# Patient Record
Sex: Female | Born: 1945 | Race: White | Hispanic: No | Marital: Married | Smoking: Never smoker
Health system: Southern US, Community
[De-identification: ages and names within clinical notes are randomized; demographics above are authoritative.]

## PROBLEM LIST (undated history)

## (undated) DIAGNOSIS — Z8719 Personal history of other diseases of the digestive system: Secondary | ICD-10-CM

## (undated) DIAGNOSIS — N183 Chronic kidney disease, stage 3 unspecified: Secondary | ICD-10-CM

## (undated) DIAGNOSIS — I4510 Unspecified right bundle-branch block: Secondary | ICD-10-CM

## (undated) DIAGNOSIS — K5900 Constipation, unspecified: Secondary | ICD-10-CM

## (undated) DIAGNOSIS — S72009A Fracture of unspecified part of neck of unspecified femur, initial encounter for closed fracture: Secondary | ICD-10-CM

## (undated) DIAGNOSIS — Z8639 Personal history of other endocrine, nutritional and metabolic disease: Secondary | ICD-10-CM

## (undated) DIAGNOSIS — F411 Generalized anxiety disorder: Secondary | ICD-10-CM

## (undated) DIAGNOSIS — I10 Essential (primary) hypertension: Secondary | ICD-10-CM

## (undated) DIAGNOSIS — I444 Left anterior fascicular block: Secondary | ICD-10-CM

## (undated) DIAGNOSIS — M1990 Unspecified osteoarthritis, unspecified site: Secondary | ICD-10-CM

## (undated) DIAGNOSIS — H269 Unspecified cataract: Secondary | ICD-10-CM

## (undated) DIAGNOSIS — Z860100 Personal history of colon polyps, unspecified: Secondary | ICD-10-CM

## (undated) DIAGNOSIS — T8859XA Other complications of anesthesia, initial encounter: Secondary | ICD-10-CM

## (undated) DIAGNOSIS — E11319 Type 2 diabetes mellitus with unspecified diabetic retinopathy without macular edema: Secondary | ICD-10-CM

## (undated) DIAGNOSIS — E1143 Type 2 diabetes mellitus with diabetic autonomic (poly)neuropathy: Secondary | ICD-10-CM

## (undated) DIAGNOSIS — I2510 Atherosclerotic heart disease of native coronary artery without angina pectoris: Secondary | ICD-10-CM

## (undated) DIAGNOSIS — E785 Hyperlipidemia, unspecified: Secondary | ICD-10-CM

## (undated) DIAGNOSIS — D631 Anemia in chronic kidney disease: Secondary | ICD-10-CM

## (undated) DIAGNOSIS — N189 Chronic kidney disease, unspecified: Secondary | ICD-10-CM

## (undated) DIAGNOSIS — E039 Hypothyroidism, unspecified: Secondary | ICD-10-CM

## (undated) DIAGNOSIS — K3184 Gastroparesis: Secondary | ICD-10-CM

## (undated) DIAGNOSIS — L89159 Pressure ulcer of sacral region, unspecified stage: Secondary | ICD-10-CM

## (undated) DIAGNOSIS — K603 Anal fistula, unspecified: Secondary | ICD-10-CM

## (undated) DIAGNOSIS — T4145XA Adverse effect of unspecified anesthetic, initial encounter: Secondary | ICD-10-CM

## (undated) DIAGNOSIS — E119 Type 2 diabetes mellitus without complications: Secondary | ICD-10-CM

## (undated) DIAGNOSIS — M1A3791 Chronic gout due to renal impairment, unspecified ankle and foot, with tophus (tophi): Secondary | ICD-10-CM

## (undated) DIAGNOSIS — K5730 Diverticulosis of large intestine without perforation or abscess without bleeding: Secondary | ICD-10-CM

## (undated) DIAGNOSIS — F419 Anxiety disorder, unspecified: Secondary | ICD-10-CM

## (undated) DIAGNOSIS — K219 Gastro-esophageal reflux disease without esophagitis: Secondary | ICD-10-CM

## (undated) HISTORY — DX: Hyperlipidemia, unspecified: E78.5

## (undated) HISTORY — DX: Chronic kidney disease, stage 3 unspecified: N18.30

## (undated) HISTORY — PX: RETINOPATHY SURGERY: SHX765

## (undated) HISTORY — PX: COLONOSCOPY W/ POLYPECTOMY: SHX1380

## (undated) HISTORY — PX: ESOPHAGOGASTRODUODENOSCOPY: SHX1529

## (undated) HISTORY — DX: Atherosclerotic heart disease of native coronary artery without angina pectoris: I25.10

## (undated) HISTORY — DX: Gastro-esophageal reflux disease without esophagitis: K21.9

## (undated) HISTORY — DX: Unspecified cataract: H26.9

## (undated) HISTORY — DX: Essential (primary) hypertension: I10

## (undated) HISTORY — DX: Chronic kidney disease, stage 3 (moderate): N18.3

## (undated) HISTORY — DX: Generalized anxiety disorder: F41.1

## (undated) HISTORY — DX: Fracture of unspecified part of neck of unspecified femur, initial encounter for closed fracture: S72.009A

---

## 1993-05-24 HISTORY — PX: CATARACT EXTRACTION W/ INTRAOCULAR LENS  IMPLANT, BILATERAL: SHX1307

## 2001-05-18 ENCOUNTER — Encounter: Payer: Self-pay | Admitting: Family Medicine

## 2001-05-18 ENCOUNTER — Inpatient Hospital Stay (HOSPITAL_COMMUNITY): Admission: EM | Admit: 2001-05-18 | Discharge: 2001-05-20 | Payer: Self-pay | Admitting: Emergency Medicine

## 2001-05-29 ENCOUNTER — Encounter: Admission: RE | Admit: 2001-05-29 | Discharge: 2001-05-29 | Payer: Self-pay | Admitting: Sports Medicine

## 2003-07-17 ENCOUNTER — Emergency Department (HOSPITAL_COMMUNITY): Admission: EM | Admit: 2003-07-17 | Discharge: 2003-07-17 | Payer: Self-pay | Admitting: Family Medicine

## 2004-12-27 ENCOUNTER — Emergency Department (HOSPITAL_COMMUNITY): Admission: EM | Admit: 2004-12-27 | Discharge: 2004-12-27 | Payer: Self-pay | Admitting: Dietician

## 2004-12-29 ENCOUNTER — Inpatient Hospital Stay (HOSPITAL_COMMUNITY): Admission: EM | Admit: 2004-12-29 | Discharge: 2005-01-04 | Payer: Self-pay | Admitting: Emergency Medicine

## 2004-12-30 ENCOUNTER — Ambulatory Visit: Payer: Self-pay | Admitting: Internal Medicine

## 2004-12-30 HISTORY — PX: CARDIOVASCULAR STRESS TEST: SHX262

## 2005-02-02 ENCOUNTER — Ambulatory Visit: Payer: Self-pay | Admitting: Internal Medicine

## 2005-03-16 ENCOUNTER — Other Ambulatory Visit: Admission: RE | Admit: 2005-03-16 | Discharge: 2005-03-16 | Payer: Self-pay | Admitting: Family Medicine

## 2005-04-07 ENCOUNTER — Ambulatory Visit: Payer: Self-pay | Admitting: Cardiology

## 2005-07-09 ENCOUNTER — Encounter: Admission: RE | Admit: 2005-07-09 | Discharge: 2005-07-09 | Payer: Self-pay | Admitting: Nephrology

## 2005-07-19 ENCOUNTER — Ambulatory Visit: Payer: Self-pay | Admitting: Internal Medicine

## 2005-07-19 ENCOUNTER — Inpatient Hospital Stay (HOSPITAL_COMMUNITY): Admission: EM | Admit: 2005-07-19 | Discharge: 2005-07-21 | Payer: Self-pay | Admitting: Emergency Medicine

## 2005-07-19 ENCOUNTER — Encounter: Payer: Self-pay | Admitting: Internal Medicine

## 2005-07-28 ENCOUNTER — Emergency Department (HOSPITAL_COMMUNITY): Admission: EM | Admit: 2005-07-28 | Discharge: 2005-07-28 | Payer: Self-pay | Admitting: Emergency Medicine

## 2006-05-05 ENCOUNTER — Encounter (HOSPITAL_COMMUNITY): Admission: RE | Admit: 2006-05-05 | Discharge: 2006-08-03 | Payer: Self-pay | Admitting: *Deleted

## 2006-05-24 HISTORY — PX: COLONOSCOPY: SHX174

## 2006-08-23 ENCOUNTER — Encounter (HOSPITAL_COMMUNITY): Admission: RE | Admit: 2006-08-23 | Discharge: 2006-11-21 | Payer: Self-pay | Admitting: Nephrology

## 2006-09-22 ENCOUNTER — Ambulatory Visit: Payer: Self-pay | Admitting: Internal Medicine

## 2006-10-19 ENCOUNTER — Ambulatory Visit: Payer: Self-pay | Admitting: Internal Medicine

## 2006-11-17 ENCOUNTER — Ambulatory Visit: Payer: Self-pay | Admitting: Internal Medicine

## 2006-11-17 DIAGNOSIS — D126 Benign neoplasm of colon, unspecified: Secondary | ICD-10-CM

## 2006-11-17 DIAGNOSIS — K5730 Diverticulosis of large intestine without perforation or abscess without bleeding: Secondary | ICD-10-CM | POA: Insufficient documentation

## 2006-11-18 ENCOUNTER — Encounter: Payer: Self-pay | Admitting: Internal Medicine

## 2006-12-09 ENCOUNTER — Encounter (HOSPITAL_COMMUNITY): Admission: RE | Admit: 2006-12-09 | Discharge: 2007-03-09 | Payer: Self-pay | Admitting: Nephrology

## 2007-03-24 ENCOUNTER — Encounter (HOSPITAL_COMMUNITY): Admission: RE | Admit: 2007-03-24 | Discharge: 2007-06-22 | Payer: Self-pay | Admitting: Nephrology

## 2007-07-11 ENCOUNTER — Encounter (HOSPITAL_COMMUNITY): Admission: RE | Admit: 2007-07-11 | Discharge: 2007-10-09 | Payer: Self-pay | Admitting: Nephrology

## 2007-10-24 ENCOUNTER — Encounter (HOSPITAL_COMMUNITY): Admission: RE | Admit: 2007-10-24 | Discharge: 2008-01-22 | Payer: Self-pay | Admitting: Nephrology

## 2007-10-28 DIAGNOSIS — I152 Hypertension secondary to endocrine disorders: Secondary | ICD-10-CM | POA: Insufficient documentation

## 2007-10-28 DIAGNOSIS — E119 Type 2 diabetes mellitus without complications: Secondary | ICD-10-CM

## 2007-10-28 DIAGNOSIS — F411 Generalized anxiety disorder: Secondary | ICD-10-CM | POA: Insufficient documentation

## 2007-10-28 DIAGNOSIS — E785 Hyperlipidemia, unspecified: Secondary | ICD-10-CM

## 2007-10-28 DIAGNOSIS — I10 Essential (primary) hypertension: Secondary | ICD-10-CM | POA: Insufficient documentation

## 2007-10-28 DIAGNOSIS — E11319 Type 2 diabetes mellitus with unspecified diabetic retinopathy without macular edema: Secondary | ICD-10-CM

## 2007-10-28 DIAGNOSIS — K922 Gastrointestinal hemorrhage, unspecified: Secondary | ICD-10-CM | POA: Insufficient documentation

## 2007-10-28 DIAGNOSIS — F329 Major depressive disorder, single episode, unspecified: Secondary | ICD-10-CM

## 2007-10-28 DIAGNOSIS — E1159 Type 2 diabetes mellitus with other circulatory complications: Secondary | ICD-10-CM

## 2007-10-28 DIAGNOSIS — Z8719 Personal history of other diseases of the digestive system: Secondary | ICD-10-CM

## 2007-10-28 DIAGNOSIS — E1169 Type 2 diabetes mellitus with other specified complication: Secondary | ICD-10-CM

## 2007-10-28 DIAGNOSIS — I2510 Atherosclerotic heart disease of native coronary artery without angina pectoris: Secondary | ICD-10-CM | POA: Insufficient documentation

## 2007-10-28 DIAGNOSIS — N259 Disorder resulting from impaired renal tubular function, unspecified: Secondary | ICD-10-CM | POA: Insufficient documentation

## 2007-12-28 ENCOUNTER — Inpatient Hospital Stay (HOSPITAL_COMMUNITY): Admission: EM | Admit: 2007-12-28 | Discharge: 2007-12-30 | Payer: Self-pay | Admitting: Emergency Medicine

## 2007-12-28 ENCOUNTER — Ambulatory Visit: Payer: Self-pay | Admitting: Family Medicine

## 2008-01-21 ENCOUNTER — Emergency Department (HOSPITAL_COMMUNITY): Admission: EM | Admit: 2008-01-21 | Discharge: 2008-01-21 | Payer: Self-pay | Admitting: Emergency Medicine

## 2008-01-22 ENCOUNTER — Telehealth: Payer: Self-pay | Admitting: Internal Medicine

## 2008-01-23 ENCOUNTER — Encounter: Payer: Self-pay | Admitting: Gastroenterology

## 2008-01-23 ENCOUNTER — Inpatient Hospital Stay (HOSPITAL_COMMUNITY): Admission: EM | Admit: 2008-01-23 | Discharge: 2008-01-26 | Payer: Self-pay | Admitting: Emergency Medicine

## 2008-01-23 ENCOUNTER — Ambulatory Visit: Payer: Self-pay | Admitting: Gastroenterology

## 2008-02-12 ENCOUNTER — Encounter (HOSPITAL_COMMUNITY): Admission: RE | Admit: 2008-02-12 | Discharge: 2008-05-12 | Payer: Self-pay | Admitting: Nephrology

## 2008-02-14 ENCOUNTER — Ambulatory Visit (HOSPITAL_COMMUNITY): Admission: RE | Admit: 2008-02-14 | Discharge: 2008-02-14 | Payer: Self-pay | Admitting: Family Medicine

## 2008-03-13 ENCOUNTER — Encounter (HOSPITAL_COMMUNITY): Admission: RE | Admit: 2008-03-13 | Discharge: 2008-05-23 | Payer: Self-pay | Admitting: Family Medicine

## 2008-04-08 ENCOUNTER — Ambulatory Visit: Payer: Self-pay

## 2008-05-21 ENCOUNTER — Encounter (HOSPITAL_COMMUNITY): Admission: RE | Admit: 2008-05-21 | Discharge: 2008-05-23 | Payer: Self-pay | Admitting: Nephrology

## 2008-05-24 ENCOUNTER — Encounter (HOSPITAL_COMMUNITY): Admission: RE | Admit: 2008-05-24 | Discharge: 2008-08-22 | Payer: Self-pay | Admitting: Nephrology

## 2008-07-29 ENCOUNTER — Ambulatory Visit (HOSPITAL_COMMUNITY): Admission: RE | Admit: 2008-07-29 | Discharge: 2008-07-29 | Payer: Self-pay | Admitting: Family Medicine

## 2008-09-06 LAB — HM DEXA SCAN

## 2008-09-17 ENCOUNTER — Encounter (HOSPITAL_COMMUNITY): Admission: RE | Admit: 2008-09-17 | Discharge: 2008-12-16 | Payer: Self-pay | Admitting: Nephrology

## 2008-10-08 ENCOUNTER — Emergency Department (HOSPITAL_COMMUNITY): Admission: EM | Admit: 2008-10-08 | Discharge: 2008-10-08 | Payer: Self-pay | Admitting: Emergency Medicine

## 2009-01-07 ENCOUNTER — Encounter (HOSPITAL_COMMUNITY): Admission: RE | Admit: 2009-01-07 | Discharge: 2009-04-07 | Payer: Self-pay | Admitting: Nephrology

## 2009-04-08 ENCOUNTER — Encounter (HOSPITAL_COMMUNITY): Admission: RE | Admit: 2009-04-08 | Discharge: 2009-07-07 | Payer: Self-pay | Admitting: Nephrology

## 2009-07-08 ENCOUNTER — Encounter (HOSPITAL_COMMUNITY): Admission: RE | Admit: 2009-07-08 | Discharge: 2009-10-06 | Payer: Self-pay | Admitting: Nephrology

## 2009-10-14 ENCOUNTER — Encounter (HOSPITAL_COMMUNITY): Admission: RE | Admit: 2009-10-14 | Discharge: 2010-01-12 | Payer: Self-pay | Admitting: Nephrology

## 2010-01-27 ENCOUNTER — Encounter (HOSPITAL_COMMUNITY): Admission: RE | Admit: 2010-01-27 | Discharge: 2010-04-27 | Payer: Self-pay | Admitting: Nephrology

## 2010-05-12 ENCOUNTER — Encounter (HOSPITAL_COMMUNITY)
Admission: RE | Admit: 2010-05-12 | Discharge: 2010-06-23 | Payer: Self-pay | Source: Home / Self Care | Attending: Nephrology | Admitting: Nephrology

## 2010-06-08 LAB — POCT HEMOGLOBIN-HEMACUE: Hemoglobin: 11.1 g/dL — ABNORMAL LOW (ref 12.0–15.0)

## 2010-06-08 LAB — IRON AND TIBC
Iron: 53 ug/dL (ref 42–135)
Saturation Ratios: 20 % (ref 20–55)
TIBC: 269 ug/dL (ref 250–470)
UIBC: 216 ug/dL

## 2010-06-08 LAB — FERRITIN: Ferritin: 222 ng/mL (ref 10–291)

## 2010-07-14 ENCOUNTER — Encounter (HOSPITAL_COMMUNITY)
Admission: RE | Admit: 2010-07-14 | Discharge: 2010-07-14 | Disposition: A | Discharge status: Home / Self Care | Payer: Medicare Other | Source: Ambulatory Visit | Attending: Nephrology | Admitting: Nephrology

## 2010-07-14 ENCOUNTER — Other Ambulatory Visit: Payer: Self-pay

## 2010-07-14 DIAGNOSIS — D638 Anemia in other chronic diseases classified elsewhere: Secondary | ICD-10-CM | POA: Insufficient documentation

## 2010-07-14 DIAGNOSIS — N183 Chronic kidney disease, stage 3 unspecified: Secondary | ICD-10-CM | POA: Insufficient documentation

## 2010-08-03 LAB — POCT HEMOGLOBIN-HEMACUE: Hemoglobin: 11.4 g/dL — ABNORMAL LOW (ref 12.0–15.0)

## 2010-08-04 ENCOUNTER — Other Ambulatory Visit: Payer: Self-pay | Admitting: Nephrology

## 2010-08-04 ENCOUNTER — Other Ambulatory Visit: Payer: Self-pay

## 2010-08-04 ENCOUNTER — Encounter (HOSPITAL_COMMUNITY): Payer: Medicare Other | Attending: Nephrology

## 2010-08-04 DIAGNOSIS — N183 Chronic kidney disease, stage 3 unspecified: Secondary | ICD-10-CM | POA: Insufficient documentation

## 2010-08-04 DIAGNOSIS — D638 Anemia in other chronic diseases classified elsewhere: Secondary | ICD-10-CM | POA: Insufficient documentation

## 2010-08-04 LAB — IRON AND TIBC
Iron: 69 ug/dL (ref 42–135)
Saturation Ratios: 22 % (ref 20–55)
Saturation Ratios: 20 % (ref 20–55)
UIBC: 243 ug/dL
UIBC: 243 ug/dL

## 2010-08-05 LAB — POCT HEMOGLOBIN-HEMACUE: Hemoglobin: 10.2 g/dL — ABNORMAL LOW (ref 12.0–15.0)

## 2010-08-06 LAB — POCT HEMOGLOBIN-HEMACUE: Hemoglobin: 10.4 g/dL — ABNORMAL LOW (ref 12.0–15.0)

## 2010-08-06 LAB — IRON AND TIBC: UIBC: 206 ug/dL

## 2010-08-08 LAB — POCT HEMOGLOBIN-HEMACUE: Hemoglobin: 10.1 g/dL — ABNORMAL LOW (ref 12.0–15.0)

## 2010-08-09 LAB — POCT HEMOGLOBIN-HEMACUE: Hemoglobin: 11.8 g/dL — ABNORMAL LOW (ref 12.0–15.0)

## 2010-08-09 LAB — IRON AND TIBC
Iron: 79 ug/dL (ref 42–135)
Saturation Ratios: 25 % (ref 20–55)
TIBC: 306 ug/dL (ref 250–470)
UIBC: 231 ug/dL

## 2010-08-09 LAB — FERRITIN: Ferritin: 64 ng/mL (ref 10–291)

## 2010-08-10 LAB — IRON AND TIBC: Iron: 76 ug/dL (ref 42–135)

## 2010-08-10 LAB — FERRITIN: Ferritin: 267 ng/mL (ref 10–291)

## 2010-08-11 LAB — POCT HEMOGLOBIN-HEMACUE: Hemoglobin: 10.3 g/dL — ABNORMAL LOW (ref 12.0–15.0)

## 2010-08-12 LAB — POCT HEMOGLOBIN-HEMACUE
Hemoglobin: 11.4 g/dL — ABNORMAL LOW (ref 12.0–15.0)
Hemoglobin: 12.3 g/dL (ref 12.0–15.0)

## 2010-08-16 LAB — IRON AND TIBC
Iron: 78 ug/dL (ref 42–135)
UIBC: 228 ug/dL

## 2010-08-16 LAB — FERRITIN: Ferritin: 185 ng/mL (ref 10–291)

## 2010-08-16 LAB — POCT HEMOGLOBIN-HEMACUE: Hemoglobin: 11.7 g/dL — ABNORMAL LOW (ref 12.0–15.0)

## 2010-08-24 LAB — POCT HEMOGLOBIN-HEMACUE: Hemoglobin: 11.1 g/dL — ABNORMAL LOW (ref 12.0–15.0)

## 2010-08-25 ENCOUNTER — Other Ambulatory Visit: Payer: Self-pay | Admitting: Nephrology

## 2010-08-25 ENCOUNTER — Encounter (HOSPITAL_COMMUNITY): Payer: Medicare Other | Attending: Nephrology

## 2010-08-25 DIAGNOSIS — D638 Anemia in other chronic diseases classified elsewhere: Secondary | ICD-10-CM | POA: Insufficient documentation

## 2010-08-25 DIAGNOSIS — N183 Chronic kidney disease, stage 3 unspecified: Secondary | ICD-10-CM | POA: Insufficient documentation

## 2010-08-25 LAB — POCT HEMOGLOBIN-HEMACUE: Hemoglobin: 10.7 g/dL — ABNORMAL LOW (ref 12.0–15.0)

## 2010-08-27 LAB — IRON AND TIBC: TIBC: 327 ug/dL (ref 250–470)

## 2010-08-27 LAB — POCT HEMOGLOBIN-HEMACUE
Hemoglobin: 10.9 g/dL — ABNORMAL LOW (ref 12.0–15.0)
Hemoglobin: 10.7 g/dL — ABNORMAL LOW (ref 12.0–15.0)

## 2010-08-27 LAB — FERRITIN: Ferritin: 111 ng/mL (ref 10–291)

## 2010-08-29 LAB — IRON AND TIBC
Iron: 73 ug/dL (ref 42–135)
TIBC: 321 ug/dL (ref 250–470)

## 2010-08-29 LAB — POCT HEMOGLOBIN-HEMACUE: Hemoglobin: 10.6 g/dL — ABNORMAL LOW (ref 12.0–15.0)

## 2010-08-30 LAB — IRON AND TIBC
Iron: 70 ug/dL (ref 42–135)
Saturation Ratios: 22 % (ref 20–55)
UIBC: 251 ug/dL

## 2010-08-30 LAB — FERRITIN: Ferritin: 108 ng/mL (ref 10–291)

## 2010-08-31 LAB — POCT HEMOGLOBIN-HEMACUE: Hemoglobin: 10.9 g/dL — ABNORMAL LOW (ref 12.0–15.0)

## 2010-09-01 LAB — URINALYSIS, ROUTINE W REFLEX MICROSCOPIC
Glucose, UA: NEGATIVE mg/dL
Nitrite: NEGATIVE
Specific Gravity, Urine: 1.014 (ref 1.005–1.030)
pH: 6.5 (ref 5.0–8.0)

## 2010-09-01 LAB — URINE CULTURE: Colony Count: >=100000

## 2010-09-01 LAB — BASIC METABOLIC PANEL
CO2: 27 mEq/L (ref 19–32)
Calcium: 9.7 mg/dL (ref 8.4–10.5)
Chloride: 107 mEq/L (ref 96–112)
Creatinine, Ser: 1.84 mg/dL — ABNORMAL HIGH (ref 0.4–1.2)
Glucose, Bld: 70 mg/dL (ref 70–99)

## 2010-09-01 LAB — GLUCOSE, CAPILLARY

## 2010-09-01 LAB — URINE MICROSCOPIC-ADD ON

## 2010-09-02 LAB — IRON AND TIBC
Saturation Ratios: 21 % (ref 20–55)
TIBC: 378 ug/dL (ref 250–470)
UIBC: 298 ug/dL

## 2010-09-03 LAB — POCT HEMOGLOBIN-HEMACUE: Hemoglobin: 11.7 g/dL — ABNORMAL LOW (ref 12.0–15.0)

## 2010-09-07 LAB — IRON AND TIBC: Iron: 81 ug/dL (ref 42–135)

## 2010-09-07 LAB — POCT HEMOGLOBIN-HEMACUE: Hemoglobin: 12.5 g/dL (ref 12.0–15.0)

## 2010-09-08 LAB — POCT HEMOGLOBIN-HEMACUE: Hemoglobin: 11.8 g/dL — ABNORMAL LOW (ref 12.0–15.0)

## 2010-09-14 ENCOUNTER — Encounter (HOSPITAL_COMMUNITY): Payer: Medicare Other

## 2010-09-14 ENCOUNTER — Other Ambulatory Visit: Payer: Self-pay | Admitting: Nephrology

## 2010-09-15 ENCOUNTER — Encounter (HOSPITAL_COMMUNITY): Payer: Medicare Other

## 2010-09-15 LAB — POCT HEMOGLOBIN-HEMACUE: Hemoglobin: 11.8 g/dL — ABNORMAL LOW (ref 12.0–15.0)

## 2010-10-05 ENCOUNTER — Other Ambulatory Visit: Payer: Self-pay | Admitting: Nephrology

## 2010-10-05 ENCOUNTER — Encounter (HOSPITAL_COMMUNITY): Payer: Medicare Other | Attending: Nephrology

## 2010-10-05 DIAGNOSIS — N183 Chronic kidney disease, stage 3 unspecified: Secondary | ICD-10-CM | POA: Insufficient documentation

## 2010-10-05 DIAGNOSIS — D638 Anemia in other chronic diseases classified elsewhere: Secondary | ICD-10-CM | POA: Insufficient documentation

## 2010-10-05 LAB — IRON AND TIBC
Iron: 54 ug/dL (ref 42–135)
Saturation Ratios: 18 % — ABNORMAL LOW (ref 20–55)
UIBC: 238 ug/dL

## 2010-10-06 NOTE — Discharge Summary (Signed)
[name], [name] NO.:  [id number]   MEDICAL RECORD NO.:  [id number]          PATIENT TYPE:  INP   LOCATION:  [id number]                         FACILITY:  [facility]   PHYSICIAN:  [name], MDDATE OF BIRTH:  [birth year]   DATE OF ADMISSION:  12/28/2007  DATE OF DISCHARGE:  12/31/2007                               DISCHARGE SUMMARY   PRIMARY CARE PHYSICIAN:  Dr. Elenore Rota [name]/[name], Nurse  Practitioner.   DISCHARGE DISPOSITION:  Home.   FINAL DISCHARGE DIAGNOSES:  1. Upper gastrointestinal bleed.  2. Nausea and vomiting.  3. History of diabetes.  4. Hypertension, poorly controlled.  5. Chronic kidney disease, stage III.  6. History of esophagitis.  7. 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  8. History of diverticulosis.  9. History of anxiety.  8  Urinary tract infection.   DISCHARGE MEDICATIONS:  1. Glimepiride 2 mg p.o.  2. Lorazepam  0.5 mg p.o. q. 8 hours p.r.n.  3. Actos 30 mg p.o. daily.  4. Metoprolol 50 mg p.o. daily.  5. Calcitriol 0.25 mg p.o. daily.  6. Lasix 40 mg p.o. daily.  7. Vytorin 10/40 mg p.o. daily.  8. Clonidine 0.2 mg p.o. b.i.d.  9. Ciprofloxacin 500 mg p.o. b.i.d. x3.   CONSULTANTS:  [facility].   PROCEDURES:  None.   DIAGNOSTIC STUDIES:  Abdominal x-ray done on admission which shows no  acute cardiopulmonary processes.  Fecal distention of the colon.  Mild  gaseous distention of the distal transverse colon.  No pneumoperitoneum  or opaque calculus seen.   CODE STATUS:  Full code.   ALLERGIES:  1. ASPIRIN  2. VIOXX.  3. CODEINE  4. TYLENOL.   CHIEF COMPLAINT:  Nausea and vomiting.   HISTORY OF PRESENT ILLNESS:  Please see the H&P dictated by Dr.  Netty [name] for details of the HPI.  However, in short, the patient  started having emesis after she took her Micardis.  She knows that she  was having coffee-grounds, but no bright red blood per rectum.  The  patient had 20 or  more episodes of emesis and was brought to the  emergency room by her family.   HOSPITAL COURSE:  1. GI bleed:  The patient was given bowel rest.  The patient had no      bright red blood in her emesis, only coffee-ground emesis.  The      patient was given bowel rest and started on Protonix and given      supportive IV fluids.  Gastroenterology saw the patient and      assessed that the patient did not need to have any further      intervention.  The patient had her diet advanced from a clear      liquid to a carb modified diet, which she tolerated without any      difficulty.  2. Hypertension:  The patient had some difficulty with her blood      pressure while her blood pressure medications were initially held      in the hospital.  However, after  initial bowel rest, her blood      pressure medications were resumed and the patient's blood pressure      came under control.  3. Diabetes type 2:  The patient was continued on her usual      medications after she was tolerating a diet and her blood sugars      were under good control.  Otherwise , the patient has remained stable today.   PHYSICAL EXAMINATION:  VITAL SIGNS:  Stable:  Blood pressure is 150/86,  heart rate 60, temperature 98.1, respiratory rate 16, O2 sats are 99% on  room air, blood sugars are ranging from 102 to 117.  HEENT EXAMINATION:  Atraumatic.  Pupils equally round and reactive to  light and accommodation.  Oropharynx is moist.  RESPIRATORY EXAMINATION:  She is clear to auscultation.  ABDOMINAL EXAMINATION:  She is obese, soft, nontender, nondistended.  No  masses or hepatosplenomegaly noted.   DIETARY RESTRICTIONS:  The patient should be on a carb modified 2 gram  sodium diet, low cholesterol diet.   PHYSICAL RESTRICTIONS:  None.   FOLLOW UP:  The patient is to follow up with her primary care physician  in 1 week.  The patient is also to follow up with her nephrologist for  an appointment already  scheduled.      [name] Gamer, MD  Electronically Signed    MAM/MEDQ  D:  12/30/2007  T:  12/30/2007  Job:  [id number]   cc:   [name], M.D.  [name] Mayer, MD,FACG  [name], M.D.

## 2010-10-06 NOTE — Assessment & Plan Note (Signed)
[facility] OFFICE NOTE   NAME:[name], [name]                      MRN:          [id number]  DATE:10/19/2006                            DOB:          May 29, 1945    [name] is a 65 year old white female with a history of rectal  abscess, positive family history of colon cancer in her father, who we  saw two years ago at Dr. Tawanna Sat recommendation for colonoscopy.  At  that time, she had a healing perirectal abscess, and we decided to wait  until the abscess healed.  She has since then had an episode of  intractable nausea and vomiting, attributed most likely to diabetes and  gastroparesis and was hospitalized and had an upper endoscopy on an  emergency basis by Dr. Collene Mares.  She has done quite well now, the rectal  abscess has healed up, and she is interested in having a colonoscopy.   MEDICATIONS:  1. Vytorin 10/40 1 p.o. daily.  2. Clonidine 0.2 mg p.o. b.i.d.  3. Metoprolol 50 mg p.o. daily.  4. Actos 30 mg p.o. daily.  5. Glimepiride 2 mg 1/2 tablet daily.  6. Equate stool softener.  7. MiraLax p.r.n.   PHYSICAL EXAMINATION:  VITAL SIGNS:  Blood pressure 154/90, pulse 68.  Weight 193 pounds.  GENERAL:  Patient was alert and oriented in no distress.  She was  accompanied by her sister.  LUNGS:  Clear to auscultation.  COR:  Normal S1, normal S2.  ABDOMEN:  Protuberant, obese, nontender.  RECTAL:  A completely healed perirectal area with normal rectal tone.   IMPRESSION:  A 65 year old white female with a positive family history  of colon cancer in a direct relative and a personal history of rectal  abscess.  She also has a history of anemia requiring iron infusions by  Dr. Mercy Moore, a history of renal insufficiency, and diabetes.   PLAN:  Colonoscopy is scheduled for the end of June.  Her sister will  bring her.  We discussed prep.  Since she cannot drink a large volume of  laxative, she opted  for OsmoPrep with the water in between.  We have  explained it to her and gave her Phenergan 25 mg to use p.r.n. nausea,  which she could use during the prep.  Patient will discontinue her  diabetic medications on the day of the procedure.    Lowella Bandy. Olevia Perches, MD  Electronically Signed   DMB/MedQ  DD: 10/19/2006  DT: 10/20/2006  Job #: [id number]   cc:   Chipper Herb, M.D.

## 2010-10-06 NOTE — H&P (Signed)
NAMEPERIS, SHAFIQ NO.:  [id number]   MEDICAL RECORD NO.:  [id number]          PATIENT TYPE:  INP   LOCATION:  [id number]                         FACILITY:  [facility]   PHYSICIAN:  Dalbert Mayotte, MD        DATE OF BIRTH:  [birth year]   DATE OF ADMISSION:  12/28/2007  DATE OF DISCHARGE:                              HISTORY & PHYSICAL   PRIMARY CARE Denee Boeder:  Chevis Pretty, [facility].   CHIEF COMPLAINT:  Nausea and vomiting.   HISTORY OF PRESENT ILLNESS:  Ms. Viswanathan is a 65 year old white female  with past medical history significant for Mallory-Weiss tear,  esophagitis, and colon polyps followed by colonoscopy in 2008, who  presents with hematemesis.  She saw her nephrologist Dr. Mercy Moore who  put her on Micardis on Tuesday.  She became nauseated on Wednesday and  discontinued her medication after 2 doses.  She began vomiting on  Thursday at 6 a.m.  She vomited more than 20 times; it was nonbilious,  but she describes the vomitus as coffee brown.  She describes having  similar experience several years ago with enalapril.  She has not had  any medication today and has not taken anything to relieve the nausea  and vomiting.  Of note, she has not had her bowel movements since  Tuesday.  This is typical for her.  She does not describe any fevers,  chills, sweats, or weight change.  She has not had any diarrhea, bright  red blood per rectum, or abdominal pain.  Her ED course; she received  Protonix 40 mg, Zofran 8 mg IV, Reglan 10 mg x1, and Reglan 10 mg IM.   PAST MEDICAL HISTORY:  1. Coronary artery disease.  2. Diabetes mellitus with associated retinopathy.  3. Blood glucose is noted to be usually 93 to 97.  4. GERD.  5. Hypertension, controlled usually in 120s/80s.  6. Colonoscopy, July 2008 showed polyp, 2-mm diminutive sessile      polyps.  7. GI bleed.  8. Esophagitis.  9. Mallory-Weiss tear 5-6 years ago.  10.Diverticulosis.  11.Anxiety.  12.Renal insufficiency.   PAST SURGICAL HISTORY:  Retina surgery numerous times and surgery of  rectal abscess.   SOCIAL HISTORY:  She lives with husband in [place].  She does not  describe any history or use of tobacco, alcohol, or illicit drugs.   FAMILY HISTORY:  Her mother had diabetes mellitus and myocardial  infarction.  Father had colon cancer at age 46 and diabetes mellitus and  myocardial infarction.   MEDICATIONS:  She was on,  1. Glimepiride 2 mg daily.  2. Calcitriol 0.25 mcg daily.  3. Lorazepam 0.5 mg 1 or 2 tabs p.r.n. anxiety.  4. Lasix 40 mg daily.  5. Micardis 20 mg daily.  Micardis started on December 26, 2007.  6. Vytorin 10/40 daily.  7. Toprol ER 50 mg daily.  8. Actos 30 mg daily.   ALLERGIES:  VIOXX.   PHYSICAL EXAMINATION:  VITAL SIGNS:  Temperature max 99.8, pulse 134,  respirations 19, blood pressure 200/96, and pulse ox 99%.  GENERAL:  Alert and oriented in mild distress and vomiting brown mucoid  substance.  HEENT:  Pupils equal, round, and reactive to light.  CARDIOVASCULAR:  Regular rate and rhythm.  No murmurs, rubs, or gallops.  S1 and S2.  LUNGS:  Clear to auscultation bilaterally.  ABDOMEN:  Nontender, nondistended, absent bowel sounds.  BACK:  Without CVA tenderness.  EXTREMITIES:  Without cyanosis, clubbing, or edema.  A 2+ posterior  tibialis pulses bilaterally.   LABS AND STUDIES:  Fecal occult blood test positive.  Point-of-care  cardiac enzymes within normal limits x1.  Lipase 18, BUN 18, creatinine  1.4, AST 29, ALT 15, alk phos 75, white blood cell count 7.7, hemoglobin  13.6, and hematocrit 40.9.  Abdominal series showed fecal distention of  colon and gaseous distention of transverse colon.   ASSESSMENT:  Ms. Stadtmiller is a 65 year old white female with hematemesis.   PROBLEM LIST:  1. Hematemesis could be related to an ulcer past Mallory-Weiss tear      with extensive upper gastrointestinal disease.  Started Protonix      IV,  started Zofran IV.  We will evaluate with CT when fluid status      was improved and improved kidney function.  CBC in the morning, but      she is not acutely anemic; n.p.o. for possible scope and irritable      stomach.  Gastrointestinal consult in the morning.  2. Hypertension; was unable to take antihypertensives today and      arrived with hypertension.  She was given labetalol 40 mg IV x1.      Metoprolol IV start 5 mg IV q.6 h.  We will hold      hydrochlorothiazide, Micardis, and Lasix.  3. Renal insufficiency.  The patient is in acute renal failure      secondary to hypervolemia with no known baseline creatinine.  Start      normal saline 200 mL per hour x2 hours, then 125 mL per hour      maintenance.  BMET      warranting.  4. Diabetes mellitus.  Held oral antihyperglycemics on SSI moderate      scale.  Check A1c in the morning.  5. Disposition.  Transferring to the [facility] Service Team D in the      morning.      Dion Body, MD  Electronically Signed      Dalbert Mayotte, MD  Electronically Signed    KL/MEDQ  D:  12/28/2007  T:  12/29/2007  Job:  [id number]

## 2010-10-06 NOTE — H&P (Signed)
[name], [name] NO.:  [id number]   MEDICAL RECORD NO.:  [id number]          PATIENT TYPE:  INP   LOCATION:  [id number]                         FACILITY:  [facility]   PHYSICIAN:  Karlyn Agee, M.D. DATE OF BIRTH:  12-18-1945   DATE OF ADMISSION:  01/23/2008  DATE OF DISCHARGE:                              HISTORY & PHYSICAL   PRIMARY CARE PHYSICIAN:  Chipper Herb, M.D. at [facility].   CHIEF COMPLAINT:  Persistent vomiting.   HISTORY OF PRESENT ILLNESS:  This is a 65 year old-Caucasian lady with a  history of diabetes, chronic renal insufficiency, coronary artery  disease and hypertension with multiple admission for intractable  vomiting who was discharged from this facility on December 30, 2007 for  treatment for upper GI bleed, nausea and vomiting, which was felt to be  related to Micardis.  The patient has been fairly well until yesterday  when vomiting started again for reasons unknown.  She is unaware of any  precipitating factors.  She has been using no nonsteroidal anti-  inflammatory drugs and no alcohol.  She came to the emergency room  yesterday and received Zofran, went home, but her vomiting restarted.  The patient is now having streaks of blood in the vomitus.  The patient  was admitted a few months ago and has had upper endoscopy for a Mallory-  Weiss tear because of this recurrent vomiting.   PAST MEDICAL HISTORY:  1. Hypertension.  2. Diabetes.  3. Chronic kidney disease stage III.  4. Esophagitis.  5. History of upper GI bleed.  6. History of diverticulosis.  7. Anxiety.   MEDICATIONS:  1. Glimepiride 2 mg daily.  2. Lorazepam 0.5 mg every 8 hours p.r.n.  3. Actos 30 mg daily.  4. Metoprolol XL 50 mg daily.  5. Calcitriol 0.25 mg daily.  6. Lasix 40 mg daily.  7. Vytorin 10/40 daily.  8. Clonidine 0.2 mg twice daily.  9. Prilosec p.r.n.   ALLERGIES:  VIOXX.   SOCIAL HISTORY:  Lives with her husband.   Denies tobacco, alcohol or  illicit drug use.   FAMILY HISTORY:  Significant for diabetes, coronary artery disease,  colon cancer.   REVIEW OF SYSTEMS:  Other than noted above, significant only for  constipation.   PHYSICAL EXAMINATION:  GENERAL:  Obese, middle-aged Caucasian lady lying  on a stretcher in obvious distress.  VITAL SIGNS:  Temperature is 97.6, pulse is 114, blood pressures range  between 215/111 and 183/89 while in the emergency room.  She denies any  pain.  She is saturating at 95% on room air.  HEENT:  Pupils are round and equal.  Mucous membranes are pink and  anicteric.  She is somewhat dehydrated.  No cervical lymphadenopathy.  NECK:  No thyromegaly.  CHEST:  Clear to auscultation bilaterally.  CARDIOVASCULAR:  Tachycardic.  ABDOMEN:  Obese and soft.  There is no tenderness.  EXTREMITIES:  Without edema.  CENTRAL NERVOUS SYSTEM:  Cranial nerves II-XII grossly intact.  She has  no focal neurologic deficits.   LABORATORY DATA:  Her CBC is unremarkable.  She is not anemic.  She does  not have leukocytosis.  There is no acetone in her blood.  Her cardiac  enzymes are completely normal.  Serum chemistry is significant for a  sodium of 138, potassium 4.8, chloride 101, CO2 of 22, glucose 280, BUN  20, creatinine 2.12.  Her liver function tests are normal.  Her lipase  is normal.  Urinalysis revealed specific gravity of 1.016.  She has 40  ketones, 100 protein.  Nitrate and leukocyte esterase are negative.  Glucose 250.  Urinalysis is unremarkable.  CT scan of the abdomen and  pelvis revealed a 4 cm low density lesion associated with the left  adrenal gland.  No fluid in the pelvis.  A chronic compression fracture  of L3.  Rectum and sigmoid appear normal.   ASSESSMENT:  1. Intractable vomiting of unclear etiology.  There is a possibility      that this lady has some sort of adrenal tumor associated with this      vomiting; however, she is not a candidate for a  contrasted MRI or a      contrast CT at this time because of her renal function.  2. Hypertension, uncontrolled, probably because she is unable to keep      down her blood pressure medications.  3. Chronic renal failure with slight worsening of her renal function,      probably due to dehydration.  4. Anxiety.  5. Diabetes type 2 uncontrolled.   PLAN:  Will admit this lady for IV fluids and antiemetics.  Will seek to  get better control of her blood sugar and use IV antihypertensive for  better control of her blood pressure.  Although she is tachycardic, will  go ahead and get a CT scan of her head since no obvious cause has been  found for this persistent vomiting.  Other plans as per orders.      Karlyn Agee, M.D.  Electronically Signed     LC/MEDQ  D:  01/23/2008  T:  01/23/2008  Job:  [id number]   cc:   Dr. Hassell Done, nephrologist  Chipper Herb, M.D.  [facility]

## 2010-10-06 NOTE — Discharge Summary (Signed)
[name], [name]               ACCOUNT NO.:  [id number]   MEDICAL RECORD NO.:  [id number]          PATIENT TYPE:  INP   LOCATION:  [id number]                         FACILITY:  [facility]   PHYSICIAN:  Jacquelynn Cree, M.D.   DATE OF BIRTH:  02/02/1946   DATE OF ADMISSION:  01/23/2008  DATE OF DISCHARGE:  01/26/2008                               DISCHARGE SUMMARY   PRIMARY CARE PHYSICIAN:  Chipper Herb, MD   DISCHARGE DIAGNOSES:  1. Intractable nausea and vomiting secondary to gastroparesis.  2. Hypertension.  3. Stage III chronic kidney disease.  4. Diabetes.  5. Anxiety disorder.  6. Dyslipidemia.  7. Hypomagnesemia.  8. Hyponatremia.  9. Hypokalemia.  10.Hematemesis secondary to presumed Mallory-Weiss tear.  11.Diverticulosis.  12.A 4-cm left adrenal lesion, outpatient MRI recommended.  13.Chronic compression fracture, L3.   DISCHARGE MEDICATIONS:  1. Lasix 40 mg daily.  2. Actos 30 mg daily.  3. Vytorin 10/40 daily.  4. Stool softener daily p.r.n.  5. Promethazine 25 mg q.6 h. p.r.n.  6. Metoprolol 50 mg daily.  7. Lorazepam 0.5 mg b.i.d. p.r.n.  8. Protonix 40 mg daily.  9. Calcitriol 0.25 mcg daily.  10.Clonidine 0.2 mg b.i.d.  11.Amaryl 2 mg daily.  12.Reglan 5 mg 30 minutes q. a.c. and h.s.  13.MiraLax 2 tablespoons as needed for constipation.   CONSULTATIONS:  Lowella Bandy. Olevia Perches, MD of Gastroenterology.   PROCEDURES AND DIAGNOSTIC STUDIES:  1. CT scan of the abdomen and pelvis on January 23, 2008, showed no      acute abdominal process.  There was a 4 cm low-density lesion      associated with a left adrenal gland, which could not be      characterized as a benign adenoma on this study.  Outpatient MRI      evaluation of this lesion, which is likely a benign adenoma, is      advised due to its large size.  Perinephric stranding likely      related to chronic renal disease.  There were no acute pelvic      processes and chronic compression fracture at L3.  2. CT  scan of the head on January 23, 2008, showed no acute      intracranial abnormality.  3. Upper endoscopy on January 23, 2008, showed normal proximal      esophagus to the cardia.  There was a polyp noted in the body with      a maximum size of 2 mm.  This area was biopsied.  There was      multiple diminutive nonbleeding polyps in the cardia and fundus.      Normal from the body of the stomach to the second portion of      duodenum.  Pathology on these polyps showed benign fundic gland      polyps.  4. Gastric emptying study on January 24, 2008, showed marked gastric      retention with 82% activity remaining in the stomach at 120 minutes      (normal is less than 30% at 2 hours).   DISCHARGE  LABORATORY VALUES:  Sodium was 137, potassium 3.7, chloride  106, bicarb 22, BUN 16, creatinine 1.31, glucose 94.  White blood cell  count was 9.3, hemoglobin 13.1, hematocrit 38.9, and platelets 221.   HOSPITAL COURSE BY PROBLEM:  1. Intractable nausea and vomiting secondary to gastroparesis.  The      patient was admitted and diagnostic workup was undertaken.      Diagnostic testing confirmed that the patient's symptoms were      likely due to significant gastroparesis.  She was commenced on      therapy with Reglan and given extensive teaching regarding eating      small more frequent meals and avoiding large amounts of uncooked      vegetables and high fiber.  2. Hypertension:  The patient's blood pressure showed some elevation      of her systolic pressures that was probably related to holding her      Lasix in house while she was being worked up.  She can resume her      Lasix at discharge and follow up with her primary care physician to      ensure that her blood pressure is well controlled.  3. Stage III chronic kidney disease:  The patient's creatinine was      stable throughout her hospital stay with a slight bump to 1.3 at      discharge.  If she has not referred to a nephrologist,  we do      recommend outpatient nephrology referral for her advanced kidney      disease.  4. Diabetes:  The patient's blood sugars were well controlled with      combination of Lantus and sliding scale insulin.  She will be      resumed on her usual oral hypoglycemics at discharge.  Her      hemoglobin A1c was 7.8% indicating that she might benefit from      adjustment of her outpatient therapies for better glycemic control.  5. Hypomagnesemia, hyponatremia, hypokalemia:  The patient was      appropriately supplemented and her electrolytes were normal at      discharge.  6. Hematemesis secondary to presumed Mallory-Weiss tear:  The      patient's blood counts remained stable with serial checks.  She has      a known history of Mallory-Weiss tear confirmed by endoscopy in the      past.  Although no active bleeding was found on her upper      endoscopy, it was presumed that her hematemesis was due to a      recurrent Mallory-Weiss tear.   DISPOSITION:  The patient is medically stable and will be discharged  home.  She is instructed to follow up with her primary care physician,  Dr. Laurance Flatten in 2-3 weeks or sooner if she develops any new issues.   TIME FOR DISCHARGE:  35 minutes.      Jacquelynn Cree, M.D.  Electronically Signed     CR/MEDQ  D:  01/26/2008  T:  01/27/2008  Job:  [id number]   cc:   Chipper Herb, M.D.

## 2010-10-09 NOTE — H&P (Signed)
NAME:  [name], [name]               ACCOUNT NO.:  [id number]   MEDICAL RECORD NO.:  [id number]          PATIENT TYPE:  INP   LOCATION:  5511                         FACILITY:  [facility]   PHYSICIAN:  Sheila Oats, M.D.DATE OF BIRTH:  03-25-1946   DATE OF ADMISSION:  12/28/2004  DATE OF DISCHARGE:                                HISTORY & PHYSICAL   PRIMARY CARE PHYSICIAN:  Geologist, engineering [employer]).   CHIEF COMPLAINT:  Persistent nausea and vomiting.   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  The patient states that she was seen in the ER on the day prior to  admission for an abscess in her buttock area and was given a pain tablet --  and since taking the pain medication, she has been vomiting.  She states  that she has not been able to keep anything down and was unable to count the  number of times she has vomited.  She also states that since coming to the  ER, she has had some hematemesis/coffee-grounds emesis.  The patient denies  chest pain, abdominal pain, fevers, cough, dysuria, diarrhea, melena, and no  hematochezia.  The patient also states that she has been having pain in her  anal area for about 2 weeks and yesterday she came to the ER because of the  increased pain and she was diagnosed with an abscess.  She was then  discharged home on oral antibiotics and pain medication.  She states that  she only took 1 dose of the antibiotic and was unable to keep it down.  She  denies fevers, and states that she has not noted any drainage from the area  of the abscess.   The patient was seen in the ER and her white cell count was elevated, also  her blood pressure was uncontrolled and she is admitted to  the [facility] for further evaluation and management.   PAST MEDICAL HISTORY:  1. As stated above.  2. History of diabetic retinopathy.  3. History of anemia.   MEDICATIONS:  1. Clonidine 0.2 mg p.o. b.i.d.  2. Toprol-XL 50 mg p.o. daily.  3. Actos 30 mg p.o. daily.  4. Enalapril.  5. Zocor 20 mg daily.  6. Keflex -- prescribed on day prior to admission.   ALLERGIES:  VIOXX.   SOCIAL HISTORY:  She denies tobacco; she also denies alcohol.   FAMILY HISTORY:  Her mother and dad have diabetes and also myocardial  infarctions, her dad had colon cancer.   REVIEW OF SYSTEMS:  As per HPI, other review of systems negative.   PHYSICAL EXAMINATION:  GENERAL:  The patient is an obese older white female,  alert and oriented x3, in no acute distress.  VITAL SIGNS:  Her temperature is 98.4, blood pressure is 174/75, was  initially 214/84, her pulse 101, respiratory rate is  20, O2 SAT is 98%.  HEENT:  PERRL, EOMI, sclerae anicteric; dry mucous membranes, no oral  exudates.  NECK:  Supple, no adenopathy, no thyromegaly, no carotid bruits appreciated.  LUNGS:  Clear to auscultation bilaterally.  No crackles rule out wheezes.  CARDIOVASCULAR:  Regular rate and rhythm, normal S1 and S2.  ABDOMEN:  Soft, bowel sounds present, nontender, non-distended, obese, no  organomegaly.  RECTAL/ANAL AREA:  She has a large area of induration along her left gluteal  fold and superiorly in her anal area, there is marked edema with some  pustules, tenderness.  The surrounding area is also erythematous.  EXTREMITIES:  No cyanosis or edema.  NEUROLOGIC:  Alert and oriented x3.  Cranial nerves II-XII grossly intact.  Nonfocal exam.   LABORATORY DATA:  Her white blood cell count is 17.3 with a hemoglobin of  11.4, hematocrit 33.8, the platelet count is 216,000, neutrophil count is  93% and her sodium is 134, potassium of 4.1, chloride is 102,  BUN is 42,  glucose is 389, her pH 7.37, PCO2 33.4,  bicarb 19.5 and her creatinine is  2.4.   ASSESSMENT AND PLAN:  1. Persistent nausea and vomiting/? hematemesis -- per patient's report,      this was precipitated by Vicodin given on the day prior to admission.      I will obtain a urinalysis, serum amylase and lipase as well as cardiac      enzymes, cannot rule out gastroparesis.  I will start Reglan and as-      needed antiemetics.  I will recheck a hemoglobin and hematocrit in the      morning, also Hemoccult emesis, Gastroenterology consult in the morning      as appropriate, noted that the patient has a prior history of Mallory-      Weiss tear.  We will keep the patient nothing-by-mouth for now, hydrate      with intravenous fluids.  2. Perianal abscess -- check blood cultures, empiric antibiotics, surgery      consult in the morning for possible incision and drainage.  3. Uncontrolled hypertension -- clonidine patch while the patient is      nothing-by-mouth, also p.r.n. labetalol, will hold ACE inhibitor for      now as creatinine elevated.  4. Diabetes mellitus, uncontrolled -- Accu-Cheks with sliding-scale      coverage while the patient is nothing-by-mouth.  We will hold Actos      while she is nothing-by-mouth.  5. 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  Her baseline      creatinine is unknown, I will hydrate, recheck and consider renal      ultrasound/further evaluation as appropriate.       ACV/MEDQ  D:  12/29/2004  T:  12/29/2004  Job:  [id number]   cc:   [facility] Dr. Lambert Mody

## 2010-10-09 NOTE — Consult Note (Signed)
[name], [name]               ACCOUNT NO.:  [id number]   MEDICAL RECORD NO.:  [id number]          PATIENT TYPE:  INP   LOCATION:  [id number]                         FACILITY:  [facility]   PHYSICIAN:  Glori Bickers, M.D. LHCDATE OF BIRTH:  1946-01-16   DATE OF CONSULTATION:  07/20/2005  DATE OF DISCHARGE:                                   CONSULTATION   PRIMARY CARE PHYSICIAN:  [facility].   PRIMARY CARDIOLOGIST:  Minus Breeding, M.D.   CHIEF COMPLAINT:  Elevated cardiac enzymes.   HISTORY OF PRESENT ILLNESS:  [name] is a 65 year old white female with  no confirmed history of coronary artery disease, although she has multiple  cardiac risk factors. She recently had an upper respiratory infection with a  sore throat and cough. Yesterday, she began vomiting blood. She went to see  her primary M.D. and was transferred from there to the emergency room. Since  admission, she has not required a transfusion but she has had an EGD, which  showed no gastric or duodenal ulcers and it was felt that the bleeding was  esophageal in origin. She had cardiac enzymes checked and they were  elevated, so cardiology was asked to evaluate her.   [name] has never had chest pain. Except for when the weather is bad,  she exercises regularly by walking outside. She has had no symptoms with  this. She has had no change recently in her exercise tolerance. She has some  dyspnea on exertion when she walks up hills or up steps, but this has not  changed recently either. Since in the hospital, she has not had any  orthopnea, paroxysmal nocturnal dyspnea or chest pain. She is symptom free  at this time.   PAST MEDICAL HISTORY:  1.  Status post Adenosine Myoview in August 2006 (premature).  2.  History of diabetic retinopathy and neuropathy, as well as nephropathy.  3.  Chronic renal insufficiency.  4.  Gastroesophageal reflux disease.  5.  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  6.  History of perirectal abscess.  7.  History of anemia.   SURGICAL HISTORY:  She has had 23 eye surgeries and debridement.   ALLERGIES:  She is intolerant to ASPIRIN and VIOXX, as well as CODEINE and  ALL PAIN MEDS, secondary to GI upset/nausea and vomiting.   MEDICATIONS:  1.  Reglan 5 mg IV q.6h.  2.  Insulin sliding scale.  3.  Zofran 4 mg p.r.n. and Phenergan 25 mg p.r.n.  4.  Toprol XL 50 mg a day.  5.  Enalapril 20 mg b.i.d.   Her husband is disabled. She has never used or abused alcohol, tobacco or  drugs.   FAMILY HISTORY:  Her mother died at age 42 of a heart attack and also had a  history of DVT. Her father died at age __________.   She has had no fevers or chills or sweats. She has had an upper respiratory  infection with a sore throat and some nasal discharge, as well as a cough.  She has had no edema,  palpitations, presyncope or wheezing. She denies  depression or anxiety. She has occasional arthralgias. The nausea and  vomiting are as described above, but she denies diarrhea. Review of systems  is otherwise negative.   PHYSICAL EXAMINATION:  VITAL SIGNS: Her blood pressure is 192/64, with a  heart rate of 104. Respiratory rate 22. Temperature is 99, O2 saturation 96%  on room air.  HEART: S1, S2. Soft systolic murmur is noted at the left upper sternal  border. Distal pulses are 2+ in all 4 extremities and no femoral bruits are  appreciated.  LUNGS: Clear to auscultation bilaterally.  SKIN: No rashes or lesions are noted.  ABDOMEN: Soft and slightly diffuse tenderness with active bowel sounds and  no hepatosplenomegaly is noted.  EXTREMITIES: There is no clubbing, cyanosis, or edema or edema noted.  MUSCULOSKELETAL: There is no joint deformity or effusions and no spine or  CVA tenderness.  NEURO: She is alert and oriented, grossly intact.   Chest x-ray - no acute disease.   EKG - sinus rhythm, rate 93, with no ischemic changes.  Hematocrit 35.6. WBC  9.9, platelets 169. Sodium 135, potassium 3.9, chloride 105, CO2 18, BUN 22,  creatinine 1.3, glucose 196. Total cholesterol 191, triglycerides 121, HDL  40, LDL 127.   IMPRESSION:  Ms. [name] has been seen and examined. She is known to Dr.  Haroldine Laws from a previous consult. She has multiple cardiac risk factors and  was admitted with nausea, vomiting and hematemesis. EGD showed distal  esophagitis. She has had no chest pain or shortness of breath. Her troponin  is mildly elevated, but CK-MBs have been normal. Her EKG has no acute  changes. Of note, she had similar findings in August 2006 and a negative  perfusion study at that time. She has no chest pain or shortness of breath  at baseline. She likely does have underlying coronary artery disease, but  this is not acute coronary syndrome. I suspect she may have had mild chronic  elevated troponin's which are nonspecific. The risks and benefits of a  cardiac catheterization were discussed and she is reluctant to proceed. She  prefers medical management at this time, which is certainly reasonable.  Continue beta blocker. Would change Zocor to Lipitor 40 for better lipid  control, as her goal LDL is less than 70. She is to follow up with Dr.  Percival Spanish as an outpatient.  As an aside, she also likely has obstructive sleep apnea and needs an  outpatient sleep study.   We will sign off at this time, but please call with questions.   This is [name] P.A.C. dictating for Dr. Glori Bickers, who saw  the patient and determined the plan of care.      [name], P.A. [facility]      Glori Bickers, M.D. [facility]  Electronically Signed    RB/MEDQ  D:  07/20/2005  T:  07/21/2005  Job:  [id number]

## 2010-10-09 NOTE — Discharge Summary (Signed)
[place] [employer]  Patient:    DELANNA, JOBST Visit Number: [id number] MRN: [id number]          Service Type: MED Location: [phone] Attending Physician:  Fayrene Fearing Dictated by:   Mamie Laurel, M.D. Admit Date:  05/18/2001 Discharge Date: 05/20/2001   CC:         [facility]   Discharge Summary  DATE OF BIRTH:  Sep 04, 1945  DISCHARGE MEDICATIONS: 1. Protonix 40 mg p.o. q.d. 2. Phenergan 1/2 to 1 pill q.6h. p.r.n. nausea. 3. Altace 5 mg p.o. q.d. 4. Clonidine 0.1 mg two tabs in the morning and one tab at night. 5. Amaryl 2 mg q.d.  DISCHARGE INSTRUCTIONS:  The patient is not to take her aspirin.  She is to avoid acidic and spicy foods for one to two weeks.  She is not to take any aspirin, ibuprofen, or NSAID products.  She is to call [facility] on Monday, May 22, 2001 for an appointment in one to two weeks and to call Dr. Dallas Schimke office in GI if needed.  DISCHARGE DIAGNOSES: 1. Gastrointestinal bleed secondary to Mallory-Weiss tear. 2. Viral gastroenteritis. 3. Anemia. 4. Hypertension. 5. Diabetes.  BRIEF HOSPITAL COURSE:  #1 - GASTROINTESTINAL BLEED:  This is a 65 year old Caucasian female with hypertension and diabetes who presented to the [facility] on May 18, 2001 secondary to 1-1/2 days of nausea, vomiting and inability to take p.o.  The patient was noted at the office to have coffee ground emesis, so was admitted to [facility] with GI bleed.  Hemoglobin initially was 10.7.  Her blood pressure in the office was noted to be 204/102 with CBGs in the 400s.  The patient was admitted with GI bleed likely secondary to persistent nausea and vomiting. The patient had an NG tube placed. Her aspirin was stopped.  She was admitted to Telemetry and started on Protonix twice a day for the bleed and Phenergan for nausea.  She was  hydrated and kept n.p.o. until the nausea and vomiting subsided.  GI service, Dr. Sammuel Cooper was consulted and the patient underwent an EGD where she was found to have Mallory-Weiss tear in the cardia of the stomach that was not bleeding as well as some esophageal inflammation as the result of reflux of moderate severity with some erosions present.  After the EGD, the NG tube was removed. The patient was started on liquid Carafate four times a day and diet was advanced.  The patients hemoglobin was moderate and remained stable.  On May 19, 2001 her hemoglobin was 10.9.  She was discharged home on q.d. Protonix and to avoid acidic foods.  She is to follow up with GI on an as needed basis.  #2 - HYPERTENSION:  This is hypertensive urgency likely secondary to a combination of stress response and rebound from not able to take medicine secondary to nausea and vomiting.  The patient was given IV Labetalol initially until her blood pressure was controlled.  Her ACE inhibitor was held in the setting of acute dehydration.  She was started on a clonidine patch given her nausea and vomiting.  However, once her nausea and vomiting subsided her p.o. medicines for blood pressure were restarted and her blood pressure stabilized.  #3 - DIABETES:  The patient was covered with sliding scale insulin until she was taking p.o.  Once her p.o. intake was increased, she was returned to her  home medicines and her sugars normalized.  The patient was released to the care of [facility]. Dictated by:   Mamie Laurel, M.D. Attending Physician:  Fayrene Fearing DD:  05/20/01 TD:  05/21/01 Job: [id number]

## 2010-10-09 NOTE — Consult Note (Signed)
[name], [name]               ACCOUNT NO.:  [id number]   MEDICAL RECORD NO.:  [id number]          PATIENT TYPE:  INP   LOCATION:  1826                         FACILITY:  [facility]   PHYSICIAN:  Nelwyn Salisbury, M.D.  DATE OF BIRTH:  1946/04/28   DATE OF CONSULTATION:  07/19/2005  DATE OF DISCHARGE:                                   CONSULTATION   REASON FOR CONSULTATION:  1.  Coffee-ground emesis, rule out Mallory-Weiss tear, distal esophagitis,      ulcer disease.  2.  History of reflux on Protonix in the past, presently on Prilosec.  3.  History of chronic constipation on MiraLax in the past.  4.  Diabetes mellitus with a history of diabetic neuropathy.  5.  Renal insufficiency.  6.  History of a perirectal abscess drained by Dr. Zella Richer in the past.  7.  Hypertension.  8.  Hyperlipidemia.   RECOMMENDATIONS:  1.  EGD with possible control of bleeding today.  2.  Continue serial CBCs.  3.  Monitor CBGs and blood pressure closely.  4.  Nothing by mouth for now.   DISCUSSION:  [name]. [name] is a 65 year old white female with the  above-mentioned medical problems who according to her husband had a head  cold about four days ago with some nausea and started vomiting coffee-  ground material this morning after several bouts of nausea and vomiting.  She came to the emergency room and was found to have severe hypertension,  with a hemoglobin of 13.2 and an MCV of 87.3.  A GI consultation was  procured.  The patient has a history of constipation but denies any  abdominal pain, fever, chills, or rigors.  She had similar symptoms last  year when she was thought to have a viral gastroenteritis.  Her appetite has  been fairly good prior to today.  Her weight has been stable.  There is no  history of ulcers, jaundice, or colitis.  Her father has had colon cancer at  the age of 65.   PAST MEDICAL HISTORY:  See list above.   ALLERGIES:  ASPIRIN, CODEINE, AND VIOXX.   MEDICATIONS:  1.  Clonidine.  2.  Hydrochloride.  3.  Toprol-XL.  4.  Actos.  5.  Enalapril.  6.  Zocor.  7.  Nitroglycerin.  8.  Metformin.  9.  Prilosec.  10. Question of Flagyl.   FAMILY HISTORY:  Father had colon cancer.  There is no known family history  of breast, uterine, cervical, or endometrial cancer.   SOCIAL HISTORY:  She lives with her husband in [place], [place].  She denies use of alcohol, tobacco, or drugs.   PHYSICAL EXAMINATION:  GENERAL:  Exam reveals a very pleasant, cooperative  older white female in acute distress secondary to nausea and vomiting.  VITAL SIGNS:  Blood pressure 178/86, pulse 110 per minute, respiratory rate  20.  HEENT:  ATNC .  The patient has edema of her upper lip.  NECK:  Supple.  CHEST:  Clear to auscultation, S1 and S2 regular.  ABDOMEN:  Soft, obese, nontender,  with normal bowel sounds.  No  hepatosplenomegaly.  CNS:  Nonfocal.  RECTAL:  Examination deferred.   LABORATORY DATA:  On admission, white count 7.8, hemoglobin 13.2, hematocrit  39.2, MCV 87.3, platelet count 173.  PT, PTT, and INR were normal.  Lipase  was 18.  Sodium 139, potassium 5, chloride 104, CO2 19, glucose 332, BUN 24,  creatinine 1.5, calcium 9.5.  Total protein 8, albumin 4.1, AST 27, ALT 15.   PLAN:  As above.  Further recommendations to follow.      Nelwyn Salisbury, M.D.  Electronically Signed     JNM/MEDQ  D:  07/19/2005  T:  07/20/2005  Job:  [id number]

## 2010-10-09 NOTE — Consult Note (Signed)
NAMETSERING, LAMPKIN NO.:  [id number]   MEDICAL RECORD NO.:  [id number]          PATIENT TYPE:  INP   LOCATION:  5511                         FACILITY:  [facility]   PHYSICIAN:  Glori Bickers, M.D. LHCDATE OF BIRTH:  1946/01/20   DATE OF CONSULTATION:  12/29/2004  DATE OF DISCHARGE:                                   CONSULTATION   PHYSICIANS:  Consulting physician: Glori Bickers, M.D.  Primary care: [facility].  Requesting physician:  Joesphine Bare, P.A., CCS   REASON FOR CONSULTATION:  EKG changes with elevated troponin.   [name] is a 65 year old Caucasian female, no known history of coronary  artery disease but with multiple risk factors including diabetes,  hypertension, hypercholesterolemia, and family history of CAD, who was  admitted for rectal discomfort and diagnosed with perirectal abscess and is  in need of surgical intervention. We were asked to consult for preop  clearance.   [name] is a patient with chronic renal insufficiency and diabetes x 12  years with complicating retinopathy and nephropathy.  She also has a past  medical history of hypertension, anemia, GI bleed secondary to a Mallory-  Weiss tear in 2003. She has had a total of 23 eye surgeries.   ALLERGIES:  Include VIOXX.   MEDICATIONS HERE:  Include Zofran, Reglan, Phenergan, clonidine patch,  labetalol 10 mg IV p.r.n. Unasyn 3 g IV q.8h, Protonix, and Dilaudid.   [name] has never had any cardiac testing or stress test done.   SOCIAL HISTORY:  She lives in [place] with her husband.  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  She has no children.  She denies any  tobacco use.  She exercises; she states she walks 1 to 2 miles every day.  Denies any alcohol, drug, or herbal medication use.  She tries to follow a  diabetic diet.   FAMILY HISTORY:  Mother deceased at age 80 from a DVT but had known coronary  artery disease and diabetes.  Father  deceased at age 59 from colon cancer,  had known coronary artery disease. She has seven sisters with diabetes and a  sister who had an MI in her 54s.   REVIEW OF SYSTEMS:  Positive for fever, chills, headaches, edema the patient  states secondary to blood pressure medicines.  Palpitations with stress.  MUSCULOSKELETAL:  Positive for joint swelling in her fingers on her right  hand.  GI: Positive for nausea and vomiting, hematemesis and cold  intolerance.   PHYSICAL EXAMINATION:  VITAL SIGNS:  Temperature 98, pulse 109, respirations  20, blood pressure 179/80. She is saturating 97% on room air.  GENERAL:  She is in no acute distress.  She is currently spitting and  gagging with clear sputum into an emesis pan.  HEENT:  Pupils equal, round, and reactive to light.  Sclerae clear.  NECK:  Supple without lymphadenopathy.  Negative bruit or JVD.  CARDIOVASCULAR:  S1, S2, tachycardic rate.  No murmurs, rubs, or gallops.  LUNGS: Clear to auscultation.  ABDOMEN: Soft, nontender.  EXTREMITIES:  No clubbing, cyanosis,  or edema.  DPs are positive  bilaterally.  NEUROLOGIC:  Alert and oriented x 3.  Cranial nerves II-XII grossly intact.   EKG at rate of 103 to 112 sinus tachycardia.  She has ST-T wave depression  in anterolateral leads.   LABORATORY DATA:  White blood cell count 13.7, hemoglobin 12, hematocrit 35,  platelets 212,000.  Sodium 142, potassium 3.4, BUN 39, creatinine 1.9,  glucose 326.  AST 18, ALT 10, amylase 34, lipase 17.  She has a positive  gastric occult.  Albumin 2.9.  Enzymes: Troponin 0.07, CK total 101, CK-MB  3.3, with index of 3.3.   Dr. Pierre Bali in to examine and assess the patient.   A 65 year old obese female with long history of diabetes, hypertension,  hyperlipidemia, but no known coronary artery disease.  However, no history  of stress or catheterization.  Walks 2 miles a day without complaints of  chest pain or shortness of breath.  Admitted several days  ago with  perirectal abscess, nausea and vomiting.  Consult called for perioperative  risk stratification.  The patient denies current chest pain or shortness of  breath.  EKG showing ST depression in V3 through V6 which is new, with mild  elevated troponin.  Although surgery likely to be a low risk at baseline,  given evidence of current ischemia, would postpone surgery if possible and  rule out for myocardial infarction.   PLAN:  1.  Rule out with serial enzymes.  If positive, plan on cardiac      catheterization.  If negative, proceed with adenosine Cardiolite.  2.  Aggressive hydration.  3.  Enteric-coated aspirin 81 mg daily.  4.  Low-dose beta blocker and nitroglycerin patch.  5.  If surgery is imminent, can take to OR with understanding that she is at      increased risk for perioperative myocardial infarction.  6.  Improve blood pressure control.      Rosanne Sack, NP      Glori Bickers, M.D. [facility]  Electronically Signed    MB/MEDQ  D:  12/29/2004  T:  12/29/2004  Job:  [id number]   cc:   [place], [place].A.

## 2010-10-09 NOTE — Op Note (Signed)
NAMESHAKERIA, OPIE               ACCOUNT NO.:  [id number]   MEDICAL RECORD NO.:  [id number]          PATIENT TYPE:  INP   LOCATION:  5511                         FACILITY:  [facility]   PHYSICIAN:  Odis Hollingshead, M.D.DATE OF BIRTH:  [birth year]   DATE OF PROCEDURE:  12/29/2004  DATE OF DISCHARGE:                                 OPERATIVE REPORT   PREOPERATIVE DIAGNOSIS:  Perirectal abscess.   POSTOPERATIVE DIAGNOSIS:  Perirectal abscess.   PROCEDURE:  Simple incision and drainage at bedside.   SURGEON:  Odis Hollingshead, M.D.   INDICATIONS:  [age]-year-old female was admitted with a perirectal  abscess.  However, she also has chemical and EKG changes consistent with an  evolving myocardial infarction.  It is felt that she has a significant  increased risk of perioperative cardiac complications if she is taken to the  operating room for formal debridement.  Because of that, it is done at the  bedside.   TECHNIQUE:  She was placed in the right lateral position.  The abscess in  the left buttock was exposed.  Necrotic tissue was debrided and the abscess  opened, and purulent material drained.  The cavity was debrided gently with  blunt dissection.  Subsequently, a moist gauze was placed.  She tolerated  the procedure fairly well.  This is something we may need to do daily until  she hopefully has been cleared by Cardiology to go to the operating room and  she understands that.       TJR/MEDQ  D:  12/29/2004  T:  12/30/2004  Job:  [id number]

## 2010-10-09 NOTE — Discharge Summary (Signed)
NAME:  [name], [name]               ACCOUNT NO.:  [id number]   MEDICAL RECORD NO.:  [id number]          PATIENT TYPE:  INP   LOCATION:  4704                         FACILITY:  [facility]   PHYSICIAN:  C. Milta Deiters, M.D.DATE OF BIRTH:  November 28, 1945   DATE OF ADMISSION:  07/19/2005  DATE OF DISCHARGE:  07/21/2005                                 DISCHARGE SUMMARY   DISCHARGE DIAGNOSIS:  1.  Esophagitis with coffee ground emesis with no evidence for Mallory-Weiss      tear.  2.  History of Mallory Weiss tear in 2003.  3.  Previous admission for coffee ground emesis in August 2006.  4.  Hypertension.  5.  Diabetes mellitus type 2 with a hemoglobin [id number] of 6.3 complicated by      retinopathy and nephropathy (discharge creatinine equals 1.5).  6.  History of elevated troponin I currently on medical management.  7.  Hyperlipidemia.  8.  History of rectal abscess.  9.  Chronic constipation.  10. History of iron deficiency anemia on iron supplementation.   DISCHARGE MEDICATIONS:  1.  Lipitor 40 mg p.o. daily.  2.  Metformin 500 mg p.o. daily.  3.  Omepramide 1 mg p.o. daily.  4.  Enalapril 20 mg p.o. b.i.d.  5.  Toprol XL 50 mg p.o. daily.  6.  Lorazepam 0.5 mg p.o. b.i.d. p.r.n.  7.  Prilosec OTC 20 mg p.o. b.i.d.  8.  Multivitamin.  9.  Iron supplements.  10. The patient was told to discontinue her Zocor.  11. The patient was told to hold her clonidine until she was seen by her      primary care physician.   CONDITION AT DISCHARGE:  Stable and improved.   The patient was admitted for coffee ground emesis and was found to have  esophagitis but no Mallory-Weiss tear by EGD.  It was noted that she had  elevated cardiac enzymes which she opted to just treat with medical  management.  Her Zocor has been changed to Lipitor in order to get her LDL  less than 70 as she is a diabetic.  It was suggested by the cardiologist,  Dr. Haroldine Laws, who saw her as an inpatient, that she get a sleep  study.  The  patient was instructed to follow up with her nurse practitioner, Chevis Pretty, at [facility] as previously scheduled  later that week of discharge.  The patient was also instructed to follow up  with Dr. Percival Spanish at her already scheduled April appointment.   PROCEDURES:  Chest X-Ray on July 19, 2005, showed no acute  cardiopulmonary disease.   CONSULTANTS:  1.  Nelwyn Salisbury, M.D., GI  2.  Glori Bickers, M.D. [facility], cardiology   HISTORY:  Please see the chart for full details of [name]'s history and  physical exam. In summary, [name] is a 65 year old Caucasian female  with a history of Mallory-Weiss tear diagnosed in 2003 with another bout of  hematemesis in August 2006 who did not seek any GI doctor in follow-up who  presents with a complaint of  coffee ground emesis.  The patient reports  awakening with emesis at about 3 o'clock in the morning on the day of  admission which became coffee colored around 5 o'clock in the morning on the  day of admission.  The patient was in her normal state of health prior to  going to bed.  The patient states she has not been scoped since her  inpatient evaluation at [facility] while an inpatient in 2003.  The  patient states that she had been complaining of upper respiratory infection  symptoms with nausea times 3-4 days prior to admission, but states that she  did not have any coffee ground emesis until the day of admission.   PHYSICAL EXAM:  Vital Signs: Temperature 98.6, blood pressure 218 to 222  over 70 to 111, pulse 113, respiration rate 20, oxygen saturation 98% on  room air.  Pertinent physical exam findings include the patient was actively  vomiting and was in mild distress.  She had pale conjunctivae.  Lungs were  clear.  Heart was tachycardiac but regular. Abdomen was soft, bowel sounds  were present and was nontender.  Skin had a sallow appearance and she had a  flat  affect.   LABS ON ADMISSION:  WBC count was 7.8, hemoglobin 13.2, hematocrit 39.2, MCV  87.3, platelets 173. PT 13.6, INR 1, PTT 28. Sodium 139, potassium 5,  chloride 104, bicarb 19, glucose 332, BUN 24, creatinine 1.5, calcium 9.5,  total protein 8, albumin 4.1. AST 27, ALT 15, alk phos 92, total bili 0.8,  lipase 18.  Hemoglobin A1C 6.3. CK was 33, CK-MB 1.2, troponin-I 0.03. TSH  was 1.321, free T4 1.30. Urine creatinine 39.4.  Urine sodium 131.  UA was  positive for greater than 1000 glucose, moderate hemoglobin, 15 ketones, 100  protein, small leukocyte esterase, 0-2 WBCs, 0-2 RBCs, and rare bacteria.  She had blood cultures drawn which were negative after five days. Urine  culture which showed insignificant growth of 45,000 colonies of Proteus  mirabilis.   HOSPITAL COURSE:  Problem 1:  Esophagitis with coffee-ground emesis.  The patient was admitted  for her complaint of coffee ground emesis in light of the fact that she has  a history of a Mallory-Weiss tear in 2003.  The patient has had one other  admission for coffee ground emesis in August 2006 which was negative for  Mallory-Weiss tear, as well.  This admission, she was seen by the  gastroenterologist, Dr. Collene Mares, who took her to the endoscopy suite and  performed an upper endoscopy within hours of the patient's arrival to the  emergency department.  It was discovered on upper endoscopy that the patient  had signs and symptoms consistent with esophagitis and there was some coffee-  ground emesis noted in her stomach, but there was no signs of a Mallory-  Weiss tear noted on the EGD.  The patient initially was admitted to the step-  down unit with a stable hemoglobin, but was transferred out of the step-down  unit the next morning as her hemoglobin remained stable.  The patient did  not require any transfusions during this hospitalization.  The patient was treated with b.i.d. proton pump inhibitors and was made n.p.o. until  her  nausea and vomiting subsided.  The day after admission, the nausea and  vomiting had subsided, we advanced the patient's diet, and she was able to  eat prior to discharge without vomiting.  On the day of discharge, the  patient's hemoglobin had dropped slightly, likely secondary to dilution from  IV fluids, and we felt that it was stable for her to be discharged as her  nausea and vomiting had completely subsided and the patient was stable and  felt better.  The patient's hemoglobin on discharge was 10.9.  There are no  signs of active bleeding.   Problem 2:  History of elevated troponin I.  The patient is a known diabetic  so she was ruled out for acute coronary syndrome in light of the fact that  she had some nausea and vomiting and she has a history of elevated troponin.  Enzymes were cycled which initially were negative.  However, as they were  cycled, her troponins went from 0.03 to 0.17 at the highest, and then  trended back down to a level of 0.10 on the day of discharge.  Her CK and CK-  MB remained within normal limits.  Dr. Haroldine Laws was consulted who saw the  patient and noted that she had had similar symptoms during her August 2006  hospitalization and had a stress test at that time which was negative.  Regarding this, he spoke with the patient about a possible catheterization  which the patient declined at the present time and consented to medical  management only.  At that time, her Zocor was changed to Lipitor and we  continued her beta blocker, as well.   Problem 3:  Hypertension.  The patient's blood pressure was extremely  elevated on the day of admission and we treated this aggressively with IV  beta blockers initially.  We were able to transfer her medications to the  p.o. form and her blood pressure slowly came back under control.  On the day  of discharge, her blood pressure was [id number], which certainly leaves room for  improvement which can be done as an  outpatient.   Problem 4:  Diabetes mellitus type 2.  The patient's blood sugars were  fairly well controlled during this hospitalization.  Her hemoglobin A1C is  6.3 which is down significantly from the last check in August 2006 where her  hemoglobin A1C was over 10.  It appears that the regimen the patient is on  as an outpatient is working quite well for her, no changes were made to her  medical management of her diabetes.  It is noted that she has a history of  diabetic nephropathy and her discharge creatinine is 1.5 from this  hospitalization.   Problem 5:  Hyperlipidemia. A fasting lipid panel was obtained during this  hospitalization in order to further risk stratify this patient for an acute  coronary syndrome which incidentally was ruled out.  The results of her  fasting lipid panel are a total cholesterol 191, triglycerides 121, HDL 40,  LDL 127.  For this reason, the patient's hyperlipidemic agent was changed from Zocor to Lipitor in order to further lower her LDL cholesterol to less  than 70 as per the recommendations in a diabetic patient.   Problem 5:  The rest of the patient's of chronic medical issues were stable  during this hospitalization and therefore will not be discussed in this  discharge dictation.   DISCHARGE LABS AND VITAL SIGNS:  On the day of discharge, her temperature  was 98.5, pulse 96, respiration rate 18, blood pressure 147/89 and she was  satting at 97% on room air.  Capillary blood glucose was 170.  WBC 9.4,  hemoglobin 10.9, hematocrit 31.6, MCV 87.5, platelets  167.  Sodium 136,  potassium 3.8, chloride 105, bicarb 24, glucose 107, BUN 21, creatinine 1.5,  calcium 8.2. Total CK 102, CK-MB 2.3, relative index 2.3, troponin-I 0.10.      Rico Sheehan, D.O.    ______________________________  C. Milta Deiters, M.D.    EA/MEDQ  D:  09/24/2005  T:  09/24/2005  Job:  [id number]

## 2010-10-09 NOTE — Consult Note (Signed)
NAMEEMILYROSE, [name]               ACCOUNT NO.:  [id number]   MEDICAL RECORD NO.:  [id number]          PATIENT TYPE:  INP   LOCATION:  5511                         FACILITY:  [facility]   PHYSICIAN:  Odis Hollingshead, M.D.DATE OF BIRTH:  1945/07/15   DATE OF CONSULTATION:  12/29/2004  DATE OF DISCHARGE:                                   CONSULTATION   REFERRING PHYSICIAN:  Dr. Lenna Sciara L. Lovena Le.   REASON FOR CONSULTATION:  Perirectal abscess.   HISTORY OF PRESENT ILLNESS:  [name] is a 65 year old female with a 2-  week history of rectal pain after she states that she sat on a stool that  was too small for her.  She continued to have pain and then proceeded to  the emergency room 1-2 days prior to her admission.  She was given  antibiotics as well as pain pills for this abscess.  The pain worsened over  the past 24 hours and then she re-presented to the emergency department,  where she was admitted for IV antibiotic therapy and for surgical  consultation.  Since admission, she has not eaten.  This has been associated  with nausea and vomiting, especially with oral pain pills that she was given  after her emergency room visit.  She states that she has had some coffee-  grounds emesis secondary to this and I have discussed this problem with Dr.  Billey Chang, who states this is secondary to her Mallory-Weiss tear.  Her  hemoglobin is stable.   Lab studies during her hospitalization reveal a BUN of 28, creatinine 1.9,  glucose 328; CMET is otherwise normal.  Her white count is elevated at  13,700.   Her EKG shows anterolateral T wave inversion in 2 EKGs that we have  performed.   A chest x-ray is pending.   SOCIAL HISTORY:  No tobacco, alcohol or illicit drug use.  She is married.  She lives in [place] and has no children.   ALLERGIES:  Allergies per old records indicate VIOXX.   MEDICATIONS:  Currently IV Unasyn, Catapres, insulin, Reglan and Protonix.   FAMILY  HISTORY:  Both parents had coronary artery disease.   PAST MEDICAL HISTORY:  1.  Diabetes mellitus.  2.  Retinopathy.  3.  Neuropathy.  4.  Anemia.  5.  Mallory-Weiss tear.  6.  Hypertension.  7.  Hyperlipidemia.   PHYSICAL EXAMINATION:  VITALS:  Temperature 98, pulse 107, respirations 20,  blood pressure 179/89, O2 saturations 97% on room air.  HEENT:  Grossly normal.  Throat clear.  Conjunctivae normal.  Nares without  discharge.  CHEST:  Clear to auscultation bilaterally, no wheezing or rhonchi.  HEART:  Regular rate and rhythm with a soft 1/6 systolic murmur, no  diastolic component.  ABDOMEN:  Soft, nontender and non-distended.  No masses.  Good bowel sounds.  EXTREMITIES:  Lower extremities:  No peripheral edema.  NEUROLOGIC:  Cranial nerves II-XII grossly intact.  She is alert and  oriented x3.  SKIN:  Warm and dry.  RECTAL:  Exam reveals a perirectal abscess at 5 o'clock in the lithotomy  position with necrotic tissue and this area tracks externally approximately  3 inches.   ASSESSMENT AND PLAN:  1.  Perirectal abscess, currently on intravenous Unasyn.  She needs to have      incision and drainage of the abscess.  2.  Abnormal EKG, troponin minimally elevated at 0.07; [facility]      has been consulted for preoperative evaluation.  3.  Diabetes mellitus with retinopathy and neuropathy.  4.  Hypertension.  5.  Hyperlipidemia.  6.  History of Mallory-Weiss tear.  7.  Anemia.   In the meantime, I will go ahead and check a PT, INR and chest x-ray, and  have her sign a permit for surgery.  In addition, I have discussed our plan  with Dr. Billey Chang and we have consulted [facility] for  evaluation of this patient prior to surgery, which is planned for this  evening.       LB/MEDQ  D:  12/29/2004  T:  12/30/2004  Job:  [id number]

## 2010-10-09 NOTE — Discharge Summary (Signed)
NAME:  [name], [name]               ACCOUNT NO.:  [id number]   MEDICAL RECORD NO.:  [id number]          PATIENT TYPE:  INP   LOCATION:  6732                         FACILITY:  [facility]   PHYSICIAN:  Melissa L. Lovena Le, MD  DATE OF BIRTH:  Oct 02, 1945   DATE OF ADMISSION:  12/28/2004  DATE OF DISCHARGE:  01/04/2005                                 DISCHARGE SUMMARY   DISCHARGING DIAGNOSES:  1.  Perirectal/gluteal abscess.  The patient was admitted with fever, nausea      and vomiting.  Found to have a large gluteal perirectal infection.  A      surgical consult was obtained and the area was debrided.  Because of the      patient's underlying cardiac condition which will be described further,      the patient did not go to the OR for debridement, but was debrided at      bedside.  She tolerated the procedure well and had aggressive local      wound care including wound packing and hydrotherapy with physical      therapy.  At the time of discharge the wound remains open, but clean and      amenable to outpatient therapy with home health wound care as well as      hydrotherapy.  2.  Nausea and vomiting with hematemesis.  The patient developed nausea and      vomiting likely secondary to #1.  She has a history of a Mallory-Weiss      tear.  The hematemesis was self-limited and I suspect that she also had      a repeat Mallory-Weiss tear secondary to the retching.  I will recommend      that when the patient is stabilized and returns home to her primary care      physician that her primary care physician recommend and arrange a GI      consult for possible EGD to evaluate her esophagus.  Chest x-ray showed      no obvious free air in the chest.  Therefore, I believe this is a self-      limiting process.  3.  Cardiovascular.  On admission the patient was found to have slight      depressed T-waves on her EKG with positive cardiac enzymes.  A      cardiology consult was obtained from [facility].  It was      determined that she should be treated aggressively with aspirin, a beta      blocker, and blood pressure medications and that she should undergo a      stress test prior to going to the OR.  Within 24 hours the patient was      taken to the nuclear laboratory.  A stress test was performed which      showed no obvious ischemia.  The patient will remain at home on      transdermal clonidine, transdermal Nitro-Dur, and aspirin, Toprol XL,      and her ACE inhibitor.  I have spoken with the patient's primary  care      physician who will help to arrange follow-up with [facility] to      determine when and if some of these medications may be pared back.  At      the time of discharge I felt they needed to be continued as her blood      pressure still remains to be moderately uncontrolled.  Please note the      patient was also on Zocor at home.  I have resumed that medication now      that she is taking oral medications well.  4.  Infectious disease.  Regarding the abscess named in #1, the patient has      received ceftriaxone intravenously to treat Escherichia coli and Strep      viridans.  Escherichia coli was found her in urine and is sensitive to      ceftriaxone as well as quinolone and the wound itself grew out Strep      viridans sensitive to ceftriaxone and quinolones.  She will therefore to      be discharged to home on Avelox 400 mg daily and Flagyl 500 mg every six      hours for the next 14 days.  5.  Gastrointestinal.  The patient will remain on gastroesophageal reflux      disease therapy with Protonix 40 mg daily and has been requested to keep      her stools soft in order not to impede healing of the perirectal and      gluteal abscess.  I have therefore given her prescriptions for MiraLax      as well as Colace.  6.  Diabetes.  She presented with uncontrolled blood sugars and have      remained moderately uncontrolled because of her infection.  I  initiated      her Actos as soon as she was able to eat after using sliding scale      insulin and I started her on 5 mg of Lantus just prior to her discharge.      The patient, however, is concerned about taking Lantus and refused to      allow me to continue this at home.  I have conveyed to her primary care      physician the patient's concerns and her desire to speak with her      primary care physician before starting Lantus.  The patient relates that      she has a meter and I asked her to please call Dr. Starr Sinclair 's office if      her blood glucoses remain greater than 250.  Her hemoglobin A1C has      returned 10.6 which shows obvious chronic noncompliance or uncontrolled      blood glucoses.  7.  Wound care.  In the hospital the patient has undergone hydrotherapy      daily with physical therapy and wet-to-dry dressings t.i.d.  She will      have home health helping her with her dressing changes and the      recommendations should be obtained and referred to Dr. Jackolyn Confer      at [facility] [phone]).  8.  Pain management.  In the hospital the patient has been using Percocet      one tablet to two tablets every four to six hours p.r.n. as needed.  She      really has not required that much pain medication  and therefore I have      not sent home a prescription with her.  9.  Urinary tract infection.  As stated, the patient's urine grew out      Escherichia coli which was sensitive to Ceftriaxone as well as      quinolones.  10. Nutrition.  Patient has been counseled on using protein powder in order      to aid healing and a low carbohydrate diet in order to maintain her      blood glucoses within normal range.   DISCHARGE MEDICATIONS:  1.  Zocor 20 mg daily.  2.  Avelox 400 mg once daily.  3.  Flagyl 500 mg every six hours.  4.  MiraLax 17 g in 8 ounces of fluid daily.  5.  Clonidine 0.2 mg q.24h. transdermally to be changed weekly. 6.  Aspirin 81 mg  daily.  7.  Nitro-Dur patch 0.4 mg per hour removed at midnight and replaced at 6      a.m.  8.  Multivitamin one tablet daily.  9.  Toprol XL 50 mg daily.  10. Protonix 40 mg once daily.  11. Colace 100 mg once daily.  12. Vasotec 5 mg once daily.  13. Actos 30 mg once daily.   As stated, the patient was using the equivalent of Percocet one to two  tablets every four to six hours in the hospital, but needed them  infrequently and therefore a prescription has not been provided for  narcotics.   The patient is to follow up with Dr. Jackolyn Confer in two weeks.  She was  given the phone number [phone].  I have requested that she follow up with  Dr. Starr Sinclair by the end of the week at [phone].  I have also given her a  prescription reminding her to follow up with the [facility], Dr.  Haroldine Laws with the physician attending to her in the hospital.  They can  review her antihypertensive medications at that time and decide whether she  can discontinue any of those medications.   On the day of discharge the patient's vital signs have remained stable, Tmax  98, blood pressure 150/78, pulse 93, respirations 16, saturations 97%.  Her  CBG this morning was 162.  That was after Lantus 5 the night before and her  Actos would have been started again this morning but the range of her  glucoses yesterday was 235, 184, and 299.   PHYSICAL EXAMINATION:  GENERAL:  Moderately obese white female in no acute  distress.  HEENT:  Pupils are equal, round, and reactive to light.  Extraocular  movements are intact.  Mucous membranes are moist.  NECK:  Supple.  There is no JVD, no lymph nodes, and no carotid bruits.  CHEST:  Clear to auscultation with no rhonchi, rales, or wheezes.  CARDIOVASCULAR:  Regular rate and rhythm.  Positive S1, S2.  No S3, S4.  No  murmurs, rubs, or gallops.  ABDOMEN:  Soft, obese, nontender, nondistended with positive bowel sounds.  EXTREMITIES:  No clubbing, cyanosis,  edema.  As per the surgical note the  wound appeared clean with no unusual drainage.  The depth of the area is not  documented at this time.   PERTINENT LABORATORY VALUES:  Her hemoglobin A1C, as stated, was 10.6.  Her  sodium was 138, potassium 4.3, chloride 105, CO2 25, BUN 14, creatinine 1.2.  Blood cultures have remained negative for five days.  As stated, the routine  culture of the wound grew a few gram-positive cocci, gram-negative rods and  namely E. coli and the gram-positive cocci were viridans Strep.  Her T4 was  166, within normal limits.  Her T3 was 2.6, within normal limits and her TSH  was 0.69.  Occult blood was positive and, as stated, we will request a  follow-up with GI for potential EGD/colonoscopy.  At a minimum, EGD would be  appropriate.  Patient had an amylase and lipase done during the hospital  stay that were within normal limits.  Actually, the amylase was on the low  side.  At this time the patient was deemed stable for discharge to home with  aggressive home health therapy as well as a close follow-up with her primary  care physician whom I have shared the details of this hospital stay.      Melissa L. Lovena Le, MD  Electronically Signed     MLT/MEDQ  D:  01/04/2005  T:  01/04/2005  Job:  [id number]   cc:   Odis Hollingshead, M.D.  [id number], [address]  [place]   [id number]   Lehman Prom, M.D.  [facility]   Glori Bickers, M.D. [place]  [place] [address]

## 2010-10-09 NOTE — H&P (Signed)
[facility]  Patient:    ATALEE, CALIP Visit Number: [id number] MRN: [id number]          Service Type: MED Location: [phone] Attending Physician:  Fayrene Fearing Dictated by:   Verdell Carmine, M.D. Admit Date:  05/18/2001                           History and Physical  DATE OF BIRTH:  1945-10-27  HISTORY OF PRESENT ILLNESS:  The patient is a 65 year old female who presented to her primary MDs office with a 1-1/2 day history of nausea, vomiting, and the inability to take p.o.  This morning, she was noted to have coffee-ground emesis after awakening.  Her father has had a recent bout of viral gastroenteritis, but she has had no other sick contacts, no questionable foods.  Prior to her illness, she was in her routine state of health.  Denied any fevers, chills, abdominal pain, melena, bright red blood per rectum, diarrhea, or constipation.  At her primary MDs office, her blood pressure was noted to be 204/102, and her CBG was 400.  The patient states that she had not been able to take her medications for two days now secondary to her intractable nausea.  PAST MEDICAL HISTORY:  Significant for hypertension, diabetes mellitus type 2, non-insulin requiring x 7 years.  She does have complications of diabetic retinopathy and nephropathy.  MEDICATIONS:  Altace 5 mg p.o. q.d., clonidine 0.2 mg p.o. q.a.m. and 0.1 mg p.o. q.p.m., Amaryl 2 mg p.o. q.d., enteric-coated aspirin 81 mg p.o. q.d.  ALLERGIES:  VIOXX.  PAST SURGICAL HISTORY:  None.  SOCIAL HISTORY:  She lives with her husband in [place].  Have no children.  She denies any tobacco or alcohol use.  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  FAMILY HISTORY:  She had seven sisters, all of who have diabetes mellitus.  A single sister has coronary artery disease and suffered her first MI in her 63s.  Her father has coronary disease  as well as her mother who is decreased secondary to her coronary disease.  REVIEW OF SYSTEMS:  Her weight is stable.  She denies any chest pain, shortness of breath, orthopnea, or dyspnea on exertion.  She denies any urinary frequency, urgency, polyuria, or polydipsia.  States that prior to her illness, her CBGs were in the 80s to 90s fasting.  The remainder of her review of systems was unremarkable.  She denied previous history of gastroparesis or GERD.  PHYSICAL EXAMINATION:  VITAL SIGNS:  Temperature 98.2, blood pressure 230/80, pulse 102, respirations were 20, and she sat 98% on room air.  GENERAL:  She is alert and oriented in mild distress, but diaphoretic.  HEENT:  Retinal hemorrhages are noted on funduscopic exam.  The discs are sharp.  Mucous membranes are dry with a bile-stained tongue.  NECK:  Supple without JVD or lymphadenopathy.  CARDIOVASCULAR:  She has a normal S1 and S2 with regular rate and rhythm.  A grade 1/6 systolic murmur.  LUNGS:  Clear to auscultation bilaterally.  ABDOMEN:  Soft with _________ of gastric tenderness.  No rebound or guarding. Positive bowel sounds.  No hepatosplenomegaly or masses.  The emesis is gastric occult positive.  RECTAL:  Heme negative with soft brown stool.  EXTREMITIES:  She has no cyanosis, clubbing, or edema.  NEUROLOGICAL:  She is grossly intact  with intact sensation of the lower extremities.  SKIN:  Without rash.  LABORATORY DATA:  Currently pending as well as her acute abdominal series and EKG.  ASSESSMENT AND PLAN:  This is a 66 year old female with a history of diabetes and hypertension who presents with a day-and-a-half of severe nausea and vomiting, now with upper gastrointestinal bleed.  1. Gastrointestinal.  Nausea and vomiting is likely secondary to a viral    gastroenteritis.  However, we need to rule out other causes.  Therefore, we    will check an acute abdominal series, LFTs, amylase, and lipase.   Her GI    bleed is likely secondary to a Mallory-Weiss tear.  However, the patient is    at risk for peptic ulcer disease due to her aspirin use.  Therefore, we    will need to get GI involved with her care.  We will start her on IV    Protonix b.i.d. secondary to her bleed.  Will give her Phenergan for    nausea.  If she continues to vomit, we will place an NG tube.  We will keep    her n.p.o. until the nausea and vomiting subside.  Will hydrate her with IV    fluids and monitor serial hemoglobin until her hemoglobin is stable and the    bleeding has resolved. 2. Hypertension.  Her hypertensive urgency is likely secondary to both a    stress reaction from her GI process as well as a rebound hypertension    secondary to the abrupt discontinuation of the clonidine.  We will give her    IV labetalol until her blood pressures are controlled.  We will hold her    ACE inhibitor in the setting of dehydration until her renal function is    normal and her condition has stabilized. 3. Diabetes.  Will cover her with sliding scale insulin until taking p.o.    well, and will follow her CBGs closely. Dictated by:   Verdell Carmine, M.D. Attending Physician:  Fayrene Fearing DD:  05/18/01 TD:  05/18/01 Job: [id number]

## 2010-10-09 NOTE — Op Note (Signed)
[name], [name]               ACCOUNT NO.:  [id number]   MEDICAL RECORD NO.:  [id number]          PATIENT TYPE:  INP   LOCATION:  [id number]                         FACILITY:  [facility]   PHYSICIAN:  Nelwyn Salisbury, M.D.  DATE OF BIRTH:  02-19-1946   DATE OF PROCEDURE:  07/19/2005  DATE OF DISCHARGE:  07/21/2005                                 OPERATIVE REPORT   PROCEDURE PERFORMED:  Emergency diagnostic esophagogastroduodenoscopy.   ENDOSCOPIST:  Nelwyn Salisbury, M.D.   INSTRUMENT USED:  Olympus video panendoscope.   INDICATION FOR PROCEDURE:  41 white female with a history of  coffee-grounds emesis, undergoing EGD, emergent, need to rule out Mallory-  Weiss tear, peptic ulcer disease, esophagitis, etc.   PREPROCEDURE PREPARATION:  Informed consent was procured from the patient.  The patient was fasted for 4 hours prior to the procedure.  She was given  Zofran, Phenergan and IV Protonix prior to the procedure.  Serial CBCs were  checked.   PREPROCEDURE PHYSICAL:  VITAL SIGNS:  The patient had stable vital signs.  NECK:  Supple.  CHEST:  Clear to auscultation.  ABDOMEN:  Soft with normal bowel sounds.   DESCRIPTION OF THE PROCEDURE:  The patient was placed in the left lateral  decubitus position and sedated with 50 mcg of fentanyl and 7.5 mg of Versed  in slow incremental doses.  Once the patient was adequately sedated and  maintained on low-flow oxygen and continuous cardiac monitoring, the Olympus  video panendoscope was advanced through the mouthpiece, over the tongue and  into the esophagus under direct vision.  There was distal esophagitis noted  with some erythema around the GE junction, but no definite Mallory-Weiss  tear was appreciated.  The proximal esophagus appeared normal.  The rest of  the gastric mucosa was normal.  There was some coffee-grounds debris in the  stomach.  The proximal small bowel appeared normal.  There were no ulcers,  erosions, masses,  or polyps seen.  The patient tolerated the procedure well  without complications.  Retroflexion in the high cardia revealed no  abnormalities.   IMPRESSION:  1.  Some coffee grounds seen in the stomach.  2.  Distal esophagitis without clear evidence of Mallory-Weiss noted.  3.  Normal proximal small bowel.   RECOMMENDATIONS:  1.  Continue serial CBCs.  2.  IV Protonix to be continued.  3.  Follow up with Dr. Delfin Edis for a colonoscopy that has been planned      in the future, as the patient did not have a complete exam according to      her clinical records in the past.      Nelwyn Salisbury, M.D.  Electronically Signed     JNM/MEDQ  D:  07/22/2005  T:  07/23/2005  Job:  [id number]   cc:   Delfin Edis, M.D. [facility]  [address] [place]  [place] [address]

## 2010-10-27 ENCOUNTER — Encounter (HOSPITAL_COMMUNITY): Payer: Medicare Other

## 2010-10-27 ENCOUNTER — Encounter (HOSPITAL_COMMUNITY): Payer: Medicare Other | Attending: Nephrology

## 2010-10-27 DIAGNOSIS — N183 Chronic kidney disease, stage 3 unspecified: Secondary | ICD-10-CM | POA: Insufficient documentation

## 2010-10-27 DIAGNOSIS — D638 Anemia in other chronic diseases classified elsewhere: Secondary | ICD-10-CM | POA: Insufficient documentation

## 2010-11-17 ENCOUNTER — Other Ambulatory Visit: Payer: Self-pay | Admitting: Nephrology

## 2010-11-17 ENCOUNTER — Encounter (HOSPITAL_COMMUNITY): Payer: Medicare Other

## 2010-11-18 LAB — POCT HEMOGLOBIN-HEMACUE: Hemoglobin: 11.1 g/dL — ABNORMAL LOW (ref 12.0–15.0)

## 2010-12-08 ENCOUNTER — Other Ambulatory Visit: Payer: Self-pay | Admitting: Nephrology

## 2010-12-08 ENCOUNTER — Encounter (HOSPITAL_COMMUNITY): Payer: Medicare Other | Attending: Nephrology

## 2010-12-08 DIAGNOSIS — N183 Chronic kidney disease, stage 3 unspecified: Secondary | ICD-10-CM | POA: Insufficient documentation

## 2010-12-08 DIAGNOSIS — D638 Anemia in other chronic diseases classified elsewhere: Secondary | ICD-10-CM | POA: Insufficient documentation

## 2010-12-08 LAB — FERRITIN: Ferritin: 758 ng/mL — ABNORMAL HIGH (ref 10–291)

## 2010-12-08 LAB — IRON AND TIBC
Saturation Ratios: 49 % (ref 20–55)
TIBC: 234 ug/dL — ABNORMAL LOW (ref 250–470)
UIBC: 119 ug/dL

## 2010-12-22 ENCOUNTER — Encounter (HOSPITAL_COMMUNITY): Payer: Medicare Other

## 2010-12-22 ENCOUNTER — Other Ambulatory Visit: Payer: Self-pay | Admitting: Nephrology

## 2011-01-05 ENCOUNTER — Encounter (HOSPITAL_COMMUNITY): Payer: Medicare Other | Attending: Nephrology

## 2011-01-05 ENCOUNTER — Other Ambulatory Visit: Payer: Self-pay | Admitting: Nephrology

## 2011-01-05 DIAGNOSIS — N183 Chronic kidney disease, stage 3 unspecified: Secondary | ICD-10-CM | POA: Insufficient documentation

## 2011-01-05 DIAGNOSIS — D638 Anemia in other chronic diseases classified elsewhere: Secondary | ICD-10-CM | POA: Insufficient documentation

## 2011-01-05 LAB — POCT HEMOGLOBIN-HEMACUE: Hemoglobin: 10.9 g/dL — ABNORMAL LOW (ref 12.0–15.0)

## 2011-01-08 ENCOUNTER — Emergency Department (HOSPITAL_COMMUNITY): Payer: Medicare Other

## 2011-01-08 ENCOUNTER — Inpatient Hospital Stay (HOSPITAL_COMMUNITY)
Admission: EM | Admit: 2011-01-08 | Discharge: 2011-01-11 | DRG: 379 | Disposition: A | Discharge status: Home / Self Care | Payer: Medicare Other | Attending: Family Medicine | Admitting: Family Medicine

## 2011-01-08 DIAGNOSIS — K3184 Gastroparesis: Secondary | ICD-10-CM | POA: Diagnosis present

## 2011-01-08 DIAGNOSIS — Z860100 Personal history of colon polyps, unspecified: Secondary | ICD-10-CM

## 2011-01-08 DIAGNOSIS — E785 Hyperlipidemia, unspecified: Secondary | ICD-10-CM | POA: Diagnosis present

## 2011-01-08 DIAGNOSIS — E1169 Type 2 diabetes mellitus with other specified complication: Secondary | ICD-10-CM | POA: Diagnosis present

## 2011-01-08 DIAGNOSIS — K209 Esophagitis, unspecified without bleeding: Secondary | ICD-10-CM | POA: Diagnosis present

## 2011-01-08 DIAGNOSIS — G4730 Sleep apnea, unspecified: Secondary | ICD-10-CM | POA: Diagnosis present

## 2011-01-08 DIAGNOSIS — E039 Hypothyroidism, unspecified: Secondary | ICD-10-CM | POA: Diagnosis present

## 2011-01-08 DIAGNOSIS — N183 Chronic kidney disease, stage 3 unspecified: Secondary | ICD-10-CM | POA: Diagnosis present

## 2011-01-08 DIAGNOSIS — I129 Hypertensive chronic kidney disease with stage 1 through stage 4 chronic kidney disease, or unspecified chronic kidney disease: Secondary | ICD-10-CM | POA: Diagnosis present

## 2011-01-08 DIAGNOSIS — I959 Hypotension, unspecified: Secondary | ICD-10-CM | POA: Diagnosis present

## 2011-01-08 DIAGNOSIS — K920 Hematemesis: Principal | ICD-10-CM | POA: Diagnosis present

## 2011-01-08 DIAGNOSIS — E669 Obesity, unspecified: Secondary | ICD-10-CM | POA: Diagnosis present

## 2011-01-08 DIAGNOSIS — I2510 Atherosclerotic heart disease of native coronary artery without angina pectoris: Secondary | ICD-10-CM | POA: Diagnosis present

## 2011-01-08 DIAGNOSIS — R319 Hematuria, unspecified: Secondary | ICD-10-CM | POA: Diagnosis present

## 2011-01-08 DIAGNOSIS — F341 Dysthymic disorder: Secondary | ICD-10-CM | POA: Diagnosis present

## 2011-01-08 DIAGNOSIS — E1149 Type 2 diabetes mellitus with other diabetic neurological complication: Secondary | ICD-10-CM | POA: Diagnosis present

## 2011-01-08 DIAGNOSIS — Z79899 Other long term (current) drug therapy: Secondary | ICD-10-CM

## 2011-01-08 LAB — DIFFERENTIAL
Eosinophils Absolute: 0 10*3/uL (ref 0.0–0.7)
Eosinophils Relative: 0 % (ref 0–5)
Lymphocytes Relative: 6 % — ABNORMAL LOW (ref 12–46)
Lymphs Abs: 1.1 10*3/uL (ref 0.7–4.0)
Monocytes Absolute: 0.8 10*3/uL (ref 0.1–1.0)
Monocytes Relative: 4 % (ref 3–12)

## 2011-01-08 LAB — CBC
HCT: 37 % (ref 36.0–46.0)
MCH: 29.6 pg (ref 26.0–34.0)
MCHC: 33.5 g/dL (ref 30.0–36.0)
MCV: 88.3 fL (ref 78.0–100.0)
Platelets: 212 10*3/uL (ref 150–400)
RDW: 15.7 % — ABNORMAL HIGH (ref 11.5–15.5)
WBC: 18.2 10*3/uL — ABNORMAL HIGH (ref 4.0–10.5)

## 2011-01-08 LAB — BASIC METABOLIC PANEL
BUN: 31 mg/dL — ABNORMAL HIGH (ref 6–23)
Calcium: 9.5 mg/dL (ref 8.4–10.5)
Chloride: 102 mEq/L (ref 96–112)
Creatinine, Ser: 1.7 mg/dL — ABNORMAL HIGH (ref 0.50–1.10)
GFR calc Af Amer: 37 mL/min — ABNORMAL LOW (ref >60)
GFR calc non Af Amer: 30 mL/min — ABNORMAL LOW (ref >60)

## 2011-01-08 LAB — COMPREHENSIVE METABOLIC PANEL
BUN: 31 mg/dL — ABNORMAL HIGH (ref 6–23)
Calcium: 9.4 mg/dL (ref 8.4–10.5)
GFR calc Af Amer: 37 mL/min — ABNORMAL LOW (ref >60)
Glucose, Bld: 428 mg/dL — ABNORMAL HIGH (ref 70–99)
Total Protein: 7.3 g/dL (ref 6.0–8.3)

## 2011-01-08 LAB — GLUCOSE, CAPILLARY: Glucose-Capillary: 365 mg/dL — ABNORMAL HIGH (ref 70–99)

## 2011-01-08 LAB — LIPASE, BLOOD: Lipase: 18 U/L (ref 11–59)

## 2011-01-08 LAB — PROTIME-INR: Prothrombin Time: 13.7 seconds (ref 11.6–15.2)

## 2011-01-09 DIAGNOSIS — K270 Acute peptic ulcer, site unspecified, with hemorrhage: Secondary | ICD-10-CM

## 2011-01-09 DIAGNOSIS — R112 Nausea with vomiting, unspecified: Secondary | ICD-10-CM

## 2011-01-09 LAB — BASIC METABOLIC PANEL
Calcium: 9.1 mg/dL (ref 8.4–10.5)
Chloride: 103 mEq/L (ref 96–112)
Creatinine, Ser: 1.42 mg/dL — ABNORMAL HIGH (ref 0.50–1.10)
GFR calc Af Amer: 45 mL/min — ABNORMAL LOW (ref >60)
GFR calc non Af Amer: 37 mL/min — ABNORMAL LOW (ref >60)

## 2011-01-09 LAB — GLUCOSE, CAPILLARY
Glucose-Capillary: 469 mg/dL — ABNORMAL HIGH (ref 70–99)
Glucose-Capillary: 416 mg/dL — ABNORMAL HIGH (ref 70–99)
Glucose-Capillary: 402 mg/dL — ABNORMAL HIGH (ref 70–99)
Glucose-Capillary: 205 mg/dL — ABNORMAL HIGH (ref 70–99)

## 2011-01-09 LAB — CBC
MCV: 87.3 fL (ref 78.0–100.0)
Platelets: 183 10*3/uL (ref 150–400)
RDW: 15.7 % — ABNORMAL HIGH (ref 11.5–15.5)
WBC: 14.4 10*3/uL — ABNORMAL HIGH (ref 4.0–10.5)

## 2011-01-09 LAB — URINALYSIS, ROUTINE W REFLEX MICROSCOPIC
Protein, ur: NEGATIVE mg/dL
Urobilinogen, UA: 0.2 mg/dL (ref 0.0–1.0)

## 2011-01-09 LAB — HEMOGLOBIN AND HEMATOCRIT, BLOOD
HCT: 36.2 % (ref 36.0–46.0)
Hemoglobin: 12.6 g/dL (ref 12.0–15.0)
Hemoglobin: 12.5 g/dL (ref 12.0–15.0)

## 2011-01-09 LAB — MRSA PCR SCREENING: MRSA by PCR: NEGATIVE

## 2011-01-09 LAB — URINE MICROSCOPIC-ADD ON

## 2011-01-10 LAB — CBC
HCT: 35.8 % — ABNORMAL LOW (ref 36.0–46.0)
Hemoglobin: 12 g/dL (ref 12.0–15.0)
MCHC: 33.5 g/dL (ref 30.0–36.0)
MCV: 87.1 fL (ref 78.0–100.0)
RDW: 15.7 % — ABNORMAL HIGH (ref 11.5–15.5)

## 2011-01-10 LAB — GLUCOSE, CAPILLARY
Glucose-Capillary: 158 mg/dL — ABNORMAL HIGH (ref 70–99)
Glucose-Capillary: 167 mg/dL — ABNORMAL HIGH (ref 70–99)
Glucose-Capillary: 186 mg/dL — ABNORMAL HIGH (ref 70–99)
Glucose-Capillary: 88 mg/dL (ref 70–99)

## 2011-01-11 LAB — BASIC METABOLIC PANEL
CO2: 24 mEq/L (ref 19–32)
Calcium: 7.6 mg/dL — ABNORMAL LOW (ref 8.4–10.5)
Creatinine, Ser: 1.62 mg/dL — ABNORMAL HIGH (ref 0.50–1.10)
GFR calc Af Amer: 39 mL/min — ABNORMAL LOW (ref >60)
GFR calc non Af Amer: 32 mL/min — ABNORMAL LOW (ref >60)
Sodium: 137 mEq/L (ref 135–145)

## 2011-01-11 LAB — URINALYSIS, ROUTINE W REFLEX MICROSCOPIC
Bilirubin Urine: NEGATIVE
Glucose, UA: NEGATIVE mg/dL
Ketones, ur: NEGATIVE mg/dL
Protein, ur: NEGATIVE mg/dL
Urobilinogen, UA: 0.2 mg/dL (ref 0.0–1.0)

## 2011-01-11 LAB — GLUCOSE, CAPILLARY
Glucose-Capillary: 56 mg/dL — ABNORMAL LOW (ref 70–99)
Glucose-Capillary: 105 mg/dL — ABNORMAL HIGH (ref 70–99)
Glucose-Capillary: 56 mg/dL — ABNORMAL LOW (ref 70–99)
Glucose-Capillary: 100 mg/dL — ABNORMAL HIGH (ref 70–99)

## 2011-01-11 LAB — URINE MICROSCOPIC-ADD ON

## 2011-01-11 LAB — CBC
MCH: 29.4 pg (ref 26.0–34.0)
MCHC: 33 g/dL (ref 30.0–36.0)
MCV: 89.1 fL (ref 78.0–100.0)
Platelets: 199 10*3/uL (ref 150–400)
RBC: 3.77 MIL/uL — ABNORMAL LOW (ref 3.87–5.11)
RDW: 16.1 % — ABNORMAL HIGH (ref 11.5–15.5)

## 2011-01-11 NOTE — H&P (Signed)
NAMEREYNE, SUMPTION NO.:  [id number]  MEDICAL RECORD NO.:  [id number]  LOCATION:  [id number]                         FACILITY:  [facility]  PHYSICIAN:  Dickie La, MD        DATE OF BIRTH:  [birth year]  DATE OF ADMISSION:  01/08/2011 DATE OF DISCHARGE:                             HISTORY & PHYSICAL   PRIMARY CARE PROVIDER:  Chipper Herb, MD, at [facility].  CHIEF COMPLAINT:  Vomiting blood.  HISTORY OF PRESENT ILLNESS:  Ms. Wohlwend is a 65 year old female presenting with coffee ground emesis at 6 p.m. today associated with nausea.  She vomited twice before heading to the ED and continued having vomiting in the emergency department.  She reports 2 episodes of soft stools today as well.  No bright red blood per rectum.  Denies any abdominal pain.  Denies any use of NSAIDs, steroids, or alcohol.  Has had no change in medicine.  Has taken medicines as scheduled.  She denies any fevers, any chills, any sick contacts or any change in diet. She denies any dizziness or lightheadedness.  She denies dysuria or polyuria.  She has had a similar episode of this vomiting and upper GI bleed in 2009, was seen by Dr. Delfin Edis at [facility].  She had an emptying study that showed marked gastroparesis.  She has not had a similar problem since, has not recently seen GI.  In the ER, she was given Protonix drip, Zofran, and put on a glucose stabilizer.  PAST MEDICAL HISTORY:   type 2 diabetes  hyperlipidemia, hypertension chronic kidney disease stage III.Her nephrologist is Dr. Mercy Moore.   Colonic benign polyp shown on 2008 colonoscopy,  diabetic gastroparesis diagnosed by gastric emptying in 2009 esophagitis diverticulosis anxiety depression diabetic retinopathy CAD   history of upper GI bleed.  PAST SURGICAL HISTORY:  Includes a colonoscopy in 2008, an EGD in 2009 that showed benign polyps.  SOCIAL HISTORY:  She denies tobacco, alcohol use, or  any illicit drug use.  She lives at home with her husband.  FAMILY HISTORY:  Significant for CAD, diabetes, and colon cancer in her father.  ALLERGIES:  ASPIRIN, CODEINE, VIOXX.  CURRENT MEDICATIONS AT HOME: 1. Calcitriol 0.15mcg po qd. 2. Clonidine 0.2mg  po qd 3. Glimeperide 4mg  po qd 4. Lasix 40mg  po qd 5. Levothyroxine 77mcg po qd 6. Lorazepam 0.5mg  po qd 7. Metoclopramide 5mg  po qd 8. Metoprolol XL 50mg  po qd 9. Vytorin 10/40mg  po qd 10. Losartan 100mg  po qd  REVIEW OF SYSTEMS:  Negative except per HPI.  PHYSICAL EXAMINATION:  VITAL SIGNS:  Heart rate 98, blood pressure 183/94, respiratory rate 20, oxygen 98% on room air, temperature 98.1. GENERAL:  She is alert, cooperative and having episodes of spitting coffee-colored sputum while talking with her. HEENT:  Pupils equal, round, reactive to light and accommodation. Extraocular movement intact. HEART:  S1, S2 normal.  No murmurs, rubs or gallops.  Regular rhythm and slightly tachycardic. LUNGS:  Clear to auscultation.  No wheezes or rales.  Unlabored breathing. ABDOMEN:  Soft with no significant tenderness, masses, organomegaly or guarding. EXTREMITIES:  Normal, atraumatic.  No cyanosis, no edema. SKIN:  No rashes, dry and intact. NEUROLOGY:  Normal without focal findings.  Mental status:  Speech normal, alert and oriented x3.  LABS AND IMAGING:  Sodium 140, potassium 4.2, chloride 102, bicarb 20, BUN 31, creatinine 1.7, glucose 428.  WBC 18.2, hemoglobin 12.4, hematocrit 37.0, MCV 88.3, platelets 212 with 89% neutrophils.  ASSESSMENT/PLAN:  Goldy Skibinski is a 65 year old female presenting with upper GI bleed. 1. Upper GI bleed similar to prior episode in 2009.  We     will continue overnight Protonix at 8 mg per hour IV.  Her     hemoglobin was within normal limits and she did not have any     symptoms of anemia.  We will check hemoglobin every 4 hours.  We     will consult GI in the morning.  The patient is  n.p.o. for possible     EGD. 2. Nausea and vomiting.  Continue Zofran 8 mg every 6 hours. 3. Increased white blood cell count.  Unclear right now of the     etiology, no active source of infection could be identified for     now.  The patient is afebrile.  We will check urinalysis and urine     culture. Will repeat CBC in AM.  4. Diabetes.  The patient was started on insulin sliding scale. Recheck BMP in morning for anion gap. Currently patient is not acidotic.  5. High blood pressure.  The patient started on Lopressor IV at 5 mg     every 2 hours p.r.n. if BP greater than 180.  6. Chronic kidney disease stage III, creatinine is currently stable.     Creatinine in 2010 baseline was 1.84. 7. FEN/GI.  Currently n.p.o. Started on D5 1/2 NS at 175cc/hr for first 4hours, followed by 100cc/hr 8. Prophylaxis.  SCDs. 9. Disposition.  Admit to Step-Down Unit.  Restart home meds in the     morning.    ______________________________ Liam Graham, MD   ______________________________ Dickie La, MD    SL/MEDQ  D:  01/09/2011  T:  01/09/2011  Job:  [id number]  Electronically Signed by Liam Graham MD on 01/09/2011 04:43:28 AM Electronically Signed by Dorcas Mcmurray MD on 01/11/2011 10:38:16 AM

## 2011-01-19 NOTE — Discharge Summary (Signed)
NAMELEONTINE, GLADEN NO.:  [id number]  MEDICAL RECORD NO.:  [id number]  LOCATION:  [id number]                         FACILITY:  [facility]  PHYSICIAN:  Dickie La, MD        DATE OF BIRTH:  [birth year]  DATE OF ADMISSION:  01/08/2011 DATE OF DISCHARGE:  01/11/2011                              DISCHARGE SUMMARY   PRIMARY CARE PROVIDER:  Chipper Herb, MD at [facility].  DISCHARGE DIAGNOSES: 1. Upper gastrointestinal bleed. 2. Type 2 diabetes. 3. Hypertension. 4. Chronic kidney disease stage III. 5. Diabetic gastroparesis. 6. Esophagitis. 7. Coronary artery disease. 8. Hyperlipidemia. 9. Hypothyroidism. 10.Depression, anxiety.  Discharge medications: Protonix 40mg  po bid calcitriol 0.59mcg po qd furosemide 40mg  po daily glimepiride 4mg  po qd levothyroxine 12mcg po daily lorazepam 0.5mg  po 1-2 tabs as needed for anxiety losartan 100mg  po daily metoclopramide 5mg  1 tab po daily metoprolol XL 50mg  1 tab po daily vytorin 10/40 1 tab po daily   CONSULTS:  [name] Plenty, MD with Gastroenterology.  PROCEDURES:  EGD performed on January 10, 2011.  Result from EGD showed distal esophagitis with focal esophageal erosion, unclear whether likely to have caused significant bleeding.  PERTINENT LABS AND LABS AT DISCHARGE:  CBC on admission hemoglobin 12.4, hematocrit 37.0, white blood cell count 18.2 with 89% neutrophils, platelets: 212. Glucose on admit 428.  Complete metabolic panel on admission sodium 140, potassium 4.2, chloride 102, bicarb 20, BUN 31, creatinine 1.7, glucose 428.  Lipase was normal at 18, AST 16, ALT 9, alk phos 115, T bili 0.5, HbA1c 7.7.  Urinalysis on admission showed specific gravity of 1.014, greater than 1000 glucose, 15 ketones, large blood, negative protein, moderate leukocytes, and negative nitrites.  Incidental pro-BNP was 34,325.  DISCHARGE LABORATORY DATA:  Sodium 137, potassium 3.2, chloride 103, bicarb  24, BUN 15, creatinine 1.62, glucose 89.  CBC on discharge, hemoglobin 11.1, hematocrit 33.6, white blood cell count 12.3, platelets 199.  Urinalysis on discharge showed negative glucose, negative ketones, negative proteins, negative nitrites, negative leukocytes, and moderate blood with a specific gravity of 1.012.  BRIEF HOSPITAL COURSE:  A 65 year old female with history of diabetes, hypertension, chronic kidney stage III, diabetic gastroparesis, hyperlipidemia, esophagitis who presented with an upper GI bleed and coffee-ground emesis. 1. Upper GI bleed.  The patient presented to the ER with a 1-day     history of vomiting of coffee-ground emesis. Coffee ground emesis was witnessed in ED as well.  The patient had a history of similar episode in 2009 and had been worked up for     gastroparesis at that time.  In the ED, the patient was put on IV     Protonix drip and Zofran.  Her H and H remained stable     throughout the hospitalization.  The patient was made n.p.o. for GI     to consult.  GI decided to perform an EGD, which showed distal     esophagitis with focal esophageal erosion, which was unclear     whether or likely to have caused significant bleeding.  Per     Gastroenterology recommendation, the patient was started on  Protonix 40 mg twice a day with metoclopramide for 2 months after     admission and then probably stay on a daily PPI thereafter. 2. Hypertension.  Following her EGD, the patient was put back on her     home meds for hypertension, which included Lasix 40, losartan 100     mg, and metoprolol 50.  The patient had episode of hypotension     following the EGD going down to 74 systolic over 42 diastolic.  The     patient was given 1 L normal saline bolus, was put in Trendelenburg     position and recovered shortly thereafter with improvement of blood     pressures.  The patient had associated altered mental status during     this episode, and it was thought that  she had a reaction to the     anesthesia, fentanyl, and Versed given for the EGD.  Her home     blood pressure were held overnight and her morning blood pressure     was 134/66.  The patient's mental status also improved back to     baseline. 3. Diabetes type 2.  The patient had elevated glucose on admit with     glucosuria, but no acidosis.  Her home glimepiride was held, and     the patient was started on sliding scale during the     hospitalization.  Her discharge glucose was 89, and UA on discharge     was negative for glucose and ketones.  The patient was discharged     on her home dose of glimepiride. 4. Chronic kidney disease stage III.  The patient's creatinine on     admission was 1.7, which was similar to a creatinine in 2010 at     1.84 and therefore thought to be her baseline.  The patient was     discharged on her home calcitriol. 5. Hematuria moderate, was found on UA at both admission and     discharge. Patient to be worked up for this as outpatient. 6. Coronary artery disease.  The patient did not report any history of     heart disease despite records showing diagnosis of coronary artery     disease. pro-Btype natriuretic peptide was incidentally found to be elevated.  The patient was     notified and advised to schedule an appointment with cardiology to get outpatient cardiac echo.  The     patient was discharged on her home dose of furosemide 40 mg,     losartan 100 mg, and metoprolol 5 mg. 7. Sleep apnea.  The patient had episodes of O2 desaturation overnight     down to 70% with quick immediate recovery back to the high 90s.     Given the patient's habitus, the patient was advised to get a sleep     study as an outpatient. 8. Gastroparesis.  The patient was discharged on Reglan. 9. Depression, anxiety.  Continue lorazepam. 10.Hypothyroid.  Continue levothyroxine. 11.Hyperlipidemia.  Continue Vytorin.  DIET ON DISCHARGE:  Diabetic diet.  Return to hospital if any  evidence of gastrointestinal bleeding, any shortness of breath, dizziness, lightheadedness.  FOLLOWUP APPOINTMENTS:  The patient is to follow up with Dr. Laurance [name], her primary care doctor.  DISCHARGE CONDITION:  The patient was discharged home in stable medical condition.    ______________________________ Liam Graham, MD   ______________________________ Dickie La, MD    SL/MEDQ  D:  01/12/2011  T:  01/13/2011  Job:  [id number]  cc:   Dr. Laurance [name]  Electronically Signed by Liam Graham MD on 01/13/2011 04:35:00 PM Electronically Signed by Dorcas Mcmurray MD on 01/19/2011 11:24:58 AM

## 2011-01-26 ENCOUNTER — Other Ambulatory Visit: Payer: Self-pay | Admitting: Nephrology

## 2011-01-26 ENCOUNTER — Encounter (HOSPITAL_COMMUNITY)
Admission: RE | Admit: 2011-01-26 | Discharge: 2011-01-26 | Disposition: A | Discharge status: Home / Self Care | Payer: Medicare Other | Source: Ambulatory Visit | Attending: Nephrology | Admitting: Nephrology

## 2011-01-26 DIAGNOSIS — D638 Anemia in other chronic diseases classified elsewhere: Secondary | ICD-10-CM | POA: Insufficient documentation

## 2011-01-26 DIAGNOSIS — N183 Chronic kidney disease, stage 3 unspecified: Secondary | ICD-10-CM | POA: Insufficient documentation

## 2011-02-10 ENCOUNTER — Encounter: Payer: Self-pay | Admitting: Cardiology

## 2011-02-10 ENCOUNTER — Ambulatory Visit (INDEPENDENT_AMBULATORY_CARE_PROVIDER_SITE_OTHER): Payer: Medicare Other | Admitting: Cardiology

## 2011-02-10 DIAGNOSIS — E782 Mixed hyperlipidemia: Secondary | ICD-10-CM | POA: Insufficient documentation

## 2011-02-10 DIAGNOSIS — E785 Hyperlipidemia, unspecified: Secondary | ICD-10-CM

## 2011-02-10 DIAGNOSIS — R6889 Other general symptoms and signs: Secondary | ICD-10-CM

## 2011-02-10 DIAGNOSIS — E669 Obesity, unspecified: Secondary | ICD-10-CM

## 2011-02-10 DIAGNOSIS — I10 Essential (primary) hypertension: Secondary | ICD-10-CM | POA: Insufficient documentation

## 2011-02-10 DIAGNOSIS — R899 Unspecified abnormal finding in specimens from other organs, systems and tissues: Secondary | ICD-10-CM | POA: Insufficient documentation

## 2011-02-10 LAB — IRON AND TIBC
Iron: 79
TIBC: 330

## 2011-02-10 LAB — CBC
MCHC: 33.9
MCV: 84.7
RBC: 4
RDW: 15.3

## 2011-02-10 LAB — FERRITIN: Ferritin: 109 (ref 10–291)

## 2011-02-10 NOTE — Assessment & Plan Note (Signed)
The patient's abnormal laboratory test is not explainable I do not believe by her renal insufficiency. I will repeat a BNP (this will not be a pro BNP). I will check a TSH. I will order an echocardiogram. I would not be surprised to find some left ventricular dysfunction.

## 2011-02-10 NOTE — Patient Instructions (Addendum)
Please have lab work today at [facility]  Please continue all medications as listed.  Your physician has requested that you have an echocardiogram. Echocardiography is a painless test that uses sound waves to create images of your heart. It provides your doctor with information about the size and shape of your heart and how well your heart's chambers and valves are working. This procedure takes approximately one hour. There are no restrictions for this procedure.

## 2011-02-10 NOTE — Assessment & Plan Note (Signed)
The blood pressure is at target. No change in medications is indicated. We will continue with therapeutic lifestyle changes (TLC).  

## 2011-02-10 NOTE — Progress Notes (Signed)
HPI The patient is referred for evaluation of an abnormal  Pro BNP.  Her level was 34,325.  She was in the hospital because of GI bleeding. She does not have any cardiac complaints. She has been having some lower extremity edema. However, I reviewed all of her hospital records and her chest x-ray was unremarkable. The patient denies any new symptoms such as chest discomfort, neck or arm discomfort. There has been no new shortness of breath, PND or orthopnea. There have been no reported palpitations, presyncope or syncope.  She is not very active.  Her husband reports that her "body heat" has increased recently.    Of note, the patient has had a negative stress test in the past but no history of CAD or CHF.  Allergies  Allergen Reactions  . Aspirin   . Ciprofloxacin   . Codeine   . Micardis (Telmisartan)   . Rofecoxib     Current Outpatient Prescriptions  Medication Sig Dispense Refill  . calcitRIOL (ROCALTROL) 0.25 MCG capsule Take 0.25 mcg by mouth daily.        . cloNIDine (CATAPRES) 0.2 MG tablet Take 0.2 mg by mouth daily.        . furosemide (LASIX) 40 MG tablet Take 40 mg by mouth daily.        Marland Kitchen glimepiride (AMARYL) 4 MG tablet Take 4 mg by mouth. 1/2 qAM AND 1 QHS       . levothyroxine (SYNTHROID, LEVOTHROID) 25 MCG tablet Take 25 mcg by mouth daily.        Marland Kitchen losartan (COZAAR) 100 MG tablet Take 100 mg by mouth daily.        . metoCLOPramide (REGLAN) 5 MG tablet Take 5 mg by mouth 4 (four) times daily.        . metoprolol (TOPROL-XL) 50 MG 24 hr tablet Take 50 mg by mouth daily.        . pantoprazole (PROTONIX) 40 MG tablet Take 40 mg by mouth daily.          Past Medical History  Diagnosis Date  . Hypertension   . Hyperlipidemia   . Thyroid disease     hypothryoidism  . GERD (gastroesophageal reflux disease)   . GAD (generalized anxiety disorder)   . GIB (gastrointestinal bleeding)   . Diabetes mellitus   . CKD (chronic kidney disease), stage III     No past surgical  history on file.  No family history on file.  History   Social History  . Marital Status: Married    Spouse Name: N/A    Number of Children: N/A  . Years of Education: N/A   Occupational History  . Not on file.   Social History Main Topics  . Smoking status: Never Smoker   . Smokeless tobacco: Not on file  . Alcohol Use: Not on file  . Drug Use: Not on file  . Sexually Active: Not on file   Other Topics Concern  . Not on file   Social History Narrative  . No narrative on file    ROS:  Joint pain. Otherwise as stated in the HPI and negative for all other systems.  GENERAL:  Well appearing 152/92    91 HEENT:  Pupils equal round and reactive, fundi not visualized, oral mucosa unremarkable NECK: Jugular venous distention 8 cm at 45 degrees, waveform within normal limits, carotid upstroke brisk and symmetric, no bruits, no thyromegaly LYMPHATICS:  No cervical, inguinal adenopathy LUNGS:  Clear to auscultation  bilaterally BACK:  No CVA tenderness CHEST:  Unremarkable HEART:  PMI not displaced or sustained,S1 and S2 within normal limits, no S3, no S4, no clicks, no rubs, soft apical systolic murmur ABD:  Flat, positive bowel sounds normal in frequency in pitch, no bruits, no rebound, no guarding, no midline pulsatile mass, no hepatomegaly, no splenomegaly, obese EXT:  2 plus pulses throughout, moderate lower extremity edema, no cyanosis no clubbing SKIN:  No rashes no nodules NEURO:  Cranial nerves II through XII grossly intact, motor grossly intact throughout PSYCH:  Cognitively intact, oriented to person place and time  EKG:  Sinus rhythm, right bundle branch block, left axis deviation, left anterior vesicular block, possible old anteroseptal MI.  ASSESSMENT AND PLAN

## 2011-02-10 NOTE — Assessment & Plan Note (Signed)
The patient understands the need to lose weight with diet and exercise. We have discussed specific strategies for this.  

## 2011-02-11 LAB — CBC
HCT: 34.2 — ABNORMAL LOW
Hemoglobin: 11.4 — ABNORMAL LOW
MCV: 86.3
Platelets: 200
WBC: 6

## 2011-02-12 LAB — CBC
Platelets: 185
RDW: 15.5
WBC: 8.6

## 2011-02-14 NOTE — Op Note (Signed)
  NAMEMERCEDIES, MATTOON               ACCOUNT NO.:  [id number]  MEDICAL RECORD NO.:  [id number]  LOCATION:  [id number]                         FACILITY:  [facility]  PHYSICIAN:  Tacuma Graffam C. Amedeo Plenty, M.D.    DATE OF BIRTH:  16-Dec-1945  DATE OF PROCEDURE:  01/09/2011 DATE OF DISCHARGE:                              OPERATIVE REPORT   REASON FOR CONSULTATION:  Reported coffee-ground emesis in a patient with diabetic gastroparesis.  PROCEDURE:  The patient was placed in the left lateral decubitus position and placed on the pulse monitor with continuous low-flow oxygen delivered by nasal cannula.  She was sedated with 50 mcg IV fentanyl and 5 mg IV Versed.  Olympus video endoscope was advanced under direct vision into the oropharynx and esophagus.  The esophagus was straight and of normal caliber with squamocolumnar line at 38 cm.  There was an erosion with exudate with slight rim of erythema at the GE junction with one or two other smaller erosions.  No definite ulcer and no visible vessel.  No active bleeding.  Otherwise, the esophagus was normal.  The stomach was entered and a small amount of liquid secretions were suctioned from the fundus.  There was no retained food.  Retroflexed view of cardia was unremarkable.  The fundus, body, antrum, and pylorus all appeared normal.  The duodenum was entered and both bulb and second portion were inspected and appeared to be within normal limits.  The scope was then withdrawn and the patient returned to the recovery room in stable condition.  She tolerated the procedure well, and there were no immediate complications.  IMPRESSION:  Distal esophagitis with focal esophageal erosion, unclear whether likely to have caused significant bleeding.  PLAN:  Treat with double dose proton pump inhibitor in addition to her baseline metoclopramide for 2 months and then probably stay on a daily PPI thereafter.  We will advance her diet as tolerated.     ______________________________ Elyse Jarvis. Amedeo Plenty, M.D.     JCH/MEDQ  D:  01/10/2011  T:  01/10/2011  Job:  [id number]  Electronically Signed by Teena Irani M.D. on 02/14/2011 11:39:44 AM

## 2011-02-14 NOTE — Consult Note (Signed)
  NAMEDORTHY, GRIGSON               ACCOUNT NO.:  [id number]  MEDICAL RECORD NO.:  [id number]  LOCATION:  [id number]                         FACILITY:  [facility]  PHYSICIAN:  Tatayana Beshears C. Amedeo Plenty, M.D.    DATE OF BIRTH:  December 11, 1945  DATE OF CONSULTATION:  01/09/2011 DATE OF DISCHARGE:                                CONSULTATION   INDICATIONS FOR PROCEDURE:  Coffee-ground emesis.  HISTORY OF PRESENT ILLNESS:  The patient is a 65 year old white female who had fairly abrupt onset of coffee-ground appearing emesis on 2 occasions.  I cannot tell whether it was documented to be heme-positive or not.  On presentation, her hemoglobin was 12.4 with WBC of 18,000. Today, her hemoglobin was 12.5 with WBC of 14,400.  She has not had any dark stools.  She states she was chronically constipated.  She had an EGD done for similar presentation in 2007 which showed distal esophagitis and no definite Mallory-Weiss tear or ulcer.  She does not use nonsteroidal anti-inflammatory drugs.  She is not on any acid suppression.  PAST MEDICAL HISTORY: 1. Type 2 diabetes. 2. Hyperlipidemia. 3. Renal insufficiency. 4. History of colon polyps in 2008. 5. Anxiety. 6. Depression. 7. Diabetic retinopathy. 8. Coronary artery disease.  SURGERIES:  None listed.  SOCIAL HISTORY:  The patient denies alcohol or tobacco use.  ALLERGIES:  ASPIRIN, CODEINE AND VIOXX.  MEDICATIONS:  Calcitriol, clonidine, glimepiride, Lasix, Synthroid, lorazepam, metoclopramide, metoprolol, Vytorin.  PHYSICAL EXAMINATION:  GENERAL:  Moderately obese white female in no acute distress. HEART:  Regular rate and rhythm without murmurs. LUNGS:  Clear. ABDOMEN:  Soft, nondistended with normoactive bowel sounds.  No hepatosplenomegaly, mass or guarding.  IMPRESSION:  Vomiting with suggestion of low grade upper gastrointestinal bleeding.  PLAN:  We will go ahead and repeat endoscopy while she is in the hospital, and monitor stools and hemoglobin  or any further emesis. Treat with IV Protonix.          ______________________________ Elyse Jarvis Amedeo Plenty, M.D.     JCH/MEDQ  D:  01/09/2011  T:  01/09/2011  Job:  [id number]  Electronically Signed by Teena Irani M.D. on 02/14/2011 11:39:41 AM

## 2011-02-15 LAB — CBC
HCT: 37.8
Hemoglobin: 12.6
RBC: 4.37
RDW: 15.6 — ABNORMAL HIGH
WBC: 6.9

## 2011-02-16 ENCOUNTER — Other Ambulatory Visit: Payer: Self-pay | Admitting: Nephrology

## 2011-02-16 ENCOUNTER — Encounter (HOSPITAL_COMMUNITY): Payer: Medicare Other

## 2011-02-16 LAB — IRON AND TIBC
Iron: 74 ug/dL (ref 42–135)
Saturation Ratios: 35 % (ref 20–55)
TIBC: 334
UIBC: 138 ug/dL (ref 125–400)

## 2011-02-16 LAB — CBC
HCT: 35.5 — ABNORMAL LOW
Hemoglobin: 12.1
MCHC: 33.9
RBC: 4.08
RDW: 15.7 — ABNORMAL HIGH

## 2011-02-16 LAB — POCT HEMOGLOBIN-HEMACUE: Hemoglobin: 10.7 g/dL — ABNORMAL LOW (ref 12.0–15.0)

## 2011-02-16 LAB — FERRITIN: Ferritin: 107 (ref 10–291)

## 2011-02-17 LAB — CBC
Hemoglobin: 11.6 — ABNORMAL LOW
MCHC: 33.3
MCV: 87.7
RBC: 3.97
RDW: 15.9 — ABNORMAL HIGH

## 2011-02-18 ENCOUNTER — Other Ambulatory Visit (INDEPENDENT_AMBULATORY_CARE_PROVIDER_SITE_OTHER): Payer: Medicare Other | Admitting: *Deleted

## 2011-02-18 DIAGNOSIS — R011 Cardiac murmur, unspecified: Secondary | ICD-10-CM

## 2011-02-18 DIAGNOSIS — I4892 Unspecified atrial flutter: Secondary | ICD-10-CM

## 2011-02-18 HISTORY — PX: TRANSTHORACIC ECHOCARDIOGRAM: SHX275

## 2011-02-18 LAB — CBC
MCHC: 34.8
MCV: 88.5
Platelets: 160
Platelets: 168
RBC: 3.79 — ABNORMAL LOW
WBC: 6.8

## 2011-02-19 LAB — CBC
HCT: 33.5 — ABNORMAL LOW
Hemoglobin: 11.4 — ABNORMAL LOW
Hemoglobin: 11.2 — ABNORMAL LOW
Hemoglobin: 13.6
MCHC: 33
MCHC: 33.3
MCHC: 33.5
MCV: 88.3
MCV: 91.1
Platelets: 176
RBC: 3.79 — ABNORMAL LOW
RBC: 3.67 — ABNORMAL LOW
RBC: 4.25
RBC: 4.51
RDW: 15.2
RDW: 15.4
WBC: 5.4
WBC: 12.9 — ABNORMAL HIGH
WBC: 7.7

## 2011-02-19 LAB — IRON AND TIBC
Saturation Ratios: 23
UIBC: 243

## 2011-02-19 LAB — DIFFERENTIAL
Basophils Relative: 0
Eosinophils Absolute: 0
Eosinophils Relative: 1
Lymphs Abs: 1

## 2011-02-19 LAB — URINALYSIS, ROUTINE W REFLEX MICROSCOPIC
Leukocytes, UA: NEGATIVE
Nitrite: NEGATIVE
Specific Gravity, Urine: 1.018
pH: 5.5

## 2011-02-19 LAB — URINE MICROSCOPIC-ADD ON

## 2011-02-19 LAB — BASIC METABOLIC PANEL
Calcium: 8.8
Creatinine, Ser: 1.18
GFR calc Af Amer: 56 — ABNORMAL LOW

## 2011-02-19 LAB — COMPREHENSIVE METABOLIC PANEL
ALT: 15
AST: 29
Alkaline Phosphatase: 75
CO2: 22
Calcium: 9.2
Chloride: 105
GFR calc Af Amer: 46 — ABNORMAL LOW
GFR calc non Af Amer: 38 — ABNORMAL LOW
Potassium: 4
Sodium: 140

## 2011-02-19 LAB — FERRITIN: Ferritin: 117 (ref 10–291)

## 2011-02-19 LAB — PROTIME-INR
INR: 1.1
Prothrombin Time: 14.5

## 2011-02-19 LAB — LIPASE, BLOOD: Lipase: 18

## 2011-02-19 LAB — OCCULT BLOOD X 1 CARD TO LAB, STOOL: Fecal Occult Bld: POSITIVE

## 2011-02-22 ENCOUNTER — Encounter: Payer: Self-pay | Admitting: Cardiology

## 2011-02-23 LAB — IRON AND TIBC
Saturation Ratios: 23 % (ref 20–55)
UIBC: 249 ug/dL

## 2011-02-23 LAB — POCT HEMOGLOBIN-HEMACUE: Hemoglobin: 11.5 g/dL — ABNORMAL LOW (ref 12.0–15.0)

## 2011-02-23 LAB — HEMOGLOBIN AND HEMATOCRIT, BLOOD
HCT: 34.2 % — ABNORMAL LOW (ref 36.0–46.0)
Hemoglobin: 11.6 g/dL — ABNORMAL LOW (ref 12.0–15.0)

## 2011-02-24 LAB — BASIC METABOLIC PANEL
BUN: 14
BUN: 15
BUN: 16
CO2: 22
Chloride: 106
Chloride: 96
Chloride: 98
Creatinine, Ser: 1.18
GFR calc non Af Amer: 41 — ABNORMAL LOW
Glucose, Bld: 122 — ABNORMAL HIGH
Glucose, Bld: 131 — ABNORMAL HIGH
Glucose, Bld: 94
Potassium: 3 — ABNORMAL LOW
Potassium: 3.7
Potassium: 3.7
Sodium: 132 — ABNORMAL LOW
Sodium: 137

## 2011-02-24 LAB — URINALYSIS, ROUTINE W REFLEX MICROSCOPIC
Glucose, UA: 250 — AB
Leukocytes, UA: NEGATIVE
Protein, ur: 100 — AB
Urobilinogen, UA: 0.2

## 2011-02-24 LAB — CBC
HCT: 38.9
HCT: 39.1
HCT: 41.4
HCT: 41.7
HCT: 41.8
Hemoglobin: 13
Hemoglobin: 13.8
Hemoglobin: 13.8
Hemoglobin: 14
MCHC: 33
MCHC: 33.2
MCHC: 33.7
MCV: 88.7
MCV: 89.7
Platelets: 216
Platelets: 221
Platelets: 228
RBC: 4.65
RBC: 4.69
RDW: 14.1
RDW: 14.1
RDW: 14.6
WBC: 10.6 — ABNORMAL HIGH
WBC: 10.9 — ABNORMAL HIGH
WBC: 12.4 — ABNORMAL HIGH
WBC: 9.3

## 2011-02-24 LAB — COMPREHENSIVE METABOLIC PANEL
ALT: 13
AST: 21
Albumin: 4
Calcium: 9.4
GFR calc Af Amer: 29 — ABNORMAL LOW
Glucose, Bld: 280 — ABNORMAL HIGH
Sodium: 138
Total Protein: 7.5

## 2011-02-24 LAB — DIFFERENTIAL
Eosinophils Absolute: 0
Lymphs Abs: 1.1
Monocytes Relative: 4
Neutrophils Relative %: 83 — ABNORMAL HIGH

## 2011-02-24 LAB — GLUCOSE, CAPILLARY
Glucose-Capillary: 148 — ABNORMAL HIGH
Glucose-Capillary: 154 — ABNORMAL HIGH
Glucose-Capillary: 182 — ABNORMAL HIGH
Glucose-Capillary: 195 — ABNORMAL HIGH

## 2011-02-24 LAB — URINE MICROSCOPIC-ADD ON

## 2011-02-24 LAB — PHOSPHORUS: Phosphorus: 2.7

## 2011-02-24 LAB — MAGNESIUM: Magnesium: 2.1

## 2011-02-24 LAB — POCT CARDIAC MARKERS: Myoglobin, poc: 87.5

## 2011-02-25 LAB — POCT HEMOGLOBIN-HEMACUE
Hemoglobin: 11.8 g/dL — ABNORMAL LOW (ref 12.0–15.0)
Hemoglobin: 11.9 g/dL — ABNORMAL LOW (ref 12.0–15.0)

## 2011-02-26 LAB — CBC
HCT: 34.1 — ABNORMAL LOW
Hemoglobin: 11.5 — ABNORMAL LOW
RBC: 4.01
RDW: 15.4
WBC: 5.9

## 2011-02-26 LAB — IRON AND TIBC
Saturation Ratios: 25
TIBC: 302
UIBC: 225

## 2011-03-02 LAB — CBC
MCV: 84.6
Platelets: 193
WBC: 6.8

## 2011-03-03 ENCOUNTER — Encounter: Payer: Self-pay | Admitting: Cardiology

## 2011-03-03 ENCOUNTER — Ambulatory Visit (INDEPENDENT_AMBULATORY_CARE_PROVIDER_SITE_OTHER): Payer: Medicare Other | Admitting: Cardiology

## 2011-03-03 DIAGNOSIS — N259 Disorder resulting from impaired renal tubular function, unspecified: Secondary | ICD-10-CM

## 2011-03-03 DIAGNOSIS — R6889 Other general symptoms and signs: Secondary | ICD-10-CM

## 2011-03-03 DIAGNOSIS — I10 Essential (primary) hypertension: Secondary | ICD-10-CM

## 2011-03-03 DIAGNOSIS — R899 Unspecified abnormal finding in specimens from other organs, systems and tissues: Secondary | ICD-10-CM

## 2011-03-03 DIAGNOSIS — E785 Hyperlipidemia, unspecified: Secondary | ICD-10-CM

## 2011-03-03 DIAGNOSIS — E669 Obesity, unspecified: Secondary | ICD-10-CM

## 2011-03-03 DIAGNOSIS — R609 Edema, unspecified: Secondary | ICD-10-CM

## 2011-03-03 LAB — CBC
HCT: 36.4
Platelets: 193
RBC: 4.25
WBC: 7.5

## 2011-03-03 MED ORDER — CLONIDINE HCL 0.2 MG PO TABS: 0.2000 mg | ORAL_TABLET | Freq: Every day | ORAL | Status: DC

## 2011-03-03 NOTE — Assessment & Plan Note (Signed)
This will be followed by Chevis Pretty, FNP, FNP

## 2011-03-03 NOTE — Assessment & Plan Note (Signed)
The patient understands the need to lose weight with diet and exercise. We have discussed specific strategies for this.  

## 2011-03-03 NOTE — Progress Notes (Signed)
HPI The patient is referred for evaluation of an abnormal  Pro BNP.  Her level was 34,325. At the last visit I ordered an BNP and an echocardiogram.  Echo demonstrated normal LV systolic function. She has mild aortic valve sclerosis.The BNP was mildly elevated (much different than the pro BNP). This is likely related some diastolic dysfunction plus the renal insufficiency.   She did have a low TSH and her thyroid replacement therapy was reduced.    Since that visit she has been walking.  The patient denies any new symptoms such as chest discomfort, neck or arm discomfort. There has been no new shortness of breath, PND or orthopnea. There have been no reported palpitations, presyncope or syncope.  Her lower extremity edema is about the same as previous.   Allergies  Allergen Reactions  . Aspirin   . Ciprofloxacin   . Codeine   . Micardis (Telmisartan)   . Rofecoxib     Current Outpatient Prescriptions  Medication Sig Dispense Refill  . calcitRIOL (ROCALTROL) 0.25 MCG capsule Take 0.25 mcg by mouth daily.        . cloNIDine (CATAPRES) 0.2 MG tablet Take 0.2 mg by mouth daily.        . furosemide (LASIX) 40 MG tablet Take 40 mg by mouth daily.        Marland Kitchen glimepiride (AMARYL) 4 MG tablet Take 2 mg by mouth. 2 po daily      . levothyroxine (SYNTHROID, LEVOTHROID) 25 MCG tablet Take 25 mcg by mouth daily.        Marland Kitchen losartan (COZAAR) 100 MG tablet Take 100 mg by mouth daily.        . metoCLOPramide (REGLAN) 5 MG tablet Take 5 mg by mouth 4 (four) times daily.        . metoprolol (TOPROL-XL) 50 MG 24 hr tablet Take 50 mg by mouth daily.        . pantoprazole (PROTONIX) 40 MG tablet Take 40 mg by mouth daily.          Past Medical History  Diagnosis Date  . Hypertension   . Hyperlipidemia   . Thyroid disease     hypothryoidism  . GERD (gastroesophageal reflux disease)   . GAD (generalized anxiety disorder)   . GIB (gastrointestinal bleeding)   . Diabetes mellitus   . CKD (chronic kidney  disease), stage III     Past Surgical History  Procedure Date  . None   . Incision and drainage perirectal abscess   . Retinopathy surgery     ROS:  Joint pain. Otherwise as stated in the HPI and negative for all other systems.  GENERAL:  Well appearing VS:  152/92    91 HEENT:  Pupils equal round and reactive, fundi not visualized, oral mucosa unremarkable NECK: No JVD at 45 degrees, waveform within normal limits, carotid upstroke brisk and symmetric, no bruits, no thyromegaly LYMPHATICS:  No cervical, inguinal adenopathy LUNGS:  Clear to auscultation bilaterally BACK:  No CVA tenderness CHEST:  Unremarkable HEART:  PMI not displaced or sustained,S1 and S2 within normal limits, no S3, no S4, no clicks, no rubs, soft apical systolic murmur ABD:  Flat, positive bowel sounds normal in frequency in pitch, no bruits, no rebound, no guarding, no midline pulsatile mass, no hepatomegaly, no splenomegaly, obese EXT:  2 plus pulses throughout, mild left greater than right lower extremity edema, no cyanosis no clubbing SKIN:  No rashes no nodules NEURO:  Cranial nerves II through XII  grossly intact, motor grossly intact throughout PSYCH:  Cognitively intact, oriented to person place and time   ASSESSMENT AND PLAN

## 2011-03-03 NOTE — Assessment & Plan Note (Signed)
The blood pressure is not quite at target.  I will increase the clonidine to bid.  She will keep her blood pressure diary.

## 2011-03-03 NOTE — Assessment & Plan Note (Signed)
The elevated proBNP is probably related to some diastolic dysfunction and CKD.  At this point, salt and fluid restriction and blood pressure control is the optimal therapy. No further testing is indicated.

## 2011-03-03 NOTE — Patient Instructions (Signed)
Please increase your Clonidine to twice a day  Continue all other medications as listed  Follow up in 1 year with Dr Percival Spanish.  You will receive a letter in the mail 2 months before you are due.  Please call us when you receive this letter to schedule your follow up appointment.

## 2011-03-04 LAB — CBC
HCT: 36
HCT: 34.4 — ABNORMAL LOW
Hemoglobin: 12
Hemoglobin: 11.4 — ABNORMAL LOW
MCHC: 33.2
MCV: 85.8
RDW: 15.1 — ABNORMAL HIGH
RDW: 14.7 — ABNORMAL HIGH
WBC: 6.2

## 2011-03-05 LAB — CBC
HCT: 33.6 — ABNORMAL LOW
Hemoglobin: 11.2 — ABNORMAL LOW
MCV: 85.3
RBC: 3.94
WBC: 5.4

## 2011-03-08 LAB — CBC
HCT: 33.8 — ABNORMAL LOW
MCHC: 33.2
MCV: 85.8
Platelets: 182
Platelets: 191
RDW: 16.1 — ABNORMAL HIGH
RDW: 16.1 — ABNORMAL HIGH

## 2011-03-09 ENCOUNTER — Encounter (HOSPITAL_COMMUNITY)
Admission: RE | Admit: 2011-03-09 | Discharge: 2011-03-09 | Disposition: A | Discharge status: Home / Self Care | Payer: Medicare Other | Source: Ambulatory Visit | Attending: Nephrology | Admitting: Nephrology

## 2011-03-09 ENCOUNTER — Other Ambulatory Visit: Payer: Self-pay | Admitting: Nephrology

## 2011-03-09 DIAGNOSIS — N183 Chronic kidney disease, stage 3 unspecified: Secondary | ICD-10-CM | POA: Insufficient documentation

## 2011-03-09 DIAGNOSIS — D638 Anemia in other chronic diseases classified elsewhere: Secondary | ICD-10-CM | POA: Insufficient documentation

## 2011-03-10 LAB — IRON AND TIBC
Iron: 65
TIBC: 280
UIBC: 215

## 2011-03-10 LAB — CBC
MCHC: 33.6
RBC: 4.03
WBC: 6.4

## 2011-03-10 LAB — FERRITIN: Ferritin: 122 (ref 10–291)

## 2011-03-11 LAB — CBC
HCT: 32.4 — ABNORMAL LOW
MCHC: 33.1
Platelets: 186
RDW: 17.1 — ABNORMAL HIGH

## 2011-03-26 ENCOUNTER — Other Ambulatory Visit (HOSPITAL_COMMUNITY): Payer: Self-pay | Admitting: *Deleted

## 2011-03-30 ENCOUNTER — Encounter (HOSPITAL_COMMUNITY): Payer: Medicare Other

## 2011-03-30 DIAGNOSIS — N183 Chronic kidney disease, stage 3 unspecified: Secondary | ICD-10-CM | POA: Insufficient documentation

## 2011-03-30 DIAGNOSIS — D638 Anemia in other chronic diseases classified elsewhere: Secondary | ICD-10-CM | POA: Insufficient documentation

## 2011-03-30 MED ORDER — DARBEPOETIN ALFA-POLYSORBATE 500 MCG/ML IJ SOLN
100.0000 ug | INTRAMUSCULAR | Status: AC
  Administered 2011-03-30: 100 ug via SUBCUTANEOUS
  Filled 2011-03-30: qty 1

## 2011-04-20 ENCOUNTER — Encounter (HOSPITAL_COMMUNITY)
Admission: RE | Admit: 2011-04-20 | Discharge: 2011-04-20 | Disposition: A | Discharge status: Home / Self Care | Payer: Medicare Other | Source: Ambulatory Visit | Attending: Nephrology | Admitting: Nephrology

## 2011-04-20 LAB — POCT HEMOGLOBIN-HEMACUE: Hemoglobin: 11.6 g/dL — ABNORMAL LOW (ref 12.0–15.0)

## 2011-04-20 LAB — IRON AND TIBC: TIBC: 234 ug/dL — ABNORMAL LOW (ref 250–470)

## 2011-04-20 MED ORDER — DARBEPOETIN ALFA-POLYSORBATE 100 MCG/0.5ML IJ SOLN
INTRAMUSCULAR | Status: AC
  Filled 2011-04-20: qty 0.5

## 2011-04-20 MED ORDER — DARBEPOETIN ALFA-POLYSORBATE 500 MCG/ML IJ SOLN
100.0000 ug | INTRAMUSCULAR | Status: DC
  Administered 2011-04-20: 100 ug via SUBCUTANEOUS

## 2011-04-20 MED FILL — Darbepoetin Alfa-Polysorbate 80 Soln Inj 100 MCG/0.5ML: INTRAMUSCULAR | Qty: 0.5 | Status: AC

## 2011-05-10 ENCOUNTER — Other Ambulatory Visit (HOSPITAL_COMMUNITY): Payer: Self-pay | Admitting: *Deleted

## 2011-05-11 ENCOUNTER — Encounter (HOSPITAL_COMMUNITY)
Admission: RE | Admit: 2011-05-11 | Discharge: 2011-05-11 | Disposition: A | Discharge status: Home / Self Care | Payer: Medicare Other | Source: Ambulatory Visit | Attending: Nephrology | Admitting: Nephrology

## 2011-05-11 DIAGNOSIS — N183 Chronic kidney disease, stage 3 unspecified: Secondary | ICD-10-CM | POA: Insufficient documentation

## 2011-05-11 DIAGNOSIS — D638 Anemia in other chronic diseases classified elsewhere: Secondary | ICD-10-CM | POA: Insufficient documentation

## 2011-05-11 MED ORDER — DARBEPOETIN ALFA-POLYSORBATE 500 MCG/ML IJ SOLN: 100.0000 ug | INTRAMUSCULAR | Status: DC

## 2011-05-27 ENCOUNTER — Encounter (HOSPITAL_COMMUNITY)
Admission: RE | Admit: 2011-05-27 | Discharge: 2011-05-27 | Disposition: A | Discharge status: Home / Self Care | Payer: Medicare Other | Source: Ambulatory Visit | Attending: Nephrology | Admitting: Nephrology

## 2011-05-27 DIAGNOSIS — D638 Anemia in other chronic diseases classified elsewhere: Secondary | ICD-10-CM | POA: Insufficient documentation

## 2011-05-27 DIAGNOSIS — N183 Chronic kidney disease, stage 3 unspecified: Secondary | ICD-10-CM | POA: Insufficient documentation

## 2011-05-27 LAB — IRON AND TIBC
Iron: 64 ug/dL (ref 42–135)
Saturation Ratios: 29 % (ref 20–55)
TIBC: 219 ug/dL — ABNORMAL LOW (ref 250–470)

## 2011-05-27 LAB — POCT HEMOGLOBIN-HEMACUE: Hemoglobin: 11.5 g/dL — ABNORMAL LOW (ref 12.0–15.0)

## 2011-05-27 LAB — FERRITIN: Ferritin: 772 ng/mL — ABNORMAL HIGH (ref 10–291)

## 2011-05-27 MED ORDER — DARBEPOETIN ALFA-POLYSORBATE 500 MCG/ML IJ SOLN
100.0000 ug | INTRAMUSCULAR | Status: DC
  Filled 2011-05-27: qty 1

## 2011-05-27 MED ORDER — DARBEPOETIN ALFA-POLYSORBATE 100 MCG/0.5ML IJ SOLN
INTRAMUSCULAR | Status: AC
  Administered 2011-05-27: 100 ug via SUBCUTANEOUS
  Filled 2011-05-27: qty 0.5

## 2011-06-15 ENCOUNTER — Encounter (HOSPITAL_COMMUNITY)
Admission: RE | Admit: 2011-06-15 | Discharge: 2011-06-15 | Disposition: A | Discharge status: Home / Self Care | Payer: Medicare Other | Source: Ambulatory Visit | Attending: Nephrology | Admitting: Nephrology

## 2011-06-15 DIAGNOSIS — D638 Anemia in other chronic diseases classified elsewhere: Secondary | ICD-10-CM | POA: Diagnosis not present

## 2011-06-15 MED ORDER — DARBEPOETIN ALFA-POLYSORBATE 100 MCG/0.5ML IJ SOLN
INTRAMUSCULAR | Status: AC
  Administered 2011-06-15: 100 ug via SUBCUTANEOUS
  Filled 2011-06-15: qty 0.5

## 2011-06-15 MED ORDER — DARBEPOETIN ALFA-POLYSORBATE 500 MCG/ML IJ SOLN: 100.0000 ug | INTRAMUSCULAR | Status: DC

## 2011-07-06 ENCOUNTER — Encounter (HOSPITAL_COMMUNITY)
Admission: RE | Admit: 2011-07-06 | Discharge: 2011-07-06 | Disposition: A | Discharge status: Home / Self Care | Payer: Medicare Other | Source: Ambulatory Visit | Attending: Nephrology | Admitting: Nephrology

## 2011-07-06 DIAGNOSIS — D638 Anemia in other chronic diseases classified elsewhere: Secondary | ICD-10-CM | POA: Diagnosis not present

## 2011-07-06 DIAGNOSIS — N183 Chronic kidney disease, stage 3 unspecified: Secondary | ICD-10-CM | POA: Insufficient documentation

## 2011-07-06 MED ORDER — DARBEPOETIN ALFA-POLYSORBATE 500 MCG/ML IJ SOLN: 100.0000 ug | INTRAMUSCULAR | Status: DC

## 2011-07-07 LAB — POCT HEMOGLOBIN-HEMACUE: Hemoglobin: 12.1 g/dL (ref 12.0–15.0)

## 2011-07-22 ENCOUNTER — Encounter (HOSPITAL_COMMUNITY)
Admission: RE | Admit: 2011-07-22 | Discharge: 2011-07-22 | Disposition: A | Discharge status: Home / Self Care | Payer: Medicare Other | Source: Ambulatory Visit | Attending: Nephrology | Admitting: Nephrology

## 2011-07-22 DIAGNOSIS — D638 Anemia in other chronic diseases classified elsewhere: Secondary | ICD-10-CM | POA: Diagnosis not present

## 2011-07-22 DIAGNOSIS — E11311 Type 2 diabetes mellitus with unspecified diabetic retinopathy with macular edema: Secondary | ICD-10-CM | POA: Diagnosis not present

## 2011-07-22 MED ORDER — CLONIDINE HCL 0.1 MG PO TABS
0.1000 mg | ORAL_TABLET | Freq: Once | ORAL | Status: AC
  Administered 2011-07-22: 0.1 mg via ORAL

## 2011-07-22 MED ORDER — CLONIDINE HCL 0.1 MG PO TABS
ORAL_TABLET | ORAL | Status: AC
  Administered 2011-07-22: 0.1 mg via ORAL
  Filled 2011-07-22: qty 1

## 2011-07-22 MED ORDER — DARBEPOETIN ALFA-POLYSORBATE 100 MCG/0.5ML IJ SOLN
INTRAMUSCULAR | Status: AC
  Administered 2011-07-22: 100 ug via SUBCUTANEOUS
  Filled 2011-07-22: qty 0.5

## 2011-07-22 MED ORDER — CLONIDINE HCL 0.1 MG PO TABS
0.1000 mg | ORAL_TABLET | Freq: Once | ORAL | Status: AC | PRN
  Administered 2011-07-22: 0.1 mg via ORAL

## 2011-07-22 MED ORDER — DARBEPOETIN ALFA-POLYSORBATE 500 MCG/ML IJ SOLN: 100.0000 ug | INTRAMUSCULAR | Status: DC

## 2011-07-27 ENCOUNTER — Encounter (HOSPITAL_COMMUNITY): Payer: Medicare Other

## 2011-07-30 DIAGNOSIS — R5381 Other malaise: Secondary | ICD-10-CM | POA: Diagnosis not present

## 2011-07-30 DIAGNOSIS — E785 Hyperlipidemia, unspecified: Secondary | ICD-10-CM | POA: Diagnosis not present

## 2011-07-30 DIAGNOSIS — D649 Anemia, unspecified: Secondary | ICD-10-CM | POA: Diagnosis not present

## 2011-07-30 DIAGNOSIS — E119 Type 2 diabetes mellitus without complications: Secondary | ICD-10-CM | POA: Diagnosis not present

## 2011-07-30 DIAGNOSIS — I10 Essential (primary) hypertension: Secondary | ICD-10-CM | POA: Diagnosis not present

## 2011-08-09 ENCOUNTER — Other Ambulatory Visit (HOSPITAL_COMMUNITY): Payer: Self-pay | Admitting: *Deleted

## 2011-08-10 ENCOUNTER — Encounter (HOSPITAL_COMMUNITY)
Admission: RE | Admit: 2011-08-10 | Discharge: 2011-08-10 | Disposition: A | Discharge status: Home / Self Care | Payer: Medicare Other | Source: Ambulatory Visit | Attending: Nephrology | Admitting: Nephrology

## 2011-08-10 DIAGNOSIS — D638 Anemia in other chronic diseases classified elsewhere: Secondary | ICD-10-CM | POA: Diagnosis not present

## 2011-08-10 DIAGNOSIS — N183 Chronic kidney disease, stage 3 unspecified: Secondary | ICD-10-CM | POA: Insufficient documentation

## 2011-08-10 LAB — IRON AND TIBC
Iron: 40 ug/dL — ABNORMAL LOW (ref 42–135)
TIBC: 236 ug/dL — ABNORMAL LOW (ref 250–470)

## 2011-08-10 LAB — POCT HEMOGLOBIN-HEMACUE: Hemoglobin: 12.3 g/dL (ref 12.0–15.0)

## 2011-08-10 MED ORDER — DARBEPOETIN ALFA-POLYSORBATE 500 MCG/ML IJ SOLN: 100.0000 ug | INTRAMUSCULAR | Status: DC

## 2011-08-11 DIAGNOSIS — E11311 Type 2 diabetes mellitus with unspecified diabetic retinopathy with macular edema: Secondary | ICD-10-CM | POA: Diagnosis not present

## 2011-08-11 DIAGNOSIS — N2581 Secondary hyperparathyroidism of renal origin: Secondary | ICD-10-CM | POA: Diagnosis not present

## 2011-08-11 DIAGNOSIS — E1139 Type 2 diabetes mellitus with other diabetic ophthalmic complication: Secondary | ICD-10-CM | POA: Diagnosis not present

## 2011-08-11 DIAGNOSIS — E11359 Type 2 diabetes mellitus with proliferative diabetic retinopathy without macular edema: Secondary | ICD-10-CM | POA: Diagnosis not present

## 2011-08-17 ENCOUNTER — Encounter (HOSPITAL_COMMUNITY): Payer: Medicare Other

## 2011-08-24 ENCOUNTER — Encounter (HOSPITAL_COMMUNITY)
Admission: RE | Admit: 2011-08-24 | Discharge: 2011-08-24 | Disposition: A | Discharge status: Home / Self Care | Payer: Medicare Other | Source: Ambulatory Visit | Attending: Nephrology | Admitting: Nephrology

## 2011-08-24 DIAGNOSIS — N183 Chronic kidney disease, stage 3 unspecified: Secondary | ICD-10-CM | POA: Diagnosis not present

## 2011-08-24 DIAGNOSIS — D638 Anemia in other chronic diseases classified elsewhere: Secondary | ICD-10-CM | POA: Diagnosis not present

## 2011-08-24 MED ORDER — DARBEPOETIN ALFA-POLYSORBATE 100 MCG/0.5ML IJ SOLN
INTRAMUSCULAR | Status: AC
  Administered 2011-08-24: 100 ug via SUBCUTANEOUS
  Filled 2011-08-24: qty 0.5

## 2011-08-24 MED ORDER — DARBEPOETIN ALFA-POLYSORBATE 500 MCG/ML IJ SOLN
100.0000 ug | INTRAMUSCULAR | Status: DC
  Filled 2011-08-24: qty 1

## 2011-09-14 ENCOUNTER — Encounter (HOSPITAL_COMMUNITY)
Admission: RE | Admit: 2011-09-14 | Discharge: 2011-09-14 | Disposition: A | Discharge status: Home / Self Care | Payer: Medicare Other | Source: Ambulatory Visit | Attending: Nephrology | Admitting: Nephrology

## 2011-09-14 MED ORDER — DARBEPOETIN ALFA-POLYSORBATE 100 MCG/0.5ML IJ SOLN
INTRAMUSCULAR | Status: AC
  Administered 2011-09-14: 100 ug via SUBCUTANEOUS
  Filled 2011-09-14: qty 0.5

## 2011-09-14 MED ORDER — DARBEPOETIN ALFA-POLYSORBATE 500 MCG/ML IJ SOLN
100.0000 ug | INTRAMUSCULAR | Status: DC
  Filled 2011-09-14: qty 1

## 2011-09-15 LAB — POCT HEMOGLOBIN-HEMACUE: Hemoglobin: 11.7 g/dL — ABNORMAL LOW (ref 12.0–15.0)

## 2011-10-05 ENCOUNTER — Encounter (HOSPITAL_COMMUNITY)
Admission: RE | Admit: 2011-10-05 | Discharge: 2011-10-05 | Disposition: A | Discharge status: Home / Self Care | Payer: Medicare Other | Source: Ambulatory Visit | Attending: Nephrology | Admitting: Nephrology

## 2011-10-05 DIAGNOSIS — D638 Anemia in other chronic diseases classified elsewhere: Secondary | ICD-10-CM | POA: Insufficient documentation

## 2011-10-05 DIAGNOSIS — N183 Chronic kidney disease, stage 3 unspecified: Secondary | ICD-10-CM | POA: Diagnosis not present

## 2011-10-05 LAB — IRON AND TIBC
Saturation Ratios: 17 % — ABNORMAL LOW (ref 20–55)
TIBC: 227 ug/dL — ABNORMAL LOW (ref 250–470)

## 2011-10-05 MED ORDER — DARBEPOETIN ALFA-POLYSORBATE 100 MCG/0.5ML IJ SOLN
INTRAMUSCULAR | Status: AC
  Administered 2011-10-05: 100 ug via SUBCUTANEOUS
  Filled 2011-10-05: qty 0.5

## 2011-10-05 MED ORDER — DARBEPOETIN ALFA-POLYSORBATE 500 MCG/ML IJ SOLN: 100.0000 ug | INTRAMUSCULAR | Status: DC

## 2011-10-26 ENCOUNTER — Encounter (HOSPITAL_COMMUNITY)
Admission: RE | Admit: 2011-10-26 | Discharge: 2011-10-26 | Disposition: A | Discharge status: Home / Self Care | Payer: Medicare Other | Source: Ambulatory Visit | Attending: Nephrology | Admitting: Nephrology

## 2011-10-26 DIAGNOSIS — D638 Anemia in other chronic diseases classified elsewhere: Secondary | ICD-10-CM | POA: Insufficient documentation

## 2011-10-26 DIAGNOSIS — N183 Chronic kidney disease, stage 3 unspecified: Secondary | ICD-10-CM | POA: Insufficient documentation

## 2011-10-26 LAB — POCT HEMOGLOBIN-HEMACUE: Hemoglobin: 12 g/dL (ref 12.0–15.0)

## 2011-10-26 MED ORDER — DARBEPOETIN ALFA-POLYSORBATE 500 MCG/ML IJ SOLN: 100.0000 ug | INTRAMUSCULAR | Status: DC

## 2011-11-09 ENCOUNTER — Encounter (HOSPITAL_COMMUNITY): Payer: Medicare Other

## 2011-11-11 ENCOUNTER — Encounter (HOSPITAL_COMMUNITY)
Admission: RE | Admit: 2011-11-11 | Discharge: 2011-11-11 | Disposition: A | Discharge status: Home / Self Care | Payer: Medicare Other | Source: Ambulatory Visit | Attending: Nephrology | Admitting: Nephrology

## 2011-11-11 DIAGNOSIS — N2581 Secondary hyperparathyroidism of renal origin: Secondary | ICD-10-CM | POA: Diagnosis not present

## 2011-11-11 DIAGNOSIS — D649 Anemia, unspecified: Secondary | ICD-10-CM | POA: Diagnosis not present

## 2011-11-11 DIAGNOSIS — N183 Chronic kidney disease, stage 3 unspecified: Secondary | ICD-10-CM | POA: Diagnosis not present

## 2011-11-11 DIAGNOSIS — D638 Anemia in other chronic diseases classified elsewhere: Secondary | ICD-10-CM | POA: Diagnosis not present

## 2011-11-11 MED ORDER — DARBEPOETIN ALFA-POLYSORBATE 100 MCG/0.5ML IJ SOLN
INTRAMUSCULAR | Status: AC
  Administered 2011-11-11: 100 ug via SUBCUTANEOUS
  Filled 2011-11-11: qty 0.5

## 2011-11-11 MED ORDER — DARBEPOETIN ALFA-POLYSORBATE 500 MCG/ML IJ SOLN
100.0000 ug | INTRAMUSCULAR | Status: DC
  Filled 2011-11-11: qty 1

## 2011-11-30 ENCOUNTER — Encounter (HOSPITAL_COMMUNITY)
Admission: RE | Admit: 2011-11-30 | Discharge: 2011-11-30 | Disposition: A | Discharge status: Home / Self Care | Payer: Medicare Other | Source: Ambulatory Visit | Attending: Nephrology | Admitting: Nephrology

## 2011-11-30 DIAGNOSIS — D638 Anemia in other chronic diseases classified elsewhere: Secondary | ICD-10-CM | POA: Diagnosis not present

## 2011-11-30 DIAGNOSIS — N183 Chronic kidney disease, stage 3 unspecified: Secondary | ICD-10-CM | POA: Insufficient documentation

## 2011-11-30 LAB — IRON AND TIBC
Saturation Ratios: 21 % (ref 20–55)
TIBC: 242 ug/dL — ABNORMAL LOW (ref 250–470)
UIBC: 191 ug/dL (ref 125–400)

## 2011-11-30 LAB — FERRITIN: Ferritin: 430 ng/mL — ABNORMAL HIGH (ref 10–291)

## 2011-11-30 LAB — POCT HEMOGLOBIN-HEMACUE: Hemoglobin: 11.2 g/dL — ABNORMAL LOW (ref 12.0–15.0)

## 2011-11-30 MED ORDER — DARBEPOETIN ALFA-POLYSORBATE 100 MCG/0.5ML IJ SOLN
INTRAMUSCULAR | Status: AC
  Administered 2011-11-30: 100 ug via SUBCUTANEOUS
  Filled 2011-11-30: qty 0.5

## 2011-11-30 MED ORDER — DARBEPOETIN ALFA-POLYSORBATE 500 MCG/ML IJ SOLN
100.0000 ug | INTRAMUSCULAR | Status: DC
  Filled 2011-11-30: qty 1

## 2011-12-02 ENCOUNTER — Encounter (HOSPITAL_COMMUNITY): Payer: Medicare Other

## 2011-12-14 DIAGNOSIS — H353190 Nonexudative age-related macular degeneration, unspecified eye, stage unspecified: Secondary | ICD-10-CM | POA: Diagnosis not present

## 2011-12-14 DIAGNOSIS — E11359 Type 2 diabetes mellitus with proliferative diabetic retinopathy without macular edema: Secondary | ICD-10-CM | POA: Diagnosis not present

## 2011-12-14 DIAGNOSIS — H35359 Cystoid macular degeneration, unspecified eye: Secondary | ICD-10-CM | POA: Diagnosis not present

## 2011-12-14 DIAGNOSIS — E1139 Type 2 diabetes mellitus with other diabetic ophthalmic complication: Secondary | ICD-10-CM | POA: Diagnosis not present

## 2011-12-20 ENCOUNTER — Other Ambulatory Visit (HOSPITAL_COMMUNITY): Payer: Self-pay | Admitting: *Deleted

## 2011-12-20 DIAGNOSIS — E559 Vitamin D deficiency, unspecified: Secondary | ICD-10-CM | POA: Diagnosis not present

## 2011-12-20 DIAGNOSIS — E039 Hypothyroidism, unspecified: Secondary | ICD-10-CM | POA: Diagnosis not present

## 2011-12-20 DIAGNOSIS — I10 Essential (primary) hypertension: Secondary | ICD-10-CM | POA: Diagnosis not present

## 2011-12-20 DIAGNOSIS — E785 Hyperlipidemia, unspecified: Secondary | ICD-10-CM | POA: Diagnosis not present

## 2011-12-20 DIAGNOSIS — D649 Anemia, unspecified: Secondary | ICD-10-CM | POA: Diagnosis not present

## 2011-12-21 ENCOUNTER — Encounter (HOSPITAL_COMMUNITY)
Admission: RE | Admit: 2011-12-21 | Discharge: 2011-12-21 | Disposition: A | Discharge status: Home / Self Care | Payer: Medicare Other | Source: Ambulatory Visit | Attending: Nephrology | Admitting: Nephrology

## 2011-12-21 LAB — POCT HEMOGLOBIN-HEMACUE: Hemoglobin: 11.8 g/dL — ABNORMAL LOW (ref 12.0–15.0)

## 2011-12-21 MED ORDER — DARBEPOETIN ALFA-POLYSORBATE 100 MCG/0.5ML IJ SOLN
INTRAMUSCULAR | Status: AC
  Filled 2011-12-21: qty 0.5

## 2011-12-21 MED ORDER — DARBEPOETIN ALFA-POLYSORBATE 500 MCG/ML IJ SOLN
100.0000 ug | INTRAMUSCULAR | Status: DC
  Administered 2011-12-21: 100 ug via SUBCUTANEOUS
  Filled 2011-12-21: qty 1

## 2011-12-21 MED FILL — Darbepoetin Alfa-Polysorbate 80 Soln Inj 100 MCG/0.5ML: INTRAMUSCULAR | Qty: 0.5 | Status: AC

## 2011-12-24 ENCOUNTER — Other Ambulatory Visit (HOSPITAL_COMMUNITY): Payer: Self-pay | Admitting: *Deleted

## 2011-12-28 ENCOUNTER — Encounter (HOSPITAL_COMMUNITY): Payer: Medicare Other

## 2012-01-06 DIAGNOSIS — E119 Type 2 diabetes mellitus without complications: Secondary | ICD-10-CM | POA: Diagnosis not present

## 2012-01-06 DIAGNOSIS — L609 Nail disorder, unspecified: Secondary | ICD-10-CM | POA: Diagnosis not present

## 2012-01-11 ENCOUNTER — Encounter (HOSPITAL_COMMUNITY)
Admission: RE | Admit: 2012-01-11 | Discharge: 2012-01-11 | Disposition: A | Discharge status: Home / Self Care | Payer: Medicare Other | Source: Ambulatory Visit | Attending: Nephrology | Admitting: Nephrology

## 2012-01-11 DIAGNOSIS — N183 Chronic kidney disease, stage 3 unspecified: Secondary | ICD-10-CM | POA: Insufficient documentation

## 2012-01-11 DIAGNOSIS — D638 Anemia in other chronic diseases classified elsewhere: Secondary | ICD-10-CM | POA: Diagnosis not present

## 2012-01-11 MED ORDER — DARBEPOETIN ALFA-POLYSORBATE 100 MCG/0.5ML IJ SOLN: 100.0000 ug | INTRAMUSCULAR | Status: DC

## 2012-01-25 ENCOUNTER — Encounter (HOSPITAL_COMMUNITY)
Admission: RE | Admit: 2012-01-25 | Discharge: 2012-01-25 | Disposition: A | Discharge status: Home / Self Care | Payer: Medicare Other | Source: Ambulatory Visit | Attending: Nephrology | Admitting: Nephrology

## 2012-01-25 DIAGNOSIS — N183 Chronic kidney disease, stage 3 unspecified: Secondary | ICD-10-CM | POA: Diagnosis not present

## 2012-01-25 DIAGNOSIS — D638 Anemia in other chronic diseases classified elsewhere: Secondary | ICD-10-CM | POA: Diagnosis not present

## 2012-01-25 MED ORDER — DARBEPOETIN ALFA-POLYSORBATE 100 MCG/0.5ML IJ SOLN: 100.0000 ug | INTRAMUSCULAR | Status: DC

## 2012-02-08 ENCOUNTER — Encounter (HOSPITAL_COMMUNITY): Payer: Medicare Other

## 2012-02-14 ENCOUNTER — Other Ambulatory Visit (HOSPITAL_COMMUNITY): Payer: Self-pay | Admitting: *Deleted

## 2012-02-15 ENCOUNTER — Encounter (HOSPITAL_COMMUNITY)
Admission: RE | Admit: 2012-02-15 | Discharge: 2012-02-15 | Disposition: A | Discharge status: Home / Self Care | Payer: Medicare Other | Source: Ambulatory Visit | Attending: Nephrology | Admitting: Nephrology

## 2012-02-15 DIAGNOSIS — N2581 Secondary hyperparathyroidism of renal origin: Secondary | ICD-10-CM | POA: Diagnosis not present

## 2012-02-15 DIAGNOSIS — D649 Anemia, unspecified: Secondary | ICD-10-CM | POA: Diagnosis not present

## 2012-02-15 MED ORDER — DARBEPOETIN ALFA-POLYSORBATE 100 MCG/0.5ML IJ SOLN: 100.0000 ug | INTRAMUSCULAR | Status: DC

## 2012-02-15 MED ORDER — DARBEPOETIN ALFA-POLYSORBATE 100 MCG/0.5ML IJ SOLN: 100.0000 ug | Freq: Once | INTRAMUSCULAR | Status: DC

## 2012-02-15 MED ORDER — DARBEPOETIN ALFA-POLYSORBATE 100 MCG/0.5ML IJ SOLN
INTRAMUSCULAR | Status: AC
  Administered 2012-02-15: 100 ug via SUBCUTANEOUS
  Filled 2012-02-15: qty 0.5

## 2012-02-15 NOTE — Progress Notes (Signed)
hgb results sent from [facility] (11.2). New orders sent by Dr. Mercy Moore with patient. Verified orders with Gustavus Bryant, CMA. To fax orders here as well.

## 2012-03-14 ENCOUNTER — Encounter (HOSPITAL_COMMUNITY)
Admission: RE | Admit: 2012-03-14 | Discharge: 2012-03-14 | Disposition: A | Discharge status: Home / Self Care | Payer: Medicare Other | Source: Ambulatory Visit | Attending: Nephrology | Admitting: Nephrology

## 2012-03-14 DIAGNOSIS — D638 Anemia in other chronic diseases classified elsewhere: Secondary | ICD-10-CM | POA: Diagnosis not present

## 2012-03-14 DIAGNOSIS — N183 Chronic kidney disease, stage 3 unspecified: Secondary | ICD-10-CM | POA: Diagnosis not present

## 2012-03-14 MED ORDER — DARBEPOETIN ALFA-POLYSORBATE 100 MCG/0.5ML IJ SOLN
100.0000 ug | INTRAMUSCULAR | Status: DC
  Administered 2012-03-14: 100 ug via SUBCUTANEOUS

## 2012-03-14 MED ORDER — DARBEPOETIN ALFA-POLYSORBATE 100 MCG/0.5ML IJ SOLN
INTRAMUSCULAR | Status: AC
  Filled 2012-03-14: qty 0.5

## 2012-04-11 ENCOUNTER — Encounter (HOSPITAL_COMMUNITY)
Admission: RE | Admit: 2012-04-11 | Discharge: 2012-04-11 | Disposition: A | Discharge status: Home / Self Care | Payer: Medicare Other | Source: Ambulatory Visit | Attending: Nephrology | Admitting: Nephrology

## 2012-04-11 DIAGNOSIS — D638 Anemia in other chronic diseases classified elsewhere: Secondary | ICD-10-CM | POA: Diagnosis not present

## 2012-04-11 DIAGNOSIS — N183 Chronic kidney disease, stage 3 unspecified: Secondary | ICD-10-CM | POA: Insufficient documentation

## 2012-04-11 MED ORDER — DARBEPOETIN ALFA-POLYSORBATE 100 MCG/0.5ML IJ SOLN: 100.0000 ug | INTRAMUSCULAR | Status: DC

## 2012-04-14 DIAGNOSIS — I2510 Atherosclerotic heart disease of native coronary artery without angina pectoris: Secondary | ICD-10-CM | POA: Diagnosis not present

## 2012-04-14 DIAGNOSIS — Z23 Encounter for immunization: Secondary | ICD-10-CM | POA: Diagnosis not present

## 2012-04-14 DIAGNOSIS — E119 Type 2 diabetes mellitus without complications: Secondary | ICD-10-CM | POA: Diagnosis not present

## 2012-04-14 DIAGNOSIS — E039 Hypothyroidism, unspecified: Secondary | ICD-10-CM | POA: Diagnosis not present

## 2012-04-14 DIAGNOSIS — E785 Hyperlipidemia, unspecified: Secondary | ICD-10-CM | POA: Diagnosis not present

## 2012-04-14 DIAGNOSIS — R5381 Other malaise: Secondary | ICD-10-CM | POA: Diagnosis not present

## 2012-04-14 DIAGNOSIS — R5383 Other fatigue: Secondary | ICD-10-CM | POA: Diagnosis not present

## 2012-04-14 DIAGNOSIS — I10 Essential (primary) hypertension: Secondary | ICD-10-CM | POA: Diagnosis not present

## 2012-04-25 ENCOUNTER — Encounter (HOSPITAL_COMMUNITY)
Admission: RE | Admit: 2012-04-25 | Discharge: 2012-04-25 | Disposition: A | Discharge status: Home / Self Care | Payer: Medicare Other | Source: Ambulatory Visit | Attending: Nephrology | Admitting: Nephrology

## 2012-04-25 DIAGNOSIS — D638 Anemia in other chronic diseases classified elsewhere: Secondary | ICD-10-CM | POA: Diagnosis not present

## 2012-04-25 DIAGNOSIS — N183 Chronic kidney disease, stage 3 unspecified: Secondary | ICD-10-CM | POA: Diagnosis not present

## 2012-04-25 LAB — IRON AND TIBC
Iron: 70 ug/dL (ref 42–135)
TIBC: 244 ug/dL — ABNORMAL LOW (ref 250–470)
UIBC: 174 ug/dL (ref 125–400)

## 2012-04-25 LAB — FERRITIN: Ferritin: 390 ng/mL — ABNORMAL HIGH (ref 10–291)

## 2012-04-25 MED ORDER — DARBEPOETIN ALFA-POLYSORBATE 100 MCG/0.5ML IJ SOLN
INTRAMUSCULAR | Status: AC
  Administered 2012-04-25: 100 ug via SUBCUTANEOUS
  Filled 2012-04-25: qty 0.5

## 2012-04-25 MED ORDER — DARBEPOETIN ALFA-POLYSORBATE 100 MCG/0.5ML IJ SOLN
100.0000 ug | INTRAMUSCULAR | Status: DC
  Administered 2012-04-25: 100 ug via SUBCUTANEOUS

## 2012-05-22 ENCOUNTER — Other Ambulatory Visit (HOSPITAL_COMMUNITY): Payer: Self-pay | Admitting: *Deleted

## 2012-05-23 ENCOUNTER — Encounter (HOSPITAL_COMMUNITY)
Admission: RE | Admit: 2012-05-23 | Discharge: 2012-05-23 | Disposition: A | Discharge status: Home / Self Care | Payer: Medicare Other | Source: Ambulatory Visit | Attending: Nephrology | Admitting: Nephrology

## 2012-05-23 MED ORDER — DARBEPOETIN ALFA-POLYSORBATE 100 MCG/0.5ML IJ SOLN
INTRAMUSCULAR | Status: AC
  Administered 2012-05-23: 100 ug via SUBCUTANEOUS
  Filled 2012-05-23: qty 0.5

## 2012-05-23 MED ORDER — DARBEPOETIN ALFA-POLYSORBATE 100 MCG/0.5ML IJ SOLN
100.0000 ug | INTRAMUSCULAR | Status: DC
  Administered 2012-05-23: 100 ug via SUBCUTANEOUS

## 2012-06-07 DIAGNOSIS — N2581 Secondary hyperparathyroidism of renal origin: Secondary | ICD-10-CM | POA: Diagnosis not present

## 2012-06-08 DIAGNOSIS — R7989 Other specified abnormal findings of blood chemistry: Secondary | ICD-10-CM | POA: Diagnosis not present

## 2012-06-20 ENCOUNTER — Encounter (HOSPITAL_COMMUNITY)
Admission: RE | Admit: 2012-06-20 | Discharge: 2012-06-20 | Disposition: A | Discharge status: Home / Self Care | Payer: Medicare Other | Source: Ambulatory Visit | Attending: Nephrology | Admitting: Nephrology

## 2012-06-20 DIAGNOSIS — N183 Chronic kidney disease, stage 3 unspecified: Secondary | ICD-10-CM | POA: Insufficient documentation

## 2012-06-20 DIAGNOSIS — D638 Anemia in other chronic diseases classified elsewhere: Secondary | ICD-10-CM | POA: Insufficient documentation

## 2012-06-20 LAB — POCT HEMOGLOBIN-HEMACUE: Hemoglobin: 11.5 g/dL — ABNORMAL LOW (ref 12.0–15.0)

## 2012-06-20 MED ORDER — DARBEPOETIN ALFA-POLYSORBATE 100 MCG/0.5ML IJ SOLN
INTRAMUSCULAR | Status: AC
  Administered 2012-06-20: 100 ug via SUBCUTANEOUS
  Filled 2012-06-20: qty 0.5

## 2012-06-20 MED ORDER — DARBEPOETIN ALFA-POLYSORBATE 100 MCG/0.5ML IJ SOLN
100.0000 ug | INTRAMUSCULAR | Status: DC
  Administered 2012-06-20: 100 ug via SUBCUTANEOUS

## 2012-07-18 ENCOUNTER — Encounter (HOSPITAL_COMMUNITY)
Admission: RE | Admit: 2012-07-18 | Discharge: 2012-07-18 | Disposition: A | Discharge status: Home / Self Care | Payer: Medicare Other | Source: Ambulatory Visit | Attending: Nephrology | Admitting: Nephrology

## 2012-07-18 DIAGNOSIS — N183 Chronic kidney disease, stage 3 unspecified: Secondary | ICD-10-CM | POA: Diagnosis not present

## 2012-07-18 DIAGNOSIS — D638 Anemia in other chronic diseases classified elsewhere: Secondary | ICD-10-CM | POA: Diagnosis not present

## 2012-07-18 LAB — POCT HEMOGLOBIN-HEMACUE: Hemoglobin: 11.3 g/dL — ABNORMAL LOW (ref 12.0–15.0)

## 2012-07-18 MED ORDER — DARBEPOETIN ALFA-POLYSORBATE 100 MCG/0.5ML IJ SOLN
INTRAMUSCULAR | Status: AC
  Administered 2012-07-18: 100 ug via SUBCUTANEOUS
  Filled 2012-07-18: qty 0.5

## 2012-07-18 MED ORDER — DARBEPOETIN ALFA-POLYSORBATE 100 MCG/0.5ML IJ SOLN
100.0000 ug | INTRAMUSCULAR | Status: DC
  Administered 2012-07-18: 100 ug via SUBCUTANEOUS

## 2012-08-15 ENCOUNTER — Encounter (HOSPITAL_COMMUNITY)
Admission: RE | Admit: 2012-08-15 | Discharge: 2012-08-15 | Disposition: A | Discharge status: Home / Self Care | Payer: Medicare Other | Source: Ambulatory Visit | Attending: Nephrology | Admitting: Nephrology

## 2012-08-15 DIAGNOSIS — D638 Anemia in other chronic diseases classified elsewhere: Secondary | ICD-10-CM | POA: Insufficient documentation

## 2012-08-15 DIAGNOSIS — N183 Chronic kidney disease, stage 3 unspecified: Secondary | ICD-10-CM | POA: Diagnosis not present

## 2012-08-15 LAB — POCT HEMOGLOBIN-HEMACUE: Hemoglobin: 11.5 g/dL — ABNORMAL LOW (ref 12.0–15.0)

## 2012-08-15 LAB — IRON AND TIBC
Iron: 77 ug/dL (ref 42–135)
Saturation Ratios: 25 % (ref 20–55)
TIBC: 303 ug/dL (ref 250–470)
UIBC: 226 ug/dL (ref 125–400)

## 2012-08-15 MED ORDER — CLONIDINE HCL 0.1 MG PO TABS
ORAL_TABLET | ORAL | Status: AC
  Filled 2012-08-15: qty 1

## 2012-08-15 MED ORDER — CLONIDINE HCL 0.1 MG PO TABS
0.1000 mg | ORAL_TABLET | Freq: Once | ORAL | Status: AC
  Administered 2012-08-15: 0.1 mg via ORAL

## 2012-08-15 MED ORDER — DARBEPOETIN ALFA-POLYSORBATE 100 MCG/0.5ML IJ SOLN
100.0000 ug | INTRAMUSCULAR | Status: DC
Start: 2012-08-15 — End: 2012-08-16
  Administered 2012-08-15: 100 ug via SUBCUTANEOUS

## 2012-08-15 MED ORDER — DARBEPOETIN ALFA-POLYSORBATE 100 MCG/0.5ML IJ SOLN
INTRAMUSCULAR | Status: AC
  Administered 2012-08-15: 100 ug via SUBCUTANEOUS
  Filled 2012-08-15: qty 0.5

## 2012-08-15 MED ORDER — DARBEPOETIN ALFA-POLYSORBATE 100 MCG/0.5ML IJ SOLN
INTRAMUSCULAR | Status: AC
  Filled 2012-08-15: qty 0.5

## 2012-09-11 ENCOUNTER — Ambulatory Visit (INDEPENDENT_AMBULATORY_CARE_PROVIDER_SITE_OTHER): Payer: Medicare Other | Admitting: Nurse Practitioner

## 2012-09-11 ENCOUNTER — Other Ambulatory Visit: Payer: Self-pay | Admitting: *Deleted

## 2012-09-11 ENCOUNTER — Telehealth: Payer: Self-pay | Admitting: Nurse Practitioner

## 2012-09-11 ENCOUNTER — Other Ambulatory Visit (HOSPITAL_COMMUNITY): Payer: Self-pay | Admitting: *Deleted

## 2012-09-11 ENCOUNTER — Encounter: Payer: Self-pay | Admitting: Nurse Practitioner

## 2012-09-11 ENCOUNTER — Encounter: Payer: Self-pay | Admitting: *Deleted

## 2012-09-11 VITALS — BP 221/95 | HR 71 | Temp 97.5°F | Ht 62.0 in | Wt 182.0 lb

## 2012-09-11 DIAGNOSIS — E119 Type 2 diabetes mellitus without complications: Secondary | ICD-10-CM

## 2012-09-11 DIAGNOSIS — I10 Essential (primary) hypertension: Secondary | ICD-10-CM | POA: Diagnosis not present

## 2012-09-11 DIAGNOSIS — Z780 Asymptomatic menopausal state: Secondary | ICD-10-CM

## 2012-09-11 DIAGNOSIS — E039 Hypothyroidism, unspecified: Secondary | ICD-10-CM

## 2012-09-11 DIAGNOSIS — E785 Hyperlipidemia, unspecified: Secondary | ICD-10-CM | POA: Diagnosis not present

## 2012-09-11 LAB — COMPLETE METABOLIC PANEL WITH GFR
ALT: <8 U/L (ref 0–35)
AST: 12 U/L (ref 0–37)
Albumin: 3.8 g/dL (ref 3.5–5.2)
Calcium: 9.3 mg/dL (ref 8.4–10.5)
Creat: 1.7 mg/dL — ABNORMAL HIGH (ref 0.50–1.10)
GFR, Est African American: 35 mL/min — ABNORMAL LOW
GFR, Est Non African American: 31 mL/min — ABNORMAL LOW
Total Bilirubin: 0.6 mg/dL (ref 0.3–1.2)
Total Protein: 6.6 g/dL (ref 6.0–8.3)

## 2012-09-11 LAB — THYROID PANEL WITH TSH
T4, Total: 9.4 ug/dL (ref 5.0–12.5)
TSH: 1.577 u[IU]/mL (ref 0.350–4.500)

## 2012-09-11 NOTE — Telephone Encounter (Signed)
K level today was 5.8 today. Patient has appointment at Dr. Chestine Spore tomorrow and she will have him repeat labs.

## 2012-09-11 NOTE — Patient Instructions (Signed)
Diets for Diabetes, Food Labeling Look at food labels to help you decide how much of a product you can eat. You will want to check the amount of total carbohydrate in a serving to see how the food fits into your meal plan. In the list of ingredients, the ingredient present in the largest amount by weight must be listed first, followed by the other ingredients in descending order. STANDARD OF IDENTITY Most products have a list of ingredients. However, foods that the Food and Drug Administration (FDA) has given a standard of identity do not need a list of ingredients. A standard of identity means that a food must contain certain ingredients if it is called a particular name. Examples are mayonnaise, peanut butter, ketchup, jelly, and cheese. LABELING TERMS There are many terms found on food labels. Some of these terms have specific definitions. Some terms are regulated by the FDA, and the FDA has clearly specified how they can be used. Others are not regulated or well-defined and can be misleading and confusing. SPECIFICALLY DEFINED TERMS Nutritive Sweetener.  A sweetener that contains calories,such as table sugar or honey. Nonnutritive Sweetener.  A sweetener with few or no calories,such as saccharin, aspartame, sucralose, and cyclamate. LABELING TERMS REGULATED BY THE FDA Free.  The product contains only a tiny or small amount of fat, cholesterol, sodium, sugar, or calories. For example, a "fat-free" product will contain less than 0.5 g of fat per serving. Low.  A food described as "low" in fat, saturated fat, cholesterol, sodium, or calories could be eaten fairly often without exceeding dietary guidelines. For example, "low in fat" means no more than 3 g of fat per serving. Lean.  "Lean" and "extra lean" are U.S. Department of Agriculture (USDA) terms for use on meat and poultry products. "Lean" means the product contains less than 10 g of fat, 4 g of saturated fat, and 95 mg of cholesterol  per serving. "Lean" is not as low in fat as a product labeled "low." Extra Lean.  "Extra lean" means the product contains less than 5 g of fat, 2 g of saturated fat, and 95 mg of cholesterol per serving. While "extra lean" has less fat than "lean," it is still higher in fat than a product labeled "low." Reduced, Less, Fewer.  A diet product that contains 25% less of a nutrient or calories than the regular version. For example, hot dogs might be labeled "25% less fat than our regular hot dogs." Light/Lite.  A diet product that contains  fewer calories or  the fat of the original. For example, "light in sodium" means a product with  the usual sodium. More.  One serving contains at least 10% more of the daily value of a vitamin, mineral, or fiber than usual. Good Source Of.  One serving contains 10% to 19% of the daily value for a particular vitamin, mineral, or fiber. Excellent Source Of.  One serving contains 20% or more of the daily value for a particular nutrient. Other terms used might be "high in" or "rich in." Enriched or Fortified.  The product contains added vitamins, minerals, or protein. Nutrition labeling must be used on enriched or fortified foods. Imitation.  The product has been altered so that it is lower in protein, vitamins, or minerals than the usual food,such as imitation peanut butter. Total Fat.  The number listed is the total of all fat found in a serving of the product. Under total fat, food labels must list saturated fat and   trans fat, which are associated with raising bad cholesterol and an increased risk of heart blood vessel disease. Saturated Fat.  Mainly fats from animal-based sources. Some examples are red meat, cheese, cream, whole milk, and coconut oil. Trans Fat.  Found in some fried snack foods, packaged foods, and fried restaurant foods. It is recommended you eat as close to 0 g of trans fat as possible, since it raises bad cholesterol and lowers  good cholesterol. Polyunsaturated and Monounsaturated Fats.  More healthful fats. These fats are from plant sources. Total Carbohydrate.  The number of carbohydrate grams in a serving of the product. Under total carbohydrate are listed the other carbohydrate sources, such as dietary fiber and sugars. Dietary Fiber.  A carbohydrate from plant sources. Sugars.  Sugars listed on the label contain all naturally occurring sugars as well as added sugars. LABELING TERMS NOT REGULATED BY THE FDA Sugarless.  Table sugar (sucrose) has not been added. However, the manufacturer may use another form of sugar in place of sucrose to sweeten the product. For example, sugar alcohols are used to sweeten foods. Sugar alcohols are a form of sugar but are not table sugar. If a product contains sugar alcohols in place of sucrose, it can still be labeled "sugarless." Low Salt, Salt-Free, Unsalted, No Salt, No Salt Added, Without Added Salt.  Food that is usually processed with salt has been made without salt. However, the food may contain sodium-containing additives, such as preservatives, leavening agents, or flavorings. Natural.  This term has no legal meaning. Organic.  Foods that are certified as organic have been inspected and approved by the USDA to ensure they are produced without pesticides, fertilizers containing synthetic ingredients, bioengineering, or ionizing radiation. Document Released: 05/13/2003 Document Revised: 08/02/2011 Document Reviewed: 11/28/2008 [employer] Patient Information 2013 [employer].  

## 2012-09-11 NOTE — Progress Notes (Signed)
Subjective:    Patient ID: [name], female    DOB: [birth year], 67 y.o.   MRN: [id number]  HPI- Patient here today for follow up. She has multiple medical problems and sees multiple providers/specialists. Patient says her BS are running high. Averaging in 90's in mornings before eating and drinking. Not watching her diet and little exercise. Has aPPointment for eye exam May 12,2014. Patient says she is doing quite well. Has no complaints today. Allergies  Allergen Reactions  . Aspirin   . Ciprofloxacin   . Codeine   . Micardis (Telmisartan)   . Rofecoxib     Outpatient Encounter Prescriptions as of 09/11/2012  Medication Sig Dispense Refill  . atorvastatin (LIPITOR) 40 MG tablet       . calcitRIOL (ROCALTROL) 0.25 MCG capsule Take 0.25 mcg by mouth daily.       . cloNIDine (CATAPRES) 0.2 MG tablet Take 0.2 mg by mouth 2 (two) times daily.      . furosemide (LASIX) 40 MG tablet Take 40 mg by mouth daily.        Marland Kitchen glimepiride (AMARYL) 4 MG tablet Take 2 mg by mouth. 2 po daily      . levothyroxine (SYNTHROID, LEVOTHROID) 75 MCG tablet Take 75 mcg by mouth daily before breakfast.      . losartan (COZAAR) 100 MG tablet Take 100 mg by mouth daily.        . metoCLOPramide (REGLAN) 5 MG tablet Take 5 mg by mouth 4 (four) times daily.        . metoprolol (TOPROL-XL) 50 MG 24 hr tablet Take 50 mg by mouth daily.        . pantoprazole (PROTONIX) 40 MG tablet Take 40 mg by mouth 2 (two) times daily.      . pioglitazone (ACTOS) 30 MG tablet       . [DISCONTINUED] cloNIDine (CATAPRES) 0.2 MG tablet Take 1 tablet (0.2 mg total) by mouth daily.  60 tablet  11  . [DISCONTINUED] levothyroxine (SYNTHROID, LEVOTHROID) 25 MCG tablet Take 25 mcg by mouth daily.        . [DISCONTINUED] pantoprazole (PROTONIX) 40 MG tablet Take 40 mg by mouth daily.        . [DISCONTINUED] SPS 15 GM/60ML suspension        No facility-administered encounter medications on file as of 09/11/2012.    Past Medical  History  Diagnosis Date  . Hypertension   . Hyperlipidemia   . Thyroid disease     hypothryoidism  . GERD (gastroesophageal reflux disease)   . GAD (generalized anxiety disorder)   . GIB (gastrointestinal bleeding)   . Diabetes mellitus   . CKD (chronic kidney disease), stage III   . Peripheral edema   . Anemia   . Colon polyps   . Coronary artery disease     Past Surgical History  Procedure Laterality Date  . None    . Incision and drainage perirectal abscess    . Retinopathy surgery      History   Social History  . Marital Status: Married    Spouse Name: N/A    Number of Children: 0  . Years of Education: N/A   Occupational History  .     Social History Main Topics  . Smoking status: Never Smoker   . Smokeless tobacco: Not on file  . Alcohol Use: No  . Drug Use: No  . Sexually Active: Not on file   Other Topics Concern  .  Not on file   Social History Narrative  . No narrative on file         Review of Systems  Constitutional: Negative.   HENT: Negative.   Eyes: Negative.   Respiratory: Negative.   Cardiovascular: Negative.   Gastrointestinal: Negative.   Endocrine: Negative.   Genitourinary: Negative.   Musculoskeletal: Negative.   Neurological: Negative.   Psychiatric/Behavioral: Negative.        Objective:   Physical Exam  Constitutional: She is oriented to person, place, and time. She appears well-developed and well-nourished.  HENT:  Nose: Nose normal.  Mouth/Throat: Oropharynx is clear and moist.  Eyes: EOM are normal.  Neck: Trachea normal, normal range of motion and full passive range of motion without pain. Neck supple. No JVD present. Carotid bruit is not present. No thyromegaly present.  Cardiovascular: Normal rate, regular rhythm, normal heart sounds and intact distal pulses.  Exam reveals no gallop and no friction rub.   No murmur heard. Pulmonary/Chest: Effort normal and breath sounds normal.  Abdominal: Soft. Bowel sounds  are normal. She exhibits no distension and no mass. There is no tenderness.  Musculoskeletal: Normal range of motion.  Lymphadenopathy:    She has no cervical adenopathy.  Neurological: She is alert and oriented to person, place, and time. She has normal reflexes.  (+) 3/4 monofilament bil feet  Skin: Skin is warm and dry.  No callus formation bil feet  Psychiatric: She has a normal mood and affect. Her behavior is normal. Judgment and thought content normal.   BP 221/95  Pulse 71  Temp(Src) 97.5 F (36.4 C) (Oral)  Ht 5\' 2"  (1.575 m)  Wt 182 lb (82.555 kg)  BMI 33.28 kg/m2 Results for orders placed in visit on 09/11/12  POCT GLYCOSYLATED HEMOGLOBIN (HGB A1C)      Result Value Range   Hemoglobin A1C 6.4            Assessment & Plan:  1. DM Low carb diet Keep diary of blood sugars - COMPLETE METABOLIC PANEL WITH GFR - NMR Lipoprofile with Lipids - POCT glycosylated hemoglobin (Hb A1C)  2. HYPERLIPIDEMIA Avoid fatty foods - COMPLETE METABOLIC PANEL WITH GFR - NMR Lipoprofile with Lipids  3. HYPERTENSION Decrease NA+ in diet - COMPLETE METABOLIC PANEL WITH GFR - NMR Lipoprofile with Lipids  4. Unspecified hypothyroidism - Thyroid Panel With TSH Keep F/U appointment with specialists  Mary-Margaret Hassell Done, FNP

## 2012-09-12 ENCOUNTER — Other Ambulatory Visit: Payer: Self-pay | Admitting: Nurse Practitioner

## 2012-09-12 ENCOUNTER — Encounter (HOSPITAL_COMMUNITY)
Admission: RE | Admit: 2012-09-12 | Discharge: 2012-09-12 | Disposition: A | Discharge status: Home / Self Care | Payer: Medicare Other | Source: Ambulatory Visit | Attending: Nephrology | Admitting: Nephrology

## 2012-09-12 DIAGNOSIS — N2581 Secondary hyperparathyroidism of renal origin: Secondary | ICD-10-CM | POA: Diagnosis not present

## 2012-09-12 DIAGNOSIS — D638 Anemia in other chronic diseases classified elsewhere: Secondary | ICD-10-CM | POA: Insufficient documentation

## 2012-09-12 DIAGNOSIS — Z139 Encounter for screening, unspecified: Secondary | ICD-10-CM

## 2012-09-12 DIAGNOSIS — N183 Chronic kidney disease, stage 3 unspecified: Secondary | ICD-10-CM | POA: Diagnosis not present

## 2012-09-12 DIAGNOSIS — E875 Hyperkalemia: Secondary | ICD-10-CM | POA: Diagnosis not present

## 2012-09-12 DIAGNOSIS — M8580 Other specified disorders of bone density and structure, unspecified site: Secondary | ICD-10-CM

## 2012-09-12 LAB — NMR LIPOPROFILE WITH LIPIDS
HDL Size: 9.8 nm (ref >=9.2)
LDL Particle Number: 627 nmol/L (ref <1000)
LDL Size: 20.7 nm (ref >20.5)
Large HDL-P: 7.4 umol/L (ref >=4.8)
Large VLDL-P: 1.9 nmol/L (ref <=2.7)
Small LDL Particle Number: 399 nmol/L (ref <=527)
Triglycerides: 79 mg/dL (ref <150)
VLDL Size: 49.6 nm — ABNORMAL HIGH (ref <=46.6)

## 2012-09-12 LAB — POCT HEMOGLOBIN-HEMACUE: Hemoglobin: 12.6 g/dL (ref 12.0–15.0)

## 2012-09-12 MED ORDER — DARBEPOETIN ALFA-POLYSORBATE 100 MCG/0.5ML IJ SOLN: 100.0000 ug | INTRAMUSCULAR | Status: DC

## 2012-09-13 ENCOUNTER — Telehealth: Payer: Self-pay | Admitting: Family Medicine

## 2012-09-13 NOTE — Telephone Encounter (Signed)
Called patient she had her patassim level checked 09/12/12

## 2012-09-22 ENCOUNTER — Other Ambulatory Visit: Payer: Self-pay

## 2012-09-22 MED ORDER — CLONIDINE HCL 0.2 MG PO TABS: 0.2000 mg | ORAL_TABLET | Freq: Two times a day (BID) | ORAL | Status: DC

## 2012-09-22 MED ORDER — PANTOPRAZOLE SODIUM 40 MG PO TBEC: 40.0000 mg | DELAYED_RELEASE_TABLET | Freq: Two times a day (BID) | ORAL | Status: DC

## 2012-09-22 MED ORDER — LEVOTHYROXINE SODIUM 75 MCG PO TABS: 75.0000 ug | ORAL_TABLET | Freq: Every day | ORAL | Status: DC

## 2012-09-22 MED ORDER — GLIMEPIRIDE 4 MG PO TABS: 2.0000 mg | ORAL_TABLET | Freq: Every day | ORAL | Status: DC

## 2012-09-22 MED ORDER — PIOGLITAZONE HCL 30 MG PO TABS: 30.0000 mg | ORAL_TABLET | Freq: Every day | ORAL | Status: DC

## 2012-09-22 MED ORDER — METOCLOPRAMIDE HCL 5 MG PO TABS: 5.0000 mg | ORAL_TABLET | Freq: Four times a day (QID) | ORAL | Status: DC

## 2012-09-22 MED ORDER — ATORVASTATIN CALCIUM 40 MG PO TABS: 40.0000 mg | ORAL_TABLET | Freq: Every day | ORAL | Status: DC

## 2012-09-22 NOTE — Telephone Encounter (Signed)
rx ready for pickup 

## 2012-09-22 NOTE — Telephone Encounter (Signed)
Last seen 11/222/13   Last glucose 06/08/12  Last lipids 11/13  Last thyroid 04/14/12  Print and have nurse call patient to pick up for  Mail order

## 2012-09-23 NOTE — Telephone Encounter (Signed)
rx up front patient aware

## 2012-09-26 ENCOUNTER — Other Ambulatory Visit (HOSPITAL_COMMUNITY): Payer: Self-pay | Admitting: *Deleted

## 2012-09-26 ENCOUNTER — Encounter (HOSPITAL_COMMUNITY): Payer: Medicare Other

## 2012-09-27 ENCOUNTER — Encounter (HOSPITAL_COMMUNITY)
Admission: RE | Admit: 2012-09-27 | Discharge: 2012-09-27 | Disposition: A | Discharge status: Home / Self Care | Payer: Medicare Other | Source: Ambulatory Visit | Attending: Nephrology | Admitting: Nephrology

## 2012-09-27 DIAGNOSIS — D638 Anemia in other chronic diseases classified elsewhere: Secondary | ICD-10-CM | POA: Insufficient documentation

## 2012-09-27 DIAGNOSIS — N183 Chronic kidney disease, stage 3 unspecified: Secondary | ICD-10-CM | POA: Insufficient documentation

## 2012-09-27 LAB — BASIC METABOLIC PANEL
BUN: 47 mg/dL — ABNORMAL HIGH (ref 6–23)
Chloride: 96 mEq/L (ref 96–112)
GFR calc Af Amer: 15 mL/min — ABNORMAL LOW (ref >90)
GFR calc non Af Amer: 13 mL/min — ABNORMAL LOW (ref >90)
Glucose, Bld: 314 mg/dL — ABNORMAL HIGH (ref 70–99)
Potassium: 4.1 mEq/L (ref 3.5–5.1)
Sodium: 135 mEq/L (ref 135–145)

## 2012-09-27 MED ORDER — DARBEPOETIN ALFA-POLYSORBATE 100 MCG/0.5ML IJ SOLN
INTRAMUSCULAR | Status: AC
  Administered 2012-09-27: 100 ug via SUBCUTANEOUS
  Filled 2012-09-27: qty 0.5

## 2012-09-27 MED ORDER — DARBEPOETIN ALFA-POLYSORBATE 100 MCG/0.5ML IJ SOLN: 100.0000 ug | INTRAMUSCULAR | Status: DC

## 2012-10-02 DIAGNOSIS — E1139 Type 2 diabetes mellitus with other diabetic ophthalmic complication: Secondary | ICD-10-CM | POA: Diagnosis not present

## 2012-10-02 DIAGNOSIS — E11359 Type 2 diabetes mellitus with proliferative diabetic retinopathy without macular edema: Secondary | ICD-10-CM | POA: Diagnosis not present

## 2012-10-02 DIAGNOSIS — H353190 Nonexudative age-related macular degeneration, unspecified eye, stage unspecified: Secondary | ICD-10-CM | POA: Diagnosis not present

## 2012-10-04 ENCOUNTER — Telehealth: Payer: Self-pay | Admitting: Nurse Practitioner

## 2012-10-04 NOTE — Telephone Encounter (Signed)
Please advise 

## 2012-10-06 ENCOUNTER — Emergency Department (HOSPITAL_COMMUNITY): Payer: Medicare Other

## 2012-10-06 ENCOUNTER — Inpatient Hospital Stay (HOSPITAL_COMMUNITY)
Admission: EM | Admit: 2012-10-06 | Discharge: 2012-10-12 | DRG: 481 | Disposition: A | Discharge status: Home / Self Care | Payer: Medicare Other | Attending: Internal Medicine | Admitting: Internal Medicine

## 2012-10-06 ENCOUNTER — Encounter (HOSPITAL_COMMUNITY): Payer: Self-pay | Admitting: Emergency Medicine

## 2012-10-06 DIAGNOSIS — S31809A Unspecified open wound of unspecified buttock, initial encounter: Secondary | ICD-10-CM | POA: Diagnosis not present

## 2012-10-06 DIAGNOSIS — I152 Hypertension secondary to endocrine disorders: Secondary | ICD-10-CM | POA: Diagnosis present

## 2012-10-06 DIAGNOSIS — E876 Hypokalemia: Secondary | ICD-10-CM | POA: Diagnosis not present

## 2012-10-06 DIAGNOSIS — W19XXXA Unspecified fall, initial encounter: Secondary | ICD-10-CM | POA: Diagnosis not present

## 2012-10-06 DIAGNOSIS — IMO0001 Reserved for inherently not codable concepts without codable children: Secondary | ICD-10-CM | POA: Diagnosis not present

## 2012-10-06 DIAGNOSIS — E1169 Type 2 diabetes mellitus with other specified complication: Secondary | ICD-10-CM | POA: Diagnosis present

## 2012-10-06 DIAGNOSIS — Z8719 Personal history of other diseases of the digestive system: Secondary | ICD-10-CM

## 2012-10-06 DIAGNOSIS — R899 Unspecified abnormal finding in specimens from other organs, systems and tissues: Secondary | ICD-10-CM

## 2012-10-06 DIAGNOSIS — S7290XD Unspecified fracture of unspecified femur, subsequent encounter for closed fracture with routine healing: Secondary | ICD-10-CM | POA: Diagnosis not present

## 2012-10-06 DIAGNOSIS — K5730 Diverticulosis of large intestine without perforation or abscess without bleeding: Secondary | ICD-10-CM

## 2012-10-06 DIAGNOSIS — M6281 Muscle weakness (generalized): Secondary | ICD-10-CM | POA: Diagnosis not present

## 2012-10-06 DIAGNOSIS — B961 Klebsiella pneumoniae [K. pneumoniae] as the cause of diseases classified elsewhere: Secondary | ICD-10-CM | POA: Diagnosis present

## 2012-10-06 DIAGNOSIS — F411 Generalized anxiety disorder: Secondary | ICD-10-CM | POA: Diagnosis not present

## 2012-10-06 DIAGNOSIS — S72453A Displaced supracondylar fracture without intracondylar extension of lower end of unspecified femur, initial encounter for closed fracture: Secondary | ICD-10-CM | POA: Diagnosis not present

## 2012-10-06 DIAGNOSIS — K922 Gastrointestinal hemorrhage, unspecified: Secondary | ICD-10-CM

## 2012-10-06 DIAGNOSIS — S72409A Unspecified fracture of lower end of unspecified femur, initial encounter for closed fracture: Secondary | ICD-10-CM | POA: Diagnosis not present

## 2012-10-06 DIAGNOSIS — R293 Abnormal posture: Secondary | ICD-10-CM | POA: Diagnosis not present

## 2012-10-06 DIAGNOSIS — N390 Urinary tract infection, site not specified: Secondary | ICD-10-CM | POA: Diagnosis present

## 2012-10-06 DIAGNOSIS — W010XXA Fall on same level from slipping, tripping and stumbling without subsequent striking against object, initial encounter: Secondary | ICD-10-CM | POA: Diagnosis present

## 2012-10-06 DIAGNOSIS — E1139 Type 2 diabetes mellitus with other diabetic ophthalmic complication: Secondary | ICD-10-CM | POA: Diagnosis not present

## 2012-10-06 DIAGNOSIS — M79609 Pain in unspecified limb: Secondary | ICD-10-CM | POA: Diagnosis not present

## 2012-10-06 DIAGNOSIS — S7290XA Unspecified fracture of unspecified femur, initial encounter for closed fracture: Secondary | ICD-10-CM | POA: Diagnosis not present

## 2012-10-06 DIAGNOSIS — W1789XA Other fall from one level to another, initial encounter: Secondary | ICD-10-CM | POA: Diagnosis present

## 2012-10-06 DIAGNOSIS — IMO0002 Reserved for concepts with insufficient information to code with codable children: Secondary | ICD-10-CM | POA: Diagnosis not present

## 2012-10-06 DIAGNOSIS — I10 Essential (primary) hypertension: Secondary | ICD-10-CM | POA: Diagnosis not present

## 2012-10-06 DIAGNOSIS — R609 Edema, unspecified: Secondary | ICD-10-CM

## 2012-10-06 DIAGNOSIS — D62 Acute posthemorrhagic anemia: Secondary | ICD-10-CM | POA: Diagnosis not present

## 2012-10-06 DIAGNOSIS — E669 Obesity, unspecified: Secondary | ICD-10-CM | POA: Diagnosis present

## 2012-10-06 DIAGNOSIS — S7292XA Unspecified fracture of left femur, initial encounter for closed fracture: Secondary | ICD-10-CM

## 2012-10-06 DIAGNOSIS — R079 Chest pain, unspecified: Secondary | ICD-10-CM | POA: Diagnosis not present

## 2012-10-06 DIAGNOSIS — F329 Major depressive disorder, single episode, unspecified: Secondary | ICD-10-CM

## 2012-10-06 DIAGNOSIS — S72402A Unspecified fracture of lower end of left femur, initial encounter for closed fracture: Secondary | ICD-10-CM

## 2012-10-06 DIAGNOSIS — E785 Hyperlipidemia, unspecified: Secondary | ICD-10-CM | POA: Diagnosis present

## 2012-10-06 DIAGNOSIS — I129 Hypertensive chronic kidney disease with stage 1 through stage 4 chronic kidney disease, or unspecified chronic kidney disease: Secondary | ICD-10-CM | POA: Diagnosis present

## 2012-10-06 DIAGNOSIS — N183 Chronic kidney disease, stage 3 unspecified: Secondary | ICD-10-CM | POA: Diagnosis not present

## 2012-10-06 DIAGNOSIS — I452 Bifascicular block: Secondary | ICD-10-CM | POA: Diagnosis not present

## 2012-10-06 DIAGNOSIS — K219 Gastro-esophageal reflux disease without esophagitis: Secondary | ICD-10-CM | POA: Diagnosis not present

## 2012-10-06 DIAGNOSIS — E119 Type 2 diabetes mellitus without complications: Secondary | ICD-10-CM

## 2012-10-06 DIAGNOSIS — R739 Hyperglycemia, unspecified: Secondary | ICD-10-CM

## 2012-10-06 DIAGNOSIS — R6889 Other general symptoms and signs: Secondary | ICD-10-CM | POA: Diagnosis not present

## 2012-10-06 DIAGNOSIS — Z9181 History of falling: Secondary | ICD-10-CM | POA: Diagnosis not present

## 2012-10-06 DIAGNOSIS — E039 Hypothyroidism, unspecified: Secondary | ICD-10-CM | POA: Diagnosis not present

## 2012-10-06 DIAGNOSIS — Z5189 Encounter for other specified aftercare: Secondary | ICD-10-CM | POA: Diagnosis not present

## 2012-10-06 DIAGNOSIS — W19XXXD Unspecified fall, subsequent encounter: Secondary | ICD-10-CM

## 2012-10-06 DIAGNOSIS — D126 Benign neoplasm of colon, unspecified: Secondary | ICD-10-CM

## 2012-10-06 DIAGNOSIS — Y92009 Unspecified place in unspecified non-institutional (private) residence as the place of occurrence of the external cause: Secondary | ICD-10-CM

## 2012-10-06 DIAGNOSIS — R269 Unspecified abnormalities of gait and mobility: Secondary | ICD-10-CM | POA: Diagnosis not present

## 2012-10-06 DIAGNOSIS — N259 Disorder resulting from impaired renal tubular function, unspecified: Secondary | ICD-10-CM | POA: Diagnosis present

## 2012-10-06 DIAGNOSIS — R7309 Other abnormal glucose: Secondary | ICD-10-CM | POA: Diagnosis not present

## 2012-10-06 DIAGNOSIS — M25569 Pain in unspecified knee: Secondary | ICD-10-CM | POA: Diagnosis not present

## 2012-10-06 DIAGNOSIS — S72499A Other fracture of lower end of unspecified femur, initial encounter for closed fracture: Secondary | ICD-10-CM | POA: Diagnosis not present

## 2012-10-06 DIAGNOSIS — R279 Unspecified lack of coordination: Secondary | ICD-10-CM | POA: Diagnosis not present

## 2012-10-06 DIAGNOSIS — K5900 Constipation, unspecified: Secondary | ICD-10-CM | POA: Diagnosis not present

## 2012-10-06 HISTORY — DX: Anxiety disorder, unspecified: F41.9

## 2012-10-06 HISTORY — DX: Unspecified osteoarthritis, unspecified site: M19.90

## 2012-10-06 HISTORY — DX: Hypothyroidism, unspecified: E03.9

## 2012-10-06 HISTORY — DX: Type 2 diabetes mellitus without complications: E11.9

## 2012-10-06 LAB — CBC
Hemoglobin: 11.5 g/dL — ABNORMAL LOW (ref 12.0–15.0)
MCH: 28.7 pg (ref 26.0–34.0)
Platelets: 248 10*3/uL (ref 150–400)
RBC: 4.01 MIL/uL (ref 3.87–5.11)
WBC: 9.2 10*3/uL (ref 4.0–10.5)

## 2012-10-06 LAB — URINALYSIS, ROUTINE W REFLEX MICROSCOPIC
Bilirubin Urine: NEGATIVE
Hgb urine dipstick: NEGATIVE
Ketones, ur: NEGATIVE mg/dL
Nitrite: NEGATIVE
Protein, ur: NEGATIVE mg/dL
Specific Gravity, Urine: 1.012 (ref 1.005–1.030)
Urobilinogen, UA: 0.2 mg/dL (ref 0.0–1.0)

## 2012-10-06 LAB — URINE MICROSCOPIC-ADD ON

## 2012-10-06 LAB — POCT I-STAT 3, VENOUS BLOOD GAS (G3P V)
Patient temperature: 98
pH, Ven: 7.379 — ABNORMAL HIGH (ref 7.250–7.300)
pO2, Ven: 34 mmHg (ref 30.0–45.0)

## 2012-10-06 LAB — COMPREHENSIVE METABOLIC PANEL
ALT: 7 U/L (ref 0–35)
AST: 21 U/L (ref 0–37)
Alkaline Phosphatase: 178 U/L — ABNORMAL HIGH (ref 39–117)
CO2: 25 mEq/L (ref 19–32)
Chloride: 99 mEq/L (ref 96–112)
GFR calc Af Amer: 28 mL/min — ABNORMAL LOW (ref >90)
GFR calc non Af Amer: 24 mL/min — ABNORMAL LOW (ref >90)
Glucose, Bld: 541 mg/dL — ABNORMAL HIGH (ref 70–99)
Sodium: 139 mEq/L (ref 135–145)
Total Bilirubin: 0.4 mg/dL (ref 0.3–1.2)

## 2012-10-06 LAB — GLUCOSE, CAPILLARY: Glucose-Capillary: 518 mg/dL — ABNORMAL HIGH (ref 70–99)

## 2012-10-06 LAB — PROTIME-INR: INR: 1.1 (ref 0.00–1.49)

## 2012-10-06 MED ORDER — TETANUS-DIPHTH-ACELL PERTUSSIS 5-2.5-18.5 LF-MCG/0.5 IM SUSP
0.5000 mL | Freq: Once | INTRAMUSCULAR | Status: AC
  Administered 2012-10-06: 0.5 mL via INTRAMUSCULAR
  Filled 2012-10-06: qty 0.5

## 2012-10-06 MED ORDER — INSULIN ASPART 100 UNIT/ML ~~LOC~~ SOLN
8.0000 [IU] | Freq: Once | SUBCUTANEOUS | Status: AC
  Administered 2012-10-06: 8 [IU] via SUBCUTANEOUS
  Filled 2012-10-06: qty 1

## 2012-10-06 MED ORDER — ONDANSETRON HCL 4 MG/2ML IJ SOLN
4.0000 mg | Freq: Three times a day (TID) | INTRAMUSCULAR | Status: AC | PRN
  Administered 2012-10-07: 4 mg via INTRAVENOUS
  Filled 2012-10-06: qty 2

## 2012-10-06 MED ORDER — SODIUM CHLORIDE 0.9 % IV SOLN
INTRAVENOUS | Status: AC
  Administered 2012-10-06: via INTRAVENOUS

## 2012-10-06 MED ORDER — SODIUM CHLORIDE 0.9 % IV BOLUS (SEPSIS)
1000.0000 mL | Freq: Once | INTRAVENOUS | Status: AC
  Administered 2012-10-06: 1000 mL via INTRAVENOUS

## 2012-10-06 MED ORDER — HYDROMORPHONE HCL PF 1 MG/ML IJ SOLN: 1.0000 mg | INTRAMUSCULAR | Status: AC | PRN

## 2012-10-06 MED ORDER — HYDRALAZINE HCL 20 MG/ML IJ SOLN
10.0000 mg | Freq: Once | INTRAMUSCULAR | Status: AC
  Administered 2012-10-06: 10 mg via INTRAVENOUS
  Filled 2012-10-06: qty 1

## 2012-10-06 NOTE — Telephone Encounter (Signed)
Called [facility] and clarified. Pt aware

## 2012-10-06 NOTE — ED Notes (Signed)
Positive comminuted distal femur fx noted, acuity changed.

## 2012-10-06 NOTE — ED Provider Notes (Signed)
History     CSN: [id number]  Arrival date & time 10/06/12  [id number]   First MD Initiated Contact with Patient 10/06/12 1958      Chief Complaint  Patient presents with  . Fall  . Knee Pain    (Consider location/radiation/quality/duration/timing/severity/associated sxs/prior treatment) HPI Comments: Patient presents after mechanical fall on the stairs. She tripped on her slipper and landed on her left knee. She denies hitting her head or losing consciousness. She denies any dizziness, lightheadedness, chest pain or shortness of breath. She is unable to bear weight. She was found to be hyperglycemic greater than 400. She states she just ate dinner before this happened. Denies any head, neck, chest, abdominal pain or back pain.  The history is provided by the patient and the EMS personnel.    Past Medical History  Diagnosis Date  . Hypertension   . Hyperlipidemia   . Thyroid disease     hypothryoidism  . GERD (gastroesophageal reflux disease)   . GAD (generalized anxiety disorder)   . GIB (gastrointestinal bleeding)   . Diabetes mellitus   . CKD (chronic kidney disease), stage III   . Peripheral edema   . Anemia   . Colon polyps   . Coronary artery disease     Past Surgical History  Procedure Laterality Date  . None    . Incision and drainage perirectal abscess    . Retinopathy surgery      Family History  Problem Relation Age of Onset  . Cancer Father   . Colon cancer Father   . Prostate cancer Father   . Diabetes Father   . Coronary artery disease Mother 20  . Heart disease Mother   . Diabetes Mother   . Coronary artery disease Sister 69  . Diabetes Sister   . Diabetes Maternal Grandmother   . Diabetes Sister   . Diabetes Sister   . Diabetes Sister   . Diabetes Sister   . Diabetes Sister     History  Substance Use Topics  . Smoking status: Never Smoker   . Smokeless tobacco: Not on file  . Alcohol Use: No    OB History   Grav Para Term Preterm  Abortions TAB SAB Ect Mult Living                  Review of Systems  Constitutional: Negative for activity change and appetite change.  HENT: Negative for congestion and rhinorrhea.   Respiratory: Negative for cough, chest tightness and shortness of breath.   Cardiovascular: Negative for chest pain.  Gastrointestinal: Negative for nausea, vomiting and abdominal pain.  Genitourinary: Negative for dysuria, hematuria, vaginal bleeding and vaginal discharge.  Musculoskeletal: Positive for myalgias, joint swelling and arthralgias.  Skin: Negative for rash.  Neurological: Negative for dizziness, weakness and headaches.  A complete 10 system review of systems was obtained and all systems are negative except as noted in the HPI and PMH.    Allergies  Aspirin; Ciprofloxacin; Codeine; Micardis; and Rofecoxib  Home Medications   Current Outpatient Rx  Name  Route  Sig  Dispense  Refill  . atorvastatin (LIPITOR) 40 MG tablet   Oral   Take 1 tablet (40 mg total) by mouth daily.   90 tablet   1   . calcitRIOL (ROCALTROL) 0.25 MCG capsule   Oral   Take 0.25 mcg by mouth daily.          . cloNIDine (CATAPRES) 0.2 MG tablet   Oral  Take 1 tablet (0.2 mg total) by mouth 2 (two) times daily.   90 tablet   1   . furosemide (LASIX) 40 MG tablet   Oral   Take 40 mg by mouth daily.          Marland Kitchen glimepiride (AMARYL) 2 MG tablet   Oral   Take 2 mg by mouth daily before breakfast.         . levothyroxine (SYNTHROID, LEVOTHROID) 75 MCG tablet   Oral   Take 1 tablet (75 mcg total) by mouth daily before breakfast.   90 tablet   1   . losartan (COZAAR) 100 MG tablet   Oral   Take 100 mg by mouth daily.           . metoCLOPramide (REGLAN) 5 MG tablet   Oral   Take 1 tablet (5 mg total) by mouth 4 (four) times daily.   360 tablet   1   . metoprolol (TOPROL-XL) 50 MG 24 hr tablet   Oral   Take 50 mg by mouth daily.           . pantoprazole (PROTONIX) 40 MG tablet    Oral   Take 1 tablet (40 mg total) by mouth 2 (two) times daily.   180 tablet   1   . pioglitazone (ACTOS) 30 MG tablet   Oral   Take 1 tablet (30 mg total) by mouth daily.   90 tablet   1     BP 183/65  Pulse 72  Temp(Src) 98.2 F (36.8 C) (Oral)  Resp 18  SpO2 100%  Physical Exam  Constitutional: She is oriented to person, place, and time. She appears well-developed and well-nourished. No distress.  HENT:  Head: Normocephalic and atraumatic.  Mouth/Throat: Oropharynx is clear and moist. No oropharyngeal exudate.  Eyes: Conjunctivae and EOM are normal. Pupils are equal, round, and reactive to light.  Neck: Normal range of motion. Neck supple.  Cardiovascular: Normal rate, regular rhythm and normal heart sounds.   No murmur heard. Pulmonary/Chest: Effort normal and breath sounds normal. No respiratory distress.  Abdominal: Soft. There is no tenderness. There is no rebound and no guarding.  Musculoskeletal: Normal range of motion. She exhibits edema and tenderness.  Effusion and abrasion to left patella. Flexion and extension intact. +2 DPPT pulses. Compartments soft. Achilles tendon intact. Crepitance along distal femur.  Neurological: She is alert and oriented to person, place, and time. No cranial nerve deficit. She exhibits normal muscle tone. Coordination normal.  Skin: Skin is warm.    ED Course  Procedures (including critical care time)  Labs Reviewed  CBC - Abnormal; Notable for the following:    Hemoglobin 11.5 (*)    HCT 34.6 (*)    All other components within normal limits  COMPREHENSIVE METABOLIC PANEL - Abnormal; Notable for the following:    Glucose, Bld 541 (*)    BUN 30 (*)    Creatinine, Ser 2.03 (*)    Calcium 8.0 (*)    Albumin 3.4 (*)    Alkaline Phosphatase 178 (*)    GFR calc non Af Amer 24 (*)    GFR calc Af Amer 28 (*)    All other components within normal limits  URINALYSIS, ROUTINE W REFLEX MICROSCOPIC - Abnormal; Notable for the  following:    Glucose, UA >1000 (*)    Leukocytes, UA SMALL (*)    All other components within normal limits  GLUCOSE, CAPILLARY - Abnormal; Notable for  the following:    Glucose-Capillary 495 (*)    All other components within normal limits  URINE MICROSCOPIC-ADD ON - Abnormal; Notable for the following:    Bacteria, UA MANY (*)    All other components within normal limits  GLUCOSE, CAPILLARY - Abnormal; Notable for the following:    Glucose-Capillary 518 (*)    All other components within normal limits  POCT I-STAT 3, BLOOD GAS (G3P V) - Abnormal; Notable for the following:    pH, Ven 7.379 (*)    pCO2, Ven 44.1 (*)    Bicarbonate 26.1 (*)    All other components within normal limits  URINE CULTURE  KETONES, QUALITATIVE  PROTIME-INR   Dg Chest 2 View  10/06/2012   *RADIOLOGY REPORT*  Clinical Data: Status post fall.  Knee pain  CHEST - 2 VIEW  Comparison: 01/08/2011  Findings: Heart size is normal.  There is no pleural effusion or edema.  Asymmetric elevation of the right hemidiaphragm noted.  There is no airspace consolidation identified.  Review of the visualized osseous structures is unremarkable.  IMPRESSION:  1.  No acute cardiopulmonary abnormalities. 2.  Asymmetric elevation of the right hemidiaphragm.   Original Report Authenticated By: Kerby Moors, M.D.   Dg Knee Complete 4 Views Left  10/06/2012   *RADIOLOGY REPORT*  Clinical Data: Fall with pain and swelling.  LEFT KNEE - COMPLETE 4+ VIEW  Comparison: None.  Findings: Vascular calcifications.  Three compartment underlying osteoarthritis.  Comminuted, intra-articular fracture of the distal femur.  Fracture fragment extends out the medial aspect of the supracondylar region and extends distally into the intercondylar region of the femur.  A bone fragment projecting proximal to the patella on the lateral view may have arisen from the distal femur. No definite source within the patella.  Moderate suprapatellar joint effusion.   IMPRESSION: Intra-articular fracture of the distal femur with probable comminution and concurrent suprapatellar joint effusion.  Consider CT.  Underlying osteopenia.  Advanced vascular calcifications.  Likely related to diabetes.   Original Report Authenticated By: Abigail Miyamoto, M.D.     1. Femur fracture, left, closed, initial encounter   2. Hyperglycemia       MDM  Mechanical fall with left knee pain. Did not hit head or neck. Patient with abrasion and swelling to left knee. Crepitance on movement. Neurovascular intact distally. Denies head, neck, back, abdominal pain or chest pain.  X-ray remarkable for distal femur fracture with comminution. Patient hyperglycemic without DKA. She is given IV fluids and insulin.  Orthopedic consult by Dr. Marcelino Scot. Will admit for medical management.      Date: 10/06/2012  Rate: 73  Rhythm: normal sinus rhythm  QRS Axis: left  Intervals: normal  ST/T Wave abnormalities: nonspecific ST/T changes  Conduction Disutrbances:right bundle branch block and left anterior fascicular block  Narrative Interpretation:   Old EKG Reviewed: none available    Ezequiel Essex, MD 10/06/12 2325

## 2012-10-06 NOTE — ED Notes (Signed)
Per EMS; pt from home sts fell up stairs and c/o left knee pain; abrasion noted with some swelling; pt noted to be hyperglycemic; pt given 1000CC in route NS; pt denies LOC

## 2012-10-06 NOTE — Consult Note (Signed)
Orthopaedic Trauma Service consult  Reason for Consult: Left distal femur fracture  Referring Physician: Vevelyn Francois, MD (EDP)    HPI:   Pt is a 67 y/o white female with complicated medical hx that sustained a fall while at home this evening. Patient was outside sweeping her porch when she tripped over her feet going up stairs. She landed directly on her left knee. Patient had immediate onset of pain and inability to bear weight. She was brought to [facility] for evaluation where she was found to have a left distal femur fracture. Orthopedics was consult for definitive management. Patient seen in ED room A1. She has been minimal complaints. States that her left leg pain is tolerable. denies any numbness or tingling in her left lower extremity. denies any injuries elsewhere. Did not strike her head or lose consciousness as a result of fall. Patient reports pre-existing left knee arthritis.  Patient denies any chest pain or shortness of breath. No palpitations. No wheezing noted trouble breathing. No bone pain. No nausea or vomiting. No headaches or lightheadedness.  Pt does not use ambulatory devices at home She lives with her husband. Limited in her daily activities due to her chronic illnesses   Past Medical History  Diagnosis Date  . Hypertension   . Hyperlipidemia   . Thyroid disease     hypothryoidism  . GERD (gastroesophageal reflux disease)   . GAD (generalized anxiety disorder)   . GIB (gastrointestinal bleeding)   . Diabetes mellitus   . CKD (chronic kidney disease), stage III   . Peripheral edema   . Anemia   . Colon polyps   . Coronary artery disease     Past Surgical History  Procedure Laterality Date  . None    . Incision and drainage perirectal abscess    . Retinopathy surgery      Family History  Problem Relation Age of Onset  . Cancer Father   . Colon cancer Father   . Prostate cancer Father   . Diabetes Father   . Coronary artery disease Mother 34  .  Heart disease Mother   . Diabetes Mother   . Coronary artery disease Sister 84  . Diabetes Sister   . Diabetes Maternal Grandmother   . Diabetes Sister   . Diabetes Sister   . Diabetes Sister   . Diabetes Sister   . Diabetes Sister     Social History:  reports that she has never smoked. She does not have any smokeless tobacco history on file. She reports that she does not drink alcohol or use illicit drugs.  Allergies:  Allergies  Allergen Reactions  . Aspirin     vomiting  . Ciprofloxacin     unknown  . Codeine     Makes me sick   . Micardis (Telmisartan)     unknown  . Rofecoxib     unknown   Home Medications      Current Outpatient Rx   Name    Route    Sig    Dispense    Refill   .  atorvastatin (LIPITOR) 40 MG tablet      Oral      Take 1 tablet (40 mg total) by mouth daily.      90 tablet      1     .  calcitRIOL (ROCALTROL) 0.25 MCG capsule      Oral      Take 0.25 mcg by mouth daily.                 Marland Kitchen  cloNIDine (CATAPRES) 0.2 MG tablet      Oral      Take 1 tablet (0.2 mg total) by mouth 2 (two) times daily.      90 tablet      1     .  furosemide (LASIX) 40 MG tablet      Oral      Take 40 mg by mouth daily.                 Marland Kitchen  glimepiride (AMARYL) 2 MG tablet      Oral      Take 2 mg by mouth daily before breakfast.               .  levothyroxine (SYNTHROID, LEVOTHROID) 75 MCG tablet      Oral      Take 1 tablet (75 mcg total) by mouth daily before breakfast.      90 tablet      1     .  losartan (COZAAR) 100 MG tablet      Oral      Take 100 mg by mouth daily.                  .  metoCLOPramide (REGLAN) 5 MG tablet      Oral      Take 1 tablet (5 mg total) by mouth 4 (four) times daily.      360 tablet      1     .  metoprolol (TOPROL-XL) 50 MG 24 hr tablet      Oral      Take 50 mg by mouth daily.                  .  pantoprazole (PROTONIX) 40 MG tablet      Oral      Take 1 tablet (40 mg total) by mouth 2 (two) times  daily.      180 tablet      1     .  pioglitazone (ACTOS) 30 MG tablet      Oral      Take 1 tablet (30 mg total) by mouth daily.      90 tablet          Results for orders placed during the hospital encounter of 10/06/12 (from the past 48 hour(s))  CBC     Status: Abnormal   Collection Time    10/06/12  6:20 PM      Result Value Range   WBC 9.2  4.0 - 10.5 K/uL   RBC 4.01  3.87 - 5.11 MIL/uL   Hemoglobin 11.5 (*) 12.0 - 15.0 g/dL   HCT 34.6 (*) 36.0 - 46.0 %   MCV 86.3  78.0 - 100.0 fL   MCH 28.7  26.0 - 34.0 pg   MCHC 33.2  30.0 - 36.0 g/dL   RDW 14.2  11.5 - 15.5 %   Platelets 248  150 - 400 K/uL  COMPREHENSIVE METABOLIC PANEL     Status: Abnormal   Collection Time    10/06/12  6:20 PM      Result Value Range   Sodium 139  135 - 145 mEq/L   Potassium 3.7  3.5 - 5.1 mEq/L   Chloride 99  96 - 112 mEq/L   CO2 25  19 - 32 mEq/L   Glucose, Bld 541 (*) 70 - 99 mg/dL   BUN 30 (*) 6 - 23  mg/dL   Creatinine, Ser 2.03 (*) 0.50 - 1.10 mg/dL   Calcium 8.0 (*) 8.4 - 10.5 mg/dL   Total Protein 6.9  6.0 - 8.3 g/dL   Albumin 3.4 (*) 3.5 - 5.2 g/dL   AST 21  0 - 37 U/L   ALT 7  0 - 35 U/L   Alkaline Phosphatase 178 (*) 39 - 117 U/L   Total Bilirubin 0.4  0.3 - 1.2 mg/dL   GFR calc non Af Amer 24 (*) >90 mL/min   GFR calc Af Amer 28 (*) >90 mL/min   Comment:            The eGFR has been calculated     using the CKD EPI equation.     This calculation has not been     validated in all clinical     situations.     eGFR's persistently     <90 mL/min signify     possible Chronic Kidney Disease.  GLUCOSE, CAPILLARY     Status: Abnormal   Collection Time    10/06/12  6:22 PM      Result Value Range   Glucose-Capillary 495 (*) 70 - 99 mg/dL   Comment 1 Documented in Chart     Comment 2 Notify RN    URINALYSIS, ROUTINE W REFLEX MICROSCOPIC     Status: Abnormal   Collection Time    10/06/12  6:43 PM      Result Value Range   Color, Urine YELLOW  YELLOW   APPearance CLEAR   CLEAR   Specific Gravity, Urine 1.012  1.005 - 1.030   pH 5.5  5.0 - 8.0   Glucose, UA >1000 (*) NEGATIVE mg/dL   Hgb urine dipstick NEGATIVE  NEGATIVE   Bilirubin Urine NEGATIVE  NEGATIVE   Ketones, ur NEGATIVE  NEGATIVE mg/dL   Protein, ur NEGATIVE  NEGATIVE mg/dL   Urobilinogen, UA 0.2  0.0 - 1.0 mg/dL   Nitrite NEGATIVE  NEGATIVE   Leukocytes, UA SMALL (*) NEGATIVE  URINE MICROSCOPIC-ADD ON     Status: Abnormal   Collection Time    10/06/12  6:43 PM      Result Value Range   Squamous Epithelial / LPF RARE  RARE   WBC, UA 3-6  <3 WBC/hpf   Bacteria, UA MANY (*) RARE  GLUCOSE, CAPILLARY     Status: Abnormal   Collection Time    10/06/12  8:19 PM      Result Value Range   Glucose-Capillary 518 (*) 70 - 99 mg/dL  KETONES, QUALITATIVE     Status: None   Collection Time    10/06/12  8:22 PM      Result Value Range   Acetone, Bld NEGATIVE  NEGATIVE  POCT I-STAT 3, BLOOD GAS (G3P V)     Status: Abnormal   Collection Time    10/06/12  8:49 PM      Result Value Range   pH, Ven 7.379 (*) 7.250 - 7.300   pCO2, Ven 44.1 (*) 45.0 - 50.0 mmHg   pO2, Ven 34.0  30.0 - 45.0 mmHg   Bicarbonate 26.1 (*) 20.0 - 24.0 mEq/L   TCO2 27  0 - 100 mmol/L   O2 Saturation 66.0     Patient temperature 98.0 F     Collection site BRACHIAL ARTERY     Sample type VENOUS     Comment NOTIFIED PHYSICIAN    PROTIME-INR     Status: None  Collection Time    10/06/12  9:00 PM      Result Value Range   Prothrombin Time 14.1  11.6 - 15.2 seconds   INR 1.10  0.00 - 1.49    Dg Chest 2 View  10/06/2012   *RADIOLOGY REPORT*  Clinical Data: Status post fall.  Knee pain  CHEST - 2 VIEW  Comparison: 01/08/2011  Findings: Heart size is normal.  There is no pleural effusion or edema.  Asymmetric elevation of the right hemidiaphragm noted.  There is no airspace consolidation identified.  Review of the visualized osseous structures is unremarkable.  IMPRESSION:  1.  No acute cardiopulmonary abnormalities. 2.   Asymmetric elevation of the right hemidiaphragm.   Original Report Authenticated By: Kerby Moors, M.D.   Ct Knee Left Wo Contrast  10/06/2012   *RADIOLOGY REPORT*  Clinical Data: Femur fracture.  Fall.  CT OF THE LEFT KNEE WITHOUT CONTRAST  Technique:  Multidetector CT imaging was performed according to the standard protocol. Multiplanar CT image reconstructions were also generated.  Comparison: 10/06/2012  Findings: Comminuted distal femoral fracture extends from the distal metadiaphysis to the intercondylar notch and articular surfaces of the lateral femoral condyle.  Several prior fracture planes extend into the lateral femoral trochlear groove and central intercondylar notch  No significantly depressed cortical fragments extend into the comminuted fracture planes.  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  Chronically fragmented osteophytes along the superior patellar margin.  Hemarthrosis noted.  Suspected lipohemarthrosis and in the Baker's cyst.  Amorphous calcification along the expected tract of the popliteus tendon.  Irregular amorphous calcification along the distal iliotibial band.  IMPRESSION:  1.  Prominently comminuted distal femoral fracture extending from the metaphysis to the intercondylar notch and lateral condylar articular surface. No cortical fragments embedded within the fracture planes.  2.  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   Original Report Authenticated By: Van Clines, M.D.   Dg Knee Complete 4 Views Left  10/06/2012   *RADIOLOGY REPORT*  Clinical Data: Fall with pain and swelling.  LEFT KNEE - COMPLETE 4+ VIEW  Comparison: None.  Findings: Vascular calcifications.  Three compartment underlying osteoarthritis.  Comminuted, intra-articular fracture of the distal femur.  Fracture fragment extends out the medial aspect of the supracondylar region and extends distally into the intercondylar region of the femur.  A bone fragment projecting  proximal to the patella on the lateral view may have arisen from the distal femur. No definite source within the patella.  Moderate suprapatellar joint effusion.  IMPRESSION: Intra-articular fracture of the distal femur with probable comminution and concurrent suprapatellar joint effusion.  Consider CT.  Underlying osteopenia.  Advanced vascular calcifications.  Likely related to diabetes.   Original Report Authenticated By: Abigail Miyamoto, M.D.    Review of Systems  Constitutional: Negative for fever and chills.  Eyes: Negative for blurred vision.  Respiratory: Negative for shortness of breath and wheezing.   Cardiovascular: Negative for chest pain and palpitations.  Gastrointestinal: Negative for nausea, vomiting and abdominal pain.  Musculoskeletal:       L knee pain   Neurological: Negative for tingling, sensory change and headaches.   Blood pressure 198/68, pulse 80, temperature 98.6 F (37 C), temperature source Oral, resp. rate 16, SpO2 99.00%. Physical Exam  Constitutional: Vital signs are normal. She is cooperative. No distress.  HENT:  Head: Normocephalic and atraumatic.  Mouth/Throat: Mucous membranes are dry.  Neck: Normal range of motion and full passive range of motion without pain. No spinous process  tenderness and no muscular tenderness present.  Cardiovascular: Normal rate, regular rhythm, S1 normal and S2 normal.   Respiratory:  Clear, unlabored   GI:  Obese, soft, NT, + BS  Musculoskeletal:  Bilateral UEx   No acute findings   Unremarkable   Motor and sensory functions intact   + pulses   Right lower extremity   No acute findings   Motor and sensory functions grossly intact     Left lower extremity   Hip w/o acute findings, no hip pain with log rolling or axial loading   Knee immobilizer in place   Knee immobilizer adjusted, towels placed under knee    + swelling L leg   Compartments soft and NT    No open wounds or lesions   Distal femur TTP    Tibia,  ankle and foot nontender, no instability    DPN, SPN, TN sensation intact   EHL, FHL, AT, PT, peroneals, gastroc-soleus complex motor intact   2+ DP pulse    PT 1+            Neurological: She is alert.  Psychiatric: She has a normal mood and affect. Her speech is normal and behavior is normal.    Assessment/Plan:  67 y/o white female s/p mechanical fall  1. Mechanical fall 2. L distal femur fracture with intra-articular extension and shortening  Pt will need surgical intervention to restore stability and length  She will likely need plate osteosynthesis but ? Distal femoral replacement give h/o arthritic symptoms   Hopeful to proceed with surgery in next 1-2 days  NWB L leg  Knee immobilizer for comfort  ACE to L leg for swelling control  Ice  Elevate  Suspect pt will need placement at d/c despite her desires to go home   3. Medical issues  Medical admission    4 Pain management:  Low dose narcotics  Tylenol  5.DVT/PE prophylaxis:  Foot pumps for now  6. CKD  Renal MD- [place]   7. Metabolic Bone Disease:  Pt takes vitamin d  Clinical pt seems to have osteoporosis given low energy mechanism  Check vitamin D levels  8. Activity:  OOB with assist  NWB Left leg  9. FEN/Foley/Lines:   gentle fluids  CHO modified diet  10. Impediments to fracture healing:  Low bone density  DM  Renal disease  Limited mobility  11. Dispo:  Admit to medicine   OR once risk stratified  Likely to need SNF at d/c   Jari Pigg, PA-C [facility] [phone] (P) 10/06/2012 11:48 PM    Ikey Omary W 10/06/2012, 11:48 PM

## 2012-10-07 DIAGNOSIS — S72499A Other fracture of lower end of unspecified femur, initial encounter for closed fracture: Secondary | ICD-10-CM | POA: Diagnosis not present

## 2012-10-07 DIAGNOSIS — E119 Type 2 diabetes mellitus without complications: Secondary | ICD-10-CM | POA: Diagnosis not present

## 2012-10-07 DIAGNOSIS — S7290XA Unspecified fracture of unspecified femur, initial encounter for closed fracture: Secondary | ICD-10-CM | POA: Diagnosis not present

## 2012-10-07 DIAGNOSIS — R7309 Other abnormal glucose: Secondary | ICD-10-CM | POA: Diagnosis not present

## 2012-10-07 DIAGNOSIS — R739 Hyperglycemia, unspecified: Secondary | ICD-10-CM | POA: Diagnosis present

## 2012-10-07 DIAGNOSIS — I10 Essential (primary) hypertension: Secondary | ICD-10-CM

## 2012-10-07 DIAGNOSIS — E785 Hyperlipidemia, unspecified: Secondary | ICD-10-CM

## 2012-10-07 LAB — BASIC METABOLIC PANEL
BUN: 27 mg/dL — ABNORMAL HIGH (ref 6–23)
Calcium: 7.9 mg/dL — ABNORMAL LOW (ref 8.4–10.5)
Chloride: 101 mEq/L (ref 96–112)
Creatinine, Ser: 1.72 mg/dL — ABNORMAL HIGH (ref 0.50–1.10)
GFR calc Af Amer: 34 mL/min — ABNORMAL LOW (ref >90)

## 2012-10-07 LAB — CBC
Hemoglobin: 9.8 g/dL — ABNORMAL LOW (ref 12.0–15.0)
MCH: 28.1 pg (ref 26.0–34.0)
MCHC: 33.2 g/dL (ref 30.0–36.0)
Platelets: 222 10*3/uL (ref 150–400)
RDW: 14.1 % (ref 11.5–15.5)

## 2012-10-07 LAB — TYPE AND SCREEN: ABO/RH(D): O POS

## 2012-10-07 LAB — SURGICAL PCR SCREEN: Staphylococcus aureus: NEGATIVE

## 2012-10-07 LAB — PROTIME-INR: Prothrombin Time: 15 seconds (ref 11.6–15.2)

## 2012-10-07 MED ORDER — DOCUSATE SODIUM 100 MG PO CAPS
100.0000 mg | ORAL_CAPSULE | Freq: Two times a day (BID) | ORAL | Status: DC
  Administered 2012-10-07 – 2012-10-12 (×10): 100 mg via ORAL
  Filled 2012-10-07 (×14): qty 1

## 2012-10-07 MED ORDER — METHOCARBAMOL 100 MG/ML IJ SOLN: 500.0000 mg | Freq: Four times a day (QID) | INTRAMUSCULAR | Status: DC | PRN

## 2012-10-07 MED ORDER — ACETAMINOPHEN 325 MG PO TABS
325.0000 mg | ORAL_TABLET | Freq: Four times a day (QID) | ORAL | Status: DC | PRN
  Filled 2012-10-07: qty 2

## 2012-10-07 MED ORDER — METHOCARBAMOL 500 MG PO TABS
500.0000 mg | ORAL_TABLET | Freq: Four times a day (QID) | ORAL | Status: DC | PRN
  Administered 2012-10-10: 500 mg via ORAL
  Filled 2012-10-07: qty 1

## 2012-10-07 MED ORDER — ONDANSETRON HCL 4 MG/2ML IJ SOLN
4.0000 mg | Freq: Four times a day (QID) | INTRAMUSCULAR | Status: DC | PRN
  Administered 2012-10-07 – 2012-10-08 (×3): 4 mg via INTRAVENOUS
  Filled 2012-10-07 (×3): qty 2

## 2012-10-07 MED ORDER — ACETAMINOPHEN 325 MG PO TABS: 650.0000 mg | ORAL_TABLET | Freq: Four times a day (QID) | ORAL | Status: DC | PRN

## 2012-10-07 MED ORDER — ACETAMINOPHEN 650 MG RE SUPP: 650.0000 mg | Freq: Four times a day (QID) | RECTAL | Status: DC | PRN

## 2012-10-07 MED ORDER — ENOXAPARIN SODIUM 30 MG/0.3ML ~~LOC~~ SOLN
30.0000 mg | SUBCUTANEOUS | Status: DC
  Administered 2012-10-07: 30 mg via SUBCUTANEOUS
  Filled 2012-10-07 (×2): qty 0.3

## 2012-10-07 MED ORDER — POTASSIUM CHLORIDE IN NACL 20-0.9 MEQ/L-% IV SOLN
INTRAVENOUS | Status: DC
  Administered 2012-10-07 (×3): via INTRAVENOUS
  Administered 2012-10-08: 100 mL/h via INTRAVENOUS
  Administered 2012-10-08: 15:00:00 via INTRAVENOUS
  Administered 2012-10-08: 100 mL/h via INTRAVENOUS
  Administered 2012-10-09 – 2012-10-12 (×3): via INTRAVENOUS
  Filled 2012-10-07 (×13): qty 1000

## 2012-10-07 MED ORDER — PROMETHAZINE HCL 25 MG/ML IJ SOLN
12.5000 mg | Freq: Once | INTRAMUSCULAR | Status: AC
  Administered 2012-10-07: 12.5 mg via INTRAVENOUS
  Filled 2012-10-07: qty 1

## 2012-10-07 MED ORDER — PNEUMOCOCCAL VAC POLYVALENT 25 MCG/0.5ML IJ INJ
0.5000 mL | INJECTION | INTRAMUSCULAR | Status: AC
  Filled 2012-10-07: qty 0.5

## 2012-10-07 MED ORDER — METOCLOPRAMIDE HCL 5 MG/ML IJ SOLN
5.0000 mg | Freq: Four times a day (QID) | INTRAMUSCULAR | Status: DC | PRN
  Administered 2012-10-07 – 2012-10-08 (×3): 5 mg via INTRAVENOUS
  Filled 2012-10-07 (×3): qty 2

## 2012-10-07 MED ORDER — INSULIN ASPART 100 UNIT/ML ~~LOC~~ SOLN
0.0000 [IU] | Freq: Three times a day (TID) | SUBCUTANEOUS | Status: DC
  Administered 2012-10-07: 15 [IU] via SUBCUTANEOUS
  Administered 2012-10-07 – 2012-10-08 (×2): 5 [IU] via SUBCUTANEOUS
  Administered 2012-10-08: 3 [IU] via SUBCUTANEOUS
  Administered 2012-10-08: 8 [IU] via SUBCUTANEOUS
  Administered 2012-10-10 – 2012-10-11 (×2): 3 [IU] via SUBCUTANEOUS
  Administered 2012-10-11: 2 [IU] via SUBCUTANEOUS

## 2012-10-07 MED ORDER — POLYETHYLENE GLYCOL 3350 17 G PO PACK
17.0000 g | PACK | Freq: Every day | ORAL | Status: DC | PRN
  Administered 2012-10-10: 17 g via ORAL
  Filled 2012-10-07 (×2): qty 1

## 2012-10-07 MED ORDER — OXYCODONE HCL 5 MG PO TABS: 5.0000 mg | ORAL_TABLET | ORAL | Status: DC | PRN

## 2012-10-07 MED ORDER — INSULIN ASPART 100 UNIT/ML ~~LOC~~ SOLN: 0.0000 [IU] | Freq: Every day | SUBCUTANEOUS | Status: DC

## 2012-10-07 MED ORDER — SODIUM CHLORIDE 0.9 % IV SOLN: INTRAVENOUS | Status: DC

## 2012-10-07 MED ORDER — ZOLPIDEM TARTRATE 5 MG PO TABS: 5.0000 mg | ORAL_TABLET | Freq: Every evening | ORAL | Status: DC | PRN

## 2012-10-07 MED ORDER — ALUM & MAG HYDROXIDE-SIMETH 200-200-20 MG/5ML PO SUSP
30.0000 mL | Freq: Four times a day (QID) | ORAL | Status: DC | PRN
Start: 2012-10-07 — End: 2012-10-12
  Filled 2012-10-07: qty 30

## 2012-10-07 MED ORDER — MORPHINE SULFATE 2 MG/ML IJ SOLN
0.5000 mg | INTRAMUSCULAR | Status: DC | PRN
  Administered 2012-10-07: 0.5 mg via INTRAVENOUS
  Filled 2012-10-07: qty 1

## 2012-10-07 MED ORDER — HYDROMORPHONE HCL PF 1 MG/ML IJ SOLN: 0.5000 mg | INTRAMUSCULAR | Status: DC | PRN

## 2012-10-07 MED ORDER — PROMETHAZINE HCL 25 MG/ML IJ SOLN
12.5000 mg | Freq: Four times a day (QID) | INTRAMUSCULAR | Status: DC | PRN
  Administered 2012-10-07 – 2012-10-08 (×3): 25 mg via INTRAVENOUS
  Filled 2012-10-07 (×3): qty 1

## 2012-10-07 MED ORDER — HYDROCODONE-ACETAMINOPHEN 5-325 MG PO TABS
1.0000 | ORAL_TABLET | Freq: Four times a day (QID) | ORAL | Status: DC | PRN
  Administered 2012-10-10: 1 via ORAL
  Filled 2012-10-07: qty 1

## 2012-10-07 MED ORDER — ONDANSETRON HCL 4 MG PO TABS: 4.0000 mg | ORAL_TABLET | Freq: Four times a day (QID) | ORAL | Status: DC | PRN

## 2012-10-07 NOTE — Progress Notes (Signed)
Orthopedic Tech Progress Note Patient Details:  [name] 1946/02/24 [id number]  Patient ID: [name], female   DOB: April 23, 1946, 67 y.o.   MRN: [id number]   Irish Elders 10/07/2012, 2:54 PM  Bed cannot accommodate trapeze .

## 2012-10-07 NOTE — ED Notes (Signed)
Admitting MD at bedside.

## 2012-10-07 NOTE — H&P (Signed)
[facility] History and Physical  [name] [id number] DOB: 18-Aug-1945 DOA: 10/06/2012  Referring physician: EDP PCP: Redge Gainer, MD  Specialists:   Chief Complaint: Left Hip and Knee Pain after Falling  HPI: [name] is a 67 y.o. female who was sweeping off the steps at her home and tripped and fell onto her left side.  She had increased pain in her left hip and knee and EMS was called.  She was evaluated in the ED and was found to have a closed fracture of the Distal Left Femur.  She denies having any syncope or dizziness associated with her fall.   She is to be seen by Orthopedics Dr. Marcelino Scot in the AM.      Review of Systems: The patient denies anorexia, fever, weight loss, vision loss, decreased hearing, hoarseness, chest pain, syncope, dyspnea on exertion, peripheral edema, balance deficits, hemoptysis, abdominal pain, melena, hematochezia, severe indigestion/heartburn, hematuria, incontinence, genital sores, muscle weakness, suspicious skin lesions, transient blindness, difficulty walking, depression, unusual weight change, abnormal bleeding, enlarged lymph nodes, angioedema, and breast masses.    Past Medical History  Diagnosis Date  . Hypertension   . Hyperlipidemia   . Thyroid disease     hypothryoidism  . GERD (gastroesophageal reflux disease)   . GAD (generalized anxiety disorder)   . GIB (gastrointestinal bleeding)   . Diabetes mellitus   . CKD (chronic kidney disease), stage III   . Peripheral edema   . Anemia   . Colon polyps   . Coronary artery disease    Past Surgical History  Procedure Laterality Date  . None    . Incision and drainage perirectal abscess    . Retinopathy surgery       Medications:  HOME MEDS: Prior to Admission medications   Medication Sig Start Date End Date Taking? Authorizing Provider  atorvastatin (LIPITOR) 40 MG tablet Take 1 tablet (40 mg total) by mouth daily. 09/22/12  Yes Mary-Margaret Hassell Done, FNP   calcitRIOL (ROCALTROL) 0.25 MCG capsule Take 0.25 mcg by mouth daily.    Yes Historical Provider, MD  cloNIDine (CATAPRES) 0.2 MG tablet Take 1 tablet (0.2 mg total) by mouth 2 (two) times daily. 09/22/12  Yes Mary-Margaret Hassell Done, FNP  furosemide (LASIX) 40 MG tablet Take 40 mg by mouth daily.    Yes Historical Provider, MD  glimepiride (AMARYL) 2 MG tablet Take 2 mg by mouth daily before breakfast.   Yes Historical Provider, MD  levothyroxine (SYNTHROID, LEVOTHROID) 75 MCG tablet Take 1 tablet (75 mcg total) by mouth daily before breakfast. 09/22/12  Yes Mary-Margaret Hassell Done, FNP  losartan (COZAAR) 100 MG tablet Take 100 mg by mouth daily.     Yes Historical Provider, MD  metoCLOPramide (REGLAN) 5 MG tablet Take 1 tablet (5 mg total) by mouth 4 (four) times daily. 09/22/12  Yes Mary-Margaret Hassell Done, FNP  metoprolol (TOPROL-XL) 50 MG 24 hr tablet Take 50 mg by mouth daily.     Yes Historical Provider, MD  pantoprazole (PROTONIX) 40 MG tablet Take 1 tablet (40 mg total) by mouth 2 (two) times daily. 09/22/12  Yes Mary-Margaret Hassell Done, FNP  pioglitazone (ACTOS) 30 MG tablet Take 1 tablet (30 mg total) by mouth daily. 09/22/12  Yes Mary-Margaret Hassell Done, FNP    Allergies:  Allergies  Allergen Reactions  . Aspirin     vomiting  . Ciprofloxacin     unknown  . Codeine     Makes me sick   . Micardis (Telmisartan)  unknown  . Rofecoxib     unknown    Social History:   reports that she has never smoked. She does not have any smokeless tobacco history on file. She does not drink alcohol or use illicit drugs.  Family History: Family History  Problem Relation Age of Onset  . Cancer Father   . Colon cancer Father   . Prostate cancer Father   . Diabetes Father   . Coronary artery disease Mother 23  . Heart disease Mother   . Diabetes Mother   . Coronary artery disease Sister 23  . Diabetes Sister   . Diabetes Maternal Grandmother   . Diabetes Sister   . Diabetes Sister   . Diabetes Sister    . Diabetes Sister   . Diabetes Sister       Physical Exam:  GEN:  Pleasant 67 year old Obese Caucasian Female examined and in discomfort but no acute distress; cooperative with exam Filed Vitals:   10/07/12 0000 10/07/12 0015 10/07/12 0057 10/07/12 0100  BP: 169/54 182/57  190/78  Pulse: 79 87 93   Temp:   98.3 F (36.8 C)   TempSrc:   Oral   Resp: 24 16 16    SpO2: 99% 99%     Blood pressure 190/78, pulse 93, temperature 98.3 F (36.8 C), temperature source Oral, resp. rate 16, SpO2 99.00%. PSYCH: She is alert and oriented x4; does not appear anxious does not appear depressed; affect is normal HEENT: Normocephalic and Atraumatic, Mucous membranes pink; PERRLA; EOM intact; Fundi:  Benign;  No scleral icterus, Nares: Patent, Oropharynx: Clear, Edentulous ;  Neck:  FROM, no cervical lymphadenopathy nor thyromegaly or carotid bruit; no JVD; Breasts:: Not examined CHEST WALL: No tenderness CHEST: Normal respiration, clear to auscultation bilaterally HEART: Regular rate and rhythm; no murmurs rubs or gallops BACK: No kyphosis or scoliosis; no CVA tenderness ABDOMEN: Positive Bowel Sounds, Obese, soft non-tender; no masses, no organomegaly, no pannus; no intertriginous candida. Rectal Exam: Not done EXTREMITIES: No cyanosis, clubbing or edema; no ulcerations. Genitalia: not examined PULSES: 2+ and symmetric SKIN: Normal hydration no rash or ulceration CNS: Cranial nerves 2-12 grossly intact no focal neurologic deficit   Labs & Imaging Results for orders placed during the hospital encounter of 10/06/12 (from the past 48 hour(s))  CBC     Status: Abnormal   Collection Time    10/06/12  6:20 PM      Result Value Range   WBC 9.2  4.0 - 10.5 K/uL   RBC 4.01  3.87 - 5.11 MIL/uL   Hemoglobin 11.5 (*) 12.0 - 15.0 g/dL   HCT 34.6 (*) 36.0 - 46.0 %   MCV 86.3  78.0 - 100.0 fL   MCH 28.7  26.0 - 34.0 pg   MCHC 33.2  30.0 - 36.0 g/dL   RDW 14.2  11.5 - 15.5 %   Platelets 248  150 -  400 K/uL  COMPREHENSIVE METABOLIC PANEL     Status: Abnormal   Collection Time    10/06/12  6:20 PM      Result Value Range   Sodium 139  135 - 145 mEq/L   Potassium 3.7  3.5 - 5.1 mEq/L   Chloride 99  96 - 112 mEq/L   CO2 25  19 - 32 mEq/L   Glucose, Bld 541 (*) 70 - 99 mg/dL   BUN 30 (*) 6 - 23 mg/dL   Creatinine, Ser 2.03 (*) 0.50 - 1.10 mg/dL   Calcium  8.0 (*) 8.4 - 10.5 mg/dL   Total Protein 6.9  6.0 - 8.3 g/dL   Albumin 3.4 (*) 3.5 - 5.2 g/dL   AST 21  0 - 37 U/L   ALT 7  0 - 35 U/L   Alkaline Phosphatase 178 (*) 39 - 117 U/L   Total Bilirubin 0.4  0.3 - 1.2 mg/dL   GFR calc non Af Amer 24 (*) >90 mL/min   GFR calc Af Amer 28 (*) >90 mL/min   Comment:            The eGFR has been calculated     using the CKD EPI equation.     This calculation has not been     validated in all clinical     situations.     eGFR's persistently     <90 mL/min signify     possible Chronic Kidney Disease.  GLUCOSE, CAPILLARY     Status: Abnormal   Collection Time    10/06/12  6:22 PM      Result Value Range   Glucose-Capillary 495 (*) 70 - 99 mg/dL   Comment 1 Documented in Chart     Comment 2 Notify RN    URINALYSIS, ROUTINE W REFLEX MICROSCOPIC     Status: Abnormal   Collection Time    10/06/12  6:43 PM      Result Value Range   Color, Urine YELLOW  YELLOW   APPearance CLEAR  CLEAR   Specific Gravity, Urine 1.012  1.005 - 1.030   pH 5.5  5.0 - 8.0   Glucose, UA >1000 (*) NEGATIVE mg/dL   Hgb urine dipstick NEGATIVE  NEGATIVE   Bilirubin Urine NEGATIVE  NEGATIVE   Ketones, ur NEGATIVE  NEGATIVE mg/dL   Protein, ur NEGATIVE  NEGATIVE mg/dL   Urobilinogen, UA 0.2  0.0 - 1.0 mg/dL   Nitrite NEGATIVE  NEGATIVE   Leukocytes, UA SMALL (*) NEGATIVE  URINE MICROSCOPIC-ADD ON     Status: Abnormal   Collection Time    10/06/12  6:43 PM      Result Value Range   Squamous Epithelial / LPF RARE  RARE   WBC, UA 3-6  <3 WBC/hpf   Bacteria, UA MANY (*) RARE  GLUCOSE, CAPILLARY     Status:  Abnormal   Collection Time    10/06/12  8:19 PM      Result Value Range   Glucose-Capillary 518 (*) 70 - 99 mg/dL  KETONES, QUALITATIVE     Status: None   Collection Time    10/06/12  8:22 PM      Result Value Range   Acetone, Bld NEGATIVE  NEGATIVE  POCT I-STAT 3, BLOOD GAS (G3P V)     Status: Abnormal   Collection Time    10/06/12  8:49 PM      Result Value Range   pH, Ven 7.379 (*) 7.250 - 7.300   pCO2, Ven 44.1 (*) 45.0 - 50.0 mmHg   pO2, Ven 34.0  30.0 - 45.0 mmHg   Bicarbonate 26.1 (*) 20.0 - 24.0 mEq/L   TCO2 27  0 - 100 mmol/L   O2 Saturation 66.0     Patient temperature 98.0 F     Collection site BRACHIAL ARTERY     Sample type VENOUS     Comment NOTIFIED PHYSICIAN    PROTIME-INR     Status: None   Collection Time    10/06/12  9:00 PM  Result Value Range   Prothrombin Time 14.1  11.6 - 15.2 seconds   INR 1.10  0.00 - 1.49     Radiological Exams on Admission: Dg Chest 2 View  10/06/2012   *RADIOLOGY REPORT*  Clinical Data: Status post fall.  Knee pain  CHEST - 2 VIEW  Comparison: 01/08/2011  Findings: Heart size is normal.  There is no pleural effusion or edema.  Asymmetric elevation of the right hemidiaphragm noted.  There is no airspace consolidation identified.  Review of the visualized osseous structures is unremarkable.  IMPRESSION:  1.  No acute cardiopulmonary abnormalities. 2.  Asymmetric elevation of the right hemidiaphragm.   Original Report Authenticated By: Kerby Moors, M.D.   Ct Knee Left Wo Contrast  10/06/2012   *RADIOLOGY REPORT*  Clinical Data: Femur fracture.  Fall.  CT OF THE LEFT KNEE WITHOUT CONTRAST  Technique:  Multidetector CT imaging was performed according to the standard protocol. Multiplanar CT image reconstructions were also generated.  Comparison: 10/06/2012  Findings: Comminuted distal femoral fracture extends from the distal metadiaphysis to the intercondylar notch and articular surfaces of the lateral femoral condyle.  Several prior  fracture planes extend into the lateral femoral trochlear groove and central intercondylar notch  No significantly depressed cortical fragments extend into the comminuted fracture planes.  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  Chronically fragmented osteophytes along the superior patellar margin.  Hemarthrosis noted.  Suspected lipohemarthrosis and in the Baker's cyst.  Amorphous calcification along the expected tract of the popliteus tendon.  Irregular amorphous calcification along the distal iliotibial band.  IMPRESSION:  1.  Prominently comminuted distal femoral fracture extending from the metaphysis to the intercondylar notch and lateral condylar articular surface. No cortical fragments embedded within the fracture planes.  2.  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   Original Report Authenticated By: Van Clines, M.D.   Dg Knee Complete 4 Views Left  10/06/2012   *RADIOLOGY REPORT*  Clinical Data: Fall with pain and swelling.  LEFT KNEE - COMPLETE 4+ VIEW  Comparison: None.  Findings: Vascular calcifications.  Three compartment underlying osteoarthritis.  Comminuted, intra-articular fracture of the distal femur.  Fracture fragment extends out the medial aspect of the supracondylar region and extends distally into the intercondylar region of the femur.  A bone fragment projecting proximal to the patella on the lateral view may have arisen from the distal femur. No definite source within the patella.  Moderate suprapatellar joint effusion.  IMPRESSION: Intra-articular fracture of the distal femur with probable comminution and concurrent suprapatellar joint effusion.  Consider CT.  Underlying osteopenia.  Advanced vascular calcifications.  Likely related to diabetes.   Original Report Authenticated By: Abigail Miyamoto, M.D.    EKG: Independently reviewed.   Assessment/Plan Principal Problem:   Fracture of femur, distal, left, closed Active Problems:   Fall at home    Hyperglycemia   DM   HYPERLIPIDEMIA   HYPERTENSION   RENAL INSUFFICIENCY   1.  Closed Fracture of Left Distal Femur S/P Mechanical Fall-  For Surgical Evaluation and Surgery in AM by Orthopedics:  Dr. Marcelino Scot.   Pain Control with IV Dilaudid PRN.    2.  Hyperglycemia/ DM2-  Monitor and Add SSI coverage PRN.     3.  HTN- Monitor Blood Pressures, adjust BP meds ( Metoprolol and Furosemide) as needed.     4.  CKD/CRI- Monitor BUN/Cr levels.     5.  Hyperlipidemia-  Continue atorvastatin Simvastatin.     6.  DVT Prophylaxis  Code Status:   FULL CODE Family Communication: Daughter at Bedside Disposition Plan:  TBA  Time spent: [facility] Pager [phone]  If 7PM-7AM, please contact night-coverage [email or web address] Password [email or web address] 10/07/2012, 1:24 AM

## 2012-10-07 NOTE — Care Management Note (Signed)
CARE MANAGEMENT NOTE 10/07/2012  Patient:  [name], [name]   Account Number:  [id number]  Date Initiated:  10/07/2012  Documentation initiated by:  Ricki Miller  Subjective/Objective Assessment:   67 yr old female s/p fall     Action/Plan:   OR on 5/19 or 20. CM will follow     Per UR Regulation:    If discussed at Long Length of Stay Meetings, dates discussed:    Comments:

## 2012-10-07 NOTE — Progress Notes (Signed)
PT Cancellation Note  Patient Details Name: [name] MRN: [id number] DOB: 12/26/1945   Cancelled Treatment:    Reason Eval/Treat Not Completed: Medical issues which prohibited therapy.  Re-attempted eval in PM.  Pt still presenting with N&V.  PT to check back tomorrow.   Lorriane Shire 10/07/2012, 3:13 PM  Lorrin Goodell, PT  Office # [phone] Pager [phone]

## 2012-10-07 NOTE — Progress Notes (Signed)
[facility] Progress Note        Subjective (Pt was seen by ortho surgery last night- see consult note)  No complaints of pain C/o nausea Denies any other acute issues  Not particularly hungry   Objective  BP 182/67  Pulse 97  Temp(Src) 99.3 F (37.4 C) (Oral)  Resp 20  SpO2 100%  Patient Vitals for the past 24 hrs:  BP Temp Temp src Pulse Resp SpO2  10/07/12 0800 - - - - 20 100 %  10/07/12 0429 182/67 mmHg 99.3 F (37.4 C) Oral 97 16 100 %  10/07/12 0100 190/78 mmHg - - - - -  10/07/12 0057 - 98.3 F (36.8 C) Oral 93 16 97 %  10/07/12 0015 182/57 mmHg - - 87 16 99 %  10/07/12 0000 169/54 mmHg - - 79 24 99 %  10/06/12 2351 204/75 mmHg - - - - -  10/06/12 2341 198/68 mmHg 98.6 F (37 C) Oral 80 16 99 %  10/06/12 2235 183/65 mmHg - - 72 18 100 %  10/06/12 2105 170/65 mmHg - - 70 21 99 %  10/06/12 1819 171/107 mmHg 98.2 F (36.8 C) Oral 90 20 98 %    Intake/Output     05/16 0701 - 05/17 0700 05/17 0701 - 05/18 0700   Urine 650    Total Output 650     Net -650          Emesis Occurrence 2 x      Labs Results for [name], [name] (MRN [id number]) as of 10/07/2012 12:03  Ref. Range 10/07/2012 05:45  Sodium Latest Range: 135-145 mEq/L 140  Potassium Latest Range: 3.5-5.1 mEq/L 3.1 (L)  Chloride Latest Range: 96-112 mEq/L 101  CO2 Latest Range: 19-32 mEq/L 28  BUN Latest Range: 6-23 mg/dL 27 (H)  Creatinine Latest Range: 0.50-1.10 mg/dL 1.72 (H)  Calcium Latest Range: 8.4-10.5 mg/dL 7.9 (L)  GFR calc non Af Amer Latest Range: >90 mL/min 30 (L)  GFR calc Af Amer Latest Range: >90 mL/min 34 (L)  Glucose Latest Range: 70-99 mg/dL 427 (H)  Albumin Latest Range: 3.5-5.2 g/dL 3.2 (L)  WBC Latest Range: 4.0-10.5 K/uL 11.9 (H)  RBC Latest Range: 3.87-5.11 MIL/uL 3.49 (L)  Hemoglobin Latest Range: 12.0-15.0 g/dL 9.8 (L)  HCT Latest Range: 36.0-46.0 % 29.5 (L)  MCV Latest Range: 78.0-100.0 fL 84.5  MCH Latest Range: 26.0-34.0 pg 28.1  MCHC Latest Range:  30.0-36.0 g/dL 33.2  RDW Latest Range: 11.5-15.5 % 14.1  Platelets Latest Range: 150-400 K/uL 222  Prothrombin Time Latest Range: 11.6-15.2 seconds 15.0  INR Latest Range: 0.00-1.49  1.20    CBG (last 3)   Recent Labs  10/06/12 1822 10/06/12 2019  GLUCAP 495* 518*      Exam  Gen: awake and alert, resting in bed Lungs: clear anteriorly  Cardiac: s1 and s2 reg Abd: + BS, NT  Ext:            Left Lower Extremity  Motor and sensory functions intact  Ext warm  Compartments soft and NT  No acute changes in exam  Skin stable      Assessment and Plan    67 y/o white female s/p mechanical fall  1. fall 2. L distal femur fracture with intra-articular extension and shortening  After review of CT scan and radiographic assessment of bone quality we do think that pt would be best treated with distal femoral replacement/TKR  Pt may require specialized equipment, thus surgery  likely to take place mon or tues                        NWB L leg             Knee immobilizer for comfort             ACE to L leg for swelling control             Ice             Elevate             Suspect pt will need placement at d/c despite her desires to go home   3. Medical issues             Medical admission     DM   SSI    HTN   IM titrating meds              4 Pain management:             Low dose narcotics             Tylenol  5.DVT/PE prophylaxis:             Foot pumps for now  Start lovenox   6. CKD             Renal MD- [place]   7. Metabolic Bone Disease:             Pt takes vitamin d             Clinical pt seems to have osteoporosis given low energy mechanism             vitamin D levels pending   8. Activity:             OOB with assist             NWB Left leg  9. FEN/Foley/Lines:              will increase fluids to 100cc/hr given minimal PO intake and vomiting             CHO modified diet    Nausea/vomiting   Minimize narcs   + phenergan     Hypokalemia    May be due to vomiting, monitor    Foley   Maintain foley for strict I&O's   10. Impediments to fracture healing:             Low bone density             DM             Renal disease             Limited mobility  11. Dispo:          OR Monday or tues  Can work with therapies and be bed to chair, NWB L leg, knee immobilizer on     Jari Pigg, PA-C [facility] [phone] (P) 10/07/2012 12:02 PM

## 2012-10-07 NOTE — Progress Notes (Signed)
PT Cancellation Note  Patient Details Name: [name] MRN: [id number] DOB: 15-Jan-1946   Cancelled Treatment:    Reason Eval/Treat Not Completed: Medical issues which prohibited therapy (vomitting).  Unable to complete eval this AM due to pt sick, vomitting.   Lorriane Shire 10/07/2012, 10:52 AM  Lorrin Goodell, PT  Office # [phone] Pager [phone]

## 2012-10-07 NOTE — Progress Notes (Addendum)
[facility]  PROGRESS NOTE   I have seen and examined pt who is a 67yo with HTN, DM,CKD stage 3, CAD admitted per Dr Arnoldo Morale this am with closed fracture of the distal left femur s/p mechanical fall at home. Ortho consulted and pt seen by Dr Marcelino Scot and plan is for OR on Mon or Tues. She is c/o n/v with pain meds, will continue antiemetics, continue current management plan as per Dr Arnoldo Morale this am, replace k and follow.    presents to [facility] ER with slurred speech, left sided weakness, and left facial droop [facility]  Pager [phone]. If 7PM-7AM, please contact night-coverage at [email or web address], password [email or web address]  10/07/2012, 2:13 PM LOS: 1 day

## 2012-10-08 DIAGNOSIS — N390 Urinary tract infection, site not specified: Secondary | ICD-10-CM | POA: Diagnosis present

## 2012-10-08 DIAGNOSIS — S72499A Other fracture of lower end of unspecified femur, initial encounter for closed fracture: Secondary | ICD-10-CM

## 2012-10-08 DIAGNOSIS — S7290XA Unspecified fracture of unspecified femur, initial encounter for closed fracture: Secondary | ICD-10-CM | POA: Diagnosis not present

## 2012-10-08 DIAGNOSIS — I10 Essential (primary) hypertension: Secondary | ICD-10-CM | POA: Diagnosis not present

## 2012-10-08 DIAGNOSIS — E119 Type 2 diabetes mellitus without complications: Secondary | ICD-10-CM | POA: Diagnosis not present

## 2012-10-08 LAB — BASIC METABOLIC PANEL
BUN: 19 mg/dL (ref 6–23)
CO2: 24 mEq/L (ref 19–32)
Chloride: 105 mEq/L (ref 96–112)
Creatinine, Ser: 1.33 mg/dL — ABNORMAL HIGH (ref 0.50–1.10)

## 2012-10-08 LAB — CBC
HCT: 30.5 % — ABNORMAL LOW (ref 36.0–46.0)
MCV: 85.7 fL (ref 78.0–100.0)
RBC: 3.56 MIL/uL — ABNORMAL LOW (ref 3.87–5.11)
RDW: 14.4 % (ref 11.5–15.5)
WBC: 17.3 10*3/uL — ABNORMAL HIGH (ref 4.0–10.5)

## 2012-10-08 LAB — GLUCOSE, CAPILLARY
Glucose-Capillary: 233 mg/dL — ABNORMAL HIGH (ref 70–99)
Glucose-Capillary: 269 mg/dL — ABNORMAL HIGH (ref 70–99)
Glucose-Capillary: 165 mg/dL — ABNORMAL HIGH (ref 70–99)
Glucose-Capillary: 59 mg/dL — ABNORMAL LOW (ref 70–99)

## 2012-10-08 MED ORDER — DEXTROSE 5 % IV SOLN
1.0000 g | INTRAVENOUS | Status: DC
  Administered 2012-10-08 – 2012-10-09 (×2): 1 g via INTRAVENOUS
  Filled 2012-10-08 (×3): qty 10

## 2012-10-08 MED ORDER — PANTOPRAZOLE SODIUM 40 MG IV SOLR
40.0000 mg | Freq: Two times a day (BID) | INTRAVENOUS | Status: DC
  Administered 2012-10-08 – 2012-10-10 (×3): 40 mg via INTRAVENOUS
  Filled 2012-10-08 (×6): qty 40

## 2012-10-08 MED ORDER — HYDRALAZINE HCL 20 MG/ML IJ SOLN
10.0000 mg | Freq: Four times a day (QID) | INTRAMUSCULAR | Status: DC | PRN
  Administered 2012-10-08: 10 mg via INTRAVENOUS
  Filled 2012-10-08: qty 1

## 2012-10-08 MED ORDER — POTASSIUM CHLORIDE CRYS ER 20 MEQ PO TBCR
20.0000 meq | EXTENDED_RELEASE_TABLET | Freq: Once | ORAL | Status: AC
  Administered 2012-10-09: 20 meq via ORAL

## 2012-10-08 MED ORDER — METOCLOPRAMIDE HCL 5 MG PO TABS
5.0000 mg | ORAL_TABLET | Freq: Four times a day (QID) | ORAL | Status: DC
  Administered 2012-10-08 – 2012-10-12 (×13): 5 mg via ORAL
  Filled 2012-10-08 (×20): qty 1

## 2012-10-08 MED ORDER — LEVOTHYROXINE SODIUM 75 MCG PO TABS
75.0000 ug | ORAL_TABLET | Freq: Every day | ORAL | Status: DC
  Administered 2012-10-08 – 2012-10-12 (×5): 75 ug via ORAL
  Filled 2012-10-08 (×6): qty 1

## 2012-10-08 MED ORDER — GLIMEPIRIDE 2 MG PO TABS
2.0000 mg | ORAL_TABLET | Freq: Every day | ORAL | Status: DC
  Administered 2012-10-10 – 2012-10-12 (×3): 2 mg via ORAL
  Filled 2012-10-08 (×5): qty 1

## 2012-10-08 MED ORDER — METOPROLOL SUCCINATE ER 50 MG PO TB24
50.0000 mg | ORAL_TABLET | Freq: Every day | ORAL | Status: DC
  Administered 2012-10-08 – 2012-10-12 (×5): 50 mg via ORAL
  Filled 2012-10-08 (×5): qty 1

## 2012-10-08 MED ORDER — METOCLOPRAMIDE HCL 5 MG/ML IJ SOLN
5.0000 mg | Freq: Three times a day (TID) | INTRAMUSCULAR | Status: DC
  Administered 2012-10-08: 5 mg via INTRAVENOUS
  Filled 2012-10-08: qty 2

## 2012-10-08 MED ORDER — CLONIDINE HCL 0.2 MG PO TABS
0.2000 mg | ORAL_TABLET | Freq: Two times a day (BID) | ORAL | Status: DC
  Administered 2012-10-08 – 2012-10-12 (×9): 0.2 mg via ORAL
  Filled 2012-10-08 (×12): qty 1

## 2012-10-08 MED ORDER — POTASSIUM CHLORIDE CRYS ER 20 MEQ PO TBCR: 40.0000 meq | EXTENDED_RELEASE_TABLET | Freq: Once | ORAL | Status: DC

## 2012-10-08 MED ORDER — PANTOPRAZOLE SODIUM 40 MG IV SOLR: 40.0000 mg | Freq: Two times a day (BID) | INTRAVENOUS | Status: DC

## 2012-10-08 MED ORDER — CALCITRIOL 0.25 MCG PO CAPS
0.2500 ug | ORAL_CAPSULE | Freq: Every day | ORAL | Status: DC
  Administered 2012-10-08 – 2012-10-12 (×4): 0.25 ug via ORAL
  Filled 2012-10-08 (×7): qty 1

## 2012-10-08 MED ORDER — POTASSIUM CHLORIDE 10 MEQ/100ML IV SOLN
10.0000 meq | INTRAVENOUS | Status: AC
  Administered 2012-10-09 (×4): 10 meq via INTRAVENOUS
  Filled 2012-10-08 (×4): qty 100

## 2012-10-08 NOTE — Clinical Social Work Placement (Addendum)
    [facility] WORK PLACEMENT NOTE 10/08/2012  Patient:  [name], [name]  Account Number:  [id number] Admit date:  10/06/2012  Clinical Social Worker:  Otilio Saber  Date/time:  10/08/2012 11:49 AM  Clinical Social Work is seeking post-discharge placement for this patient at the following level of care:   [facility]   (*CSW will update this form in Epic as items are completed)   10/08/2012  Patient/family provided with [place] Department of Clinical Social Work's list of facilities offering this level of care within the geographic area requested by the patient (or if unable, by the patient's family).  10/08/2012  Patient/family informed of their freedom to choose among providers that offer the needed level of care, that participate in Medicare, Medicaid or managed care program needed by the patient, have an available bed and are willing to accept the patient.  10/08/2012  Patient/family informed of [facility]' ownership interest in [facility], as well as of the fact that they are under no obligation to receive care at this facility.  PASARR submitted to EDS on 10/08/2012 PASARR number received from EDS on 10/08/2012  FL2 transmitted to all facilities in geographic area requested by pt/family on  10/08/2012 FL2 transmitted to all facilities within larger geographic area on   Patient informed that his/her managed care company has contracts with or will negotiate with  certain facilities, including the following:     Patient/family informed of bed offers received:  10/09/12  Patient chooses bed at [facility] Physician recommends and patient chooses bed at    Patient to be transferred to  on  10/12/2012 Patient to be transferred to facility by [facility]  The following physician request were entered in Epic:   Additional Comments:

## 2012-10-08 NOTE — Progress Notes (Signed)
[facility] Progress Note        Subjective   No pain Still c/o nausea Dec PO intake   Denies h/a or CP No SOB   Objective  BP 206/86  Pulse 105  Temp(Src) 98.5 F (36.9 C) (Oral)  Resp 20  SpO2 100%  Patient Vitals for the past 24 hrs:  BP Temp Temp src Pulse Resp SpO2  10/08/12 0958 - - - 105 - -  10/08/12 0815 206/86 mmHg - - - - -  10/08/12 0645 211/82 mmHg - - 107 - -  10/08/12 0555 213/83 mmHg 98.5 F (36.9 C) Oral 107 20 100 %  10/07/12 2234 169/57 mmHg 98.9 F (37.2 C) Oral 90 18 91 %  10/07/12 2000 - - - - 29 91 %  10/07/12 1648 - - - - 20 99 %  10/07/12 1215 - - - - 18 98 %    Intake/Output     05/17 0701 - 05/18 0700 05/18 0701 - 05/19 0700   P.O. 120    I.V. 400    Total Intake 520     Urine 450    Total Output 450     Net +70            Labs Results for [name] (MRN [id number]) as of 10/08/2012 11:38  Ref. Range 10/08/2012 05:36  Sodium Latest Range: 135-145 mEq/L 145  Potassium Latest Range: 3.5-5.1 mEq/L 3.1 (L)  Chloride Latest Range: 96-112 mEq/L 105  CO2 Latest Range: 19-32 mEq/L 24  BUN Latest Range: 6-23 mg/dL 19  Creatinine Latest Range: 0.50-1.10 mg/dL 1.33 (H)  Calcium Latest Range: 8.4-10.5 mg/dL 8.0 (L)  GFR calc non Af Amer Latest Range: >90 mL/min 40 (L)  GFR calc Af Amer Latest Range: >90 mL/min 47 (L)  Glucose Latest Range: 70-99 mg/dL 228 (H)  WBC Latest Range: 4.0-10.5 K/uL 17.3 (H)  RBC Latest Range: 3.87-5.11 MIL/uL 3.56 (L)  Hemoglobin Latest Range: 12.0-15.0 g/dL 10.1 (L)  HCT Latest Range: 36.0-46.0 % 30.5 (L)  MCV Latest Range: 78.0-100.0 fL 85.7  MCH Latest Range: 26.0-34.0 pg 28.4  MCHC Latest Range: 30.0-36.0 g/dL 33.1  RDW Latest Range: 11.5-15.5 % 14.4  Platelets Latest Range: 150-400 K/uL 219   Results for [name] (MRN [id number]) as of 10/08/2012 11:38  Ref. Range 10/07/2012 12:30  Hemoglobin A1C Latest Range: <5.7 % 8.2 (H)     Exam  Gen: awake and alert, resting in  bed, still c/o nausea  Lungs: clear anteriorly   Cardiac: s1 and s2 reg Abd: + BS, NT  Ext:             Left Lower Extremity             Motor and sensory functions intact             Ext warm             Compartments soft and NT             No acute changes in exam             Skin stable     Assessment and Plan    67 y/o white female s/p mechanical fall  1. fall 2. L distal femur fracture with intra-articular extension and shortening             After review of CT scan and radiographic assessment of bone quality we do think that pt would be best  treated with distal femoral replacement/TKR             Pt may require specialized equipment, thus surgery likely to take place mon or tues                        NWB L leg             Knee immobilizer for comfort             ACE to L leg for swelling control             Ice             Elevate             Suspect pt will need placement at d/c despite her desires to go home   3. Medical issues             Medical admission                DM                         SSI               HTN                         IM titrating meds   High systolic pressure this am               4 Pain management:             Low dose narcotics             Tylenol  5.DVT/PE prophylaxis:             Foot pumps for now             Start lovenox   6. CKD             Renal MD- [place]   7. Metabolic Bone Disease:             Pt takes vitamin d             Clinical pt seems to have osteoporosis given low energy mechanism             vitamin D levels pending   8. Activity:             OOB with assist             NWB Left leg  9. FEN/Foley/Lines:              will increase fluids to 100cc/hr given minimal PO intake and vomiting             CHO modified diet                          Nausea/vomiting                         Minimize narcs                         + phenergan               Hypokalemia  May be  due to vomiting, monitor                          Foley                         Maintain foley for strict I&O's   Will make NPO at MN in case we are able to get pt scheduled for tomorrow   10. UTI  Urine cx prelim shows gram neg rods  Started on rocephin  Await final C&S  11. Impediments to fracture healing:             Low bone density             DM             Renal disease             Limited mobility  12. Dispo:          OR Monday or tues             Can work with therapies and be bed to chair, NWB L leg, knee immobilizer on     Jari Pigg, PA-C [facility] [phone] (P) 10/08/2012 11:36 AM

## 2012-10-08 NOTE — Clinical Social Work Psychosocial (Signed)
     Clinical Social Work Department BRIEF PSYCHOSOCIAL ASSESSMENT 10/08/2012  Patient:  [name], [name]     Account Number:  [id number]     Admit date:  10/06/2012  Clinical Social Worker:  Otilio Saber  Date/Time:  10/08/2012 11:35 AM  Referred by:  Physician  Date Referred:  10/07/2012 Referred for  SNF Placement   Other Referral:   Interview type:  Patient Other interview type:    PSYCHOSOCIAL DATA Living Status:  HUSBAND Admitted from facility:   Level of care:   Primary support name:  Synetta Fail Primary support relationship to patient:  SPOUSE Degree of support available:   supportive    CURRENT CONCERNS Current Concerns  Post-Acute Placement   Other Concerns:    SOCIAL WORK ASSESSMENT / PLAN Clinical Social Worker received referral for potential SNF placement at discharge. CSW introduced self and explained reason for visit. CSW provided SNF packet to patient. Patient reported that she would prefer to return home with her husband if able, but is agreeable for CSW to initiate SNF search in [place]. if she needs it.    CSW will complete FL2 for MD's signature and will update patient when bed offers are made.   Assessment/plan status:  Psychosocial Support/Ongoing Assessment of Needs Other assessment/ plan:   Information/referral to community resources:   SNF packet    PATIENTS/FAMILYS RESPONSE TO PLAN OF CARE: Patient reported that she would prefer to return back home with her husband if able. Patient  is agreeable for CSW to initiate a SNF search as a back up.

## 2012-10-08 NOTE — Progress Notes (Signed)
[facility] PROGRESS NOTE  [name] [id number] DOB: 1946-05-10 DOA: 10/06/2012 PCP: Redge Gainer, MD  Assessment/Plan: 1. Closed Fracture of Left Distal Femur S/P Mechanical Fall-  -per ortho Handy, surgery plan in am ort hurday.  -continue Pain Control with IV Dilaudid PRN.  2. Hyperglycemia/ DM2-  -resume amaryl, continue SSI coverage PRN.  3. HTN, uncontrolled -resume clonidine, metoprolol -follow and resume lasix when appropriate -add prn hydralazine 4. CKD/CRI- -improving off lasix, follow.  5. Hyperlipidemia- Continue atorvastatin Simvastatin.  6.GNR UTI - Rocephin, follow culture ID and sens 7. DVT Prophylaxis      Code Status: full Family Communication: directly with with pt at bedside  Disposition Plan: to SNF post op   Consultants:  ortho  Procedures:  none  Antibiotics:  Rocephin- started on 5/18  HPI/Subjective: C/o nausea and spitting up.  Objective: Filed Vitals:   10/08/12 0815 10/08/12 0958 10/08/12 1156 10/08/12 1337  BP: 206/86  186/76 142/58  Pulse:  105  85  Temp:    98.7 F (37.1 C)  TempSrc:    Oral  Resp:    16  SpO2:    97%    Intake/Output Summary (Last 24 hours) at 10/08/12 1508 Last data filed at 10/07/12 2300  Gross per 24 hour  Intake    520 ml  Output    450 ml  Net     70 ml   There were no vitals filed for this visit.  Exam:   General: alert and oriented x3, in NAD  Cardiovascular: RRR  Respiratory: CTAB  Abdomen: soft+BS NT/ND  Extremitiesl: L. In immobilizer, no cyanosis and no edema  Data Reviewed: Basic Metabolic Panel:  Recent Labs Lab 10/06/12 1820 10/07/12 0545 10/08/12 0536  NA 139 140 145  K 3.7 3.1* 3.1*  CL 99 101 105  CO2 25 28 24   GLUCOSE 541* 427* 228*  BUN 30* 27* 19  CREATININE 2.03* 1.72* 1.33*  CALCIUM 8.0* 7.9* 8.0*   Liver Function Tests:  Recent Labs Lab 10/06/12 1820 10/07/12 0545  AST 21  --   ALT 7  --   ALKPHOS 178*  --   BILITOT 0.4  --    PROT 6.9  --   ALBUMIN 3.4* 3.2*   No results found for this basename: LIPASE, AMYLASE,  in the last 168 hours No results found for this basename: AMMONIA,  in the last 168 hours CBC:  Recent Labs Lab 10/06/12 1820 10/07/12 0545 10/08/12 0536  WBC 9.2 11.9* 17.3*  HGB 11.5* 9.8* 10.1*  HCT 34.6* 29.5* 30.5*  MCV 86.3 84.5 85.7  PLT 248 222 219   Cardiac Enzymes: No results found for this basename: CKTOTAL, CKMB, CKMBINDEX, TROPONINI,  in the last 168 hours BNP (last 3 results) No results found for this basename: PROBNP,  in the last 8760 hours CBG:  Recent Labs Lab 10/07/12 1205 10/07/12 1651 10/07/12 2209 10/08/12 0728 10/08/12 1129  GLUCAP 388* 249* 190* 233* 269*    Recent Results (from the past 240 hour(s))  URINE CULTURE     Status: None   Collection Time    10/06/12  6:43 PM      Result Value Range Status   Specimen Description URINE, RANDOM   Final   Special Requests NONE   Final   Culture  Setup Time 10/07/2012 01:32   Final   Colony Count >=100,000 COLONIES/ML   Final   Culture GRAM NEGATIVE RODS   Final   Report Status  PENDING   Incomplete  SURGICAL PCR SCREEN     Status: None   Collection Time    10/07/12  4:28 AM      Result Value Range Status   MRSA, PCR NEGATIVE  NEGATIVE Final   Staphylococcus aureus NEGATIVE  NEGATIVE Final   Comment:            The Xpert SA Assay (FDA     approved for NASAL specimens     in patients over 43 years of age),     is one component of     a comprehensive surveillance     program.  Test performance has     been validated by [employer] for patients greater     than or equal to 39 year old.     It is not intended     to diagnose infection nor to     guide or monitor treatment.     Studies: Dg Chest 2 View  10/06/2012   *RADIOLOGY REPORT*  Clinical Data: Status post fall.  Knee pain  CHEST - 2 VIEW  Comparison: 01/08/2011  Findings: Heart size is normal.  There is no pleural effusion or edema.   Asymmetric elevation of the right hemidiaphragm noted.  There is no airspace consolidation identified.  Review of the visualized osseous structures is unremarkable.  IMPRESSION:  1.  No acute cardiopulmonary abnormalities. 2.  Asymmetric elevation of the right hemidiaphragm.   Original Report Authenticated By: Kerby Moors, M.D.   Ct Knee Left Wo Contrast  10/06/2012   *RADIOLOGY REPORT*  Clinical Data: Femur fracture.  Fall.  CT OF THE LEFT KNEE WITHOUT CONTRAST  Technique:  Multidetector CT imaging was performed according to the standard protocol. Multiplanar CT image reconstructions were also generated.  Comparison: 10/06/2012  Findings: Comminuted distal femoral fracture extends from the distal metadiaphysis to the intercondylar notch and articular surfaces of the lateral femoral condyle.  Several prior fracture planes extend into the lateral femoral trochlear groove and central intercondylar notch  No significantly depressed cortical fragments extend into the comminuted fracture planes.  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  Chronically fragmented osteophytes along the superior patellar margin.  Hemarthrosis noted.  Suspected lipohemarthrosis and in the Baker's cyst.  Amorphous calcification along the expected tract of the popliteus tendon.  Irregular amorphous calcification along the distal iliotibial band.  IMPRESSION:  1.  Prominently comminuted distal femoral fracture extending from the metaphysis to the intercondylar notch and lateral condylar articular surface. No cortical fragments embedded within the fracture planes.  2.  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   Original Report Authenticated By: Van Clines, M.D.   Dg Knee Complete 4 Views Left  10/06/2012   *RADIOLOGY REPORT*  Clinical Data: Fall with pain and swelling.  LEFT KNEE - COMPLETE 4+ VIEW  Comparison: None.  Findings: Vascular calcifications.  Three compartment underlying osteoarthritis.   Comminuted, intra-articular fracture of the distal femur.  Fracture fragment extends out the medial aspect of the supracondylar region and extends distally into the intercondylar region of the femur.  A bone fragment projecting proximal to the patella on the lateral view may have arisen from the distal femur. No definite source within the patella.  Moderate suprapatellar joint effusion.  IMPRESSION: Intra-articular fracture of the distal femur with probable comminution and concurrent suprapatellar joint effusion.  Consider CT.  Underlying osteopenia.  Advanced vascular calcifications.  Likely related to diabetes.   Original Report Authenticated By: Abigail Miyamoto,  M.D.    Scheduled Meds: . calcitRIOL  0.25 mcg Oral Daily  . cefTRIAXone (ROCEPHIN)  IV  1 g Intravenous Q24H  . cloNIDine  0.2 mg Oral BID  . docusate sodium  100 mg Oral BID  . [START ON 10/09/2012] glimepiride  2 mg Oral QAC breakfast  . insulin aspart  0-15 Units Subcutaneous TID WC  . insulin aspart  0-5 Units Subcutaneous QHS  . levothyroxine  75 mcg Oral QAC breakfast  . metoCLOPramide  5 mg Oral QID  . metoprolol succinate  50 mg Oral Daily  . pantoprazole (PROTONIX) IV  40 mg Intravenous BID AC  . pneumococcal 23 valent vaccine  0.5 mL Intramuscular Tomorrow-1000   Continuous Infusions: . 0.9 % NaCl with KCl 20 mEq / L 100 mL/hr at 10/08/12 1453    Principal Problem:   Fracture of femur, distal, left, closed Active Problems:   DM   HYPERLIPIDEMIA   HYPERTENSION   RENAL INSUFFICIENCY   Fall at home   Hyperglycemia   UTI (urinary tract infection)    Time spent: [facility] Pager [phone]. If 7PM-7AM, please contact night-coverage at [email or web address], password [facility] 10/08/2012, 3:08 PM  LOS: 2 days

## 2012-10-08 NOTE — Evaluation (Signed)
Physical Therapy Evaluation Patient Details Name: [name] MRN: [id number] DOB: [birth year] Today's Date: 10/08/2012 Time: [id number] PT Time Calculation (min): 29 min  PT Assessment / Plan / Recommendation Clinical Impression  Pt. was admitted with distal femur fx. L LE resulting from a fall.  Ssurgery is pending.  she presents to PT with a decrease in her usual level of functional mobility and an inability to ambulate.  She needs acute PT to address these and below issues.  depending on type of surgery and resultatnt WB status restrictions, pt. is likely to need ST SNF for rehab post acute.  Will follow for transfers in the interim.    PT Assessment  Patient needs continued PT services    Follow Up Recommendations  SNF;Supervision/Assistance - 24 hour;Supervision for mobility/OOB (depending on type of surgery she has and WB status)    Does the patient have the potential to tolerate intense rehabilitation      Barriers to Discharge Decreased caregiver support      Equipment Recommendations  None recommended by PT (has access to RW and WC, 3n1)    Recommendations for Other Services     Frequency Min 6X/week    Precautions / Restrictions Precautions Precautions: Fall Required Braces or Orthoses: Knee Immobilizer - Left (for comfort) Knee Immobilizer - Left: On at all times Restrictions Weight Bearing Restrictions: Yes LLE Weight Bearing: Non weight bearing   Pertinent Vitals/Pain Pain 2/10 in left knee area.  Pt. Positioned for comfort after mobility.      Mobility  Bed Mobility Bed Mobility: Supine to Sit Supine to Sit: 4: Min assist Details for Bed Mobility Assistance: cues and min assist to manage L LE to edge of bed Transfers Transfers: Sit to Stand;Stand to Sit Sit to Stand: 1: +2 Total assist;From bed;With upper extremity assist Sit to Stand: Patient Percentage: 70% Stand to Sit: 1: +2 Total assist;With upper extremity assist;With armrests;To  chair/3-in-1 Stand to Sit: Patient Percentage: 70% Details for Transfer Assistance: cues for technique and to remain NWB L LE, min assist for safety and stability with second person needed for safety Ambulation/Gait Ambulation/Gait Assistance: Not tested (comment) Assistive device: Rolling walker    Exercises     PT Diagnosis: Difficulty walking;Other (comment);Acute pain (difficulty in transfers)  PT Problem List: Decreased activity tolerance;Decreased mobility;Decreased knowledge of use of DME;Decreased knowledge of precautions;Pain PT Treatment Interventions: DME instruction;Functional mobility training;Therapeutic activities;Patient/family education   PT Goals Acute Rehab PT Goals PT Goal Formulation: With patient Time For Goal Achievement: 10/15/12 Potential to Achieve Goals: Good Pt will go Supine/Side to Sit: with modified independence PT Goal: Supine/Side to Sit - Progress: Goal set today Pt will go Sit to Supine/Side: with modified independence PT Goal: Sit to Supine/Side - Progress: Goal set today Pt will go Sit to Stand: with modified independence PT Goal: Sit to Stand - Progress: Goal set today Pt will go Stand to Sit: with modified independence PT Goal: Stand to Sit - Progress: Goal set today Pt will Transfer Bed to Chair/Chair to Bed: with modified independence PT Transfer Goal: Bed to Chair/Chair to Bed - Progress: Goal set today Additional Goals Additional Goal #1: pt. will adhere to NWB status with 100 % compliance PT Goal: Additional Goal #1 - Progress: Goal set today  Visit Information  Last PT Received On: 10/08/12 Assistance Needed: +2    Subjective Data  Subjective: Wants to get OOB  Patient Stated Goal: return to light housework   Prior [place]  Living Lives With: Spouse Available Help at Discharge: Available 24 hours/day;Family Type of Home: Mobile home Home Access: Stairs to enter Entrance Stairs-Number of Steps: 4 Entrance  Stairs-Rails: Right;Left Home Layout: One level Bathroom Shower/Tub: Tub/shower unit;Door [employer] Toilet: Handicapped height Bathroom Accessibility: Yes How Accessible: Accessible via walker Home Adaptive Equipment: Bedside commode/3-in-1;Walker - rolling;Wheelchair - Education administrator (comment) Prior Function Level of Independence: Independent Able to Take Stairs?: Yes Driving: No Vocation: Retired Corporate investment banker: No difficulties    Solicitor Arousal/Alertness: Awake/alert Behavior During Therapy: WFL for tasks assessed/performed Overall Cognitive Status: Within Functional Limits for tasks assessed    Extremity/Trunk Assessment Right Upper Extremity Assessment RUE ROM/Strength/Tone: WFL for tasks assessed RUE Sensation: WFL - Light Touch RUE Coordination: WFL - gross/fine motor Left Upper Extremity Assessment LUE ROM/Strength/Tone: WFL for tasks assessed LUE Sensation: WFL - Light Touch LUE Coordination: WFL - gross/fine motor Right Lower Extremity Assessment RLE ROM/Strength/Tone: WFL for tasks assessed RLE Sensation: WFL - Light Touch (pt. denies numbness/tingling, + diabetic) Left Lower Extremity Assessment LLE ROM/Strength/Tone: Unable to fully assess;Due to precautions (awaiting OR) LLE Sensation: WFL - Light Touch (denies numbness/tingling, + diabetic) Trunk Assessment Trunk Assessment: Normal   Balance Balance Balance Assessed: Yes Static Sitting Balance Static Sitting - Balance Support: No upper extremity supported;Feet supported Static Sitting - Level of Assistance: 6: Modified independent (Device/Increase time)  End of Session PT - End of Session Equipment Utilized During Treatment: Gait belt;Left knee immobilizer Activity Tolerance: Patient tolerated treatment well Patient left: in chair;with call bell/phone within reach Nurse Communication: Mobility status;Precautions;Weight bearing status  GP     Ladona Ridgel 10/08/2012, 2:54  PM Gerlean Ren PT Acute Rehab Services [phone] Bee[facility] [phone]

## 2012-10-09 ENCOUNTER — Encounter (HOSPITAL_COMMUNITY): Payer: Self-pay | Admitting: Anesthesiology

## 2012-10-09 ENCOUNTER — Encounter (HOSPITAL_COMMUNITY)
Admission: EM | Disposition: A | Discharge status: Home / Self Care | Payer: Self-pay | Source: Home / Self Care | Attending: Internal Medicine

## 2012-10-09 ENCOUNTER — Inpatient Hospital Stay (HOSPITAL_COMMUNITY): Payer: Medicare Other

## 2012-10-09 ENCOUNTER — Inpatient Hospital Stay (HOSPITAL_COMMUNITY): Payer: Medicare Other | Admitting: Anesthesiology

## 2012-10-09 DIAGNOSIS — S72499A Other fracture of lower end of unspecified femur, initial encounter for closed fracture: Secondary | ICD-10-CM | POA: Diagnosis not present

## 2012-10-09 DIAGNOSIS — IMO0002 Reserved for concepts with insufficient information to code with codable children: Secondary | ICD-10-CM | POA: Diagnosis not present

## 2012-10-09 DIAGNOSIS — D62 Acute posthemorrhagic anemia: Secondary | ICD-10-CM | POA: Diagnosis not present

## 2012-10-09 DIAGNOSIS — K219 Gastro-esophageal reflux disease without esophagitis: Secondary | ICD-10-CM | POA: Diagnosis not present

## 2012-10-09 DIAGNOSIS — E119 Type 2 diabetes mellitus without complications: Secondary | ICD-10-CM | POA: Diagnosis not present

## 2012-10-09 DIAGNOSIS — N390 Urinary tract infection, site not specified: Secondary | ICD-10-CM | POA: Diagnosis not present

## 2012-10-09 DIAGNOSIS — I10 Essential (primary) hypertension: Secondary | ICD-10-CM | POA: Diagnosis not present

## 2012-10-09 DIAGNOSIS — R7309 Other abnormal glucose: Secondary | ICD-10-CM

## 2012-10-09 DIAGNOSIS — S72453A Displaced supracondylar fracture without intracondylar extension of lower end of unspecified femur, initial encounter for closed fracture: Secondary | ICD-10-CM | POA: Diagnosis not present

## 2012-10-09 DIAGNOSIS — S72409A Unspecified fracture of lower end of unspecified femur, initial encounter for closed fracture: Secondary | ICD-10-CM

## 2012-10-09 DIAGNOSIS — E785 Hyperlipidemia, unspecified: Secondary | ICD-10-CM | POA: Diagnosis not present

## 2012-10-09 HISTORY — PX: ORIF FEMUR FRACTURE: SHX2119

## 2012-10-09 LAB — URINE CULTURE

## 2012-10-09 LAB — GLUCOSE, CAPILLARY
Glucose-Capillary: 81 mg/dL (ref 70–99)
Glucose-Capillary: 80 mg/dL (ref 70–99)

## 2012-10-09 LAB — BASIC METABOLIC PANEL
CO2: 22 mEq/L (ref 19–32)
Calcium: 7.7 mg/dL — ABNORMAL LOW (ref 8.4–10.5)
Chloride: 106 mEq/L (ref 96–112)
Sodium: 141 mEq/L (ref 135–145)

## 2012-10-09 LAB — VITAMIN D 25 HYDROXY (VIT D DEFICIENCY, FRACTURES): Vit D, 25-Hydroxy: 12 ng/mL — ABNORMAL LOW (ref 30–89)

## 2012-10-09 LAB — CBC
Platelets: 219 10*3/uL (ref 150–400)
RBC: 3.24 MIL/uL — ABNORMAL LOW (ref 3.87–5.11)
WBC: 16.8 10*3/uL — ABNORMAL HIGH (ref 4.0–10.5)

## 2012-10-09 SURGERY — OPEN REDUCTION INTERNAL FIXATION (ORIF) DISTAL FEMUR FRACTURE
Anesthesia: General | Site: Leg Upper | Laterality: Left | Wound class: Clean

## 2012-10-09 MED ORDER — EPHEDRINE SULFATE 50 MG/ML IJ SOLN
INTRAMUSCULAR | Status: DC | PRN
  Administered 2012-10-09: 15 mg via INTRAVENOUS

## 2012-10-09 MED ORDER — HYDROMORPHONE HCL PF 1 MG/ML IJ SOLN
0.2500 mg | INTRAMUSCULAR | Status: DC | PRN
  Administered 2012-10-09 (×2): 0.5 mg via INTRAVENOUS

## 2012-10-09 MED ORDER — SODIUM CHLORIDE 0.9 % IV SOLN
INTRAVENOUS | Status: DC | PRN
  Administered 2012-10-09 (×2): via INTRAVENOUS

## 2012-10-09 MED ORDER — LIDOCAINE HCL (CARDIAC) 20 MG/ML IV SOLN
INTRAVENOUS | Status: DC | PRN
  Administered 2012-10-09: 80 mg via INTRAVENOUS

## 2012-10-09 MED ORDER — ARTIFICIAL TEARS OP OINT
TOPICAL_OINTMENT | OPHTHALMIC | Status: DC | PRN
  Administered 2012-10-09: 1 via OPHTHALMIC

## 2012-10-09 MED ORDER — MIDAZOLAM HCL 5 MG/5ML IJ SOLN
INTRAMUSCULAR | Status: DC | PRN
  Administered 2012-10-09 (×2): 1 mg via INTRAVENOUS

## 2012-10-09 MED ORDER — SODIUM CHLORIDE 0.9 % IV SOLN
INTRAVENOUS | Status: DC
  Administered 2012-10-09: 16:00:00 via INTRAVENOUS

## 2012-10-09 MED ORDER — SUCCINYLCHOLINE CHLORIDE 20 MG/ML IJ SOLN
INTRAMUSCULAR | Status: DC | PRN
  Administered 2012-10-09: 100 mg via INTRAVENOUS

## 2012-10-09 MED ORDER — METHOCARBAMOL 100 MG/ML IJ SOLN
500.0000 mg | Freq: Four times a day (QID) | INTRAVENOUS | Status: DC | PRN
  Filled 2012-10-09: qty 10

## 2012-10-09 MED ORDER — GLUCOSE 40 % PO GEL
ORAL | Status: AC
  Administered 2012-10-09: 1 via ORAL
  Filled 2012-10-09: qty 1

## 2012-10-09 MED ORDER — HYDROMORPHONE HCL PF 1 MG/ML IJ SOLN
INTRAMUSCULAR | Status: DC | PRN
  Administered 2012-10-09 (×4): .25 mg via INTRAVENOUS

## 2012-10-09 MED ORDER — NEOSTIGMINE METHYLSULFATE 1 MG/ML IJ SOLN
INTRAMUSCULAR | Status: DC | PRN
  Administered 2012-10-09: 3 mg via INTRAVENOUS

## 2012-10-09 MED ORDER — PROMETHAZINE HCL 25 MG/ML IJ SOLN: 6.2500 mg | INTRAMUSCULAR | Status: DC | PRN

## 2012-10-09 MED ORDER — FENTANYL CITRATE 0.05 MG/ML IJ SOLN
INTRAMUSCULAR | Status: DC | PRN
  Administered 2012-10-09 (×2): 100 ug via INTRAVENOUS
  Administered 2012-10-09: 50 ug via INTRAVENOUS

## 2012-10-09 MED ORDER — ONDANSETRON HCL 4 MG/2ML IJ SOLN
INTRAMUSCULAR | Status: DC | PRN
  Administered 2012-10-09: 4 mg via INTRAVENOUS

## 2012-10-09 MED ORDER — ROCURONIUM BROMIDE 100 MG/10ML IV SOLN
INTRAVENOUS | Status: DC | PRN
  Administered 2012-10-09: 25 mg via INTRAVENOUS

## 2012-10-09 MED ORDER — ACETAMINOPHEN 10 MG/ML IV SOLN
INTRAVENOUS | Status: DC | PRN
  Administered 2012-10-09: 1000 mg via INTRAVENOUS

## 2012-10-09 MED ORDER — POTASSIUM CHLORIDE CRYS ER 20 MEQ PO TBCR
EXTENDED_RELEASE_TABLET | ORAL | Status: AC
  Filled 2012-10-09: qty 1

## 2012-10-09 MED ORDER — ENOXAPARIN SODIUM 40 MG/0.4ML ~~LOC~~ SOLN
40.0000 mg | SUBCUTANEOUS | Status: DC
  Administered 2012-10-10 – 2012-10-12 (×3): 40 mg via SUBCUTANEOUS
  Filled 2012-10-09 (×4): qty 0.4

## 2012-10-09 MED ORDER — GLYCOPYRROLATE 0.2 MG/ML IJ SOLN
INTRAMUSCULAR | Status: DC | PRN
  Administered 2012-10-09: 0.4 mg via INTRAVENOUS

## 2012-10-09 MED ORDER — PROPOFOL 10 MG/ML IV BOLUS
INTRAVENOUS | Status: DC | PRN
  Administered 2012-10-09: 150 mg via INTRAVENOUS

## 2012-10-09 SURGICAL SUPPLY — 63 items
BANDAGE ELASTIC 4 VELCRO ST LF (GAUZE/BANDAGES/DRESSINGS) ×2 IMPLANT
BANDAGE ELASTIC 6 VELCRO ST LF (GAUZE/BANDAGES/DRESSINGS) ×2 IMPLANT
BANDAGE GAUZE ELAST BULKY 4 IN (GAUZE/BANDAGES/DRESSINGS) ×2 IMPLANT
BIT DRILL GUIDEWIRE 2.5X200 (WIRE) ×12 IMPLANT
BLADE SURG ROTATE 9660 (MISCELLANEOUS) IMPLANT
BRUSH SCRUB DISP (MISCELLANEOUS) ×4 IMPLANT
CLOTH BEACON ORANGE TIMEOUT ST (SAFETY) ×2 IMPLANT
DRAPE C-ARM 42X72 X-RAY (DRAPES) ×2 IMPLANT
DRAPE C-ARMOR (DRAPES) ×2 IMPLANT
DRAPE ORTHO SPLIT 77X108 STRL (DRAPES) ×3
DRAPE SURG ORHT 6 SPLT 77X108 (DRAPES) ×3 IMPLANT
DRAPE U-SHAPE 47X51 STRL (DRAPES) ×4 IMPLANT
DRSG ADAPTIC 3X8 NADH LF (GAUZE/BANDAGES/DRESSINGS) ×2 IMPLANT
DRSG MEPILEX BORDER 4X8 (GAUZE/BANDAGES/DRESSINGS) ×2 IMPLANT
DRSG PAD ABDOMINAL 8X10 ST (GAUZE/BANDAGES/DRESSINGS) ×8 IMPLANT
ELECT REM PT RETURN 9FT ADLT (ELECTROSURGICAL) ×2
ELECTRODE REM PT RTRN 9FT ADLT (ELECTROSURGICAL) ×1 IMPLANT
EVACUATOR 1/8 PVC DRAIN (DRAIN) IMPLANT
EVACUATOR 3/16  PVC DRAIN (DRAIN)
EVACUATOR 3/16 PVC DRAIN (DRAIN) IMPLANT
GLOVE BIO SURGEON STRL SZ7.5 (GLOVE) ×4 IMPLANT
GLOVE BIO SURGEON STRL SZ8 (GLOVE) ×4 IMPLANT
GLOVE BIOGEL PI IND STRL 7.5 (GLOVE) ×2 IMPLANT
GLOVE BIOGEL PI IND STRL 8 (GLOVE) ×2 IMPLANT
GLOVE BIOGEL PI INDICATOR 7.5 (GLOVE) ×2
GLOVE BIOGEL PI INDICATOR 8 (GLOVE) ×2
GOWN PREVENTION PLUS XLARGE (GOWN DISPOSABLE) ×2 IMPLANT
GOWN STRL NON-REIN LRG LVL3 (GOWN DISPOSABLE) ×8 IMPLANT
KIT BASIN OR (CUSTOM PROCEDURE TRAY) ×2 IMPLANT
KIT ROOM TURNOVER OR (KITS) ×2 IMPLANT
MANIFOLD NEPTUNE II (INSTRUMENTS) ×2 IMPLANT
NEEDLE 22X1 1/2 (OR ONLY) (NEEDLE) ×2 IMPLANT
NS IRRIG 1000ML POUR BTL (IV SOLUTION) ×2 IMPLANT
PACK TOTAL JOINT (CUSTOM PROCEDURE TRAY) ×2 IMPLANT
PAD ARMBOARD 7.5X6 YLW CONV (MISCELLANEOUS) ×4 IMPLANT
PAD CAST 4YDX4 CTTN HI CHSV (CAST SUPPLIES) ×1 IMPLANT
PADDING CAST COTTON 4X4 STRL (CAST SUPPLIES) ×1
PADDING CAST COTTON 6X4 STRL (CAST SUPPLIES) ×2 IMPLANT
PLATE CONDYLAR LCP 8H LEFT (Plate) ×2 IMPLANT
SCREW 7.3 CANN CON PT THD 75 (Screw) ×2 IMPLANT
SCREW CANN 7.3 LK 75 (Screw) ×2 IMPLANT
SCREW CANN LOCK 5.0X65 (Screw) ×2 IMPLANT
SCREW CANN LOCK 5.0X70 (Screw) ×6 IMPLANT
SCREW CANN LOCK 5.0X75 (Screw) ×4 IMPLANT
SCREW CORTEX ST 4.5X34 (Screw) ×2 IMPLANT
SCREW CORTEX ST 4.5X36 (Screw) ×2 IMPLANT
SCREW LOCKING 4.0 34MM (Screw) ×4 IMPLANT
SPONGE GAUZE 4X4 12PLY (GAUZE/BANDAGES/DRESSINGS) ×2 IMPLANT
SPONGE LAP 18X18 X RAY DECT (DISPOSABLE) ×2 IMPLANT
STAPLER VISISTAT 35W (STAPLE) ×2 IMPLANT
SUCTION FRAZIER TIP 10 FR DISP (SUCTIONS) ×2 IMPLANT
SUT PROLENE 0 CT 2 (SUTURE) IMPLANT
SUT VIC AB 0 CT1 27 (SUTURE) ×2
SUT VIC AB 0 CT1 27XBRD ANBCTR (SUTURE) ×2 IMPLANT
SUT VIC AB 1 CT1 27 (SUTURE) ×2
SUT VIC AB 1 CT1 27XBRD ANBCTR (SUTURE) ×2 IMPLANT
SUT VIC AB 2-0 CT1 27 (SUTURE) ×2
SUT VIC AB 2-0 CT1 TAPERPNT 27 (SUTURE) ×2 IMPLANT
SYR 20ML ECCENTRIC (SYRINGE) IMPLANT
TOWEL OR 17X24 6PK STRL BLUE (TOWEL DISPOSABLE) ×2 IMPLANT
TOWEL OR 17X26 10 PK STRL BLUE (TOWEL DISPOSABLE) ×4 IMPLANT
TRAY FOLEY CATH 14FR (SET/KITS/TRAYS/PACK) IMPLANT
WATER STERILE IRR 1000ML POUR (IV SOLUTION) ×4 IMPLANT

## 2012-10-09 NOTE — Anesthesia Preprocedure Evaluation (Addendum)
Anesthesia Evaluation  Patient identified by MRN, date of birth, ID band Patient awake    Reviewed: Allergy & Precautions, H&P , NPO status , Patient's Chart, lab work & pertinent test results  History of Anesthesia Complications (+) DIFFICULT IV STICK / SPECIAL LINENegative for: history of anesthetic complications  Airway Mallampati: II TM Distance: >3 FB     Dental  (+) Poor Dentition, Dental Advisory Given and Teeth Intact   Pulmonary neg pulmonary ROS,    Pulmonary exam normal       Cardiovascular Exercise Tolerance: Poor hypertension, Pt. on medications and Pt. on home beta blockers + CAD Rhythm:Regular Rate:Normal  Nl ef echo 01/2011   Neuro/Psych PSYCHIATRIC DISORDERS Anxiety Depression negative neurological ROS     GI/Hepatic Neg liver ROS, GERD-  Medicated and Controlled,  Endo/Other  diabetes, Well Controlled, Type 2, Oral Hypoglycemic Agents  Renal/GU Renal InsufficiencyRenal disease     Musculoskeletal  (+) Arthritis -, Osteoarthritis,    Abdominal   Peds  Hematology negative hematology ROS (+)   Anesthesia Other Findings   Reproductive/Obstetrics                      Anesthesia Physical Anesthesia Plan  ASA: III  Anesthesia Plan: General   Post-op Pain Management:    Induction: Intravenous  Airway Management Planned: Oral ETT  Additional Equipment:   Intra-op Plan:   Post-operative Plan: Extubation in OR  Informed Consent: I have reviewed the patients History and Physical, chart, labs and discussed the procedure including the risks, benefits and alternatives for the proposed anesthesia with the patient or authorized representative who has indicated his/her understanding and acceptance.   Dental advisory given  Plan Discussed with: CRNA, Anesthesiologist and Surgeon  Anesthesia Plan Comments:         Anesthesia Quick Evaluation

## 2012-10-09 NOTE — Progress Notes (Signed)
[id number] cbg 48 glucose gel w sip juice given has been npo past mn for possible surgery,cbg 71 at 0716 report given to [facility], Caren Griffins D

## 2012-10-09 NOTE — Progress Notes (Signed)
Feeling well; denies paresthesias  LLE Sens DPN, SPN, TN intact  Motor EHL, ext, flex, evers 5/5  DP 2+, PT 2+  Hallux valgus, knee immobilizer  A: distal femur fracture, osteopenia, mildly symptomatic arthritis  PLAN: I have discussed this case at length and reviewed all imaging with total joint specialist, Dr. Adriana Mccallum, who requested specifically to weigh pros and cons of distal femoral replacement versus repair and later TKA.  Given her young age, 63, and minimally symptomatic arthritis, despite changes and osteopenia, we agreed that best course of action would be primary repair.  This would facilitate later TKA with ligament retention for a longer prosthetic life and function and maintain bone stock should she require revision later in life.  I have reviewed those findings with [name] who understands and agrees with the plan.   I discussed with the patient the risks and benefits of surgery, including the possibility of infection, nerve injury, vessel injury, wound breakdown, arthritis, symptomatic hardware, DVT/ PE, loss of motion, malunion, nonunion, and need for further surgery among others.  We also specifically discussed the potential for staged total knee arthroplasty.  She understood these risks and wished to proceed.  Altamese Xenia, MD [facility] [phone] (p)

## 2012-10-09 NOTE — Clinical Documentation Improvement (Signed)
Upon review of this pt's 5/17 notes, I do not see the documentation slurred speech, left sided weakness, and left facial droop"   Also in all the days I was attending she had no clinical evidence of CVA findings. No additional documentation Gayanne Prescott ViyuohMD TRH    GENERIC DOCUMENTATION CLARIFICATION QUERY  THIS DOCUMENT IS NOT A PERMANENT PART OF THE MEDICAL RECORD  TO RESPOND TO THE THIS QUERY, FOLLOW THE INSTRUCTIONS BELOW:  1. If needed, update documentation for the patient's encounter via the notes activity.  2. Access this query again and click edit on the In [employer].  3. After updating, or not, click F2 to complete all highlighted (required) fields concerning your review. Select "additional documentation in the medical record" OR "no additional documentation provided".  4. Click Sign note button.  5. The deficiency will fall out of your In Basket *Please let us know if you are not able to complete this workflow by phone or e-mail (listed below).  Please update your documentation within the medical record to reflect your response to this query.                                                                                        10/09/12   Dear Dr. Janice Norrie / Associates,  In a better effort to capture your patient's severity of illness, reflect appropriate length of stay and utilization of resources, a review of the patient medical record has revealed the following indicators.    Based on your clinical judgment, please clarify and document in a progress note and/or discharge summary the clinical condition associated with the following supporting information:  In responding to this query please exercise your independent judgment.  The fact that a query is asked, does not imply that any particular answer is desired or expected.  Per note  On 10/07/2012 documenting  the following symptoms "slurred speech, left sided weakness, and left facial droop"  Please clarify  clinical condition was present on admit  if possible. Thank you     Possible Clinical Conditions?  Acute Stroke  Aneurysm  TIA  Other Condition  Cannot Clinically Determine    You may use possible, probable, or suspect with inpatient documentation. possible, probable, suspected diagnoses MUST be documented at the time of discharge  Reviewed:  no additional documentation provided  Thank Ledell Noss  Clinical Documentation Specialist: Pager [phone]/ off [place]

## 2012-10-09 NOTE — Consult Note (Signed)
I have seen and examined the patient. I agree with the findings above.  67 y/o white female with complicated medical hx that sustained a fall while at home this evening. Pre-existing left knee arthritis, mild. No assistive devices.  No open wounds or lesions Distal femur TTP  Tibia, ankle and foot nontender, no instability  DPN, SPN, TN sensation intact EHL, FHL, AT, PT, peroneals intact 2+ DP pulse  Recommend left distal femoral replacement given pre-existing arthritis, comminuted fracture, and low demand.  This is clearly marginal, however, and I would like to discuss in detail and review images with total joint surgeon before proceeding.  Patient understands need for consultation.  Altamese , MD [facility] [phone] (p)

## 2012-10-09 NOTE — Anesthesia Postprocedure Evaluation (Signed)
  Anesthesia Post-op Note  Patient: [name]  Procedure(s) Performed: Procedure(s): OPEN REDUCTION INTERNAL FIXATION (ORIF) DISTAL FEMUR FRACTURE (Left)  Patient Location: PACU  Anesthesia Type:General  Level of Consciousness: awake, alert , oriented and patient cooperative  Airway and Oxygen Therapy: Patient Spontanous Breathing and Patient connected to nasal cannula oxygen  Post-op Pain: none  Post-op Assessment: Post-op Vital signs reviewed, Patient's Cardiovascular Status Stable, Respiratory Function Stable, Patent Airway, No signs of Nausea or vomiting and Pain level controlled  Post-op Vital Signs: Reviewed and stable  Complications: No apparent anesthesia complications

## 2012-10-09 NOTE — Anesthesia Procedure Notes (Signed)
Procedure Name: Intubation Date/Time: 10/09/2012 3:44 PM Performed by: Manuela Schwartz B Pre-anesthesia Checklist: Patient identified, Timeout performed, Emergency Drugs available, Suction available and Patient being monitored Patient Re-evaluated:Patient Re-evaluated prior to inductionOxygen Delivery Method: Circle system utilized Preoxygenation: Pre-oxygenation with 100% oxygen Intubation Type: IV induction Laryngoscope Size: Mac and 3 Grade View: Grade III Tube size: 7.0 mm Number of attempts: 1 Airway Equipment and Method: Video-laryngoscopy and Stylet Placement Confirmation: ETT inserted through vocal cords under direct vision,  breath sounds checked- equal and bilateral and positive ETCO2 Secured at: 21 cm Tube secured with: Tape Dental Injury: Teeth and Oropharynx as per pre-operative assessment  Difficulty Due To: Difficult Airway- due to reduced neck mobility, Difficulty was anticipated, Difficult Airway- due to anterior larynx and Difficult Airway- due to limited oral opening Future Recommendations: Recommend- induction with short-acting agent, and alternative techniques readily available Comments: Intubated by C. Evalette Montrose CRNA

## 2012-10-09 NOTE — Progress Notes (Signed)
Inpatient Diabetes Program Recommendations  AACE/ADA: New Consensus Statement on Inpatient Glycemic Control (2013)  Target Ranges:  Prepandial:   less than 140 mg/dL      Peak postprandial:   less than 180 mg/dL (1-2 hours)      Critically ill patients:  140 - 180 mg/dL  Results for ALYANNAH, HAUT (MRN [id number]) as of 10/09/2012 10:39  Ref. Range 10/08/2012 23:15 10/09/2012 00:00 10/09/2012 06:55 10/09/2012 07:16  Glucose-Capillary Latest Range: 70-99 mg/dL 59 (L) 113 (H) 45 (L) 71    Inpatient Diabetes Program Recommendations Correction (SSI): decrease to SENSITIVE scale  Oral Agents: discontinue Amaryl- Cr.=1.49 Thank you  Raoul Pitch BSN, RN,CDE Inpatient Diabetes Coordinator [phone] (team pager)

## 2012-10-09 NOTE — Progress Notes (Signed)
Physical Therapy Treatment Patient Details Name: [name] MRN: [id number] DOB: 1945-07-27 Today's Date: 10/09/2012 Time: [id number] PT Time Calculation (min): 16 min  PT Assessment / Plan / Recommendation Comments on Treatment Session  Pt slowly progressing with thearpy. Plans for surgery today. Will benefit from cont thearpy to maximize functional mobility and return to PLOF. Pt hopes to D/C home with husband once medically stable. Will follow progress. At this time recommend SNF.    Follow Up Recommendations  SNF;Supervision/Assistance - 24 hour;Supervision for mobility/OOB (vs. HHPT; pending WB status and rehab progress)     Does the patient have the potential to tolerate intense rehabilitation     Barriers to Discharge        Equipment Recommendations  None recommended by PT    Recommendations for Other Services    Frequency Min 6X/week   Plan Discharge plan remains appropriate;Frequency remains appropriate    Precautions / Restrictions Precautions Precautions: Fall Required Braces or Orthoses: Knee Immobilizer - Left (for comfort) Knee Immobilizer - Left: On at all times Restrictions Weight Bearing Restrictions: Yes LLE Weight Bearing: Non weight bearing   Pertinent Vitals/Pain 6/10; c/o mainly of "soreness" in L LE with activity.  Repositioned with L LE elevated for comfort.   Mobility  Bed Mobility Bed Mobility: Supine to Sit Supine to Sit: 4: Min assist;HOB elevated Details for Bed Mobility Assistance: assistance to advance L LE onto/off EOB Transfers Transfers: Sit to Stand;Stand to Sit;Stand Pivot Transfers Sit to Stand: 1: +2 Total assist;From elevated surface;From bed Sit to Stand: Patient Percentage: 80% Stand to Sit: 1: +2 Total assist;To chair/3-in-1;With armrests Stand to Sit: Patient Percentage: 80% Stand Pivot Transfers: 1: +2 Total assist;From elevated surface;With armrests Stand Pivot Transfers: Patient Percentage: 70% Details for Transfer  Assistance: multimodal cues for hand placement and sequencing; demo good ability to maintain NWB status on L LE. requires 2nd person assistance for safety due to balance deficits and fear of falling  Ambulation/Gait Ambulation/Gait Assistance: Not tested (comment) Stairs: No Wheelchair Mobility Wheelchair Mobility: No    Exercises General Exercises - Lower Extremity Ankle Circles/Pumps: AROM;10 reps;Both;Seated   PT Diagnosis:    PT Problem List:   PT Treatment Interventions:     PT Goals Acute Rehab PT Goals PT Goal Formulation: With patient Time For Goal Achievement: 10/15/12 Potential to Achieve Goals: Good PT Goal: Supine/Side to Sit - Progress: Progressing toward goal PT Goal: Sit to Stand - Progress: Progressing toward goal PT Goal: Stand to Sit - Progress: Progressing toward goal PT Transfer Goal: Bed to Chair/Chair to Bed - Progress: Progressing toward goal Additional Goals PT Goal: Additional Goal #1 - Progress: Progressing toward goal  Visit Information  Last PT Received On: 10/09/12 Assistance Needed: +2    Subjective Data  Subjective: agreeable to transfer to chair; husband present  Patient Stated Goal: "I would like to go home instead of reahb but i have 4 steps"   Cognition  Cognition Arousal/Alertness: Awake/alert Behavior During Therapy: WFL for tasks assessed/performed Overall Cognitive Status: Within Functional Limits for tasks assessed    Balance     End of Session PT - End of Session Equipment Utilized During Treatment: Gait belt;Left knee immobilizer Activity Tolerance: Patient tolerated treatment well Patient left: in chair;with call bell/phone within reach;with family/visitor present Nurse Communication: Mobility status   GP     Gustavus Bryant, [place] [phone] 10/09/2012, 1:54 PM

## 2012-10-09 NOTE — Preoperative (Addendum)
Beta Blockers   Toprol xl 50 mgs taken on 10-09-12 @ 0630

## 2012-10-09 NOTE — Brief Op Note (Signed)
10/06/2012 - 10/09/2012  6:00 PM  PATIENT:  [name]  67 y.o. female  PRE-OPERATIVE DIAGNOSIS:  Left Distal Femur Fx with intercondylar extension  POST-OPERATIVE DIAGNOSIS:  Left Distal Femur Fx  with intercondylar extension  PROCEDURE:  Procedure(s): OPEN REDUCTION INTERNAL FIXATION (ORIF) DISTAL FEMUR FRACTURE (Left) with Synthes distal femoral locking plate  SURGEON:  Surgeon(s) and Role:    * Rozanna Box, MD - Primary  PHYSICIAN ASSISTANT: Ainsley Spinner, [facility]  ANESTHESIA:   general  EBL:  Total I/O In: -  Out: 575 [Urine:550; Blood:25]  BLOOD ADMINISTERED:none  DRAINS: none   LOCAL MEDICATIONS USED:  NONE  SPECIMEN:  No Specimen  DISPOSITION OF SPECIMEN:  N/A  COUNTS:  YES  TOURNIQUET:  * No tourniquets in log *  DICTATION: .Other Dictation: Dictation Number [id number]  PLAN OF CARE: Admit to inpatient   PATIENT DISPOSITION:  PACU - hemodynamically stable.   Delay start of Pharmacological VTE agent (>24hrs) due to surgical blood loss or risk of bleeding: no

## 2012-10-09 NOTE — Progress Notes (Signed)
[facility] PROGRESS NOTE  [name] [id number] DOB: [birth year] DOA: 10/06/2012 PCP: Redge Gainer, MD  Assessment/Plan: 1. Closed Fracture of Left Distal Femur S/P Mechanical Fall-  -per ortho Handy, surgery today  -continue Pain Control with IV Dilaudid PRN.  2. Hyperglycemia/ DM2-  -continue amaryl, continue SSI coverage PRN.  3. HTN, uncontrolled -resume clonidine, metoprolol -follow and resume lasix when appropriate -on prn hydralazine, her pressure control better, follow. 4. CKD/CRI- -improving off lasix, follow.  5. Hyperlipidemia- Continue atorvastatin Simvastatin.  6.GNR UTI - Rocephin, follow culture ID and sens 7. DVT Prophylaxis      Code Status: full Family Communication:  Disposition Plan: to SNF post op   Consultants:  ortho  Procedures:  none  Antibiotics:  Rocephin- started on 5/18  HPI/Subjective:  on floor to see Patient-but gone to OR for surgery this afternoon, checked back and patient still in surgery on per PACU nurse sheduled to be there through about 7:45 pm Objective: Filed Vitals:   10/08/12 1743 10/08/12 2311 10/09/12 0601 10/09/12 1300  BP: 152/58 169/65 161/63 146/63  Pulse:  85 87 85  Temp:  98.4 F (36.9 C) 98.5 F (36.9 C) 99 F (37.2 C)  TempSrc:  Oral Oral   Resp:  16 15 16   SpO2:  99% 98% 98%    Intake/Output Summary (Last 24 hours) at 10/09/12 1652 Last data filed at 10/09/12 1600  Gross per 24 hour  Intake   1360 ml  Output   1150 ml  Net    210 ml   There were no vitals filed for this visit.  Data Reviewed: Basic Metabolic Panel:  Recent Labs Lab 10/06/12 1820 10/07/12 0545 10/08/12 0536 10/09/12 0610  NA 139 140 145 141  K 3.7 3.1* 3.1* 3.9  CL 99 101 105 106  CO2 25 28 24 22   GLUCOSE 541* 427* 228* 51*  BUN 30* 27* 19 22  CREATININE 2.03* 1.72* 1.33* 1.49*  CALCIUM 8.0* 7.9* 8.0* 7.7*   Liver Function Tests:  Recent Labs Lab 10/06/12 1820 10/07/12 0545  AST 21  --   ALT 7   --   ALKPHOS 178*  --   BILITOT 0.4  --   PROT 6.9  --   ALBUMIN 3.4* 3.2*   No results found for this basename: LIPASE, AMYLASE,  in the last 168 hours No results found for this basename: AMMONIA,  in the last 168 hours CBC:  Recent Labs Lab 10/06/12 1820 10/07/12 0545 10/08/12 0536 10/09/12 0610  WBC 9.2 11.9* 17.3* 16.8*  HGB 11.5* 9.8* 10.1* 9.1*  HCT 34.6* 29.5* 30.5* 28.3*  MCV 86.3 84.5 85.7 87.3  PLT 248 222 219 219   Cardiac Enzymes: No results found for this basename: CKTOTAL, CKMB, CKMBINDEX, TROPONINI,  in the last 168 hours BNP (last 3 results) No results found for this basename: PROBNP,  in the last 8760 hours CBG:  Recent Labs Lab 10/09/12 10/09/12 0655 10/09/12 0716 10/09/12 1103 10/09/12 1432  GLUCAP 113* 45* 71 82 81    Recent Results (from the past 240 hour(s))  URINE CULTURE     Status: None   Collection Time    10/06/12  6:43 PM      Result Value Range Status   Specimen Description URINE, RANDOM   Final   Special Requests NONE   Final   Culture  Setup Time 10/07/2012 01:32   Final   Colony Count >=100,000 COLONIES/ML   Final   Culture KLEBSIELLA  PNEUMONIAE   Final   Report Status 10/09/2012 FINAL   Final   Organism ID, Bacteria KLEBSIELLA PNEUMONIAE   Final  SURGICAL PCR SCREEN     Status: None   Collection Time    10/07/12  4:28 AM      Result Value Range Status   MRSA, PCR NEGATIVE  NEGATIVE Final   Staphylococcus aureus NEGATIVE  NEGATIVE Final   Comment:            The Xpert SA Assay (FDA     approved for NASAL specimens     in patients over 43 years of age),     is one component of     a comprehensive surveillance     program.  Test performance has     been validated by [employer] for patients greater     than or equal to 68 year old.     It is not intended     to diagnose infection nor to     guide or monitor treatment.     Studies: No results found.  Scheduled Meds: . calcitRIOL  0.25 mcg Oral Daily  .  cefTRIAXone (ROCEPHIN)  IV  1 g Intravenous Q24H  . cloNIDine  0.2 mg Oral BID  . docusate sodium  100 mg Oral BID  . glimepiride  2 mg Oral QAC breakfast  . insulin aspart  0-15 Units Subcutaneous TID WC  . insulin aspart  0-5 Units Subcutaneous QHS  . levothyroxine  75 mcg Oral QAC breakfast  . metoCLOPramide  5 mg Oral QID  . metoprolol succinate  50 mg Oral Daily  . pantoprazole (PROTONIX) IV  40 mg Intravenous BID AC   Continuous Infusions: . sodium chloride 20 mL/hr at 10/09/12 1530  . 0.9 % NaCl with KCl 20 mEq / L 100 mL/hr at 10/09/12 1304    Principal Problem:   Fracture of femur, distal, left, closed Active Problems:   DM   HYPERLIPIDEMIA   HYPERTENSION   RENAL INSUFFICIENCY   Fall at home   Hyperglycemia   UTI (urinary tract infection)        [facility] Pager [phone]. If 7PM-7AM, please contact night-coverage at [email or web address], password [facility] 10/09/2012, 4:52 PM  LOS: 3 days

## 2012-10-09 NOTE — Transfer of Care (Signed)
Immediate Anesthesia Transfer of Care Note  Patient: [name]  Procedure(s) Performed: Procedure(s): OPEN REDUCTION INTERNAL FIXATION (ORIF) DISTAL FEMUR FRACTURE (Left)  Patient Location: PACU  Anesthesia Type:General  Level of Consciousness: awake, alert , oriented and patient cooperative  Airway & Oxygen Therapy: Patient Spontanous Breathing and Patient connected to nasal cannula oxygen  Post-op Assessment: Report given to PACU RN and Post -op Vital signs reviewed and stable  Post vital signs: Reviewed and stable  Complications: No apparent anesthesia complications

## 2012-10-10 DIAGNOSIS — N390 Urinary tract infection, site not specified: Secondary | ICD-10-CM

## 2012-10-10 DIAGNOSIS — E119 Type 2 diabetes mellitus without complications: Secondary | ICD-10-CM | POA: Diagnosis not present

## 2012-10-10 DIAGNOSIS — E1139 Type 2 diabetes mellitus with other diabetic ophthalmic complication: Secondary | ICD-10-CM

## 2012-10-10 DIAGNOSIS — S7290XA Unspecified fracture of unspecified femur, initial encounter for closed fracture: Secondary | ICD-10-CM | POA: Diagnosis not present

## 2012-10-10 DIAGNOSIS — I10 Essential (primary) hypertension: Secondary | ICD-10-CM | POA: Diagnosis not present

## 2012-10-10 LAB — CBC
HCT: 26.3 % — ABNORMAL LOW (ref 36.0–46.0)
Hemoglobin: 8.3 g/dL — ABNORMAL LOW (ref 12.0–15.0)
MCH: 27.8 pg (ref 26.0–34.0)
MCHC: 31.6 g/dL (ref 30.0–36.0)
MCV: 88 fL (ref 78.0–100.0)
Platelets: 186 10*3/uL (ref 150–400)
RBC: 2.99 MIL/uL — ABNORMAL LOW (ref 3.87–5.11)
RDW: 14.7 % (ref 11.5–15.5)
WBC: 11.6 10*3/uL — ABNORMAL HIGH (ref 4.0–10.5)

## 2012-10-10 LAB — GLUCOSE, CAPILLARY
Glucose-Capillary: 97 mg/dL (ref 70–99)
Glucose-Capillary: 124 mg/dL — ABNORMAL HIGH (ref 70–99)

## 2012-10-10 LAB — BASIC METABOLIC PANEL
BUN: 21 mg/dL (ref 6–23)
CO2: 26 mEq/L (ref 19–32)
Chloride: 108 mEq/L (ref 96–112)
Creatinine, Ser: 1.33 mg/dL — ABNORMAL HIGH (ref 0.50–1.10)
GFR calc Af Amer: 47 mL/min — ABNORMAL LOW (ref >90)
Glucose, Bld: 116 mg/dL — ABNORMAL HIGH (ref 70–99)
Potassium: 4.5 mEq/L (ref 3.5–5.1)

## 2012-10-10 MED ORDER — CEFAZOLIN SODIUM 1-5 GM-% IV SOLN
1.0000 g | Freq: Three times a day (TID) | INTRAVENOUS | Status: AC
  Administered 2012-10-10 – 2012-10-11 (×3): 1 g via INTRAVENOUS
  Filled 2012-10-10 (×3): qty 50

## 2012-10-10 MED ORDER — PANTOPRAZOLE SODIUM 40 MG PO TBEC
40.0000 mg | DELAYED_RELEASE_TABLET | Freq: Two times a day (BID) | ORAL | Status: DC
  Administered 2012-10-10 – 2012-10-12 (×4): 40 mg via ORAL
  Filled 2012-10-10 (×4): qty 1

## 2012-10-10 MED ORDER — PNEUMOCOCCAL VAC POLYVALENT 25 MCG/0.5ML IJ INJ
0.5000 mL | INJECTION | Freq: Once | INTRAMUSCULAR | Status: AC
  Administered 2012-10-10: 0.5 mL via INTRAMUSCULAR
  Filled 2012-10-10: qty 0.5

## 2012-10-10 NOTE — Progress Notes (Addendum)
[facility]  PROGRESS NOTE  RICHARDINE EASTERWOOD [id number] DOB: 16-Dec-1945 DOA: 10/06/2012  PCP: Redge Gainer, MD  Assessment/Plan:  1. Closed Fracture of Left Distal Femur S/P Mechanical Fall-  -per ortho Handy, surgery 5/19- POD 1 -continue Pain Control with IV Dilaudid PRN.  -PT following 2. Hyperglycemia/ DM2-  -hypoglycemia overnight -but pt tolerating po better today -continue amaryl for now, continue SSI coverage PRN. -continue holding actos  3. HTN, uncontrolled  -resume clonidine, metoprolol  - resume lasix beginning in am  -on prn hydralazine, her pressure control better, follow.  4. CKD/CRI-  -improved, follow closely with lasix being resumed 5. Hyperlipidemia- Continue atorvastatin Simvastatin.  6.GNR UTI - contiue Rocephin, leukocytosis decreasing - follow   7. DVT Prophylaxis    Code Status: full  Family Communication:  Disposition Plan: to SNF post op  Consultants:  ortho Procedures:  none Antibiotics:  Rocephin- started on 5/18 HPI/Subjective:  She feels better today, decreased n/v- tolerating po better today Objective:  Filed Vitals:   10/10/12 2139  BP: 171/65  Pulse: 95  Temp: 100.1 F (37.8 C)  Resp: 18      Filed Vitals:    10/08/12 1743  10/08/12 2311  10/09/12 0601  10/09/12 1300   BP:  152/58  169/65  161/63  146/63   Pulse:   85  87  85   Temp:   98.4 F (36.9 C)  98.5 F (36.9 C)  99 F (37.2 C)   TempSrc:   Oral  Oral    Resp:   16  15  16    SpO2:   99%  98%  98%     Intake/Output Summary (Last 24 hours) at 10/09/12 1652 Last data filed at 10/09/12 1600   Gross per 24 hour   Intake  1360 ml   Output  1150 ml   Net  210 ml      Basic Metabolic Panel:    Component Value Date/Time   NA 143 10/10/2012 0500   K 4.5 10/10/2012 0500   CL 108 10/10/2012 0500   CO2 26 10/10/2012 0500   BUN 21 10/10/2012 0500   CREATININE 1.33* 10/10/2012 0500   CREATININE 1.70* 09/11/2012 1057   GLUCOSE 116* 10/10/2012 0500   CALCIUM 7.6*  10/10/2012 0500   CBC:    Component Value Date/Time   WBC 11.6* 10/10/2012 0500   HGB 8.3* 10/10/2012 0500   HCT 26.3* 10/10/2012 0500   PLT 186 10/10/2012 0500   MCV 88.0 10/10/2012 0500   NEUTROABS 16.3* 01/08/2011 2142   LYMPHSABS 1.1 01/08/2011 2142   MONOABS 0.8 01/08/2011 2142   EOSABS 0.0 01/08/2011 2142   BASOSABS 0.0 01/08/2011 2142      Basic Metabolic Panel:   Recent Labs  Lab  10/06/12 1820  10/07/12 0545  10/08/12 0536  10/09/12 0610   NA  139  140  145  141   K  3.7  3.1*  3.1*  3.9   CL  99  101  105  106   CO2  25  28  24  22    GLUCOSE  541*  427*  228*  51*   BUN  30*  27*  19  22   CREATININE  2.03*  1.72*  1.33*  1.49*   CALCIUM  8.0*  7.9*  8.0*  7.7*    Liver Function Tests:   Recent Labs  Lab  10/06/12 1820  10/07/12 0545   AST  21  --  ALT  7  --   ALKPHOS  178*  --   BILITOT  0.4  --   PROT  6.9  --   ALBUMIN  3.4*  3.2*    No results found for this basename: LIPASE, AMYLASE, in the last 168 hours  No results found for this basename: AMMONIA, in the last 168 hours  CBC:   Recent Labs  Lab  10/06/12 1820  10/07/12 0545  10/08/12 0536  10/09/12 0610   WBC  9.2  11.9*  17.3*  16.8*   HGB  11.5*  9.8*  10.1*  9.1*   HCT  34.6*  29.5*  30.5*  28.3*   MCV  86.3  84.5  85.7  87.3   PLT  248  222  219  219    Cardiac Enzymes:  No results found for this basename: CKTOTAL, CKMB, CKMBINDEX, TROPONINI, in the last 168 hours  BNP (last 3 results)  No results found for this basename: PROBNP, in the last 8760 hours  CBG:   Recent Labs  Lab  10/09/12  10/09/12 0655  10/09/12 0716  10/09/12 1103  10/09/12 1432   GLUCAP  113*  45*  71  82  81    Recent Results (from the past 240 hour(s))   URINE CULTURE Status: None    Collection Time    10/06/12 6:43 PM   Result  Value  Range  Status    Specimen Description  URINE, RANDOM   Final    Special Requests  NONE   Final    Culture Setup Time  10/07/2012 01:32   Final    Colony Count   >=100,000 COLONIES/ML   Final    Culture  KLEBSIELLA PNEUMONIAE   Final    Report Status  10/09/2012 FINAL   Final    Organism ID, Bacteria  KLEBSIELLA PNEUMONIAE   Final   SURGICAL PCR SCREEN Status: None    Collection Time    10/07/12 4:28 AM   Result  Value  Range  Status    MRSA, PCR  NEGATIVE  NEGATIVE  Final    Staphylococcus aureus  NEGATIVE  NEGATIVE  Final    Comment:      The Xpert SA Assay (FDA     approved for NASAL specimens     in patients over 78 years of age),     is one component of     a comprehensive surveillance     program. Test performance has     been validated by [employer] for patients greater     than or equal to 5 year old.     It is not intended     to diagnose infection nor to     guide or monitor treatment.    Studies:  No results found.  Scheduled Meds:  .  calcitRIOL  0.25 mcg  Oral  Daily   .  cefTRIAXone (ROCEPHIN) IV  1 g  Intravenous  Q24H   .  cloNIDine  0.2 mg  Oral  BID   .  docusate sodium  100 mg  Oral  BID   .  glimepiride  2 mg  Oral  QAC breakfast   .  insulin aspart  0-15 Units  Subcutaneous  TID WC   .  insulin aspart  0-5 Units  Subcutaneous  QHS   .  levothyroxine  75 mcg  Oral  QAC breakfast   .  metoCLOPramide  5 mg  Oral  QID   .  metoprolol succinate  50 mg  Oral  Daily   .  pantoprazole (PROTONIX) IV  40 mg  Intravenous  BID AC    Continuous Infusions:  .  sodium chloride  20 mL/hr at 10/09/12 1530   .  0.9 % NaCl with KCl 20 mEq / L  100 mL/hr at 10/09/12 1304    Principal Problem:  Fracture of femur, distal, left, closed  Active Problems:  DM  HYPERLIPIDEMIA  HYPERTENSION  RENAL INSUFFICIENCY  Fall at home  Hyperglycemia  UTI (urinary tract infection)   [facility]  Pager [phone]. If 7PM-7AM, please contact night-coverage at [email or web address], password [facility]    10/10/2012, 12:27 PM

## 2012-10-10 NOTE — Progress Notes (Signed)
UR COMPLETED  

## 2012-10-10 NOTE — Progress Notes (Signed)
Physical Therapy Treatment Patient Details Name: [name] MRN: [id number] DOB: 1946-02-27 Today's Date: 10/10/2012 Time: [id number] PT Time Calculation (min): 20 min  PT Assessment / Plan / Recommendation Comments on Treatment Session  Pt cont to present with deficits in mobility, transfers and balance due to NWB status on L LE and increased pain. Recommend SNF for ST rehab at this time due to assistance needed and steps at home.     Follow Up Recommendations  SNF;Supervision/Assistance - 24 hour;Supervision for mobility/OOB     Does the patient have the potential to tolerate intense rehabilitation     Barriers to Discharge        Equipment Recommendations  None recommended by PT    Recommendations for Other Services    Frequency Min 6X/week   Plan Discharge plan remains appropriate;Frequency remains appropriate    Precautions / Restrictions Precautions Precautions: Fall Precaution Comments: per RN; MD would like knee AROM/PROM today Required Braces or Orthoses: Knee Immobilizer - Left (for comfort) Knee Immobilizer - Left: On at all times Restrictions Weight Bearing Restrictions: Yes LLE Weight Bearing: Non weight bearing   Pertinent Vitals/Pain Denies pain at rest; 5/10 with movement; pt repositioned with L LE elevated with pillows.     Mobility  Bed Mobility Bed Mobility: Supine to Sit;Sitting - Scoot to Edge of Bed Supine to Sit: 3: Mod assist;HOB elevated;With rails Sitting - Scoot to Edge of Bed: 4: Min assist Details for Bed Mobility Assistance: assistance to advance L LE and trunk to upright postion on EOB; verbal cues for hand placement Transfers Transfers: Sit to Stand;Stand to Sit;Stand Pivot Transfers Sit to Stand: 1: +2 Total assist;From bed;From elevated surface Sit to Stand: Patient Percentage: 70% Stand to Sit: To chair/3-in-1;With armrests;With upper extremity assist;1: +2 Total assist Stand to Sit: Patient Percentage: 60% Stand Pivot Transfers:  1: +2 Total assist;From elevated surface;With armrests Stand Pivot Transfers: Patient Percentage: 50% Details for Transfer Assistance: pt very anxious with transfers; requires 2+ assistance to maintain balance and NWB status on L LE; required multimodal cues for sequencing and hand placement; pt unable to manuever/advance RW while maintaining balance due to NWB status on  L LE Ambulation/Gait Ambulation/Gait Assistance: Not tested (comment) Stairs: No Wheelchair Mobility Wheelchair Mobility: No    Exercises General Exercises - Lower Extremity Ankle Circles/Pumps: AROM;10 reps;Both;Seated Long Arc Quad: AAROM;Left;10 reps;Seated Heel Slides: AAROM;10 reps;Left;Seated Other Exercises Other Exercises: knee flexion with gravitiy assisted x10   PT Diagnosis:    PT Problem List:   PT Treatment Interventions:     PT Goals Acute Rehab PT Goals PT Goal Formulation: With patient Time For Goal Achievement: 10/15/12 Potential to Achieve Goals: Good PT Goal: Supine/Side to Sit - Progress: Progressing toward goal PT Goal: Sit to Stand - Progress: Progressing toward goal PT Goal: Stand to Sit - Progress: Progressing toward goal PT Transfer Goal: Bed to Chair/Chair to Bed - Progress: Progressing toward goal Additional Goals PT Goal: Additional Goal #1 - Progress: Progressing toward goal  Visit Information  Last PT Received On: 10/10/12 Assistance Needed: +2 (for safety when attempting amb)    Subjective Data  Subjective: agreeable to therapy; lying supine.; states "I wanted to go home but i just dont know" Patient Stated Goal: to move better   Cognition  Cognition Arousal/Alertness: Awake/alert Behavior During Therapy: WFL for tasks assessed/performed Overall Cognitive Status: Within Functional Limits for tasks assessed    Balance  Balance Balance Assessed: Yes Dynamic Standing Balance Dynamic Standing - Balance  Support: Bilateral upper extremity supported;During functional  activity Dynamic Standing - Level of Assistance: 2: Max assist  End of Session PT - End of Session Equipment Utilized During Treatment: Gait belt;Left knee immobilizer Activity Tolerance: Patient tolerated treatment well Patient left: in chair;with call bell/phone within reach Nurse Communication: Mobility status   GP     Gustavus Bryant, [place] 10/10/2012, 11:43 AM

## 2012-10-10 NOTE — Progress Notes (Signed)
Orthopaedic Trauma Service (OTS)  Subjective: 1 Day Post-Op Procedure(s) (LRB): OPEN REDUCTION INTERNAL FIXATION (ORIF) DISTAL FEMUR FRACTURE (Left) Patient reports pain as mild.   Feels well  Objective: Current Vitals Blood pressure 175/89, pulse 85, temperature 98.5 F (36.9 C), temperature source Oral, resp. rate 16, SpO2 100.00%. Vital signs in last 24 hours: Temp:  [97.9 F (36.6 C)-99 F (37.2 C)] 98.5 F (36.9 C) (05/20 0559) Pulse Rate:  [76-91] 85 (05/20 0559) Resp:  [11-21] 16 (05/20 0559) BP: (135-183)/(56-89) 175/89 mmHg (05/20 0559) SpO2:  [98 %-100 %] 100 % (05/20 0559)  Intake/Output from previous day: 05/19 0701 - 05/20 0700 In: 550 [I.V.:550] Out: 1200 [Urine:1150; Blood:50]  LABS  Recent Labs  10/08/12 0536 10/09/12 0610 10/10/12 0500  HGB 10.1* 9.1* 8.3*    Recent Labs  10/09/12 0610 10/10/12 0500  WBC 16.8* 11.6*  RBC 3.24* 2.99*  HCT 28.3* 26.3*  PLT 219 186    Recent Labs  10/09/12 0610 10/10/12 0500  NA 141 143  K 3.9 4.5  CL 106 108  CO2 22 26  BUN 22 21  CREATININE 1.49* 1.33*  GLUCOSE 51* 116*  CALCIUM 7.7* 7.6*   No results found for this basename: LABPT, INR,  in the last 72 hours   Physical Exam  Alert, no distress RRR No audible wheezing LLE Sens DPN, SPN, TN intact  Motor EHL, ext, flex, evers 5/5  DP 2+, PT 2+  drsg completely dry  Mild edema    Imaging Dg Femur Left  10/09/2012   *RADIOLOGY REPORT*  Clinical Data: Left distal femoral fracture  LEFT FEMUR - 2 VIEW  Comparison: Preoperative films of 10/06/2012  Findings: C-arm spot films show plate and screw fixation of the comminuted distal femoral fracture in good position.  IMPRESSION: Plate and screw fixation of comminuted distal femoral fracture.   Original Report Authenticated By: Ivar Drape, M.D.   Dg Knee Left Port  10/09/2012   *RADIOLOGY REPORT*  Clinical Data: Follow-up distal femur fracture.  PORTABLE LEFT KNEE - 1-2 VIEW  Comparison:  10/06/2012  Findings: Lateral fixation plate and screws are now seen transfixing a distal femoral metaphyseal fracture in near anatomic alignment.  Diffuse osteopenia noted.  The knee osteoarthritis also demonstrated.  IMPRESSION: Internal fixation of the distal femoral metaphyseal fracture in near anatomic alignment.   Original Report Authenticated By: Earle Gell, M.D.   Dg C-arm [id number] Min  10/09/2012   *RADIOLOGY REPORT*  Clinical Data: Fractured left femur  DG C-ARM 61-120 MIN  Comparison:  Left knee films of 10/06/2012  Findings: C-arm fluoroscopy was provided during pinning of the comminuted distal left femoral fracture.  IMPRESSION: C-arm fluoroscopy provided.   Original Report Authenticated By: Ivar Drape, M.D.    Assessment/Plan: 1 Day Post-Op Procedure(s) (LRB): OPEN REDUCTION INTERNAL FIXATION (ORIF) DISTAL FEMUR FRACTURE (Left) 1. PT AROM, PROM, NWB x 8 weeks; ice; no bracing to facilitate aggressive motion (cautioined regarding flexion contracture given slight preop contracture). 2. Lovenox  3. D/c planning  [place], MD [facility] [phone] (p)   10/10/2012, 9:00 AM

## 2012-10-10 NOTE — Op Note (Signed)
NAMEBISHOP, HILLIGOSS               ACCOUNT NO.:  [id number]  MEDICAL RECORD NO.:  [id number]  LOCATION:  [id number]                        FACILITY:  [facility]  PHYSICIAN:  Astrid Divine. Marcelino Scot, M.D. DATE OF BIRTH:  [birth year]  DATE OF PROCEDURE:  10/09/2012 DATE OF DISCHARGE:                              OPERATIVE REPORT   PREOPERATIVE DIAGNOSIS:  Left supracondylar distal femur fracture with intercondylar extension including a lateral Hoffa fragment.  POSTOPERATIVE DIAGNOSIS:  Left supracondylar distal femur fracture with intercondylar extension including a lateral Hoffa fragment.  PROCEDURE:  ORIF of left distal femur fracture with intercondylar extension using a [employer]ynthes 8-hole distal femoral locking plate.  SURGEON:  Astrid Divine. Marcelino Scot, M.D.  ASSISTANT:  Jari Pigg, [place]  ANESTHESIA:  General.  COMPLICATIONS:  None.  ESTIMATED BLOOD LOSS:  Less than 100 mL.  TOURNIQUET:  None.  DISPOSITION:  To PACU.  CONDITION:  Stable.  BRIEF SUMMARY OF INDICATION FOR PROCEDURE:  [name] is a very pleasant 67 year old female who sustained a ground level fall resulting in a comminuted intra-articular left distal femur fracture. Preoperatively, we spent several days in a consultation and review with our total joint colleagues regarding the advisability of ORIF versus distal femoral replacement given some mild pre-existing symptoms attributable to arthritis, her osteopenia and low demand.  After this extensive review, we mutually concluded that the best course of treatment would be for repair in hopes to return into her previous level of activity, but with the understanding that total joint replacement using a standard prosthesis would be one option within 6 months or so of repair should symptoms progress.  This would preserve the collaterals resulting in a more functional prosthetic and present more options in the event of need for revision given her young age at 89.  I did discuss this,  a set of options in detail with the patient who voiced understanding and agreement with the plan.  Furthermore, we discussed the risk of the procedure, which included nonunion, malunion, arthritis, and progression of arthritis, DVT, PE, loss of motion, need for further surgery, particularly total knee arthroplasty, nerve injury, vessel injury, infection, anesthetic complications including heart attack, stroke, and many others.  The patient did wish to proceed.  BRIEF DESCRIPTION OF PROCEDURE:  [name] did receive preop antibiotics, taken to the operating room and anesthesia was induced. Her left lower extremity was prepped and draped in the usual fashion. No tourniquet was used during the procedure.  C-arm was brought in after reducing the fracture close with the help of the radiolucent triangle. Appropriate starting position and plate size was then established.  A 4 cm linear incision was made about the distal femoral block laterally and then extended another centimeter distally to facilitate plate placement, arthrotomy was performed.  Hemarthrosis was evacuated.  The joint was palpated and evaluated.  There was some chondrocalcinosis visible at the distal end of the excision involving the meniscus.  There was a coronal Hoffa fragment involving the trochlea and also a displaced intercondylar sagittal split along the medial femoral condyle.  These were lined up and pinned provisionally with a 0.062 K-wires and then the plate affixed to the side and secured with  the centrally threaded pin making sure that our alignment was appropriate.  This was followed by placing the Erma Pinto clamp to assist with the reduction apposition of the bony end.  We placed a series of drill tipped guidewires into the distal femoral block and then one screw proximally through a stab incision.  We sequentially replaced the distal block with a partially threaded conical screw to maximally oppose the fracture  site and the plate to the bone.  This was followed by placement of all the 5-0 distal femoral locking screws and then 2 bicortical screws in the most proximal hole and then the 4th most proximal hole and then 2 locked screws in between those using the 4-0 to minimize the size of the hole in the bone with the expectation that she will ultimately have this removed and not to have a stress riser there or to minimize its occurrence.  Final images showed appropriate reduction and hardware placement, trajectory and length.  Ainsley Spinner, [facility] did assist me throughout the procedure.  He did assist with maintenance of reduction and status __________ and while I placed provisional fixation, also assisted with some placement of provisional and definitive fixation and wound closure, and the case could not have been completed without an Environmental consultant.  The patient was then placed into a sterile gently compressive dressing from foot to thigh after standard layered closure using #1 Vicryl, 0 Vicryl, 2-0 Vicryl, and 3-0 nylon.  PROGNOSIS:  Ms. Elser will be nonweightbearing with unrestricted range of motion of the knee and hip.  I have contacted Dr. Paulo Fruit who will assist with her followup beyond 3 months as appropriate.  She will be on DVT prophylaxis pharmacologically when in the hospital and likely discharge on a 10-day course.  She may require short-term rehab or SNF placement to assist with mobility and return to function as well.     Astrid Divine. Marcelino Scot, M.D.     MHH/MEDQ  D:  10/09/2012  T:  10/10/2012  Job:  [id number]

## 2012-10-11 DIAGNOSIS — F411 Generalized anxiety disorder: Secondary | ICD-10-CM

## 2012-10-11 DIAGNOSIS — N259 Disorder resulting from impaired renal tubular function, unspecified: Secondary | ICD-10-CM

## 2012-10-11 DIAGNOSIS — S72499A Other fracture of lower end of unspecified femur, initial encounter for closed fracture: Secondary | ICD-10-CM | POA: Diagnosis not present

## 2012-10-11 DIAGNOSIS — R7309 Other abnormal glucose: Secondary | ICD-10-CM | POA: Diagnosis not present

## 2012-10-11 DIAGNOSIS — S7290XA Unspecified fracture of unspecified femur, initial encounter for closed fracture: Secondary | ICD-10-CM | POA: Diagnosis not present

## 2012-10-11 LAB — BASIC METABOLIC PANEL
BUN: 17 mg/dL (ref 6–23)
CO2: 23 mEq/L (ref 19–32)
Calcium: 6.9 mg/dL — ABNORMAL LOW (ref 8.4–10.5)
Creatinine, Ser: 1.29 mg/dL — ABNORMAL HIGH (ref 0.50–1.10)

## 2012-10-11 LAB — GLUCOSE, CAPILLARY
Glucose-Capillary: 135 mg/dL — ABNORMAL HIGH (ref 70–99)
Glucose-Capillary: 94 mg/dL (ref 70–99)
Glucose-Capillary: 159 mg/dL — ABNORMAL HIGH (ref 70–99)

## 2012-10-11 LAB — VITAMIN D 1,25 DIHYDROXY
Vitamin D 1, 25 (OH)2 Total: 36 pg/mL (ref 18–72)
Vitamin D2 1, 25 (OH)2: <8 pg/mL
Vitamin D3 1, 25 (OH)2: 36 pg/mL

## 2012-10-11 LAB — CBC
HCT: 22.7 % — ABNORMAL LOW (ref 36.0–46.0)
MCH: 28 pg (ref 26.0–34.0)
MCV: 87 fL (ref 78.0–100.0)
Platelets: 158 10*3/uL (ref 150–400)
RBC: 2.61 MIL/uL — ABNORMAL LOW (ref 3.87–5.11)
RDW: 14.5 % (ref 11.5–15.5)

## 2012-10-11 LAB — PREPARE RBC (CROSSMATCH)

## 2012-10-11 MED ORDER — WARFARIN VIDEO: Freq: Once | Status: DC

## 2012-10-11 MED ORDER — FUROSEMIDE 10 MG/ML IJ SOLN
20.0000 mg | Freq: Once | INTRAMUSCULAR | Status: AC
  Administered 2012-10-11: 20 mg via INTRAVENOUS
  Filled 2012-10-11: qty 2

## 2012-10-11 MED ORDER — PATIENT'S GUIDE TO USING COUMADIN BOOK
Freq: Once | Status: AC
  Administered 2012-10-11: 14:00:00
  Filled 2012-10-11: qty 1

## 2012-10-11 MED ORDER — ACETAMINOPHEN 325 MG PO TABS
650.0000 mg | ORAL_TABLET | Freq: Once | ORAL | Status: AC
  Administered 2012-10-11: 650 mg via ORAL

## 2012-10-11 MED ORDER — POLYETHYLENE GLYCOL 3350 17 G PO PACK
17.0000 g | PACK | Freq: Two times a day (BID) | ORAL | Status: DC
  Administered 2012-10-11 – 2012-10-12 (×3): 17 g via ORAL
  Filled 2012-10-11 (×5): qty 1

## 2012-10-11 MED ORDER — FLEET ENEMA 7-19 GM/118ML RE ENEM: 1.0000 | ENEMA | Freq: Once | RECTAL | Status: DC

## 2012-10-11 MED ORDER — CEFAZOLIN SODIUM 1-5 GM-% IV SOLN
1.0000 g | Freq: Three times a day (TID) | INTRAVENOUS | Status: DC
  Administered 2012-10-11 – 2012-10-12 (×3): 1 g via INTRAVENOUS
  Filled 2012-10-11 (×6): qty 50

## 2012-10-11 MED ORDER — WARFARIN - PHARMACIST DOSING INPATIENT: Freq: Every day | Status: DC

## 2012-10-11 MED ORDER — DIPHENHYDRAMINE HCL 25 MG PO CAPS
25.0000 mg | ORAL_CAPSULE | Freq: Once | ORAL | Status: AC
  Administered 2012-10-11: 25 mg via ORAL
  Filled 2012-10-11: qty 1

## 2012-10-11 MED ORDER — BISACODYL 10 MG RE SUPP
10.0000 mg | Freq: Every day | RECTAL | Status: DC | PRN
  Administered 2012-10-11 – 2012-10-12 (×2): 10 mg via RECTAL
  Filled 2012-10-11 (×2): qty 1

## 2012-10-11 MED ORDER — ADULT MULTIVITAMIN W/MINERALS CH
1.0000 | ORAL_TABLET | Freq: Every day | ORAL | Status: DC
  Administered 2012-10-11 – 2012-10-12 (×2): 1 via ORAL
  Filled 2012-10-11 (×3): qty 1

## 2012-10-11 MED ORDER — WARFARIN SODIUM 5 MG PO TABS
5.0000 mg | ORAL_TABLET | Freq: Once | ORAL | Status: AC
  Administered 2012-10-11: 5 mg via ORAL
  Filled 2012-10-11: qty 1

## 2012-10-11 MED ORDER — FUROSEMIDE 10 MG/ML IJ SOLN: 20.0000 mg | Freq: Once | INTRAMUSCULAR | Status: DC

## 2012-10-11 NOTE — Progress Notes (Signed)
ANTICOAGULATION CONSULT NOTE - Initial Consult  Pharmacy Consult for Coumadin Indication: VTE prophylaxis  Allergies  Allergen Reactions  . Aspirin     vomiting  . Ciprofloxacin     unknown  . Codeine     Makes me sick   . Micardis (Telmisartan)     unknown  . Rofecoxib     unknown    Patient Measurements:  Wt: 82.6 kg as of 09/11/12  Vital Signs: Temp: 99.3 F (37.4 C) (05/21 1240) Temp src: Oral (05/21 1240) BP: 116/53 mmHg (05/21 1240) Pulse Rate: 81 (05/21 1240)  Labs:  Recent Labs  10/09/12 0610 10/10/12 0500 10/11/12 0532  HGB 9.1* 8.3* 7.3*  HCT 28.3* 26.3* 22.7*  PLT 219 186 158  CREATININE 1.49* 1.33* 1.29*    The CrCl is unknown because both a height and weight (above a minimum accepted value) are required for this calculation.   Medical History: Past Medical History  Diagnosis Date  . Hypertension   . Hyperlipidemia   . Thyroid disease     hypothryoidism  . GERD (gastroesophageal reflux disease)   . GAD (generalized anxiety disorder)   . GIB (gastrointestinal bleeding)   . Diabetes mellitus   . CKD (chronic kidney disease), stage III   . Peripheral edema   . Anemia   . Colon polyps   . Coronary artery disease     Medications:  Prescriptions prior to admission  Medication Sig Dispense Refill  . atorvastatin (LIPITOR) 40 MG tablet Take 1 tablet (40 mg total) by mouth daily.  90 tablet  1  . calcitRIOL (ROCALTROL) 0.25 MCG capsule Take 0.25 mcg by mouth daily.       . cloNIDine (CATAPRES) 0.2 MG tablet Take 1 tablet (0.2 mg total) by mouth 2 (two) times daily.  90 tablet  1  . furosemide (LASIX) 40 MG tablet Take 40 mg by mouth daily.       Marland Kitchen glimepiride (AMARYL) 2 MG tablet Take 2 mg by mouth daily before breakfast.      . levothyroxine (SYNTHROID, LEVOTHROID) 75 MCG tablet Take 1 tablet (75 mcg total) by mouth daily before breakfast.  90 tablet  1  . losartan (COZAAR) 100 MG tablet Take 100 mg by mouth daily.        . metoCLOPramide  (REGLAN) 5 MG tablet Take 1 tablet (5 mg total) by mouth 4 (four) times daily.  360 tablet  1  . metoprolol (TOPROL-XL) 50 MG 24 hr tablet Take 50 mg by mouth daily.        . pantoprazole (PROTONIX) 40 MG tablet Take 1 tablet (40 mg total) by mouth 2 (two) times daily.  180 tablet  1  . pioglitazone (ACTOS) 30 MG tablet Take 1 tablet (30 mg total) by mouth daily.  90 tablet  1    Assessment: 67 yo F admitted after falling>>dstal L femur fx, now POD#2 s/p distal L femur fx.  On lovenox 40 mg sq q24h - last dose given 0821 today.  Last INR 1.2 on 5/17.  Goal of Therapy:  INR 2-3 Monitor platelets by anticoagulation protocol: Yes   Plan:  - Coumadin 5 mg po x1 - Daily INR - Initiate Coumadin education with book/video  Bryson Ha L. Amada Jupiter, PharmD, [place] Clinical Pharmacist Pager: [phone] Pharmacy: [phone] 10/11/2012 1:13 PM

## 2012-10-11 NOTE — Progress Notes (Signed)
Patient ID: [name]  female  [id number]    DOB: January 08, 1946    DOA: 10/06/2012  PCP: Redge Gainer, MD  Assessment/Plan: Principal Problem:   Fracture of femur, distal, left, closed due to fall - Postop day #2, status post ORIF - Orthopedics following, pain controlled - Will likely need skilled nursing facility, patient prefers to go to nursing facility for rehabilitation.  Active Problems:   DM - Continue sliding scale insulin  Anemia: Likely secondary to anemia of blood loss, postop. - Transfused today, hemoglobin was 7.3, Will recheck H&H    HYPERLIPIDEMIA: Continue statins    HYPERTENSION - Continue clonidine, metoprolol    RENAL INSUFFICIENCY- improving     Klebsiella pneumonia UTI (urinary tract infection) -Continue Keflex  DVT Prophylaxis: Lovenox   Code Status:  [place], hopefully tomorrow if bed available    Subjective: No specific complaints at this time, pain controlled   Objective: Weight change:   Intake/Output Summary (Last 24 hours) at 10/11/12 1257 Last data filed at 10/11/12 1146  Gross per 24 hour  Intake  262.5 ml  Output    850 ml  Net -587.5 ml   Blood pressure 116/53, pulse 81, temperature 99.3 F (37.4 C), temperature source Oral, resp. rate 15, SpO2 97.00%.  Physical Exam: General: Alert and awake, oriented x3, not in any acute distress. CVS: S1-S2 clear, no murmur rubs or gallops Chest: clear to auscultation bilaterally, no wheezing, rales or rhonchi Abdomen: soft nontender, nondistended, normal bowel sounds  Extremities: no cyanosis, clubbing. Trace edema noted bilaterally   Lab Results: Basic Metabolic Panel:  Recent Labs Lab 10/10/12 0500 10/11/12 0532  NA 143 139  K 4.5 4.2  CL 108 106  CO2 26 23  GLUCOSE 116* 116*  BUN 21 17  CREATININE 1.33* 1.29*  CALCIUM 7.6* 6.9*   Liver Function Tests:  Recent Labs Lab 10/06/12 1820 10/07/12 0545  AST 21  --   ALT 7  --   ALKPHOS  178*  --   BILITOT 0.4  --   PROT 6.9  --   ALBUMIN 3.4* 3.2*   No results found for this basename: LIPASE, AMYLASE,  in the last 168 hours No results found for this basename: AMMONIA,  in the last 168 hours CBC:  Recent Labs Lab 10/10/12 0500 10/11/12 0532  WBC 11.6* 10.7*  HGB 8.3* 7.3*  HCT 26.3* 22.7*  MCV 88.0 87.0  PLT 186 158   Cardiac Enzymes: No results found for this basename: CKTOTAL, CKMB, CKMBINDEX, TROPONINI,  in the last 168 hours BNP: No components found with this basename: POCBNP,  CBG:  Recent Labs Lab 10/10/12 1105 10/10/12 1554 10/10/12 2139 10/11/12 0650 10/11/12 1121  GLUCAP 97 197* 124* 135* 94     Micro Results: Recent Results (from the past 240 hour(s))  URINE CULTURE     Status: None   Collection Time    10/06/12  6:43 PM      Result Value Range Status   Specimen Description URINE, RANDOM   Final   Special Requests NONE   Final   Culture  Setup Time 10/07/2012 01:32   Final   Colony Count >=100,000 COLONIES/ML   Final   Culture KLEBSIELLA PNEUMONIAE   Final   Report Status 10/09/2012 FINAL   Final   Organism ID, Bacteria KLEBSIELLA PNEUMONIAE   Final  SURGICAL PCR SCREEN     Status: None   Collection Time    10/07/12  4:28 AM  Result Value Range Status   MRSA, PCR NEGATIVE  NEGATIVE Final   Staphylococcus aureus NEGATIVE  NEGATIVE Final   Comment:            The Xpert SA Assay (FDA     approved for NASAL specimens     in patients over 30 years of age),     is one component of     a comprehensive surveillance     program.  Test performance has     been validated by [employer] for patients greater     than or equal to 36 year old.     It is not intended     to diagnose infection nor to     guide or monitor treatment.    Studies/Results: Dg Chest 2 View  10/06/2012   *RADIOLOGY REPORT*  Clinical Data: Status post fall.  Knee pain  CHEST - 2 VIEW  Comparison: 01/08/2011  Findings: Heart size is normal.  There is no  pleural effusion or edema.  Asymmetric elevation of the right hemidiaphragm noted.  There is no airspace consolidation identified.  Review of the visualized osseous structures is unremarkable.  IMPRESSION:  1.  No acute cardiopulmonary abnormalities. 2.  Asymmetric elevation of the right hemidiaphragm.   Original Report Authenticated By: Kerby Moors, M.D.   Dg Femur Left  10/09/2012   *RADIOLOGY REPORT*  Clinical Data: Left distal femoral fracture  LEFT FEMUR - 2 VIEW  Comparison: Preoperative films of 10/06/2012  Findings: C-arm spot films show plate and screw fixation of the comminuted distal femoral fracture in good position.  IMPRESSION: Plate and screw fixation of comminuted distal femoral fracture.   Original Report Authenticated By: Ivar Drape, M.D.   Ct Knee Left Wo Contrast  10/06/2012   *RADIOLOGY REPORT*  Clinical Data: Femur fracture.  Fall.  CT OF THE LEFT KNEE WITHOUT CONTRAST  Technique:  Multidetector CT imaging was performed according to the standard protocol. Multiplanar CT image reconstructions were also generated.  Comparison: 10/06/2012  Findings: Comminuted distal femoral fracture extends from the distal metadiaphysis to the intercondylar notch and articular surfaces of the lateral femoral condyle.  Several prior fracture planes extend into the lateral femoral trochlear groove and central intercondylar notch  No significantly depressed cortical fragments extend into the comminuted fracture planes.  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  Chronically fragmented osteophytes along the superior patellar margin.  Hemarthrosis noted.  Suspected lipohemarthrosis and in the Baker's cyst.  Amorphous calcification along the expected tract of the popliteus tendon.  Irregular amorphous calcification along the distal iliotibial band.  IMPRESSION:  1.  Prominently comminuted distal femoral fracture extending from the metaphysis to the intercondylar notch  and lateral condylar articular surface. No cortical fragments embedded within the fracture planes.  2.  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   Original Report Authenticated By: Van Clines, M.D.   Dg Knee Complete 4 Views Left  10/06/2012   *RADIOLOGY REPORT*  Clinical Data: Fall with pain and swelling.  LEFT KNEE - COMPLETE 4+ VIEW  Comparison: None.  Findings: Vascular calcifications.  Three compartment underlying osteoarthritis.  Comminuted, intra-articular fracture of the distal femur.  Fracture fragment extends out the medial aspect of the supracondylar region and extends distally into the intercondylar region of the femur.  A bone fragment projecting proximal to the patella on the lateral view may have arisen from the distal femur. No definite source within the patella.  Moderate suprapatellar joint effusion.  IMPRESSION: Intra-articular fracture of the distal femur with probable comminution and concurrent suprapatellar joint effusion.  Consider CT.  Underlying osteopenia.  Advanced vascular calcifications.  Likely related to diabetes.   Original Report Authenticated By: Abigail Miyamoto, M.D.   Dg Knee Left Port  10/09/2012   *RADIOLOGY REPORT*  Clinical Data: Follow-up distal femur fracture.  PORTABLE LEFT KNEE - 1-2 VIEW  Comparison: 10/06/2012  Findings: Lateral fixation plate and screws are now seen transfixing a distal femoral metaphyseal fracture in near anatomic alignment.  Diffuse osteopenia noted.  The knee osteoarthritis also demonstrated.  IMPRESSION: Internal fixation of the distal femoral metaphyseal fracture in near anatomic alignment.   Original Report Authenticated By: Earle Gell, M.D.   Dg C-arm [phone] Min  10/09/2012   *RADIOLOGY REPORT*  Clinical Data: Fractured left femur  DG C-ARM 61-120 MIN  Comparison:  Left knee films of 10/06/2012  Findings: C-arm fluoroscopy was provided during pinning of the comminuted distal left femoral fracture.  IMPRESSION: C-arm fluoroscopy provided.    Original Report Authenticated By: Ivar Drape, M.D.    Medications: Scheduled Meds: . calcitRIOL  0.25 mcg Oral Daily  .  ceFAZolin (ANCEF) IV  1 g Intravenous Q8H  . cloNIDine  0.2 mg Oral BID  . docusate sodium  100 mg Oral BID  . enoxaparin (LOVENOX) injection  40 mg Subcutaneous Q24H  . furosemide  20 mg Intravenous Once  . glimepiride  2 mg Oral QAC breakfast  . insulin aspart  0-15 Units Subcutaneous TID WC  . insulin aspart  0-5 Units Subcutaneous QHS  . levothyroxine  75 mcg Oral QAC breakfast  . metoCLOPramide  5 mg Oral QID  . metoprolol succinate  50 mg Oral Daily  . multivitamin with minerals  1 tablet Oral Daily  . pantoprazole  40 mg Oral BID AC  . polyethylene glycol  17 g Oral BID  . sodium phosphate  1 enema Rectal Once      LOS: 5 days   RAI,RIPUDEEP M.D. [facility] 10/11/2012, 12:57 PM Pager: [phone]  If 7PM-7AM, please contact night-coverage [email or web address] Password [email or web address]

## 2012-10-11 NOTE — Progress Notes (Signed)
[facility] Progress Note     2 Days Post-Op  Subjective   Doing ok Pain controlled No BM + flatus No reported N/V Better PO intake  Per nursing and tech reports pt is moving around very little  Objective  BP 144/52  Pulse 103  Temp(Src) 98.9 F (37.2 C) (Oral)  Resp 16  SpO2 97%  Patient Vitals for the past 24 hrs:  BP Temp Temp src Pulse Resp SpO2  10/11/12 0519 144/52 mmHg 98.9 F (37.2 C) - 103 16 97 %  10/10/12 2139 171/65 mmHg 100.1 F (37.8 C) Oral 95 18 95 %  10/10/12 1400 143/64 mmHg 100 F (37.8 C) - 109 16 100 %    Intake/Output     05/20 0701 - 05/21 0700 05/21 0701 - 05/22 0700   I.V.     Total Intake       Urine 50    Blood     Total Output 50     Net -50          Urine Occurrence 3 x      Labs Results for [name], [name] (MRN [id number]) as of 10/11/2012 08:50  Ref. Range 10/11/2012 05:32  Sodium Latest Range: 135-145 mEq/L 139  Potassium Latest Range: 3.5-5.1 mEq/L 4.2  Chloride Latest Range: 96-112 mEq/L 106  CO2 Latest Range: 19-32 mEq/L 23  BUN Latest Range: 6-23 mg/dL 17  Creatinine Latest Range: 0.50-1.10 mg/dL 1.29 (H)  Calcium Latest Range: 8.4-10.5 mg/dL 6.9 (L)  GFR calc non Af Amer Latest Range: >90 mL/min 42 (L)  GFR calc Af Amer Latest Range: >90 mL/min 49 (L)  Glucose Latest Range: 70-99 mg/dL 116 (H)  WBC Latest Range: 4.0-10.5 K/uL 10.7 (H)  RBC Latest Range: 3.87-5.11 MIL/uL 2.61 (L)  Hemoglobin Latest Range: 12.0-15.0 g/dL 7.3 (L)  HCT Latest Range: 36.0-46.0 % 22.7 (L)  MCV Latest Range: 78.0-100.0 fL 87.0  MCH Latest Range: 26.0-34.0 pg 28.0  MCHC Latest Range: 30.0-36.0 g/dL 32.2  RDW Latest Range: 11.5-15.5 % 14.5  Platelets Latest Range: 150-400 K/uL 158   CBG (last 3)   Recent Labs  10/10/12 1554 10/10/12 2139 10/11/12 0650  GLUCAP 197* 124* 135*    Vit D, 25-Hydroxy  12 (L)      Exam  Gen: awake and alert, lying in bed, NAD Lungs: clear anterior fields Cardiac: s1 and s2,  reg Abd: + BS, soft, obese, NT Ext:            Left Lower Extremity  Swelling stable  Wounds look fantastic  No signs of infection  Ext warm  + DP pulse  No DCT  Distal motor and sensory functions intact     Assessment and Plan  2 Days Post-Op 67 y/o white female s/p fall  1. fall 2. L distal femur fracture s/p ORIF POD 2               Unrestricted ROM  Will d/c immobilizer, believe this may become more of a hindrance  Total knee precautions                       NWB L leg             TED to L leg             Ice             Elevate  3. Medical issues                           DM                         SSI    Better sugar control               HTN                         improving      CKD   Per Dr. Mercy Moore               4 Pain management:             Low dose narcotics             Tylenol  5.DVT/PE prophylaxis:             Foot pumps              lovenox bridge to coumadin   TEDs  6. Metabolic Bone Disease:             Pt takes vitamin d             Clinical pt seems to have osteoporosis given low energy mechanism             vitamin D levels are low, check PTH  Pt on calcitriol, no sure how effective this med is for this pt. Given CKD, defer medication changes to PCP and/or renal MD's   Pt will likely need some form of pharmacologic tx post hospital   7. Activity:             OOB with assist             NWB Left leg  Unrestricted ROM L knee   8. FEN/Foley/Lines:                           CHO modified diet                          Nausea/vomiting                       improved              Hypokalemia                            improved                           Foley                         Maintain foley for strict I&O's, giving blood today   Bowel regimen- miralax BID   9. UTI             Urine cx- klebsiella, sensitive to cefazolin  Continue cefazolin for another 4 days, this would give 4 days of tx, if goes before  IV completed can convert to PO for the remaining tx period               10. Impediments to fracture healing:  Low bone density             DM             Renal disease             Limited mobility  11. ABL anemia  Will transfuse 2 units of PRBC's today  monitor  12. Dispo:  Continue therapies  Suspect pt will need SNF  Concerned that she will be very slow to mobilize          Jari Pigg, PA-C [facility] [phone] (P) 10/11/2012 8:49 AM

## 2012-10-11 NOTE — Progress Notes (Signed)
Physical Therapy Treatment Patient Details Name: [name] MRN: [id number] DOB: 07/28/1945 Today's Date: 10/11/2012 Time: [id number] PT Time Calculation (min): 24 min  PT Assessment / Plan / Recommendation Comments on Treatment Session  pt progressing, answered multiple questions regarding rehab/SNF therapy expectations; Pt planning for SNF in [place] and will benefit form such;     Follow Up Recommendations  SNF;Supervision/Assistance - 24 hour;Supervision for mobility/OOB     Does the patient have the potential to tolerate intense rehabilitation     Barriers to Discharge        Equipment Recommendations  None recommended by PT    Recommendations for Other Services    Frequency Min 3X/week   Plan Discharge plan remains appropriate;Frequency needs to be updated    Precautions / Restrictions Precautions Precautions: Fall Restrictions LLE Weight Bearing: Non weight bearing   Pertinent Vitals/Pain Denies pain     Mobility  Bed Mobility Bed Mobility: Supine to Sit;Sitting - Scoot to Edge of Bed;Sit to Supine;Scooting to [facility] Supine to Sit: 3: Mod assist;HOB elevated;With rails Sitting - Scoot to Edge of Bed: 4: Min assist Sit to Supine: 3: Mod assist Scooting to HOB: 4: Min assist;With rail Details for Bed Mobility Assistance: assist with LLE on and off bed and multimodal cues to control trunk/self assist; support/stabililzation of right LE to allow pt to scoot to [facility] in supine Transfers Transfers: Not assessed (pt declined; also Hgb 7.3)    Exercises General Exercises - Lower Extremity Ankle Circles/Pumps: AROM;Both;15 reps Quad Sets: AROM;Both;15 reps Short Arc Quad: AROM;AAROM;Left;15 reps Long Arc Quad: AAROM;Seated;Both;15 reps Heel Slides: AROM;AAROM;Both;15 reps Hip ABduction/ADduction: AROM;AAROM;Both;15 reps Other Exercises Other Exercises: left knee flexion with gravitiy assisted x10   PT Diagnosis:    PT Problem List:   PT Treatment Interventions:      PT Goals Acute Rehab PT Goals Time For Goal Achievement: 10/15/12 Potential to Achieve Goals: Good Pt will go Supine/Side to Sit: with modified independence PT Goal: Supine/Side to Sit - Progress: Progressing toward goal Pt will go Sit to Supine/Side: with modified independence PT Goal: Sit to Supine/Side - Progress: Progressing toward goal  Visit Information  Last PT Received On: 10/11/12 Assistance Needed: +2 (if amb/transfer)    Subjective Data  Subjective: am i doing good? Patient Stated Goal: rehab in [place]: Awake/alert Behavior During Therapy: [facility] for tasks assessed/performed Overall Cognitive Status: Within Functional Limits for tasks assessed    Balance  Static Sitting Balance Static Sitting - Balance Support: No upper extremity supported;Feet supported Static Sitting - Level of Assistance: 6: Modified independent (Device/Increase time) Static Sitting - Comment/# of Minutes: 47min  End of Session PT - End of Session Activity Tolerance: Patient tolerated treatment well Patient left: in bed;with call bell/phone within reach;with family/visitor present   GP     [facility] 10/11/2012, 1:19 PM

## 2012-10-12 ENCOUNTER — Encounter (HOSPITAL_COMMUNITY): Payer: Self-pay | Admitting: Orthopedic Surgery

## 2012-10-12 DIAGNOSIS — L8991 Pressure ulcer of unspecified site, stage 1: Secondary | ICD-10-CM | POA: Diagnosis not present

## 2012-10-12 DIAGNOSIS — Z79899 Other long term (current) drug therapy: Secondary | ICD-10-CM | POA: Diagnosis not present

## 2012-10-12 DIAGNOSIS — K5900 Constipation, unspecified: Secondary | ICD-10-CM | POA: Diagnosis not present

## 2012-10-12 DIAGNOSIS — L8990 Pressure ulcer of unspecified site, unspecified stage: Secondary | ICD-10-CM | POA: Diagnosis not present

## 2012-10-12 DIAGNOSIS — S7290XD Unspecified fracture of unspecified femur, subsequent encounter for closed fracture with routine healing: Secondary | ICD-10-CM | POA: Diagnosis not present

## 2012-10-12 DIAGNOSIS — S7290XA Unspecified fracture of unspecified femur, initial encounter for closed fracture: Secondary | ICD-10-CM | POA: Diagnosis not present

## 2012-10-12 DIAGNOSIS — M793 Panniculitis, unspecified: Secondary | ICD-10-CM | POA: Diagnosis not present

## 2012-10-12 DIAGNOSIS — F411 Generalized anxiety disorder: Secondary | ICD-10-CM | POA: Diagnosis not present

## 2012-10-12 DIAGNOSIS — K219 Gastro-esophageal reflux disease without esophagitis: Secondary | ICD-10-CM | POA: Diagnosis not present

## 2012-10-12 DIAGNOSIS — B961 Klebsiella pneumoniae [K. pneumoniae] as the cause of diseases classified elsewhere: Secondary | ICD-10-CM | POA: Diagnosis not present

## 2012-10-12 DIAGNOSIS — K449 Diaphragmatic hernia without obstruction or gangrene: Secondary | ICD-10-CM | POA: Diagnosis not present

## 2012-10-12 DIAGNOSIS — L98499 Non-pressure chronic ulcer of skin of other sites with unspecified severity: Secondary | ICD-10-CM | POA: Diagnosis not present

## 2012-10-12 DIAGNOSIS — D638 Anemia in other chronic diseases classified elsewhere: Secondary | ICD-10-CM | POA: Diagnosis not present

## 2012-10-12 DIAGNOSIS — E119 Type 2 diabetes mellitus without complications: Secondary | ICD-10-CM | POA: Diagnosis not present

## 2012-10-12 DIAGNOSIS — Z9181 History of falling: Secondary | ICD-10-CM | POA: Diagnosis not present

## 2012-10-12 DIAGNOSIS — W19XXXA Unspecified fall, initial encounter: Secondary | ICD-10-CM | POA: Diagnosis not present

## 2012-10-12 DIAGNOSIS — R6889 Other general symptoms and signs: Secondary | ICD-10-CM

## 2012-10-12 DIAGNOSIS — L8993 Pressure ulcer of unspecified site, stage 3: Secondary | ICD-10-CM | POA: Diagnosis not present

## 2012-10-12 DIAGNOSIS — I10 Essential (primary) hypertension: Secondary | ICD-10-CM | POA: Diagnosis not present

## 2012-10-12 DIAGNOSIS — L97809 Non-pressure chronic ulcer of other part of unspecified lower leg with unspecified severity: Secondary | ICD-10-CM | POA: Diagnosis not present

## 2012-10-12 DIAGNOSIS — S72499A Other fracture of lower end of unspecified femur, initial encounter for closed fracture: Secondary | ICD-10-CM | POA: Diagnosis not present

## 2012-10-12 DIAGNOSIS — E785 Hyperlipidemia, unspecified: Secondary | ICD-10-CM | POA: Diagnosis not present

## 2012-10-12 DIAGNOSIS — L89309 Pressure ulcer of unspecified buttock, unspecified stage: Secondary | ICD-10-CM | POA: Diagnosis not present

## 2012-10-12 DIAGNOSIS — N390 Urinary tract infection, site not specified: Secondary | ICD-10-CM | POA: Diagnosis not present

## 2012-10-12 DIAGNOSIS — L8994 Pressure ulcer of unspecified site, stage 4: Secondary | ICD-10-CM | POA: Diagnosis not present

## 2012-10-12 DIAGNOSIS — L89109 Pressure ulcer of unspecified part of back, unspecified stage: Secondary | ICD-10-CM | POA: Diagnosis not present

## 2012-10-12 DIAGNOSIS — Z5189 Encounter for other specified aftercare: Secondary | ICD-10-CM | POA: Diagnosis not present

## 2012-10-12 DIAGNOSIS — S31809A Unspecified open wound of unspecified buttock, initial encounter: Secondary | ICD-10-CM | POA: Diagnosis not present

## 2012-10-12 DIAGNOSIS — I129 Hypertensive chronic kidney disease with stage 1 through stage 4 chronic kidney disease, or unspecified chronic kidney disease: Secondary | ICD-10-CM | POA: Diagnosis not present

## 2012-10-12 DIAGNOSIS — E039 Hypothyroidism, unspecified: Secondary | ICD-10-CM | POA: Diagnosis not present

## 2012-10-12 DIAGNOSIS — R293 Abnormal posture: Secondary | ICD-10-CM | POA: Diagnosis not present

## 2012-10-12 DIAGNOSIS — R279 Unspecified lack of coordination: Secondary | ICD-10-CM | POA: Diagnosis not present

## 2012-10-12 DIAGNOSIS — IMO0002 Reserved for concepts with insufficient information to code with codable children: Secondary | ICD-10-CM | POA: Diagnosis not present

## 2012-10-12 DIAGNOSIS — L89609 Pressure ulcer of unspecified heel, unspecified stage: Secondary | ICD-10-CM | POA: Diagnosis not present

## 2012-10-12 DIAGNOSIS — M79609 Pain in unspecified limb: Secondary | ICD-10-CM | POA: Diagnosis not present

## 2012-10-12 DIAGNOSIS — L8992 Pressure ulcer of unspecified site, stage 2: Secondary | ICD-10-CM | POA: Diagnosis not present

## 2012-10-12 DIAGNOSIS — B372 Candidiasis of skin and nail: Secondary | ICD-10-CM | POA: Diagnosis not present

## 2012-10-12 DIAGNOSIS — M6281 Muscle weakness (generalized): Secondary | ICD-10-CM | POA: Diagnosis not present

## 2012-10-12 DIAGNOSIS — R269 Unspecified abnormalities of gait and mobility: Secondary | ICD-10-CM | POA: Diagnosis not present

## 2012-10-12 DIAGNOSIS — L03317 Cellulitis of buttock: Secondary | ICD-10-CM | POA: Diagnosis not present

## 2012-10-12 LAB — BASIC METABOLIC PANEL
Chloride: 100 mEq/L (ref 96–112)
Creatinine, Ser: 1.44 mg/dL — ABNORMAL HIGH (ref 0.50–1.10)
GFR calc Af Amer: 42 mL/min — ABNORMAL LOW (ref >90)
GFR calc non Af Amer: 37 mL/min — ABNORMAL LOW (ref >90)

## 2012-10-12 LAB — TYPE AND SCREEN
ABO/RH(D): O POS
Antibody Screen: NEGATIVE
Unit division: 0
Unit division: 0

## 2012-10-12 LAB — CBC
MCHC: 33.9 g/dL (ref 30.0–36.0)
MCV: 85.4 fL (ref 78.0–100.0)
Platelets: 157 10*3/uL (ref 150–400)
RDW: 14.6 % (ref 11.5–15.5)
WBC: 10.9 10*3/uL — ABNORMAL HIGH (ref 4.0–10.5)

## 2012-10-12 LAB — PROTIME-INR: Prothrombin Time: 15.2 seconds (ref 11.6–15.2)

## 2012-10-12 LAB — GLUCOSE, CAPILLARY
Glucose-Capillary: 64 mg/dL — ABNORMAL LOW (ref 70–99)
Glucose-Capillary: 186 mg/dL — ABNORMAL HIGH (ref 70–99)

## 2012-10-12 MED ORDER — DSS 100 MG PO CAPS: 100.0000 mg | ORAL_CAPSULE | Freq: Two times a day (BID) | ORAL | Status: DC

## 2012-10-12 MED ORDER — CEPHALEXIN 500 MG PO CAPS
500.0000 mg | ORAL_CAPSULE | Freq: Three times a day (TID) | ORAL | Status: DC
  Administered 2012-10-12: 500 mg via ORAL
  Filled 2012-10-12 (×3): qty 1

## 2012-10-12 MED ORDER — METHOCARBAMOL 500 MG PO TABS: 500.0000 mg | ORAL_TABLET | Freq: Four times a day (QID) | ORAL | Status: DC | PRN

## 2012-10-12 MED ORDER — HYDROCODONE-ACETAMINOPHEN 5-325 MG PO TABS: 1.0000 | ORAL_TABLET | Freq: Four times a day (QID) | ORAL | Status: DC | PRN

## 2012-10-12 MED ORDER — BISACODYL 10 MG RE SUPP: 10.0000 mg | Freq: Every day | RECTAL | Status: DC | PRN

## 2012-10-12 MED ORDER — CEPHALEXIN 500 MG PO CAPS: 500.0000 mg | ORAL_CAPSULE | Freq: Three times a day (TID) | ORAL | Status: DC

## 2012-10-12 MED ORDER — WARFARIN SODIUM 5 MG PO TABS: 5.0000 mg | ORAL_TABLET | Freq: Every day | ORAL | Status: DC

## 2012-10-12 MED ORDER — ADULT MULTIVITAMIN W/MINERALS CH: 1.0000 | ORAL_TABLET | Freq: Every day | ORAL | Status: DC

## 2012-10-12 MED ORDER — POLYETHYLENE GLYCOL 3350 17 G PO PACK: 17.0000 g | PACK | Freq: Two times a day (BID) | ORAL | Status: DC

## 2012-10-12 NOTE — Discharge Summary (Signed)
Physician Discharge Summary  Patient ID: [name] MRN: [id number] DOB/AGE: 08/13/1945 67 y.o.  Admit date: 10/06/2012 Discharge date: 10/12/2012  Primary Care Physician:  Redge Gainer, MD  Discharge Diagnoses:    . closed fracture of left distal femur status post mechanical fall s/p ORIF on 10/10/12 . DM . HYPERLIPIDEMIA . HYPERTENSION . chronic kidney disease stage II  . Klebsiella pneumonia UTI (urinary tract infection) Postop anemia status post 2 units packed RBC transfusion on 10/11/12  Consults: Orthopedics, Dr. Marcelino Scot  Outpatient Follow-up and recommendations:  OOB with assist  NWB Left leg  Unrestricted ROM L knee    Allergies:   Allergies  Allergen Reactions  . Aspirin     vomiting  . Ciprofloxacin     unknown  . Codeine     Makes me sick   . Micardis (Telmisartan)     unknown  . Rofecoxib     unknown     Discharge Medications:   Medication List    STOP taking these medications       furosemide 40 MG tablet  Commonly known as:  LASIX     losartan 100 MG tablet  Commonly known as:  COZAAR      TAKE these medications       atorvastatin 40 MG tablet  Commonly known as:  LIPITOR  Take 1 tablet (40 mg total) by mouth daily.     bisacodyl 10 MG suppository  Commonly known as:  DULCOLAX  Place 1 suppository (10 mg total) rectally daily as needed.     calcitRIOL 0.25 MCG capsule  Commonly known as:  ROCALTROL  Take 0.25 mcg by mouth daily.     cephALEXin 500 MG capsule  Commonly known as:  KEFLEX  Take 1 capsule (500 mg total) by mouth every 8 (eight) hours. X 4 days     cloNIDine 0.2 MG tablet  Commonly known as:  CATAPRES  Take 1 tablet (0.2 mg total) by mouth 2 (two) times daily.     DSS 100 MG Caps  Take 100 mg by mouth 2 (two) times daily.     glimepiride 2 MG tablet  Commonly known as:  AMARYL  Take 2 mg by mouth daily before breakfast.     HYDROcodone-acetaminophen 5-325 MG per tablet  Commonly known as:  NORCO/VICODIN   Take 1-2 tablets by mouth every 6 (six) hours as needed.     levothyroxine 75 MCG tablet  Commonly known as:  SYNTHROID, LEVOTHROID  Take 1 tablet (75 mcg total) by mouth daily before breakfast.     methocarbamol 500 MG tablet  Commonly known as:  ROBAXIN  Take 1 tablet (500 mg total) by mouth every 6 (six) hours as needed.     metoCLOPramide 5 MG tablet  Commonly known as:  REGLAN  Take 1 tablet (5 mg total) by mouth 4 (four) times daily.     metoprolol succinate 50 MG 24 hr tablet  Commonly known as:  TOPROL-XL  Take 50 mg by mouth daily.     multivitamin with minerals Tabs  Take 1 tablet by mouth daily.     pantoprazole 40 MG tablet  Commonly known as:  PROTONIX  Take 1 tablet (40 mg total) by mouth 2 (two) times daily.     pioglitazone 30 MG tablet  Commonly known as:  ACTOS  Take 1 tablet (30 mg total) by mouth daily.     polyethylene glycol packet  Commonly known as:  MIRALAX / GLYCOLAX  Take 17 g by mouth 2 (two) times daily.     warfarin 5 MG tablet  Commonly known as:  COUMADIN  Take 1 tablet (5 mg total) by mouth daily. For 6 weeks for DVT prophylaxis, please adjust dose according to PT/INR         Brief H and P: For complete details please refer to admission H and P, but in brief [name] is a 67 y.o. female who was sweeping off the steps at her home and tripped and fell onto her left side. She had increased pain in her left hip and knee and EMS was called. She was evaluated in the ED and was found to have a closed fracture of the Distal Left Femur. She denied having any syncope or dizziness associated with her fall.    Hospital Course:  Fracture of femur, distal, left, closed due to fall - Postop day #3, status post ORIF  Orthopedics was consulted and patient underwent open reduction and internal fixation of the left distal femur fracture with intercondylar extension. Continue Coumadin for 6 weeks for DVT prophylaxis. Please check PT/INR on Monday ,  5/ 26 and adjust Coumadin dose.  DM - continue outpatient oral hypoglycemics  Anemia: Likely secondary to anemia of blood loss, postop.  - Transfused on 10/11/2012, hemoglobin was 7.3, H&H today is 9.7/28.6   HYPERLIPIDEMIA: Continue statins   HYPERTENSION - Continue clonidine, metoprolol   Chronic kidney disease stage II, follows Dr. Mercy Moore: Outpatient followup   Klebsiella pneumonia UTI (urinary tract infection)  -Continue Keflex for next 4 days for total of seven-day course.   Day of Discharge BP 150/70  Pulse 91  Temp(Src) 98.6 F (37 C) (Oral)  Resp 16  SpO2 98%  Physical Exam: General: Alert and awake oriented x3 not in any acute distress. CVS: S1-S2 clear no murmur rubs or gallops Chest: clear to auscultation bilaterally, no wheezing rales or rhonchi Abdomen: soft nontender, nondistended, normal bowel sounds,  Extremities: no cyanosis, clubbing or edema noted bilaterally Neuro: Cranial nerves II-XII intact, no focal neurological deficits   The results of significant diagnostics from this hospitalization (including imaging, microbiology, ancillary and laboratory) are listed below for reference.    LAB RESULTS: Basic Metabolic Panel:  Recent Labs Lab 10/11/12 0532 10/12/12 0450  NA 139 136  K 4.2 3.9  CL 106 100  CO2 23 23  GLUCOSE 116* 68*  BUN 17 18  CREATININE 1.29* 1.44*  CALCIUM 6.9* 7.6*   Liver Function Tests:  Recent Labs Lab 10/06/12 1820 10/07/12 0545  AST 21  --   ALT 7  --   ALKPHOS 178*  --   BILITOT 0.4  --   PROT 6.9  --   ALBUMIN 3.4* 3.2*   No results found for this basename: LIPASE, AMYLASE,  in the last 168 hours No results found for this basename: AMMONIA,  in the last 168 hours CBC:  Recent Labs Lab 10/11/12 0532 10/12/12 0450  WBC 10.7* 10.9*  HGB 7.3* 9.7*  HCT 22.7* 28.6*  MCV 87.0 85.4  PLT 158 157   Cardiac Enzymes: No results found for this basename: CKTOTAL, CKMB, CKMBINDEX, TROPONINI,  in the last 168  hours BNP: No components found with this basename: POCBNP,  CBG:  Recent Labs Lab 10/12/12 0643 10/12/12 0741  GLUCAP 64* 167*    Significant Diagnostic Studies:  Dg Chest 2 View  10/06/2012   *RADIOLOGY REPORT*  Clinical Data: Status post fall.  Knee pain  CHEST - 2 VIEW  Comparison: 01/08/2011  Findings: Heart size is normal.  There is no pleural effusion or edema.  Asymmetric elevation of the right hemidiaphragm noted.  There is no airspace consolidation identified.  Review of the visualized osseous structures is unremarkable.  IMPRESSION:  1.  No acute cardiopulmonary abnormalities. 2.  Asymmetric elevation of the right hemidiaphragm.   Original Report Authenticated By: Kerby Moors, M.D.   Ct Knee Left Wo Contrast  10/06/2012   *RADIOLOGY REPORT*  Clinical Data: Femur fracture.  Fall.  CT OF THE LEFT KNEE WITHOUT CONTRAST  Technique:  Multidetector CT imaging was performed according to the standard protocol. Multiplanar CT image reconstructions were also generated.  Comparison: 10/06/2012  Findings: Comminuted distal femoral fracture extends from the distal metadiaphysis to the intercondylar notch and articular surfaces of the lateral femoral condyle.  Several prior fracture planes extend into the lateral femoral trochlear groove and central intercondylar notch  No significantly depressed cortical fragments extend into the comminuted fracture planes.  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  Chronically fragmented osteophytes along the superior patellar margin.  Hemarthrosis noted.  Suspected lipohemarthrosis and in the Baker's cyst.  Amorphous calcification along the expected tract of the popliteus tendon.  Irregular amorphous calcification along the distal iliotibial band.  IMPRESSION:  1.  Prominently comminuted distal femoral fracture extending from the metaphysis to the intercondylar notch and lateral condylar articular surface. No cortical  fragments embedded within the fracture planes.  2.  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   Original Report Authenticated By: Van Clines, M.D.   Dg Knee Complete 4 Views Left  10/06/2012   *RADIOLOGY REPORT*  Clinical Data: Fall with pain and swelling.  LEFT KNEE - COMPLETE 4+ VIEW  Comparison: None.  Findings: Vascular calcifications.  Three compartment underlying osteoarthritis.  Comminuted, intra-articular fracture of the distal femur.  Fracture fragment extends out the medial aspect of the supracondylar region and extends distally into the intercondylar region of the femur.  A bone fragment projecting proximal to the patella on the lateral view may have arisen from the distal femur. No definite source within the patella.  Moderate suprapatellar joint effusion.  IMPRESSION: Intra-articular fracture of the distal femur with probable comminution and concurrent suprapatellar joint effusion.  Consider CT.  Underlying osteopenia.  Advanced vascular calcifications.  Likely related to diabetes.   Original Report Authenticated By: Abigail Miyamoto, M.D.    2D ECHO:   Disposition and Follow-up:     Discharge Orders   Future Appointments Provider Department Dept Phone   10/25/2012 8:00 AM [facility] [phone]   11/29/2012 9:30 AM [facility] [phone]   11/29/2012 10:00 AM [facility] Pharmacist [facility] [phone]   Future Orders Complete By Expires     Diet Carb Modified  As directed     Discharge instructions  As directed     Comments:      [facility] Discharge Instructions,   General Discharge Instructions  WEIGHT BEARING STATUS: NONWEIGHTBEARING Left Leg  RANGE OF MOTION/ACTIVITY:Range of motion as tolerated L knee.  PT work on active and passive ROM   Diet: as you were eating previously.  Can use over the counter stool softeners and bowel  preparations, such as Miralax, to help with bowel movements.  Narcotics can be constipating.  Be sure to drink plenty of fluids  STOP SMOKING OR USING NICOTINE PRODUCTS!!!!  As discussed nicotine severely  impairs your body's ability to heal surgical and traumatic wounds but also impairs bone healing.  Wounds and bone heal by forming microscopic blood vessels (angiogenesis) and nicotine is a vasoconstrictor (essentially, shrinks blood vessels).  Therefore, if vasoconstriction occurs to these microscopic blood vessels they essentially disappear and are unable to deliver necessary nutrients to the healing tissue.  This is one modifiable factor that you can do to dramatically increase your chances of healing your injury.    (This means no smoking, no nicotine gum, patches, etc)  DO NOT USE NONSTEROIDAL ANTI-INFLAMMATORY DRUGS (NSAID'S)  Using products such as Advil (ibuprofen), Aleve (naproxen), Motrin (ibuprofen) for additional pain control during fracture healing can delay and/or prevent the healing response.  If you would like to take over the counter (OTC) medication, Tylenol (acetaminophen) is ok.  However, some narcotic medications that are given for pain control contain acetaminophen as well. Therefore, you should not exceed more than 4000 mg of tylenol in a day if you do not have liver disease.  Also note that there are may OTC medicines, such as cold medicines and allergy medicines that my contain tylenol as well.  If you have any questions about medications and/or interactions please ask your doctor/PA or your pharmacist.   PAIN MEDICATION USE AND EXPECTATIONS  You have likely been given narcotic medications to help control your pain.  After a traumatic event that results in an fracture (broken bone) with or without surgery, it is ok to use narcotic pain medications to help control one's pain.  We understand that everyone responds to pain differently and each individual patient will be evaluated on a  regular basis for the continued need for narcotic medications. Ideally, narcotic medication use should last no more than 6-8 weeks (coinciding with fracture healing).   As a patient it is your responsibility as well to monitor narcotic medication use and report the amount and frequency you use these medications when you come to your office visit.   We would also advise that if you are using narcotic medications, you should take a dose prior to therapy to maximize you participation.  IF YOU ARE ON NARCOTIC MEDICATIONS IT IS NOT PERMISSIBLE TO OPERATE A MOTOR VEHICLE (MOTORCYCLE/CAR/TRUCK/MOPED) OR HEAVY MACHINERY DO NOT MIX NARCOTICS WITH OTHER CNS (CENTRAL NERVOUS SYSTEM) DEPRESSANTS SUCH AS ALCOHOL       ICE AND ELEVATE INJURED/OPERATIVE EXTREMITY  Using ice and elevating the injured extremity above your heart can help with swelling and pain control.  Icing in a pulsatile fashion, such as 20 minutes on and 20 minutes off, can be followed.    Do not place ice directly on skin. Make sure there is a barrier between to skin and the ice pack.    Using frozen items such as frozen peas works well as the conform nicely to the are that needs to be iced.  USE AN ACE WRAP OR TED HOSE FOR SWELLING CONTROL  In addition to icing and elevation, Ace wraps or TED hose are used to help limit and resolve swelling.  It is recommended to use Ace wraps or TED hose until you are informed to stop.    When using Ace Wraps start the wrapping distally (farthest away from the body) and wrap proximally (closer to the body)   Example: If you had surgery on your leg or thing and you do not have a splint on, start the ace wrap at the toes and work your way up to the thigh  If you had surgery on your upper extremity and do not have a splint on, start the ace wrap at your fingers and work your way up to the upper arm  IF YOU ARE IN A SPLINT OR CAST DO NOT REMOVE IT FOR ANY REASON   If your splint gets wet for any reason  please contact the office immediately. You may shower in your splint or cast as long as you keep it dry.  This can be done by wrapping in a cast cover or garbage back (or similar)  Do Not stick any thing down your splint or cast such as pencils, money, or hangers to try and scratch yourself with.  If you feel itchy take benadryl as prescribed on the bottle for itching  IF YOU ARE IN A CAM BOOT (BLACK BOOT)  You may remove boot periodically. Perform daily dressing changes as noted below.  Wash the liner of the boot regularly and wear a sock when wearing the boot. It is recommended that you sleep in the boot until told otherwise  CALL THE OFFICE WITH ANY QUESTIONS OR CONCERTS: [phone]     Discharge Pin Site Instructions  Dress pins daily with Kerlix roll starting on POD 2. Wrap the Kerlix so that it tamps the skin down around the pin-skin interface to prevent/limit motion of the skin relative to the pin.  (Pin-skin motion is the primary cause of pain and infection related to external fixator pin sites).  Remove any crust or coagulum that may obstruct drainage with a saline moistened gauze or soap and water.  After POD 3, if there is no discernable drainage on the pin site dressing, the interval for change can by increased to every other day.  You may shower with the fixator, cleaning all pin sites gently with soap and water.  If you have a surgical wound this needs to be completely dry and without drainage before showering.  The extremity can be lifted by the fixator to facilitate wound care and transfers.  Notify the office/Doctor if you experience increasing drainage, redness, or pain from a pin site, or if you notice purulent (thick, snot-like) drainage.  Discharge Wound Care Instructions  Do NOT apply any ointments, solutions or lotions to pin sites or surgical wounds.  These prevent needed drainage and even though solutions like hydrogen peroxide kill bacteria, they also damage  cells lining the pin sites that help fight infection.  Applying lotions or ointments can keep the wounds moist and can cause them to breakdown and open up as well. This can increase the risk for infection. When in doubt call the office.  Surgical incisions should be dressed daily.  If any drainage is noted, use one layer of adaptic, then gauze, Kerlix, and an ace wrap.  Once the incision is completely dry and without drainage, it may be left open to air out.  Showering may begin 36-48 hours later.  Cleaning gently with soap and water.  Traumatic wounds should be dressed daily as well.    One layer of adaptic, gauze, Kerlix, then ace wrap.  The adaptic can be discontinued once the draining has ceased    If you have a wet to dry dressing: wet the gauze with saline the squeeze as much saline out so the gauze is moist (not soaking wet), place moistened gauze over wound, then place a dry gauze over the moist one, followed by Kerlix wrap, then ace wrap.    Discharge instructions  As directed  Comments:      Continue coumadin for 6 weeks for DVT prophylaxis. check PT/INR on Monday, 10/16/12, adjust coumadin dose according to INR results    Increase activity slowly  As directed     Non weight bearing  As directed     Comments:      Left leg    PT range of motion  As directed 10/12/2013    Comments:      Active and passive ROM L knee as tolerated    Total knee precautions  As directed     Scheduling Instructions:      Left        DISPOSITION: skilled facility   DIET: Carb modified diet  ACTIVITY:As tolerated  TESTS THAT NEED FOLLOW-UP  PT/INR on Monday, 10/16/12   DISCHARGE FOLLOW-UP Follow-up Information   Follow up with HANDY,MICHAEL H, MD. Schedule an appointment as soon as possible for a visit in 10 days. (call for appointment)    Contact information:   [address] [place], [place] [address] [phone]       Follow up with Redge Gainer, MD.  Schedule an appointment as soon as possible for a visit in 2 weeks.   Contact information:   [address] [place] [address] [phone]       Time spent on Discharge: 40 mins  Signed:   RAI,RIPUDEEP M.D. [facility] 10/12/2012, 10:43 AM Pager: [phone]

## 2012-10-12 NOTE — Progress Notes (Signed)
Clinical social worker assisted with patient discharge to skilled nursing facility, [facility].  CSW addressed all family questions and concerns. CSW copied chart and added all important documents. CSW also set up patient transportation with [employer] operational officer. Clinical Social Worker will sign off for now as social work intervention is no longer needed.   Rhea Pink, MSW [phone]

## 2012-10-12 NOTE — Progress Notes (Signed)
Orthopaedic Trauma Service Progress Note     3 Days Post-Op  Subjective   Doing much better No new issues  Tolerated transfusion well yesterday  Objective  BP 150/70  Pulse 91  Temp(Src) 98.6 F (37 C) (Oral)  Resp 16  SpO2 98%  Patient Vitals for the past 24 hrs:  BP Temp Temp src Pulse Resp SpO2  10/12/12 0500 150/70 mmHg 98.6 F (37 C) Oral 91 16 98 %  10/11/12 1943 163/61 mmHg 99.6 F (37.6 C) Oral 87 16 97 %  10/11/12 1845 159/64 mmHg 99.2 F (37.3 C) Oral 85 16 -  10/11/12 1745 153/48 mmHg 99.1 F (37.3 C) Oral 94 15 -  10/11/12 1645 151/60 mmHg 99.1 F (37.3 C) Oral 82 16 -  10/11/12 1630 152/72 mmHg 99 F (37.2 C) Oral 102 15 -  10/11/12 1435 139/54 mmHg 99 F (37.2 C) Oral 79 16 -  10/11/12 1340 128/59 mmHg 98.8 F (37.1 C) Oral 82 15 -  10/11/12 1240 116/53 mmHg 99.3 F (37.4 C) Oral 81 15 -  10/11/12 1140 167/68 mmHg 99.2 F (37.3 C) Oral 88 16 -  10/11/12 1115 175/69 mmHg 99.8 F (37.7 C) - 88 15 -    Intake/Output     05/21 0701 - 05/22 0700 05/22 0701 - 05/23 0700   P.O. 370    Blood 25    Total Intake 395     Urine 1600    Total Output 1600     Net -1205          Urine Occurrence 4 x      Labs Results for [name], [name] (MRN [id number]) as of 10/12/2012 10:11  Ref. Range 10/12/2012 04:50  Sodium Latest Range: 135-145 mEq/L 136  Potassium Latest Range: 3.5-5.1 mEq/L 3.9  Chloride Latest Range: 96-112 mEq/L 100  CO2 Latest Range: 19-32 mEq/L 23  BUN Latest Range: 6-23 mg/dL 18  Creatinine Latest Range: 0.50-1.10 mg/dL 1.44 (H)  Calcium Latest Range: 8.4-10.5 mg/dL 7.6 (L)  GFR calc non Af Amer Latest Range: >90 mL/min 37 (L)  GFR calc Af Amer Latest Range: >90 mL/min 42 (L)  Glucose Latest Range: 70-99 mg/dL 68 (L)  WBC Latest Range: 4.0-10.5 K/uL 10.9 (H)  RBC Latest Range: 3.87-5.11 MIL/uL 3.35 (L)  Hemoglobin Latest Range: 12.0-15.0 g/dL 9.7 (L)  HCT Latest Range: 36.0-46.0 % 28.6 (L)  MCV Latest Range: 78.0-100.0 fL 85.4  MCH  Latest Range: 26.0-34.0 pg 29.0  MCHC Latest Range: 30.0-36.0 g/dL 33.9  RDW Latest Range: 11.5-15.5 % 14.6  Platelets Latest Range: 150-400 K/uL 157  Prothrombin Time Latest Range: 11.6-15.2 seconds 15.2  INR Latest Range: 0.00-1.49  1.22   CBG (last 3)   Recent Labs  10/11/12 2045 10/12/12 0643 10/12/12 0741  GLUCAP 164* 64* 167*      Exam  Gen: sitting in chair, NAD Lungs: clear anterior fields Cardiac: s1 and s2 Abd: + BS, NT Ext:            Left Lower Extremity  Distal motor and sensory functions intact  Ext warm  + DP  Dressings c/d/i    Assessment and Plan  3 Days Post-Op  1. fall 2. L distal femur fracture s/p ORIF POD 3                          Unrestricted ROM             Will d/c immobilizer,  believe this may become more of a hindrance             Total knee precautions                       NWB L leg             TED to L leg             Ice             Elevate             3. Medical issues                           DM                         SSI                           Better sugar control               HTN                         improving                            CKD                         Per Dr. Mercy Moore                4 Pain management:             Low dose narcotics             Tylenol  5.DVT/PE prophylaxis:             Foot pumps               lovenox bridge to coumadin, continue coumadin for 6 weeks             TEDs  6. Metabolic Bone Disease:             Pt takes vitamin d             Clinical pt seems to have osteoporosis given low energy mechanism             vitamin D levels are low, check PTH             Pt on calcitriol, no sure how effective this med is for this pt. Given CKD, defer medication changes to PCP and/or renal MD's               Pt will likely need some form of pharmacologic tx post hospital   7. Activity:             OOB with assist             NWB Left leg             Unrestricted ROM L  knee   8. FEN/Foley/Lines:                            CHO modified diet  Nausea/vomiting                       improved              Hypokalemia                            improved                            Foley                         Maintain foley for strict I&O's, giving blood today              Bowel regimen- miralax BID   9. UTI             Urine cx- klebsiella, sensitive to cefazolin             Continue cefazolin for another 3 days, this would give 4 days of tx, if goes before IV completed can convert to PO for the remaining tx period               10. Impediments to fracture healing:             Low bone density             DM             Renal disease             Limited mobility  11. ABL anemia             improved  Feels better  12. Dispo:             Ok for SNF           Jari Pigg, PA-C [facility] [phone] (P) 10/12/2012 10:11 AM

## 2012-10-12 NOTE — Care Management Note (Unsigned)
    Page 1 of 1   10/12/2012     5:05:14 PM   CARE MANAGEMENT NOTE 10/12/2012  Patient:  [name], [name]   Account Number:  [id number]  Date Initiated:  10/07/2012  Documentation initiated by:  Ricki Miller  Subjective/Objective Assessment:   67 yr old female s/p fall     Action/Plan:   OR on 5/19 or 20. CM will follow   Anticipated DC Date:  10/13/2012   Anticipated DC Plan:  SKILLED NURSING FACILITY  In-house referral  Clinical Social Worker      DC Planning Services  CM consult      Choice offered to / List presented to:             Status of service:  In process, will continue to follow Medicare Important Message given?   (If response is "NO", the following Medicare IM given date fields will be blank) Date Medicare IM given:   Date Additional Medicare IM given:    Discharge Disposition:  [facility]  Per UR Regulation:    If discussed at Long Length of Stay Meetings, dates discussed:    Comments:

## 2012-10-12 NOTE — Progress Notes (Signed)
Am cbg was 64; 4oz of orange juice given. Glucose rechecked and was 167.  Day shift nurse made aware.

## 2012-10-12 NOTE — Progress Notes (Signed)
Chaplain responded to spiritual care consult for pt who requested chaplain visit before leaving for [facility] this afternoon. Pt's bag was packed and she was sitting in chair awaiting transport. She is facing 8 week rehab process as she recovers from broken leg. This is new territory for her and she wanted prayer. Chaplain provided emotional and spiritual for pt and prayed with her. Pt expressed great appreciation for the visit.

## 2012-10-20 DIAGNOSIS — S31809A Unspecified open wound of unspecified buttock, initial encounter: Secondary | ICD-10-CM | POA: Diagnosis not present

## 2012-10-25 ENCOUNTER — Encounter (HOSPITAL_COMMUNITY)
Admission: RE | Admit: 2012-10-25 | Discharge: 2012-10-25 | Disposition: A | Discharge status: Home / Self Care | Payer: Medicare Other | Source: Ambulatory Visit | Attending: Nephrology | Admitting: Nephrology

## 2012-10-25 DIAGNOSIS — S72499A Other fracture of lower end of unspecified femur, initial encounter for closed fracture: Secondary | ICD-10-CM | POA: Diagnosis not present

## 2012-10-25 DIAGNOSIS — N183 Chronic kidney disease, stage 3 unspecified: Secondary | ICD-10-CM | POA: Insufficient documentation

## 2012-10-25 DIAGNOSIS — D638 Anemia in other chronic diseases classified elsewhere: Secondary | ICD-10-CM | POA: Insufficient documentation

## 2012-10-25 NOTE — Progress Notes (Signed)
BP not met parameters for Procrit injection; pt scheduled for 0830 appointment with Dr. Marcelino Scot, therefore unable to stay for Clonidine.  Caregiver with pt. States she will call later in day to reschedule later in week

## 2012-10-30 ENCOUNTER — Encounter (HOSPITAL_COMMUNITY)
Admission: RE | Admit: 2012-10-30 | Discharge: 2012-10-30 | Disposition: A | Discharge status: Home / Self Care | Payer: Medicare Other | Source: Ambulatory Visit | Attending: Nephrology | Admitting: Nephrology

## 2012-10-30 LAB — POCT HEMOGLOBIN-HEMACUE: Hemoglobin: 9.9 g/dL — ABNORMAL LOW (ref 12.0–15.0)

## 2012-10-30 MED ORDER — DARBEPOETIN ALFA-POLYSORBATE 100 MCG/0.5ML IJ SOLN
INTRAMUSCULAR | Status: AC
  Filled 2012-10-30: qty 0.5

## 2012-10-30 MED ORDER — CLONIDINE HCL 0.1 MG PO TABS
ORAL_TABLET | ORAL | Status: AC
  Administered 2012-10-30: 14:00:00
  Filled 2012-10-30: qty 1

## 2012-10-30 MED ORDER — DARBEPOETIN ALFA-POLYSORBATE 100 MCG/0.5ML IJ SOLN
100.0000 ug | INTRAMUSCULAR | Status: DC
  Administered 2012-10-30: 100 ug via SUBCUTANEOUS

## 2012-10-31 DIAGNOSIS — L89609 Pressure ulcer of unspecified heel, unspecified stage: Secondary | ICD-10-CM | POA: Diagnosis not present

## 2012-10-31 DIAGNOSIS — L8993 Pressure ulcer of unspecified site, stage 3: Secondary | ICD-10-CM | POA: Diagnosis not present

## 2012-10-31 DIAGNOSIS — L8992 Pressure ulcer of unspecified site, stage 2: Secondary | ICD-10-CM | POA: Diagnosis not present

## 2012-10-31 DIAGNOSIS — L89109 Pressure ulcer of unspecified part of back, unspecified stage: Secondary | ICD-10-CM | POA: Diagnosis not present

## 2012-11-14 DIAGNOSIS — M793 Panniculitis, unspecified: Secondary | ICD-10-CM | POA: Diagnosis not present

## 2012-11-14 DIAGNOSIS — L98499 Non-pressure chronic ulcer of skin of other sites with unspecified severity: Secondary | ICD-10-CM | POA: Diagnosis not present

## 2012-11-14 DIAGNOSIS — L03317 Cellulitis of buttock: Secondary | ICD-10-CM | POA: Diagnosis not present

## 2012-11-14 DIAGNOSIS — L0231 Cutaneous abscess of buttock: Secondary | ICD-10-CM | POA: Diagnosis not present

## 2012-11-15 DIAGNOSIS — I10 Essential (primary) hypertension: Secondary | ICD-10-CM | POA: Diagnosis not present

## 2012-11-15 DIAGNOSIS — E785 Hyperlipidemia, unspecified: Secondary | ICD-10-CM | POA: Diagnosis not present

## 2012-11-21 DIAGNOSIS — L89109 Pressure ulcer of unspecified part of back, unspecified stage: Secondary | ICD-10-CM | POA: Diagnosis not present

## 2012-11-21 DIAGNOSIS — L8994 Pressure ulcer of unspecified site, stage 4: Secondary | ICD-10-CM | POA: Diagnosis not present

## 2012-11-21 DIAGNOSIS — L8991 Pressure ulcer of unspecified site, stage 1: Secondary | ICD-10-CM | POA: Diagnosis not present

## 2012-11-21 DIAGNOSIS — L89609 Pressure ulcer of unspecified heel, unspecified stage: Secondary | ICD-10-CM | POA: Diagnosis not present

## 2012-11-22 DIAGNOSIS — S72499A Other fracture of lower end of unspecified femur, initial encounter for closed fracture: Secondary | ICD-10-CM | POA: Diagnosis not present

## 2012-11-23 ENCOUNTER — Other Ambulatory Visit (HOSPITAL_COMMUNITY): Payer: Self-pay | Admitting: *Deleted

## 2012-11-27 ENCOUNTER — Encounter (HOSPITAL_COMMUNITY): Payer: Medicare Other

## 2012-11-27 ENCOUNTER — Encounter (HOSPITAL_COMMUNITY)
Admission: RE | Admit: 2012-11-27 | Discharge: 2012-11-27 | Disposition: A | Discharge status: Home / Self Care | Payer: Medicare Other | Source: Ambulatory Visit | Attending: Nephrology | Admitting: Nephrology

## 2012-11-27 DIAGNOSIS — L97809 Non-pressure chronic ulcer of other part of unspecified lower leg with unspecified severity: Secondary | ICD-10-CM | POA: Diagnosis not present

## 2012-11-27 DIAGNOSIS — D638 Anemia in other chronic diseases classified elsewhere: Secondary | ICD-10-CM | POA: Insufficient documentation

## 2012-11-27 DIAGNOSIS — N183 Chronic kidney disease, stage 3 unspecified: Secondary | ICD-10-CM | POA: Insufficient documentation

## 2012-11-27 MED ORDER — CLONIDINE HCL 0.1 MG PO TABS: 0.1000 mg | ORAL_TABLET | Freq: Once | ORAL | Status: AC | PRN

## 2012-11-27 MED ORDER — DARBEPOETIN ALFA-POLYSORBATE 100 MCG/0.5ML IJ SOLN
INTRAMUSCULAR | Status: AC
  Filled 2012-11-27: qty 0.5

## 2012-11-27 MED ORDER — DARBEPOETIN ALFA-POLYSORBATE 100 MCG/0.5ML IJ SOLN
100.0000 ug | INTRAMUSCULAR | Status: DC
  Administered 2012-11-27: 100 ug via SUBCUTANEOUS

## 2012-11-27 MED ORDER — CLONIDINE HCL 0.1 MG PO TABS
ORAL_TABLET | ORAL | Status: AC
  Administered 2012-11-27: 0.1 mg via ORAL
  Filled 2012-11-27: qty 1

## 2012-11-27 NOTE — Progress Notes (Signed)
BP 198/90 30 min. After 0.1 mg Clonidine: Dr. Kristen Loader office notified; message left for [facility]

## 2012-11-27 NOTE — Progress Notes (Signed)
BP 214/83; Clonidine 0.1 mg given per protocol

## 2012-11-29 ENCOUNTER — Ambulatory Visit: Payer: Medicare Other

## 2012-11-29 ENCOUNTER — Other Ambulatory Visit: Payer: Medicare Other

## 2012-11-30 ENCOUNTER — Other Ambulatory Visit: Payer: Self-pay | Admitting: Nurse Practitioner

## 2012-12-01 NOTE — Telephone Encounter (Signed)
Sending to u, because i didn't know if u wanted 3 refills?

## 2012-12-05 DIAGNOSIS — L8991 Pressure ulcer of unspecified site, stage 1: Secondary | ICD-10-CM | POA: Diagnosis not present

## 2012-12-05 DIAGNOSIS — L89609 Pressure ulcer of unspecified heel, unspecified stage: Secondary | ICD-10-CM | POA: Diagnosis not present

## 2012-12-05 DIAGNOSIS — L8994 Pressure ulcer of unspecified site, stage 4: Secondary | ICD-10-CM | POA: Diagnosis not present

## 2012-12-05 DIAGNOSIS — L89109 Pressure ulcer of unspecified part of back, unspecified stage: Secondary | ICD-10-CM | POA: Diagnosis not present

## 2012-12-06 DIAGNOSIS — S72499A Other fracture of lower end of unspecified femur, initial encounter for closed fracture: Secondary | ICD-10-CM | POA: Diagnosis not present

## 2012-12-08 DIAGNOSIS — S31809A Unspecified open wound of unspecified buttock, initial encounter: Secondary | ICD-10-CM | POA: Diagnosis not present

## 2012-12-08 DIAGNOSIS — L03317 Cellulitis of buttock: Secondary | ICD-10-CM | POA: Diagnosis not present

## 2012-12-08 DIAGNOSIS — L89609 Pressure ulcer of unspecified heel, unspecified stage: Secondary | ICD-10-CM | POA: Diagnosis not present

## 2012-12-08 DIAGNOSIS — E119 Type 2 diabetes mellitus without complications: Secondary | ICD-10-CM | POA: Diagnosis not present

## 2012-12-08 DIAGNOSIS — L8995 Pressure ulcer of unspecified site, unstageable: Secondary | ICD-10-CM | POA: Diagnosis not present

## 2012-12-08 DIAGNOSIS — R269 Unspecified abnormalities of gait and mobility: Secondary | ICD-10-CM | POA: Diagnosis not present

## 2012-12-08 DIAGNOSIS — L0231 Cutaneous abscess of buttock: Secondary | ICD-10-CM | POA: Diagnosis not present

## 2012-12-11 DIAGNOSIS — L89609 Pressure ulcer of unspecified heel, unspecified stage: Secondary | ICD-10-CM | POA: Diagnosis not present

## 2012-12-11 DIAGNOSIS — E119 Type 2 diabetes mellitus without complications: Secondary | ICD-10-CM | POA: Diagnosis not present

## 2012-12-11 DIAGNOSIS — S31809A Unspecified open wound of unspecified buttock, initial encounter: Secondary | ICD-10-CM | POA: Diagnosis not present

## 2012-12-11 DIAGNOSIS — L03317 Cellulitis of buttock: Secondary | ICD-10-CM | POA: Diagnosis not present

## 2012-12-11 DIAGNOSIS — R269 Unspecified abnormalities of gait and mobility: Secondary | ICD-10-CM | POA: Diagnosis not present

## 2012-12-11 DIAGNOSIS — L8995 Pressure ulcer of unspecified site, unstageable: Secondary | ICD-10-CM | POA: Diagnosis not present

## 2012-12-12 DIAGNOSIS — R269 Unspecified abnormalities of gait and mobility: Secondary | ICD-10-CM | POA: Diagnosis not present

## 2012-12-12 DIAGNOSIS — L03317 Cellulitis of buttock: Secondary | ICD-10-CM | POA: Diagnosis not present

## 2012-12-12 DIAGNOSIS — L8995 Pressure ulcer of unspecified site, unstageable: Secondary | ICD-10-CM | POA: Diagnosis not present

## 2012-12-12 DIAGNOSIS — L89609 Pressure ulcer of unspecified heel, unspecified stage: Secondary | ICD-10-CM | POA: Diagnosis not present

## 2012-12-12 DIAGNOSIS — S31809A Unspecified open wound of unspecified buttock, initial encounter: Secondary | ICD-10-CM | POA: Diagnosis not present

## 2012-12-12 DIAGNOSIS — E119 Type 2 diabetes mellitus without complications: Secondary | ICD-10-CM | POA: Diagnosis not present

## 2012-12-13 DIAGNOSIS — L8995 Pressure ulcer of unspecified site, unstageable: Secondary | ICD-10-CM | POA: Diagnosis not present

## 2012-12-13 DIAGNOSIS — S31809A Unspecified open wound of unspecified buttock, initial encounter: Secondary | ICD-10-CM | POA: Diagnosis not present

## 2012-12-13 DIAGNOSIS — L89609 Pressure ulcer of unspecified heel, unspecified stage: Secondary | ICD-10-CM | POA: Diagnosis not present

## 2012-12-13 DIAGNOSIS — R269 Unspecified abnormalities of gait and mobility: Secondary | ICD-10-CM | POA: Diagnosis not present

## 2012-12-13 DIAGNOSIS — E119 Type 2 diabetes mellitus without complications: Secondary | ICD-10-CM | POA: Diagnosis not present

## 2012-12-13 DIAGNOSIS — L0231 Cutaneous abscess of buttock: Secondary | ICD-10-CM | POA: Diagnosis not present

## 2012-12-15 DIAGNOSIS — L03317 Cellulitis of buttock: Secondary | ICD-10-CM | POA: Diagnosis not present

## 2012-12-15 DIAGNOSIS — S31809A Unspecified open wound of unspecified buttock, initial encounter: Secondary | ICD-10-CM | POA: Diagnosis not present

## 2012-12-15 DIAGNOSIS — R269 Unspecified abnormalities of gait and mobility: Secondary | ICD-10-CM | POA: Diagnosis not present

## 2012-12-15 DIAGNOSIS — E119 Type 2 diabetes mellitus without complications: Secondary | ICD-10-CM | POA: Diagnosis not present

## 2012-12-15 DIAGNOSIS — L89609 Pressure ulcer of unspecified heel, unspecified stage: Secondary | ICD-10-CM | POA: Diagnosis not present

## 2012-12-15 DIAGNOSIS — L8995 Pressure ulcer of unspecified site, unstageable: Secondary | ICD-10-CM | POA: Diagnosis not present

## 2012-12-18 ENCOUNTER — Other Ambulatory Visit (HOSPITAL_COMMUNITY): Payer: Self-pay | Admitting: *Deleted

## 2012-12-18 ENCOUNTER — Ambulatory Visit: Payer: Medicare Other | Admitting: Nurse Practitioner

## 2012-12-18 DIAGNOSIS — L8995 Pressure ulcer of unspecified site, unstageable: Secondary | ICD-10-CM | POA: Diagnosis not present

## 2012-12-18 DIAGNOSIS — R269 Unspecified abnormalities of gait and mobility: Secondary | ICD-10-CM | POA: Diagnosis not present

## 2012-12-18 DIAGNOSIS — E119 Type 2 diabetes mellitus without complications: Secondary | ICD-10-CM | POA: Diagnosis not present

## 2012-12-18 DIAGNOSIS — L03317 Cellulitis of buttock: Secondary | ICD-10-CM | POA: Diagnosis not present

## 2012-12-18 DIAGNOSIS — S31809A Unspecified open wound of unspecified buttock, initial encounter: Secondary | ICD-10-CM | POA: Diagnosis not present

## 2012-12-18 DIAGNOSIS — L89609 Pressure ulcer of unspecified heel, unspecified stage: Secondary | ICD-10-CM | POA: Diagnosis not present

## 2012-12-19 ENCOUNTER — Encounter (HOSPITAL_COMMUNITY)
Admission: RE | Admit: 2012-12-19 | Discharge: 2012-12-19 | Disposition: A | Discharge status: Home / Self Care | Payer: Medicare Other | Source: Ambulatory Visit | Attending: Nephrology | Admitting: Nephrology

## 2012-12-19 DIAGNOSIS — S31809A Unspecified open wound of unspecified buttock, initial encounter: Secondary | ICD-10-CM | POA: Diagnosis not present

## 2012-12-19 DIAGNOSIS — L8995 Pressure ulcer of unspecified site, unstageable: Secondary | ICD-10-CM | POA: Diagnosis not present

## 2012-12-19 DIAGNOSIS — R269 Unspecified abnormalities of gait and mobility: Secondary | ICD-10-CM | POA: Diagnosis not present

## 2012-12-19 DIAGNOSIS — E119 Type 2 diabetes mellitus without complications: Secondary | ICD-10-CM | POA: Diagnosis not present

## 2012-12-19 DIAGNOSIS — L89609 Pressure ulcer of unspecified heel, unspecified stage: Secondary | ICD-10-CM | POA: Diagnosis not present

## 2012-12-19 DIAGNOSIS — L0231 Cutaneous abscess of buttock: Secondary | ICD-10-CM | POA: Diagnosis not present

## 2012-12-19 DIAGNOSIS — D638 Anemia in other chronic diseases classified elsewhere: Secondary | ICD-10-CM | POA: Diagnosis not present

## 2012-12-19 LAB — IRON AND TIBC
Saturation Ratios: 20 % (ref 20–55)
TIBC: 268 ug/dL (ref 250–470)

## 2012-12-19 LAB — POCT HEMOGLOBIN-HEMACUE: Hemoglobin: 12.4 g/dL (ref 12.0–15.0)

## 2012-12-19 MED ORDER — DARBEPOETIN ALFA-POLYSORBATE 100 MCG/0.5ML IJ SOLN
100.0000 ug | INTRAMUSCULAR | Status: DC
  Administered 2012-12-19: 100 ug via SUBCUTANEOUS

## 2012-12-19 MED ORDER — DARBEPOETIN ALFA-POLYSORBATE 100 MCG/0.5ML IJ SOLN
INTRAMUSCULAR | Status: AC
  Filled 2012-12-19: qty 0.5

## 2012-12-20 DIAGNOSIS — E119 Type 2 diabetes mellitus without complications: Secondary | ICD-10-CM | POA: Diagnosis not present

## 2012-12-20 DIAGNOSIS — S31809A Unspecified open wound of unspecified buttock, initial encounter: Secondary | ICD-10-CM | POA: Diagnosis not present

## 2012-12-20 DIAGNOSIS — L03317 Cellulitis of buttock: Secondary | ICD-10-CM | POA: Diagnosis not present

## 2012-12-20 DIAGNOSIS — L89609 Pressure ulcer of unspecified heel, unspecified stage: Secondary | ICD-10-CM | POA: Diagnosis not present

## 2012-12-20 DIAGNOSIS — L8995 Pressure ulcer of unspecified site, unstageable: Secondary | ICD-10-CM | POA: Diagnosis not present

## 2012-12-20 DIAGNOSIS — R269 Unspecified abnormalities of gait and mobility: Secondary | ICD-10-CM | POA: Diagnosis not present

## 2012-12-22 DIAGNOSIS — R269 Unspecified abnormalities of gait and mobility: Secondary | ICD-10-CM | POA: Diagnosis not present

## 2012-12-22 DIAGNOSIS — S31809A Unspecified open wound of unspecified buttock, initial encounter: Secondary | ICD-10-CM | POA: Diagnosis not present

## 2012-12-22 DIAGNOSIS — E119 Type 2 diabetes mellitus without complications: Secondary | ICD-10-CM | POA: Diagnosis not present

## 2012-12-22 DIAGNOSIS — L8995 Pressure ulcer of unspecified site, unstageable: Secondary | ICD-10-CM | POA: Diagnosis not present

## 2012-12-22 DIAGNOSIS — L0231 Cutaneous abscess of buttock: Secondary | ICD-10-CM | POA: Diagnosis not present

## 2012-12-22 DIAGNOSIS — L89609 Pressure ulcer of unspecified heel, unspecified stage: Secondary | ICD-10-CM | POA: Diagnosis not present

## 2012-12-25 ENCOUNTER — Encounter (HOSPITAL_COMMUNITY): Payer: Medicare Other

## 2012-12-25 DIAGNOSIS — L03317 Cellulitis of buttock: Secondary | ICD-10-CM | POA: Diagnosis not present

## 2012-12-25 DIAGNOSIS — L89609 Pressure ulcer of unspecified heel, unspecified stage: Secondary | ICD-10-CM | POA: Diagnosis not present

## 2012-12-25 DIAGNOSIS — E119 Type 2 diabetes mellitus without complications: Secondary | ICD-10-CM | POA: Diagnosis not present

## 2012-12-25 DIAGNOSIS — S31809A Unspecified open wound of unspecified buttock, initial encounter: Secondary | ICD-10-CM | POA: Diagnosis not present

## 2012-12-25 DIAGNOSIS — R269 Unspecified abnormalities of gait and mobility: Secondary | ICD-10-CM | POA: Diagnosis not present

## 2012-12-25 DIAGNOSIS — L8995 Pressure ulcer of unspecified site, unstageable: Secondary | ICD-10-CM | POA: Diagnosis not present

## 2012-12-27 DIAGNOSIS — L8995 Pressure ulcer of unspecified site, unstageable: Secondary | ICD-10-CM | POA: Diagnosis not present

## 2012-12-27 DIAGNOSIS — L89609 Pressure ulcer of unspecified heel, unspecified stage: Secondary | ICD-10-CM | POA: Diagnosis not present

## 2012-12-27 DIAGNOSIS — R269 Unspecified abnormalities of gait and mobility: Secondary | ICD-10-CM | POA: Diagnosis not present

## 2012-12-27 DIAGNOSIS — E119 Type 2 diabetes mellitus without complications: Secondary | ICD-10-CM | POA: Diagnosis not present

## 2012-12-27 DIAGNOSIS — S31809A Unspecified open wound of unspecified buttock, initial encounter: Secondary | ICD-10-CM | POA: Diagnosis not present

## 2012-12-27 DIAGNOSIS — L03317 Cellulitis of buttock: Secondary | ICD-10-CM | POA: Diagnosis not present

## 2012-12-29 DIAGNOSIS — L8995 Pressure ulcer of unspecified site, unstageable: Secondary | ICD-10-CM | POA: Diagnosis not present

## 2012-12-29 DIAGNOSIS — L0231 Cutaneous abscess of buttock: Secondary | ICD-10-CM | POA: Diagnosis not present

## 2012-12-29 DIAGNOSIS — R269 Unspecified abnormalities of gait and mobility: Secondary | ICD-10-CM | POA: Diagnosis not present

## 2012-12-29 DIAGNOSIS — E119 Type 2 diabetes mellitus without complications: Secondary | ICD-10-CM | POA: Diagnosis not present

## 2012-12-29 DIAGNOSIS — S31809A Unspecified open wound of unspecified buttock, initial encounter: Secondary | ICD-10-CM | POA: Diagnosis not present

## 2012-12-29 DIAGNOSIS — L89609 Pressure ulcer of unspecified heel, unspecified stage: Secondary | ICD-10-CM | POA: Diagnosis not present

## 2013-01-01 DIAGNOSIS — L0231 Cutaneous abscess of buttock: Secondary | ICD-10-CM | POA: Diagnosis not present

## 2013-01-01 DIAGNOSIS — R269 Unspecified abnormalities of gait and mobility: Secondary | ICD-10-CM | POA: Diagnosis not present

## 2013-01-01 DIAGNOSIS — S31809A Unspecified open wound of unspecified buttock, initial encounter: Secondary | ICD-10-CM | POA: Diagnosis not present

## 2013-01-01 DIAGNOSIS — L89609 Pressure ulcer of unspecified heel, unspecified stage: Secondary | ICD-10-CM | POA: Diagnosis not present

## 2013-01-01 DIAGNOSIS — L8995 Pressure ulcer of unspecified site, unstageable: Secondary | ICD-10-CM | POA: Diagnosis not present

## 2013-01-01 DIAGNOSIS — E119 Type 2 diabetes mellitus without complications: Secondary | ICD-10-CM | POA: Diagnosis not present

## 2013-01-03 ENCOUNTER — Encounter (HOSPITAL_COMMUNITY): Payer: Medicare Other

## 2013-01-03 ENCOUNTER — Other Ambulatory Visit (HOSPITAL_COMMUNITY): Payer: Self-pay | Admitting: *Deleted

## 2013-01-03 DIAGNOSIS — L8995 Pressure ulcer of unspecified site, unstageable: Secondary | ICD-10-CM | POA: Diagnosis not present

## 2013-01-03 DIAGNOSIS — L89609 Pressure ulcer of unspecified heel, unspecified stage: Secondary | ICD-10-CM | POA: Diagnosis not present

## 2013-01-03 DIAGNOSIS — R269 Unspecified abnormalities of gait and mobility: Secondary | ICD-10-CM | POA: Diagnosis not present

## 2013-01-03 DIAGNOSIS — S31809A Unspecified open wound of unspecified buttock, initial encounter: Secondary | ICD-10-CM | POA: Diagnosis not present

## 2013-01-03 DIAGNOSIS — E119 Type 2 diabetes mellitus without complications: Secondary | ICD-10-CM | POA: Diagnosis not present

## 2013-01-03 DIAGNOSIS — L0231 Cutaneous abscess of buttock: Secondary | ICD-10-CM | POA: Diagnosis not present

## 2013-01-04 ENCOUNTER — Encounter (HOSPITAL_COMMUNITY)
Admission: RE | Admit: 2013-01-04 | Discharge: 2013-01-04 | Disposition: A | Discharge status: Home / Self Care | Payer: Medicare Other | Source: Ambulatory Visit | Attending: Nephrology | Admitting: Nephrology

## 2013-01-04 DIAGNOSIS — N183 Chronic kidney disease, stage 3 unspecified: Secondary | ICD-10-CM | POA: Insufficient documentation

## 2013-01-04 DIAGNOSIS — D638 Anemia in other chronic diseases classified elsewhere: Secondary | ICD-10-CM | POA: Insufficient documentation

## 2013-01-04 MED ORDER — SODIUM CHLORIDE 0.9 % IV SOLN
1020.0000 mg | Freq: Once | INTRAVENOUS | Status: AC
  Administered 2013-01-04: 1020 mg via INTRAVENOUS
  Filled 2013-01-04: qty 34

## 2013-01-04 MED ORDER — DARBEPOETIN ALFA-POLYSORBATE 100 MCG/0.5ML IJ SOLN: 100.0000 ug | INTRAMUSCULAR | Status: DC

## 2013-01-05 DIAGNOSIS — S31809A Unspecified open wound of unspecified buttock, initial encounter: Secondary | ICD-10-CM | POA: Diagnosis not present

## 2013-01-05 DIAGNOSIS — L89609 Pressure ulcer of unspecified heel, unspecified stage: Secondary | ICD-10-CM | POA: Diagnosis not present

## 2013-01-05 DIAGNOSIS — L8995 Pressure ulcer of unspecified site, unstageable: Secondary | ICD-10-CM | POA: Diagnosis not present

## 2013-01-05 DIAGNOSIS — R269 Unspecified abnormalities of gait and mobility: Secondary | ICD-10-CM | POA: Diagnosis not present

## 2013-01-05 DIAGNOSIS — L0231 Cutaneous abscess of buttock: Secondary | ICD-10-CM | POA: Diagnosis not present

## 2013-01-05 DIAGNOSIS — E119 Type 2 diabetes mellitus without complications: Secondary | ICD-10-CM | POA: Diagnosis not present

## 2013-01-08 DIAGNOSIS — N2581 Secondary hyperparathyroidism of renal origin: Secondary | ICD-10-CM | POA: Diagnosis not present

## 2013-01-09 DIAGNOSIS — S31809A Unspecified open wound of unspecified buttock, initial encounter: Secondary | ICD-10-CM | POA: Diagnosis not present

## 2013-01-09 DIAGNOSIS — L0231 Cutaneous abscess of buttock: Secondary | ICD-10-CM | POA: Diagnosis not present

## 2013-01-09 DIAGNOSIS — L89609 Pressure ulcer of unspecified heel, unspecified stage: Secondary | ICD-10-CM | POA: Diagnosis not present

## 2013-01-09 DIAGNOSIS — L8995 Pressure ulcer of unspecified site, unstageable: Secondary | ICD-10-CM | POA: Diagnosis not present

## 2013-01-09 DIAGNOSIS — R269 Unspecified abnormalities of gait and mobility: Secondary | ICD-10-CM | POA: Diagnosis not present

## 2013-01-09 DIAGNOSIS — E119 Type 2 diabetes mellitus without complications: Secondary | ICD-10-CM | POA: Diagnosis not present

## 2013-01-10 DIAGNOSIS — S72499A Other fracture of lower end of unspecified femur, initial encounter for closed fracture: Secondary | ICD-10-CM | POA: Diagnosis not present

## 2013-01-10 DIAGNOSIS — IMO0001 Reserved for inherently not codable concepts without codable children: Secondary | ICD-10-CM | POA: Diagnosis not present

## 2013-01-12 DIAGNOSIS — S31809A Unspecified open wound of unspecified buttock, initial encounter: Secondary | ICD-10-CM | POA: Diagnosis not present

## 2013-01-12 DIAGNOSIS — R269 Unspecified abnormalities of gait and mobility: Secondary | ICD-10-CM | POA: Diagnosis not present

## 2013-01-12 DIAGNOSIS — L0231 Cutaneous abscess of buttock: Secondary | ICD-10-CM | POA: Diagnosis not present

## 2013-01-12 DIAGNOSIS — L8995 Pressure ulcer of unspecified site, unstageable: Secondary | ICD-10-CM | POA: Diagnosis not present

## 2013-01-12 DIAGNOSIS — E119 Type 2 diabetes mellitus without complications: Secondary | ICD-10-CM | POA: Diagnosis not present

## 2013-01-12 DIAGNOSIS — L89609 Pressure ulcer of unspecified heel, unspecified stage: Secondary | ICD-10-CM | POA: Diagnosis not present

## 2013-01-15 DIAGNOSIS — L89609 Pressure ulcer of unspecified heel, unspecified stage: Secondary | ICD-10-CM | POA: Diagnosis not present

## 2013-01-15 DIAGNOSIS — R269 Unspecified abnormalities of gait and mobility: Secondary | ICD-10-CM | POA: Diagnosis not present

## 2013-01-15 DIAGNOSIS — E119 Type 2 diabetes mellitus without complications: Secondary | ICD-10-CM | POA: Diagnosis not present

## 2013-01-15 DIAGNOSIS — L8995 Pressure ulcer of unspecified site, unstageable: Secondary | ICD-10-CM | POA: Diagnosis not present

## 2013-01-15 DIAGNOSIS — S31809A Unspecified open wound of unspecified buttock, initial encounter: Secondary | ICD-10-CM | POA: Diagnosis not present

## 2013-01-15 DIAGNOSIS — L0231 Cutaneous abscess of buttock: Secondary | ICD-10-CM | POA: Diagnosis not present

## 2013-01-16 ENCOUNTER — Encounter (HOSPITAL_COMMUNITY)
Admission: RE | Admit: 2013-01-16 | Discharge: 2013-01-16 | Disposition: A | Discharge status: Home / Self Care | Payer: Medicare Other | Source: Ambulatory Visit | Attending: Nephrology | Admitting: Nephrology

## 2013-01-16 DIAGNOSIS — D638 Anemia in other chronic diseases classified elsewhere: Secondary | ICD-10-CM | POA: Diagnosis not present

## 2013-01-16 LAB — POCT HEMOGLOBIN-HEMACUE: Hemoglobin: 11.7 g/dL — ABNORMAL LOW (ref 12.0–15.0)

## 2013-01-16 MED ORDER — DARBEPOETIN ALFA-POLYSORBATE 100 MCG/0.5ML IJ SOLN: 100.0000 ug | INTRAMUSCULAR | Status: DC

## 2013-01-18 DIAGNOSIS — E119 Type 2 diabetes mellitus without complications: Secondary | ICD-10-CM | POA: Diagnosis not present

## 2013-01-18 DIAGNOSIS — L0231 Cutaneous abscess of buttock: Secondary | ICD-10-CM | POA: Diagnosis not present

## 2013-01-18 DIAGNOSIS — L8995 Pressure ulcer of unspecified site, unstageable: Secondary | ICD-10-CM | POA: Diagnosis not present

## 2013-01-18 DIAGNOSIS — L89609 Pressure ulcer of unspecified heel, unspecified stage: Secondary | ICD-10-CM | POA: Diagnosis not present

## 2013-01-18 DIAGNOSIS — S31809A Unspecified open wound of unspecified buttock, initial encounter: Secondary | ICD-10-CM | POA: Diagnosis not present

## 2013-01-18 DIAGNOSIS — R269 Unspecified abnormalities of gait and mobility: Secondary | ICD-10-CM | POA: Diagnosis not present

## 2013-01-19 ENCOUNTER — Ambulatory Visit (INDEPENDENT_AMBULATORY_CARE_PROVIDER_SITE_OTHER): Payer: Medicare Other | Admitting: General Surgery

## 2013-01-19 ENCOUNTER — Encounter (INDEPENDENT_AMBULATORY_CARE_PROVIDER_SITE_OTHER): Payer: Self-pay | Admitting: General Surgery

## 2013-01-19 VITALS — BP 160/90 | HR 68 | Temp 98.2°F | Resp 14 | Ht 62.0 in | Wt 168.8 lb

## 2013-01-19 DIAGNOSIS — L89153 Pressure ulcer of sacral region, stage 3: Secondary | ICD-10-CM

## 2013-01-19 DIAGNOSIS — L8993 Pressure ulcer of unspecified site, stage 3: Secondary | ICD-10-CM

## 2013-01-19 DIAGNOSIS — L89109 Pressure ulcer of unspecified part of back, unspecified stage: Secondary | ICD-10-CM | POA: Diagnosis not present

## 2013-01-19 NOTE — Patient Instructions (Signed)
Remove the bandage and packing on Sunday morning.  Clean area in the shower and apply a dry dressing daily until nurses start coming and doing wound care.

## 2013-01-19 NOTE — Progress Notes (Signed)
Patient ID: [name], female   DOB: 24-Aug-1945, 67 y.o.   MRN: [id number]  Chief Complaint  Patient presents with  . New Evaluation    eval abscess on buttocks    HPI [name] is a 67 y.o. female.  HPIShe is self-referred. She had a femur fracture and was in the nursing home for a long period of time. She developed a decubitus wound in the sacral area. It is nonhealing. She has been to the wound center but is not satisfied with them. Currently, her placing a dressing on a twice a week. It has some odiferous drainage coming from it. She is a diabetic but states her sugars are under good control.  Past Medical History  Diagnosis Date  . Hypertension   . Hyperlipidemia   . GERD (gastroesophageal reflux disease)   . GAD (generalized anxiety disorder)   . GIB (gastrointestinal bleeding)   . CKD (chronic kidney disease), stage III   . Peripheral edema   . Anemia   . Colon polyps   . Coronary artery disease   . Hypothyroidism   . Type II diabetes mellitus   . History of blood transfusion 10/11/2012    "2 units yesterday; first time ever" (10/12/2012)  . Arthritis     "knees" (10/12/2012)  . Gout   . Anxiety     Past Surgical History  Procedure Laterality Date  . Incision and drainage perirectal abscess  ~ 2007  . Retinopathy surgery Bilateral 1980's?  . Orif femur fracture Left 10/09/2012    Procedure: OPEN REDUCTION INTERNAL FIXATION (ORIF) DISTAL FEMUR FRACTURE;  Surgeon: Rozanna Box, MD;  Location: [facility];  Service: Orthopedics;  Laterality: Left;    Family History  Problem Relation Age of Onset  . Cancer Father   . Colon cancer Father   . Prostate cancer Father   . Diabetes Father   . Coronary artery disease Mother 68  . Heart disease Mother   . Diabetes Mother   . Coronary artery disease Sister 9  . Diabetes Sister   . Diabetes Maternal Grandmother   . Diabetes Sister   . Diabetes Sister   . Diabetes Sister   . Diabetes Sister   . Diabetes Sister      Social History History  Substance Use Topics  . Smoking status: Never Smoker   . Smokeless tobacco: Never Used  . Alcohol Use: No    Allergies  Allergen Reactions  . Aspirin     vomiting  . Ciprofloxacin     unknown  . Codeine     Makes me sick   . Micardis [Telmisartan]     unknown  . Rofecoxib     unknown    Current Outpatient Prescriptions  Medication Sig Dispense Refill  . atorvastatin (LIPITOR) 40 MG tablet Take 1 tablet (40 mg total) by mouth daily.  90 tablet  1  . cloNIDine (CATAPRES) 0.2 MG tablet Take 1 tablet (0.2 mg total) by mouth 2 (two) times daily.  90 tablet  1  . furosemide (LASIX) 40 MG tablet       . glimepiride (AMARYL) 2 MG tablet TAKE 1 TABLET EVERY DAY  90 tablet  3  . levothyroxine (SYNTHROID, LEVOTHROID) 75 MCG tablet Take 1 tablet (75 mcg total) by mouth daily before breakfast.  90 tablet  1  . losartan (COZAAR) 100 MG tablet       . metoCLOPramide (REGLAN) 5 MG tablet Take 1 tablet (5 mg total)  by mouth 4 (four) times daily.  360 tablet  1  . metoprolol (TOPROL-XL) 50 MG 24 hr tablet Take 50 mg by mouth daily.        . pantoprazole (PROTONIX) 40 MG tablet Take 1 tablet (40 mg total) by mouth 2 (two) times daily.  180 tablet  1  . pioglitazone (ACTOS) 30 MG tablet Take 1 tablet (30 mg total) by mouth daily.  90 tablet  1  . SPS 15 GM/60ML suspension        No current facility-administered medications for this visit.    Review of Systems Review of Systems  Cardiovascular: Positive for leg swelling.  Musculoskeletal: Positive for arthralgias.    Blood pressure 160/90, pulse 68, temperature 98.2 F (36.8 C), temperature source Temporal, resp. rate 14, height 5\' 2"  (1.575 m), weight 168 lb 12.8 oz (76.567 kg).  Physical Exam Physical Exam  Constitutional:  Overweight female  Musculoskeletal:  Just superior to the anus in the gluteal cleft area and deviating to the right is an open wound with exposed granulation tissue. No purulent  drainage. Digital rectal exam did not demonstrate any palpable abnormalities. I probed the wound and did not detect any obvious fistula.    Data Reviewed none  Assessment    Nonhealing sacral wound.     Plan    Again no saline damp to dry dressing changes. We'll arrange for home health nursing to come assist with ease. Return visit one month.        Jaydin Jalomo J 01/19/2013, 3:41 PM

## 2013-01-20 ENCOUNTER — Telehealth (INDEPENDENT_AMBULATORY_CARE_PROVIDER_SITE_OTHER): Payer: Self-pay | Admitting: General Surgery

## 2013-01-20 MED ORDER — SALINE SOLN: 1.0000 "application " | Freq: Every day | Status: DC

## 2013-01-20 NOTE — Telephone Encounter (Signed)
Pt called stating pharmacy unable to make saline.  Need Rx for premade bottle.  Rx sent electronically

## 2013-01-21 DIAGNOSIS — L0231 Cutaneous abscess of buttock: Secondary | ICD-10-CM

## 2013-01-21 DIAGNOSIS — S31809A Unspecified open wound of unspecified buttock, initial encounter: Secondary | ICD-10-CM | POA: Diagnosis not present

## 2013-01-21 DIAGNOSIS — L03317 Cellulitis of buttock: Secondary | ICD-10-CM

## 2013-01-23 ENCOUNTER — Other Ambulatory Visit (INDEPENDENT_AMBULATORY_CARE_PROVIDER_SITE_OTHER): Payer: Self-pay | Admitting: General Surgery

## 2013-01-23 DIAGNOSIS — L89609 Pressure ulcer of unspecified heel, unspecified stage: Secondary | ICD-10-CM | POA: Diagnosis not present

## 2013-01-23 DIAGNOSIS — L0231 Cutaneous abscess of buttock: Secondary | ICD-10-CM | POA: Diagnosis not present

## 2013-01-23 DIAGNOSIS — L89153 Pressure ulcer of sacral region, stage 3: Secondary | ICD-10-CM

## 2013-01-23 DIAGNOSIS — E119 Type 2 diabetes mellitus without complications: Secondary | ICD-10-CM | POA: Diagnosis not present

## 2013-01-23 DIAGNOSIS — L8995 Pressure ulcer of unspecified site, unstageable: Secondary | ICD-10-CM | POA: Diagnosis not present

## 2013-01-23 DIAGNOSIS — R269 Unspecified abnormalities of gait and mobility: Secondary | ICD-10-CM | POA: Diagnosis not present

## 2013-01-23 DIAGNOSIS — S31809A Unspecified open wound of unspecified buttock, initial encounter: Secondary | ICD-10-CM | POA: Diagnosis not present

## 2013-01-24 ENCOUNTER — Telehealth (INDEPENDENT_AMBULATORY_CARE_PROVIDER_SITE_OTHER): Payer: Self-pay

## 2013-01-24 DIAGNOSIS — L89609 Pressure ulcer of unspecified heel, unspecified stage: Secondary | ICD-10-CM | POA: Diagnosis not present

## 2013-01-24 DIAGNOSIS — R269 Unspecified abnormalities of gait and mobility: Secondary | ICD-10-CM | POA: Diagnosis not present

## 2013-01-24 DIAGNOSIS — L8995 Pressure ulcer of unspecified site, unstageable: Secondary | ICD-10-CM | POA: Diagnosis not present

## 2013-01-24 DIAGNOSIS — E119 Type 2 diabetes mellitus without complications: Secondary | ICD-10-CM | POA: Diagnosis not present

## 2013-01-24 DIAGNOSIS — S31809A Unspecified open wound of unspecified buttock, initial encounter: Secondary | ICD-10-CM | POA: Diagnosis not present

## 2013-01-24 DIAGNOSIS — L0231 Cutaneous abscess of buttock: Secondary | ICD-10-CM | POA: Diagnosis not present

## 2013-01-24 NOTE — Telephone Encounter (Signed)
Spoke with the patient today.  She says her sister has been changing her wound dressings.  We are still arranging home health care for her.  I told her they would only come out every other day since she is Medicare.  She was fine with that.  Will contact her as soon as HH is established.

## 2013-01-25 ENCOUNTER — Telehealth (INDEPENDENT_AMBULATORY_CARE_PROVIDER_SITE_OTHER): Payer: Self-pay

## 2013-01-25 NOTE — Telephone Encounter (Signed)
Spoke with Malachy Mood, RN with [facility].  She will schedule a nurse visit for this patient for wound care.  Pt to be made aware.

## 2013-01-27 DIAGNOSIS — L89609 Pressure ulcer of unspecified heel, unspecified stage: Secondary | ICD-10-CM | POA: Diagnosis not present

## 2013-01-27 DIAGNOSIS — R269 Unspecified abnormalities of gait and mobility: Secondary | ICD-10-CM | POA: Diagnosis not present

## 2013-01-27 DIAGNOSIS — S31809A Unspecified open wound of unspecified buttock, initial encounter: Secondary | ICD-10-CM | POA: Diagnosis not present

## 2013-01-27 DIAGNOSIS — L8995 Pressure ulcer of unspecified site, unstageable: Secondary | ICD-10-CM | POA: Diagnosis not present

## 2013-01-27 DIAGNOSIS — L0231 Cutaneous abscess of buttock: Secondary | ICD-10-CM | POA: Diagnosis not present

## 2013-01-27 DIAGNOSIS — E119 Type 2 diabetes mellitus without complications: Secondary | ICD-10-CM | POA: Diagnosis not present

## 2013-01-29 DIAGNOSIS — L89609 Pressure ulcer of unspecified heel, unspecified stage: Secondary | ICD-10-CM | POA: Diagnosis not present

## 2013-01-29 DIAGNOSIS — L0231 Cutaneous abscess of buttock: Secondary | ICD-10-CM | POA: Diagnosis not present

## 2013-01-29 DIAGNOSIS — S31809A Unspecified open wound of unspecified buttock, initial encounter: Secondary | ICD-10-CM | POA: Diagnosis not present

## 2013-01-29 DIAGNOSIS — L8995 Pressure ulcer of unspecified site, unstageable: Secondary | ICD-10-CM | POA: Diagnosis not present

## 2013-01-29 DIAGNOSIS — R269 Unspecified abnormalities of gait and mobility: Secondary | ICD-10-CM | POA: Diagnosis not present

## 2013-01-29 DIAGNOSIS — E119 Type 2 diabetes mellitus without complications: Secondary | ICD-10-CM | POA: Diagnosis not present

## 2013-01-30 ENCOUNTER — Encounter (HOSPITAL_COMMUNITY)
Admission: RE | Admit: 2013-01-30 | Discharge: 2013-01-30 | Disposition: A | Discharge status: Home / Self Care | Payer: Medicare Other | Source: Ambulatory Visit | Attending: Nephrology | Admitting: Nephrology

## 2013-01-30 DIAGNOSIS — N183 Chronic kidney disease, stage 3 unspecified: Secondary | ICD-10-CM | POA: Insufficient documentation

## 2013-01-30 DIAGNOSIS — D638 Anemia in other chronic diseases classified elsewhere: Secondary | ICD-10-CM | POA: Insufficient documentation

## 2013-01-30 LAB — POCT HEMOGLOBIN-HEMACUE: Hemoglobin: 10.5 g/dL — ABNORMAL LOW (ref 12.0–15.0)

## 2013-01-30 MED ORDER — DARBEPOETIN ALFA-POLYSORBATE 100 MCG/0.5ML IJ SOLN
100.0000 ug | INTRAMUSCULAR | Status: DC
  Administered 2013-01-30: 100 ug via SUBCUTANEOUS
  Filled 2013-01-30: qty 0.5

## 2013-01-31 DIAGNOSIS — L8995 Pressure ulcer of unspecified site, unstageable: Secondary | ICD-10-CM | POA: Diagnosis not present

## 2013-01-31 DIAGNOSIS — R269 Unspecified abnormalities of gait and mobility: Secondary | ICD-10-CM | POA: Diagnosis not present

## 2013-01-31 DIAGNOSIS — L0231 Cutaneous abscess of buttock: Secondary | ICD-10-CM | POA: Diagnosis not present

## 2013-01-31 DIAGNOSIS — L89609 Pressure ulcer of unspecified heel, unspecified stage: Secondary | ICD-10-CM | POA: Diagnosis not present

## 2013-01-31 DIAGNOSIS — E119 Type 2 diabetes mellitus without complications: Secondary | ICD-10-CM | POA: Diagnosis not present

## 2013-01-31 DIAGNOSIS — S31809A Unspecified open wound of unspecified buttock, initial encounter: Secondary | ICD-10-CM | POA: Diagnosis not present

## 2013-02-02 DIAGNOSIS — L8995 Pressure ulcer of unspecified site, unstageable: Secondary | ICD-10-CM | POA: Diagnosis not present

## 2013-02-02 DIAGNOSIS — L89609 Pressure ulcer of unspecified heel, unspecified stage: Secondary | ICD-10-CM | POA: Diagnosis not present

## 2013-02-02 DIAGNOSIS — S31809A Unspecified open wound of unspecified buttock, initial encounter: Secondary | ICD-10-CM | POA: Diagnosis not present

## 2013-02-02 DIAGNOSIS — R269 Unspecified abnormalities of gait and mobility: Secondary | ICD-10-CM | POA: Diagnosis not present

## 2013-02-02 DIAGNOSIS — L0231 Cutaneous abscess of buttock: Secondary | ICD-10-CM | POA: Diagnosis not present

## 2013-02-02 DIAGNOSIS — E119 Type 2 diabetes mellitus without complications: Secondary | ICD-10-CM | POA: Diagnosis not present

## 2013-02-05 DIAGNOSIS — S31809A Unspecified open wound of unspecified buttock, initial encounter: Secondary | ICD-10-CM | POA: Diagnosis not present

## 2013-02-05 DIAGNOSIS — L8995 Pressure ulcer of unspecified site, unstageable: Secondary | ICD-10-CM | POA: Diagnosis not present

## 2013-02-05 DIAGNOSIS — L89609 Pressure ulcer of unspecified heel, unspecified stage: Secondary | ICD-10-CM | POA: Diagnosis not present

## 2013-02-05 DIAGNOSIS — L0231 Cutaneous abscess of buttock: Secondary | ICD-10-CM | POA: Diagnosis not present

## 2013-02-05 DIAGNOSIS — R269 Unspecified abnormalities of gait and mobility: Secondary | ICD-10-CM | POA: Diagnosis not present

## 2013-02-05 DIAGNOSIS — E119 Type 2 diabetes mellitus without complications: Secondary | ICD-10-CM | POA: Diagnosis not present

## 2013-02-06 DIAGNOSIS — I129 Hypertensive chronic kidney disease with stage 1 through stage 4 chronic kidney disease, or unspecified chronic kidney disease: Secondary | ICD-10-CM | POA: Diagnosis not present

## 2013-02-06 DIAGNOSIS — I509 Heart failure, unspecified: Secondary | ICD-10-CM | POA: Diagnosis not present

## 2013-02-06 DIAGNOSIS — L98499 Non-pressure chronic ulcer of skin of other sites with unspecified severity: Secondary | ICD-10-CM | POA: Diagnosis not present

## 2013-02-06 DIAGNOSIS — I2510 Atherosclerotic heart disease of native coronary artery without angina pectoris: Secondary | ICD-10-CM | POA: Diagnosis not present

## 2013-02-06 DIAGNOSIS — E119 Type 2 diabetes mellitus without complications: Secondary | ICD-10-CM | POA: Diagnosis not present

## 2013-02-07 DIAGNOSIS — L98499 Non-pressure chronic ulcer of skin of other sites with unspecified severity: Secondary | ICD-10-CM | POA: Diagnosis not present

## 2013-02-07 DIAGNOSIS — I509 Heart failure, unspecified: Secondary | ICD-10-CM | POA: Diagnosis not present

## 2013-02-07 DIAGNOSIS — I129 Hypertensive chronic kidney disease with stage 1 through stage 4 chronic kidney disease, or unspecified chronic kidney disease: Secondary | ICD-10-CM | POA: Diagnosis not present

## 2013-02-07 DIAGNOSIS — I2510 Atherosclerotic heart disease of native coronary artery without angina pectoris: Secondary | ICD-10-CM | POA: Diagnosis not present

## 2013-02-07 DIAGNOSIS — E119 Type 2 diabetes mellitus without complications: Secondary | ICD-10-CM | POA: Diagnosis not present

## 2013-02-09 DIAGNOSIS — E119 Type 2 diabetes mellitus without complications: Secondary | ICD-10-CM | POA: Diagnosis not present

## 2013-02-09 DIAGNOSIS — I509 Heart failure, unspecified: Secondary | ICD-10-CM | POA: Diagnosis not present

## 2013-02-09 DIAGNOSIS — L98499 Non-pressure chronic ulcer of skin of other sites with unspecified severity: Secondary | ICD-10-CM | POA: Diagnosis not present

## 2013-02-09 DIAGNOSIS — I129 Hypertensive chronic kidney disease with stage 1 through stage 4 chronic kidney disease, or unspecified chronic kidney disease: Secondary | ICD-10-CM | POA: Diagnosis not present

## 2013-02-09 DIAGNOSIS — I2510 Atherosclerotic heart disease of native coronary artery without angina pectoris: Secondary | ICD-10-CM | POA: Diagnosis not present

## 2013-02-12 ENCOUNTER — Encounter (INDEPENDENT_AMBULATORY_CARE_PROVIDER_SITE_OTHER): Payer: Self-pay | Admitting: General Surgery

## 2013-02-12 ENCOUNTER — Ambulatory Visit (INDEPENDENT_AMBULATORY_CARE_PROVIDER_SITE_OTHER): Payer: Medicare Other | Admitting: General Surgery

## 2013-02-12 ENCOUNTER — Encounter (HOSPITAL_COMMUNITY)
Admission: RE | Admit: 2013-02-12 | Discharge: 2013-02-12 | Disposition: A | Discharge status: Home / Self Care | Payer: Medicare Other | Source: Ambulatory Visit | Attending: Nephrology | Admitting: Nephrology

## 2013-02-12 VITALS — BP 130/74 | HR 68 | Resp 16 | Ht 62.0 in | Wt 169.2 lb

## 2013-02-12 DIAGNOSIS — L89153 Pressure ulcer of sacral region, stage 3: Secondary | ICD-10-CM

## 2013-02-12 DIAGNOSIS — L8993 Pressure ulcer of unspecified site, stage 3: Secondary | ICD-10-CM | POA: Diagnosis not present

## 2013-02-12 DIAGNOSIS — L89109 Pressure ulcer of unspecified part of back, unspecified stage: Secondary | ICD-10-CM

## 2013-02-12 DIAGNOSIS — D638 Anemia in other chronic diseases classified elsewhere: Secondary | ICD-10-CM | POA: Diagnosis not present

## 2013-02-12 MED ORDER — DARBEPOETIN ALFA-POLYSORBATE 100 MCG/0.5ML IJ SOLN: 100.0000 ug | INTRAMUSCULAR | Status: DC

## 2013-02-12 NOTE — Progress Notes (Signed)
She is here for a wound check. She states her blood sugars are in good control. She's having him to dry dressing changes done to the lower sacral wound.  On exam, she is in no acute distress and looks well.  In the lower gluteal cleft area is an open wound is clean. There is packing in the morning to try removed. Good granulation tissue is present. No purulent drainage. No cellulitis.   Assessment: Slowly healing small sacral decubitus ulcer.  Plan: Keep blood sugars less than 200. Continue dressing changes with a light tan dressing in the bed of the wound. Return visit one month. I did explain to them that this could take a very long time to heal or it may not heal I am not sure she is a candidate for flap closure if it does not heal but this would need a referral to a plastic surgeon

## 2013-02-12 NOTE — Patient Instructions (Signed)
Continue current dressing changes. Keep blood sugars less than 200.

## 2013-02-13 ENCOUNTER — Encounter (HOSPITAL_COMMUNITY): Payer: Medicare Other

## 2013-02-16 ENCOUNTER — Ambulatory Visit (INDEPENDENT_AMBULATORY_CARE_PROVIDER_SITE_OTHER): Payer: Medicare Other | Admitting: Nurse Practitioner

## 2013-02-16 ENCOUNTER — Encounter: Payer: Self-pay | Admitting: Nurse Practitioner

## 2013-02-16 VITALS — BP 141/70 | HR 62 | Temp 97.2°F | Ht 62.0 in | Wt 166.0 lb

## 2013-02-16 DIAGNOSIS — E785 Hyperlipidemia, unspecified: Secondary | ICD-10-CM

## 2013-02-16 DIAGNOSIS — Z23 Encounter for immunization: Secondary | ICD-10-CM

## 2013-02-16 DIAGNOSIS — E119 Type 2 diabetes mellitus without complications: Secondary | ICD-10-CM | POA: Diagnosis not present

## 2013-02-16 DIAGNOSIS — I10 Essential (primary) hypertension: Secondary | ICD-10-CM | POA: Diagnosis not present

## 2013-02-16 MED ORDER — FUROSEMIDE 40 MG PO TABS: 40.0000 mg | ORAL_TABLET | Freq: Every day | ORAL | Status: DC

## 2013-02-16 MED ORDER — LOSARTAN POTASSIUM 100 MG PO TABS: 100.0000 mg | ORAL_TABLET | Freq: Every day | ORAL | Status: DC

## 2013-02-16 MED ORDER — METOPROLOL SUCCINATE ER 50 MG PO TB24: 50.0000 mg | ORAL_TABLET | Freq: Every day | ORAL | Status: DC

## 2013-02-16 MED ORDER — CLONIDINE HCL 0.2 MG PO TABS: 0.2000 mg | ORAL_TABLET | Freq: Two times a day (BID) | ORAL | Status: DC

## 2013-02-16 MED ORDER — ATORVASTATIN CALCIUM 40 MG PO TABS: 40.0000 mg | ORAL_TABLET | Freq: Every day | ORAL | Status: DC

## 2013-02-16 MED ORDER — METOCLOPRAMIDE HCL 5 MG PO TABS: 5.0000 mg | ORAL_TABLET | Freq: Four times a day (QID) | ORAL | Status: DC

## 2013-02-16 MED ORDER — GLIMEPIRIDE 2 MG PO TABS: 2.0000 mg | ORAL_TABLET | Freq: Every day | ORAL | Status: DC

## 2013-02-16 MED ORDER — CALCITRIOL 0.25 MCG PO CAPS: 0.2500 ug | ORAL_CAPSULE | Freq: Every day | ORAL | Status: DC

## 2013-02-16 MED ORDER — PIOGLITAZONE HCL 30 MG PO TABS: 30.0000 mg | ORAL_TABLET | Freq: Every day | ORAL | Status: DC

## 2013-02-16 MED ORDER — LEVOTHYROXINE SODIUM 75 MCG PO TABS: 75.0000 ug | ORAL_TABLET | Freq: Every day | ORAL | Status: DC

## 2013-02-16 MED ORDER — SODIUM POLYSTYRENE SULFONATE 15 GM/60ML PO SUSP: 15.0000 g | Freq: Once | ORAL | Status: DC

## 2013-02-16 MED ORDER — PANTOPRAZOLE SODIUM 40 MG PO TBEC: 40.0000 mg | DELAYED_RELEASE_TABLET | Freq: Two times a day (BID) | ORAL | Status: DC

## 2013-02-16 NOTE — Progress Notes (Signed)
Subjective:    Patient ID: [name], female    DOB: [birth year], 67 y.o.   MRN: [id number]  Hyperlipidemia This is a chronic problem. The current episode started more than 1 year ago. The problem is uncontrolled. Recent lipid tests were reviewed and are high. Exacerbating diseases include chronic renal disease, diabetes and hypothyroidism. Factors aggravating her hyperlipidemia include beta blockers. Pertinent negatives include no chest pain, leg pain, myalgias or shortness of breath. Current antihyperlipidemic treatment includes statins. The current treatment provides moderate improvement of lipids. Risk factors for coronary artery disease include diabetes mellitus, dyslipidemia, family history, hypertension, obesity and post-menopausal.  Hypertension This is a chronic problem. The current episode started more than 1 year ago. The problem has been waxing and waning since onset. The problem is uncontrolled. Pertinent negatives include no anxiety, blurred vision, chest pain, headaches, palpitations, peripheral edema or shortness of breath. Risk factors for coronary artery disease include diabetes mellitus, dyslipidemia, family history and post-menopausal state. Past treatments include beta blockers. The current treatment provides mild improvement. Hypertensive end-organ damage includes kidney disease and a thyroid problem. Identifiable causes of hypertension include chronic renal disease.  Diabetes She presents for her follow-up diabetic visit. She has type 2 diabetes mellitus. There are no hypoglycemic associated symptoms. Pertinent negatives for hypoglycemia include no headaches. Associated symptoms include visual change. Pertinent negatives for diabetes include no blurred vision, no chest pain, no fatigue, no foot paresthesias, no foot ulcerations, no polydipsia and no polyphagia. There are no hypoglycemic complications. There are no diabetic complications. Risk factors for coronary artery disease  include diabetes mellitus, dyslipidemia, hypertension and post-menopausal. Current diabetic treatment includes oral agent (monotherapy). She is compliant with treatment all of the time. Her weight is stable. She is following a diabetic diet. She has not had a previous visit with a dietician. She participates in exercise daily. Her breakfast blood glucose is taken between 8-9 am. Her breakfast blood glucose range is generally 90-110 mg/dl. An ACE inhibitor/angiotensin II receptor blocker is not being taken. She does not see a podiatrist.Eye exam is current (March 2014).  Gastrophageal Reflux She reports no chest pain, no coughing, no heartburn, no nausea or no sore throat. This is a chronic problem. The current episode started more than 1 year ago. The problem occurs rarely. The problem has been resolved. The symptoms are aggravated by certain foods. Pertinent negatives include no fatigue or muscle weakness. She has tried a PPI for the symptoms. The treatment provided significant relief.  Thyroid Problem Presents for follow-up visit. Symptoms include dry skin, hair loss and visual change. Patient reports no constipation, depressed mood, diaphoresis, fatigue, leg swelling or palpitations. The symptoms have been stable. Past treatments include levothyroxine. The treatment provided significant relief. Her past medical history is significant for diabetes and hyperlipidemia.   * Patient fell in May and fracutured er left femur- she had rod inserted and had to stay in nursing home for awhile. Did not have good control of blood sugars while she was there so HgbA1c may be elevated  Review of Systems  Constitutional: Negative for diaphoresis and fatigue.  HENT: Negative for sore throat.   Eyes: Negative for blurred vision.  Respiratory: Negative for cough and shortness of breath.   Cardiovascular: Negative for chest pain and palpitations.  Gastrointestinal: Negative for heartburn, nausea and constipation.   Endocrine: Negative for polydipsia and polyphagia.  Musculoskeletal: Negative for myalgias and muscle weakness.  Neurological: Negative for headaches.  All other systems reviewed and are negative.  Objective:   Physical Exam  Vitals reviewed. Constitutional: She is oriented to person, place, and time. She appears well-developed and well-nourished.  HENT:  Head: Normocephalic.  Right Ear: External ear normal.  Mouth/Throat: Oropharynx is clear and moist.  Eyes: Pupils are equal, round, and reactive to light.  Neck: Normal range of motion. Neck supple. No thyromegaly present.  Cardiovascular: Normal rate, regular rhythm, normal heart sounds and intact distal pulses.   Pulmonary/Chest: Effort normal and breath sounds normal.  Abdominal: Soft. Bowel sounds are normal. She exhibits no distension. There is no tenderness.  Musculoskeletal: Normal range of motion. She exhibits edema (trace amt bil ankles).  Pt broke femur May 2014   Neurological: She is alert and oriented to person, place, and time.  Skin: Skin is warm and dry.  Psychiatric: She has a normal mood and affect. Her behavior is normal. Judgment and thought content normal.     BP 141/70  Pulse 62  Temp(Src) 97.2 F (36.2 C) (Oral)  Ht 5\' 2"  (1.575 m)  Wt 166 lb (75.297 kg)  BMI 30.35 kg/m2  Results for orders placed in visit on 02/16/13  POCT GLYCOSYLATED HEMOGLOBIN (HGB A1C)      Result Value Range   Hemoglobin A1C 8.9          Assessment & Plan:   1. DM   2. HTN (hypertension)   3. Other and unspecified hyperlipidemia   4. GERD 5.Kidney disease  6.S/P femur fracture 7.Hypothyroidism  Orders Placed This Encounter  Procedures  . CMP14+EGFR  . NMR, lipoprofile  . POCT glycosylated hemoglobin (Hb A1C)  . POCT UA - Microalbumin   Meds ordered this encounter  Medications  . atorvastatin (LIPITOR) 40 MG tablet    Sig: Take 1 tablet (40 mg total) by mouth daily.    Dispense:  90 tablet     Refill:  1  . calcitRIOL (ROCALTROL) 0.25 MCG capsule    Sig: Take 1 capsule (0.25 mcg total) by mouth daily.    Dispense:  90 capsule    Refill:  1  . cloNIDine (CATAPRES) 0.2 MG tablet    Sig: Take 1 tablet (0.2 mg total) by mouth 2 (two) times daily.    Dispense:  90 tablet    Refill:  1  . furosemide (LASIX) 40 MG tablet    Sig: Take 1 tablet (40 mg total) by mouth daily.    Dispense:  90 tablet    Refill:  1  . glimepiride (AMARYL) 2 MG tablet    Sig: Take 1 tablet (2 mg total) by mouth daily before breakfast.    Dispense:  90 tablet    Refill:  1  . levothyroxine (SYNTHROID, LEVOTHROID) 75 MCG tablet    Sig: Take 1 tablet (75 mcg total) by mouth daily before breakfast.    Dispense:  90 tablet    Refill:  1  . losartan (COZAAR) 100 MG tablet    Sig: Take 1 tablet (100 mg total) by mouth daily.    Dispense:  90 tablet    Refill:  1  . metoCLOPramide (REGLAN) 5 MG tablet    Sig: Take 1 tablet (5 mg total) by mouth 4 (four) times daily.    Dispense:  360 tablet    Refill:  1  . metoprolol succinate (TOPROL-XL) 50 MG 24 hr tablet    Sig: Take 1 tablet (50 mg total) by mouth daily.    Dispense:  90 tablet    Refill:  1  . pantoprazole (PROTONIX) 40 MG tablet    Sig: Take 1 tablet (40 mg total) by mouth 2 (two) times daily.    Dispense:  180 tablet    Refill:  1  . pioglitazone (ACTOS) 30 MG tablet    Sig: Take 1 tablet (30 mg total) by mouth daily.    Dispense:  90 tablet    Refill:  1  . sodium polystyrene (SPS) 15 GM/60ML suspension    Sig: Take 60 mLs (15 g total) by mouth once.    Dispense:  500 mL    Refill:  1    Continue all meds Labs pending Diet and exercise encouraged Health maintenance reviewed Follow up in 3 months Flu shot today Keep diary of blood sugars - if aren't improving let me know  Mary-Margaret Hassell Done, FNP

## 2013-02-16 NOTE — Patient Instructions (Addendum)
Basic Carbohydrate Counting Basic carbohydrate counting is a way to plan meals. It is done by counting the amount of carbohydrate in foods. Foods that have carbohydrates are starches (grains, beans, starchy vegetables) and sweets. Eating carbohydrates increases blood glucose (sugar) levels. People with diabetes use carbohydrate counting to help keep their blood glucose at a normal level.  COUNTING CARBOHYDRATES IN FOODS The first step in counting carbohydrates is to learn how many carbohydrate servings you should have in every meal. A dietitian can plan this for you. After learning the amount of carbohydrates to include in your meal plan, you can start to choose the carbohydrate-containing foods you want to eat.  There are 2 ways to identify the amount of carbohydrates in the foods you eat.  Read the Nutrition Facts panel on food labels. You need 2 pieces of information from the Nutrition Facts panel to count carbohydrates this way:  Serving size.  Total carbohydrate (in grams). Decide how many servings you will be eating. If it is 1 serving, you will be eating the amount of carbohydrate listed on the panel. If you will be eating 2 servings, you will be eating double the amount of carbohydrate listed on the panel.   Learn serving sizes. A serving size of most carbohydrate-containing foods is about 15 grams (g). Listed below are single serving sizes of common carbohydrate-containing foods:  1 slice bread.   cup unsweetened, dry cereal.   cup hot cereal.   cup rice.   cup mashed potatoes.   cup pasta.  1 cup fresh fruit.   cup canned fruit.  1 cup milk (whole, 2%, or skim).   cup starchy vegetables (peas, corn, or potatoes). Counting carbohydrates this way is similar to looking on the Nutrition Facts panel. Decide how many servings you will eat first. Multiply the number of servings you eat by 15 g. For example, if you have 2 cups of strawberries, you had 2 servings. That means  you had 30 g of carbohydrate (2 servings x 15 g = 30 g). CALCULATING CARBOHYDRATES IN A MEAL Sample dinner  3 oz chicken breast.   cup brown rice.   cup corn.  1 cup fat-free milk.  1 cup strawberries with sugar-free whipped topping. Carbohydrate calculation First, identify the foods that contain carbohydrate:  Rice.  Corn.  Milk.  Strawberries. Calculate the number of servings eaten:  2 servings rice.  1 serving corn.  1 serving milk.  1 serving strawberries. Multiply the number of servings by 15 g:  2 servings rice x 15 g = 30 g.  1 serving corn x 15 g = 15 g.  1 serving milk x 15 g = 15 g.  1 serving strawberries x 15 g = 15 g. Add the amounts to find the total carbohydrates eaten: 30 g + 15 g + 15 g + 15 g = 75 g carbohydrate eaten at dinner. Document Released: 05/10/2005 Document Revised: 08/02/2011 Document Reviewed: 03/26/2011 [facility] Patient Information 2014 [place], [place].   Influenza Virus Vaccine injection (Fluarix) What is this medicine? INFLUENZA VIRUS VACCINE (in floo EN zuh VAHY ruhs vak SEEN) helps to reduce the risk of getting influenza also known as the flu. This medicine may be used for other purposes; ask your health care provider or pharmacist if you have questions. What should I tell my health care provider before I take this medicine? They need to know if you have any of these conditions: -bleeding disorder like hemophilia -fever or infection -Guillain-Barre syndrome  or other neurological problems -immune system problems -infection with the human immunodeficiency virus (HIV) or AIDS -low blood platelet counts -multiple sclerosis -an unusual or allergic reaction to influenza virus vaccine, eggs, chicken proteins, latex, gentamicin, other medicines, foods, dyes or preservatives -pregnant or trying to get pregnant -breast-feeding How should I use this medicine? This vaccine is for injection into a muscle. It is given by a health  care professional. A copy of Vaccine Information Statements will be given before each vaccination. Read this sheet carefully each time. The sheet may change frequently. Talk to your pediatrician regarding the use of this medicine in children. Special care may be needed. Overdosage: If you think you have taken too much of this medicine contact a poison control center or emergency room at once. NOTE: This medicine is only for you. Do not share this medicine with others. What if I miss a dose? This does not apply. What may interact with this medicine? -chemotherapy or radiation therapy -medicines that lower your immune system like etanercept, anakinra, infliximab, and adalimumab -medicines that treat or prevent blood clots like warfarin -phenytoin -steroid medicines like prednisone or cortisone -theophylline -vaccines This list may not describe all possible interactions. Give your health care provider a list of all the medicines, herbs, non-prescription drugs, or dietary supplements you use. Also tell them if you smoke, drink alcohol, or use illegal drugs. Some items may interact with your medicine. What should I watch for while using this medicine? Report any side effects that do not go away within 3 days to your doctor or health care professional. Call your health care provider if any unusual symptoms occur within 6 weeks of receiving this vaccine. You may still catch the flu, but the illness is not usually as bad. You cannot get the flu from the vaccine. The vaccine will not protect against colds or other illnesses that may cause fever. The vaccine is needed every year. What side effects may I notice from receiving this medicine? Side effects that you should report to your doctor or health care professional as soon as possible: -allergic reactions like skin rash, itching or hives, swelling of the face, lips, or tongue Side effects that usually do not require medical attention (report to your  doctor or health care professional if they continue or are bothersome): -fever -headache -muscle aches and pains -pain, tenderness, redness, or swelling at site where injected -weak or tired This list may not describe all possible side effects. Call your doctor for medical advice about side effects. You may report side effects to FDA at 1-800-FDA-1088. Where should I keep my medicine? This vaccine is only given in a clinic, pharmacy, doctor's office, or other health care setting and will not be stored at home. NOTE: This sheet is a summary. It may not cover all possible information. If you have questions about this medicine, talk to your doctor, pharmacist, or health care provider.  2013, [employer]. (12/06/2007 9:30:40 AM)

## 2013-02-17 DIAGNOSIS — E119 Type 2 diabetes mellitus without complications: Secondary | ICD-10-CM | POA: Diagnosis not present

## 2013-02-17 LAB — NMR, LIPOPROFILE
Cholesterol: 120 mg/dL (ref <200)
HDL Cholesterol by NMR: 34 mg/dL — ABNORMAL LOW (ref >=40)
LDL Particle Number: 830 nmol/L (ref <1000)
LDLC SERPL CALC-MCNC: 58 mg/dL (ref <100)
LP-IR Score: 48 — ABNORMAL HIGH (ref <=45)
Triglycerides by NMR: 138 mg/dL (ref <150)

## 2013-02-17 LAB — CMP14+EGFR
ALT: 7 IU/L (ref 0–32)
AST: 13 IU/L (ref 0–40)
Albumin: 3.7 g/dL (ref 3.6–4.8)
BUN: 28 mg/dL — ABNORMAL HIGH (ref 8–27)
CO2: 19 mmol/L (ref 18–29)
Calcium: 8.9 mg/dL (ref 8.6–10.2)
Chloride: 103 mmol/L (ref 97–108)
GFR calc Af Amer: 41 mL/min/{1.73_m2} — ABNORMAL LOW (ref >59)
Glucose: 263 mg/dL — ABNORMAL HIGH (ref 65–99)
Sodium: 143 mmol/L (ref 134–144)
Total Bilirubin: 0.2 mg/dL (ref 0.0–1.2)
Total Protein: 6.1 g/dL (ref 6.0–8.5)

## 2013-02-17 LAB — POCT UA - MICROALBUMIN: Microalbumin Ur, POC: 100 mg/L

## 2013-02-17 NOTE — Addendum Note (Signed)
Addended by: Pollyann Kennedy F on: 02/17/2013 12:40 PM   Modules accepted: Orders

## 2013-02-19 ENCOUNTER — Encounter (INDEPENDENT_AMBULATORY_CARE_PROVIDER_SITE_OTHER): Payer: Self-pay

## 2013-02-20 ENCOUNTER — Encounter (INDEPENDENT_AMBULATORY_CARE_PROVIDER_SITE_OTHER): Payer: Self-pay

## 2013-02-27 ENCOUNTER — Encounter (HOSPITAL_COMMUNITY): Payer: Medicare Other

## 2013-03-02 ENCOUNTER — Telehealth: Payer: Self-pay | Admitting: Nurse Practitioner

## 2013-03-02 ENCOUNTER — Other Ambulatory Visit (HOSPITAL_COMMUNITY): Payer: Self-pay | Admitting: *Deleted

## 2013-03-02 NOTE — Telephone Encounter (Signed)
Patient mailed scripts but [facility] hasn't received them,  They want someone to verify over the phone Call pharmacy [phone]

## 2013-03-05 ENCOUNTER — Encounter (HOSPITAL_COMMUNITY)
Admission: RE | Admit: 2013-03-05 | Discharge: 2013-03-05 | Disposition: A | Discharge status: Home / Self Care | Payer: Medicare Other | Source: Ambulatory Visit | Attending: Nephrology | Admitting: Nephrology

## 2013-03-05 DIAGNOSIS — IMO0001 Reserved for inherently not codable concepts without codable children: Secondary | ICD-10-CM | POA: Diagnosis not present

## 2013-03-05 DIAGNOSIS — D638 Anemia in other chronic diseases classified elsewhere: Secondary | ICD-10-CM | POA: Diagnosis not present

## 2013-03-05 DIAGNOSIS — N183 Chronic kidney disease, stage 3 unspecified: Secondary | ICD-10-CM | POA: Insufficient documentation

## 2013-03-05 DIAGNOSIS — S72499A Other fracture of lower end of unspecified femur, initial encounter for closed fracture: Secondary | ICD-10-CM | POA: Diagnosis not present

## 2013-03-05 LAB — IRON AND TIBC
Iron: 121 ug/dL (ref 42–135)
Saturation Ratios: 44 % (ref 20–55)
TIBC: 278 ug/dL (ref 250–470)
UIBC: 157 ug/dL (ref 125–400)

## 2013-03-05 LAB — FERRITIN: Ferritin: 663 ng/mL — ABNORMAL HIGH (ref 10–291)

## 2013-03-05 MED ORDER — DARBEPOETIN ALFA-POLYSORBATE 100 MCG/0.5ML IJ SOLN: 100.0000 ug | INTRAMUSCULAR | Status: DC

## 2013-03-05 NOTE — Telephone Encounter (Signed)
Please advise 

## 2013-03-06 ENCOUNTER — Telehealth: Payer: Self-pay | Admitting: *Deleted

## 2013-03-06 ENCOUNTER — Encounter (HOSPITAL_COMMUNITY): Payer: Medicare Other

## 2013-03-06 ENCOUNTER — Other Ambulatory Visit: Payer: Self-pay | Admitting: Nurse Practitioner

## 2013-03-06 MED ORDER — GLIMEPIRIDE 4 MG PO TABS: 4.0000 mg | ORAL_TABLET | Freq: Every day | ORAL | Status: DC

## 2013-03-09 ENCOUNTER — Telehealth (INDEPENDENT_AMBULATORY_CARE_PROVIDER_SITE_OTHER): Payer: Self-pay | Admitting: *Deleted

## 2013-03-09 NOTE — Telephone Encounter (Signed)
Barbara with home health called to ask if Dr. Zella Richer would be willing to sign an order to increase wound care/dressing changes from twice weekly to three times weekly.  Explained that I will send a message to Dr. Zella Richer to ask then will give her a call back.  She reports that patient does not have the supportive staff with family and friends to be able to maintain dressing changes by the nurse twice weekly.

## 2013-03-09 NOTE — Telephone Encounter (Signed)
Pamala Hurry updated at this time.  She states understanding of this verbal order and agreeable at this time.

## 2013-03-09 NOTE — Telephone Encounter (Signed)
That is fine 

## 2013-03-13 ENCOUNTER — Ambulatory Visit (INDEPENDENT_AMBULATORY_CARE_PROVIDER_SITE_OTHER): Payer: Medicare Other | Admitting: General Surgery

## 2013-03-13 ENCOUNTER — Encounter (INDEPENDENT_AMBULATORY_CARE_PROVIDER_SITE_OTHER): Payer: Self-pay

## 2013-03-13 VITALS — BP 128/76 | HR 78 | Temp 98.0°F | Resp 18 | Ht 62.0 in | Wt 175.0 lb

## 2013-03-13 DIAGNOSIS — L8993 Pressure ulcer of unspecified site, stage 3: Secondary | ICD-10-CM

## 2013-03-13 DIAGNOSIS — L89109 Pressure ulcer of unspecified part of back, unspecified stage: Secondary | ICD-10-CM | POA: Diagnosis not present

## 2013-03-13 DIAGNOSIS — L89153 Pressure ulcer of sacral region, stage 3: Secondary | ICD-10-CM

## 2013-03-13 NOTE — Patient Instructions (Signed)
Continue current wound care. Keep blood sugars less than 200.

## 2013-03-13 NOTE — Telephone Encounter (Signed)
done

## 2013-03-13 NOTE — Progress Notes (Signed)
Subjective:     Patient ID: [name], female   DOB: [birth year], 67 y.o.   MRN: [id number]  HPI  She is here for another wound check a first stage III sacral decubitus ulcer. She reports that the nurses the wound is getting smaller. She has no pain.   Review of Systems As an     Objective:   Physical Exam Generally she looks well and is in no acute distress.  Musculoskeletal-the sacral wound is a little smaller and is clean with no purulent drainage.    Assessment:     Stage III sacral decubitus ulcer that is slowly healing in. Other options are negative pressure wound therapy but I think it is too close to the anus. Final option would be a plastic surgery referral for possible flap closure but I think we can give it a couple more months before we make that referral and I discussed this with her.     Plan:     Continue current wound care. Keep blood sugars less than 200. Return visit one month.

## 2013-03-20 ENCOUNTER — Encounter (HOSPITAL_COMMUNITY)
Admission: RE | Admit: 2013-03-20 | Discharge: 2013-03-20 | Disposition: A | Discharge status: Home / Self Care | Payer: Medicare Other | Source: Ambulatory Visit | Attending: Nephrology | Admitting: Nephrology

## 2013-03-20 MED ORDER — DARBEPOETIN ALFA-POLYSORBATE 100 MCG/0.5ML IJ SOLN
INTRAMUSCULAR | Status: AC
  Filled 2013-03-20: qty 0.5

## 2013-03-20 MED ORDER — DARBEPOETIN ALFA-POLYSORBATE 100 MCG/0.5ML IJ SOLN
100.0000 ug | INTRAMUSCULAR | Status: DC
  Administered 2013-03-20: 09:00:00 100 ug via SUBCUTANEOUS

## 2013-03-23 DIAGNOSIS — I129 Hypertensive chronic kidney disease with stage 1 through stage 4 chronic kidney disease, or unspecified chronic kidney disease: Secondary | ICD-10-CM

## 2013-03-23 DIAGNOSIS — E119 Type 2 diabetes mellitus without complications: Secondary | ICD-10-CM

## 2013-03-23 DIAGNOSIS — L98499 Non-pressure chronic ulcer of skin of other sites with unspecified severity: Secondary | ICD-10-CM

## 2013-03-23 DIAGNOSIS — I509 Heart failure, unspecified: Secondary | ICD-10-CM | POA: Diagnosis not present

## 2013-03-23 DIAGNOSIS — N183 Chronic kidney disease, stage 3 (moderate): Secondary | ICD-10-CM

## 2013-04-02 ENCOUNTER — Other Ambulatory Visit (INDEPENDENT_AMBULATORY_CARE_PROVIDER_SITE_OTHER): Payer: Self-pay | Admitting: General Surgery

## 2013-04-02 ENCOUNTER — Encounter (INDEPENDENT_AMBULATORY_CARE_PROVIDER_SITE_OTHER): Payer: Self-pay | Admitting: General Surgery

## 2013-04-02 ENCOUNTER — Telehealth (INDEPENDENT_AMBULATORY_CARE_PROVIDER_SITE_OTHER): Payer: Self-pay

## 2013-04-02 ENCOUNTER — Ambulatory Visit (INDEPENDENT_AMBULATORY_CARE_PROVIDER_SITE_OTHER): Payer: Medicare Other | Admitting: General Surgery

## 2013-04-02 ENCOUNTER — Encounter (INDEPENDENT_AMBULATORY_CARE_PROVIDER_SITE_OTHER): Payer: Self-pay

## 2013-04-02 VITALS — BP 132/78 | HR 76 | Temp 97.7°F | Resp 14 | Ht 62.0 in | Wt 180.0 lb

## 2013-04-02 DIAGNOSIS — L89109 Pressure ulcer of unspecified part of back, unspecified stage: Secondary | ICD-10-CM | POA: Diagnosis not present

## 2013-04-02 DIAGNOSIS — L89153 Pressure ulcer of sacral region, stage 3: Secondary | ICD-10-CM

## 2013-04-02 DIAGNOSIS — L8993 Pressure ulcer of unspecified site, stage 3: Secondary | ICD-10-CM

## 2013-04-02 NOTE — Progress Notes (Signed)
Subjective:     Patient ID: [name], female   DOB: [birth year], 67 y.o.   MRN: [id number]  HPI  She is here for another wound check.  She has a chronic stage III sacral decubitus ulcer. This is being cared for with wet to dry dressing changes. She has no pain with this. Her blood sugars are less than 200. Review of Systems      Objective:   Physical Exam Generally she looks well and is in no acute distress.  Musculoskeletal-the sacral wound is about the same size. No purulent drainage.   Assessment:     Stage III sacral decubitus ulcer-no significant change in the wound.    Plan:     Continue current wound care. Referral to plastic surgery for second opinion and question of flap closure.

## 2013-04-02 NOTE — Patient Instructions (Signed)
Continue current wound care. Will ask a plastic surgeon to see you.

## 2013-04-02 NOTE — Telephone Encounter (Signed)
LMOV.  Pt has appt with Dr. Crissie Reese on 04/09/13 at 3:30pm.

## 2013-04-03 NOTE — Telephone Encounter (Signed)
Verbally confirmed appt with patient to see Dr. Harlow Mares.  She also asked if Dr. Zella Richer will refill her saline Rx.  I told her to call us after she sees Dr. Harlow Mares.  If he decides to schedule her for surgery she will not need the saline.  Pt understood and agreed.

## 2013-04-07 DIAGNOSIS — I509 Heart failure, unspecified: Secondary | ICD-10-CM | POA: Diagnosis not present

## 2013-04-07 DIAGNOSIS — I129 Hypertensive chronic kidney disease with stage 1 through stage 4 chronic kidney disease, or unspecified chronic kidney disease: Secondary | ICD-10-CM | POA: Diagnosis not present

## 2013-04-07 DIAGNOSIS — E119 Type 2 diabetes mellitus without complications: Secondary | ICD-10-CM | POA: Diagnosis not present

## 2013-04-07 DIAGNOSIS — N185 Chronic kidney disease, stage 5: Secondary | ICD-10-CM | POA: Diagnosis not present

## 2013-04-07 DIAGNOSIS — I2510 Atherosclerotic heart disease of native coronary artery without angina pectoris: Secondary | ICD-10-CM | POA: Diagnosis not present

## 2013-04-07 DIAGNOSIS — L98499 Non-pressure chronic ulcer of skin of other sites with unspecified severity: Secondary | ICD-10-CM | POA: Diagnosis not present

## 2013-04-09 ENCOUNTER — Encounter (HOSPITAL_COMMUNITY)
Admission: RE | Admit: 2013-04-09 | Discharge: 2013-04-09 | Disposition: A | Discharge status: Home / Self Care | Payer: Medicare Other | Source: Ambulatory Visit | Attending: Nephrology | Admitting: Nephrology

## 2013-04-09 DIAGNOSIS — I2510 Atherosclerotic heart disease of native coronary artery without angina pectoris: Secondary | ICD-10-CM | POA: Diagnosis not present

## 2013-04-09 DIAGNOSIS — D638 Anemia in other chronic diseases classified elsewhere: Secondary | ICD-10-CM | POA: Insufficient documentation

## 2013-04-09 DIAGNOSIS — S31809A Unspecified open wound of unspecified buttock, initial encounter: Secondary | ICD-10-CM | POA: Diagnosis not present

## 2013-04-09 DIAGNOSIS — N183 Chronic kidney disease, stage 3 unspecified: Secondary | ICD-10-CM | POA: Diagnosis not present

## 2013-04-09 DIAGNOSIS — I129 Hypertensive chronic kidney disease with stage 1 through stage 4 chronic kidney disease, or unspecified chronic kidney disease: Secondary | ICD-10-CM | POA: Diagnosis not present

## 2013-04-09 DIAGNOSIS — E119 Type 2 diabetes mellitus without complications: Secondary | ICD-10-CM | POA: Diagnosis not present

## 2013-04-09 DIAGNOSIS — R7309 Other abnormal glucose: Secondary | ICD-10-CM | POA: Diagnosis not present

## 2013-04-09 DIAGNOSIS — L98499 Non-pressure chronic ulcer of skin of other sites with unspecified severity: Secondary | ICD-10-CM | POA: Diagnosis not present

## 2013-04-09 DIAGNOSIS — N185 Chronic kidney disease, stage 5: Secondary | ICD-10-CM | POA: Diagnosis not present

## 2013-04-09 DIAGNOSIS — I509 Heart failure, unspecified: Secondary | ICD-10-CM | POA: Diagnosis not present

## 2013-04-09 LAB — POCT HEMOGLOBIN-HEMACUE: Hemoglobin: 12.4 g/dL (ref 12.0–15.0)

## 2013-04-09 MED ORDER — DARBEPOETIN ALFA-POLYSORBATE 100 MCG/0.5ML IJ SOLN: 100.0000 ug | INTRAMUSCULAR | Status: DC

## 2013-04-10 ENCOUNTER — Encounter (HOSPITAL_COMMUNITY): Payer: Medicare Other

## 2013-04-11 ENCOUNTER — Telehealth (INDEPENDENT_AMBULATORY_CARE_PROVIDER_SITE_OTHER): Payer: Self-pay | Admitting: *Deleted

## 2013-04-11 DIAGNOSIS — L98499 Non-pressure chronic ulcer of skin of other sites with unspecified severity: Secondary | ICD-10-CM | POA: Diagnosis not present

## 2013-04-11 DIAGNOSIS — I129 Hypertensive chronic kidney disease with stage 1 through stage 4 chronic kidney disease, or unspecified chronic kidney disease: Secondary | ICD-10-CM | POA: Diagnosis not present

## 2013-04-11 DIAGNOSIS — I509 Heart failure, unspecified: Secondary | ICD-10-CM | POA: Diagnosis not present

## 2013-04-11 DIAGNOSIS — I2510 Atherosclerotic heart disease of native coronary artery without angina pectoris: Secondary | ICD-10-CM | POA: Diagnosis not present

## 2013-04-11 DIAGNOSIS — E119 Type 2 diabetes mellitus without complications: Secondary | ICD-10-CM | POA: Diagnosis not present

## 2013-04-11 DIAGNOSIS — N185 Chronic kidney disease, stage 5: Secondary | ICD-10-CM | POA: Diagnosis not present

## 2013-04-11 NOTE — Telephone Encounter (Signed)
Left message for Pamala Hurry with the below message.  Advised them that because we will not be doing surgery and the PMD has already adjusted Dr. Bertrum Sol order then it would be best for the PMD to take over patient's wound care plan and pain management plan.

## 2013-04-11 NOTE — Telephone Encounter (Signed)
She stated that she had been to a wound care center in the past but her PMD is more than welcome to refer her to another one.  Pain management per PMD.

## 2013-04-11 NOTE — Telephone Encounter (Signed)
[name], home health nurse, called regarding patient.  She states that patient went to see plastic surgeon but was told that due to where it is there is nothing they could do.  Asking about what the appropriate plan is for the pain.  She is also asking for a verbal order to continue dressing changes 3 times per week.  She also asked if there were any changes to the dressing orders.  When I asked her if they were still doing wet to dry which is what Dr. Bertrum Sol last note states she said no that they are doing Aquacell dressings per patients PMD.  She was also asking if it would be appropriate for patient to be referred to wound center.  Explained that I would have to send a message to Dr. Zella Richer for him to review and determine the best plan.  Also wanted to update him that it sounds like PMD is getting involved and changing orders.  Explained to [name] that as soon as we hear back from Dr. Zella Richer then we will give her a call to update her.  She states understanding and agreeable at this time.

## 2013-04-13 ENCOUNTER — Telehealth: Payer: Self-pay | Admitting: Family Medicine

## 2013-04-13 DIAGNOSIS — L98499 Non-pressure chronic ulcer of skin of other sites with unspecified severity: Secondary | ICD-10-CM | POA: Diagnosis not present

## 2013-04-13 DIAGNOSIS — I2510 Atherosclerotic heart disease of native coronary artery without angina pectoris: Secondary | ICD-10-CM | POA: Diagnosis not present

## 2013-04-13 DIAGNOSIS — I129 Hypertensive chronic kidney disease with stage 1 through stage 4 chronic kidney disease, or unspecified chronic kidney disease: Secondary | ICD-10-CM | POA: Diagnosis not present

## 2013-04-13 DIAGNOSIS — N185 Chronic kidney disease, stage 5: Secondary | ICD-10-CM | POA: Diagnosis not present

## 2013-04-13 DIAGNOSIS — E119 Type 2 diabetes mellitus without complications: Secondary | ICD-10-CM | POA: Diagnosis not present

## 2013-04-13 DIAGNOSIS — I509 Heart failure, unspecified: Secondary | ICD-10-CM | POA: Diagnosis not present

## 2013-04-16 DIAGNOSIS — I509 Heart failure, unspecified: Secondary | ICD-10-CM | POA: Diagnosis not present

## 2013-04-16 DIAGNOSIS — I2510 Atherosclerotic heart disease of native coronary artery without angina pectoris: Secondary | ICD-10-CM | POA: Diagnosis not present

## 2013-04-16 DIAGNOSIS — I129 Hypertensive chronic kidney disease with stage 1 through stage 4 chronic kidney disease, or unspecified chronic kidney disease: Secondary | ICD-10-CM | POA: Diagnosis not present

## 2013-04-16 DIAGNOSIS — L98499 Non-pressure chronic ulcer of skin of other sites with unspecified severity: Secondary | ICD-10-CM | POA: Diagnosis not present

## 2013-04-16 DIAGNOSIS — E119 Type 2 diabetes mellitus without complications: Secondary | ICD-10-CM | POA: Diagnosis not present

## 2013-04-16 DIAGNOSIS — N185 Chronic kidney disease, stage 5: Secondary | ICD-10-CM | POA: Diagnosis not present

## 2013-04-17 NOTE — Telephone Encounter (Signed)
Will have to get from surgeon who saw her for decunitus

## 2013-04-18 DIAGNOSIS — N185 Chronic kidney disease, stage 5: Secondary | ICD-10-CM | POA: Diagnosis not present

## 2013-04-18 DIAGNOSIS — I2510 Atherosclerotic heart disease of native coronary artery without angina pectoris: Secondary | ICD-10-CM | POA: Diagnosis not present

## 2013-04-18 DIAGNOSIS — I129 Hypertensive chronic kidney disease with stage 1 through stage 4 chronic kidney disease, or unspecified chronic kidney disease: Secondary | ICD-10-CM | POA: Diagnosis not present

## 2013-04-18 DIAGNOSIS — L98499 Non-pressure chronic ulcer of skin of other sites with unspecified severity: Secondary | ICD-10-CM | POA: Diagnosis not present

## 2013-04-18 DIAGNOSIS — E119 Type 2 diabetes mellitus without complications: Secondary | ICD-10-CM | POA: Diagnosis not present

## 2013-04-18 DIAGNOSIS — I509 Heart failure, unspecified: Secondary | ICD-10-CM | POA: Diagnosis not present

## 2013-04-18 NOTE — Telephone Encounter (Signed)
Home health said that doctor moore does her orders for the dressing changes

## 2013-04-18 NOTE — Telephone Encounter (Signed)
[name] which she'll address this. If I need to address it please talk to me

## 2013-04-18 NOTE — Telephone Encounter (Signed)
Home health aware. Mmm had already addressed

## 2013-04-18 NOTE — Telephone Encounter (Signed)
Call with verbal order for dressing change 3X a week- if wound is healung do not need wound clinic- if not healing will do referral

## 2013-04-20 DIAGNOSIS — I129 Hypertensive chronic kidney disease with stage 1 through stage 4 chronic kidney disease, or unspecified chronic kidney disease: Secondary | ICD-10-CM | POA: Diagnosis not present

## 2013-04-20 DIAGNOSIS — I509 Heart failure, unspecified: Secondary | ICD-10-CM | POA: Diagnosis not present

## 2013-04-20 DIAGNOSIS — I2510 Atherosclerotic heart disease of native coronary artery without angina pectoris: Secondary | ICD-10-CM | POA: Diagnosis not present

## 2013-04-20 DIAGNOSIS — N185 Chronic kidney disease, stage 5: Secondary | ICD-10-CM | POA: Diagnosis not present

## 2013-04-20 DIAGNOSIS — L98499 Non-pressure chronic ulcer of skin of other sites with unspecified severity: Secondary | ICD-10-CM | POA: Diagnosis not present

## 2013-04-20 DIAGNOSIS — E119 Type 2 diabetes mellitus without complications: Secondary | ICD-10-CM | POA: Diagnosis not present

## 2013-04-23 DIAGNOSIS — N185 Chronic kidney disease, stage 5: Secondary | ICD-10-CM | POA: Diagnosis not present

## 2013-04-23 DIAGNOSIS — I509 Heart failure, unspecified: Secondary | ICD-10-CM | POA: Diagnosis not present

## 2013-04-23 DIAGNOSIS — L98499 Non-pressure chronic ulcer of skin of other sites with unspecified severity: Secondary | ICD-10-CM | POA: Diagnosis not present

## 2013-04-23 DIAGNOSIS — I2510 Atherosclerotic heart disease of native coronary artery without angina pectoris: Secondary | ICD-10-CM | POA: Diagnosis not present

## 2013-04-23 DIAGNOSIS — E119 Type 2 diabetes mellitus without complications: Secondary | ICD-10-CM | POA: Diagnosis not present

## 2013-04-23 DIAGNOSIS — I129 Hypertensive chronic kidney disease with stage 1 through stage 4 chronic kidney disease, or unspecified chronic kidney disease: Secondary | ICD-10-CM | POA: Diagnosis not present

## 2013-04-25 DIAGNOSIS — E119 Type 2 diabetes mellitus without complications: Secondary | ICD-10-CM | POA: Diagnosis not present

## 2013-04-25 DIAGNOSIS — I509 Heart failure, unspecified: Secondary | ICD-10-CM | POA: Diagnosis not present

## 2013-04-25 DIAGNOSIS — L98499 Non-pressure chronic ulcer of skin of other sites with unspecified severity: Secondary | ICD-10-CM | POA: Diagnosis not present

## 2013-04-25 DIAGNOSIS — N185 Chronic kidney disease, stage 5: Secondary | ICD-10-CM | POA: Diagnosis not present

## 2013-04-25 DIAGNOSIS — I129 Hypertensive chronic kidney disease with stage 1 through stage 4 chronic kidney disease, or unspecified chronic kidney disease: Secondary | ICD-10-CM | POA: Diagnosis not present

## 2013-04-25 DIAGNOSIS — I2510 Atherosclerotic heart disease of native coronary artery without angina pectoris: Secondary | ICD-10-CM | POA: Diagnosis not present

## 2013-04-26 ENCOUNTER — Ambulatory Visit (INDEPENDENT_AMBULATORY_CARE_PROVIDER_SITE_OTHER): Payer: Medicare Other | Admitting: General Surgery

## 2013-04-26 ENCOUNTER — Encounter (HOSPITAL_COMMUNITY)
Admission: RE | Admit: 2013-04-26 | Discharge: 2013-04-26 | Disposition: A | Discharge status: Home / Self Care | Payer: Medicare Other | Source: Ambulatory Visit | Attending: Nephrology | Admitting: Nephrology

## 2013-04-26 ENCOUNTER — Encounter (INDEPENDENT_AMBULATORY_CARE_PROVIDER_SITE_OTHER): Payer: Self-pay | Admitting: General Surgery

## 2013-04-26 VITALS — BP 156/80 | HR 72 | Temp 98.3°F | Resp 14 | Ht 62.0 in | Wt 182.8 lb

## 2013-04-26 DIAGNOSIS — L8993 Pressure ulcer of unspecified site, stage 3: Secondary | ICD-10-CM | POA: Diagnosis not present

## 2013-04-26 DIAGNOSIS — N183 Chronic kidney disease, stage 3 unspecified: Secondary | ICD-10-CM | POA: Diagnosis not present

## 2013-04-26 DIAGNOSIS — L89109 Pressure ulcer of unspecified part of back, unspecified stage: Secondary | ICD-10-CM | POA: Diagnosis not present

## 2013-04-26 DIAGNOSIS — L89153 Pressure ulcer of sacral region, stage 3: Secondary | ICD-10-CM

## 2013-04-26 DIAGNOSIS — D638 Anemia in other chronic diseases classified elsewhere: Secondary | ICD-10-CM | POA: Insufficient documentation

## 2013-04-26 LAB — POCT HEMOGLOBIN-HEMACUE: Hemoglobin: 11.9 g/dL — ABNORMAL LOW (ref 12.0–15.0)

## 2013-04-26 MED ORDER — DARBEPOETIN ALFA-POLYSORBATE 100 MCG/0.5ML IJ SOLN
100.0000 ug | INTRAMUSCULAR | Status: DC
Start: 2013-04-26 — End: 2013-04-27
  Administered 2013-04-26: 100 ug via SUBCUTANEOUS

## 2013-04-26 MED ORDER — DARBEPOETIN ALFA-POLYSORBATE 100 MCG/0.5ML IJ SOLN
INTRAMUSCULAR | Status: AC
  Administered 2013-04-26: 100 ug via SUBCUTANEOUS
  Filled 2013-04-26: qty 0.5

## 2013-04-26 NOTE — Patient Instructions (Addendum)
Continue current dressing changes. We will make a referral to the wound care center here in [place].  The address is [address] in [place].  The phone number is [phone] is you need to get directions.

## 2013-04-26 NOTE — Progress Notes (Signed)
Subjective:     Patient ID: [name], female   DOB: November 03, 1945, 67 y.o.   MRN: [id number]  HPI  She is here for another wound check.  She has a chronic stage III sacral decubitus ulcer. She is changing the dressing everyday.  She has been working on better blood sugar control.  Plastic surgery did not think she was a good candidate for flap closure.  Review of Systems     Objective:   Physical Exam Generally she looks well and is in no acute distress.  Musculoskeletal-the sacral wound is slightly smaller and clean. No purulent drainage.   Assessment:     Stage III sacral decubitus ulcer-very slow healing.   Plan:     Continue current wound care. Refer to [facility] wound care center.

## 2013-04-27 DIAGNOSIS — I2510 Atherosclerotic heart disease of native coronary artery without angina pectoris: Secondary | ICD-10-CM | POA: Diagnosis not present

## 2013-04-27 DIAGNOSIS — I509 Heart failure, unspecified: Secondary | ICD-10-CM | POA: Diagnosis not present

## 2013-04-27 DIAGNOSIS — E119 Type 2 diabetes mellitus without complications: Secondary | ICD-10-CM | POA: Diagnosis not present

## 2013-04-27 DIAGNOSIS — L98499 Non-pressure chronic ulcer of skin of other sites with unspecified severity: Secondary | ICD-10-CM | POA: Diagnosis not present

## 2013-04-27 DIAGNOSIS — I129 Hypertensive chronic kidney disease with stage 1 through stage 4 chronic kidney disease, or unspecified chronic kidney disease: Secondary | ICD-10-CM | POA: Diagnosis not present

## 2013-04-27 DIAGNOSIS — N185 Chronic kidney disease, stage 5: Secondary | ICD-10-CM | POA: Diagnosis not present

## 2013-04-30 DIAGNOSIS — I129 Hypertensive chronic kidney disease with stage 1 through stage 4 chronic kidney disease, or unspecified chronic kidney disease: Secondary | ICD-10-CM | POA: Diagnosis not present

## 2013-04-30 DIAGNOSIS — I2510 Atherosclerotic heart disease of native coronary artery without angina pectoris: Secondary | ICD-10-CM | POA: Diagnosis not present

## 2013-04-30 DIAGNOSIS — L98499 Non-pressure chronic ulcer of skin of other sites with unspecified severity: Secondary | ICD-10-CM | POA: Diagnosis not present

## 2013-04-30 DIAGNOSIS — I509 Heart failure, unspecified: Secondary | ICD-10-CM | POA: Diagnosis not present

## 2013-04-30 DIAGNOSIS — E119 Type 2 diabetes mellitus without complications: Secondary | ICD-10-CM | POA: Diagnosis not present

## 2013-04-30 DIAGNOSIS — N185 Chronic kidney disease, stage 5: Secondary | ICD-10-CM | POA: Diagnosis not present

## 2013-05-02 DIAGNOSIS — L98499 Non-pressure chronic ulcer of skin of other sites with unspecified severity: Secondary | ICD-10-CM | POA: Diagnosis not present

## 2013-05-02 DIAGNOSIS — E119 Type 2 diabetes mellitus without complications: Secondary | ICD-10-CM | POA: Diagnosis not present

## 2013-05-02 DIAGNOSIS — I2510 Atherosclerotic heart disease of native coronary artery without angina pectoris: Secondary | ICD-10-CM | POA: Diagnosis not present

## 2013-05-02 DIAGNOSIS — I509 Heart failure, unspecified: Secondary | ICD-10-CM | POA: Diagnosis not present

## 2013-05-02 DIAGNOSIS — N185 Chronic kidney disease, stage 5: Secondary | ICD-10-CM | POA: Diagnosis not present

## 2013-05-02 DIAGNOSIS — I129 Hypertensive chronic kidney disease with stage 1 through stage 4 chronic kidney disease, or unspecified chronic kidney disease: Secondary | ICD-10-CM | POA: Diagnosis not present

## 2013-05-04 DIAGNOSIS — I509 Heart failure, unspecified: Secondary | ICD-10-CM | POA: Diagnosis not present

## 2013-05-04 DIAGNOSIS — N185 Chronic kidney disease, stage 5: Secondary | ICD-10-CM | POA: Diagnosis not present

## 2013-05-04 DIAGNOSIS — L98499 Non-pressure chronic ulcer of skin of other sites with unspecified severity: Secondary | ICD-10-CM | POA: Diagnosis not present

## 2013-05-04 DIAGNOSIS — I2510 Atherosclerotic heart disease of native coronary artery without angina pectoris: Secondary | ICD-10-CM | POA: Diagnosis not present

## 2013-05-04 DIAGNOSIS — E119 Type 2 diabetes mellitus without complications: Secondary | ICD-10-CM | POA: Diagnosis not present

## 2013-05-04 DIAGNOSIS — I129 Hypertensive chronic kidney disease with stage 1 through stage 4 chronic kidney disease, or unspecified chronic kidney disease: Secondary | ICD-10-CM | POA: Diagnosis not present

## 2013-05-07 DIAGNOSIS — N185 Chronic kidney disease, stage 5: Secondary | ICD-10-CM | POA: Diagnosis not present

## 2013-05-07 DIAGNOSIS — E119 Type 2 diabetes mellitus without complications: Secondary | ICD-10-CM | POA: Diagnosis not present

## 2013-05-07 DIAGNOSIS — I2510 Atherosclerotic heart disease of native coronary artery without angina pectoris: Secondary | ICD-10-CM | POA: Diagnosis not present

## 2013-05-07 DIAGNOSIS — I509 Heart failure, unspecified: Secondary | ICD-10-CM | POA: Diagnosis not present

## 2013-05-07 DIAGNOSIS — I129 Hypertensive chronic kidney disease with stage 1 through stage 4 chronic kidney disease, or unspecified chronic kidney disease: Secondary | ICD-10-CM | POA: Diagnosis not present

## 2013-05-07 DIAGNOSIS — L98499 Non-pressure chronic ulcer of skin of other sites with unspecified severity: Secondary | ICD-10-CM | POA: Diagnosis not present

## 2013-05-08 ENCOUNTER — Encounter (HOSPITAL_BASED_OUTPATIENT_CLINIC_OR_DEPARTMENT_OTHER): Payer: Medicare Other | Attending: General Surgery

## 2013-05-08 DIAGNOSIS — I10 Essential (primary) hypertension: Secondary | ICD-10-CM | POA: Diagnosis not present

## 2013-05-08 DIAGNOSIS — Z79899 Other long term (current) drug therapy: Secondary | ICD-10-CM | POA: Diagnosis not present

## 2013-05-08 DIAGNOSIS — E119 Type 2 diabetes mellitus without complications: Secondary | ICD-10-CM | POA: Diagnosis not present

## 2013-05-08 DIAGNOSIS — E079 Disorder of thyroid, unspecified: Secondary | ICD-10-CM | POA: Diagnosis not present

## 2013-05-08 DIAGNOSIS — L89109 Pressure ulcer of unspecified part of back, unspecified stage: Secondary | ICD-10-CM | POA: Diagnosis not present

## 2013-05-08 DIAGNOSIS — Y838 Other surgical procedures as the cause of abnormal reaction of the patient, or of later complication, without mention of misadventure at the time of the procedure: Secondary | ICD-10-CM | POA: Insufficient documentation

## 2013-05-08 DIAGNOSIS — T8189XA Other complications of procedures, not elsewhere classified, initial encounter: Secondary | ICD-10-CM | POA: Insufficient documentation

## 2013-05-08 DIAGNOSIS — M109 Gout, unspecified: Secondary | ICD-10-CM | POA: Insufficient documentation

## 2013-05-08 DIAGNOSIS — L8993 Pressure ulcer of unspecified site, stage 3: Secondary | ICD-10-CM | POA: Insufficient documentation

## 2013-05-09 DIAGNOSIS — E119 Type 2 diabetes mellitus without complications: Secondary | ICD-10-CM | POA: Diagnosis not present

## 2013-05-09 DIAGNOSIS — I129 Hypertensive chronic kidney disease with stage 1 through stage 4 chronic kidney disease, or unspecified chronic kidney disease: Secondary | ICD-10-CM | POA: Diagnosis not present

## 2013-05-09 DIAGNOSIS — N185 Chronic kidney disease, stage 5: Secondary | ICD-10-CM | POA: Diagnosis not present

## 2013-05-09 DIAGNOSIS — L98499 Non-pressure chronic ulcer of skin of other sites with unspecified severity: Secondary | ICD-10-CM | POA: Diagnosis not present

## 2013-05-09 DIAGNOSIS — I2510 Atherosclerotic heart disease of native coronary artery without angina pectoris: Secondary | ICD-10-CM | POA: Diagnosis not present

## 2013-05-09 DIAGNOSIS — I509 Heart failure, unspecified: Secondary | ICD-10-CM | POA: Diagnosis not present

## 2013-05-09 NOTE — H&P (Signed)
[name], [name] NO.:  [id number]  MEDICAL RECORD NO.:  [id number]  LOCATION:  FOOT                         FACILITY:  [facility]  PHYSICIAN:  Elesa Hacker, M.D.        DATE OF BIRTH:  1945-12-16  DATE OF ADMISSION:  05/08/2013 DATE OF DISCHARGE:                             HISTORY & PHYSICAL   CHIEF COMPLAINT:  Wound between buttocks.  HISTORY OF PRESENT ILLNESS:  This 67 year old female had an infection between her buttocks which was lanced several years ago.  Approximately 7 months ago, this area which had healed broke down and has been treated with packing for approximately 5 months with a very slow response, although she says it is bit smaller.  PAST MEDICAL HISTORY:  Significant for: 1. Diabetes. 2. Hypertension. 3. Thyroid disease. 4. Gout. 5. Arthritis with rheumatoid factors.  PAST SURGICAL HISTORY:  Laser surgery to the eyes and rods in left femur.  SOCIAL HISTORY:  Cigarettes, none.  Alcohol, none.  MEDICATIONS:  Lipitor, calcitriol, Catapres, Lasix, Amaryl, Synthroid, Cozaar, Reglan, Toprol-XL, Protonix, Actos.  ALLERGIES:  ASPIRIN, CIPRO, CODEINE, MICARDIS, ROFECOXIB.  REVIEW OF SYSTEMS:  As above.  PHYSICAL EXAMINATION:  VITAL SIGNS:  Temperature 98.4, pulse 70, respirations 16, blood pressure 194/98.  Glucose is 84. GENERAL APPEARANCE:  Well developed, slightly obese, in no distress. CHEST:  Clear. HEART:  Regular rhythm. EXTREMITIES:  In the gluteal fold, there is a 1.0 x 1.0 x 0.5 wound with some skin overhang.  This is a result of unhealed surgical wound.  IMPRESSION:  Unhealed surgical wound in gluteal cleft.  PLAN OF TREATMENT:  We will try the [facility], this is hard to seal and cannot be applied properly.  We will have to treat by other means probably by packing her cauterization.     Elesa Hacker, M.D.     RA/MEDQ  D:  05/08/2013  T:  05/09/2013  Job:  [id number]

## 2013-05-11 DIAGNOSIS — E119 Type 2 diabetes mellitus without complications: Secondary | ICD-10-CM | POA: Diagnosis not present

## 2013-05-11 DIAGNOSIS — I2510 Atherosclerotic heart disease of native coronary artery without angina pectoris: Secondary | ICD-10-CM | POA: Diagnosis not present

## 2013-05-11 DIAGNOSIS — L98499 Non-pressure chronic ulcer of skin of other sites with unspecified severity: Secondary | ICD-10-CM | POA: Diagnosis not present

## 2013-05-11 DIAGNOSIS — I129 Hypertensive chronic kidney disease with stage 1 through stage 4 chronic kidney disease, or unspecified chronic kidney disease: Secondary | ICD-10-CM | POA: Diagnosis not present

## 2013-05-11 DIAGNOSIS — N185 Chronic kidney disease, stage 5: Secondary | ICD-10-CM | POA: Diagnosis not present

## 2013-05-11 DIAGNOSIS — I509 Heart failure, unspecified: Secondary | ICD-10-CM | POA: Diagnosis not present

## 2013-05-12 DIAGNOSIS — I129 Hypertensive chronic kidney disease with stage 1 through stage 4 chronic kidney disease, or unspecified chronic kidney disease: Secondary | ICD-10-CM | POA: Diagnosis not present

## 2013-05-12 DIAGNOSIS — I2510 Atherosclerotic heart disease of native coronary artery without angina pectoris: Secondary | ICD-10-CM | POA: Diagnosis not present

## 2013-05-12 DIAGNOSIS — I509 Heart failure, unspecified: Secondary | ICD-10-CM | POA: Diagnosis not present

## 2013-05-12 DIAGNOSIS — E119 Type 2 diabetes mellitus without complications: Secondary | ICD-10-CM | POA: Diagnosis not present

## 2013-05-12 DIAGNOSIS — L98499 Non-pressure chronic ulcer of skin of other sites with unspecified severity: Secondary | ICD-10-CM | POA: Diagnosis not present

## 2013-05-12 DIAGNOSIS — N185 Chronic kidney disease, stage 5: Secondary | ICD-10-CM | POA: Diagnosis not present

## 2013-05-15 ENCOUNTER — Encounter (HOSPITAL_COMMUNITY)
Admission: RE | Admit: 2013-05-15 | Discharge: 2013-05-15 | Disposition: A | Discharge status: Home / Self Care | Payer: Medicare Other | Source: Ambulatory Visit | Attending: Nephrology | Admitting: Nephrology

## 2013-05-15 DIAGNOSIS — E079 Disorder of thyroid, unspecified: Secondary | ICD-10-CM | POA: Diagnosis not present

## 2013-05-15 DIAGNOSIS — E119 Type 2 diabetes mellitus without complications: Secondary | ICD-10-CM | POA: Diagnosis not present

## 2013-05-15 DIAGNOSIS — I10 Essential (primary) hypertension: Secondary | ICD-10-CM | POA: Diagnosis not present

## 2013-05-15 DIAGNOSIS — T8189XA Other complications of procedures, not elsewhere classified, initial encounter: Secondary | ICD-10-CM | POA: Diagnosis not present

## 2013-05-15 LAB — IRON AND TIBC
Saturation Ratios: 41 % (ref 20–55)
TIBC: 269 ug/dL (ref 250–470)
UIBC: 160 ug/dL (ref 125–400)

## 2013-05-15 LAB — FERRITIN: Ferritin: 498 ng/mL — ABNORMAL HIGH (ref 10–291)

## 2013-05-15 MED ORDER — CLONIDINE HCL 0.1 MG PO TABS
ORAL_TABLET | ORAL | Status: AC
  Administered 2013-05-15: 09:00:00 0.1 mg
  Filled 2013-05-15: qty 1

## 2013-05-15 MED ORDER — DARBEPOETIN ALFA-POLYSORBATE 100 MCG/0.5ML IJ SOLN
100.0000 ug | INTRAMUSCULAR | Status: DC
Start: 2013-05-15 — End: 2013-05-16

## 2013-05-15 NOTE — Progress Notes (Signed)
bp [id number]: parameters for Aranesp not met; Clonidine given

## 2013-05-16 DIAGNOSIS — L98499 Non-pressure chronic ulcer of skin of other sites with unspecified severity: Secondary | ICD-10-CM | POA: Diagnosis not present

## 2013-05-16 DIAGNOSIS — I2510 Atherosclerotic heart disease of native coronary artery without angina pectoris: Secondary | ICD-10-CM | POA: Diagnosis not present

## 2013-05-16 DIAGNOSIS — I509 Heart failure, unspecified: Secondary | ICD-10-CM | POA: Diagnosis not present

## 2013-05-16 DIAGNOSIS — I129 Hypertensive chronic kidney disease with stage 1 through stage 4 chronic kidney disease, or unspecified chronic kidney disease: Secondary | ICD-10-CM | POA: Diagnosis not present

## 2013-05-16 DIAGNOSIS — N185 Chronic kidney disease, stage 5: Secondary | ICD-10-CM | POA: Diagnosis not present

## 2013-05-16 DIAGNOSIS — E119 Type 2 diabetes mellitus without complications: Secondary | ICD-10-CM | POA: Diagnosis not present

## 2013-05-18 DIAGNOSIS — E119 Type 2 diabetes mellitus without complications: Secondary | ICD-10-CM | POA: Diagnosis not present

## 2013-05-18 DIAGNOSIS — N185 Chronic kidney disease, stage 5: Secondary | ICD-10-CM | POA: Diagnosis not present

## 2013-05-18 DIAGNOSIS — I509 Heart failure, unspecified: Secondary | ICD-10-CM | POA: Diagnosis not present

## 2013-05-18 DIAGNOSIS — I129 Hypertensive chronic kidney disease with stage 1 through stage 4 chronic kidney disease, or unspecified chronic kidney disease: Secondary | ICD-10-CM | POA: Diagnosis not present

## 2013-05-18 DIAGNOSIS — L98499 Non-pressure chronic ulcer of skin of other sites with unspecified severity: Secondary | ICD-10-CM | POA: Diagnosis not present

## 2013-05-18 DIAGNOSIS — I2510 Atherosclerotic heart disease of native coronary artery without angina pectoris: Secondary | ICD-10-CM | POA: Diagnosis not present

## 2013-05-19 DIAGNOSIS — I129 Hypertensive chronic kidney disease with stage 1 through stage 4 chronic kidney disease, or unspecified chronic kidney disease: Secondary | ICD-10-CM | POA: Diagnosis not present

## 2013-05-19 DIAGNOSIS — L98499 Non-pressure chronic ulcer of skin of other sites with unspecified severity: Secondary | ICD-10-CM | POA: Diagnosis not present

## 2013-05-19 DIAGNOSIS — E119 Type 2 diabetes mellitus without complications: Secondary | ICD-10-CM | POA: Diagnosis not present

## 2013-05-19 DIAGNOSIS — N185 Chronic kidney disease, stage 5: Secondary | ICD-10-CM | POA: Diagnosis not present

## 2013-05-19 DIAGNOSIS — I509 Heart failure, unspecified: Secondary | ICD-10-CM | POA: Diagnosis not present

## 2013-05-19 DIAGNOSIS — I2510 Atherosclerotic heart disease of native coronary artery without angina pectoris: Secondary | ICD-10-CM | POA: Diagnosis not present

## 2013-05-21 DIAGNOSIS — I129 Hypertensive chronic kidney disease with stage 1 through stage 4 chronic kidney disease, or unspecified chronic kidney disease: Secondary | ICD-10-CM | POA: Diagnosis not present

## 2013-05-21 DIAGNOSIS — N185 Chronic kidney disease, stage 5: Secondary | ICD-10-CM | POA: Diagnosis not present

## 2013-05-21 DIAGNOSIS — L98499 Non-pressure chronic ulcer of skin of other sites with unspecified severity: Secondary | ICD-10-CM | POA: Diagnosis not present

## 2013-05-21 DIAGNOSIS — E119 Type 2 diabetes mellitus without complications: Secondary | ICD-10-CM | POA: Diagnosis not present

## 2013-05-21 DIAGNOSIS — I2510 Atherosclerotic heart disease of native coronary artery without angina pectoris: Secondary | ICD-10-CM | POA: Diagnosis not present

## 2013-05-21 DIAGNOSIS — I509 Heart failure, unspecified: Secondary | ICD-10-CM | POA: Diagnosis not present

## 2013-05-22 DIAGNOSIS — I2510 Atherosclerotic heart disease of native coronary artery without angina pectoris: Secondary | ICD-10-CM | POA: Diagnosis not present

## 2013-05-22 DIAGNOSIS — E119 Type 2 diabetes mellitus without complications: Secondary | ICD-10-CM | POA: Diagnosis not present

## 2013-05-22 DIAGNOSIS — L98499 Non-pressure chronic ulcer of skin of other sites with unspecified severity: Secondary | ICD-10-CM | POA: Diagnosis not present

## 2013-05-22 DIAGNOSIS — N185 Chronic kidney disease, stage 5: Secondary | ICD-10-CM | POA: Diagnosis not present

## 2013-05-22 DIAGNOSIS — I129 Hypertensive chronic kidney disease with stage 1 through stage 4 chronic kidney disease, or unspecified chronic kidney disease: Secondary | ICD-10-CM | POA: Diagnosis not present

## 2013-05-22 DIAGNOSIS — I509 Heart failure, unspecified: Secondary | ICD-10-CM | POA: Diagnosis not present

## 2013-05-23 DIAGNOSIS — I10 Essential (primary) hypertension: Secondary | ICD-10-CM | POA: Diagnosis not present

## 2013-05-23 DIAGNOSIS — E119 Type 2 diabetes mellitus without complications: Secondary | ICD-10-CM | POA: Diagnosis not present

## 2013-05-23 DIAGNOSIS — E079 Disorder of thyroid, unspecified: Secondary | ICD-10-CM | POA: Diagnosis not present

## 2013-05-23 DIAGNOSIS — T8189XA Other complications of procedures, not elsewhere classified, initial encounter: Secondary | ICD-10-CM | POA: Diagnosis not present

## 2013-05-25 DIAGNOSIS — I2510 Atherosclerotic heart disease of native coronary artery without angina pectoris: Secondary | ICD-10-CM | POA: Diagnosis not present

## 2013-05-25 DIAGNOSIS — L98499 Non-pressure chronic ulcer of skin of other sites with unspecified severity: Secondary | ICD-10-CM | POA: Diagnosis not present

## 2013-05-25 DIAGNOSIS — E119 Type 2 diabetes mellitus without complications: Secondary | ICD-10-CM | POA: Diagnosis not present

## 2013-05-25 DIAGNOSIS — N185 Chronic kidney disease, stage 5: Secondary | ICD-10-CM | POA: Diagnosis not present

## 2013-05-25 DIAGNOSIS — I509 Heart failure, unspecified: Secondary | ICD-10-CM | POA: Diagnosis not present

## 2013-05-25 DIAGNOSIS — I129 Hypertensive chronic kidney disease with stage 1 through stage 4 chronic kidney disease, or unspecified chronic kidney disease: Secondary | ICD-10-CM | POA: Diagnosis not present

## 2013-05-27 DIAGNOSIS — I2510 Atherosclerotic heart disease of native coronary artery without angina pectoris: Secondary | ICD-10-CM | POA: Diagnosis not present

## 2013-05-27 DIAGNOSIS — E119 Type 2 diabetes mellitus without complications: Secondary | ICD-10-CM | POA: Diagnosis not present

## 2013-05-27 DIAGNOSIS — I509 Heart failure, unspecified: Secondary | ICD-10-CM | POA: Diagnosis not present

## 2013-05-27 DIAGNOSIS — L98499 Non-pressure chronic ulcer of skin of other sites with unspecified severity: Secondary | ICD-10-CM | POA: Diagnosis not present

## 2013-05-27 DIAGNOSIS — N185 Chronic kidney disease, stage 5: Secondary | ICD-10-CM | POA: Diagnosis not present

## 2013-05-27 DIAGNOSIS — I129 Hypertensive chronic kidney disease with stage 1 through stage 4 chronic kidney disease, or unspecified chronic kidney disease: Secondary | ICD-10-CM | POA: Diagnosis not present

## 2013-05-28 ENCOUNTER — Other Ambulatory Visit (HOSPITAL_COMMUNITY): Payer: Self-pay | Admitting: *Deleted

## 2013-05-29 ENCOUNTER — Encounter (HOSPITAL_COMMUNITY)
Admission: RE | Admit: 2013-05-29 | Discharge: 2013-05-29 | Disposition: A | Discharge status: Home / Self Care | Payer: Medicare Other | Source: Ambulatory Visit | Attending: Nephrology | Admitting: Nephrology

## 2013-05-29 ENCOUNTER — Encounter (HOSPITAL_BASED_OUTPATIENT_CLINIC_OR_DEPARTMENT_OTHER): Payer: Medicare Other | Attending: General Surgery

## 2013-05-29 DIAGNOSIS — L8993 Pressure ulcer of unspecified site, stage 3: Secondary | ICD-10-CM | POA: Diagnosis not present

## 2013-05-29 DIAGNOSIS — L89109 Pressure ulcer of unspecified part of back, unspecified stage: Secondary | ICD-10-CM | POA: Insufficient documentation

## 2013-05-29 DIAGNOSIS — D638 Anemia in other chronic diseases classified elsewhere: Secondary | ICD-10-CM | POA: Insufficient documentation

## 2013-05-29 DIAGNOSIS — N183 Chronic kidney disease, stage 3 unspecified: Secondary | ICD-10-CM | POA: Insufficient documentation

## 2013-05-29 LAB — POCT HEMOGLOBIN-HEMACUE: Hemoglobin: 12.9 g/dL (ref 12.0–15.0)

## 2013-05-29 MED ORDER — DARBEPOETIN ALFA-POLYSORBATE 100 MCG/0.5ML IJ SOLN: 100.0000 ug | INTRAMUSCULAR | Status: DC

## 2013-05-30 DIAGNOSIS — I509 Heart failure, unspecified: Secondary | ICD-10-CM | POA: Diagnosis not present

## 2013-05-30 DIAGNOSIS — L98499 Non-pressure chronic ulcer of skin of other sites with unspecified severity: Secondary | ICD-10-CM | POA: Diagnosis not present

## 2013-05-30 DIAGNOSIS — I129 Hypertensive chronic kidney disease with stage 1 through stage 4 chronic kidney disease, or unspecified chronic kidney disease: Secondary | ICD-10-CM | POA: Diagnosis not present

## 2013-05-30 DIAGNOSIS — N185 Chronic kidney disease, stage 5: Secondary | ICD-10-CM | POA: Diagnosis not present

## 2013-05-30 DIAGNOSIS — I2510 Atherosclerotic heart disease of native coronary artery without angina pectoris: Secondary | ICD-10-CM | POA: Diagnosis not present

## 2013-05-30 DIAGNOSIS — E119 Type 2 diabetes mellitus without complications: Secondary | ICD-10-CM | POA: Diagnosis not present

## 2013-06-01 DIAGNOSIS — L98499 Non-pressure chronic ulcer of skin of other sites with unspecified severity: Secondary | ICD-10-CM | POA: Diagnosis not present

## 2013-06-01 DIAGNOSIS — I509 Heart failure, unspecified: Secondary | ICD-10-CM | POA: Diagnosis not present

## 2013-06-01 DIAGNOSIS — N185 Chronic kidney disease, stage 5: Secondary | ICD-10-CM | POA: Diagnosis not present

## 2013-06-01 DIAGNOSIS — I129 Hypertensive chronic kidney disease with stage 1 through stage 4 chronic kidney disease, or unspecified chronic kidney disease: Secondary | ICD-10-CM | POA: Diagnosis not present

## 2013-06-01 DIAGNOSIS — I2510 Atherosclerotic heart disease of native coronary artery without angina pectoris: Secondary | ICD-10-CM | POA: Diagnosis not present

## 2013-06-01 DIAGNOSIS — E119 Type 2 diabetes mellitus without complications: Secondary | ICD-10-CM | POA: Diagnosis not present

## 2013-06-03 DIAGNOSIS — I2510 Atherosclerotic heart disease of native coronary artery without angina pectoris: Secondary | ICD-10-CM | POA: Diagnosis not present

## 2013-06-03 DIAGNOSIS — I129 Hypertensive chronic kidney disease with stage 1 through stage 4 chronic kidney disease, or unspecified chronic kidney disease: Secondary | ICD-10-CM | POA: Diagnosis not present

## 2013-06-03 DIAGNOSIS — N185 Chronic kidney disease, stage 5: Secondary | ICD-10-CM | POA: Diagnosis not present

## 2013-06-03 DIAGNOSIS — L98499 Non-pressure chronic ulcer of skin of other sites with unspecified severity: Secondary | ICD-10-CM | POA: Diagnosis not present

## 2013-06-03 DIAGNOSIS — E119 Type 2 diabetes mellitus without complications: Secondary | ICD-10-CM | POA: Diagnosis not present

## 2013-06-03 DIAGNOSIS — I509 Heart failure, unspecified: Secondary | ICD-10-CM | POA: Diagnosis not present

## 2013-06-04 DIAGNOSIS — R809 Proteinuria, unspecified: Secondary | ICD-10-CM | POA: Diagnosis not present

## 2013-06-04 DIAGNOSIS — N2581 Secondary hyperparathyroidism of renal origin: Secondary | ICD-10-CM | POA: Diagnosis not present

## 2013-06-04 DIAGNOSIS — I129 Hypertensive chronic kidney disease with stage 1 through stage 4 chronic kidney disease, or unspecified chronic kidney disease: Secondary | ICD-10-CM | POA: Diagnosis not present

## 2013-06-04 DIAGNOSIS — N183 Chronic kidney disease, stage 3 unspecified: Secondary | ICD-10-CM | POA: Diagnosis not present

## 2013-06-05 DIAGNOSIS — L89109 Pressure ulcer of unspecified part of back, unspecified stage: Secondary | ICD-10-CM | POA: Diagnosis not present

## 2013-06-05 DIAGNOSIS — E119 Type 2 diabetes mellitus without complications: Secondary | ICD-10-CM | POA: Diagnosis not present

## 2013-06-05 DIAGNOSIS — N185 Chronic kidney disease, stage 5: Secondary | ICD-10-CM | POA: Diagnosis not present

## 2013-06-05 DIAGNOSIS — L98499 Non-pressure chronic ulcer of skin of other sites with unspecified severity: Secondary | ICD-10-CM | POA: Diagnosis not present

## 2013-06-05 DIAGNOSIS — I129 Hypertensive chronic kidney disease with stage 1 through stage 4 chronic kidney disease, or unspecified chronic kidney disease: Secondary | ICD-10-CM | POA: Diagnosis not present

## 2013-06-05 DIAGNOSIS — I509 Heart failure, unspecified: Secondary | ICD-10-CM | POA: Diagnosis not present

## 2013-06-05 DIAGNOSIS — I2510 Atherosclerotic heart disease of native coronary artery without angina pectoris: Secondary | ICD-10-CM | POA: Diagnosis not present

## 2013-06-05 DIAGNOSIS — L8993 Pressure ulcer of unspecified site, stage 3: Secondary | ICD-10-CM | POA: Diagnosis not present

## 2013-06-06 DIAGNOSIS — I509 Heart failure, unspecified: Secondary | ICD-10-CM | POA: Diagnosis not present

## 2013-06-06 DIAGNOSIS — E119 Type 2 diabetes mellitus without complications: Secondary | ICD-10-CM | POA: Diagnosis not present

## 2013-06-06 DIAGNOSIS — N183 Chronic kidney disease, stage 3 unspecified: Secondary | ICD-10-CM | POA: Diagnosis not present

## 2013-06-06 DIAGNOSIS — L8993 Pressure ulcer of unspecified site, stage 3: Secondary | ICD-10-CM | POA: Diagnosis not present

## 2013-06-06 DIAGNOSIS — I129 Hypertensive chronic kidney disease with stage 1 through stage 4 chronic kidney disease, or unspecified chronic kidney disease: Secondary | ICD-10-CM | POA: Diagnosis not present

## 2013-06-06 DIAGNOSIS — L89109 Pressure ulcer of unspecified part of back, unspecified stage: Secondary | ICD-10-CM | POA: Diagnosis not present

## 2013-06-08 DIAGNOSIS — E119 Type 2 diabetes mellitus without complications: Secondary | ICD-10-CM | POA: Diagnosis not present

## 2013-06-08 DIAGNOSIS — I129 Hypertensive chronic kidney disease with stage 1 through stage 4 chronic kidney disease, or unspecified chronic kidney disease: Secondary | ICD-10-CM | POA: Diagnosis not present

## 2013-06-08 DIAGNOSIS — I509 Heart failure, unspecified: Secondary | ICD-10-CM | POA: Diagnosis not present

## 2013-06-08 DIAGNOSIS — N183 Chronic kidney disease, stage 3 unspecified: Secondary | ICD-10-CM | POA: Diagnosis not present

## 2013-06-08 DIAGNOSIS — L89109 Pressure ulcer of unspecified part of back, unspecified stage: Secondary | ICD-10-CM | POA: Diagnosis not present

## 2013-06-08 DIAGNOSIS — L8993 Pressure ulcer of unspecified site, stage 3: Secondary | ICD-10-CM | POA: Diagnosis not present

## 2013-06-11 ENCOUNTER — Other Ambulatory Visit (HOSPITAL_COMMUNITY): Payer: Self-pay | Admitting: *Deleted

## 2013-06-12 ENCOUNTER — Encounter (HOSPITAL_COMMUNITY)
Admission: RE | Admit: 2013-06-12 | Discharge: 2013-06-12 | Disposition: A | Discharge status: Home / Self Care | Payer: Medicare Other | Source: Ambulatory Visit | Attending: Nephrology | Admitting: Nephrology

## 2013-06-12 DIAGNOSIS — L8993 Pressure ulcer of unspecified site, stage 3: Secondary | ICD-10-CM | POA: Diagnosis not present

## 2013-06-12 DIAGNOSIS — L89109 Pressure ulcer of unspecified part of back, unspecified stage: Secondary | ICD-10-CM | POA: Diagnosis not present

## 2013-06-12 LAB — POCT HEMOGLOBIN-HEMACUE: HEMOGLOBIN: 11.4 g/dL — AB (ref 12.0–15.0)

## 2013-06-12 MED ORDER — DARBEPOETIN ALFA-POLYSORBATE 100 MCG/0.5ML IJ SOLN
INTRAMUSCULAR | Status: AC
  Filled 2013-06-12: qty 0.5

## 2013-06-12 MED ORDER — DARBEPOETIN ALFA-POLYSORBATE 100 MCG/0.5ML IJ SOLN
100.0000 ug | INTRAMUSCULAR | Status: DC
  Administered 2013-06-12: 100 ug via SUBCUTANEOUS

## 2013-06-13 DIAGNOSIS — I509 Heart failure, unspecified: Secondary | ICD-10-CM | POA: Diagnosis not present

## 2013-06-13 DIAGNOSIS — I129 Hypertensive chronic kidney disease with stage 1 through stage 4 chronic kidney disease, or unspecified chronic kidney disease: Secondary | ICD-10-CM | POA: Diagnosis not present

## 2013-06-13 DIAGNOSIS — L8993 Pressure ulcer of unspecified site, stage 3: Secondary | ICD-10-CM | POA: Diagnosis not present

## 2013-06-13 DIAGNOSIS — E119 Type 2 diabetes mellitus without complications: Secondary | ICD-10-CM | POA: Diagnosis not present

## 2013-06-13 DIAGNOSIS — L89109 Pressure ulcer of unspecified part of back, unspecified stage: Secondary | ICD-10-CM | POA: Diagnosis not present

## 2013-06-13 DIAGNOSIS — N183 Chronic kidney disease, stage 3 unspecified: Secondary | ICD-10-CM | POA: Diagnosis not present

## 2013-06-15 DIAGNOSIS — E119 Type 2 diabetes mellitus without complications: Secondary | ICD-10-CM | POA: Diagnosis not present

## 2013-06-15 DIAGNOSIS — L8993 Pressure ulcer of unspecified site, stage 3: Secondary | ICD-10-CM | POA: Diagnosis not present

## 2013-06-15 DIAGNOSIS — L89109 Pressure ulcer of unspecified part of back, unspecified stage: Secondary | ICD-10-CM | POA: Diagnosis not present

## 2013-06-15 DIAGNOSIS — I129 Hypertensive chronic kidney disease with stage 1 through stage 4 chronic kidney disease, or unspecified chronic kidney disease: Secondary | ICD-10-CM | POA: Diagnosis not present

## 2013-06-15 DIAGNOSIS — N183 Chronic kidney disease, stage 3 unspecified: Secondary | ICD-10-CM | POA: Diagnosis not present

## 2013-06-15 DIAGNOSIS — I509 Heart failure, unspecified: Secondary | ICD-10-CM | POA: Diagnosis not present

## 2013-06-18 DIAGNOSIS — L89109 Pressure ulcer of unspecified part of back, unspecified stage: Secondary | ICD-10-CM | POA: Diagnosis not present

## 2013-06-18 DIAGNOSIS — E119 Type 2 diabetes mellitus without complications: Secondary | ICD-10-CM | POA: Diagnosis not present

## 2013-06-18 DIAGNOSIS — N183 Chronic kidney disease, stage 3 unspecified: Secondary | ICD-10-CM | POA: Diagnosis not present

## 2013-06-18 DIAGNOSIS — I129 Hypertensive chronic kidney disease with stage 1 through stage 4 chronic kidney disease, or unspecified chronic kidney disease: Secondary | ICD-10-CM | POA: Diagnosis not present

## 2013-06-18 DIAGNOSIS — L8993 Pressure ulcer of unspecified site, stage 3: Secondary | ICD-10-CM | POA: Diagnosis not present

## 2013-06-18 DIAGNOSIS — I509 Heart failure, unspecified: Secondary | ICD-10-CM | POA: Diagnosis not present

## 2013-06-20 DIAGNOSIS — I129 Hypertensive chronic kidney disease with stage 1 through stage 4 chronic kidney disease, or unspecified chronic kidney disease: Secondary | ICD-10-CM | POA: Diagnosis not present

## 2013-06-20 DIAGNOSIS — I509 Heart failure, unspecified: Secondary | ICD-10-CM | POA: Diagnosis not present

## 2013-06-20 DIAGNOSIS — N183 Chronic kidney disease, stage 3 unspecified: Secondary | ICD-10-CM | POA: Diagnosis not present

## 2013-06-20 DIAGNOSIS — E119 Type 2 diabetes mellitus without complications: Secondary | ICD-10-CM | POA: Diagnosis not present

## 2013-06-20 DIAGNOSIS — L8993 Pressure ulcer of unspecified site, stage 3: Secondary | ICD-10-CM | POA: Diagnosis not present

## 2013-06-20 DIAGNOSIS — L89109 Pressure ulcer of unspecified part of back, unspecified stage: Secondary | ICD-10-CM | POA: Diagnosis not present

## 2013-06-22 DIAGNOSIS — L8993 Pressure ulcer of unspecified site, stage 3: Secondary | ICD-10-CM | POA: Diagnosis not present

## 2013-06-22 DIAGNOSIS — E119 Type 2 diabetes mellitus without complications: Secondary | ICD-10-CM | POA: Diagnosis not present

## 2013-06-22 DIAGNOSIS — N183 Chronic kidney disease, stage 3 unspecified: Secondary | ICD-10-CM | POA: Diagnosis not present

## 2013-06-22 DIAGNOSIS — I129 Hypertensive chronic kidney disease with stage 1 through stage 4 chronic kidney disease, or unspecified chronic kidney disease: Secondary | ICD-10-CM | POA: Diagnosis not present

## 2013-06-22 DIAGNOSIS — L89109 Pressure ulcer of unspecified part of back, unspecified stage: Secondary | ICD-10-CM | POA: Diagnosis not present

## 2013-06-22 DIAGNOSIS — I509 Heart failure, unspecified: Secondary | ICD-10-CM | POA: Diagnosis not present

## 2013-06-26 ENCOUNTER — Encounter (HOSPITAL_BASED_OUTPATIENT_CLINIC_OR_DEPARTMENT_OTHER): Payer: Medicare Other | Attending: General Surgery

## 2013-06-26 DIAGNOSIS — T8189XA Other complications of procedures, not elsewhere classified, initial encounter: Secondary | ICD-10-CM | POA: Diagnosis not present

## 2013-06-26 DIAGNOSIS — Y839 Surgical procedure, unspecified as the cause of abnormal reaction of the patient, or of later complication, without mention of misadventure at the time of the procedure: Secondary | ICD-10-CM | POA: Insufficient documentation

## 2013-06-27 DIAGNOSIS — N183 Chronic kidney disease, stage 3 unspecified: Secondary | ICD-10-CM | POA: Diagnosis not present

## 2013-06-27 DIAGNOSIS — L89109 Pressure ulcer of unspecified part of back, unspecified stage: Secondary | ICD-10-CM | POA: Diagnosis not present

## 2013-06-27 DIAGNOSIS — I509 Heart failure, unspecified: Secondary | ICD-10-CM | POA: Diagnosis not present

## 2013-06-27 DIAGNOSIS — E119 Type 2 diabetes mellitus without complications: Secondary | ICD-10-CM | POA: Diagnosis not present

## 2013-06-27 DIAGNOSIS — L8993 Pressure ulcer of unspecified site, stage 3: Secondary | ICD-10-CM | POA: Diagnosis not present

## 2013-06-27 DIAGNOSIS — I129 Hypertensive chronic kidney disease with stage 1 through stage 4 chronic kidney disease, or unspecified chronic kidney disease: Secondary | ICD-10-CM | POA: Diagnosis not present

## 2013-06-28 ENCOUNTER — Other Ambulatory Visit (INDEPENDENT_AMBULATORY_CARE_PROVIDER_SITE_OTHER): Payer: Medicare Other

## 2013-06-28 DIAGNOSIS — R7989 Other specified abnormal findings of blood chemistry: Secondary | ICD-10-CM

## 2013-06-28 NOTE — Progress Notes (Signed)
Patient came in with orders from [place] from [facility]

## 2013-06-29 DIAGNOSIS — I509 Heart failure, unspecified: Secondary | ICD-10-CM | POA: Diagnosis not present

## 2013-06-29 DIAGNOSIS — N183 Chronic kidney disease, stage 3 unspecified: Secondary | ICD-10-CM | POA: Diagnosis not present

## 2013-06-29 DIAGNOSIS — L8993 Pressure ulcer of unspecified site, stage 3: Secondary | ICD-10-CM | POA: Diagnosis not present

## 2013-06-29 DIAGNOSIS — I129 Hypertensive chronic kidney disease with stage 1 through stage 4 chronic kidney disease, or unspecified chronic kidney disease: Secondary | ICD-10-CM | POA: Diagnosis not present

## 2013-06-29 DIAGNOSIS — E119 Type 2 diabetes mellitus without complications: Secondary | ICD-10-CM | POA: Diagnosis not present

## 2013-06-29 DIAGNOSIS — L89109 Pressure ulcer of unspecified part of back, unspecified stage: Secondary | ICD-10-CM | POA: Diagnosis not present

## 2013-06-29 LAB — BMP8+EGFR
BUN/Creatinine Ratio: 15 (ref 11–26)
BUN: 22 mg/dL (ref 8–27)
CALCIUM: 9.4 mg/dL (ref 8.7–10.3)
CO2: 25 mmol/L (ref 18–29)
CREATININE: 1.47 mg/dL — AB (ref 0.57–1.00)
Chloride: 99 mmol/L (ref 97–108)
GFR, EST AFRICAN AMERICAN: 42 mL/min/{1.73_m2} — AB (ref >59)
GFR, EST NON AFRICAN AMERICAN: 36 mL/min/{1.73_m2} — AB (ref >59)
GLUCOSE: 156 mg/dL — AB (ref 65–99)
Potassium: 4.6 mmol/L (ref 3.5–5.2)
Sodium: 142 mmol/L (ref 134–144)

## 2013-07-02 ENCOUNTER — Telehealth: Payer: Self-pay | Admitting: *Deleted

## 2013-07-02 DIAGNOSIS — I129 Hypertensive chronic kidney disease with stage 1 through stage 4 chronic kidney disease, or unspecified chronic kidney disease: Secondary | ICD-10-CM | POA: Diagnosis not present

## 2013-07-02 DIAGNOSIS — E119 Type 2 diabetes mellitus without complications: Secondary | ICD-10-CM | POA: Diagnosis not present

## 2013-07-02 DIAGNOSIS — L8993 Pressure ulcer of unspecified site, stage 3: Secondary | ICD-10-CM | POA: Diagnosis not present

## 2013-07-02 DIAGNOSIS — L89109 Pressure ulcer of unspecified part of back, unspecified stage: Secondary | ICD-10-CM | POA: Diagnosis not present

## 2013-07-02 DIAGNOSIS — N183 Chronic kidney disease, stage 3 unspecified: Secondary | ICD-10-CM | POA: Diagnosis not present

## 2013-07-02 DIAGNOSIS — I509 Heart failure, unspecified: Secondary | ICD-10-CM | POA: Diagnosis not present

## 2013-07-02 NOTE — Telephone Encounter (Signed)
Aware of results. Please send to [facility].

## 2013-07-02 NOTE — Telephone Encounter (Signed)
Message copied by Shelbie Ammons on Mon Jul 02, 2013  3:47 PM ------      Message from: Chevis Pretty      Created: Sat Jun 30, 2013 12:16 PM       Creatine is stable ------

## 2013-07-04 DIAGNOSIS — I509 Heart failure, unspecified: Secondary | ICD-10-CM | POA: Diagnosis not present

## 2013-07-04 DIAGNOSIS — I129 Hypertensive chronic kidney disease with stage 1 through stage 4 chronic kidney disease, or unspecified chronic kidney disease: Secondary | ICD-10-CM | POA: Diagnosis not present

## 2013-07-04 DIAGNOSIS — N183 Chronic kidney disease, stage 3 unspecified: Secondary | ICD-10-CM | POA: Diagnosis not present

## 2013-07-04 DIAGNOSIS — E119 Type 2 diabetes mellitus without complications: Secondary | ICD-10-CM | POA: Diagnosis not present

## 2013-07-04 DIAGNOSIS — L89109 Pressure ulcer of unspecified part of back, unspecified stage: Secondary | ICD-10-CM | POA: Diagnosis not present

## 2013-07-04 DIAGNOSIS — L8993 Pressure ulcer of unspecified site, stage 3: Secondary | ICD-10-CM | POA: Diagnosis not present

## 2013-07-06 DIAGNOSIS — E119 Type 2 diabetes mellitus without complications: Secondary | ICD-10-CM | POA: Diagnosis not present

## 2013-07-06 DIAGNOSIS — I509 Heart failure, unspecified: Secondary | ICD-10-CM | POA: Diagnosis not present

## 2013-07-06 DIAGNOSIS — L89109 Pressure ulcer of unspecified part of back, unspecified stage: Secondary | ICD-10-CM | POA: Diagnosis not present

## 2013-07-06 DIAGNOSIS — L8993 Pressure ulcer of unspecified site, stage 3: Secondary | ICD-10-CM | POA: Diagnosis not present

## 2013-07-06 DIAGNOSIS — I129 Hypertensive chronic kidney disease with stage 1 through stage 4 chronic kidney disease, or unspecified chronic kidney disease: Secondary | ICD-10-CM | POA: Diagnosis not present

## 2013-07-06 DIAGNOSIS — N183 Chronic kidney disease, stage 3 unspecified: Secondary | ICD-10-CM | POA: Diagnosis not present

## 2013-07-09 ENCOUNTER — Encounter (HOSPITAL_COMMUNITY)
Admission: RE | Admit: 2013-07-09 | Discharge: 2013-07-09 | Disposition: A | Discharge status: Home / Self Care | Payer: Medicare Other | Source: Ambulatory Visit | Attending: Nephrology | Admitting: Nephrology

## 2013-07-09 DIAGNOSIS — E1139 Type 2 diabetes mellitus with other diabetic ophthalmic complication: Secondary | ICD-10-CM | POA: Diagnosis not present

## 2013-07-09 DIAGNOSIS — D638 Anemia in other chronic diseases classified elsewhere: Secondary | ICD-10-CM | POA: Insufficient documentation

## 2013-07-09 DIAGNOSIS — N183 Chronic kidney disease, stage 3 unspecified: Secondary | ICD-10-CM | POA: Insufficient documentation

## 2013-07-09 DIAGNOSIS — E11359 Type 2 diabetes mellitus with proliferative diabetic retinopathy without macular edema: Secondary | ICD-10-CM | POA: Diagnosis not present

## 2013-07-09 DIAGNOSIS — L89109 Pressure ulcer of unspecified part of back, unspecified stage: Secondary | ICD-10-CM | POA: Diagnosis not present

## 2013-07-09 DIAGNOSIS — I509 Heart failure, unspecified: Secondary | ICD-10-CM | POA: Diagnosis not present

## 2013-07-09 DIAGNOSIS — H353190 Nonexudative age-related macular degeneration, unspecified eye, stage unspecified: Secondary | ICD-10-CM | POA: Diagnosis not present

## 2013-07-09 DIAGNOSIS — I129 Hypertensive chronic kidney disease with stage 1 through stage 4 chronic kidney disease, or unspecified chronic kidney disease: Secondary | ICD-10-CM | POA: Diagnosis not present

## 2013-07-09 DIAGNOSIS — L8993 Pressure ulcer of unspecified site, stage 3: Secondary | ICD-10-CM | POA: Diagnosis not present

## 2013-07-09 DIAGNOSIS — E119 Type 2 diabetes mellitus without complications: Secondary | ICD-10-CM | POA: Diagnosis not present

## 2013-07-09 LAB — POCT HEMOGLOBIN-HEMACUE: HEMOGLOBIN: 13.4 g/dL (ref 12.0–15.0)

## 2013-07-09 MED ORDER — DARBEPOETIN ALFA-POLYSORBATE 100 MCG/0.5ML IJ SOLN: 100.0000 ug | INTRAMUSCULAR | Status: DC

## 2013-07-10 ENCOUNTER — Encounter (HOSPITAL_COMMUNITY): Payer: Medicare Other

## 2013-07-11 DIAGNOSIS — L8993 Pressure ulcer of unspecified site, stage 3: Secondary | ICD-10-CM | POA: Diagnosis not present

## 2013-07-11 DIAGNOSIS — I509 Heart failure, unspecified: Secondary | ICD-10-CM | POA: Diagnosis not present

## 2013-07-11 DIAGNOSIS — E119 Type 2 diabetes mellitus without complications: Secondary | ICD-10-CM | POA: Diagnosis not present

## 2013-07-11 DIAGNOSIS — N183 Chronic kidney disease, stage 3 unspecified: Secondary | ICD-10-CM | POA: Diagnosis not present

## 2013-07-11 DIAGNOSIS — L89109 Pressure ulcer of unspecified part of back, unspecified stage: Secondary | ICD-10-CM | POA: Diagnosis not present

## 2013-07-11 DIAGNOSIS — I129 Hypertensive chronic kidney disease with stage 1 through stage 4 chronic kidney disease, or unspecified chronic kidney disease: Secondary | ICD-10-CM | POA: Diagnosis not present

## 2013-07-13 DIAGNOSIS — N183 Chronic kidney disease, stage 3 unspecified: Secondary | ICD-10-CM | POA: Diagnosis not present

## 2013-07-13 DIAGNOSIS — I509 Heart failure, unspecified: Secondary | ICD-10-CM | POA: Diagnosis not present

## 2013-07-13 DIAGNOSIS — E119 Type 2 diabetes mellitus without complications: Secondary | ICD-10-CM | POA: Diagnosis not present

## 2013-07-13 DIAGNOSIS — I129 Hypertensive chronic kidney disease with stage 1 through stage 4 chronic kidney disease, or unspecified chronic kidney disease: Secondary | ICD-10-CM | POA: Diagnosis not present

## 2013-07-13 DIAGNOSIS — L8993 Pressure ulcer of unspecified site, stage 3: Secondary | ICD-10-CM | POA: Diagnosis not present

## 2013-07-13 DIAGNOSIS — L89109 Pressure ulcer of unspecified part of back, unspecified stage: Secondary | ICD-10-CM | POA: Diagnosis not present

## 2013-07-16 DIAGNOSIS — I509 Heart failure, unspecified: Secondary | ICD-10-CM | POA: Diagnosis not present

## 2013-07-16 DIAGNOSIS — L8993 Pressure ulcer of unspecified site, stage 3: Secondary | ICD-10-CM | POA: Diagnosis not present

## 2013-07-16 DIAGNOSIS — L89109 Pressure ulcer of unspecified part of back, unspecified stage: Secondary | ICD-10-CM | POA: Diagnosis not present

## 2013-07-16 DIAGNOSIS — N183 Chronic kidney disease, stage 3 unspecified: Secondary | ICD-10-CM | POA: Diagnosis not present

## 2013-07-16 DIAGNOSIS — I129 Hypertensive chronic kidney disease with stage 1 through stage 4 chronic kidney disease, or unspecified chronic kidney disease: Secondary | ICD-10-CM | POA: Diagnosis not present

## 2013-07-16 DIAGNOSIS — E119 Type 2 diabetes mellitus without complications: Secondary | ICD-10-CM | POA: Diagnosis not present

## 2013-07-18 DIAGNOSIS — E119 Type 2 diabetes mellitus without complications: Secondary | ICD-10-CM | POA: Diagnosis not present

## 2013-07-18 DIAGNOSIS — I509 Heart failure, unspecified: Secondary | ICD-10-CM | POA: Diagnosis not present

## 2013-07-18 DIAGNOSIS — L89109 Pressure ulcer of unspecified part of back, unspecified stage: Secondary | ICD-10-CM | POA: Diagnosis not present

## 2013-07-18 DIAGNOSIS — L8993 Pressure ulcer of unspecified site, stage 3: Secondary | ICD-10-CM | POA: Diagnosis not present

## 2013-07-18 DIAGNOSIS — N183 Chronic kidney disease, stage 3 unspecified: Secondary | ICD-10-CM | POA: Diagnosis not present

## 2013-07-18 DIAGNOSIS — I129 Hypertensive chronic kidney disease with stage 1 through stage 4 chronic kidney disease, or unspecified chronic kidney disease: Secondary | ICD-10-CM | POA: Diagnosis not present

## 2013-07-20 ENCOUNTER — Ambulatory Visit (INDEPENDENT_AMBULATORY_CARE_PROVIDER_SITE_OTHER): Payer: Medicare Other | Admitting: Nurse Practitioner

## 2013-07-20 ENCOUNTER — Encounter: Payer: Self-pay | Admitting: Nurse Practitioner

## 2013-07-20 VITALS — BP 212/83 | HR 64 | Temp 97.5°F | Ht 62.0 in | Wt 181.5 lb

## 2013-07-20 DIAGNOSIS — E119 Type 2 diabetes mellitus without complications: Secondary | ICD-10-CM | POA: Diagnosis not present

## 2013-07-20 DIAGNOSIS — L8993 Pressure ulcer of unspecified site, stage 3: Secondary | ICD-10-CM | POA: Diagnosis not present

## 2013-07-20 DIAGNOSIS — IMO0001 Reserved for inherently not codable concepts without codable children: Secondary | ICD-10-CM | POA: Diagnosis not present

## 2013-07-20 DIAGNOSIS — E1165 Type 2 diabetes mellitus with hyperglycemia: Secondary | ICD-10-CM

## 2013-07-20 DIAGNOSIS — F329 Major depressive disorder, single episode, unspecified: Secondary | ICD-10-CM

## 2013-07-20 DIAGNOSIS — N183 Chronic kidney disease, stage 3 unspecified: Secondary | ICD-10-CM | POA: Diagnosis not present

## 2013-07-20 DIAGNOSIS — E1122 Type 2 diabetes mellitus with diabetic chronic kidney disease: Secondary | ICD-10-CM | POA: Insufficient documentation

## 2013-07-20 DIAGNOSIS — L89109 Pressure ulcer of unspecified part of back, unspecified stage: Secondary | ICD-10-CM | POA: Diagnosis not present

## 2013-07-20 DIAGNOSIS — I10 Essential (primary) hypertension: Secondary | ICD-10-CM

## 2013-07-20 DIAGNOSIS — I129 Hypertensive chronic kidney disease with stage 1 through stage 4 chronic kidney disease, or unspecified chronic kidney disease: Secondary | ICD-10-CM | POA: Diagnosis not present

## 2013-07-20 DIAGNOSIS — Z794 Long term (current) use of insulin: Secondary | ICD-10-CM | POA: Insufficient documentation

## 2013-07-20 DIAGNOSIS — E785 Hyperlipidemia, unspecified: Secondary | ICD-10-CM

## 2013-07-20 DIAGNOSIS — F3289 Other specified depressive episodes: Secondary | ICD-10-CM

## 2013-07-20 DIAGNOSIS — F411 Generalized anxiety disorder: Secondary | ICD-10-CM

## 2013-07-20 DIAGNOSIS — I509 Heart failure, unspecified: Secondary | ICD-10-CM | POA: Diagnosis not present

## 2013-07-20 LAB — POCT GLYCOSYLATED HEMOGLOBIN (HGB A1C): HEMOGLOBIN A1C: 8.6

## 2013-07-20 MED ORDER — GLIMEPIRIDE 4 MG PO TABS: 4.0000 mg | ORAL_TABLET | Freq: Two times a day (BID) | ORAL | Status: DC

## 2013-07-20 MED ORDER — ATORVASTATIN CALCIUM 40 MG PO TABS: 40.0000 mg | ORAL_TABLET | Freq: Every day | ORAL | Status: DC

## 2013-07-20 MED ORDER — PIOGLITAZONE HCL 30 MG PO TABS: 30.0000 mg | ORAL_TABLET | Freq: Every day | ORAL | Status: DC

## 2013-07-20 MED ORDER — LEVOTHYROXINE SODIUM 75 MCG PO TABS: 75.0000 ug | ORAL_TABLET | Freq: Every day | ORAL | Status: DC

## 2013-07-20 MED ORDER — FUROSEMIDE 40 MG PO TABS
40.0000 mg | ORAL_TABLET | Freq: Every day | ORAL | Status: DC
Start: 2013-07-20 — End: 2013-10-22

## 2013-07-20 MED ORDER — CLONIDINE HCL 0.2 MG PO TABS: 0.2000 mg | ORAL_TABLET | Freq: Two times a day (BID) | ORAL | Status: DC

## 2013-07-20 MED ORDER — PANTOPRAZOLE SODIUM 40 MG PO TBEC: 40.0000 mg | DELAYED_RELEASE_TABLET | Freq: Two times a day (BID) | ORAL | Status: DC

## 2013-07-20 MED ORDER — LOSARTAN POTASSIUM 100 MG PO TABS: 100.0000 mg | ORAL_TABLET | Freq: Every day | ORAL | Status: DC

## 2013-07-20 MED ORDER — METOPROLOL SUCCINATE ER 50 MG PO TB24
50.0000 mg | ORAL_TABLET | Freq: Every day | ORAL | Status: DC
Start: 2013-07-20 — End: 2013-10-22

## 2013-07-20 NOTE — Progress Notes (Signed)
Subjective:    Patient ID: [name], female    DOB: Oct 28, 1945, 68 y.o.   MRN: [id number]  Patient here today for follow up- she is doing well without complaints today.  Diabetes She presents for her follow-up diabetic visit. She has type 2 diabetes mellitus. There are no hypoglycemic associated symptoms. Pertinent negatives for hypoglycemia include no headaches. Associated symptoms include visual change. Pertinent negatives for diabetes include no blurred vision, no chest pain, no fatigue, no foot paresthesias, no foot ulcerations, no polydipsia and no polyphagia. There are no hypoglycemic complications. There are no diabetic complications. Risk factors for coronary artery disease include diabetes mellitus, dyslipidemia, hypertension and post-menopausal. Current diabetic treatment includes oral agent (monotherapy). She is compliant with treatment all of the time. Her weight is stable. She is following a diabetic diet. She has not had a previous visit with a dietician. She participates in exercise daily. Her breakfast blood glucose is taken between 8-9 am. Her breakfast blood glucose range is generally 90-110 mg/dl. An ACE inhibitor/angiotensin II receptor blocker is not being taken. She does not see a podiatrist.Eye exam is current (March 2014).  Hyperlipidemia This is a chronic problem. The current episode started more than 1 year ago. The problem is uncontrolled. Recent lipid tests were reviewed and are high. Exacerbating diseases include chronic renal disease, diabetes and hypothyroidism. Factors aggravating her hyperlipidemia include beta blockers. Pertinent negatives include no chest pain, leg pain, myalgias or shortness of breath. Current antihyperlipidemic treatment includes statins. The current treatment provides moderate improvement of lipids. Risk factors for coronary artery disease include diabetes mellitus, dyslipidemia, family history, hypertension, obesity and post-menopausal.   Hypertension This is a chronic problem. The current episode started more than 1 year ago. The problem has been waxing and waning since onset. The problem is uncontrolled. Pertinent negatives include no anxiety, blurred vision, chest pain, headaches, palpitations, peripheral edema or shortness of breath. Risk factors for coronary artery disease include diabetes mellitus, dyslipidemia, family history and post-menopausal state. Past treatments include beta blockers. The current treatment provides mild improvement. Hypertensive end-organ damage includes kidney disease and a thyroid problem. Identifiable causes of hypertension include chronic renal disease.  Gastrophageal Reflux She reports no chest pain, no coughing, no heartburn, no nausea or no sore throat. This is a chronic problem. The current episode started more than 1 year ago. The problem occurs rarely. The problem has been resolved. The symptoms are aggravated by certain foods. Pertinent negatives include no fatigue or muscle weakness. She has tried a PPI for the symptoms. The treatment provided significant relief.  Thyroid Problem Presents for follow-up visit. Symptoms include dry skin, hair loss and visual change. Patient reports no constipation, depressed mood, diaphoresis, fatigue, leg swelling or palpitations. The symptoms have been stable. Past treatments include levothyroxine. The treatment provided significant relief. Her past medical history is significant for diabetes and hyperlipidemia.   *Review of Systems  Constitutional: Negative for diaphoresis and fatigue.  HENT: Negative for sore throat.   Eyes: Negative for blurred vision.  Respiratory: Negative for cough and shortness of breath.   Cardiovascular: Negative for chest pain and palpitations.  Gastrointestinal: Negative for heartburn, nausea and constipation.  Endocrine: Negative for polydipsia and polyphagia.  Musculoskeletal: Negative for muscle weakness and myalgias.   Neurological: Negative for headaches.  All other systems reviewed and are negative.       Objective:   Physical Exam  Vitals reviewed. Constitutional: She is oriented to person, place, and time. She appears well-developed and  well-nourished.  HENT:  Head: Normocephalic.  Right Ear: External ear normal.  Mouth/Throat: Oropharynx is clear and moist.  Eyes: Pupils are equal, round, and reactive to light.  Neck: Normal range of motion. Neck supple. No thyromegaly present.  Cardiovascular: Normal rate, regular rhythm, normal heart sounds and intact distal pulses.   Pulmonary/Chest: Effort normal and breath sounds normal.  Abdominal: Soft. Bowel sounds are normal. She exhibits no distension. There is no tenderness.  Musculoskeletal: Normal range of motion. She exhibits edema (trace amt bil ankles).  Pt broke femur May 2014   Neurological: She is alert and oriented to person, place, and time.  Skin: Skin is warm and dry.  Psychiatric: She has a normal mood and affect. Her behavior is normal. Judgment and thought content normal.  diabetic foot exam within normal limits- (+) monofilament bilaterally   BP 212/83  Pulse 64  Temp(Src) 97.5 F (36.4 C) (Oral)  Ht '5\' 2"'  (1.575 m)  Wt 181 lb 8 oz (82.328 kg)  BMI 33.19 kg/m2   Results for orders placed during the hospital encounter of 07/09/13  POCT HEMOGLOBIN-HEMACUE      Result Value Ref Range   Hemoglobin 13.4  12.0 - 15.0 g/dL       Assessment & Plan:    1. DM   2. HYPERLIPIDEMIA   3. HYPERTENSION   4. Type II or unspecified type diabetes mellitus without mention of complication, uncontrolled   5. DEPRESSION   6. ANXIETY    Orders Placed This Encounter  Procedures  . CMP14+EGFR  . NMR, lipoprofile  . POCT glycosylated hemoglobin (Hb A1C)   Meds ordered this encounter  Medications  . glimepiride (AMARYL) 4 MG tablet    Sig: Take 1 tablet (4 mg total) by mouth 2 (two) times daily.    Dispense:  180 tablet     Refill:  1    Order Specific Question:  Supervising Provider    Answer:  Chipper Herb [[id number]]  . atorvastatin (LIPITOR) 40 MG tablet    Sig: Take 1 tablet (40 mg total) by mouth daily.    Dispense:  90 tablet    Refill:  1    Order Specific Question:  Supervising Provider    Answer:  Chipper Herb [[id number]]  . cloNIDine (CATAPRES) 0.2 MG tablet    Sig: Take 1 tablet (0.2 mg total) by mouth 2 (two) times daily.    Dispense:  90 tablet    Refill:  1    Order Specific Question:  Supervising Provider    Answer:  Chipper Herb [[id number]]  . furosemide (LASIX) 40 MG tablet    Sig: Take 1 tablet (40 mg total) by mouth daily.    Dispense:  90 tablet    Refill:  1    Order Specific Question:  Supervising Provider    Answer:  Chipper Herb [[id number]]  . levothyroxine (SYNTHROID, LEVOTHROID) 75 MCG tablet    Sig: Take 1 tablet (75 mcg total) by mouth daily before breakfast.    Dispense:  90 tablet    Refill:  1    Order Specific Question:  Supervising Provider    Answer:  Chipper Herb [[id number]]  . losartan (COZAAR) 100 MG tablet    Sig: Take 1 tablet (100 mg total) by mouth daily.    Dispense:  90 tablet    Refill:  1    Order Specific Question:  Supervising Provider    Answer:  Chipper Herb [  [id number]]  . metoprolol succinate (TOPROL-XL) 50 MG 24 hr tablet    Sig: Take 1 tablet (50 mg total) by mouth daily.    Dispense:  90 tablet    Refill:  1    Order Specific Question:  Supervising Provider    Answer:  Chipper Herb [[id number]]  . pantoprazole (PROTONIX) 40 MG tablet    Sig: Take 1 tablet (40 mg total) by mouth 2 (two) times daily.    Dispense:  180 tablet    Refill:  1    Order Specific Question:  Supervising Provider    Answer:  Chipper Herb [[id number]]  . pioglitazone (ACTOS) 30 MG tablet    Sig: Take 1 tablet (30 mg total) by mouth daily.    Dispense:  90 tablet    Refill:  1    Order Specific Question:  Supervising Provider    Answer:  Joycelyn Man   Appointment with  clinical pharmacist Labs pending Health maintenance reviewed Diet and exercise encouraged- strict low carb diet Continue all meds Follow up  In 3 months   [place], FNP

## 2013-07-20 NOTE — Patient Instructions (Signed)

## 2013-07-22 LAB — CMP14+EGFR
ALBUMIN: 4.3 g/dL (ref 3.6–4.8)
ALK PHOS: 117 IU/L (ref 39–117)
ALT: 6 IU/L (ref 0–32)
AST: 14 IU/L (ref 0–40)
Albumin/Globulin Ratio: 1.7 (ref 1.1–2.5)
BUN/Creatinine Ratio: 21 (ref 11–26)
BUN: 38 mg/dL — AB (ref 8–27)
CALCIUM: 9.6 mg/dL (ref 8.7–10.3)
CHLORIDE: 102 mmol/L (ref 97–108)
CO2: 16 mmol/L — ABNORMAL LOW (ref 18–29)
CREATININE: 1.81 mg/dL — AB (ref 0.57–1.00)
GFR calc Af Amer: 33 mL/min/{1.73_m2} — ABNORMAL LOW (ref >59)
GFR calc non Af Amer: 28 mL/min/{1.73_m2} — ABNORMAL LOW (ref >59)
GLOBULIN, TOTAL: 2.6 g/dL (ref 1.5–4.5)
GLUCOSE: 296 mg/dL — AB (ref 65–99)
Potassium: 5.2 mmol/L (ref 3.5–5.2)
Sodium: 140 mmol/L (ref 134–144)
Total Bilirubin: 0.6 mg/dL (ref 0.0–1.2)
Total Protein: 6.9 g/dL (ref 6.0–8.5)

## 2013-07-22 LAB — NMR, LIPOPROFILE
CHOLESTEROL: 125 mg/dL (ref <200)
HDL Cholesterol by NMR: 38 mg/dL — ABNORMAL LOW (ref >=40)
HDL PARTICLE NUMBER: 28.4 umol/L — AB (ref >=30.5)
LDL PARTICLE NUMBER: 854 nmol/L (ref <1000)
LDL Size: 20.8 nm (ref >20.5)
LDLC SERPL CALC-MCNC: 51 mg/dL (ref <100)
LP-IR SCORE: 42 (ref <=45)
Small LDL Particle Number: 451 nmol/L (ref <=527)
Triglycerides by NMR: 178 mg/dL — ABNORMAL HIGH (ref <150)

## 2013-07-23 ENCOUNTER — Encounter (HOSPITAL_COMMUNITY): Payer: Medicare Other

## 2013-07-23 ENCOUNTER — Telehealth: Payer: Self-pay | Admitting: Nurse Practitioner

## 2013-07-24 ENCOUNTER — Encounter (HOSPITAL_COMMUNITY)
Admission: RE | Admit: 2013-07-24 | Discharge: 2013-07-24 | Disposition: A | Discharge status: Home / Self Care | Payer: Medicare Other | Source: Ambulatory Visit | Attending: Nephrology | Admitting: Nephrology

## 2013-07-24 ENCOUNTER — Encounter (HOSPITAL_BASED_OUTPATIENT_CLINIC_OR_DEPARTMENT_OTHER): Payer: Medicare Other | Attending: General Surgery

## 2013-07-24 DIAGNOSIS — E119 Type 2 diabetes mellitus without complications: Secondary | ICD-10-CM | POA: Insufficient documentation

## 2013-07-24 DIAGNOSIS — Y838 Other surgical procedures as the cause of abnormal reaction of the patient, or of later complication, without mention of misadventure at the time of the procedure: Secondary | ICD-10-CM | POA: Insufficient documentation

## 2013-07-24 DIAGNOSIS — M069 Rheumatoid arthritis, unspecified: Secondary | ICD-10-CM | POA: Diagnosis not present

## 2013-07-24 DIAGNOSIS — D638 Anemia in other chronic diseases classified elsewhere: Secondary | ICD-10-CM | POA: Diagnosis not present

## 2013-07-24 DIAGNOSIS — T8189XA Other complications of procedures, not elsewhere classified, initial encounter: Secondary | ICD-10-CM | POA: Insufficient documentation

## 2013-07-24 DIAGNOSIS — I10 Essential (primary) hypertension: Secondary | ICD-10-CM | POA: Diagnosis not present

## 2013-07-24 DIAGNOSIS — N183 Chronic kidney disease, stage 3 unspecified: Secondary | ICD-10-CM | POA: Diagnosis not present

## 2013-07-24 LAB — POCT HEMOGLOBIN-HEMACUE: Hemoglobin: 11.8 g/dL — ABNORMAL LOW (ref 12.0–15.0)

## 2013-07-24 LAB — IRON AND TIBC
IRON: 116 ug/dL (ref 42–135)
SATURATION RATIOS: 43 % (ref 20–55)
TIBC: 271 ug/dL (ref 250–470)
UIBC: 155 ug/dL (ref 125–400)

## 2013-07-24 LAB — FERRITIN: Ferritin: 586 ng/mL — ABNORMAL HIGH (ref 10–291)

## 2013-07-24 MED ORDER — DARBEPOETIN ALFA-POLYSORBATE 100 MCG/0.5ML IJ SOLN
INTRAMUSCULAR | Status: AC
  Administered 2013-07-24: 100 ug via SUBCUTANEOUS
  Filled 2013-07-24: qty 0.5

## 2013-07-24 MED ORDER — DARBEPOETIN ALFA-POLYSORBATE 100 MCG/0.5ML IJ SOLN
100.0000 ug | INTRAMUSCULAR | Status: DC
  Administered 2013-07-24: 100 ug via SUBCUTANEOUS

## 2013-07-25 DIAGNOSIS — E119 Type 2 diabetes mellitus without complications: Secondary | ICD-10-CM | POA: Diagnosis not present

## 2013-07-25 DIAGNOSIS — L89109 Pressure ulcer of unspecified part of back, unspecified stage: Secondary | ICD-10-CM | POA: Diagnosis not present

## 2013-07-25 DIAGNOSIS — N183 Chronic kidney disease, stage 3 unspecified: Secondary | ICD-10-CM | POA: Diagnosis not present

## 2013-07-25 DIAGNOSIS — I509 Heart failure, unspecified: Secondary | ICD-10-CM | POA: Diagnosis not present

## 2013-07-25 DIAGNOSIS — L8993 Pressure ulcer of unspecified site, stage 3: Secondary | ICD-10-CM | POA: Diagnosis not present

## 2013-07-25 DIAGNOSIS — I129 Hypertensive chronic kidney disease with stage 1 through stage 4 chronic kidney disease, or unspecified chronic kidney disease: Secondary | ICD-10-CM | POA: Diagnosis not present

## 2013-07-27 DIAGNOSIS — I509 Heart failure, unspecified: Secondary | ICD-10-CM | POA: Diagnosis not present

## 2013-07-27 DIAGNOSIS — N183 Chronic kidney disease, stage 3 unspecified: Secondary | ICD-10-CM | POA: Diagnosis not present

## 2013-07-27 DIAGNOSIS — I129 Hypertensive chronic kidney disease with stage 1 through stage 4 chronic kidney disease, or unspecified chronic kidney disease: Secondary | ICD-10-CM | POA: Diagnosis not present

## 2013-07-27 DIAGNOSIS — E119 Type 2 diabetes mellitus without complications: Secondary | ICD-10-CM | POA: Diagnosis not present

## 2013-07-27 DIAGNOSIS — L8993 Pressure ulcer of unspecified site, stage 3: Secondary | ICD-10-CM | POA: Diagnosis not present

## 2013-07-27 DIAGNOSIS — L89109 Pressure ulcer of unspecified part of back, unspecified stage: Secondary | ICD-10-CM | POA: Diagnosis not present

## 2013-07-30 DIAGNOSIS — E119 Type 2 diabetes mellitus without complications: Secondary | ICD-10-CM | POA: Diagnosis not present

## 2013-07-30 DIAGNOSIS — L89109 Pressure ulcer of unspecified part of back, unspecified stage: Secondary | ICD-10-CM | POA: Diagnosis not present

## 2013-07-30 DIAGNOSIS — I509 Heart failure, unspecified: Secondary | ICD-10-CM | POA: Diagnosis not present

## 2013-07-30 DIAGNOSIS — N183 Chronic kidney disease, stage 3 unspecified: Secondary | ICD-10-CM | POA: Diagnosis not present

## 2013-07-30 DIAGNOSIS — I129 Hypertensive chronic kidney disease with stage 1 through stage 4 chronic kidney disease, or unspecified chronic kidney disease: Secondary | ICD-10-CM | POA: Diagnosis not present

## 2013-07-30 DIAGNOSIS — L8993 Pressure ulcer of unspecified site, stage 3: Secondary | ICD-10-CM | POA: Diagnosis not present

## 2013-08-01 DIAGNOSIS — E119 Type 2 diabetes mellitus without complications: Secondary | ICD-10-CM | POA: Diagnosis not present

## 2013-08-01 DIAGNOSIS — I509 Heart failure, unspecified: Secondary | ICD-10-CM | POA: Diagnosis not present

## 2013-08-01 DIAGNOSIS — I129 Hypertensive chronic kidney disease with stage 1 through stage 4 chronic kidney disease, or unspecified chronic kidney disease: Secondary | ICD-10-CM | POA: Diagnosis not present

## 2013-08-01 DIAGNOSIS — L89109 Pressure ulcer of unspecified part of back, unspecified stage: Secondary | ICD-10-CM | POA: Diagnosis not present

## 2013-08-01 DIAGNOSIS — L8993 Pressure ulcer of unspecified site, stage 3: Secondary | ICD-10-CM | POA: Diagnosis not present

## 2013-08-01 DIAGNOSIS — N183 Chronic kidney disease, stage 3 unspecified: Secondary | ICD-10-CM | POA: Diagnosis not present

## 2013-08-03 DIAGNOSIS — E119 Type 2 diabetes mellitus without complications: Secondary | ICD-10-CM | POA: Diagnosis not present

## 2013-08-03 DIAGNOSIS — I509 Heart failure, unspecified: Secondary | ICD-10-CM | POA: Diagnosis not present

## 2013-08-03 DIAGNOSIS — N183 Chronic kidney disease, stage 3 unspecified: Secondary | ICD-10-CM | POA: Diagnosis not present

## 2013-08-03 DIAGNOSIS — I129 Hypertensive chronic kidney disease with stage 1 through stage 4 chronic kidney disease, or unspecified chronic kidney disease: Secondary | ICD-10-CM | POA: Diagnosis not present

## 2013-08-03 DIAGNOSIS — L89109 Pressure ulcer of unspecified part of back, unspecified stage: Secondary | ICD-10-CM | POA: Diagnosis not present

## 2013-08-03 DIAGNOSIS — L8993 Pressure ulcer of unspecified site, stage 3: Secondary | ICD-10-CM | POA: Diagnosis not present

## 2013-08-05 DIAGNOSIS — N183 Chronic kidney disease, stage 3 unspecified: Secondary | ICD-10-CM | POA: Diagnosis not present

## 2013-08-05 DIAGNOSIS — L89109 Pressure ulcer of unspecified part of back, unspecified stage: Secondary | ICD-10-CM | POA: Diagnosis not present

## 2013-08-05 DIAGNOSIS — I509 Heart failure, unspecified: Secondary | ICD-10-CM | POA: Diagnosis not present

## 2013-08-05 DIAGNOSIS — I129 Hypertensive chronic kidney disease with stage 1 through stage 4 chronic kidney disease, or unspecified chronic kidney disease: Secondary | ICD-10-CM | POA: Diagnosis not present

## 2013-08-05 DIAGNOSIS — E119 Type 2 diabetes mellitus without complications: Secondary | ICD-10-CM | POA: Diagnosis not present

## 2013-08-05 DIAGNOSIS — L8993 Pressure ulcer of unspecified site, stage 3: Secondary | ICD-10-CM | POA: Diagnosis not present

## 2013-08-06 DIAGNOSIS — N183 Chronic kidney disease, stage 3 unspecified: Secondary | ICD-10-CM | POA: Diagnosis not present

## 2013-08-06 DIAGNOSIS — I509 Heart failure, unspecified: Secondary | ICD-10-CM | POA: Diagnosis not present

## 2013-08-06 DIAGNOSIS — I129 Hypertensive chronic kidney disease with stage 1 through stage 4 chronic kidney disease, or unspecified chronic kidney disease: Secondary | ICD-10-CM | POA: Diagnosis not present

## 2013-08-06 DIAGNOSIS — L89109 Pressure ulcer of unspecified part of back, unspecified stage: Secondary | ICD-10-CM | POA: Diagnosis not present

## 2013-08-06 DIAGNOSIS — E119 Type 2 diabetes mellitus without complications: Secondary | ICD-10-CM | POA: Diagnosis not present

## 2013-08-06 DIAGNOSIS — L8993 Pressure ulcer of unspecified site, stage 3: Secondary | ICD-10-CM | POA: Diagnosis not present

## 2013-08-08 DIAGNOSIS — E119 Type 2 diabetes mellitus without complications: Secondary | ICD-10-CM | POA: Diagnosis not present

## 2013-08-08 DIAGNOSIS — L89109 Pressure ulcer of unspecified part of back, unspecified stage: Secondary | ICD-10-CM | POA: Diagnosis not present

## 2013-08-08 DIAGNOSIS — N183 Chronic kidney disease, stage 3 unspecified: Secondary | ICD-10-CM | POA: Diagnosis not present

## 2013-08-08 DIAGNOSIS — L8993 Pressure ulcer of unspecified site, stage 3: Secondary | ICD-10-CM | POA: Diagnosis not present

## 2013-08-08 DIAGNOSIS — I129 Hypertensive chronic kidney disease with stage 1 through stage 4 chronic kidney disease, or unspecified chronic kidney disease: Secondary | ICD-10-CM | POA: Diagnosis not present

## 2013-08-08 DIAGNOSIS — I509 Heart failure, unspecified: Secondary | ICD-10-CM | POA: Diagnosis not present

## 2013-08-09 ENCOUNTER — Ambulatory Visit: Payer: Self-pay

## 2013-08-09 ENCOUNTER — Encounter: Payer: Self-pay | Admitting: Pharmacist

## 2013-08-09 ENCOUNTER — Ambulatory Visit (INDEPENDENT_AMBULATORY_CARE_PROVIDER_SITE_OTHER): Payer: Medicare Other | Admitting: Pharmacist

## 2013-08-09 VITALS — BP 148/84 | HR 68 | Ht 62.0 in | Wt 183.0 lb

## 2013-08-09 DIAGNOSIS — E119 Type 2 diabetes mellitus without complications: Secondary | ICD-10-CM | POA: Diagnosis not present

## 2013-08-09 DIAGNOSIS — N259 Disorder resulting from impaired renal tubular function, unspecified: Secondary | ICD-10-CM | POA: Diagnosis not present

## 2013-08-09 DIAGNOSIS — E785 Hyperlipidemia, unspecified: Secondary | ICD-10-CM | POA: Diagnosis not present

## 2013-08-09 DIAGNOSIS — I10 Essential (primary) hypertension: Secondary | ICD-10-CM

## 2013-08-09 MED ORDER — SAXAGLIPTIN HCL 2.5 MG PO TABS: 2.5000 mg | ORAL_TABLET | Freq: Every day | ORAL | Status: DC

## 2013-08-09 MED ORDER — GLUCOSE BLOOD VI DISK: DISK | Status: DC

## 2013-08-09 NOTE — Patient Instructions (Signed)

## 2013-08-09 NOTE — Progress Notes (Signed)
Diabetes Follow-Up Visit Chief Complaint:   Chief Complaint  Patient presents with  . Diabetes     Filed Vitals:   08/09/13 0837  BP: 148/84  Pulse: 68    HPI: patient with type 2 DM, uncontrolled.  Last A1C was 8.6%.    Current Diabetes Medications:  glimepiride (Amaryl) and pioglitazone 30mg    Home BG Monitoring:  Checking 1 times a day. (only checking in the morning) Average:  150  High: 200  Low:  127  Low fat/carbohydrate diet?  No - but is improving Nicotine Abuse?  No Medication Compliance?  Yes Exercise?  No Alcohol Abuse?  No   Exam Edema: negative  Polyuria:  negatvie  Polydipsia:  negative Polyphagia:  negative  BMI:  Body mass index is 33.46 kg/(m^2).   Weight changes:  stable General Appearance:  alert, oriented, no acute distress and obese Mood/Affect:  normal   Lab Results  Component Value Date   HGBA1C 8.6 07/20/2013    No results found for this basenameDerl Barrow    Lab Results  Component Value Date   CHOL 125 07/20/2013   LDLCALC 48 09/11/2012   TRIG 79 09/11/2012      Assessment: 1.  Diabetes.  uncotnrolled 2.  Blood Pressure.  Slightly elevated todya 3.  Lipids.  Tg elevated at last check 4.  Renal insufficiency  Recommendations: 1.  Medication recommendations at this time are as follows:   Add onglyza 2.5mg  1 tablet daily (I would like to try a new combo medication in the future that is not avaialble yet but should be in the next month).  Continue glimepiride 4mg  bid and pioglitazone 30mg  qd 2.  Reviewed HBG goals:  Fasting 80-130 and 1-2 hour post prandial <180.  Patient is instructed to check BG 2 times per day.   New rx for [place] test stipes faxed to [employer] [facility] [employer] [phone]) 3.  BP goal < 140/85. 4.  LDL goal of < 100, HDL > 40 and TG < 150. 5.  Reviewed s/s of hypoglycemia - patient to call for medication adjustment if get BG less than 70 6.  Dietary recommendations:  Discussed limiting high CHO  containing foods - should help with BG and Tg. 7.  Physical Activity recommendations:  Move more - start with as little as 5 minutes daily 8.  Return to clinic in 4-6 wks   Time spent counseling patient:  30 minutes    Cherre Robins, PharmD, CPP

## 2013-08-10 DIAGNOSIS — I509 Heart failure, unspecified: Secondary | ICD-10-CM | POA: Diagnosis not present

## 2013-08-10 DIAGNOSIS — L8993 Pressure ulcer of unspecified site, stage 3: Secondary | ICD-10-CM | POA: Diagnosis not present

## 2013-08-10 DIAGNOSIS — I129 Hypertensive chronic kidney disease with stage 1 through stage 4 chronic kidney disease, or unspecified chronic kidney disease: Secondary | ICD-10-CM | POA: Diagnosis not present

## 2013-08-10 DIAGNOSIS — L89109 Pressure ulcer of unspecified part of back, unspecified stage: Secondary | ICD-10-CM | POA: Diagnosis not present

## 2013-08-10 DIAGNOSIS — E119 Type 2 diabetes mellitus without complications: Secondary | ICD-10-CM | POA: Diagnosis not present

## 2013-08-10 DIAGNOSIS — N183 Chronic kidney disease, stage 3 unspecified: Secondary | ICD-10-CM | POA: Diagnosis not present

## 2013-08-13 DIAGNOSIS — N183 Chronic kidney disease, stage 3 unspecified: Secondary | ICD-10-CM | POA: Diagnosis not present

## 2013-08-13 DIAGNOSIS — L89109 Pressure ulcer of unspecified part of back, unspecified stage: Secondary | ICD-10-CM | POA: Diagnosis not present

## 2013-08-13 DIAGNOSIS — I129 Hypertensive chronic kidney disease with stage 1 through stage 4 chronic kidney disease, or unspecified chronic kidney disease: Secondary | ICD-10-CM | POA: Diagnosis not present

## 2013-08-13 DIAGNOSIS — L8993 Pressure ulcer of unspecified site, stage 3: Secondary | ICD-10-CM | POA: Diagnosis not present

## 2013-08-13 DIAGNOSIS — E119 Type 2 diabetes mellitus without complications: Secondary | ICD-10-CM | POA: Diagnosis not present

## 2013-08-13 DIAGNOSIS — I509 Heart failure, unspecified: Secondary | ICD-10-CM | POA: Diagnosis not present

## 2013-08-15 DIAGNOSIS — N183 Chronic kidney disease, stage 3 unspecified: Secondary | ICD-10-CM | POA: Diagnosis not present

## 2013-08-15 DIAGNOSIS — I129 Hypertensive chronic kidney disease with stage 1 through stage 4 chronic kidney disease, or unspecified chronic kidney disease: Secondary | ICD-10-CM | POA: Diagnosis not present

## 2013-08-15 DIAGNOSIS — L8993 Pressure ulcer of unspecified site, stage 3: Secondary | ICD-10-CM | POA: Diagnosis not present

## 2013-08-15 DIAGNOSIS — I509 Heart failure, unspecified: Secondary | ICD-10-CM | POA: Diagnosis not present

## 2013-08-15 DIAGNOSIS — L89109 Pressure ulcer of unspecified part of back, unspecified stage: Secondary | ICD-10-CM | POA: Diagnosis not present

## 2013-08-15 DIAGNOSIS — E119 Type 2 diabetes mellitus without complications: Secondary | ICD-10-CM | POA: Diagnosis not present

## 2013-08-17 DIAGNOSIS — L89109 Pressure ulcer of unspecified part of back, unspecified stage: Secondary | ICD-10-CM | POA: Diagnosis not present

## 2013-08-17 DIAGNOSIS — L8993 Pressure ulcer of unspecified site, stage 3: Secondary | ICD-10-CM | POA: Diagnosis not present

## 2013-08-17 DIAGNOSIS — N183 Chronic kidney disease, stage 3 unspecified: Secondary | ICD-10-CM | POA: Diagnosis not present

## 2013-08-17 DIAGNOSIS — I129 Hypertensive chronic kidney disease with stage 1 through stage 4 chronic kidney disease, or unspecified chronic kidney disease: Secondary | ICD-10-CM | POA: Diagnosis not present

## 2013-08-17 DIAGNOSIS — I509 Heart failure, unspecified: Secondary | ICD-10-CM | POA: Diagnosis not present

## 2013-08-17 DIAGNOSIS — E119 Type 2 diabetes mellitus without complications: Secondary | ICD-10-CM | POA: Diagnosis not present

## 2013-08-21 ENCOUNTER — Encounter (HOSPITAL_COMMUNITY)
Admission: RE | Admit: 2013-08-21 | Discharge: 2013-08-21 | Disposition: A | Discharge status: Home / Self Care | Payer: Medicare Other | Source: Ambulatory Visit | Attending: Nephrology | Admitting: Nephrology

## 2013-08-21 DIAGNOSIS — N183 Chronic kidney disease, stage 3 unspecified: Secondary | ICD-10-CM | POA: Diagnosis not present

## 2013-08-21 DIAGNOSIS — L89109 Pressure ulcer of unspecified part of back, unspecified stage: Secondary | ICD-10-CM | POA: Diagnosis not present

## 2013-08-21 DIAGNOSIS — L8993 Pressure ulcer of unspecified site, stage 3: Secondary | ICD-10-CM | POA: Diagnosis not present

## 2013-08-21 DIAGNOSIS — T8189XA Other complications of procedures, not elsewhere classified, initial encounter: Secondary | ICD-10-CM | POA: Diagnosis not present

## 2013-08-21 DIAGNOSIS — I129 Hypertensive chronic kidney disease with stage 1 through stage 4 chronic kidney disease, or unspecified chronic kidney disease: Secondary | ICD-10-CM | POA: Diagnosis not present

## 2013-08-21 DIAGNOSIS — M069 Rheumatoid arthritis, unspecified: Secondary | ICD-10-CM | POA: Diagnosis not present

## 2013-08-21 DIAGNOSIS — I10 Essential (primary) hypertension: Secondary | ICD-10-CM | POA: Diagnosis not present

## 2013-08-21 DIAGNOSIS — E119 Type 2 diabetes mellitus without complications: Secondary | ICD-10-CM | POA: Diagnosis not present

## 2013-08-21 DIAGNOSIS — I509 Heart failure, unspecified: Secondary | ICD-10-CM

## 2013-08-21 LAB — POCT HEMOGLOBIN-HEMACUE: Hemoglobin: 13.5 g/dL (ref 12.0–15.0)

## 2013-08-21 MED ORDER — DARBEPOETIN ALFA-POLYSORBATE 100 MCG/0.5ML IJ SOLN: 100.0000 ug | INTRAMUSCULAR | Status: DC

## 2013-08-22 DIAGNOSIS — I129 Hypertensive chronic kidney disease with stage 1 through stage 4 chronic kidney disease, or unspecified chronic kidney disease: Secondary | ICD-10-CM | POA: Diagnosis not present

## 2013-08-22 DIAGNOSIS — I509 Heart failure, unspecified: Secondary | ICD-10-CM | POA: Diagnosis not present

## 2013-08-22 DIAGNOSIS — E119 Type 2 diabetes mellitus without complications: Secondary | ICD-10-CM | POA: Diagnosis not present

## 2013-08-22 DIAGNOSIS — L8993 Pressure ulcer of unspecified site, stage 3: Secondary | ICD-10-CM | POA: Diagnosis not present

## 2013-08-22 DIAGNOSIS — N183 Chronic kidney disease, stage 3 unspecified: Secondary | ICD-10-CM | POA: Diagnosis not present

## 2013-08-22 DIAGNOSIS — L89109 Pressure ulcer of unspecified part of back, unspecified stage: Secondary | ICD-10-CM | POA: Diagnosis not present

## 2013-08-23 DIAGNOSIS — D235 Other benign neoplasm of skin of trunk: Secondary | ICD-10-CM | POA: Diagnosis not present

## 2013-08-23 DIAGNOSIS — D485 Neoplasm of uncertain behavior of skin: Secondary | ICD-10-CM | POA: Diagnosis not present

## 2013-08-23 DIAGNOSIS — L570 Actinic keratosis: Secondary | ICD-10-CM | POA: Diagnosis not present

## 2013-08-25 DIAGNOSIS — L89109 Pressure ulcer of unspecified part of back, unspecified stage: Secondary | ICD-10-CM | POA: Diagnosis not present

## 2013-08-25 DIAGNOSIS — I129 Hypertensive chronic kidney disease with stage 1 through stage 4 chronic kidney disease, or unspecified chronic kidney disease: Secondary | ICD-10-CM | POA: Diagnosis not present

## 2013-08-25 DIAGNOSIS — N183 Chronic kidney disease, stage 3 unspecified: Secondary | ICD-10-CM | POA: Diagnosis not present

## 2013-08-25 DIAGNOSIS — E119 Type 2 diabetes mellitus without complications: Secondary | ICD-10-CM | POA: Diagnosis not present

## 2013-08-25 DIAGNOSIS — I509 Heart failure, unspecified: Secondary | ICD-10-CM | POA: Diagnosis not present

## 2013-08-25 DIAGNOSIS — L8993 Pressure ulcer of unspecified site, stage 3: Secondary | ICD-10-CM | POA: Diagnosis not present

## 2013-08-27 DIAGNOSIS — I129 Hypertensive chronic kidney disease with stage 1 through stage 4 chronic kidney disease, or unspecified chronic kidney disease: Secondary | ICD-10-CM | POA: Diagnosis not present

## 2013-08-27 DIAGNOSIS — L89109 Pressure ulcer of unspecified part of back, unspecified stage: Secondary | ICD-10-CM | POA: Diagnosis not present

## 2013-08-27 DIAGNOSIS — L8993 Pressure ulcer of unspecified site, stage 3: Secondary | ICD-10-CM | POA: Diagnosis not present

## 2013-08-27 DIAGNOSIS — I509 Heart failure, unspecified: Secondary | ICD-10-CM | POA: Diagnosis not present

## 2013-08-27 DIAGNOSIS — N183 Chronic kidney disease, stage 3 unspecified: Secondary | ICD-10-CM | POA: Diagnosis not present

## 2013-08-27 DIAGNOSIS — E119 Type 2 diabetes mellitus without complications: Secondary | ICD-10-CM | POA: Diagnosis not present

## 2013-08-29 DIAGNOSIS — N183 Chronic kidney disease, stage 3 unspecified: Secondary | ICD-10-CM | POA: Diagnosis not present

## 2013-08-29 DIAGNOSIS — L89109 Pressure ulcer of unspecified part of back, unspecified stage: Secondary | ICD-10-CM | POA: Diagnosis not present

## 2013-08-29 DIAGNOSIS — I509 Heart failure, unspecified: Secondary | ICD-10-CM | POA: Diagnosis not present

## 2013-08-29 DIAGNOSIS — L8993 Pressure ulcer of unspecified site, stage 3: Secondary | ICD-10-CM | POA: Diagnosis not present

## 2013-08-29 DIAGNOSIS — I129 Hypertensive chronic kidney disease with stage 1 through stage 4 chronic kidney disease, or unspecified chronic kidney disease: Secondary | ICD-10-CM | POA: Diagnosis not present

## 2013-08-29 DIAGNOSIS — E119 Type 2 diabetes mellitus without complications: Secondary | ICD-10-CM | POA: Diagnosis not present

## 2013-09-01 DIAGNOSIS — I129 Hypertensive chronic kidney disease with stage 1 through stage 4 chronic kidney disease, or unspecified chronic kidney disease: Secondary | ICD-10-CM | POA: Diagnosis not present

## 2013-09-01 DIAGNOSIS — L8993 Pressure ulcer of unspecified site, stage 3: Secondary | ICD-10-CM | POA: Diagnosis not present

## 2013-09-01 DIAGNOSIS — I509 Heart failure, unspecified: Secondary | ICD-10-CM | POA: Diagnosis not present

## 2013-09-01 DIAGNOSIS — L89109 Pressure ulcer of unspecified part of back, unspecified stage: Secondary | ICD-10-CM | POA: Diagnosis not present

## 2013-09-01 DIAGNOSIS — N183 Chronic kidney disease, stage 3 unspecified: Secondary | ICD-10-CM | POA: Diagnosis not present

## 2013-09-01 DIAGNOSIS — E119 Type 2 diabetes mellitus without complications: Secondary | ICD-10-CM | POA: Diagnosis not present

## 2013-09-03 ENCOUNTER — Encounter: Payer: Self-pay | Admitting: *Deleted

## 2013-09-03 DIAGNOSIS — I509 Heart failure, unspecified: Secondary | ICD-10-CM | POA: Diagnosis not present

## 2013-09-03 DIAGNOSIS — L8993 Pressure ulcer of unspecified site, stage 3: Secondary | ICD-10-CM | POA: Diagnosis not present

## 2013-09-03 DIAGNOSIS — I129 Hypertensive chronic kidney disease with stage 1 through stage 4 chronic kidney disease, or unspecified chronic kidney disease: Secondary | ICD-10-CM | POA: Diagnosis not present

## 2013-09-03 DIAGNOSIS — N183 Chronic kidney disease, stage 3 unspecified: Secondary | ICD-10-CM | POA: Diagnosis not present

## 2013-09-03 DIAGNOSIS — L89109 Pressure ulcer of unspecified part of back, unspecified stage: Secondary | ICD-10-CM | POA: Diagnosis not present

## 2013-09-03 DIAGNOSIS — E119 Type 2 diabetes mellitus without complications: Secondary | ICD-10-CM | POA: Diagnosis not present

## 2013-09-04 ENCOUNTER — Encounter (HOSPITAL_COMMUNITY): Payer: Medicare Other

## 2013-09-05 DIAGNOSIS — I129 Hypertensive chronic kidney disease with stage 1 through stage 4 chronic kidney disease, or unspecified chronic kidney disease: Secondary | ICD-10-CM | POA: Diagnosis not present

## 2013-09-05 DIAGNOSIS — L8993 Pressure ulcer of unspecified site, stage 3: Secondary | ICD-10-CM | POA: Diagnosis not present

## 2013-09-05 DIAGNOSIS — L89109 Pressure ulcer of unspecified part of back, unspecified stage: Secondary | ICD-10-CM | POA: Diagnosis not present

## 2013-09-05 DIAGNOSIS — E119 Type 2 diabetes mellitus without complications: Secondary | ICD-10-CM | POA: Diagnosis not present

## 2013-09-05 DIAGNOSIS — I509 Heart failure, unspecified: Secondary | ICD-10-CM | POA: Diagnosis not present

## 2013-09-05 DIAGNOSIS — N183 Chronic kidney disease, stage 3 unspecified: Secondary | ICD-10-CM | POA: Diagnosis not present

## 2013-09-06 ENCOUNTER — Ambulatory Visit (INDEPENDENT_AMBULATORY_CARE_PROVIDER_SITE_OTHER): Payer: Medicare Other | Admitting: Pharmacist

## 2013-09-06 ENCOUNTER — Encounter: Payer: Self-pay | Admitting: Pharmacist

## 2013-09-06 VITALS — BP 130/74 | HR 72 | Ht 62.5 in | Wt 181.5 lb

## 2013-09-06 DIAGNOSIS — Z0000 Encounter for general adult medical examination without abnormal findings: Secondary | ICD-10-CM | POA: Diagnosis not present

## 2013-09-06 DIAGNOSIS — M810 Age-related osteoporosis without current pathological fracture: Secondary | ICD-10-CM

## 2013-09-06 DIAGNOSIS — M8580 Other specified disorders of bone density and structure, unspecified site: Secondary | ICD-10-CM

## 2013-09-06 NOTE — Progress Notes (Signed)
Subjective:    [name] is a 68 y.o. female who presents for Medicare Initial preventive examination and to follow up type 2 diabetes after starting Onglyza about 1 month ago.  Preventive Screening-Counseling & Management  Tobacco History  Smoking status  . Never Smoker   Smokeless tobacco  . Never Used     Current Problems (verified) Patient Active Problem List   Diagnosis Date Noted  . Type II or unspecified type diabetes mellitus without mention of complication, uncontrolled 07/20/2013  . Decubitus ulcer of sacral region, stage 3 01/19/2013  . UTI (urinary tract infection) 10/08/2012  . Fracture of femur, distal, left, closed 10/06/2012  . Fall at home 10/06/2012  . Edema 03/03/2011  . Abnormal labor 03/03/2011  . Abnormal laboratory test 02/10/2011  . Obesity 02/10/2011  . DM 10/28/2007  . DIABETIC  RETINOPATHY 10/28/2007  . HYPERLIPIDEMIA 10/28/2007  . ANXIETY 10/28/2007  . DEPRESSION 10/28/2007  . HYPERTENSION 10/28/2007  . UPPER GASTROINTESTINAL HEMORRHAGE, ACUTE 10/28/2007  . RENAL INSUFFICIENCY 10/28/2007  . ESOPHAGITIS, HX OF 10/28/2007  . COLONIC POLYPS, HYPERPLASTIC 11/17/2006  . DIVERTICULOSIS, COLON 11/17/2006    Medications Prior to Visit Current Outpatient Prescriptions on File Prior to Visit  Medication Sig Dispense Refill  . atorvastatin (LIPITOR) 40 MG tablet Take 1 tablet (40 mg total) by mouth daily.  90 tablet  1  . calcitRIOL (ROCALTROL) 0.25 MCG capsule Take 1 capsule (0.25 mcg total) by mouth daily.  90 capsule  1  . cloNIDine (CATAPRES) 0.2 MG tablet Take 1 tablet (0.2 mg total) by mouth 2 (two) times daily.  90 tablet  1  . furosemide (LASIX) 40 MG tablet Take 1 tablet (40 mg total) by mouth daily.  90 tablet  1  . glimepiride (AMARYL) 4 MG tablet Take 1 tablet (4 mg total) by mouth 2 (two) times daily.  180 tablet  1  . Glucose Blood DISK Use to check BG up to twice a day.  Dx:  250.02  200 each  2  . levothyroxine (SYNTHROID,  LEVOTHROID) 75 MCG tablet Take 1 tablet (75 mcg total) by mouth daily before breakfast.  90 tablet  1  . losartan (COZAAR) 100 MG tablet Take 1 tablet (100 mg total) by mouth daily.  90 tablet  1  . metoCLOPramide (REGLAN) 5 MG tablet Take 1 tablet (5 mg total) by mouth 4 (four) times daily.  360 tablet  1  . metoprolol succinate (TOPROL-XL) 50 MG 24 hr tablet Take 1 tablet (50 mg total) by mouth daily.  90 tablet  1  . pantoprazole (PROTONIX) 40 MG tablet Take 1 tablet (40 mg total) by mouth 2 (two) times daily.  180 tablet  1  . pioglitazone (ACTOS) 30 MG tablet Take 1 tablet (30 mg total) by mouth daily.  90 tablet  1  . saxagliptin HCl (ONGLYZA) 2.5 MG TABS tablet Take 1 tablet (2.5 mg total) by mouth daily.  28 tablet  0  . sodium polystyrene (SPS) 15 GM/60ML suspension Take 60 mLs (15 g total) by mouth once.  500 mL  1  . Soft Lens Products (SALINE) SOLN 1 application by Does not apply route daily.  1 Bottle  0   No current facility-administered medications on file prior to visit.    Current Medications (verified) Current Outpatient Prescriptions  Medication Sig Dispense Refill  . atorvastatin (LIPITOR) 40 MG tablet Take 1 tablet (40 mg total) by mouth daily.  90 tablet  1  . calcitRIOL (  ROCALTROL) 0.25 MCG capsule Take 1 capsule (0.25 mcg total) by mouth daily.  90 capsule  1  . cloNIDine (CATAPRES) 0.2 MG tablet Take 1 tablet (0.2 mg total) by mouth 2 (two) times daily.  90 tablet  1  . darbepoetin (ARANESP) 100 MCG/0.5ML SOLN injection Inject 100 mcg into the skin every 5 (five) weeks.      . furosemide (LASIX) 40 MG tablet Take 1 tablet (40 mg total) by mouth daily.  90 tablet  1  . glimepiride (AMARYL) 4 MG tablet Take 1 tablet (4 mg total) by mouth 2 (two) times daily.  180 tablet  1  . Glucose Blood DISK Use to check BG up to twice a day.  Dx:  250.02  200 each  2  . levothyroxine (SYNTHROID, LEVOTHROID) 75 MCG tablet Take 1 tablet (75 mcg total) by mouth daily before breakfast.   90 tablet  1  . losartan (COZAAR) 100 MG tablet Take 1 tablet (100 mg total) by mouth daily.  90 tablet  1  . metoCLOPramide (REGLAN) 5 MG tablet Take 1 tablet (5 mg total) by mouth 4 (four) times daily.  360 tablet  1  . metoprolol succinate (TOPROL-XL) 50 MG 24 hr tablet Take 1 tablet (50 mg total) by mouth daily.  90 tablet  1  . pantoprazole (PROTONIX) 40 MG tablet Take 1 tablet (40 mg total) by mouth 2 (two) times daily.  180 tablet  1  . pioglitazone (ACTOS) 30 MG tablet Take 1 tablet (30 mg total) by mouth daily.  90 tablet  1  . saxagliptin HCl (ONGLYZA) 2.5 MG TABS tablet Take 1 tablet (2.5 mg total) by mouth daily.  28 tablet  0  . sodium polystyrene (SPS) 15 GM/60ML suspension Take 60 mLs (15 g total) by mouth once.  500 mL  1  . Soft Lens Products (SALINE) SOLN 1 application by Does not apply route daily.  1 Bottle  0   No current facility-administered medications for this visit.     Allergies (verified) Aspirin; Ciprofloxacin; Codeine; Micardis; and Rofecoxib   PAST HISTORY  Family History Family History  Problem Relation Age of Onset  . Colon cancer Father   . Prostate cancer Father   . Diabetes Father   . Coronary artery disease Mother 66  . Heart disease Mother   . Diabetes Mother   . Coronary artery disease Sister 14  . Diabetes Sister   . Heart disease Sister   . Diabetes Maternal Grandmother   . Diabetes Sister   . Diabetes Sister   . Diabetes Sister   . Diabetes Sister   . Diabetes Sister     Social History History  Substance Use Topics  . Smoking status: Never Smoker   . Smokeless tobacco: Never Used  . Alcohol Use: No     Are there smokers in your home (other than you)? Yes  Risk Factors Current exercise habits: The patient does not participate in regular exercise at present.  Dietary issues discussed: ADA diet - low CHO, increase vegetables.   Cardiac risk factors: advanced age (older than 44 for men, 39 for women), diabetes mellitus,  dyslipidemia, family history of premature cardiovascular disease, hypertension, obesity (BMI >= 30 kg/m2) and sedentary lifestyle.  Depression Screen (Note: if answer to either of the following is "Yes", a more complete depression screening is indicated)   Over the past 2 weeks, have you felt down, depressed or hopeless? No  Over the past 2 weeks,  have you felt little interest or pleasure in doing things? No  Have you lost interest or pleasure in daily life? No  Do you often feel hopeless? No  Do you cry easily over simple problems? No  Activities of Daily Living In your present state of health, do you have any difficulty performing the following activities?:  Driving? Yes Managing money?  No Feeding yourself? No Getting from bed to chair? No Climbing a flight of stairs? No Preparing food and eating?: No Bathing or showering? No Getting dressed: No Getting to the toilet? No Using the toilet:No Moving around from place to place: No In the past year have you fallen or had a near fall?:No   Are you sexually active?  No  Do you have more than one partner?  No  Hearing Difficulties: No Do you often ask people to speak up or repeat themselves? No Do you experience ringing or noises in your ears? No Do you have difficulty understanding soft or whispered voices? No   Do you feel that you have a problem with memory? No  Do you often misplace items? No  Do you feel safe at home?  Yes  Cognitive Testing  Alert? Yes  Normal Appearance?Yes  Oriented to person? Yes  Place? Yes   Time? Yes  Recall of three objects?  Yes  Can perform simple calculations? Yes  Displays appropriate judgment?Yes  Can read the correct time from a watch face?Yes   Advanced Directives have been discussed with the patient? Yes  List the Names of Other Physician/Practitioners you currently use: 1.  Deloria Lair - opthalmologist 2.  [place] - Nephrologist 3.  Jon Billings - Cardiologist 4.  Delfin Edis  - Gaastroenterologist  Indicate any recent Medical Services you may have received from other than [facility] providers in the past year (date may be approximate).  Immunization History  Administered Date(s) Administered  . Influenza,inj,Quad PF,36+ Mos 02/16/2013  . Pneumococcal Polysaccharide-23 10/10/2012  . Tdap 10/06/2012    Screening Tests Health Maintenance  Topic Date Due  . Mammogram  10/17/2013 (Originally 06/04/1995)  . Zostavax  01/17/2014 (Originally 06/03/2005)  . Influenza Vaccine  12/22/2013  . Hemoglobin A1c  01/17/2014  . Urine Microalbumin  02/17/2014  . Ophthalmology Exam  07/13/2014  . Foot Exam  07/20/2014  . Colonoscopy  10/23/2016  . Tetanus/tdap  10/07/2022  . Pneumococcal Polysaccharide Vaccine Age 29 And Over  Addressed   Last dilated eye exam was 07/09/2013 by Dr [name] Also performed glaucoma screening (applanation):  OD = 26mmHG;  OS = 85mmHg  Last DEXA was 2010 - past due to be checked.  All answers were reviewed with the patient and necessary referrals were made:  Cherre Robins, [facility]   09/06/2013   History reviewed: allergies, current medications, past family history, past medical history, past social history, past surgical history and problem list Also discussed patient's BG control.  HBG readings have improved.  Average = 130.  Range 105 - 198.Marland Kitchen Patient is tolerating onglyza 2.5mg  daily well.   Objective:  Body mass index is 32.65 kg/(m^2). BP 130/74  Pulse 72  Ht 5' 2.5" (1.588 m)  Wt 181 lb 8 oz (82.328 kg)  BMI 32.65 kg/m2    Assessment:     Annual Medicare Wellness Visit Type 2 DM      Plan:     During the course of the visit the patient was educated and counseled about appropriate screening and preventive services including:    Pneumococcal  vaccine   Influenza vaccine  Hepatitis B vaccine  Td vaccine  Screening electrocardiogram  Screening mammography  Screening Pap smear and pelvic exam   Bone densitometry  screening  Colorectal cancer screening  Glaucoma screening  Nutrition counseling   Advanced directives: has an advanced directive - a copy HAS NOT been provided.  Referral made for DEXA (at patient request will do in 2 months due to multiple appts in May and June) Mammogram suggested - patient to call to make appt Recommended Silver Sneakers program for exercise Checked on cost of Zostavax - was about $115 - patient decided to postpone due to cost Continue Onglyze 2.5mg  1 tablet daily Appt made to follow up and labs with PCP in 1 month.  Patient Instructions (the written plan) was given to the patient.  Medicare Attestation I have personally reviewed: The patient's medical and social history Their use of alcohol, tobacco or illicit drugs Their current medications and supplements The patient's functional ability including ADLs,fall risks, home safety risks, cognitive, and hearing and visual impairment Diet and physical activities Evidence for depression or mood disorders  The patient's weight, height, BMI, and BP/HR have been recorded in the chart.  I have made referrals, counseling, and provided education to the patient based on review of the above and I have provided the patient with a written personalized care plan for preventive services.     Cherre Robins, [facility]   09/06/2013

## 2013-09-06 NOTE — Patient Instructions (Signed)
Health Maintenance Summary    MAMMOGRAM Due  Now      ZOSTAVAX Postponed 01/17/2014 Cost as today 09/06/13 would be about $115    INFLUENZA VACCINE Next Due 12/22/2013  last was 02/16/2013    HEMOGLOBIN A1C Next Due 10/17/2013  Last A1c was 8.6% on 07/20/13    URINE MICROALBUMIN Next Due 10/17/2013 Last was 01/2014 and was elevated    OPHTHALMOLOGY EXAM Next Due 07/13/2014  last 07/09/2013    FOOT EXAM Next Due 07/20/2014     DEXA / bone density Next Due NOW Last was 2010   Pneumonia vaccine completed  Last was 10/06/2012   PAP Smear Every 2 years      COLONOSCOPY Next Due 10/23/2016  last was 10/24/2006    TETANUS/TDAP Next Due 10/07/2022  Last was 10/06/2012        Preventive Care for Adults, Female A healthy lifestyle and preventive care can promote health and wellness. Preventive health guidelines for women include the following key practices.  A routine yearly physical is a good way to check with your health care provider about your health and preventive screening. It is a chance to share any concerns and updates on your health and to receive a thorough exam.  Visit your dentist for a routine exam and preventive care every 6 months. Brush your teeth twice a day and floss once a day. Good oral hygiene prevents tooth decay and gum disease.  The frequency of eye exams is based on your age, health, family medical history, use of contact lenses, and other factors. Follow your health care provider's recommendations for frequency of eye exams.  Eat a healthy diet. Foods like vegetables, fruits, whole grains, low-fat dairy products, and lean protein foods contain the nutrients you need without too many calories. Decrease your intake of foods high in solid fats, added sugars, and salt. Eat the right amount of calories for you.Get information about a proper diet from your health care provider, if necessary.  Regular physical exercise is one of the most important things you can do for your health. Most  adults should get at least 150 minutes of moderate-intensity exercise (any activity that increases your heart rate and causes you to sweat) each week. In addition, most adults need muscle-strengthening exercises on 2 or more days a week.  Maintain a healthy weight. The body mass index (BMI) is a screening tool to identify possible weight problems. It provides an estimate of body fat based on height and weight. Your health care provider can find your BMI, and can help you achieve or maintain a healthy weight.For adults 20 years and older:  A BMI below 18.5 is considered underweight.  A BMI of 18.5 to 24.9 is normal.  A BMI of 25 to 29.9 is considered overweight.  A BMI of 30 and above is considered obese.  Maintain normal blood lipids and cholesterol levels by exercising and minimizing your intake of saturated fat. Eat a balanced diet with plenty of fruit and vegetables. Blood tests for lipids and cholesterol should begin at age 33 and be repeated every 5 years. If your lipid or cholesterol levels are high, you are over 50, or you are at high risk for heart disease, you may need your cholesterol levels checked more frequently.Ongoing high lipid and cholesterol levels should be treated with medicines if diet and exercise are not working.  If you smoke, find out from your health care provider how to quit. If you do not use tobacco,  do not start.  Lung cancer screening is recommended for adults aged 22 80 years who are at high risk for developing lung cancer because of a history of smoking. A yearly low-dose CT scan of the lungs is recommended for people who have at least a 30-pack-year history of smoking and are a current smoker or have quit within the past 15 years. A pack year of smoking is smoking an average of 1 pack of cigarettes a day for 1 year (for example: 1 pack a day for 30 years or 2 packs a day for 15 years). Yearly screening should continue until the smoker has stopped smoking for at  least 15 years. Yearly screening should be stopped for people who develop a health problem that would prevent them from having lung cancer treatment.  If you are pregnant, do not drink alcohol. If you are breastfeeding, be very cautious about drinking alcohol. If you are not pregnant and choose to drink alcohol, do not have more than 1 drink per day. One drink is considered to be 12 ounces (355 mL) of beer, 5 ounces (148 mL) of wine, or 1.5 ounces (44 mL) of liquor.  Avoid use of street drugs. Do not share needles with anyone. Ask for help if you need support or instructions about stopping the use of drugs.  High blood pressure causes heart disease and increases the risk of stroke. Your blood pressure should be checked at least every 1 to 2 years. Ongoing high blood pressure should be treated with medicines if weight loss and exercise do not work.  If you are 6 68 years old, ask your health care provider if you should take aspirin to prevent strokes.  Diabetes screening involves taking a blood sample to check your fasting blood sugar level. This should be done once every 3 years, after age 42, if you are within normal weight and without risk factors for diabetes. Testing should be considered at a younger age or be carried out more frequently if you are overweight and have at least 1 risk factor for diabetes.  Breast cancer screening is essential preventive care for women. You should practice "breast self-awareness." This means understanding the normal appearance and feel of your breasts and may include breast self-examination. Any changes detected, no matter how small, should be reported to a health care provider. Women in their 10s and 30s should have a clinical breast exam (CBE) by a health care provider as part of a regular health exam every 1 to 3 years. After age 49, women should have a CBE every year. Starting at age 29, women should consider having a mammogram (breast X-ray test) every year.  Women who have a family history of breast cancer should talk to their health care provider about genetic screening. Women at a high risk of breast cancer should talk to their health care providers about having an MRI and a mammogram every year.  Breast cancer gene (BRCA)-related cancer risk assessment is recommended for women who have family members with BRCA-related cancers. BRCA-related cancers include breast, ovarian, tubal, and peritoneal cancers. Having family members with these cancers may be associated with an increased risk for harmful changes (mutations) in the breast cancer genes BRCA1 and BRCA2. Results of the assessment will determine the need for genetic counseling and BRCA1 and BRCA2 testing.  The Pap test is a screening test for cervical cancer. A Pap test can show cell changes on the cervix that might become cervical cancer if left untreated. A  Pap test is a procedure in which cells are obtained and examined from the lower end of the uterus (cervix).  Women should have a Pap test starting at age 59.  Between ages 20 and 38, Pap tests should be repeated every 2 years.  Beginning at age 75, you should have a Pap test every 3 years as long as the past 3 Pap tests have been normal.  Some women have medical problems that increase the chance of getting cervical cancer. Talk to your health care provider about these problems. It is especially important to talk to your health care provider if a new problem develops soon after your last Pap test. In these cases, your health care provider may recommend more frequent screening and Pap tests.  The above recommendations are the same for women who have or have not gotten the vaccine for human papillomavirus (HPV).  If you had a hysterectomy for a problem that was not cancer or a condition that could lead to cancer, then you no longer need Pap tests. Even if you no longer need a Pap test, a regular exam is a good idea to make sure no other problems  are starting.  If you are between ages 52 and 21 years, and you have had normal Pap tests going back 10 years, you no longer need Pap tests. Even if you no longer need a Pap test, a regular exam is a good idea to make sure no other problems are starting.  If you have had past treatment for cervical cancer or a condition that could lead to cancer, you need Pap tests and screening for cancer for at least 20 years after your treatment.  If Pap tests have been discontinued, risk factors (such as a new sexual partner) need to be reassessed to determine if screening should be resumed.  The HPV test is an additional test that may be used for cervical cancer screening. The HPV test looks for the virus that can cause the cell changes on the cervix. The cells collected during the Pap test can be tested for HPV. The HPV test could be used to screen women aged 82 years and older, and should be used in women of any age who have unclear Pap test results. After the age of 10, women should have HPV testing at the same frequency as a Pap test.  Colorectal cancer can be detected and often prevented. Most routine colorectal cancer screening begins at the age of 65 years and continues through age 43 years. However, your health care provider may recommend screening at an earlier age if you have risk factors for colon cancer. On a yearly basis, your health care provider may provide home test kits to check for hidden blood in the stool. Use of a small camera at the end of a tube, to directly examine the colon (sigmoidoscopy or colonoscopy), can detect the earliest forms of colorectal cancer. Talk to your health care provider about this at age 2, when routine screening begins. Direct exam of the colon should be repeated every 5 10 years through age 25 years, unless early forms of pre-cancerous polyps or small growths are found.  People who are at an increased risk for hepatitis B should be screened for this virus. You are  considered at high risk for hepatitis B if:  You were born in a country where hepatitis B occurs often. Talk with your health care provider about which countries are considered high risk.  Your parents were  born in a high-risk country and you have not received a shot to protect against hepatitis B (hepatitis B vaccine).  You have HIV or AIDS.  You use needles to inject street drugs.  You live with, or have sex with, someone who has Hepatitis B.  You get hemodialysis treatment.  You take certain medicines for conditions like cancer, organ transplantation, and autoimmune conditions.  Hepatitis C blood testing is recommended for all people born from [birth year] through 1965 and any individual with known risks for hepatitis C.  Practice safe sex. Use condoms and avoid high-risk sexual practices to reduce the spread of sexually transmitted infections (STIs). STIs include gonorrhea, chlamydia, syphilis, trichomonas, herpes, HPV, and human immunodeficiency virus (HIV). Herpes, HIV, and HPV are viral illnesses that have no cure. They can result in disability, cancer, and death. Sexually active women aged 58 years and younger should be checked for chlamydia. Older women with new or multiple partners should also be tested for chlamydia. Testing for other STIs is recommended if you are sexually active and at increased risk.  Osteoporosis is a disease in which the bones lose minerals and strength with aging. This can result in serious bone fractures or breaks. The risk of osteoporosis can be identified using a bone density scan. Women ages 23 years and over and women at risk for fractures or osteoporosis should discuss screening with their health care providers. Ask your health care provider whether you should take a calcium supplement or vitamin D to reduce the rate of osteoporosis.  Menopause can be associated with physical symptoms and risks. Hormone replacement therapy is available to decrease symptoms and  risks. You should talk to your health care provider about whether hormone replacement therapy is right for you.  Use sunscreen. Apply sunscreen liberally and repeatedly throughout the day. You should seek shade when your shadow is shorter than you. Protect yourself by wearing long sleeves, pants, a wide-brimmed hat, and sunglasses year round, whenever you are outdoors.  Once a month, do a whole body skin exam, using a mirror to look at the skin on your back. Tell your health care provider of new moles, moles that have irregular borders, moles that are larger than a pencil eraser, or moles that have changed in shape or color.  Stay current with required vaccines (immunizations).  Influenza vaccine. All adults should be immunized every year.  Tetanus, diphtheria, and acellular pertussis (Td, Tdap) vaccine. Pregnant women should receive 1 dose of Tdap vaccine during each pregnancy. The dose should be obtained regardless of the length of time since the last dose. Immunization is preferred during the 27th 36th week of gestation. An adult who has not previously received Tdap or who does not know her vaccine status should receive 1 dose of Tdap. This initial dose should be followed by tetanus and diphtheria toxoids (Td) booster doses every 10 years. Adults with an unknown or incomplete history of completing a 3-dose immunization series with Td-containing vaccines should begin or complete a primary immunization series including a Tdap dose. Adults should receive a Td booster every 10 years.  Varicella vaccine. An adult without evidence of immunity to varicella should receive 2 doses or a second dose if she has previously received 1 dose. Pregnant females who do not have evidence of immunity should receive the first dose after pregnancy. This first dose should be obtained before leaving the health care facility. The second dose should be obtained 4 8 weeks after the first dose.  Human papillomavirus (  HPV)  vaccine. Females aged 72 26 years who have not received the vaccine previously should obtain the 3-dose series. The vaccine is not recommended for use in pregnant females. However, pregnancy testing is not needed before receiving a dose. If a female is found to be pregnant after receiving a dose, no treatment is needed. In that case, the remaining doses should be delayed until after the pregnancy. Immunization is recommended for any person with an immunocompromised condition through the age of 20 years if she did not get any or all doses earlier. During the 3-dose series, the second dose should be obtained 4 8 weeks after the first dose. The third dose should be obtained 24 weeks after the first dose and 16 weeks after the second dose.  Zoster vaccine. One dose is recommended for adults aged 64 years or older unless certain conditions are present.  Measles, mumps, and rubella (MMR) vaccine. Adults born before [birth year] generally are considered immune to measles and mumps. Adults born in [birth year] or later should have 1 or more doses of MMR vaccine unless there is a contraindication to the vaccine or there is laboratory evidence of immunity to each of the three diseases. A routine second dose of MMR vaccine should be obtained at least 28 days after the first dose for students attending postsecondary schools, health care workers, or international travelers. People who received inactivated measles vaccine or an unknown type of measles vaccine during 1963 1967 should receive 2 doses of MMR vaccine. People who received inactivated mumps vaccine or an unknown type of mumps vaccine before 1979 and are at high risk for mumps infection should consider immunization with 2 doses of MMR vaccine. For females of childbearing age, rubella immunity should be determined. If there is no evidence of immunity, females who are not pregnant should be vaccinated. If there is no evidence of immunity, females who are pregnant should delay  immunization until after pregnancy. Unvaccinated health care workers born before [birth year] who lack laboratory evidence of measles, mumps, or rubella immunity or laboratory confirmation of disease should consider measles and mumps immunization with 2 doses of MMR vaccine or rubella immunization with 1 dose of MMR vaccine.  Pneumococcal 13-valent conjugate (PCV13) vaccine. When indicated, a person who is uncertain of her immunization history and has no record of immunization should receive the PCV13 vaccine. An adult aged 45 years or older who has certain medical conditions and has not been previously immunized should receive 1 dose of PCV13 vaccine. This PCV13 should be followed with a dose of pneumococcal polysaccharide (PPSV23) vaccine. The PPSV23 vaccine dose should be obtained at least 8 weeks after the dose of PCV13 vaccine. An adult aged 43 years or older who has certain medical conditions and previously received 1 or more doses of PPSV23 vaccine should receive 1 dose of PCV13. The PCV13 vaccine dose should be obtained 1 or more years after the last PPSV23 vaccine dose.  Pneumococcal polysaccharide (PPSV23) vaccine. When PCV13 is also indicated, PCV13 should be obtained first. All adults aged 45 years and older should be immunized. An adult younger than age 19 years who has certain medical conditions should be immunized. Any person who resides in a nursing home or long-term care facility should be immunized. An adult smoker should be immunized. People with an immunocompromised condition and certain other conditions should receive both PCV13 and PPSV23 vaccines. People with human immunodeficiency virus (HIV) infection should be immunized as soon as possible after diagnosis. Immunization during chemotherapy or  radiation therapy should be avoided. Routine use of PPSV23 vaccine is not recommended for American Indians, [place]atives, or people younger than 65 years unless there are medical conditions that require  PPSV23 vaccine. When indicated, people who have unknown immunization and have no record of immunization should receive PPSV23 vaccine. One-time revaccination 5 years after the first dose of PPSV23 is recommended for people aged 35 64 years who have chronic kidney failure, nephrotic syndrome, asplenia, or immunocompromised conditions. People who received 1 2 doses of PPSV23 before age 19 years should receive another dose of PPSV23 vaccine at age 58 years or later if at least 5 years have passed since the previous dose. Doses of PPSV23 are not needed for people immunized with PPSV23 at or after age 81 years.  Hepatitis A vaccine. Adults who wish to be protected from this disease, have certain high-risk conditions, work with hepatitis A-infected animals, work in hepatitis A research labs, or travel to or work in countries with a high rate of hepatitis A should be immunized. Adults who were previously unvaccinated and who anticipate close contact with an international adoptee during the first 60 days after arrival in the [place] from a country with a high rate of hepatitis A should be immunized.  Hepatitis B vaccine. Adults who wish to be protected from this disease, have certain high-risk conditions, may be exposed to blood or other infectious body fluids, are household contacts or sex partners of hepatitis B positive people, are clients or workers in certain care facilities, or travel to or work in countries with a high rate of hepatitis B should be immunized.   Ages 14 years and over  Blood pressure check.** / Every 1 to 2 years.  Lipid and cholesterol check.** / Every 5 years beginning at age 21 years.  Lung cancer screening. / Every year if you are aged 11 80 years and have a 30-pack-year history of smoking and currently smoke or have quit within the past 15 years. Yearly screening is stopped once you have quit smoking for at least 15 years or develop a health problem that would prevent you from  having lung cancer treatment.  Clinical breast exam.** / Every year after age 52 years.  BRCA-related cancer risk assessment.** / For women who have family members with a BRCA-related cancer (breast, ovarian, tubal, or peritoneal cancers).  Mammogram.** / Every year beginning at age 82 years and continuing for as long as you are in good health. Consult with your health care provider.  Pap test.** / Every 3 years starting at age 35 years through age 54 or 54 years with 3 consecutive normal Pap tests. Testing can be stopped between 65 and 70 years with 3 consecutive normal Pap tests and no abnormal Pap or HPV tests in the past 10 years.  HPV screening.** / Every 3 years from ages 31 years through ages 49 or 22 years with a history of 3 consecutive normal Pap tests. Testing can be stopped between 65 and 70 years with 3 consecutive normal Pap tests and no abnormal Pap or HPV tests in the past 10 years.  Fecal occult blood test (FOBT) of stool. / Every year beginning at age 29 years and continuing until age 1 years. You may not need to do this test if you get a colonoscopy every 10 years.  Flexible sigmoidoscopy or colonoscopy.** / Every 5 years for a flexible sigmoidoscopy or every 10 years for a colonoscopy beginning at age 48 years and continuing  until age 53 years.  Hepatitis C blood test.** / For all people born from [birth year] through 1965 and any individual with known risks for hepatitis C.  Osteoporosis screening.** / A one-time screening for women ages 16 years and over and women at risk for fractures or osteoporosis.  Skin self-exam. / Monthly.  Influenza vaccine. / Every year.  Tetanus, diphtheria, and acellular pertussis (Tdap/Td) vaccine.** / 1 dose of Td every 10 years.  Varicella vaccine.** / Consult your health care provider.  Zoster vaccine.** / 1 dose for adults aged 48 years or older.  Pneumococcal 13-valent conjugate (PCV13) vaccine.** / Consult your health care  provider.  Pneumococcal polysaccharide (PPSV23) vaccine.** / 1 dose for all adults aged 41 years and older.  Meningococcal vaccine.** / Consult your health care provider.  Hepatitis A vaccine.** / Consult your health care provider.  Hepatitis B vaccine.** / Consult your health care provider.  Haemophilus influenzae type b (Hib) vaccine.** / Consult your health care provider. ** Family history and personal history of risk and conditions may change your health care provider's recommendations. Document Released: 07/06/2001 Document Revised: 02/28/2013 Document Reviewed: 10/05/2010 [facility] Patient Information 2014 [place], [place].

## 2013-09-10 DIAGNOSIS — R809 Proteinuria, unspecified: Secondary | ICD-10-CM | POA: Diagnosis not present

## 2013-09-10 DIAGNOSIS — D631 Anemia in chronic kidney disease: Secondary | ICD-10-CM | POA: Diagnosis not present

## 2013-09-10 DIAGNOSIS — L8993 Pressure ulcer of unspecified site, stage 3: Secondary | ICD-10-CM | POA: Diagnosis not present

## 2013-09-10 DIAGNOSIS — N2581 Secondary hyperparathyroidism of renal origin: Secondary | ICD-10-CM | POA: Diagnosis not present

## 2013-09-10 DIAGNOSIS — I509 Heart failure, unspecified: Secondary | ICD-10-CM | POA: Diagnosis not present

## 2013-09-10 DIAGNOSIS — L89109 Pressure ulcer of unspecified part of back, unspecified stage: Secondary | ICD-10-CM | POA: Diagnosis not present

## 2013-09-10 DIAGNOSIS — I129 Hypertensive chronic kidney disease with stage 1 through stage 4 chronic kidney disease, or unspecified chronic kidney disease: Secondary | ICD-10-CM | POA: Diagnosis not present

## 2013-09-10 DIAGNOSIS — N183 Chronic kidney disease, stage 3 unspecified: Secondary | ICD-10-CM | POA: Diagnosis not present

## 2013-09-10 DIAGNOSIS — E119 Type 2 diabetes mellitus without complications: Secondary | ICD-10-CM | POA: Diagnosis not present

## 2013-09-11 ENCOUNTER — Encounter: Payer: Self-pay | Admitting: Internal Medicine

## 2013-09-12 DIAGNOSIS — N183 Chronic kidney disease, stage 3 unspecified: Secondary | ICD-10-CM | POA: Diagnosis not present

## 2013-09-12 DIAGNOSIS — I129 Hypertensive chronic kidney disease with stage 1 through stage 4 chronic kidney disease, or unspecified chronic kidney disease: Secondary | ICD-10-CM | POA: Diagnosis not present

## 2013-09-12 DIAGNOSIS — I509 Heart failure, unspecified: Secondary | ICD-10-CM | POA: Diagnosis not present

## 2013-09-12 DIAGNOSIS — L89109 Pressure ulcer of unspecified part of back, unspecified stage: Secondary | ICD-10-CM | POA: Diagnosis not present

## 2013-09-12 DIAGNOSIS — E119 Type 2 diabetes mellitus without complications: Secondary | ICD-10-CM | POA: Diagnosis not present

## 2013-09-12 DIAGNOSIS — L8993 Pressure ulcer of unspecified site, stage 3: Secondary | ICD-10-CM | POA: Diagnosis not present

## 2013-09-14 DIAGNOSIS — E119 Type 2 diabetes mellitus without complications: Secondary | ICD-10-CM | POA: Diagnosis not present

## 2013-09-14 DIAGNOSIS — L89109 Pressure ulcer of unspecified part of back, unspecified stage: Secondary | ICD-10-CM | POA: Diagnosis not present

## 2013-09-14 DIAGNOSIS — I509 Heart failure, unspecified: Secondary | ICD-10-CM | POA: Diagnosis not present

## 2013-09-14 DIAGNOSIS — N183 Chronic kidney disease, stage 3 unspecified: Secondary | ICD-10-CM | POA: Diagnosis not present

## 2013-09-14 DIAGNOSIS — I129 Hypertensive chronic kidney disease with stage 1 through stage 4 chronic kidney disease, or unspecified chronic kidney disease: Secondary | ICD-10-CM | POA: Diagnosis not present

## 2013-09-14 DIAGNOSIS — L8993 Pressure ulcer of unspecified site, stage 3: Secondary | ICD-10-CM | POA: Diagnosis not present

## 2013-09-18 ENCOUNTER — Encounter (HOSPITAL_BASED_OUTPATIENT_CLINIC_OR_DEPARTMENT_OTHER): Payer: Medicare Other | Attending: General Surgery

## 2013-09-18 DIAGNOSIS — L98499 Non-pressure chronic ulcer of skin of other sites with unspecified severity: Secondary | ICD-10-CM | POA: Insufficient documentation

## 2013-09-19 DIAGNOSIS — I129 Hypertensive chronic kidney disease with stage 1 through stage 4 chronic kidney disease, or unspecified chronic kidney disease: Secondary | ICD-10-CM | POA: Diagnosis not present

## 2013-09-19 DIAGNOSIS — I509 Heart failure, unspecified: Secondary | ICD-10-CM | POA: Diagnosis not present

## 2013-09-19 DIAGNOSIS — E119 Type 2 diabetes mellitus without complications: Secondary | ICD-10-CM | POA: Diagnosis not present

## 2013-09-19 DIAGNOSIS — L89109 Pressure ulcer of unspecified part of back, unspecified stage: Secondary | ICD-10-CM | POA: Diagnosis not present

## 2013-09-19 DIAGNOSIS — N183 Chronic kidney disease, stage 3 unspecified: Secondary | ICD-10-CM | POA: Diagnosis not present

## 2013-09-19 DIAGNOSIS — L8993 Pressure ulcer of unspecified site, stage 3: Secondary | ICD-10-CM | POA: Diagnosis not present

## 2013-09-21 DIAGNOSIS — I129 Hypertensive chronic kidney disease with stage 1 through stage 4 chronic kidney disease, or unspecified chronic kidney disease: Secondary | ICD-10-CM | POA: Diagnosis not present

## 2013-09-21 DIAGNOSIS — I509 Heart failure, unspecified: Secondary | ICD-10-CM | POA: Diagnosis not present

## 2013-09-21 DIAGNOSIS — E119 Type 2 diabetes mellitus without complications: Secondary | ICD-10-CM | POA: Diagnosis not present

## 2013-09-21 DIAGNOSIS — L89109 Pressure ulcer of unspecified part of back, unspecified stage: Secondary | ICD-10-CM | POA: Diagnosis not present

## 2013-09-21 DIAGNOSIS — N183 Chronic kidney disease, stage 3 unspecified: Secondary | ICD-10-CM | POA: Diagnosis not present

## 2013-09-21 DIAGNOSIS — L8993 Pressure ulcer of unspecified site, stage 3: Secondary | ICD-10-CM | POA: Diagnosis not present

## 2013-09-24 DIAGNOSIS — I129 Hypertensive chronic kidney disease with stage 1 through stage 4 chronic kidney disease, or unspecified chronic kidney disease: Secondary | ICD-10-CM | POA: Diagnosis not present

## 2013-09-24 DIAGNOSIS — L89109 Pressure ulcer of unspecified part of back, unspecified stage: Secondary | ICD-10-CM | POA: Diagnosis not present

## 2013-09-24 DIAGNOSIS — E119 Type 2 diabetes mellitus without complications: Secondary | ICD-10-CM | POA: Diagnosis not present

## 2013-09-24 DIAGNOSIS — L8993 Pressure ulcer of unspecified site, stage 3: Secondary | ICD-10-CM | POA: Diagnosis not present

## 2013-09-24 DIAGNOSIS — I509 Heart failure, unspecified: Secondary | ICD-10-CM | POA: Diagnosis not present

## 2013-09-24 DIAGNOSIS — N183 Chronic kidney disease, stage 3 unspecified: Secondary | ICD-10-CM | POA: Diagnosis not present

## 2013-09-25 ENCOUNTER — Encounter (HOSPITAL_COMMUNITY)
Admission: RE | Admit: 2013-09-25 | Discharge: 2013-09-25 | Disposition: A | Discharge status: Home / Self Care | Payer: Medicare Other | Source: Ambulatory Visit | Attending: Nephrology | Admitting: Nephrology

## 2013-09-25 DIAGNOSIS — D638 Anemia in other chronic diseases classified elsewhere: Secondary | ICD-10-CM | POA: Diagnosis not present

## 2013-09-25 DIAGNOSIS — N183 Chronic kidney disease, stage 3 unspecified: Secondary | ICD-10-CM | POA: Insufficient documentation

## 2013-09-25 LAB — BASIC METABOLIC PANEL
BUN: 31 mg/dL — AB (ref 6–23)
CALCIUM: 8.7 mg/dL (ref 8.4–10.5)
CHLORIDE: 108 meq/L (ref 96–112)
CO2: 23 mEq/L (ref 19–32)
CREATININE: 1.45 mg/dL — AB (ref 0.50–1.10)
GFR, EST AFRICAN AMERICAN: 42 mL/min — AB (ref >90)
GFR, EST NON AFRICAN AMERICAN: 36 mL/min — AB (ref >90)
Glucose, Bld: 164 mg/dL — ABNORMAL HIGH (ref 70–99)
Potassium: 4.7 mEq/L (ref 3.7–5.3)
Sodium: 143 mEq/L (ref 137–147)

## 2013-09-25 LAB — POCT HEMOGLOBIN-HEMACUE: Hemoglobin: 11 g/dL — ABNORMAL LOW (ref 12.0–15.0)

## 2013-09-25 MED ORDER — DARBEPOETIN ALFA-POLYSORBATE 100 MCG/0.5ML IJ SOLN
INTRAMUSCULAR | Status: AC
  Filled 2013-09-25: qty 0.5

## 2013-09-25 MED ORDER — DARBEPOETIN ALFA-POLYSORBATE 100 MCG/0.5ML IJ SOLN
100.0000 ug | INTRAMUSCULAR | Status: DC
  Administered 2013-09-25: 100 ug via SUBCUTANEOUS

## 2013-09-26 DIAGNOSIS — I129 Hypertensive chronic kidney disease with stage 1 through stage 4 chronic kidney disease, or unspecified chronic kidney disease: Secondary | ICD-10-CM | POA: Diagnosis not present

## 2013-09-26 DIAGNOSIS — I509 Heart failure, unspecified: Secondary | ICD-10-CM | POA: Diagnosis not present

## 2013-09-26 DIAGNOSIS — E119 Type 2 diabetes mellitus without complications: Secondary | ICD-10-CM | POA: Diagnosis not present

## 2013-09-26 DIAGNOSIS — L8993 Pressure ulcer of unspecified site, stage 3: Secondary | ICD-10-CM | POA: Diagnosis not present

## 2013-09-26 DIAGNOSIS — N183 Chronic kidney disease, stage 3 unspecified: Secondary | ICD-10-CM | POA: Diagnosis not present

## 2013-09-26 DIAGNOSIS — L89109 Pressure ulcer of unspecified part of back, unspecified stage: Secondary | ICD-10-CM | POA: Diagnosis not present

## 2013-09-28 DIAGNOSIS — I129 Hypertensive chronic kidney disease with stage 1 through stage 4 chronic kidney disease, or unspecified chronic kidney disease: Secondary | ICD-10-CM | POA: Diagnosis not present

## 2013-09-28 DIAGNOSIS — L89109 Pressure ulcer of unspecified part of back, unspecified stage: Secondary | ICD-10-CM | POA: Diagnosis not present

## 2013-09-28 DIAGNOSIS — N183 Chronic kidney disease, stage 3 unspecified: Secondary | ICD-10-CM | POA: Diagnosis not present

## 2013-09-28 DIAGNOSIS — E119 Type 2 diabetes mellitus without complications: Secondary | ICD-10-CM | POA: Diagnosis not present

## 2013-09-28 DIAGNOSIS — I509 Heart failure, unspecified: Secondary | ICD-10-CM | POA: Diagnosis not present

## 2013-09-28 DIAGNOSIS — L8993 Pressure ulcer of unspecified site, stage 3: Secondary | ICD-10-CM | POA: Diagnosis not present

## 2013-10-01 DIAGNOSIS — I509 Heart failure, unspecified: Secondary | ICD-10-CM | POA: Diagnosis not present

## 2013-10-01 DIAGNOSIS — L89109 Pressure ulcer of unspecified part of back, unspecified stage: Secondary | ICD-10-CM | POA: Diagnosis not present

## 2013-10-01 DIAGNOSIS — E119 Type 2 diabetes mellitus without complications: Secondary | ICD-10-CM | POA: Diagnosis not present

## 2013-10-01 DIAGNOSIS — I129 Hypertensive chronic kidney disease with stage 1 through stage 4 chronic kidney disease, or unspecified chronic kidney disease: Secondary | ICD-10-CM | POA: Diagnosis not present

## 2013-10-01 DIAGNOSIS — N183 Chronic kidney disease, stage 3 unspecified: Secondary | ICD-10-CM | POA: Diagnosis not present

## 2013-10-01 DIAGNOSIS — L8993 Pressure ulcer of unspecified site, stage 3: Secondary | ICD-10-CM | POA: Diagnosis not present

## 2013-10-03 DIAGNOSIS — L8993 Pressure ulcer of unspecified site, stage 3: Secondary | ICD-10-CM | POA: Diagnosis not present

## 2013-10-03 DIAGNOSIS — L89109 Pressure ulcer of unspecified part of back, unspecified stage: Secondary | ICD-10-CM | POA: Diagnosis not present

## 2013-10-03 DIAGNOSIS — I509 Heart failure, unspecified: Secondary | ICD-10-CM | POA: Diagnosis not present

## 2013-10-03 DIAGNOSIS — N183 Chronic kidney disease, stage 3 unspecified: Secondary | ICD-10-CM | POA: Diagnosis not present

## 2013-10-03 DIAGNOSIS — E119 Type 2 diabetes mellitus without complications: Secondary | ICD-10-CM | POA: Diagnosis not present

## 2013-10-03 DIAGNOSIS — I129 Hypertensive chronic kidney disease with stage 1 through stage 4 chronic kidney disease, or unspecified chronic kidney disease: Secondary | ICD-10-CM | POA: Diagnosis not present

## 2013-10-04 DIAGNOSIS — L89109 Pressure ulcer of unspecified part of back, unspecified stage: Secondary | ICD-10-CM | POA: Diagnosis not present

## 2013-10-04 DIAGNOSIS — E119 Type 2 diabetes mellitus without complications: Secondary | ICD-10-CM | POA: Diagnosis not present

## 2013-10-04 DIAGNOSIS — I509 Heart failure, unspecified: Secondary | ICD-10-CM | POA: Diagnosis not present

## 2013-10-04 DIAGNOSIS — I129 Hypertensive chronic kidney disease with stage 1 through stage 4 chronic kidney disease, or unspecified chronic kidney disease: Secondary | ICD-10-CM | POA: Diagnosis not present

## 2013-10-04 DIAGNOSIS — N183 Chronic kidney disease, stage 3 unspecified: Secondary | ICD-10-CM | POA: Diagnosis not present

## 2013-10-05 DIAGNOSIS — I129 Hypertensive chronic kidney disease with stage 1 through stage 4 chronic kidney disease, or unspecified chronic kidney disease: Secondary | ICD-10-CM | POA: Diagnosis not present

## 2013-10-05 DIAGNOSIS — E119 Type 2 diabetes mellitus without complications: Secondary | ICD-10-CM | POA: Diagnosis not present

## 2013-10-05 DIAGNOSIS — L89109 Pressure ulcer of unspecified part of back, unspecified stage: Secondary | ICD-10-CM | POA: Diagnosis not present

## 2013-10-05 DIAGNOSIS — N183 Chronic kidney disease, stage 3 unspecified: Secondary | ICD-10-CM | POA: Diagnosis not present

## 2013-10-05 DIAGNOSIS — I509 Heart failure, unspecified: Secondary | ICD-10-CM | POA: Diagnosis not present

## 2013-10-08 DIAGNOSIS — I509 Heart failure, unspecified: Secondary | ICD-10-CM | POA: Diagnosis not present

## 2013-10-08 DIAGNOSIS — I129 Hypertensive chronic kidney disease with stage 1 through stage 4 chronic kidney disease, or unspecified chronic kidney disease: Secondary | ICD-10-CM | POA: Diagnosis not present

## 2013-10-08 DIAGNOSIS — L89109 Pressure ulcer of unspecified part of back, unspecified stage: Secondary | ICD-10-CM | POA: Diagnosis not present

## 2013-10-08 DIAGNOSIS — E119 Type 2 diabetes mellitus without complications: Secondary | ICD-10-CM | POA: Diagnosis not present

## 2013-10-08 DIAGNOSIS — N183 Chronic kidney disease, stage 3 unspecified: Secondary | ICD-10-CM | POA: Diagnosis not present

## 2013-10-09 ENCOUNTER — Encounter (INDEPENDENT_AMBULATORY_CARE_PROVIDER_SITE_OTHER): Payer: Self-pay | Admitting: General Surgery

## 2013-10-09 ENCOUNTER — Ambulatory Visit (INDEPENDENT_AMBULATORY_CARE_PROVIDER_SITE_OTHER): Payer: Medicare Other | Admitting: General Surgery

## 2013-10-09 VITALS — BP 142/70 | HR 73 | Temp 97.2°F | Ht 62.0 in | Wt 185.0 lb

## 2013-10-09 DIAGNOSIS — K603 Anal fistula, unspecified: Secondary | ICD-10-CM

## 2013-10-09 DIAGNOSIS — L89109 Pressure ulcer of unspecified part of back, unspecified stage: Secondary | ICD-10-CM

## 2013-10-09 NOTE — Progress Notes (Signed)
Patient ID: [name], female   DOB: 19-Mar-1946, 68 y.o.   MRN: [id number]  Chief Complaint  Patient presents with  . anal fistula    HPI [name] is a 68 y.o. female.   HPI  She is referred by Dr. Jerline Pain over at the wound center for possible anal fistula. She has had a decubitus ulcer in the sacral area that is slowly healing. By her report, Dr. Jerline Pain was concerned that there was connection between the anus and the wound. There is no feculent drainage from the sacral decubitus wound.  Past Medical History  Diagnosis Date  . Hypertension   . Hyperlipidemia   . GERD (gastroesophageal reflux disease)   . GAD (generalized anxiety disorder)   . GIB (gastrointestinal bleeding)   . CKD (chronic kidney disease), stage III   . Peripheral edema   . Anemia   . Colon polyps   . Coronary artery disease   . Hypothyroidism   . Type II diabetes mellitus   . History of blood transfusion 10/11/2012    "2 units yesterday; first time ever" (10/12/2012)  . Gout   . Anxiety   . Arthritis     knees and hand/fingers    Past Surgical History  Procedure Laterality Date  . Incision and drainage perirectal abscess  ~ 2007  . Retinopathy surgery Bilateral 1980's?  . Orif femur fracture Left 10/09/2012    Procedure: OPEN REDUCTION INTERNAL FIXATION (ORIF) DISTAL FEMUR FRACTURE;  Surgeon: Rozanna Box, MD;  Location: [facility];  Service: Orthopedics;  Laterality: Left;    Family History  Problem Relation Age of Onset  . Colon cancer Father   . Prostate cancer Father   . Diabetes Father   . Coronary artery disease Mother 58  . Heart disease Mother   . Diabetes Mother   . Coronary artery disease Sister 98  . Diabetes Sister   . Heart disease Sister   . Diabetes Maternal Grandmother   . Diabetes Sister   . Diabetes Sister   . Diabetes Sister   . Diabetes Sister   . Diabetes Sister     Social History History  Substance Use Topics  . Smoking status: Never Smoker   . Smokeless  tobacco: Never Used  . Alcohol Use: No    Allergies  Allergen Reactions  . Aspirin     vomiting  . Ciprofloxacin     unknown  . Codeine     Makes me sick   . Micardis [Telmisartan]     unknown  . Rofecoxib     unknown    Current Outpatient Prescriptions  Medication Sig Dispense Refill  . atorvastatin (LIPITOR) 40 MG tablet Take 1 tablet (40 mg total) by mouth daily.  90 tablet  1  . calcitRIOL (ROCALTROL) 0.25 MCG capsule Take 1 capsule (0.25 mcg total) by mouth daily.  90 capsule  1  . cloNIDine (CATAPRES) 0.2 MG tablet Take 1 tablet (0.2 mg total) by mouth 2 (two) times daily.  90 tablet  1  . darbepoetin (ARANESP) 100 MCG/0.5ML SOLN injection Inject 100 mcg into the skin every 5 (five) weeks.      . furosemide (LASIX) 40 MG tablet Take 1 tablet (40 mg total) by mouth daily.  90 tablet  1  . glimepiride (AMARYL) 4 MG tablet Take 1 tablet (4 mg total) by mouth 2 (two) times daily.  180 tablet  1  . Glucose Blood DISK Use to check BG up to  twice a day.  Dx:  250.02  200 each  2  . levothyroxine (SYNTHROID, LEVOTHROID) 75 MCG tablet Take 1 tablet (75 mcg total) by mouth daily before breakfast.  90 tablet  1  . losartan (COZAAR) 100 MG tablet Take 1 tablet (100 mg total) by mouth daily.  90 tablet  1  . metoCLOPramide (REGLAN) 5 MG tablet Take 1 tablet (5 mg total) by mouth 4 (four) times daily.  360 tablet  1  . metoprolol succinate (TOPROL-XL) 50 MG 24 hr tablet Take 1 tablet (50 mg total) by mouth daily.  90 tablet  1  . pantoprazole (PROTONIX) 40 MG tablet Take 1 tablet (40 mg total) by mouth 2 (two) times daily.  180 tablet  1  . pioglitazone (ACTOS) 30 MG tablet Take 1 tablet (30 mg total) by mouth daily.  90 tablet  1  . saxagliptin HCl (ONGLYZA) 2.5 MG TABS tablet Take 1 tablet (2.5 mg total) by mouth daily.  28 tablet  0  . sodium polystyrene (SPS) 15 GM/60ML suspension Take 60 mLs (15 g total) by mouth once.  500 mL  1  . Soft Lens Products (SALINE) SOLN 1 application by  Does not apply route daily.  1 Bottle  0   No current facility-administered medications for this visit.    Review of Systems Review of Systems  Constitutional: Negative for fever.  Gastrointestinal: Negative for blood in stool.    Blood pressure 142/70, pulse 73, temperature 97.2 F (36.2 C), height 5\' 2"  (1.575 m), weight 185 lb (83.915 kg).  Physical Exam Physical Exam  Constitutional: She appears well-developed and well-nourished. No distress.  Genitourinary:  In the posterior inferior gluteal cleft area is a wound that measured approximately 2 cm in length by 1 cm in width by 1 cm in depth. There is a 1 cm tract inferiorly. On performing a digital rectal exam and placing a small probe in the wound there is no fistulous connection demonstrated. Anoscopy was performed. An inflamed internal hemorrhoid was noted in the right posterior position. Peroxide was then injected into the wound and there is no evidence of peroxide coming through a defect in the anus.    Data Reviewed Old progress note.  Assessment    Very slowly healing sacral decubitus wound. The wound is half the size it was 6 months ago. I cannot demonstrate any evidence of a fistula in ano     Plan    Continue current wound care. Followup prn.        Rhunette Croft Vidyuth Belsito 10/09/2013, 2:43 PM

## 2013-10-09 NOTE — Patient Instructions (Signed)
Continue current wound care

## 2013-10-12 DIAGNOSIS — I509 Heart failure, unspecified: Secondary | ICD-10-CM | POA: Diagnosis not present

## 2013-10-12 DIAGNOSIS — N183 Chronic kidney disease, stage 3 unspecified: Secondary | ICD-10-CM | POA: Diagnosis not present

## 2013-10-12 DIAGNOSIS — E119 Type 2 diabetes mellitus without complications: Secondary | ICD-10-CM | POA: Diagnosis not present

## 2013-10-12 DIAGNOSIS — I129 Hypertensive chronic kidney disease with stage 1 through stage 4 chronic kidney disease, or unspecified chronic kidney disease: Secondary | ICD-10-CM | POA: Diagnosis not present

## 2013-10-12 DIAGNOSIS — L89109 Pressure ulcer of unspecified part of back, unspecified stage: Secondary | ICD-10-CM | POA: Diagnosis not present

## 2013-10-15 DIAGNOSIS — I509 Heart failure, unspecified: Secondary | ICD-10-CM | POA: Diagnosis not present

## 2013-10-15 DIAGNOSIS — E119 Type 2 diabetes mellitus without complications: Secondary | ICD-10-CM | POA: Diagnosis not present

## 2013-10-15 DIAGNOSIS — I129 Hypertensive chronic kidney disease with stage 1 through stage 4 chronic kidney disease, or unspecified chronic kidney disease: Secondary | ICD-10-CM | POA: Diagnosis not present

## 2013-10-15 DIAGNOSIS — L89109 Pressure ulcer of unspecified part of back, unspecified stage: Secondary | ICD-10-CM | POA: Diagnosis not present

## 2013-10-15 DIAGNOSIS — N183 Chronic kidney disease, stage 3 unspecified: Secondary | ICD-10-CM | POA: Diagnosis not present

## 2013-10-16 ENCOUNTER — Encounter (HOSPITAL_BASED_OUTPATIENT_CLINIC_OR_DEPARTMENT_OTHER): Payer: Medicare Other | Attending: General Surgery

## 2013-10-17 ENCOUNTER — Ambulatory Visit: Payer: Medicare Other | Admitting: Nurse Practitioner

## 2013-10-18 DIAGNOSIS — I129 Hypertensive chronic kidney disease with stage 1 through stage 4 chronic kidney disease, or unspecified chronic kidney disease: Secondary | ICD-10-CM | POA: Diagnosis not present

## 2013-10-18 DIAGNOSIS — E119 Type 2 diabetes mellitus without complications: Secondary | ICD-10-CM | POA: Diagnosis not present

## 2013-10-18 DIAGNOSIS — I509 Heart failure, unspecified: Secondary | ICD-10-CM | POA: Diagnosis not present

## 2013-10-18 DIAGNOSIS — L89109 Pressure ulcer of unspecified part of back, unspecified stage: Secondary | ICD-10-CM | POA: Diagnosis not present

## 2013-10-18 DIAGNOSIS — N183 Chronic kidney disease, stage 3 unspecified: Secondary | ICD-10-CM | POA: Diagnosis not present

## 2013-10-20 DIAGNOSIS — I509 Heart failure, unspecified: Secondary | ICD-10-CM | POA: Diagnosis not present

## 2013-10-20 DIAGNOSIS — N183 Chronic kidney disease, stage 3 unspecified: Secondary | ICD-10-CM | POA: Diagnosis not present

## 2013-10-20 DIAGNOSIS — E119 Type 2 diabetes mellitus without complications: Secondary | ICD-10-CM | POA: Diagnosis not present

## 2013-10-20 DIAGNOSIS — I129 Hypertensive chronic kidney disease with stage 1 through stage 4 chronic kidney disease, or unspecified chronic kidney disease: Secondary | ICD-10-CM | POA: Diagnosis not present

## 2013-10-20 DIAGNOSIS — L89109 Pressure ulcer of unspecified part of back, unspecified stage: Secondary | ICD-10-CM | POA: Diagnosis not present

## 2013-10-21 DIAGNOSIS — L8993 Pressure ulcer of unspecified site, stage 3: Secondary | ICD-10-CM | POA: Diagnosis not present

## 2013-10-21 DIAGNOSIS — L89109 Pressure ulcer of unspecified part of back, unspecified stage: Secondary | ICD-10-CM

## 2013-10-21 DIAGNOSIS — N183 Chronic kidney disease, stage 3 unspecified: Secondary | ICD-10-CM | POA: Diagnosis not present

## 2013-10-21 DIAGNOSIS — I129 Hypertensive chronic kidney disease with stage 1 through stage 4 chronic kidney disease, or unspecified chronic kidney disease: Secondary | ICD-10-CM | POA: Diagnosis not present

## 2013-10-21 DIAGNOSIS — I509 Heart failure, unspecified: Secondary | ICD-10-CM

## 2013-10-22 ENCOUNTER — Encounter: Payer: Self-pay | Admitting: Nurse Practitioner

## 2013-10-22 ENCOUNTER — Ambulatory Visit (INDEPENDENT_AMBULATORY_CARE_PROVIDER_SITE_OTHER): Payer: Medicare Other | Admitting: Nurse Practitioner

## 2013-10-22 VITALS — BP 219/86 | HR 72 | Temp 98.5°F | Ht 62.0 in | Wt 187.0 lb

## 2013-10-22 DIAGNOSIS — I10 Essential (primary) hypertension: Secondary | ICD-10-CM | POA: Diagnosis not present

## 2013-10-22 DIAGNOSIS — E1165 Type 2 diabetes mellitus with hyperglycemia: Secondary | ICD-10-CM

## 2013-10-22 DIAGNOSIS — IMO0001 Reserved for inherently not codable concepts without codable children: Secondary | ICD-10-CM | POA: Diagnosis not present

## 2013-10-22 DIAGNOSIS — I509 Heart failure, unspecified: Secondary | ICD-10-CM | POA: Diagnosis not present

## 2013-10-22 DIAGNOSIS — N183 Chronic kidney disease, stage 3 unspecified: Secondary | ICD-10-CM | POA: Diagnosis not present

## 2013-10-22 DIAGNOSIS — E119 Type 2 diabetes mellitus without complications: Secondary | ICD-10-CM

## 2013-10-22 DIAGNOSIS — F329 Major depressive disorder, single episode, unspecified: Secondary | ICD-10-CM

## 2013-10-22 DIAGNOSIS — N259 Disorder resulting from impaired renal tubular function, unspecified: Secondary | ICD-10-CM

## 2013-10-22 DIAGNOSIS — E785 Hyperlipidemia, unspecified: Secondary | ICD-10-CM

## 2013-10-22 DIAGNOSIS — K219 Gastro-esophageal reflux disease without esophagitis: Secondary | ICD-10-CM

## 2013-10-22 DIAGNOSIS — E669 Obesity, unspecified: Secondary | ICD-10-CM

## 2013-10-22 DIAGNOSIS — I129 Hypertensive chronic kidney disease with stage 1 through stage 4 chronic kidney disease, or unspecified chronic kidney disease: Secondary | ICD-10-CM | POA: Diagnosis not present

## 2013-10-22 DIAGNOSIS — F3289 Other specified depressive episodes: Secondary | ICD-10-CM

## 2013-10-22 DIAGNOSIS — L89109 Pressure ulcer of unspecified part of back, unspecified stage: Secondary | ICD-10-CM | POA: Diagnosis not present

## 2013-10-22 DIAGNOSIS — F411 Generalized anxiety disorder: Secondary | ICD-10-CM

## 2013-10-22 DIAGNOSIS — E039 Hypothyroidism, unspecified: Secondary | ICD-10-CM

## 2013-10-22 LAB — POCT GLYCOSYLATED HEMOGLOBIN (HGB A1C): Hemoglobin A1C: 7.5

## 2013-10-22 MED ORDER — LOSARTAN POTASSIUM 100 MG PO TABS: 100.0000 mg | ORAL_TABLET | Freq: Every day | ORAL | Status: DC

## 2013-10-22 MED ORDER — FUROSEMIDE 40 MG PO TABS: 40.0000 mg | ORAL_TABLET | Freq: Every day | ORAL | Status: DC

## 2013-10-22 MED ORDER — PIOGLITAZONE HCL 30 MG PO TABS: 30.0000 mg | ORAL_TABLET | Freq: Every day | ORAL | Status: DC

## 2013-10-22 MED ORDER — LEVOTHYROXINE SODIUM 75 MCG PO TABS: 75.0000 ug | ORAL_TABLET | Freq: Every day | ORAL | Status: DC

## 2013-10-22 MED ORDER — CLONIDINE HCL 0.2 MG PO TABS: 0.2000 mg | ORAL_TABLET | Freq: Two times a day (BID) | ORAL | Status: DC

## 2013-10-22 MED ORDER — GLIMEPIRIDE 4 MG PO TABS: 4.0000 mg | ORAL_TABLET | Freq: Two times a day (BID) | ORAL | Status: DC

## 2013-10-22 MED ORDER — PANTOPRAZOLE SODIUM 40 MG PO TBEC: 40.0000 mg | DELAYED_RELEASE_TABLET | Freq: Two times a day (BID) | ORAL | Status: DC

## 2013-10-22 MED ORDER — ATORVASTATIN CALCIUM 40 MG PO TABS: 40.0000 mg | ORAL_TABLET | Freq: Every day | ORAL | Status: DC

## 2013-10-22 MED ORDER — METOPROLOL SUCCINATE ER 50 MG PO TB24: 50.0000 mg | ORAL_TABLET | Freq: Every day | ORAL | Status: DC

## 2013-10-22 NOTE — Patient Instructions (Signed)
Diabetes and Foot Care Diabetes may cause you to have problems because of poor blood supply (circulation) to your feet and legs. This may cause the skin on your feet to become thinner, break easier, and heal more slowly. Your skin may become dry, and the skin may peel and crack. You may also have nerve damage in your legs and feet causing decreased feeling in them. You may not notice minor injuries to your feet that could lead to infections or more serious problems. Taking care of your feet is one of the most important things you can do for yourself.  HOME CARE INSTRUCTIONS  Wear shoes at all times, even in the house. Do not go barefoot. Bare feet are easily injured.  Check your feet daily for blisters, cuts, and redness. If you cannot see the bottom of your feet, use a mirror or ask someone for help.  Wash your feet with warm water (do not use hot water) and mild soap. Then pat your feet and the areas between your toes until they are completely dry. Do not soak your feet as this can dry your skin.  Apply a moisturizing lotion or petroleum jelly (that does not contain alcohol and is unscented) to the skin on your feet and to dry, brittle toenails. Do not apply lotion between your toes.  Trim your toenails straight across. Do not dig under them or around the cuticle. File the edges of your nails with an emery board or nail file.  Do not cut corns or calluses or try to remove them with medicine.  Wear clean socks or stockings every day. Make sure they are not too tight. Do not wear knee-high stockings since they may decrease blood flow to your legs.  Wear shoes that fit properly and have enough cushioning. To break in new shoes, wear them for just a few hours a day. This prevents you from injuring your feet. Always look in your shoes before you put them on to be sure there are no objects inside.  Do not cross your legs. This may decrease the blood flow to your feet.  If you find a minor scrape,  cut, or break in the skin on your feet, keep it and the skin around it clean and dry. These areas may be cleansed with mild soap and water. Do not cleanse the area with peroxide, alcohol, or iodine.  When you remove an adhesive bandage, be sure not to damage the skin around it.  If you have a wound, look at it several times a day to make sure it is healing.  Do not use heating pads or hot water bottles. They may burn your skin. If you have lost feeling in your feet or legs, you may not know it is happening until it is too late.  Make sure your health care provider performs a complete foot exam at least annually or more often if you have foot problems. Report any cuts, sores, or bruises to your health care provider immediately. SEEK MEDICAL CARE IF:   You have an injury that is not healing.  You have cuts or breaks in the skin.  You have an ingrown nail.  You notice redness on your legs or feet.  You feel burning or tingling in your legs or feet.  You have pain or cramps in your legs and feet.  Your legs or feet are numb.  Your feet always feel cold. SEEK IMMEDIATE MEDICAL CARE IF:   There is increasing redness,   swelling, or pain in or around a wound.  There is a red line that goes up your leg.  Pus is coming from a wound.  You develop a fever or as directed by your health care provider.  You notice a bad smell coming from an ulcer or wound. Document Released: 05/07/2000 Document Revised: 01/10/2013 Document Reviewed: 10/17/2012 [employer] Patient Information 2014 [employer].  

## 2013-10-22 NOTE — Progress Notes (Signed)
Subjective:    Patient ID: [name], female    DOB: 08-10-1945, 68 y.o.   MRN: [id number]  Patient here today for follow up- she is doing well without complaints today. She was in the nursing home for rehab from right femur fracture and got a decubitus ulcer on buttocks and is currently going to the wound center for treatment.  Diabetes She presents for her follow-up diabetic visit. She has type 2 diabetes mellitus. There are no hypoglycemic associated symptoms. Pertinent negatives for hypoglycemia include no headaches. Associated symptoms include visual change. Pertinent negatives for diabetes include no blurred vision, no chest pain, no fatigue, no foot paresthesias, no foot ulcerations, no polydipsia and no polyphagia. There are no hypoglycemic complications. There are no diabetic complications. Risk factors for coronary artery disease include diabetes mellitus, dyslipidemia, hypertension and post-menopausal. Current diabetic treatment includes oral agent (monotherapy). She is compliant with treatment all of the time. Her weight is stable. She is following a diabetic diet. She has not had a previous visit with a dietician. She participates in exercise daily. Her breakfast blood glucose is taken between 8-9 am. Her breakfast blood glucose range is generally 90-110 mg/dl. An ACE inhibitor/angiotensin II receptor blocker is not being taken. She does not see a podiatrist.Eye exam is current (March 2014).  Hyperlipidemia This is a chronic problem. The current episode started more than 1 year ago. The problem is uncontrolled. Recent lipid tests were reviewed and are high. Exacerbating diseases include chronic renal disease, diabetes and hypothyroidism. Factors aggravating her hyperlipidemia include beta blockers. Pertinent negatives include no chest pain, leg pain, myalgias or shortness of breath. Current antihyperlipidemic treatment includes statins. The current treatment provides moderate improvement  of lipids. Risk factors for coronary artery disease include diabetes mellitus, dyslipidemia, family history, hypertension, obesity and post-menopausal.  Hypertension This is a chronic problem. The current episode started more than 1 year ago. The problem has been waxing and waning since onset. The problem is uncontrolled. Pertinent negatives include no anxiety, blurred vision, chest pain, headaches, palpitations, peripheral edema or shortness of breath. Risk factors for coronary artery disease include diabetes mellitus, dyslipidemia, family history and post-menopausal state. Past treatments include beta blockers. The current treatment provides mild improvement. Hypertensive end-organ damage includes kidney disease and a thyroid problem. Identifiable causes of hypertension include chronic renal disease.  Gastrophageal Reflux She reports no chest pain, no coughing, no heartburn, no nausea or no sore throat. This is a chronic problem. The current episode started more than 1 year ago. The problem occurs rarely. The problem has been resolved. The symptoms are aggravated by certain foods. Pertinent negatives include no fatigue or muscle weakness. She has tried a PPI for the symptoms. The treatment provided significant relief.  Thyroid Problem Presents for follow-up visit. Symptoms include dry skin, hair loss and visual change. Patient reports no constipation, depressed mood, diaphoresis, fatigue, leg swelling or palpitations. The symptoms have been stable. Past treatments include levothyroxine. The treatment provided significant relief. Her past medical history is significant for diabetes and hyperlipidemia.   *Review of Systems  Constitutional: Negative for diaphoresis and fatigue.  HENT: Negative for sore throat.   Eyes: Negative for blurred vision.  Respiratory: Negative for cough and shortness of breath.   Cardiovascular: Negative for chest pain and palpitations.  Gastrointestinal: Negative for  heartburn, nausea and constipation.  Endocrine: Negative for polydipsia and polyphagia.  Musculoskeletal: Negative for muscle weakness and myalgias.  Neurological: Negative for headaches.  All other systems reviewed and  are negative.      Objective:   Physical Exam  Vitals reviewed. Constitutional: She is oriented to person, place, and time. She appears well-developed and well-nourished.  HENT:  Head: Normocephalic.  Right Ear: External ear normal.  Mouth/Throat: Oropharynx is clear and moist.  Eyes: Pupils are equal, round, and reactive to light.  Neck: Normal range of motion. Neck supple. No thyromegaly present.  Cardiovascular: Normal rate, regular rhythm, normal heart sounds and intact distal pulses.   Pulmonary/Chest: Effort normal and breath sounds normal.  Abdominal: Soft. Bowel sounds are normal. She exhibits no distension. There is no tenderness.  Musculoskeletal: Normal range of motion. She exhibits edema (trace amt bil ankles).  Pt broke femur May 2014   Neurological: She is alert and oriented to person, place, and time.  Skin: Skin is warm and dry.  Psychiatric: She has a normal mood and affect. Her behavior is normal. Judgment and thought content normal.  diabetic foot exam within normal limits- (+) monofilament bilaterally   BP 219/86  Pulse 72  Temp(Src) 98.5 F (36.9 C) (Oral)  Ht _0  (1.575 m)  Wt 187 lb (84.823 kg)  BMI 34.19 kg/m2   Results for orders placed in visit on 10/22/13  POCT GLYCOSYLATED HEMOGLOBIN (HGB A1C)      Result Value Ref Range   Hemoglobin A1C 7.5%         Assessment & Plan:    1. HYPERLIPIDEMIA   2. HYPERTENSION   3. RENAL INSUFFICIENCY   4. Type II or unspecified type diabetes mellitus without mention of complication, uncontrolled   5. Obesity   6. DEPRESSION   7. ANXIETY   8. Hypothyroidism   9. GERD (gastroesophageal reflux disease)    Orders Placed This Encounter  Procedures  . CMP14+EGFR  . NMR, lipoprofile   . POCT glycosylated hemoglobin (Hb A1C)   Meds ordered this encounter  Medications  . atorvastatin (LIPITOR) 40 MG tablet    Sig: Take 1 tablet (40 mg total) by mouth daily.    Dispense:  90 tablet    Refill:  1    Order Specific Question:  Supervising Provider    Answer:  Chipper Herb [[id number]]  . cloNIDine (CATAPRES) 0.2 MG tablet    Sig: Take 1 tablet (0.2 mg total) by mouth 2 (two) times daily.    Dispense:  90 tablet    Refill:  1    Order Specific Question:  Supervising Provider    Answer:  Chipper Herb [[id number]]  . furosemide (LASIX) 40 MG tablet    Sig: Take 1 tablet (40 mg total) by mouth daily.    Dispense:  90 tablet    Refill:  1    Order Specific Question:  Supervising Provider    Answer:  Chipper Herb [[id number]]  . glimepiride (AMARYL) 4 MG tablet    Sig: Take 1 tablet (4 mg total) by mouth 2 (two) times daily.    Dispense:  180 tablet    Refill:  1    Order Specific Question:  Supervising Provider    Answer:  Chipper Herb [[id number]]  . levothyroxine (SYNTHROID, LEVOTHROID) 75 MCG tablet    Sig: Take 1 tablet (75 mcg total) by mouth daily before breakfast.    Dispense:  90 tablet    Refill:  1    Order Specific Question:  Supervising Provider    Answer:  Chipper Herb [[id number]]  . losartan (COZAAR) 100 MG tablet  Sig: Take 1 tablet (100 mg total) by mouth daily.    Dispense:  90 tablet    Refill:  1    Order Specific Question:  Supervising Provider    Answer:  Chipper Herb [[id number]]  . metoprolol succinate (TOPROL-XL) 50 MG 24 hr tablet    Sig: Take 1 tablet (50 mg total) by mouth daily.    Dispense:  90 tablet    Refill:  1    Order Specific Question:  Supervising Provider    Answer:  Chipper Herb [[id number]]  . pantoprazole (PROTONIX) 40 MG tablet    Sig: Take 1 tablet (40 mg total) by mouth 2 (two) times daily.    Dispense:  180 tablet    Refill:  1    Order Specific Question:  Supervising Provider    Answer:  Chipper Herb [[id number]]  . pioglitazone  (ACTOS) 30 MG tablet    Sig: Take 1 tablet (30 mg total) by mouth daily.    Dispense:  90 tablet    Refill:  1    Order Specific Question:  Supervising Provider    Answer:  Chipper Herb [[id number]]    Labs pending Health maintenance reviewed Diet and exercise encouraged Continue all meds Follow up  In 3 months    [place], FNP

## 2013-10-23 LAB — CMP14+EGFR
ALBUMIN: 4 g/dL (ref 3.6–4.8)
ALK PHOS: 111 IU/L (ref 39–117)
ALT: 5 IU/L (ref 0–32)
AST: 10 IU/L (ref 0–40)
Albumin/Globulin Ratio: 1.5 (ref 1.1–2.5)
BUN / CREAT RATIO: 26 (ref 11–26)
BUN: 40 mg/dL — AB (ref 8–27)
CHLORIDE: 104 mmol/L (ref 97–108)
CO2: 23 mmol/L (ref 18–29)
Calcium: 9.3 mg/dL (ref 8.7–10.3)
Creatinine, Ser: 1.54 mg/dL — ABNORMAL HIGH (ref 0.57–1.00)
GFR calc non Af Amer: 34 mL/min/{1.73_m2} — ABNORMAL LOW (ref >59)
GFR, EST AFRICAN AMERICAN: 40 mL/min/{1.73_m2} — AB (ref >59)
Globulin, Total: 2.6 g/dL (ref 1.5–4.5)
Glucose: 226 mg/dL — ABNORMAL HIGH (ref 65–99)
Potassium: 5.1 mmol/L (ref 3.5–5.2)
SODIUM: 142 mmol/L (ref 134–144)
Total Bilirubin: 0.5 mg/dL (ref 0.0–1.2)
Total Protein: 6.6 g/dL (ref 6.0–8.5)

## 2013-10-23 LAB — NMR, LIPOPROFILE
CHOLESTEROL: 117 mg/dL (ref 100–199)
HDL Cholesterol by NMR: 37 mg/dL — ABNORMAL LOW (ref >39)
HDL Particle Number: 23.7 umol/L — ABNORMAL LOW (ref >=30.5)
LDL PARTICLE NUMBER: 761 nmol/L (ref <1000)
LDL SIZE: 20.8 nm (ref >20.5)
LDLC SERPL CALC-MCNC: 60 mg/dL (ref 0–99)
LP-IR Score: 44 (ref <=45)
Small LDL Particle Number: 247 nmol/L (ref <=527)
Triglycerides by NMR: 101 mg/dL (ref 0–149)

## 2013-10-24 DIAGNOSIS — E119 Type 2 diabetes mellitus without complications: Secondary | ICD-10-CM | POA: Diagnosis not present

## 2013-10-24 DIAGNOSIS — N183 Chronic kidney disease, stage 3 unspecified: Secondary | ICD-10-CM | POA: Diagnosis not present

## 2013-10-24 DIAGNOSIS — L89109 Pressure ulcer of unspecified part of back, unspecified stage: Secondary | ICD-10-CM | POA: Diagnosis not present

## 2013-10-24 DIAGNOSIS — I509 Heart failure, unspecified: Secondary | ICD-10-CM | POA: Diagnosis not present

## 2013-10-24 DIAGNOSIS — I129 Hypertensive chronic kidney disease with stage 1 through stage 4 chronic kidney disease, or unspecified chronic kidney disease: Secondary | ICD-10-CM | POA: Diagnosis not present

## 2013-10-26 DIAGNOSIS — E119 Type 2 diabetes mellitus without complications: Secondary | ICD-10-CM | POA: Diagnosis not present

## 2013-10-26 DIAGNOSIS — L89109 Pressure ulcer of unspecified part of back, unspecified stage: Secondary | ICD-10-CM | POA: Diagnosis not present

## 2013-10-26 DIAGNOSIS — I509 Heart failure, unspecified: Secondary | ICD-10-CM | POA: Diagnosis not present

## 2013-10-26 DIAGNOSIS — I129 Hypertensive chronic kidney disease with stage 1 through stage 4 chronic kidney disease, or unspecified chronic kidney disease: Secondary | ICD-10-CM | POA: Diagnosis not present

## 2013-10-26 DIAGNOSIS — N183 Chronic kidney disease, stage 3 unspecified: Secondary | ICD-10-CM | POA: Diagnosis not present

## 2013-10-29 DIAGNOSIS — L89109 Pressure ulcer of unspecified part of back, unspecified stage: Secondary | ICD-10-CM | POA: Diagnosis not present

## 2013-10-29 DIAGNOSIS — N183 Chronic kidney disease, stage 3 unspecified: Secondary | ICD-10-CM | POA: Diagnosis not present

## 2013-10-29 DIAGNOSIS — I509 Heart failure, unspecified: Secondary | ICD-10-CM | POA: Diagnosis not present

## 2013-10-29 DIAGNOSIS — E119 Type 2 diabetes mellitus without complications: Secondary | ICD-10-CM | POA: Diagnosis not present

## 2013-10-29 DIAGNOSIS — I129 Hypertensive chronic kidney disease with stage 1 through stage 4 chronic kidney disease, or unspecified chronic kidney disease: Secondary | ICD-10-CM | POA: Diagnosis not present

## 2013-10-30 ENCOUNTER — Encounter (HOSPITAL_COMMUNITY)
Admission: RE | Admit: 2013-10-30 | Discharge: 2013-10-30 | Disposition: A | Discharge status: Home / Self Care | Payer: Medicare Other | Source: Ambulatory Visit | Attending: Nephrology | Admitting: Nephrology

## 2013-10-30 ENCOUNTER — Encounter (HOSPITAL_BASED_OUTPATIENT_CLINIC_OR_DEPARTMENT_OTHER): Payer: Medicare Other | Attending: General Surgery

## 2013-10-30 DIAGNOSIS — T8189XA Other complications of procedures, not elsewhere classified, initial encounter: Secondary | ICD-10-CM | POA: Diagnosis not present

## 2013-10-30 DIAGNOSIS — N183 Chronic kidney disease, stage 3 unspecified: Secondary | ICD-10-CM | POA: Insufficient documentation

## 2013-10-30 DIAGNOSIS — D638 Anemia in other chronic diseases classified elsewhere: Secondary | ICD-10-CM | POA: Diagnosis not present

## 2013-10-30 DIAGNOSIS — Y839 Surgical procedure, unspecified as the cause of abnormal reaction of the patient, or of later complication, without mention of misadventure at the time of the procedure: Secondary | ICD-10-CM | POA: Insufficient documentation

## 2013-10-30 LAB — IRON AND TIBC
Iron: 91 ug/dL (ref 42–135)
Saturation Ratios: 36 % (ref 20–55)
TIBC: 256 ug/dL (ref 250–470)
UIBC: 165 ug/dL (ref 125–400)

## 2013-10-30 LAB — POCT HEMOGLOBIN-HEMACUE: Hemoglobin: 11.2 g/dL — ABNORMAL LOW (ref 12.0–15.0)

## 2013-10-30 LAB — FERRITIN: Ferritin: 471 ng/mL — ABNORMAL HIGH (ref 10–291)

## 2013-10-30 MED ORDER — DARBEPOETIN ALFA-POLYSORBATE 100 MCG/0.5ML IJ SOLN
100.0000 ug | INTRAMUSCULAR | Status: DC
  Administered 2013-10-30: 100 ug via SUBCUTANEOUS

## 2013-10-30 MED ORDER — DARBEPOETIN ALFA-POLYSORBATE 100 MCG/0.5ML IJ SOLN
INTRAMUSCULAR | Status: AC
Start: 2013-10-30 — End: 2013-10-30
  Filled 2013-10-30: qty 0.5

## 2013-10-31 DIAGNOSIS — I129 Hypertensive chronic kidney disease with stage 1 through stage 4 chronic kidney disease, or unspecified chronic kidney disease: Secondary | ICD-10-CM | POA: Diagnosis not present

## 2013-10-31 DIAGNOSIS — I509 Heart failure, unspecified: Secondary | ICD-10-CM | POA: Diagnosis not present

## 2013-10-31 DIAGNOSIS — E119 Type 2 diabetes mellitus without complications: Secondary | ICD-10-CM | POA: Diagnosis not present

## 2013-10-31 DIAGNOSIS — N183 Chronic kidney disease, stage 3 unspecified: Secondary | ICD-10-CM | POA: Diagnosis not present

## 2013-10-31 DIAGNOSIS — L89109 Pressure ulcer of unspecified part of back, unspecified stage: Secondary | ICD-10-CM | POA: Diagnosis not present

## 2013-11-02 DIAGNOSIS — E119 Type 2 diabetes mellitus without complications: Secondary | ICD-10-CM | POA: Diagnosis not present

## 2013-11-02 DIAGNOSIS — I509 Heart failure, unspecified: Secondary | ICD-10-CM | POA: Diagnosis not present

## 2013-11-02 DIAGNOSIS — L89109 Pressure ulcer of unspecified part of back, unspecified stage: Secondary | ICD-10-CM | POA: Diagnosis not present

## 2013-11-02 DIAGNOSIS — N183 Chronic kidney disease, stage 3 unspecified: Secondary | ICD-10-CM | POA: Diagnosis not present

## 2013-11-02 DIAGNOSIS — I129 Hypertensive chronic kidney disease with stage 1 through stage 4 chronic kidney disease, or unspecified chronic kidney disease: Secondary | ICD-10-CM | POA: Diagnosis not present

## 2013-11-05 DIAGNOSIS — L89109 Pressure ulcer of unspecified part of back, unspecified stage: Secondary | ICD-10-CM | POA: Diagnosis not present

## 2013-11-05 DIAGNOSIS — I129 Hypertensive chronic kidney disease with stage 1 through stage 4 chronic kidney disease, or unspecified chronic kidney disease: Secondary | ICD-10-CM | POA: Diagnosis not present

## 2013-11-05 DIAGNOSIS — N183 Chronic kidney disease, stage 3 unspecified: Secondary | ICD-10-CM | POA: Diagnosis not present

## 2013-11-05 DIAGNOSIS — I509 Heart failure, unspecified: Secondary | ICD-10-CM | POA: Diagnosis not present

## 2013-11-05 DIAGNOSIS — E119 Type 2 diabetes mellitus without complications: Secondary | ICD-10-CM | POA: Diagnosis not present

## 2013-11-07 DIAGNOSIS — E119 Type 2 diabetes mellitus without complications: Secondary | ICD-10-CM | POA: Diagnosis not present

## 2013-11-07 DIAGNOSIS — I129 Hypertensive chronic kidney disease with stage 1 through stage 4 chronic kidney disease, or unspecified chronic kidney disease: Secondary | ICD-10-CM | POA: Diagnosis not present

## 2013-11-07 DIAGNOSIS — I509 Heart failure, unspecified: Secondary | ICD-10-CM | POA: Diagnosis not present

## 2013-11-07 DIAGNOSIS — N183 Chronic kidney disease, stage 3 unspecified: Secondary | ICD-10-CM | POA: Diagnosis not present

## 2013-11-07 DIAGNOSIS — L89109 Pressure ulcer of unspecified part of back, unspecified stage: Secondary | ICD-10-CM | POA: Diagnosis not present

## 2013-11-09 DIAGNOSIS — E119 Type 2 diabetes mellitus without complications: Secondary | ICD-10-CM | POA: Diagnosis not present

## 2013-11-09 DIAGNOSIS — L89109 Pressure ulcer of unspecified part of back, unspecified stage: Secondary | ICD-10-CM | POA: Diagnosis not present

## 2013-11-09 DIAGNOSIS — N183 Chronic kidney disease, stage 3 unspecified: Secondary | ICD-10-CM | POA: Diagnosis not present

## 2013-11-09 DIAGNOSIS — I509 Heart failure, unspecified: Secondary | ICD-10-CM | POA: Diagnosis not present

## 2013-11-09 DIAGNOSIS — I129 Hypertensive chronic kidney disease with stage 1 through stage 4 chronic kidney disease, or unspecified chronic kidney disease: Secondary | ICD-10-CM | POA: Diagnosis not present

## 2013-11-12 DIAGNOSIS — E119 Type 2 diabetes mellitus without complications: Secondary | ICD-10-CM | POA: Diagnosis not present

## 2013-11-12 DIAGNOSIS — I129 Hypertensive chronic kidney disease with stage 1 through stage 4 chronic kidney disease, or unspecified chronic kidney disease: Secondary | ICD-10-CM | POA: Diagnosis not present

## 2013-11-12 DIAGNOSIS — N183 Chronic kidney disease, stage 3 unspecified: Secondary | ICD-10-CM | POA: Diagnosis not present

## 2013-11-12 DIAGNOSIS — L89109 Pressure ulcer of unspecified part of back, unspecified stage: Secondary | ICD-10-CM | POA: Diagnosis not present

## 2013-11-12 DIAGNOSIS — I509 Heart failure, unspecified: Secondary | ICD-10-CM | POA: Diagnosis not present

## 2013-11-14 DIAGNOSIS — L89109 Pressure ulcer of unspecified part of back, unspecified stage: Secondary | ICD-10-CM | POA: Diagnosis not present

## 2013-11-14 DIAGNOSIS — E119 Type 2 diabetes mellitus without complications: Secondary | ICD-10-CM | POA: Diagnosis not present

## 2013-11-14 DIAGNOSIS — N183 Chronic kidney disease, stage 3 unspecified: Secondary | ICD-10-CM | POA: Diagnosis not present

## 2013-11-14 DIAGNOSIS — I129 Hypertensive chronic kidney disease with stage 1 through stage 4 chronic kidney disease, or unspecified chronic kidney disease: Secondary | ICD-10-CM | POA: Diagnosis not present

## 2013-11-14 DIAGNOSIS — I509 Heart failure, unspecified: Secondary | ICD-10-CM | POA: Diagnosis not present

## 2013-11-16 DIAGNOSIS — I129 Hypertensive chronic kidney disease with stage 1 through stage 4 chronic kidney disease, or unspecified chronic kidney disease: Secondary | ICD-10-CM | POA: Diagnosis not present

## 2013-11-16 DIAGNOSIS — L89109 Pressure ulcer of unspecified part of back, unspecified stage: Secondary | ICD-10-CM | POA: Diagnosis not present

## 2013-11-16 DIAGNOSIS — I509 Heart failure, unspecified: Secondary | ICD-10-CM | POA: Diagnosis not present

## 2013-11-16 DIAGNOSIS — E119 Type 2 diabetes mellitus without complications: Secondary | ICD-10-CM | POA: Diagnosis not present

## 2013-11-16 DIAGNOSIS — N183 Chronic kidney disease, stage 3 unspecified: Secondary | ICD-10-CM | POA: Diagnosis not present

## 2013-11-19 DIAGNOSIS — N183 Chronic kidney disease, stage 3 unspecified: Secondary | ICD-10-CM | POA: Diagnosis not present

## 2013-11-19 DIAGNOSIS — I509 Heart failure, unspecified: Secondary | ICD-10-CM | POA: Diagnosis not present

## 2013-11-19 DIAGNOSIS — I129 Hypertensive chronic kidney disease with stage 1 through stage 4 chronic kidney disease, or unspecified chronic kidney disease: Secondary | ICD-10-CM | POA: Diagnosis not present

## 2013-11-19 DIAGNOSIS — E119 Type 2 diabetes mellitus without complications: Secondary | ICD-10-CM | POA: Diagnosis not present

## 2013-11-19 DIAGNOSIS — L89109 Pressure ulcer of unspecified part of back, unspecified stage: Secondary | ICD-10-CM | POA: Diagnosis not present

## 2013-11-21 DIAGNOSIS — L89109 Pressure ulcer of unspecified part of back, unspecified stage: Secondary | ICD-10-CM | POA: Diagnosis not present

## 2013-11-21 DIAGNOSIS — N183 Chronic kidney disease, stage 3 unspecified: Secondary | ICD-10-CM | POA: Diagnosis not present

## 2013-11-21 DIAGNOSIS — E119 Type 2 diabetes mellitus without complications: Secondary | ICD-10-CM | POA: Diagnosis not present

## 2013-11-21 DIAGNOSIS — I509 Heart failure, unspecified: Secondary | ICD-10-CM | POA: Diagnosis not present

## 2013-11-21 DIAGNOSIS — I129 Hypertensive chronic kidney disease with stage 1 through stage 4 chronic kidney disease, or unspecified chronic kidney disease: Secondary | ICD-10-CM | POA: Diagnosis not present

## 2013-11-23 DIAGNOSIS — E119 Type 2 diabetes mellitus without complications: Secondary | ICD-10-CM | POA: Diagnosis not present

## 2013-11-23 DIAGNOSIS — N183 Chronic kidney disease, stage 3 unspecified: Secondary | ICD-10-CM | POA: Diagnosis not present

## 2013-11-23 DIAGNOSIS — L89109 Pressure ulcer of unspecified part of back, unspecified stage: Secondary | ICD-10-CM | POA: Diagnosis not present

## 2013-11-23 DIAGNOSIS — I509 Heart failure, unspecified: Secondary | ICD-10-CM | POA: Diagnosis not present

## 2013-11-23 DIAGNOSIS — I129 Hypertensive chronic kidney disease with stage 1 through stage 4 chronic kidney disease, or unspecified chronic kidney disease: Secondary | ICD-10-CM | POA: Diagnosis not present

## 2013-11-26 DIAGNOSIS — L89109 Pressure ulcer of unspecified part of back, unspecified stage: Secondary | ICD-10-CM | POA: Diagnosis not present

## 2013-11-26 DIAGNOSIS — N183 Chronic kidney disease, stage 3 unspecified: Secondary | ICD-10-CM | POA: Diagnosis not present

## 2013-11-26 DIAGNOSIS — E119 Type 2 diabetes mellitus without complications: Secondary | ICD-10-CM | POA: Diagnosis not present

## 2013-11-26 DIAGNOSIS — I509 Heart failure, unspecified: Secondary | ICD-10-CM | POA: Diagnosis not present

## 2013-11-26 DIAGNOSIS — I129 Hypertensive chronic kidney disease with stage 1 through stage 4 chronic kidney disease, or unspecified chronic kidney disease: Secondary | ICD-10-CM | POA: Diagnosis not present

## 2013-11-28 DIAGNOSIS — L89109 Pressure ulcer of unspecified part of back, unspecified stage: Secondary | ICD-10-CM | POA: Diagnosis not present

## 2013-11-28 DIAGNOSIS — I509 Heart failure, unspecified: Secondary | ICD-10-CM | POA: Diagnosis not present

## 2013-11-28 DIAGNOSIS — E119 Type 2 diabetes mellitus without complications: Secondary | ICD-10-CM | POA: Diagnosis not present

## 2013-11-28 DIAGNOSIS — I129 Hypertensive chronic kidney disease with stage 1 through stage 4 chronic kidney disease, or unspecified chronic kidney disease: Secondary | ICD-10-CM | POA: Diagnosis not present

## 2013-11-28 DIAGNOSIS — N183 Chronic kidney disease, stage 3 unspecified: Secondary | ICD-10-CM | POA: Diagnosis not present

## 2013-11-30 DIAGNOSIS — E119 Type 2 diabetes mellitus without complications: Secondary | ICD-10-CM | POA: Diagnosis not present

## 2013-11-30 DIAGNOSIS — I509 Heart failure, unspecified: Secondary | ICD-10-CM | POA: Diagnosis not present

## 2013-11-30 DIAGNOSIS — N183 Chronic kidney disease, stage 3 unspecified: Secondary | ICD-10-CM | POA: Diagnosis not present

## 2013-11-30 DIAGNOSIS — L89109 Pressure ulcer of unspecified part of back, unspecified stage: Secondary | ICD-10-CM | POA: Diagnosis not present

## 2013-11-30 DIAGNOSIS — I129 Hypertensive chronic kidney disease with stage 1 through stage 4 chronic kidney disease, or unspecified chronic kidney disease: Secondary | ICD-10-CM | POA: Diagnosis not present

## 2013-12-03 DIAGNOSIS — L89109 Pressure ulcer of unspecified part of back, unspecified stage: Secondary | ICD-10-CM | POA: Diagnosis not present

## 2013-12-03 DIAGNOSIS — N183 Chronic kidney disease, stage 3 unspecified: Secondary | ICD-10-CM | POA: Diagnosis not present

## 2013-12-03 DIAGNOSIS — I129 Hypertensive chronic kidney disease with stage 1 through stage 4 chronic kidney disease, or unspecified chronic kidney disease: Secondary | ICD-10-CM | POA: Diagnosis not present

## 2013-12-03 DIAGNOSIS — I509 Heart failure, unspecified: Secondary | ICD-10-CM | POA: Diagnosis not present

## 2013-12-03 DIAGNOSIS — E119 Type 2 diabetes mellitus without complications: Secondary | ICD-10-CM | POA: Diagnosis not present

## 2013-12-04 ENCOUNTER — Telehealth: Payer: Self-pay | Admitting: *Deleted

## 2013-12-04 ENCOUNTER — Encounter (HOSPITAL_BASED_OUTPATIENT_CLINIC_OR_DEPARTMENT_OTHER): Payer: Medicare Other | Attending: General Surgery

## 2013-12-04 ENCOUNTER — Encounter (HOSPITAL_COMMUNITY)
Admission: RE | Admit: 2013-12-04 | Discharge: 2013-12-04 | Disposition: A | Discharge status: Home / Self Care | Payer: Medicare Other | Source: Ambulatory Visit | Attending: Nephrology | Admitting: Nephrology

## 2013-12-04 DIAGNOSIS — E1165 Type 2 diabetes mellitus with hyperglycemia: Principal | ICD-10-CM

## 2013-12-04 DIAGNOSIS — N183 Chronic kidney disease, stage 3 unspecified: Secondary | ICD-10-CM | POA: Insufficient documentation

## 2013-12-04 DIAGNOSIS — K602 Anal fissure, unspecified: Secondary | ICD-10-CM | POA: Diagnosis not present

## 2013-12-04 DIAGNOSIS — D638 Anemia in other chronic diseases classified elsewhere: Secondary | ICD-10-CM | POA: Insufficient documentation

## 2013-12-04 DIAGNOSIS — IMO0001 Reserved for inherently not codable concepts without codable children: Secondary | ICD-10-CM

## 2013-12-04 LAB — POCT HEMOGLOBIN-HEMACUE: Hemoglobin: 12.4 g/dL (ref 12.0–15.0)

## 2013-12-04 MED ORDER — DARBEPOETIN ALFA-POLYSORBATE 100 MCG/0.5ML IJ SOLN: 100.0000 ug | INTRAMUSCULAR | Status: DC

## 2013-12-04 MED ORDER — GLIMEPIRIDE 4 MG PO TABS: 4.0000 mg | ORAL_TABLET | Freq: Every day | ORAL | Status: DC

## 2013-12-04 NOTE — Telephone Encounter (Signed)
Changed to Glimeperide 4mg  #90 with 3 refills. Information provided to pharmacist with [facility].

## 2013-12-04 NOTE — Telephone Encounter (Signed)
Should be glimeperide 4mg  daily

## 2013-12-04 NOTE — Telephone Encounter (Signed)
Received fax from [facility] stating they received refill for Glimepiride 2mg  qd on 12/01/12 and patient has active duplicate therapy Rx for Glimepiride 4mg  Bid on 07/20/13. Please clarify current therapy. [employer] phone number [phone]. Reference [id number].

## 2013-12-10 DIAGNOSIS — N183 Chronic kidney disease, stage 3 unspecified: Secondary | ICD-10-CM | POA: Diagnosis not present

## 2013-12-10 DIAGNOSIS — I129 Hypertensive chronic kidney disease with stage 1 through stage 4 chronic kidney disease, or unspecified chronic kidney disease: Secondary | ICD-10-CM | POA: Diagnosis not present

## 2013-12-10 DIAGNOSIS — R809 Proteinuria, unspecified: Secondary | ICD-10-CM | POA: Diagnosis not present

## 2013-12-10 DIAGNOSIS — D631 Anemia in chronic kidney disease: Secondary | ICD-10-CM | POA: Diagnosis not present

## 2013-12-10 DIAGNOSIS — N2581 Secondary hyperparathyroidism of renal origin: Secondary | ICD-10-CM | POA: Diagnosis not present

## 2013-12-18 ENCOUNTER — Encounter (HOSPITAL_COMMUNITY)
Admission: RE | Admit: 2013-12-18 | Discharge: 2013-12-18 | Disposition: A | Discharge status: Home / Self Care | Payer: Medicare Other | Source: Ambulatory Visit | Attending: Nephrology | Admitting: Nephrology

## 2013-12-18 DIAGNOSIS — D638 Anemia in other chronic diseases classified elsewhere: Secondary | ICD-10-CM | POA: Diagnosis not present

## 2013-12-18 LAB — POCT HEMOGLOBIN-HEMACUE: Hemoglobin: 10.9 g/dL — ABNORMAL LOW (ref 12.0–15.0)

## 2013-12-18 MED ORDER — DARBEPOETIN ALFA-POLYSORBATE 100 MCG/0.5ML IJ SOLN
INTRAMUSCULAR | Status: AC
  Administered 2013-12-18: 100 ug via SUBCUTANEOUS
  Filled 2013-12-18: qty 0.5

## 2013-12-18 MED ORDER — DARBEPOETIN ALFA-POLYSORBATE 100 MCG/0.5ML IJ SOLN
100.0000 ug | INTRAMUSCULAR | Status: DC
  Administered 2013-12-18: 100 ug via SUBCUTANEOUS

## 2013-12-21 DIAGNOSIS — L89109 Pressure ulcer of unspecified part of back, unspecified stage: Secondary | ICD-10-CM | POA: Diagnosis not present

## 2013-12-21 DIAGNOSIS — L8993 Pressure ulcer of unspecified site, stage 3: Secondary | ICD-10-CM

## 2013-12-21 DIAGNOSIS — I509 Heart failure, unspecified: Secondary | ICD-10-CM

## 2013-12-21 DIAGNOSIS — N183 Chronic kidney disease, stage 3 unspecified: Secondary | ICD-10-CM | POA: Diagnosis not present

## 2013-12-21 DIAGNOSIS — I129 Hypertensive chronic kidney disease with stage 1 through stage 4 chronic kidney disease, or unspecified chronic kidney disease: Secondary | ICD-10-CM | POA: Diagnosis not present

## 2014-01-08 ENCOUNTER — Encounter (HOSPITAL_BASED_OUTPATIENT_CLINIC_OR_DEPARTMENT_OTHER): Payer: Medicare Other | Attending: General Surgery

## 2014-01-08 DIAGNOSIS — Y839 Surgical procedure, unspecified as the cause of abnormal reaction of the patient, or of later complication, without mention of misadventure at the time of the procedure: Secondary | ICD-10-CM | POA: Insufficient documentation

## 2014-01-08 DIAGNOSIS — T8189XA Other complications of procedures, not elsewhere classified, initial encounter: Secondary | ICD-10-CM | POA: Diagnosis not present

## 2014-01-22 ENCOUNTER — Encounter (HOSPITAL_COMMUNITY)
Admission: RE | Admit: 2014-01-22 | Discharge: 2014-01-22 | Disposition: A | Discharge status: Home / Self Care | Payer: Medicare Other | Source: Ambulatory Visit | Attending: Nephrology | Admitting: Nephrology

## 2014-01-22 DIAGNOSIS — D638 Anemia in other chronic diseases classified elsewhere: Secondary | ICD-10-CM | POA: Diagnosis not present

## 2014-01-22 DIAGNOSIS — N183 Chronic kidney disease, stage 3 unspecified: Secondary | ICD-10-CM | POA: Diagnosis not present

## 2014-01-22 LAB — IRON AND TIBC
IRON: 88 ug/dL (ref 42–135)
Saturation Ratios: 32 % (ref 20–55)
TIBC: 271 ug/dL (ref 250–470)
UIBC: 183 ug/dL (ref 125–400)

## 2014-01-22 LAB — FERRITIN: FERRITIN: 493 ng/mL — AB (ref 10–291)

## 2014-01-22 LAB — POCT HEMOGLOBIN-HEMACUE: Hemoglobin: 12.3 g/dL (ref 12.0–15.0)

## 2014-01-22 MED ORDER — DARBEPOETIN ALFA-POLYSORBATE 100 MCG/0.5ML IJ SOLN: 100.0000 ug | INTRAMUSCULAR | Status: DC

## 2014-02-01 DIAGNOSIS — I129 Hypertensive chronic kidney disease with stage 1 through stage 4 chronic kidney disease, or unspecified chronic kidney disease: Secondary | ICD-10-CM | POA: Diagnosis not present

## 2014-02-01 DIAGNOSIS — N189 Chronic kidney disease, unspecified: Secondary | ICD-10-CM | POA: Diagnosis not present

## 2014-02-01 DIAGNOSIS — I509 Heart failure, unspecified: Secondary | ICD-10-CM | POA: Diagnosis not present

## 2014-02-01 DIAGNOSIS — E119 Type 2 diabetes mellitus without complications: Secondary | ICD-10-CM | POA: Diagnosis not present

## 2014-02-01 DIAGNOSIS — L89133 Pressure ulcer of right lower back, stage 3: Secondary | ICD-10-CM | POA: Diagnosis not present

## 2014-02-04 ENCOUNTER — Encounter (HOSPITAL_COMMUNITY)
Admission: RE | Admit: 2014-02-04 | Discharge: 2014-02-04 | Disposition: A | Discharge status: Home / Self Care | Payer: Medicare Other | Source: Ambulatory Visit | Attending: Nephrology | Admitting: Nephrology

## 2014-02-04 DIAGNOSIS — I509 Heart failure, unspecified: Secondary | ICD-10-CM | POA: Diagnosis not present

## 2014-02-04 DIAGNOSIS — L89133 Pressure ulcer of right lower back, stage 3: Secondary | ICD-10-CM | POA: Diagnosis not present

## 2014-02-04 DIAGNOSIS — E119 Type 2 diabetes mellitus without complications: Secondary | ICD-10-CM | POA: Diagnosis not present

## 2014-02-04 DIAGNOSIS — N189 Chronic kidney disease, unspecified: Secondary | ICD-10-CM | POA: Diagnosis not present

## 2014-02-04 DIAGNOSIS — D638 Anemia in other chronic diseases classified elsewhere: Secondary | ICD-10-CM | POA: Diagnosis not present

## 2014-02-04 DIAGNOSIS — I129 Hypertensive chronic kidney disease with stage 1 through stage 4 chronic kidney disease, or unspecified chronic kidney disease: Secondary | ICD-10-CM | POA: Diagnosis not present

## 2014-02-04 LAB — POCT HEMOGLOBIN-HEMACUE: Hemoglobin: 11.4 g/dL — ABNORMAL LOW (ref 12.0–15.0)

## 2014-02-04 MED ORDER — DARBEPOETIN ALFA-POLYSORBATE 100 MCG/0.5ML IJ SOLN
INTRAMUSCULAR | Status: AC
  Filled 2014-02-04: qty 0.5

## 2014-02-04 MED ORDER — DARBEPOETIN ALFA-POLYSORBATE 100 MCG/0.5ML IJ SOLN
100.0000 ug | INTRAMUSCULAR | Status: DC
  Administered 2014-02-04: 100 ug via SUBCUTANEOUS

## 2014-02-08 DIAGNOSIS — I129 Hypertensive chronic kidney disease with stage 1 through stage 4 chronic kidney disease, or unspecified chronic kidney disease: Secondary | ICD-10-CM | POA: Diagnosis not present

## 2014-02-08 DIAGNOSIS — E119 Type 2 diabetes mellitus without complications: Secondary | ICD-10-CM | POA: Diagnosis not present

## 2014-02-08 DIAGNOSIS — N189 Chronic kidney disease, unspecified: Secondary | ICD-10-CM | POA: Diagnosis not present

## 2014-02-08 DIAGNOSIS — I509 Heart failure, unspecified: Secondary | ICD-10-CM | POA: Diagnosis not present

## 2014-02-08 DIAGNOSIS — L89133 Pressure ulcer of right lower back, stage 3: Secondary | ICD-10-CM | POA: Diagnosis not present

## 2014-02-11 DIAGNOSIS — E119 Type 2 diabetes mellitus without complications: Secondary | ICD-10-CM | POA: Diagnosis not present

## 2014-02-11 DIAGNOSIS — L89133 Pressure ulcer of right lower back, stage 3: Secondary | ICD-10-CM | POA: Diagnosis not present

## 2014-02-11 DIAGNOSIS — I129 Hypertensive chronic kidney disease with stage 1 through stage 4 chronic kidney disease, or unspecified chronic kidney disease: Secondary | ICD-10-CM | POA: Diagnosis not present

## 2014-02-11 DIAGNOSIS — I509 Heart failure, unspecified: Secondary | ICD-10-CM | POA: Diagnosis not present

## 2014-02-11 DIAGNOSIS — N189 Chronic kidney disease, unspecified: Secondary | ICD-10-CM | POA: Diagnosis not present

## 2014-02-12 ENCOUNTER — Encounter (HOSPITAL_BASED_OUTPATIENT_CLINIC_OR_DEPARTMENT_OTHER): Payer: Medicare Other | Attending: General Surgery

## 2014-02-12 DIAGNOSIS — L98499 Non-pressure chronic ulcer of skin of other sites with unspecified severity: Secondary | ICD-10-CM | POA: Insufficient documentation

## 2014-02-14 ENCOUNTER — Ambulatory Visit (INDEPENDENT_AMBULATORY_CARE_PROVIDER_SITE_OTHER): Payer: Medicare Other | Admitting: Nurse Practitioner

## 2014-02-14 ENCOUNTER — Encounter: Payer: Self-pay | Admitting: Nurse Practitioner

## 2014-02-14 VITALS — BP 201/86 | HR 67 | Temp 97.7°F | Ht 62.0 in | Wt 193.0 lb

## 2014-02-14 DIAGNOSIS — E038 Other specified hypothyroidism: Secondary | ICD-10-CM

## 2014-02-14 DIAGNOSIS — K2100 Gastro-esophageal reflux disease with esophagitis, without bleeding: Secondary | ICD-10-CM

## 2014-02-14 DIAGNOSIS — I10 Essential (primary) hypertension: Secondary | ICD-10-CM

## 2014-02-14 DIAGNOSIS — E034 Atrophy of thyroid (acquired): Secondary | ICD-10-CM

## 2014-02-14 DIAGNOSIS — E0789 Other specified disorders of thyroid: Secondary | ICD-10-CM

## 2014-02-14 DIAGNOSIS — R635 Abnormal weight gain: Secondary | ICD-10-CM | POA: Diagnosis not present

## 2014-02-14 DIAGNOSIS — IMO0001 Reserved for inherently not codable concepts without codable children: Secondary | ICD-10-CM

## 2014-02-14 DIAGNOSIS — F411 Generalized anxiety disorder: Secondary | ICD-10-CM

## 2014-02-14 DIAGNOSIS — E785 Hyperlipidemia, unspecified: Secondary | ICD-10-CM

## 2014-02-14 DIAGNOSIS — N259 Disorder resulting from impaired renal tubular function, unspecified: Secondary | ICD-10-CM

## 2014-02-14 DIAGNOSIS — E1139 Type 2 diabetes mellitus with other diabetic ophthalmic complication: Secondary | ICD-10-CM

## 2014-02-14 DIAGNOSIS — F329 Major depressive disorder, single episode, unspecified: Secondary | ICD-10-CM

## 2014-02-14 DIAGNOSIS — E1165 Type 2 diabetes mellitus with hyperglycemia: Secondary | ICD-10-CM

## 2014-02-14 DIAGNOSIS — R609 Edema, unspecified: Secondary | ICD-10-CM

## 2014-02-14 DIAGNOSIS — F3289 Other specified depressive episodes: Secondary | ICD-10-CM

## 2014-02-14 DIAGNOSIS — E669 Obesity, unspecified: Secondary | ICD-10-CM

## 2014-02-14 LAB — POCT GLYCOSYLATED HEMOGLOBIN (HGB A1C): Hemoglobin A1C: 7.9

## 2014-02-14 MED ORDER — METOPROLOL SUCCINATE ER 50 MG PO TB24: 50.0000 mg | ORAL_TABLET | Freq: Every day | ORAL | Status: DC

## 2014-02-14 MED ORDER — ATORVASTATIN CALCIUM 40 MG PO TABS: 40.0000 mg | ORAL_TABLET | Freq: Every day | ORAL | Status: DC

## 2014-02-14 MED ORDER — LOSARTAN POTASSIUM 100 MG PO TABS: 100.0000 mg | ORAL_TABLET | Freq: Every day | ORAL | Status: DC

## 2014-02-14 MED ORDER — METOCLOPRAMIDE HCL 5 MG PO TABS: 5.0000 mg | ORAL_TABLET | Freq: Four times a day (QID) | ORAL | Status: DC

## 2014-02-14 MED ORDER — GLIMEPIRIDE 4 MG PO TABS: ORAL_TABLET | ORAL | Status: DC

## 2014-02-14 MED ORDER — PIOGLITAZONE HCL 30 MG PO TABS: 30.0000 mg | ORAL_TABLET | Freq: Every day | ORAL | Status: DC

## 2014-02-14 MED ORDER — FUROSEMIDE 40 MG PO TABS: 40.0000 mg | ORAL_TABLET | Freq: Every day | ORAL | Status: DC

## 2014-02-14 MED ORDER — LEVOTHYROXINE SODIUM 75 MCG PO TABS: 75.0000 ug | ORAL_TABLET | Freq: Every day | ORAL | Status: DC

## 2014-02-14 MED ORDER — PANTOPRAZOLE SODIUM 40 MG PO TBEC: 40.0000 mg | DELAYED_RELEASE_TABLET | Freq: Two times a day (BID) | ORAL | Status: DC

## 2014-02-14 MED ORDER — CLONIDINE HCL 0.2 MG PO TABS: 0.2000 mg | ORAL_TABLET | Freq: Two times a day (BID) | ORAL | Status: DC

## 2014-02-14 NOTE — Patient Instructions (Signed)

## 2014-02-14 NOTE — Addendum Note (Signed)
Addended by: Rolena Infante on: 02/14/2014 11:13 AM   Modules accepted: Orders

## 2014-02-14 NOTE — Progress Notes (Signed)
Subjective:    Patient ID: [name], female    DOB: 1945-12-31, 68 y.o.   MRN: [id number]  Patient here today for follow up- she is doing well without complaints today.   Diabetes She presents for her follow-up diabetic visit. She has type 2 diabetes mellitus. There are no hypoglycemic associated symptoms. Pertinent negatives for hypoglycemia include no headaches. Associated symptoms include visual change. Pertinent negatives for diabetes include no blurred vision, no chest pain, no fatigue, no foot paresthesias, no foot ulcerations, no polydipsia and no polyphagia. There are no hypoglycemic complications. There are no diabetic complications. Risk factors for coronary artery disease include diabetes mellitus, dyslipidemia, hypertension and post-menopausal. Current diabetic treatment includes oral agent (monotherapy). She is compliant with treatment all of the time. Her weight is stable. She is following a diabetic diet. She has not had a previous visit with a dietician. She participates in exercise daily. Her breakfast blood glucose is taken between 8-9 am. Her breakfast blood glucose range is generally 90-110 mg/dl. An ACE inhibitor/angiotensin II receptor blocker is not being taken. She does not see a podiatrist.Eye exam is current (March 2014).  Hyperlipidemia This is a chronic problem. The current episode started more than 1 year ago. The problem is uncontrolled. Recent lipid tests were reviewed and are high. Exacerbating diseases include chronic renal disease, diabetes and hypothyroidism. Factors aggravating her hyperlipidemia include beta blockers. Pertinent negatives include no chest pain, leg pain, myalgias or shortness of breath. Current antihyperlipidemic treatment includes statins. The current treatment provides moderate improvement of lipids. Risk factors for coronary artery disease include diabetes mellitus, dyslipidemia, family history, hypertension, obesity and post-menopausal.   Hypertension This is a chronic problem. The current episode started more than 1 year ago. The problem has been waxing and waning since onset. The problem is uncontrolled. Pertinent negatives include no anxiety, blurred vision, chest pain, headaches, palpitations, peripheral edema or shortness of breath. Risk factors for coronary artery disease include diabetes mellitus, dyslipidemia, family history and post-menopausal state. Past treatments include beta blockers. The current treatment provides mild improvement. Hypertensive end-organ damage includes kidney disease and a thyroid problem. Identifiable causes of hypertension include chronic renal disease.  Gastrophageal Reflux She reports no chest pain, no coughing, no heartburn, no nausea or no sore throat. This is a chronic problem. The current episode started more than 1 year ago. The problem occurs rarely. The problem has been resolved. The symptoms are aggravated by certain foods. Pertinent negatives include no fatigue or muscle weakness. She has tried a PPI for the symptoms. The treatment provided significant relief.  Thyroid Problem Presents for follow-up visit. Symptoms include dry skin, hair loss and visual change. Patient reports no constipation, depressed mood, diaphoresis, fatigue, leg swelling or palpitations. The symptoms have been stable. Past treatments include levothyroxine. The treatment provided significant relief. Her past medical history is significant for diabetes and hyperlipidemia.  Anxiety and Depression Patient stays anxious and nervous all the time. Worries constantly- meds do not help this.    *Review of Systems  Constitutional: Negative for diaphoresis and fatigue.  HENT: Negative for sore throat.   Eyes: Negative for blurred vision.  Respiratory: Negative for cough and shortness of breath.   Cardiovascular: Negative for chest pain and palpitations.  Gastrointestinal: Negative for heartburn, nausea and constipation.   Endocrine: Negative for polydipsia and polyphagia.  Musculoskeletal: Negative for muscle weakness and myalgias.  Neurological: Negative for headaches.  All other systems reviewed and are negative.      Objective:  Physical Exam  Vitals reviewed. Constitutional: She is oriented to person, place, and time. She appears well-developed and well-nourished.  HENT:  Head: Normocephalic.  Right Ear: External ear normal.  Mouth/Throat: Oropharynx is clear and moist.  Eyes: Pupils are equal, round, and reactive to light.  Neck: Normal range of motion. Neck supple. No thyromegaly present.  Cardiovascular: Normal rate, regular rhythm, normal heart sounds and intact distal pulses.   Pulmonary/Chest: Effort normal and breath sounds normal.  Abdominal: Soft. Bowel sounds are normal. She exhibits no distension. There is no tenderness.  Musculoskeletal: Normal range of motion. She exhibits edema (trace amt bil ankles).  Pt broke femur May 2014   Neurological: She is alert and oriented to person, place, and time.  Skin: Skin is warm and dry.  Psychiatric: She has a normal mood and affect. Her behavior is normal. Judgment and thought content normal.  diabetic foot exam within normal limits- (+) monofilament bilaterally   BP 201/86  Pulse 67  Temp(Src) 97.7 F (36.5 C) (Oral)  Ht 5' 2" (1.575 m)  Wt 193 lb (87.544 kg)  BMI 35.29 kg/m2  Results for orders placed in visit on 02/14/14  POCT GLYCOSYLATED HEMOGLOBIN (HGB A1C)      Result Value Ref Range   Hemoglobin A1C 7.9%            Assessment & Plan:   1. HYPERTENSION Low Na+ diet - CMP14+EGFR - metoprolol succinate (TOPROL-XL) 50 MG 24 hr tablet; Take 1 tablet (50 mg total) by mouth daily.  Dispense: 90 tablet; Refill: 1 - losartan (COZAAR) 100 MG tablet; Take 1 tablet (100 mg total) by mouth daily.  Dispense: 90 tablet; Refill: 1 - furosemide (LASIX) 40 MG tablet; Take 1 tablet (40 mg total) by mouth daily.  Dispense: 90 tablet;  Refill: 1 - cloNIDine (CATAPRES) 0.2 MG tablet; Take 1 tablet (0.2 mg total) by mouth 2 (two) times daily.  Dispense: 90 tablet; Refill: 1  2. Type II or unspecified type diabetes mellitus without mention of complication, uncontrolled Carb counting encouraged Added glimepiride 2mg to afternoon dose - POCT glycosylated hemoglobin (Hb A1C) - NMR, lipoprofile - Microalbumin, urine - glimepiride (AMARYL) 4 MG tablet; 1 po qam and 1/2 in afternoon  Dispense: 135 tablet; Refill: 1 - pioglitazone (ACTOS) 30 MG tablet; Take 1 tablet (30 mg total) by mouth daily.  Dispense: 90 tablet; Refill: 1  3. RENAL INSUFFICIENCY Keep follow up with Nephrologist  4. Obesity Discussed diet and exercise for person with BMI >25 Will recheck weight in 3-6 months   5. HYPERLIPIDEMIA Low fat - atorvastatin (LIPITOR) 40 MG tablet; Take 1 tablet (40 mg total) by mouth daily.  Dispense: 90 tablet; Refill: 1  6. Edema  7. DIABETIC  RETINOPATHY  8. DEPRESSION  9. ANXIETY Stress management  10. Gastroesophageal reflux disease with esophagitis Avoid spicy and fatty foods Do not eat 3 hours prior to bedtime - pantoprazole (PROTONIX) 40 MG tablet; Take 1 tablet (40 mg total) by mouth 2 (two) times daily.  Dispense: 180 tablet; Refill: 1  11. Hypothyroidism due to acquired atrophy of thyroid - levothyroxine (SYNTHROID, LEVOTHROID) 75 MCG tablet; Take 1 tablet (75 mcg total) by mouth daily before breakfast.  Dispense: 90 tablet; Refill: 1   Labs pending Health maintenance reviewed Diet and exercise encouraged Continue all meds Follow up  In 3 months   Mary-Margaret , FNP      

## 2014-02-15 DIAGNOSIS — I129 Hypertensive chronic kidney disease with stage 1 through stage 4 chronic kidney disease, or unspecified chronic kidney disease: Secondary | ICD-10-CM | POA: Diagnosis not present

## 2014-02-15 DIAGNOSIS — I509 Heart failure, unspecified: Secondary | ICD-10-CM | POA: Diagnosis not present

## 2014-02-15 DIAGNOSIS — N189 Chronic kidney disease, unspecified: Secondary | ICD-10-CM | POA: Diagnosis not present

## 2014-02-15 DIAGNOSIS — E119 Type 2 diabetes mellitus without complications: Secondary | ICD-10-CM | POA: Diagnosis not present

## 2014-02-15 DIAGNOSIS — L89133 Pressure ulcer of right lower back, stage 3: Secondary | ICD-10-CM | POA: Diagnosis not present

## 2014-02-15 LAB — NMR, LIPOPROFILE
CHOLESTEROL: 106 mg/dL (ref 100–199)
HDL CHOLESTEROL BY NMR: 32 mg/dL — AB (ref >39)
HDL PARTICLE NUMBER: 22.4 umol/L — AB (ref >=30.5)
LDL Particle Number: 498 nmol/L (ref <1000)
LDL Size: 20.8 nm (ref >20.5)
LDLC SERPL CALC-MCNC: 50 mg/dL (ref 0–99)
LP-IR Score: 47 — ABNORMAL HIGH (ref <=45)
SMALL LDL PARTICLE NUMBER: 180 nmol/L (ref <=527)
Triglycerides by NMR: 120 mg/dL (ref 0–149)

## 2014-02-15 LAB — CMP14+EGFR
ALT: 7 IU/L (ref 0–32)
AST: 13 IU/L (ref 0–40)
Albumin/Globulin Ratio: 1.5 (ref 1.1–2.5)
Albumin: 3.8 g/dL (ref 3.6–4.8)
Alkaline Phosphatase: 115 IU/L (ref 39–117)
BILIRUBIN TOTAL: 0.5 mg/dL (ref 0.0–1.2)
BUN/Creatinine Ratio: 14 (ref 11–26)
BUN: 21 mg/dL (ref 8–27)
CO2: 23 mmol/L (ref 18–29)
Calcium: 8.6 mg/dL — ABNORMAL LOW (ref 8.7–10.3)
Chloride: 102 mmol/L (ref 97–108)
Creatinine, Ser: 1.48 mg/dL — ABNORMAL HIGH (ref 0.57–1.00)
GFR, EST AFRICAN AMERICAN: 42 mL/min/{1.73_m2} — AB (ref >59)
GFR, EST NON AFRICAN AMERICAN: 36 mL/min/{1.73_m2} — AB (ref >59)
Globulin, Total: 2.5 g/dL (ref 1.5–4.5)
Glucose: 246 mg/dL — ABNORMAL HIGH (ref 65–99)
POTASSIUM: 4.9 mmol/L (ref 3.5–5.2)
Sodium: 140 mmol/L (ref 134–144)
Total Protein: 6.3 g/dL (ref 6.0–8.5)

## 2014-02-15 LAB — MICROALBUMIN, URINE: MICROALBUM., U, RANDOM: 159.3 ug/mL — AB (ref 0.0–17.0)

## 2014-02-18 DIAGNOSIS — I129 Hypertensive chronic kidney disease with stage 1 through stage 4 chronic kidney disease, or unspecified chronic kidney disease: Secondary | ICD-10-CM | POA: Diagnosis not present

## 2014-02-18 DIAGNOSIS — E119 Type 2 diabetes mellitus without complications: Secondary | ICD-10-CM | POA: Diagnosis not present

## 2014-02-18 DIAGNOSIS — L89133 Pressure ulcer of right lower back, stage 3: Secondary | ICD-10-CM | POA: Diagnosis not present

## 2014-02-18 DIAGNOSIS — N189 Chronic kidney disease, unspecified: Secondary | ICD-10-CM | POA: Diagnosis not present

## 2014-02-18 DIAGNOSIS — I509 Heart failure, unspecified: Secondary | ICD-10-CM | POA: Diagnosis not present

## 2014-02-22 DIAGNOSIS — N189 Chronic kidney disease, unspecified: Secondary | ICD-10-CM | POA: Diagnosis not present

## 2014-02-22 DIAGNOSIS — E119 Type 2 diabetes mellitus without complications: Secondary | ICD-10-CM | POA: Diagnosis not present

## 2014-02-22 DIAGNOSIS — I129 Hypertensive chronic kidney disease with stage 1 through stage 4 chronic kidney disease, or unspecified chronic kidney disease: Secondary | ICD-10-CM | POA: Diagnosis not present

## 2014-02-22 DIAGNOSIS — L89133 Pressure ulcer of right lower back, stage 3: Secondary | ICD-10-CM | POA: Diagnosis not present

## 2014-02-22 DIAGNOSIS — I509 Heart failure, unspecified: Secondary | ICD-10-CM | POA: Diagnosis not present

## 2014-02-25 DIAGNOSIS — I129 Hypertensive chronic kidney disease with stage 1 through stage 4 chronic kidney disease, or unspecified chronic kidney disease: Secondary | ICD-10-CM | POA: Diagnosis not present

## 2014-02-25 DIAGNOSIS — L89133 Pressure ulcer of right lower back, stage 3: Secondary | ICD-10-CM | POA: Diagnosis not present

## 2014-02-25 DIAGNOSIS — N189 Chronic kidney disease, unspecified: Secondary | ICD-10-CM | POA: Diagnosis not present

## 2014-02-25 DIAGNOSIS — E119 Type 2 diabetes mellitus without complications: Secondary | ICD-10-CM | POA: Diagnosis not present

## 2014-02-25 DIAGNOSIS — I509 Heart failure, unspecified: Secondary | ICD-10-CM | POA: Diagnosis not present

## 2014-03-01 DIAGNOSIS — I129 Hypertensive chronic kidney disease with stage 1 through stage 4 chronic kidney disease, or unspecified chronic kidney disease: Secondary | ICD-10-CM | POA: Diagnosis not present

## 2014-03-01 DIAGNOSIS — E119 Type 2 diabetes mellitus without complications: Secondary | ICD-10-CM | POA: Diagnosis not present

## 2014-03-01 DIAGNOSIS — I509 Heart failure, unspecified: Secondary | ICD-10-CM | POA: Diagnosis not present

## 2014-03-01 DIAGNOSIS — L89133 Pressure ulcer of right lower back, stage 3: Secondary | ICD-10-CM | POA: Diagnosis not present

## 2014-03-01 DIAGNOSIS — N189 Chronic kidney disease, unspecified: Secondary | ICD-10-CM | POA: Diagnosis not present

## 2014-03-04 DIAGNOSIS — E119 Type 2 diabetes mellitus without complications: Secondary | ICD-10-CM | POA: Diagnosis not present

## 2014-03-04 DIAGNOSIS — I129 Hypertensive chronic kidney disease with stage 1 through stage 4 chronic kidney disease, or unspecified chronic kidney disease: Secondary | ICD-10-CM | POA: Diagnosis not present

## 2014-03-04 DIAGNOSIS — N189 Chronic kidney disease, unspecified: Secondary | ICD-10-CM | POA: Diagnosis not present

## 2014-03-04 DIAGNOSIS — L89133 Pressure ulcer of right lower back, stage 3: Secondary | ICD-10-CM | POA: Diagnosis not present

## 2014-03-04 DIAGNOSIS — I509 Heart failure, unspecified: Secondary | ICD-10-CM | POA: Diagnosis not present

## 2014-03-08 DIAGNOSIS — N189 Chronic kidney disease, unspecified: Secondary | ICD-10-CM | POA: Diagnosis not present

## 2014-03-08 DIAGNOSIS — I129 Hypertensive chronic kidney disease with stage 1 through stage 4 chronic kidney disease, or unspecified chronic kidney disease: Secondary | ICD-10-CM | POA: Diagnosis not present

## 2014-03-08 DIAGNOSIS — E119 Type 2 diabetes mellitus without complications: Secondary | ICD-10-CM | POA: Diagnosis not present

## 2014-03-08 DIAGNOSIS — I509 Heart failure, unspecified: Secondary | ICD-10-CM | POA: Diagnosis not present

## 2014-03-08 DIAGNOSIS — L89133 Pressure ulcer of right lower back, stage 3: Secondary | ICD-10-CM | POA: Diagnosis not present

## 2014-03-11 ENCOUNTER — Encounter (HOSPITAL_COMMUNITY): Payer: Medicare Other

## 2014-03-11 DIAGNOSIS — I129 Hypertensive chronic kidney disease with stage 1 through stage 4 chronic kidney disease, or unspecified chronic kidney disease: Secondary | ICD-10-CM | POA: Diagnosis not present

## 2014-03-11 DIAGNOSIS — N189 Chronic kidney disease, unspecified: Secondary | ICD-10-CM | POA: Diagnosis not present

## 2014-03-11 DIAGNOSIS — I509 Heart failure, unspecified: Secondary | ICD-10-CM | POA: Diagnosis not present

## 2014-03-11 DIAGNOSIS — L89133 Pressure ulcer of right lower back, stage 3: Secondary | ICD-10-CM | POA: Diagnosis not present

## 2014-03-11 DIAGNOSIS — E119 Type 2 diabetes mellitus without complications: Secondary | ICD-10-CM | POA: Diagnosis not present

## 2014-03-13 ENCOUNTER — Encounter (HOSPITAL_COMMUNITY)
Admission: RE | Admit: 2014-03-13 | Discharge: 2014-03-13 | Disposition: A | Discharge status: Home / Self Care | Payer: Medicare Other | Source: Ambulatory Visit | Attending: Nephrology | Admitting: Nephrology

## 2014-03-13 DIAGNOSIS — D631 Anemia in chronic kidney disease: Secondary | ICD-10-CM | POA: Diagnosis not present

## 2014-03-13 DIAGNOSIS — N183 Chronic kidney disease, stage 3 (moderate): Secondary | ICD-10-CM | POA: Insufficient documentation

## 2014-03-13 DIAGNOSIS — N2581 Secondary hyperparathyroidism of renal origin: Secondary | ICD-10-CM | POA: Diagnosis not present

## 2014-03-13 DIAGNOSIS — R809 Proteinuria, unspecified: Secondary | ICD-10-CM | POA: Diagnosis not present

## 2014-03-13 DIAGNOSIS — I129 Hypertensive chronic kidney disease with stage 1 through stage 4 chronic kidney disease, or unspecified chronic kidney disease: Secondary | ICD-10-CM | POA: Diagnosis not present

## 2014-03-13 LAB — POCT HEMOGLOBIN-HEMACUE: Hemoglobin: 10.7 g/dL — ABNORMAL LOW (ref 12.0–15.0)

## 2014-03-13 MED ORDER — DARBEPOETIN ALFA-POLYSORBATE 100 MCG/0.5ML IJ SOLN
INTRAMUSCULAR | Status: AC
  Filled 2014-03-13: qty 0.5

## 2014-03-13 MED ORDER — DARBEPOETIN ALFA-POLYSORBATE 100 MCG/0.5ML IJ SOLN
100.0000 ug | INTRAMUSCULAR | Status: DC
  Administered 2014-03-13: 100 ug via SUBCUTANEOUS

## 2014-03-15 DIAGNOSIS — I129 Hypertensive chronic kidney disease with stage 1 through stage 4 chronic kidney disease, or unspecified chronic kidney disease: Secondary | ICD-10-CM | POA: Diagnosis not present

## 2014-03-15 DIAGNOSIS — I509 Heart failure, unspecified: Secondary | ICD-10-CM | POA: Diagnosis not present

## 2014-03-15 DIAGNOSIS — N189 Chronic kidney disease, unspecified: Secondary | ICD-10-CM | POA: Diagnosis not present

## 2014-03-15 DIAGNOSIS — L89133 Pressure ulcer of right lower back, stage 3: Secondary | ICD-10-CM | POA: Diagnosis not present

## 2014-03-15 DIAGNOSIS — E119 Type 2 diabetes mellitus without complications: Secondary | ICD-10-CM | POA: Diagnosis not present

## 2014-03-18 DIAGNOSIS — L89133 Pressure ulcer of right lower back, stage 3: Secondary | ICD-10-CM | POA: Diagnosis not present

## 2014-03-18 DIAGNOSIS — I129 Hypertensive chronic kidney disease with stage 1 through stage 4 chronic kidney disease, or unspecified chronic kidney disease: Secondary | ICD-10-CM | POA: Diagnosis not present

## 2014-03-18 DIAGNOSIS — I509 Heart failure, unspecified: Secondary | ICD-10-CM | POA: Diagnosis not present

## 2014-03-18 DIAGNOSIS — E119 Type 2 diabetes mellitus without complications: Secondary | ICD-10-CM | POA: Diagnosis not present

## 2014-03-18 DIAGNOSIS — N189 Chronic kidney disease, unspecified: Secondary | ICD-10-CM | POA: Diagnosis not present

## 2014-03-19 ENCOUNTER — Encounter (HOSPITAL_BASED_OUTPATIENT_CLINIC_OR_DEPARTMENT_OTHER): Payer: Medicare Other | Attending: General Surgery

## 2014-03-19 DIAGNOSIS — T8189XD Other complications of procedures, not elsewhere classified, subsequent encounter: Secondary | ICD-10-CM | POA: Diagnosis not present

## 2014-03-22 DIAGNOSIS — E119 Type 2 diabetes mellitus without complications: Secondary | ICD-10-CM | POA: Diagnosis not present

## 2014-03-22 DIAGNOSIS — I129 Hypertensive chronic kidney disease with stage 1 through stage 4 chronic kidney disease, or unspecified chronic kidney disease: Secondary | ICD-10-CM | POA: Diagnosis not present

## 2014-03-22 DIAGNOSIS — N189 Chronic kidney disease, unspecified: Secondary | ICD-10-CM | POA: Diagnosis not present

## 2014-03-22 DIAGNOSIS — L89133 Pressure ulcer of right lower back, stage 3: Secondary | ICD-10-CM | POA: Diagnosis not present

## 2014-03-22 DIAGNOSIS — I509 Heart failure, unspecified: Secondary | ICD-10-CM | POA: Diagnosis not present

## 2014-03-25 DIAGNOSIS — N189 Chronic kidney disease, unspecified: Secondary | ICD-10-CM | POA: Diagnosis not present

## 2014-03-25 DIAGNOSIS — I509 Heart failure, unspecified: Secondary | ICD-10-CM | POA: Diagnosis not present

## 2014-03-25 DIAGNOSIS — L89133 Pressure ulcer of right lower back, stage 3: Secondary | ICD-10-CM | POA: Diagnosis not present

## 2014-03-25 DIAGNOSIS — I129 Hypertensive chronic kidney disease with stage 1 through stage 4 chronic kidney disease, or unspecified chronic kidney disease: Secondary | ICD-10-CM | POA: Diagnosis not present

## 2014-03-25 DIAGNOSIS — E119 Type 2 diabetes mellitus without complications: Secondary | ICD-10-CM | POA: Diagnosis not present

## 2014-03-27 DIAGNOSIS — I509 Heart failure, unspecified: Secondary | ICD-10-CM | POA: Diagnosis not present

## 2014-03-27 DIAGNOSIS — L89133 Pressure ulcer of right lower back, stage 3: Secondary | ICD-10-CM | POA: Diagnosis not present

## 2014-03-27 DIAGNOSIS — N189 Chronic kidney disease, unspecified: Secondary | ICD-10-CM | POA: Diagnosis not present

## 2014-03-27 DIAGNOSIS — I129 Hypertensive chronic kidney disease with stage 1 through stage 4 chronic kidney disease, or unspecified chronic kidney disease: Secondary | ICD-10-CM | POA: Diagnosis not present

## 2014-03-27 DIAGNOSIS — E119 Type 2 diabetes mellitus without complications: Secondary | ICD-10-CM | POA: Diagnosis not present

## 2014-03-29 DIAGNOSIS — N189 Chronic kidney disease, unspecified: Secondary | ICD-10-CM | POA: Diagnosis not present

## 2014-03-29 DIAGNOSIS — I509 Heart failure, unspecified: Secondary | ICD-10-CM | POA: Diagnosis not present

## 2014-03-29 DIAGNOSIS — L89133 Pressure ulcer of right lower back, stage 3: Secondary | ICD-10-CM | POA: Diagnosis not present

## 2014-03-29 DIAGNOSIS — I129 Hypertensive chronic kidney disease with stage 1 through stage 4 chronic kidney disease, or unspecified chronic kidney disease: Secondary | ICD-10-CM | POA: Diagnosis not present

## 2014-03-29 DIAGNOSIS — E119 Type 2 diabetes mellitus without complications: Secondary | ICD-10-CM | POA: Diagnosis not present

## 2014-04-01 DIAGNOSIS — L89133 Pressure ulcer of right lower back, stage 3: Secondary | ICD-10-CM | POA: Diagnosis not present

## 2014-04-01 DIAGNOSIS — I129 Hypertensive chronic kidney disease with stage 1 through stage 4 chronic kidney disease, or unspecified chronic kidney disease: Secondary | ICD-10-CM | POA: Diagnosis not present

## 2014-04-01 DIAGNOSIS — I509 Heart failure, unspecified: Secondary | ICD-10-CM | POA: Diagnosis not present

## 2014-04-01 DIAGNOSIS — E119 Type 2 diabetes mellitus without complications: Secondary | ICD-10-CM | POA: Diagnosis not present

## 2014-04-01 DIAGNOSIS — N189 Chronic kidney disease, unspecified: Secondary | ICD-10-CM | POA: Diagnosis not present

## 2014-04-08 DIAGNOSIS — H4720 Unspecified optic atrophy: Secondary | ICD-10-CM | POA: Diagnosis not present

## 2014-04-08 DIAGNOSIS — E11359 Type 2 diabetes mellitus with proliferative diabetic retinopathy without macular edema: Secondary | ICD-10-CM | POA: Diagnosis not present

## 2014-04-09 ENCOUNTER — Encounter (HOSPITAL_BASED_OUTPATIENT_CLINIC_OR_DEPARTMENT_OTHER): Payer: Medicare Other | Attending: General Surgery

## 2014-04-09 DIAGNOSIS — S31809D Unspecified open wound of unspecified buttock, subsequent encounter: Secondary | ICD-10-CM | POA: Insufficient documentation

## 2014-04-09 DIAGNOSIS — T8189XD Other complications of procedures, not elsewhere classified, subsequent encounter: Secondary | ICD-10-CM | POA: Diagnosis not present

## 2014-04-09 DIAGNOSIS — Y839 Surgical procedure, unspecified as the cause of abnormal reaction of the patient, or of later complication, without mention of misadventure at the time of the procedure: Secondary | ICD-10-CM | POA: Insufficient documentation

## 2014-04-13 DIAGNOSIS — Z9181 History of falling: Secondary | ICD-10-CM | POA: Diagnosis not present

## 2014-04-13 DIAGNOSIS — N183 Chronic kidney disease, stage 3 (moderate): Secondary | ICD-10-CM | POA: Diagnosis not present

## 2014-04-13 DIAGNOSIS — I129 Hypertensive chronic kidney disease with stage 1 through stage 4 chronic kidney disease, or unspecified chronic kidney disease: Secondary | ICD-10-CM | POA: Diagnosis not present

## 2014-04-13 DIAGNOSIS — E119 Type 2 diabetes mellitus without complications: Secondary | ICD-10-CM | POA: Diagnosis not present

## 2014-04-13 DIAGNOSIS — I509 Heart failure, unspecified: Secondary | ICD-10-CM | POA: Diagnosis not present

## 2014-04-13 DIAGNOSIS — T8189XD Other complications of procedures, not elsewhere classified, subsequent encounter: Secondary | ICD-10-CM | POA: Diagnosis not present

## 2014-04-15 DIAGNOSIS — Z9181 History of falling: Secondary | ICD-10-CM | POA: Diagnosis not present

## 2014-04-15 DIAGNOSIS — I509 Heart failure, unspecified: Secondary | ICD-10-CM | POA: Diagnosis not present

## 2014-04-15 DIAGNOSIS — N183 Chronic kidney disease, stage 3 (moderate): Secondary | ICD-10-CM | POA: Diagnosis not present

## 2014-04-15 DIAGNOSIS — I129 Hypertensive chronic kidney disease with stage 1 through stage 4 chronic kidney disease, or unspecified chronic kidney disease: Secondary | ICD-10-CM | POA: Diagnosis not present

## 2014-04-15 DIAGNOSIS — E119 Type 2 diabetes mellitus without complications: Secondary | ICD-10-CM | POA: Diagnosis not present

## 2014-04-15 DIAGNOSIS — T8189XD Other complications of procedures, not elsewhere classified, subsequent encounter: Secondary | ICD-10-CM | POA: Diagnosis not present

## 2014-04-16 ENCOUNTER — Encounter (HOSPITAL_COMMUNITY)
Admission: RE | Admit: 2014-04-16 | Discharge: 2014-04-16 | Disposition: A | Discharge status: Home / Self Care | Payer: Medicare Other | Source: Ambulatory Visit | Attending: Nephrology | Admitting: Nephrology

## 2014-04-16 DIAGNOSIS — D631 Anemia in chronic kidney disease: Secondary | ICD-10-CM | POA: Diagnosis not present

## 2014-04-16 DIAGNOSIS — N183 Chronic kidney disease, stage 3 (moderate): Secondary | ICD-10-CM | POA: Insufficient documentation

## 2014-04-16 LAB — POCT HEMOGLOBIN-HEMACUE: HEMOGLOBIN: 11.6 g/dL — AB (ref 12.0–15.0)

## 2014-04-16 LAB — FERRITIN: FERRITIN: 450 ng/mL — AB (ref 10–291)

## 2014-04-16 LAB — IRON AND TIBC
Iron: 90 ug/dL (ref 42–135)
Saturation Ratios: 38 % (ref 20–55)
TIBC: 240 ug/dL — ABNORMAL LOW (ref 250–470)
UIBC: 150 ug/dL (ref 125–400)

## 2014-04-16 MED ORDER — DARBEPOETIN ALFA 100 MCG/0.5ML IJ SOSY
100.0000 ug | PREFILLED_SYRINGE | INTRAMUSCULAR | Status: DC
  Administered 2014-04-16: 100 ug via SUBCUTANEOUS

## 2014-04-16 MED ORDER — DARBEPOETIN ALFA 100 MCG/0.5ML IJ SOSY
PREFILLED_SYRINGE | INTRAMUSCULAR | Status: AC
  Filled 2014-04-16: qty 0.5

## 2014-04-17 DIAGNOSIS — I509 Heart failure, unspecified: Secondary | ICD-10-CM | POA: Diagnosis not present

## 2014-04-17 DIAGNOSIS — E119 Type 2 diabetes mellitus without complications: Secondary | ICD-10-CM | POA: Diagnosis not present

## 2014-04-17 DIAGNOSIS — T8189XD Other complications of procedures, not elsewhere classified, subsequent encounter: Secondary | ICD-10-CM | POA: Diagnosis not present

## 2014-04-17 DIAGNOSIS — I129 Hypertensive chronic kidney disease with stage 1 through stage 4 chronic kidney disease, or unspecified chronic kidney disease: Secondary | ICD-10-CM | POA: Diagnosis not present

## 2014-04-17 DIAGNOSIS — N183 Chronic kidney disease, stage 3 (moderate): Secondary | ICD-10-CM | POA: Diagnosis not present

## 2014-04-17 DIAGNOSIS — Z9181 History of falling: Secondary | ICD-10-CM | POA: Diagnosis not present

## 2014-04-20 DIAGNOSIS — Z9181 History of falling: Secondary | ICD-10-CM | POA: Diagnosis not present

## 2014-04-20 DIAGNOSIS — I129 Hypertensive chronic kidney disease with stage 1 through stage 4 chronic kidney disease, or unspecified chronic kidney disease: Secondary | ICD-10-CM | POA: Diagnosis not present

## 2014-04-20 DIAGNOSIS — N183 Chronic kidney disease, stage 3 (moderate): Secondary | ICD-10-CM | POA: Diagnosis not present

## 2014-04-20 DIAGNOSIS — T8189XD Other complications of procedures, not elsewhere classified, subsequent encounter: Secondary | ICD-10-CM | POA: Diagnosis not present

## 2014-04-20 DIAGNOSIS — E119 Type 2 diabetes mellitus without complications: Secondary | ICD-10-CM | POA: Diagnosis not present

## 2014-04-20 DIAGNOSIS — I509 Heart failure, unspecified: Secondary | ICD-10-CM | POA: Diagnosis not present

## 2014-04-22 DIAGNOSIS — Z9181 History of falling: Secondary | ICD-10-CM | POA: Diagnosis not present

## 2014-04-22 DIAGNOSIS — I129 Hypertensive chronic kidney disease with stage 1 through stage 4 chronic kidney disease, or unspecified chronic kidney disease: Secondary | ICD-10-CM | POA: Diagnosis not present

## 2014-04-22 DIAGNOSIS — N183 Chronic kidney disease, stage 3 (moderate): Secondary | ICD-10-CM | POA: Diagnosis not present

## 2014-04-22 DIAGNOSIS — I509 Heart failure, unspecified: Secondary | ICD-10-CM | POA: Diagnosis not present

## 2014-04-22 DIAGNOSIS — E119 Type 2 diabetes mellitus without complications: Secondary | ICD-10-CM | POA: Diagnosis not present

## 2014-04-22 DIAGNOSIS — T8189XD Other complications of procedures, not elsewhere classified, subsequent encounter: Secondary | ICD-10-CM | POA: Diagnosis not present

## 2014-04-23 DIAGNOSIS — E119 Type 2 diabetes mellitus without complications: Secondary | ICD-10-CM | POA: Diagnosis not present

## 2014-04-23 DIAGNOSIS — N183 Chronic kidney disease, stage 3 (moderate): Secondary | ICD-10-CM | POA: Diagnosis not present

## 2014-04-23 DIAGNOSIS — I129 Hypertensive chronic kidney disease with stage 1 through stage 4 chronic kidney disease, or unspecified chronic kidney disease: Secondary | ICD-10-CM | POA: Diagnosis not present

## 2014-04-23 DIAGNOSIS — T8189XD Other complications of procedures, not elsewhere classified, subsequent encounter: Secondary | ICD-10-CM | POA: Diagnosis not present

## 2014-04-23 DIAGNOSIS — I509 Heart failure, unspecified: Secondary | ICD-10-CM | POA: Diagnosis not present

## 2014-04-23 DIAGNOSIS — Z9181 History of falling: Secondary | ICD-10-CM | POA: Diagnosis not present

## 2014-04-26 DIAGNOSIS — I509 Heart failure, unspecified: Secondary | ICD-10-CM | POA: Diagnosis not present

## 2014-04-26 DIAGNOSIS — N183 Chronic kidney disease, stage 3 (moderate): Secondary | ICD-10-CM | POA: Diagnosis not present

## 2014-04-26 DIAGNOSIS — Z9181 History of falling: Secondary | ICD-10-CM | POA: Diagnosis not present

## 2014-04-26 DIAGNOSIS — I129 Hypertensive chronic kidney disease with stage 1 through stage 4 chronic kidney disease, or unspecified chronic kidney disease: Secondary | ICD-10-CM | POA: Diagnosis not present

## 2014-04-26 DIAGNOSIS — T8189XD Other complications of procedures, not elsewhere classified, subsequent encounter: Secondary | ICD-10-CM | POA: Diagnosis not present

## 2014-04-26 DIAGNOSIS — E119 Type 2 diabetes mellitus without complications: Secondary | ICD-10-CM | POA: Diagnosis not present

## 2014-04-29 DIAGNOSIS — I509 Heart failure, unspecified: Secondary | ICD-10-CM | POA: Diagnosis not present

## 2014-04-29 DIAGNOSIS — I129 Hypertensive chronic kidney disease with stage 1 through stage 4 chronic kidney disease, or unspecified chronic kidney disease: Secondary | ICD-10-CM | POA: Diagnosis not present

## 2014-04-29 DIAGNOSIS — N183 Chronic kidney disease, stage 3 (moderate): Secondary | ICD-10-CM | POA: Diagnosis not present

## 2014-04-29 DIAGNOSIS — T8189XD Other complications of procedures, not elsewhere classified, subsequent encounter: Secondary | ICD-10-CM | POA: Diagnosis not present

## 2014-04-29 DIAGNOSIS — E119 Type 2 diabetes mellitus without complications: Secondary | ICD-10-CM | POA: Diagnosis not present

## 2014-04-29 DIAGNOSIS — Z9181 History of falling: Secondary | ICD-10-CM | POA: Diagnosis not present

## 2014-04-30 ENCOUNTER — Encounter (HOSPITAL_BASED_OUTPATIENT_CLINIC_OR_DEPARTMENT_OTHER): Payer: Medicare Other | Attending: General Surgery

## 2014-04-30 DIAGNOSIS — K603 Anal fistula: Secondary | ICD-10-CM | POA: Diagnosis not present

## 2014-04-30 DIAGNOSIS — T8189XD Other complications of procedures, not elsewhere classified, subsequent encounter: Secondary | ICD-10-CM | POA: Insufficient documentation

## 2014-04-30 DIAGNOSIS — Y839 Surgical procedure, unspecified as the cause of abnormal reaction of the patient, or of later complication, without mention of misadventure at the time of the procedure: Secondary | ICD-10-CM | POA: Diagnosis not present

## 2014-04-30 DIAGNOSIS — I509 Heart failure, unspecified: Secondary | ICD-10-CM | POA: Diagnosis not present

## 2014-04-30 DIAGNOSIS — N183 Chronic kidney disease, stage 3 (moderate): Secondary | ICD-10-CM | POA: Diagnosis not present

## 2014-04-30 DIAGNOSIS — Z9181 History of falling: Secondary | ICD-10-CM | POA: Diagnosis not present

## 2014-04-30 DIAGNOSIS — I129 Hypertensive chronic kidney disease with stage 1 through stage 4 chronic kidney disease, or unspecified chronic kidney disease: Secondary | ICD-10-CM | POA: Diagnosis not present

## 2014-04-30 DIAGNOSIS — E119 Type 2 diabetes mellitus without complications: Secondary | ICD-10-CM | POA: Diagnosis not present

## 2014-05-02 DIAGNOSIS — Z9181 History of falling: Secondary | ICD-10-CM | POA: Diagnosis not present

## 2014-05-02 DIAGNOSIS — I509 Heart failure, unspecified: Secondary | ICD-10-CM | POA: Diagnosis not present

## 2014-05-02 DIAGNOSIS — I129 Hypertensive chronic kidney disease with stage 1 through stage 4 chronic kidney disease, or unspecified chronic kidney disease: Secondary | ICD-10-CM | POA: Diagnosis not present

## 2014-05-02 DIAGNOSIS — E119 Type 2 diabetes mellitus without complications: Secondary | ICD-10-CM | POA: Diagnosis not present

## 2014-05-02 DIAGNOSIS — T8189XD Other complications of procedures, not elsewhere classified, subsequent encounter: Secondary | ICD-10-CM | POA: Diagnosis not present

## 2014-05-02 DIAGNOSIS — N183 Chronic kidney disease, stage 3 (moderate): Secondary | ICD-10-CM | POA: Diagnosis not present

## 2014-05-03 DIAGNOSIS — T8189XD Other complications of procedures, not elsewhere classified, subsequent encounter: Secondary | ICD-10-CM | POA: Diagnosis not present

## 2014-05-03 DIAGNOSIS — Z9181 History of falling: Secondary | ICD-10-CM | POA: Diagnosis not present

## 2014-05-03 DIAGNOSIS — E119 Type 2 diabetes mellitus without complications: Secondary | ICD-10-CM | POA: Diagnosis not present

## 2014-05-03 DIAGNOSIS — I509 Heart failure, unspecified: Secondary | ICD-10-CM | POA: Diagnosis not present

## 2014-05-03 DIAGNOSIS — N183 Chronic kidney disease, stage 3 (moderate): Secondary | ICD-10-CM | POA: Diagnosis not present

## 2014-05-03 DIAGNOSIS — I129 Hypertensive chronic kidney disease with stage 1 through stage 4 chronic kidney disease, or unspecified chronic kidney disease: Secondary | ICD-10-CM | POA: Diagnosis not present

## 2014-05-06 DIAGNOSIS — N183 Chronic kidney disease, stage 3 (moderate): Secondary | ICD-10-CM | POA: Diagnosis not present

## 2014-05-06 DIAGNOSIS — I129 Hypertensive chronic kidney disease with stage 1 through stage 4 chronic kidney disease, or unspecified chronic kidney disease: Secondary | ICD-10-CM | POA: Diagnosis not present

## 2014-05-06 DIAGNOSIS — I509 Heart failure, unspecified: Secondary | ICD-10-CM | POA: Diagnosis not present

## 2014-05-06 DIAGNOSIS — Z9181 History of falling: Secondary | ICD-10-CM | POA: Diagnosis not present

## 2014-05-06 DIAGNOSIS — E119 Type 2 diabetes mellitus without complications: Secondary | ICD-10-CM | POA: Diagnosis not present

## 2014-05-06 DIAGNOSIS — T8189XD Other complications of procedures, not elsewhere classified, subsequent encounter: Secondary | ICD-10-CM | POA: Diagnosis not present

## 2014-05-07 DIAGNOSIS — N183 Chronic kidney disease, stage 3 (moderate): Secondary | ICD-10-CM | POA: Diagnosis not present

## 2014-05-07 DIAGNOSIS — T8189XD Other complications of procedures, not elsewhere classified, subsequent encounter: Secondary | ICD-10-CM | POA: Diagnosis not present

## 2014-05-07 DIAGNOSIS — I129 Hypertensive chronic kidney disease with stage 1 through stage 4 chronic kidney disease, or unspecified chronic kidney disease: Secondary | ICD-10-CM | POA: Diagnosis not present

## 2014-05-07 DIAGNOSIS — Z9181 History of falling: Secondary | ICD-10-CM | POA: Diagnosis not present

## 2014-05-07 DIAGNOSIS — I509 Heart failure, unspecified: Secondary | ICD-10-CM | POA: Diagnosis not present

## 2014-05-07 DIAGNOSIS — E119 Type 2 diabetes mellitus without complications: Secondary | ICD-10-CM | POA: Diagnosis not present

## 2014-05-08 ENCOUNTER — Other Ambulatory Visit: Payer: Self-pay | Admitting: Nurse Practitioner

## 2014-05-09 DIAGNOSIS — D485 Neoplasm of uncertain behavior of skin: Secondary | ICD-10-CM | POA: Diagnosis not present

## 2014-05-09 DIAGNOSIS — X32XXXD Exposure to sunlight, subsequent encounter: Secondary | ICD-10-CM | POA: Diagnosis not present

## 2014-05-09 DIAGNOSIS — L570 Actinic keratosis: Secondary | ICD-10-CM | POA: Diagnosis not present

## 2014-05-10 DIAGNOSIS — N183 Chronic kidney disease, stage 3 (moderate): Secondary | ICD-10-CM | POA: Diagnosis not present

## 2014-05-10 DIAGNOSIS — I509 Heart failure, unspecified: Secondary | ICD-10-CM | POA: Diagnosis not present

## 2014-05-10 DIAGNOSIS — I129 Hypertensive chronic kidney disease with stage 1 through stage 4 chronic kidney disease, or unspecified chronic kidney disease: Secondary | ICD-10-CM | POA: Diagnosis not present

## 2014-05-10 DIAGNOSIS — T8189XD Other complications of procedures, not elsewhere classified, subsequent encounter: Secondary | ICD-10-CM | POA: Diagnosis not present

## 2014-05-10 DIAGNOSIS — Z9181 History of falling: Secondary | ICD-10-CM | POA: Diagnosis not present

## 2014-05-10 DIAGNOSIS — E119 Type 2 diabetes mellitus without complications: Secondary | ICD-10-CM | POA: Diagnosis not present

## 2014-05-13 DIAGNOSIS — I509 Heart failure, unspecified: Secondary | ICD-10-CM | POA: Diagnosis not present

## 2014-05-13 DIAGNOSIS — Z9181 History of falling: Secondary | ICD-10-CM | POA: Diagnosis not present

## 2014-05-13 DIAGNOSIS — N183 Chronic kidney disease, stage 3 (moderate): Secondary | ICD-10-CM | POA: Diagnosis not present

## 2014-05-13 DIAGNOSIS — T8189XD Other complications of procedures, not elsewhere classified, subsequent encounter: Secondary | ICD-10-CM | POA: Diagnosis not present

## 2014-05-13 DIAGNOSIS — E119 Type 2 diabetes mellitus without complications: Secondary | ICD-10-CM | POA: Diagnosis not present

## 2014-05-13 DIAGNOSIS — I129 Hypertensive chronic kidney disease with stage 1 through stage 4 chronic kidney disease, or unspecified chronic kidney disease: Secondary | ICD-10-CM | POA: Diagnosis not present

## 2014-05-15 DIAGNOSIS — I509 Heart failure, unspecified: Secondary | ICD-10-CM | POA: Diagnosis not present

## 2014-05-15 DIAGNOSIS — Z9181 History of falling: Secondary | ICD-10-CM | POA: Diagnosis not present

## 2014-05-15 DIAGNOSIS — T8189XD Other complications of procedures, not elsewhere classified, subsequent encounter: Secondary | ICD-10-CM | POA: Diagnosis not present

## 2014-05-15 DIAGNOSIS — N183 Chronic kidney disease, stage 3 (moderate): Secondary | ICD-10-CM | POA: Diagnosis not present

## 2014-05-15 DIAGNOSIS — E119 Type 2 diabetes mellitus without complications: Secondary | ICD-10-CM | POA: Diagnosis not present

## 2014-05-15 DIAGNOSIS — I129 Hypertensive chronic kidney disease with stage 1 through stage 4 chronic kidney disease, or unspecified chronic kidney disease: Secondary | ICD-10-CM | POA: Diagnosis not present

## 2014-05-17 ENCOUNTER — Inpatient Hospital Stay (HOSPITAL_COMMUNITY)
Admission: EM | Admit: 2014-05-17 | Discharge: 2014-05-23 | DRG: 480 | Disposition: A | Discharge status: Skilled Nursing Facility | Payer: Medicare Other | Attending: Family Medicine | Admitting: Family Medicine

## 2014-05-17 ENCOUNTER — Emergency Department (HOSPITAL_COMMUNITY): Payer: Medicare Other

## 2014-05-17 ENCOUNTER — Encounter (HOSPITAL_COMMUNITY): Payer: Self-pay | Admitting: Emergency Medicine

## 2014-05-17 DIAGNOSIS — J189 Pneumonia, unspecified organism: Secondary | ICD-10-CM | POA: Diagnosis not present

## 2014-05-17 DIAGNOSIS — S72101D Unspecified trochanteric fracture of right femur, subsequent encounter for closed fracture with routine healing: Secondary | ICD-10-CM | POA: Diagnosis not present

## 2014-05-17 DIAGNOSIS — E1159 Type 2 diabetes mellitus with other circulatory complications: Secondary | ICD-10-CM

## 2014-05-17 DIAGNOSIS — Z794 Long term (current) use of insulin: Secondary | ICD-10-CM

## 2014-05-17 DIAGNOSIS — S299XXA Unspecified injury of thorax, initial encounter: Secondary | ICD-10-CM | POA: Diagnosis not present

## 2014-05-17 DIAGNOSIS — I2510 Atherosclerotic heart disease of native coronary artery without angina pectoris: Secondary | ICD-10-CM | POA: Diagnosis present

## 2014-05-17 DIAGNOSIS — Z8249 Family history of ischemic heart disease and other diseases of the circulatory system: Secondary | ICD-10-CM | POA: Diagnosis not present

## 2014-05-17 DIAGNOSIS — S72001A Fracture of unspecified part of neck of right femur, initial encounter for closed fracture: Secondary | ICD-10-CM | POA: Diagnosis present

## 2014-05-17 DIAGNOSIS — I10 Essential (primary) hypertension: Secondary | ICD-10-CM | POA: Diagnosis present

## 2014-05-17 DIAGNOSIS — Z6835 Body mass index (BMI) 35.0-35.9, adult: Secondary | ICD-10-CM

## 2014-05-17 DIAGNOSIS — S72091A Other fracture of head and neck of right femur, initial encounter for closed fracture: Secondary | ICD-10-CM | POA: Diagnosis not present

## 2014-05-17 DIAGNOSIS — E119 Type 2 diabetes mellitus without complications: Secondary | ICD-10-CM | POA: Diagnosis not present

## 2014-05-17 DIAGNOSIS — K226 Gastro-esophageal laceration-hemorrhage syndrome: Secondary | ICD-10-CM | POA: Diagnosis present

## 2014-05-17 DIAGNOSIS — E1143 Type 2 diabetes mellitus with diabetic autonomic (poly)neuropathy: Secondary | ICD-10-CM | POA: Diagnosis present

## 2014-05-17 DIAGNOSIS — G92 Toxic encephalopathy: Secondary | ICD-10-CM | POA: Diagnosis not present

## 2014-05-17 DIAGNOSIS — E279 Disorder of adrenal gland, unspecified: Secondary | ICD-10-CM | POA: Diagnosis present

## 2014-05-17 DIAGNOSIS — K760 Fatty (change of) liver, not elsewhere classified: Secondary | ICD-10-CM | POA: Diagnosis present

## 2014-05-17 DIAGNOSIS — D62 Acute posthemorrhagic anemia: Secondary | ICD-10-CM | POA: Diagnosis present

## 2014-05-17 DIAGNOSIS — R4182 Altered mental status, unspecified: Secondary | ICD-10-CM | POA: Diagnosis not present

## 2014-05-17 DIAGNOSIS — I2699 Other pulmonary embolism without acute cor pulmonale: Secondary | ICD-10-CM

## 2014-05-17 DIAGNOSIS — F411 Generalized anxiety disorder: Secondary | ICD-10-CM | POA: Diagnosis present

## 2014-05-17 DIAGNOSIS — M1990 Unspecified osteoarthritis, unspecified site: Secondary | ICD-10-CM | POA: Diagnosis present

## 2014-05-17 DIAGNOSIS — E669 Obesity, unspecified: Secondary | ICD-10-CM | POA: Diagnosis present

## 2014-05-17 DIAGNOSIS — R410 Disorientation, unspecified: Secondary | ICD-10-CM | POA: Diagnosis not present

## 2014-05-17 DIAGNOSIS — R443 Hallucinations, unspecified: Secondary | ICD-10-CM | POA: Diagnosis not present

## 2014-05-17 DIAGNOSIS — E11319 Type 2 diabetes mellitus with unspecified diabetic retinopathy without macular edema: Secondary | ICD-10-CM | POA: Diagnosis present

## 2014-05-17 DIAGNOSIS — I129 Hypertensive chronic kidney disease with stage 1 through stage 4 chronic kidney disease, or unspecified chronic kidney disease: Secondary | ICD-10-CM | POA: Diagnosis present

## 2014-05-17 DIAGNOSIS — S72009D Fracture of unspecified part of neck of unspecified femur, subsequent encounter for closed fracture with routine healing: Secondary | ICD-10-CM | POA: Diagnosis not present

## 2014-05-17 DIAGNOSIS — F29 Unspecified psychosis not due to a substance or known physiological condition: Secondary | ICD-10-CM | POA: Diagnosis not present

## 2014-05-17 DIAGNOSIS — N183 Chronic kidney disease, stage 3 (moderate): Secondary | ICD-10-CM | POA: Diagnosis present

## 2014-05-17 DIAGNOSIS — E1122 Type 2 diabetes mellitus with diabetic chronic kidney disease: Secondary | ICD-10-CM

## 2014-05-17 DIAGNOSIS — R0902 Hypoxemia: Secondary | ICD-10-CM | POA: Diagnosis not present

## 2014-05-17 DIAGNOSIS — N1832 Type 2 diabetes mellitus with diabetic chronic kidney disease: Secondary | ICD-10-CM

## 2014-05-17 DIAGNOSIS — K219 Gastro-esophageal reflux disease without esophagitis: Secondary | ICD-10-CM | POA: Diagnosis present

## 2014-05-17 DIAGNOSIS — Z96641 Presence of right artificial hip joint: Secondary | ICD-10-CM | POA: Diagnosis not present

## 2014-05-17 DIAGNOSIS — Z833 Family history of diabetes mellitus: Secondary | ICD-10-CM | POA: Diagnosis not present

## 2014-05-17 DIAGNOSIS — F329 Major depressive disorder, single episode, unspecified: Secondary | ICD-10-CM | POA: Diagnosis present

## 2014-05-17 DIAGNOSIS — Z86010 Personal history of colonic polyps: Secondary | ICD-10-CM

## 2014-05-17 DIAGNOSIS — M25551 Pain in right hip: Secondary | ICD-10-CM | POA: Diagnosis not present

## 2014-05-17 DIAGNOSIS — R278 Other lack of coordination: Secondary | ICD-10-CM | POA: Diagnosis not present

## 2014-05-17 DIAGNOSIS — J690 Pneumonitis due to inhalation of food and vomit: Secondary | ICD-10-CM | POA: Diagnosis not present

## 2014-05-17 DIAGNOSIS — R918 Other nonspecific abnormal finding of lung field: Secondary | ICD-10-CM | POA: Diagnosis not present

## 2014-05-17 DIAGNOSIS — S72101A Unspecified trochanteric fracture of right femur, initial encounter for closed fracture: Secondary | ICD-10-CM | POA: Diagnosis not present

## 2014-05-17 DIAGNOSIS — W19XXXA Unspecified fall, initial encounter: Secondary | ICD-10-CM | POA: Diagnosis not present

## 2014-05-17 DIAGNOSIS — S72141A Displaced intertrochanteric fracture of right femur, initial encounter for closed fracture: Principal | ICD-10-CM | POA: Insufficient documentation

## 2014-05-17 DIAGNOSIS — K3184 Gastroparesis: Secondary | ICD-10-CM | POA: Diagnosis present

## 2014-05-17 DIAGNOSIS — N10 Acute tubulo-interstitial nephritis: Secondary | ICD-10-CM | POA: Diagnosis not present

## 2014-05-17 DIAGNOSIS — S79919A Unspecified injury of unspecified hip, initial encounter: Secondary | ICD-10-CM | POA: Diagnosis not present

## 2014-05-17 DIAGNOSIS — E034 Atrophy of thyroid (acquired): Secondary | ICD-10-CM | POA: Diagnosis present

## 2014-05-17 DIAGNOSIS — R52 Pain, unspecified: Secondary | ICD-10-CM | POA: Diagnosis not present

## 2014-05-17 DIAGNOSIS — D631 Anemia in chronic kidney disease: Secondary | ICD-10-CM | POA: Diagnosis present

## 2014-05-17 DIAGNOSIS — Z4789 Encounter for other orthopedic aftercare: Secondary | ICD-10-CM | POA: Diagnosis not present

## 2014-05-17 DIAGNOSIS — R2689 Other abnormalities of gait and mobility: Secondary | ICD-10-CM | POA: Diagnosis not present

## 2014-05-17 DIAGNOSIS — E039 Hypothyroidism, unspecified: Secondary | ICD-10-CM | POA: Diagnosis not present

## 2014-05-17 DIAGNOSIS — W19XXXD Unspecified fall, subsequent encounter: Secondary | ICD-10-CM | POA: Diagnosis not present

## 2014-05-17 DIAGNOSIS — S72009A Fracture of unspecified part of neck of unspecified femur, initial encounter for closed fracture: Secondary | ICD-10-CM | POA: Diagnosis present

## 2014-05-17 DIAGNOSIS — R293 Abnormal posture: Secondary | ICD-10-CM | POA: Diagnosis not present

## 2014-05-17 DIAGNOSIS — E785 Hyperlipidemia, unspecified: Secondary | ICD-10-CM | POA: Diagnosis present

## 2014-05-17 DIAGNOSIS — I152 Hypertension secondary to endocrine disorders: Secondary | ICD-10-CM | POA: Diagnosis present

## 2014-05-17 DIAGNOSIS — F419 Anxiety disorder, unspecified: Secondary | ICD-10-CM | POA: Diagnosis not present

## 2014-05-17 DIAGNOSIS — M6281 Muscle weakness (generalized): Secondary | ICD-10-CM | POA: Diagnosis not present

## 2014-05-17 LAB — COMPREHENSIVE METABOLIC PANEL
ALBUMIN: 3.7 g/dL (ref 3.5–5.2)
ALK PHOS: 115 U/L (ref 39–117)
ALT: 11 U/L (ref 0–35)
ALT: 11 U/L (ref 0–35)
AST: 20 U/L (ref 0–37)
AST: 19 U/L (ref 0–37)
Albumin: 3.5 g/dL (ref 3.5–5.2)
Alkaline Phosphatase: 101 U/L (ref 39–117)
Anion gap: 8 (ref 5–15)
Anion gap: 7 (ref 5–15)
BUN: 34 mg/dL — AB (ref 6–23)
BUN: 35 mg/dL — ABNORMAL HIGH (ref 6–23)
CALCIUM: 9 mg/dL (ref 8.4–10.5)
CO2: 24 mmol/L (ref 19–32)
CO2: 24 mmol/L (ref 19–32)
Calcium: 9.1 mg/dL (ref 8.4–10.5)
Chloride: 106 mEq/L (ref 96–112)
Chloride: 106 mEq/L (ref 96–112)
Creatinine, Ser: 1.54 mg/dL — ABNORMAL HIGH (ref 0.50–1.10)
Creatinine, Ser: 1.58 mg/dL — ABNORMAL HIGH (ref 0.50–1.10)
GFR calc Af Amer: 39 mL/min — ABNORMAL LOW (ref >90)
GFR calc Af Amer: 38 mL/min — ABNORMAL LOW (ref >90)
GFR calc non Af Amer: 34 mL/min — ABNORMAL LOW (ref >90)
GFR, EST NON AFRICAN AMERICAN: 33 mL/min — AB (ref >90)
Glucose, Bld: 299 mg/dL — ABNORMAL HIGH (ref 70–99)
Glucose, Bld: 302 mg/dL — ABNORMAL HIGH (ref 70–99)
POTASSIUM: 5.1 mmol/L (ref 3.5–5.1)
Potassium: 5.2 mmol/L — ABNORMAL HIGH (ref 3.5–5.1)
SODIUM: 138 mmol/L (ref 135–145)
SODIUM: 137 mmol/L (ref 135–145)
Total Bilirubin: 0.6 mg/dL (ref 0.3–1.2)
Total Bilirubin: 0.6 mg/dL (ref 0.3–1.2)
Total Protein: 7.1 g/dL (ref 6.0–8.3)
Total Protein: 6.8 g/dL (ref 6.0–8.3)

## 2014-05-17 LAB — CBC WITH DIFFERENTIAL/PLATELET
BASOS ABS: 0 10*3/uL (ref 0.0–0.1)
BASOS PCT: 0 % (ref 0–1)
BASOS PCT: 0 % (ref 0–1)
Basophils Absolute: 0 10*3/uL (ref 0.0–0.1)
EOS PCT: 0 % (ref 0–5)
Eosinophils Absolute: 0.1 10*3/uL (ref 0.0–0.7)
Eosinophils Absolute: 0 10*3/uL (ref 0.0–0.7)
Eosinophils Relative: 1 % (ref 0–5)
HCT: 38.1 % (ref 36.0–46.0)
HEMATOCRIT: 35.2 % — AB (ref 36.0–46.0)
Hemoglobin: 12.1 g/dL (ref 12.0–15.0)
Hemoglobin: 11.4 g/dL — ABNORMAL LOW (ref 12.0–15.0)
LYMPHS PCT: 9 % — AB (ref 12–46)
Lymphocytes Relative: 13 % (ref 12–46)
Lymphs Abs: 1.3 10*3/uL (ref 0.7–4.0)
Lymphs Abs: 1 10*3/uL (ref 0.7–4.0)
MCH: 29.7 pg (ref 26.0–34.0)
MCH: 30.1 pg (ref 26.0–34.0)
MCHC: 31.8 g/dL (ref 30.0–36.0)
MCHC: 32.4 g/dL (ref 30.0–36.0)
MCV: 93.6 fL (ref 78.0–100.0)
MCV: 92.9 fL (ref 78.0–100.0)
MONO ABS: 0.5 10*3/uL (ref 0.1–1.0)
Monocytes Absolute: 0.5 10*3/uL (ref 0.1–1.0)
Monocytes Relative: 5 % (ref 3–12)
Monocytes Relative: 5 % (ref 3–12)
NEUTROS ABS: 7.9 10*3/uL — AB (ref 1.7–7.7)
NEUTROS PCT: 81 % — AB (ref 43–77)
Neutro Abs: 9.6 10*3/uL — ABNORMAL HIGH (ref 1.7–7.7)
Neutrophils Relative %: 86 % — ABNORMAL HIGH (ref 43–77)
PLATELETS: 172 10*3/uL (ref 150–400)
Platelets: 163 10*3/uL (ref 150–400)
RBC: 4.07 MIL/uL (ref 3.87–5.11)
RBC: 3.79 MIL/uL — ABNORMAL LOW (ref 3.87–5.11)
RDW: 13.6 % (ref 11.5–15.5)
RDW: 13.6 % (ref 11.5–15.5)
WBC: 9.9 10*3/uL (ref 4.0–10.5)
WBC: 11.1 10*3/uL — AB (ref 4.0–10.5)

## 2014-05-17 LAB — GLUCOSE, CAPILLARY: GLUCOSE-CAPILLARY: 285 mg/dL — AB (ref 70–99)

## 2014-05-17 LAB — PROTIME-INR
INR: 0.99 (ref 0.00–1.49)
Prothrombin Time: 13.2 seconds (ref 11.6–15.2)

## 2014-05-17 LAB — APTT: APTT: 29 s (ref 24–37)

## 2014-05-17 MED ORDER — CHLORHEXIDINE GLUCONATE 4 % EX LIQD
60.0000 mL | Freq: Once | CUTANEOUS | Status: AC
  Administered 2014-05-18: 4 via TOPICAL
  Filled 2014-05-17: qty 15

## 2014-05-17 MED ORDER — INSULIN ASPART 100 UNIT/ML ~~LOC~~ SOLN
0.0000 [IU] | Freq: Three times a day (TID) | SUBCUTANEOUS | Status: DC
  Administered 2014-05-18: 3 [IU] via SUBCUTANEOUS
  Administered 2014-05-18: 5 [IU] via SUBCUTANEOUS
  Administered 2014-05-18: 3 [IU] via SUBCUTANEOUS
  Administered 2014-05-19: 7 [IU] via SUBCUTANEOUS
  Administered 2014-05-19: 5 [IU] via SUBCUTANEOUS
  Administered 2014-05-19: 7 [IU] via SUBCUTANEOUS
  Administered 2014-05-20 (×3): 2 [IU] via SUBCUTANEOUS
  Administered 2014-05-21: 1 [IU] via SUBCUTANEOUS
  Administered 2014-05-21: 2 [IU] via SUBCUTANEOUS
  Administered 2014-05-22: 5 [IU] via SUBCUTANEOUS
  Administered 2014-05-22 – 2014-05-23 (×3): 2 [IU] via SUBCUTANEOUS

## 2014-05-17 MED ORDER — ZOLPIDEM TARTRATE 5 MG PO TABS
5.0000 mg | ORAL_TABLET | Freq: Every evening | ORAL | Status: DC | PRN
  Administered 2014-05-20: 5 mg via ORAL
  Filled 2014-05-17: qty 1

## 2014-05-17 MED ORDER — VANCOMYCIN HCL IN DEXTROSE 1-5 GM/200ML-% IV SOLN
1000.0000 mg | INTRAVENOUS | Status: AC
  Administered 2014-05-18: 1000 mg via INTRAVENOUS
  Filled 2014-05-17: qty 200

## 2014-05-17 MED ORDER — SODIUM POLYSTYRENE SULFONATE 15 GM/60ML PO SUSP: 15.0000 g | Freq: Every day | ORAL | Status: DC

## 2014-05-17 MED ORDER — ONDANSETRON HCL 4 MG/2ML IJ SOLN
4.0000 mg | Freq: Four times a day (QID) | INTRAMUSCULAR | Status: DC | PRN
  Administered 2014-05-18: 4 mg via INTRAVENOUS
  Filled 2014-05-17: qty 2

## 2014-05-17 MED ORDER — PANTOPRAZOLE SODIUM 40 MG PO TBEC
40.0000 mg | DELAYED_RELEASE_TABLET | Freq: Two times a day (BID) | ORAL | Status: DC
  Administered 2014-05-17 – 2014-05-23 (×9): 40 mg via ORAL
  Filled 2014-05-17 (×10): qty 1

## 2014-05-17 MED ORDER — ATORVASTATIN CALCIUM 40 MG PO TABS: 40.0000 mg | ORAL_TABLET | Freq: Every day | ORAL | Status: DC

## 2014-05-17 MED ORDER — GLIMEPIRIDE 2 MG PO TABS: 4.0000 mg | ORAL_TABLET | Freq: Every day | ORAL | Status: DC

## 2014-05-17 MED ORDER — MORPHINE SULFATE 2 MG/ML IJ SOLN
2.0000 mg | INTRAMUSCULAR | Status: DC | PRN
  Administered 2014-05-17 – 2014-05-18 (×3): 2 mg via INTRAVENOUS
  Filled 2014-05-17 (×3): qty 1

## 2014-05-17 MED ORDER — INSULIN ASPART 100 UNIT/ML ~~LOC~~ SOLN
0.0000 [IU] | Freq: Every day | SUBCUTANEOUS | Status: DC
  Administered 2014-05-17 – 2014-05-18 (×2): 3 [IU] via SUBCUTANEOUS
  Administered 2014-05-19 – 2014-05-22 (×3): 2 [IU] via SUBCUTANEOUS

## 2014-05-17 MED ORDER — ACETAMINOPHEN 650 MG RE SUPP: 650.0000 mg | Freq: Four times a day (QID) | RECTAL | Status: DC | PRN

## 2014-05-17 MED ORDER — ONDANSETRON HCL 4 MG PO TABS: 4.0000 mg | ORAL_TABLET | Freq: Four times a day (QID) | ORAL | Status: DC | PRN

## 2014-05-17 MED ORDER — ACETAMINOPHEN 325 MG PO TABS
650.0000 mg | ORAL_TABLET | Freq: Four times a day (QID) | ORAL | Status: DC | PRN
Start: 2014-05-17 — End: 2014-05-19

## 2014-05-17 MED ORDER — CALCITRIOL 0.25 MCG PO CAPS: 0.2500 ug | ORAL_CAPSULE | Freq: Every day | ORAL | Status: DC

## 2014-05-17 MED ORDER — LOSARTAN POTASSIUM 50 MG PO TABS: 100.0000 mg | ORAL_TABLET | Freq: Every day | ORAL | Status: DC

## 2014-05-17 MED ORDER — METOPROLOL SUCCINATE ER 50 MG PO TB24: 50.0000 mg | ORAL_TABLET | Freq: Every day | ORAL | Status: DC

## 2014-05-17 MED ORDER — FUROSEMIDE 40 MG PO TABS: 40.0000 mg | ORAL_TABLET | Freq: Every day | ORAL | Status: DC

## 2014-05-17 MED ORDER — CLONIDINE HCL 0.2 MG PO TABS
0.2000 mg | ORAL_TABLET | Freq: Two times a day (BID) | ORAL | Status: DC
  Administered 2014-05-17 – 2014-05-21 (×4): 0.2 mg via ORAL
  Filled 2014-05-17 (×6): qty 1

## 2014-05-17 MED ORDER — LEVOTHYROXINE SODIUM 75 MCG PO TABS: 75.0000 ug | ORAL_TABLET | Freq: Every day | ORAL | Status: DC

## 2014-05-17 MED ORDER — ALUM & MAG HYDROXIDE-SIMETH 200-200-20 MG/5ML PO SUSP: 30.0000 mL | Freq: Four times a day (QID) | ORAL | Status: DC | PRN

## 2014-05-17 MED ORDER — GLIMEPIRIDE 2 MG PO TABS: 2.0000 mg | ORAL_TABLET | Freq: Every day | ORAL | Status: DC

## 2014-05-17 MED ORDER — DARBEPOETIN ALFA 100 MCG/0.5ML IJ SOSY
100.0000 ug | PREFILLED_SYRINGE | INTRAMUSCULAR | Status: DC
Start: 2014-05-21 — End: 2014-05-20

## 2014-05-17 MED ORDER — DOCUSATE SODIUM 100 MG PO CAPS: 100.0000 mg | ORAL_CAPSULE | Freq: Every day | ORAL | Status: DC | PRN

## 2014-05-17 NOTE — ED Provider Notes (Signed)
CSN: [id number]     Arrival date & time 05/17/14  1543 History   First MD Initiated Contact with Patient 05/17/14 [phone]     Chief Complaint  Patient presents with  . Fall     (Consider location/radiation/quality/duration/timing/severity/associated sxs/prior Treatment) Patient is a 68 y.o. female presenting with fall. The history is provided by the patient (the pt  fell out of a chair and has right hip pain).  Fall This is a new problem. The current episode started 6 to 12 hours ago. The problem occurs constantly. The problem has not changed since onset.Pertinent negatives include no chest pain, no abdominal pain and no headaches. Nothing (movement) aggravates the symptoms. Nothing relieves the symptoms.    Past Medical History  Diagnosis Date  . Hypertension   . Hyperlipidemia   . GERD (gastroesophageal reflux disease)   . GAD (generalized anxiety disorder)   . GIB (gastrointestinal bleeding)   . CKD (chronic kidney disease), stage III   . Peripheral edema   . Anemia   . Colon polyps   . Coronary artery disease   . Hypothyroidism   . Type II diabetes mellitus   . History of blood transfusion 10/11/2012    "2 units yesterday; first time ever" (10/12/2012)  . Gout   . Anxiety   . Arthritis     knees and hand/fingers   Past Surgical History  Procedure Laterality Date  . Incision and drainage perirectal abscess  ~ 2007  . Retinopathy surgery Bilateral 1980's?  . Orif femur fracture Left 10/09/2012    Procedure: OPEN REDUCTION INTERNAL FIXATION (ORIF) DISTAL FEMUR FRACTURE;  Surgeon: Rozanna Box, MD;  Location: [facility];  Service: Orthopedics;  Laterality: Left;  . Fracture surgery     Family History  Problem Relation Age of Onset  . Colon cancer Father   . Prostate cancer Father   . Diabetes Father   . Coronary artery disease Mother 65  . Heart disease Mother   . Diabetes Mother   . Coronary artery disease Sister 52  . Diabetes Sister   . Heart disease Sister   .  Diabetes Maternal Grandmother   . Diabetes Sister   . Diabetes Sister   . Diabetes Sister   . Diabetes Sister   . Diabetes Sister    History  Substance Use Topics  . Smoking status: Never Smoker   . Smokeless tobacco: Never Used  . Alcohol Use: No   OB History    No data available     Review of Systems  Constitutional: Negative for appetite change and fatigue.  HENT: Negative for congestion, ear discharge and sinus pressure.   Eyes: Negative for discharge.  Respiratory: Negative for cough.   Cardiovascular: Negative for chest pain.  Gastrointestinal: Negative for abdominal pain and diarrhea.  Genitourinary: Negative for frequency and hematuria.  Musculoskeletal: Negative for back pain.       Hip pain on right   Skin: Negative for rash.  Neurological: Negative for seizures and headaches.  Psychiatric/Behavioral: Negative for hallucinations.      Allergies  Aspirin; Ciprofloxacin; Codeine; Micardis; and Rofecoxib  Home Medications   Prior to Admission medications   Medication Sig Start Date End Date Taking? Authorizing Provider  atorvastatin (LIPITOR) 40 MG tablet Take 1 tablet (40 mg total) by mouth daily. 02/14/14  Yes Mary-Margaret Hassell Done, FNP  calcitRIOL (ROCALTROL) 0.25 MCG capsule Take 1 capsule (0.25 mcg total) by mouth daily. 02/16/13  Yes Mary-Margaret Hassell Done, FNP  cloNIDine (CATAPRES) 0.2  MG tablet Take 1 tablet (0.2 mg total) by mouth 2 (two) times daily. 02/14/14  Yes Mary-Margaret Hassell Done, FNP  darbepoetin (ARANESP) 100 MCG/0.5ML SOLN injection Inject 100 mcg into the skin every 5 (five) weeks.   Yes Historical Provider, MD  furosemide (LASIX) 40 MG tablet Take 1 tablet (40 mg total) by mouth daily. 02/14/14  Yes Mary-Margaret Hassell Done, FNP  glimepiride (AMARYL) 4 MG tablet 1 po qam and 1/2 in afternoon Patient taking differently: Take 4 mg by mouth daily. 1 po qam and 1/2 in afternoon 02/14/14  Yes Mary-Margaret Hassell Done, FNP  levothyroxine (SYNTHROID, LEVOTHROID)  75 MCG tablet Take 1 tablet (75 mcg total) by mouth daily before breakfast. 02/14/14  Yes Mary-Margaret Hassell Done, FNP  losartan (COZAAR) 100 MG tablet Take 1 tablet (100 mg total) by mouth daily. 02/14/14  Yes Mary-Margaret Hassell Done, FNP  metoprolol succinate (TOPROL-XL) 50 MG 24 hr tablet Take 1 tablet (50 mg total) by mouth daily. 02/14/14  Yes Mary-Margaret Hassell Done, FNP  pantoprazole (PROTONIX) 40 MG tablet Take 1 tablet (40 mg total) by mouth 2 (two) times daily. 02/14/14  Yes Mary-Margaret Hassell Done, FNP  sodium polystyrene (SPS) 15 GM/60ML suspension Take 60 mLs (15 g total) by mouth once. Patient taking differently: Take 15 g by mouth daily.  02/16/13  Yes Mary-Margaret Hassell Done, FNP  Glucose Blood DISK Use to check BG up to twice a day.  Dx:  250.02 08/09/13   Mary-Margaret Hassell Done, FNP  metoCLOPramide (REGLAN) 5 MG tablet Take 1 tablet (5 mg total) by mouth 4 (four) times daily. Patient not taking: Reported on 05/17/2014 02/14/14   Mary-Margaret Hassell Done, FNP  pioglitazone (ACTOS) 30 MG tablet Take 1 tablet (30 mg total) by mouth daily. Patient not taking: Reported on 05/17/2014 02/14/14   Mary-Margaret Hassell Done, FNP  [employer] (SALINE) SOLN 1 application by Does not apply route daily. Patient not taking: Reported on [phone] [phone]   Leighton Ruff, MD   BP 182/76 mmHg  Pulse 77  Temp(Src) 98.5 F (36.9 C) (Oral)  Resp 18  Ht 5\' 2"  (1.575 m)  Wt 165 lb (74.844 kg)  BMI 30.17 kg/m2  SpO2 100% Physical Exam  Constitutional: She is oriented to person, place, and time. She appears well-developed.  HENT:  Head: Normocephalic.  Eyes: Conjunctivae and EOM are normal. No scleral icterus.  Neck: Neck supple. No thyromegaly present.  Cardiovascular: Normal rate and regular rhythm.  Exam reveals no gallop and no friction rub.   No murmur heard. Pulmonary/Chest: No stridor. She has no wheezes. She has no rales. She exhibits no tenderness.  Abdominal: She exhibits no distension. There is no  tenderness. There is no rebound.  Musculoskeletal: She exhibits no edema.  Tender right hip  Lymphadenopathy:    She has no cervical adenopathy.  Neurological: She is oriented to person, place, and time. She exhibits normal muscle tone. Coordination normal.  Skin: No rash noted. No erythema.  Psychiatric: She has a normal mood and affect. Her behavior is normal.    ED Course  Procedures (including critical care time) Labs Review Labs Reviewed  CBC WITH DIFFERENTIAL - Abnormal; Notable for the following:    Neutrophils Relative % 81 (*)    Neutro Abs 7.9 (*)    All other components within normal limits  COMPREHENSIVE METABOLIC PANEL - Abnormal; Notable for the following:    Glucose, Bld 299 (*)    BUN 34 (*)    Creatinine, Ser 1.54 (*)    GFR calc non Af Wyvonnia Lora  34 (*)    GFR calc Af Amer 39 (*)    All other components within normal limits    Imaging Review Dg Chest 1 View  05/17/2014   CLINICAL DATA:  [place] today trying to get out of chair, RIGHT hip pain/fracture  EXAM: CHEST - 1 VIEW  COMPARISON:  10/06/2012  FINDINGS: Normal heart size, mediastinal contours, and pulmonary vascularity.  Mild chronic elevation of RIGHT diaphragm.  Lungs clear.  No pleural effusion or pneumothorax.  Minimal atherosclerotic calcification aortic arch.  Bones demineralized with costovertebral degenerative changes at RIGHT ninth rib.  IMPRESSION: No acute abnormalities.   Electronically Signed   By: Lavonia Dana M.D.   On: 05/17/2014 16:52   Dg Hip Complete Right  05/17/2014   CLINICAL DATA:  Fall, acute right hip pain  EXAM: RIGHT HIP - COMPLETE 2+ VIEW  COMPARISON:  None.  FINDINGS: There is an acute comminuted right hip intertrochanteric fracture. No associated subluxation or dislocation of the hip joint. Bones are osteopenic. Pelvis appears intact. Degenerative changes of both hips. Normal bowel gas pattern.  IMPRESSION: Acute comminuted right hip intertrochanteric fracture   Electronically Signed   By:  Daryll Brod M.D.   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

## 2014-05-17 NOTE — H&P (Signed)
History and Physical  [name] [id number] DOB: [birth year] DOA: 05/17/2014  Referring physician: Dr Roderic Palau, ED physician PCP: Redge Gainer, MD   Chief Complaint: Right hip pain  HPI: [name] is a 68 y.o. female  With a history of hypertension, and type 2 diabetes with diabetic retinopathy and stage III chronic kidney disease, anemia secondary to chronic kidney disease, hyperlipidemia, depression, hypothyroidism.  She presents to the hospital by EMS due to sudden onset of right hip pain following a fall. The patient was sitting down and her leg gave out when she went to stand up. She fell striking her right hip on the ground. She was unable to place any weight on it although she was not in a lot of pain. She was brought to the emergency department and was found to have a right closed, comminuted hip fracture.   she also has a history of a extensive sacral ulcer. She sees wound care for this. The wound is healing and without signs of infection.  Review of Systems:   Pt denies any fevers, chills, nausea, vomiting, chest pain, palpitations, shortness of breath, dyspnea on exertion, diarrhea, constipation.  Review of systems are otherwise negative  Past Medical History  Diagnosis Date  . Hypertension   . Hyperlipidemia   . GERD (gastroesophageal reflux disease)   . GAD (generalized anxiety disorder)   . GIB (gastrointestinal bleeding)   . CKD (chronic kidney disease), stage III   . Peripheral edema   . Anemia   . Colon polyps   . Coronary artery disease   . Hypothyroidism   . Type II diabetes mellitus   . History of blood transfusion 10/11/2012    "2 units yesterday; first time ever" (10/12/2012)  . Gout   . Anxiety   . Arthritis     knees and hand/fingers   Past Surgical History  Procedure Laterality Date  . Incision and drainage perirectal abscess  ~ 2007  . Retinopathy surgery Bilateral 1980's?  . Orif femur fracture Left 10/09/2012    Procedure: OPEN  REDUCTION INTERNAL FIXATION (ORIF) DISTAL FEMUR FRACTURE;  Surgeon: Rozanna Box, MD;  Location: [place];  Service: Orthopedics;  Laterality: Left;  . Fracture surgery     Social History:  reports that she has never smoked. She has never used smokeless tobacco. She reports that she does not drink alcohol or use illicit drugs.  Patient lives at home with her husband able to participate in activities of daily without assistance   Allergies  Allergen Reactions  . Aspirin     vomiting  . Ciprofloxacin     unknown  . Codeine     Makes me sick   . Micardis [Telmisartan]     unknown  . Rofecoxib     unknown    Family History  Problem Relation Age of Onset  . Colon cancer Father   . Prostate cancer Father   . Diabetes Father   . Coronary artery disease Mother 80  . Heart disease Mother   . Diabetes Mother   . Coronary artery disease Sister 32  . Diabetes Sister   . Heart disease Sister   . Diabetes Maternal Grandmother   . Diabetes Sister   . Diabetes Sister   . Diabetes Sister   . Diabetes Sister   . Diabetes Sister       Prior to Admission medications   Medication Sig Start Date End Date Taking? Authorizing Provider  atorvastatin (LIPITOR) 40 MG tablet  Take 1 tablet (40 mg total) by mouth daily. 02/14/14  Yes Mary-Margaret Hassell Done, FNP  calcitRIOL (ROCALTROL) 0.25 MCG capsule Take 1 capsule (0.25 mcg total) by mouth daily. 02/16/13  Yes Mary-Margaret Hassell Done, FNP  cloNIDine (CATAPRES) 0.2 MG tablet Take 1 tablet (0.2 mg total) by mouth 2 (two) times daily. 02/14/14  Yes Mary-Margaret Hassell Done, FNP  darbepoetin (ARANESP) 100 MCG/0.5ML SOLN injection Inject 100 mcg into the skin every 5 (five) weeks.   Yes Historical Provider, MD  furosemide (LASIX) 40 MG tablet Take 1 tablet (40 mg total) by mouth daily. 02/14/14  Yes Mary-Margaret Hassell Done, FNP  glimepiride (AMARYL) 4 MG tablet 1 po qam and 1/2 in afternoon Patient taking differently: Take 4 mg by mouth daily. 1 po qam and 1/2 in  afternoon 02/14/14  Yes Mary-Margaret Hassell Done, FNP  levothyroxine (SYNTHROID, LEVOTHROID) 75 MCG tablet Take 1 tablet (75 mcg total) by mouth daily before breakfast. 02/14/14  Yes Mary-Margaret Hassell Done, FNP  losartan (COZAAR) 100 MG tablet Take 1 tablet (100 mg total) by mouth daily. 02/14/14  Yes Mary-Margaret Hassell Done, FNP  metoprolol succinate (TOPROL-XL) 50 MG 24 hr tablet Take 1 tablet (50 mg total) by mouth daily. 02/14/14  Yes Mary-Margaret Hassell Done, FNP  pantoprazole (PROTONIX) 40 MG tablet Take 1 tablet (40 mg total) by mouth 2 (two) times daily. 02/14/14  Yes Mary-Margaret Hassell Done, FNP  sodium polystyrene (SPS) 15 GM/60ML suspension Take 60 mLs (15 g total) by mouth once. Patient taking differently: Take 15 g by mouth daily.  02/16/13  Yes Mary-Margaret Hassell Done, FNP  Glucose Blood DISK Use to check BG up to twice a day.  Dx:  250.02 08/09/13   Mary-Margaret Hassell Done, FNP  metoCLOPramide (REGLAN) 5 MG tablet Take 1 tablet (5 mg total) by mouth 4 (four) times daily. Patient not taking: Reported on 05/17/2014 02/14/14   Mary-Margaret Hassell Done, FNP  pioglitazone (ACTOS) 30 MG tablet Take 1 tablet (30 mg total) by mouth daily. Patient not taking: Reported on 05/17/2014 02/14/14   Mary-Margaret Hassell Done, FNP  [employer] (SALINE) SOLN 1 application by Does not apply route daily. Patient not taking: Reported on [phone] [phone]   Leighton Ruff, MD    Physical Exam: BP 182/76 mmHg  Pulse 77  Temp(Src) 98.5 F (36.9 C) (Oral)  Resp 18  Ht 5\' 2"  (1.575 m)  Wt 74.844 kg (165 lb)  BMI 30.17 kg/m2  SpO2 100%  General: elderly Caucasian female. Awake and alert and oriented x3. No acute cardiopulmonary distress.  Eyes: Pupils equal, round, reactive to light. Extraocular muscles are intact. Sclerae anicteric and noninjected.  ENT: Moist mucosal membranes. No mucosal lesions.  Neck: Neck supple without lymphadenopathy. No carotid bruits. No masses palpated.  Cardiovascular: Regular rate with normal S1-S2  sounds. No murmurs, rubs, gallops auscultated. No JVD.  Respiratory: Good respiratory effort with no wheezes, rales, rhonchi. Lungs clear to auscultation bilaterally.  Abdomen: Soft, nontender, nondistended. Active bowel sounds. No masses or hepatosplenomegaly  Skin: Dry, warm to touch. 2+ dorsalis pedis and radial pulses. inspection of the posterior inferior gluteal cleft shows a wound that is approximately 1 cm in width by 1 cm depth and 27 m in length. There is good granulation tissue and no evidence of infection  Musculoskeletal: No calf or leg pain. All major joints not erythematous nontender.  Psychiatric: Intact judgment and insight.  Neurologic: No focal neurological deficits. Cranial nerves II through XII are grossly intact.           Labs on Admission:  Basic Metabolic Panel:  Recent Labs Lab 05/17/14 1658  NA 138  K 5.1  CL 106  CO2 24  GLUCOSE 299*  BUN 34*  CREATININE 1.54*  CALCIUM 9.1   Liver Function Tests:  Recent Labs Lab 05/17/14 1658  AST 20  ALT 11  ALKPHOS 115  BILITOT 0.6  PROT 7.1  ALBUMIN 3.7   No results for input(s): LIPASE, AMYLASE in the last 168 hours. No results for input(s): AMMONIA in the last 168 hours. CBC:  Recent Labs Lab 05/17/14 1658  WBC 9.9  NEUTROABS 7.9*  HGB 12.1  HCT 38.1  MCV 93.6  PLT 163   Cardiac Enzymes: No results for input(s): CKTOTAL, CKMB, CKMBINDEX, TROPONINI in the last 168 hours.  BNP (last 3 results) No results for input(s): PROBNP in the last 8760 hours. CBG: No results for input(s): GLUCAP in the last 168 hours.  Radiological Exams on Admission: Dg Chest 1 View  05/17/2014   CLINICAL DATA:  [place] today trying to get out of chair, RIGHT hip pain/fracture  EXAM: CHEST - 1 VIEW  COMPARISON:  10/06/2012  FINDINGS: Normal heart size, mediastinal contours, and pulmonary vascularity.  Mild chronic elevation of RIGHT diaphragm.  Lungs clear.  No pleural effusion or pneumothorax.  Minimal atherosclerotic  calcification aortic arch.  Bones demineralized with costovertebral degenerative changes at RIGHT ninth rib.  IMPRESSION: No acute abnormalities.   Electronically Signed   By: Lavonia Dana M.D.   On: 05/17/2014 16:52   Dg Hip Complete Right  05/17/2014   CLINICAL DATA:  Fall, acute right hip pain  EXAM: RIGHT HIP - COMPLETE 2+ VIEW  COMPARISON:  None.  FINDINGS: There is an acute comminuted right hip intertrochanteric fracture. No associated subluxation or dislocation of the hip joint. Bones are osteopenic. Pelvis appears intact. Degenerative changes of both hips. Normal bowel gas pattern.  IMPRESSION: Acute comminuted right hip intertrochanteric fracture   Electronically Signed   By: Daryll Brod M.D.   On: 05/17/2014 16:51    EKG: Independently reviewed. Sinus rhythm with a heart rate of 76 bpm. There is right bundle-branch block. There are no ST changes.  Assessment/Plan Present on Admission:  . Closed right hip fracture . Essential hypertension  #1 closed right hip fracture Management per orthopedics. We'll admit the patient to medical floor. Morphine for pain control. Will keep patient nothing by mouth overnight as or so plans to take the patient to surgery tomorrow. We'll hold off on pharmacologic anticoagulation and will keep patient on SCDs.  #2 is essential hypertension. Blood pressure is elevated, likely due to the stress of the situation. Will give patient her evening medications, which will likely decrease her blood pressure to the normal range.  #3 diabetes Continue Amaryl with sliding scale insulin to cover for elevated blood sugars.  #4 hypothyroidism Continue home medications  #5 GERD Continue Protonix  DVT prophylaxis:  SCDs  Consultants:Ortho   Code Status:  Full code   Family Communication:Sister in the room    Disposition Plan: will likely need skilled nursing facility for rehabilitation   Time spent: 50 minutes  Loma Boston, DO [facility] Pager [phone]

## 2014-05-17 NOTE — Progress Notes (Signed)
Patient/Family oriented to room. Information packet given to patient/family. Admission inpatient armband information verified with patient/family to include name and date of birth and placed on patient arm. Side rails up x 2, fall assessment and education completed with patient/family. Call light within reach. Patient/family able to voice and demonstrate understanding of unit orientation instructions  Dan Europe, RN

## 2014-05-17 NOTE — ED Notes (Signed)
Pt was in aseated position, attempted to get out of chair. Pt reports her R leg gave and she fell in the floor. Pt c/o R hip pain. CBG per EMS 240. EKG en route, NSR.

## 2014-05-17 NOTE — ED Notes (Signed)
Report given to floor. Pt ready for transport. 

## 2014-05-18 ENCOUNTER — Inpatient Hospital Stay (HOSPITAL_COMMUNITY): Payer: Medicare Other

## 2014-05-18 ENCOUNTER — Inpatient Hospital Stay (HOSPITAL_COMMUNITY): Payer: Medicare Other | Admitting: Anesthesiology

## 2014-05-18 ENCOUNTER — Encounter (HOSPITAL_COMMUNITY): Payer: Self-pay | Admitting: Orthopedic Surgery

## 2014-05-18 ENCOUNTER — Encounter (HOSPITAL_COMMUNITY)
Admission: EM | Disposition: A | Discharge status: Skilled Nursing Facility | Payer: Self-pay | Source: Home / Self Care | Attending: Family Medicine

## 2014-05-18 DIAGNOSIS — S72009A Fracture of unspecified part of neck of unspecified femur, initial encounter for closed fracture: Secondary | ICD-10-CM | POA: Diagnosis present

## 2014-05-18 DIAGNOSIS — S72141A Displaced intertrochanteric fracture of right femur, initial encounter for closed fracture: Secondary | ICD-10-CM | POA: Insufficient documentation

## 2014-05-18 DIAGNOSIS — E119 Type 2 diabetes mellitus without complications: Secondary | ICD-10-CM

## 2014-05-18 DIAGNOSIS — W19XXXA Unspecified fall, initial encounter: Secondary | ICD-10-CM | POA: Insufficient documentation

## 2014-05-18 DIAGNOSIS — E039 Hypothyroidism, unspecified: Secondary | ICD-10-CM

## 2014-05-18 HISTORY — PX: COMPRESSION HIP SCREW: SHX1386

## 2014-05-18 LAB — CBC
HCT: 32 % — ABNORMAL LOW (ref 36.0–46.0)
Hemoglobin: 10.4 g/dL — ABNORMAL LOW (ref 12.0–15.0)
MCH: 30.2 pg (ref 26.0–34.0)
MCHC: 32.5 g/dL (ref 30.0–36.0)
MCV: 93 fL (ref 78.0–100.0)
PLATELETS: 183 10*3/uL (ref 150–400)
RBC: 3.44 MIL/uL — ABNORMAL LOW (ref 3.87–5.11)
RDW: 13.7 % (ref 11.5–15.5)
WBC: 9.2 10*3/uL (ref 4.0–10.5)

## 2014-05-18 LAB — BASIC METABOLIC PANEL
ANION GAP: 6 (ref 5–15)
BUN: 38 mg/dL — ABNORMAL HIGH (ref 6–23)
CHLORIDE: 108 meq/L (ref 96–112)
CO2: 25 mmol/L (ref 19–32)
CREATININE: 1.61 mg/dL — AB (ref 0.50–1.10)
Calcium: 8.7 mg/dL (ref 8.4–10.5)
GFR, EST AFRICAN AMERICAN: 37 mL/min — AB (ref >90)
GFR, EST NON AFRICAN AMERICAN: 32 mL/min — AB (ref >90)
Glucose, Bld: 234 mg/dL — ABNORMAL HIGH (ref 70–99)
POTASSIUM: 4.7 mmol/L (ref 3.5–5.1)
Sodium: 139 mmol/L (ref 135–145)

## 2014-05-18 LAB — GLUCOSE, CAPILLARY
GLUCOSE-CAPILLARY: 202 mg/dL — AB (ref 70–99)
GLUCOSE-CAPILLARY: 215 mg/dL — AB (ref 70–99)
GLUCOSE-CAPILLARY: 258 mg/dL — AB (ref 70–99)
GLUCOSE-CAPILLARY: 296 mg/dL — AB (ref 70–99)
Glucose-Capillary: 203 mg/dL — ABNORMAL HIGH (ref 70–99)

## 2014-05-18 LAB — PREPARE RBC (CROSSMATCH)

## 2014-05-18 LAB — ABO/RH: ABO/RH(D): O POS

## 2014-05-18 SURGERY — COMPRESSION HIP
Anesthesia: General | Site: Hip | Laterality: Right

## 2014-05-18 MED ORDER — EPHEDRINE SULFATE 50 MG/ML IJ SOLN
INTRAMUSCULAR | Status: AC
  Filled 2014-05-18: qty 1

## 2014-05-18 MED ORDER — EPHEDRINE SULFATE 50 MG/ML IJ SOLN
INTRAMUSCULAR | Status: DC | PRN
  Administered 2014-05-18 (×4): 5 mg via INTRAVENOUS

## 2014-05-18 MED ORDER — GLYCOPYRROLATE 0.2 MG/ML IJ SOLN
INTRAMUSCULAR | Status: AC
  Filled 2014-05-18: qty 2

## 2014-05-18 MED ORDER — METOCLOPRAMIDE HCL 10 MG PO TABS: 5.0000 mg | ORAL_TABLET | Freq: Three times a day (TID) | ORAL | Status: DC | PRN

## 2014-05-18 MED ORDER — METOCLOPRAMIDE HCL 5 MG/ML IJ SOLN
5.0000 mg | Freq: Three times a day (TID) | INTRAMUSCULAR | Status: DC | PRN
  Administered 2014-05-19: 5 mg via INTRAVENOUS
  Administered 2014-05-19 – 2014-05-20 (×2): 10 mg via INTRAVENOUS
  Filled 2014-05-18 (×3): qty 2

## 2014-05-18 MED ORDER — DOCUSATE SODIUM 100 MG PO CAPS
100.0000 mg | ORAL_CAPSULE | Freq: Two times a day (BID) | ORAL | Status: DC
  Filled 2014-05-18: qty 1

## 2014-05-18 MED ORDER — PHENOL 1.4 % MT LIQD: 1.0000 | OROMUCOSAL | Status: DC | PRN

## 2014-05-18 MED ORDER — HYDROCODONE-ACETAMINOPHEN 5-325 MG PO TABS: 1.0000 | ORAL_TABLET | Freq: Four times a day (QID) | ORAL | Status: DC | PRN

## 2014-05-18 MED ORDER — FENTANYL CITRATE 0.05 MG/ML IJ SOLN
INTRAMUSCULAR | Status: AC
  Filled 2014-05-18: qty 2

## 2014-05-18 MED ORDER — SODIUM CHLORIDE 0.9 % IJ SOLN
INTRAMUSCULAR | Status: AC
  Filled 2014-05-18: qty 10

## 2014-05-18 MED ORDER — VANCOMYCIN HCL IN DEXTROSE 1-5 GM/200ML-% IV SOLN
1000.0000 mg | Freq: Two times a day (BID) | INTRAVENOUS | Status: AC
  Administered 2014-05-19: 1000 mg via INTRAVENOUS
  Filled 2014-05-18: qty 200

## 2014-05-18 MED ORDER — SENNA 8.6 MG PO TABS
1.0000 | ORAL_TABLET | Freq: Two times a day (BID) | ORAL | Status: DC
  Filled 2014-05-18: qty 1

## 2014-05-18 MED ORDER — ETOMIDATE 2 MG/ML IV SOLN
INTRAVENOUS | Status: DC | PRN
  Administered 2014-05-18: 16 mg via INTRAVENOUS

## 2014-05-18 MED ORDER — GLYCOPYRROLATE 0.2 MG/ML IJ SOLN
INTRAMUSCULAR | Status: DC | PRN
  Administered 2014-05-18: 0.4 mg via INTRAVENOUS

## 2014-05-18 MED ORDER — ONDANSETRON HCL 4 MG/2ML IJ SOLN
INTRAMUSCULAR | Status: DC | PRN
  Administered 2014-05-18 (×2): 4 mg via INTRAVENOUS

## 2014-05-18 MED ORDER — ONDANSETRON HCL 4 MG/2ML IJ SOLN
INTRAMUSCULAR | Status: AC
  Filled 2014-05-18: qty 6

## 2014-05-18 MED ORDER — PROPOFOL 10 MG/ML IV EMUL
INTRAVENOUS | Status: AC
  Filled 2014-05-18: qty 20

## 2014-05-18 MED ORDER — GLYCOPYRROLATE 0.2 MG/ML IJ SOLN
INTRAMUSCULAR | Status: AC
Start: 2014-05-18 — End: 2014-05-18
  Filled 2014-05-18: qty 1

## 2014-05-18 MED ORDER — ALBUTEROL SULFATE (2.5 MG/3ML) 0.083% IN NEBU
2.5000 mg | INHALATION_SOLUTION | Freq: Four times a day (QID) | RESPIRATORY_TRACT | Status: DC
  Administered 2014-05-18: 2.5 mg via RESPIRATORY_TRACT
  Filled 2014-05-18: qty 3

## 2014-05-18 MED ORDER — ONDANSETRON HCL 4 MG/2ML IJ SOLN
INTRAMUSCULAR | Status: AC
  Filled 2014-05-18: qty 2

## 2014-05-18 MED ORDER — VANCOMYCIN HCL IN DEXTROSE 1-5 GM/200ML-% IV SOLN
1000.0000 mg | INTRAVENOUS | Status: DC
  Filled 2014-05-18: qty 200

## 2014-05-18 MED ORDER — SODIUM CHLORIDE 0.9 % IV SOLN
INTRAVENOUS | Status: DC | PRN
  Administered 2014-05-18: 12:00:00 via INTRAVENOUS

## 2014-05-18 MED ORDER — BISACODYL 5 MG PO TBEC: 5.0000 mg | DELAYED_RELEASE_TABLET | Freq: Every day | ORAL | Status: DC | PRN

## 2014-05-18 MED ORDER — SUCCINYLCHOLINE CHLORIDE 20 MG/ML IJ SOLN
INTRAMUSCULAR | Status: AC
  Filled 2014-05-18: qty 1

## 2014-05-18 MED ORDER — MIDAZOLAM HCL 2 MG/2ML IJ SOLN
INTRAMUSCULAR | Status: AC
  Filled 2014-05-18: qty 2

## 2014-05-18 MED ORDER — HYDROMORPHONE HCL 1 MG/ML IJ SOLN
0.5000 mg | INTRAMUSCULAR | Status: DC | PRN
Start: 2014-05-18 — End: 2014-05-19
  Administered 2014-05-18 (×2): 0.5 mg via INTRAVENOUS
  Filled 2014-05-18 (×2): qty 1

## 2014-05-18 MED ORDER — GLYCOPYRROLATE 0.2 MG/ML IJ SOLN
INTRAMUSCULAR | Status: AC
  Filled 2014-05-18: qty 1

## 2014-05-18 MED ORDER — ENOXAPARIN SODIUM 30 MG/0.3ML ~~LOC~~ SOLN
30.0000 mg | SUBCUTANEOUS | Status: DC
  Administered 2014-05-19: 30 mg via SUBCUTANEOUS
  Filled 2014-05-18: qty 0.3

## 2014-05-18 MED ORDER — LIDOCAINE HCL (PF) 1 % IJ SOLN
INTRAMUSCULAR | Status: AC
  Filled 2014-05-18: qty 5

## 2014-05-18 MED ORDER — ROCURONIUM BROMIDE 50 MG/5ML IV SOLN
INTRAVENOUS | Status: AC
  Filled 2014-05-18: qty 1

## 2014-05-18 MED ORDER — DEXAMETHASONE SODIUM PHOSPHATE 4 MG/ML IJ SOLN
INTRAMUSCULAR | Status: AC
  Filled 2014-05-18: qty 3

## 2014-05-18 MED ORDER — SODIUM CHLORIDE 0.9 % IR SOLN
Status: DC | PRN
  Administered 2014-05-18: 1000 mL

## 2014-05-18 MED ORDER — ROCURONIUM BROMIDE 100 MG/10ML IV SOLN
INTRAVENOUS | Status: DC | PRN
  Administered 2014-05-18: 15 mg via INTRAVENOUS
  Administered 2014-05-18: 10 mg via INTRAVENOUS
  Administered 2014-05-18: 5 mg via INTRAVENOUS

## 2014-05-18 MED ORDER — MORPHINE SULFATE 2 MG/ML IJ SOLN: 0.5000 mg | INTRAMUSCULAR | Status: DC | PRN

## 2014-05-18 MED ORDER — SUCCINYLCHOLINE CHLORIDE 20 MG/ML IJ SOLN
INTRAMUSCULAR | Status: AC
  Filled 2014-05-18: qty 2

## 2014-05-18 MED ORDER — BUPIVACAINE-EPINEPHRINE (PF) 0.5% -1:200000 IJ SOLN
INTRAMUSCULAR | Status: AC
Start: 2014-05-18 — End: 2014-05-18
  Filled 2014-05-18: qty 60

## 2014-05-18 MED ORDER — MAGNESIUM CITRATE PO SOLN: 1.0000 | Freq: Once | ORAL | Status: AC | PRN

## 2014-05-18 MED ORDER — METHOCARBAMOL 1000 MG/10ML IJ SOLN
500.0000 mg | Freq: Four times a day (QID) | INTRAVENOUS | Status: DC | PRN
  Filled 2014-05-18: qty 5

## 2014-05-18 MED ORDER — NEOSTIGMINE METHYLSULFATE 10 MG/10ML IV SOLN
INTRAVENOUS | Status: DC | PRN
  Administered 2014-05-18: 2 mg via INTRAVENOUS

## 2014-05-18 MED ORDER — SODIUM CHLORIDE 0.9 % IV SOLN
INTRAVENOUS | Status: DC
  Administered 2014-05-18 – 2014-05-19 (×2): via INTRAVENOUS

## 2014-05-18 MED ORDER — MIDAZOLAM HCL 5 MG/5ML IJ SOLN
INTRAMUSCULAR | Status: DC | PRN
  Administered 2014-05-18 (×4): 0.5 mg via INTRAVENOUS

## 2014-05-18 MED ORDER — MENTHOL 3 MG MT LOZG: 1.0000 | LOZENGE | OROMUCOSAL | Status: DC | PRN

## 2014-05-18 MED ORDER — BUPIVACAINE-EPINEPHRINE (PF) 0.5% -1:200000 IJ SOLN
INTRAMUSCULAR | Status: DC | PRN
  Administered 2014-05-18: 60 mL via PERINEURAL

## 2014-05-18 MED ORDER — NEOSTIGMINE METHYLSULFATE 10 MG/10ML IV SOLN
INTRAVENOUS | Status: AC
  Filled 2014-05-18: qty 1

## 2014-05-18 MED ORDER — FENTANYL CITRATE 0.05 MG/ML IJ SOLN
INTRAMUSCULAR | Status: DC | PRN
  Administered 2014-05-18: 50 ug via INTRAVENOUS
  Administered 2014-05-18 (×6): 25 ug via INTRAVENOUS

## 2014-05-18 MED ORDER — PROMETHAZINE HCL 25 MG/ML IJ SOLN
12.5000 mg | Freq: Four times a day (QID) | INTRAMUSCULAR | Status: DC | PRN
  Administered 2014-05-18 – 2014-05-19 (×2): 12.5 mg via INTRAVENOUS
  Filled 2014-05-18 (×4): qty 1

## 2014-05-18 MED ORDER — METHOCARBAMOL 500 MG PO TABS: 500.0000 mg | ORAL_TABLET | Freq: Four times a day (QID) | ORAL | Status: DC | PRN

## 2014-05-18 MED ORDER — SUCCINYLCHOLINE CHLORIDE 20 MG/ML IJ SOLN
INTRAMUSCULAR | Status: DC | PRN
  Administered 2014-05-18: 120 mg via INTRAVENOUS

## 2014-05-18 MED ORDER — ETOMIDATE 2 MG/ML IV SOLN
INTRAVENOUS | Status: AC
  Filled 2014-05-18: qty 10

## 2014-05-18 MED ORDER — ROCURONIUM BROMIDE 50 MG/5ML IV SOLN
INTRAVENOUS | Status: AC
  Filled 2014-05-18: qty 2

## 2014-05-18 MED ORDER — POLYETHYLENE GLYCOL 3350 17 G PO PACK: 17.0000 g | PACK | Freq: Every day | ORAL | Status: DC

## 2014-05-18 SURGICAL SUPPLY — 52 items
3.2MM X 230MM CALIBRATED GUIDE PIN ×3 IMPLANT
BAG HAMPER (MISCELLANEOUS) ×3 IMPLANT
BIT DRILL TWIST 3.5MM (BIT) ×1 IMPLANT
BLADE SURG SZ10 CARB STEEL (BLADE) ×6 IMPLANT
CLOTH BEACON ORANGE TIMEOUT ST (SAFETY) ×3 IMPLANT
COVER LIGHT HANDLE STERIS (MISCELLANEOUS) ×6 IMPLANT
COVER MAYO STAND XLG (DRAPE) ×3 IMPLANT
DRAPE STERI IOBAN 125X83 (DRAPES) ×3 IMPLANT
DRESSING MEPILEX BORDER 6X8 (GAUZE/BANDAGES/DRESSINGS) ×1 IMPLANT
DRILL TWIST 3.5MM (BIT) ×3
DRSG MEPILEX BORDER 4X12 (GAUZE/BANDAGES/DRESSINGS) ×3 IMPLANT
DRSG MEPILEX BORDER 6X8 (GAUZE/BANDAGES/DRESSINGS) ×3
ELECT REM PT RETURN 9FT ADLT (ELECTROSURGICAL) ×3
ELECTRODE REM PT RTRN 9FT ADLT (ELECTROSURGICAL) ×1 IMPLANT
GLOVE BIOGEL M 7.0 STRL (GLOVE) ×3 IMPLANT
GLOVE BIOGEL PI IND STRL 7.0 (GLOVE) ×2 IMPLANT
GLOVE BIOGEL PI INDICATOR 7.0 (GLOVE) ×4
GLOVE EXAM NITRILE MD LF STRL (GLOVE) ×3 IMPLANT
GLOVE SKINSENSE NS SZ8.0 LF (GLOVE) ×2
GLOVE SKINSENSE STRL SZ8.0 LF (GLOVE) ×1 IMPLANT
GLOVE SS N UNI LF 8.5 STRL (GLOVE) ×3 IMPLANT
GOWN STRL REUS W/TWL LRG LVL3 (GOWN DISPOSABLE) ×6 IMPLANT
GOWN STRL REUS W/TWL XL LVL3 (GOWN DISPOSABLE) ×3 IMPLANT
GUIDE PIN CALIBRATED (PIN) ×6 IMPLANT
INST SET MAJOR BONE (KITS) ×3 IMPLANT
KIT BLADEGUARD II DBL (SET/KITS/TRAYS/PACK) ×3 IMPLANT
KIT ROOM TURNOVER AP CYSTO (KITS) ×3 IMPLANT
MANIFOLD NEPTUNE II (INSTRUMENTS) ×3 IMPLANT
MARKER SKIN DUAL TIP RULER LAB (MISCELLANEOUS) ×3 IMPLANT
NS IRRIG 1000ML POUR BTL (IV SOLUTION) ×3 IMPLANT
PACK BASIC III (CUSTOM PROCEDURE TRAY) ×2
PACK SRG BSC III STRL LF ECLPS (CUSTOM PROCEDURE TRAY) ×1 IMPLANT
PAD ABD 5X9 TENDERSORB (GAUZE/BANDAGES/DRESSINGS) ×6 IMPLANT
PAD ARMBOARD 7.5X6 YLW CONV (MISCELLANEOUS) ×6 IMPLANT
PENCIL HANDSWITCHING (ELECTRODE) ×3 IMPLANT
PLATE 135X4 HOLE (Plate) ×3 IMPLANT
SCREW COMPRESSION 19.0M (Screw) ×3 IMPLANT
SCREW CORTICAL SFTP 4.5X34MM (Screw) ×6 IMPLANT
SCREW CORTICAL SFTP 4.5X36MM (Screw) ×3 IMPLANT
SCREW CORTICAL SFTP 4.5X38MM (Screw) ×3 IMPLANT
SCREW LAG 95MM (Screw) ×2 IMPLANT
SCREW LAGSTD 95X21X12.7X9 (Screw) ×1 IMPLANT
SET BASIN LINEN APH (SET/KITS/TRAYS/PACK) ×3 IMPLANT
SPONGE LAP 18X18 X RAY DECT (DISPOSABLE) ×9 IMPLANT
STAPLER VISISTAT 35W (STAPLE) ×3 IMPLANT
SUT ETHIBOND 5 LR DA (SUTURE) ×3 IMPLANT
SUT MNCRL 0 VIOLET CTX 36 (SUTURE) ×2 IMPLANT
SUT MON AB 2-0 CT1 36 (SUTURE) ×3 IMPLANT
SUT MONOCRYL 0 CTX 36 (SUTURE) ×4
SYR BULB IRRIGATION 50ML (SYRINGE) ×6 IMPLANT
YANKAUER SUCT 12FT TUBE ARGYLE (SUCTIONS) ×3 IMPLANT
YANKAUER SUCT BULB TIP 10FT TU (MISCELLANEOUS) ×3 IMPLANT

## 2014-05-18 NOTE — Progress Notes (Signed)
Awake. Mouth care done.

## 2014-05-18 NOTE — Consult Note (Signed)
Reason for Consult:right hip fracture  Referring Physician: Dr Ronny Bacon is an 68 y.o. female.  HPI: [name] yo femal s/p left femur ORIF stood up from a chair and right leg gave way, fell fractured right hip, could not walk; she c/o severe sharp then deep dull aching non radiating right hip and thigh pain. Brought to [facility] by EMS. Evaluation reveals a comminuted right hip peritrochanteric unstable fracture with greater trochanter and part of proximal lateral femur involved.  Past Medical History  Diagnosis Date  . Hypertension   . Hyperlipidemia   . GERD (gastroesophageal reflux disease)   . GAD (generalized anxiety disorder)   . GIB (gastrointestinal bleeding)   . CKD (chronic kidney disease), stage III   . Peripheral edema   . Anemia   . Colon polyps   . Coronary artery disease   . Hypothyroidism   . Type II diabetes mellitus   . History of blood transfusion 10/11/2012    "2 units yesterday; first time ever" (10/12/2012)  . Gout   . Anxiety   . Arthritis     knees and hand/fingers    Past Surgical History  Procedure Laterality Date  . Incision and drainage perirectal abscess  ~ 2007  . Retinopathy surgery Bilateral 1980's?  . Orif femur fracture Left 10/09/2012    Procedure: OPEN REDUCTION INTERNAL FIXATION (ORIF) DISTAL FEMUR FRACTURE;  Surgeon: Rozanna Box, MD;  Location: [place];  Service: Orthopedics;  Laterality: Left;  . Fracture surgery      Family History  Problem Relation Age of Onset  . Colon cancer Father   . Prostate cancer Father   . Diabetes Father   . Coronary artery disease Mother 40  . Heart disease Mother   . Diabetes Mother   . Coronary artery disease Sister 2  . Diabetes Sister   . Heart disease Sister   . Diabetes Maternal Grandmother   . Diabetes Sister   . Diabetes Sister   . Diabetes Sister   . Diabetes Sister   . Diabetes Sister     Social History:  reports that she has never smoked. She has never used smokeless tobacco. She  reports that she does not drink alcohol or use illicit drugs.  Allergies:  Allergies  Allergen Reactions  . Aspirin     vomiting  . Ciprofloxacin     unknown  . Codeine     Makes me sick   . Micardis [Telmisartan]     unknown  . Rofecoxib     unknown    Medications: I have reviewed the patient's current medications.  Results for orders placed or performed during the hospital encounter of 05/17/14 (from the past 48 hour(s))  CBC with Differential     Status: Abnormal   Collection Time: 05/17/14  4:58 PM  Result Value Ref Range   WBC 9.9 4.0 - 10.5 K/uL   RBC 4.07 3.87 - 5.11 MIL/uL   Hemoglobin 12.1 12.0 - 15.0 g/dL   HCT 38.1 36.0 - 46.0 %   MCV 93.6 78.0 - 100.0 fL   MCH 29.7 26.0 - [name].0 pg   MCHC 31.8 30.0 - 36.0 g/dL   RDW 13.6 11.5 - 15.5 %   Platelets 163 150 - 400 K/uL   Neutrophils Relative % 81 (H) 43 - 77 %   Neutro Abs 7.9 (H) 1.7 - 7.7 K/uL   Lymphocytes Relative 13 12 - 46 %   Lymphs Abs 1.3 0.7 - 4.0  K/uL   Monocytes Relative 5 3 - 12 %   Monocytes Absolute 0.5 0.1 - 1.0 K/uL   Eosinophils Relative 1 0 - 5 %   Eosinophils Absolute 0.1 0.0 - 0.7 K/uL   Basophils Relative 0 0 - 1 %   Basophils Absolute 0.0 0.0 - 0.1 K/uL  Comprehensive metabolic panel     Status: Abnormal   Collection Time: 05/17/14  4:58 PM  Result Value Ref Range   Sodium 138 135 - 145 mmol/L    Comment: Please note change in reference range.   Potassium 5.1 3.5 - 5.1 mmol/L    Comment: Please note change in reference range.   Chloride 106 96 - 112 mEq/L   CO2 24 19 - 32 mmol/L   Glucose, Bld 299 (H) 70 - 99 mg/dL   BUN [name] (H) 6 - 23 mg/dL   Creatinine, Ser 1.54 (H) 0.50 - 1.10 mg/dL   Calcium 9.1 8.4 - 10.5 mg/dL   Total Protein 7.1 6.0 - 8.3 g/dL   Albumin 3.7 3.5 - 5.2 g/dL   AST 20 0 - 37 U/L   ALT 11 0 - 35 U/L   Alkaline Phosphatase 115 39 - 117 U/L   Total Bilirubin 0.6 0.3 - 1.2 mg/dL   GFR calc non Af Amer [name] (L) >90 mL/min   GFR calc Af Amer 39 (L) >90 mL/min     Comment: (NOTE) The eGFR has been calculated using the CKD EPI equation. This calculation has not been validated in all clinical situations. eGFR's persistently <90 mL/min signify possible Chronic Kidney Disease.    Anion gap 8 5 - 15  Glucose, capillary     Status: Abnormal   Collection Time: 05/17/14  9:06 PM  Result Value Ref Range   Glucose-Capillary 285 (H) 70 - 99 mg/dL   Comment 1 Notify RN   CBC WITH DIFFERENTIAL     Status: Abnormal   Collection Time: 05/17/14  9:25 PM  Result Value Ref Range   WBC 11.1 (H) 4.0 - 10.5 K/uL   RBC 3.79 (L) 3.87 - 5.11 MIL/uL   Hemoglobin 11.4 (L) 12.0 - 15.0 g/dL   HCT 35.2 (L) 36.0 - 46.0 %   MCV 92.9 78.0 - 100.0 fL   MCH 30.1 26.0 - [name].0 pg   MCHC 32.4 30.0 - 36.0 g/dL   RDW 13.6 11.5 - 15.5 %   Platelets 172 150 - 400 K/uL   Neutrophils Relative % 86 (H) 43 - 77 %   Neutro Abs 9.6 (H) 1.7 - 7.7 K/uL   Lymphocytes Relative 9 (L) 12 - 46 %   Lymphs Abs 1.0 0.7 - 4.0 K/uL   Monocytes Relative 5 3 - 12 %   Monocytes Absolute 0.5 0.1 - 1.0 K/uL   Eosinophils Relative 0 0 - 5 %   Eosinophils Absolute 0.0 0.0 - 0.7 K/uL   Basophils Relative 0 0 - 1 %   Basophils Absolute 0.0 0.0 - 0.1 K/uL  Comprehensive metabolic panel     Status: Abnormal   Collection Time: 05/17/14  9:25 PM  Result Value Ref Range   Sodium 137 135 - 145 mmol/L    Comment: Please note change in reference range.   Potassium 5.2 (H) 3.5 - 5.1 mmol/L    Comment: Please note change in reference range.   Chloride 106 96 - 112 mEq/L   CO2 24 19 - 32 mmol/L   Glucose, Bld 302 (H) 70 - 99 mg/dL  BUN 35 (H) 6 - 23 mg/dL   Creatinine, Ser 1.58 (H) 0.50 - 1.10 mg/dL   Calcium 9.0 8.4 - 10.5 mg/dL   Total Protein 6.8 6.0 - 8.3 g/dL   Albumin 3.5 3.5 - 5.2 g/dL   AST 19 0 - 37 U/L   ALT 11 0 - 35 U/L   Alkaline Phosphatase 101 39 - 117 U/L   Total Bilirubin 0.6 0.3 - 1.2 mg/dL   GFR calc non Af Amer 33 (L) >90 mL/min   GFR calc Af Amer 38 (L) >90 mL/min    Comment:  (NOTE) The eGFR has been calculated using the CKD EPI equation. This calculation has not been validated in all clinical situations. eGFR's persistently <90 mL/min signify possible Chronic Kidney Disease.    Anion gap 7 5 - 15  Protime-INR     Status: [name]   Collection Time: 05/17/14  9:25 PM  Result Value Ref Range   Prothrombin Time 13.2 11.6 - 15.2 seconds   INR 0.99 0.00 - 1.49  APTT     Status: [name]   Collection Time: 05/17/14  9:25 PM  Result Value Ref Range   aPTT 29 24 - 37 seconds  Prepare RBC (crossmatch)     Status: [name]   Collection Time: 05/17/14  9:25 PM  Result Value Ref Range   Order Confirmation ORDER PROCESSED BY BLOOD BANK   Type and screen     Status: [name] (Preliminary result)   Collection Time: 05/17/14  9:25 PM  Result Value Ref Range   ABO/RH(D) O POS    Antibody Screen NEG    Sample Expiration 05/20/2014    Unit Number [id number]    Blood Component Type RED CELLS,LR    Unit division 00    Status of Unit ALLOCATED    Transfusion Status OK TO TRANSFUSE    Crossmatch Result Compatible    Unit Number [id number]    Blood Component Type RED CELLS,LR    Unit division 00    Status of Unit ALLOCATED    Transfusion Status OK TO TRANSFUSE    Crossmatch Result Compatible    Unit Number [id number]    Blood Component Type RED CELLS,LR    Unit division 00    Status of Unit ALLOCATED    Transfusion Status OK TO TRANSFUSE    Crossmatch Result Compatible   ABO/Rh     Status: [name] (Preliminary result)   Collection Time: 05/17/14  9:25 PM  Result Value Ref Range   ABO/RH(D) O POS   CBC     Status: Abnormal   Collection Time: 05/18/14  5:38 AM  Result Value Ref Range   WBC 9.2 4.0 - 10.5 K/uL   RBC 3.44 (L) 3.87 - 5.11 MIL/uL   Hemoglobin 10.4 (L) 12.0 - 15.0 g/dL   HCT 32.0 (L) 36.0 - 46.0 %   MCV 93.0 78.0 - 100.0 fL   MCH 30.2 26.0 - [name].0 pg   MCHC 32.5 30.0 - 36.0 g/dL   RDW 13.7 11.5 - 15.5 %   Platelets 183 150 - 400 K/uL  Basic metabolic  panel     Status: Abnormal   Collection Time: 05/18/14  5:38 AM  Result Value Ref Range   Sodium 139 135 - 145 mmol/L    Comment: Please note change in reference range.   Potassium 4.7 3.5 - 5.1 mmol/L    Comment: Please note change in reference range.   Chloride 108 96 - 112 mEq/L  CO2 25 19 - 32 mmol/L   Glucose, Bld 234 (H) 70 - 99 mg/dL   BUN 38 (H) 6 - 23 mg/dL   Creatinine, Ser 1.61 (H) 0.50 - 1.10 mg/dL   Calcium 8.7 8.4 - 10.5 mg/dL   GFR calc non Af Amer 32 (L) >90 mL/min   GFR calc Af Amer 37 (L) >90 mL/min    Comment: (NOTE) The eGFR has been calculated using the CKD EPI equation. This calculation has not been validated in all clinical situations. eGFR's persistently <90 mL/min signify possible Chronic Kidney Disease.    Anion gap 6 5 - 15  Glucose, capillary     Status: Abnormal   Collection Time: 05/18/14  7:[name] AM  Result Value Ref Range   Glucose-Capillary 203 (H) 70 - 99 mg/dL    Dg Chest 1 View  05/17/2014   CLINICAL DATA:  [place] today trying to get out of chair, RIGHT hip pain/fracture  EXAM: CHEST - 1 VIEW  COMPARISON:  10/06/2012  FINDINGS: Normal heart size, mediastinal contours, and pulmonary vascularity.  Mild chronic elevation of RIGHT diaphragm.  Lungs clear.  No pleural effusion or pneumothorax.  Minimal atherosclerotic calcification aortic arch.  Bones demineralized with costovertebral degenerative changes at RIGHT ninth rib.  IMPRESSION: No acute abnormalities.   Electronically Signed   By: Lavonia Dana M.D.   On: 05/17/2014 16:52   Dg Hip Complete Right  05/17/2014   CLINICAL DATA:  Fall, acute right hip pain  EXAM: RIGHT HIP - COMPLETE 2+ VIEW  COMPARISON:  [name].  FINDINGS: There is an acute comminuted right hip intertrochanteric fracture. No associated subluxation or dislocation of the hip joint. Bones are osteopenic. Pelvis appears intact. Degenerative changes of both hips. Normal bowel gas pattern.  IMPRESSION: Acute comminuted right hip  intertrochanteric fracture   Electronically Signed   By: Daryll Brod M.D.   On: 05/17/2014 16:51    ROS she has been feeling well lately  ROS normal;  Blood pressure 130/50, pulse 72, temperature 98.3 F (36.8 C), temperature source Oral, resp. rate 18, height 5' 2" (1.575 m), weight 192 lb 3.2 oz (87.181 kg), SpO2 98 %. Physical Exam  PHYSICAL EXAM  VITAL SIGNS: BP 130/50 mmHg  Pulse 72  Temp(Src) 98.3 F (36.8 C) (Oral)  Resp 18  Ht 5' 2" (1.575 m)  Wt 192 lb 3.2 oz (87.181 kg)  BMI 35.14 kg/m2  SpO2 98%   GENERAL moderate obesity  MENTAL STATUS normal   MOOD/AFFECT ARE NORMAL   GAIT cant ambulate   (all 4 extremities) EXAM OF THE upper extremities is normal and left lower extremit reveals incisions healed normally around the femur. Leg appears to lie in external rotation-seems her normal alignment, no other abnormalities  Right leg externally rotated oddly matches the left lower extremity as well rotated; painful range of motion tenderness proximal femur. Neurovascular exam is intact. Muscle tone is normal. Joints look reduced.  VASC normal perfusion color 4  NEURO normal sensation reflexes deferred  LYMPH negative 4  SKIN normal 4  I  Assessment/Plan: Peritrochanteric fracture right hip. The greater trochanter is fractured with extension of the fragment into the lateral proximal femur. This will make the fixation choices somewhat difficult. Nailing probably not work based on the trochanteric fracture however an anatomic hip screw may be difficult as well due to the lateral wall extension of the greater trochanteric piece. However, we will start with the dynamic hip screw and try to bring the greater  trochanteric piece down and fixated with tension band.  Open treatment internal fixation right hip.  Certain risk factors, were discussed with the patient which included but were not limited to bleeding infection blood clots limb shortening.  Arther Abbott 05/18/2014, 9:56 AM

## 2014-05-18 NOTE — Progress Notes (Signed)
Heels elevated off bed on a pillow.

## 2014-05-18 NOTE — Transfer of Care (Signed)
Immediate Anesthesia Transfer of Care Note  Patient: [name]  Procedure(s) Performed: Procedure(s): COMPRESSION HIP (Right)  Patient Location: PACU  Anesthesia Type:General  Level of Consciousness: awake, alert  and patient cooperative  Airway & Oxygen Therapy: Patient Spontanous Breathing and non-rebreather face mask  Post-op Assessment: Report given to PACU RN, Post -op Vital signs reviewed and stable and Patient moving all extremities  Post vital signs: Reviewed and stable  Complications: No apparent anesthesia complications

## 2014-05-18 NOTE — Anesthesia Preprocedure Evaluation (Addendum)
Anesthesia Evaluation  Patient identified by MRN, date of birth, ID band Patient awake    Reviewed: Allergy & Precautions, H&P , NPO status , Patient's Chart, lab work & pertinent test results, reviewed documented beta blocker date and time   History of Anesthesia Complications (+) DIFFICULT IV STICK / SPECIAL LINENegative for: history of anesthetic complications  Airway Mallampati: II  TM Distance: >3 FB     Dental  (+) Poor Dentition, Dental Advisory Given, Teeth Intact   Pulmonary neg pulmonary ROS,    Pulmonary exam normal       Cardiovascular Exercise Tolerance: Poor hypertension, Pt. on medications and Pt. on home beta blockers + CAD Rhythm:Regular Rate:Normal  Nl ef echo 01/2011   Neuro/Psych PSYCHIATRIC DISORDERS Anxiety Depression negative neurological ROS     GI/Hepatic Neg liver ROS, GERD-  Medicated and Controlled,  Endo/Other  diabetes, Well Controlled, Type 2, Oral Hypoglycemic AgentsHypothyroidism   Renal/GU Renal InsufficiencyRenal disease     Musculoskeletal  (+) Arthritis -, Osteoarthritis,    Abdominal   Peds  Hematology negative hematology ROS (+) anemia ,   Anesthesia Other Findings   Reproductive/Obstetrics                            Anesthesia Physical Anesthesia Plan  ASA: III and emergent  Anesthesia Plan: General   Post-op Pain Management:    Induction:   Airway Management Planned: Oral ETT and Video Laryngoscope Planned  Additional Equipment:   Intra-op Plan:   Post-operative Plan: Extubation in OR and Possible Post-op intubation/ventilation  Informed Consent:   Dental advisory given  Plan Discussed with: Anesthesiologist and Surgeon  Anesthesia Plan Comments:        Anesthesia Quick Evaluation

## 2014-05-18 NOTE — Progress Notes (Addendum)
Pt was receiving 2 mg of morphine every 2 hours, but was complaining of it not being strong enough. Dr. Hal Hope notified and  called to give a telephone order for 0.5 mg of Dilaudid every 2 hours as needed. He also wanted to discontinue the morphine. George Hugh D, RN

## 2014-05-18 NOTE — Anesthesia Procedure Notes (Signed)
Procedure Name: Intubation Date/Time: 05/18/2014 12:30 PM Performed by: Vista Deck Pre-anesthesia Checklist: Patient identified, Patient being monitored, Timeout performed, Emergency Drugs available and Suction available Patient Re-evaluated:Patient Re-evaluated prior to inductionOxygen Delivery Method: Circle System Utilized Preoxygenation: Pre-oxygenation with 100% oxygen Intubation Type: IV induction, Rapid sequence and Cricoid Pressure applied Ventilation: Oral airway inserted - appropriate to patient size Grade View: Grade I Tube type: Oral Tube size: 7.0 mm Number of attempts: 1 Airway Equipment and Method: stylet and Video-laryngoscopy Placement Confirmation: ETT inserted through vocal cords under direct vision,  positive ETCO2 and breath sounds checked- equal and bilateral Secured at: 20 cm Tube secured with: Tape Dental Injury: Teeth and Oropharynx as per pre-operative assessment

## 2014-05-18 NOTE — Progress Notes (Signed)
Emesis of 25 ml of clear mucus.

## 2014-05-18 NOTE — Brief Op Note (Signed)
05/17/2014 - 05/18/2014  2:36 PM  PATIENT:  [name]  68 y.o. female  PRE-OPERATIVE DIAGNOSIS:  right hip peritrochanteric fracture  POST-OPERATIVE DIAGNOSIS:  right hip peritrochanteric fracture  Smith/nephew dhs 135 4-hole, 95 mm lag, compression screw, tension band suture fixation greater troch  PROCEDURE:  Procedure(s): COMPRESSION HIP (Right)  SURGEON:  Surgeon(s) and Role:    * Carole Civil, MD - Primary  PHYSICIAN ASSISTANT:   ASSISTANTS: none   ANESTHESIA:   general  EBL:  Total I/O In: 300 [I.V.:300] Out: 525 [Urine:225; Blood:300]  BLOOD ADMINISTERED:none  DRAINS: none   LOCAL MEDICATIONS USED:  MARCAINE     SPECIMEN:  No Specimen  DISPOSITION OF SPECIMEN:  N/A  COUNTS:  YES  TOURNIQUET:  * No tourniquets in log *  DICTATION: .Dragon Dictation  PLAN OF CARE: Admit to inpatient   PATIENT DISPOSITION:  PACU - hemodynamically stable.   Delay start of Pharmacological VTE agent (>24hrs) due to surgical blood loss or risk of bleeding: yes

## 2014-05-18 NOTE — Anesthesia Postprocedure Evaluation (Addendum)
  Anesthesia Post-op Note  Patient: [name]  Procedure(s) Performed: Procedure(s): COMPRESSION HIP (Right)  Patient Location: PACU  Anesthesia Type:General  Level of Consciousness: awake, alert  and patient cooperative  Airway and Oxygen Therapy: Patient Spontanous Breathing and Patient connected to nasal cannula oxygen  Post-op Pain: mild  Post-op Assessment: Post-op Vital signs reviewed, Patient's Cardiovascular Status Stable, Respiratory Function Stable and Patent Airway. Nausea resolving post Zofran administration.  Post-op Vital Signs: Reviewed and stable   Complications: No apparent anesthesia complications

## 2014-05-18 NOTE — Progress Notes (Signed)
[facility] PROGRESS NOTE  WREATHA JONG [id number] DOB: 1945-11-19 DOA: 05/17/2014 PCP: Redge Gainer, MD  Assessment/Plan: 1. Closed right hip fracture. Status post internal fixation by Dr. Aline Brochure. Will need further physical therapy. 2. Hypertension. Continue home regimen. Blood pressures in a reasonable range 3. Diabetes. On Amaryl and slight scale insulin. Continue to monitor. 4. Hypothyroidism. Continue Synthroid. 5. GERD. Continue Protonix  Code Status: full code Family Communication: discussed with patient Disposition Plan: pending physical therapy eval, will likely need SNF   Consultants:  orthopedics  Procedures:  Open treatment, internal fixation of right hip  Antibiotics:    HPI/Subjective: Patient seen in room post operatively. Reports pain is controlled. Has some nausea  Objective: Filed Vitals:   05/18/14 1933  BP:   Pulse:   Temp:   Resp: 16    Intake/Output Summary (Last 24 hours) at 05/18/14 1939 Last data filed at 05/18/14 1900  Gross per 24 hour  Intake   1240 ml  Output    905 ml  Net    335 ml   Filed Weights   05/17/14 1534 05/17/14 2006  Weight: 74.844 kg (165 lb) 87.181 kg (192 lb 3.2 oz)    Exam:   General:  NAD  Cardiovascular: S1, S2 RRR  Respiratory: CTAB  Abdomen: Soft, nt,nd, bs+  Musculoskeletal: no edema b/l   Data Reviewed: Basic Metabolic Panel:  Recent Labs Lab 05/17/14 1658 05/17/14 2125 05/18/14 0538  NA 138 137 139  K 5.1 5.2* 4.7  CL 106 106 108  CO2 24 24 25   GLUCOSE 299* 302* 234*  BUN 34* 35* 38*  CREATININE 1.54* 1.58* 1.61*  CALCIUM 9.1 9.0 8.7   Liver Function Tests:  Recent Labs Lab 05/17/14 1658 05/17/14 2125  AST 20 19  ALT 11 11  ALKPHOS 115 101  BILITOT 0.6 0.6  PROT 7.1 6.8  ALBUMIN 3.7 3.5   No results for input(s): LIPASE, AMYLASE in the last 168 hours. No results for input(s): AMMONIA in the last 168 hours. CBC:  Recent Labs Lab 05/17/14 1658  05/17/14 2125 05/18/14 0538  WBC 9.9 11.1* 9.2  NEUTROABS 7.9* 9.6*  --   HGB 12.1 11.4* 10.4*  HCT 38.1 35.2* 32.0*  MCV 93.6 92.9 93.0  PLT 163 172 183   Cardiac Enzymes: No results for input(s): CKTOTAL, CKMB, CKMBINDEX, TROPONINI in the last 168 hours. BNP (last 3 results) No results for input(s): PROBNP in the last 8760 hours. CBG:  Recent Labs Lab 05/17/14 2106 05/18/14 0734 05/18/14 1131 05/18/14 1445 05/18/14 1643  GLUCAP 285* 203* 202* 215* 258*    No results found for this or any previous visit (from the past 240 hour(s)).   Studies: Dg Chest 1 View  05/17/2014   CLINICAL DATA:  [place] [address] today trying to get out of chair, RIGHT hip pain/fracture  EXAM: CHEST - 1 VIEW  COMPARISON:  10/06/2012  FINDINGS: Normal heart size, mediastinal contours, and pulmonary vascularity.  Mild chronic elevation of RIGHT diaphragm.  Lungs clear.  No pleural effusion or pneumothorax.  Minimal atherosclerotic calcification aortic arch.  Bones demineralized with costovertebral degenerative changes at RIGHT ninth rib.  IMPRESSION: No acute abnormalities.   Electronically Signed   By: Lavonia Dana M.D.   On: 05/17/2014 16:52   Dg Hip Complete Right  05/17/2014   CLINICAL DATA:  Fall, acute right hip pain  EXAM: RIGHT HIP - COMPLETE 2+ VIEW  COMPARISON:  None.  FINDINGS: There is an acute comminuted right hip  intertrochanteric fracture. No associated subluxation or dislocation of the hip joint. Bones are osteopenic. Pelvis appears intact. Degenerative changes of both hips. Normal bowel gas pattern.  IMPRESSION: Acute comminuted right hip intertrochanteric fracture   Electronically Signed   By: Daryll Brod M.D.   On: 05/17/2014 16:51   Dg Hip Operative Right  05/18/2014   CLINICAL DATA:  Operative repair right hip fracture.  EXAM: DG OPERATIVE right HIP 1-2 VIEWS  TECHNIQUE: Fluoroscopic spot image(s) were submitted for interpretation post-operatively.  COMPARISON:  05/17/2014  FINDINGS:  Examination demonstrates internal fixation of patient's right intertrochanteric hip fracture with compression screw bridging the fracture into the femoral head with associated lateral fixation plate and screws over the proximal femoral diaphysis intact and normally located. Mild degenerative change of the right hip. Recommend correlation with findings at the time of the procedure.  IMPRESSION: Internal fixation of intertrochanteric right hip fracture with hardware normally located and intact.   Electronically Signed   By: Marin Olp M.D.   On: 05/18/2014 15:01    Scheduled Meds: . albuterol  2.5 mg Nebulization Q6H  . atorvastatin  40 mg Oral q1800  . calcitRIOL  0.25 mcg Oral Daily  . cloNIDine  0.2 mg Oral BID  . [START ON 05/21/2014] Darbepoetin Alfa  100 mcg Subcutaneous Q5 weeks  . docusate sodium  100 mg Oral BID  . [START ON 05/19/2014] enoxaparin (LOVENOX) injection  30 mg Subcutaneous Q24H  . furosemide  40 mg Oral Daily  . glimepiride  2 mg Oral QAC supper  . glimepiride  4 mg Oral Q breakfast  . insulin aspart  0-5 Units Subcutaneous QHS  . insulin aspart  0-9 Units Subcutaneous TID WC  . levothyroxine  75 mcg Oral QAC breakfast  . losartan  100 mg Oral Daily  . metoprolol succinate  50 mg Oral Daily  . pantoprazole  40 mg Oral BID  . polyethylene glycol  17 g Oral Daily  . senna  1 tablet Oral BID  . sodium polystyrene  15 g Oral Daily  . [START ON 05/19/2014] vancomycin  1,000 mg Intravenous Q12H   Continuous Infusions: . sodium chloride 100 mL/hr at 05/18/14 1536    Principal Problem:   Closed right hip fracture Active Problems:   Essential hypertension   Diabetes   Hip fracture requiring operative repair   Hypothyroidism   Intertrochanteric fracture of right femur    Time spent: 78mins    MEMON,JEHANZEB  [facility] Pager [phone]. If 7PM-7AM, please contact night-coverage at [email or web address], password [facility] 05/18/2014, 7:39 PM  LOS: 1 day

## 2014-05-18 NOTE — Progress Notes (Signed)
Patient is experiencing some nausea and vomiting. States that she doesn't feel like she can put anything on her stomach and refuses to take her PO medication.Patient also states that the Zofran is not working. Md was notified. No orders placed, will continue to monitor

## 2014-05-18 NOTE — Op Note (Signed)
Operative report for surgery on 05/18/2014 time 2:42 PM  History this is a 68 year old female who had a mechanical fall and fractured her right peritrochanteric region three-part fracture with greater trochanteric fracture fragment extending down into the shaft.  She is to the hospital had preop surgical clearance and was scheduled for surgery area  Preop diagnosis peritrochanteric fracture right hip/three-part postop diagnosis same  Procedure open treatment internal fixation right hip Implants [employer] 135 4 hole plate, 100 mm in length. CHS lag screw. 95 mm. Short compression screw. 4 screws. We also used a tension band #5 Tycron suture suture to secure the greater trochanteric fragment  Anesthetic Gen. There were no assistants Estimated blood loss 400 mL  No drains no complications  The patient was taken to the operating room and general anesthesia was placed on the fracture table. Peroneal post was placed. Left leg was placed in extension abducted. Right leg was placed in traction. Closed manipulation under C-arm guidance was performed. AP and lateral and oblique films confirm adequate reduction. Reduction was improved with pressure on the lateral side of the thigh which will be incorporated into the open reduction  ChloraPrep was used for preparation of the skin followed by sterile draping. Timeout was completed.  A lateral incision was made over the greater trochanter extended proximally and distally. Simultaneous tissue was divided. Fascia was split. vastus lateralis was split in line with the skin incision coagulating bleeders as they were encountered. Subperiosteal dissection expose the fracture. The greater trochanter fragment which extended down to the shaft was secured with a bone clamp. Guidepin was placed in the center the femoral head using a 24 mm tip apex distance as the goal and the pin was drilled across the acetabulum to secure it. This was done because of the  extension of the greater trochanteric fragment. Radiographs confirmed excellent position  Prior to extending it across the pelvis it was measured to 95 mm depth.  We then secured a #5 suture across the greater trochanter soft tissues and then in a figure-of-eight fashion held with a clamp.  The lag screw and plate was secured to the bone we used the third screw hole as the tension band anchor site and tied the #5 suture anchoring the greater trochanteric fragment.  The 4 screws were drilled in place using AO technique. Traction was released. Compression screw was placed.  Final films indicated good tip to apex distance reapproximation of the fracture fragments and good plate position.  Wound was irrigated and closed in layered fashion with 0 Monocryl for the vastus lateralis followed by 0 Monocryl for the fascia followed by 2-0 Monocryl for the skin and staples were used to secure the skin. We put 30 mL of Marcaine in the fascial layer and the vastus lateralis layer at the bone.  Sterile dressing was applied  Patient taken to the recovery room in stable condition

## 2014-05-19 LAB — BASIC METABOLIC PANEL
Anion gap: 10 (ref 5–15)
BUN: 35 mg/dL — AB (ref 6–23)
CHLORIDE: 108 meq/L (ref 96–112)
CO2: 20 mmol/L (ref 19–32)
Calcium: 8.4 mg/dL (ref 8.4–10.5)
Creatinine, Ser: 1.47 mg/dL — ABNORMAL HIGH (ref 0.50–1.10)
GFR calc Af Amer: 41 mL/min — ABNORMAL LOW (ref >90)
GFR, EST NON AFRICAN AMERICAN: 35 mL/min — AB (ref >90)
GLUCOSE: 374 mg/dL — AB (ref 70–99)
Potassium: 4.4 mmol/L (ref 3.5–5.1)
Sodium: 138 mmol/L (ref 135–145)

## 2014-05-19 LAB — CBC
HCT: 25.2 % — ABNORMAL LOW (ref 36.0–46.0)
HEMATOCRIT: 26.8 % — AB (ref 36.0–46.0)
HEMOGLOBIN: 8.9 g/dL — AB (ref 12.0–15.0)
HEMOGLOBIN: 8.4 g/dL — AB (ref 12.0–15.0)
MCH: 30.9 pg (ref 26.0–34.0)
MCH: 31 pg (ref 26.0–34.0)
MCHC: 33.2 g/dL (ref 30.0–36.0)
MCHC: 33.3 g/dL (ref 30.0–36.0)
MCV: 93.1 fL (ref 78.0–100.0)
MCV: 93 fL (ref 78.0–100.0)
Platelets: 159 10*3/uL (ref 150–400)
Platelets: 155 10*3/uL (ref 150–400)
RBC: 2.88 MIL/uL — AB (ref 3.87–5.11)
RBC: 2.71 MIL/uL — ABNORMAL LOW (ref 3.87–5.11)
RDW: 13.8 % (ref 11.5–15.5)
RDW: 14 % (ref 11.5–15.5)
WBC: 14.7 10*3/uL — ABNORMAL HIGH (ref 4.0–10.5)
WBC: 14.1 10*3/uL — AB (ref 4.0–10.5)

## 2014-05-19 LAB — GLUCOSE, CAPILLARY
GLUCOSE-CAPILLARY: 254 mg/dL — AB (ref 70–99)
Glucose-Capillary: 344 mg/dL — ABNORMAL HIGH (ref 70–99)
Glucose-Capillary: 320 mg/dL — ABNORMAL HIGH (ref 70–99)
Glucose-Capillary: 209 mg/dL — ABNORMAL HIGH (ref 70–99)

## 2014-05-19 MED ORDER — LORAZEPAM 2 MG/ML IJ SOLN
0.5000 mg | Freq: Once | INTRAMUSCULAR | Status: AC
  Administered 2014-05-20: 0.5 mg via INTRAVENOUS
  Filled 2014-05-19: qty 1

## 2014-05-19 MED ORDER — INSULIN DETEMIR 100 UNIT/ML ~~LOC~~ SOLN
10.0000 [IU] | Freq: Every day | SUBCUTANEOUS | Status: DC
  Administered 2014-05-19 – 2014-05-22 (×4): 10 [IU] via SUBCUTANEOUS
  Filled 2014-05-19 (×8): qty 0.1

## 2014-05-19 MED ORDER — ALBUTEROL SULFATE (2.5 MG/3ML) 0.083% IN NEBU
2.5000 mg | INHALATION_SOLUTION | Freq: Three times a day (TID) | RESPIRATORY_TRACT | Status: DC
  Administered 2014-05-19 – 2014-05-20 (×3): 2.5 mg via RESPIRATORY_TRACT
  Filled 2014-05-19 (×6): qty 3

## 2014-05-19 MED ORDER — HYDRALAZINE HCL 20 MG/ML IJ SOLN
5.0000 mg | Freq: Once | INTRAMUSCULAR | Status: AC
  Administered 2014-05-20: 5 mg via INTRAVENOUS
  Filled 2014-05-19: qty 1

## 2014-05-19 MED ORDER — PANTOPRAZOLE SODIUM 40 MG IV SOLR
40.0000 mg | Freq: Once | INTRAVENOUS | Status: AC
  Administered 2014-05-20: 40 mg via INTRAVENOUS
  Filled 2014-05-19: qty 40

## 2014-05-19 MED ORDER — SODIUM CHLORIDE 0.9 % IV BOLUS (SEPSIS)
500.0000 mL | INTRAVENOUS | Status: AC
  Administered 2014-05-19 (×2): 500 mL via INTRAVENOUS

## 2014-05-19 MED ORDER — ONDANSETRON HCL 4 MG/2ML IJ SOLN
4.0000 mg | Freq: Four times a day (QID) | INTRAMUSCULAR | Status: DC
  Administered 2014-05-19 – 2014-05-20 (×6): 4 mg via INTRAVENOUS
  Filled 2014-05-19 (×6): qty 2

## 2014-05-19 MED ORDER — LEVOTHYROXINE SODIUM 100 MCG IV SOLR
25.0000 ug | Freq: Every day | INTRAVENOUS | Status: DC
  Administered 2014-05-20: 25 ug via INTRAVENOUS
  Filled 2014-05-19 (×5): qty 5

## 2014-05-19 MED ORDER — METOPROLOL TARTRATE 1 MG/ML IV SOLN
5.0000 mg | Freq: Four times a day (QID) | INTRAVENOUS | Status: DC
  Administered 2014-05-19 – 2014-05-20 (×3): 5 mg via INTRAVENOUS
  Filled 2014-05-19 (×4): qty 5

## 2014-05-19 MED ORDER — HYDRALAZINE HCL 20 MG/ML IJ SOLN
5.0000 mg | Freq: Once | INTRAMUSCULAR | Status: AC
  Administered 2014-05-19: 5 mg via INTRAVENOUS
  Filled 2014-05-19: qty 1

## 2014-05-19 MED ORDER — VANCOMYCIN HCL IN DEXTROSE 1-5 GM/200ML-% IV SOLN
INTRAVENOUS | Status: AC
  Filled 2014-05-19: qty 200

## 2014-05-19 NOTE — Progress Notes (Signed)
Pt experiencing some N/V and refuses to take PO medications. Pt has PO catapres and protonix ordered. Pt BP 192/83 with me a pulse of 87.  I have notified the MD and will continue to monitor

## 2014-05-19 NOTE — Progress Notes (Signed)
Two attempts were made this morning to begin PT following pt's OTIF of the right hip.  She has been extremely nauseated and has been medicated numerous times for it, to little avail.  We have decided to hold initial PT visit until tomorrow.  Having talked with the pt, it is clear that she will need SNF at d/c.

## 2014-05-19 NOTE — Progress Notes (Signed)
Patient ID: [name], female   DOB: [birth year], 68 y.o.   MRN: [id number]    POD 1 S/P OTIF RIGHT HIP WITH DHS   BP 153/69 mmHg  Pulse 102  Temp(Src) 98.6 F (37 C) (Oral)  Resp 17  Ht 5\' 2"  (1.575 m)  Wt 192 lb 3.2 oz (87.181 kg)  BMI 35.14 kg/m2  SpO2 98%  Hemoglobin & Hematocrit     Component Value Date/Time   HGB 8.9* 05/19/2014 0623   HCT 26.8* 05/19/2014 0623   DRESSING clean and dry   NEURO-VASCULO-MOTOR  STATUS OF LIMB: normal   A/P  1. Try PT 2. Address nausea with reglan follow creatinine and urine out put  3. Give 1 liter of fluid to address tachycardia if no improvement I will give 1 unit of PRBC's 4. Last BM thurs, if no BM today then enema tomorrow

## 2014-05-19 NOTE — Progress Notes (Signed)
Pt continues to complain of nausea and is vomiting small amounts of clear/brown tinged spit.  PRN medicines have decreased nausea, but not completely relieved.  Pt is willing to try aromatherapy.  Pt smelled drop of peppermint oil.  If effective, will add to nursing orders.  Will continue to monitor.

## 2014-05-19 NOTE — Progress Notes (Signed)
[facility] PROGRESS NOTE  [name] [id number] DOB: 1946/02/09 DOA: 05/17/2014 PCP: Redge Gainer, MD  68 y/o h/o L hip # 10/12/12 s/p repair, DM ty 2, Htn, Hld, CKD II-III, Prior UGIB from [facility] [? 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  She became nauseous day of surgery and after surgery and was not able to keep down anything orally. She was found to have some blood tinged emesis and kept nothing by mouth except for sips with ice chips-Stat hemoglobin pending at time of this note  Assessment/Plan: 1. Nausea/vomiting-stat CBC pending. Nothing by mouth. May have ice chips. Convert all meds oral.  If hemoglobin drops more if she has further tinged vomit, and consult GI. I have held her Lasix and she will continue IV saline 100 cc per hour for now Closed right hip fracture. Status post internal fixation by Dr. Aline Brochure. Will need further physical therapy. 2. Hypertension. Medications changed to IV as per MAR 3. Diabetes. Held Amaryl given vomiting slight scale insulin. Continue to monitor-blood sugars have been 290-300 today so we will start Levemir 10 units 4. Hypothyroidism. Continue Synthroid as IV 25 g for now as nothing by mouth 5. GERD. Continue Protonix has IV twice a day  Code Status: full code Family Communication: discussed with patient Disposition Plan: pending physical therapy eval, will likely need SNF   Consultants:  orthopedics  Procedures:  Open treatment, internal fixation of right hip  Antibiotics:    HPI/Subjective:  Increasing nausea, mild abdominal discomfort no overt pain but appears comfortable but cannot tolerate sips as yet. Has had GI bleeds in the past and takes Reglan on a when necessary basis Overall she feels comfortable  Objective: Filed Vitals:   05/19/14 1405  BP:  166/65  Pulse: 97  Temp: 99.4 F (37.4 C)  Resp: 18    Intake/Output Summary (Last 24 hours) at 05/19/14 1528 Last data filed at 05/19/14 1300  Gross per 24 hour  Intake 2703.33 ml  Output   1200 ml  Net 1503.33 ml   Filed Weights   05/17/14 1534 05/17/14 2006  Weight: 74.844 kg (165 lb) 87.181 kg (192 lb 3.2 oz)    Exam:   General:  NAD  Cardiovascular: S1, S2 RRR  Respiratory: CTAB, clinically clear  Abdomen: Soft, nt,nd, bs+ no abdominal discomfort  Musculoskeletal: no edema b/l   Data Reviewed: Basic Metabolic Panel:  Recent Labs Lab 05/17/14 1658 05/17/14 2125 05/18/14 0538 05/19/14 0623  NA 138 137 139 138  K 5.1 5.2* 4.7 4.4  CL 106 106 108 108  CO2 24 24 25 20   GLUCOSE 299* 302* 234* 374*  BUN 34* 35* 38* 35*  CREATININE 1.54* 1.58* 1.61* 1.47*  CALCIUM 9.1 9.0 8.7 8.4   Liver Function Tests:  Recent Labs Lab 05/17/14 1658 05/17/14 2125  AST 20 19  ALT 11 11  ALKPHOS 115 101  BILITOT 0.6 0.6  PROT 7.1 6.8  ALBUMIN 3.7 3.5   No results for input(s): LIPASE, AMYLASE in the last 168 hours. No results for input(s): AMMONIA in the last 168 hours. CBC:  Recent Labs Lab 05/17/14 1658 05/17/14 2125 05/18/14 0538 05/19/14 0623  WBC 9.9 11.1* 9.2 14.7*  NEUTROABS 7.9* 9.6*  --   --   HGB 12.1 11.4* 10.4* 8.9*  HCT 38.1 35.2* 32.0* 26.8*  MCV 93.6 92.9 93.0  93.1  PLT 163 172 183 159   Cardiac Enzymes: No results for input(s): CKTOTAL, CKMB, CKMBINDEX, TROPONINI in the last 168 hours. BNP (last 3 results) No results for input(s): PROBNP in the last 8760 hours. CBG:  Recent Labs Lab 05/18/14 1445 05/18/14 1643 05/18/14 2049 05/19/14 0740 05/19/14 1151  GLUCAP 215* 258* 296* 344* 320*    No results found for this or any previous visit (from the past 240 hour(s)).   Studies: Dg Chest 1 View  05/17/2014   CLINICAL DATA:  [place] [address] today trying to get out of chair, RIGHT hip pain/fracture  EXAM: CHEST - 1 VIEW  COMPARISON:   10/06/2012  FINDINGS: Normal heart size, mediastinal contours, and pulmonary vascularity.  Mild chronic elevation of RIGHT diaphragm.  Lungs clear.  No pleural effusion or pneumothorax.  Minimal atherosclerotic calcification aortic arch.  Bones demineralized with costovertebral degenerative changes at RIGHT ninth rib.  IMPRESSION: No acute abnormalities.   Electronically Signed   By: Lavonia Dana M.D.   On: 05/17/2014 16:52   Dg Hip Complete Right  05/17/2014   CLINICAL DATA:  Fall, acute right hip pain  EXAM: RIGHT HIP - COMPLETE 2+ VIEW  COMPARISON:  None.  FINDINGS: There is an acute comminuted right hip intertrochanteric fracture. No associated subluxation or dislocation of the hip joint. Bones are osteopenic. Pelvis appears intact. Degenerative changes of both hips. Normal bowel gas pattern.  IMPRESSION: Acute comminuted right hip intertrochanteric fracture   Electronically Signed   By: Daryll Brod M.D.   On: 05/17/2014 16:51   Dg Hip Operative Right  05/18/2014   CLINICAL DATA:  Operative repair right hip fracture.  EXAM: DG OPERATIVE right HIP 1-2 VIEWS  TECHNIQUE: Fluoroscopic spot image(s) were submitted for interpretation post-operatively.  COMPARISON:  05/17/2014  FINDINGS: Examination demonstrates internal fixation of patient's right intertrochanteric hip fracture with compression screw bridging the fracture into the femoral head with associated lateral fixation plate and screws over the proximal femoral diaphysis intact and normally located. Mild degenerative change of the right hip. Recommend correlation with findings at the time of the procedure.  IMPRESSION: Internal fixation of intertrochanteric right hip fracture with hardware normally located and intact.   Electronically Signed   By: Marin Olp M.D.   On: 05/18/2014 15:01    Scheduled Meds: . albuterol  2.5 mg Nebulization TID  . atorvastatin  40 mg Oral q1800  . calcitRIOL  0.25 mcg Oral Daily  . cloNIDine  0.2 mg Oral BID  .  [START ON 05/21/2014] Darbepoetin Alfa  100 mcg Subcutaneous Q5 weeks  . docusate sodium  100 mg Oral BID  . enoxaparin (LOVENOX) injection  30 mg Subcutaneous Q24H  . furosemide  40 mg Oral Daily  . glimepiride  2 mg Oral QAC supper  . glimepiride  4 mg Oral Q breakfast  . insulin aspart  0-5 Units Subcutaneous QHS  . insulin aspart  0-9 Units Subcutaneous TID WC  . levothyroxine  75 mcg Oral QAC breakfast  . losartan  100 mg Oral Daily  . metoprolol succinate  50 mg Oral Daily  . ondansetron (ZOFRAN) IV  4 mg Intravenous 4 times per day  . pantoprazole  40 mg Oral BID  . polyethylene glycol  17 g Oral Daily  . senna  1 tablet Oral BID  . sodium polystyrene  15 g Oral Daily   Continuous Infusions: . sodium chloride 100 mL/hr at 05/19/14 1446    Principal Problem:   Closed right  hip fracture Active Problems:   Essential hypertension   Diabetes   Hip fracture requiring operative repair   Hypothyroidism   Intertrochanteric fracture of right femur   Fall    Time spent: 59mins    Estle Sabella, [place]  [facility] Pager [phone]. If 7PM-7AM, please contact night-coverage at [email or web address], password [facility] 05/19/2014, 3:28 PM  LOS: 2 days

## 2014-05-19 NOTE — Progress Notes (Signed)
Pt BP 199/78. Pt still refusing PO medication. Dr.Newton  notified and a telephone order was given for 5 mg of Hydralazine IV and if ineffective repeat. George Hugh D, RN

## 2014-05-20 DIAGNOSIS — W19XXXD Unspecified fall, subsequent encounter: Secondary | ICD-10-CM

## 2014-05-20 LAB — GLUCOSE, CAPILLARY
GLUCOSE-CAPILLARY: 210 mg/dL — AB (ref 70–99)
Glucose-Capillary: 166 mg/dL — ABNORMAL HIGH (ref 70–99)
Glucose-Capillary: 157 mg/dL — ABNORMAL HIGH (ref 70–99)
Glucose-Capillary: 146 mg/dL — ABNORMAL HIGH (ref 70–99)

## 2014-05-20 LAB — COMPREHENSIVE METABOLIC PANEL
ALBUMIN: 2.9 g/dL — AB (ref 3.5–5.2)
ALK PHOS: 65 U/L (ref 39–117)
ALT: 12 U/L (ref 0–35)
ANION GAP: 7 (ref 5–15)
AST: 36 U/L (ref 0–37)
BILIRUBIN TOTAL: 0.6 mg/dL (ref 0.3–1.2)
BUN: 27 mg/dL — ABNORMAL HIGH (ref 6–23)
CHLORIDE: 112 meq/L (ref 96–112)
CO2: 19 mmol/L (ref 19–32)
Calcium: 8 mg/dL — ABNORMAL LOW (ref 8.4–10.5)
Creatinine, Ser: 1.11 mg/dL — ABNORMAL HIGH (ref 0.50–1.10)
GFR calc Af Amer: 58 mL/min — ABNORMAL LOW (ref >90)
GFR calc non Af Amer: 50 mL/min — ABNORMAL LOW (ref >90)
Glucose, Bld: 167 mg/dL — ABNORMAL HIGH (ref 70–99)
POTASSIUM: 3.8 mmol/L (ref 3.5–5.1)
Sodium: 138 mmol/L (ref 135–145)
Total Protein: 5.7 g/dL — ABNORMAL LOW (ref 6.0–8.3)

## 2014-05-20 LAB — CBC WITH DIFFERENTIAL/PLATELET
BASOS PCT: 0 % (ref 0–1)
Basophils Absolute: 0 10*3/uL (ref 0.0–0.1)
Eosinophils Absolute: 0.1 10*3/uL (ref 0.0–0.7)
Eosinophils Relative: 1 % (ref 0–5)
HCT: 23 % — ABNORMAL LOW (ref 36.0–46.0)
HEMOGLOBIN: 7.6 g/dL — AB (ref 12.0–15.0)
LYMPHS ABS: 2.2 10*3/uL (ref 0.7–4.0)
Lymphocytes Relative: 15 % (ref 12–46)
MCH: 30.3 pg (ref 26.0–34.0)
MCHC: 33 g/dL (ref 30.0–36.0)
MCV: 91.6 fL (ref 78.0–100.0)
Monocytes Absolute: 1.2 10*3/uL — ABNORMAL HIGH (ref 0.1–1.0)
Monocytes Relative: 8 % (ref 3–12)
NEUTROS ABS: 11 10*3/uL — AB (ref 1.7–7.7)
NEUTROS PCT: 76 % (ref 43–77)
Platelets: 154 10*3/uL (ref 150–400)
RBC: 2.51 MIL/uL — AB (ref 3.87–5.11)
RDW: 13.9 % (ref 11.5–15.5)
WBC: 14.4 10*3/uL — ABNORMAL HIGH (ref 4.0–10.5)

## 2014-05-20 LAB — PREPARE RBC (CROSSMATCH)

## 2014-05-20 MED ORDER — DIPHENHYDRAMINE HCL 25 MG PO CAPS
25.0000 mg | ORAL_CAPSULE | Freq: Once | ORAL | Status: AC
  Administered 2014-05-20: 25 mg via ORAL
  Filled 2014-05-20: qty 1

## 2014-05-20 MED ORDER — SODIUM CHLORIDE 0.9 % IV SOLN: Freq: Once | INTRAVENOUS | Status: AC

## 2014-05-20 MED ORDER — DARBEPOETIN ALFA 100 MCG/0.5ML IJ SOSY: 100.0000 ug | PREFILLED_SYRINGE | INTRAMUSCULAR | Status: DC

## 2014-05-20 MED ORDER — DARBEPOETIN ALFA 100 MCG/0.5ML IJ SOSY
100.0000 ug | PREFILLED_SYRINGE | Freq: Once | INTRAMUSCULAR | Status: AC
  Administered 2014-05-20: 100 ug via SUBCUTANEOUS
  Filled 2014-05-20: qty 0.5

## 2014-05-20 MED ORDER — METOPROLOL SUCCINATE ER 25 MG PO TB24
25.0000 mg | ORAL_TABLET | Freq: Every day | ORAL | Status: DC
  Administered 2014-05-20 – 2014-05-21 (×2): 25 mg via ORAL
  Filled 2014-05-20 (×2): qty 1

## 2014-05-20 MED ORDER — FUROSEMIDE 10 MG/ML IJ SOLN
20.0000 mg | Freq: Once | INTRAMUSCULAR | Status: AC
  Administered 2014-05-20: 20 mg via INTRAVENOUS
  Filled 2014-05-20: qty 2

## 2014-05-20 MED ORDER — ALBUTEROL SULFATE (2.5 MG/3ML) 0.083% IN NEBU: 2.5000 mg | INHALATION_SOLUTION | Freq: Four times a day (QID) | RESPIRATORY_TRACT | Status: DC | PRN

## 2014-05-20 MED ORDER — ENOXAPARIN SODIUM 40 MG/0.4ML ~~LOC~~ SOLN
40.0000 mg | SUBCUTANEOUS | Status: DC
  Administered 2014-05-21 – 2014-05-23 (×3): 40 mg via SUBCUTANEOUS
  Filled 2014-05-20 (×3): qty 0.4

## 2014-05-20 MED ORDER — FUROSEMIDE 10 MG/ML IJ SOLN
20.0000 mg | Freq: Two times a day (BID) | INTRAMUSCULAR | Status: AC
  Administered 2014-05-20 (×2): 20 mg via INTRAVENOUS
  Filled 2014-05-20 (×2): qty 2

## 2014-05-20 MED ORDER — POLYETHYLENE GLYCOL 3350 17 G PO PACK
17.0000 g | PACK | Freq: Every day | ORAL | Status: DC
  Administered 2014-05-20 – 2014-05-23 (×4): 17 g via ORAL
  Filled 2014-05-20 (×4): qty 1

## 2014-05-20 MED ORDER — LOSARTAN POTASSIUM 50 MG PO TABS
25.0000 mg | ORAL_TABLET | Freq: Every day | ORAL | Status: DC
  Administered 2014-05-20 – 2014-05-23 (×4): 25 mg via ORAL
  Filled 2014-05-20 (×4): qty 1

## 2014-05-20 MED ORDER — ACETAMINOPHEN 325 MG PO TABS
650.0000 mg | ORAL_TABLET | Freq: Once | ORAL | Status: AC
  Administered 2014-05-21: 650 mg via ORAL
  Filled 2014-05-20: qty 2

## 2014-05-20 NOTE — Clinical Documentation Improvement (Signed)
Supporting Information: Patient with a history of anemia secondary to CKD per 12/25 H&P.  Labs: H/H: 12/25: 11.4/35.2. 12/28:   7.6/23.0.  Treatment: 12/28:  Transfuse 2 units PRBC.   Possible Clinical Condition: --Acute Blood Loss Anemia --Acute on Chronic Blood Loss Anemia --Chronic Blood Loss Anemia --Acute Blood Loss Anemia on Anemia of CKD --Other (please specify) . Link any laboratory findings to a related diagnosis (if appropriate) . Document any associated diagnoses/conditions   Thank Sherian Maroon Documentation Specialist [phone] [email or web address]

## 2014-05-20 NOTE — Addendum Note (Signed)
Addendum  created 05/20/14 1026 by Vista Deck, CRNA   Modules edited: Notes Section   Notes Section:  File: [id number]

## 2014-05-20 NOTE — Evaluation (Signed)
Physical Therapy Evaluation Patient Details Name: [name] MRN: [id number] DOB: 07/26/1945 Today's Date: 05/20/2014   History of Present Illness  With a history of hypertension, and type 2 diabetes with diabetic retinopathy and stage III chronic kidney disease, anemia secondary to chronic kidney disease, hyperlipidemia, depression, hypothyroidism.  She presents to the hospital by EMS due to sudden onset of right hip pain following a fall. The patient was sitting down and her leg gave out when she went to stand up. She fell striking her right hip on the ground. She was unable to place any weight on it although she was not in a lot of pain. She was brought to the emergency department and was found to have a right closed, comminuted hip fracture.  Clinical Impression   Pt is a very pleasant female with OTIF of the RLE done on 05-18-14.  She is no longer nauseated and reports feeling well with no pain this morning.  She has had no pain medicine earlier by her report.  She is normally totally independent at home with no assistive device.  She currently has expected weakness of the RLE due to surgery as well as limited flexion of the right hip and knee.  She has generalized weakness in UEs and LLE probably secondary to multiple medical issues.  She is highly motivated and was able to transfer to EOB with mod assist.  She stood to the walker and was able to pivot to a chair but unable to ambulate due to pain with weight bearing and weakness.  She will need SNF at d/c and she is in agreement with this.  We will see her twice daily while in house.    Follow Up Recommendations SNF    Equipment Recommendations  None recommended by PT    Recommendations for Other Services   OT    Precautions / Restrictions Precautions Precautions: Fall Restrictions RLE Weight Bearing: Weight bearing as tolerated      Mobility  Bed Mobility Overal bed mobility: Needs Assistance Bed Mobility: Supine to Sit      Supine to sit: Mod assist;HOB elevated        Transfers Overall transfer level: Needs assistance Equipment used: Rolling walker (2 wheeled) Transfers: Sit to/from [facility] Sit to Stand: Mod assist Stand pivot transfers: Mod assist       General transfer comment: pt able to use walker to transfer bed to chair with WBAT right...she is able to move RLE actively to step but has difficutly off loading weight from the LLE and scoots that foot on the floor to pivot  Ambulation/Gait Ambulation/Gait assistance:  (unable to ambulate due to weakness in LLE, pain in weight bearing RLE)              Stairs            Wheelchair Mobility    Modified Rankin (Stroke Patients Only)       Balance Overall balance assessment: No apparent balance deficits (not formally assessed)                                           Pertinent Vitals/Pain Pain Assessment: No/denies pain    Home Living Family/patient expects to be discharged to:: Skilled nursing facility  Prior Function Level of Independence: Independent               Hand Dominance   Dominant Hand: Right    Extremity/Trunk Assessment   Upper Extremity Assessment: Generalized weakness           Lower Extremity Assessment: Generalized weakness;RLE deficits/detail RLE Deficits / Details: Pt with OTIF of the right hip with incision into the femur...pt has good ankle strength and 2+/5 quadriceps strength...hip and knee flexion is significantly impaired with knee flexion to 30 degrees, hip to 50 degrees    Cervical / Trunk Assessment: Kyphotic  Communication   Communication: No difficulties  Cognition Arousal/Alertness: Awake/alert Behavior During Therapy: WFL for tasks assessed/performed Overall Cognitive Status: Within Functional Limits for tasks assessed                      General Comments      Exercises General  Exercises - Lower Extremity Ankle Circles/Pumps: AROM;Both;10 reps;Supine Quad Sets: AROM;Both;10 reps;Supine Gluteal Sets: AROM;Both;10 reps;Supine Short Arc Quad: AAROM;Both;10 reps;Supine Heel Slides: AAROM;Both;10 reps;Supine Hip ABduction/ADduction: AAROM;Both;10 reps;Supine      Assessment/Plan    PT Assessment Patient needs continued PT services  PT Diagnosis Difficulty walking;Generalized weakness;Acute pain   PT Problem List Decreased strength;Decreased range of motion;Decreased activity tolerance;Decreased mobility;Pain;Obesity  PT Treatment Interventions DME instruction;Gait training;Functional mobility training;Therapeutic exercise;Patient/family education   PT Goals (Current goals can be found in the Care Plan section) Acute Rehab PT Goals Patient Stated Goal: none stated PT Goal Formulation: With patient Time For Goal Achievement: 06/03/14 Potential to Achieve Goals: Good    Frequency Min 6X/week   Barriers to discharge   none    Co-evaluation               End of Session Equipment Utilized During Treatment: Gait belt Activity Tolerance: Patient tolerated treatment well Patient left: in chair;with call bell/phone within reach (chair alarm not available) Nurse Communication: Mobility status         Time: [id number] PT Time Calculation (min) (ACUTE ONLY): 34 min   Charges:   PT Evaluation $Initial PT Evaluation Tier I: 1 Procedure PT Treatments $Therapeutic Exercise: 8-22 mins   PT G Codes:        Tiarah Shisler L 05/20/2014, 11:00 AM

## 2014-05-20 NOTE — Progress Notes (Signed)
Subjective: Doing well nausea improved  Objective: Vital signs in last 24 hours: Temp:  [98.4 F (36.9 C)-99.4 F (37.4 C)] 98.8 F (37.1 C) (12/28 0642) Pulse Rate:  [87-97] 93 (12/28 0642) Resp:  [16-18] 18 (12/28 0642) BP: (150-192)/(65-83) 150/65 mmHg (12/28 0642) SpO2:  [96 %-99 %] 98 % (12/28 0717)  Intake/Output from previous day: 12/27 0701 - 12/28 0700 In: 2363.3 [I.V.:1471.7; IV Piggyback:891.7] Out: 820 [Urine:820] Intake/Output this shift:     Recent Labs  05/17/14 1658 05/17/14 2125 05/18/14 0538 05/19/14 0623 05/19/14 1641  HGB 12.1 11.4* 10.4* 8.9* 8.4*    Recent Labs  05/19/14 0623 05/19/14 1641  WBC 14.7* 14.1*  RBC 2.88* 2.71*  HCT 26.8* 25.2*  PLT 159 155    Recent Labs  05/18/14 0538 05/19/14 0623  NA 139 138  K 4.7 4.4  CL 108 108  CO2 25 20  BUN 38* 35*  CREATININE 1.61* 1.47*  GLUCOSE 234* 374*  CALCIUM 8.7 8.4    Recent Labs  05/17/14 2125  INR 0.99    Neurologically intact Neurovascular intact Sensation intact distally Intact pulses distally Dorsiflexion/Plantar flexion intact Incision: dressing C/D/I  Assessment/Plan: PT  transfuse   Arther Abbott 05/20/2014, 7:27 AM

## 2014-05-20 NOTE — Clinical Social Work Placement (Signed)
Clinical Social Work Department CLINICAL SOCIAL WORK PLACEMENT NOTE 05/20/2014  Patient:  [name], [name]  Account Number:  [id number] Admit date:  05/17/2014  Clinical Social Worker:  Daiva Huge  Date/time:  05/20/2014 02:06 PM  Clinical Social Work is seeking post-discharge placement for this patient at the following level of care:   SKILLED NURSING   (*CSW will update this form in Epic as items are completed)   05/20/2014  Patient/family provided with [facility] Department of Clinical Social Work's list of facilities offering this level of care within the geographic area requested by the patient (or if unable, by the patient's family).  05/20/2014  Patient/family informed of their freedom to choose among providers that offer the needed level of care, that participate in Medicare, Medicaid or managed care program needed by the patient, have an available bed and are willing to accept the patient.  05/20/2014  Patient/family informed of [facility]' ownership interest in [facility], as well as of the fact that they are under no obligation to receive care at this facility.  PASARR submitted to EDS on 05/20/2014 PASARR number received on 05/20/2014  FL2 transmitted to all facilities in geographic area requested by pt/family on  05/20/2014 FL2 transmitted to all facilities within larger geographic area on   Patient informed that his/her managed care company has contracts with or will negotiate with  certain facilities, including the following:     Patient/family informed of bed offers received:   Patient chooses bed at  Physician recommends and patient chooses bed at    Patient to be transferred to  on   Patient to be transferred to facility by  Patient and family notified of transfer on  Name of family member notified:    The following physician request were entered in Epic:   Additional Comments: Eduard Clos, MSW, [place]

## 2014-05-20 NOTE — Progress Notes (Signed)
Physical Therapy Treatment Patient Details Name: [name] MRN: [id number] DOB: 03-29-1946 Today's Date: 05/20/2014    History of Present Illness With a history of hypertension, and type 2 diabetes with diabetic retinopathy and stage III chronic kidney disease, anemia secondary to chronic kidney disease, hyperlipidemia, depression, hypothyroidism.  She presents to the hospital by EMS due to sudden onset of right hip pain following a fall. The patient was sitting down and her leg gave out when she went to stand up. She fell striking her right hip on the ground. She was unable to place any weight on it although she was not in a lot of pain. She was brought to the emergency department and was found to have a right closed, comminuted hip fracture.    PT Comments    Pt sat in a recliner for about 3 hours, tolerating very well.  She continues to have no pain at rest.  She was instructed in proper hand position for sit to stand transfer as well as in anterior weight shift.  She required mod assist to achieve stance.  She was instructed in pivot transfer using a walker and transfer continues to be quite labored.  She is unable to take any steps as yet.  Ice was applied to her right hip at end of visit.  She was too fatigued to participate in any therapeutic exercise this afternoon.  Follow Up Recommendations  SNF     Equipment Recommendations  None recommended by PT    Recommendations for Other Services  OT     Precautions / Restrictions Precautions Precautions: Fall Restrictions Weight Bearing Restrictions: Yes RLE Weight Bearing: Weight bearing as tolerated    Mobility  Bed Mobility Overal bed mobility: Needs Assistance Bed Mobility: Sit to Supine     Supine to sit: Mod assist;HOB elevated Sit to supine: Max assist   General bed mobility comments: assist needed to support trunk and both LEs into the bed  Transfers Overall transfer level: Needs assistance Equipment used:  Rolling walker (2 wheeled) Transfers: Sit to/from [facility] Sit to Stand: Mod assist Stand pivot transfers: Mod assist       General transfer comment: pt able to use walker to transfer bed to chair with WBAT right...she is able to move RLE actively to step but has difficutly off loading weight from the LLE and scoots that foot on the floor to pivot  Ambulation/Gait Ambulation/Gait assistance:  (unable to ambulate)               Stairs            Wheelchair Mobility    Modified Rankin (Stroke Patients Only)       Balance Overall balance assessment: No apparent balance deficits (not formally assessed)                                  Cognition Arousal/Alertness: Awake/alert Behavior During Therapy: WFL for tasks assessed/performed Overall Cognitive Status: Within Functional Limits for tasks assessed                      Exercises General Exercises - Lower Extremity Ankle Circles/Pumps: AROM;Both;10 reps;Supine Quad Sets: AROM;Both;10 reps;Supine Gluteal Sets: AROM;Both;10 reps;Supine Short Arc Quad: AAROM;Both;10 reps;Supine Heel Slides: AAROM;Both;10 reps;Supine Hip ABduction/ADduction: AAROM;Both;10 reps;Supine    General Comments        Pertinent Vitals/Pain Pain Assessment: No/denies pain  Home Living Family/patient expects to be discharged to:: Skilled nursing facility                    Prior Function Level of Independence: Independent          PT Goals (current goals can now be found in the care plan section) Acute Rehab PT Goals Patient Stated Goal: none stated PT Goal Formulation: With patient Time For Goal Achievement: 06/03/14 Potential to Achieve Goals: Good Progress towards PT goals: Progressing toward goals    Frequency  Min 6X/week    PT Plan Current plan remains appropriate    Co-evaluation             End of Session Equipment Utilized During Treatment: Gait  belt Activity Tolerance: Patient limited by fatigue Patient left: in bed;with call bell/phone within reach;with bed alarm set     Time: 1250-1313 PT Time Calculation (min) (ACUTE ONLY): 23 min  Charges:  $Therapeutic Exercise: 8-22 mins $Therapeutic Activity: 8-22 mins                    G Codes:      Sable Feil 2014-05-30, 1:22 PM

## 2014-05-20 NOTE — Progress Notes (Signed)
[facility] PROGRESS NOTE  [name] [id number] DOB: 05/06/1946 DOA: 05/17/2014 PCP: Redge Gainer, MD  68 y/o h/o L hip # 10/12/12 s/p repair, DM ty 2, Htn, Hld, CKD II-III, Prior UGIB from [facility] [? 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  She became nauseous day of surgery and after surgery and was not able to keep down anything orally. She was found to have some blood tinged emesis and kept nothing by mouth except for sips with ice chips-Stat hemoglobin pending at time of this note  Assessment/Plan: 1. Nausea/vomiting-potential Mallory-Weiss tear-her hematocrit dropped to 7.612/28 she was transfused 2 units packed red blood cells. Await repeat CBC if no further bleeding may be able to be discharged in a.m.. If hemoglobin still in the 7 range in a.m., consult GI for workup 2. Acute blood loss anemia multifactorial-status post hip surgery/status secondary to #1-transfused 2 units. Repeat CBC in a.m. 3. Right Hip fracture status post repair 12/26-will need skilled nursing care as per physical occupational therapy 4. Hypertension-resume by mouth medications at lower than home dosage of 25 mg Toprol-XL, losartan 25 mg 5. Diabetes. Held Amaryl given vomiting, continue sliding scale insulin. Continue to monitor-blood sugars have been 2 10-157 today today so we will start Levemir 10 units 6. Hypothyroidism. Continue Synthroid as IV 25 g for now as nothing by mouth 7. GERD. Continue Protonix has IV twice a day  Code Status: full code Family Communication: discussed with patient Disposition Plan: pending physical therapy eval, will likely need SNF   Consultants:  orthopedics  Procedures:  Open treatment, internal fixation of right hip  Antibiotics:    HPI/Subjective:  Doing fair tolerating sips and clears  thinks that she is better Pain is managed No nausea no vomiting No diarrhea but hasn't had stool and requesting laxative  Objective: Filed Vitals:   05/20/14 1649  BP: 145/49  Pulse: 97  Temp: 98.4 F (36.9 C)  Resp:     Intake/Output Summary (Last 24 hours) at 05/20/14 1751 Last data filed at 05/20/14 1254  Gross per 24 hour  Intake   1850 ml  Output      0 ml  Net   1850 ml   Filed Weights   05/17/14 1534 05/17/14 2006  Weight: 74.844 kg (165 lb) 87.181 kg (192 lb 3.2 oz)    Exam:   General:  NAD  Cardiovascular: S1, S2 RRR  Respiratory: CTAB, clinically clear  Abdomen: Soft, nt,nd, bs+ no abdominal discomfort  Musculoskeletal: no edema b/l   Data Reviewed: Basic Metabolic Panel:  Recent Labs Lab 05/17/14 1658 05/17/14 2125 05/18/14 0538 05/19/14 0623 05/20/14 0750  NA 138 137 139 138 138  K 5.1 5.2* 4.7 4.4 3.8  CL 106 106 108 108 112  CO2 24 24 25 20 19   GLUCOSE 299* 302* 234* 374* 167*  BUN 34* 35* 38* 35* 27*  CREATININE 1.54* 1.58* 1.61* 1.47* 1.11*  CALCIUM 9.1 9.0 8.7 8.4 8.0*   Liver Function Tests:  Recent Labs Lab 05/17/14 1658 05/17/14 2125 05/20/14 0750  AST 20 19 36  ALT 11 11 12   ALKPHOS 115 101 65  BILITOT 0.6 0.6 0.6  PROT 7.1 6.8 5.7*  ALBUMIN 3.7 3.5 2.9*   No results for input(s): LIPASE, AMYLASE in the last 168 hours. No results for input(s): AMMONIA in the last 168 hours. CBC:  Recent Labs Lab 05/17/14 1658 05/17/14 2125 05/18/14 0538 05/19/14 0623 05/19/14 1641 05/20/14 0750  WBC 9.9 11.1* 9.2 14.7* 14.1* 14.4*  NEUTROABS 7.9* 9.6*  --   --   --  11.0*  HGB 12.1 11.4* 10.4* 8.9* 8.4* 7.6*  HCT 38.1 35.2* 32.0* 26.8* 25.2* 23.0*  MCV 93.6 92.9 93.0 93.1 93.0 91.6  PLT 163 172 183 159 155 154   Cardiac Enzymes: No results for input(s): CKTOTAL, CKMB, CKMBINDEX, TROPONINI in the last 168 hours. BNP (last 3 results) No results for input(s): PROBNP in the last 8760 hours. CBG:  Recent Labs Lab  05/19/14 1639 05/19/14 2106 05/20/14 0725 05/20/14 1158 05/20/14 1625  GLUCAP 254* 209* 166* 210* 157*    No results found for this or any previous visit (from the past 240 hour(s)).   Studies: No results found.  Scheduled Meds: . acetaminophen  650 mg Oral Once  . albuterol  2.5 mg Nebulization TID  . cloNIDine  0.2 mg Oral BID  . [START ON 06/03/2014] Darbepoetin Alfa  100 mcg Subcutaneous Q5 weeks  . [START ON 05/21/2014] enoxaparin (LOVENOX) injection  40 mg Subcutaneous Q24H  . furosemide  20 mg Intravenous BID  . insulin aspart  0-5 Units Subcutaneous QHS  . insulin aspart  0-9 Units Subcutaneous TID WC  . insulin detemir  10 Units Subcutaneous QHS  . levothyroxine  25 mcg Intravenous Daily  . metoprolol  5 mg Intravenous 4 times per day  . ondansetron (ZOFRAN) IV  4 mg Intravenous 4 times per day  . pantoprazole  40 mg Oral BID  . polyethylene glycol  17 g Oral Daily   Continuous Infusions: . sodium chloride 100 mL/hr at 05/19/14 1446    Principal Problem:   Closed right hip fracture Active Problems:   Essential hypertension   Diabetes   Hip fracture requiring operative repair   Hypothyroidism   Intertrochanteric fracture of right femur   Fall    Time spent: 67mins    Taiwo Fish, [facility]  [facility] Pager [phone]. If 7PM-7AM, please contact night-coverage at [email or web address], password [facility] 05/20/2014, 5:51 PM  LOS: 3 days

## 2014-05-20 NOTE — Care Management Utilization Note (Signed)
UR complete 

## 2014-05-20 NOTE — Clinical Social Work Psychosocial (Signed)
Clinical Social Work Department BRIEF PSYCHOSOCIAL ASSESSMENT 05/20/2014  Patient:  [name], [name]     Account Number:  [id number]     Admit date:  05/17/2014  Clinical Social Worker:  Daiva Huge  Date/Time:  05/20/2014 01:59 PM  Referred by:  Physician  Date Referred:  05/20/2014 Referred for  SNF Placement   Other Referral:   Interview type:  Other - See comment Other interview type:   Met with patient and husband at bedside    PSYCHOSOCIAL DATA Living Status:  FAMILY Admitted from facility:   Level of care:   Primary support name:  husband Primary support relationship to patient:  FAMILY Degree of support available:   good    CURRENT CONCERNS Current Concerns  Post-Acute Placement   Other Concerns:    SOCIAL WORK ASSESSMENT / PLAN CSW met with patient and her husband at bedside to discuss possible SNF- patient reports falling and breaking her hip- she has been to SNF in the past last year after her other LE was broken-  "If I have to go, I'd like to go back to [place]".  Patient and husband are eager to get the rehab underway in anticipation of getting her home soon- "the doctor said 12 weeks" CSW advised them of Medicare coverage/guidelines and the likelihood she can transition home with [facility] care once moving well enough to be safe at home.   Assessment/plan status:  Other - See comment Other assessment/ plan:   FL2 and PASARR for SNF search   Information/referral to community resources:   SNF list    PATIENT'S/FAMILY'S RESPONSE TO PLAN OF CARE: Both patient and her husband are hopeful for a SNF bed at [facility] where she was last year for rehab- CSW will proceed with SNF search and advise.  Patient and her husband were appreciative of assistance.       Eduard Clos, MSW, [place]

## 2014-05-20 NOTE — Evaluation (Signed)
Occupational Therapy Evaluation Patient Details Name: [name] MRN: [id number] DOB: [birth year] Today's Date: 05/20/2014    History of Present Illness With a history of hypertension, and type 2 diabetes with diabetic retinopathy and stage III chronic kidney disease, anemia secondary to chronic kidney disease, hyperlipidemia, depression, hypothyroidism.  She presents to the hospital by EMS due to sudden onset of right hip pain following a fall. The patient was sitting down and her leg gave out when she went to stand up. She fell striking her right hip on the ground. She was unable to place any weight on it although she was not in a lot of pain. She was brought to the emergency department and was found to have a right closed, comminuted hip fracture.   Clinical Impression   Pt is presenting to acute OT with above situation.  She has generalized weakness in BUE, but is able to complete needed ADL tasks in sitting.  Pt will have increased difficulty with standing and LE tasks at this time due to pain with movement and weightbearing.  Recommend continued OT services at SNF for improvements in ADL and IADL status.  Pt was independent prior to admission.    Follow Up Recommendations  SNF    Equipment Recommendations  Other (comment) (Defer to SNF)    Recommendations for Other Services       Precautions / Restrictions Precautions Precautions: Fall Restrictions Weight Bearing Restrictions: Yes RLE Weight Bearing: Weight bearing as tolerated      Mobility Bed Mobility Overal bed mobility: Needs Assistance Bed Mobility: Sit to Supine       Sit to supine: Max assist   General bed mobility comments: assist needed to support trunk and both LEs into the bed  Transfers Overall transfer level: Needs assistance Equipment used: Rolling walker (2 wheeled) Transfers: Sit to/from [facility] Sit to Stand: Mod assist Stand pivot transfers: Mod assist       General  transfer comment: pt able to use walker to transfer bed to chair with WBAT right...she is able to move RLE actively to step but has difficutly off loading weight from the LLE and scoots that foot on the floor to pivot    Balance Overall balance assessment: No apparent balance deficits (not formally assessed)                                          ADL Overall ADL's : Needs assistance/impaired             Lower Body Bathing: Moderate assistance       Lower Body Dressing: Moderate assistance                 General ADL Comments: Increased assist needed with all LE ADLs and all ADLs in standing     Vision                     Perception     Praxis      Pertinent Vitals/Pain Pain Assessment: No/denies pain     Hand Dominance Right   Extremity/Trunk Assessment Upper Extremity Assessment Upper Extremity Assessment: Generalized weakness;Overall [facility] for tasks assessed   Lower Extremity Assessment Lower Extremity Assessment: Defer to PT evaluation       Communication Communication Communication: No difficulties   Cognition Arousal/Alertness: Awake/alert Behavior During Therapy: WFL for tasks assessed/performed Overall Cognitive  Status: Within Functional Limits for tasks assessed                     General Comments       Exercises       Shoulder Instructions      Home Living Family/patient expects to be discharged to:: Skilled nursing facility                   Bathroom Shower/Tub: Tub/shower unit   Bathroom Toilet: Handicapped height                Prior Functioning/Environment Level of Independence: Independent             OT Diagnosis: Generalized weakness (decreased ADL status)   OT Problem List: Decreased strength;Decreased coordination;Decreased knowledge of use of DME or AE;Pain (decreased ADL status)   OT Treatment/Interventions: Self-care/ADL training;Therapeutic exercise;Balance  training;Energy conservation;DME and/or AE instruction;Therapeutic activities    OT Goals(Current goals can be found in the care plan section) Acute Rehab OT Goals Patient Stated Goal: to get well and come home OT Goal Formulation: With patient Time For Goal Achievement: 06/03/14 Potential to Achieve Goals: Good ADL Goals Pt Will Transfer to Toilet: with min assist Pt/caregiver will Perform Home Exercise Program: Increased strength;Both right and left upper extremity Additional ADL Goal #1: Pt will be educated on LE dressing and bathing techniques   OT Frequency: Min 2X/week   Barriers to D/C:            Co-evaluation              End of Session    Activity Tolerance: Patient tolerated treatment well Patient left: in bed;with bed alarm set   Time: [id number] OT Time Calculation (min): 11 min Charges:  OT General Charges $OT Visit: 1 Procedure OT Evaluation $Initial OT Evaluation Tier I: 1 Procedure G-Codes:     Bea Graff Dereka Lueras, MS, OTR/L [place] [phone] 05/20/2014, 4:35 PM

## 2014-05-20 NOTE — Anesthesia Postprocedure Evaluation (Signed)
  Anesthesia Post-op Note  Patient: [name]  Procedure(s) Performed: Procedure(s): COMPRESSION HIP (Right)  Patient Location: Nursing Unit  Anesthesia Type:General  Level of Consciousness: awake and patient cooperative  Airway and Oxygen Therapy: Patient Spontanous Breathing  Post-op Pain: mild  Post-op Assessment: Post-op Vital signs reviewed, Patient's Cardiovascular Status Stable, Respiratory Function Stable, Patent Airway and No signs of Nausea or vomiting  Post-op Vital Signs: Reviewed and stable    Complications: No apparent anesthesia complications. Nausea yesterday, resolved today. Starting PT now.

## 2014-05-20 NOTE — Care Management Note (Addendum)
    Page 1 of 1   05/23/2014     12:49:11 PM CARE MANAGEMENT NOTE 05/23/2014  Patient:  [name], [name]   Account Number:  [id number]  Date Initiated:  05/20/2014  Documentation initiated by:  Jolene Provost  Subjective/Objective Assessment:   Pt is from home, lives with husband. Pt is has [place] RN for wound care. Pt plans to discharge to SNF per PT's recommendations for rehab. CSW aware of discharge plan and will arrange for placement at SNF. No CM needs at this time.     Action/Plan:   Anticipated DC Date:  05/20/2014   Anticipated DC Plan:  SKILLED NURSING FACILITY  In-house referral  Clinical Social Worker      DC Planning Services  CM consult      Choice offered to / List presented to:             Status of service:  Completed, signed off Medicare Important Message given?  YES (If response is "NO", the following Medicare IM given date fields will be blank) Date Medicare IM given:  05/23/2014 Medicare IM given by:  Jolene Provost Date Additional Medicare IM given:   Additional Medicare IM given by:    Discharge Disposition:  [place]  Per UR Regulation:  Reviewed for med. necessity/level of care/duration of stay  If discussed at [place] of Stay Meetings, dates discussed:    Comments:  05/20/2014 [place], RN, MSN, [facility]

## 2014-05-21 ENCOUNTER — Inpatient Hospital Stay (HOSPITAL_COMMUNITY): Payer: Medicare Other

## 2014-05-21 ENCOUNTER — Encounter (HOSPITAL_COMMUNITY): Payer: Self-pay | Admitting: Orthopedic Surgery

## 2014-05-21 ENCOUNTER — Encounter (HOSPITAL_COMMUNITY): Payer: Medicare Other

## 2014-05-21 DIAGNOSIS — G92 Toxic encephalopathy: Secondary | ICD-10-CM

## 2014-05-21 DIAGNOSIS — S72009D Fracture of unspecified part of neck of unspecified femur, subsequent encounter for closed fracture with routine healing: Secondary | ICD-10-CM

## 2014-05-21 DIAGNOSIS — N10 Acute tubulo-interstitial nephritis: Secondary | ICD-10-CM

## 2014-05-21 LAB — TYPE AND SCREEN
ABO/RH(D): O POS
Antibody Screen: NEGATIVE
UNIT DIVISION: 0
UNIT DIVISION: 0
Unit division: 0
Unit division: 0
Unit division: 0

## 2014-05-21 LAB — CBC WITH DIFFERENTIAL/PLATELET
BASOS ABS: 0 10*3/uL (ref 0.0–0.1)
Basophils Relative: 0 % (ref 0–1)
EOS ABS: 0.1 10*3/uL (ref 0.0–0.7)
Eosinophils Relative: 1 % (ref 0–5)
HEMATOCRIT: 32.2 % — AB (ref 36.0–46.0)
Hemoglobin: 10.7 g/dL — ABNORMAL LOW (ref 12.0–15.0)
LYMPHS PCT: 14 % (ref 12–46)
Lymphs Abs: 1.7 10*3/uL (ref 0.7–4.0)
MCH: 28.5 pg (ref 26.0–34.0)
MCHC: 33.2 g/dL (ref 30.0–36.0)
MCV: 85.9 fL (ref 78.0–100.0)
MONO ABS: 0.9 10*3/uL (ref 0.1–1.0)
Monocytes Relative: 8 % (ref 3–12)
Neutro Abs: 9.3 10*3/uL — ABNORMAL HIGH (ref 1.7–7.7)
Neutrophils Relative %: 77 % (ref 43–77)
Platelets: 156 10*3/uL (ref 150–400)
RBC: 3.75 MIL/uL — ABNORMAL LOW (ref 3.87–5.11)
RDW: 18.5 % — ABNORMAL HIGH (ref 11.5–15.5)
WBC: 12 10*3/uL — AB (ref 4.0–10.5)

## 2014-05-21 LAB — BLOOD GAS, ARTERIAL
Acid-Base Excess: 0.1 mmol/L (ref 0.0–2.0)
Bicarbonate: 23.6 mEq/L (ref 20.0–24.0)
Drawn by: 38235
FIO2: 0.21 %
O2 Saturation: 94.7 %
PATIENT TEMPERATURE: 37
PH ART: 7.458 — AB (ref 7.350–7.450)
TCO2: 21.7 mmol/L (ref 0–100)
pCO2 arterial: 33.8 mmHg — ABNORMAL LOW (ref 35.0–45.0)
pO2, Arterial: 67.8 mmHg — ABNORMAL LOW (ref 80.0–100.0)

## 2014-05-21 LAB — BASIC METABOLIC PANEL
Anion gap: 8 (ref 5–15)
BUN: 29 mg/dL — AB (ref 6–23)
CO2: 26 mmol/L (ref 19–32)
CREATININE: 1.43 mg/dL — AB (ref 0.50–1.10)
Calcium: 8.4 mg/dL (ref 8.4–10.5)
Chloride: 106 mEq/L (ref 96–112)
GFR calc Af Amer: 43 mL/min — ABNORMAL LOW (ref >90)
GFR, EST NON AFRICAN AMERICAN: 37 mL/min — AB (ref >90)
GLUCOSE: 128 mg/dL — AB (ref 70–99)
Potassium: 3.8 mmol/L (ref 3.5–5.1)
SODIUM: 140 mmol/L (ref 135–145)

## 2014-05-21 LAB — URINALYSIS, ROUTINE W REFLEX MICROSCOPIC
Bilirubin Urine: NEGATIVE
Glucose, UA: NEGATIVE mg/dL
Ketones, ur: NEGATIVE mg/dL
Nitrite: NEGATIVE
Protein, ur: NEGATIVE mg/dL
Specific Gravity, Urine: 1.025 (ref 1.005–1.030)
UROBILINOGEN UA: 0.2 mg/dL (ref 0.0–1.0)
pH: 5.5 (ref 5.0–8.0)

## 2014-05-21 LAB — AMMONIA

## 2014-05-21 LAB — HEPATIC FUNCTION PANEL
ALT: 15 U/L (ref 0–35)
AST: 49 U/L — ABNORMAL HIGH (ref 0–37)
Albumin: 2.9 g/dL — ABNORMAL LOW (ref 3.5–5.2)
Alkaline Phosphatase: 65 U/L (ref 39–117)
BILIRUBIN DIRECT: 0.3 mg/dL (ref 0.0–0.3)
BILIRUBIN INDIRECT: 1.2 mg/dL — AB (ref 0.3–0.9)
BILIRUBIN TOTAL: 1.5 mg/dL — AB (ref 0.3–1.2)
Total Protein: 6.2 g/dL (ref 6.0–8.3)

## 2014-05-21 LAB — GLUCOSE, CAPILLARY
Glucose-Capillary: 136 mg/dL — ABNORMAL HIGH (ref 70–99)
Glucose-Capillary: 187 mg/dL — ABNORMAL HIGH (ref 70–99)
Glucose-Capillary: 168 mg/dL — ABNORMAL HIGH (ref 70–99)
Glucose-Capillary: 213 mg/dL — ABNORMAL HIGH (ref 70–99)

## 2014-05-21 LAB — URINE MICROSCOPIC-ADD ON

## 2014-05-21 MED ORDER — LORAZEPAM 2 MG/ML IJ SOLN
1.0000 mg | Freq: Once | INTRAMUSCULAR | Status: AC
  Administered 2014-05-21: 1 mg via INTRAVENOUS
  Filled 2014-05-21: qty 1

## 2014-05-21 MED ORDER — LEVOTHYROXINE SODIUM 75 MCG PO TABS
75.0000 ug | ORAL_TABLET | Freq: Every day | ORAL | Status: DC
  Administered 2014-05-22 – 2014-05-23 (×2): 75 ug via ORAL
  Filled 2014-05-21 (×2): qty 1

## 2014-05-21 NOTE — Progress Notes (Signed)
[facility] PROGRESS NOTE  [name] [id number] DOB: Jun 04, 1945 DOA: 05/17/2014 PCP: Redge Gainer, MD  68 y/o h/o L hip # 10/12/12 s/p repair, DM ty 2, Htn, Hld, CKD II-III, Prior UGIB from [facility] [? 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  She became nauseous day of surgery and after surgery and was not able to keep down anything orally. She was found to have some blood tinged emesis and kept nothing by mouth except for sips with ice chips. SHe was found to have a HB of ~ 7.6 on 12/28 and transfused 2 U PRBC.  Subsequently she then started having hallucinations overnight. Ammonia level was less than 9 and it was noted that she was seeing small children and things that are not there. Note that patient had received overnight with transfusion Benadryl , Phenergan  Ambien as well as IV morphine. Urine analysis was performed, chest x-ray was performed  Assessment/Plan: 1. Toxic metabolic encephalopathy-unclear etiology, potentially infectious?-Potential UTI/pyelonephritis?Marland Kitchen Most likely iatrogenic secondary to multiple sedating agents as well as opiates. Monitor. Hopefully will clear. I've discussed this with her husband 2. Possible sepsis- see above discussion, white count 12. Over the past 2 days has been in the 14 range. Patient has been afebrile otherwise which is somewhat confounding. Obtain chest x-ray, urine analysis and culture 3. Nausea/vomiting-potential Mallory-Weiss tear-her hematocrit dropped to 7.612/28 she was transfused 2 units packed red blood cells. Patient tolerated a full diet 12/29 without event. Clinically quite sent at present time do not need GI input 4. Acute blood loss anemia multifactorial-status post hip surgery/status secondary to #1-transfused 2 units. Repeat CBC in a.m. 5. Right Hip fracture status  post repair 12/26-will need skilled nursing care as per physical occupational therapy 6. Hypertension-resume by mouth medications at lower than home dosage of 25 mg Toprol-XL, losartan 25 mg 7. Diabetes. Held Amaryl given vomiting, continue sliding scale insulin. Continue to monitor-blood sugars have been 187-136 today today so we will continue Levemir 10 units 8. Hypothyroidism. Patient was on Synthroid IV 25 g which is a lower dose than her 75 mg orally. I restarted her oral dosing. I do not think at this stage and TSH would be of any benefit as her delirium is likely secondary to medications. If this persists I will pursue a TSH/T4 9. GERD. Continue Protonix has IV twice a day  Code Status: full code Family Communication: discussed with husband at the bedside Disposition Plan: pending resolution of encephalopathy SNF   Consultants:  orthopedics  Procedures:  Open treatment, internal fixation of right hip  Antibiotics:    HPI/Subjective:  Very confused overnight, note that patient was given Ativan in addition to multiple other medications as above. Much more clearer this morning Oriented can tell me where she has can remember why was and what we discussed yesterday and does not seem to be confused at all. Voices no specific complaint of chest pain and confusion or unilateral week(  Objective: Filed Vitals:   05/21/14 1109  BP:   Pulse:   Temp:   Resp: 20    Intake/Output Summary (Last 24 hours) at 05/21/14 1358 Last data filed at 05/21/14 1233  Gross per 24 hour  Intake 1567.5 ml  Output    300 ml  Net 1267.5 ml   Filed Weights   05/17/14 1534 05/17/14 2006  Weight: 74.844 kg (165  lb) 87.181 kg (192 lb 3.2 oz)    Exam:   General:  NAD  Cardiovascular: S1, S2 RRR  Respiratory: CTAB, clinically clear  Abdomen: Soft, nt,nd, bs+ no abdominal discomfort  Musculoskeletal: no edema b/l   Grossly intact moves all 4 limbs equally reflexes intact smile  symmetric  Data Reviewed: Basic Metabolic Panel:  Recent Labs Lab 05/17/14 2125 05/18/14 0538 05/19/14 0623 05/20/14 0750 05/21/14 0611  NA 137 139 138 138 140  K 5.2* 4.7 4.4 3.8 3.8  CL 106 108 108 112 106  CO2 24 25 20 19 26   GLUCOSE 302* 234* 374* 167* 128*  BUN 35* 38* 35* 27* 29*  CREATININE 1.58* 1.61* 1.47* 1.11* 1.43*  CALCIUM 9.0 8.7 8.4 8.0* 8.4   Liver Function Tests:  Recent Labs Lab 05/17/14 1658 05/17/14 2125 05/20/14 0750  AST 20 19 36  ALT 11 11 12   ALKPHOS 115 101 65  BILITOT 0.6 0.6 0.6  PROT 7.1 6.8 5.7*  ALBUMIN 3.7 3.5 2.9*   No results for input(s): LIPASE, AMYLASE in the last 168 hours.  Recent Labs Lab 05/21/14 1037  AMMONIA <9*   CBC:  Recent Labs Lab 05/17/14 1658 05/17/14 2125 05/18/14 0538 05/19/14 0623 05/19/14 1641 05/20/14 0750 05/21/14 0611  WBC 9.9 11.1* 9.2 14.7* 14.1* 14.4* 12.0*  NEUTROABS 7.9* 9.6*  --   --   --  11.0* 9.3*  HGB 12.1 11.4* 10.4* 8.9* 8.4* 7.6* 10.7*  HCT 38.1 35.2* 32.0* 26.8* 25.2* 23.0* 32.2*  MCV 93.6 92.9 93.0 93.1 93.0 91.6 85.9  PLT 163 172 183 159 155 154 156   Cardiac Enzymes: No results for input(s): CKTOTAL, CKMB, CKMBINDEX, TROPONINI in the last 168 hours. BNP (last 3 results) No results for input(s): PROBNP in the last 8760 hours. CBG:  Recent Labs Lab 05/20/14 1158 05/20/14 1625 05/20/14 2018 05/21/14 0721 05/21/14 1108  GLUCAP 210* 157* 146* 136* 187*    No results found for this or any previous visit (from the past 240 hour(s)).   Studies: No results found.  Scheduled Meds: . cloNIDine  0.2 mg Oral BID  . [START ON 06/03/2014] Darbepoetin Alfa  100 mcg Subcutaneous Q5 weeks  . enoxaparin (LOVENOX) injection  40 mg Subcutaneous Q24H  . insulin aspart  0-5 Units Subcutaneous QHS  . insulin aspart  0-9 Units Subcutaneous TID WC  . insulin detemir  10 Units Subcutaneous QHS  . levothyroxine  25 mcg Intravenous Daily  . losartan  25 mg Oral Daily  . metoprolol  succinate  25 mg Oral Daily  . ondansetron (ZOFRAN) IV  4 mg Intravenous 4 times per day  . pantoprazole  40 mg Oral BID  . polyethylene glycol  17 g Oral Daily   Continuous Infusions: . sodium chloride 100 mL/hr at 05/19/14 1446    Principal Problem:   Closed right hip fracture Active Problems:   Essential hypertension   Diabetes   Hip fracture requiring operative repair   Hypothyroidism   Intertrochanteric fracture of right femur   Fall    Time spent: 34mins    Abem Shaddix, [facility]  [facility] Pager [phone]. If 7PM-7AM, please contact night-coverage at [email or web address], password [facility] 05/21/2014, 1:58 PM  LOS: 4 days

## 2014-05-21 NOTE — BH Assessment (Signed)
Spoke with [place] floor nurse who reports that patient is currently in a procedure getting a CT scan and is not available for Telepsych consult at this time. Informed Bevely Palmer that TP consult is scheduled for 1725.   Shaune Pollack, MS, [place] Assessment Counselor

## 2014-05-21 NOTE — Progress Notes (Signed)
Pt is very anxious at this time and insists she needs to leave. Dr. Ernestina Patches notified. Order for 1 mg ativan IV once given. Will continue to monitor.

## 2014-05-21 NOTE — Progress Notes (Signed)
Pt confused. Attempting to get out of bed. Bed alarm going off. Pt states she needs to leave and needs to go get her clothes. Pt reoriented and repositioned. Bed alarm on. Pt states she promises she will stay in the bed. Will continue to monitor.

## 2014-05-21 NOTE — Progress Notes (Signed)
Tele psych called. No answer. Previous nurse states that she called earlier today with no answer. Pt seems stable at this time with husband at the bedside. Will continue to monitor.

## 2014-05-21 NOTE — Clinical Social Work Note (Signed)
Pt and pt's husband accept bed at [facility]. Facility notified and will contact husband regarding paperwork. Possible d/c later today or tomorrow. CSW will follow.  Benay Pike, [place]

## 2014-05-21 NOTE — Clinical Social Work Placement (Signed)
Clinical Social Work Department CLINICAL SOCIAL WORK PLACEMENT NOTE 05/21/2014  Patient:  [name], [name]  Account Number:  [id number] Admit date:  05/17/2014  Clinical Social Worker:  Daiva Huge  Date/time:  05/20/2014 02:06 PM  Clinical Social Work is seeking post-discharge placement for this patient at the following level of care:   SKILLED NURSING   (*CSW will update this form in Epic as items are completed)   05/20/2014  Patient/family provided with [place] Department of Clinical Social Work's list of facilities offering this level of care within the geographic area requested by the patient (or if unable, by the patient's family).  05/20/2014  Patient/family informed of their freedom to choose among providers that offer the needed level of care, that participate in Medicare, Medicaid or managed care program needed by the patient, have an available bed and are willing to accept the patient.  05/20/2014  Patient/family informed of [facility]' ownership interest in [facility], as well as of the fact that they are under no obligation to receive care at this facility.  PASARR submitted to EDS on 05/20/2014 PASARR number received on 05/20/2014  FL2 transmitted to all facilities in geographic area requested by pt/family on  05/20/2014 FL2 transmitted to all facilities within larger geographic area on   Patient informed that his/her managed care company has contracts with or will negotiate with  certain facilities, including the following:     Patient/family informed of bed offers received:  05/21/2014 Patient chooses bed at [facility] SN[facility] Physician recommends and patient chooses bed at  [facility] SN[facility]  Patient to be transferred to  on   Patient to be transferred to facility by  Patient and family notified of transfer on  Name of family member notified:    The following physician request were entered in Epic:   Additional  Comments:  Benay Pike, [place]

## 2014-05-21 NOTE — Progress Notes (Signed)
Physical Therapy Treatment Patient Details Name: [name] MRN: [id number] DOB: [birth year] Today's Date: 05/21/2014    History of Present Illness With a history of hypertension, and type 2 diabetes with diabetic retinopathy and stage III chronic kidney disease, anemia secondary to chronic kidney disease, hyperlipidemia, depression, hypothyroidism.  She presents to the hospital by EMS due to sudden onset of right hip pain following a fall. The patient was sitting down and her leg gave out when she went to stand up. She fell striking her right hip on the ground. She was unable to place any weight on it although she was not in a lot of pain. She was brought to the emergency department and was found to have a right closed, comminuted hip fracture.    PT Comments     Pt was reported to be quite agitated and confused this morning.  I deferred PT visit until after her lunch.  Sitter states that she had no sleep at all last night.  Currently pt continues to be confused and easily agitated.  She is hallucinating to a small degree and exhibits a bit of paranoia.  She was able to cooperate with PT, however, and able to follow all directions.  She instructed in WBAT right gait with a walker and was able to walk about 4' with min assist.  She was able to participate in exercise per hip protocol.    Follow Up Recommendations  SNF     Equipment Recommendations  None recommended by PT    Recommendations for Other Services  OT     Precautions / Restrictions Precautions Precautions: Fall Restrictions Weight Bearing Restrictions: Yes RLE Weight Bearing: Weight bearing as tolerated    Mobility  Bed Mobility Overal bed mobility: Needs Assistance Bed Mobility: Supine to Sit       Sit to supine: Max assist   General bed mobility comments: assist needed to support trunk and both LEs into the bed  Transfers Overall transfer level: Needs assistance Equipment used: Rolling walker (2  wheeled) Transfers: Sit to/from Stand Sit to Stand: Min assist            Ambulation/Gait Ambulation/Gait assistance: Min assist Ambulation Distance (Feet): 4 Feet Assistive device: Rolling walker (2 wheeled) Gait Pattern/deviations: Step-to pattern;Decreased step length - right;Decreased stance time - right Gait velocity: appropriate for the situation Gait velocity interpretation: Below normal speed for age/gender General Gait Details: pt is now able to take a step with each foot, elevating it off the floor slightly....   Stairs            Wheelchair Mobility    Modified Rankin (Stroke Patients Only)       Balance Overall balance assessment: No apparent balance deficits (not formally assessed)                                  Cognition Arousal/Alertness: Awake/alert Behavior During Therapy: Agitated Overall Cognitive Status: Impaired/Different from baseline Area of Impairment: Memory               General Comments: pt appears to be somewhat confused and is hallucinating to a small degree    Exercises General Exercises - Lower Extremity Ankle Circles/Pumps: AROM;Both;10 reps;Supine Quad Sets: AROM;Both;10 reps;Supine Gluteal Sets: AROM;Both;10 reps;Supine Short Arc Quad: AROM;AAROM;Both;10 reps;Supine Heel Slides: AROM;AAROM;Both;10 reps;Supine Hip ABduction/ADduction: AROM;AAROM;Both;10 reps;Supine    General Comments        Pertinent Vitals/Pain  Pain Assessment: No/denies pain    Home Living Family/patient expects to be discharged to:: Skilled nursing facility Living Arrangements: Spouse/significant other                  Prior Function Level of Independence: Independent          PT Goals (current goals can now be found in the care plan section) Progress towards PT goals: Progressing toward goals    Frequency  Min 6X/week    PT Plan Current plan remains appropriate    Co-evaluation             End of  Session Equipment Utilized During Treatment: Gait belt Activity Tolerance: Patient tolerated treatment well Patient left: in bed;with call bell/phone within reach;with nursing/sitter in room;with bed alarm set     Time: [id number] PT Time Calculation (min) (ACUTE ONLY): 28 min  Charges:  $Therapeutic Exercise: 8-22 mins                    G Codes:      Sable Feil 06/10/2014, 1:02 PM

## 2014-05-22 ENCOUNTER — Inpatient Hospital Stay (HOSPITAL_COMMUNITY): Payer: Medicare Other

## 2014-05-22 DIAGNOSIS — F29 Unspecified psychosis not due to a substance or known physiological condition: Secondary | ICD-10-CM

## 2014-05-22 DIAGNOSIS — R410 Disorientation, unspecified: Secondary | ICD-10-CM

## 2014-05-22 LAB — PROCALCITONIN: Procalcitonin: 0.18 ng/mL

## 2014-05-22 LAB — BASIC METABOLIC PANEL
Anion gap: 8 (ref 5–15)
BUN: 34 mg/dL — AB (ref 6–23)
CHLORIDE: 105 meq/L (ref 96–112)
CO2: 27 mmol/L (ref 19–32)
Calcium: 8.1 mg/dL — ABNORMAL LOW (ref 8.4–10.5)
Creatinine, Ser: 1.45 mg/dL — ABNORMAL HIGH (ref 0.50–1.10)
GFR calc Af Amer: 42 mL/min — ABNORMAL LOW (ref >90)
GFR calc non Af Amer: 36 mL/min — ABNORMAL LOW (ref >90)
Glucose, Bld: 83 mg/dL (ref 70–99)
Potassium: 3.8 mmol/L (ref 3.5–5.1)
Sodium: 140 mmol/L (ref 135–145)

## 2014-05-22 LAB — CBC
HEMATOCRIT: 31.7 % — AB (ref 36.0–46.0)
Hemoglobin: 10.3 g/dL — ABNORMAL LOW (ref 12.0–15.0)
MCH: 28.4 pg (ref 26.0–34.0)
MCHC: 32.5 g/dL (ref 30.0–36.0)
MCV: 87.3 fL (ref 78.0–100.0)
Platelets: 156 10*3/uL (ref 150–400)
RBC: 3.63 MIL/uL — ABNORMAL LOW (ref 3.87–5.11)
RDW: 18.2 % — ABNORMAL HIGH (ref 11.5–15.5)
WBC: 9.4 10*3/uL (ref 4.0–10.5)

## 2014-05-22 LAB — LACTIC ACID, PLASMA: Lactic Acid, Venous: 1 mmol/L (ref 0.5–2.2)

## 2014-05-22 LAB — GLUCOSE, CAPILLARY
Glucose-Capillary: 94 mg/dL (ref 70–99)
Glucose-Capillary: 156 mg/dL — ABNORMAL HIGH (ref 70–99)
Glucose-Capillary: 258 mg/dL — ABNORMAL HIGH (ref 70–99)
Glucose-Capillary: 225 mg/dL — ABNORMAL HIGH (ref 70–99)

## 2014-05-22 MED ORDER — HYDRALAZINE HCL 25 MG PO TABS
50.0000 mg | ORAL_TABLET | Freq: Three times a day (TID) | ORAL | Status: DC
  Administered 2014-05-22 – 2014-05-23 (×3): 50 mg via ORAL
  Filled 2014-05-22 (×3): qty 2

## 2014-05-22 MED ORDER — IOHEXOL 350 MG/ML SOLN
64.0000 mL | Freq: Once | INTRAVENOUS | Status: AC | PRN
  Administered 2014-05-22: 80 mL via INTRAVENOUS

## 2014-05-22 MED ORDER — CEFTRIAXONE SODIUM IN DEXTROSE 20 MG/ML IV SOLN
1.0000 g | INTRAVENOUS | Status: DC
  Administered 2014-05-22: 1 g via INTRAVENOUS
  Filled 2014-05-22 (×6): qty 50

## 2014-05-22 MED ORDER — LEVOFLOXACIN 750 MG PO TABS
750.0000 mg | ORAL_TABLET | ORAL | Status: DC
  Administered 2014-05-22: 750 mg via ORAL
  Filled 2014-05-22: qty 1

## 2014-05-22 NOTE — Progress Notes (Signed)
[facility] for Levaquin Indication: pneumonia HCAP  Allergies  Allergen Reactions  . Aspirin     vomiting  . Ciprofloxacin     unknown  . Codeine     Makes me sick   . Micardis [Telmisartan]     unknown  . Rofecoxib     unknown   Patient Measurements: Height: 5\' 2"  (157.5 cm) Weight: 192 lb 3.2 oz (87.181 kg) IBW/kg (Calculated) : 50.1  Vital Signs: Temp: 97.9 F (36.6 C) (12/30 0558) Temp Source: Oral (12/30 0558) BP: 173/62 mmHg (12/30 0558) Pulse Rate: 82 (12/30 0836) Intake/Output from previous day: 12/29 0701 - 12/30 0700 In: 720 [P.O.:720] Out: 500 [Urine:500] Intake/Output from this shift: Total I/O In: 240 [P.O.:240] Out: 350 [Urine:350]  Labs:  Recent Labs  05/20/14 0750 05/21/14 0611 05/22/14 0620  WBC 14.4* 12.0* 9.4  HGB 7.6* 10.7* 10.3*  PLT 154 156 156  CREATININE 1.11* 1.43* 1.45*   Estimated Creatinine Clearance: 38 mL/min (by C-G formula based on Cr of 1.45). No results for input(s): VANCOTROUGH, VANCOPEAK, VANCORANDOM, GENTTROUGH, GENTPEAK, GENTRANDOM, TOBRATROUGH, TOBRAPEAK, TOBRARND, AMIKACINPEAK, AMIKACINTROU, AMIKACIN in the last 72 hours.   Microbiology: No results found for this or any previous visit (from the past 720 hour(s)).  Anti-infectives    Start     Dose/Rate Route Frequency Ordered Stop   05/22/14 0900  cefTRIAXone (ROCEPHIN) 1 g in dextrose 5 % 50 mL IVPB - Premix  Status:  Discontinued     1 g100 mL/hr over 30 Minutes Intravenous Every 24 hours 05/22/14 0736 05/22/14 1203   05/19/14 0000  vancomycin (VANCOCIN) IVPB 1000 mg/200 mL premix     1,000 mg200 mL/hr over 60 Minutes Intravenous Every 12 hours 05/18/14 1535 05/19/14 0253   05/18/14 1145  vancomycin (VANCOCIN) IVPB 1000 mg/200 mL premix  Status:  Discontinued     1,000 mg200 mL/hr over 60 Minutes Intravenous NOW 05/18/14 1140 05/18/14 1535   05/18/14 0600  vancomycin (VANCOCIN) IVPB 1000 mg/200 mL premix     1,000 mg200 mL/hr over  60 Minutes Intravenous On call to O.R. 05/17/14 2038 05/18/14 1157      Assessment: Okay for Protocol, right upper lobe pneumonia on CT scan-potentially secondary to aspiration and pyelonephritis-await urine culture. Levaquin by mouth for now.  Cipro intolerance (upset stomach) noted.  Goal of Therapy:  Eradicate infection.  Plan:  Levaquin 750mg  PO every 48 hours. Monitor renal labs Follow up culture results  [name] 05/22/2014,12:19 PM

## 2014-05-22 NOTE — Consult Note (Signed)
Telepsych Consultation   Reason for Consult:  Acute psychosis Referring Physician:  Attending physician  [name] is an 68 y.o. female.  Assessment: AXIS I:  R/o delirium  AXIS II:  Deferred AXIS III:   Past Medical History  Diagnosis Date  . Hypertension   . Hyperlipidemia   . GERD (gastroesophageal reflux disease)   . GAD (generalized anxiety disorder)   . GIB (gastrointestinal bleeding)   . CKD (chronic kidney disease), stage III   . Peripheral edema   . Anemia   . Colon polyps   . Coronary artery disease   . Hypothyroidism   . Type II diabetes mellitus   . History of blood transfusion 10/11/2012    "2 units yesterday; first time ever" (10/12/2012)  . Gout   . Anxiety   . Arthritis     knees and hand/fingers   AXIS IV: none AXIS V:  61-70 mild symptoms  Plan:  No evidence of imminent risk to self or others at present.   Patient does not meet criteria for psychiatric inpatient admission.  Subjective:   [name] is a 68 y.o. female patient who is status post right hip fracture/ repair.  According to notes by  Nita Sells, MD  On 12/28 patient  was found to have a HB of ~ 7.6  and transfused 2 U PRBC. Subsequently she then started having hallucinations overnight. Ammonia level was less than 9 and it was noted that she was seeing small children and things that are not there. Note that patient had received overnight with transfusion Benadryl , Phenergan Ambien as well as IV morphine    HPI:  Today patient presents as coherent, logical and goal directed in conversation. She is alert and oriented x 4;  Recent and remote memory appear intact.  Patient states she is feeling better today and is reporting that she is less confused.  Denies any auditory or visual hallucination.  She is not actively attending to internal stimuli and there is no evidence of delusions, paranoia,  Associative looseness, or tangentiality.  Patient is able to complete concentration  tasks including digit span, spelling world backwards and three item recall test.  Patient does not endorse any prior history of psychosis and states "i think my mind got confused with all the anesthesia"  Patient denies suicidal ideation, and does not endorse any s/s of depression.  Affect appears euthymic and is in congruence with stated mood.  Patient thought process are logical and goal directed.  Patient verbalizes active support from family and appears to be stabilizing and improving in terms of her cognitive status.    HPI Elements:   Location:  generalized . Quality:  acute. Severity:  mild. Timing:  clearing delirium . Duration:  status post hip repair/ anesthesia. Context:  most likely due to adminnistration of postoperative analgesia in combination with ambien and phenergan .  Past Psychiatric History: Past Medical History  Diagnosis Date  . Hypertension   . Hyperlipidemia   . GERD (gastroesophageal reflux disease)   . GAD (generalized anxiety disorder)   . GIB (gastrointestinal bleeding)   . CKD (chronic kidney disease), stage III   . Peripheral edema   . Anemia   . Colon polyps   . Coronary artery disease   . Hypothyroidism   . Type II diabetes mellitus   . History of blood transfusion 10/11/2012    "2 units yesterday; first time ever" (10/12/2012)  . Gout   . Anxiety   .  Arthritis     knees and hand/fingers    reports that she has never smoked. She has never used smokeless tobacco. She reports that she does not drink alcohol or use illicit drugs. Family History  Problem Relation Age of Onset  . Colon cancer Father   . Prostate cancer Father   . Diabetes Father   . Coronary artery disease Mother 21  . Heart disease Mother   . Diabetes Mother   . Coronary artery disease Sister 60  . Diabetes Sister   . Heart disease Sister   . Diabetes Maternal Grandmother   . Diabetes Sister   . Diabetes Sister   . Diabetes Sister   . Diabetes Sister   . Diabetes Sister       Living Arrangements: Spouse/significant other   Allergies:   Allergies  Allergen Reactions  . Aspirin     vomiting  . Ciprofloxacin Nausea And Vomiting    Upset Stomach  . Codeine     Makes me sick   . Micardis [Telmisartan]     unknown  . Rofecoxib     unknown    ACT Assessment Complete:  Yes:    Educational Status    Risk to Self: Risk to self with the past 6 months Is patient at risk for suicide?: No  Risk to Others:    Abuse: Abuse/Neglect Assessment (Assessment to be complete while patient is alone) Physical Abuse: Denies Verbal Abuse: Denies Sexual Abuse: Denies Exploitation of patient/patient's resources: Denies Self-Neglect: Denies  Prior Inpatient Therapy:    Prior Outpatient Therapy:    Additional Information:                    Objective: Blood pressure 136/63, pulse 112, temperature 98.3 F (36.8 C), temperature source Oral, resp. rate 18, height '5\' 2"'  (1.575 m), weight 87.181 kg (192 lb 3.2 oz), SpO2 97 %.Body mass index is 35.14 kg/(m^2). Results for orders placed or performed during the hospital encounter of 05/17/14 (from the past 72 hour(s))  Glucose, capillary     Status: Abnormal   Collection Time: 05/19/14  9:06 PM  Result Value Ref Range   Glucose-Capillary 209 (H) 70 - 99 mg/dL   Comment 1 Notify RN   Glucose, capillary     Status: Abnormal   Collection Time: 05/20/14  7:25 AM  Result Value Ref Range   Glucose-Capillary 166 (H) 70 - 99 mg/dL   Comment 1 Notify RN   CBC with Differential     Status: Abnormal   Collection Time: 05/20/14  7:50 AM  Result Value Ref Range   WBC 14.4 (H) 4.0 - 10.5 K/uL   RBC 2.51 (L) 3.87 - 5.11 MIL/uL   Hemoglobin 7.6 (L) 12.0 - 15.0 g/dL   HCT 23.0 (L) 36.0 - 46.0 %   MCV 91.6 78.0 - 100.0 fL   MCH 30.3 26.0 - 34.0 pg   MCHC 33.0 30.0 - 36.0 g/dL   RDW 13.9 11.5 - 15.5 %   Platelets 154 150 - 400 K/uL   Neutrophils Relative % 76 43 - 77 %   Neutro Abs 11.0 (H) 1.7 - 7.7 K/uL   Lymphocytes  Relative 15 12 - 46 %   Lymphs Abs 2.2 0.7 - 4.0 K/uL   Monocytes Relative 8 3 - 12 %   Monocytes Absolute 1.2 (H) 0.1 - 1.0 K/uL   Eosinophils Relative 1 0 - 5 %   Eosinophils Absolute 0.1 0.0 - 0.7 K/uL  Basophils Relative 0 0 - 1 %   Basophils Absolute 0.0 0.0 - 0.1 K/uL  Comprehensive metabolic panel     Status: Abnormal   Collection Time: 05/20/14  7:50 AM  Result Value Ref Range   Sodium 138 135 - 145 mmol/L    Comment: Please note change in reference range.   Potassium 3.8 3.5 - 5.1 mmol/L    Comment: Please note change in reference range.   Chloride 112 96 - 112 mEq/L   CO2 19 19 - 32 mmol/L   Glucose, Bld 167 (H) 70 - 99 mg/dL   BUN 27 (H) 6 - 23 mg/dL   Creatinine, Ser 1.11 (H) 0.50 - 1.10 mg/dL   Calcium 8.0 (L) 8.4 - 10.5 mg/dL   Total Protein 5.7 (L) 6.0 - 8.3 g/dL   Albumin 2.9 (L) 3.5 - 5.2 g/dL   AST 36 0 - 37 U/L   ALT 12 0 - 35 U/L   Alkaline Phosphatase 65 39 - 117 U/L   Total Bilirubin 0.6 0.3 - 1.2 mg/dL   GFR calc non Af Amer 50 (L) >90 mL/min   GFR calc Af Amer 58 (L) >90 mL/min    Comment: (NOTE) The eGFR has been calculated using the CKD EPI equation. This calculation has not been validated in all clinical situations. eGFR's persistently <90 mL/min signify possible Chronic Kidney Disease.    Anion gap 7 5 - 15  Prepare RBC     Status: None   Collection Time: 05/20/14  9:00 AM  Result Value Ref Range   Order Confirmation ORDER PROCESSED BY BLOOD BANK   Glucose, capillary     Status: Abnormal   Collection Time: 05/20/14 11:58 AM  Result Value Ref Range   Glucose-Capillary 210 (H) 70 - 99 mg/dL   Comment 1 Notify RN   Glucose, capillary     Status: Abnormal   Collection Time: 05/20/14  4:25 PM  Result Value Ref Range   Glucose-Capillary 157 (H) 70 - 99 mg/dL   Comment 1 Notify RN   Glucose, capillary     Status: Abnormal   Collection Time: 05/20/14  8:18 PM  Result Value Ref Range   Glucose-Capillary 146 (H) 70 - 99 mg/dL   Comment 1 Notify  RN   Basic metabolic panel     Status: Abnormal   Collection Time: 05/21/14  6:11 AM  Result Value Ref Range   Sodium 140 135 - 145 mmol/L    Comment: Please note change in reference range.   Potassium 3.8 3.5 - 5.1 mmol/L    Comment: Please note change in reference range.   Chloride 106 96 - 112 mEq/L   CO2 26 19 - 32 mmol/L   Glucose, Bld 128 (H) 70 - 99 mg/dL   BUN 29 (H) 6 - 23 mg/dL   Creatinine, Ser 1.43 (H) 0.50 - 1.10 mg/dL   Calcium 8.4 8.4 - 10.5 mg/dL   GFR calc non Af Amer 37 (L) >90 mL/min   GFR calc Af Amer 43 (L) >90 mL/min    Comment: (NOTE) The eGFR has been calculated using the CKD EPI equation. This calculation has not been validated in all clinical situations. eGFR's persistently <90 mL/min signify possible Chronic Kidney Disease.    Anion gap 8 5 - 15  CBC with Differential     Status: Abnormal   Collection Time: 05/21/14  6:11 AM  Result Value Ref Range   WBC 12.0 (H) 4.0 - 10.5 K/uL  RBC 3.75 (L) 3.87 - 5.11 MIL/uL   Hemoglobin 10.7 (L) 12.0 - 15.0 g/dL    Comment: DELTA CHECK NOTED   HCT 32.2 (L) 36.0 - 46.0 %   MCV 85.9 78.0 - 100.0 fL   MCH 28.5 26.0 - 34.0 pg   MCHC 33.2 30.0 - 36.0 g/dL   RDW 18.5 (H) 11.5 - 15.5 %   Platelets 156 150 - 400 K/uL   Neutrophils Relative % 77 43 - 77 %   Neutro Abs 9.3 (H) 1.7 - 7.7 K/uL   Lymphocytes Relative 14 12 - 46 %   Lymphs Abs 1.7 0.7 - 4.0 K/uL   Monocytes Relative 8 3 - 12 %   Monocytes Absolute 0.9 0.1 - 1.0 K/uL   Eosinophils Relative 1 0 - 5 %   Eosinophils Absolute 0.1 0.0 - 0.7 K/uL   Basophils Relative 0 0 - 1 %   Basophils Absolute 0.0 0.0 - 0.1 K/uL  Hepatic function panel     Status: Abnormal   Collection Time: 05/21/14  6:11 AM  Result Value Ref Range   Total Protein 6.2 6.0 - 8.3 g/dL   Albumin 2.9 (L) 3.5 - 5.2 g/dL   AST 49 (H) 0 - 37 U/L   ALT 15 0 - 35 U/L   Alkaline Phosphatase 65 39 - 117 U/L   Total Bilirubin 1.5 (H) 0.3 - 1.2 mg/dL   Bilirubin, Direct 0.3 0.0 - 0.3 mg/dL    Indirect Bilirubin 1.2 (H) 0.3 - 0.9 mg/dL  Glucose, capillary     Status: Abnormal   Collection Time: 05/21/14  7:21 AM  Result Value Ref Range   Glucose-Capillary 136 (H) 70 - 99 mg/dL  Urinalysis, Routine w reflex microscopic     Status: Abnormal   Collection Time: 05/21/14  9:53 AM  Result Value Ref Range   Color, Urine YELLOW YELLOW   APPearance CLEAR CLEAR   Specific Gravity, Urine 1.025 1.005 - 1.030   pH 5.5 5.0 - 8.0   Glucose, UA NEGATIVE NEGATIVE mg/dL   Hgb urine dipstick TRACE (A) NEGATIVE   Bilirubin Urine NEGATIVE NEGATIVE   Ketones, ur NEGATIVE NEGATIVE mg/dL   Protein, ur NEGATIVE NEGATIVE mg/dL   Urobilinogen, UA 0.2 0.0 - 1.0 mg/dL   Nitrite NEGATIVE NEGATIVE   Leukocytes, UA MODERATE (A) NEGATIVE  Urine microscopic-add on     Status: Abnormal   Collection Time: 05/21/14  9:53 AM  Result Value Ref Range   WBC, UA 11-20 <3 WBC/hpf   RBC / HPF 3-6 <3 RBC/hpf   Bacteria, UA MANY (A) RARE  Ammonia     Status: Abnormal   Collection Time: 05/21/14 10:37 AM  Result Value Ref Range   Ammonia <9 (L) 11 - 32 umol/L    Comment: Please note change in reference range.  Glucose, capillary     Status: Abnormal   Collection Time: 05/21/14 11:08 AM  Result Value Ref Range   Glucose-Capillary 187 (H) 70 - 99 mg/dL  Blood gas, arterial     Status: Abnormal   Collection Time: 05/21/14  4:05 PM  Result Value Ref Range   FIO2 0.21 %   pH, Arterial 7.458 (H) 7.350 - 7.450   pCO2 arterial 33.8 (L) 35.0 - 45.0 mmHg   pO2, Arterial 67.8 (L) 80.0 - 100.0 mmHg   Bicarbonate 23.6 20.0 - 24.0 mEq/L   TCO2 21.7 0 - 100 mmol/L   Acid-Base Excess 0.1 0.0 - 2.0 mmol/L   O2  Saturation 94.7 %   Patient temperature 37.0    Collection site RIGHT RADIAL    Drawn by [phone]    Sample type ARTERIAL DRAW    Allens test (pass/fail) PASS PASS  Glucose, capillary     Status: Abnormal   Collection Time: 05/21/14  5:23 PM  Result Value Ref Range   Glucose-Capillary 168 (H) 70 - 99 mg/dL    Comment 1 Notify RN    Comment 2 Documented in Chart   Glucose, capillary     Status: Abnormal   Collection Time: 05/21/14  8:07 PM  Result Value Ref Range   Glucose-Capillary 213 (H) 70 - 99 mg/dL   Comment 1 Notify RN   CBC     Status: Abnormal   Collection Time: 05/22/14  6:20 AM  Result Value Ref Range   WBC 9.4 4.0 - 10.5 K/uL   RBC 3.63 (L) 3.87 - 5.11 MIL/uL   Hemoglobin 10.3 (L) 12.0 - 15.0 g/dL   HCT 31.7 (L) 36.0 - 46.0 %   MCV 87.3 78.0 - 100.0 fL   MCH 28.4 26.0 - 34.0 pg   MCHC 32.5 30.0 - 36.0 g/dL   RDW 18.2 (H) 11.5 - 15.5 %   Platelets 156 150 - 400 K/uL  Basic metabolic panel     Status: Abnormal   Collection Time: 05/22/14  6:20 AM  Result Value Ref Range   Sodium 140 135 - 145 mmol/L    Comment: Please note change in reference range.   Potassium 3.8 3.5 - 5.1 mmol/L    Comment: Please note change in reference range.   Chloride 105 96 - 112 mEq/L   CO2 27 19 - 32 mmol/L   Glucose, Bld 83 70 - 99 mg/dL   BUN 34 (H) 6 - 23 mg/dL   Creatinine, Ser 1.45 (H) 0.50 - 1.10 mg/dL   Calcium 8.1 (L) 8.4 - 10.5 mg/dL   GFR calc non Af Amer 36 (L) >90 mL/min   GFR calc Af Amer 42 (L) >90 mL/min    Comment: (NOTE) The eGFR has been calculated using the CKD EPI equation. This calculation has not been validated in all clinical situations. eGFR's persistently <90 mL/min signify possible Chronic Kidney Disease.    Anion gap 8 5 - 15  Procalcitonin - Baseline     Status: None   Collection Time: 05/22/14  7:36 AM  Result Value Ref Range   Procalcitonin 0.18 ng/mL    Comment:        Interpretation: PCT (Procalcitonin) <= 0.5 ng/mL: Systemic infection (sepsis) is not likely. Local bacterial infection is possible. (NOTE)         ICU PCT Algorithm               Non ICU PCT Algorithm    ----------------------------     ------------------------------         PCT < 0.25 ng/mL                 PCT < 0.1 ng/mL     Stopping of antibiotics            Stopping of antibiotics        strongly encouraged.               strongly encouraged.    ----------------------------     ------------------------------       PCT level decrease by  PCT < 0.25 ng/mL       >= 80% from peak PCT       OR PCT 0.25 - 0.5 ng/mL          Stopping of antibiotics                                             encouraged.     Stopping of antibiotics           encouraged.    ----------------------------     ------------------------------       PCT level decrease by              PCT >= 0.25 ng/mL       < 80% from peak PCT        AND PCT >= 0.5 ng/mL            Continuin g antibiotics                                              encouraged.       Continuing antibiotics            encouraged.    ----------------------------     ------------------------------     PCT level increase compared          PCT > 0.5 ng/mL         with peak PCT AND          PCT >= 0.5 ng/mL             Escalation of antibiotics                                          strongly encouraged.      Escalation of antibiotics        strongly encouraged.   Lactic acid, plasma     Status: None   Collection Time: 05/22/14  7:37 AM  Result Value Ref Range   Lactic Acid, Venous 1.0 0.5 - 2.2 mmol/L  Glucose, capillary     Status: None   Collection Time: 05/22/14  7:37 AM  Result Value Ref Range   Glucose-Capillary 94 70 - 99 mg/dL  Glucose, capillary     Status: Abnormal   Collection Time: 05/22/14 11:14 AM  Result Value Ref Range   Glucose-Capillary 156 (H) 70 - 99 mg/dL   Labs are reviewed and are pertinent for medical issues being treated   Current Facility-Administered Medications  Medication Dose Route Frequency Provider Last Rate Last Dose  . albuterol (PROVENTIL) (2.5 MG/3ML) 0.083% nebulizer solution 2.5 mg  2.5 mg Nebulization Q6H PRN Nita Sells, MD      . alum & mag hydroxide-simeth (MAALOX/MYLANTA) 200-200-20 MG/5ML suspension 30 mL  30 mL Oral Q6H PRN Tanna Savoy Stinson, DO      . [START  ON 06/03/2014] Darbepoetin Alfa (ARANESP) injection 100 mcg  100 mcg Subcutaneous Q5 weeks Nita Sells, MD      . enoxaparin (LOVENOX) injection 40 mg  40 mg Subcutaneous Q24H Nita Sells, MD   40 mg at 05/22/14 0830  . hydrALAZINE (APRESOLINE) tablet 50 mg  50 mg  Oral 3 times per day Nita Sells, MD   50 mg at 05/22/14 1535  . insulin aspart (novoLOG) injection 0-5 Units  0-5 Units Subcutaneous QHS Truett Mainland, DO   2 Units at 05/21/14 2143  . insulin aspart (novoLOG) injection 0-9 Units  0-9 Units Subcutaneous TID WC Truett Mainland, DO   2 Units at 05/22/14 1315  . insulin detemir (LEVEMIR) injection 10 Units  10 Units Subcutaneous QHS Nita Sells, MD   10 Units at 05/21/14 2142  . levofloxacin (LEVAQUIN) tablet 750 mg  750 mg Oral Q48H Nita Sells, MD   750 mg at 05/22/14 1335  . levothyroxine (SYNTHROID, LEVOTHROID) tablet 75 mcg  75 mcg Oral QAC breakfast Nita Sells, MD   75 mcg at 05/22/14 0829  . losartan (COZAAR) tablet 25 mg  25 mg Oral Daily Nita Sells, MD   25 mg at 05/22/14 0829  . menthol-cetylpyridinium (CEPACOL) lozenge 3 mg  1 lozenge Oral PRN Carole Civil, MD       Or  . phenol (CHLORASEPTIC) mouth spray 1 spray  1 spray Mouth/Throat PRN Carole Civil, MD      . pantoprazole (PROTONIX) EC tablet 40 mg  40 mg Oral BID Tanna Savoy Stinson, DO   40 mg at 05/22/14 7672  . polyethylene glycol (MIRALAX / GLYCOLAX) packet 17 g  17 g Oral Daily Nita Sells, MD   17 g at 05/22/14 0830    Psychiatric Specialty Exam:     Blood pressure 136/63, pulse 112, temperature 98.3 F (36.8 C), temperature source Oral, resp. rate 18, height '5\' 2"'  (1.575 m), weight 87.181 kg (192 lb 3.2 oz), SpO2 97 %.Body mass index is 35.14 kg/(m^2).  General Appearance: Casual  Eye Contact::  Good  Speech:  Clear and Coherent and Normal Rate rhythm and prosody  Volume:  Normal  Mood:  Euthymic  Affect:  Congruent  Thought Process:   Coherent, Goal Directed, Linear and Logical  Orientation:  Full (Time, Place, and Person)  Thought Content:  WDL  No evidence of thought distortions  Suicidal Thoughts:  No  Homicidal Thoughts:  No  Memory:  Immediate;   Fair Recent;   Fair Remote;   Good  Judgement:  Good  Insight:  Good  Psychomotor Activity:  Normal  Concentration:  Good  Recall:  Good  Akathisia:  No  Handed:  Right  AIMS (if indicated):     Assets:  Agricultural consultant Housing Social Support  Sleep:      Treatment Plan Summary: patient's alterations in thought appear to be stabilizing  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  At this point patient appears psychiatric stable.  Please feel free to contact should mental status begin to deteriorate. Thank you for the opportunity to consult with this patient.   Disposition:  no follow up needed unless mental status begins to deteriorate.   Kennedy Bucker  PMH-NP  05/22/2014 4:54 PM I have been consulted about this patient and agree with the assessment and plan Geralyn Flash A. [email or web address].

## 2014-05-22 NOTE — Progress Notes (Signed)
Pt to be reassessed by psychiatry.  Charlene Brooke, MSW  Social Worker [phone]

## 2014-05-22 NOTE — Care Management Utilization Note (Signed)
UR complete 

## 2014-05-22 NOTE — Evaluation (Signed)
Clinical/Bedside Swallow Evaluation Patient Details  Name: [name] MRN: [id number] DOB: [birth year]  Today's Date: 05/22/2014 Time: 3:38 PM  - 4:06 PM    Past Medical History:  Past Medical History  Diagnosis Date  . Hypertension   . Hyperlipidemia   . GERD (gastroesophageal reflux disease)   . GAD (generalized anxiety disorder)   . GIB (gastrointestinal bleeding)   . CKD (chronic kidney disease), stage III   . Peripheral edema   . Anemia   . Colon polyps   . Coronary artery disease   . Hypothyroidism   . Type II diabetes mellitus   . History of blood transfusion 10/11/2012    "2 units yesterday; first time ever" (10/12/2012)  . Gout   . Anxiety   . Arthritis     knees and hand/fingers   Past Surgical History:  Past Surgical History  Procedure Laterality Date  . Incision and drainage perirectal abscess  ~ 2007  . Retinopathy surgery Bilateral 1980's?  . Orif femur fracture Left 10/09/2012    Procedure: OPEN REDUCTION INTERNAL FIXATION (ORIF) DISTAL FEMUR FRACTURE;  Surgeon: Rozanna Box, MD;  Location: [facility];  Service: Orthopedics;  Laterality: Left;  . Fracture surgery    . Compression hip screw Right 05/18/2014    Procedure: COMPRESSION HIP;  Surgeon: Carole Civil, MD;  Location: [facility];  Service: Orthopedics;  Laterality: Right;   HPI:  Mrs. [name] is a 89 woman who was admitted after a fall on Christmas Day. She underwent right hip surgery on 05/18/2014. She became nauseous day of surgery and after surgery and was not able to keep down anything orally. She was found to have some blood tinged emesis and kept nothing by mouth except for sips with ice chips. She was found to have a HB of ~ 7.6 on 12/28 and transfused 2 U PRBC. Subsequently she then started having hallucinations overnight. Ammonia level was less than 9 and it was noted that she was seeing small children and things that are not there.  Recent results of CT chest: "Small area of  infiltrate right upper lobe posterior segment. Most of the thoracic esophagus is somewhat dilated and fluid-filled. This finding may be due to severe esophageal dysmotility or possibly severe reflux. This finding may warrant direct visualization with manometry to further assess. Barium swallow potentially could be helpful to assess for underlying motility disorder as well." SLP asked to evaluate swallow at bedside.   Assessment/Recommendations/Treatment Plan    SLP Assessment Clinical Impression Statement: Mrs. [name] was alert and oriented during my evaluation. She denies difficulties swallowing, heartburn, or early satiety. Oral motor examination is unremarkable/WNL and pt shows no over signs or symptoms of aspiration with consistencies presented. SLP educated pt in regards to risks for aspiration following hip fracture and being more bedbound. She was encouraged to raise [name] as high as tolerated for all eating and drinking and remain upright 30 minutes after. Pt extremely pleasant and cooperative. No further SLP services indicated at this time. Risk for aspiration appears low. Risk for Aspiration: Mild Other Related Risk Factors:  (consuming meals in bed)  Swallow Evaluation Recommendations Diet Recommendations: Regular, Thin liquid Liquid Administration via: Cup, Straw Medication Administration: Whole meds with liquid Supervision: Patient able to self feed Postural Changes and/or Swallow Maneuvers: Seated upright 90 degrees, Upright 30-60 min after meal Oral Care Recommendations: Oral care BID Other Recommendations: Clarify dietary restrictions Follow up Recommendations: None  Treatment Plan Treatment Plan Recommendations: No treatment  recommended at this time  Prognosis Prognosis for Safe Diet Advancement: Good  Individuals Consulted Consulted and Agree with Results and Recommendations: Patient, MD  Swallowing Goals   N/A  Swallow Study Prior Functional Status    Independent  General  Date of Onset: 05/17/14 HPI: Mrs. [name] is a 87 woman who was admitted after a fall on Christmas Day. She underwent right hip surgery on 05/18/2014. She became nauseous day of surgery and after surgery and was not able to keep down anything orally. She was found to have some blood tinged emesis and kept nothing by mouth except for sips with ice chips. She was found to have a HB of ~ 7.6 on 12/28 and transfused 2 U PRBC. Subsequently she then started having hallucinations overnight. Ammonia level was less than 9 and it was noted that she was seeing small children and things that are not there.  Recent results of CT chest: "Small area of infiltrate right upper lobe posterior segment. Most of the thoracic esophagus is somewhat dilated and fluid-filled. This finding may be due to severe esophageal dysmotility or possibly severe reflux. This finding may warrant direct visualization with manometry to further assess. Barium swallow potentially could be helpful to assess for underlying motility disorder as well." SLP asked to evaluate swallow at bedside. Type of Study: Bedside swallow evaluation Previous Swallow Assessment: None on record Diet Prior to this Study: Regular, Thin liquids Temperature Spikes Noted: No Respiratory Status: Room air History of Recent Intubation: No Behavior/Cognition: Alert, Cooperative, Pleasant mood Oral Cavity - Dentition: Adequate natural dentition Self-Feeding Abilities: Able to feed self Patient Positioning: Upright in bed Baseline Vocal Quality: Clear Volitional Cough: Strong Volitional Swallow: Able to elicit  Oral Motor/Sensory Function  Overall Oral Motor/Sensory Function: Appears within functional limits for tasks assessed  Consistency Results  Ice Chips Ice chips: Not tested  Thin Liquid Thin Liquid: Within functional limits Presentation: Self Fed;Cup;Straw  Nectar Thick Liquid Nectar Thick Liquid: Not tested  Honey Thick  Liquid Honey Thick Liquid: Not tested  Puree Puree: Within functional limits Presentation: Spoon  Solid Solid: Within functional limits Presentation: Self Fed  Thank you,  [name], [place]  [place] 05/22/2014,4:13 PM

## 2014-05-22 NOTE — Progress Notes (Signed)
[facility] PROGRESS NOTE  [name] [id number] DOB: 21-Jul-1945 DOA: 05/17/2014 PCP: Redge Gainer, MD  68 y/o h/o L hip # 10/12/12 s/p repair, DM ty 2, Htn, Hld, CKD II-III, Prior UGIB from [facility] [? MW tear 2003; 9/20009], Diabetic Gastroparesis, 4 cm Adrenal adenoma, chronic gluteal ulcer from [facility] for peri-rectal abcess 2006, Bipolar, chronic L3 compression # admitted 05/17/14 with R hip #.  She became nauseous day of surgery and after surgery and was not able to keep down anything orally. She was found to have some blood tinged emesis and kept nothing by mouth except for sips with ice chips. SHe was found to have a HB of ~ 7.6 on 12/28 and transfused 2 U PRBC.  Subsequently she then started having hallucinations overnight. Ammonia level was less than 9 and it was noted that she was seeing small children and things that are not there. Note that patient had received overnight with transfusion Benadryl , Phenergan  Ambien as well as IV morphine. Urine analysis was performed, chest x-ray was performed. It was noted SHe had + Bacteriuria, she also was found to have on ABG PaO2 of 67 with hyperventilation pH 7.4 Because of this a lactic acid was done which was 1.0 and Pro calcitonin was within normal limits CT scan chest was performed to rule out PE which showed possible right upper lobe infiltrate is serially, dilated esophagus and incomplete left adrenal mass with benign features-which warrants outpatient classification and characterization    Assessment/Plan: 1. Toxic metabolic secondary to pyelonephritis/aspiration pneumonia?. Compounded by multiple sedating agents as well as opiates. Monitor.  2. Right upper lobe pneumonia on CT scan-potentially secondary to aspiration. Speech therapy eval ordered 12/30. Will need broadening of antibiotics from ceftriaxone to Levaquin which will cover this to some extent. 3. Pyelonephritis-await urine culture. Levaquin by mouth for  now 4. Nausea/vomiting-potential Mallory-Weiss tear-her hematocrit dropped to 7.6  12/28 she was transfused 2 units packed red blood cells. Patient tolerated a full diet 12/29 without event. No further bleeding, hemoglobin trend in the 10 range so patient is stable 5. Acute blood loss anemia multifactorial-status post hip surgery/status secondary to #1-transfused 2 units 6. Right Hip fracture status post repair 12/26-will need skilled nursing care as per physical occupational therapy 7. Hypertension-resume by mouth medications, losartan 25 mg-I have discontinued clonidine 0.2 mg twice a day as well as metoprolol 25 XL as there is some correlation with these medications and delirium. Monitor trends.  Hydralzine PO started 50 mg tid 8. Diabetes. Held Amaryl given vomiting, continue sliding scale insulin. Continue to monitor-blood sugars have been 95-156 today today so we will continue Levemir 10 units 9. Hypothyroidism. Patient was on Synthroid IV 25 g which is a lower dose than her 75 mg orally. I restarted her oral dosing. I do not think at this stage and TSH would be of any benefit as her delirium is likely secondary to medications. If this persists I will pursue a TSH/T4 10. GERD. Continue Protonix has IV twice a day  Code Status: full code Family Communication: discussed with family at the bedside Disposition Plan: pending resolution of encephalopathy SNF potentially one to 2 days    Consultants:  orthopedics  Procedures:  Open treatment, internal fixation of right hip  Antibiotics:  Ceftriaxone 12/30-12/30  Levaquin 12/30  HPI/Subjective:  Less confused seems better right now Still a little tangential and not sure what's going on. Mentation seems to be clearer than prior exams. Denies nausea vomiting tolerating diet  well and wants to go to nursing facility.  Objective: Filed Vitals:   05/22/14 0836  BP:   Pulse: 82  Temp:   Resp:     Intake/Output Summary (Last 24  hours) at 05/22/14 1215 Last data filed at 05/22/14 [id number]  Gross per 24 hour  Intake    720 ml  Output    850 ml  Net   -130 ml   Filed Weights   05/17/14 1534 05/17/14 2006  Weight: 74.844 kg (165 lb) 87.181 kg (192 lb 3.2 oz)    Exam:   General:  NAD  Cardiovascular: S1, S2 RRR  Respiratory: CTAB, clinically clear  Abdomen: Soft, nt,nd, bs+ no abdominal discomfort  Musculoskeletal: no edema b/l   Grossly intact still slightly confused and tangential but not agitated or disoriented. Can answer some questions to place and time still a little bit disoriented immediately to  Data Reviewed: Basic Metabolic Panel:  Recent Labs Lab 05/18/14 0538 05/19/14 0623 05/20/14 0750 05/21/14 0611 05/22/14 0620  NA 139 138 138 140 140  K 4.7 4.4 3.8 3.8 3.8  CL 108 108 112 106 105  CO2 25 20 19 26 27   GLUCOSE 234* 374* 167* 128* 83  BUN 38* 35* 27* 29* 34*  CREATININE 1.61* 1.47* 1.11* 1.43* 1.45*  CALCIUM 8.7 8.4 8.0* 8.4 8.1*   Liver Function Tests:  Recent Labs Lab 05/17/14 1658 05/17/14 2125 05/20/14 0750 05/21/14 0611  AST 20 19 36 49*  ALT 11 11 12 15   ALKPHOS 115 101 65 65  BILITOT 0.6 0.6 0.6 1.5*  PROT 7.1 6.8 5.7* 6.2  ALBUMIN 3.7 3.5 2.9* 2.9*   No results for input(s): LIPASE, AMYLASE in the last 168 hours.  Recent Labs Lab 05/21/14 1037  AMMONIA <9*   CBC:  Recent Labs Lab 05/17/14 1658 05/17/14 2125  05/19/14 [id number] 05/19/14 1641 05/20/14 0750 05/21/14 0611 05/22/14 0620  WBC 9.9 11.1*  < > 14.7* 14.1* 14.4* 12.0* 9.4  NEUTROABS 7.9* 9.6*  --   --   --  11.0* 9.3*  --   HGB 12.1 11.4*  < > 8.9* 8.4* 7.6* 10.7* 10.3*  HCT 38.1 35.2*  < > 26.8* 25.2* 23.0* 32.2* 31.7*  MCV 93.6 92.9  < > 93.1 93.0 91.6 85.9 87.3  PLT 163 172  < > 159 155 154 156 156  < > = values in this interval not displayed. Cardiac Enzymes: No results for input(s): CKTOTAL, CKMB, CKMBINDEX, TROPONINI in the last 168 hours. BNP (last 3 results) No results for  input(s): PROBNP in the last 8760 hours. CBG:  Recent Labs Lab 05/21/14 1108 05/21/14 1723 05/21/14 2007 05/22/14 0737 05/22/14 1114  GLUCAP 187* 168* 213* 94 156*    No results found for this or any previous visit (from the past 240 hour(s)).   Studies: Ct Head Wo Contrast  05/21/2014   CLINICAL DATA:  68 year old female with acute hallucinations and altered mental status.  EXAM: CT HEAD WITHOUT CONTRAST  TECHNIQUE: Contiguous axial images were obtained from the base of the skull through the vertex without intravenous contrast.  COMPARISON:  01/23/2008  FINDINGS: Mild chronic small-vessel white matter ischemic changes are again noted.  No acute intracranial abnormalities are identified, including mass lesion or mass effect, hydrocephalus, extra-axial fluid collection, midline shift, hemorrhage, or acute infarction.  The visualized bony calvarium is unremarkable.  IMPRESSION: No evidence of acute intracranial abnormality.  Mild chronic small-vessel white matter ischemic changes.   Electronically Signed  By: Hassan Rowan M.D.   On: 05/21/2014 16:43   Ct Angio Chest Pe W/cm &/or Wo Cm  05/22/2014   CLINICAL DATA:  Hypoxia.  Recent hip surgery  EXAM: CT ANGIOGRAPHY CHEST WITH CONTRAST  TECHNIQUE: Multidetector CT imaging of the chest was performed using the standard protocol during bolus administration of intravenous contrast. Multiplanar CT image reconstructions and MIPs were obtained to evaluate the vascular anatomy.  CONTRAST:  36mL OMNIPAQUE IOHEXOL 350 MG/ML SOLN  COMPARISON:  Chest radiograph May 21, 2014  FINDINGS: There is no demonstrable pulmonary embolus. There is no thoracic aortic aneurysm or dissection.  There is diffuse fluid throughout the esophagus to the level of the upper thoracic esophagus. Esophagus is dilated from the upper thoracic level to the gastroesophageal junction.  There is a small area of infiltrate in the posterior segment of the right upper lobe. There is rather  minimal bibasilar atelectatic change.  There are multiple foci of coronary artery calcification. There is mild left ventricular hypertrophy. There is no appreciable thoracic adenopathy. The pericardium is not thickened.  In the visualized upper abdomen, there is hepatic steatosis. There is incomplete visualization of a left adrenal mass measuring 4.7 x 4.3 cm. This mass has mixed attenuation and may contain fat.  Visualized thyroid appears normal. There is degenerative change in the thoracic spine with diffuse idiopathic skeletal hyperostosis. There are no blastic or lytic bone lesions.  Review of the MIP images confirms the above findings.  IMPRESSION: No demonstrable pulmonary embolus.  Small area of infiltrate right upper lobe posterior segment.  Most of the thoracic esophagus is somewhat dilated and fluid-filled. This finding may be due to severe esophageal dysmotility or possibly severe reflux. This finding may warrant direct visualization with manometry to further assess. Barium swallow potentially could be helpful to assess for underlying motility disorder as well.  Incomplete visualization of sizable left adrenal mass. The visualized portions of this mass suggest that this lesion has benign features. As is incompletely visualized, it may be prudent to consider adrenal MR pre and post-contrast to further assess. If there is a contraindication to MR, pre and post intravenous contrast CT abdomen to completely evaluate this lesion would be a reasonable alternative examination.  Hepatic steatosis.   Electronically Signed   By: Lowella Grip M.D.   On: 05/22/2014 10:59   Dg Chest Port 1 View  05/21/2014   CLINICAL DATA:  Pneumonia.  EXAM: PORTABLE CHEST - 1 VIEW  COMPARISON:  May 17, 2014.  FINDINGS: The heart size and mediastinal contours are within normal limits. Both lungs are clear. No pneumothorax or pleural effusion is noted. The visualized skeletal structures are unremarkable.  IMPRESSION: No  acute cardiopulmonary abnormality seen.   Electronically Signed   By: Sabino Dick M.D.   On: 05/21/2014 14:34    Scheduled Meds: . [START ON 06/03/2014] Darbepoetin Alfa  100 mcg Subcutaneous Q5 weeks  . enoxaparin (LOVENOX) injection  40 mg Subcutaneous Q24H  . insulin aspart  0-5 Units Subcutaneous QHS  . insulin aspart  0-9 Units Subcutaneous TID WC  . insulin detemir  10 Units Subcutaneous QHS  . levothyroxine  75 mcg Oral QAC breakfast  . losartan  25 mg Oral Daily  . pantoprazole  40 mg Oral BID  . polyethylene glycol  17 g Oral Daily   Continuous Infusions:    Principal Problem:   Closed right hip fracture Active Problems:   Essential hypertension   Diabetes   Hip fracture requiring  operative repair   Hypothyroidism   Intertrochanteric fracture of right femur   Fall    Time spent: 33mins   Verneita Griffes, MD [facility] [phone]

## 2014-05-22 NOTE — Progress Notes (Signed)
Patient ID: [name], female   DOB: Dec 14, 1945, 68 y.o.   MRN: [id number]  POD # 4  VS BP 173/62 mmHg  Pulse 82  Temp(Src) 97.9 F (36.6 C) (Oral)  Resp 98  Ht 5\' 2"  (1.575 m)  Wt 192 lb 3.2 oz (87.181 kg)  BMI 35.14 kg/m2  SpO2 98%   LABS  Hemoglobin & Hematocrit     Component Value Date/Time   HGB 10.3* 05/22/2014 0620   HCT 31.7* 05/22/2014 0620     DRESSING changed incision clean   Neuro-vasculo-motor status of operative limb normal   A/P ok for discharge  Weight bearing as tolerated  Dressing change daily as needed , remove clips pod 14 F/U 2 weeks post op  Hip precautions none  Anticoagulation 30 days post op

## 2014-05-22 NOTE — Progress Notes (Signed)
SLP Cancellation Note  Patient Details Name: [name] MRN: [id number] DOB: 04-30-1946   Cancelled treatment:       Reason Eval/Treat Not Completed: Patient at procedure or test/unavailable (telepsych appointment); SLP will return later this afternoon to complete BSE.   Thank you,  Genene Churn, [place]    [place] 05/22/2014, 2:25 PM

## 2014-05-22 NOTE — Progress Notes (Signed)
Room air saturation 98 percent. No c/o pain or discomfort. Oriented to person,place,and time.Will continue to monitor patient.

## 2014-05-22 NOTE — Progress Notes (Signed)
Five attempts have been made to work with this pt today.  She has been otherwise occupied with other activies and unavailable for PT.  At my last attempt she requested that PT be delayed until tomorrow as she had quite a bit of aching.  I did remind her that it was normal to have some aching after having a hip fracture with repair.  We will try to work with her in the AM.

## 2014-05-23 DIAGNOSIS — W19XXXD Unspecified fall, subsequent encounter: Secondary | ICD-10-CM | POA: Diagnosis not present

## 2014-05-23 DIAGNOSIS — R278 Other lack of coordination: Secondary | ICD-10-CM | POA: Diagnosis not present

## 2014-05-23 DIAGNOSIS — S31829D Unspecified open wound of left buttock, subsequent encounter: Secondary | ICD-10-CM | POA: Diagnosis not present

## 2014-05-23 DIAGNOSIS — T8189XD Other complications of procedures, not elsewhere classified, subsequent encounter: Secondary | ICD-10-CM | POA: Diagnosis not present

## 2014-05-23 DIAGNOSIS — F419 Anxiety disorder, unspecified: Secondary | ICD-10-CM | POA: Diagnosis not present

## 2014-05-23 DIAGNOSIS — Y839 Surgical procedure, unspecified as the cause of abnormal reaction of the patient, or of later complication, without mention of misadventure at the time of the procedure: Secondary | ICD-10-CM | POA: Diagnosis not present

## 2014-05-23 DIAGNOSIS — I129 Hypertensive chronic kidney disease with stage 1 through stage 4 chronic kidney disease, or unspecified chronic kidney disease: Secondary | ICD-10-CM | POA: Diagnosis not present

## 2014-05-23 DIAGNOSIS — N183 Chronic kidney disease, stage 3 (moderate): Secondary | ICD-10-CM | POA: Diagnosis not present

## 2014-05-23 DIAGNOSIS — R2689 Other abnormalities of gait and mobility: Secondary | ICD-10-CM | POA: Diagnosis not present

## 2014-05-23 DIAGNOSIS — Z96641 Presence of right artificial hip joint: Secondary | ICD-10-CM | POA: Diagnosis not present

## 2014-05-23 DIAGNOSIS — E785 Hyperlipidemia, unspecified: Secondary | ICD-10-CM | POA: Diagnosis not present

## 2014-05-23 DIAGNOSIS — R293 Abnormal posture: Secondary | ICD-10-CM | POA: Diagnosis not present

## 2014-05-23 DIAGNOSIS — I10 Essential (primary) hypertension: Secondary | ICD-10-CM | POA: Diagnosis not present

## 2014-05-23 DIAGNOSIS — J811 Chronic pulmonary edema: Secondary | ICD-10-CM | POA: Diagnosis not present

## 2014-05-23 DIAGNOSIS — M6281 Muscle weakness (generalized): Secondary | ICD-10-CM | POA: Diagnosis not present

## 2014-05-23 DIAGNOSIS — Z4789 Encounter for other orthopedic aftercare: Secondary | ICD-10-CM | POA: Diagnosis not present

## 2014-05-23 DIAGNOSIS — S72001A Fracture of unspecified part of neck of right femur, initial encounter for closed fracture: Secondary | ICD-10-CM | POA: Diagnosis not present

## 2014-05-23 DIAGNOSIS — E039 Hypothyroidism, unspecified: Secondary | ICD-10-CM | POA: Diagnosis not present

## 2014-05-23 DIAGNOSIS — M25559 Pain in unspecified hip: Secondary | ICD-10-CM | POA: Diagnosis not present

## 2014-05-23 DIAGNOSIS — E119 Type 2 diabetes mellitus without complications: Secondary | ICD-10-CM | POA: Diagnosis not present

## 2014-05-23 LAB — COMPREHENSIVE METABOLIC PANEL
ALK PHOS: 72 U/L (ref 39–117)
ALT: 25 U/L (ref 0–35)
AST: 41 U/L — ABNORMAL HIGH (ref 0–37)
Albumin: 2.3 g/dL — ABNORMAL LOW (ref 3.5–5.2)
Anion gap: 7 (ref 5–15)
BILIRUBIN TOTAL: 0.6 mg/dL (ref 0.3–1.2)
BUN: 38 mg/dL — AB (ref 6–23)
CALCIUM: 7.4 mg/dL — AB (ref 8.4–10.5)
CHLORIDE: 104 meq/L (ref 96–112)
CO2: 26 mmol/L (ref 19–32)
Creatinine, Ser: 1.71 mg/dL — ABNORMAL HIGH (ref 0.50–1.10)
GFR calc Af Amer: 34 mL/min — ABNORMAL LOW (ref >90)
GFR calc non Af Amer: 30 mL/min — ABNORMAL LOW (ref >90)
Glucose, Bld: 165 mg/dL — ABNORMAL HIGH (ref 70–99)
Potassium: 3.9 mmol/L (ref 3.5–5.1)
Sodium: 137 mmol/L (ref 135–145)
Total Protein: 5.2 g/dL — ABNORMAL LOW (ref 6.0–8.3)

## 2014-05-23 LAB — CBC WITH DIFFERENTIAL/PLATELET
BASOS ABS: 0 10*3/uL (ref 0.0–0.1)
BASOS PCT: 0 % (ref 0–1)
EOS PCT: 3 % (ref 0–5)
Eosinophils Absolute: 0.2 10*3/uL (ref 0.0–0.7)
HCT: 30.4 % — ABNORMAL LOW (ref 36.0–46.0)
HEMOGLOBIN: 9.8 g/dL — AB (ref 12.0–15.0)
Lymphocytes Relative: 20 % (ref 12–46)
Lymphs Abs: 1.6 10*3/uL (ref 0.7–4.0)
MCH: 28.7 pg (ref 26.0–34.0)
MCHC: 32.2 g/dL (ref 30.0–36.0)
MCV: 89.1 fL (ref 78.0–100.0)
MONO ABS: 0.8 10*3/uL (ref 0.1–1.0)
Monocytes Relative: 10 % (ref 3–12)
NEUTROS PCT: 67 % (ref 43–77)
Neutro Abs: 5.5 10*3/uL (ref 1.7–7.7)
Platelets: 148 10*3/uL — ABNORMAL LOW (ref 150–400)
RBC: 3.41 MIL/uL — ABNORMAL LOW (ref 3.87–5.11)
RDW: 17.8 % — AB (ref 11.5–15.5)
WBC: 8.1 10*3/uL (ref 4.0–10.5)

## 2014-05-23 LAB — GLUCOSE, CAPILLARY
GLUCOSE-CAPILLARY: 157 mg/dL — AB (ref 70–99)
Glucose-Capillary: 193 mg/dL — ABNORMAL HIGH (ref 70–99)

## 2014-05-23 LAB — PROCALCITONIN: Procalcitonin: 0.14 ng/mL

## 2014-05-23 MED ORDER — LEVOFLOXACIN 750 MG PO TABS: 750.0000 mg | ORAL_TABLET | ORAL | Status: DC

## 2014-05-23 MED ORDER — HYDRALAZINE HCL 50 MG PO TABS: 50.0000 mg | ORAL_TABLET | Freq: Three times a day (TID) | ORAL | Status: DC

## 2014-05-23 NOTE — Progress Notes (Signed)
Patient discharged to [facility] called,and given to [employer] LPN. No c/o pain or discomfort noted. Accompanied by staff to an awaiting vehicle.

## 2014-05-23 NOTE — Clinical Social Work Placement (Signed)
Clinical Social Work Department CLINICAL SOCIAL WORK PLACEMENT NOTE 05/23/2014  Patient:  [name], [name]  Account Number:  [id number] Admit date:  05/17/2014  Clinical Social Worker:  Daiva Huge  Date/time:  05/20/2014 02:06 PM  Clinical Social Work is seeking post-discharge placement for this patient at the following level of care:   [place]   (*CSW will update this form in Epic as items are completed)   05/20/2014  Patient/family provided with [place] Department of Clinical Social Work's list of facilities offering this level of care within the geographic area requested by the patient (or if unable, by the patient's family).  05/20/2014  Patient/family informed of their freedom to choose among providers that offer the needed level of care, that participate in Medicare, Medicaid or managed care program needed by the patient, have an available bed and are willing to accept the patient.  05/20/2014  Patient/family informed of [facility]' ownership interest in [facility], as well as of the fact that they are under no obligation to receive care at this facility.  PASARR submitted to EDS on 05/20/2014 PASARR number received on 05/20/2014  FL2 transmitted to all facilities in geographic area requested by pt/family on  05/20/2014 FL2 transmitted to all facilities within larger geographic area on   Patient informed that his/her managed care company has contracts with or will negotiate with  certain facilities, including the following:     Patient/family informed of bed offers received:  05/21/2014 Patient chooses bed at [facility] Physician recommends and patient chooses bed at  [facility]  Patient to be transferred to [place] on  05/23/2014 Patient to be transferred to facility by Facility arranging with [facility] Patient and family notified of transfer on 05/23/2014 Name of family member notified:  Ed-  husband  The following physician request were entered in Epic:   Additional Comments:  Benay Pike, [place]

## 2014-05-23 NOTE — Clinical Social Work Note (Signed)
Pt d/c today to [facility]. Pt sleeping at time of visit. Pt's husband, Ed and facility aware and agreeable. Ed does not feel comfortable transporting pt in car. [place] plans to arrange transport via [facility] once pt is ready. He is agreeable. CSW will fax d/c summary upon completion.  Benay Pike, [place]

## 2014-05-23 NOTE — Progress Notes (Signed)
Inpatient Diabetes Program Recommendations  AACE/ADA: New Consensus Statement on Inpatient Glycemic Control (2013)  Target Ranges:  Prepandial:   less than 140 mg/dL      Peak postprandial:   less than 180 mg/dL (1-2 hours)      Critically ill patients:  140 - 180 mg/dL   Results for CHARLA, FURY (MRN [id number]) as of 05/23/2014 10:35  Ref. Range 05/22/2014 07:37 05/22/2014 11:14 05/22/2014 16:55 05/22/2014 21:05 05/23/2014 07:23  Glucose-Capillary Latest Range: 70-99 mg/dL 94 156 (H) 258 (H) 225 (H) 157 (H)    Diabetes history: DM2 Outpatient Diabetes medications: Amaryl 4mg  QAM, Amaryl 2 mg QPM Current orders for Inpatient glycemic control: Levemir 10 units QHS, Novolog 0-9 Ac, Novolog 0-5 units HS  Inpatient Diabetes Program Recommendations Insulin - Meal Coverage: Once diet changed, if post prandial glucose continues to be consistently elevated may want to consider ordering Novolog 3 units TID with meals if patient eats at least 50% of meal. Diet: Ordered Regular diet. Please consider changing diet to Carbohydrate Modified diet.  Thanks, Barnie Alderman, RN, MSN, CCRN, CDE Diabetes Coordinator Inpatient Diabetes Program [phone] (Team Pager) [phone] ([facility] office) [phone] [facility] office)

## 2014-05-23 NOTE — Discharge Summary (Signed)
Physician Discharge Summary  SINTHIA PIOTTER [id number] DOB: 26-May-1945 DOA: 05/17/2014  PCP: Redge Gainer, MD  Admit date: 05/17/2014 Discharge date: 05/23/2014  Time spent: 35 minutes  Recommendations for Outpatient Follow-up:  1. Needs cbc + bmet in 1 week- 2. Transferring [facility] for therapy on R hip # 3. Consider Vit D supplementation  4. Will Need monitoring on her meds as patient did suffer from some delirium during hospital stay 5. Please obtain nonemergent CT scan/MRI abdomen for further characterization of adrenal masses 3-4 weeks 6. Follow up final urine culture performed 12/28-was growing >100,000 gram-negative rods and clinically has responded to Levaquin 7. Complete Levaquin as below 8. She is not a good candidate for DVT prophylaxis given her history of GI bleed and I would suggest frequent ambulation as well as maybe TED hose to prevent secondary DVT  Discharge Diagnoses:  Principal Problem:   Closed right hip fracture Active Problems:   Essential hypertension   Diabetes   Hip fracture requiring operative repair   Hypothyroidism   Intertrochanteric fracture of right femur   Fall   Confusion   Discharge Condition: Stable  Diet recommendation: Diabetic heart healthy  Filed Weights   05/17/14 1534 05/17/14 2006  Weight: 74.844 kg (165 lb) 87.181 kg (192 lb 3.2 oz)    History of present illness:  68 y/o h/o L hip # 10/12/12 s/p repair, DM ty 2, Htn, Hld, CKD II-III, Prior UGIB from [facility] [? MW tear 2003; 9/20009], Diabetic Gastroparesis, 4 cm Adrenal adenoma, chronic gluteal ulcer [facility] for peri-rectal abcess 2006, Bipolar, chronic L3 compression # admitted 05/17/14 with R hip #. She became nauseous day of surgery and after surgery and was not able to keep down anything orally. She was found to have some blood tinged emesis and kept nothing by mouth except for sips with ice chips. SHe was found to have a HB of ~ 7.6 on 12/28 and transfused 2  U PRBC. Subsequently she then started having hallucinations overnight. Ammonia level was less than 9 and it was noted that she was seeing small children and things that are not there. Note that patient had received overnight with transfusion Benadryl , Phenergan Ambien as well as IV morphine. Urine analysis was performed, chest x-ray was performed. It was noted SHe had + Bacteriuria, she also was found to have on ABG PaO2 of 67 with hyperventilation pH 7.4 Because of this a lactic acid was done which was 1.0 and Pro calcitonin was within normal limits CT scan chest was performed to rule out PE which showed possible right upper lobe infiltrate is serially, dilated esophagus and incomplete left adrenal mass with benign features-which warrants outpatient classification and characterization  Hospital Course:   1. Toxic metabolic secondary to pyelonephritis/aspiration pneumonia?. Compounded by multiple sedating agents as well as opiates. Monitor.  2. Right upper lobe pneumonia on CT scan-potentially secondary to aspiration. Speech therapy eval ordered 12/30-they have recommended a regular diet Will need broadening of antibiotics from ceftriaxone to Levaquin which will cover this to some extent-see below for discussion 3.  Acute pyelonephritis Gowing above 100,000 g per negative rods. Levaquin by mouth to continue as below 4. Nausea/vomiting-potential Mallory-Weiss tear-her hematocrit dropped to 7.6 12/28 she was transfused 2 units packed red blood cells. Patient tolerated a full diet 12/29 without event. No further bleeding, hemoglobin trend in the 10 range so patient is stable 5. Acute blood loss anemia multifactorial-status post hip surgery/status secondary to #1-transfused 2 units 6. Right  Hip fracture status post repair 12/26-will need skilled nursing care as per physical occupational therapy 7. Hypertension-resume by mouth medications, losartan 25 mg-I have discontinued clonidine 0.2 mg twice a  day as there is some correlation with these medications and delirium. Monitor trends. Hydralzine PO started 50 mg tid 8. Diabetes. Held Amaryl given vomiting during hospital stay and she was on sliding scale with sugars less than 200. She will continue her oral medications as an outpatientt think at this stage and TSH would be of any benefit as her delirium is likely secondary to medications. If this persists I will pursue a TSH/T4 9. GERD complicated by prior upper GI bleed with Mallory-Weiss tear- placed on Protonix twice a day as she was vomiting earlier in hospital stay, she currently now is able to take by mouth   Consultations:  orthopedics Telemetry psychiatry   Discharge Exam: Filed Vitals:   05/23/14 0800  BP:   Pulse:   Temp:   Resp: 20    alert oriented pleasant    General:  EOMI NCAT Cardiovascular: S1-S2 no murmur rub or gallop Respiratory -stable  Hip wound looks clean  Discharge Instructions   Discharge Instructions    Diet - low sodium heart healthy    Complete by:  As directed      Increase activity slowly    Complete by:  As directed           Current Discharge Medication List    START taking these medications   Details  hydrALAZINE (APRESOLINE) 50 MG tablet Take 1 tablet (50 mg total) by mouth every 8 (eight) hours. Qty: 90 tablet, Refills: 0    levofloxacin (LEVAQUIN) 750 MG tablet Take 1 tablet (750 mg total) by mouth every other day. Qty: 7 tablet, Refills: 0      CONTINUE these medications which have NOT CHANGED   Details  atorvastatin (LIPITOR) 40 MG tablet Take 1 tablet (40 mg total) by mouth daily. Qty: 90 tablet, Refills: 1   Associated Diagnoses: Other and unspecified hyperlipidemia    calcitRIOL (ROCALTROL) 0.25 MCG capsule Take 1 capsule (0.25 mcg total) by mouth daily. Qty: 90 capsule, Refills: 1    darbepoetin (ARANESP) 100 MCG/0.5ML SOLN injection Inject 100 mcg into the skin every 5 (five) weeks.    furosemide (LASIX) 40 MG  tablet Take 1 tablet (40 mg total) by mouth daily. Qty: 90 tablet, Refills: 1   Associated Diagnoses: Unspecified essential hypertension    glimepiride (AMARYL) 4 MG tablet 1 po qam and 1/2 in afternoon Qty: 135 tablet, Refills: 1   Associated Diagnoses: Type II or unspecified type diabetes mellitus without mention of complication, uncontrolled    levothyroxine (SYNTHROID, LEVOTHROID) 75 MCG tablet Take 1 tablet (75 mcg total) by mouth daily before breakfast. Qty: 90 tablet, Refills: 1   Associated Diagnoses: Hypothyroidism due to acquired atrophy of thyroid    losartan (COZAAR) 100 MG tablet Take 1 tablet (100 mg total) by mouth daily. Qty: 90 tablet, Refills: 1   Associated Diagnoses: Unspecified essential hypertension    metoprolol succinate (TOPROL-XL) 50 MG 24 hr tablet Take 1 tablet (50 mg total) by mouth daily. Qty: 90 tablet, Refills: 1   Associated Diagnoses: Unspecified essential hypertension    pantoprazole (PROTONIX) 40 MG tablet Take 1 tablet (40 mg total) by mouth 2 (two) times daily. Qty: 180 tablet, Refills: 1   Associated Diagnoses: Gastroesophageal reflux disease with esophagitis    sodium polystyrene (SPS) 15 GM/60ML suspension Take 60 mLs (  15 g total) by mouth once. Qty: 500 mL, Refills: 1    Glucose Blood DISK Use to check BG up to twice a day.  Dx:  250.02 Qty: 200 each, Refills: 2    metoCLOPramide (REGLAN) 5 MG tablet Take 1 tablet (5 mg total) by mouth 4 (four) times daily. Qty: 360 tablet, Refills: 1    Soft [employer] (SALINE) SOLN 1 application by Does not apply route daily. Qty: 1 Bottle, Refills: 0      STOP taking these medications     cloNIDine (CATAPRES) 0.2 MG tablet      pioglitazone (ACTOS) 30 MG tablet        Allergies  Allergen Reactions  . Aspirin     vomiting  . Ciprofloxacin Nausea And Vomiting    Upset Stomach  . Codeine     Makes me sick   . Micardis [Telmisartan]     unknown  . Rofecoxib     unknown      The  results of significant diagnostics from this hospitalization (including imaging, microbiology, ancillary and laboratory) are listed below for reference.    Significant Diagnostic Studies: Dg Chest 1 View  05/17/2014   CLINICAL DATA:  [place] today trying to get out of chair, RIGHT hip pain/fracture  EXAM: CHEST - 1 VIEW  COMPARISON:  10/06/2012  FINDINGS: Normal heart size, mediastinal contours, and pulmonary vascularity.  Mild chronic elevation of RIGHT diaphragm.  Lungs clear.  No pleural effusion or pneumothorax.  Minimal atherosclerotic calcification aortic arch.  Bones demineralized with costovertebral degenerative changes at RIGHT ninth rib.  IMPRESSION: No acute abnormalities.   Electronically Signed   By: Lavonia Dana M.D.   On: 05/17/2014 16:52   Dg Hip Complete Right  05/17/2014   CLINICAL DATA:  Fall, acute right hip pain  EXAM: RIGHT HIP - COMPLETE 2+ VIEW  COMPARISON:  None.  FINDINGS: There is an acute comminuted right hip intertrochanteric fracture. No associated subluxation or dislocation of the hip joint. Bones are osteopenic. Pelvis appears intact. Degenerative changes of both hips. Normal bowel gas pattern.  IMPRESSION: Acute comminuted right hip intertrochanteric fracture   Electronically Signed   By: Daryll Brod M.D.   On: 05/17/2014 16:51   Dg Hip Operative Right  05/18/2014   CLINICAL DATA:  Operative repair right hip fracture.  EXAM: DG OPERATIVE right HIP 1-2 VIEWS  TECHNIQUE: Fluoroscopic spot image(s) were submitted for interpretation post-operatively.  COMPARISON:  05/17/2014  FINDINGS: Examination demonstrates internal fixation of patient's right intertrochanteric hip fracture with compression screw bridging the fracture into the femoral head with associated lateral fixation plate and screws over the proximal femoral diaphysis intact and normally located. Mild degenerative change of the right hip. Recommend correlation with findings at the time of the procedure.  IMPRESSION:  Internal fixation of intertrochanteric right hip fracture with hardware normally located and intact.   Electronically Signed   By: Marin Olp M.D.   On: 05/18/2014 15:01   Ct Head Wo Contrast  05/21/2014   CLINICAL DATA:  68 year old female with acute hallucinations and altered mental status.  EXAM: CT HEAD WITHOUT CONTRAST  TECHNIQUE: Contiguous axial images were obtained from the base of the skull through the vertex without intravenous contrast.  COMPARISON:  01/23/2008  FINDINGS: Mild chronic small-vessel white matter ischemic changes are again noted.  No acute intracranial abnormalities are identified, including mass lesion or mass effect, hydrocephalus, extra-axial fluid collection, midline shift, hemorrhage, or acute infarction.  The visualized bony  calvarium is unremarkable.  IMPRESSION: No evidence of acute intracranial abnormality.  Mild chronic small-vessel white matter ischemic changes.   Electronically Signed   By: Hassan Rowan M.D.   On: 05/21/2014 16:43   Ct Angio Chest Pe W/cm &/or Wo Cm  05/22/2014   CLINICAL DATA:  Hypoxia.  Recent hip surgery  EXAM: CT ANGIOGRAPHY CHEST WITH CONTRAST  TECHNIQUE: Multidetector CT imaging of the chest was performed using the standard protocol during bolus administration of intravenous contrast. Multiplanar CT image reconstructions and MIPs were obtained to evaluate the vascular anatomy.  CONTRAST:  72mL OMNIPAQUE IOHEXOL 350 MG/ML SOLN  COMPARISON:  Chest radiograph May 21, 2014  FINDINGS: There is no demonstrable pulmonary embolus. There is no thoracic aortic aneurysm or dissection.  There is diffuse fluid throughout the esophagus to the level of the upper thoracic esophagus. Esophagus is dilated from the upper thoracic level to the gastroesophageal junction.  There is a small area of infiltrate in the posterior segment of the right upper lobe. There is rather minimal bibasilar atelectatic change.  There are multiple foci of coronary artery  calcification. There is mild left ventricular hypertrophy. There is no appreciable thoracic adenopathy. The pericardium is not thickened.  In the visualized upper abdomen, there is hepatic steatosis. There is incomplete visualization of a left adrenal mass measuring 4.7 x 4.3 cm. This mass has mixed attenuation and may contain fat.  Visualized thyroid appears normal. There is degenerative change in the thoracic spine with diffuse idiopathic skeletal hyperostosis. There are no blastic or lytic bone lesions.  Review of the MIP images confirms the above findings.  IMPRESSION: No demonstrable pulmonary embolus.  Small area of infiltrate right upper lobe posterior segment.  Most of the thoracic esophagus is somewhat dilated and fluid-filled. This finding may be due to severe esophageal dysmotility or possibly severe reflux. This finding may warrant direct visualization with manometry to further assess. Barium swallow potentially could be helpful to assess for underlying motility disorder as well.  Incomplete visualization of sizable left adrenal mass. The visualized portions of this mass suggest that this lesion has benign features. As is incompletely visualized, it may be prudent to consider adrenal MR pre and post-contrast to further assess. If there is a contraindication to MR, pre and post intravenous contrast CT abdomen to completely evaluate this lesion would be a reasonable alternative examination.  Hepatic steatosis.   Electronically Signed   By: Lowella Grip M.D.   On: 05/22/2014 10:59   Dg Chest Port 1 View  05/21/2014   CLINICAL DATA:  Pneumonia.  EXAM: PORTABLE CHEST - 1 VIEW  COMPARISON:  May 17, 2014.  FINDINGS: The heart size and mediastinal contours are within normal limits. Both lungs are clear. No pneumothorax or pleural effusion is noted. The visualized skeletal structures are unremarkable.  IMPRESSION: No acute cardiopulmonary abnormality seen.   Electronically Signed   By: Sabino Dick  M.D.   On: 05/21/2014 14:34    Microbiology: Recent Results (from the past 240 hour(s))  Culture, Urine     Status: None (Preliminary result)   Collection Time: 05/21/14  9:53 AM  Result Value Ref Range Status   Specimen Description URINE, CLEAN CATCH  Final   Special Requests NONE  Final   Colony Count   Final    >=100,000 COLONIES/ML Performed at [place] Performed at [facility]    Report Status PENDING  Incomplete     Labs: Basic Metabolic Panel:  Recent Labs Lab 05/19/14 0623 05/20/14 0750 05/21/14 0611 05/22/14 0620 05/23/14 0504  NA 138 138 140 140 137  K 4.4 3.8 3.8 3.8 3.9  CL 108 112 106 105 104  CO2 20 19 26 27 26   GLUCOSE 374* 167* 128* 83 165*  BUN 35* 27* 29* 34* 38*  CREATININE 1.47* 1.11* 1.43* 1.45* 1.71*  CALCIUM 8.4 8.0* 8.4 8.1* 7.4*   Liver Function Tests:  Recent Labs Lab 05/17/14 1658 05/17/14 2125 05/20/14 0750 05/21/14 0611 05/23/14 0504  AST 20 19 36 49* 41*  ALT 11 11 12 15 25   ALKPHOS 115 101 65 65 72  BILITOT 0.6 0.6 0.6 1.5* 0.6  PROT 7.1 6.8 5.7* 6.2 5.2*  ALBUMIN 3.7 3.5 2.9* 2.9* 2.3*   No results for input(s): LIPASE, AMYLASE in the last 168 hours.  Recent Labs Lab 05/21/14 1037  AMMONIA <9*   CBC:  Recent Labs Lab 05/17/14 1658 05/17/14 2125  05/19/14 1641 05/20/14 0750 05/21/14 0611 05/22/14 0620 05/23/14 0504  WBC 9.9 11.1*  < > 14.1* 14.4* 12.0* 9.4 8.1  NEUTROABS 7.9* 9.6*  --   --  11.0* 9.3*  --  5.5  HGB 12.1 11.4*  < > 8.4* 7.6* 10.7* 10.3* 9.8*  HCT 38.1 35.2*  < > 25.2* 23.0* 32.2* 31.7* 30.4*  MCV 93.6 92.9  < > 93.0 91.6 85.9 87.3 89.1  PLT 163 172  < > 155 154 156 156 148*  < > = values in this interval not displayed. Cardiac Enzymes: No results for input(s): CKTOTAL, CKMB, CKMBINDEX, TROPONINI in the last 168 hours. BNP: BNP (last 3 results) No results for input(s): PROBNP in the last 8760 hours. CBG:  Recent Labs Lab  05/22/14 1114 05/22/14 1655 05/22/14 2105 05/23/14 0723 05/23/14 1101  GLUCAP 156* 258* 225* 157* 193*       Signed:  Nita Sells  [facility] 05/23/2014, 11:52 AM

## 2014-05-26 LAB — URINE CULTURE: Colony Count: >=100000

## 2014-05-28 ENCOUNTER — Encounter (HOSPITAL_BASED_OUTPATIENT_CLINIC_OR_DEPARTMENT_OTHER): Payer: No Typology Code available for payment source | Attending: General Surgery

## 2014-05-28 DIAGNOSIS — S31829D Unspecified open wound of left buttock, subsequent encounter: Secondary | ICD-10-CM | POA: Insufficient documentation

## 2014-05-28 DIAGNOSIS — Y839 Surgical procedure, unspecified as the cause of abnormal reaction of the patient, or of later complication, without mention of misadventure at the time of the procedure: Secondary | ICD-10-CM | POA: Insufficient documentation

## 2014-05-28 DIAGNOSIS — T8189XD Other complications of procedures, not elsewhere classified, subsequent encounter: Secondary | ICD-10-CM | POA: Insufficient documentation

## 2014-06-04 DIAGNOSIS — T8189XD Other complications of procedures, not elsewhere classified, subsequent encounter: Secondary | ICD-10-CM | POA: Diagnosis not present

## 2014-06-04 DIAGNOSIS — S31829D Unspecified open wound of left buttock, subsequent encounter: Secondary | ICD-10-CM | POA: Diagnosis not present

## 2014-06-06 ENCOUNTER — Encounter: Payer: Self-pay | Admitting: Orthopedic Surgery

## 2014-06-06 ENCOUNTER — Ambulatory Visit (INDEPENDENT_AMBULATORY_CARE_PROVIDER_SITE_OTHER): Payer: Self-pay | Admitting: Orthopedic Surgery

## 2014-06-06 VITALS — Ht 62.0 in | Wt 192.2 lb

## 2014-06-06 DIAGNOSIS — S72141D Displaced intertrochanteric fracture of right femur, subsequent encounter for closed fracture with routine healing: Secondary | ICD-10-CM

## 2014-06-06 NOTE — Progress Notes (Signed)
Patient ID: [name], female   DOB: 1945/08/12, 69 y.o.   MRN: [id number] Chief Complaint  Patient presents with  . Follow-up    post op Rt hip compression, DOS 05/18/14    We did a dynamic hip screw for right peritrochanteric fracture. Her staples were removed her wound looks clean her leg lengths are equal rotational alignment is normal recommend x-ray within the next few days centimeter the report and then a repeat x-ray in 4 weeks at postop week #6  She is weightbearing as tolerated should continue physical therapy at [facility]

## 2014-06-24 DIAGNOSIS — R278 Other lack of coordination: Secondary | ICD-10-CM | POA: Diagnosis not present

## 2014-06-24 DIAGNOSIS — I129 Hypertensive chronic kidney disease with stage 1 through stage 4 chronic kidney disease, or unspecified chronic kidney disease: Secondary | ICD-10-CM | POA: Diagnosis not present

## 2014-06-24 DIAGNOSIS — E039 Hypothyroidism, unspecified: Secondary | ICD-10-CM | POA: Diagnosis not present

## 2014-06-24 DIAGNOSIS — R293 Abnormal posture: Secondary | ICD-10-CM | POA: Diagnosis not present

## 2014-06-24 DIAGNOSIS — E119 Type 2 diabetes mellitus without complications: Secondary | ICD-10-CM | POA: Diagnosis not present

## 2014-06-24 DIAGNOSIS — F419 Anxiety disorder, unspecified: Secondary | ICD-10-CM | POA: Diagnosis not present

## 2014-06-24 DIAGNOSIS — Z4789 Encounter for other orthopedic aftercare: Secondary | ICD-10-CM | POA: Diagnosis not present

## 2014-06-24 DIAGNOSIS — M6281 Muscle weakness (generalized): Secondary | ICD-10-CM | POA: Diagnosis not present

## 2014-06-24 DIAGNOSIS — R2689 Other abnormalities of gait and mobility: Secondary | ICD-10-CM | POA: Diagnosis not present

## 2014-06-24 DIAGNOSIS — N183 Chronic kidney disease, stage 3 (moderate): Secondary | ICD-10-CM | POA: Diagnosis not present

## 2014-06-29 DIAGNOSIS — I129 Hypertensive chronic kidney disease with stage 1 through stage 4 chronic kidney disease, or unspecified chronic kidney disease: Secondary | ICD-10-CM | POA: Diagnosis not present

## 2014-06-29 DIAGNOSIS — E119 Type 2 diabetes mellitus without complications: Secondary | ICD-10-CM | POA: Diagnosis not present

## 2014-06-29 DIAGNOSIS — Z96641 Presence of right artificial hip joint: Secondary | ICD-10-CM | POA: Diagnosis not present

## 2014-06-29 DIAGNOSIS — A1801 Tuberculosis of spine: Secondary | ICD-10-CM | POA: Diagnosis not present

## 2014-06-29 DIAGNOSIS — M6281 Muscle weakness (generalized): Secondary | ICD-10-CM | POA: Diagnosis not present

## 2014-06-29 DIAGNOSIS — Z471 Aftercare following joint replacement surgery: Secondary | ICD-10-CM | POA: Diagnosis not present

## 2014-07-01 DIAGNOSIS — Z471 Aftercare following joint replacement surgery: Secondary | ICD-10-CM | POA: Diagnosis not present

## 2014-07-01 DIAGNOSIS — I129 Hypertensive chronic kidney disease with stage 1 through stage 4 chronic kidney disease, or unspecified chronic kidney disease: Secondary | ICD-10-CM | POA: Diagnosis not present

## 2014-07-01 DIAGNOSIS — M6281 Muscle weakness (generalized): Secondary | ICD-10-CM | POA: Diagnosis not present

## 2014-07-01 DIAGNOSIS — A1801 Tuberculosis of spine: Secondary | ICD-10-CM | POA: Diagnosis not present

## 2014-07-01 DIAGNOSIS — E119 Type 2 diabetes mellitus without complications: Secondary | ICD-10-CM | POA: Diagnosis not present

## 2014-07-01 DIAGNOSIS — Z96641 Presence of right artificial hip joint: Secondary | ICD-10-CM | POA: Diagnosis not present

## 2014-07-02 ENCOUNTER — Encounter (HOSPITAL_BASED_OUTPATIENT_CLINIC_OR_DEPARTMENT_OTHER): Payer: Medicare Other | Attending: General Surgery

## 2014-07-02 DIAGNOSIS — Z96641 Presence of right artificial hip joint: Secondary | ICD-10-CM | POA: Diagnosis not present

## 2014-07-02 DIAGNOSIS — Z471 Aftercare following joint replacement surgery: Secondary | ICD-10-CM | POA: Diagnosis not present

## 2014-07-02 DIAGNOSIS — E119 Type 2 diabetes mellitus without complications: Secondary | ICD-10-CM | POA: Diagnosis not present

## 2014-07-02 DIAGNOSIS — A1801 Tuberculosis of spine: Secondary | ICD-10-CM | POA: Diagnosis not present

## 2014-07-02 DIAGNOSIS — M6281 Muscle weakness (generalized): Secondary | ICD-10-CM | POA: Diagnosis not present

## 2014-07-02 DIAGNOSIS — I129 Hypertensive chronic kidney disease with stage 1 through stage 4 chronic kidney disease, or unspecified chronic kidney disease: Secondary | ICD-10-CM | POA: Diagnosis not present

## 2014-07-03 ENCOUNTER — Ambulatory Visit: Payer: Medicare Other | Admitting: Nurse Practitioner

## 2014-07-04 ENCOUNTER — Ambulatory Visit (INDEPENDENT_AMBULATORY_CARE_PROVIDER_SITE_OTHER): Payer: Self-pay | Admitting: Orthopedic Surgery

## 2014-07-04 ENCOUNTER — Other Ambulatory Visit: Payer: Self-pay | Admitting: Orthopedic Surgery

## 2014-07-04 ENCOUNTER — Ambulatory Visit (HOSPITAL_COMMUNITY)
Admission: RE | Admit: 2014-07-04 | Discharge: 2014-07-04 | Disposition: A | Discharge status: Home / Self Care | Payer: Medicare Other | Source: Ambulatory Visit | Attending: Orthopedic Surgery | Admitting: Orthopedic Surgery

## 2014-07-04 ENCOUNTER — Encounter: Payer: Self-pay | Admitting: Orthopedic Surgery

## 2014-07-04 VITALS — BP 165/72 | Ht 62.0 in | Wt 192.0 lb

## 2014-07-04 DIAGNOSIS — I129 Hypertensive chronic kidney disease with stage 1 through stage 4 chronic kidney disease, or unspecified chronic kidney disease: Secondary | ICD-10-CM | POA: Diagnosis not present

## 2014-07-04 DIAGNOSIS — Z96641 Presence of right artificial hip joint: Secondary | ICD-10-CM | POA: Diagnosis not present

## 2014-07-04 DIAGNOSIS — E119 Type 2 diabetes mellitus without complications: Secondary | ICD-10-CM | POA: Diagnosis not present

## 2014-07-04 DIAGNOSIS — S72141D Displaced intertrochanteric fracture of right femur, subsequent encounter for closed fracture with routine healing: Secondary | ICD-10-CM

## 2014-07-04 DIAGNOSIS — S72144D Nondisplaced intertrochanteric fracture of right femur, subsequent encounter for closed fracture with routine healing: Secondary | ICD-10-CM | POA: Diagnosis not present

## 2014-07-04 DIAGNOSIS — S72001A Fracture of unspecified part of neck of right femur, initial encounter for closed fracture: Secondary | ICD-10-CM

## 2014-07-04 DIAGNOSIS — Z471 Aftercare following joint replacement surgery: Secondary | ICD-10-CM | POA: Diagnosis not present

## 2014-07-04 DIAGNOSIS — X58XXXA Exposure to other specified factors, initial encounter: Secondary | ICD-10-CM | POA: Insufficient documentation

## 2014-07-04 DIAGNOSIS — A1801 Tuberculosis of spine: Secondary | ICD-10-CM | POA: Diagnosis not present

## 2014-07-04 DIAGNOSIS — M6281 Muscle weakness (generalized): Secondary | ICD-10-CM | POA: Diagnosis not present

## 2014-07-04 NOTE — Progress Notes (Signed)
Chief Complaint  Patient presents with  . Follow-up    Recheck right hip, DOS 05-18-14. Xrays at [facility] prior.    Week postop right intertrochanteric fracture treated with dynamic hip screw. X-rays pending but patient is an plating well receiving physical therapy not having any difficulties  Her leg lengths were equal rotatory alignment normal hip flexion normal painless  Recommend x-ray in 6 weeks continue weightbearing as tolerated. I will review the x-rays when they become available.  Addendum fracture is healing expected sliding has occurred in the dynamic hip screw. 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

## 2014-07-05 DIAGNOSIS — Z471 Aftercare following joint replacement surgery: Secondary | ICD-10-CM | POA: Diagnosis not present

## 2014-07-05 DIAGNOSIS — A1801 Tuberculosis of spine: Secondary | ICD-10-CM | POA: Diagnosis not present

## 2014-07-05 DIAGNOSIS — E119 Type 2 diabetes mellitus without complications: Secondary | ICD-10-CM | POA: Diagnosis not present

## 2014-07-05 DIAGNOSIS — I129 Hypertensive chronic kidney disease with stage 1 through stage 4 chronic kidney disease, or unspecified chronic kidney disease: Secondary | ICD-10-CM | POA: Diagnosis not present

## 2014-07-05 DIAGNOSIS — M6281 Muscle weakness (generalized): Secondary | ICD-10-CM | POA: Diagnosis not present

## 2014-07-05 DIAGNOSIS — Z96641 Presence of right artificial hip joint: Secondary | ICD-10-CM | POA: Diagnosis not present

## 2014-07-08 DIAGNOSIS — A1801 Tuberculosis of spine: Secondary | ICD-10-CM | POA: Diagnosis not present

## 2014-07-08 DIAGNOSIS — E119 Type 2 diabetes mellitus without complications: Secondary | ICD-10-CM | POA: Diagnosis not present

## 2014-07-08 DIAGNOSIS — Z96641 Presence of right artificial hip joint: Secondary | ICD-10-CM | POA: Diagnosis not present

## 2014-07-08 DIAGNOSIS — M6281 Muscle weakness (generalized): Secondary | ICD-10-CM | POA: Diagnosis not present

## 2014-07-08 DIAGNOSIS — I129 Hypertensive chronic kidney disease with stage 1 through stage 4 chronic kidney disease, or unspecified chronic kidney disease: Secondary | ICD-10-CM | POA: Diagnosis not present

## 2014-07-08 DIAGNOSIS — Z471 Aftercare following joint replacement surgery: Secondary | ICD-10-CM | POA: Diagnosis not present

## 2014-07-09 DIAGNOSIS — I129 Hypertensive chronic kidney disease with stage 1 through stage 4 chronic kidney disease, or unspecified chronic kidney disease: Secondary | ICD-10-CM | POA: Diagnosis not present

## 2014-07-09 DIAGNOSIS — A1801 Tuberculosis of spine: Secondary | ICD-10-CM | POA: Diagnosis not present

## 2014-07-09 DIAGNOSIS — E119 Type 2 diabetes mellitus without complications: Secondary | ICD-10-CM | POA: Diagnosis not present

## 2014-07-09 DIAGNOSIS — M6281 Muscle weakness (generalized): Secondary | ICD-10-CM | POA: Diagnosis not present

## 2014-07-09 DIAGNOSIS — Z96641 Presence of right artificial hip joint: Secondary | ICD-10-CM | POA: Diagnosis not present

## 2014-07-09 DIAGNOSIS — Z471 Aftercare following joint replacement surgery: Secondary | ICD-10-CM | POA: Diagnosis not present

## 2014-07-11 DIAGNOSIS — E119 Type 2 diabetes mellitus without complications: Secondary | ICD-10-CM | POA: Diagnosis not present

## 2014-07-11 DIAGNOSIS — M6281 Muscle weakness (generalized): Secondary | ICD-10-CM | POA: Diagnosis not present

## 2014-07-11 DIAGNOSIS — Z96641 Presence of right artificial hip joint: Secondary | ICD-10-CM | POA: Diagnosis not present

## 2014-07-11 DIAGNOSIS — Z471 Aftercare following joint replacement surgery: Secondary | ICD-10-CM | POA: Diagnosis not present

## 2014-07-11 DIAGNOSIS — A1801 Tuberculosis of spine: Secondary | ICD-10-CM | POA: Diagnosis not present

## 2014-07-11 DIAGNOSIS — I129 Hypertensive chronic kidney disease with stage 1 through stage 4 chronic kidney disease, or unspecified chronic kidney disease: Secondary | ICD-10-CM | POA: Diagnosis not present

## 2014-07-12 ENCOUNTER — Telehealth: Payer: Self-pay | Admitting: Nurse Practitioner

## 2014-07-12 DIAGNOSIS — I129 Hypertensive chronic kidney disease with stage 1 through stage 4 chronic kidney disease, or unspecified chronic kidney disease: Secondary | ICD-10-CM | POA: Diagnosis not present

## 2014-07-12 DIAGNOSIS — Z471 Aftercare following joint replacement surgery: Secondary | ICD-10-CM | POA: Diagnosis not present

## 2014-07-12 DIAGNOSIS — M6281 Muscle weakness (generalized): Secondary | ICD-10-CM | POA: Diagnosis not present

## 2014-07-12 DIAGNOSIS — Z96641 Presence of right artificial hip joint: Secondary | ICD-10-CM | POA: Diagnosis not present

## 2014-07-12 DIAGNOSIS — E119 Type 2 diabetes mellitus without complications: Secondary | ICD-10-CM | POA: Diagnosis not present

## 2014-07-12 DIAGNOSIS — A1801 Tuberculosis of spine: Secondary | ICD-10-CM | POA: Diagnosis not present

## 2014-07-12 NOTE — Telephone Encounter (Signed)
Gave verbal order per Dr. Laurance Flatten

## 2014-07-15 DIAGNOSIS — I129 Hypertensive chronic kidney disease with stage 1 through stage 4 chronic kidney disease, or unspecified chronic kidney disease: Secondary | ICD-10-CM | POA: Diagnosis not present

## 2014-07-15 DIAGNOSIS — A1801 Tuberculosis of spine: Secondary | ICD-10-CM | POA: Diagnosis not present

## 2014-07-15 DIAGNOSIS — Z471 Aftercare following joint replacement surgery: Secondary | ICD-10-CM | POA: Diagnosis not present

## 2014-07-15 DIAGNOSIS — M6281 Muscle weakness (generalized): Secondary | ICD-10-CM | POA: Diagnosis not present

## 2014-07-15 DIAGNOSIS — Z96641 Presence of right artificial hip joint: Secondary | ICD-10-CM | POA: Diagnosis not present

## 2014-07-15 DIAGNOSIS — E119 Type 2 diabetes mellitus without complications: Secondary | ICD-10-CM | POA: Diagnosis not present

## 2014-07-16 DIAGNOSIS — Z471 Aftercare following joint replacement surgery: Secondary | ICD-10-CM | POA: Diagnosis not present

## 2014-07-16 DIAGNOSIS — A1801 Tuberculosis of spine: Secondary | ICD-10-CM | POA: Diagnosis not present

## 2014-07-16 DIAGNOSIS — E119 Type 2 diabetes mellitus without complications: Secondary | ICD-10-CM | POA: Diagnosis not present

## 2014-07-16 DIAGNOSIS — I129 Hypertensive chronic kidney disease with stage 1 through stage 4 chronic kidney disease, or unspecified chronic kidney disease: Secondary | ICD-10-CM | POA: Diagnosis not present

## 2014-07-16 DIAGNOSIS — M6281 Muscle weakness (generalized): Secondary | ICD-10-CM | POA: Diagnosis not present

## 2014-07-16 DIAGNOSIS — Z96641 Presence of right artificial hip joint: Secondary | ICD-10-CM | POA: Diagnosis not present

## 2014-07-18 DIAGNOSIS — M6281 Muscle weakness (generalized): Secondary | ICD-10-CM | POA: Diagnosis not present

## 2014-07-18 DIAGNOSIS — E119 Type 2 diabetes mellitus without complications: Secondary | ICD-10-CM | POA: Diagnosis not present

## 2014-07-18 DIAGNOSIS — Z96641 Presence of right artificial hip joint: Secondary | ICD-10-CM | POA: Diagnosis not present

## 2014-07-18 DIAGNOSIS — Z471 Aftercare following joint replacement surgery: Secondary | ICD-10-CM | POA: Diagnosis not present

## 2014-07-18 DIAGNOSIS — I129 Hypertensive chronic kidney disease with stage 1 through stage 4 chronic kidney disease, or unspecified chronic kidney disease: Secondary | ICD-10-CM | POA: Diagnosis not present

## 2014-07-18 DIAGNOSIS — A1801 Tuberculosis of spine: Secondary | ICD-10-CM | POA: Diagnosis not present

## 2014-07-19 DIAGNOSIS — I129 Hypertensive chronic kidney disease with stage 1 through stage 4 chronic kidney disease, or unspecified chronic kidney disease: Secondary | ICD-10-CM | POA: Diagnosis not present

## 2014-07-19 DIAGNOSIS — Z96641 Presence of right artificial hip joint: Secondary | ICD-10-CM | POA: Diagnosis not present

## 2014-07-19 DIAGNOSIS — M6281 Muscle weakness (generalized): Secondary | ICD-10-CM | POA: Diagnosis not present

## 2014-07-19 DIAGNOSIS — A1801 Tuberculosis of spine: Secondary | ICD-10-CM | POA: Diagnosis not present

## 2014-07-19 DIAGNOSIS — E119 Type 2 diabetes mellitus without complications: Secondary | ICD-10-CM | POA: Diagnosis not present

## 2014-07-19 DIAGNOSIS — Z471 Aftercare following joint replacement surgery: Secondary | ICD-10-CM | POA: Diagnosis not present

## 2014-07-22 ENCOUNTER — Encounter (HOSPITAL_COMMUNITY)
Admission: RE | Admit: 2014-07-22 | Discharge: 2014-07-22 | Disposition: A | Discharge status: Home / Self Care | Payer: Medicare Other | Source: Ambulatory Visit | Attending: Nephrology | Admitting: Nephrology

## 2014-07-22 ENCOUNTER — Encounter (HOSPITAL_COMMUNITY): Payer: Self-pay

## 2014-07-22 DIAGNOSIS — D631 Anemia in chronic kidney disease: Secondary | ICD-10-CM | POA: Insufficient documentation

## 2014-07-22 DIAGNOSIS — N183 Chronic kidney disease, stage 3 (moderate): Secondary | ICD-10-CM | POA: Insufficient documentation

## 2014-07-22 LAB — HEMOGLOBIN AND HEMATOCRIT, BLOOD
HCT: 34.7 % — ABNORMAL LOW (ref 36.0–46.0)
HEMOGLOBIN: 11 g/dL — AB (ref 12.0–15.0)

## 2014-07-22 MED ORDER — DARBEPOETIN ALFA 100 MCG/0.5ML IJ SOSY
PREFILLED_SYRINGE | INTRAMUSCULAR | Status: AC
Start: 2014-07-22 — End: 2014-07-22
  Filled 2014-07-22: qty 0.5

## 2014-07-22 MED ORDER — DARBEPOETIN ALFA 100 MCG/0.5ML IJ SOSY
100.0000 ug | PREFILLED_SYRINGE | INTRAMUSCULAR | Status: DC
  Administered 2014-07-22: 100 ug via SUBCUTANEOUS

## 2014-07-23 DIAGNOSIS — Z471 Aftercare following joint replacement surgery: Secondary | ICD-10-CM | POA: Diagnosis not present

## 2014-07-23 DIAGNOSIS — E119 Type 2 diabetes mellitus without complications: Secondary | ICD-10-CM | POA: Diagnosis not present

## 2014-07-23 DIAGNOSIS — A1801 Tuberculosis of spine: Secondary | ICD-10-CM | POA: Diagnosis not present

## 2014-07-23 DIAGNOSIS — M6281 Muscle weakness (generalized): Secondary | ICD-10-CM | POA: Diagnosis not present

## 2014-07-23 DIAGNOSIS — I129 Hypertensive chronic kidney disease with stage 1 through stage 4 chronic kidney disease, or unspecified chronic kidney disease: Secondary | ICD-10-CM | POA: Diagnosis not present

## 2014-07-23 DIAGNOSIS — Z96641 Presence of right artificial hip joint: Secondary | ICD-10-CM | POA: Diagnosis not present

## 2014-07-23 LAB — IRON AND TIBC
Iron: 62 ug/dL (ref 42–145)
SATURATION RATIOS: 28 % (ref 20–55)
TIBC: 221 ug/dL — AB (ref 250–470)
UIBC: 159 ug/dL (ref 125–400)

## 2014-07-23 LAB — FERRITIN: FERRITIN: 721 ng/mL — AB (ref 10–291)

## 2014-07-23 NOTE — Progress Notes (Signed)
Results for ELEAN, KARRAS (MRN [id number]) as of 07/23/2014 07:56  Ref. Range 07/22/2014 12:30  Iron Latest Range: 42-145 ug/dL 62  UIBC Latest Range: 125-400 ug/dL 159  TIBC Latest Range: 250-470 ug/dL 221 (L)  Saturation Ratios Latest Range: 20-55 % 28  Ferritin Latest Range: 10-291 ng/mL 721 (H)  Hemoglobin Latest Range: 12.0-15.0 g/dL 11.0 (L)  HCT Latest Range: 36.0-46.0 % 34.7 (L)

## 2014-07-24 DIAGNOSIS — Z96641 Presence of right artificial hip joint: Secondary | ICD-10-CM | POA: Diagnosis not present

## 2014-07-24 DIAGNOSIS — M6281 Muscle weakness (generalized): Secondary | ICD-10-CM | POA: Diagnosis not present

## 2014-07-24 DIAGNOSIS — I129 Hypertensive chronic kidney disease with stage 1 through stage 4 chronic kidney disease, or unspecified chronic kidney disease: Secondary | ICD-10-CM | POA: Diagnosis not present

## 2014-07-24 DIAGNOSIS — A1801 Tuberculosis of spine: Secondary | ICD-10-CM | POA: Diagnosis not present

## 2014-07-24 DIAGNOSIS — E119 Type 2 diabetes mellitus without complications: Secondary | ICD-10-CM | POA: Diagnosis not present

## 2014-07-24 DIAGNOSIS — Z471 Aftercare following joint replacement surgery: Secondary | ICD-10-CM | POA: Diagnosis not present

## 2014-07-25 DIAGNOSIS — A1801 Tuberculosis of spine: Secondary | ICD-10-CM | POA: Diagnosis not present

## 2014-07-25 DIAGNOSIS — M6281 Muscle weakness (generalized): Secondary | ICD-10-CM | POA: Diagnosis not present

## 2014-07-25 DIAGNOSIS — Z471 Aftercare following joint replacement surgery: Secondary | ICD-10-CM | POA: Diagnosis not present

## 2014-07-25 DIAGNOSIS — E119 Type 2 diabetes mellitus without complications: Secondary | ICD-10-CM | POA: Diagnosis not present

## 2014-07-25 DIAGNOSIS — Z96641 Presence of right artificial hip joint: Secondary | ICD-10-CM | POA: Diagnosis not present

## 2014-07-25 DIAGNOSIS — I129 Hypertensive chronic kidney disease with stage 1 through stage 4 chronic kidney disease, or unspecified chronic kidney disease: Secondary | ICD-10-CM | POA: Diagnosis not present

## 2014-07-29 DIAGNOSIS — I129 Hypertensive chronic kidney disease with stage 1 through stage 4 chronic kidney disease, or unspecified chronic kidney disease: Secondary | ICD-10-CM | POA: Diagnosis not present

## 2014-07-29 DIAGNOSIS — Z96641 Presence of right artificial hip joint: Secondary | ICD-10-CM | POA: Diagnosis not present

## 2014-07-29 DIAGNOSIS — M6281 Muscle weakness (generalized): Secondary | ICD-10-CM | POA: Diagnosis not present

## 2014-07-29 DIAGNOSIS — Z471 Aftercare following joint replacement surgery: Secondary | ICD-10-CM | POA: Diagnosis not present

## 2014-07-29 DIAGNOSIS — A1801 Tuberculosis of spine: Secondary | ICD-10-CM | POA: Diagnosis not present

## 2014-07-29 DIAGNOSIS — E119 Type 2 diabetes mellitus without complications: Secondary | ICD-10-CM | POA: Diagnosis not present

## 2014-07-30 ENCOUNTER — Encounter (HOSPITAL_BASED_OUTPATIENT_CLINIC_OR_DEPARTMENT_OTHER): Payer: Medicare Other | Attending: General Surgery

## 2014-07-30 DIAGNOSIS — L03031 Cellulitis of right toe: Secondary | ICD-10-CM | POA: Diagnosis not present

## 2014-07-30 DIAGNOSIS — T814XXA Infection following a procedure, initial encounter: Secondary | ICD-10-CM | POA: Diagnosis not present

## 2014-07-30 DIAGNOSIS — Y838 Other surgical procedures as the cause of abnormal reaction of the patient, or of later complication, without mention of misadventure at the time of the procedure: Secondary | ICD-10-CM | POA: Diagnosis not present

## 2014-07-30 DIAGNOSIS — K611 Rectal abscess: Secondary | ICD-10-CM | POA: Diagnosis not present

## 2014-07-31 DIAGNOSIS — M6281 Muscle weakness (generalized): Secondary | ICD-10-CM | POA: Diagnosis not present

## 2014-07-31 DIAGNOSIS — Z96641 Presence of right artificial hip joint: Secondary | ICD-10-CM | POA: Diagnosis not present

## 2014-07-31 DIAGNOSIS — E119 Type 2 diabetes mellitus without complications: Secondary | ICD-10-CM | POA: Diagnosis not present

## 2014-07-31 DIAGNOSIS — Z471 Aftercare following joint replacement surgery: Secondary | ICD-10-CM | POA: Diagnosis not present

## 2014-07-31 DIAGNOSIS — I129 Hypertensive chronic kidney disease with stage 1 through stage 4 chronic kidney disease, or unspecified chronic kidney disease: Secondary | ICD-10-CM | POA: Diagnosis not present

## 2014-07-31 DIAGNOSIS — A1801 Tuberculosis of spine: Secondary | ICD-10-CM | POA: Diagnosis not present

## 2014-08-01 DIAGNOSIS — A1801 Tuberculosis of spine: Secondary | ICD-10-CM | POA: Diagnosis not present

## 2014-08-01 DIAGNOSIS — M6281 Muscle weakness (generalized): Secondary | ICD-10-CM | POA: Diagnosis not present

## 2014-08-01 DIAGNOSIS — Z96641 Presence of right artificial hip joint: Secondary | ICD-10-CM | POA: Diagnosis not present

## 2014-08-01 DIAGNOSIS — I129 Hypertensive chronic kidney disease with stage 1 through stage 4 chronic kidney disease, or unspecified chronic kidney disease: Secondary | ICD-10-CM | POA: Diagnosis not present

## 2014-08-01 DIAGNOSIS — E119 Type 2 diabetes mellitus without complications: Secondary | ICD-10-CM | POA: Diagnosis not present

## 2014-08-01 DIAGNOSIS — Z471 Aftercare following joint replacement surgery: Secondary | ICD-10-CM | POA: Diagnosis not present

## 2014-08-02 DIAGNOSIS — M6281 Muscle weakness (generalized): Secondary | ICD-10-CM | POA: Diagnosis not present

## 2014-08-02 DIAGNOSIS — I129 Hypertensive chronic kidney disease with stage 1 through stage 4 chronic kidney disease, or unspecified chronic kidney disease: Secondary | ICD-10-CM | POA: Diagnosis not present

## 2014-08-02 DIAGNOSIS — A1801 Tuberculosis of spine: Secondary | ICD-10-CM | POA: Diagnosis not present

## 2014-08-02 DIAGNOSIS — Z96641 Presence of right artificial hip joint: Secondary | ICD-10-CM | POA: Diagnosis not present

## 2014-08-02 DIAGNOSIS — Z471 Aftercare following joint replacement surgery: Secondary | ICD-10-CM | POA: Diagnosis not present

## 2014-08-02 DIAGNOSIS — E119 Type 2 diabetes mellitus without complications: Secondary | ICD-10-CM | POA: Diagnosis not present

## 2014-08-05 DIAGNOSIS — I129 Hypertensive chronic kidney disease with stage 1 through stage 4 chronic kidney disease, or unspecified chronic kidney disease: Secondary | ICD-10-CM | POA: Diagnosis not present

## 2014-08-05 DIAGNOSIS — E119 Type 2 diabetes mellitus without complications: Secondary | ICD-10-CM | POA: Diagnosis not present

## 2014-08-05 DIAGNOSIS — Z471 Aftercare following joint replacement surgery: Secondary | ICD-10-CM | POA: Diagnosis not present

## 2014-08-05 DIAGNOSIS — M6281 Muscle weakness (generalized): Secondary | ICD-10-CM | POA: Diagnosis not present

## 2014-08-05 DIAGNOSIS — A1801 Tuberculosis of spine: Secondary | ICD-10-CM | POA: Diagnosis not present

## 2014-08-05 DIAGNOSIS — Z96641 Presence of right artificial hip joint: Secondary | ICD-10-CM | POA: Diagnosis not present

## 2014-08-06 DIAGNOSIS — A1801 Tuberculosis of spine: Secondary | ICD-10-CM | POA: Diagnosis not present

## 2014-08-06 DIAGNOSIS — I129 Hypertensive chronic kidney disease with stage 1 through stage 4 chronic kidney disease, or unspecified chronic kidney disease: Secondary | ICD-10-CM | POA: Diagnosis not present

## 2014-08-06 DIAGNOSIS — M6281 Muscle weakness (generalized): Secondary | ICD-10-CM | POA: Diagnosis not present

## 2014-08-06 DIAGNOSIS — Z96641 Presence of right artificial hip joint: Secondary | ICD-10-CM | POA: Diagnosis not present

## 2014-08-06 DIAGNOSIS — Z471 Aftercare following joint replacement surgery: Secondary | ICD-10-CM | POA: Diagnosis not present

## 2014-08-06 DIAGNOSIS — E119 Type 2 diabetes mellitus without complications: Secondary | ICD-10-CM | POA: Diagnosis not present

## 2014-08-08 DIAGNOSIS — Z471 Aftercare following joint replacement surgery: Secondary | ICD-10-CM | POA: Diagnosis not present

## 2014-08-08 DIAGNOSIS — A1801 Tuberculosis of spine: Secondary | ICD-10-CM | POA: Diagnosis not present

## 2014-08-08 DIAGNOSIS — M6281 Muscle weakness (generalized): Secondary | ICD-10-CM | POA: Diagnosis not present

## 2014-08-08 DIAGNOSIS — Z96641 Presence of right artificial hip joint: Secondary | ICD-10-CM | POA: Diagnosis not present

## 2014-08-08 DIAGNOSIS — I129 Hypertensive chronic kidney disease with stage 1 through stage 4 chronic kidney disease, or unspecified chronic kidney disease: Secondary | ICD-10-CM | POA: Diagnosis not present

## 2014-08-08 DIAGNOSIS — E119 Type 2 diabetes mellitus without complications: Secondary | ICD-10-CM | POA: Diagnosis not present

## 2014-08-09 DIAGNOSIS — M6281 Muscle weakness (generalized): Secondary | ICD-10-CM | POA: Diagnosis not present

## 2014-08-09 DIAGNOSIS — Z96641 Presence of right artificial hip joint: Secondary | ICD-10-CM | POA: Diagnosis not present

## 2014-08-09 DIAGNOSIS — I129 Hypertensive chronic kidney disease with stage 1 through stage 4 chronic kidney disease, or unspecified chronic kidney disease: Secondary | ICD-10-CM | POA: Diagnosis not present

## 2014-08-09 DIAGNOSIS — E119 Type 2 diabetes mellitus without complications: Secondary | ICD-10-CM | POA: Diagnosis not present

## 2014-08-09 DIAGNOSIS — A1801 Tuberculosis of spine: Secondary | ICD-10-CM | POA: Diagnosis not present

## 2014-08-09 DIAGNOSIS — Z471 Aftercare following joint replacement surgery: Secondary | ICD-10-CM | POA: Diagnosis not present

## 2014-08-12 DIAGNOSIS — Z96641 Presence of right artificial hip joint: Secondary | ICD-10-CM | POA: Diagnosis not present

## 2014-08-12 DIAGNOSIS — A1801 Tuberculosis of spine: Secondary | ICD-10-CM | POA: Diagnosis not present

## 2014-08-12 DIAGNOSIS — I129 Hypertensive chronic kidney disease with stage 1 through stage 4 chronic kidney disease, or unspecified chronic kidney disease: Secondary | ICD-10-CM | POA: Diagnosis not present

## 2014-08-12 DIAGNOSIS — E119 Type 2 diabetes mellitus without complications: Secondary | ICD-10-CM | POA: Diagnosis not present

## 2014-08-12 DIAGNOSIS — Z471 Aftercare following joint replacement surgery: Secondary | ICD-10-CM | POA: Diagnosis not present

## 2014-08-12 DIAGNOSIS — M6281 Muscle weakness (generalized): Secondary | ICD-10-CM | POA: Diagnosis not present

## 2014-08-13 DIAGNOSIS — L03031 Cellulitis of right toe: Secondary | ICD-10-CM | POA: Diagnosis not present

## 2014-08-15 ENCOUNTER — Ambulatory Visit (INDEPENDENT_AMBULATORY_CARE_PROVIDER_SITE_OTHER): Payer: Medicare Other

## 2014-08-15 ENCOUNTER — Ambulatory Visit (INDEPENDENT_AMBULATORY_CARE_PROVIDER_SITE_OTHER): Payer: Self-pay | Admitting: Orthopedic Surgery

## 2014-08-15 VITALS — BP 151/48 | Ht 62.0 in | Wt 192.0 lb

## 2014-08-15 DIAGNOSIS — S72141D Displaced intertrochanteric fracture of right femur, subsequent encounter for closed fracture with routine healing: Secondary | ICD-10-CM

## 2014-08-15 DIAGNOSIS — A1801 Tuberculosis of spine: Secondary | ICD-10-CM | POA: Diagnosis not present

## 2014-08-15 DIAGNOSIS — E119 Type 2 diabetes mellitus without complications: Secondary | ICD-10-CM | POA: Diagnosis not present

## 2014-08-15 DIAGNOSIS — Z471 Aftercare following joint replacement surgery: Secondary | ICD-10-CM | POA: Diagnosis not present

## 2014-08-15 DIAGNOSIS — I129 Hypertensive chronic kidney disease with stage 1 through stage 4 chronic kidney disease, or unspecified chronic kidney disease: Secondary | ICD-10-CM | POA: Diagnosis not present

## 2014-08-15 DIAGNOSIS — Z96641 Presence of right artificial hip joint: Secondary | ICD-10-CM | POA: Diagnosis not present

## 2014-08-15 DIAGNOSIS — M6281 Muscle weakness (generalized): Secondary | ICD-10-CM | POA: Diagnosis not present

## 2014-08-15 NOTE — Progress Notes (Signed)
Subjective:     [name] is a 69 y.o.  Chief Complaint  Patient presents with  . Follow-up    6 week follow up + xray Right hip compression, DOS 05/18/14    The following portions of the patient's history were reviewed and updated as appropriate: allergies, current medications, past family history, past medical history, past social history, past surgical history and problem list.    Objective:    General:   alert and cooperative  Gait:    she is ambulatory now with a walker and sometimes cane receiving therapy at home   Right hip flexion is limited actively with passive flexion is normal she has no pain with range of motion of her hip                          Imaging X-ray right hip shows normal callus formation appropriate sliding of the hip screw with visual Fracture line inferior to the hip screw: Normal callus formation at fracture site with acceptable alignment.    Assessment:   AssessmentResolving fracture without complication.    Plan:  Recommend extending therapy now for more weeks to work on hip flexion in gait.  Follow-up 6 weeks x-ray again.

## 2014-08-16 DIAGNOSIS — Z471 Aftercare following joint replacement surgery: Secondary | ICD-10-CM | POA: Diagnosis not present

## 2014-08-16 DIAGNOSIS — A1801 Tuberculosis of spine: Secondary | ICD-10-CM | POA: Diagnosis not present

## 2014-08-16 DIAGNOSIS — Z96641 Presence of right artificial hip joint: Secondary | ICD-10-CM | POA: Diagnosis not present

## 2014-08-16 DIAGNOSIS — I129 Hypertensive chronic kidney disease with stage 1 through stage 4 chronic kidney disease, or unspecified chronic kidney disease: Secondary | ICD-10-CM | POA: Diagnosis not present

## 2014-08-16 DIAGNOSIS — E119 Type 2 diabetes mellitus without complications: Secondary | ICD-10-CM | POA: Diagnosis not present

## 2014-08-16 DIAGNOSIS — M6281 Muscle weakness (generalized): Secondary | ICD-10-CM | POA: Diagnosis not present

## 2014-08-19 DIAGNOSIS — I129 Hypertensive chronic kidney disease with stage 1 through stage 4 chronic kidney disease, or unspecified chronic kidney disease: Secondary | ICD-10-CM | POA: Diagnosis not present

## 2014-08-19 DIAGNOSIS — A1801 Tuberculosis of spine: Secondary | ICD-10-CM | POA: Diagnosis not present

## 2014-08-19 DIAGNOSIS — Z471 Aftercare following joint replacement surgery: Secondary | ICD-10-CM | POA: Diagnosis not present

## 2014-08-19 DIAGNOSIS — M6281 Muscle weakness (generalized): Secondary | ICD-10-CM | POA: Diagnosis not present

## 2014-08-19 DIAGNOSIS — E119 Type 2 diabetes mellitus without complications: Secondary | ICD-10-CM | POA: Diagnosis not present

## 2014-08-19 DIAGNOSIS — Z96641 Presence of right artificial hip joint: Secondary | ICD-10-CM | POA: Diagnosis not present

## 2014-08-21 DIAGNOSIS — Z471 Aftercare following joint replacement surgery: Secondary | ICD-10-CM | POA: Diagnosis not present

## 2014-08-21 DIAGNOSIS — A1801 Tuberculosis of spine: Secondary | ICD-10-CM | POA: Diagnosis not present

## 2014-08-21 DIAGNOSIS — E119 Type 2 diabetes mellitus without complications: Secondary | ICD-10-CM | POA: Diagnosis not present

## 2014-08-21 DIAGNOSIS — I129 Hypertensive chronic kidney disease with stage 1 through stage 4 chronic kidney disease, or unspecified chronic kidney disease: Secondary | ICD-10-CM | POA: Diagnosis not present

## 2014-08-21 DIAGNOSIS — Z96641 Presence of right artificial hip joint: Secondary | ICD-10-CM | POA: Diagnosis not present

## 2014-08-21 DIAGNOSIS — M6281 Muscle weakness (generalized): Secondary | ICD-10-CM | POA: Diagnosis not present

## 2014-08-22 DIAGNOSIS — A1801 Tuberculosis of spine: Secondary | ICD-10-CM | POA: Diagnosis not present

## 2014-08-22 DIAGNOSIS — I129 Hypertensive chronic kidney disease with stage 1 through stage 4 chronic kidney disease, or unspecified chronic kidney disease: Secondary | ICD-10-CM | POA: Diagnosis not present

## 2014-08-22 DIAGNOSIS — M6281 Muscle weakness (generalized): Secondary | ICD-10-CM | POA: Diagnosis not present

## 2014-08-22 DIAGNOSIS — Z96641 Presence of right artificial hip joint: Secondary | ICD-10-CM | POA: Diagnosis not present

## 2014-08-22 DIAGNOSIS — Z471 Aftercare following joint replacement surgery: Secondary | ICD-10-CM | POA: Diagnosis not present

## 2014-08-22 DIAGNOSIS — E119 Type 2 diabetes mellitus without complications: Secondary | ICD-10-CM | POA: Diagnosis not present

## 2014-08-23 DIAGNOSIS — A1801 Tuberculosis of spine: Secondary | ICD-10-CM | POA: Diagnosis not present

## 2014-08-23 DIAGNOSIS — Z96641 Presence of right artificial hip joint: Secondary | ICD-10-CM | POA: Diagnosis not present

## 2014-08-23 DIAGNOSIS — Z471 Aftercare following joint replacement surgery: Secondary | ICD-10-CM | POA: Diagnosis not present

## 2014-08-23 DIAGNOSIS — I129 Hypertensive chronic kidney disease with stage 1 through stage 4 chronic kidney disease, or unspecified chronic kidney disease: Secondary | ICD-10-CM | POA: Diagnosis not present

## 2014-08-23 DIAGNOSIS — M6281 Muscle weakness (generalized): Secondary | ICD-10-CM | POA: Diagnosis not present

## 2014-08-23 DIAGNOSIS — E119 Type 2 diabetes mellitus without complications: Secondary | ICD-10-CM | POA: Diagnosis not present

## 2014-08-26 ENCOUNTER — Encounter (HOSPITAL_BASED_OUTPATIENT_CLINIC_OR_DEPARTMENT_OTHER): Payer: Medicare Other | Attending: Plastic Surgery

## 2014-08-26 ENCOUNTER — Encounter (HOSPITAL_COMMUNITY)
Admission: RE | Admit: 2014-08-26 | Discharge: 2014-08-26 | Disposition: A | Discharge status: Home / Self Care | Payer: Medicare Other | Source: Ambulatory Visit | Attending: Nephrology | Admitting: Nephrology

## 2014-08-26 ENCOUNTER — Encounter (HOSPITAL_COMMUNITY): Payer: Self-pay

## 2014-08-26 DIAGNOSIS — E119 Type 2 diabetes mellitus without complications: Secondary | ICD-10-CM | POA: Diagnosis not present

## 2014-08-26 DIAGNOSIS — D638 Anemia in other chronic diseases classified elsewhere: Secondary | ICD-10-CM | POA: Insufficient documentation

## 2014-08-26 DIAGNOSIS — T8189XS Other complications of procedures, not elsewhere classified, sequela: Secondary | ICD-10-CM | POA: Diagnosis not present

## 2014-08-26 DIAGNOSIS — N183 Chronic kidney disease, stage 3 (moderate): Secondary | ICD-10-CM | POA: Insufficient documentation

## 2014-08-26 DIAGNOSIS — Y839 Surgical procedure, unspecified as the cause of abnormal reaction of the patient, or of later complication, without mention of misadventure at the time of the procedure: Secondary | ICD-10-CM | POA: Insufficient documentation

## 2014-08-26 DIAGNOSIS — S31809S Unspecified open wound of unspecified buttock, sequela: Secondary | ICD-10-CM | POA: Diagnosis not present

## 2014-08-26 LAB — HEMOGLOBIN AND HEMATOCRIT, BLOOD
HCT: 33.8 % — ABNORMAL LOW (ref 36.0–46.0)
HEMOGLOBIN: 10.7 g/dL — AB (ref 12.0–15.0)

## 2014-08-26 MED ORDER — DARBEPOETIN ALFA 100 MCG/0.5ML IJ SOSY
PREFILLED_SYRINGE | INTRAMUSCULAR | Status: AC
  Filled 2014-08-26: qty 0.5

## 2014-08-26 MED ORDER — CLONIDINE HCL 0.1 MG PO TABS
0.1000 mg | ORAL_TABLET | Freq: Once | ORAL | Status: AC
  Administered 2014-08-26: 0.1 mg via ORAL
  Filled 2014-08-26: qty 1

## 2014-08-26 MED ORDER — DARBEPOETIN ALFA 100 MCG/0.5ML IJ SOSY
100.0000 ug | PREFILLED_SYRINGE | INTRAMUSCULAR | Status: DC
  Administered 2014-08-26: 100 ug via SUBCUTANEOUS

## 2014-08-26 NOTE — Progress Notes (Signed)
Results for [name], [name] (MRN [id number]) as of 08/26/2014 10:21  Dr. Mercy  notified of admission BP of 224/80, clonidine 0.1 mg given with continual elevation of 217/78 after 30 minutes.  Orders received for pt to come back next week for BP check and possibly get her Aranesp. Then prior to arranging pt to come back next week requests BP recheck 179/61. Pt requests for the doctor to be recalled with results,  MD gives orders to go ahead with prescribed dose of Aranesp 100 mcg given as indicated in left arm. Instructed pt to continue to monitor BP daily, notify MD if BP remains over 99991111 systolic.   Ref. Range 07/22/2014 12:30 08/26/2014 10:40  Hemoglobin Latest Range: 12.0-15.0 g/dL 11.0 (L) 10.7 (L)  HCT Latest Range: 36.0-46.0 % 34.7 (L) 33.8 (L)    Pt has also brought in order for a pre-albumin level and HGB A1-C from her MD from the wound center. These labs drawn and will send when available.

## 2014-08-27 ENCOUNTER — Inpatient Hospital Stay (HOSPITAL_COMMUNITY): Admission: RE | Admit: 2014-08-27 | Payer: Self-pay | Source: Ambulatory Visit

## 2014-08-27 ENCOUNTER — Ambulatory Visit (HOSPITAL_COMMUNITY): Payer: Self-pay

## 2014-08-27 DIAGNOSIS — Z471 Aftercare following joint replacement surgery: Secondary | ICD-10-CM | POA: Diagnosis not present

## 2014-08-27 DIAGNOSIS — Z96641 Presence of right artificial hip joint: Secondary | ICD-10-CM | POA: Diagnosis not present

## 2014-08-27 DIAGNOSIS — E119 Type 2 diabetes mellitus without complications: Secondary | ICD-10-CM | POA: Diagnosis not present

## 2014-08-27 DIAGNOSIS — M6281 Muscle weakness (generalized): Secondary | ICD-10-CM | POA: Diagnosis not present

## 2014-08-27 DIAGNOSIS — I129 Hypertensive chronic kidney disease with stage 1 through stage 4 chronic kidney disease, or unspecified chronic kidney disease: Secondary | ICD-10-CM | POA: Diagnosis not present

## 2014-08-27 DIAGNOSIS — A1801 Tuberculosis of spine: Secondary | ICD-10-CM | POA: Diagnosis not present

## 2014-08-27 LAB — HEMOGLOBIN A1C
Hgb A1c MFr Bld: 8.8 % — ABNORMAL HIGH (ref 4.8–5.6)
Mean Plasma Glucose: 206 mg/dL

## 2014-08-28 DIAGNOSIS — M6281 Muscle weakness (generalized): Secondary | ICD-10-CM | POA: Diagnosis not present

## 2014-08-28 DIAGNOSIS — I129 Hypertensive chronic kidney disease with stage 1 through stage 4 chronic kidney disease, or unspecified chronic kidney disease: Secondary | ICD-10-CM | POA: Diagnosis not present

## 2014-08-28 DIAGNOSIS — N183 Chronic kidney disease, stage 3 (moderate): Secondary | ICD-10-CM | POA: Diagnosis not present

## 2014-08-28 DIAGNOSIS — A1801 Tuberculosis of spine: Secondary | ICD-10-CM | POA: Diagnosis not present

## 2014-08-28 DIAGNOSIS — I2510 Atherosclerotic heart disease of native coronary artery without angina pectoris: Secondary | ICD-10-CM | POA: Diagnosis not present

## 2014-08-28 DIAGNOSIS — E119 Type 2 diabetes mellitus without complications: Secondary | ICD-10-CM | POA: Diagnosis not present

## 2014-08-30 DIAGNOSIS — M6281 Muscle weakness (generalized): Secondary | ICD-10-CM | POA: Diagnosis not present

## 2014-08-30 DIAGNOSIS — A1801 Tuberculosis of spine: Secondary | ICD-10-CM | POA: Diagnosis not present

## 2014-08-30 DIAGNOSIS — E119 Type 2 diabetes mellitus without complications: Secondary | ICD-10-CM | POA: Diagnosis not present

## 2014-08-30 DIAGNOSIS — N183 Chronic kidney disease, stage 3 (moderate): Secondary | ICD-10-CM | POA: Diagnosis not present

## 2014-08-30 DIAGNOSIS — I2510 Atherosclerotic heart disease of native coronary artery without angina pectoris: Secondary | ICD-10-CM | POA: Diagnosis not present

## 2014-08-30 DIAGNOSIS — I129 Hypertensive chronic kidney disease with stage 1 through stage 4 chronic kidney disease, or unspecified chronic kidney disease: Secondary | ICD-10-CM | POA: Diagnosis not present

## 2014-09-02 DIAGNOSIS — S31000D Unspecified open wound of lower back and pelvis without penetration into retroperitoneum, subsequent encounter: Secondary | ICD-10-CM | POA: Diagnosis not present

## 2014-09-02 LAB — PREALBUMIN

## 2014-09-05 DIAGNOSIS — A1801 Tuberculosis of spine: Secondary | ICD-10-CM | POA: Diagnosis not present

## 2014-09-05 DIAGNOSIS — M6281 Muscle weakness (generalized): Secondary | ICD-10-CM | POA: Diagnosis not present

## 2014-09-05 DIAGNOSIS — N183 Chronic kidney disease, stage 3 (moderate): Secondary | ICD-10-CM | POA: Diagnosis not present

## 2014-09-05 DIAGNOSIS — I2510 Atherosclerotic heart disease of native coronary artery without angina pectoris: Secondary | ICD-10-CM | POA: Diagnosis not present

## 2014-09-05 DIAGNOSIS — I129 Hypertensive chronic kidney disease with stage 1 through stage 4 chronic kidney disease, or unspecified chronic kidney disease: Secondary | ICD-10-CM | POA: Diagnosis not present

## 2014-09-05 DIAGNOSIS — E119 Type 2 diabetes mellitus without complications: Secondary | ICD-10-CM | POA: Diagnosis not present

## 2014-09-09 DIAGNOSIS — N2581 Secondary hyperparathyroidism of renal origin: Secondary | ICD-10-CM | POA: Diagnosis not present

## 2014-09-09 DIAGNOSIS — D631 Anemia in chronic kidney disease: Secondary | ICD-10-CM | POA: Diagnosis not present

## 2014-09-09 DIAGNOSIS — N183 Chronic kidney disease, stage 3 (moderate): Secondary | ICD-10-CM | POA: Diagnosis not present

## 2014-09-09 DIAGNOSIS — R809 Proteinuria, unspecified: Secondary | ICD-10-CM | POA: Diagnosis not present

## 2014-09-09 DIAGNOSIS — N184 Chronic kidney disease, stage 4 (severe): Secondary | ICD-10-CM | POA: Diagnosis not present

## 2014-09-09 DIAGNOSIS — I129 Hypertensive chronic kidney disease with stage 1 through stage 4 chronic kidney disease, or unspecified chronic kidney disease: Secondary | ICD-10-CM | POA: Diagnosis not present

## 2014-09-10 DIAGNOSIS — M6281 Muscle weakness (generalized): Secondary | ICD-10-CM | POA: Diagnosis not present

## 2014-09-10 DIAGNOSIS — N183 Chronic kidney disease, stage 3 (moderate): Secondary | ICD-10-CM | POA: Diagnosis not present

## 2014-09-10 DIAGNOSIS — I2510 Atherosclerotic heart disease of native coronary artery without angina pectoris: Secondary | ICD-10-CM | POA: Diagnosis not present

## 2014-09-10 DIAGNOSIS — A1801 Tuberculosis of spine: Secondary | ICD-10-CM | POA: Diagnosis not present

## 2014-09-10 DIAGNOSIS — E119 Type 2 diabetes mellitus without complications: Secondary | ICD-10-CM | POA: Diagnosis not present

## 2014-09-10 DIAGNOSIS — I129 Hypertensive chronic kidney disease with stage 1 through stage 4 chronic kidney disease, or unspecified chronic kidney disease: Secondary | ICD-10-CM | POA: Diagnosis not present

## 2014-09-12 DIAGNOSIS — A1801 Tuberculosis of spine: Secondary | ICD-10-CM | POA: Diagnosis not present

## 2014-09-12 DIAGNOSIS — N183 Chronic kidney disease, stage 3 (moderate): Secondary | ICD-10-CM | POA: Diagnosis not present

## 2014-09-12 DIAGNOSIS — M6281 Muscle weakness (generalized): Secondary | ICD-10-CM | POA: Diagnosis not present

## 2014-09-12 DIAGNOSIS — I2510 Atherosclerotic heart disease of native coronary artery without angina pectoris: Secondary | ICD-10-CM | POA: Diagnosis not present

## 2014-09-12 DIAGNOSIS — E119 Type 2 diabetes mellitus without complications: Secondary | ICD-10-CM | POA: Diagnosis not present

## 2014-09-12 DIAGNOSIS — I129 Hypertensive chronic kidney disease with stage 1 through stage 4 chronic kidney disease, or unspecified chronic kidney disease: Secondary | ICD-10-CM | POA: Diagnosis not present

## 2014-09-13 DIAGNOSIS — I2510 Atherosclerotic heart disease of native coronary artery without angina pectoris: Secondary | ICD-10-CM | POA: Diagnosis not present

## 2014-09-13 DIAGNOSIS — M6281 Muscle weakness (generalized): Secondary | ICD-10-CM | POA: Diagnosis not present

## 2014-09-13 DIAGNOSIS — N183 Chronic kidney disease, stage 3 (moderate): Secondary | ICD-10-CM | POA: Diagnosis not present

## 2014-09-13 DIAGNOSIS — I129 Hypertensive chronic kidney disease with stage 1 through stage 4 chronic kidney disease, or unspecified chronic kidney disease: Secondary | ICD-10-CM | POA: Diagnosis not present

## 2014-09-13 DIAGNOSIS — E119 Type 2 diabetes mellitus without complications: Secondary | ICD-10-CM | POA: Diagnosis not present

## 2014-09-13 DIAGNOSIS — A1801 Tuberculosis of spine: Secondary | ICD-10-CM | POA: Diagnosis not present

## 2014-09-16 ENCOUNTER — Ambulatory Visit: Payer: Medicare Other | Admitting: Nurse Practitioner

## 2014-09-16 DIAGNOSIS — T8189XS Other complications of procedures, not elsewhere classified, sequela: Secondary | ICD-10-CM | POA: Diagnosis not present

## 2014-09-16 DIAGNOSIS — S31809S Unspecified open wound of unspecified buttock, sequela: Secondary | ICD-10-CM | POA: Diagnosis not present

## 2014-09-17 DIAGNOSIS — M6281 Muscle weakness (generalized): Secondary | ICD-10-CM | POA: Diagnosis not present

## 2014-09-17 DIAGNOSIS — E119 Type 2 diabetes mellitus without complications: Secondary | ICD-10-CM | POA: Diagnosis not present

## 2014-09-17 DIAGNOSIS — A1801 Tuberculosis of spine: Secondary | ICD-10-CM | POA: Diagnosis not present

## 2014-09-17 DIAGNOSIS — I2510 Atherosclerotic heart disease of native coronary artery without angina pectoris: Secondary | ICD-10-CM | POA: Diagnosis not present

## 2014-09-17 DIAGNOSIS — N183 Chronic kidney disease, stage 3 (moderate): Secondary | ICD-10-CM | POA: Diagnosis not present

## 2014-09-17 DIAGNOSIS — I129 Hypertensive chronic kidney disease with stage 1 through stage 4 chronic kidney disease, or unspecified chronic kidney disease: Secondary | ICD-10-CM | POA: Diagnosis not present

## 2014-09-19 DIAGNOSIS — I129 Hypertensive chronic kidney disease with stage 1 through stage 4 chronic kidney disease, or unspecified chronic kidney disease: Secondary | ICD-10-CM | POA: Diagnosis not present

## 2014-09-19 DIAGNOSIS — I2510 Atherosclerotic heart disease of native coronary artery without angina pectoris: Secondary | ICD-10-CM | POA: Diagnosis not present

## 2014-09-19 DIAGNOSIS — E119 Type 2 diabetes mellitus without complications: Secondary | ICD-10-CM | POA: Diagnosis not present

## 2014-09-19 DIAGNOSIS — N183 Chronic kidney disease, stage 3 (moderate): Secondary | ICD-10-CM | POA: Diagnosis not present

## 2014-09-19 DIAGNOSIS — M6281 Muscle weakness (generalized): Secondary | ICD-10-CM | POA: Diagnosis not present

## 2014-09-19 DIAGNOSIS — A1801 Tuberculosis of spine: Secondary | ICD-10-CM | POA: Diagnosis not present

## 2014-09-21 ENCOUNTER — Encounter (INDEPENDENT_AMBULATORY_CARE_PROVIDER_SITE_OTHER): Payer: Medicare Other | Admitting: Family Medicine

## 2014-09-21 DIAGNOSIS — A1801 Tuberculosis of spine: Secondary | ICD-10-CM | POA: Diagnosis not present

## 2014-09-21 DIAGNOSIS — E119 Type 2 diabetes mellitus without complications: Secondary | ICD-10-CM

## 2014-09-21 DIAGNOSIS — I129 Hypertensive chronic kidney disease with stage 1 through stage 4 chronic kidney disease, or unspecified chronic kidney disease: Secondary | ICD-10-CM

## 2014-09-23 DIAGNOSIS — I2510 Atherosclerotic heart disease of native coronary artery without angina pectoris: Secondary | ICD-10-CM | POA: Diagnosis not present

## 2014-09-23 DIAGNOSIS — I129 Hypertensive chronic kidney disease with stage 1 through stage 4 chronic kidney disease, or unspecified chronic kidney disease: Secondary | ICD-10-CM | POA: Diagnosis not present

## 2014-09-23 DIAGNOSIS — A1801 Tuberculosis of spine: Secondary | ICD-10-CM | POA: Diagnosis not present

## 2014-09-23 DIAGNOSIS — E119 Type 2 diabetes mellitus without complications: Secondary | ICD-10-CM | POA: Diagnosis not present

## 2014-09-23 DIAGNOSIS — M6281 Muscle weakness (generalized): Secondary | ICD-10-CM | POA: Diagnosis not present

## 2014-09-23 DIAGNOSIS — N183 Chronic kidney disease, stage 3 (moderate): Secondary | ICD-10-CM | POA: Diagnosis not present

## 2014-09-24 ENCOUNTER — Encounter (HOSPITAL_BASED_OUTPATIENT_CLINIC_OR_DEPARTMENT_OTHER): Payer: Self-pay | Admitting: *Deleted

## 2014-09-24 NOTE — Progress Notes (Signed)
NPO AFTER MN WITH EXCEPTION CLEAR LIQUIDS UNTIL 0800 (NO CREAM/ MILK PRODUCTS).  ARRIVE AT 1230.  NEEDS ISTAT.  CURRENT EKG IN CHART AND EPIC.  WILL TAKE TOPROL, COZAAR, PROTONIX, AND SYNTHROID AM DOS W/ SIPS OF WATER.

## 2014-09-25 DIAGNOSIS — I129 Hypertensive chronic kidney disease with stage 1 through stage 4 chronic kidney disease, or unspecified chronic kidney disease: Secondary | ICD-10-CM | POA: Diagnosis not present

## 2014-09-25 DIAGNOSIS — A1801 Tuberculosis of spine: Secondary | ICD-10-CM | POA: Diagnosis not present

## 2014-09-25 DIAGNOSIS — E119 Type 2 diabetes mellitus without complications: Secondary | ICD-10-CM | POA: Diagnosis not present

## 2014-09-25 DIAGNOSIS — I2510 Atherosclerotic heart disease of native coronary artery without angina pectoris: Secondary | ICD-10-CM | POA: Diagnosis not present

## 2014-09-25 DIAGNOSIS — M6281 Muscle weakness (generalized): Secondary | ICD-10-CM | POA: Diagnosis not present

## 2014-09-25 DIAGNOSIS — N183 Chronic kidney disease, stage 3 (moderate): Secondary | ICD-10-CM | POA: Diagnosis not present

## 2014-09-26 ENCOUNTER — Ambulatory Visit (INDEPENDENT_AMBULATORY_CARE_PROVIDER_SITE_OTHER): Payer: Medicare Other | Admitting: Orthopedic Surgery

## 2014-09-26 ENCOUNTER — Encounter: Payer: Self-pay | Admitting: Orthopedic Surgery

## 2014-09-26 ENCOUNTER — Ambulatory Visit (INDEPENDENT_AMBULATORY_CARE_PROVIDER_SITE_OTHER): Payer: Medicare Other

## 2014-09-26 VITALS — BP 204/86 | Ht 62.0 in | Wt 170.0 lb

## 2014-09-26 DIAGNOSIS — S72142D Displaced intertrochanteric fracture of left femur, subsequent encounter for closed fracture with routine healing: Secondary | ICD-10-CM

## 2014-09-26 NOTE — Progress Notes (Signed)
Patient ID: [name], female   DOB: [birth year], 69 y.o.   MRN: [id number] Chief Complaint  Patient presents with  . Follow-up    6 week recheck on right hip with xray, DOS 05-18-14.   Encounter Diagnosis  Name Primary?  . Intertrochanteric fracture of left hip, closed, with routine healing, subsequent encounter Yes    Patient comes in for x-ray proximally for half months post dynamic compression hip screw for an atrophic fracture with really no complaints other than she wants to get off of the walker and walk without any devices  She does have some peripheral edema because she didn't take her fluid pill this morning because she was coming to the doctor and in one have to go to the bathroom frequently  She does have a abscess on her sacral region which will be treated in [place] later this month  Hip flexion is good no pain with range of motion. Leg lengths are equal she has good rotational alignment. Her x-ray shows fracture healing with sliding of the compression screw has designed  Follow-up in 6 weeks or so for reevaluation of her ambulatory status status post physical therapy to at least try to get her using a cane

## 2014-09-26 NOTE — Patient Instructions (Signed)
[place] [facility]

## 2014-09-26 NOTE — Addendum Note (Signed)
Addended by: Baldomero Lamy B on: 09/26/2014 05:09 PM   Modules accepted: Orders

## 2014-09-27 ENCOUNTER — Other Ambulatory Visit: Payer: Self-pay | Admitting: Plastic Surgery

## 2014-09-27 DIAGNOSIS — M6281 Muscle weakness (generalized): Secondary | ICD-10-CM | POA: Diagnosis not present

## 2014-09-27 DIAGNOSIS — N183 Chronic kidney disease, stage 3 (moderate): Secondary | ICD-10-CM | POA: Diagnosis not present

## 2014-09-27 DIAGNOSIS — E119 Type 2 diabetes mellitus without complications: Secondary | ICD-10-CM | POA: Diagnosis not present

## 2014-09-27 DIAGNOSIS — L89153 Pressure ulcer of sacral region, stage 3: Secondary | ICD-10-CM

## 2014-09-27 DIAGNOSIS — I2510 Atherosclerotic heart disease of native coronary artery without angina pectoris: Secondary | ICD-10-CM | POA: Diagnosis not present

## 2014-09-27 DIAGNOSIS — I129 Hypertensive chronic kidney disease with stage 1 through stage 4 chronic kidney disease, or unspecified chronic kidney disease: Secondary | ICD-10-CM | POA: Diagnosis not present

## 2014-09-27 DIAGNOSIS — A1801 Tuberculosis of spine: Secondary | ICD-10-CM | POA: Diagnosis not present

## 2014-09-30 ENCOUNTER — Ambulatory Visit (HOSPITAL_BASED_OUTPATIENT_CLINIC_OR_DEPARTMENT_OTHER): Payer: Medicare Other | Admitting: Anesthesiology

## 2014-09-30 ENCOUNTER — Encounter (HOSPITAL_BASED_OUTPATIENT_CLINIC_OR_DEPARTMENT_OTHER): Payer: Self-pay | Admitting: *Deleted

## 2014-09-30 ENCOUNTER — Encounter (HOSPITAL_BASED_OUTPATIENT_CLINIC_OR_DEPARTMENT_OTHER)
Admission: RE | Disposition: A | Discharge status: Home / Self Care | Payer: Self-pay | Source: Ambulatory Visit | Attending: Plastic Surgery

## 2014-09-30 ENCOUNTER — Ambulatory Visit (HOSPITAL_BASED_OUTPATIENT_CLINIC_OR_DEPARTMENT_OTHER)
Admission: RE | Admit: 2014-09-30 | Discharge: 2014-09-30 | Disposition: A | Discharge status: Home / Self Care | Payer: Medicare Other | Source: Ambulatory Visit | Attending: Plastic Surgery | Admitting: Plastic Surgery

## 2014-09-30 ENCOUNTER — Encounter (HOSPITAL_BASED_OUTPATIENT_CLINIC_OR_DEPARTMENT_OTHER): Payer: Medicare Other | Attending: Plastic Surgery

## 2014-09-30 DIAGNOSIS — S31809D Unspecified open wound of unspecified buttock, subsequent encounter: Secondary | ICD-10-CM | POA: Insufficient documentation

## 2014-09-30 DIAGNOSIS — K5730 Diverticulosis of large intestine without perforation or abscess without bleeding: Secondary | ICD-10-CM | POA: Insufficient documentation

## 2014-09-30 DIAGNOSIS — F411 Generalized anxiety disorder: Secondary | ICD-10-CM | POA: Insufficient documentation

## 2014-09-30 DIAGNOSIS — I4510 Unspecified right bundle-branch block: Secondary | ICD-10-CM | POA: Insufficient documentation

## 2014-09-30 DIAGNOSIS — Z86010 Personal history of colonic polyps: Secondary | ICD-10-CM | POA: Diagnosis not present

## 2014-09-30 DIAGNOSIS — I2510 Atherosclerotic heart disease of native coronary artery without angina pectoris: Secondary | ICD-10-CM | POA: Diagnosis not present

## 2014-09-30 DIAGNOSIS — Z881 Allergy status to other antibiotic agents status: Secondary | ICD-10-CM | POA: Insufficient documentation

## 2014-09-30 DIAGNOSIS — K226 Gastro-esophageal laceration-hemorrhage syndrome: Secondary | ICD-10-CM | POA: Insufficient documentation

## 2014-09-30 DIAGNOSIS — D631 Anemia in chronic kidney disease: Secondary | ICD-10-CM | POA: Insufficient documentation

## 2014-09-30 DIAGNOSIS — N183 Chronic kidney disease, stage 3 (moderate): Secondary | ICD-10-CM | POA: Diagnosis not present

## 2014-09-30 DIAGNOSIS — E11319 Type 2 diabetes mellitus with unspecified diabetic retinopathy without macular edema: Secondary | ICD-10-CM | POA: Diagnosis not present

## 2014-09-30 DIAGNOSIS — Y839 Surgical procedure, unspecified as the cause of abnormal reaction of the patient, or of later complication, without mention of misadventure at the time of the procedure: Secondary | ICD-10-CM | POA: Insufficient documentation

## 2014-09-30 DIAGNOSIS — I129 Hypertensive chronic kidney disease with stage 1 through stage 4 chronic kidney disease, or unspecified chronic kidney disease: Secondary | ICD-10-CM | POA: Diagnosis not present

## 2014-09-30 DIAGNOSIS — Z888 Allergy status to other drugs, medicaments and biological substances status: Secondary | ICD-10-CM | POA: Diagnosis not present

## 2014-09-30 DIAGNOSIS — L89159 Pressure ulcer of sacral region, unspecified stage: Secondary | ICD-10-CM | POA: Diagnosis not present

## 2014-09-30 DIAGNOSIS — K626 Ulcer of anus and rectum: Secondary | ICD-10-CM | POA: Diagnosis not present

## 2014-09-30 DIAGNOSIS — M109 Gout, unspecified: Secondary | ICD-10-CM | POA: Diagnosis not present

## 2014-09-30 DIAGNOSIS — K219 Gastro-esophageal reflux disease without esophagitis: Secondary | ICD-10-CM | POA: Diagnosis not present

## 2014-09-30 DIAGNOSIS — Z886 Allergy status to analgesic agent status: Secondary | ICD-10-CM | POA: Diagnosis not present

## 2014-09-30 DIAGNOSIS — E785 Hyperlipidemia, unspecified: Secondary | ICD-10-CM | POA: Insufficient documentation

## 2014-09-30 DIAGNOSIS — L98499 Non-pressure chronic ulcer of skin of other sites with unspecified severity: Secondary | ICD-10-CM | POA: Diagnosis not present

## 2014-09-30 DIAGNOSIS — K3184 Gastroparesis: Secondary | ICD-10-CM | POA: Diagnosis not present

## 2014-09-30 DIAGNOSIS — E1143 Type 2 diabetes mellitus with diabetic autonomic (poly)neuropathy: Secondary | ICD-10-CM | POA: Insufficient documentation

## 2014-09-30 DIAGNOSIS — E039 Hypothyroidism, unspecified: Secondary | ICD-10-CM | POA: Diagnosis not present

## 2014-09-30 DIAGNOSIS — L89153 Pressure ulcer of sacral region, stage 3: Secondary | ICD-10-CM

## 2014-09-30 DIAGNOSIS — T8189XD Other complications of procedures, not elsewhere classified, subsequent encounter: Secondary | ICD-10-CM | POA: Insufficient documentation

## 2014-09-30 HISTORY — DX: Type 2 diabetes mellitus with diabetic autonomic (poly)neuropathy: K31.84

## 2014-09-30 HISTORY — DX: Anemia in chronic kidney disease: N18.9

## 2014-09-30 HISTORY — DX: Type 2 diabetes mellitus with unspecified diabetic retinopathy without macular edema: E11.319

## 2014-09-30 HISTORY — DX: Anemia in chronic kidney disease: D63.1

## 2014-09-30 HISTORY — DX: Other complications of anesthesia, initial encounter: T88.59XA

## 2014-09-30 HISTORY — DX: Adverse effect of unspecified anesthetic, initial encounter: T41.45XA

## 2014-09-30 HISTORY — DX: Personal history of colon polyps, unspecified: Z86.0100

## 2014-09-30 HISTORY — DX: Left anterior fascicular block: I44.4

## 2014-09-30 HISTORY — DX: Diverticulosis of large intestine without perforation or abscess without bleeding: K57.30

## 2014-09-30 HISTORY — DX: Personal history of other diseases of the digestive system: Z87.19

## 2014-09-30 HISTORY — DX: Pressure ulcer of sacral region, unspecified stage: L89.159

## 2014-09-30 HISTORY — PX: INCISION AND DRAINAGE OF WOUND: SHX1803

## 2014-09-30 HISTORY — DX: Type 2 diabetes mellitus with diabetic autonomic (poly)neuropathy: E11.43

## 2014-09-30 HISTORY — DX: Personal history of colonic polyps: Z86.010

## 2014-09-30 HISTORY — DX: Unspecified right bundle-branch block: I45.10

## 2014-09-30 LAB — POCT I-STAT 4, (NA,K, GLUC, HGB,HCT)
GLUCOSE: 292 mg/dL — AB (ref 70–99)
Glucose, Bld: 298 mg/dL — ABNORMAL HIGH (ref 70–99)
HEMATOCRIT: 31 % — AB (ref 36.0–46.0)
HEMATOCRIT: 33 % — AB (ref 36.0–46.0)
Hemoglobin: 10.5 g/dL — ABNORMAL LOW (ref 12.0–15.0)
Hemoglobin: 11.2 g/dL — ABNORMAL LOW (ref 12.0–15.0)
Potassium: 2.6 mmol/L — CL (ref 3.5–5.1)
Potassium: 3 mmol/L — ABNORMAL LOW (ref 3.5–5.1)
SODIUM: 142 mmol/L (ref 135–145)
Sodium: 141 mmol/L (ref 135–145)

## 2014-09-30 LAB — GLUCOSE, CAPILLARY: GLUCOSE-CAPILLARY: 155 mg/dL — AB (ref 70–99)

## 2014-09-30 SURGERY — IRRIGATION AND DEBRIDEMENT WOUND
Anesthesia: General | Site: Coccyx

## 2014-09-30 MED ORDER — LIDOCAINE-EPINEPHRINE 1 %-1:100000 IJ SOLN
INTRAMUSCULAR | Status: DC | PRN
  Administered 2014-09-30: 5 mL

## 2014-09-30 MED ORDER — CEFAZOLIN SODIUM-DEXTROSE 2-3 GM-% IV SOLR
INTRAVENOUS | Status: AC
  Filled 2014-09-30: qty 50

## 2014-09-30 MED ORDER — EPHEDRINE SULFATE 50 MG/ML IJ SOLN
INTRAMUSCULAR | Status: DC | PRN
  Administered 2014-09-30 (×2): 10 mg via INTRAVENOUS

## 2014-09-30 MED ORDER — HYDROMORPHONE HCL 1 MG/ML IJ SOLN
0.2500 mg | INTRAMUSCULAR | Status: DC | PRN
  Filled 2014-09-30: qty 1

## 2014-09-30 MED ORDER — CEFAZOLIN SODIUM-DEXTROSE 2-3 GM-% IV SOLR
2.0000 g | INTRAVENOUS | Status: AC
  Administered 2014-09-30: 2 g via INTRAVENOUS
  Filled 2014-09-30: qty 50

## 2014-09-30 MED ORDER — FENTANYL CITRATE (PF) 100 MCG/2ML IJ SOLN
INTRAMUSCULAR | Status: AC
  Filled 2014-09-30: qty 6

## 2014-09-30 MED ORDER — FENTANYL CITRATE (PF) 100 MCG/2ML IJ SOLN
INTRAMUSCULAR | Status: DC | PRN
  Administered 2014-09-30 (×2): 25 ug via INTRAVENOUS
  Administered 2014-09-30: 50 ug via INTRAVENOUS

## 2014-09-30 MED ORDER — DEXAMETHASONE SODIUM PHOSPHATE 4 MG/ML IJ SOLN
INTRAMUSCULAR | Status: DC | PRN
  Administered 2014-09-30: 8 mg via INTRAVENOUS

## 2014-09-30 MED ORDER — SODIUM CHLORIDE 0.9 % IR SOLN
Status: DC | PRN
  Administered 2014-09-30: 500 mL

## 2014-09-30 MED ORDER — SUCCINYLCHOLINE CHLORIDE 20 MG/ML IJ SOLN
INTRAMUSCULAR | Status: DC | PRN
  Administered 2014-09-30: 100 mg via INTRAVENOUS

## 2014-09-30 MED ORDER — LIDOCAINE HCL (CARDIAC) 20 MG/ML IV SOLN
INTRAVENOUS | Status: DC | PRN
  Administered 2014-09-30: 60 mg via INTRAVENOUS

## 2014-09-30 MED ORDER — PROPOFOL 10 MG/ML IV BOLUS
INTRAVENOUS | Status: DC | PRN
  Administered 2014-09-30: 130 mg via INTRAVENOUS

## 2014-09-30 MED ORDER — PROMETHAZINE HCL 25 MG/ML IJ SOLN
6.2500 mg | INTRAMUSCULAR | Status: DC | PRN
  Filled 2014-09-30: qty 1

## 2014-09-30 MED ORDER — HYDROCODONE-ACETAMINOPHEN 7.5-325 MG PO TABS
1.0000 | ORAL_TABLET | Freq: Once | ORAL | Status: DC | PRN
  Filled 2014-09-30: qty 1

## 2014-09-30 MED ORDER — MIDAZOLAM HCL 2 MG/2ML IJ SOLN
INTRAMUSCULAR | Status: AC
  Filled 2014-09-30: qty 2

## 2014-09-30 MED ORDER — METHYLENE BLUE 1 % INJ SOLN
INTRAMUSCULAR | Status: DC | PRN
  Administered 2014-09-30: 1 mL via SUBMUCOSAL

## 2014-09-30 MED ORDER — SODIUM CHLORIDE 0.9 % IV SOLN
INTRAVENOUS | Status: DC
Start: 2014-09-30 — End: 2014-09-30
  Administered 2014-09-30: 12:00:00 via INTRAVENOUS
  Filled 2014-09-30: qty 1000

## 2014-09-30 MED ORDER — INSULIN ASPART 100 UNIT/ML ~~LOC~~ SOLN
6.0000 [IU] | Freq: Once | SUBCUTANEOUS | Status: AC
  Administered 2014-09-30: 6 [IU] via SUBCUTANEOUS
  Filled 2014-09-30: qty 0.06

## 2014-09-30 SURGICAL SUPPLY — 42 items
BLADE SURG 10 STRL SS (BLADE) ×3 IMPLANT
BLADE SURG 15 STRL LF DISP TIS (BLADE) ×1 IMPLANT
BLADE SURG 15 STRL SS (BLADE) ×2
BNDG GAUZE ELAST 4 BULKY (GAUZE/BANDAGES/DRESSINGS) IMPLANT
CANISTER SUCTION 2500CC (MISCELLANEOUS) ×3 IMPLANT
COVER BACK TABLE 60X90IN (DRAPES) ×3 IMPLANT
COVER MAYO STAND STRL (DRAPES) ×3 IMPLANT
DRAPE INCISE IOBAN 66X45 STRL (DRAPES) ×3 IMPLANT
DRAPE LG THREE QUARTER DISP (DRAPES) ×3 IMPLANT
DRAPE PED LAPAROTOMY (DRAPES) ×3 IMPLANT
DRSG ADAPTIC 3X8 NADH LF (GAUZE/BANDAGES/DRESSINGS) ×3 IMPLANT
DRSG PAD ABDOMINAL 8X10 ST (GAUZE/BANDAGES/DRESSINGS) ×3 IMPLANT
ELECT REM PT RETURN 9FT ADLT (ELECTROSURGICAL) ×3
ELECTRODE REM PT RTRN 9FT ADLT (ELECTROSURGICAL) ×1 IMPLANT
GLOVE BIO SURGEON STRL SZ 6.5 (GLOVE) ×6 IMPLANT
GLOVE BIO SURGEONS STRL SZ 6.5 (GLOVE) ×3
GOWN STRL REUS W/ TWL LRG LVL3 (GOWN DISPOSABLE) ×3 IMPLANT
GOWN STRL REUS W/TWL LRG LVL3 (GOWN DISPOSABLE) ×6
HANDPIECE INTERPULSE COAX TIP (DISPOSABLE) ×2
MATRIX SURGICAL PSM 7X10CM (Tissue) ×3 IMPLANT
MICROMATRIX 500MG (Tissue) ×3 IMPLANT
NEEDLE 27GAX1X1/2 (NEEDLE) ×3 IMPLANT
PACK BASIN DAY SURGERY FS (CUSTOM PROCEDURE TRAY) ×3 IMPLANT
PAD ABD 8X10 STRL (GAUZE/BANDAGES/DRESSINGS) IMPLANT
PENCIL BUTTON HOLSTER BLD 10FT (ELECTRODE) ×3 IMPLANT
SET HNDPC FAN SPRY TIP SCT (DISPOSABLE) ×1 IMPLANT
SLEEVE SCD COMPRESS KNEE MED (MISCELLANEOUS) ×3 IMPLANT
SOLUTION PARTIC MCRMTRX 500MG (Tissue) ×1 IMPLANT
SPONGE GAUZE 4X4 12PLY STER LF (GAUZE/BANDAGES/DRESSINGS) ×3 IMPLANT
SPONGE LAP 4X18 X RAY DECT (DISPOSABLE) ×3 IMPLANT
SURGILUBE 2OZ TUBE FLIPTOP (MISCELLANEOUS) ×3 IMPLANT
SUT ETHILON 3 0 PS 1 (SUTURE) ×3 IMPLANT
SYR BULB IRRIGATION 50ML (SYRINGE) ×3 IMPLANT
SYR CONTROL 10ML LL (SYRINGE) ×3 IMPLANT
TAPE HYPAFIX 6 X30' (GAUZE/BANDAGES/DRESSINGS) ×1
TAPE HYPAFIX 6X30 (GAUZE/BANDAGES/DRESSINGS) ×2 IMPLANT
TOWEL OR 17X24 6PK STRL BLUE (TOWEL DISPOSABLE) ×9 IMPLANT
TRAY DSU PREP LF (CUSTOM PROCEDURE TRAY) ×3 IMPLANT
TUBE CONNECTING 12'X1/4 (SUCTIONS) ×1
TUBE CONNECTING 12X1/4 (SUCTIONS) ×2 IMPLANT
UNDERPAD 30X30 INCONTINENT (UNDERPADS AND DIAPERS) ×3 IMPLANT
YANKAUER SUCT BULB TIP NO VENT (SUCTIONS) ×3 IMPLANT

## 2014-09-30 NOTE — Transfer of Care (Signed)
Immediate Anesthesia Transfer of Care Note  Patient: [name]  Procedure(s) Performed: Procedure(s) (LRB): IRRIGATION AND DEBRIDEMENT SACRAL WOUND, EXCISION OF PERIRECTAL TRACT WITH PLACEMENT OF ACCELL (N/A)  Patient Location: PACU  Anesthesia Type: General  Level of Consciousness: awake, oriented, sedated and patient cooperative  Airway & Oxygen Therapy: Patient Spontanous Breathing and Patient connected to face mask oxygen  Post-op Assessment: Report given to PACU RN and Post -op Vital signs reviewed and stable  Post vital signs: Reviewed and stable  Complications: No apparent anesthesia complications  Last Vitals:  Filed Vitals:   09/30/14 1415  BP: 174/57  Pulse: 73  Temp:   Resp: 18

## 2014-09-30 NOTE — H&P (Signed)
[name] is an 69 y.o. female.   Chief Complaint: sacral ulcer HPI: The patient is a 69 yrs old wf here with family for treatment of a sacral ulcer.  She has been dealing with this for several years.  A flap has been discussed but the patient wants to try conservative treatment if possible.  Past Medical History  Diagnosis Date  . Hypertension   . Hyperlipidemia   . GERD (gastroesophageal reflux disease)   . GAD (generalized anxiety disorder)   . Hypothyroidism   . Type II diabetes mellitus   . Arthritis     knees and hand/fingers  . Right bundle branch block   . LAFB (left anterior fascicular block)   . History of rectal abscess     12-29-2004  bedside I & D  . History of GI bleed     upper 2009  due to esophagitis  &  2002  due to Mallory-Weiss tear  . History of colon polyps     benign  . Diverticulosis of colon   . History of esophagitis   . Diabetic gastroparesis   . History of gout     in issues for several years  . Sacral decubitus ulcer     since 2014  . History of Mallory-Weiss syndrome     12/ 2002--  resolved  . Coronary artery disease   . Complication of anesthesia     post-op confusion   . CKD (chronic kidney disease), stage III     nephrologist--  dr Mercy Moore  . Diabetic retinopathy     bilateral --  monitored by dr [name]  . Anemia in chronic renal disease     Aranesp injection --  when Hg <11    Past Surgical History  Procedure Laterality Date  . Retinopathy surgery Bilateral 1980's?  . Orif femur fracture Left 10/09/2012    Procedure: OPEN REDUCTION INTERNAL FIXATION (ORIF) DISTAL FEMUR FRACTURE;  Surgeon: Rozanna Box, MD;  Location: [facility];  [name]: Orthopedics;  Laterality: Left;  . Compression hip screw Right 05/18/2014    Procedure: COMPRESSION HIP;  Surgeon: Carole Civil, MD;  Location: [facility];  [name]: Orthopedics;  Laterality: Right;  . Esophagogastroduodenoscopy  last one 01-09-2011  . Transthoracic echocardiogram   02-18-2011    mild LVH,  ef 55-60%,  grade I diastolic dysfunction/  mild TR/  RV systolic pressure increased consistant with moderate pulmonary hypertension  . Cardiovascular stress test  12-30-2004    normal perfusion study/  no ischemia or infartion/  normal LV wall function and wall motion , ef 66%  . Colonoscopy w/ polypectomy  last one 2008  . Cataract extraction w/ intraocular lens  implant, bilateral  1995    Family History  Problem Relation Age of Onset  . Colon cancer Father   . Prostate cancer Father   . Diabetes Father   . Coronary artery disease Mother 34  . Heart disease Mother   . Diabetes Mother   . Coronary artery disease Sister 84  . Diabetes Sister   . Heart disease Sister   . Diabetes Maternal Grandmother   . Diabetes Sister   . Diabetes Sister   . Diabetes Sister   . Diabetes Sister   . Diabetes Sister    Social History:  reports that she has never smoked. She has never used smokeless tobacco. She reports that she does not drink alcohol or use illicit drugs.  Allergies:  Allergies  Allergen Reactions  .  Aspirin Nausea And Vomiting  . Ciprofloxacin Other (See Comments)    Upset Stomach  . Codeine Nausea And Vomiting    Makes me sick   . Micardis [Telmisartan] Other (See Comments)    unknown  . Rofecoxib Other (See Comments)    unknown    Medications Prior to Admission  Medication Sig Dispense Refill  . atorvastatin (LIPITOR) 40 MG tablet Take 1 tablet (40 mg total) by mouth daily. (Patient taking differently: Take 40 mg by mouth every evening. ) 90 tablet 1  . calcitRIOL (ROCALTROL) 0.25 MCG capsule Take 1 capsule (0.25 mcg total) by mouth daily. (Patient taking differently: Take 0.25 mcg by mouth every morning. ) 90 capsule 1  . darbepoetin (ARANESP) 100 MCG/0.5ML SOLN injection Inject 100 mcg into the skin every 5 (five) weeks. LAST INJECTION MARCH 2016--  When Hgb is <11    . furosemide (LASIX) 40 MG tablet Take 1 tablet (40 mg total) by mouth  daily. (Patient taking differently: Take 40 mg by mouth every morning. ) 90 tablet 1  . glimepiride (AMARYL) 4 MG tablet 1 po qam and 1/2 in afternoon (Patient taking differently: Take 4 mg by mouth every evening. 1 po qam and 1/2 in afternoon) 135 tablet 1  . levothyroxine (SYNTHROID, LEVOTHROID) 75 MCG tablet Take 1 tablet (75 mcg total) by mouth daily before breakfast. 90 tablet 1  . losartan (COZAAR) 100 MG tablet Take 1 tablet (100 mg total) by mouth daily. (Patient taking differently: Take 100 mg by mouth every morning. ) 90 tablet 1  . metoprolol succinate (TOPROL-XL) 50 MG 24 hr tablet Take 1 tablet (50 mg total) by mouth daily. (Patient taking differently: Take 50 mg by mouth every morning. ) 90 tablet 1  . pantoprazole (PROTONIX) 40 MG tablet Take 1 tablet (40 mg total) by mouth 2 (two) times daily. 180 tablet 1  . hydrALAZINE (APRESOLINE) 50 MG tablet Take 1 tablet (50 mg total) by mouth every 8 (eight) hours. (Patient taking differently: Take 50 mg by mouth 3 (three) times daily. ) 90 tablet 0    Results for orders placed or performed during the hospital encounter of 09/30/14 (from the past 48 hour(s))  I-STAT 4, (NA,K, GLUC, HGB,HCT)     Status: Abnormal   Collection Time: 09/30/14 11:16 AM  Result Value Ref Range   Sodium 142 135 - 145 mmol/L   Potassium 2.6 (LL) 3.5 - 5.1 mmol/L   Glucose, Bld 298 (H) 70 - 99 mg/dL   HCT 31.0 (L) 36.0 - 46.0 %   Hemoglobin 10.5 (L) 12.0 - 15.0 g/dL   Comment MD NOTIFIED, REPEAT TEST   I-STAT 4, (NA,K, GLUC, HGB,HCT)     Status: Abnormal   Collection Time: 09/30/14 12:03 PM  Result Value Ref Range   Sodium 141 135 - 145 mmol/L   Potassium 3.0 (L) 3.5 - 5.1 mmol/L   Glucose, Bld 292 (H) 70 - 99 mg/dL   HCT 33.0 (L) 36.0 - 46.0 %   Hemoglobin 11.2 (L) 12.0 - 15.0 g/dL   No results found.  Review of Systems  Constitutional: Negative.   HENT: Negative.   Eyes: Negative.   Respiratory: Negative.   Cardiovascular: Negative.    Gastrointestinal: Negative.   Genitourinary: Negative.   Musculoskeletal: Negative.   Skin: Negative.   Psychiatric/Behavioral: Negative.     Blood pressure 243/82, pulse 81, temperature 98.6 F (37 C), temperature source Oral, resp. rate 16, height 5\' 2"  (1.575 m), weight 82.101 kg (  181 lb), SpO2 98 %. Physical Exam  Constitutional: She is oriented to person, place, and time. She appears well-developed.  HENT:  Head: Normocephalic and atraumatic.  Eyes: Conjunctivae are normal. Pupils are equal, round, and reactive to light.  Respiratory: Effort normal.  GI: Soft.  Neurological: She is alert and oriented to person, place, and time.  Psychiatric: She has a normal mood and affect. Her behavior is normal. Judgment and thought content normal.     Assessment/Plan Plan for debridement of sacral ulcer with possible acell placement.  SANGER,CLAIRE 09/30/2014, 12:18 PM

## 2014-09-30 NOTE — Discharge Instructions (Addendum)
KY gel to outer area of ulcer daily. Keep dry Post Anesthesia Home Care Instructions  Activity: Get plenty of rest for the remainder of the day. A responsible adult should stay with you for 24 hours following the procedure.  For the next 24 hours, DO NOT: -Drive a car -Paediatric nurse -Drink alcoholic beverages -Take any medication unless instructed by your physician -Make any legal decisions or sign important papers.  Meals: Start with liquid foods such as gelatin or soup. Progress to regular foods as tolerated. Avoid greasy, spicy, heavy foods. If nausea and/or vomiting occur, drink only clear liquids until the nausea and/or vomiting subsides. Call your physician if vomiting continues.  Special Instructions/Symptoms: Your throat may feel dry or sore from the anesthesia or the breathing tube placed in your throat during surgery. If this causes discomfort, gargle with warm salt water. The discomfort should disappear within 24 hours.  If you had a scopolamine patch placed behind your ear for the management of post- operative nausea and/or vomiting:  1. The medication in the patch is effective for 72 hours, after which it should be removed.  Wrap patch in a tissue and discard in the trash. Wash hands thoroughly with soap and water. 2. You may remove the patch earlier than 72 hours if you experience unpleasant side effects which may include dry mouth, dizziness or visual disturbances. 3. Avoid touching the patch. Wash your hands with soap and water after contact with the patch.   Call your surgeon if you experience:   1.  Fever over 101.0. 2.  Inability to urinate. 3.  Nausea and/or vomiting. 4.  Extreme swelling or bruising at the surgical site. 5.  Continued bleeding from the incision. 6.  Increased pain, redness or drainage from the incision. 7.  Problems related to your pain medication. 8. Any problems or concerns.   9. Any problems and/or concerns

## 2014-09-30 NOTE — Progress Notes (Signed)
Dr. Migdalia Dk called to clarify dressing change for husband, per Dr. Migdalia Dk to change dressing daily and prn if it gets wet. Instructed to apply KY gel to gauze and apply to wound area and tape to sacrum.

## 2014-09-30 NOTE — Transfer of Care (Deleted)
Immediate Anesthesia Transfer of Care Note  Patient: [name]  Procedure(s) Performed: Procedure(s) (LRB): IRRIGATION AND DEBRIDEMENT SACRAL WOUND, EXCISION OF PERIRECTAL TRACT WITH PLACEMENT OF ACCELL (N/A)  Patient Location: PACU  Anesthesia Type: General  Level of Consciousness: awake, oriented, sedated and patient cooperative  Airway & Oxygen Therapy: Patient Spontanous Breathing and Patient connected to face mask oxygen  Post-op Assessment: Report given to PACU RN and Post -op Vital signs reviewed and stable  Post vital signs: Reviewed and stable  Complications: No apparent anesthesia complications

## 2014-09-30 NOTE — Brief Op Note (Signed)
09/30/2014  1:38 PM  PATIENT:  [name]  69 y.o. female  PRE-OPERATIVE DIAGNOSIS:  SACRAL ULCER  POST-OPERATIVE DIAGNOSIS:  SACRAL ULCER  PROCEDURE:  Procedure(s): IRRIGATION AND DEBRIDEMENT SACRAL WOUND, EXCISION OF PERIRECTAL TRACT WITH PLACEMENT OF ACCELL (N/A)  SURGEON:  Surgeon(s) and Role:    * Colena Ketterman Sanger, DO - Primary  PHYSICIAN ASSISTANT: Shawn Rayburn, PA  ASSISTANTS: none   ANESTHESIA:   general  EBL:  Total I/O In: 200 [I.V.:200] Out: -   BLOOD ADMINISTERED:none  DRAINS: none   LOCAL MEDICATIONS USED:  LIDOCAINE   SPECIMEN:  Source of Specimen:  perirectal tract  DISPOSITION OF SPECIMEN:  PATHOLOGY  COUNTS:  YES  TOURNIQUET:  * No tourniquets in log *  DICTATION: .Dragon Dictation  PLAN OF CARE: Discharge to home after PACU  PATIENT DISPOSITION:  PACU - hemodynamically stable.   Delay start of Pharmacological VTE agent (>24hrs) due to surgical blood loss or risk of bleeding: no

## 2014-09-30 NOTE — Op Note (Signed)
Operative Note   DATE OF OPERATION: 09/30/2014  LOCATION: [facility]   SURGICAL DIVISION: Plastic Surgery  PREOPERATIVE DIAGNOSES:  Sacral ulcer   POSTOPERATIVE DIAGNOSES:  same  PROCEDURE:  Excision of perirectal tract 2 x 6 cm, preparation of sacral ulcer for placement of Acell (powder 500 mg and sheet 7 x 10 cm)  SURGEON: Theodoro Kos, DO  ASSISTANT: Shawn Rayburn, PA  ANESTHESIA:  General.   COMPLICATIONS: None.   INDICATIONS FOR PROCEDURE:  The patient, [name] is a 69 y.o. female born on [birth year], is here for treatment of a chronic sacral ulcer. MRN: [id number]  CONSENT:  Informed consent was obtained directly from the patient. Risks, benefits and alternatives were fully discussed. Specific risks including but not limited to bleeding, infection, hematoma, seroma, scarring, pain, infection, contracture, asymmetry, wound healing problems, and need for further surgery were all discussed. The patient did have an ample opportunity to have questions answered to satisfaction.   DESCRIPTION OF PROCEDURE:  The patient was taken to the operating room. SCDs were placed and IV antibiotics were given. The patient's operative site was prepped and draped in a sterile fashion. A time out was performed and all information was confirmed to be correct.  General anesthesia was administered.  The methalene blue was placed in the tract to visualize the depth.  The local area was injected with local for intraoperative hemostasis and postoperative pain control.  The #10 blade and bovie were used to dissect around the area with a probe in the tract.  It was 6 cm long and 2 cm wide and was excised.  A finger was placed in the rectum at the time to evaluate any connection to the rectal lumen.  There was none seen or palpated.  The area was irrigated with antibiotic solution.  All of the Acell powder and sheet were placed in the area.  KY gel was applied. The patient tolerated  the procedure well.  There were no complications. The patient was allowed to wake from anesthesia, extubated and taken to the recovery room in satisfactory condition.  The specimen was sent to pathology.

## 2014-09-30 NOTE — Anesthesia Procedure Notes (Signed)
Procedure Name: Intubation Date/Time: 09/30/2014 12:37 PM Performed by: Denna Haggard D Pre-anesthesia Checklist: Patient identified, Emergency Drugs available, Suction available and Patient being monitored Patient Re-evaluated:Patient Re-evaluated prior to inductionOxygen Delivery Method: Circle System Utilized Preoxygenation: Pre-oxygenation with 100% oxygen Intubation Type: IV induction Ventilation: Mask ventilation without difficulty Laryngoscope Size: Mac and 4 Grade View: Grade I Tube type: Oral Number of attempts: 1 Airway Equipment and Method: Stylet and Oral airway Placement Confirmation: ETT inserted through vocal cords under direct vision,  positive ETCO2 and breath sounds checked- equal and bilateral Tube secured with: Tape Dental Injury: Teeth and Oropharynx as per pre-operative assessment

## 2014-09-30 NOTE — Anesthesia Preprocedure Evaluation (Addendum)
Anesthesia Evaluation  Patient identified by MRN, date of birth, ID band Patient awake    Reviewed: Allergy & Precautions, H&P , NPO status , Patient's Chart, lab work & pertinent test results, reviewed documented beta blocker date and time   History of Anesthesia Complications (+) DIFFICULT IV STICK / SPECIAL LINENegative for: history of anesthetic complications  Airway Mallampati: III  TM Distance: >3 FB Neck ROM: Limited    Dental  (+) Poor Dentition, Dental Advisory Given, Teeth Intact   Pulmonary neg pulmonary ROS,  breath sounds clear to auscultation  Pulmonary exam normal       Cardiovascular Exercise Tolerance: Poor hypertension, Pt. on medications and Pt. on home beta blockers + CAD + dysrhythmias Rhythm:Regular Rate:Normal  Nl ef echo 01/2011. Moderate pulm HTN  2006 Myoview: Negative for evidence of scar or ischemia. EF 66%.   Neuro/Psych PSYCHIATRIC DISORDERS Anxiety Depression negative neurological ROS     GI/Hepatic Neg liver ROS, GERD-  Medicated and Controlled,  Endo/Other  diabetes, Well Controlled, Type 2, Oral Hypoglycemic AgentsHypothyroidism Morbid obesity  Renal/GU Renal InsufficiencyRenal disease     Musculoskeletal  (+) Arthritis -, Osteoarthritis,    Abdominal   Peds  Hematology negative hematology ROS (+) anemia ,   Anesthesia Other Findings   Reproductive/Obstetrics                           Anesthesia Physical  Anesthesia Plan  ASA: III  Anesthesia Plan: General   Post-op Pain Management:    Induction:   Airway Management Planned: Oral ETT and Video Laryngoscope Planned  Additional Equipment:   Intra-op Plan:   Post-operative Plan: Extubation in OR  Informed Consent: I have reviewed the patients History and Physical, chart, labs and discussed the procedure including the risks, benefits and alternatives for the proposed anesthesia with the patient or  authorized representative who has indicated his/her understanding and acceptance.   Dental advisory given  Plan Discussed with: CRNA  Anesthesia Plan Comments:       Anesthesia Quick Evaluation

## 2014-10-01 ENCOUNTER — Encounter (HOSPITAL_COMMUNITY)
Admission: RE | Admit: 2014-10-01 | Discharge: 2014-10-01 | Disposition: A | Discharge status: Home / Self Care | Payer: Medicare Other | Source: Ambulatory Visit | Attending: Nephrology | Admitting: Nephrology

## 2014-10-01 ENCOUNTER — Encounter (HOSPITAL_BASED_OUTPATIENT_CLINIC_OR_DEPARTMENT_OTHER): Payer: Self-pay | Admitting: Plastic Surgery

## 2014-10-01 DIAGNOSIS — N183 Chronic kidney disease, stage 3 (moderate): Secondary | ICD-10-CM | POA: Diagnosis not present

## 2014-10-01 DIAGNOSIS — D631 Anemia in chronic kidney disease: Secondary | ICD-10-CM | POA: Diagnosis not present

## 2014-10-01 LAB — RENAL FUNCTION PANEL
ANION GAP: 11 (ref 5–15)
Albumin: 3 g/dL — ABNORMAL LOW (ref 3.5–5.0)
BUN: 16 mg/dL (ref 6–20)
CALCIUM: 7 mg/dL — AB (ref 8.9–10.3)
CO2: 32 mmol/L (ref 22–32)
Chloride: 96 mmol/L — ABNORMAL LOW (ref 101–111)
Creatinine, Ser: 1.2 mg/dL — ABNORMAL HIGH (ref 0.44–1.00)
GFR calc Af Amer: 52 mL/min — ABNORMAL LOW (ref >60)
GFR calc non Af Amer: 45 mL/min — ABNORMAL LOW (ref >60)
GLUCOSE: 258 mg/dL — AB (ref 70–99)
PHOSPHORUS: 3.2 mg/dL (ref 2.5–4.6)
Potassium: 2.7 mmol/L — CL (ref 3.5–5.1)
Sodium: 139 mmol/L (ref 135–145)

## 2014-10-01 LAB — URINE MICROSCOPIC-ADD ON

## 2014-10-01 LAB — IRON AND TIBC
IRON: 49 ug/dL (ref 28–170)
Saturation Ratios: 19 % (ref 10.4–31.8)
TIBC: 256 ug/dL (ref 250–450)
UIBC: 207 ug/dL

## 2014-10-01 LAB — URINALYSIS, ROUTINE W REFLEX MICROSCOPIC
Bilirubin Urine: NEGATIVE
Glucose, UA: NEGATIVE mg/dL
Ketones, ur: NEGATIVE mg/dL
NITRITE: NEGATIVE
PH: 6.5 (ref 5.0–8.0)
Protein, ur: 30 mg/dL — AB
Specific Gravity, Urine: 1.02 (ref 1.005–1.030)
Urobilinogen, UA: 0.2 mg/dL (ref 0.0–1.0)

## 2014-10-01 LAB — FERRITIN: FERRITIN: 471 ng/mL — AB (ref 11–307)

## 2014-10-01 LAB — HEMOGLOBIN AND HEMATOCRIT, BLOOD
HEMATOCRIT: 31.1 % — AB (ref 36.0–46.0)
HEMOGLOBIN: 9.8 g/dL — AB (ref 12.0–15.0)

## 2014-10-01 MED ORDER — DARBEPOETIN ALFA 100 MCG/0.5ML IJ SOSY
PREFILLED_SYRINGE | INTRAMUSCULAR | Status: AC
  Filled 2014-10-01: qty 0.5

## 2014-10-01 MED ORDER — DARBEPOETIN ALFA 100 MCG/0.5ML IJ SOSY
100.0000 ug | PREFILLED_SYRINGE | INTRAMUSCULAR | Status: DC
  Administered 2014-10-01: 100 ug via SUBCUTANEOUS

## 2014-10-01 NOTE — Progress Notes (Signed)
CRITICAL VALUE ALERT  Critical value received:  K- 2.7  Date of notification:  10/01/14  Time of notification:  [id number]  Critical value read back:Yes.    Nurse who received alert:  C. [facility], RN   Called Dr. Etheleen Nicks office to notify MD of pt.s lab.  H&H & renal function panel was faxed to MDs office per request.  Nurse at office to get in touch with pt.

## 2014-10-01 NOTE — Anesthesia Postprocedure Evaluation (Signed)
  Anesthesia Post-op Note  Patient: [name]  Procedure(s) Performed: Procedure(s): IRRIGATION AND DEBRIDEMENT SACRAL WOUND, EXCISION OF PERIRECTAL TRACT WITH PLACEMENT OF ACCELL (N/A)  Patient Location: PACU  Anesthesia Type:General  Level of Consciousness: awake and alert   Airway and Oxygen Therapy: Patient Spontanous Breathing  Post-op Pain: none  Post-op Assessment: Post-op Vital signs reviewed  Post-op Vital Signs: Reviewed  Last Vitals:  Filed Vitals:   09/30/14 1520  BP: 180/65  Pulse: 78  Temp: 36.7 C  Resp: 18    Complications: No apparent anesthesia complications

## 2014-10-02 DIAGNOSIS — I129 Hypertensive chronic kidney disease with stage 1 through stage 4 chronic kidney disease, or unspecified chronic kidney disease: Secondary | ICD-10-CM | POA: Diagnosis not present

## 2014-10-02 DIAGNOSIS — A1801 Tuberculosis of spine: Secondary | ICD-10-CM | POA: Diagnosis not present

## 2014-10-02 DIAGNOSIS — I2510 Atherosclerotic heart disease of native coronary artery without angina pectoris: Secondary | ICD-10-CM | POA: Diagnosis not present

## 2014-10-02 DIAGNOSIS — N183 Chronic kidney disease, stage 3 (moderate): Secondary | ICD-10-CM | POA: Diagnosis not present

## 2014-10-02 DIAGNOSIS — E119 Type 2 diabetes mellitus without complications: Secondary | ICD-10-CM | POA: Diagnosis not present

## 2014-10-02 DIAGNOSIS — M6281 Muscle weakness (generalized): Secondary | ICD-10-CM | POA: Diagnosis not present

## 2014-10-02 LAB — PTH, INTACT AND CALCIUM
Calcium, Total (PTH): 7 mg/dL — ABNORMAL LOW (ref 8.7–10.3)
PTH: 39 pg/mL (ref 15–65)

## 2014-10-02 NOTE — Progress Notes (Signed)
Results for [name], [name] (MRN [id number]) as of 10/02/2014 08:56  Completed labs from 10/01/2014  Ref. Range 10/01/2014 10:00 10/01/2014 10:50 10/01/2014 10:50 10/01/2014 12:43  CO2 Latest Ref Range: 22-32 mmol/L    32  BUN Latest Ref Range: 6-20 mg/dL    16  Creatinine Latest Ref Range: 0.44-1.00 mg/dL    1.20 (H)  Calcium Latest Ref Range: 8.9-10.3 mg/dL    7.0 (L)  EGFR (Non-African Amer.) Latest Ref Range: >60 mL/min    45 (L)  EGFR (African American) Latest Ref Range: >60 mL/min    52 (L)  Glucose Latest Ref Range: 70-99 mg/dL    258 (H)  Anion gap Latest Ref Range: 5-15     11  Calcium, Total (PTH) Latest Ref Range: 8.7-10.3 mg/dL  7.0 (L)    Phosphorus Latest Ref Range: 2.5-4.6 mg/dL    3.2  Albumin Latest Ref Range: 3.5-5.0 g/dL    3.0 (L)  Iron Latest Ref Range: 28-170 ug/dL 49     UIBC Latest Units: ug/dL 207     TIBC Latest Ref Range: 250-450 ug/dL 256     Saturation Ratios Latest Ref Range: 10.4-31.8 % 19     Ferritin Latest Ref Range: 11-307 ng/mL 471 (H)     Hemoglobin Latest Ref Range: 12.0-15.0 g/dL 9.8 (L)     HCT Latest Ref Range: 36.0-46.0 % 31.1 (L)     PTH Unknown  39 Comment   Appearance Latest Ref Range: CLEAR   CLEAR    Bacteria, UA Latest Ref Range: RARE   RARE    Bilirubin Urine Latest Ref Range: NEGATIVE   NEGATIVE    Color, Urine Latest Ref Range: YELLOW   YELLOW    Glucose Latest Ref Range: NEGATIVE mg/dL  NEGATIVE    Hgb urine dipstick Latest Ref Range: NEGATIVE   MODERATE (A)    Ketones, ur Latest Ref Range: NEGATIVE mg/dL  NEGATIVE    Leukocytes, UA Latest Ref Range: NEGATIVE   SMALL (A)    Nitrite Latest Ref Range: NEGATIVE   NEGATIVE    pH Latest Ref Range: 5.0-8.0   6.5    Protein Latest Ref Range: NEGATIVE mg/dL  30 (A)    RBC / HPF Latest Ref Range: <3 RBC/hpf  0-2    Specific Gravity, Urine Latest Ref Range: 1.005-1.030   1.020    Squamous Epithelial / LPF Latest Ref Range: RARE   FEW (A)    Urobilinogen, UA Latest Ref Range: 0.0-1.0 mg/dL  0.2     WBC, UA Latest Ref Range: <3 WBC/hpf  TOO NUMEROUS TO C.Marland KitchenMarland Kitchen

## 2014-10-03 DIAGNOSIS — A1801 Tuberculosis of spine: Secondary | ICD-10-CM | POA: Diagnosis not present

## 2014-10-03 DIAGNOSIS — I2510 Atherosclerotic heart disease of native coronary artery without angina pectoris: Secondary | ICD-10-CM | POA: Diagnosis not present

## 2014-10-03 DIAGNOSIS — N183 Chronic kidney disease, stage 3 (moderate): Secondary | ICD-10-CM | POA: Diagnosis not present

## 2014-10-03 DIAGNOSIS — I129 Hypertensive chronic kidney disease with stage 1 through stage 4 chronic kidney disease, or unspecified chronic kidney disease: Secondary | ICD-10-CM | POA: Diagnosis not present

## 2014-10-03 DIAGNOSIS — E119 Type 2 diabetes mellitus without complications: Secondary | ICD-10-CM | POA: Diagnosis not present

## 2014-10-03 DIAGNOSIS — M6281 Muscle weakness (generalized): Secondary | ICD-10-CM | POA: Diagnosis not present

## 2014-10-05 DIAGNOSIS — I2510 Atherosclerotic heart disease of native coronary artery without angina pectoris: Secondary | ICD-10-CM | POA: Diagnosis not present

## 2014-10-05 DIAGNOSIS — N183 Chronic kidney disease, stage 3 (moderate): Secondary | ICD-10-CM | POA: Diagnosis not present

## 2014-10-05 DIAGNOSIS — I129 Hypertensive chronic kidney disease with stage 1 through stage 4 chronic kidney disease, or unspecified chronic kidney disease: Secondary | ICD-10-CM | POA: Diagnosis not present

## 2014-10-05 DIAGNOSIS — A1801 Tuberculosis of spine: Secondary | ICD-10-CM | POA: Diagnosis not present

## 2014-10-05 DIAGNOSIS — E119 Type 2 diabetes mellitus without complications: Secondary | ICD-10-CM | POA: Diagnosis not present

## 2014-10-05 DIAGNOSIS — M6281 Muscle weakness (generalized): Secondary | ICD-10-CM | POA: Diagnosis not present

## 2014-10-07 DIAGNOSIS — I129 Hypertensive chronic kidney disease with stage 1 through stage 4 chronic kidney disease, or unspecified chronic kidney disease: Secondary | ICD-10-CM | POA: Diagnosis not present

## 2014-10-07 DIAGNOSIS — I2510 Atherosclerotic heart disease of native coronary artery without angina pectoris: Secondary | ICD-10-CM | POA: Diagnosis not present

## 2014-10-07 DIAGNOSIS — E119 Type 2 diabetes mellitus without complications: Secondary | ICD-10-CM | POA: Diagnosis not present

## 2014-10-07 DIAGNOSIS — N183 Chronic kidney disease, stage 3 (moderate): Secondary | ICD-10-CM | POA: Diagnosis not present

## 2014-10-07 DIAGNOSIS — A1801 Tuberculosis of spine: Secondary | ICD-10-CM | POA: Diagnosis not present

## 2014-10-07 DIAGNOSIS — M6281 Muscle weakness (generalized): Secondary | ICD-10-CM | POA: Diagnosis not present

## 2014-10-09 DIAGNOSIS — A1801 Tuberculosis of spine: Secondary | ICD-10-CM | POA: Diagnosis not present

## 2014-10-09 DIAGNOSIS — M6281 Muscle weakness (generalized): Secondary | ICD-10-CM | POA: Diagnosis not present

## 2014-10-09 DIAGNOSIS — I129 Hypertensive chronic kidney disease with stage 1 through stage 4 chronic kidney disease, or unspecified chronic kidney disease: Secondary | ICD-10-CM | POA: Diagnosis not present

## 2014-10-09 DIAGNOSIS — E119 Type 2 diabetes mellitus without complications: Secondary | ICD-10-CM | POA: Diagnosis not present

## 2014-10-09 DIAGNOSIS — I2510 Atherosclerotic heart disease of native coronary artery without angina pectoris: Secondary | ICD-10-CM | POA: Diagnosis not present

## 2014-10-09 DIAGNOSIS — N183 Chronic kidney disease, stage 3 (moderate): Secondary | ICD-10-CM | POA: Diagnosis not present

## 2014-10-11 ENCOUNTER — Ambulatory Visit (INDEPENDENT_AMBULATORY_CARE_PROVIDER_SITE_OTHER): Payer: Medicare Other | Admitting: Nurse Practitioner

## 2014-10-11 ENCOUNTER — Encounter: Payer: Self-pay | Admitting: Nurse Practitioner

## 2014-10-11 VITALS — BP 220/86 | HR 84 | Temp 97.0°F | Ht 62.0 in | Wt 169.0 lb

## 2014-10-11 DIAGNOSIS — K2100 Gastro-esophageal reflux disease with esophagitis, without bleeding: Secondary | ICD-10-CM

## 2014-10-11 DIAGNOSIS — E1122 Type 2 diabetes mellitus with diabetic chronic kidney disease: Secondary | ICD-10-CM

## 2014-10-11 DIAGNOSIS — N183 Chronic kidney disease, stage 3 (moderate): Secondary | ICD-10-CM | POA: Diagnosis not present

## 2014-10-11 DIAGNOSIS — F411 Generalized anxiety disorder: Secondary | ICD-10-CM | POA: Diagnosis not present

## 2014-10-11 DIAGNOSIS — R609 Edema, unspecified: Secondary | ICD-10-CM

## 2014-10-11 DIAGNOSIS — I2510 Atherosclerotic heart disease of native coronary artery without angina pectoris: Secondary | ICD-10-CM | POA: Diagnosis not present

## 2014-10-11 DIAGNOSIS — E119 Type 2 diabetes mellitus without complications: Secondary | ICD-10-CM | POA: Diagnosis not present

## 2014-10-11 DIAGNOSIS — I129 Hypertensive chronic kidney disease with stage 1 through stage 4 chronic kidney disease, or unspecified chronic kidney disease: Secondary | ICD-10-CM | POA: Diagnosis not present

## 2014-10-11 DIAGNOSIS — I10 Essential (primary) hypertension: Secondary | ICD-10-CM | POA: Diagnosis not present

## 2014-10-11 DIAGNOSIS — E034 Atrophy of thyroid (acquired): Secondary | ICD-10-CM | POA: Diagnosis not present

## 2014-10-11 DIAGNOSIS — E039 Hypothyroidism, unspecified: Secondary | ICD-10-CM

## 2014-10-11 DIAGNOSIS — S72009D Fracture of unspecified part of neck of unspecified femur, subsequent encounter for closed fracture with routine healing: Secondary | ICD-10-CM

## 2014-10-11 DIAGNOSIS — K219 Gastro-esophageal reflux disease without esophagitis: Secondary | ICD-10-CM | POA: Diagnosis not present

## 2014-10-11 DIAGNOSIS — E785 Hyperlipidemia, unspecified: Secondary | ICD-10-CM

## 2014-10-11 DIAGNOSIS — E113499 Type 2 diabetes mellitus with severe nonproliferative diabetic retinopathy without macular edema, unspecified eye: Secondary | ICD-10-CM

## 2014-10-11 DIAGNOSIS — F329 Major depressive disorder, single episode, unspecified: Secondary | ICD-10-CM | POA: Diagnosis not present

## 2014-10-11 DIAGNOSIS — E669 Obesity, unspecified: Secondary | ICD-10-CM | POA: Diagnosis not present

## 2014-10-11 DIAGNOSIS — E876 Hypokalemia: Secondary | ICD-10-CM | POA: Insufficient documentation

## 2014-10-11 DIAGNOSIS — E11349 Type 2 diabetes mellitus with severe nonproliferative diabetic retinopathy without macular edema: Secondary | ICD-10-CM | POA: Diagnosis not present

## 2014-10-11 DIAGNOSIS — Z23 Encounter for immunization: Secondary | ICD-10-CM | POA: Diagnosis not present

## 2014-10-11 DIAGNOSIS — F32A Depression, unspecified: Secondary | ICD-10-CM

## 2014-10-11 DIAGNOSIS — M6281 Muscle weakness (generalized): Secondary | ICD-10-CM | POA: Diagnosis not present

## 2014-10-11 DIAGNOSIS — N189 Chronic kidney disease, unspecified: Secondary | ICD-10-CM

## 2014-10-11 DIAGNOSIS — E038 Other specified hypothyroidism: Secondary | ICD-10-CM | POA: Diagnosis not present

## 2014-10-11 DIAGNOSIS — A1801 Tuberculosis of spine: Secondary | ICD-10-CM | POA: Diagnosis not present

## 2014-10-11 DIAGNOSIS — N259 Disorder resulting from impaired renal tubular function, unspecified: Secondary | ICD-10-CM

## 2014-10-11 MED ORDER — LEVOTHYROXINE SODIUM 75 MCG PO TABS: 75.0000 ug | ORAL_TABLET | Freq: Every day | ORAL | Status: DC

## 2014-10-11 MED ORDER — CLONIDINE HCL 0.1 MG PO TABS: 0.1000 mg | ORAL_TABLET | Freq: Two times a day (BID) | ORAL | Status: DC

## 2014-10-11 MED ORDER — ATORVASTATIN CALCIUM 40 MG PO TABS: 40.0000 mg | ORAL_TABLET | Freq: Every evening | ORAL | Status: DC

## 2014-10-11 MED ORDER — POTASSIUM CHLORIDE CRYS ER 20 MEQ PO TBCR: 20.0000 meq | EXTENDED_RELEASE_TABLET | Freq: Two times a day (BID) | ORAL | Status: DC

## 2014-10-11 MED ORDER — METOPROLOL SUCCINATE ER 50 MG PO TB24: 50.0000 mg | ORAL_TABLET | Freq: Every morning | ORAL | Status: DC

## 2014-10-11 MED ORDER — FUROSEMIDE 40 MG PO TABS: 40.0000 mg | ORAL_TABLET | Freq: Every morning | ORAL | Status: DC

## 2014-10-11 MED ORDER — PANTOPRAZOLE SODIUM 40 MG PO TBEC: 40.0000 mg | DELAYED_RELEASE_TABLET | Freq: Two times a day (BID) | ORAL | Status: DC

## 2014-10-11 MED ORDER — LOSARTAN POTASSIUM 100 MG PO TABS: 100.0000 mg | ORAL_TABLET | Freq: Every morning | ORAL | Status: DC

## 2014-10-11 MED ORDER — GLIMEPIRIDE 4 MG PO TABS: 4.0000 mg | ORAL_TABLET | Freq: Every evening | ORAL | Status: DC

## 2014-10-11 NOTE — Progress Notes (Signed)
Subjective:    Patient ID: [name], female    DOB: 1946/04/19, 69 y.o.   MRN: [id number]  Patient here today for follow up- she is doing well without complaints today. Since last visit patient fell and broke her hip and had to have pins inserted and then had to go to rehab and nursing home and was just recently discharged. Still walking with a walker.  Diabetes She presents for her follow-up diabetic visit. She has type 2 diabetes mellitus. Her disease course has been stable. There are no hypoglycemic associated symptoms. Pertinent negatives for hypoglycemia include no headaches. Associated symptoms include visual change. Pertinent negatives for diabetes include no chest pain, no fatigue, no polydipsia and no polyphagia. There are no diabetic complications. Pertinent negatives for diabetic complications include no CVA. Risk factors for coronary artery disease include dyslipidemia, diabetes mellitus, hypertension and post-menopausal. Current diabetic treatment includes oral agent (monotherapy). Her weight is stable. She is following a diabetic diet. When asked about meal planning, she reported none. She has not had a previous visit with a dietitian. She rarely participates in exercise. Her breakfast blood glucose is taken between 8-9 am. Her breakfast blood glucose range is generally 110-130 mg/dl. Her highest blood glucose is 140-180 mg/dl. Her overall blood glucose range is 110-130 mg/dl. She does not see a podiatrist.Eye exam is not current.  Hyperlipidemia This is a chronic problem. The current episode started more than 1 year ago. Recent lipid tests were reviewed and are variable. Pertinent negatives include no chest pain, myalgias or shortness of breath. Current antihyperlipidemic treatment includes statins. The current treatment provides moderate improvement of lipids. Compliance problems include adherence to diet and adherence to exercise.  Risk factors for coronary artery disease include  diabetes mellitus, dyslipidemia, hypertension, obesity and post-menopausal.  Hypertension This is a chronic problem. The current episode started more than 1 year ago. The problem is unchanged. The problem is uncontrolled. Pertinent negatives include no chest pain, headaches, palpitations or shortness of breath. Risk factors for coronary artery disease include diabetes mellitus, dyslipidemia and obesity. Past treatments include beta blockers and diuretics. The current treatment provides moderate improvement. Compliance problems include diet and exercise.  Hypertensive end-organ damage includes a thyroid problem. There is no history of CAD/MI or CVA.  Gastrophageal Reflux She reports no chest pain, no coughing, no nausea or no sore throat. Pertinent negatives include no fatigue.  Thyroid Problem Presents for follow-up (hypothyroidism) visit. Symptoms include visual change. Patient reports no constipation, diaphoresis, fatigue or palpitations. Her past medical history is significant for hyperlipidemia.  Anxiety and Depression Patient stays anxious and nervous all the time. Worries constantly- meds do not help this. Renal  insufficiency  Sees dr. Chestine Spore every 3 months- no change GERD protonix daily- keeps symptoms under control obesity Unable to exercise due to hip problems- does not watch diet hypokalemia Was started on K+ supplement while in nursing center. No c/o lower ext cramps   *Review of Systems  Constitutional: Negative for diaphoresis and fatigue.  HENT: Negative for sore throat.   Respiratory: Negative for cough and shortness of breath.   Cardiovascular: Negative for chest pain and palpitations.  Gastrointestinal: Negative for nausea and constipation.  Endocrine: Negative for polydipsia and polyphagia.  Musculoskeletal: Negative for myalgias.  Neurological: Negative for headaches.  All other systems reviewed and are negative.      Objective:   Physical Exam  Constitutional:  She is oriented to person, place, and time. She appears well-developed and well-nourished.  HENT:  Head: Normocephalic.  Right Ear: External ear normal.  Mouth/Throat: Oropharynx is clear and moist.  Eyes: Pupils are equal, round, and reactive to light.  Neck: Normal range of motion. Neck supple. No thyromegaly present.  Cardiovascular: Normal rate, regular rhythm, normal heart sounds and intact distal pulses.   Pulmonary/Chest: Effort normal and breath sounds normal.  Abdominal: Soft. Bowel sounds are normal. She exhibits no distension. There is no tenderness.  Musculoskeletal: Normal range of motion. She exhibits edema (trace amt bil ankles).  Pt broke left femur May 2014 Fractured right hip in 2015   Neurological: She is alert and oriented to person, place, and time.  Skin: Skin is warm and dry.  Psychiatric: She has a normal mood and affect. Her behavior is normal. Judgment and thought content normal.  Vitals reviewed. diabetic foot exam within normal limits- (+) monofilament bilaterally         Assessment & Plan:   1. Essential hypertensioo noit add slat t o diet - losartan (COZAAR) 100 MG tablet; Take 1 tablet (100 mg total) by mouth every morning.  Dispense: 90 tablet; Refill: 1 - cloNIDine (CATAPRES) 0.1 MG tablet; Take 1 tablet (0.1 mg total) by mouth 2 (two) times daily.  Dispense: 180 tablet; Refill: 1 - metoprolol succinate (TOPROL-XL) 50 MG 24 hr tablet; Take 1 tablet (50 mg total) by mouth every morning.  Dispense: 90 tablet; Refill: 1 - CMP14+EGFR  2. Hypothyroidism, unspecified hypothyroidism type  3. Type 2 diabetes mellitus with diabetic chronic kidney disease Watch carbs - glimepiride (AMARYL) 4 MG tablet; Take 1 tablet (4 mg total) by mouth every evening. 1 po qam and 1/2 in afternoon  Dispense: 135 tablet; Refill: 1  4. 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 Hip fracture requiring operative repair, unspecified laterality, closed, with routine healing, subsequent encounter  6. Disorder resulting from impaired renal function Keep follow up appointment swith dr. Chestine Spore  7. Obesity Discussed diet and exercise for person with BMI >25 Will recheck weight in 3-6 months   8. Hyperlipidemia with target LDL less than 100 Low fat diet - atorvastatin (LIPITOR) 40 MG tablet; Take 1 tablet (40 mg total) by mouth every evening.  Dispense: 90 tablet; Refill: 1 - NMR, lipoprofile  9. Depression Stress management  10. Anxiety state   11. Gastroesophageal reflux disease without esophagitis Avoid spicy foods Do not eat 2 hours prior to bedtime   12. Hypokalemia - potassium chloride SA (K-DUR,KLOR-CON) 20 MEQ tablet; Take 1 tablet (20 mEq total) by mouth 2 (two) times daily.  Dispense: 90 tablet; Refill: 1  13. Peripheral edema elevate legs when sitting - furosemide (LASIX) 40 MG tablet; Take 1 tablet (40 mg total) by mouth every morning.  Dispense: 90 tablet; Refill: 1  14. Gastroesophageal reflux disease with esophagitis Avoid spicy foods Do not eat 2 hours prior to bedtime - pantoprazole (PROTONIX) 40 MG tablet; Take 1 tablet (40 mg total) by mouth 2 (two) times daily.  Dispense: 180 tablet; Refill: 1  15. Hypothyroidism due to acquired atrophy of thyroid - levothyroxine (SYNTHROID, LEVOTHROID) 75 MCG tablet; Take 1 tablet (75 mcg total) by mouth daily before breakfast.  Dispense: 90 tablet; Refill: 1 - Thyroid Panel With TSH   prevnar 13 today Labs pending Health maintenance reviewed Diet and exercise encouraged Continue all meds Follow up  In 3 month   Waller, FNP

## 2014-10-11 NOTE — Addendum Note (Signed)
Addended by: Rolena Infante on: 10/11/2014 05:08 PM   Modules accepted: Orders

## 2014-10-11 NOTE — Patient Instructions (Signed)

## 2014-10-12 LAB — THYROID PANEL WITH TSH
FREE THYROXINE INDEX: 3.9 (ref 1.2–4.9)
T3 Uptake Ratio: 31 % (ref 24–39)
T4, Total: 12.5 ug/dL — ABNORMAL HIGH (ref 4.5–12.0)
TSH: 3.23 u[IU]/mL (ref 0.450–4.500)

## 2014-10-12 LAB — CMP14+EGFR
A/G RATIO: 1.2 (ref 1.1–2.5)
ALT: 5 IU/L (ref 0–32)
AST: 11 IU/L (ref 0–40)
Albumin: 3.8 g/dL (ref 3.6–4.8)
Alkaline Phosphatase: 180 IU/L — ABNORMAL HIGH (ref 39–117)
BUN/Creatinine Ratio: 12 (ref 11–26)
BUN: 22 mg/dL (ref 8–27)
Bilirubin Total: 0.5 mg/dL (ref 0.0–1.2)
CO2: 22 mmol/L (ref 18–29)
Calcium: 9 mg/dL (ref 8.7–10.3)
Chloride: 93 mmol/L — ABNORMAL LOW (ref 97–108)
Creatinine, Ser: 1.8 mg/dL — ABNORMAL HIGH (ref 0.57–1.00)
GFR calc Af Amer: 33 mL/min/{1.73_m2} — ABNORMAL LOW (ref >59)
GFR calc non Af Amer: 28 mL/min/{1.73_m2} — ABNORMAL LOW (ref >59)
GLUCOSE: 322 mg/dL — AB (ref 65–99)
Globulin, Total: 3.2 g/dL (ref 1.5–4.5)
Potassium: 5.3 mmol/L — ABNORMAL HIGH (ref 3.5–5.2)
SODIUM: 136 mmol/L (ref 134–144)
TOTAL PROTEIN: 7 g/dL (ref 6.0–8.5)

## 2014-10-12 LAB — NMR, LIPOPROFILE
Cholesterol: 153 mg/dL (ref 100–199)
HDL Cholesterol by NMR: 53 mg/dL (ref >39)
HDL Particle Number: 30.3 umol/L — ABNORMAL LOW (ref >=30.5)
LDL Particle Number: 912 nmol/L (ref <1000)
LDL Size: 21 nm (ref >20.5)
LDL-C: 66 mg/dL (ref 0–99)
LP-IR Score: 42 (ref <=45)
SMALL LDL PARTICLE NUMBER: 442 nmol/L (ref <=527)
Triglycerides by NMR: 170 mg/dL — ABNORMAL HIGH (ref 0–149)

## 2014-10-14 ENCOUNTER — Telehealth: Payer: Self-pay | Admitting: Nurse Practitioner

## 2014-10-14 DIAGNOSIS — S31809D Unspecified open wound of unspecified buttock, subsequent encounter: Secondary | ICD-10-CM | POA: Diagnosis not present

## 2014-10-14 DIAGNOSIS — T8189XD Other complications of procedures, not elsewhere classified, subsequent encounter: Secondary | ICD-10-CM | POA: Diagnosis not present

## 2014-10-14 DIAGNOSIS — Y839 Surgical procedure, unspecified as the cause of abnormal reaction of the patient, or of later complication, without mention of misadventure at the time of the procedure: Secondary | ICD-10-CM | POA: Diagnosis not present

## 2014-10-14 NOTE — Telephone Encounter (Signed)
Pt notified of results Verbalizes understanding 

## 2014-10-16 ENCOUNTER — Other Ambulatory Visit: Payer: Self-pay | Admitting: Nurse Practitioner

## 2014-10-16 DIAGNOSIS — E1122 Type 2 diabetes mellitus with diabetic chronic kidney disease: Secondary | ICD-10-CM

## 2014-10-16 DIAGNOSIS — N183 Chronic kidney disease, stage 3 (moderate): Secondary | ICD-10-CM | POA: Diagnosis not present

## 2014-10-16 DIAGNOSIS — I2510 Atherosclerotic heart disease of native coronary artery without angina pectoris: Secondary | ICD-10-CM | POA: Diagnosis not present

## 2014-10-16 DIAGNOSIS — I129 Hypertensive chronic kidney disease with stage 1 through stage 4 chronic kidney disease, or unspecified chronic kidney disease: Secondary | ICD-10-CM | POA: Diagnosis not present

## 2014-10-16 DIAGNOSIS — E119 Type 2 diabetes mellitus without complications: Secondary | ICD-10-CM | POA: Diagnosis not present

## 2014-10-16 DIAGNOSIS — M6281 Muscle weakness (generalized): Secondary | ICD-10-CM | POA: Diagnosis not present

## 2014-10-16 DIAGNOSIS — A1801 Tuberculosis of spine: Secondary | ICD-10-CM | POA: Diagnosis not present

## 2014-10-16 MED ORDER — GLIMEPIRIDE 4 MG PO TABS: 4.0000 mg | ORAL_TABLET | Freq: Every evening | ORAL | Status: DC

## 2014-10-17 ENCOUNTER — Emergency Department (HOSPITAL_COMMUNITY)
Admission: EM | Admit: 2014-10-17 | Discharge: 2014-10-17 | Disposition: A | Discharge status: Home / Self Care | Payer: Medicare Other | Attending: Emergency Medicine | Admitting: Emergency Medicine

## 2014-10-17 ENCOUNTER — Emergency Department (HOSPITAL_COMMUNITY): Payer: Medicare Other

## 2014-10-17 ENCOUNTER — Encounter (HOSPITAL_COMMUNITY): Payer: Self-pay

## 2014-10-17 DIAGNOSIS — Z79899 Other long term (current) drug therapy: Secondary | ICD-10-CM | POA: Insufficient documentation

## 2014-10-17 DIAGNOSIS — E1143 Type 2 diabetes mellitus with diabetic autonomic (poly)neuropathy: Secondary | ICD-10-CM | POA: Diagnosis not present

## 2014-10-17 DIAGNOSIS — K219 Gastro-esophageal reflux disease without esophagitis: Secondary | ICD-10-CM | POA: Insufficient documentation

## 2014-10-17 DIAGNOSIS — I2510 Atherosclerotic heart disease of native coronary artery without angina pectoris: Secondary | ICD-10-CM | POA: Diagnosis not present

## 2014-10-17 DIAGNOSIS — K5900 Constipation, unspecified: Secondary | ICD-10-CM

## 2014-10-17 DIAGNOSIS — Z8739 Personal history of other diseases of the musculoskeletal system and connective tissue: Secondary | ICD-10-CM | POA: Insufficient documentation

## 2014-10-17 DIAGNOSIS — E11319 Type 2 diabetes mellitus with unspecified diabetic retinopathy without macular edema: Secondary | ICD-10-CM | POA: Diagnosis not present

## 2014-10-17 DIAGNOSIS — I129 Hypertensive chronic kidney disease with stage 1 through stage 4 chronic kidney disease, or unspecified chronic kidney disease: Secondary | ICD-10-CM | POA: Insufficient documentation

## 2014-10-17 DIAGNOSIS — F411 Generalized anxiety disorder: Secondary | ICD-10-CM | POA: Insufficient documentation

## 2014-10-17 DIAGNOSIS — Z872 Personal history of diseases of the skin and subcutaneous tissue: Secondary | ICD-10-CM | POA: Insufficient documentation

## 2014-10-17 DIAGNOSIS — E039 Hypothyroidism, unspecified: Secondary | ICD-10-CM | POA: Insufficient documentation

## 2014-10-17 DIAGNOSIS — Z86010 Personal history of colonic polyps: Secondary | ICD-10-CM | POA: Diagnosis not present

## 2014-10-17 DIAGNOSIS — E785 Hyperlipidemia, unspecified: Secondary | ICD-10-CM | POA: Insufficient documentation

## 2014-10-17 DIAGNOSIS — N183 Chronic kidney disease, stage 3 (moderate): Secondary | ICD-10-CM | POA: Insufficient documentation

## 2014-10-17 MED ORDER — DOCUSATE SODIUM 100 MG PO CAPS: 100.0000 mg | ORAL_CAPSULE | Freq: Two times a day (BID) | ORAL | Status: DC

## 2014-10-17 MED ORDER — PEG 3350-KCL-NABCB-NACL-NASULF 236 G PO SOLR: ORAL | Status: DC

## 2014-10-17 NOTE — ED Notes (Signed)
Pt given fleet enema

## 2014-10-17 NOTE — ED Provider Notes (Signed)
CSN: [id number]     Arrival date & time 10/17/14  1014 History  This chart was scribed for [name] Ferguson, MD by Girtha Hake, ED Scribe. The patient was seen in [place]. The patient's care was started at 11:40 AM.     Chief Complaint  Patient presents with  . Constipation   Patient is a 69 y.o. female presenting with constipation. The history is provided by the patient. No language interpreter was used.  Constipation Severity:  Moderate Time since last bowel movement:  5 days Timing:  Constant Progression:  Unchanged Chronicity:  Chronic Relieved by:  Nothing Ineffective treatments: prune juice and dulcolax. Associated symptoms: no abdominal pain, no back pain and no diarrhea    HPI Comments: [name] is a 69 y.o. female who presents to the Emergency Department complaining of constipation beginning 5 days ago. She reports that she had a small BM yesterday but otherwise has not had a BM for 5 days. She has taken Dulcolax and prune juice with no relief of her constipation.  Patient also reports an abscess on her buttocks. She had surgery on the abscess on 09/30/14.  PCP is Dr. Hassell Done.  Past Medical History  Diagnosis Date  . Hypertension   . Hyperlipidemia   . GERD (gastroesophageal reflux disease)   . GAD (generalized anxiety disorder)   . Hypothyroidism   . Type II diabetes mellitus   . Arthritis     knees and hand/fingers  . Right bundle branch block   . LAFB (left anterior fascicular block)   . History of rectal abscess     12-29-2004  bedside I & D  . History of GI bleed     upper 2009  due to esophagitis  &  2002  due to Mallory-Weiss tear  . History of colon polyps     benign  . Diverticulosis of colon   . History of esophagitis   . Diabetic gastroparesis   . History of gout     in issues for several years  . Sacral decubitus ulcer     since 2014  . History of Mallory-Weiss syndrome     12/ 2002--  resolved  . Coronary artery disease   .  Complication of anesthesia     post-op confusion   . CKD (chronic kidney disease), stage III     nephrologist--  dr Mercy Moore  . Diabetic retinopathy     bilateral --  monitored by dr [name]  . Anemia in chronic renal disease     Aranesp injection --  when Hg <11   Past Surgical History  Procedure Laterality Date  . Retinopathy surgery Bilateral 1980's?  . Orif femur fracture Left 10/09/2012    Procedure: OPEN REDUCTION INTERNAL FIXATION (ORIF) DISTAL FEMUR FRACTURE;  Surgeon: Rozanna Box, MD;  Location: [facility];  Service: Orthopedics;  Laterality: Left;  . Compression hip screw Right 05/18/2014    Procedure: COMPRESSION HIP;  Surgeon: Carole Civil, MD;  Location: [facility];  Service: Orthopedics;  Laterality: Right;  . Esophagogastroduodenoscopy  last one 01-09-2011  . Transthoracic echocardiogram  02-18-2011    mild LVH,  ef 55-60%,  grade I diastolic dysfunction/  mild TR/  RV systolic pressure increased consistant with moderate pulmonary hypertension  . Cardiovascular stress test  12-30-2004    normal perfusion study/  no ischemia or infartion/  normal LV wall function and wall motion , ef 66%  . Colonoscopy w/ polypectomy  last one 2008  .  Cataract extraction w/ intraocular lens  implant, bilateral  1995  . Incision and drainage of wound N/A 09/30/2014    Procedure: IRRIGATION AND DEBRIDEMENT SACRAL WOUND, EXCISION OF PERIRECTAL TRACT WITH PLACEMENT OF ACCELL;  Surgeon: Theodoro Kos, DO;  Location: [facility];  Service: Plastics;  Laterality: N/A;   Family History  Problem Relation Age of Onset  . Colon cancer Father   . Prostate cancer Father   . Diabetes Father   . Coronary artery disease Mother 43  . Heart disease Mother   . Diabetes Mother   . Coronary artery disease Sister 83  . Diabetes Sister   . Heart disease Sister   . Diabetes Maternal Grandmother   . Diabetes Sister   . Diabetes Sister   . Diabetes Sister   . Diabetes Sister   . Diabetes  Sister    History  Substance Use Topics  . Smoking status: Never Smoker   . Smokeless tobacco: Never Used  . Alcohol Use: No   OB History    No data available     Review of Systems  Constitutional: Negative for appetite change and fatigue.  HENT: Negative for congestion, ear discharge and sinus pressure.   Eyes: Negative for discharge.  Respiratory: Negative for cough.   Cardiovascular: Negative for chest pain.  Gastrointestinal: Positive for constipation. Negative for abdominal pain and diarrhea.  Genitourinary: Negative for frequency and hematuria.  Musculoskeletal: Negative for back pain.  Skin: Positive for wound (abscess). Negative for rash.  Neurological: Negative for seizures and headaches.  Psychiatric/Behavioral: Negative for hallucinations.      Allergies  Aspirin; Ciprofloxacin; Codeine; Micardis; Other; and Rofecoxib  Home Medications   Prior to Admission medications   Medication Sig Start Date End Date Taking? Authorizing Provider  atorvastatin (LIPITOR) 40 MG tablet Take 1 tablet (40 mg total) by mouth every evening. 10/11/14   Mary-Margaret Hassell Done, FNP  calcitRIOL (ROCALTROL) 0.25 MCG capsule Take 1 capsule (0.25 mcg total) by mouth daily. Patient taking differently: Take 0.25 mcg by mouth every morning.  02/16/13   Mary-Margaret Hassell Done, FNP  cloNIDine (CATAPRES) 0.1 MG tablet Take 1 tablet (0.1 mg total) by mouth 2 (two) times daily. 10/11/14   Mary-Margaret Hassell Done, FNP  darbepoetin (ARANESP) 100 MCG/0.5ML SOLN injection Inject 100 mcg into the skin every 5 (five) weeks. LAST INJECTION MARCH 2016--  When Hgb is <11    Historical Provider, MD  furosemide (LASIX) 40 MG tablet Take 1 tablet (40 mg total) by mouth every morning. 10/11/14   Mary-Margaret Hassell Done, FNP  glimepiride (AMARYL) 4 MG tablet Take 1 tablet (4 mg total) by mouth every evening. 1 po qam and 1/2 in afternoon 10/16/14   Mary-Margaret Hassell Done, FNP  hydrALAZINE (APRESOLINE) 50 MG tablet Take 1 tablet  (50 mg total) by mouth every 8 (eight) hours. Patient taking differently: Take 50 mg by mouth 3 (three) times daily.  05/23/14   Nita Sells, MD  levothyroxine (SYNTHROID, LEVOTHROID) 75 MCG tablet Take 1 tablet (75 mcg total) by mouth daily before breakfast. 10/11/14   Mary-Margaret Hassell Done, FNP  losartan (COZAAR) 100 MG tablet Take 1 tablet (100 mg total) by mouth every morning. 10/11/14   Mary-Margaret Hassell Done, FNP  metoprolol succinate (TOPROL-XL) 50 MG 24 hr tablet Take 1 tablet (50 mg total) by mouth every morning. 10/11/14   Mary-Margaret Hassell Done, FNP  pantoprazole (PROTONIX) 40 MG tablet Take 1 tablet (40 mg total) by mouth 2 (two) times daily. 10/11/14   Mary-Margaret Hassell Done, FNP  potassium chloride SA (K-DUR,KLOR-CON) 20 MEQ tablet Take 1 tablet (20 mEq total) by mouth 2 (two) times daily. 10/11/14   Mary-Margaret Hassell Done, FNP   Triage Vitals: BP 171/64 mmHg  Pulse 73  Temp(Src) 99.1 F (37.3 C) (Oral)  Resp 18  Ht 5\' 2"  (1.575 m)  Wt 159 lb (72.122 kg)  BMI 29.07 kg/m2  SpO2 100% Physical Exam  Constitutional: She is oriented to person, place, and time. She appears well-developed.  HENT:  Head: Normocephalic.  Eyes: Conjunctivae and EOM are normal. No scleral icterus.  Neck: Neck supple. No thyromegaly present.  Cardiovascular: Normal rate and regular rhythm.  Exam reveals no gallop and no friction rub.   No murmur heard. Pulmonary/Chest: No stridor. She has no wheezes. She has no rales. She exhibits no tenderness.  Abdominal: She exhibits no distension. There is tenderness. There is no rebound.  Minimal LLQ tenderness.  Musculoskeletal: Normal range of motion. She exhibits no edema.  Lymphadenopathy:    She has no cervical adenopathy.  Neurological: She is oriented to person, place, and time. She exhibits normal muscle tone. Coordination normal.  Skin: No rash noted. No erythema.  Healing pilonidal cyst on right buttocks.  Psychiatric: She has a normal mood and affect.  Her behavior is normal.  Nursing note and vitals reviewed.   ED Course  Procedures (including critical care time) DIAGNOSTIC STUDIES: Oxygen Saturation is 100% on room air, normal by my interpretation.    COORDINATION OF CARE:    Labs Review Labs Reviewed - No data to display  Imaging Review No results found.   EKG Interpretation None      MDM   Final diagnoses:  None   Constipation.   The chart was scribed for me under my direct supervision.  I personally performed the history, physical, and medical decision making and all procedures in the evaluation of this patient.[name] Ferguson, MD 10/17/14 [phone]

## 2014-10-17 NOTE — Discharge Instructions (Signed)
Follow up with your md next week for recheck °

## 2014-10-17 NOTE — ED Notes (Signed)
Pt alert & oriented x4, stable gait. Patient given discharge instructions, paperwork & prescription(s). Patient  instructed to stop at the registration desk to finish any additional paperwork. Patient verbalized understanding. Pt left department w/ no further questions. 

## 2014-10-17 NOTE — ED Notes (Signed)
Pt c/o constipation and gas.  Reports last normal bm was Saturday.  Reports had small bm yesterday.  Has been taking dulcolax, MOM,  and has been drinking prune juice.   Pt reports has an abscess on buttocks and goes to a wound center for treatment.  Reports has surgery on abscess on May 9th.

## 2014-10-18 DIAGNOSIS — I129 Hypertensive chronic kidney disease with stage 1 through stage 4 chronic kidney disease, or unspecified chronic kidney disease: Secondary | ICD-10-CM | POA: Diagnosis not present

## 2014-10-18 DIAGNOSIS — M6281 Muscle weakness (generalized): Secondary | ICD-10-CM | POA: Diagnosis not present

## 2014-10-18 DIAGNOSIS — E119 Type 2 diabetes mellitus without complications: Secondary | ICD-10-CM | POA: Diagnosis not present

## 2014-10-18 DIAGNOSIS — N183 Chronic kidney disease, stage 3 (moderate): Secondary | ICD-10-CM | POA: Diagnosis not present

## 2014-10-18 DIAGNOSIS — A1801 Tuberculosis of spine: Secondary | ICD-10-CM | POA: Diagnosis not present

## 2014-10-18 DIAGNOSIS — I2510 Atherosclerotic heart disease of native coronary artery without angina pectoris: Secondary | ICD-10-CM | POA: Diagnosis not present

## 2014-10-21 DIAGNOSIS — I129 Hypertensive chronic kidney disease with stage 1 through stage 4 chronic kidney disease, or unspecified chronic kidney disease: Secondary | ICD-10-CM | POA: Diagnosis not present

## 2014-10-21 DIAGNOSIS — E119 Type 2 diabetes mellitus without complications: Secondary | ICD-10-CM | POA: Diagnosis not present

## 2014-10-21 DIAGNOSIS — N183 Chronic kidney disease, stage 3 (moderate): Secondary | ICD-10-CM | POA: Diagnosis not present

## 2014-10-21 DIAGNOSIS — M6281 Muscle weakness (generalized): Secondary | ICD-10-CM | POA: Diagnosis not present

## 2014-10-21 DIAGNOSIS — A1801 Tuberculosis of spine: Secondary | ICD-10-CM | POA: Diagnosis not present

## 2014-10-21 DIAGNOSIS — I2510 Atherosclerotic heart disease of native coronary artery without angina pectoris: Secondary | ICD-10-CM | POA: Diagnosis not present

## 2014-10-23 DIAGNOSIS — A1801 Tuberculosis of spine: Secondary | ICD-10-CM | POA: Diagnosis not present

## 2014-10-23 DIAGNOSIS — E119 Type 2 diabetes mellitus without complications: Secondary | ICD-10-CM | POA: Diagnosis not present

## 2014-10-23 DIAGNOSIS — M6281 Muscle weakness (generalized): Secondary | ICD-10-CM | POA: Diagnosis not present

## 2014-10-23 DIAGNOSIS — I129 Hypertensive chronic kidney disease with stage 1 through stage 4 chronic kidney disease, or unspecified chronic kidney disease: Secondary | ICD-10-CM | POA: Diagnosis not present

## 2014-10-23 DIAGNOSIS — I2510 Atherosclerotic heart disease of native coronary artery without angina pectoris: Secondary | ICD-10-CM | POA: Diagnosis not present

## 2014-10-23 DIAGNOSIS — N183 Chronic kidney disease, stage 3 (moderate): Secondary | ICD-10-CM | POA: Diagnosis not present

## 2014-10-25 ENCOUNTER — Telehealth: Payer: Self-pay | Admitting: Nurse Practitioner

## 2014-10-25 DIAGNOSIS — I129 Hypertensive chronic kidney disease with stage 1 through stage 4 chronic kidney disease, or unspecified chronic kidney disease: Secondary | ICD-10-CM | POA: Diagnosis not present

## 2014-10-25 DIAGNOSIS — E1122 Type 2 diabetes mellitus with diabetic chronic kidney disease: Secondary | ICD-10-CM

## 2014-10-25 DIAGNOSIS — E119 Type 2 diabetes mellitus without complications: Secondary | ICD-10-CM | POA: Diagnosis not present

## 2014-10-25 DIAGNOSIS — M6281 Muscle weakness (generalized): Secondary | ICD-10-CM | POA: Diagnosis not present

## 2014-10-25 DIAGNOSIS — A1801 Tuberculosis of spine: Secondary | ICD-10-CM | POA: Diagnosis not present

## 2014-10-25 DIAGNOSIS — I2510 Atherosclerotic heart disease of native coronary artery without angina pectoris: Secondary | ICD-10-CM | POA: Diagnosis not present

## 2014-10-25 DIAGNOSIS — N183 Chronic kidney disease, stage 3 (moderate): Secondary | ICD-10-CM | POA: Diagnosis not present

## 2014-10-27 DIAGNOSIS — I129 Hypertensive chronic kidney disease with stage 1 through stage 4 chronic kidney disease, or unspecified chronic kidney disease: Secondary | ICD-10-CM | POA: Diagnosis not present

## 2014-10-27 DIAGNOSIS — I2510 Atherosclerotic heart disease of native coronary artery without angina pectoris: Secondary | ICD-10-CM | POA: Diagnosis not present

## 2014-10-27 DIAGNOSIS — E119 Type 2 diabetes mellitus without complications: Secondary | ICD-10-CM | POA: Diagnosis not present

## 2014-10-27 DIAGNOSIS — A1801 Tuberculosis of spine: Secondary | ICD-10-CM | POA: Diagnosis not present

## 2014-10-27 DIAGNOSIS — N183 Chronic kidney disease, stage 3 (moderate): Secondary | ICD-10-CM | POA: Diagnosis not present

## 2014-10-28 ENCOUNTER — Telehealth: Payer: Self-pay | Admitting: Family

## 2014-10-28 DIAGNOSIS — I2510 Atherosclerotic heart disease of native coronary artery without angina pectoris: Secondary | ICD-10-CM | POA: Diagnosis not present

## 2014-10-28 DIAGNOSIS — E119 Type 2 diabetes mellitus without complications: Secondary | ICD-10-CM | POA: Diagnosis not present

## 2014-10-28 DIAGNOSIS — N183 Chronic kidney disease, stage 3 (moderate): Secondary | ICD-10-CM | POA: Diagnosis not present

## 2014-10-28 DIAGNOSIS — I129 Hypertensive chronic kidney disease with stage 1 through stage 4 chronic kidney disease, or unspecified chronic kidney disease: Secondary | ICD-10-CM | POA: Diagnosis not present

## 2014-10-28 DIAGNOSIS — A1801 Tuberculosis of spine: Secondary | ICD-10-CM | POA: Diagnosis not present

## 2014-10-28 MED ORDER — GLIMEPIRIDE 4 MG PO TABS: ORAL_TABLET | ORAL | Status: DC

## 2014-10-28 NOTE — Telephone Encounter (Signed)
Patient notified that rxs were received by pharmacy and should be on the way. Offered to call in a couple weeks worth to a local pharmacy and patient states that she has 8 days worth of pills left and she should be fine. Advised patient if she does not receive meds in time to contact us to let us know. Patient verbalized understanding

## 2014-10-28 NOTE — Telephone Encounter (Signed)
This needs to be clarified, 2 sets of directions and # is wrong, pls resend, after clarification

## 2014-10-28 NOTE — Telephone Encounter (Signed)
Prescription sent to pharmacy.

## 2014-10-28 NOTE — Telephone Encounter (Signed)
COMPUTER SAYS THEY GOT HER RX TODAY AT 12:30

## 2014-10-29 ENCOUNTER — Telehealth: Payer: Self-pay | Admitting: Nurse Practitioner

## 2014-10-29 ENCOUNTER — Other Ambulatory Visit: Payer: Self-pay

## 2014-10-29 MED ORDER — CALCITRIOL 0.25 MCG PO CAPS: 0.2500 ug | ORAL_CAPSULE | Freq: Every day | ORAL | Status: DC

## 2014-10-30 DIAGNOSIS — N183 Chronic kidney disease, stage 3 (moderate): Secondary | ICD-10-CM | POA: Diagnosis not present

## 2014-10-30 DIAGNOSIS — A1801 Tuberculosis of spine: Secondary | ICD-10-CM | POA: Diagnosis not present

## 2014-10-30 DIAGNOSIS — I129 Hypertensive chronic kidney disease with stage 1 through stage 4 chronic kidney disease, or unspecified chronic kidney disease: Secondary | ICD-10-CM | POA: Diagnosis not present

## 2014-10-30 DIAGNOSIS — I2510 Atherosclerotic heart disease of native coronary artery without angina pectoris: Secondary | ICD-10-CM | POA: Diagnosis not present

## 2014-10-30 DIAGNOSIS — E119 Type 2 diabetes mellitus without complications: Secondary | ICD-10-CM | POA: Diagnosis not present

## 2014-10-31 ENCOUNTER — Other Ambulatory Visit: Payer: Self-pay | Admitting: General Surgery

## 2014-10-31 DIAGNOSIS — S31000A Unspecified open wound of lower back and pelvis without penetration into retroperitoneum, initial encounter: Secondary | ICD-10-CM

## 2014-10-31 DIAGNOSIS — S31000D Unspecified open wound of lower back and pelvis without penetration into retroperitoneum, subsequent encounter: Secondary | ICD-10-CM | POA: Diagnosis not present

## 2014-11-01 DIAGNOSIS — A1801 Tuberculosis of spine: Secondary | ICD-10-CM | POA: Diagnosis not present

## 2014-11-01 DIAGNOSIS — I2510 Atherosclerotic heart disease of native coronary artery without angina pectoris: Secondary | ICD-10-CM | POA: Diagnosis not present

## 2014-11-01 DIAGNOSIS — E119 Type 2 diabetes mellitus without complications: Secondary | ICD-10-CM | POA: Diagnosis not present

## 2014-11-01 DIAGNOSIS — I129 Hypertensive chronic kidney disease with stage 1 through stage 4 chronic kidney disease, or unspecified chronic kidney disease: Secondary | ICD-10-CM | POA: Diagnosis not present

## 2014-11-01 DIAGNOSIS — N183 Chronic kidney disease, stage 3 (moderate): Secondary | ICD-10-CM | POA: Diagnosis not present

## 2014-11-04 ENCOUNTER — Encounter (HOSPITAL_COMMUNITY)
Admission: RE | Admit: 2014-11-04 | Discharge: 2014-11-04 | Disposition: A | Discharge status: Home / Self Care | Payer: Medicare Other | Source: Ambulatory Visit | Attending: Nephrology | Admitting: Nephrology

## 2014-11-04 ENCOUNTER — Ambulatory Visit (HOSPITAL_COMMUNITY)
Admission: RE | Admit: 2014-11-04 | Discharge: 2014-11-04 | Disposition: A | Discharge status: Home / Self Care | Payer: Medicare Other | Source: Ambulatory Visit | Attending: General Surgery | Admitting: General Surgery

## 2014-11-04 DIAGNOSIS — D631 Anemia in chronic kidney disease: Secondary | ICD-10-CM | POA: Insufficient documentation

## 2014-11-04 DIAGNOSIS — N183 Chronic kidney disease, stage 3 (moderate): Secondary | ICD-10-CM | POA: Diagnosis not present

## 2014-11-04 DIAGNOSIS — K603 Anal fistula: Secondary | ICD-10-CM | POA: Diagnosis not present

## 2014-11-04 DIAGNOSIS — Z5181 Encounter for therapeutic drug level monitoring: Secondary | ICD-10-CM | POA: Insufficient documentation

## 2014-11-04 DIAGNOSIS — Z79899 Other long term (current) drug therapy: Secondary | ICD-10-CM | POA: Insufficient documentation

## 2014-11-04 LAB — HEMOGLOBIN AND HEMATOCRIT, BLOOD
HCT: 34.3 % — ABNORMAL LOW (ref 36.0–46.0)
Hemoglobin: 10.9 g/dL — ABNORMAL LOW (ref 12.0–15.0)

## 2014-11-04 MED ORDER — DARBEPOETIN ALFA 100 MCG/0.5ML IJ SOSY
PREFILLED_SYRINGE | INTRAMUSCULAR | Status: AC
  Filled 2014-11-04: qty 0.5

## 2014-11-04 MED ORDER — DARBEPOETIN ALFA 100 MCG/0.5ML IJ SOSY
100.0000 ug | PREFILLED_SYRINGE | INTRAMUSCULAR | Status: DC
  Administered 2014-11-04: 100 ug via SUBCUTANEOUS

## 2014-11-04 NOTE — Progress Notes (Signed)
hgb 10.9 therefore med given as ordered. Labs faxed to Dr Mercy Moore.

## 2014-11-05 ENCOUNTER — Ambulatory Visit (HOSPITAL_COMMUNITY): Payer: Medicare Other

## 2014-11-05 ENCOUNTER — Other Ambulatory Visit (HOSPITAL_COMMUNITY): Payer: Medicare Other

## 2014-11-05 DIAGNOSIS — I129 Hypertensive chronic kidney disease with stage 1 through stage 4 chronic kidney disease, or unspecified chronic kidney disease: Secondary | ICD-10-CM | POA: Diagnosis not present

## 2014-11-05 DIAGNOSIS — N183 Chronic kidney disease, stage 3 (moderate): Secondary | ICD-10-CM | POA: Diagnosis not present

## 2014-11-05 DIAGNOSIS — E119 Type 2 diabetes mellitus without complications: Secondary | ICD-10-CM | POA: Diagnosis not present

## 2014-11-05 DIAGNOSIS — A1801 Tuberculosis of spine: Secondary | ICD-10-CM | POA: Diagnosis not present

## 2014-11-05 DIAGNOSIS — I2510 Atherosclerotic heart disease of native coronary artery without angina pectoris: Secondary | ICD-10-CM | POA: Diagnosis not present

## 2014-11-08 DIAGNOSIS — E119 Type 2 diabetes mellitus without complications: Secondary | ICD-10-CM | POA: Diagnosis not present

## 2014-11-08 DIAGNOSIS — I2510 Atherosclerotic heart disease of native coronary artery without angina pectoris: Secondary | ICD-10-CM | POA: Diagnosis not present

## 2014-11-08 DIAGNOSIS — N183 Chronic kidney disease, stage 3 (moderate): Secondary | ICD-10-CM | POA: Diagnosis not present

## 2014-11-08 DIAGNOSIS — A1801 Tuberculosis of spine: Secondary | ICD-10-CM | POA: Diagnosis not present

## 2014-11-08 DIAGNOSIS — I129 Hypertensive chronic kidney disease with stage 1 through stage 4 chronic kidney disease, or unspecified chronic kidney disease: Secondary | ICD-10-CM | POA: Diagnosis not present

## 2014-11-11 ENCOUNTER — Encounter (HOSPITAL_BASED_OUTPATIENT_CLINIC_OR_DEPARTMENT_OTHER): Payer: Medicare Other | Attending: Plastic Surgery

## 2014-11-11 DIAGNOSIS — X58XXXD Exposure to other specified factors, subsequent encounter: Secondary | ICD-10-CM | POA: Insufficient documentation

## 2014-11-11 DIAGNOSIS — S31809D Unspecified open wound of unspecified buttock, subsequent encounter: Secondary | ICD-10-CM | POA: Diagnosis not present

## 2014-11-11 DIAGNOSIS — E119 Type 2 diabetes mellitus without complications: Secondary | ICD-10-CM | POA: Diagnosis not present

## 2014-11-13 DIAGNOSIS — I129 Hypertensive chronic kidney disease with stage 1 through stage 4 chronic kidney disease, or unspecified chronic kidney disease: Secondary | ICD-10-CM | POA: Diagnosis not present

## 2014-11-13 DIAGNOSIS — A1801 Tuberculosis of spine: Secondary | ICD-10-CM | POA: Diagnosis not present

## 2014-11-13 DIAGNOSIS — E119 Type 2 diabetes mellitus without complications: Secondary | ICD-10-CM | POA: Diagnosis not present

## 2014-11-13 DIAGNOSIS — N183 Chronic kidney disease, stage 3 (moderate): Secondary | ICD-10-CM | POA: Diagnosis not present

## 2014-11-13 DIAGNOSIS — I2510 Atherosclerotic heart disease of native coronary artery without angina pectoris: Secondary | ICD-10-CM | POA: Diagnosis not present

## 2014-11-15 DIAGNOSIS — I2510 Atherosclerotic heart disease of native coronary artery without angina pectoris: Secondary | ICD-10-CM | POA: Diagnosis not present

## 2014-11-15 DIAGNOSIS — I129 Hypertensive chronic kidney disease with stage 1 through stage 4 chronic kidney disease, or unspecified chronic kidney disease: Secondary | ICD-10-CM | POA: Diagnosis not present

## 2014-11-15 DIAGNOSIS — N183 Chronic kidney disease, stage 3 (moderate): Secondary | ICD-10-CM | POA: Diagnosis not present

## 2014-11-15 DIAGNOSIS — A1801 Tuberculosis of spine: Secondary | ICD-10-CM | POA: Diagnosis not present

## 2014-11-15 DIAGNOSIS — E119 Type 2 diabetes mellitus without complications: Secondary | ICD-10-CM | POA: Diagnosis not present

## 2014-11-18 ENCOUNTER — Ambulatory Visit (INDEPENDENT_AMBULATORY_CARE_PROVIDER_SITE_OTHER): Payer: Medicare Other

## 2014-11-18 ENCOUNTER — Ambulatory Visit (INDEPENDENT_AMBULATORY_CARE_PROVIDER_SITE_OTHER): Payer: Medicare Other | Admitting: Orthopedic Surgery

## 2014-11-18 ENCOUNTER — Encounter: Payer: Self-pay | Admitting: Orthopedic Surgery

## 2014-11-18 VITALS — BP 138/61 | Ht 62.0 in | Wt 160.0 lb

## 2014-11-18 DIAGNOSIS — I2510 Atherosclerotic heart disease of native coronary artery without angina pectoris: Secondary | ICD-10-CM | POA: Diagnosis not present

## 2014-11-18 DIAGNOSIS — M25562 Pain in left knee: Secondary | ICD-10-CM

## 2014-11-18 DIAGNOSIS — A1801 Tuberculosis of spine: Secondary | ICD-10-CM | POA: Diagnosis not present

## 2014-11-18 DIAGNOSIS — I129 Hypertensive chronic kidney disease with stage 1 through stage 4 chronic kidney disease, or unspecified chronic kidney disease: Secondary | ICD-10-CM | POA: Diagnosis not present

## 2014-11-18 DIAGNOSIS — N183 Chronic kidney disease, stage 3 (moderate): Secondary | ICD-10-CM | POA: Diagnosis not present

## 2014-11-18 DIAGNOSIS — E119 Type 2 diabetes mellitus without complications: Secondary | ICD-10-CM | POA: Diagnosis not present

## 2014-11-18 NOTE — Patient Instructions (Signed)
Will order home health pt for left leg weakness and gait training

## 2014-11-18 NOTE — Progress Notes (Signed)
Patient ID: [name], female   DOB: [birth year], 69 y.o.   MRN: [id number] Chief Complaint  Patient presents with  . Follow-up    6 week follow up right hip + gait s/p home therapy, DOS 05/18/14    6 months status post dynamic hip screw for the trochanter fracture of the right hip. The patient and she complains that her left leg gives way. She had open treatment internal fixation left distal femur many years ago. She has some mild to moderate arthritis on x-ray but she's not having any knee pain. She denies back pain. She says when she's walking if she doesn't have her walker her left leg nonoperative leg will give way. She says that she wants her leg x-rayed. I tried to convince her to get a back x-ray she declined and wanted the leg x-rayed  She moves her right hip fine she has good flexion of the hip and knee with good strength  X-rays of the left leg were performed and they were normal. Hardware seen in the distal femur  Recommend x-ray lumbar spine on her next visit  No follow-up needed for the right hip

## 2014-11-20 DIAGNOSIS — N183 Chronic kidney disease, stage 3 (moderate): Secondary | ICD-10-CM | POA: Diagnosis not present

## 2014-11-20 DIAGNOSIS — I129 Hypertensive chronic kidney disease with stage 1 through stage 4 chronic kidney disease, or unspecified chronic kidney disease: Secondary | ICD-10-CM | POA: Diagnosis not present

## 2014-11-20 DIAGNOSIS — A1801 Tuberculosis of spine: Secondary | ICD-10-CM | POA: Diagnosis not present

## 2014-11-20 DIAGNOSIS — E119 Type 2 diabetes mellitus without complications: Secondary | ICD-10-CM | POA: Diagnosis not present

## 2014-11-20 DIAGNOSIS — I2510 Atherosclerotic heart disease of native coronary artery without angina pectoris: Secondary | ICD-10-CM | POA: Diagnosis not present

## 2014-11-21 ENCOUNTER — Ambulatory Visit (INDEPENDENT_AMBULATORY_CARE_PROVIDER_SITE_OTHER): Payer: Medicare Other | Admitting: Family Medicine

## 2014-11-21 DIAGNOSIS — E119 Type 2 diabetes mellitus without complications: Secondary | ICD-10-CM

## 2014-11-21 DIAGNOSIS — A1801 Tuberculosis of spine: Secondary | ICD-10-CM

## 2014-11-21 DIAGNOSIS — I129 Hypertensive chronic kidney disease with stage 1 through stage 4 chronic kidney disease, or unspecified chronic kidney disease: Secondary | ICD-10-CM

## 2014-11-22 DIAGNOSIS — I2510 Atherosclerotic heart disease of native coronary artery without angina pectoris: Secondary | ICD-10-CM | POA: Diagnosis not present

## 2014-11-22 DIAGNOSIS — A1801 Tuberculosis of spine: Secondary | ICD-10-CM | POA: Diagnosis not present

## 2014-11-22 DIAGNOSIS — I129 Hypertensive chronic kidney disease with stage 1 through stage 4 chronic kidney disease, or unspecified chronic kidney disease: Secondary | ICD-10-CM | POA: Diagnosis not present

## 2014-11-22 DIAGNOSIS — N183 Chronic kidney disease, stage 3 (moderate): Secondary | ICD-10-CM | POA: Diagnosis not present

## 2014-11-22 DIAGNOSIS — E119 Type 2 diabetes mellitus without complications: Secondary | ICD-10-CM | POA: Diagnosis not present

## 2014-11-25 DIAGNOSIS — N183 Chronic kidney disease, stage 3 (moderate): Secondary | ICD-10-CM | POA: Diagnosis not present

## 2014-11-25 DIAGNOSIS — I129 Hypertensive chronic kidney disease with stage 1 through stage 4 chronic kidney disease, or unspecified chronic kidney disease: Secondary | ICD-10-CM | POA: Diagnosis not present

## 2014-11-25 DIAGNOSIS — I2510 Atherosclerotic heart disease of native coronary artery without angina pectoris: Secondary | ICD-10-CM | POA: Diagnosis not present

## 2014-11-25 DIAGNOSIS — A1801 Tuberculosis of spine: Secondary | ICD-10-CM | POA: Diagnosis not present

## 2014-11-25 DIAGNOSIS — E119 Type 2 diabetes mellitus without complications: Secondary | ICD-10-CM | POA: Diagnosis not present

## 2014-11-27 DIAGNOSIS — I129 Hypertensive chronic kidney disease with stage 1 through stage 4 chronic kidney disease, or unspecified chronic kidney disease: Secondary | ICD-10-CM | POA: Diagnosis not present

## 2014-11-27 DIAGNOSIS — I2510 Atherosclerotic heart disease of native coronary artery without angina pectoris: Secondary | ICD-10-CM | POA: Diagnosis not present

## 2014-11-27 DIAGNOSIS — E119 Type 2 diabetes mellitus without complications: Secondary | ICD-10-CM | POA: Diagnosis not present

## 2014-11-27 DIAGNOSIS — N183 Chronic kidney disease, stage 3 (moderate): Secondary | ICD-10-CM | POA: Diagnosis not present

## 2014-11-27 DIAGNOSIS — A1801 Tuberculosis of spine: Secondary | ICD-10-CM | POA: Diagnosis not present

## 2014-11-29 DIAGNOSIS — I129 Hypertensive chronic kidney disease with stage 1 through stage 4 chronic kidney disease, or unspecified chronic kidney disease: Secondary | ICD-10-CM | POA: Diagnosis not present

## 2014-11-29 DIAGNOSIS — I2510 Atherosclerotic heart disease of native coronary artery without angina pectoris: Secondary | ICD-10-CM | POA: Diagnosis not present

## 2014-11-29 DIAGNOSIS — A1801 Tuberculosis of spine: Secondary | ICD-10-CM | POA: Diagnosis not present

## 2014-11-29 DIAGNOSIS — E119 Type 2 diabetes mellitus without complications: Secondary | ICD-10-CM | POA: Diagnosis not present

## 2014-11-29 DIAGNOSIS — N183 Chronic kidney disease, stage 3 (moderate): Secondary | ICD-10-CM | POA: Diagnosis not present

## 2014-12-02 ENCOUNTER — Other Ambulatory Visit: Payer: Self-pay | Admitting: General Surgery

## 2014-12-02 DIAGNOSIS — E119 Type 2 diabetes mellitus without complications: Secondary | ICD-10-CM | POA: Diagnosis not present

## 2014-12-02 DIAGNOSIS — N183 Chronic kidney disease, stage 3 (moderate): Secondary | ICD-10-CM | POA: Diagnosis not present

## 2014-12-02 DIAGNOSIS — A1801 Tuberculosis of spine: Secondary | ICD-10-CM | POA: Diagnosis not present

## 2014-12-02 DIAGNOSIS — S31000D Unspecified open wound of lower back and pelvis without penetration into retroperitoneum, subsequent encounter: Secondary | ICD-10-CM | POA: Diagnosis not present

## 2014-12-02 DIAGNOSIS — I2510 Atherosclerotic heart disease of native coronary artery without angina pectoris: Secondary | ICD-10-CM | POA: Diagnosis not present

## 2014-12-02 DIAGNOSIS — I129 Hypertensive chronic kidney disease with stage 1 through stage 4 chronic kidney disease, or unspecified chronic kidney disease: Secondary | ICD-10-CM | POA: Diagnosis not present

## 2014-12-02 DIAGNOSIS — K603 Anal fistula: Secondary | ICD-10-CM | POA: Diagnosis not present

## 2014-12-02 NOTE — Progress Notes (Signed)
[name]. Shankle 12/02/2014 11:09 AM Location: [facility] Patient #: [id number] DOB: [birth year] Married / Language: [name] / Race: White Female History of Present Illness Odis Hollingshead MD; 12/02/2014 11:38 AM) The patient is a 69 year old female    Note:She presents for a preoperative visit. She has a nonhealing posterior sacral wound that is just above the anus. 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 Have not been able to demonstrate this here in the office.  Allergies Elbert Ewings, CMA; 12/02/2014 11:10 AM) Aspirin *ANALGESICS - NonNarcotic* Ciprofloxacin *CHEMICALS* Codeine Phosphate *ANALGESICS - OPIOID* Micardis *ANTIHYPERTENSIVES*  Medication History Elbert Ewings, CMA; 12/02/2014 11:10 AM) Atorvastatin Calcium (40MG  Tablet, Oral) Active. CloNIDine HCl (0.2MG  Tablet, Oral) Active. Furosemide (40MG  Tablet, Oral) Active. Glimepiride (4MG  Tablet, Oral) Active. HydrALAZINE HCl (50MG  Tablet, Oral) Active. Levothyroxine Sodium (75MCG Tablet, Oral) Active. Losartan Potassium (100MG  Tablet, Oral) Active. Metoclopramide HCl (5MG  Tablet, Oral) Active. Metoprolol Succinate ER (50MG  Tablet ER 24HR, Oral) Active. Mupirocin (2% Ointment, External) Active. Pantoprazole Sodium (40MG  Tablet DR, Oral) Active. Pioglitazone HCl (30MG  Tablet, Oral) Active. SPS (15GM/60ML Suspension, Oral) Active. Medications Reconciled    Vitals Elbert Ewings CMA; 12/02/2014 11:10 AM) 12/02/2014 11:10 AM Weight: 169 lb Height: 62in Body Surface Area: 1.83 m Body Mass Index: 30.91 kg/m Temp.: 98.48F(Oral)  Pulse: 56 (Regular)  Resp.: 17 (Unlabored)  BP: 138/78 (Sitting, Left Arm, Standard)     Physical Exam Odis Hollingshead MD; 12/02/2014 11:38 AM)  The physical exam findings are as follows: Note:General-overweight female in no acute distress. Her son is with her.  Cardiovascular-regular rate and  rhythm  Lungs-clear to auscultation  Anorectal-posterior sacral wound just superior to the posterior anus that is clean.    Assessment & Plan Odis Hollingshead MD; 12/02/2014 11:41 AM)  WOUND OF SACRAL REGION, SUBSEQUENT ENCOUNTER (V58.89  S31.000D) Impression: Looks about the same to me today.  Current Plans Schedule for Surgery Pt Education - [facility] Free Text Education/Instructions: discussed with patient and provided information. 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  Plan: Exam under anesthesia with possible fistulotomy versus seton placement. The procedure and risks were discussed in detail with her and her son. The risks include but are not limited to bleeding, infection, failure of wound healing, anesthesia, incontinence, need for further surgery. They seem to understand this and would like to proceed. She will need to be in the prone position.  Jackolyn Confer, MD

## 2014-12-04 DIAGNOSIS — N183 Chronic kidney disease, stage 3 (moderate): Secondary | ICD-10-CM | POA: Diagnosis not present

## 2014-12-04 DIAGNOSIS — E119 Type 2 diabetes mellitus without complications: Secondary | ICD-10-CM | POA: Diagnosis not present

## 2014-12-04 DIAGNOSIS — I2510 Atherosclerotic heart disease of native coronary artery without angina pectoris: Secondary | ICD-10-CM | POA: Diagnosis not present

## 2014-12-04 DIAGNOSIS — A1801 Tuberculosis of spine: Secondary | ICD-10-CM | POA: Diagnosis not present

## 2014-12-04 DIAGNOSIS — I129 Hypertensive chronic kidney disease with stage 1 through stage 4 chronic kidney disease, or unspecified chronic kidney disease: Secondary | ICD-10-CM | POA: Diagnosis not present

## 2014-12-05 ENCOUNTER — Encounter: Payer: Self-pay | Admitting: Orthopedic Surgery

## 2014-12-05 ENCOUNTER — Ambulatory Visit (INDEPENDENT_AMBULATORY_CARE_PROVIDER_SITE_OTHER): Payer: Medicare Other

## 2014-12-05 ENCOUNTER — Ambulatory Visit (INDEPENDENT_AMBULATORY_CARE_PROVIDER_SITE_OTHER): Payer: Medicare Other | Admitting: Orthopedic Surgery

## 2014-12-05 VITALS — BP 149/48 | Ht 62.0 in | Wt 160.0 lb

## 2014-12-05 DIAGNOSIS — R29898 Other symptoms and signs involving the musculoskeletal system: Secondary | ICD-10-CM

## 2014-12-05 DIAGNOSIS — S72142D Displaced intertrochanteric fracture of left femur, subsequent encounter for closed fracture with routine healing: Secondary | ICD-10-CM

## 2014-12-05 DIAGNOSIS — M4716 Other spondylosis with myelopathy, lumbar region: Secondary | ICD-10-CM

## 2014-12-05 NOTE — Patient Instructions (Signed)
We will order MRI and call you with appt and results  We will refer to Dr Carloyn Manner once we get MRI  We will check on home health PT for gait training

## 2014-12-05 NOTE — Addendum Note (Signed)
Addended by: Baldomero Lamy B on: 12/05/2014 11:08 AM   Modules accepted: Orders

## 2014-12-05 NOTE — Progress Notes (Signed)
Patient ID: [name], female   DOB: 1946/05/10, 69 y.o.   MRN: [id number] New problem    Chief Complaint  Patient presents with  . Leg Problem    left leg weakness and giving out x 4 months     [name] is a 69 y.o. female.   HPI 69 years old presented with a new problem that's been bothering her since her surgery left leg giving way with left leg weakness status post open treatment internal fixation distal femur fracture with routine healing many years ago. She was convinced it was her lower leg because she felt pain down there we x-rayed that last visit and that was normal  She has continued back pain and pain in the lower leg with weakness and collapsing of the left lower leg with no apparent ligamentous injury  She still goes to the bathroom normally. But she does have weakness in the left leg and numbness in the left tibial and dorsum of the foot Review of Systems See hpi  Past Medical History  Diagnosis Date  . Hypertension   . Hyperlipidemia   . GERD (gastroesophageal reflux disease)   . GAD (generalized anxiety disorder)   . Hypothyroidism   . Type II diabetes mellitus   . Arthritis     knees and hand/fingers  . Right bundle branch block   . LAFB (left anterior fascicular block)   . History of rectal abscess     12-29-2004  bedside I & D  . History of GI bleed     upper 2009  due to esophagitis  &  2002  due to Mallory-Weiss tear  . History of colon polyps     benign  . Diverticulosis of colon   . History of esophagitis   . Diabetic gastroparesis   . History of gout     in issues for several years  . Sacral decubitus ulcer     since 2014  . History of Mallory-Weiss syndrome     12/ 2002--  resolved  . Coronary artery disease   . Complication of anesthesia     post-op confusion   . CKD (chronic kidney disease), stage III     nephrologist--  dr Mercy Moore  . Diabetic retinopathy     bilateral --  monitored by dr [name]  . Anemia in chronic renal  disease     Aranesp injection --  when Hg <11    Past Surgical History  Procedure Laterality Date  . Retinopathy surgery Bilateral 1980's?  . Orif femur fracture Left 10/09/2012    Procedure: OPEN REDUCTION INTERNAL FIXATION (ORIF) DISTAL FEMUR FRACTURE;  Surgeon: Rozanna Box, MD;  Location: [facility];  Service: Orthopedics;  Laterality: Left;  . Compression hip screw Right 05/18/2014    Procedure: COMPRESSION HIP;  Surgeon: Carole Civil, MD;  Location: [facility];  Service: Orthopedics;  Laterality: Right;  . Esophagogastroduodenoscopy  last one 01-09-2011  . Transthoracic echocardiogram  02-18-2011    mild LVH,  ef 55-60%,  grade I diastolic dysfunction/  mild TR/  RV systolic pressure increased consistant with moderate pulmonary hypertension  . Cardiovascular stress test  12-30-2004    normal perfusion study/  no ischemia or infartion/  normal LV wall function and wall motion , ef 66%  . Colonoscopy w/ polypectomy  last one 2008  . Cataract extraction w/ intraocular lens  implant, bilateral  1995  . Incision and drainage of wound N/A 09/30/2014  Procedure: IRRIGATION AND DEBRIDEMENT SACRAL WOUND, EXCISION OF PERIRECTAL TRACT WITH PLACEMENT OF ACCELL;  Surgeon: Theodoro Kos, DO;  Location: [facility];  Service: Plastics;  Laterality: N/A;    Family History  Problem Relation Age of Onset  . Colon cancer Father   . Prostate cancer Father   . Diabetes Father   . Coronary artery disease Mother 35  . Heart disease Mother   . Diabetes Mother   . Coronary artery disease Sister 53  . Diabetes Sister   . Heart disease Sister   . Diabetes Maternal Grandmother   . Diabetes Sister   . Diabetes Sister   . Diabetes Sister   . Diabetes Sister   . Diabetes Sister     Social History History  Substance Use Topics  . Smoking status: Never Smoker   . Smokeless tobacco: Never Used  . Alcohol Use: No    Allergies  Allergen Reactions  . Aspirin Nausea And Vomiting  .  Ciprofloxacin Other (See Comments)    Upset Stomach  . Codeine Nausea And Vomiting    Makes me sick   . Micardis [Telmisartan] Other (See Comments)    unknown  . Other     No otc pain medications  . Rofecoxib Other (See Comments)    unknown    Current Outpatient Prescriptions  Medication Sig Dispense Refill  . atorvastatin (LIPITOR) 40 MG tablet Take 1 tablet (40 mg total) by mouth every evening. 90 tablet 1  . calcitRIOL (ROCALTROL) 0.25 MCG capsule Take 1 capsule (0.25 mcg total) by mouth daily. 90 capsule 1  . cloNIDine (CATAPRES) 0.1 MG tablet Take 1 tablet (0.1 mg total) by mouth 2 (two) times daily. 180 tablet 1  . docusate sodium (COLACE) 100 MG capsule Take 1 capsule (100 mg total) by mouth every 12 (twelve) hours. 60 capsule 0  . furosemide (LASIX) 40 MG tablet Take 1 tablet (40 mg total) by mouth every morning. 90 tablet 1  . glimepiride (AMARYL) 4 MG tablet 1 po qam and 1/2 in afternoon 225 tablet 1  . hydrALAZINE (APRESOLINE) 50 MG tablet Take 1 tablet (50 mg total) by mouth every 8 (eight) hours. (Patient taking differently: Take 50 mg by mouth 3 (three) times daily. ) 90 tablet 0  . levothyroxine (SYNTHROID, LEVOTHROID) 75 MCG tablet Take 1 tablet (75 mcg total) by mouth daily before breakfast. 90 tablet 1  . losartan (COZAAR) 100 MG tablet Take 1 tablet (100 mg total) by mouth every morning. 90 tablet 1  . metoprolol succinate (TOPROL-XL) 50 MG 24 hr tablet Take 1 tablet (50 mg total) by mouth every morning. 90 tablet 1  . pantoprazole (PROTONIX) 40 MG tablet Take 1 tablet (40 mg total) by mouth 2 (two) times daily. 180 tablet 1  . polyethylene glycol (GOLYTELY) 236 G solution Drink one 8 ounce glass every hour until you have a bowel movement 4000 mL 0  . potassium chloride SA (K-DUR,KLOR-CON) 20 MEQ tablet Take 1 tablet (20 mEq total) by mouth 2 (two) times daily. 90 tablet 1   No current facility-administered medications for this visit.       Physical Exam Blood  pressure 149/48, height 5\' 2"  (1.575 m), weight 160 lb (72.576 kg). Physical Exam The patient is well developed well nourished and well groomed. Orientation to person place and time is normal  Mood is pleasant. Ambulatory status she walks with a walker it is a labored gait She has painful range of motion of  her left hip and is noted to have arthritis but no instability and no instability in the knee mild joint line tenderness both medial and lateral without knee instability or perfusion scans intact she has decreased sensation dorsum of the foot mild peripheral edema  Data Reviewed The x-rays we took today show lumbar spondylosis old compression fractures at L5 4 and L3 and lumbar spondylosis and the facet joints at L5-S1  Assessment Encounter Diagnoses  Name Primary?  . Left leg weakness Yes  . Lumbar spondylosis with myelopathy   . Intertrochanteric fracture of left hip, closed, with routine healing, subsequent encounter     Plan She will need to see a spine specialist and we will order the MRI and make a referral per her request

## 2014-12-06 DIAGNOSIS — A1801 Tuberculosis of spine: Secondary | ICD-10-CM | POA: Diagnosis not present

## 2014-12-06 DIAGNOSIS — I129 Hypertensive chronic kidney disease with stage 1 through stage 4 chronic kidney disease, or unspecified chronic kidney disease: Secondary | ICD-10-CM | POA: Diagnosis not present

## 2014-12-06 DIAGNOSIS — E119 Type 2 diabetes mellitus without complications: Secondary | ICD-10-CM | POA: Diagnosis not present

## 2014-12-06 DIAGNOSIS — N183 Chronic kidney disease, stage 3 (moderate): Secondary | ICD-10-CM | POA: Diagnosis not present

## 2014-12-06 DIAGNOSIS — I2510 Atherosclerotic heart disease of native coronary artery without angina pectoris: Secondary | ICD-10-CM | POA: Diagnosis not present

## 2014-12-09 DIAGNOSIS — I129 Hypertensive chronic kidney disease with stage 1 through stage 4 chronic kidney disease, or unspecified chronic kidney disease: Secondary | ICD-10-CM | POA: Diagnosis not present

## 2014-12-09 DIAGNOSIS — A1801 Tuberculosis of spine: Secondary | ICD-10-CM | POA: Diagnosis not present

## 2014-12-09 DIAGNOSIS — I2510 Atherosclerotic heart disease of native coronary artery without angina pectoris: Secondary | ICD-10-CM | POA: Diagnosis not present

## 2014-12-09 DIAGNOSIS — N183 Chronic kidney disease, stage 3 (moderate): Secondary | ICD-10-CM | POA: Diagnosis not present

## 2014-12-09 DIAGNOSIS — E119 Type 2 diabetes mellitus without complications: Secondary | ICD-10-CM | POA: Diagnosis not present

## 2014-12-10 ENCOUNTER — Encounter (HOSPITAL_COMMUNITY)
Admission: RE | Admit: 2014-12-10 | Discharge: 2014-12-10 | Disposition: A | Discharge status: Home / Self Care | Payer: Medicare Other | Source: Ambulatory Visit | Attending: Nephrology | Admitting: Nephrology

## 2014-12-10 ENCOUNTER — Encounter (HOSPITAL_COMMUNITY): Payer: Self-pay

## 2014-12-10 DIAGNOSIS — N183 Chronic kidney disease, stage 3 (moderate): Secondary | ICD-10-CM | POA: Insufficient documentation

## 2014-12-10 DIAGNOSIS — Z5181 Encounter for therapeutic drug level monitoring: Secondary | ICD-10-CM | POA: Insufficient documentation

## 2014-12-10 DIAGNOSIS — Z79899 Other long term (current) drug therapy: Secondary | ICD-10-CM | POA: Diagnosis not present

## 2014-12-10 DIAGNOSIS — D631 Anemia in chronic kidney disease: Secondary | ICD-10-CM | POA: Insufficient documentation

## 2014-12-10 LAB — HEMOGLOBIN AND HEMATOCRIT, BLOOD
HEMATOCRIT: 38.8 % (ref 36.0–46.0)
Hemoglobin: 12.7 g/dL (ref 12.0–15.0)

## 2014-12-10 LAB — IRON AND TIBC
Iron: 104 ug/dL (ref 28–170)
SATURATION RATIOS: 36 % — AB (ref 10.4–31.8)
TIBC: 288 ug/dL (ref 250–450)
UIBC: 184 ug/dL

## 2014-12-10 LAB — FERRITIN: FERRITIN: 545 ng/mL — AB (ref 11–307)

## 2014-12-10 MED ORDER — DARBEPOETIN ALFA 100 MCG/0.5ML IJ SOSY: 100.0000 ug | PREFILLED_SYRINGE | INTRAMUSCULAR | Status: DC

## 2014-12-10 MED ORDER — CLONIDINE HCL 0.1 MG PO TABS
0.1000 mg | ORAL_TABLET | Freq: Once | ORAL | Status: AC
  Administered 2014-12-10: 0.1 mg via ORAL
  Filled 2014-12-10: qty 1

## 2014-12-11 DIAGNOSIS — E119 Type 2 diabetes mellitus without complications: Secondary | ICD-10-CM | POA: Diagnosis not present

## 2014-12-11 DIAGNOSIS — I129 Hypertensive chronic kidney disease with stage 1 through stage 4 chronic kidney disease, or unspecified chronic kidney disease: Secondary | ICD-10-CM | POA: Diagnosis not present

## 2014-12-11 DIAGNOSIS — I2510 Atherosclerotic heart disease of native coronary artery without angina pectoris: Secondary | ICD-10-CM | POA: Diagnosis not present

## 2014-12-11 DIAGNOSIS — N183 Chronic kidney disease, stage 3 (moderate): Secondary | ICD-10-CM | POA: Diagnosis not present

## 2014-12-11 DIAGNOSIS — A1801 Tuberculosis of spine: Secondary | ICD-10-CM | POA: Diagnosis not present

## 2014-12-11 NOTE — Progress Notes (Signed)
Results for [name], [name] (MRN [id number]) as of 12/11/2014 13:13  Ref. Range 12/10/2014 09:51  Iron Latest Ref Range: 28-170 ug/dL 104  UIBC Latest Units: ug/dL 184  TIBC Latest Ref Range: 250-450 ug/dL 288  Saturation Ratios Latest Ref Range: 10.4-31.8 % 36 (H)  Ferritin Latest Ref Range: 11-307 ng/mL 545 (H)  Hemoglobin Latest Ref Range: 12.0-15.0 g/dL 12.7  HCT Latest Ref Range: 36.0-46.0 % 38.8

## 2014-12-12 DIAGNOSIS — I2510 Atherosclerotic heart disease of native coronary artery without angina pectoris: Secondary | ICD-10-CM | POA: Diagnosis not present

## 2014-12-12 DIAGNOSIS — I129 Hypertensive chronic kidney disease with stage 1 through stage 4 chronic kidney disease, or unspecified chronic kidney disease: Secondary | ICD-10-CM | POA: Diagnosis not present

## 2014-12-12 DIAGNOSIS — A1801 Tuberculosis of spine: Secondary | ICD-10-CM | POA: Diagnosis not present

## 2014-12-12 DIAGNOSIS — N183 Chronic kidney disease, stage 3 (moderate): Secondary | ICD-10-CM | POA: Diagnosis not present

## 2014-12-12 DIAGNOSIS — E119 Type 2 diabetes mellitus without complications: Secondary | ICD-10-CM | POA: Diagnosis not present

## 2014-12-13 DIAGNOSIS — A1801 Tuberculosis of spine: Secondary | ICD-10-CM | POA: Diagnosis not present

## 2014-12-13 DIAGNOSIS — I129 Hypertensive chronic kidney disease with stage 1 through stage 4 chronic kidney disease, or unspecified chronic kidney disease: Secondary | ICD-10-CM | POA: Diagnosis not present

## 2014-12-13 DIAGNOSIS — E119 Type 2 diabetes mellitus without complications: Secondary | ICD-10-CM | POA: Diagnosis not present

## 2014-12-13 DIAGNOSIS — I2510 Atherosclerotic heart disease of native coronary artery without angina pectoris: Secondary | ICD-10-CM | POA: Diagnosis not present

## 2014-12-13 DIAGNOSIS — N183 Chronic kidney disease, stage 3 (moderate): Secondary | ICD-10-CM | POA: Diagnosis not present

## 2014-12-16 DIAGNOSIS — E119 Type 2 diabetes mellitus without complications: Secondary | ICD-10-CM | POA: Diagnosis not present

## 2014-12-16 DIAGNOSIS — A1801 Tuberculosis of spine: Secondary | ICD-10-CM | POA: Diagnosis not present

## 2014-12-16 DIAGNOSIS — I2510 Atherosclerotic heart disease of native coronary artery without angina pectoris: Secondary | ICD-10-CM | POA: Diagnosis not present

## 2014-12-16 DIAGNOSIS — N183 Chronic kidney disease, stage 3 (moderate): Secondary | ICD-10-CM | POA: Diagnosis not present

## 2014-12-16 DIAGNOSIS — I129 Hypertensive chronic kidney disease with stage 1 through stage 4 chronic kidney disease, or unspecified chronic kidney disease: Secondary | ICD-10-CM | POA: Diagnosis not present

## 2014-12-17 DIAGNOSIS — A1801 Tuberculosis of spine: Secondary | ICD-10-CM | POA: Diagnosis not present

## 2014-12-17 DIAGNOSIS — I2510 Atherosclerotic heart disease of native coronary artery without angina pectoris: Secondary | ICD-10-CM | POA: Diagnosis not present

## 2014-12-17 DIAGNOSIS — E119 Type 2 diabetes mellitus without complications: Secondary | ICD-10-CM | POA: Diagnosis not present

## 2014-12-17 DIAGNOSIS — I129 Hypertensive chronic kidney disease with stage 1 through stage 4 chronic kidney disease, or unspecified chronic kidney disease: Secondary | ICD-10-CM | POA: Diagnosis not present

## 2014-12-17 DIAGNOSIS — N183 Chronic kidney disease, stage 3 (moderate): Secondary | ICD-10-CM | POA: Diagnosis not present

## 2014-12-18 ENCOUNTER — Encounter: Payer: Self-pay | Admitting: Internal Medicine

## 2014-12-18 DIAGNOSIS — A1801 Tuberculosis of spine: Secondary | ICD-10-CM | POA: Diagnosis not present

## 2014-12-18 DIAGNOSIS — E119 Type 2 diabetes mellitus without complications: Secondary | ICD-10-CM | POA: Diagnosis not present

## 2014-12-18 DIAGNOSIS — I129 Hypertensive chronic kidney disease with stage 1 through stage 4 chronic kidney disease, or unspecified chronic kidney disease: Secondary | ICD-10-CM | POA: Diagnosis not present

## 2014-12-18 DIAGNOSIS — I2510 Atherosclerotic heart disease of native coronary artery without angina pectoris: Secondary | ICD-10-CM | POA: Diagnosis not present

## 2014-12-18 DIAGNOSIS — N183 Chronic kidney disease, stage 3 (moderate): Secondary | ICD-10-CM | POA: Diagnosis not present

## 2014-12-20 DIAGNOSIS — N183 Chronic kidney disease, stage 3 (moderate): Secondary | ICD-10-CM | POA: Diagnosis not present

## 2014-12-20 DIAGNOSIS — A1801 Tuberculosis of spine: Secondary | ICD-10-CM | POA: Diagnosis not present

## 2014-12-20 DIAGNOSIS — I129 Hypertensive chronic kidney disease with stage 1 through stage 4 chronic kidney disease, or unspecified chronic kidney disease: Secondary | ICD-10-CM | POA: Diagnosis not present

## 2014-12-20 DIAGNOSIS — I2510 Atherosclerotic heart disease of native coronary artery without angina pectoris: Secondary | ICD-10-CM | POA: Diagnosis not present

## 2014-12-20 DIAGNOSIS — E119 Type 2 diabetes mellitus without complications: Secondary | ICD-10-CM | POA: Diagnosis not present

## 2014-12-23 ENCOUNTER — Encounter (HOSPITAL_BASED_OUTPATIENT_CLINIC_OR_DEPARTMENT_OTHER): Payer: Medicare Other | Attending: Plastic Surgery

## 2014-12-23 DIAGNOSIS — E119 Type 2 diabetes mellitus without complications: Secondary | ICD-10-CM | POA: Insufficient documentation

## 2014-12-23 DIAGNOSIS — K604 Rectal fistula: Secondary | ICD-10-CM | POA: Diagnosis not present

## 2014-12-23 DIAGNOSIS — X58XXXD Exposure to other specified factors, subsequent encounter: Secondary | ICD-10-CM | POA: Insufficient documentation

## 2014-12-23 DIAGNOSIS — S31809S Unspecified open wound of unspecified buttock, sequela: Secondary | ICD-10-CM | POA: Diagnosis not present

## 2014-12-24 ENCOUNTER — Encounter (HOSPITAL_COMMUNITY)
Admission: RE | Admit: 2014-12-24 | Discharge: 2014-12-24 | Disposition: A | Discharge status: Home / Self Care | Payer: Medicare Other | Source: Ambulatory Visit | Attending: Nephrology | Admitting: Nephrology

## 2014-12-24 DIAGNOSIS — N183 Chronic kidney disease, stage 3 (moderate): Secondary | ICD-10-CM | POA: Diagnosis not present

## 2014-12-24 DIAGNOSIS — D638 Anemia in other chronic diseases classified elsewhere: Secondary | ICD-10-CM | POA: Insufficient documentation

## 2014-12-24 LAB — HEMOGLOBIN AND HEMATOCRIT, BLOOD
HCT: 37 % (ref 36.0–46.0)
HEMOGLOBIN: 11.7 g/dL — AB (ref 12.0–15.0)

## 2014-12-24 MED ORDER — DARBEPOETIN ALFA 100 MCG/0.5ML IJ SOSY
100.0000 ug | PREFILLED_SYRINGE | INTRAMUSCULAR | Status: DC
  Administered 2014-12-24: 100 ug via SUBCUTANEOUS
  Filled 2014-12-24: qty 0.5

## 2014-12-25 DIAGNOSIS — E119 Type 2 diabetes mellitus without complications: Secondary | ICD-10-CM | POA: Diagnosis not present

## 2014-12-25 DIAGNOSIS — N183 Chronic kidney disease, stage 3 (moderate): Secondary | ICD-10-CM | POA: Diagnosis not present

## 2014-12-25 DIAGNOSIS — A1801 Tuberculosis of spine: Secondary | ICD-10-CM | POA: Diagnosis not present

## 2014-12-25 DIAGNOSIS — I2510 Atherosclerotic heart disease of native coronary artery without angina pectoris: Secondary | ICD-10-CM | POA: Diagnosis not present

## 2014-12-25 DIAGNOSIS — I129 Hypertensive chronic kidney disease with stage 1 through stage 4 chronic kidney disease, or unspecified chronic kidney disease: Secondary | ICD-10-CM | POA: Diagnosis not present

## 2014-12-26 ENCOUNTER — Ambulatory Visit (HOSPITAL_COMMUNITY)
Admission: RE | Admit: 2014-12-26 | Discharge: 2014-12-26 | Disposition: A | Discharge status: Home / Self Care | Payer: Medicare Other | Source: Ambulatory Visit | Attending: Orthopedic Surgery | Admitting: Orthopedic Surgery

## 2014-12-26 DIAGNOSIS — I129 Hypertensive chronic kidney disease with stage 1 through stage 4 chronic kidney disease, or unspecified chronic kidney disease: Secondary | ICD-10-CM | POA: Diagnosis not present

## 2014-12-26 DIAGNOSIS — R29898 Other symptoms and signs involving the musculoskeletal system: Secondary | ICD-10-CM

## 2014-12-26 DIAGNOSIS — X58XXXD Exposure to other specified factors, subsequent encounter: Secondary | ICD-10-CM | POA: Insufficient documentation

## 2014-12-26 DIAGNOSIS — S32048G Other fracture of fourth lumbar vertebra, subsequent encounter for fracture with delayed healing: Secondary | ICD-10-CM | POA: Insufficient documentation

## 2014-12-26 DIAGNOSIS — M4806 Spinal stenosis, lumbar region: Secondary | ICD-10-CM | POA: Diagnosis not present

## 2014-12-26 DIAGNOSIS — M4856XA Collapsed vertebra, not elsewhere classified, lumbar region, initial encounter for fracture: Secondary | ICD-10-CM | POA: Insufficient documentation

## 2014-12-26 DIAGNOSIS — R531 Weakness: Secondary | ICD-10-CM | POA: Insufficient documentation

## 2014-12-26 DIAGNOSIS — N183 Chronic kidney disease, stage 3 (moderate): Secondary | ICD-10-CM | POA: Diagnosis not present

## 2014-12-26 DIAGNOSIS — I2510 Atherosclerotic heart disease of native coronary artery without angina pectoris: Secondary | ICD-10-CM | POA: Diagnosis not present

## 2014-12-26 DIAGNOSIS — M4716 Other spondylosis with myelopathy, lumbar region: Secondary | ICD-10-CM | POA: Insufficient documentation

## 2014-12-26 DIAGNOSIS — E1122 Type 2 diabetes mellitus with diabetic chronic kidney disease: Secondary | ICD-10-CM | POA: Diagnosis not present

## 2014-12-26 DIAGNOSIS — A1801 Tuberculosis of spine: Secondary | ICD-10-CM | POA: Diagnosis not present

## 2014-12-27 DIAGNOSIS — I2510 Atherosclerotic heart disease of native coronary artery without angina pectoris: Secondary | ICD-10-CM | POA: Diagnosis not present

## 2014-12-27 DIAGNOSIS — E1122 Type 2 diabetes mellitus with diabetic chronic kidney disease: Secondary | ICD-10-CM | POA: Diagnosis not present

## 2014-12-27 DIAGNOSIS — N183 Chronic kidney disease, stage 3 (moderate): Secondary | ICD-10-CM | POA: Diagnosis not present

## 2014-12-27 DIAGNOSIS — I129 Hypertensive chronic kidney disease with stage 1 through stage 4 chronic kidney disease, or unspecified chronic kidney disease: Secondary | ICD-10-CM | POA: Diagnosis not present

## 2014-12-27 DIAGNOSIS — A1801 Tuberculosis of spine: Secondary | ICD-10-CM | POA: Diagnosis not present

## 2014-12-30 DIAGNOSIS — I129 Hypertensive chronic kidney disease with stage 1 through stage 4 chronic kidney disease, or unspecified chronic kidney disease: Secondary | ICD-10-CM | POA: Diagnosis not present

## 2014-12-30 DIAGNOSIS — N183 Chronic kidney disease, stage 3 (moderate): Secondary | ICD-10-CM | POA: Diagnosis not present

## 2014-12-30 DIAGNOSIS — E1122 Type 2 diabetes mellitus with diabetic chronic kidney disease: Secondary | ICD-10-CM | POA: Diagnosis not present

## 2014-12-30 DIAGNOSIS — I2510 Atherosclerotic heart disease of native coronary artery without angina pectoris: Secondary | ICD-10-CM | POA: Diagnosis not present

## 2014-12-30 DIAGNOSIS — A1801 Tuberculosis of spine: Secondary | ICD-10-CM | POA: Diagnosis not present

## 2015-01-01 DIAGNOSIS — A1801 Tuberculosis of spine: Secondary | ICD-10-CM | POA: Diagnosis not present

## 2015-01-01 DIAGNOSIS — E1122 Type 2 diabetes mellitus with diabetic chronic kidney disease: Secondary | ICD-10-CM | POA: Diagnosis not present

## 2015-01-01 DIAGNOSIS — I2510 Atherosclerotic heart disease of native coronary artery without angina pectoris: Secondary | ICD-10-CM | POA: Diagnosis not present

## 2015-01-01 DIAGNOSIS — I129 Hypertensive chronic kidney disease with stage 1 through stage 4 chronic kidney disease, or unspecified chronic kidney disease: Secondary | ICD-10-CM | POA: Diagnosis not present

## 2015-01-01 DIAGNOSIS — N183 Chronic kidney disease, stage 3 (moderate): Secondary | ICD-10-CM | POA: Diagnosis not present

## 2015-01-01 NOTE — Patient Instructions (Addendum)
[place]  01/01/2015   Your procedure is scheduled on:   -01-06-2015 Monday  Enter through [facility]  Entrance and follow signs to [facility]. Arrive at      1000  AM.  (Limit 1 person with you).  Call this number if you have problems the morning of surgery: [phone]  Or Presurgical Testing [phone].   For Living Will and/or Health Care Power Attorney Forms: please provide copy for your medical record,may bring AM of surgery(Forms should be already notarized -we do not provide this service).(01-02-15 No information preferred today).  Remember: Follow any bowel prep instructions per MD office.    Do not eat food/ or drink: After Midnight.      Take these medicines the morning of surgery with A SIP OF WATER: Clonidine. Levothyroxine. Metoprolol. Pantoprazole.   Do not wear jewelry, make-up or nail polish.  Do not wear deodorant, lotions, powders, or perfumes.   Do not shave legs and under arms- 48 hours(2 days) prior to first CHG shower.(Shaving face and neck okay.)  Do not bring valuables to the hospital.(Hospital is not responsible for lost valuables).  Contacts, dentures or removable bridgework, body piercing, hair pins may not be worn into surgery.  Leave suitcase in the car. After surgery it may be brought to your room.  For patients admitted to the hospital, checkout time is 11:00 AM the day of discharge.(Restricted visitors-Any Persons displaying flu-like symptoms or illness).    Patients discharged the day of surgery will not be allowed to drive home. Must have responsible person with you x 24 hours once discharged.  Name and phone number of your driver: Corretta Sala, spouse [phone] cell     Please read over the following fact sheets that you were given:  CHG(Chlorhexidine Gluconate 4% Surgical Soap) use.           [facility] - Preparing for Surgery Before surgery, you can play an important role.  Because skin is not sterile, your skin  needs to be as free of germs as possible.  You can reduce the number of germs on your skin by washing with CHG (chlorahexidine gluconate) soap before surgery.  CHG is an antiseptic cleaner which kills germs and bonds with the skin to continue killing germs even after washing. Please DO NOT use if you have an allergy to CHG or antibacterial soaps.  If your skin becomes reddened/irritated stop using the CHG and inform your nurse when you arrive at Short Stay. Do not shave (including legs and underarms) for at least 48 hours prior to the first CHG shower.  You may shave your face/neck. Please follow these instructions carefully:  1.  Shower with CHG Soap the night before surgery and the  morning of Surgery.  2.  If you choose to wash your hair, wash your hair first as usual with your  normal  shampoo.  3.  After you shampoo, rinse your hair and body thoroughly to remove the  shampoo.                           4.  Use CHG as you would any other liquid soap.  You can apply chg directly  to the skin and wash                       Gently with a scrungie or clean washcloth.  5.  Apply the CHG  Soap to your body ONLY FROM THE NECK DOWN.   Do not use on face/ open                           Wound or open sores. Avoid contact with eyes, ears mouth and genitals (private parts).                       Wash face,  Genitals (private parts) with your normal soap.             6.  Wash thoroughly, paying special attention to the area where your surgery  will be performed.  7.  Thoroughly rinse your body with warm water from the neck down.  8.  DO NOT shower/wash with your normal soap after using and rinsing off  the CHG Soap.                9.  Pat yourself dry with a clean towel.            10.  Wear clean pajamas.            11.  Place clean sheets on your bed the night of your first shower and do not  sleep with pets. Day of Surgery : Do not apply any lotions/deodorants the morning of surgery.  Please wear clean  clothes to the hospital/surgery center.  FAILURE TO FOLLOW THESE INSTRUCTIONS MAY RESULT IN THE CANCELLATION OF YOUR SURGERY PATIENT SIGNATURE_________________________________  NURSE SIGNATURE__________________________________  ________________________________________________________________________

## 2015-01-02 ENCOUNTER — Encounter (HOSPITAL_COMMUNITY): Payer: Self-pay

## 2015-01-02 ENCOUNTER — Encounter (HOSPITAL_COMMUNITY)
Admission: RE | Admit: 2015-01-02 | Discharge: 2015-01-02 | Disposition: A | Discharge status: Home / Self Care | Payer: Medicare Other | Source: Ambulatory Visit | Attending: General Surgery | Admitting: General Surgery

## 2015-01-02 DIAGNOSIS — Z01818 Encounter for other preprocedural examination: Secondary | ICD-10-CM | POA: Insufficient documentation

## 2015-01-02 DIAGNOSIS — K603 Anal fistula: Secondary | ICD-10-CM | POA: Diagnosis not present

## 2015-01-02 LAB — CBC WITH DIFFERENTIAL/PLATELET
Basophils Absolute: 0 10*3/uL (ref 0.0–0.1)
Basophils Relative: 0 % (ref 0–1)
Eosinophils Absolute: 0.2 10*3/uL (ref 0.0–0.7)
Eosinophils Relative: 2 % (ref 0–5)
HCT: 37 % (ref 36.0–46.0)
Hemoglobin: 11.6 g/dL — ABNORMAL LOW (ref 12.0–15.0)
LYMPHS PCT: 26 % (ref 12–46)
Lymphs Abs: 2 10*3/uL (ref 0.7–4.0)
MCH: 30.4 pg (ref 26.0–34.0)
MCHC: 31.4 g/dL (ref 30.0–36.0)
MCV: 97.1 fL (ref 78.0–100.0)
Monocytes Absolute: 0.6 10*3/uL (ref 0.1–1.0)
Monocytes Relative: 8 % (ref 3–12)
Neutro Abs: 4.9 10*3/uL (ref 1.7–7.7)
Neutrophils Relative %: 64 % (ref 43–77)
Platelets: 180 10*3/uL (ref 150–400)
RBC: 3.81 MIL/uL — AB (ref 3.87–5.11)
RDW: 15.8 % — ABNORMAL HIGH (ref 11.5–15.5)
WBC: 7.6 10*3/uL (ref 4.0–10.5)

## 2015-01-02 LAB — COMPREHENSIVE METABOLIC PANEL
ALK PHOS: 104 U/L (ref 38–126)
ALT: 10 U/L — ABNORMAL LOW (ref 14–54)
AST: 21 U/L (ref 15–41)
Albumin: 3.7 g/dL (ref 3.5–5.0)
Anion gap: 9 (ref 5–15)
BUN: 32 mg/dL — ABNORMAL HIGH (ref 6–20)
CALCIUM: 9.4 mg/dL (ref 8.9–10.3)
CO2: 22 mmol/L (ref 22–32)
Chloride: 108 mmol/L (ref 101–111)
Creatinine, Ser: 1.59 mg/dL — ABNORMAL HIGH (ref 0.44–1.00)
GFR calc non Af Amer: 32 mL/min — ABNORMAL LOW (ref >60)
GFR, EST AFRICAN AMERICAN: 37 mL/min — AB (ref >60)
GLUCOSE: 184 mg/dL — AB (ref 65–99)
POTASSIUM: 4.9 mmol/L (ref 3.5–5.1)
SODIUM: 139 mmol/L (ref 135–145)
TOTAL PROTEIN: 7.1 g/dL (ref 6.5–8.1)
Total Bilirubin: 0.3 mg/dL (ref 0.3–1.2)

## 2015-01-02 LAB — PROTIME-INR
INR: 1.05 (ref 0.00–1.49)
Prothrombin Time: 13.9 seconds (ref 11.6–15.2)

## 2015-01-02 NOTE — Progress Notes (Signed)
01-02-15 1550 labs viewable in Epic- please note CMP results.

## 2015-01-02 NOTE — Pre-Procedure Instructions (Addendum)
01-02-15 1550 Labs noted and note sent per Epic fax  to Dr. Zella Richer office.

## 2015-01-03 DIAGNOSIS — I2510 Atherosclerotic heart disease of native coronary artery without angina pectoris: Secondary | ICD-10-CM | POA: Diagnosis not present

## 2015-01-03 DIAGNOSIS — N183 Chronic kidney disease, stage 3 (moderate): Secondary | ICD-10-CM | POA: Diagnosis not present

## 2015-01-03 DIAGNOSIS — E1122 Type 2 diabetes mellitus with diabetic chronic kidney disease: Secondary | ICD-10-CM | POA: Diagnosis not present

## 2015-01-03 DIAGNOSIS — I129 Hypertensive chronic kidney disease with stage 1 through stage 4 chronic kidney disease, or unspecified chronic kidney disease: Secondary | ICD-10-CM | POA: Diagnosis not present

## 2015-01-03 DIAGNOSIS — A1801 Tuberculosis of spine: Secondary | ICD-10-CM | POA: Diagnosis not present

## 2015-01-06 ENCOUNTER — Ambulatory Visit (HOSPITAL_COMMUNITY): Payer: Medicare Other | Admitting: Anesthesiology

## 2015-01-06 ENCOUNTER — Encounter (HOSPITAL_COMMUNITY): Payer: Self-pay | Admitting: *Deleted

## 2015-01-06 ENCOUNTER — Telehealth: Payer: Self-pay | Admitting: *Deleted

## 2015-01-06 ENCOUNTER — Encounter (HOSPITAL_COMMUNITY)
Admission: RE | Disposition: A | Discharge status: Home / Self Care | Payer: Self-pay | Source: Ambulatory Visit | Attending: General Surgery

## 2015-01-06 ENCOUNTER — Other Ambulatory Visit: Payer: Self-pay | Admitting: *Deleted

## 2015-01-06 ENCOUNTER — Ambulatory Visit (HOSPITAL_COMMUNITY)
Admission: RE | Admit: 2015-01-06 | Discharge: 2015-01-06 | Disposition: A | Discharge status: Home / Self Care | Payer: Medicare Other | Source: Ambulatory Visit | Attending: General Surgery | Admitting: General Surgery

## 2015-01-06 DIAGNOSIS — S31000A Unspecified open wound of lower back and pelvis without penetration into retroperitoneum, initial encounter: Secondary | ICD-10-CM | POA: Insufficient documentation

## 2015-01-06 DIAGNOSIS — E039 Hypothyroidism, unspecified: Secondary | ICD-10-CM | POA: Insufficient documentation

## 2015-01-06 DIAGNOSIS — K602 Anal fissure, unspecified: Secondary | ICD-10-CM | POA: Insufficient documentation

## 2015-01-06 DIAGNOSIS — K603 Anal fistula: Secondary | ICD-10-CM | POA: Insufficient documentation

## 2015-01-06 DIAGNOSIS — I10 Essential (primary) hypertension: Secondary | ICD-10-CM | POA: Insufficient documentation

## 2015-01-06 DIAGNOSIS — M1990 Unspecified osteoarthritis, unspecified site: Secondary | ICD-10-CM | POA: Insufficient documentation

## 2015-01-06 DIAGNOSIS — X58XXXA Exposure to other specified factors, initial encounter: Secondary | ICD-10-CM | POA: Insufficient documentation

## 2015-01-06 DIAGNOSIS — Z6830 Body mass index (BMI) 30.0-30.9, adult: Secondary | ICD-10-CM | POA: Diagnosis not present

## 2015-01-06 DIAGNOSIS — I2510 Atherosclerotic heart disease of native coronary artery without angina pectoris: Secondary | ICD-10-CM | POA: Diagnosis not present

## 2015-01-06 DIAGNOSIS — Z79899 Other long term (current) drug therapy: Secondary | ICD-10-CM | POA: Insufficient documentation

## 2015-01-06 DIAGNOSIS — M48061 Spinal stenosis, lumbar region without neurogenic claudication: Secondary | ICD-10-CM

## 2015-01-06 DIAGNOSIS — K219 Gastro-esophageal reflux disease without esophagitis: Secondary | ICD-10-CM | POA: Diagnosis not present

## 2015-01-06 HISTORY — PX: EVALUATION UNDER ANESTHESIA WITH FISTULECTOMY: SHX5623

## 2015-01-06 LAB — GLUCOSE, CAPILLARY
GLUCOSE-CAPILLARY: 170 mg/dL — AB (ref 65–99)
Glucose-Capillary: 189 mg/dL — ABNORMAL HIGH (ref 65–99)

## 2015-01-06 SURGERY — EXAM UNDER ANESTHESIA WITH FISTULECTOMY
Anesthesia: General

## 2015-01-06 MED ORDER — HYDROGEN PEROXIDE 3 % EX SOLN
CUTANEOUS | Status: DC | PRN
  Administered 2015-01-06: 1

## 2015-01-06 MED ORDER — FENTANYL CITRATE (PF) 100 MCG/2ML IJ SOLN
INTRAMUSCULAR | Status: AC
  Filled 2015-01-06: qty 4

## 2015-01-06 MED ORDER — PROPOFOL 10 MG/ML IV BOLUS
INTRAVENOUS | Status: AC
  Filled 2015-01-06: qty 20

## 2015-01-06 MED ORDER — BUPIVACAINE-EPINEPHRINE (PF) 0.5% -1:200000 IJ SOLN
INTRAMUSCULAR | Status: DC | PRN
  Administered 2015-01-06: 38 mL

## 2015-01-06 MED ORDER — DEXTROSE 5 % IV SOLN
2.0000 g | INTRAVENOUS | Status: AC
  Administered 2015-01-06: 2 g via INTRAVENOUS

## 2015-01-06 MED ORDER — FENTANYL CITRATE (PF) 100 MCG/2ML IJ SOLN: 25.0000 ug | INTRAMUSCULAR | Status: DC | PRN

## 2015-01-06 MED ORDER — LIDOCAINE HCL (CARDIAC) 20 MG/ML IV SOLN
INTRAVENOUS | Status: AC
  Filled 2015-01-06: qty 5

## 2015-01-06 MED ORDER — LIDOCAINE HCL (CARDIAC) 20 MG/ML IV SOLN
INTRAVENOUS | Status: DC | PRN
  Administered 2015-01-06: 50 mg via INTRAVENOUS

## 2015-01-06 MED ORDER — OXYCODONE HCL 5 MG PO TABS: 5.0000 mg | ORAL_TABLET | ORAL | Status: DC | PRN

## 2015-01-06 MED ORDER — MIDAZOLAM HCL 2 MG/2ML IJ SOLN
INTRAMUSCULAR | Status: AC
  Filled 2015-01-06: qty 4

## 2015-01-06 MED ORDER — ONDANSETRON HCL 4 MG PO TABS: 4.0000 mg | ORAL_TABLET | Freq: Three times a day (TID) | ORAL | Status: DC | PRN

## 2015-01-06 MED ORDER — 0.9 % SODIUM CHLORIDE (POUR BTL) OPTIME
TOPICAL | Status: DC | PRN
  Administered 2015-01-06: 1000 mL

## 2015-01-06 MED ORDER — LACTATED RINGERS IV SOLN
INTRAVENOUS | Status: DC
  Administered 2015-01-06: 1000 mL via INTRAVENOUS

## 2015-01-06 MED ORDER — FENTANYL CITRATE (PF) 100 MCG/2ML IJ SOLN
INTRAMUSCULAR | Status: DC | PRN
  Administered 2015-01-06 (×2): 25 ug via INTRAVENOUS
  Administered 2015-01-06: 50 ug via INTRAVENOUS

## 2015-01-06 MED ORDER — PROMETHAZINE HCL 25 MG/ML IJ SOLN: 6.2500 mg | INTRAMUSCULAR | Status: DC | PRN

## 2015-01-06 MED ORDER — PROPOFOL 500 MG/50ML IV EMUL
INTRAVENOUS | Status: DC | PRN
  Administered 2015-01-06: 25 ug/kg/min via INTRAVENOUS

## 2015-01-06 MED ORDER — MEPERIDINE HCL 50 MG/ML IJ SOLN: 6.2500 mg | INTRAMUSCULAR | Status: DC | PRN

## 2015-01-06 MED ORDER — BUPIVACAINE-EPINEPHRINE 0.5% -1:200000 IJ SOLN
INTRAMUSCULAR | Status: AC
  Filled 2015-01-06: qty 1

## 2015-01-06 MED ORDER — HYDROCODONE-ACETAMINOPHEN 5-325 MG PO TABS: 1.0000 | ORAL_TABLET | ORAL | Status: DC | PRN

## 2015-01-06 MED ORDER — HYDROGEN PEROXIDE 3 % EX SOLN
CUTANEOUS | Status: AC
Start: 2015-01-06 — End: 2015-01-06
  Filled 2015-01-06: qty 473

## 2015-01-06 MED ORDER — BUPIVACAINE HCL (PF) 0.25 % IJ SOLN
INTRAMUSCULAR | Status: AC
  Filled 2015-01-06: qty 30

## 2015-01-06 MED ORDER — MIDAZOLAM HCL 5 MG/5ML IJ SOLN
INTRAMUSCULAR | Status: DC | PRN
  Administered 2015-01-06: 2 mg via INTRAVENOUS

## 2015-01-06 MED ORDER — ACETAMINOPHEN 650 MG RE SUPP
650.0000 mg | RECTAL | Status: DC | PRN
Start: 2015-01-06 — End: 2015-01-06
  Filled 2015-01-06: qty 1

## 2015-01-06 MED ORDER — ACETAMINOPHEN 325 MG PO TABS: 650.0000 mg | ORAL_TABLET | ORAL | Status: DC | PRN

## 2015-01-06 MED ORDER — DEXTROSE 5 % IV SOLN
INTRAVENOUS | Status: AC
  Filled 2015-01-06: qty 2

## 2015-01-06 MED ORDER — BUPIVACAINE LIPOSOME 1.3 % IJ SUSP
20.0000 mL | Freq: Once | INTRAMUSCULAR | Status: AC
  Administered 2015-01-06: 20 mL
  Filled 2015-01-06: qty 20

## 2015-01-06 MED ORDER — SODIUM CHLORIDE 0.9 % IJ SOLN: 3.0000 mL | INTRAMUSCULAR | Status: DC | PRN

## 2015-01-06 SURGICAL SUPPLY — 38 items
BLADE HEX COATED 2.75 (ELECTRODE) ×2 IMPLANT
BLADE SURG 15 STRL LF DISP TIS (BLADE) ×1 IMPLANT
BLADE SURG 15 STRL SS (BLADE) ×1
BRIEF STRETCH FOR OB PAD LRG (UNDERPADS AND DIAPERS) ×2 IMPLANT
COVER SURGICAL LIGHT HANDLE (MISCELLANEOUS) IMPLANT
DECANTER SPIKE VIAL GLASS SM (MISCELLANEOUS) ×2 IMPLANT
DRAPE LAPAROTOMY T 102X78X121 (DRAPES) ×2 IMPLANT
DRSG PAD ABDOMINAL 8X10 ST (GAUZE/BANDAGES/DRESSINGS) IMPLANT
ELECT PENCIL ROCKER SW 15FT (MISCELLANEOUS) ×2 IMPLANT
ELECT REM PT RETURN 9FT ADLT (ELECTROSURGICAL) ×2
ELECTRODE REM PT RTRN 9FT ADLT (ELECTROSURGICAL) ×1 IMPLANT
GAUZE SPONGE 4X4 12PLY STRL (GAUZE/BANDAGES/DRESSINGS) IMPLANT
GAUZE SPONGE 4X4 16PLY XRAY LF (GAUZE/BANDAGES/DRESSINGS) ×2 IMPLANT
GLOVE BIOGEL PI IND STRL 7.0 (GLOVE) ×2 IMPLANT
GLOVE BIOGEL PI INDICATOR 7.0 (GLOVE) ×2
GOWN STRL REUS W/TWL LRG LVL3 (GOWN DISPOSABLE) ×4 IMPLANT
GOWN STRL REUS W/TWL XL LVL3 (GOWN DISPOSABLE) ×4 IMPLANT
KIT BASIN OR (CUSTOM PROCEDURE TRAY) ×2 IMPLANT
LOOP VESSEL MAXI BLUE (MISCELLANEOUS) ×2 IMPLANT
LUBRICANT JELLY K Y 4OZ (MISCELLANEOUS) ×2 IMPLANT
NDL SAFETY ECLIPSE 18X1.5 (NEEDLE) IMPLANT
NEEDLE HYPO 18GX1.5 SHARP (NEEDLE)
NEEDLE HYPO 25X1 1.5 SAFETY (NEEDLE) ×2 IMPLANT
NS IRRIG 1000ML POUR BTL (IV SOLUTION) ×2 IMPLANT
PACK BASIC (CUSTOM PROCEDURE TRAY) ×2 IMPLANT
PACK LITHOTOMY IV (CUSTOM PROCEDURE TRAY) IMPLANT
SPONGE LAP 18X18 X RAY DECT (DISPOSABLE) ×2 IMPLANT
SPONGE SURGIFOAM ABS GEL 100 (HEMOSTASIS) IMPLANT
SPONGE SURGIFOAM ABS GEL 12-7 (HEMOSTASIS) IMPLANT
SUT CHROMIC 2 0 SH (SUTURE) IMPLANT
SUT CHROMIC 3 0 SH 27 (SUTURE) IMPLANT
SUT ETHIBOND CT1 BRD #0 30IN (SUTURE) ×2 IMPLANT
SYR 30ML LL (SYRINGE) ×2 IMPLANT
SYR CONTROL 10ML LL (SYRINGE) ×2 IMPLANT
SYRINGE 20CC LL (MISCELLANEOUS) ×2 IMPLANT
TOWEL OR 17X26 10 PK STRL BLUE (TOWEL DISPOSABLE) ×2 IMPLANT
UNDERPAD 30X30 INCONTINENT (UNDERPADS AND DIAPERS) IMPLANT
YANKAUER SUCT BULB TIP 10FT TU (MISCELLANEOUS) ×2 IMPLANT

## 2015-01-06 NOTE — Transfer of Care (Signed)
Immediate Anesthesia Transfer of Care Note  Patient: [name]  Procedure(s) Performed: Procedure(s): EXAM UNDER ANESTHESIA , placement of seton (N/A)  Patient Location: PACU  Anesthesia Type:MAC  Level of Consciousness: awake, alert  and oriented  Airway & Oxygen Therapy: Patient Spontanous Breathing and Patient connected to nasal cannula oxygen  Post-op Assessment: Report given to RN  Post vital signs: Reviewed and stable  Last Vitals:  Filed Vitals:   01/06/15 0921  BP: 176/57  Pulse: 63  Temp: 36.6 C  Resp: 18    Complications: No apparent anesthesia complications

## 2015-01-06 NOTE — Telephone Encounter (Signed)
REFERRAL FAXED TO DR O'TOOLE FOR [facility]

## 2015-01-06 NOTE — Op Note (Signed)
Operative Note  [name] female 69 y.o. 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         Surgeon: Odis Hollingshead   Assistants: Leighton Ruff M.D.  Anesthesia: Monitored Local Anesthesia with Sedation  Indications:   This is a 69 year old female with a long-standing nonhealing posterior sacral wound that she reports came from a pressure sore. MRI demonstrates an anal fistula at the 7:00 position. She now presents for exam under anesthesia.    Procedure Detail:  She was brought to the operating room and placed prone on the operating table and given intravenous sedation. The buttocks were taped and retracted laterally. She was placed in the jackknife position. The perianal area and posterior wound were sterilely prepped and draped.  A perianal block was performed using half percent Marcaine with epinephrine.  Digital rectal exam demonstrated a slight defect at the 7:00 position in the anoderm. An anterior anal fissure was also noted. Anoscopy was performed and no masses were noted. Using an S shaped probe, the probe was placed into the open wound but it was difficult to find the tract. Hydrogen peroxide was then injected into the posterior wound and none definitely could be seen internally. The probe was then introduced slightly into the defect in the anus at the 7:00 position. The probe was then placed back into the wound and the fistula track was found with a thin layer of overlying mucosa. The probe was popped through the thin layer of overlying mucosa. Ethibond was tied around the probe and brought up through the posterior wound. A vessel loop was brought into the field. The Ethibond was tied to this and it was pulled back to the anus followed by a vessel loop. The vessel  loop was then tied together creating a noncutting seton.   Another anal block was performed with Exparel.  The open wound was then packed with saline moistened gauze. A bulky dressing was applied.  She tolerated the procedure well without any apparent complications and was taken to the recovery room in satisfactory condition.    Findings:  A small diameter fistula track was found at the 7:00 position. An anterior fissure was also found.  Estimated Blood Loss:  less than 100 mL         Drains: noncutting seton         Specimens: none        Complications:  * No complications entered in OR log *         Disposition: PACU - hemodynamically stable.         Condition: stable

## 2015-01-06 NOTE — Anesthesia Postprocedure Evaluation (Signed)
  Anesthesia Post-op Note  Patient: [name]  Procedure(s) Performed: Procedure(s) (LRB): EXAM UNDER ANESTHESIA , placement of seton (N/A)  Patient Location: PACU  Anesthesia Type: MAC  Level of Consciousness: awake and alert   Airway and Oxygen Therapy: Patient Spontanous Breathing  Post-op Pain: mild  Post-op Assessment: Post-op Vital signs reviewed, Patient's Cardiovascular Status Stable, Respiratory Function Stable, Patent Airway and No signs of Nausea or vomiting  Last Vitals:  Filed Vitals:   01/06/15 1400  BP: 170/68  Pulse: 64  Temp: 36.7 C  Resp: 16    Post-op Vital Signs: stable   Complications: No apparent anesthesia complications

## 2015-01-06 NOTE — Anesthesia Procedure Notes (Signed)
Performed by: Bethena Roys T Oxygen Delivery Method: Nasal cannula Placement Confirmation: positive ETCO2 and CO2 detector

## 2015-01-06 NOTE — Progress Notes (Signed)
Cardizem ointment to be compounded at [facility]. Prescription will not be ready until 01/07/15. Phone number and address of pharmacy given to [employer]- husband.

## 2015-01-06 NOTE — Interval H&P Note (Signed)
History and Physical Interval Note:  01/06/2015 12:03 PM  [name]  has presented today for surgery, with the diagnosis of Anal Fistula  The various methods of treatment have been discussed with the patient and family. After consideration of risks, benefits and other options for treatment, the patient has consented to  Procedure(s): EXAM UNDER ANESTHESIA AND REPAIR OF FISTULA (N/A) as a surgical intervention .  The patient's history has been reviewed, patient examined, no change in status, stable for surgery.  I have reviewed the patient's chart and labs.  Questions were answered to the patient's satisfaction.     Jansel Vonstein Lenna Sciara

## 2015-01-06 NOTE — H&P (Signed)
[name]. [name] DOB: [birth year] Married / Language: English / Race: White Female  History of Present Illness  The patient is a 69 year old female   Note: She has a nonhealing posterior sacral wound that is just above the anus. 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 Have not been able to demonstrate this in the office.   Prior to Admission medications   Medication Sig Start Date End Date Taking? Authorizing Provider  atorvastatin (LIPITOR) 40 MG tablet Take 1 tablet (40 mg total) by mouth every evening. 10/11/14  Yes Mary-Margaret Hassell Done, FNP  calcitRIOL (ROCALTROL) 0.25 MCG capsule Take 1 capsule (0.25 mcg total) by mouth daily. 10/29/14  Yes Mary-Margaret Hassell Done, FNP  cloNIDine (CATAPRES) 0.1 MG tablet Take 1 tablet (0.1 mg total) by mouth 2 (two) times daily. 10/11/14  Yes Mary-Margaret Hassell Done, FNP  furosemide (LASIX) 40 MG tablet Take 1 tablet (40 mg total) by mouth every morning. 10/11/14  Yes Mary-Margaret Hassell Done, FNP  glimepiride (AMARYL) 4 MG tablet 1 po qam and 1/2 in afternoon 10/28/14  Yes Sharion Balloon, FNP  hydrALAZINE (APRESOLINE) 50 MG tablet Take 1 tablet (50 mg total) by mouth every 8 (eight) hours. Patient taking differently: Take 50 mg by mouth 3 (three) times daily.  05/23/14  Yes Nita Sells, MD  levothyroxine (SYNTHROID, LEVOTHROID) 75 MCG tablet Take 1 tablet (75 mcg total) by mouth daily before breakfast. 10/11/14  Yes Mary-Margaret Hassell Done, FNP  losartan (COZAAR) 100 MG tablet Take 1 tablet (100 mg total) by mouth every morning. 10/11/14  Yes Mary-Margaret Hassell Done, FNP  metoprolol succinate (TOPROL-XL) 50 MG 24 hr tablet Take 1 tablet (50 mg total) by mouth every morning. 10/11/14  Yes Mary-Margaret Hassell Done, FNP  pantoprazole (PROTONIX) 40 MG tablet Take 1 tablet (40 mg total) by mouth 2 (two) times daily. 10/11/14  Yes Mary-Margaret Hassell Done, FNP  potassium chloride SA (K-DUR,KLOR-CON) 20 MEQ tablet Take 1 tablet (20 mEq total) by  mouth 2 (two) times daily. 10/11/14  Yes Mary-Margaret Hassell Done, FNP  docusate sodium (COLACE) 100 MG capsule Take 1 capsule (100 mg total) by mouth every 12 (twelve) hours. Patient not taking: Reported on 12/26/2014 10/17/14   Milton Ferguson, MD  polyethylene glycol (GOLYTELY) 236 G solution Drink one 8 ounce glass every hour until you have a bowel movement Patient not taking: Reported on 12/26/2014 10/17/14   Milton Ferguson, MD     Physical Exam  The physical exam findings are as follows: Note:General-overweight female in no acute distress. Her son is with her.  Cardiovascular-regular rate and rhythm  Lungs-clear to auscultation  Anorectal-posterior sacral wound just superior to the posterior anus that is clean.    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  Plan: Exam under anesthesia with possible fistulotomy versus seton placement. The procedure and risks were discussed in detail with her and her son. The risks include but are not limited to bleeding, infection, failure of wound healing, anesthesia, incontinence, need for further surgery. They seem to understand this and would like to proceed. She will need to be in the prone position.    Signed by Odis Hollingshead, MD

## 2015-01-06 NOTE — Discharge Instructions (Addendum)
[facility] _______[facility]  RECTAL SURGERY POST OP INSTRUCTIONS: POST OP INSTRUCTIONS  Always review your discharge instruction sheet given to you by the facility where your surgery was performed. IF YOU HAVE DISABILITY OR FAMILY LEAVE FORMS, YOU MUST BRING THEM TO THE OFFICE FOR PROCESSING.   DO NOT GIVE THEM TO YOUR DOCTOR.  1. A  prescription for pain medication may be given to you upon discharge.  Take your pain medication as prescribed, if needed.  If narcotic pain medicine is not needed, then you may take acetaminophen (Tylenol) or ibuprofen (Advil) as needed. 2. Take your usually prescribed medications unless otherwise directed. 3. If you need a refill on your pain medication, please contact your pharmacy.  They will contact our office to request authorization. Prescriptions will not be filled after 5 pm or on week-ends. 4. You should follow a light diet the first 48 hours after arrival home, such as soup and crackers, etc.  Be sure to include lots of fluids daily.  Resume your normal diet 2-3 days after surgery.. 5. Most patients will experience some swelling and discomfort in the rectal area. Ice packs, reclining and warm tub soaks will help.  Swelling and discomfort can take several days to resolve.  6. It is common to experience some constipation if taking pain medication after surgery.  Increasing fluid intake and taking a stool softener (such as Colace) will usually help or prevent this problem from occurring.  A mild laxative (Milk of Magnesia or Miralax) should be taken according to package directions if there are no bowel movements after 48 hours. 7. Unless discharge instructions indicate otherwise, leave your bandage dry and in place for 24 hours, or remove the bandage if you have a bowel movement. You may notice a small amount of bleeding with bowel movements for the first few days. You may have some packing in the rectum which will come out over the first day or two. You  will need to wear an absorbent pad or soft cotton gauze in your underwear until the drainage stops.it. 8. ACTIVITIES:  You may resume regular (light) daily activities beginning the next day--such as daily self-care, walking, climbing stairs--gradually increasing activities as tolerated.  You may have sexual intercourse when it is comfortable.  Refrain from any heavy lifting or straining until approved by your doctor. a. You may drive when you are no longer taking prescription pain medication, you can comfortably wear a seatbelt, and you can safely maneuver your car and apply brakes. b. RETURN TO WORK: : ____________________ c.  9. You should see Dr. Zella Richer in the office for a follow-up appointment approximately 2-3 weeks after your surgery.  Make sure that you call for this appointment within a day or two after you arrive home to insure a convenient appointment time.  Also, please call and arrange for an appointment to see Dr. Migdalia Dk, the plastic surgeon. 10. OTHER INSTRUCTIONS:  ____Remove bandage tomorrow. Clean wound in the shower twice a day. Apply a dry pad in your underwear twice a day._Pick up the Cardizem cream for your anal fissure at [place] on [address].__________________________________________________________________________________________________________________________________________________________________________________  [place]: 1. Fever over 101.0 2. Inability to urinate 3. Nausea and/or vomiting 4. Extreme swelling or bruising 5. Continued bleeding from rectum. 6. Increased pain, redness, or drainage from the incision 7. Constipation  The clinic staff is available to answer your questions during regular business hours.  Please dont hesitate to call and ask  to speak to one of the nurses for clinical concerns.  If you have a medical emergency, go to the nearest emergency room or call 911.  A surgeon from [facility] is  always on call at the hospital   [address], [place], [place], [place]  [address] ?  [address] [place], [place], [place]   [address] [phone] ? [phone] ? FAX [phone] Web site: [email or web address]     General Anesthesia, Care After Refer to this sheet in the next few weeks. These instructions provide you with information on caring for yourself after your procedure. Your health care provider may also give you more specific instructions. Your treatment has been planned according to current medical practices, but problems sometimes occur. Call your health care provider if you have any problems or questions after your procedure. WHAT TO EXPECT AFTER THE PROCEDURE After the procedure, it is typical to experience: Sleepiness. Nausea and vomiting. HOME CARE INSTRUCTIONS For the first 24 hours after general anesthesia: Have a responsible person with you. Do not drive a car. If you are alone, do not take public transportation. Do not drink alcohol. Do not take medicine that has not been prescribed by your health care provider. Do not sign important papers or make important decisions. You may resume a normal diet and activities as directed by your health care provider. Change bandages (dressings) as directed. If you have questions or problems that seem related to general anesthesia, call the hospital and ask for the anesthetist or anesthesiologist on call. SEEK MEDICAL CARE IF: You have nausea and vomiting that continue the day after anesthesia. You develop a rash. SEEK IMMEDIATE MEDICAL CARE IF:  You have difficulty breathing. You have chest pain. You have any allergic problems. Document Released: 08/16/2000 Document Revised: 05/15/2013 Document Reviewed: 11/23/2012 [facility] Patient Information 2015 [place], [place]. This information is not intended to replace advice given to you by your health care provider. Make sure you discuss any questions you have with  your health care provider.

## 2015-01-06 NOTE — Anesthesia Preprocedure Evaluation (Addendum)
Anesthesia Evaluation  Patient identified by MRN, date of birth, ID band Patient awake    Reviewed: Allergy & Precautions, H&P , NPO status , Patient's Chart, lab work & pertinent test results, reviewed documented beta blocker date and time   History of Anesthesia Complications (+) DIFFICULT IV STICK / SPECIAL LINENegative for: history of anesthetic complications  Airway Mallampati: III  TM Distance: >3 FB Neck ROM: Limited    Dental  (+) Poor Dentition, Dental Advisory Given, Teeth Intact   Pulmonary neg pulmonary ROS,  breath sounds clear to auscultation  Pulmonary exam normal       Cardiovascular Exercise Tolerance: Poor hypertension, Pt. on medications and Pt. on home beta blockers + CAD + dysrhythmias Rhythm:Regular Rate:Normal  Nl ef echo 01/2011. Moderate pulm HTN  2006 Myoview: Negative for evidence of scar or ischemia. EF 66%.   Neuro/Psych PSYCHIATRIC DISORDERS Anxiety Depression negative neurological ROS     GI/Hepatic Neg liver ROS, GERD-  Medicated and Controlled,  Endo/Other  diabetes, Well Controlled, Type 2, Oral Hypoglycemic AgentsHypothyroidism Morbid obesity  Renal/GU Renal InsufficiencyRenal disease     Musculoskeletal  (+) Arthritis -, Osteoarthritis,    Abdominal   Peds  Hematology negative hematology ROS (+) anemia ,   Anesthesia Other Findings   Reproductive/Obstetrics                            Anesthesia Physical  Anesthesia Plan  ASA: III  Anesthesia Plan: General   Post-op Pain Management:    Induction:   Airway Management Planned: Oral ETT  Additional Equipment:   Intra-op Plan:   Post-operative Plan: Extubation in OR  Informed Consent: I have reviewed the patients History and Physical, chart, labs and discussed the procedure including the risks, benefits and alternatives for the proposed anesthesia with the patient or authorized representative who  has indicated his/her understanding and acceptance.   Dental advisory given  Plan Discussed with: CRNA  Anesthesia Plan Comments:        Anesthesia Quick Evaluation

## 2015-01-07 ENCOUNTER — Encounter (HOSPITAL_COMMUNITY): Payer: Self-pay | Admitting: General Surgery

## 2015-01-10 ENCOUNTER — Encounter: Payer: Self-pay | Admitting: Internal Medicine

## 2015-01-11 DIAGNOSIS — E119 Type 2 diabetes mellitus without complications: Secondary | ICD-10-CM | POA: Diagnosis not present

## 2015-01-11 DIAGNOSIS — M5136 Other intervertebral disc degeneration, lumbar region: Secondary | ICD-10-CM | POA: Diagnosis not present

## 2015-01-11 DIAGNOSIS — M1990 Unspecified osteoarthritis, unspecified site: Secondary | ICD-10-CM | POA: Diagnosis not present

## 2015-01-11 DIAGNOSIS — I10 Essential (primary) hypertension: Secondary | ICD-10-CM | POA: Diagnosis not present

## 2015-01-11 DIAGNOSIS — R32 Unspecified urinary incontinence: Secondary | ICD-10-CM | POA: Diagnosis not present

## 2015-01-11 DIAGNOSIS — K603 Anal fistula: Secondary | ICD-10-CM | POA: Diagnosis not present

## 2015-01-13 DIAGNOSIS — K603 Anal fistula: Secondary | ICD-10-CM | POA: Diagnosis not present

## 2015-01-13 DIAGNOSIS — M1990 Unspecified osteoarthritis, unspecified site: Secondary | ICD-10-CM | POA: Diagnosis not present

## 2015-01-13 DIAGNOSIS — R32 Unspecified urinary incontinence: Secondary | ICD-10-CM | POA: Diagnosis not present

## 2015-01-13 DIAGNOSIS — E119 Type 2 diabetes mellitus without complications: Secondary | ICD-10-CM | POA: Diagnosis not present

## 2015-01-13 DIAGNOSIS — M5136 Other intervertebral disc degeneration, lumbar region: Secondary | ICD-10-CM | POA: Diagnosis not present

## 2015-01-13 DIAGNOSIS — I10 Essential (primary) hypertension: Secondary | ICD-10-CM | POA: Diagnosis not present

## 2015-01-13 NOTE — Telephone Encounter (Signed)
[name] is calling stating that she hasnt heard from Dr. Ellouise Newer office, please advise?

## 2015-01-13 NOTE — Telephone Encounter (Signed)
This is still in review and they will call patient with appt

## 2015-01-13 NOTE — Telephone Encounter (Signed)
Patient aware.

## 2015-01-15 DIAGNOSIS — K603 Anal fistula: Secondary | ICD-10-CM | POA: Diagnosis not present

## 2015-01-15 DIAGNOSIS — M1990 Unspecified osteoarthritis, unspecified site: Secondary | ICD-10-CM | POA: Diagnosis not present

## 2015-01-15 DIAGNOSIS — M5136 Other intervertebral disc degeneration, lumbar region: Secondary | ICD-10-CM | POA: Diagnosis not present

## 2015-01-15 DIAGNOSIS — E119 Type 2 diabetes mellitus without complications: Secondary | ICD-10-CM | POA: Diagnosis not present

## 2015-01-15 DIAGNOSIS — R32 Unspecified urinary incontinence: Secondary | ICD-10-CM | POA: Diagnosis not present

## 2015-01-15 DIAGNOSIS — I10 Essential (primary) hypertension: Secondary | ICD-10-CM | POA: Diagnosis not present

## 2015-01-15 NOTE — Telephone Encounter (Signed)
appt 01/24/15 2:30p

## 2015-01-17 DIAGNOSIS — I10 Essential (primary) hypertension: Secondary | ICD-10-CM | POA: Diagnosis not present

## 2015-01-17 DIAGNOSIS — E119 Type 2 diabetes mellitus without complications: Secondary | ICD-10-CM | POA: Diagnosis not present

## 2015-01-17 DIAGNOSIS — M5136 Other intervertebral disc degeneration, lumbar region: Secondary | ICD-10-CM | POA: Diagnosis not present

## 2015-01-17 DIAGNOSIS — R32 Unspecified urinary incontinence: Secondary | ICD-10-CM | POA: Diagnosis not present

## 2015-01-17 DIAGNOSIS — M1990 Unspecified osteoarthritis, unspecified site: Secondary | ICD-10-CM | POA: Diagnosis not present

## 2015-01-17 DIAGNOSIS — K603 Anal fistula: Secondary | ICD-10-CM | POA: Diagnosis not present

## 2015-01-20 DIAGNOSIS — M1990 Unspecified osteoarthritis, unspecified site: Secondary | ICD-10-CM | POA: Diagnosis not present

## 2015-01-20 DIAGNOSIS — K603 Anal fistula: Secondary | ICD-10-CM | POA: Diagnosis not present

## 2015-01-20 DIAGNOSIS — E11359 Type 2 diabetes mellitus with proliferative diabetic retinopathy without macular edema: Secondary | ICD-10-CM | POA: Diagnosis not present

## 2015-01-20 DIAGNOSIS — M5136 Other intervertebral disc degeneration, lumbar region: Secondary | ICD-10-CM | POA: Diagnosis not present

## 2015-01-20 DIAGNOSIS — E119 Type 2 diabetes mellitus without complications: Secondary | ICD-10-CM | POA: Diagnosis not present

## 2015-01-20 DIAGNOSIS — E11311 Type 2 diabetes mellitus with unspecified diabetic retinopathy with macular edema: Secondary | ICD-10-CM | POA: Diagnosis not present

## 2015-01-20 DIAGNOSIS — R32 Unspecified urinary incontinence: Secondary | ICD-10-CM | POA: Diagnosis not present

## 2015-01-20 DIAGNOSIS — I10 Essential (primary) hypertension: Secondary | ICD-10-CM | POA: Diagnosis not present

## 2015-01-20 LAB — HM DIABETES EYE EXAM

## 2015-01-22 DIAGNOSIS — R32 Unspecified urinary incontinence: Secondary | ICD-10-CM | POA: Diagnosis not present

## 2015-01-22 DIAGNOSIS — M5136 Other intervertebral disc degeneration, lumbar region: Secondary | ICD-10-CM | POA: Diagnosis not present

## 2015-01-22 DIAGNOSIS — E119 Type 2 diabetes mellitus without complications: Secondary | ICD-10-CM | POA: Diagnosis not present

## 2015-01-22 DIAGNOSIS — K603 Anal fistula: Secondary | ICD-10-CM | POA: Diagnosis not present

## 2015-01-22 DIAGNOSIS — I10 Essential (primary) hypertension: Secondary | ICD-10-CM | POA: Diagnosis not present

## 2015-01-22 DIAGNOSIS — M1990 Unspecified osteoarthritis, unspecified site: Secondary | ICD-10-CM | POA: Diagnosis not present

## 2015-01-23 ENCOUNTER — Encounter: Payer: Self-pay | Admitting: *Deleted

## 2015-01-23 ENCOUNTER — Inpatient Hospital Stay (HOSPITAL_COMMUNITY)
Admission: EM | Admit: 2015-01-23 | Discharge: 2015-01-24 | DRG: 637 | Disposition: A | Discharge status: Home Health | Payer: Medicare Other | Attending: Internal Medicine | Admitting: Internal Medicine

## 2015-01-23 ENCOUNTER — Encounter (HOSPITAL_COMMUNITY): Payer: Self-pay | Admitting: Emergency Medicine

## 2015-01-23 DIAGNOSIS — E876 Hypokalemia: Secondary | ICD-10-CM

## 2015-01-23 DIAGNOSIS — R609 Edema, unspecified: Secondary | ICD-10-CM

## 2015-01-23 DIAGNOSIS — Z8249 Family history of ischemic heart disease and other diseases of the circulatory system: Secondary | ICD-10-CM

## 2015-01-23 DIAGNOSIS — E1159 Type 2 diabetes mellitus with other circulatory complications: Secondary | ICD-10-CM

## 2015-01-23 DIAGNOSIS — N259 Disorder resulting from impaired renal tubular function, unspecified: Secondary | ICD-10-CM | POA: Diagnosis present

## 2015-01-23 DIAGNOSIS — G9340 Encephalopathy, unspecified: Secondary | ICD-10-CM | POA: Diagnosis present

## 2015-01-23 DIAGNOSIS — K219 Gastro-esophageal reflux disease without esophagitis: Secondary | ICD-10-CM | POA: Diagnosis present

## 2015-01-23 DIAGNOSIS — E669 Obesity, unspecified: Secondary | ICD-10-CM | POA: Diagnosis present

## 2015-01-23 DIAGNOSIS — I129 Hypertensive chronic kidney disease with stage 1 through stage 4 chronic kidney disease, or unspecified chronic kidney disease: Secondary | ICD-10-CM | POA: Diagnosis present

## 2015-01-23 DIAGNOSIS — Z800 Family history of malignant neoplasm of digestive organs: Secondary | ICD-10-CM

## 2015-01-23 DIAGNOSIS — E11649 Type 2 diabetes mellitus with hypoglycemia without coma: Principal | ICD-10-CM | POA: Diagnosis present

## 2015-01-23 DIAGNOSIS — F411 Generalized anxiety disorder: Secondary | ICD-10-CM | POA: Diagnosis present

## 2015-01-23 DIAGNOSIS — E785 Hyperlipidemia, unspecified: Secondary | ICD-10-CM

## 2015-01-23 DIAGNOSIS — D631 Anemia in chronic kidney disease: Secondary | ICD-10-CM | POA: Diagnosis present

## 2015-01-23 DIAGNOSIS — E039 Hypothyroidism, unspecified: Secondary | ICD-10-CM | POA: Diagnosis present

## 2015-01-23 DIAGNOSIS — N183 Chronic kidney disease, stage 3 unspecified: Secondary | ICD-10-CM | POA: Diagnosis present

## 2015-01-23 DIAGNOSIS — K3184 Gastroparesis: Secondary | ICD-10-CM | POA: Diagnosis present

## 2015-01-23 DIAGNOSIS — I2510 Atherosclerotic heart disease of native coronary artery without angina pectoris: Secondary | ICD-10-CM | POA: Diagnosis present

## 2015-01-23 DIAGNOSIS — Z6830 Body mass index (BMI) 30.0-30.9, adult: Secondary | ICD-10-CM

## 2015-01-23 DIAGNOSIS — E034 Atrophy of thyroid (acquired): Secondary | ICD-10-CM

## 2015-01-23 DIAGNOSIS — E1122 Type 2 diabetes mellitus with diabetic chronic kidney disease: Secondary | ICD-10-CM | POA: Diagnosis present

## 2015-01-23 DIAGNOSIS — E162 Hypoglycemia, unspecified: Secondary | ICD-10-CM | POA: Diagnosis not present

## 2015-01-23 DIAGNOSIS — I10 Essential (primary) hypertension: Secondary | ICD-10-CM | POA: Diagnosis present

## 2015-01-23 DIAGNOSIS — T383X5A Adverse effect of insulin and oral hypoglycemic [antidiabetic] drugs, initial encounter: Secondary | ICD-10-CM | POA: Diagnosis present

## 2015-01-23 DIAGNOSIS — Z794 Long term (current) use of insulin: Secondary | ICD-10-CM

## 2015-01-23 DIAGNOSIS — R112 Nausea with vomiting, unspecified: Secondary | ICD-10-CM | POA: Diagnosis present

## 2015-01-23 DIAGNOSIS — E1143 Type 2 diabetes mellitus with diabetic autonomic (poly)neuropathy: Secondary | ICD-10-CM | POA: Diagnosis present

## 2015-01-23 DIAGNOSIS — R40241 Glasgow coma scale score 13-15: Secondary | ICD-10-CM | POA: Diagnosis not present

## 2015-01-23 DIAGNOSIS — E11319 Type 2 diabetes mellitus with unspecified diabetic retinopathy without macular edema: Secondary | ICD-10-CM | POA: Diagnosis present

## 2015-01-23 DIAGNOSIS — K2100 Gastro-esophageal reflux disease with esophagitis, without bleeding: Secondary | ICD-10-CM

## 2015-01-23 DIAGNOSIS — Z833 Family history of diabetes mellitus: Secondary | ICD-10-CM | POA: Diagnosis not present

## 2015-01-23 DIAGNOSIS — N189 Chronic kidney disease, unspecified: Secondary | ICD-10-CM

## 2015-01-23 DIAGNOSIS — I152 Hypertension secondary to endocrine disorders: Secondary | ICD-10-CM | POA: Diagnosis present

## 2015-01-23 LAB — GLUCOSE, CAPILLARY
GLUCOSE-CAPILLARY: 136 mg/dL — AB (ref 65–99)
GLUCOSE-CAPILLARY: 169 mg/dL — AB (ref 65–99)
GLUCOSE-CAPILLARY: 77 mg/dL (ref 65–99)

## 2015-01-23 LAB — URINALYSIS, ROUTINE W REFLEX MICROSCOPIC
BILIRUBIN URINE: NEGATIVE
Glucose, UA: NEGATIVE mg/dL
KETONES UR: NEGATIVE mg/dL
NITRITE: NEGATIVE
PROTEIN: NEGATIVE mg/dL
Specific Gravity, Urine: 1.025 (ref 1.005–1.030)
UROBILINOGEN UA: 0.2 mg/dL (ref 0.0–1.0)
pH: 5 (ref 5.0–8.0)

## 2015-01-23 LAB — COMPREHENSIVE METABOLIC PANEL
ALK PHOS: 86 U/L (ref 38–126)
ALT: 7 U/L — ABNORMAL LOW (ref 14–54)
ANION GAP: 10 (ref 5–15)
AST: 17 U/L (ref 15–41)
Albumin: 3.4 g/dL — ABNORMAL LOW (ref 3.5–5.0)
BILIRUBIN TOTAL: 0.4 mg/dL (ref 0.3–1.2)
BUN: 27 mg/dL — AB (ref 6–20)
CALCIUM: 8.9 mg/dL (ref 8.9–10.3)
CO2: 21 mmol/L — ABNORMAL LOW (ref 22–32)
Chloride: 112 mmol/L — ABNORMAL HIGH (ref 101–111)
Creatinine, Ser: 1.48 mg/dL — ABNORMAL HIGH (ref 0.44–1.00)
GFR calc Af Amer: 41 mL/min — ABNORMAL LOW (ref >60)
GFR, EST NON AFRICAN AMERICAN: 35 mL/min — AB (ref >60)
Glucose, Bld: 156 mg/dL — ABNORMAL HIGH (ref 65–99)
POTASSIUM: 5 mmol/L (ref 3.5–5.1)
Sodium: 143 mmol/L (ref 135–145)
TOTAL PROTEIN: 6.6 g/dL (ref 6.5–8.1)

## 2015-01-23 LAB — CBC WITH DIFFERENTIAL/PLATELET
BASOS ABS: 0 10*3/uL (ref 0.0–0.1)
BASOS PCT: 0 % (ref 0–1)
EOS ABS: 0.1 10*3/uL (ref 0.0–0.7)
EOS PCT: 1 % (ref 0–5)
HCT: 35 % — ABNORMAL LOW (ref 36.0–46.0)
Hemoglobin: 11.2 g/dL — ABNORMAL LOW (ref 12.0–15.0)
LYMPHS PCT: 16 % (ref 12–46)
Lymphs Abs: 1.5 10*3/uL (ref 0.7–4.0)
MCH: 30.5 pg (ref 26.0–34.0)
MCHC: 32 g/dL (ref 30.0–36.0)
MCV: 95.4 fL (ref 78.0–100.0)
MONO ABS: 0.6 10*3/uL (ref 0.1–1.0)
Monocytes Relative: 7 % (ref 3–12)
Neutro Abs: 7 10*3/uL (ref 1.7–7.7)
Neutrophils Relative %: 76 % (ref 43–77)
PLATELETS: 201 10*3/uL (ref 150–400)
RBC: 3.67 MIL/uL — AB (ref 3.87–5.11)
RDW: 14.7 % (ref 11.5–15.5)
WBC: 9.3 10*3/uL (ref 4.0–10.5)

## 2015-01-23 LAB — CBG MONITORING, ED
GLUCOSE-CAPILLARY: 39 mg/dL — AB (ref 65–99)
GLUCOSE-CAPILLARY: 125 mg/dL — AB (ref 65–99)
GLUCOSE-CAPILLARY: 111 mg/dL — AB (ref 65–99)
GLUCOSE-CAPILLARY: 85 mg/dL (ref 65–99)
GLUCOSE-CAPILLARY: 87 mg/dL (ref 65–99)
Glucose-Capillary: 153 mg/dL — ABNORMAL HIGH (ref 65–99)

## 2015-01-23 LAB — URINE MICROSCOPIC-ADD ON

## 2015-01-23 LAB — TSH: TSH: 0.343 u[IU]/mL — AB (ref 0.350–4.500)

## 2015-01-23 MED ORDER — GLUCOSE 40 % PO GEL
ORAL | Status: AC
  Administered 2015-01-23: 37.5 g
  Filled 2015-01-23: qty 1

## 2015-01-23 MED ORDER — DEXTROSE 10 % IV SOLN
INTRAVENOUS | Status: AC
  Administered 2015-01-23: 09:00:00 via INTRAVENOUS
  Filled 2015-01-23: qty 1000

## 2015-01-23 MED ORDER — CLONIDINE HCL 0.1 MG PO TABS
0.1000 mg | ORAL_TABLET | Freq: Two times a day (BID) | ORAL | Status: DC
  Administered 2015-01-23 – 2015-01-24 (×3): 0.1 mg via ORAL
  Filled 2015-01-23 (×3): qty 1

## 2015-01-23 MED ORDER — ONDANSETRON HCL 4 MG/2ML IJ SOLN: 4.0000 mg | Freq: Three times a day (TID) | INTRAMUSCULAR | Status: DC | PRN

## 2015-01-23 MED ORDER — DEXTROSE 50 % IV SOLN
1.0000 | Freq: Once | INTRAVENOUS | Status: AC
  Administered 2015-01-23: 50 mL via INTRAVENOUS

## 2015-01-23 MED ORDER — ACETAMINOPHEN 650 MG RE SUPP: 650.0000 mg | Freq: Four times a day (QID) | RECTAL | Status: DC | PRN

## 2015-01-23 MED ORDER — DEXTROSE 50 % IV SOLN
INTRAVENOUS | Status: AC
  Administered 2015-01-23: 50 mL via INTRAVENOUS
  Filled 2015-01-23: qty 50

## 2015-01-23 MED ORDER — ONDANSETRON HCL 4 MG/2ML IJ SOLN
INTRAMUSCULAR | Status: AC
  Filled 2015-01-23: qty 2

## 2015-01-23 MED ORDER — LEVOTHYROXINE SODIUM 75 MCG PO TABS
75.0000 ug | ORAL_TABLET | Freq: Every day | ORAL | Status: DC
Start: 2015-01-23 — End: 2015-01-24
  Administered 2015-01-23 – 2015-01-24 (×2): 75 ug via ORAL
  Filled 2015-01-23 (×2): qty 1

## 2015-01-23 MED ORDER — ONDANSETRON HCL 4 MG/2ML IJ SOLN
4.0000 mg | Freq: Once | INTRAMUSCULAR | Status: AC
  Administered 2015-01-23: 4 mg via INTRAVENOUS
  Filled 2015-01-23: qty 2

## 2015-01-23 MED ORDER — DOCUSATE SODIUM 100 MG PO CAPS
100.0000 mg | ORAL_CAPSULE | Freq: Two times a day (BID) | ORAL | Status: DC
  Administered 2015-01-23 – 2015-01-24 (×3): 100 mg via ORAL
  Filled 2015-01-23 (×3): qty 1

## 2015-01-23 MED ORDER — ENOXAPARIN SODIUM 40 MG/0.4ML ~~LOC~~ SOLN
40.0000 mg | SUBCUTANEOUS | Status: DC
  Administered 2015-01-23: 40 mg via SUBCUTANEOUS
  Filled 2015-01-23: qty 0.4

## 2015-01-23 MED ORDER — DEXTROSE-NACL 5-0.45 % IV SOLN
INTRAVENOUS | Status: DC
  Administered 2015-01-23: 17:00:00 via INTRAVENOUS

## 2015-01-23 MED ORDER — ACETAMINOPHEN 325 MG PO TABS: 650.0000 mg | ORAL_TABLET | Freq: Four times a day (QID) | ORAL | Status: DC | PRN

## 2015-01-23 MED ORDER — DEXTROSE 10 % IV SOLN: INTRAVENOUS | Status: DC

## 2015-01-23 MED ORDER — MAGNESIUM CITRATE PO SOLN: 1.0000 | Freq: Once | ORAL | Status: DC | PRN

## 2015-01-23 MED ORDER — ONDANSETRON HCL 4 MG/2ML IJ SOLN
4.0000 mg | Freq: Once | INTRAMUSCULAR | Status: AC
  Administered 2015-01-23: 4 mg via INTRAVENOUS

## 2015-01-23 MED ORDER — FUROSEMIDE 40 MG PO TABS
40.0000 mg | ORAL_TABLET | Freq: Every morning | ORAL | Status: DC
  Administered 2015-01-23 – 2015-01-24 (×2): 40 mg via ORAL
  Filled 2015-01-23 (×2): qty 1

## 2015-01-23 MED ORDER — PANTOPRAZOLE SODIUM 40 MG PO TBEC
40.0000 mg | DELAYED_RELEASE_TABLET | Freq: Two times a day (BID) | ORAL | Status: DC
  Administered 2015-01-23 – 2015-01-24 (×3): 40 mg via ORAL
  Filled 2015-01-23 (×3): qty 1

## 2015-01-23 MED ORDER — METOPROLOL SUCCINATE ER 50 MG PO TB24
50.0000 mg | ORAL_TABLET | Freq: Every morning | ORAL | Status: DC
  Administered 2015-01-23 – 2015-01-24 (×2): 50 mg via ORAL
  Filled 2015-01-23 (×2): qty 1

## 2015-01-23 MED ORDER — CALCITRIOL 0.25 MCG PO CAPS
0.2500 ug | ORAL_CAPSULE | Freq: Every day | ORAL | Status: DC
Start: 2015-01-23 — End: 2015-01-24
  Administered 2015-01-23 – 2015-01-24 (×2): 0.25 ug via ORAL
  Filled 2015-01-23 (×2): qty 1

## 2015-01-23 MED ORDER — TRAZODONE HCL 50 MG PO TABS
25.0000 mg | ORAL_TABLET | Freq: Every evening | ORAL | Status: DC | PRN
  Filled 2015-01-23: qty 1

## 2015-01-23 MED ORDER — HYDRALAZINE HCL 25 MG PO TABS
50.0000 mg | ORAL_TABLET | Freq: Three times a day (TID) | ORAL | Status: DC
  Administered 2015-01-23 – 2015-01-24 (×4): 50 mg via ORAL
  Filled 2015-01-23 (×4): qty 2

## 2015-01-23 MED ORDER — ATORVASTATIN CALCIUM 40 MG PO TABS
40.0000 mg | ORAL_TABLET | Freq: Every evening | ORAL | Status: DC
  Administered 2015-01-23: 40 mg via ORAL
  Filled 2015-01-23: qty 1

## 2015-01-23 MED ORDER — HYDROCODONE-ACETAMINOPHEN 5-325 MG PO TABS: 1.0000 | ORAL_TABLET | ORAL | Status: DC | PRN

## 2015-01-23 MED ORDER — DEXTROSE 10 % IV SOLN
INTRAVENOUS | Status: DC
  Administered 2015-01-23: 07:00:00 via INTRAVENOUS

## 2015-01-23 MED ORDER — DEXTROSE-NACL 5-0.45 % IV SOLN
INTRAVENOUS | Status: DC
  Administered 2015-01-23: 03:00:00 via INTRAVENOUS

## 2015-01-23 NOTE — ED Notes (Signed)
cbg of 39.

## 2015-01-23 NOTE — ED Provider Notes (Signed)
CSN: [id number]     Arrival date & time 01/23/15  0235 History   First MD Initiated Contact with Patient 01/23/15 0250    Chief Complaint  Patient presents with  . Hypoglycemia     (Consider location/radiation/quality/duration/timing/severity/associated sxs/prior Treatment) HPI  Patient states she felt fine all day. Her husband reports in the middle the night she was sweating and talking out of her head. He tried to get her to eat however she vomited. He called EMS at that point. EMS reports her blood sugar was 38. Patient states she's not nauseated now however she was nauseated later in the ED. She denies diarrhea, coughing. She states she ate as usual today. She states her medications have not been changed. She states she had surgery 2 weeks ago for a perirectal fissure. She is supposed to have the tubes removed in 5 days. She states there's been minimal drainage. She states her home health nurse change the dressing earlier today. Her surgeon was Dr. Zella Richer.  PCP [facility] in [place] Surgeon  Dr Zella Richer  Past Medical History  Diagnosis Date  . Hypertension   . Hyperlipidemia   . GERD (gastroesophageal reflux disease)   . GAD (generalized anxiety disorder)   . Hypothyroidism   . Type II diabetes mellitus   . Right bundle branch block   . LAFB (left anterior fascicular block)   . History of rectal abscess     12-29-2004  bedside I & D  . History of GI bleed     upper 2009  due to esophagitis  &  2002  due to Mallory-Weiss tear  . History of colon polyps     benign  . Diverticulosis of colon   . History of esophagitis   . Diabetic gastroparesis   . History of gout     in issues for several years  . Sacral decubitus ulcer     since 2014- 01-02-15 remains with wound" gauze dressing changes daily.  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 Coronary artery disease   . Complication of anesthesia     post-op confusion   . CKD (chronic kidney  disease), stage III     nephrologist--  dr Mercy Moore  . Diabetic retinopathy     bilateral --  monitored by dr [name]  . Arthritis     knees and hand/fingers. "broke back"-being evaluated for this"weakness left leg"  . Anemia in chronic renal disease     Aranesp injection --  when Hg <11, last injection 12-24-14.  Marland Kitchen Anxiety    Past Surgical History  Procedure Laterality Date  . Retinopathy surgery Bilateral 1980's?  . Orif femur fracture Left 10/09/2012    Procedure: OPEN REDUCTION INTERNAL FIXATION (ORIF) DISTAL FEMUR FRACTURE;  Surgeon: Rozanna Box, MD;  Location: [place];  Service: Orthopedics;  Laterality: Left;  . Compression hip screw Right 05/18/2014    Procedure: COMPRESSION HIP;  Surgeon: Carole Civil, MD;  Location: [facility];  Service: Orthopedics;  Laterality: Right;  . Esophagogastroduodenoscopy  last one 01-09-2011  . Transthoracic echocardiogram  02-18-2011    mild LVH,  ef 55-60%,  grade I diastolic dysfunction/  mild TR/  RV systolic pressure increased consistant with moderate pulmonary hypertension  . Cardiovascular stress test  12-30-2004    normal perfusion study/  no ischemia or infartion/  normal LV wall function and wall motion , ef 66%  . Colonoscopy w/ polypectomy  last one  2008  . Cataract extraction w/ intraocular lens  implant, bilateral  1995  . Incision and drainage of wound N/A 09/30/2014    Procedure: IRRIGATION AND DEBRIDEMENT SACRAL WOUND, EXCISION OF PERIRECTAL TRACT WITH PLACEMENT OF ACCELL;  Surgeon: Theodoro Kos, DO;  Location: [place];  Service: Plastics;  Laterality: N/A;  . Evaluation under anesthesia with fistulectomy N/A 01/06/2015    Procedure: EXAM UNDER ANESTHESIA , placement of seton;  Surgeon: Jackolyn Confer, MD;  Location: [facility];  Service: General;  Laterality: N/A;   Family History  Problem Relation Age of Onset  . Colon cancer Father   . Prostate cancer Father   . Diabetes Father   . Coronary artery disease  Mother 34  . Heart disease Mother   . Diabetes Mother   . Coronary artery disease Sister 19  . Diabetes Sister   . Heart disease Sister   . Diabetes Maternal Grandmother   . Diabetes Sister   . Diabetes Sister   . Diabetes Sister   . Diabetes Sister   . Diabetes Sister    Social History  Substance Use Topics  . Smoking status: Never Smoker   . Smokeless tobacco: Never Used  . Alcohol Use: No   Lives at home Lives with spouse  OB History    No data available     Review of Systems  All other systems reviewed and are negative.     Allergies  Aspirin; Ciprofloxacin; Codeine; Micardis; Other; and Rofecoxib  Home Medications   Prior to Admission medications   Medication Sig Start Date End Date Taking? Authorizing Provider  atorvastatin (LIPITOR) 40 MG tablet Take 1 tablet (40 mg total) by mouth every evening. 10/11/14   Mary-Margaret Hassell Done, FNP  calcitRIOL (ROCALTROL) 0.25 MCG capsule Take 1 capsule (0.25 mcg total) by mouth daily. 10/29/14   Mary-Margaret Hassell Done, FNP  cloNIDine (CATAPRES) 0.1 MG tablet Take 1 tablet (0.1 mg total) by mouth 2 (two) times daily. 10/11/14   Mary-Margaret Hassell Done, FNP  docusate sodium (COLACE) 100 MG capsule Take 1 capsule (100 mg total) by mouth every 12 (twelve) hours. Patient not taking: Reported on 12/26/2014 10/17/14   Milton Ferguson, MD  furosemide (LASIX) 40 MG tablet Take 1 tablet (40 mg total) by mouth every morning. 10/11/14   Mary-Margaret Hassell Done, FNP  glimepiride (AMARYL) 4 MG tablet 1 po qam and 1/2 in afternoon 10/28/14   Sharion Balloon, FNP  hydrALAZINE (APRESOLINE) 50 MG tablet Take 1 tablet (50 mg total) by mouth every 8 (eight) hours. Patient taking differently: Take 50 mg by mouth 3 (three) times daily.  05/23/14   Nita Sells, MD  HYDROcodone-acetaminophen (NORCO/VICODIN) 5-325 MG per tablet Take 1-2 tablets by mouth every 4 (four) hours as needed for moderate pain or severe pain. 01/06/15   Jackolyn Confer, MD  levothyroxine  (SYNTHROID, LEVOTHROID) 75 MCG tablet Take 1 tablet (75 mcg total) by mouth daily before breakfast. 10/11/14   Mary-Margaret Hassell Done, FNP  losartan (COZAAR) 100 MG tablet Take 1 tablet (100 mg total) by mouth every morning. 10/11/14   Mary-Margaret Hassell Done, FNP  metoprolol succinate (TOPROL-XL) 50 MG 24 hr tablet Take 1 tablet (50 mg total) by mouth every morning. 10/11/14   Mary-Margaret Hassell Done, FNP  ondansetron (ZOFRAN) 4 MG tablet Take 1 tablet (4 mg total) by mouth every 8 (eight) hours as needed for nausea or vomiting. 01/06/15   Jackolyn Confer, MD  pantoprazole (PROTONIX) 40 MG tablet Take 1 tablet (40 mg total) by mouth  2 (two) times daily. 10/11/14   Mary-Margaret Hassell Done, FNP  polyethylene glycol (GOLYTELY) 236 G solution Drink one 8 ounce glass every hour until you have a bowel movement Patient not taking: Reported on 12/26/2014 10/17/14   Milton Ferguson, MD  potassium chloride SA (K-DUR,KLOR-CON) 20 MEQ tablet Take 1 tablet (20 mEq total) by mouth 2 (two) times daily. 10/11/14   Mary-Margaret Hassell Done, FNP   BP 145/62 mmHg  Pulse 85  Temp(Src) 98.3 F (36.8 C) (Oral)  Resp 20  Ht 5\' 2"  (1.575 m)  Wt 167 lb (75.751 kg)  BMI 30.54 kg/m2  SpO2 98%  Vital signs normal   Physical Exam  Constitutional: She is oriented to person, place, and time. She appears well-developed and well-nourished.  Non-toxic appearance. She does not appear ill. No distress.  HENT:  Head: Normocephalic and atraumatic.  Right Ear: External ear normal.  Left Ear: External ear normal.  Nose: Nose normal. No mucosal edema or rhinorrhea.  Mouth/Throat: Oropharynx is clear and moist and mucous membranes are normal. No dental abscesses or uvula swelling.  Eyes: Conjunctivae and EOM are normal. Pupils are equal, round, and reactive to light.  Neck: Normal range of motion and full passive range of motion without pain. Neck supple.  Cardiovascular: Normal rate, regular rhythm and normal heart sounds.  Exam reveals no gallop and  no friction rub.   No murmur heard. Pulmonary/Chest: Effort normal and breath sounds normal. No respiratory distress. She has no wheezes. She has no rhonchi. She has no rales. She exhibits no tenderness and no crepitus.  Abdominal: Soft. Normal appearance and bowel sounds are normal. She exhibits no distension. There is no tenderness. There is no rebound and no guarding.  Musculoskeletal: Normal range of motion. She exhibits no edema or tenderness.  Moves all extremities well.   Neurological: She is alert and oriented to person, place, and time. She has normal strength. No cranial nerve deficit.  Patient was alert and talking with her CBG of 39.  Skin: Skin is warm, dry and intact. No rash noted. No erythema. No pallor.  Psychiatric: She has a normal mood and affect. Her speech is normal and behavior is normal. Her mood appears not anxious.  Nursing note and vitals reviewed.   ED Course  Procedures (including critical care time)  Medications  dextrose 5 %-0.45 % sodium chloride infusion ( Intravenous Stopped 01/23/15 0648)  dextrose 10 % infusion ( Intravenous New Bag/Given 01/23/15 0648)  dextrose (GLUTOSE) 40 % oral gel (37.5 g  Given 01/23/15 0301)  ondansetron (ZOFRAN) injection 4 mg (4 mg Intravenous Given 01/23/15 0312)  dextrose 50 % solution 50 mL (50 mLs Intravenous Given 01/23/15 0304)  ondansetron (ZOFRAN) injection 4 mg (4 mg Intravenous Given 01/23/15 0603)    Due to patient's vomiting she was started on D5 half-normal saline IV drip at 125 mL per hour. Her CBG improved. Patient continued to have nausea. She was given more nausea medication. At 6:30 AM her CBG dropped to 85. At this point I felt patient should be admitted for observation for her hypoglycemia.  06:28 Dr Gasper Lloyd, admit to Med-surg, obs, change IV to D10 at 100 cc/hr  Patient's IV was changed to D 10 at 100 mL/h. When I went to talk to patient about her admission she had just been able to drink some fluids without  vomiting.   Labs Review Results for orders placed or performed during the hospital encounter of 01/23/15  Comprehensive metabolic panel  Result  Value Ref Range   Sodium 143 135 - 145 mmol/L   Potassium 5.0 3.5 - 5.1 mmol/L   Chloride 112 (H) 101 - 111 mmol/L   CO2 21 (L) 22 - 32 mmol/L   Glucose, Bld 156 (H) 65 - 99 mg/dL   BUN 27 (H) 6 - 20 mg/dL   Creatinine, Ser 1.48 (H) 0.44 - 1.00 mg/dL   Calcium 8.9 8.9 - 10.3 mg/dL   Total Protein 6.6 6.5 - 8.1 g/dL   Albumin 3.4 (L) 3.5 - 5.0 g/dL   AST 17 15 - 41 U/L   ALT 7 (L) 14 - 54 U/L   Alkaline Phosphatase 86 38 - 126 U/L   Total Bilirubin 0.4 0.3 - 1.2 mg/dL   GFR calc non Af Amer 35 (L) >60 mL/min   GFR calc Af Amer 41 (L) >60 mL/min   Anion gap 10 5 - 15  CBC with Differential  Result Value Ref Range   WBC 9.3 4.0 - 10.5 K/uL   RBC 3.67 (L) 3.87 - 5.11 MIL/uL   Hemoglobin 11.2 (L) 12.0 - 15.0 g/dL   HCT 35.0 (L) 36.0 - 46.0 %   MCV 95.4 78.0 - 100.0 fL   MCH 30.5 26.0 - 34.0 pg   MCHC 32.0 30.0 - 36.0 g/dL   RDW 14.7 11.5 - 15.5 %   Platelets 201 150 - 400 K/uL   Neutrophils Relative % 76 43 - 77 %   Neutro Abs 7.0 1.7 - 7.7 K/uL   Lymphocytes Relative 16 12 - 46 %   Lymphs Abs 1.5 0.7 - 4.0 K/uL   Monocytes Relative 7 3 - 12 %   Monocytes Absolute 0.6 0.1 - 1.0 K/uL   Eosinophils Relative 1 0 - 5 %   Eosinophils Absolute 0.1 0.0 - 0.7 K/uL   Basophils Relative 0 0 - 1 %   Basophils Absolute 0.0 0.0 - 0.1 K/uL  Urinalysis, Routine w reflex microscopic (not at [facility])  Result Value Ref Range   Color, Urine YELLOW YELLOW   APPearance CLEAR CLEAR   Specific Gravity, Urine 1.025 1.005 - 1.030   pH 5.0 5.0 - 8.0   Glucose, UA NEGATIVE NEGATIVE mg/dL   Hgb urine dipstick TRACE (A) NEGATIVE   Bilirubin Urine NEGATIVE NEGATIVE   Ketones, ur NEGATIVE NEGATIVE mg/dL   Protein, ur NEGATIVE NEGATIVE mg/dL   Urobilinogen, UA 0.2 0.0 - 1.0 mg/dL   Nitrite NEGATIVE NEGATIVE   Leukocytes, UA SMALL (A) NEGATIVE  Urine  microscopic-add on  Result Value Ref Range   Squamous Epithelial / LPF MANY (A) RARE   WBC, UA 3-6 <3 WBC/hpf   RBC / HPF 3-6 <3 RBC/hpf   Bacteria, UA MANY (A) RARE  CBG monitoring, ED  Result Value Ref Range   Glucose-Capillary 39 (LL) 65 - 99 mg/dL   Comment 1 Notify RN   CBG monitoring, ED  Result Value Ref Range   Glucose-Capillary 153 (H) 65 - 99 mg/dL  CBG monitoring, ED  Result Value Ref Range   Glucose-Capillary 125 (H) 65 - 99 mg/dL  POC CBG, ED  Result Value Ref Range   Glucose-Capillary 111 (H) 65 - 99 mg/dL  CBG monitoring, ED  Result Value Ref Range   Glucose-Capillary 85 65 - 99 mg/dL   Laboratory interpretation all normal except for initial hypoglycemia, and after a couple hours her CBG started to drop again to 85, mild anemia, renal insufficiency     Imaging Review No results  found. I have personally reviewed and evaluated these images and lab results as part of my medical decision-making.   EKG Interpretation None      MDM   Final diagnoses:  Hypoglycemia  Nausea and vomiting, vomiting of unspecified type    Plan admission  Rolland Porter, MD, Barbette Or, MD 01/23/15 [phone]

## 2015-01-23 NOTE — ED Notes (Signed)
Pt blood sugar was 38 on ems arrival and lastest was 58. Pt's husband gave her mountain dew and peanut butter sandwich but she vomited.

## 2015-01-23 NOTE — Evaluation (Signed)
Physical Therapy Evaluation Patient Details Name: [name] MRN: [id number] DOB: 27-Jul-1945 Today's Date: 01/23/2015   History of Present Illness  SANDER SPECKMAN is al 69 y.o. female past medical history that includes diabetes, CAD, chronic kidney disease, recent repair of anal fissure presents to the emergency department with chief complaint of altered mental status. EMS reports blood sugar 38 at home. In the emergency department patient had 2 episodes of nausea with vomiting in spite of IV of D5 half-normal saline. Her IV was changed to D10 only were asked to admit.  Clinical Impression  Pt states she is very weak but is able to come from supine to sit to stand I.  Ambulates with mod I with a rolling walker.  She will benefit from home health PT but is not in need of acute PT at this time.     Follow Up Recommendations Home health PT    Equipment Recommendations    none   Recommendations for Other Services   none    Precautions / Restrictions Precautions Precautions: None Restrictions Weight Bearing Restrictions: No      Mobility  Bed Mobility Overal bed mobility: Modified Independent                Transfers Overall transfer level: Modified independent Equipment used: Rolling walker (2 wheeled)                Ambulation/Gait Ambulation/Gait assistance: Modified independent (Device/Increase time) Ambulation Distance (Feet): 85 Feet Assistive device: Rolling walker (2 wheeled) Gait Pattern/deviations: Decreased step length - right;Decreased step length - left   Gait velocity interpretation: at or above normal speed for age/gender              Pertinent Vitals/Pain Pain Assessment: No/denies pain    Home Living Family/patient expects to be discharged to:: Private residence Living Arrangements: Spouse/significant other Available Help at Discharge: Available 24 hours/day;Family Type of Home: Mobile home Home Access: Stairs to enter Entrance  Stairs-Rails: Right Entrance Stairs-Number of Steps: 4 Home Layout: One level Home Equipment: Bedside commode;Wheelchair - [employer] - 2 wheels      Prior Function Level of Independence: Independent with assistive device(s)                  Extremity/Trunk Assessment               Lower Extremity Assessment: Overall WFL for tasks assessed         Communication   Communication: No difficulties  Cognition Arousal/Alertness: Awake/alert Behavior During Therapy: WFL for tasks assessed/performed Overall Cognitive Status: Within Functional Limits for tasks assessed                               Assessment/Plan    PT Assessment All further PT needs can be met in the next venue of care  PT Diagnosis Generalized weakness   PT Problem List Decreased activity tolerance;Decreased balance;Decreased strength  PT Treatment Interventions     PT Goals (Current goals can be found in the Care Plan section)      Frequency     Barriers to discharge           End of Session Equipment Utilized During Treatment: Gait belt Activity Tolerance: Patient tolerated treatment well Patient left: with call bell/phone within reach;with chair alarm set;in chair      Functional Assessment Tool Used: ambulation Functional Limitation: Mobility: Walking and moving around Mobility:  Walking and Moving Around Current Status [phone]): At least 20 percent but less than 40 percent impaired, limited or restricted Mobility: Walking and Moving Around Goal Status [phone]): At least 20 percent but less than 40 percent impaired, limited or restricted Mobility: Walking and Moving Around Discharge Status [phone]): At least 20 percent but less than 40 percent impaired, limited or restricted    Time: 1173-5670 PT Time Calculation (min) (ACUTE ONLY): 24 min   Charges:   PT Evaluation $Initial PT Evaluation Tier I: 1 Procedure     PT G Codes:   PT G-Codes **NOT FOR INPATIENT  CLASS** Functional Assessment Tool Used: ambulation Functional Limitation: Mobility: Walking and moving around Mobility: Walking and Moving Around Current Status (L4103): At least 20 percent but less than 40 percent impaired, limited or restricted Mobility: Walking and Moving Around Goal Status [phone]): At least 20 percent but less than 40 percent impaired, limited or restricted Mobility: Walking and Moving Around Discharge Status [phone]): At least 20 percent but less than 40 percent impaired, limited or restricted  Rayetta Humphrey, PT CLT [phone] 01/23/2015, 4:26 PM

## 2015-01-23 NOTE — H&P (Signed)
[facility] History and Physical  [name] [id number] DOB: [birth year] DOA: 01/23/2015  Referring physician: knapp PCP: Chevis Pretty, FNP   Chief Complaint: encephalopathy  HPI: [name] is a delightful 69 y.o. female past medical history that includes diabetes, CAD, chronic kidney disease, recent repair of anal fissure presents to the emergency department with chief complaint of altered mental status. EMS reports blood sugar 38 at home. In the emergency department patient had 2 episodes of nausea with vomiting in spite of IV of D5 half-normal saline. Her IV was changed to D10 only were asked to admit.  Patient reports being in her usual state of health. She did undergo surgical repair of an anal fissure 2 weeks ago with tubes. She denies any recent fever chills nausea vomiting constipation diarrhea. She denies any dysuria hematuria frequency or urgency. She denies any shortness of breath cough headache dizziness. She denies any recent change in her medications and has not required pain medication from her recent surgery.  In the emergency department CBC unremarkable basic metabolic panel with a creatinine of 1.48 glucose of 156 BUN 27 CO2 21. She is afebrile and hemodynamically stable she is not hypoxic. At the time of my exam CBG is 87.   Review of Systems:  10 point review of systems complete and all systems are negative except as indicated in the history of present illness  Past Medical History  Diagnosis Date  . Hypertension   . Hyperlipidemia   . GERD (gastroesophageal reflux disease)   . GAD (generalized anxiety disorder)   . Hypothyroidism   . Type II diabetes mellitus   . Right bundle branch block   . LAFB (left anterior fascicular block)   . History of rectal abscess     12-29-2004  bedside I & D  . History of GI bleed     upper 2009  due to esophagitis  &  2002  due to Mallory-Weiss tear  . History of colon polyps     benign  .  Diverticulosis of colon   . History of esophagitis   . Diabetic gastroparesis   . History of gout     in issues for several years  . Sacral decubitus ulcer     since 2014- 01-02-15 remains with wound" gauze dressing changes daily.  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 Coronary artery disease   . Complication of anesthesia     post-op confusion   . CKD (chronic kidney disease), stage III     nephrologist--  dr Mercy Moore  . Diabetic retinopathy     bilateral --  monitored by dr [name]  . Arthritis     knees and hand/fingers. "broke back"-being evaluated for this"weakness left leg"  . Anemia in chronic renal disease     Aranesp injection --  when Hg <11, last injection 12-24-14.  Marland Kitchen Anxiety    Past Surgical History  Procedure Laterality Date  . Retinopathy surgery Bilateral 1980's?  . Orif femur fracture Left 10/09/2012    Procedure: OPEN REDUCTION INTERNAL FIXATION (ORIF) DISTAL FEMUR FRACTURE;  Surgeon: Rozanna Box, MD;  Location: [place];  Service: Orthopedics;  Laterality: Left;  . Compression hip screw Right 05/18/2014    Procedure: COMPRESSION HIP;  Surgeon: Carole Civil, MD;  Location: [facility];  Service: Orthopedics;  Laterality: Right;  . Esophagogastroduodenoscopy  last one 01-09-2011  . Transthoracic echocardiogram  02-18-2011    mild LVH,  ef 55-60%,  grade I diastolic dysfunction/  mild TR/  RV systolic pressure increased consistant with moderate pulmonary hypertension  . Cardiovascular stress test  12-30-2004    normal perfusion study/  no ischemia or infartion/  normal LV wall function and wall motion , ef 66%  . Colonoscopy w/ polypectomy  last one 2008  . Cataract extraction w/ intraocular lens  implant, bilateral  1995  . Incision and drainage of wound N/A 09/30/2014    Procedure: IRRIGATION AND DEBRIDEMENT SACRAL WOUND, EXCISION OF PERIRECTAL TRACT WITH PLACEMENT OF ACCELL;  Surgeon: Theodoro Kos, DO;  Location: [place];  Service: Plastics;  Laterality: N/A;  . Evaluation under anesthesia with fistulectomy N/A 01/06/2015    Procedure: EXAM UNDER ANESTHESIA , placement of seton;  Surgeon: Jackolyn Confer, MD;  Location: [facility];  Service: General;  Laterality: N/A;   Social History:  reports that she has never smoked. She has never used smokeless tobacco. She reports that she does not drink alcohol or use illicit drugs. Patient is married she lives at home with her husband she ambulates with a walker she's fairly independent with ADLs Allergies  Allergen Reactions  . Aspirin Nausea And Vomiting  . Ciprofloxacin Other (See Comments)    Upset Stomach  . Codeine Nausea And Vomiting    Makes me sick   . Micardis [Telmisartan] Other (See Comments)    unknown  . Other     No otc pain medications  . Rofecoxib Other (See Comments)    unknown    Family History  Problem Relation Age of Onset  . Colon cancer Father   . Prostate cancer Father   . Diabetes Father   . Coronary artery disease Mother 43  . Heart disease Mother   . Diabetes Mother   . Coronary artery disease Sister 37  . Diabetes Sister   . Heart disease Sister   . Diabetes Maternal Grandmother   . Diabetes Sister   . Diabetes Sister   . Diabetes Sister   . Diabetes Sister   . Diabetes Sister      Prior to Admission medications   Medication Sig Start Date End Date Taking? Authorizing Provider  atorvastatin (LIPITOR) 40 MG tablet Take 1 tablet (40 mg total) by mouth every evening. 10/11/14   Mary-Margaret Hassell Done, FNP  calcitRIOL (ROCALTROL) 0.25 MCG capsule Take 1 capsule (0.25 mcg total) by mouth daily. 10/29/14   Mary-Margaret Hassell Done, FNP  cloNIDine (CATAPRES) 0.1 MG tablet Take 1 tablet (0.1 mg total) by mouth 2 (two) times daily. 10/11/14   Mary-Margaret Hassell Done, FNP  docusate sodium (COLACE) 100 MG capsule Take 1 capsule (100 mg total) by mouth every 12 (twelve) hours. Patient not taking: Reported on 12/26/2014 10/17/14   Milton Ferguson,  MD  furosemide (LASIX) 40 MG tablet Take 1 tablet (40 mg total) by mouth every morning. 10/11/14   Mary-Margaret Hassell Done, FNP  hydrALAZINE (APRESOLINE) 50 MG tablet Take 1 tablet (50 mg total) by mouth every 8 (eight) hours. Patient taking differently: Take 50 mg by mouth 3 (three) times daily.  05/23/14   Nita Sells, MD  HYDROcodone-acetaminophen (NORCO/VICODIN) 5-325 MG per tablet Take 1-2 tablets by mouth every 4 (four) hours as needed for moderate pain or severe pain. 01/06/15   Jackolyn Confer, MD  levothyroxine (SYNTHROID, LEVOTHROID) 75 MCG tablet Take 1 tablet (75 mcg total) by mouth daily before breakfast. 10/11/14   Mary-Margaret Hassell Done, FNP  losartan (COZAAR) 100 MG tablet Take 1 tablet (100  mg total) by mouth every morning. 10/11/14   Mary-Margaret Hassell Done, FNP  metoprolol succinate (TOPROL-XL) 50 MG 24 hr tablet Take 1 tablet (50 mg total) by mouth every morning. 10/11/14   Mary-Margaret Hassell Done, FNP  ondansetron (ZOFRAN) 4 MG tablet Take 1 tablet (4 mg total) by mouth every 8 (eight) hours as needed for nausea or vomiting. 01/06/15   Jackolyn Confer, MD  pantoprazole (PROTONIX) 40 MG tablet Take 1 tablet (40 mg total) by mouth 2 (two) times daily. 10/11/14   Mary-Margaret Hassell Done, FNP  potassium chloride SA (K-DUR,KLOR-CON) 20 MEQ tablet Take 1 tablet (20 mEq total) by mouth 2 (two) times daily. 10/11/14   Mary-Margaret Hassell Done, FNP   Physical Exam: Filed Vitals:   01/23/15 0700 01/23/15 0730 01/23/15 0805 01/23/15 0833  BP: 148/54 154/61 156/80 166/63  Pulse:   84 86  Temp:   98.4 F (36.9 C) 98.7 F (37.1 C)  TempSrc:   Oral Oral  Resp:   16 20  Height:      Weight:      SpO2:   100% 100%    Wt Readings from Last 3 Encounters:  01/23/15 75.751 kg (167 lb)  01/06/15 76.204 kg (168 lb)  01/02/15 76.204 kg (168 lb)    General:  Appears calm and comfortable, obese Eyes: PERRL,  Lids slightly puffy, irises & conjunctiva ENT: grossly normal hearing, his membranes of her mouth  are pink slightly dry Neck: no LAD, masses or thyromegaly Cardiovascular: RRR, no m/r/g. No LE edema. Pedal pulses present and palpable Respiratory: CTA bilaterally, no w/r/r. Normal respiratory effort. Abdomen: soft, ntnd obese positive bowel sounds Skin: no rash or induration seen on limited exam, dressing anal/rectal area small amount purulent drainage no odor Musculoskeletal: grossly normal tone BUE/BLE moves all extremities Psychiatric: grossly normal mood and affect, speech fluent and appropriate Neurologic: grossly non-focal. Alert and oriented 3 follows commands speech clear           Labs on Admission:  Basic Metabolic Panel:  Recent Labs Lab 01/23/15 0321  NA 143  K 5.0  CL 112*  CO2 21*  GLUCOSE 156*  BUN 27*  CREATININE 1.48*  CALCIUM 8.9   Liver Function Tests:  Recent Labs Lab 01/23/15 0321  AST 17  ALT 7*  ALKPHOS 86  BILITOT 0.4  PROT 6.6  ALBUMIN 3.4*   No results for input(s): LIPASE, AMYLASE in the last 168 hours. No results for input(s): AMMONIA in the last 168 hours. CBC:  Recent Labs Lab 01/23/15 0321  WBC 9.3  NEUTROABS 7.0  HGB 11.2*  HCT 35.0*  MCV 95.4  PLT 201   Cardiac Enzymes: No results for input(s): CKTOTAL, CKMB, CKMBINDEX, TROPONINI in the last 168 hours.  BNP (last 3 results) No results for input(s): BNP in the last 8760 hours.  ProBNP (last 3 results) No results for input(s): PROBNP in the last 8760 hours.  CBG:  Recent Labs Lab 01/23/15 0258 01/23/15 0333 01/23/15 0502 01/23/15 0621 01/23/15 0738  GLUCAP 153* 125* 111* 85 87    Radiological Exams on Admission: No results found.  EKG:   Assessment/Plan Principal Problem:   Hypoglycemia: Likely related to Amaryl in the setting of chronic kidney disease stage III. We'll discontinue Amaryl. Continue D 10 for 10 more hours then change to D5 at lower rate if indicated. CBG every 6 hours. Will need different home regimen. Will obtain A1c as well.  Active  Problems:   Nausea and vomiting: Resolved at the time  of my exam. She tolerated modified practice without issue.  CKD (chronic kidney disease), stage III: Chart review indicates current creatinine a little above baseline. IV fluids as above. I will hold her ARB. We'll continue her home Lasix however and monitor closely.    Anemia in chronic renal disease: Hemoglobin currently at baseline. No signs symptoms of bleeding. Monitor   Essential hypertension: Fair control at admission. Home medications include Catapres, Lasix, Apresoline, losartan, metoprolol. Will continue Lasix, clonidine, Apresoline and metoprolol with parameters. Holding ARB.    Anxiety state: Appears stable at baseline.      Obesity: BMI 30.6. Nutritional consult    Diabetes: See #1. Home medications include Amaryl this will need to be discontinued. No regimen developed at discharge    Hypothyroidism: We'll obtain TSH. Continue home medications    Gastroesophageal reflux disease without esophagitis: Stable at baseline. Continue her home medications  Acute encephalopathy. Related to #1. Resolved at the time of my exam.     Code Status: full DVT Prophylaxis: Family Communication: none present Disposition Plan: home when ready  Time spent: 15 minutes  [facility] Pager [phone]

## 2015-01-23 NOTE — Care Management Note (Signed)
Case Management Note  Patient Details  Name: BHOOMI QUI MRN: [id number] Date of Birth: 12-25-1945  Expected Discharge Date:                  Expected Discharge Plan:  [place]  In-House Referral:  NA  Discharge planning Services  CM Consult  Post Acute Care Choice:  Resumption of Svcs/PTA Provider Choice offered to:  NA  DME Arranged:    DME Agency:     HH Arranged:  RN [place] Agency:     Status of Service:  In process, will continue to follow  Medicare Important Message Given:    Date Medicare IM Given:    Medicare IM give by:    Date Additional Medicare IM Given:    Additional Medicare Important Message give by:     If discussed at [place] of Stay Meetings, dates discussed:    Additional Comments: Pt is from home, lives with her husband who works during the day. Pt is active with [facility] for nursing services. Octavia Bruckner of [facility], made aware of admission and will obtain pt info from chart. Pt will need order to resume [facility] services at DC. Pt uses a walker for ambulation and has no other DME. Pt's husband assists with meal prep and pt is requesting info on meal services for when her husband is at work. Pt given contact info for meals on wheels. Pt anticipates to DC home with resumptions of services, no new DME anticipated. Will cont to follow for DC planning.  Sherald Barge, RN 01/23/2015, 11:29 AM

## 2015-01-23 NOTE — ED Notes (Signed)
cbg of 85.

## 2015-01-24 LAB — BASIC METABOLIC PANEL
Anion gap: 4 — ABNORMAL LOW (ref 5–15)
BUN: 27 mg/dL — AB (ref 6–20)
CALCIUM: 8.2 mg/dL — AB (ref 8.9–10.3)
CO2: 23 mmol/L (ref 22–32)
CREATININE: 1.42 mg/dL — AB (ref 0.44–1.00)
Chloride: 108 mmol/L (ref 101–111)
GFR calc Af Amer: 43 mL/min — ABNORMAL LOW (ref >60)
GFR, EST NON AFRICAN AMERICAN: 37 mL/min — AB (ref >60)
Glucose, Bld: 115 mg/dL — ABNORMAL HIGH (ref 65–99)
Potassium: 5.1 mmol/L (ref 3.5–5.1)
SODIUM: 135 mmol/L (ref 135–145)

## 2015-01-24 LAB — GLUCOSE, CAPILLARY
GLUCOSE-CAPILLARY: 122 mg/dL — AB (ref 65–99)
Glucose-Capillary: 120 mg/dL — ABNORMAL HIGH (ref 65–99)

## 2015-01-24 MED ORDER — HYDROCODONE-ACETAMINOPHEN 5-325 MG PO TABS: 1.0000 | ORAL_TABLET | ORAL | Status: DC | PRN

## 2015-01-24 MED ORDER — LEVOTHYROXINE SODIUM 75 MCG PO TABS: 75.0000 ug | ORAL_TABLET | Freq: Every day | ORAL | Status: DC

## 2015-01-24 MED ORDER — CALCITRIOL 0.25 MCG PO CAPS
0.2500 ug | ORAL_CAPSULE | Freq: Every day | ORAL | Status: DC
Start: 2015-01-24 — End: 2015-03-07

## 2015-01-24 MED ORDER — CLONIDINE HCL 0.1 MG PO TABS: 0.1000 mg | ORAL_TABLET | Freq: Two times a day (BID) | ORAL | Status: DC

## 2015-01-24 MED ORDER — HYDRALAZINE HCL 50 MG PO TABS: 50.0000 mg | ORAL_TABLET | Freq: Three times a day (TID) | ORAL | Status: DC

## 2015-01-24 MED ORDER — POTASSIUM CHLORIDE CRYS ER 20 MEQ PO TBCR: 20.0000 meq | EXTENDED_RELEASE_TABLET | Freq: Two times a day (BID) | ORAL | Status: DC

## 2015-01-24 MED ORDER — LOSARTAN POTASSIUM 100 MG PO TABS: 100.0000 mg | ORAL_TABLET | Freq: Every morning | ORAL | Status: DC

## 2015-01-24 MED ORDER — FUROSEMIDE 40 MG PO TABS: 40.0000 mg | ORAL_TABLET | Freq: Every morning | ORAL | Status: DC

## 2015-01-24 MED ORDER — ATORVASTATIN CALCIUM 40 MG PO TABS: 40.0000 mg | ORAL_TABLET | Freq: Every evening | ORAL | Status: DC

## 2015-01-24 MED ORDER — METOPROLOL SUCCINATE ER 50 MG PO TB24: 50.0000 mg | ORAL_TABLET | Freq: Every morning | ORAL | Status: DC

## 2015-01-24 MED ORDER — PANTOPRAZOLE SODIUM 40 MG PO TBEC: 40.0000 mg | DELAYED_RELEASE_TABLET | Freq: Two times a day (BID) | ORAL | Status: DC

## 2015-01-24 MED ORDER — ONDANSETRON HCL 4 MG PO TABS: 4.0000 mg | ORAL_TABLET | Freq: Three times a day (TID) | ORAL | Status: DC | PRN

## 2015-01-24 NOTE — Care Management Important Message (Signed)
Important Message  Patient Details  Name: RADIANCE DEVOTO MRN: [id number] Date of Birth: [birth year]   Medicare Important Message Given:  N/A - LOS <3 / Initial given by admissions    Sherald Barge, RN 01/24/2015, 12:13 PM

## 2015-01-24 NOTE — Care Management Note (Signed)
Case Management Note  Patient Details  Name: [name] MRN: [id number] Date of Birth: Feb 04, 1946   Expected Discharge Date:                  Expected Discharge Plan:  [place]  In-House Referral:  NA  Discharge planning Services  CM Consult  Post Acute Care Choice:  Resumption of Svcs/PTA Provider Choice offered to:  NA  DME Arranged:    DME Agency:     HH Arranged:  RN, PT HH Agency:  [facility]  Status of Service:  Completed, signed off  Medicare Important Message Given:  N/A - LOS <3 / Initial given by admissions Date Medicare IM Given:    Medicare IM give by:    Date Additional Medicare IM Given:    Additional Medicare Important Message give by:     If discussed at [place] of Stay Meetings, dates discussed:    Additional Comments: Pt discharging home today with resumption of HH services through [facility]. Tim Justis, of [facility], made aware of DC and will obtain pt info from chart. Pt aware [facility] has 48 hours to resume services. No further CM needs noted.  Sherald Barge, RN 01/24/2015, 12:15 PM

## 2015-01-24 NOTE — Progress Notes (Signed)
D/c instructions reviewed with patient.  Patient  Verbalized understanding. dc'd to home with husband.

## 2015-01-24 NOTE — Discharge Summary (Signed)
Physician Discharge Summary  [name] [id number] DOB: 10-11-1945 DOA: 01/23/2015  PCP: Chevis Pretty, FNP  Admit date: 01/23/2015 Discharge date: 01/24/2015  Time spent: 40 minutes  Recommendations for Outpatient Follow-up:  1. Follow up with PCP 1-2 weeks for evaluation of DM control and renal function. Follow A1c 2. HH PT and RN for dressing changes  Discharge Diagnoses:  Principal Problem:   Hypoglycemia Active Problems:   Anxiety state   Essential hypertension   Disorder resulting from impaired renal function   Obesity   Diabetes   Hypothyroidism   Gastroesophageal reflux disease without esophagitis   Nausea and vomiting   CKD (chronic kidney disease), stage III   Anemia in chronic renal disease   Acute encephalopathy   Discharge Condition: stable  Diet recommendation: carb modified heart healthy  Filed Weights   01/23/15 0239 01/24/15 0631  Weight: 75.751 kg (167 lb) 79.3 kg (174 lb 13.2 oz)    History of present illness:  [name] is a delightful 69 y.o. female past medical history that includes diabetes, CAD, chronic kidney disease, recent repair of anal fissure presented to the emergency department on 01/23/15 with chief complaint of altered mental status. EMS reported blood sugar 38 at home. In the emergency department patient had 2 episodes of nausea with vomiting in spite of IV of D5 half-normal saline. Her IV was changed to D10.  Patient reported being in her usual state of health. She did undergo surgical repair of an anal fissure 2 weeks prior with tubes. She denied any recent fever chills nausea vomiting constipation diarrhea. She denied any dysuria hematuria frequency or urgency. She denied any shortness of breath cough headache dizziness. She denied any recent change in her medications and had not required pain medication from her recent surgery.  In the emergency department CBC unremarkable basic metabolic panel with a creatinine of 1.48  glucose of 156 BUN 27 CO2 21. She was afebrile and hemodynamically stable she was not hypoxic. At the time of admission CBG is 87.  Hospital Course:  Hypoglycemia: Likely related to Amaryl in the setting of chronic kidney disease stage III.  Amaryl discontinued. Provided with  D10 IV solution for 10 hours then change to D5.  CBG range 77-136. Will discharge with no diabetes agents. Recommend close OP follow up for monitoring of blood sugar for optimal control.   A1c pending at discharge to be followed by PCP.  Active Problems:  Nausea and vomiting: Resolved quickly.  CKD (chronic kidney disease), stage III: Chart review indicates a little above baseline. Recommend close OP follow up for    Anemia in chronic renal disease: Hemoglobin currently at baseline. No signs symptoms of bleeding. Monitor  Essential hypertension: Fair control.   Anxiety state: remained stable at baseline.    Obesity: BMI 30.6. Nutritional consult   Diabetes: See #1. Home medications included Amaryl and  this was discontinued. Close OP follow up recomnmended   Hypothyroidism:  TSH 0.343. Continue home medications   Gastroesophageal reflux disease without esophagitis: Stable at baseline.   Acute encephalopathy. Related to #1. Resolved.     Procedures:  none  Consultations:  none  Discharge Exam: Filed Vitals:   01/24/15 0631  BP: 132/77  Pulse: 84  Temp: 98.2 F (36.8 C)  Resp: 18    General: obese appears comfortable Cardiovascular: rrr no MGR no LE edema Respiratory: normal effort BS clear bilaterally no wheeze  Discharge Instructions   Discharge Instructions    Diet -  low sodium heart healthy    Complete by:  As directed      Discharge instructions    Complete by:  As directed   Take medications as prescribed Monitor blood sugar at least daily and keep journal Take blood sugar readings to PCP appointment for evaluation Follow up with PCP as scheduled     Increase  activity slowly    Complete by:  As directed      Increase activity slowly    Complete by:  As directed           Current Discharge Medication List    CONTINUE these medications which have CHANGED   Details  atorvastatin (LIPITOR) 40 MG tablet Take 1 tablet (40 mg total) by mouth every evening.   Associated Diagnoses: Hyperlipidemia with target LDL less than 100    calcitRIOL (ROCALTROL) 0.25 MCG capsule Take 1 capsule (0.25 mcg total) by mouth daily.    cloNIDine (CATAPRES) 0.1 MG tablet Take 1 tablet (0.1 mg total) by mouth 2 (two) times daily.   Associated Diagnoses: Essential hypertension    furosemide (LASIX) 40 MG tablet Take 1 tablet (40 mg total) by mouth every morning.   Associated Diagnoses: Peripheral edema    hydrALAZINE (APRESOLINE) 50 MG tablet Take 1 tablet (50 mg total) by mouth every 8 (eight) hours.    HYDROcodone-acetaminophen (NORCO/VICODIN) 5-325 MG per tablet Take 1-2 tablets by mouth every 4 (four) hours as needed for moderate pain or severe pain.    levothyroxine (SYNTHROID, LEVOTHROID) 75 MCG tablet Take 1 tablet (75 mcg total) by mouth daily before breakfast.   Associated Diagnoses: Hypothyroidism due to acquired atrophy of thyroid    losartan (COZAAR) 100 MG tablet Take 1 tablet (100 mg total) by mouth every morning.   Associated Diagnoses: Essential hypertension    metoprolol succinate (TOPROL-XL) 50 MG 24 hr tablet Take 1 tablet (50 mg total) by mouth every morning.   Associated Diagnoses: Essential hypertension    ondansetron (ZOFRAN) 4 MG tablet Take 1 tablet (4 mg total) by mouth every 8 (eight) hours as needed for nausea or vomiting.    pantoprazole (PROTONIX) 40 MG tablet Take 1 tablet (40 mg total) by mouth 2 (two) times daily.   Associated Diagnoses: Gastroesophageal reflux disease with esophagitis    potassium chloride SA (K-DUR,KLOR-CON) 20 MEQ tablet Take 1 tablet (20 mEq total) by mouth 2 (two) times daily.   Associated Diagnoses:  Hypokalemia      STOP taking these medications     glimepiride (AMARYL) 4 MG tablet      docusate sodium (COLACE) 100 MG capsule        Allergies  Allergen Reactions  . Aspirin Nausea And Vomiting  . Ciprofloxacin Other (See Comments)    Upset Stomach  . Codeine Nausea And Vomiting    Makes me sick   . Micardis [Telmisartan] Other (See Comments)    unknown  . Other     No otc pain medications  . Rofecoxib Other (See Comments)    unknown   Follow-up Information    Follow up with Chevis Pretty, FNP On 02/03/2015.   Specialty:  Nurse Practitioner   Why:  1 week for evaluation of blood sugar control  at 3:00 pm   Contact information:   [place] [id number] [phone]        The results of significant diagnostics from this hospitalization (including imaging, microbiology, ancillary and laboratory) are listed below for reference.  Significant Diagnostic Studies: Mr Lumbar Spine Wo Contrast  12/26/2014   CLINICAL DATA:  Left leg weakness. Lumbar spondylosis with myelopathy.  EXAM: MRI LUMBAR SPINE WITHOUT CONTRAST  TECHNIQUE: Multiplanar, multisequence MR imaging of the lumbar spine was performed. No intravenous contrast was administered.  COMPARISON:  Pelvic MRI 11/04/2014  FINDINGS: Moderately severe chronic compression fracture of L3 with mild retropulsion of bone into the canal.  Mild to moderate fracture of L4. There is edema in the superior endplate suggesting possible acute on top of chronic fracture. This edema was present on the prior MRI as well. No other fracture or mass. Conus medullaris normal and terminates L1-2.  L1-2:  Negative  L2-3: Disc degeneration. Mild spinal stenosis due to retropulsion of L3 into the canal from chronic fracture. Early facet degeneration  L3-4: 3 mm anterior slip. Bilateral facet hypertrophy and mild narrowing of the spinal canal.  L4-5: Diffuse disc bulging and facet degeneration. Mild narrowing of the canal.  Neural foramina patent  L5-S1:  Negative  IMPRESSION: Moderately severe chronic compression fracture L3 with mild retropulsion of bone into the canal causing mild spinal stenosis  Mild to moderate fracture of L4. There appears to be a chronic as well as a subacute component. No significant spinal stenosis.  Grade 1 slip L3-4. Mild narrowing of the canal L3-4 and L4-5 without significant stenosis.   Electronically Signed   By: Franchot Gallo M.D.   On: 12/26/2014 14:53    Microbiology: No results found for this or any previous visit (from the past 240 hour(s)).   Labs: Basic Metabolic Panel:  Recent Labs Lab 01/23/15 0321 01/24/15 0619  NA 143 135  K 5.0 5.1  CL 112* 108  CO2 21* 23  GLUCOSE 156* 115*  BUN 27* 27*  CREATININE 1.48* 1.42*  CALCIUM 8.9 8.2*   Liver Function Tests:  Recent Labs Lab 01/23/15 0321  AST 17  ALT 7*  ALKPHOS 86  BILITOT 0.4  PROT 6.6  ALBUMIN 3.4*   No results for input(s): LIPASE, AMYLASE in the last 168 hours. No results for input(s): AMMONIA in the last 168 hours. CBC:  Recent Labs Lab 01/23/15 0321  WBC 9.3  NEUTROABS 7.0  HGB 11.2*  HCT 35.0*  MCV 95.4  PLT 201   Cardiac Enzymes: No results for input(s): CKTOTAL, CKMB, CKMBINDEX, TROPONINI in the last 168 hours. BNP: BNP (last 3 results) No results for input(s): BNP in the last 8760 hours.  ProBNP (last 3 results) No results for input(s): PROBNP in the last 8760 hours.  CBG:  Recent Labs Lab 01/23/15 0738 01/23/15 1112 01/23/15 1746 01/23/15 2342 01/24/15 0656  GLUCAP 87 77 169* 136* 122*       Signed:  Beecher Furio M  [facility] 01/24/2015, 11:12 AM

## 2015-01-25 LAB — HEMOGLOBIN A1C
Hgb A1c MFr Bld: 6.9 % — ABNORMAL HIGH (ref 4.8–5.6)
MEAN PLASMA GLUCOSE: 151 mg/dL

## 2015-01-27 DIAGNOSIS — I10 Essential (primary) hypertension: Secondary | ICD-10-CM | POA: Diagnosis not present

## 2015-01-27 DIAGNOSIS — M5136 Other intervertebral disc degeneration, lumbar region: Secondary | ICD-10-CM | POA: Diagnosis not present

## 2015-01-27 DIAGNOSIS — R32 Unspecified urinary incontinence: Secondary | ICD-10-CM | POA: Diagnosis not present

## 2015-01-27 DIAGNOSIS — E119 Type 2 diabetes mellitus without complications: Secondary | ICD-10-CM | POA: Diagnosis not present

## 2015-01-27 DIAGNOSIS — M1990 Unspecified osteoarthritis, unspecified site: Secondary | ICD-10-CM | POA: Diagnosis not present

## 2015-01-27 DIAGNOSIS — K603 Anal fistula: Secondary | ICD-10-CM | POA: Diagnosis not present

## 2015-01-28 ENCOUNTER — Encounter (HOSPITAL_COMMUNITY): Admission: RE | Admit: 2015-01-28 | Payer: Medicare Other | Source: Ambulatory Visit

## 2015-01-28 ENCOUNTER — Encounter (HOSPITAL_COMMUNITY): Payer: Medicare Other

## 2015-01-29 DIAGNOSIS — K603 Anal fistula: Secondary | ICD-10-CM | POA: Diagnosis not present

## 2015-01-29 DIAGNOSIS — E119 Type 2 diabetes mellitus without complications: Secondary | ICD-10-CM | POA: Diagnosis not present

## 2015-01-29 DIAGNOSIS — R32 Unspecified urinary incontinence: Secondary | ICD-10-CM | POA: Diagnosis not present

## 2015-01-29 DIAGNOSIS — I10 Essential (primary) hypertension: Secondary | ICD-10-CM | POA: Diagnosis not present

## 2015-01-29 DIAGNOSIS — M5136 Other intervertebral disc degeneration, lumbar region: Secondary | ICD-10-CM | POA: Diagnosis not present

## 2015-01-29 DIAGNOSIS — M1990 Unspecified osteoarthritis, unspecified site: Secondary | ICD-10-CM | POA: Diagnosis not present

## 2015-01-31 ENCOUNTER — Encounter (HOSPITAL_COMMUNITY)
Admission: RE | Admit: 2015-01-31 | Discharge: 2015-01-31 | Disposition: A | Discharge status: Home / Self Care | Payer: Medicare Other | Source: Ambulatory Visit | Attending: Nephrology | Admitting: Nephrology

## 2015-01-31 DIAGNOSIS — N183 Chronic kidney disease, stage 3 (moderate): Secondary | ICD-10-CM | POA: Insufficient documentation

## 2015-01-31 DIAGNOSIS — M5136 Other intervertebral disc degeneration, lumbar region: Secondary | ICD-10-CM | POA: Diagnosis not present

## 2015-01-31 DIAGNOSIS — E119 Type 2 diabetes mellitus without complications: Secondary | ICD-10-CM | POA: Diagnosis not present

## 2015-01-31 DIAGNOSIS — R32 Unspecified urinary incontinence: Secondary | ICD-10-CM | POA: Diagnosis not present

## 2015-01-31 DIAGNOSIS — D638 Anemia in other chronic diseases classified elsewhere: Secondary | ICD-10-CM | POA: Insufficient documentation

## 2015-01-31 DIAGNOSIS — K603 Anal fistula: Secondary | ICD-10-CM | POA: Diagnosis not present

## 2015-01-31 DIAGNOSIS — M1990 Unspecified osteoarthritis, unspecified site: Secondary | ICD-10-CM | POA: Diagnosis not present

## 2015-01-31 DIAGNOSIS — I10 Essential (primary) hypertension: Secondary | ICD-10-CM | POA: Diagnosis not present

## 2015-01-31 LAB — HEMOGLOBIN AND HEMATOCRIT, BLOOD
HEMATOCRIT: 35.8 % — AB (ref 36.0–46.0)
HEMOGLOBIN: 11.6 g/dL — AB (ref 12.0–15.0)

## 2015-01-31 MED ORDER — DARBEPOETIN ALFA 100 MCG/0.5ML IJ SOSY: 100.0000 ug | PREFILLED_SYRINGE | INTRAMUSCULAR | Status: DC

## 2015-01-31 MED ORDER — CLONIDINE HCL 0.1 MG PO TABS
0.1000 mg | ORAL_TABLET | Freq: Once | ORAL | Status: AC
  Administered 2015-01-31: 0.1 mg via ORAL
  Filled 2015-01-31: qty 1

## 2015-01-31 NOTE — Progress Notes (Signed)
Patient came in with an initial BP of 213/91. Rechecked several times. Patient received 0.1 mg of clonidine and BP was rechecked 30 minutes after administering pill. BP 211/69. Dr. Mercy Moore notified and was told to hold aranesp and reschedule patient for a recheck in one week.

## 2015-01-31 NOTE — Progress Notes (Signed)
Results for [name], [name] (MRN [id number]) as of 01/31/2015 08:43  Ref. Range 01/31/2015 08:00  Hemoglobin Latest Ref Range: 12.0-15.0 g/dL 11.6 (L)  HCT Latest Ref Range: 36.0-46.0 % 35.8 (L)

## 2015-02-03 ENCOUNTER — Ambulatory Visit (INDEPENDENT_AMBULATORY_CARE_PROVIDER_SITE_OTHER): Payer: Medicare Other | Admitting: Nurse Practitioner

## 2015-02-03 ENCOUNTER — Encounter: Payer: Self-pay | Admitting: Nurse Practitioner

## 2015-02-03 VITALS — BP 222/89 | HR 83 | Temp 97.7°F | Ht 62.0 in | Wt 171.0 lb

## 2015-02-03 DIAGNOSIS — E119 Type 2 diabetes mellitus without complications: Secondary | ICD-10-CM | POA: Diagnosis not present

## 2015-02-03 DIAGNOSIS — E162 Hypoglycemia, unspecified: Secondary | ICD-10-CM

## 2015-02-03 DIAGNOSIS — K2100 Gastro-esophageal reflux disease with esophagitis, without bleeding: Secondary | ICD-10-CM

## 2015-02-03 DIAGNOSIS — Z09 Encounter for follow-up examination after completed treatment for conditions other than malignant neoplasm: Secondary | ICD-10-CM

## 2015-02-03 DIAGNOSIS — E876 Hypokalemia: Secondary | ICD-10-CM | POA: Diagnosis not present

## 2015-02-03 DIAGNOSIS — I10 Essential (primary) hypertension: Secondary | ICD-10-CM | POA: Diagnosis not present

## 2015-02-03 DIAGNOSIS — E038 Other specified hypothyroidism: Secondary | ICD-10-CM

## 2015-02-03 DIAGNOSIS — L89612 Pressure ulcer of right heel, stage 2: Secondary | ICD-10-CM | POA: Diagnosis not present

## 2015-02-03 DIAGNOSIS — K603 Anal fistula: Secondary | ICD-10-CM | POA: Diagnosis not present

## 2015-02-03 DIAGNOSIS — E785 Hyperlipidemia, unspecified: Secondary | ICD-10-CM

## 2015-02-03 DIAGNOSIS — E034 Atrophy of thyroid (acquired): Secondary | ICD-10-CM | POA: Diagnosis not present

## 2015-02-03 DIAGNOSIS — R609 Edema, unspecified: Secondary | ICD-10-CM | POA: Diagnosis not present

## 2015-02-03 DIAGNOSIS — R32 Unspecified urinary incontinence: Secondary | ICD-10-CM | POA: Diagnosis not present

## 2015-02-03 DIAGNOSIS — M5136 Other intervertebral disc degeneration, lumbar region: Secondary | ICD-10-CM | POA: Diagnosis not present

## 2015-02-03 DIAGNOSIS — M1990 Unspecified osteoarthritis, unspecified site: Secondary | ICD-10-CM | POA: Diagnosis not present

## 2015-02-03 MED ORDER — FUROSEMIDE 40 MG PO TABS: 40.0000 mg | ORAL_TABLET | Freq: Every morning | ORAL | Status: DC

## 2015-02-03 MED ORDER — ATORVASTATIN CALCIUM 40 MG PO TABS: 40.0000 mg | ORAL_TABLET | Freq: Every evening | ORAL | Status: DC

## 2015-02-03 MED ORDER — PANTOPRAZOLE SODIUM 40 MG PO TBEC: 40.0000 mg | DELAYED_RELEASE_TABLET | Freq: Two times a day (BID) | ORAL | Status: DC

## 2015-02-03 MED ORDER — ONDANSETRON HCL 4 MG PO TABS: 4.0000 mg | ORAL_TABLET | Freq: Three times a day (TID) | ORAL | Status: DC | PRN

## 2015-02-03 MED ORDER — CLONIDINE HCL 0.1 MG PO TABS: 0.1000 mg | ORAL_TABLET | Freq: Two times a day (BID) | ORAL | Status: DC

## 2015-02-03 MED ORDER — LEVOTHYROXINE SODIUM 75 MCG PO TABS: 75.0000 ug | ORAL_TABLET | Freq: Every day | ORAL | Status: DC

## 2015-02-03 MED ORDER — POTASSIUM CHLORIDE CRYS ER 20 MEQ PO TBCR: 20.0000 meq | EXTENDED_RELEASE_TABLET | Freq: Two times a day (BID) | ORAL | Status: DC

## 2015-02-03 MED ORDER — METOPROLOL SUCCINATE ER 50 MG PO TB24: 50.0000 mg | ORAL_TABLET | Freq: Every morning | ORAL | Status: DC

## 2015-02-03 MED ORDER — LOSARTAN POTASSIUM 100 MG PO TABS: 100.0000 mg | ORAL_TABLET | Freq: Every morning | ORAL | Status: DC

## 2015-02-03 NOTE — Progress Notes (Signed)
   Subjective:    Patient ID: [name], female    DOB: 1945/06/30, 69 y.o.   MRN: [id number]  HPI: Pt here for follow up after hospital admission at [facility] last Thursday. States she was at home and her blood sugar dropped to 23. Pt was admitted and then discharged on Friday. Reports they stopped her Pt was told to stop taking the Glimepiride. Pt reports she has been hospitalized in the past for hypoglycemia.  *Also reports she as an area on her right foot where she pulled some dry skin off. Denies any pain.     Review of Systems  Respiratory: Negative.   Cardiovascular: Negative.   Gastrointestinal: Negative.   Genitourinary: Negative.   Musculoskeletal: Negative.        Objective:   Physical Exam  Constitutional: She appears well-developed and well-nourished.  HENT:  Nose: Nose normal.  Mouth/Throat: Oropharynx is clear and moist.  Eyes: EOM are normal. Pupils are equal, round, and reactive to light.  Neck: Normal range of motion. Neck supple.  Cardiovascular: Normal rate and regular rhythm.   Pulmonary/Chest: Effort normal and breath sounds normal.  Abdominal: Soft. Bowel sounds are normal.  Skin:  Broke skin to the medial surface of the right heel. Slight redness but no drainage. No pain with palpation.   Psychiatric: She has a normal mood and affect. Her behavior is normal. Judgment and thought content normal.      BP 222/89 mmHg  Pulse 83  Temp(Src) 97.7 F (36.5 C) (Oral)  Ht 5\' 2"  (1.575 m)  Wt 171 lb (77.565 kg)  BMI 31.27 kg/m2     Assessment & Plan:   1. Hospital discharge follow-up   2. Hypoglycemia   3. Decubitus ulcer of heel, right, stage II   4. Hypokalemia   5. Gastroesophageal reflux disease with esophagitis   6. Essential hypertension   7. Hypothyroidism due to acquired atrophy of thyroid   8. Peripheral edema   9. Hyperlipidemia with target LDL less than 100    Orders Placed This Encounter  Procedures  . AMB referral to  wound care center    Referral Priority:  Urgent    Referral Type:  Consultation    Number of Visits Requested:  1   Do not put any pressure on heel Keep area clean and dry All meds were filled Hospital records reviewed RTO in 3 months follow up  [place], FNP

## 2015-02-05 DIAGNOSIS — E119 Type 2 diabetes mellitus without complications: Secondary | ICD-10-CM | POA: Diagnosis not present

## 2015-02-05 DIAGNOSIS — I10 Essential (primary) hypertension: Secondary | ICD-10-CM | POA: Diagnosis not present

## 2015-02-05 DIAGNOSIS — M5136 Other intervertebral disc degeneration, lumbar region: Secondary | ICD-10-CM | POA: Diagnosis not present

## 2015-02-05 DIAGNOSIS — K603 Anal fistula: Secondary | ICD-10-CM | POA: Diagnosis not present

## 2015-02-05 DIAGNOSIS — M1990 Unspecified osteoarthritis, unspecified site: Secondary | ICD-10-CM | POA: Diagnosis not present

## 2015-02-05 DIAGNOSIS — R32 Unspecified urinary incontinence: Secondary | ICD-10-CM | POA: Diagnosis not present

## 2015-02-07 DIAGNOSIS — M1990 Unspecified osteoarthritis, unspecified site: Secondary | ICD-10-CM | POA: Diagnosis not present

## 2015-02-07 DIAGNOSIS — I10 Essential (primary) hypertension: Secondary | ICD-10-CM | POA: Diagnosis not present

## 2015-02-07 DIAGNOSIS — E119 Type 2 diabetes mellitus without complications: Secondary | ICD-10-CM | POA: Diagnosis not present

## 2015-02-07 DIAGNOSIS — K603 Anal fistula: Secondary | ICD-10-CM | POA: Diagnosis not present

## 2015-02-07 DIAGNOSIS — R32 Unspecified urinary incontinence: Secondary | ICD-10-CM | POA: Diagnosis not present

## 2015-02-07 DIAGNOSIS — M5136 Other intervertebral disc degeneration, lumbar region: Secondary | ICD-10-CM | POA: Diagnosis not present

## 2015-02-07 DIAGNOSIS — N183 Chronic kidney disease, stage 3 (moderate): Secondary | ICD-10-CM | POA: Diagnosis not present

## 2015-02-07 DIAGNOSIS — D631 Anemia in chronic kidney disease: Secondary | ICD-10-CM | POA: Diagnosis not present

## 2015-02-07 DIAGNOSIS — N184 Chronic kidney disease, stage 4 (severe): Secondary | ICD-10-CM | POA: Diagnosis not present

## 2015-02-07 DIAGNOSIS — R809 Proteinuria, unspecified: Secondary | ICD-10-CM | POA: Diagnosis not present

## 2015-02-07 DIAGNOSIS — I129 Hypertensive chronic kidney disease with stage 1 through stage 4 chronic kidney disease, or unspecified chronic kidney disease: Secondary | ICD-10-CM | POA: Diagnosis not present

## 2015-02-07 DIAGNOSIS — N2581 Secondary hyperparathyroidism of renal origin: Secondary | ICD-10-CM | POA: Diagnosis not present

## 2015-02-10 ENCOUNTER — Encounter (HOSPITAL_COMMUNITY)
Admission: RE | Admit: 2015-02-10 | Discharge: 2015-02-10 | Disposition: A | Discharge status: Home / Self Care | Payer: Medicare Other | Source: Ambulatory Visit | Attending: Nephrology | Admitting: Nephrology

## 2015-02-10 ENCOUNTER — Encounter (HOSPITAL_COMMUNITY): Payer: Self-pay

## 2015-02-10 DIAGNOSIS — N183 Chronic kidney disease, stage 3 (moderate): Secondary | ICD-10-CM | POA: Diagnosis not present

## 2015-02-10 DIAGNOSIS — D638 Anemia in other chronic diseases classified elsewhere: Secondary | ICD-10-CM | POA: Diagnosis not present

## 2015-02-10 LAB — HEMOGLOBIN AND HEMATOCRIT, BLOOD
HCT: 36.6 % (ref 36.0–46.0)
HEMOGLOBIN: 12 g/dL (ref 12.0–15.0)

## 2015-02-10 MED ORDER — DARBEPOETIN ALFA 100 MCG/0.5ML IJ SOSY: 100.0000 ug | PREFILLED_SYRINGE | INTRAMUSCULAR | Status: DC

## 2015-02-10 NOTE — Progress Notes (Signed)
Results for CHELCE, NOTCH (MRN [id number]) as of 02/10/2015 14:18  Ref. Range 02/10/2015 10:15  Hemoglobin Latest Ref Range: 12.0-15.0 g/dL 12.0  HCT Latest Ref Range: 36.0-46.0 % 36.6

## 2015-02-11 DIAGNOSIS — E119 Type 2 diabetes mellitus without complications: Secondary | ICD-10-CM | POA: Diagnosis not present

## 2015-02-11 DIAGNOSIS — R32 Unspecified urinary incontinence: Secondary | ICD-10-CM | POA: Diagnosis not present

## 2015-02-11 DIAGNOSIS — K603 Anal fistula: Secondary | ICD-10-CM | POA: Diagnosis not present

## 2015-02-11 DIAGNOSIS — M5136 Other intervertebral disc degeneration, lumbar region: Secondary | ICD-10-CM | POA: Diagnosis not present

## 2015-02-11 DIAGNOSIS — M1990 Unspecified osteoarthritis, unspecified site: Secondary | ICD-10-CM | POA: Diagnosis not present

## 2015-02-11 DIAGNOSIS — I10 Essential (primary) hypertension: Secondary | ICD-10-CM | POA: Diagnosis not present

## 2015-02-13 DIAGNOSIS — M5136 Other intervertebral disc degeneration, lumbar region: Secondary | ICD-10-CM | POA: Diagnosis not present

## 2015-02-13 DIAGNOSIS — M545 Low back pain: Secondary | ICD-10-CM | POA: Diagnosis not present

## 2015-02-13 DIAGNOSIS — M1990 Unspecified osteoarthritis, unspecified site: Secondary | ICD-10-CM | POA: Diagnosis not present

## 2015-02-13 DIAGNOSIS — M47896 Other spondylosis, lumbar region: Secondary | ICD-10-CM | POA: Diagnosis not present

## 2015-02-13 DIAGNOSIS — R32 Unspecified urinary incontinence: Secondary | ICD-10-CM | POA: Diagnosis not present

## 2015-02-13 DIAGNOSIS — E119 Type 2 diabetes mellitus without complications: Secondary | ICD-10-CM | POA: Diagnosis not present

## 2015-02-13 DIAGNOSIS — M4316 Spondylolisthesis, lumbar region: Secondary | ICD-10-CM | POA: Diagnosis not present

## 2015-02-13 DIAGNOSIS — K603 Anal fistula: Secondary | ICD-10-CM | POA: Diagnosis not present

## 2015-02-13 DIAGNOSIS — I10 Essential (primary) hypertension: Secondary | ICD-10-CM | POA: Diagnosis not present

## 2015-02-14 DIAGNOSIS — M1990 Unspecified osteoarthritis, unspecified site: Secondary | ICD-10-CM | POA: Diagnosis not present

## 2015-02-14 DIAGNOSIS — E119 Type 2 diabetes mellitus without complications: Secondary | ICD-10-CM | POA: Diagnosis not present

## 2015-02-14 DIAGNOSIS — M5136 Other intervertebral disc degeneration, lumbar region: Secondary | ICD-10-CM | POA: Diagnosis not present

## 2015-02-14 DIAGNOSIS — I10 Essential (primary) hypertension: Secondary | ICD-10-CM | POA: Diagnosis not present

## 2015-02-14 DIAGNOSIS — K603 Anal fistula: Secondary | ICD-10-CM | POA: Diagnosis not present

## 2015-02-14 DIAGNOSIS — R32 Unspecified urinary incontinence: Secondary | ICD-10-CM | POA: Diagnosis not present

## 2015-02-17 ENCOUNTER — Encounter (HOSPITAL_BASED_OUTPATIENT_CLINIC_OR_DEPARTMENT_OTHER): Payer: Medicare Other | Attending: Plastic Surgery

## 2015-02-17 DIAGNOSIS — X58XXXS Exposure to other specified factors, sequela: Secondary | ICD-10-CM | POA: Diagnosis not present

## 2015-02-17 DIAGNOSIS — S31809S Unspecified open wound of unspecified buttock, sequela: Secondary | ICD-10-CM | POA: Diagnosis not present

## 2015-02-17 DIAGNOSIS — L89622 Pressure ulcer of left heel, stage 2: Secondary | ICD-10-CM | POA: Insufficient documentation

## 2015-02-17 DIAGNOSIS — K604 Rectal fistula: Secondary | ICD-10-CM | POA: Insufficient documentation

## 2015-02-17 DIAGNOSIS — E1321 Other specified diabetes mellitus with diabetic nephropathy: Secondary | ICD-10-CM | POA: Insufficient documentation

## 2015-02-17 DIAGNOSIS — M1990 Unspecified osteoarthritis, unspecified site: Secondary | ICD-10-CM | POA: Diagnosis not present

## 2015-02-17 DIAGNOSIS — E119 Type 2 diabetes mellitus without complications: Secondary | ICD-10-CM | POA: Diagnosis not present

## 2015-02-17 DIAGNOSIS — I10 Essential (primary) hypertension: Secondary | ICD-10-CM | POA: Diagnosis not present

## 2015-02-17 DIAGNOSIS — R32 Unspecified urinary incontinence: Secondary | ICD-10-CM | POA: Diagnosis not present

## 2015-02-17 DIAGNOSIS — M5136 Other intervertebral disc degeneration, lumbar region: Secondary | ICD-10-CM | POA: Diagnosis not present

## 2015-02-17 DIAGNOSIS — K603 Anal fistula: Secondary | ICD-10-CM | POA: Diagnosis not present

## 2015-02-18 DIAGNOSIS — I10 Essential (primary) hypertension: Secondary | ICD-10-CM | POA: Diagnosis not present

## 2015-02-18 DIAGNOSIS — M1990 Unspecified osteoarthritis, unspecified site: Secondary | ICD-10-CM | POA: Diagnosis not present

## 2015-02-18 DIAGNOSIS — K603 Anal fistula: Secondary | ICD-10-CM | POA: Diagnosis not present

## 2015-02-18 DIAGNOSIS — E119 Type 2 diabetes mellitus without complications: Secondary | ICD-10-CM | POA: Diagnosis not present

## 2015-02-18 DIAGNOSIS — M5136 Other intervertebral disc degeneration, lumbar region: Secondary | ICD-10-CM | POA: Diagnosis not present

## 2015-02-18 DIAGNOSIS — R32 Unspecified urinary incontinence: Secondary | ICD-10-CM | POA: Diagnosis not present

## 2015-02-19 DIAGNOSIS — M5136 Other intervertebral disc degeneration, lumbar region: Secondary | ICD-10-CM | POA: Diagnosis not present

## 2015-02-19 DIAGNOSIS — M1990 Unspecified osteoarthritis, unspecified site: Secondary | ICD-10-CM | POA: Diagnosis not present

## 2015-02-19 DIAGNOSIS — R32 Unspecified urinary incontinence: Secondary | ICD-10-CM | POA: Diagnosis not present

## 2015-02-19 DIAGNOSIS — K603 Anal fistula: Secondary | ICD-10-CM | POA: Diagnosis not present

## 2015-02-19 DIAGNOSIS — I10 Essential (primary) hypertension: Secondary | ICD-10-CM | POA: Diagnosis not present

## 2015-02-19 DIAGNOSIS — E119 Type 2 diabetes mellitus without complications: Secondary | ICD-10-CM | POA: Diagnosis not present

## 2015-02-21 DIAGNOSIS — R32 Unspecified urinary incontinence: Secondary | ICD-10-CM | POA: Diagnosis not present

## 2015-02-21 DIAGNOSIS — M5136 Other intervertebral disc degeneration, lumbar region: Secondary | ICD-10-CM | POA: Diagnosis not present

## 2015-02-21 DIAGNOSIS — E119 Type 2 diabetes mellitus without complications: Secondary | ICD-10-CM | POA: Diagnosis not present

## 2015-02-21 DIAGNOSIS — K603 Anal fistula: Secondary | ICD-10-CM | POA: Diagnosis not present

## 2015-02-21 DIAGNOSIS — I10 Essential (primary) hypertension: Secondary | ICD-10-CM | POA: Diagnosis not present

## 2015-02-21 DIAGNOSIS — M1990 Unspecified osteoarthritis, unspecified site: Secondary | ICD-10-CM | POA: Diagnosis not present

## 2015-02-24 ENCOUNTER — Telehealth: Payer: Self-pay | Admitting: Nurse Practitioner

## 2015-02-24 ENCOUNTER — Encounter (HOSPITAL_COMMUNITY)
Admission: RE | Admit: 2015-02-24 | Discharge: 2015-02-24 | Disposition: A | Discharge status: Home / Self Care | Payer: Medicare Other | Source: Ambulatory Visit | Attending: Nephrology | Admitting: Nephrology

## 2015-02-24 ENCOUNTER — Encounter (INDEPENDENT_AMBULATORY_CARE_PROVIDER_SITE_OTHER): Payer: Medicare Other | Admitting: Nurse Practitioner

## 2015-02-24 DIAGNOSIS — M5136 Other intervertebral disc degeneration, lumbar region: Secondary | ICD-10-CM | POA: Diagnosis not present

## 2015-02-24 DIAGNOSIS — K603 Anal fistula: Secondary | ICD-10-CM | POA: Diagnosis not present

## 2015-02-24 DIAGNOSIS — D638 Anemia in other chronic diseases classified elsewhere: Secondary | ICD-10-CM | POA: Diagnosis not present

## 2015-02-24 DIAGNOSIS — N183 Chronic kidney disease, stage 3 (moderate): Secondary | ICD-10-CM | POA: Insufficient documentation

## 2015-02-24 DIAGNOSIS — E162 Hypoglycemia, unspecified: Secondary | ICD-10-CM | POA: Diagnosis not present

## 2015-02-24 DIAGNOSIS — E119 Type 2 diabetes mellitus without complications: Secondary | ICD-10-CM | POA: Diagnosis not present

## 2015-02-24 DIAGNOSIS — I10 Essential (primary) hypertension: Secondary | ICD-10-CM | POA: Diagnosis not present

## 2015-02-24 DIAGNOSIS — M1990 Unspecified osteoarthritis, unspecified site: Secondary | ICD-10-CM | POA: Diagnosis not present

## 2015-02-24 DIAGNOSIS — R32 Unspecified urinary incontinence: Secondary | ICD-10-CM | POA: Diagnosis not present

## 2015-02-24 LAB — IRON AND TIBC
IRON: 52 ug/dL (ref 28–170)
SATURATION RATIOS: 23 % (ref 10.4–31.8)
TIBC: 231 ug/dL — AB (ref 250–450)
UIBC: 179 ug/dL

## 2015-02-24 LAB — BASIC METABOLIC PANEL
ANION GAP: 9 (ref 5–15)
BUN: 26 mg/dL — ABNORMAL HIGH (ref 6–20)
CALCIUM: 8.4 mg/dL — AB (ref 8.9–10.3)
CO2: 22 mmol/L (ref 22–32)
Chloride: 99 mmol/L — ABNORMAL LOW (ref 101–111)
Creatinine, Ser: 1.71 mg/dL — ABNORMAL HIGH (ref 0.44–1.00)
GFR calc Af Amer: 34 mL/min — ABNORMAL LOW (ref >60)
GFR calc non Af Amer: 29 mL/min — ABNORMAL LOW (ref >60)
GLUCOSE: 605 mg/dL — AB (ref 65–99)
Potassium: 5.3 mmol/L — ABNORMAL HIGH (ref 3.5–5.1)
Sodium: 130 mmol/L — ABNORMAL LOW (ref 135–145)

## 2015-02-24 LAB — HEMOGLOBIN AND HEMATOCRIT, BLOOD
HCT: 35.7 % — ABNORMAL LOW (ref 36.0–46.0)
Hemoglobin: 12 g/dL (ref 12.0–15.0)

## 2015-02-24 LAB — FERRITIN: FERRITIN: 548 ng/mL — AB (ref 11–307)

## 2015-02-24 LAB — GLUCOSE, POCT (MANUAL RESULT ENTRY): POC Glucose: 441 mg/dl — AB (ref 70–99)

## 2015-02-24 MED ORDER — CLONIDINE HCL 0.1 MG PO TABS
0.1000 mg | ORAL_TABLET | Freq: Once | ORAL | Status: DC
  Filled 2015-02-24: qty 1

## 2015-02-24 NOTE — Telephone Encounter (Signed)
Patient was seen this evening in PM clinic and will return in AM

## 2015-02-24 NOTE — Telephone Encounter (Signed)
Patient went to ER for labs this morning and they called her and told her her blood sugar was over 500. Home health nurse is coming out to check it because patient says she is to nervous to check it herself. They will call me back- patient has recently stopped her amaryl because of low blood sugars.

## 2015-02-24 NOTE — Progress Notes (Signed)
Spoke with Dr. Etheleen Nicks nurse regarding glucose 605.  Nurse was going to let MD know of glucose.  Labs faxed to office.

## 2015-02-24 NOTE — Progress Notes (Signed)
Results for [name], [name] (MRN [id number]) as of 02/24/2015 10:37  Ref. Range 02/24/2015 10:00  Sodium Latest Ref Range: 135-145 mmol/L 130 (L)  Potassium Latest Ref Range: 3.5-5.1 mmol/L 5.3 (H)  Chloride Latest Ref Range: 101-111 mmol/L 99 (L)  CO2 Latest Ref Range: 22-32 mmol/L 22  BUN Latest Ref Range: 6-20 mg/dL 26 (H)  Creatinine Latest Ref Range: 0.44-1.00 mg/dL 1.71 (H)  Calcium Latest Ref Range: 8.9-10.3 mg/dL 8.4 (L)  EGFR (Non-African Amer.) Latest Ref Range: >60 mL/min 29 (L)  EGFR (African American) Latest Ref Range: >60 mL/min 34 (L)  Glucose Latest Ref Range: 65-99 mg/dL 605 (HH)  Anion gap Latest Ref Range: 5-15  9  Hemoglobin Latest Ref Range: 12.0-15.0 g/dL 12.0  HCT Latest Ref Range: 36.0-46.0 % 35.7 (L)

## 2015-02-24 NOTE — Progress Notes (Signed)
Patient ID: [name], female   DOB: Jan 05, 1946, 69 y.o.   MRN: [id number] Erroneous encounter patient just wanted blood sugar checked and was 440 but is coming down on meds- will come by to recheck in AM

## 2015-02-25 ENCOUNTER — Other Ambulatory Visit: Payer: Medicare Other

## 2015-02-25 NOTE — Progress Notes (Signed)
Lab only 

## 2015-02-25 NOTE — Progress Notes (Signed)
Results for MERRITT, KIBBY (MRN [id number]) as of 02/25/2015 08:32  Ref. Range 02/24/2015 10:00 02/24/2015 18:02  Sodium Latest Ref Range: 135-145 mmol/L 130 (L)   Potassium Latest Ref Range: 3.5-5.1 mmol/L 5.3 (H)   Chloride Latest Ref Range: 101-111 mmol/L 99 (L)   CO2 Latest Ref Range: 22-32 mmol/L 22   BUN Latest Ref Range: 6-20 mg/dL 26 (H)   Creatinine Latest Ref Range: 0.44-1.00 mg/dL 1.71 (H)   Calcium Latest Ref Range: 8.9-10.3 mg/dL 8.4 (L)   EGFR (Non-African Amer.) Latest Ref Range: >60 mL/min 29 (L)   EGFR (African American) Latest Ref Range: >60 mL/min 34 (L)   Glucose Latest Ref Range: 65-99 mg/dL 605 (HH)   Anion gap Latest Ref Range: 5-15  9   Iron Latest Ref Range: 28-170 ug/dL 52   UIBC Latest Units: ug/dL 179   TIBC Latest Ref Range: 250-450 ug/dL 231 (L)   Saturation Ratios Latest Ref Range: 10.4-31.8 % 23   Ferritin Latest Ref Range: 11-307 ng/mL 548 (H)   Hemoglobin Latest Ref Range: 12.0-15.0 g/dL 12.0   HCT Latest Ref Range: 36.0-46.0 % 35.7 (L)   POC Glucose Latest Ref Range: 70-99 mg/dl  441 (A)

## 2015-02-25 NOTE — Progress Notes (Signed)
Results for [name], [name] (MRN [id number]) as of 02/25/2015 08:32  Ref. Range 02/24/2015 10:00 02/24/2015 18:02  Sodium Latest Ref Range: 135-145 mmol/L 130 (L)   Potassium Latest Ref Range: 3.5-5.1 mmol/L 5.3 (H)   Chloride Latest Ref Range: 101-111 mmol/L 99 (L)   CO2 Latest Ref Range: 22-32 mmol/L 22   BUN Latest Ref Range: 6-20 mg/dL 26 (H)   Creatinine Latest Ref Range: 0.44-1.00 mg/dL 1.71 (H)   Calcium Latest Ref Range: 8.9-10.3 mg/dL 8.4 (L)   EGFR (Non-African Amer.) Latest Ref Range: >60 mL/min 29 (L)   EGFR (African American) Latest Ref Range: >60 mL/min 34 (L)   Glucose Latest Ref Range: 65-99 mg/dL 605 (HH)   Anion gap Latest Ref Range: 5-15  9   Iron Latest Ref Range: 28-170 ug/dL 52   UIBC Latest Units: ug/dL 179   TIBC Latest Ref Range: 250-450 ug/dL 231 (L)   Saturation Ratios Latest Ref Range: 10.4-31.8 % 23   Ferritin Latest Ref Range: 11-307 ng/mL 548 (H)   Hemoglobin Latest Ref Range: 12.0-15.0 g/dL 12.0   HCT Latest Ref Range: 36.0-46.0 % 35.7 (L)   POC Glucose Latest Ref Range: 70-99 mg/dl  441 (A)

## 2015-02-26 ENCOUNTER — Encounter (HOSPITAL_COMMUNITY): Payer: Self-pay | Admitting: Emergency Medicine

## 2015-02-26 ENCOUNTER — Other Ambulatory Visit: Payer: Self-pay

## 2015-02-26 ENCOUNTER — Telehealth: Payer: Self-pay | Admitting: *Deleted

## 2015-02-26 ENCOUNTER — Emergency Department (HOSPITAL_COMMUNITY)
Admission: EM | Admit: 2015-02-26 | Discharge: 2015-02-26 | Disposition: A | Discharge status: Home / Self Care | Payer: Medicare Other | Attending: Emergency Medicine | Admitting: Emergency Medicine

## 2015-02-26 DIAGNOSIS — I10 Essential (primary) hypertension: Secondary | ICD-10-CM | POA: Diagnosis not present

## 2015-02-26 DIAGNOSIS — I2510 Atherosclerotic heart disease of native coronary artery without angina pectoris: Secondary | ICD-10-CM | POA: Insufficient documentation

## 2015-02-26 DIAGNOSIS — F411 Generalized anxiety disorder: Secondary | ICD-10-CM | POA: Insufficient documentation

## 2015-02-26 DIAGNOSIS — E039 Hypothyroidism, unspecified: Secondary | ICD-10-CM | POA: Diagnosis not present

## 2015-02-26 DIAGNOSIS — M5136 Other intervertebral disc degeneration, lumbar region: Secondary | ICD-10-CM | POA: Diagnosis not present

## 2015-02-26 DIAGNOSIS — K603 Anal fistula: Secondary | ICD-10-CM | POA: Diagnosis not present

## 2015-02-26 DIAGNOSIS — E11319 Type 2 diabetes mellitus with unspecified diabetic retinopathy without macular edema: Secondary | ICD-10-CM | POA: Diagnosis not present

## 2015-02-26 DIAGNOSIS — E1310 Other specified diabetes mellitus with ketoacidosis without coma: Secondary | ICD-10-CM

## 2015-02-26 DIAGNOSIS — E119 Type 2 diabetes mellitus without complications: Secondary | ICD-10-CM | POA: Diagnosis not present

## 2015-02-26 DIAGNOSIS — Z86010 Personal history of colonic polyps: Secondary | ICD-10-CM | POA: Diagnosis not present

## 2015-02-26 DIAGNOSIS — M1990 Unspecified osteoarthritis, unspecified site: Secondary | ICD-10-CM | POA: Diagnosis not present

## 2015-02-26 DIAGNOSIS — Z872 Personal history of diseases of the skin and subcutaneous tissue: Secondary | ICD-10-CM | POA: Insufficient documentation

## 2015-02-26 DIAGNOSIS — Z79899 Other long term (current) drug therapy: Secondary | ICD-10-CM | POA: Insufficient documentation

## 2015-02-26 DIAGNOSIS — E1165 Type 2 diabetes mellitus with hyperglycemia: Secondary | ICD-10-CM | POA: Diagnosis not present

## 2015-02-26 DIAGNOSIS — E785 Hyperlipidemia, unspecified: Secondary | ICD-10-CM | POA: Insufficient documentation

## 2015-02-26 DIAGNOSIS — R32 Unspecified urinary incontinence: Secondary | ICD-10-CM | POA: Diagnosis not present

## 2015-02-26 DIAGNOSIS — E1143 Type 2 diabetes mellitus with diabetic autonomic (poly)neuropathy: Secondary | ICD-10-CM | POA: Insufficient documentation

## 2015-02-26 DIAGNOSIS — R739 Hyperglycemia, unspecified: Secondary | ICD-10-CM

## 2015-02-26 DIAGNOSIS — N183 Chronic kidney disease, stage 3 (moderate): Secondary | ICD-10-CM | POA: Diagnosis not present

## 2015-02-26 DIAGNOSIS — K219 Gastro-esophageal reflux disease without esophagitis: Secondary | ICD-10-CM | POA: Diagnosis not present

## 2015-02-26 DIAGNOSIS — I129 Hypertensive chronic kidney disease with stage 1 through stage 4 chronic kidney disease, or unspecified chronic kidney disease: Secondary | ICD-10-CM | POA: Insufficient documentation

## 2015-02-26 LAB — URINALYSIS, ROUTINE W REFLEX MICROSCOPIC
BILIRUBIN URINE: NEGATIVE
Glucose, UA: 250 mg/dL — AB
KETONES UR: NEGATIVE mg/dL
NITRITE: NEGATIVE
PH: 5.5 (ref 5.0–8.0)
Protein, ur: NEGATIVE mg/dL
SPECIFIC GRAVITY, URINE: 1.02 (ref 1.005–1.030)
UROBILINOGEN UA: 0.2 mg/dL (ref 0.0–1.0)

## 2015-02-26 LAB — BASIC METABOLIC PANEL
ANION GAP: 8 (ref 5–15)
BUN: 29 mg/dL — ABNORMAL HIGH (ref 6–20)
CO2: 22 mmol/L (ref 22–32)
Calcium: 8.1 mg/dL — ABNORMAL LOW (ref 8.9–10.3)
Chloride: 102 mmol/L (ref 101–111)
Creatinine, Ser: 1.59 mg/dL — ABNORMAL HIGH (ref 0.44–1.00)
GFR, EST AFRICAN AMERICAN: 37 mL/min — AB (ref >60)
GFR, EST NON AFRICAN AMERICAN: 32 mL/min — AB (ref >60)
Glucose, Bld: 473 mg/dL — ABNORMAL HIGH (ref 65–99)
POTASSIUM: 5 mmol/L (ref 3.5–5.1)
SODIUM: 132 mmol/L — AB (ref 135–145)

## 2015-02-26 LAB — CBC
HEMATOCRIT: 34.8 % — AB (ref 36.0–46.0)
HEMOGLOBIN: 11.9 g/dL — AB (ref 12.0–15.0)
MCH: 30.7 pg (ref 26.0–34.0)
MCHC: 34.2 g/dL (ref 30.0–36.0)
MCV: 89.7 fL (ref 78.0–100.0)
Platelets: 194 10*3/uL (ref 150–400)
RBC: 3.88 MIL/uL (ref 3.87–5.11)
RDW: 13 % (ref 11.5–15.5)
WBC: 9.8 10*3/uL (ref 4.0–10.5)

## 2015-02-26 LAB — CBG MONITORING, ED
GLUCOSE-CAPILLARY: 482 mg/dL — AB (ref 65–99)
GLUCOSE-CAPILLARY: 323 mg/dL — AB (ref 65–99)
Glucose-Capillary: 409 mg/dL — ABNORMAL HIGH (ref 65–99)

## 2015-02-26 LAB — URINE MICROSCOPIC-ADD ON

## 2015-02-26 MED ORDER — INSULIN ASPART 100 UNIT/ML ~~LOC~~ SOLN
4.0000 [IU] | Freq: Once | SUBCUTANEOUS | Status: AC
  Administered 2015-02-26: 4 [IU] via SUBCUTANEOUS
  Filled 2015-02-26: qty 1

## 2015-02-26 NOTE — Telephone Encounter (Signed)
FYI:   Physical therapist with [facility] went to patients house this AM to do therapy. When she arrived patient told her that she ate breakfast at 9am. She has biscuits and gravy. She took her diabetic pill right after eating. She checked her BS at 10 and it ready 446. She took an additional pill at 10:30 because it was so high and she thought it would help. She checked it again at 11:45 and it was 556. I had her check it while she was on the phone with me at 11:55 and it had came down to 540. Advised patient that since she had been off meds for so long and unable to regulate to go to ER for evaluation. Son agreed with this option and agreed to take her. Advised to call office back if any further assistance needed.

## 2015-02-26 NOTE — Telephone Encounter (Signed)
Call from the ED, pt is there with elevated blood sugars, going to bring it down. She is to continue her amaryl 4mg  BID and we will set her up to see our pharmacist here for DM2 med adjustment this week. Her CKD is likely contributing to intermittent lows on the glimeperide.

## 2015-02-26 NOTE — Telephone Encounter (Signed)
Agree with going to hospital

## 2015-02-26 NOTE — ED Provider Notes (Signed)
CSN: [id number]     Arrival date & time 02/26/15  1242 History   First MD Initiated Contact with Patient 02/26/15 1523     Chief Complaint  Patient presents with  . Hyperglycemia     (Consider location/radiation/quality/duration/timing/severity/associated sxs/prior Treatment) Patient is a 69 y.o. female presenting with hyperglycemia. The history is provided by the patient.  Hyperglycemia Associated symptoms: no abdominal pain, no chest pain, no shortness of breath and no weakness    patient has elevated blood sugars at home. Up to 4 and 500s. Around a month ago she had hypoglycemia and he stopped her Amaryl. It is since been restarted. No dysuria. No frequency. No lightheadedness or dizziness. She has a history of diabetes. States she cannot take most medicines because she has bad kidneys.  Past Medical History  Diagnosis Date  . Hypertension   . Hyperlipidemia   . GERD (gastroesophageal reflux disease)   . GAD (generalized anxiety disorder)   . Hypothyroidism   . Type II diabetes mellitus ([place])   . Right bundle branch block   . LAFB (left anterior fascicular block)   . History of rectal abscess     12-29-2004  bedside I & D  . History of GI bleed     upper 2009  due to esophagitis  &  2002  due to Mallory-Weiss tear  . History of colon polyps     benign  . Diverticulosis of colon   . History of esophagitis   . Diabetic gastroparesis ([place])   . History of gout     in issues for several years  . Sacral decubitus ulcer     since 2014- 01-02-15 remains with wound" gauze dressing changes daily.  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 Coronary artery disease   . Complication of anesthesia     post-op confusion   . CKD (chronic kidney disease), stage III     nephrologist--  dr Mercy Moore  . Diabetic retinopathy ([place])     bilateral --  monitored by dr [name]  . Arthritis     knees and hand/fingers. "broke back"-being evaluated for this"weakness left leg"   . Anemia in chronic renal disease     Aranesp injection --  when Hg <11, last injection 12-24-14.  Marland Kitchen Anxiety    Past Surgical History  Procedure Laterality Date  . Retinopathy surgery Bilateral 1980's?  . Orif femur fracture Left 10/09/2012    Procedure: OPEN REDUCTION INTERNAL FIXATION (ORIF) DISTAL FEMUR FRACTURE;  Surgeon: Rozanna Box, MD;  Location: [facility];  Service: Orthopedics;  Laterality: Left;  . Compression hip screw Right 05/18/2014    Procedure: COMPRESSION HIP;  Surgeon: Carole Civil, MD;  Location: [facility];  Service: Orthopedics;  Laterality: Right;  . Esophagogastroduodenoscopy  last one 01-09-2011  . Transthoracic echocardiogram  02-18-2011    mild LVH,  ef 55-60%,  grade I diastolic dysfunction/  mild TR/  RV systolic pressure increased consistant with moderate pulmonary hypertension  . Cardiovascular stress test  12-30-2004    normal perfusion study/  no ischemia or infartion/  normal LV wall function and wall motion , ef 66%  . Colonoscopy w/ polypectomy  last one 2008  . Cataract extraction w/ intraocular lens  implant, bilateral  1995  . Incision and drainage of wound N/A 09/30/2014    Procedure: IRRIGATION AND DEBRIDEMENT SACRAL WOUND, EXCISION OF PERIRECTAL TRACT WITH PLACEMENT OF ACCELL;  Surgeon: Theodoro Kos, DO;  Location: [facility];  Service: Plastics;  Laterality: N/A;  . Evaluation under anesthesia with fistulectomy N/A 01/06/2015    Procedure: EXAM UNDER ANESTHESIA , placement of seton;  Surgeon: Jackolyn Confer, MD;  Location: [facility];  Service: General;  Laterality: N/A;   Family History  Problem Relation Age of Onset  . Colon cancer Father   . Prostate cancer Father   . Diabetes Father   . Coronary artery disease Mother 65  . Heart disease Mother   . Diabetes Mother   . Coronary artery disease Sister 17  . Diabetes Sister   . Heart disease Sister   . Diabetes Maternal Grandmother   . Diabetes Sister   . Diabetes Sister   .  Diabetes Sister   . Diabetes Sister   . Diabetes Sister    Social History  Substance Use Topics  . Smoking status: Never Smoker   . Smokeless tobacco: Never Used  . Alcohol Use: No   OB History    No data available     Review of Systems  Constitutional: Negative for activity change and appetite change.  Eyes: Negative for pain.  Respiratory: Negative for chest tightness and shortness of breath.   Cardiovascular: Negative for chest pain.  Gastrointestinal: Negative for abdominal pain and diarrhea.  Genitourinary: Negative for flank pain.  Musculoskeletal: Negative for back pain and neck stiffness.  Skin: Positive for wound. Negative for rash.  Neurological: Negative for weakness, numbness and headaches.  Psychiatric/Behavioral: Negative for behavioral problems.      Allergies  Aspirin; Ciprofloxacin; Codeine; Micardis; Other; and Rofecoxib  Home Medications   Prior to Admission medications   Medication Sig Start Date End Date Taking? Authorizing Provider  atorvastatin (LIPITOR) 40 MG tablet Take 1 tablet (40 mg total) by mouth every evening. 02/03/15  Yes Mary-Margaret Hassell Done, FNP  calcitRIOL (ROCALTROL) 0.25 MCG capsule Take 1 capsule (0.25 mcg total) by mouth daily. 01/24/15  Yes Lezlie Octave Black, NP  cloNIDine (CATAPRES) 0.1 MG tablet Take 1 tablet (0.1 mg total) by mouth 2 (two) times daily. 02/03/15  Yes Mary-Margaret Hassell Done, FNP  furosemide (LASIX) 40 MG tablet Take 1 tablet (40 mg total) by mouth every morning. 02/03/15  Yes Mary-Margaret Hassell Done, FNP  glimepiride (AMARYL) 4 MG tablet Take 4 mg by mouth 2 (two) times daily. 01/07/15  Yes Historical Provider, MD  hydrALAZINE (APRESOLINE) 50 MG tablet Take 1 tablet (50 mg total) by mouth every 8 (eight) hours. 01/24/15  Yes Radene Gunning, NP  HYDROcodone-acetaminophen (NORCO/VICODIN) 5-325 MG per tablet Take 1-2 tablets by mouth every 4 (four) hours as needed for moderate pain or severe pain. 01/24/15  Yes Radene Gunning, NP   levothyroxine (SYNTHROID, LEVOTHROID) 75 MCG tablet Take 1 tablet (75 mcg total) by mouth daily before breakfast. 02/03/15  Yes Mary-Margaret Hassell Done, FNP  losartan (COZAAR) 100 MG tablet Take 1 tablet (100 mg total) by mouth every morning. 02/03/15  Yes Mary-Margaret Hassell Done, FNP  metoprolol succinate (TOPROL-XL) 50 MG 24 hr tablet Take 1 tablet (50 mg total) by mouth every morning. 02/03/15  Yes Mary-Margaret Hassell Done, FNP  pantoprazole (PROTONIX) 40 MG tablet Take 1 tablet (40 mg total) by mouth 2 (two) times daily. 02/03/15  Yes Mary-Margaret Hassell Done, FNP  potassium chloride SA (K-DUR,KLOR-CON) 20 MEQ tablet Take 1 tablet (20 mEq total) by mouth 2 (two) times daily. 02/03/15  Yes Mary-Margaret Hassell Done, FNP  ondansetron (ZOFRAN) 4 MG tablet Take 1 tablet (4 mg total) by mouth every 8 (eight)  hours as needed for nausea or vomiting. 02/03/15   Mary-Margaret Hassell Done, FNP   BP 202/85 mmHg  Pulse 64  Temp(Src) 98.3 F (36.8 C) (Oral)  Resp 18  Ht 5\' 2"  (1.575 m)  Wt 153 lb (69.4 kg)  BMI 27.98 kg/m2  SpO2 100% Physical Exam  Constitutional: She appears well-developed.  HENT:  Head: Atraumatic.  Neck: Neck supple.  Cardiovascular: Normal rate and regular rhythm.   Pulmonary/Chest: Effort normal.  Abdominal: Soft.  Musculoskeletal: Normal range of motion.  Neurological: She is alert.  Skin: Skin is warm.    ED Course  Procedures (including critical care time) Labs Review Labs Reviewed  BASIC METABOLIC PANEL - Abnormal; Notable for the following:    Sodium 132 (*)    Glucose, Bld 473 (*)    BUN 29 (*)    Creatinine, Ser 1.59 (*)    Calcium 8.1 (*)    GFR calc non Af Amer 32 (*)    GFR calc Af Amer 37 (*)    All other components within normal limits  CBC - Abnormal; Notable for the following:    Hemoglobin 11.9 (*)    HCT 34.8 (*)    All other components within normal limits  URINALYSIS, ROUTINE W REFLEX MICROSCOPIC (NOT AT [facility]) - Abnormal; Notable for the following:    APPearance HAZY  (*)    Glucose, UA 250 (*)    Hgb urine dipstick TRACE (*)    Leukocytes, UA TRACE (*)    All other components within normal limits  URINE MICROSCOPIC-ADD ON - Abnormal; Notable for the following:    Squamous Epithelial / LPF MANY (*)    Bacteria, UA MANY (*)    All other components within normal limits  CBG MONITORING, ED - Abnormal; Notable for the following:    Glucose-Capillary 482 (*)    All other components within normal limits  CBG MONITORING, ED - Abnormal; Notable for the following:    Glucose-Capillary 409 (*)    All other components within normal limits  CBG MONITORING, ED - Abnormal; Notable for the following:    Glucose-Capillary 323 (*)    All other components within normal limits  URINE CULTURE    Imaging Review No results found. I have personally reviewed and evaluated these images and lab results as part of my medical decision-making.   EKG Interpretation None      MDM   Final diagnoses:  Hyperglycemia     patient with hyperglycemia. Urine culture sent. Sugars improved. Discussed with primary care and they will see tomorrow and adjust medications. Not in DKA.    Davonna Belling, MD 02/26/15 [phone]

## 2015-02-26 NOTE — Discharge Instructions (Signed)
Hyperglycemia °Hyperglycemia occurs when the glucose (sugar) in your blood is too high. Hyperglycemia can happen for many reasons, but it most often happens to people who do not know they have diabetes or are not managing their diabetes properly.  °CAUSES  °Whether you have diabetes or not, there are other causes of hyperglycemia. Hyperglycemia can occur when you have diabetes, but it can also occur in other situations that you might not be as aware of, such as: °Diabetes °· If you have diabetes and are having problems controlling your blood glucose, hyperglycemia could occur because of some of the following reasons: °¨ Not following your meal plan. °¨ Not taking your diabetes medications or not taking it properly. °¨ Exercising less or doing less activity than you normally do. °¨ Being sick. °Pre-diabetes °· This cannot be ignored. Before people develop Type 2 diabetes, they almost always have "pre-diabetes." This is when your blood glucose levels are higher than normal, but not yet high enough to be diagnosed as diabetes. Research has shown that some long-term damage to the body, especially the heart and circulatory system, may already be occurring during pre-diabetes. If you take action to manage your blood glucose when you have pre-diabetes, you may delay or prevent Type 2 diabetes from developing. °Stress °· If you have diabetes, you may be "diet" controlled or on oral medications or insulin to control your diabetes. However, you may find that your blood glucose is higher than usual in the hospital whether you have diabetes or not. This is often referred to as "stress hyperglycemia." Stress can elevate your blood glucose. This happens because of hormones put out by the body during times of stress. If stress has been the cause of your high blood glucose, it can be followed regularly by your caregiver. That way he/she can make sure your hyperglycemia does not continue to get worse or progress to  diabetes. °Steroids °· Steroids are medications that act on the infection fighting system (immune system) to block inflammation or infection. One side effect can be a rise in blood glucose. Most people can produce enough extra insulin to allow for this rise, but for those who cannot, steroids make blood glucose levels go even higher. It is not unusual for steroid treatments to "uncover" diabetes that is developing. It is not always possible to determine if the hyperglycemia will go away after the steroids are stopped. A special blood test called an A1c is sometimes done to determine if your blood glucose was elevated before the steroids were started. °SYMPTOMS °· Thirsty. °· Frequent urination. °· Dry mouth. °· Blurred vision. °· Tired or fatigue. °· Weakness. °· Sleepy. °· Tingling in feet or leg. °DIAGNOSIS  °Diagnosis is made by monitoring blood glucose in one or all of the following ways: °· A1c test. This is a chemical found in your blood. °· Fingerstick blood glucose monitoring. °· Laboratory results. °TREATMENT  °First, knowing the cause of the hyperglycemia is important before the hyperglycemia can be treated. Treatment may include, but is not be limited to: °· Education. °· Change or adjustment in medications. °· Change or adjustment in meal plan. °· Treatment for an illness, infection, etc. °· More frequent blood glucose monitoring. °· Change in exercise plan. °· Decreasing or stopping steroids. °· Lifestyle changes. °HOME CARE INSTRUCTIONS  °· Test your blood glucose as directed. °· Exercise regularly. Your caregiver will give you instructions about exercise. Pre-diabetes or diabetes which comes on with stress is helped by exercising. °· Eat wholesome,   balanced meals. Eat often and at regular, fixed times. Your caregiver or nutritionist will give you a meal plan to guide your sugar intake. °· Being at an ideal weight is important. If needed, losing as little as 10 to 15 pounds may help improve blood  glucose levels. °SEEK MEDICAL CARE IF:  °· You have questions about medicine, activity, or diet. °· You continue to have symptoms (problems such as increased thirst, urination, or weight gain). °SEEK IMMEDIATE MEDICAL CARE IF:  °· You are vomiting or have diarrhea. °· Your breath smells fruity. °· You are breathing faster or slower. °· You are very sleepy or incoherent. °· You have numbness, tingling, or pain in your feet or hands. °· You have chest pain. °· Your symptoms get worse even though you have been following your caregiver's orders. °· If you have any other questions or concerns. °  °This information is not intended to replace advice given to you by your health care provider. Make sure you discuss any questions you have with your health care provider. °  °Document Released: 11/03/2000 Document Revised: 08/02/2011 Document Reviewed: 01/14/2015 °Elsevier Interactive Patient Education ©2016 [employer] ° °

## 2015-02-26 NOTE — ED Notes (Signed)
Pt states that she was taken off of her Amaryl due to hypoglycemia.  States now she has been having hyperglycemia.  Denies any associated complaints.

## 2015-02-27 ENCOUNTER — Encounter: Payer: Self-pay | Admitting: Nurse Practitioner

## 2015-02-27 ENCOUNTER — Ambulatory Visit (INDEPENDENT_AMBULATORY_CARE_PROVIDER_SITE_OTHER): Payer: Medicare Other | Admitting: Nurse Practitioner

## 2015-02-27 VITALS — BP 148/82 | HR 67 | Temp 97.8°F

## 2015-02-27 DIAGNOSIS — Z23 Encounter for immunization: Secondary | ICD-10-CM

## 2015-02-27 DIAGNOSIS — R739 Hyperglycemia, unspecified: Secondary | ICD-10-CM | POA: Diagnosis not present

## 2015-02-27 MED ORDER — GLIMEPIRIDE 4 MG PO TABS: 4.0000 mg | ORAL_TABLET | Freq: Two times a day (BID) | ORAL | Status: DC

## 2015-02-27 MED ORDER — LINAGLIPTIN 5 MG PO TABS: 5.0000 mg | ORAL_TABLET | Freq: Every day | ORAL | Status: DC

## 2015-02-27 NOTE — Progress Notes (Signed)
   Subjective:    Patient ID: [name], female    DOB: 28-Feb-1946, 68 y.o.   MRN: [id number]  HPI Patient in today for hospital follow up- Has been having real low blood sugars in the past and Dr. Truman Hayward in ER told her to stop her amaryl. SHe had stopped it the last time she was here for follow up and blood sugar was running good- Over the last few weeks her blood sugar has been going up- has been over 400- we started her back on amaryl but blood sugar yesterday was over 500. Went  To ER and was given insulin which brought it back down to just over 400. this morning her blood sugar was 223.     Review of Systems  Constitutional: Negative.   HENT: Negative.   Respiratory: Negative.   Cardiovascular: Negative.   Gastrointestinal: Negative.   Genitourinary: Negative.   Neurological: Negative.   Hematological: Negative.   All other systems reviewed and are negative.      Objective:   Physical Exam  Constitutional: She is oriented to person, place, and time. She appears well-developed and well-nourished.  Cardiovascular: Normal rate, regular rhythm and normal heart sounds.   Pulmonary/Chest: Effort normal and breath sounds normal.  Musculoskeletal: Normal range of motion.  Neurological: She is alert and oriented to person, place, and time.  Skin: Skin is warm.  Psychiatric: She has a normal mood and affect. Her behavior is normal. Judgment and thought content normal.   BP 148/82 mmHg  Pulse 67  Temp(Src) 97.8 F (36.6 C) (Oral)  Ht   Wt          Assessment & Plan:   1. Encounter for immunization   2. Hyperglycemia    Added tradjenta 5mg  1 po daily Back on amaryl BID as previously rx Check blood sugar every morning RTO in 1 month and prn  Mary-Margaret Hassell Done, FNP

## 2015-02-28 ENCOUNTER — Other Ambulatory Visit: Payer: Self-pay | Admitting: Nurse Practitioner

## 2015-02-28 DIAGNOSIS — I10 Essential (primary) hypertension: Secondary | ICD-10-CM | POA: Diagnosis not present

## 2015-02-28 DIAGNOSIS — R32 Unspecified urinary incontinence: Secondary | ICD-10-CM | POA: Diagnosis not present

## 2015-02-28 DIAGNOSIS — E119 Type 2 diabetes mellitus without complications: Secondary | ICD-10-CM | POA: Diagnosis not present

## 2015-02-28 DIAGNOSIS — M1990 Unspecified osteoarthritis, unspecified site: Secondary | ICD-10-CM | POA: Diagnosis not present

## 2015-02-28 DIAGNOSIS — K603 Anal fistula: Secondary | ICD-10-CM | POA: Diagnosis not present

## 2015-02-28 DIAGNOSIS — M5136 Other intervertebral disc degeneration, lumbar region: Secondary | ICD-10-CM | POA: Diagnosis not present

## 2015-02-28 MED ORDER — GLUCOSE BLOOD VI STRP: ORAL_STRIP | Status: DC

## 2015-02-28 MED ORDER — GLUCOSE BLOOD VI STRP
ORAL_STRIP | Status: DC
Start: 2015-02-28 — End: 2015-05-21

## 2015-02-28 MED ORDER — ONETOUCH DELICA LANCETS 33G MISC
Status: DC
Start: 2015-02-28 — End: 2015-02-28

## 2015-02-28 MED ORDER — ONETOUCH DELICA LANCETS 33G MISC: Status: DC

## 2015-03-01 ENCOUNTER — Telehealth: Payer: Self-pay | Admitting: Family Medicine

## 2015-03-01 LAB — URINE CULTURE

## 2015-03-01 NOTE — Telephone Encounter (Signed)
  Patient and her sister have called back.   CBG still at 350, after dinner. She states she feels fine and does not want to go get seen tonight. She is tolerating fluids and agrees to drinkl lots of water. She also denies any symptoms of illness including dyspnea, dysuria, and malaise.   She will call back or seek medical help tomorrow if CBG greater than 300 still, also if develops nausea/vomiting or CBG >400 she agrees to go tonight.   Laroy Apple, MD [facility] 03/01/2015, 7:21 PM

## 2015-03-01 NOTE — Telephone Encounter (Signed)
Call from her caretaker about hyperglcemia.   She states she started tradjenta this week and is on amaryl 4mg  BID. Her CBG this am was low 200s and now 320.    I recommended taking amaryl now, plenty of water and re-check in 3 hours. If less than 300 hen she is ok to proceed like usual. If she stays elevated or worsens she may need to be seen at [facility].   Recommended follw up early in the week.   Laroy Apple, MD [facility] 03/01/2015, 3:40 PM

## 2015-03-02 ENCOUNTER — Telehealth: Payer: Self-pay | Admitting: *Deleted

## 2015-03-02 ENCOUNTER — Telehealth (HOSPITAL_COMMUNITY): Payer: Self-pay

## 2015-03-02 NOTE — Telephone Encounter (Signed)
Post ED Visit - Positive Culture Follow-up  Culture report reviewed by antimicrobial stewardship pharmacist:  []  Heide Guile, Pharm.D., BCPS []  Alycia Rossetti, Pharm.D., BCPS []  Clements, [place].D., BCPS, AAHIVP []  Legrand Como, Pharm.D., BCPS, AAHIVP []  [name], Pharm.D. [x]  Cassie Nicole Kindred, [place].D.  Positive urine culture Per Tatyanan Kirichenko- Do not treat urine culture.   Ileene Musa 03/02/2015, 9:57 AM

## 2015-03-02 NOTE — Progress Notes (Signed)
ED Antimicrobial Stewardship Positive Culture Follow Up   [name] is an 69 y.o. female who presented to [facility] on 02/26/2015 with a chief complaint of  Chief Complaint  Patient presents with  . Hyperglycemia    Recent Results (from the past 720 hour(s))  Urine culture     Status: None   Collection Time: 02/26/15  1:02 PM  Result Value Ref Range Status   Specimen Description URINE, CLEAN CATCH  Final   Special Requests NONE  Final   Culture   Final    >=100,000 COLONIES/mL ESCHERICHIA COLI Performed at [facility]    Report Status 03/01/2015 FINAL  Final   Organism ID, Bacteria ESCHERICHIA COLI  Final      Susceptibility   Escherichia coli - MIC*    AMPICILLIN <=2 SENSITIVE Sensitive     CEFAZOLIN <=4 SENSITIVE Sensitive     CEFTRIAXONE <=1 SENSITIVE Sensitive     CIPROFLOXACIN >=4 RESISTANT Resistant     GENTAMICIN <=1 SENSITIVE Sensitive     IMIPENEM <=0.25 SENSITIVE Sensitive     NITROFURANTOIN <=16 SENSITIVE Sensitive     TRIMETH/SULFA >=320 RESISTANT Resistant     AMPICILLIN/SULBACTAM <=2 SENSITIVE Sensitive     PIP/TAZO <=4 SENSITIVE Sensitive     * >=100,000 COLONIES/mL ESCHERICHIA COLI   Many squamous cells on UA.  Culture is a contaminant, and patient does not have any urinary symptoms.   ED Provider: Jeannett Senior, PA-C  Cheryl Chay L. Nicole Kindred, PharmD PGY2 Infectious Diseases Pharmacy Resident Pager: [phone] 03/02/2015 8:38 AM

## 2015-03-02 NOTE — ED Notes (Signed)
(+)  urine culture, contaminant, do not treat per T. Kirichenko

## 2015-03-03 DIAGNOSIS — K603 Anal fistula: Secondary | ICD-10-CM | POA: Diagnosis not present

## 2015-03-03 DIAGNOSIS — M5136 Other intervertebral disc degeneration, lumbar region: Secondary | ICD-10-CM | POA: Diagnosis not present

## 2015-03-03 DIAGNOSIS — E119 Type 2 diabetes mellitus without complications: Secondary | ICD-10-CM | POA: Diagnosis not present

## 2015-03-03 DIAGNOSIS — I10 Essential (primary) hypertension: Secondary | ICD-10-CM | POA: Diagnosis not present

## 2015-03-03 DIAGNOSIS — R32 Unspecified urinary incontinence: Secondary | ICD-10-CM | POA: Diagnosis not present

## 2015-03-03 DIAGNOSIS — M1990 Unspecified osteoarthritis, unspecified site: Secondary | ICD-10-CM | POA: Diagnosis not present

## 2015-03-04 ENCOUNTER — Telehealth: Payer: Self-pay | Admitting: Nurse Practitioner

## 2015-03-04 DIAGNOSIS — K603 Anal fistula: Secondary | ICD-10-CM | POA: Diagnosis not present

## 2015-03-04 DIAGNOSIS — I10 Essential (primary) hypertension: Secondary | ICD-10-CM | POA: Diagnosis not present

## 2015-03-04 DIAGNOSIS — M5136 Other intervertebral disc degeneration, lumbar region: Secondary | ICD-10-CM | POA: Diagnosis not present

## 2015-03-04 DIAGNOSIS — M1990 Unspecified osteoarthritis, unspecified site: Secondary | ICD-10-CM | POA: Diagnosis not present

## 2015-03-04 DIAGNOSIS — R32 Unspecified urinary incontinence: Secondary | ICD-10-CM | POA: Diagnosis not present

## 2015-03-04 DIAGNOSIS — E119 Type 2 diabetes mellitus without complications: Secondary | ICD-10-CM | POA: Diagnosis not present

## 2015-03-05 DIAGNOSIS — M1990 Unspecified osteoarthritis, unspecified site: Secondary | ICD-10-CM | POA: Diagnosis not present

## 2015-03-05 DIAGNOSIS — R32 Unspecified urinary incontinence: Secondary | ICD-10-CM | POA: Diagnosis not present

## 2015-03-05 DIAGNOSIS — M5136 Other intervertebral disc degeneration, lumbar region: Secondary | ICD-10-CM | POA: Diagnosis not present

## 2015-03-05 DIAGNOSIS — E119 Type 2 diabetes mellitus without complications: Secondary | ICD-10-CM | POA: Diagnosis not present

## 2015-03-05 DIAGNOSIS — K603 Anal fistula: Secondary | ICD-10-CM | POA: Diagnosis not present

## 2015-03-05 DIAGNOSIS — I10 Essential (primary) hypertension: Secondary | ICD-10-CM | POA: Diagnosis not present

## 2015-03-06 DIAGNOSIS — I10 Essential (primary) hypertension: Secondary | ICD-10-CM | POA: Diagnosis not present

## 2015-03-06 DIAGNOSIS — M5136 Other intervertebral disc degeneration, lumbar region: Secondary | ICD-10-CM | POA: Diagnosis not present

## 2015-03-06 DIAGNOSIS — M1990 Unspecified osteoarthritis, unspecified site: Secondary | ICD-10-CM | POA: Diagnosis not present

## 2015-03-06 DIAGNOSIS — E119 Type 2 diabetes mellitus without complications: Secondary | ICD-10-CM | POA: Diagnosis not present

## 2015-03-06 DIAGNOSIS — K603 Anal fistula: Secondary | ICD-10-CM | POA: Diagnosis not present

## 2015-03-06 DIAGNOSIS — R32 Unspecified urinary incontinence: Secondary | ICD-10-CM | POA: Diagnosis not present

## 2015-03-07 ENCOUNTER — Other Ambulatory Visit (HOSPITAL_COMMUNITY): Payer: Medicare Other

## 2015-03-07 ENCOUNTER — Ambulatory Visit (HOSPITAL_COMMUNITY): Payer: Medicare Other

## 2015-03-07 ENCOUNTER — Other Ambulatory Visit: Payer: Self-pay | Admitting: Nurse Practitioner

## 2015-03-10 DIAGNOSIS — Z79899 Other long term (current) drug therapy: Secondary | ICD-10-CM | POA: Diagnosis not present

## 2015-03-10 DIAGNOSIS — M47896 Other spondylosis, lumbar region: Secondary | ICD-10-CM | POA: Diagnosis not present

## 2015-03-10 DIAGNOSIS — I10 Essential (primary) hypertension: Secondary | ICD-10-CM | POA: Diagnosis not present

## 2015-03-10 DIAGNOSIS — M4316 Spondylolisthesis, lumbar region: Secondary | ICD-10-CM | POA: Diagnosis not present

## 2015-03-10 DIAGNOSIS — Z888 Allergy status to other drugs, medicaments and biological substances status: Secondary | ICD-10-CM | POA: Diagnosis not present

## 2015-03-10 DIAGNOSIS — Z881 Allergy status to other antibiotic agents status: Secondary | ICD-10-CM | POA: Diagnosis not present

## 2015-03-10 DIAGNOSIS — E039 Hypothyroidism, unspecified: Secondary | ICD-10-CM | POA: Diagnosis not present

## 2015-03-10 DIAGNOSIS — Z8781 Personal history of (healed) traumatic fracture: Secondary | ICD-10-CM | POA: Diagnosis not present

## 2015-03-10 DIAGNOSIS — G8929 Other chronic pain: Secondary | ICD-10-CM | POA: Diagnosis not present

## 2015-03-10 DIAGNOSIS — K219 Gastro-esophageal reflux disease without esophagitis: Secondary | ICD-10-CM | POA: Diagnosis not present

## 2015-03-10 DIAGNOSIS — Z885 Allergy status to narcotic agent status: Secondary | ICD-10-CM | POA: Diagnosis not present

## 2015-03-10 DIAGNOSIS — M5136 Other intervertebral disc degeneration, lumbar region: Secondary | ICD-10-CM | POA: Diagnosis not present

## 2015-03-10 DIAGNOSIS — M545 Low back pain: Secondary | ICD-10-CM | POA: Diagnosis not present

## 2015-03-10 DIAGNOSIS — Z886 Allergy status to analgesic agent status: Secondary | ICD-10-CM | POA: Diagnosis not present

## 2015-03-10 DIAGNOSIS — E785 Hyperlipidemia, unspecified: Secondary | ICD-10-CM | POA: Diagnosis not present

## 2015-03-11 ENCOUNTER — Encounter (HOSPITAL_COMMUNITY)
Admission: RE | Admit: 2015-03-11 | Discharge: 2015-03-11 | Disposition: A | Discharge status: Home / Self Care | Payer: Medicare Other | Source: Ambulatory Visit | Attending: Nephrology | Admitting: Nephrology

## 2015-03-11 ENCOUNTER — Emergency Department (HOSPITAL_COMMUNITY)
Admission: EM | Admit: 2015-03-11 | Discharge: 2015-03-11 | Disposition: A | Discharge status: Home / Self Care | Payer: Medicare Other | Attending: Emergency Medicine | Admitting: Emergency Medicine

## 2015-03-11 ENCOUNTER — Other Ambulatory Visit (HOSPITAL_COMMUNITY): Payer: Medicare Other

## 2015-03-11 ENCOUNTER — Encounter (HOSPITAL_COMMUNITY): Payer: Self-pay | Admitting: *Deleted

## 2015-03-11 ENCOUNTER — Encounter (HOSPITAL_COMMUNITY): Payer: Self-pay

## 2015-03-11 DIAGNOSIS — M1990 Unspecified osteoarthritis, unspecified site: Secondary | ICD-10-CM | POA: Diagnosis not present

## 2015-03-11 DIAGNOSIS — K219 Gastro-esophageal reflux disease without esophagitis: Secondary | ICD-10-CM | POA: Diagnosis not present

## 2015-03-11 DIAGNOSIS — K5900 Constipation, unspecified: Secondary | ICD-10-CM | POA: Diagnosis present

## 2015-03-11 DIAGNOSIS — N183 Chronic kidney disease, stage 3 (moderate): Secondary | ICD-10-CM | POA: Diagnosis not present

## 2015-03-11 DIAGNOSIS — K603 Anal fistula: Secondary | ICD-10-CM | POA: Diagnosis not present

## 2015-03-11 DIAGNOSIS — E11319 Type 2 diabetes mellitus with unspecified diabetic retinopathy without macular edema: Secondary | ICD-10-CM | POA: Insufficient documentation

## 2015-03-11 DIAGNOSIS — I129 Hypertensive chronic kidney disease with stage 1 through stage 4 chronic kidney disease, or unspecified chronic kidney disease: Secondary | ICD-10-CM | POA: Insufficient documentation

## 2015-03-11 DIAGNOSIS — Z86018 Personal history of other benign neoplasm: Secondary | ICD-10-CM | POA: Insufficient documentation

## 2015-03-11 DIAGNOSIS — I2510 Atherosclerotic heart disease of native coronary artery without angina pectoris: Secondary | ICD-10-CM | POA: Diagnosis not present

## 2015-03-11 DIAGNOSIS — E785 Hyperlipidemia, unspecified: Secondary | ICD-10-CM | POA: Diagnosis not present

## 2015-03-11 DIAGNOSIS — E039 Hypothyroidism, unspecified: Secondary | ICD-10-CM | POA: Insufficient documentation

## 2015-03-11 DIAGNOSIS — D638 Anemia in other chronic diseases classified elsewhere: Secondary | ICD-10-CM | POA: Diagnosis not present

## 2015-03-11 DIAGNOSIS — R32 Unspecified urinary incontinence: Secondary | ICD-10-CM | POA: Diagnosis not present

## 2015-03-11 DIAGNOSIS — Z872 Personal history of diseases of the skin and subcutaneous tissue: Secondary | ICD-10-CM | POA: Insufficient documentation

## 2015-03-11 DIAGNOSIS — E119 Type 2 diabetes mellitus without complications: Secondary | ICD-10-CM | POA: Diagnosis not present

## 2015-03-11 DIAGNOSIS — M5136 Other intervertebral disc degeneration, lumbar region: Secondary | ICD-10-CM | POA: Diagnosis not present

## 2015-03-11 DIAGNOSIS — Z79899 Other long term (current) drug therapy: Secondary | ICD-10-CM | POA: Diagnosis not present

## 2015-03-11 DIAGNOSIS — I10 Essential (primary) hypertension: Secondary | ICD-10-CM | POA: Diagnosis not present

## 2015-03-11 HISTORY — DX: Constipation, unspecified: K59.00

## 2015-03-11 LAB — HEMOGLOBIN AND HEMATOCRIT, BLOOD
HCT: 35.5 % — ABNORMAL LOW (ref 36.0–46.0)
Hemoglobin: 12 g/dL (ref 12.0–15.0)

## 2015-03-11 MED ORDER — PEG 3350-KCL-NABCB-NACL-NASULF 236 G PO SOLR: 250.0000 mL | ORAL | Status: DC | PRN

## 2015-03-11 MED ORDER — DARBEPOETIN ALFA 100 MCG/0.5ML IJ SOSY: 100.0000 ug | PREFILLED_SYRINGE | INTRAMUSCULAR | Status: DC

## 2015-03-11 NOTE — ED Notes (Signed)
C/o constipation for past 5 days.  LBm 5 days ago per pt. Tried prune juice and dulcolax with no results

## 2015-03-11 NOTE — Progress Notes (Signed)
Results for [name], [name] (MRN [id number]) as of 03/11/2015 13:20  Ref. Range 03/11/2015 11:49  Hemoglobin Latest Ref Range: 12.0-15.0 g/dL 12.0  HCT Latest Ref Range: 36.0-46.0 % 35.5 (L)

## 2015-03-11 NOTE — Discharge Instructions (Signed)

## 2015-03-11 NOTE — ED Provider Notes (Signed)
CSN: [id number]     Arrival date & time 03/11/15  1224 History  By signing my name below, I, [name], attest that this documentation has been prepared under the direction and in the presence of [name] Rank, MD. Electronically Signed: Terressa [name], ED Scribe. 03/11/2015. 12:44 PM.  Chief Complaint  Patient presents with  . Constipation   The history is provided by the patient. No language interpreter was used.   PCP: [name] Pretty, FNP HPI Comments: [name] is a 69 y.o. female, with PMHx noted below, who presents to the Emergency Department complaining of constipation onset 5 days ago. Pt reports her last BM was five days ago. Pt reports using prune juice, dulcolax and miralax without relief. Pt denies fever, abd pain, vomiting.   Past Medical History  Diagnosis Date  . Hypertension   . Hyperlipidemia   . GERD (gastroesophageal reflux disease)   . GAD (generalized anxiety disorder)   . Hypothyroidism   . Type II diabetes mellitus ([place])   . Right bundle branch block   . LAFB (left anterior fascicular block)   . History of rectal abscess     12-29-2004  bedside I & D  . History of GI bleed     upper 2009  due to esophagitis  &  2002  due to Mallory-Weiss tear  . History of colon polyps     benign  . Diverticulosis of colon   . History of esophagitis   . Diabetic gastroparesis ([place])   . History of gout     in issues for several years  . Sacral decubitus ulcer     since 2014- 01-02-15 remains with wound" gauze dressing changes daily.  [name] History of Mallory-Weiss syndrome     12/ 2002--  resolved  . Coronary artery disease   . Complication of anesthesia     post-op confusion   . CKD (chronic kidney disease), stage III     nephrologist--  dr [name]  . Diabetic retinopathy ([place])     bilateral --  monitored by dr [name]  . Arthritis     knees and hand/fingers. "broke back"-being evaluated for this"weakness left leg"  . Anemia in chronic renal disease      Aranesp injection --  when Hg <11, last injection 12-24-14.  [name] Anxiety   . Constipation    Past Surgical History  Procedure Laterality Date  . Retinopathy surgery Bilateral 1980's?  . Orif femur fracture Left 10/09/2012    Procedure: OPEN REDUCTION INTERNAL FIXATION (ORIF) DISTAL FEMUR FRACTURE;  Surgeon: [name] Box, MD;  Location: [facility];  Service: Orthopedics;  Laterality: Left;  . Compression hip screw Right 05/18/2014    Procedure: COMPRESSION HIP;  Surgeon: [name] Civil, MD;  Location: [facility];  Service: Orthopedics;  Laterality: Right;  . Esophagogastroduodenoscopy  last one 01-09-2011  . Transthoracic echocardiogram  02-18-2011    mild LVH,  ef 55-60%,  grade I diastolic dysfunction/  mild TR/  RV systolic pressure increased consistant with moderate pulmonary hypertension  . Cardiovascular stress test  12-30-2004    normal perfusion study/  no ischemia or infartion/  normal LV wall function and wall motion , ef 66%  . Colonoscopy w/ polypectomy  last one 2008  . Cataract extraction w/ intraocular lens  implant, bilateral  1995  . Incision and drainage of wound N/A 09/30/2014    Procedure: IRRIGATION AND DEBRIDEMENT SACRAL WOUND, EXCISION OF PERIRECTAL TRACT WITH PLACEMENT OF ACCELL;  Surgeon: [name] Leo  Sanger, DO;  Location: [facility];  Service: Clinical cytogeneticist;  Laterality: N/A;  . Evaluation under anesthesia with fistulectomy N/A 01/06/2015    Procedure: EXAM UNDER ANESTHESIA , placement of seton;  Surgeon: [name] Confer, MD;  Location: [facility];  Service: General;  Laterality: N/A;   Family History  Problem Relation Age of Onset  . Colon cancer Father   . Prostate cancer Father   . Diabetes Father   . Coronary artery disease Mother 70  . Heart disease Mother   . Diabetes Mother   . Coronary artery disease Sister 79  . Diabetes Sister   . Heart disease Sister   . Diabetes Maternal Grandmother   . Diabetes Sister   . Diabetes Sister   . Diabetes Sister   .  Diabetes Sister   . Diabetes Sister    Social History  Substance Use Topics  . Smoking status: Never Smoker   . Smokeless tobacco: Never Used  . Alcohol Use: No   OB History    No data available     Review of Systems  Constitutional: Negative for fever.  Gastrointestinal: Positive for constipation. Negative for nausea, vomiting and abdominal pain.  All other systems reviewed and are negative.  Allergies  Aspirin; Ciprofloxacin; Codeine; Micardis; Other; and Rofecoxib  Home Medications   Prior to Admission medications   Medication Sig Start Date End Date Taking? Authorizing Provider  atorvastatin (LIPITOR) 40 MG tablet Take 1 tablet (40 mg total) by mouth every evening. 02/03/15   Mary-Margaret Hassell Done, FNP  calcitRIOL (ROCALTROL) 0.25 MCG capsule TAKE 1 CAPSULE ONE TIME DAILY 03/07/15   Mary-Margaret Hassell Done, FNP  cloNIDine (CATAPRES) 0.1 MG tablet Take 1 tablet (0.1 mg total) by mouth 2 (two) times daily. 02/03/15   Mary-Margaret Hassell Done, FNP  furosemide (LASIX) 40 MG tablet Take 1 tablet (40 mg total) by mouth every morning. 02/03/15   Mary-Margaret Hassell Done, FNP  glimepiride (AMARYL) 4 MG tablet Take 1 tablet (4 mg total) by mouth 2 (two) times daily. 02/27/15   Mary-Margaret Hassell Done, FNP  glucose blood (ONETOUCH VERIO) test strip Test 1 time per day and prn  E11.9 02/28/15   Mary-Margaret Hassell Done, FNP  hydrALAZINE (APRESOLINE) 50 MG tablet Take 1 tablet (50 mg total) by mouth every 8 (eight) hours. 01/24/15   Radene Gunning, NP  HYDROcodone-acetaminophen (NORCO/VICODIN) 5-325 MG per tablet Take 1-2 tablets by mouth every 4 (four) hours as needed for moderate pain or severe pain. 01/24/15   Radene Gunning, NP  levothyroxine (SYNTHROID, LEVOTHROID) 75 MCG tablet Take 1 tablet (75 mcg total) by mouth daily before breakfast. 02/03/15   Mary-Margaret Hassell Done, FNP  linagliptin (TRADJENTA) 5 MG TABS tablet Take 1 tablet (5 mg total) by mouth daily. 02/27/15   Mary-Margaret Hassell Done, FNP  losartan (COZAAR)  100 MG tablet Take 1 tablet (100 mg total) by mouth every morning. 02/03/15   Mary-Margaret Hassell Done, FNP  metoprolol succinate (TOPROL-XL) 50 MG 24 hr tablet Take 1 tablet (50 mg total) by mouth every morning. 02/03/15   Mary-Margaret Hassell Done, FNP  ondansetron (ZOFRAN) 4 MG tablet Take 1 tablet (4 mg total) by mouth every 8 (eight) hours as needed for nausea or vomiting. 02/03/15   Mary-Margaret Hassell Done, FNP  [facility] DELICA LANCETS [id number] MISC Test one time per day and prn 02/28/15   Mary-Margaret Hassell Done, FNP  pantoprazole (PROTONIX) 40 MG tablet Take 1 tablet (40 mg total) by mouth 2 (two) times daily. 02/03/15   Mary-Margaret Hassell Done, FNP  polyethylene glycol (  GOLYTELY) 236 G solution Take 250 mLs by mouth every hour as needed (until you have a bowel movement). 03/11/15   [name] Rank, MD  potassium chloride SA (K-DUR,KLOR-CON) 20 MEQ tablet Take 1 tablet (20 mEq total) by mouth 2 (two) times daily. 02/03/15   Mary-Margaret Hassell Done, FNP   Triage Vitals: BP 148/88 mmHg  Pulse 64  Temp(Src) 97.8 F (36.6 C) (Oral)  Resp 16  Ht 5\' 2"  (1.575 m)  Wt 153 lb (69.4 kg)  BMI 27.98 kg/m2  SpO2 100% Physical Exam  Constitutional: She appears well-developed and well-nourished. No distress.  HENT:  Head: Normocephalic and atraumatic.  Right Ear: External ear normal.  Left Ear: External ear normal.  Eyes: Conjunctivae are normal. Right eye exhibits no discharge. Left eye exhibits no discharge. No scleral icterus.  Neck: Neck supple. No tracheal deviation present.  Cardiovascular: Normal rate, regular rhythm and normal heart sounds.   Pulmonary/Chest: Effort normal. No stridor. No respiratory distress. She has wheezes. She has rales.  Abdominal: Soft. Bowel sounds are normal. She exhibits no distension. There is no tenderness. There is no rebound and no guarding.  Genitourinary:  S/p anal fissure repair, no rectal mass appreciated, no fecal impaction, empty rectal vault,  increased anal sphincter tone   Musculoskeletal: She exhibits edema (trace edema bilateral ankles).  Neurological: She is alert. Cranial nerve deficit: no gross deficits.  Skin: Skin is warm and dry. No rash noted.  Psychiatric: She has a normal mood and affect.  Nursing note and vitals reviewed.   ED Course  Procedures (including critical care time) DIAGNOSTIC STUDIES: Oxygen Saturation is 100% on RA, nl by my interpretation.    COORDINATION OF CARE: 12:43 PM: Discussed treatment plan which includes rectal with pt at bedside; patient verbalizes understanding and agrees with treatment plan.  MDM   Final diagnoses:  Constipation, unspecified constipation type    Pt has complaints of constipation.  No abd pain or tenderness.  No sx to suggest obstruction or infection. She came to the ED because she happened to be in the hospital for an infection.  Pt is requesting an enema but she does not have any significant stool in the rectal vault.  Will rx golytely.  She had this rx in the past for a previous visit to the ED for constipation.  I personally performed the services described in this documentation, which was scribed in my presence.  The recorded information has been reviewed and is accurate.     [name] Rank, MD 03/11/15 [phone]

## 2015-03-12 DIAGNOSIS — K602 Anal fissure, unspecified: Secondary | ICD-10-CM | POA: Diagnosis not present

## 2015-03-12 DIAGNOSIS — I5030 Unspecified diastolic (congestive) heart failure: Secondary | ICD-10-CM | POA: Diagnosis not present

## 2015-03-12 DIAGNOSIS — E1122 Type 2 diabetes mellitus with diabetic chronic kidney disease: Secondary | ICD-10-CM | POA: Diagnosis not present

## 2015-03-12 DIAGNOSIS — E11319 Type 2 diabetes mellitus with unspecified diabetic retinopathy without macular edema: Secondary | ICD-10-CM | POA: Diagnosis not present

## 2015-03-12 DIAGNOSIS — N183 Chronic kidney disease, stage 3 (moderate): Secondary | ICD-10-CM | POA: Diagnosis not present

## 2015-03-12 DIAGNOSIS — I130 Hypertensive heart and chronic kidney disease with heart failure and stage 1 through stage 4 chronic kidney disease, or unspecified chronic kidney disease: Secondary | ICD-10-CM | POA: Diagnosis not present

## 2015-03-14 DIAGNOSIS — I130 Hypertensive heart and chronic kidney disease with heart failure and stage 1 through stage 4 chronic kidney disease, or unspecified chronic kidney disease: Secondary | ICD-10-CM | POA: Diagnosis not present

## 2015-03-14 DIAGNOSIS — K602 Anal fissure, unspecified: Secondary | ICD-10-CM | POA: Diagnosis not present

## 2015-03-14 DIAGNOSIS — N183 Chronic kidney disease, stage 3 (moderate): Secondary | ICD-10-CM | POA: Diagnosis not present

## 2015-03-14 DIAGNOSIS — E11319 Type 2 diabetes mellitus with unspecified diabetic retinopathy without macular edema: Secondary | ICD-10-CM | POA: Diagnosis not present

## 2015-03-14 DIAGNOSIS — I5030 Unspecified diastolic (congestive) heart failure: Secondary | ICD-10-CM | POA: Diagnosis not present

## 2015-03-14 DIAGNOSIS — E1122 Type 2 diabetes mellitus with diabetic chronic kidney disease: Secondary | ICD-10-CM | POA: Diagnosis not present

## 2015-03-17 ENCOUNTER — Encounter (HOSPITAL_BASED_OUTPATIENT_CLINIC_OR_DEPARTMENT_OTHER): Payer: Medicare Other | Attending: Plastic Surgery

## 2015-03-17 ENCOUNTER — Telehealth: Payer: Self-pay | Admitting: Nurse Practitioner

## 2015-03-17 DIAGNOSIS — K603 Anal fistula: Secondary | ICD-10-CM | POA: Diagnosis not present

## 2015-03-17 DIAGNOSIS — S31819D Unspecified open wound of right buttock, subsequent encounter: Secondary | ICD-10-CM | POA: Diagnosis not present

## 2015-03-17 DIAGNOSIS — X58XXXD Exposure to other specified factors, subsequent encounter: Secondary | ICD-10-CM | POA: Insufficient documentation

## 2015-03-17 DIAGNOSIS — E119 Type 2 diabetes mellitus without complications: Secondary | ICD-10-CM | POA: Diagnosis not present

## 2015-03-18 DIAGNOSIS — N183 Chronic kidney disease, stage 3 (moderate): Secondary | ICD-10-CM | POA: Diagnosis not present

## 2015-03-18 DIAGNOSIS — E1122 Type 2 diabetes mellitus with diabetic chronic kidney disease: Secondary | ICD-10-CM | POA: Diagnosis not present

## 2015-03-18 DIAGNOSIS — K602 Anal fissure, unspecified: Secondary | ICD-10-CM | POA: Diagnosis not present

## 2015-03-18 DIAGNOSIS — E11319 Type 2 diabetes mellitus with unspecified diabetic retinopathy without macular edema: Secondary | ICD-10-CM | POA: Diagnosis not present

## 2015-03-18 DIAGNOSIS — I5030 Unspecified diastolic (congestive) heart failure: Secondary | ICD-10-CM | POA: Diagnosis not present

## 2015-03-18 DIAGNOSIS — I130 Hypertensive heart and chronic kidney disease with heart failure and stage 1 through stage 4 chronic kidney disease, or unspecified chronic kidney disease: Secondary | ICD-10-CM | POA: Diagnosis not present

## 2015-03-21 DIAGNOSIS — I130 Hypertensive heart and chronic kidney disease with heart failure and stage 1 through stage 4 chronic kidney disease, or unspecified chronic kidney disease: Secondary | ICD-10-CM | POA: Diagnosis not present

## 2015-03-21 DIAGNOSIS — I5030 Unspecified diastolic (congestive) heart failure: Secondary | ICD-10-CM | POA: Diagnosis not present

## 2015-03-21 DIAGNOSIS — K602 Anal fissure, unspecified: Secondary | ICD-10-CM | POA: Diagnosis not present

## 2015-03-21 DIAGNOSIS — E11319 Type 2 diabetes mellitus with unspecified diabetic retinopathy without macular edema: Secondary | ICD-10-CM | POA: Diagnosis not present

## 2015-03-21 DIAGNOSIS — E1122 Type 2 diabetes mellitus with diabetic chronic kidney disease: Secondary | ICD-10-CM | POA: Diagnosis not present

## 2015-03-21 DIAGNOSIS — N183 Chronic kidney disease, stage 3 (moderate): Secondary | ICD-10-CM | POA: Diagnosis not present

## 2015-03-24 ENCOUNTER — Encounter: Payer: Self-pay | Admitting: Nurse Practitioner

## 2015-03-24 ENCOUNTER — Ambulatory Visit (INDEPENDENT_AMBULATORY_CARE_PROVIDER_SITE_OTHER): Payer: Medicare Other | Admitting: Nurse Practitioner

## 2015-03-24 VITALS — BP 157/84 | HR 64 | Temp 97.3°F

## 2015-03-24 DIAGNOSIS — E1122 Type 2 diabetes mellitus with diabetic chronic kidney disease: Secondary | ICD-10-CM

## 2015-03-24 DIAGNOSIS — N181 Chronic kidney disease, stage 1: Secondary | ICD-10-CM

## 2015-03-24 NOTE — Progress Notes (Signed)
   Subjective:    Patient ID: [name], female    DOB: Mar 22, 1946, 69 y.o.   MRN: [id number]  HPI: States her CBGs have been running high at home and she has been keeping a record of them. Denies any associated symptoms with the elevated readings, range from 300s-140s. Recently had a steroid shot in her back for pain on Oct 17th. Reports taking her medication as prescribed at home.   Diabetes She presents for her follow-up diabetic visit. She has type 2 diabetes mellitus. There are no hypoglycemic associated symptoms. Pertinent negatives for diabetes include no blurred vision, no chest pain, no polydipsia, no polyphagia, no weakness and no weight loss. There are no hypoglycemic complications. Symptoms are worsening. Risk factors for coronary artery disease include diabetes mellitus, post-menopausal, sedentary lifestyle and obesity.      Review of Systems  Constitutional: Negative for weight loss.  Eyes: Negative for blurred vision.  Cardiovascular: Negative for chest pain.  Endocrine: Negative for polydipsia and polyphagia.  Neurological: Negative for weakness.       Objective:   Physical Exam  Constitutional: She is oriented to person, place, and time. She appears well-developed and well-nourished.  HENT:  Nose: Nose normal.  Mouth/Throat: Oropharynx is clear and moist.  Eyes: EOM are normal.  Neck: Trachea normal, normal range of motion and full passive range of motion without pain. Neck supple. No JVD present. Carotid bruit is not present. No thyromegaly present.  Cardiovascular: Normal rate, regular rhythm, normal heart sounds and intact distal pulses.  Exam reveals no gallop and no friction rub.   No murmur heard. Pulmonary/Chest: Effort normal and breath sounds normal.  Abdominal: Soft. Bowel sounds are normal. She exhibits no distension and no mass. There is no tenderness.  Musculoskeletal: Normal range of motion.  Lymphadenopathy:    She has no cervical adenopathy.    Neurological: She is alert and oriented to person, place, and time. She has normal reflexes.  Skin: Skin is warm and dry.  Psychiatric: She has a normal mood and affect. Her behavior is normal. Judgment and thought content normal.    BP 157/84 mmHg  Pulse 64  Temp(Src) 97.3 F (36.3 C) (Oral)  Ht   Wt       Assessment & Plan:  Type 2 diabetes mellitus with stage 1 chronic kidney disease, without long-term current use of insulin ([place]) -Will keep medications the same unitl labs at next visit -Continue with current medications and checking CBGs -Will check A1C at next visit -Follow up sooner if needed  [place], FNP

## 2015-03-24 NOTE — Patient Instructions (Signed)

## 2015-03-25 ENCOUNTER — Encounter (HOSPITAL_COMMUNITY)
Admission: RE | Admit: 2015-03-25 | Discharge: 2015-03-25 | Disposition: A | Discharge status: Home / Self Care | Payer: Medicare Other | Source: Ambulatory Visit | Attending: Nephrology | Admitting: Nephrology

## 2015-03-25 DIAGNOSIS — E1122 Type 2 diabetes mellitus with diabetic chronic kidney disease: Secondary | ICD-10-CM | POA: Diagnosis not present

## 2015-03-25 DIAGNOSIS — N183 Chronic kidney disease, stage 3 (moderate): Secondary | ICD-10-CM | POA: Insufficient documentation

## 2015-03-25 DIAGNOSIS — I130 Hypertensive heart and chronic kidney disease with heart failure and stage 1 through stage 4 chronic kidney disease, or unspecified chronic kidney disease: Secondary | ICD-10-CM | POA: Diagnosis not present

## 2015-03-25 DIAGNOSIS — D638 Anemia in other chronic diseases classified elsewhere: Secondary | ICD-10-CM | POA: Diagnosis not present

## 2015-03-25 DIAGNOSIS — E11319 Type 2 diabetes mellitus with unspecified diabetic retinopathy without macular edema: Secondary | ICD-10-CM | POA: Diagnosis not present

## 2015-03-25 DIAGNOSIS — I5030 Unspecified diastolic (congestive) heart failure: Secondary | ICD-10-CM | POA: Diagnosis not present

## 2015-03-25 DIAGNOSIS — K602 Anal fissure, unspecified: Secondary | ICD-10-CM | POA: Diagnosis not present

## 2015-03-25 LAB — HEMOGLOBIN AND HEMATOCRIT, BLOOD
HEMATOCRIT: 35.9 % — AB (ref 36.0–46.0)
HEMOGLOBIN: 11.8 g/dL — AB (ref 12.0–15.0)

## 2015-03-25 MED ORDER — DARBEPOETIN ALFA 100 MCG/0.5ML IJ SOSY
100.0000 ug | PREFILLED_SYRINGE | Freq: Once | INTRAMUSCULAR | Status: AC
  Administered 2015-03-25: 100 ug via SUBCUTANEOUS

## 2015-03-25 MED ORDER — DARBEPOETIN ALFA 100 MCG/0.5ML IJ SOSY
PREFILLED_SYRINGE | INTRAMUSCULAR | Status: AC
  Filled 2015-03-25: qty 0.5

## 2015-03-26 DIAGNOSIS — N183 Chronic kidney disease, stage 3 (moderate): Secondary | ICD-10-CM | POA: Diagnosis not present

## 2015-03-26 DIAGNOSIS — I5030 Unspecified diastolic (congestive) heart failure: Secondary | ICD-10-CM | POA: Diagnosis not present

## 2015-03-26 DIAGNOSIS — E11319 Type 2 diabetes mellitus with unspecified diabetic retinopathy without macular edema: Secondary | ICD-10-CM | POA: Diagnosis not present

## 2015-03-26 DIAGNOSIS — K602 Anal fissure, unspecified: Secondary | ICD-10-CM | POA: Diagnosis not present

## 2015-03-26 DIAGNOSIS — I130 Hypertensive heart and chronic kidney disease with heart failure and stage 1 through stage 4 chronic kidney disease, or unspecified chronic kidney disease: Secondary | ICD-10-CM | POA: Diagnosis not present

## 2015-03-26 DIAGNOSIS — E1122 Type 2 diabetes mellitus with diabetic chronic kidney disease: Secondary | ICD-10-CM | POA: Diagnosis not present

## 2015-03-28 DIAGNOSIS — E11319 Type 2 diabetes mellitus with unspecified diabetic retinopathy without macular edema: Secondary | ICD-10-CM | POA: Diagnosis not present

## 2015-03-28 DIAGNOSIS — I130 Hypertensive heart and chronic kidney disease with heart failure and stage 1 through stage 4 chronic kidney disease, or unspecified chronic kidney disease: Secondary | ICD-10-CM | POA: Diagnosis not present

## 2015-03-28 DIAGNOSIS — N183 Chronic kidney disease, stage 3 (moderate): Secondary | ICD-10-CM | POA: Diagnosis not present

## 2015-03-28 DIAGNOSIS — K602 Anal fissure, unspecified: Secondary | ICD-10-CM | POA: Diagnosis not present

## 2015-03-28 DIAGNOSIS — E1122 Type 2 diabetes mellitus with diabetic chronic kidney disease: Secondary | ICD-10-CM | POA: Diagnosis not present

## 2015-03-28 DIAGNOSIS — I5030 Unspecified diastolic (congestive) heart failure: Secondary | ICD-10-CM | POA: Diagnosis not present

## 2015-03-31 ENCOUNTER — Other Ambulatory Visit: Payer: Self-pay | Admitting: Plastic Surgery

## 2015-03-31 DIAGNOSIS — N183 Chronic kidney disease, stage 3 (moderate): Secondary | ICD-10-CM | POA: Diagnosis not present

## 2015-03-31 DIAGNOSIS — I5030 Unspecified diastolic (congestive) heart failure: Secondary | ICD-10-CM | POA: Diagnosis not present

## 2015-03-31 DIAGNOSIS — E11319 Type 2 diabetes mellitus with unspecified diabetic retinopathy without macular edema: Secondary | ICD-10-CM | POA: Diagnosis not present

## 2015-03-31 DIAGNOSIS — I130 Hypertensive heart and chronic kidney disease with heart failure and stage 1 through stage 4 chronic kidney disease, or unspecified chronic kidney disease: Secondary | ICD-10-CM | POA: Diagnosis not present

## 2015-03-31 DIAGNOSIS — L89300 Pressure ulcer of unspecified buttock, unstageable: Secondary | ICD-10-CM

## 2015-03-31 DIAGNOSIS — E1122 Type 2 diabetes mellitus with diabetic chronic kidney disease: Secondary | ICD-10-CM | POA: Diagnosis not present

## 2015-03-31 DIAGNOSIS — K602 Anal fissure, unspecified: Secondary | ICD-10-CM | POA: Diagnosis not present

## 2015-04-02 ENCOUNTER — Encounter (HOSPITAL_COMMUNITY)
Admission: RE | Admit: 2015-04-02 | Discharge: 2015-04-02 | Disposition: A | Discharge status: Home / Self Care | Payer: Medicare Other | Source: Ambulatory Visit | Attending: Plastic Surgery | Admitting: Plastic Surgery

## 2015-04-02 ENCOUNTER — Encounter (HOSPITAL_COMMUNITY): Payer: Self-pay

## 2015-04-02 VITALS — BP 140/63 | HR 63 | Temp 98.3°F | Resp 18 | Ht 62.0 in | Wt 160.0 lb

## 2015-04-02 DIAGNOSIS — Z01818 Encounter for other preprocedural examination: Secondary | ICD-10-CM | POA: Insufficient documentation

## 2015-04-02 DIAGNOSIS — L89300 Pressure ulcer of unspecified buttock, unstageable: Secondary | ICD-10-CM

## 2015-04-02 DIAGNOSIS — E119 Type 2 diabetes mellitus without complications: Secondary | ICD-10-CM | POA: Diagnosis not present

## 2015-04-02 LAB — CBC
HEMATOCRIT: 37.6 % (ref 36.0–46.0)
HEMOGLOBIN: 12.3 g/dL (ref 12.0–15.0)
MCH: 30.1 pg (ref 26.0–34.0)
MCHC: 32.7 g/dL (ref 30.0–36.0)
MCV: 91.9 fL (ref 78.0–100.0)
Platelets: 192 10*3/uL (ref 150–400)
RBC: 4.09 MIL/uL (ref 3.87–5.11)
RDW: 14.2 % (ref 11.5–15.5)
WBC: 10 10*3/uL (ref 4.0–10.5)

## 2015-04-02 LAB — BASIC METABOLIC PANEL
ANION GAP: 9 (ref 5–15)
BUN: 32 mg/dL — ABNORMAL HIGH (ref 6–20)
CHLORIDE: 105 mmol/L (ref 101–111)
CO2: 21 mmol/L — AB (ref 22–32)
Calcium: 9.2 mg/dL (ref 8.9–10.3)
Creatinine, Ser: 1.85 mg/dL — ABNORMAL HIGH (ref 0.44–1.00)
GFR calc non Af Amer: 27 mL/min — ABNORMAL LOW (ref >60)
GFR, EST AFRICAN AMERICAN: 31 mL/min — AB (ref >60)
Glucose, Bld: 342 mg/dL — ABNORMAL HIGH (ref 65–99)
POTASSIUM: 5.3 mmol/L — AB (ref 3.5–5.1)
Sodium: 135 mmol/L (ref 135–145)

## 2015-04-02 LAB — GLUCOSE, CAPILLARY: Glucose-Capillary: 280 mg/dL — ABNORMAL HIGH (ref 65–99)

## 2015-04-02 NOTE — Pre-Procedure Instructions (Addendum)
[name]  04/02/2015      [facility] - [address], [place] - [address] [place] [address] Phone: [phone] Fax: [phone]  [facility] [place], [address] [place] [place] [address] Phone: [phone] Fax: [phone]  [place], [place] - [address] [place] [address] Phone: [phone] Fax: [phone]    Your procedure is scheduled on Monday, April 07, 2015  Report to [facility] Admitting at 1:30 P.M.  Call this number if you have problems the morning of surgery:  [phone]   Remember:  Do not eat food or drink liquids after midnight Sunday, April 06, 2015  Take these medicines the morning of surgery with A SIP OF WATER : cloNIDine (CATAPRES), hydrALAZINE (APRESOLINE),  levothyroxine (SYNTHROID, LEVOTHROID),  metoprolol succinate (TOPROL-XL),  pantoprazole (PROTONIX),  if needed: HYDROcodone-acetaminophen (NORCO/VICODIN) for pain,  ondansetron (ZOFRAN) for nausea or vomiting.  DO NOT take dinner dose of  glimepiride (AMARYL) the day before surgery  DO NOT take oral diabetes medications ( pills) the morning of surgery such as linagliptin (TRADJENTA) and glimepiride (AMARYL)  Stop taking Aspirin, vitamins, fish oil, and herbal medications. Do not take any NSAIDs ie: Ibuprofen, Advil, Naproxen or any medication containing Aspirin; stop now. How to Manage Your Diabetes Before Surgery Why is it important to control my blood sugar before and after surgery?   Improving blood sugar levels before and after surgery helps healing and can limit problems.  A way of improving blood sugar control is eating a healthy diet by:  - Eating less sugar and carbohydrates  - Increasing activity/exercise  - Talk with your doctor about reaching your blood sugar goals  High blood sugars (greater than 180 mg/dL) can raise your risk of  infections and slow down your recovery so you will need to focus on controlling your diabetes during the weeks before surgery.  Make sure that the doctor who takes care of your diabetes knows about your planned surgery including the date and location.  How do I manage my blood sugars before surgery?   Check your blood sugar at least 4 times a day, 2 days before surgery to make sure that they are not too high or low.   Check your blood sugar the morning of your surgery when you wake up and every 2  hours until you get to the Short-Stay unit.  If your blood sugar is less than 70 mg/dL, you will need to treat for low blood sugar by:  Treat a low blood sugar (less than 70 mg/dL) with 1/2 cup of clear juice (cranberry or apple), 4 glucose tablets, OR glucose gel.  Recheck blood sugar in 15 minutes after treatment (to make sure it is greater than 70 mg/dL).  If blood sugar is not greater than 70 mg/dL on re-check, call [phone] for further instructions.   Report your blood sugar to the Short-Stay nurse when you get to Short-Stay.  References:  [facility], 2007 "How to Manage your Diabetes Before and After Surgery".  Do not wear jewelry, make-up or nail polish.  Do not wear lotions, powders, or perfumes.  You may not wear deodorant.  Do not shave 48 hours prior to surgery.   Do not bring valuables to the hospital.  [facility] is not responsible for any belongings or valuables.  Contacts, dentures or  bridgework may not be worn into surgery.  Leave your suitcase in the car.  After surgery it may be brought to your room.  For patients admitted to the hospital, discharge time will be determined by your treatment team.  Patients discharged the day of surgery will not be allowed to drive home.   Name and phone number of your driver:   Special instructions: Shower the night before surgery and the morning of surgery with CHG.  Please read over the following fact  sheets that you were given. Pain Booklet, Coughing and Deep Breathing and Surgical Site Infection Prevention

## 2015-04-02 NOTE — Progress Notes (Signed)
Pt denies SOB and chest pain but is under the care of Dr. Percival Spanish, cardiology. Pt denies having a cardiac cath. Pt stated that she is scheduled to see an endocrinologist ( forgot MD's name) in the near future ( after her procedure) regarding her unstable blood glucose.Pt chart forwarded to [place], [place], anesthesia, regarding pt cardiac history and abnormal labs ( creatinine and glucose). Pt PAT appointment was at 8:00A.M. and she stated that she had eaten breakfast at around 6:00A.M. ( glucose 342).

## 2015-04-03 ENCOUNTER — Ambulatory Visit: Payer: Medicare Other | Admitting: "Endocrinology

## 2015-04-03 DIAGNOSIS — K602 Anal fissure, unspecified: Secondary | ICD-10-CM | POA: Diagnosis not present

## 2015-04-03 DIAGNOSIS — E1122 Type 2 diabetes mellitus with diabetic chronic kidney disease: Secondary | ICD-10-CM | POA: Diagnosis not present

## 2015-04-03 DIAGNOSIS — N183 Chronic kidney disease, stage 3 (moderate): Secondary | ICD-10-CM | POA: Diagnosis not present

## 2015-04-03 DIAGNOSIS — M5136 Other intervertebral disc degeneration, lumbar region: Secondary | ICD-10-CM | POA: Diagnosis not present

## 2015-04-03 DIAGNOSIS — E11319 Type 2 diabetes mellitus with unspecified diabetic retinopathy without macular edema: Secondary | ICD-10-CM | POA: Diagnosis not present

## 2015-04-03 DIAGNOSIS — M4316 Spondylolisthesis, lumbar region: Secondary | ICD-10-CM | POA: Diagnosis not present

## 2015-04-03 DIAGNOSIS — M545 Low back pain: Secondary | ICD-10-CM | POA: Diagnosis not present

## 2015-04-03 DIAGNOSIS — I130 Hypertensive heart and chronic kidney disease with heart failure and stage 1 through stage 4 chronic kidney disease, or unspecified chronic kidney disease: Secondary | ICD-10-CM | POA: Diagnosis not present

## 2015-04-03 DIAGNOSIS — I5030 Unspecified diastolic (congestive) heart failure: Secondary | ICD-10-CM | POA: Diagnosis not present

## 2015-04-03 DIAGNOSIS — M47896 Other spondylosis, lumbar region: Secondary | ICD-10-CM | POA: Diagnosis not present

## 2015-04-03 LAB — HEMOGLOBIN A1C
Hgb A1c MFr Bld: 10 % — ABNORMAL HIGH (ref 4.8–5.6)
MEAN PLASMA GLUCOSE: 240 mg/dL

## 2015-04-03 NOTE — Progress Notes (Signed)
Anesthesia Chart Review:  Pt is 69 year old female scheduled for irrigation and debridement of ischial ulcer, acell placement on 04/07/2015 with Dr. Marla Roe.   PMH includes:  CAD (cannot find documentation to support this dx), HTN, hyperlipidemia, DM, RBBB, LAFB, hypothyroidism, CKD (stage 3), anemia, GERD. Never smoker. BMI 29. S/p open treatment internal fixation R hip fracture 05/18/14. S/p ORIF L distal femur fracture 10/06/12.   Medications include: lipitor, clonidine, lasix, glimepiride, hydralazine, levothyroxine, linagliptin, losartan, metoprolol, protonix, potassium.   Preoperative labs reviewed.  Glucose 342, hgbA1c 10. Last hgbA1c on 01/24/15 was 6.9. Cr 1.85, BUN 32.  Cr over past year has ranged 1.11 to 1.8 with most results around 1.5.   1 view CXR 05/21/14: No acute cardiopulmonary disease  EKG 05/17/14: Sinus rhythm. RBBB and LAFB. Probable LVH. Baseline wander in lead(s) V6  Echo [id number]:  - Systolic function was normal. EF 55-60%. Wall motion was normal. Grade 1 diastolic dysfunction.  - RV systolic pressure was increased consistent with moderate pulmonary hypertension  Reported poorly controlled DM and elevated renal function to [facility] in Dr. Eusebio Friendly office.   Willeen Cass, FNP-BC [facility]/Anesthesiology Phone: [phone] 04/03/2015 3:40 PM

## 2015-04-07 ENCOUNTER — Ambulatory Visit (HOSPITAL_COMMUNITY)
Admission: RE | Admit: 2015-04-07 | Discharge: 2015-04-07 | Disposition: A | Discharge status: Home / Self Care | Payer: Medicare Other | Source: Ambulatory Visit | Attending: Plastic Surgery | Admitting: Plastic Surgery

## 2015-04-07 ENCOUNTER — Ambulatory Visit (HOSPITAL_COMMUNITY): Payer: Medicare Other | Admitting: Anesthesiology

## 2015-04-07 ENCOUNTER — Ambulatory Visit (HOSPITAL_COMMUNITY): Payer: Medicare Other | Admitting: Emergency Medicine

## 2015-04-07 ENCOUNTER — Encounter (HOSPITAL_COMMUNITY)
Admission: RE | Disposition: A | Discharge status: Home / Self Care | Payer: Self-pay | Source: Ambulatory Visit | Attending: Plastic Surgery

## 2015-04-07 ENCOUNTER — Encounter (HOSPITAL_COMMUNITY): Payer: Self-pay | Admitting: Plastic Surgery

## 2015-04-07 DIAGNOSIS — D638 Anemia in other chronic diseases classified elsewhere: Secondary | ICD-10-CM | POA: Insufficient documentation

## 2015-04-07 DIAGNOSIS — I2510 Atherosclerotic heart disease of native coronary artery without angina pectoris: Secondary | ICD-10-CM | POA: Diagnosis not present

## 2015-04-07 DIAGNOSIS — E1143 Type 2 diabetes mellitus with diabetic autonomic (poly)neuropathy: Secondary | ICD-10-CM | POA: Diagnosis not present

## 2015-04-07 DIAGNOSIS — E039 Hypothyroidism, unspecified: Secondary | ICD-10-CM | POA: Insufficient documentation

## 2015-04-07 DIAGNOSIS — I129 Hypertensive chronic kidney disease with stage 1 through stage 4 chronic kidney disease, or unspecified chronic kidney disease: Secondary | ICD-10-CM | POA: Insufficient documentation

## 2015-04-07 DIAGNOSIS — E11319 Type 2 diabetes mellitus with unspecified diabetic retinopathy without macular edema: Secondary | ICD-10-CM | POA: Diagnosis not present

## 2015-04-07 DIAGNOSIS — Z79899 Other long term (current) drug therapy: Secondary | ICD-10-CM | POA: Insufficient documentation

## 2015-04-07 DIAGNOSIS — K219 Gastro-esophageal reflux disease without esophagitis: Secondary | ICD-10-CM | POA: Diagnosis not present

## 2015-04-07 DIAGNOSIS — L89159 Pressure ulcer of sacral region, unspecified stage: Secondary | ICD-10-CM | POA: Diagnosis not present

## 2015-04-07 DIAGNOSIS — Z79891 Long term (current) use of opiate analgesic: Secondary | ICD-10-CM | POA: Insufficient documentation

## 2015-04-07 DIAGNOSIS — L89152 Pressure ulcer of sacral region, stage 2: Secondary | ICD-10-CM | POA: Diagnosis not present

## 2015-04-07 DIAGNOSIS — E785 Hyperlipidemia, unspecified: Secondary | ICD-10-CM | POA: Diagnosis not present

## 2015-04-07 DIAGNOSIS — F411 Generalized anxiety disorder: Secondary | ICD-10-CM | POA: Insufficient documentation

## 2015-04-07 DIAGNOSIS — E1122 Type 2 diabetes mellitus with diabetic chronic kidney disease: Secondary | ICD-10-CM | POA: Insufficient documentation

## 2015-04-07 DIAGNOSIS — L89300 Pressure ulcer of unspecified buttock, unstageable: Secondary | ICD-10-CM

## 2015-04-07 DIAGNOSIS — K3184 Gastroparesis: Secondary | ICD-10-CM | POA: Diagnosis not present

## 2015-04-07 DIAGNOSIS — Z7984 Long term (current) use of oral hypoglycemic drugs: Secondary | ICD-10-CM | POA: Diagnosis not present

## 2015-04-07 DIAGNOSIS — N183 Chronic kidney disease, stage 3 (moderate): Secondary | ICD-10-CM | POA: Insufficient documentation

## 2015-04-07 HISTORY — PX: APPLICATION OF A-CELL OF EXTREMITY: SHX6303

## 2015-04-07 HISTORY — PX: I&D EXTREMITY: SHX5045

## 2015-04-07 LAB — GLUCOSE, CAPILLARY
GLUCOSE-CAPILLARY: 218 mg/dL — AB (ref 65–99)
Glucose-Capillary: 213 mg/dL — ABNORMAL HIGH (ref 65–99)
Glucose-Capillary: 188 mg/dL — ABNORMAL HIGH (ref 65–99)

## 2015-04-07 SURGERY — IRRIGATION AND DEBRIDEMENT EXTREMITY
Anesthesia: General | Site: Buttocks

## 2015-04-07 MED ORDER — PROPOFOL 10 MG/ML IV BOLUS
INTRAVENOUS | Status: AC
  Filled 2015-04-07: qty 20

## 2015-04-07 MED ORDER — SODIUM CHLORIDE 0.9 % IR SOLN
Status: DC | PRN
  Administered 2015-04-07: 500 mL

## 2015-04-07 MED ORDER — HYDRALAZINE HCL 20 MG/ML IJ SOLN
5.0000 mg | INTRAMUSCULAR | Status: DC | PRN
  Administered 2015-04-07: 5 mg via INTRAVENOUS

## 2015-04-07 MED ORDER — ONDANSETRON HCL 4 MG/2ML IJ SOLN
INTRAMUSCULAR | Status: AC
  Filled 2015-04-07: qty 2

## 2015-04-07 MED ORDER — LIDOCAINE HCL (CARDIAC) 20 MG/ML IV SOLN
INTRAVENOUS | Status: DC | PRN
  Administered 2015-04-07: 60 mg via INTRAVENOUS

## 2015-04-07 MED ORDER — FENTANYL CITRATE (PF) 100 MCG/2ML IJ SOLN
INTRAMUSCULAR | Status: DC | PRN
Start: 2015-04-07 — End: 2015-04-07
  Administered 2015-04-07 (×2): 50 ug via INTRAVENOUS

## 2015-04-07 MED ORDER — SODIUM CHLORIDE 0.9 % IV SOLN
INTRAVENOUS | Status: DC
  Administered 2015-04-07 (×2): via INTRAVENOUS

## 2015-04-07 MED ORDER — FENTANYL CITRATE (PF) 250 MCG/5ML IJ SOLN
INTRAMUSCULAR | Status: AC
  Filled 2015-04-07: qty 5

## 2015-04-07 MED ORDER — 0.9 % SODIUM CHLORIDE (POUR BTL) OPTIME
TOPICAL | Status: DC | PRN
  Administered 2015-04-07: 1000 mL

## 2015-04-07 MED ORDER — CEFAZOLIN SODIUM-DEXTROSE 2-3 GM-% IV SOLR
2.0000 g | INTRAVENOUS | Status: AC
  Administered 2015-04-07: 2 g via INTRAVENOUS
  Filled 2015-04-07: qty 50

## 2015-04-07 MED ORDER — EPHEDRINE SULFATE 50 MG/ML IJ SOLN
INTRAMUSCULAR | Status: DC | PRN
  Administered 2015-04-07: 10 mg via INTRAVENOUS

## 2015-04-07 MED ORDER — MIDAZOLAM HCL 2 MG/2ML IJ SOLN
INTRAMUSCULAR | Status: AC
  Filled 2015-04-07: qty 4

## 2015-04-07 MED ORDER — BUPIVACAINE-EPINEPHRINE (PF) 0.5% -1:200000 IJ SOLN
INTRAMUSCULAR | Status: AC
  Filled 2015-04-07: qty 30

## 2015-04-07 MED ORDER — MIDAZOLAM HCL 5 MG/5ML IJ SOLN
INTRAMUSCULAR | Status: DC | PRN
  Administered 2015-04-07: 1 mg via INTRAVENOUS

## 2015-04-07 MED ORDER — HYDRALAZINE HCL 20 MG/ML IJ SOLN
INTRAMUSCULAR | Status: AC
  Filled 2015-04-07: qty 1

## 2015-04-07 MED ORDER — PROPOFOL 10 MG/ML IV BOLUS
INTRAVENOUS | Status: DC | PRN
  Administered 2015-04-07: 150 mg via INTRAVENOUS

## 2015-04-07 MED ORDER — ONDANSETRON HCL 4 MG/2ML IJ SOLN
INTRAMUSCULAR | Status: DC | PRN
  Administered 2015-04-07: 4 mg via INTRAVENOUS

## 2015-04-07 MED ORDER — HYDROCODONE-ACETAMINOPHEN 5-325 MG PO TABS: 1.0000 | ORAL_TABLET | Freq: Four times a day (QID) | ORAL | Status: DC | PRN

## 2015-04-07 MED ORDER — SUCCINYLCHOLINE CHLORIDE 20 MG/ML IJ SOLN
INTRAMUSCULAR | Status: DC | PRN
  Administered 2015-04-07: 120 mg via INTRAVENOUS

## 2015-04-07 MED ORDER — BUPIVACAINE-EPINEPHRINE 0.5% -1:200000 IJ SOLN
INTRAMUSCULAR | Status: DC | PRN
  Administered 2015-04-07: 30 mL

## 2015-04-07 SURGICAL SUPPLY — 47 items
BANDAGE ELASTIC 4 VELCRO ST LF (GAUZE/BANDAGES/DRESSINGS) IMPLANT
BLADE SURG ROTATE 9660 (MISCELLANEOUS) IMPLANT
BNDG GAUZE ELAST 4 BULKY (GAUZE/BANDAGES/DRESSINGS) IMPLANT
CANISTER SUCTION 2500CC (MISCELLANEOUS) ×3 IMPLANT
CHLORAPREP W/TINT 26ML (MISCELLANEOUS) IMPLANT
COTTONBALL LRG STERILE PKG (GAUZE/BANDAGES/DRESSINGS) ×3 IMPLANT
COVER SURGICAL LIGHT HANDLE (MISCELLANEOUS) ×3 IMPLANT
DRAPE EXTREMITY T 121X128X90 (DRAPE) IMPLANT
DRAPE ORTHO SPLIT 77X108 STRL (DRAPES) ×4
DRAPE SURG ORHT 6 SPLT 77X108 (DRAPES) ×2 IMPLANT
DRSG ADAPTIC 3X8 NADH LF (GAUZE/BANDAGES/DRESSINGS) IMPLANT
DRSG EMULSION OIL 3X3 NADH (GAUZE/BANDAGES/DRESSINGS) ×3 IMPLANT
DRSG PAD ABDOMINAL 8X10 ST (GAUZE/BANDAGES/DRESSINGS) ×3 IMPLANT
DRSG VAC ATS LRG SENSATRAC (GAUZE/BANDAGES/DRESSINGS) IMPLANT
DRSG VAC ATS MED SENSATRAC (GAUZE/BANDAGES/DRESSINGS) IMPLANT
DRSG VAC ATS SM SENSATRAC (GAUZE/BANDAGES/DRESSINGS) IMPLANT
ELECT CAUTERY BLADE 6.4 (BLADE) ×3 IMPLANT
ELECT REM PT RETURN 9FT ADLT (ELECTROSURGICAL) ×3
ELECTRODE REM PT RTRN 9FT ADLT (ELECTROSURGICAL) ×1 IMPLANT
GAUZE SPONGE 4X4 12PLY STRL (GAUZE/BANDAGES/DRESSINGS) IMPLANT
GLOVE BIO SURGEON STRL SZ 6.5 (GLOVE) ×4 IMPLANT
GLOVE BIO SURGEONS STRL SZ 6.5 (GLOVE) ×2
GOWN STRL REUS W/ TWL LRG LVL3 (GOWN DISPOSABLE) ×3 IMPLANT
GOWN STRL REUS W/TWL LRG LVL3 (GOWN DISPOSABLE) ×6
HANDPIECE INTERPULSE COAX TIP (DISPOSABLE)
KIT BASIN OR (CUSTOM PROCEDURE TRAY) ×3 IMPLANT
KIT ROOM TURNOVER OR (KITS) ×3 IMPLANT
MATRIX SURGICAL PSM 5X5CM (Tissue) ×3 IMPLANT
MICROMATRIX 500MG (Tissue) ×3 IMPLANT
NS IRRIG 1000ML POUR BTL (IV SOLUTION) ×3 IMPLANT
PACK GENERAL/GYN (CUSTOM PROCEDURE TRAY) ×3 IMPLANT
PACK ORTHO EXTREMITY (CUSTOM PROCEDURE TRAY) IMPLANT
PAD ABD 8X10 STRL (GAUZE/BANDAGES/DRESSINGS) IMPLANT
PAD ARMBOARD 7.5X6 YLW CONV (MISCELLANEOUS) ×6 IMPLANT
PAD NEG PRESSURE SENSATRAC (MISCELLANEOUS) IMPLANT
SET HNDPC FAN SPRY TIP SCT (DISPOSABLE) IMPLANT
SOLUTION PARTIC MCRMTRX 500MG (Tissue) ×1 IMPLANT
SPONGE GAUZE 4X4 12PLY STER LF (GAUZE/BANDAGES/DRESSINGS) ×3 IMPLANT
STOCKINETTE IMPERVIOUS 9X36 MD (GAUZE/BANDAGES/DRESSINGS) IMPLANT
STOCKINETTE IMPERVIOUS LG (DRAPES) IMPLANT
TOWEL OR 17X24 6PK STRL BLUE (TOWEL DISPOSABLE) ×3 IMPLANT
TOWEL OR 17X26 10 PK STRL BLUE (TOWEL DISPOSABLE) ×3 IMPLANT
TUBE CONNECTING 12'X1/4 (SUCTIONS) ×1
TUBE CONNECTING 12X1/4 (SUCTIONS) ×2 IMPLANT
UNDERPAD 30X30 INCONTINENT (UNDERPADS AND DIAPERS) ×3 IMPLANT
WATER STERILE IRR 1000ML POUR (IV SOLUTION) IMPLANT
YANKAUER SUCT BULB TIP NO VENT (SUCTIONS) ×3 IMPLANT

## 2015-04-07 NOTE — Brief Op Note (Addendum)
04/07/2015  4:27 PM  PATIENT:  [name]  69 y.o. female  PRE-OPERATIVE DIAGNOSIS:  ISCHIAL ULCER / sacral ulcer  POST-OPERATIVE DIAGNOSIS:  same  PROCEDURE:  Procedure(s): IRRIGATION AND DEBRIDEMENT ISCHIAL ULCER (N/A) A CELL PLACMENT (N/A)  SURGEON:  Surgeon(s) and Role:    * Loel Lofty Dillingham, DO - Primary  PHYSICIAN ASSISTANT: Shawn Rayburn, PA  ASSISTANTS: none   ANESTHESIA:   general  EBL:     BLOOD ADMINISTERED:none  DRAINS: none   LOCAL MEDICATIONS USED:  LIDOCAINE   SPECIMEN:  No Specimen  DISPOSITION OF SPECIMEN:  N/A  COUNTS:  YES  TOURNIQUET:  * No tourniquets in log *  DICTATION: .Dragon Dictation  PLAN OF CARE: Discharge to home after PACU  PATIENT DISPOSITION:  PACU - hemodynamically stable.   Delay start of Pharmacological VTE agent (>24hrs) due to surgical blood loss or risk of bleeding: no

## 2015-04-07 NOTE — Transfer of Care (Signed)
Immediate Anesthesia Transfer of Care Note  Patient: [name]  Procedure(s) Performed: Procedure(s): IRRIGATION AND DEBRIDEMENT ISCHIAL ULCER (N/A) A CELL PLACMENT (N/A)  Patient Location: PACU  Anesthesia Type:General  Level of Consciousness: awake, alert  and oriented  Airway & Oxygen Therapy: Patient Spontanous Breathing and Patient connected to nasal cannula oxygen  Post-op Assessment: Report given to RN and Post -op Vital signs reviewed and stable  Post vital signs: Reviewed and stable  Last Vitals:  Filed Vitals:   04/07/15 1259  BP: 181/86  Pulse: 67  Temp: 36.7 C  Resp: 18    Complications: No apparent anesthesia complications

## 2015-04-07 NOTE — Anesthesia Procedure Notes (Signed)
Procedure Name: Intubation Date/Time: 04/07/2015 4:22 PM Performed by: Susa Loffler Pre-anesthesia Checklist: Patient identified, Timeout performed, Emergency Drugs available, Suction available and Patient being monitored Patient Re-evaluated:Patient Re-evaluated prior to inductionOxygen Delivery Method: Circle system utilized Preoxygenation: Pre-oxygenation with 100% oxygen Intubation Type: IV induction Ventilation: Mask ventilation without difficulty Laryngoscope Size: Mac and 3 (glidescope small blade) Grade View: Grade I Tube type: Oral Tube size: 7.0 mm Airway Equipment and Method: Stylet and Video-laryngoscopy Placement Confirmation: ETT inserted through vocal cords under direct vision,  positive ETCO2 and breath sounds checked- equal and bilateral Secured at: 21 cm Tube secured with: Tape Dental Injury: Teeth and Oropharynx as per pre-operative assessment

## 2015-04-07 NOTE — Anesthesia Postprocedure Evaluation (Signed)
  Anesthesia Post-op Note  Patient: [name]  Procedure(s) Performed: Procedure(s): IRRIGATION AND DEBRIDEMENT ISCHIAL ULCER (N/A) A CELL PLACMENT (N/A)  Patient Location: PACU  Anesthesia Type:General  Level of Consciousness: awake, alert  and oriented  Airway and Oxygen Therapy: Patient Spontanous Breathing  Post-op Pain: none  Post-op Assessment: Post-op Vital signs reviewed and Patient's Cardiovascular Status Stable              Post-op Vital Signs: Reviewed  Last Vitals:  Filed Vitals:   04/07/15 1815  BP:   Pulse: 65  Temp:   Resp: 18    Complications: No apparent anesthesia complications

## 2015-04-07 NOTE — Op Note (Signed)
Operative Note   DATE OF OPERATION: 04/07/2015  LOCATION: [facility]  SURGICAL DIVISION: Plastic Surgery  PREOPERATIVE DIAGNOSES:  Sacral / ischial ulcer 3 x 3 x 2 cm with 5 cm of tunneling  POSTOPERATIVE DIAGNOSES:  same  PROCEDURE:  Excisional debridement of sacral ulcer 3 x 3 x 2 dm for placement of Acell (500 mg powder and 5 x 5 cm sheet)  SURGEON: Theodoro Kos, DO  ASSISTANT: Shawn Rayburn, PA  ANESTHESIA:  General.   COMPLICATIONS: None.   INDICATIONS FOR PROCEDURE:  The patient, [name] is a 69 y.o. female born on July 27, 1945, is here for treatment of sacral / ischial ulcer that had a fistula that was addressed by General surgery.   MRN: [id number]  CONSENT:  Informed consent was obtained directly from the patient. Risks, benefits and alternatives were fully discussed. Specific risks including but not limited to bleeding, infection, hematoma, seroma, scarring, pain, infection, contracture, asymmetry, wound healing problems, and need for further surgery were all discussed. The patient did have an ample opportunity to have questions answered to satisfaction.   DESCRIPTION OF PROCEDURE:  The patient was taken to the operating room. SCDs were placed and IV antibiotics were given. The patient's operative site was prepped and draped in a sterile fashion. A time out was performed and all information was confirmed to be correct.  General anesthesia was administered.  The area was irrigated with antibiotic solution.  The currette and #10 blade was used to debride the area of skin and subcutaneous tissue.  The area was bovied for hemostasis.  All of the Acell powder and sheet were utilized and placed into the area and tunnel.  The adaptic was applied and sutured in place with 4-0 Silk.  A cotton ball soaked in surgical gel was placed.  The patient tolerated the procedure well.  There were no complications. The patient was allowed to wake from anesthesia, extubated  and taken to the recovery room in satisfactory condition.

## 2015-04-07 NOTE — Discharge Instructions (Signed)
[place] gel to the wound daily. Keep clean. Do not soak and avoid getting wet.

## 2015-04-07 NOTE — Anesthesia Preprocedure Evaluation (Addendum)
Anesthesia Evaluation  Patient identified by MRN, date of birth, ID band Patient awake    Reviewed: Allergy & Precautions, NPO status , Patient's Chart, lab work & pertinent test results, reviewed documented beta blocker date and time   History of Anesthesia Complications (+) history of anesthetic complications  Airway Mallampati: II  TM Distance: >3 FB Neck ROM: Full    Dental  (+) Teeth Intact   Pulmonary neg pulmonary ROS,    breath sounds clear to auscultation       Cardiovascular hypertension, Pt. on medications and Pt. on home beta blockers  Rhythm:Regular Rate:Normal     Neuro/Psych PSYCHIATRIC DISORDERS Anxiety Depression negative neurological ROS     GI/Hepatic GERD  Medicated,  Endo/Other  diabetesHypothyroidism   Renal/GU   negative genitourinary   Musculoskeletal  (+) Arthritis ,   Abdominal   Peds negative pediatric ROS (+)  Hematology   Anesthesia Other Findings   Reproductive/Obstetrics negative OB ROS                           Lab Results  Component Value Date   WBC 10.0 04/02/2015   HGB 12.3 04/02/2015   HCT 37.6 04/02/2015   MCV 91.9 04/02/2015   PLT 192 04/02/2015   Lab Results  Component Value Date   CREATININE 1.85* 04/02/2015   BUN 32* 04/02/2015   NA 135 04/02/2015   K 5.3* 04/02/2015   CL 105 04/02/2015   CO2 21* 04/02/2015   Lab Results  Component Value Date   INR 1.05 01/02/2015   INR 0.99 05/17/2014   INR 1.22 10/12/2012   EKG: normal sinus rhythm, RBBB.  Echo [id number]:  - Systolic function was normal. EF 55-60%. Wall motion was normal. Grade 1 diastolic dysfunction.  - RV systolic pressure was increased consistent with moderate pulmonary hypertension  Anesthesia Physical Anesthesia Plan  ASA: III  Anesthesia Plan: General   Post-op Pain Management:    Induction: Intravenous  Airway Management Planned: Oral ETT  Additional  Equipment:   Intra-op Plan:   Post-operative Plan: Extubation in OR  Informed Consent: I have reviewed the patients History and Physical, chart, labs and discussed the procedure including the risks, benefits and alternatives for the proposed anesthesia with the patient or authorized representative who has indicated his/her understanding and acceptance.   Dental advisory given  Plan Discussed with: CRNA  Anesthesia Plan Comments:         Anesthesia Quick Evaluation

## 2015-04-07 NOTE — OR Nursing (Signed)
Pt and husband given d/c instructions. Patient will not take narcotic 2nd to extreme stomach distress experienced in the past with taking such medication.  Patient and husband decline prescription and state they do not need another. Prescription placed in shred-it box.

## 2015-04-07 NOTE — H&P (Signed)
[name] is an 69 y.o. female.   Chief Complaint: sacral ulcer HPI: The patient is a 69 yrs old wf here with her husband for treatment of a sacral / ischial ulcer.  She was diagnosed with a fistula.  She underwent a rubber band type procedure. The area is clean but very slow to heal.  The decision was made for debridement and Acell placement to aid in healing.  Past Medical History  Diagnosis Date  . Hypertension   . Hyperlipidemia   . GERD (gastroesophageal reflux disease)   . GAD (generalized anxiety disorder)   . Hypothyroidism   . Type II diabetes mellitus ([place])   . Right bundle branch block   . LAFB (left anterior fascicular block)   . History of rectal abscess     12-29-2004  bedside I & D  . History of GI bleed     upper 2009  due to esophagitis  &  2002  due to Mallory-Weiss tear  . History of colon polyps     benign  . Diverticulosis of colon   . History of esophagitis   . Diabetic gastroparesis ([place])   . History of gout     in issues for several years  . Sacral decubitus ulcer     since 2014- 01-02-15 remains with wound" gauze dressing changes daily.  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 Coronary artery disease   . CKD (chronic kidney disease), stage III     nephrologist--  dr Mercy Moore  . Diabetic retinopathy ([place])     bilateral --  monitored by dr [name]  . Arthritis     knees and hand/fingers. "broke back"-being evaluated for this"weakness left leg"  . Anemia in chronic renal disease     Aranesp injection --  when Hg <11, last injection 12-24-14.  Marland Kitchen Anxiety   . Constipation   . Complication of anesthesia     post-op confusion     Past Surgical History  Procedure Laterality Date  . Retinopathy surgery Bilateral 1980's?  . Orif femur fracture Left 10/09/2012    Procedure: OPEN REDUCTION INTERNAL FIXATION (ORIF) DISTAL FEMUR FRACTURE;  Surgeon: Rozanna Box, MD;  Location: [facility];  Service: Orthopedics;  Laterality: Left;  .  Compression hip screw Right 05/18/2014    Procedure: COMPRESSION HIP;  Surgeon: Carole Civil, MD;  Location: [facility];  Service: Orthopedics;  Laterality: Right;  . Esophagogastroduodenoscopy  last one 01-09-2011  . Transthoracic echocardiogram  02-18-2011    mild LVH,  ef 55-60%,  grade I diastolic dysfunction/  mild TR/  RV systolic pressure increased consistant with moderate pulmonary hypertension  . Cardiovascular stress test  12-30-2004    normal perfusion study/  no ischemia or infartion/  normal LV wall function and wall motion , ef 66%  . Colonoscopy w/ polypectomy  last one 2008  . Cataract extraction w/ intraocular lens  implant, bilateral  1995  . Incision and drainage of wound N/A 09/30/2014    Procedure: IRRIGATION AND DEBRIDEMENT SACRAL WOUND, EXCISION OF PERIRECTAL TRACT WITH PLACEMENT OF ACCELL;  Surgeon: Theodoro Kos, DO;  Location: [facility];  Service: Plastics;  Laterality: N/A;  . Evaluation under anesthesia with fistulectomy N/A 01/06/2015    Procedure: EXAM UNDER ANESTHESIA , placement of seton;  Surgeon: Jackolyn Confer, MD;  Location: [facility];  Service: General;  Laterality: N/A;    Family History  Problem Relation Age of  Onset  . Colon cancer Father   . Prostate cancer Father   . Diabetes Father   . Coronary artery disease Mother 13  . Heart disease Mother   . Diabetes Mother   . Coronary artery disease Sister 25  . Diabetes Sister   . Heart disease Sister   . Diabetes Maternal Grandmother   . Diabetes Sister   . Diabetes Sister   . Diabetes Sister   . Diabetes Sister   . Diabetes Sister    Social History:  reports that she has never smoked. She has never used smokeless tobacco. She reports that she does not drink alcohol or use illicit drugs.  Allergies:  Allergies  Allergen Reactions  . Aspirin Nausea And Vomiting  . Ciprofloxacin Other (See Comments)    Upset Stomach  . Codeine Nausea And Vomiting    Makes me sick   . Micardis  [Telmisartan] Other (See Comments)    unknown  . Other     No otc pain medications  . Rofecoxib Other (See Comments)    unknown    Medications Prior to Admission  Medication Sig Dispense Refill  . atorvastatin (LIPITOR) 40 MG tablet Take 1 tablet (40 mg total) by mouth every evening. 90 tablet 1  . calcitRIOL (ROCALTROL) 0.25 MCG capsule TAKE 1 CAPSULE ONE TIME DAILY 90 capsule 1  . cloNIDine (CATAPRES) 0.1 MG tablet Take 1 tablet (0.1 mg total) by mouth 2 (two) times daily. 180 tablet 1  . furosemide (LASIX) 40 MG tablet Take 1 tablet (40 mg total) by mouth every morning. 90 tablet 1  . glimepiride (AMARYL) 4 MG tablet Take 1 tablet (4 mg total) by mouth 2 (two) times daily. 180 tablet 1  . hydrALAZINE (APRESOLINE) 50 MG tablet Take 1 tablet (50 mg total) by mouth every 8 (eight) hours.    Marland Kitchen levothyroxine (SYNTHROID, LEVOTHROID) 75 MCG tablet Take 1 tablet (75 mcg total) by mouth daily before breakfast. 90 tablet 1  . losartan (COZAAR) 100 MG tablet Take 1 tablet (100 mg total) by mouth every morning. 90 tablet 1  . metoprolol succinate (TOPROL-XL) 50 MG 24 hr tablet Take 1 tablet (50 mg total) by mouth every morning. 90 tablet 1  . ondansetron (ZOFRAN) 4 MG tablet Take 1 tablet (4 mg total) by mouth every 8 (eight) hours as needed for nausea or vomiting. 180 tablet 1  . pantoprazole (PROTONIX) 40 MG tablet Take 1 tablet (40 mg total) by mouth 2 (two) times daily. 90 tablet 1  . potassium chloride SA (K-DUR,KLOR-CON) 20 MEQ tablet Take 1 tablet (20 mEq total) by mouth 2 (two) times daily. 90 tablet 1  . glucose blood (ONETOUCH VERIO) test strip Test 1 time per day and prn  E11.9 100 each 12  . HYDROcodone-acetaminophen (NORCO/VICODIN) 5-325 MG per tablet Take 1-2 tablets by mouth every 4 (four) hours as needed for moderate pain or severe pain.    Marland Kitchen linagliptin (TRADJENTA) 5 MG TABS tablet Take 1 tablet (5 mg total) by mouth daily. 30 tablet 0  . ONETOUCH DELICA LANCETS 99991111 MISC Test one  time per day and prn 100 each 5    Results for orders placed or performed during the hospital encounter of 04/07/15 (from the past 48 hour(s))  Glucose, capillary     Status: Abnormal   Collection Time: 04/07/15  1:04 PM  Result Value Ref Range   Glucose-Capillary 218 (H) 65 - 99 mg/dL  Glucose, capillary     Status:  Abnormal   Collection Time: 04/07/15  3:32 PM  Result Value Ref Range   Glucose-Capillary 213 (H) 65 - 99 mg/dL   No results found.  Review of Systems  Constitutional: Negative.   HENT: Negative.   Eyes: Negative.   Respiratory: Negative.   Cardiovascular: Negative.   Gastrointestinal: Negative.   Genitourinary: Negative.   Musculoskeletal: Negative.   Skin: Negative.   Neurological: Negative.   Psychiatric/Behavioral: Negative.     Blood pressure 181/86, pulse 67, temperature 98 F (36.7 C), temperature source Oral, resp. rate 18, height 5\' 2"  (1.575 m), weight 72.576 kg (160 lb), SpO2 100 %. Physical Exam  Constitutional: She is oriented to person, place, and time. She appears well-developed and well-nourished.  HENT:  Head: Normocephalic and atraumatic.  Eyes: Conjunctivae and EOM are normal. Pupils are equal, round, and reactive to light.  Cardiovascular: Normal rate.   Respiratory: Effort normal.  GI: Soft.  Neurological: She is alert and oriented to person, place, and time.  Skin: Skin is warm.  Psychiatric: She has a normal mood and affect. Her behavior is normal. Judgment and thought content normal.     Assessment/Plan Plan for debridement and placement of Acell to the sacral ulcer.  Wallace Going 04/07/2015, 4:21 PM

## 2015-04-08 ENCOUNTER — Encounter (HOSPITAL_COMMUNITY): Payer: Self-pay | Admitting: Plastic Surgery

## 2015-04-09 DIAGNOSIS — I5030 Unspecified diastolic (congestive) heart failure: Secondary | ICD-10-CM | POA: Diagnosis not present

## 2015-04-09 DIAGNOSIS — E1122 Type 2 diabetes mellitus with diabetic chronic kidney disease: Secondary | ICD-10-CM | POA: Diagnosis not present

## 2015-04-09 DIAGNOSIS — E11319 Type 2 diabetes mellitus with unspecified diabetic retinopathy without macular edema: Secondary | ICD-10-CM | POA: Diagnosis not present

## 2015-04-09 DIAGNOSIS — I130 Hypertensive heart and chronic kidney disease with heart failure and stage 1 through stage 4 chronic kidney disease, or unspecified chronic kidney disease: Secondary | ICD-10-CM | POA: Diagnosis not present

## 2015-04-09 DIAGNOSIS — N183 Chronic kidney disease, stage 3 (moderate): Secondary | ICD-10-CM | POA: Diagnosis not present

## 2015-04-09 DIAGNOSIS — K602 Anal fissure, unspecified: Secondary | ICD-10-CM | POA: Diagnosis not present

## 2015-04-11 DIAGNOSIS — N183 Chronic kidney disease, stage 3 (moderate): Secondary | ICD-10-CM | POA: Diagnosis not present

## 2015-04-11 DIAGNOSIS — I130 Hypertensive heart and chronic kidney disease with heart failure and stage 1 through stage 4 chronic kidney disease, or unspecified chronic kidney disease: Secondary | ICD-10-CM | POA: Diagnosis not present

## 2015-04-11 DIAGNOSIS — K602 Anal fissure, unspecified: Secondary | ICD-10-CM | POA: Diagnosis not present

## 2015-04-11 DIAGNOSIS — E1122 Type 2 diabetes mellitus with diabetic chronic kidney disease: Secondary | ICD-10-CM | POA: Diagnosis not present

## 2015-04-11 DIAGNOSIS — I5030 Unspecified diastolic (congestive) heart failure: Secondary | ICD-10-CM | POA: Diagnosis not present

## 2015-04-11 DIAGNOSIS — E11319 Type 2 diabetes mellitus with unspecified diabetic retinopathy without macular edema: Secondary | ICD-10-CM | POA: Diagnosis not present

## 2015-04-14 ENCOUNTER — Encounter (HOSPITAL_BASED_OUTPATIENT_CLINIC_OR_DEPARTMENT_OTHER): Payer: Medicare Other | Attending: Plastic Surgery

## 2015-04-14 DIAGNOSIS — K604 Rectal fistula: Secondary | ICD-10-CM | POA: Insufficient documentation

## 2015-04-14 DIAGNOSIS — E1121 Type 2 diabetes mellitus with diabetic nephropathy: Secondary | ICD-10-CM | POA: Insufficient documentation

## 2015-04-14 DIAGNOSIS — L89622 Pressure ulcer of left heel, stage 2: Secondary | ICD-10-CM | POA: Insufficient documentation

## 2015-04-14 DIAGNOSIS — X58XXXA Exposure to other specified factors, initial encounter: Secondary | ICD-10-CM | POA: Insufficient documentation

## 2015-04-14 DIAGNOSIS — S31839A Unspecified open wound of anus, initial encounter: Secondary | ICD-10-CM | POA: Diagnosis not present

## 2015-04-15 DIAGNOSIS — N183 Chronic kidney disease, stage 3 (moderate): Secondary | ICD-10-CM | POA: Diagnosis not present

## 2015-04-15 DIAGNOSIS — E11319 Type 2 diabetes mellitus with unspecified diabetic retinopathy without macular edema: Secondary | ICD-10-CM | POA: Diagnosis not present

## 2015-04-15 DIAGNOSIS — I130 Hypertensive heart and chronic kidney disease with heart failure and stage 1 through stage 4 chronic kidney disease, or unspecified chronic kidney disease: Secondary | ICD-10-CM | POA: Diagnosis not present

## 2015-04-15 DIAGNOSIS — E1122 Type 2 diabetes mellitus with diabetic chronic kidney disease: Secondary | ICD-10-CM | POA: Diagnosis not present

## 2015-04-15 DIAGNOSIS — K602 Anal fissure, unspecified: Secondary | ICD-10-CM | POA: Diagnosis not present

## 2015-04-15 DIAGNOSIS — I5030 Unspecified diastolic (congestive) heart failure: Secondary | ICD-10-CM | POA: Diagnosis not present

## 2015-04-18 DIAGNOSIS — E11319 Type 2 diabetes mellitus with unspecified diabetic retinopathy without macular edema: Secondary | ICD-10-CM | POA: Diagnosis not present

## 2015-04-18 DIAGNOSIS — E1122 Type 2 diabetes mellitus with diabetic chronic kidney disease: Secondary | ICD-10-CM | POA: Diagnosis not present

## 2015-04-18 DIAGNOSIS — I130 Hypertensive heart and chronic kidney disease with heart failure and stage 1 through stage 4 chronic kidney disease, or unspecified chronic kidney disease: Secondary | ICD-10-CM | POA: Diagnosis not present

## 2015-04-18 DIAGNOSIS — K602 Anal fissure, unspecified: Secondary | ICD-10-CM | POA: Diagnosis not present

## 2015-04-18 DIAGNOSIS — I5030 Unspecified diastolic (congestive) heart failure: Secondary | ICD-10-CM | POA: Diagnosis not present

## 2015-04-18 DIAGNOSIS — N183 Chronic kidney disease, stage 3 (moderate): Secondary | ICD-10-CM | POA: Diagnosis not present

## 2015-04-19 DIAGNOSIS — E11319 Type 2 diabetes mellitus with unspecified diabetic retinopathy without macular edema: Secondary | ICD-10-CM | POA: Diagnosis not present

## 2015-04-19 DIAGNOSIS — K602 Anal fissure, unspecified: Secondary | ICD-10-CM | POA: Diagnosis not present

## 2015-04-19 DIAGNOSIS — E1122 Type 2 diabetes mellitus with diabetic chronic kidney disease: Secondary | ICD-10-CM | POA: Diagnosis not present

## 2015-04-19 DIAGNOSIS — I5030 Unspecified diastolic (congestive) heart failure: Secondary | ICD-10-CM | POA: Diagnosis not present

## 2015-04-19 DIAGNOSIS — N183 Chronic kidney disease, stage 3 (moderate): Secondary | ICD-10-CM | POA: Diagnosis not present

## 2015-04-19 DIAGNOSIS — I130 Hypertensive heart and chronic kidney disease with heart failure and stage 1 through stage 4 chronic kidney disease, or unspecified chronic kidney disease: Secondary | ICD-10-CM | POA: Diagnosis not present

## 2015-04-21 DIAGNOSIS — K602 Anal fissure, unspecified: Secondary | ICD-10-CM | POA: Diagnosis not present

## 2015-04-21 DIAGNOSIS — I5030 Unspecified diastolic (congestive) heart failure: Secondary | ICD-10-CM | POA: Diagnosis not present

## 2015-04-21 DIAGNOSIS — E11319 Type 2 diabetes mellitus with unspecified diabetic retinopathy without macular edema: Secondary | ICD-10-CM | POA: Diagnosis not present

## 2015-04-21 DIAGNOSIS — I130 Hypertensive heart and chronic kidney disease with heart failure and stage 1 through stage 4 chronic kidney disease, or unspecified chronic kidney disease: Secondary | ICD-10-CM | POA: Diagnosis not present

## 2015-04-21 DIAGNOSIS — E1122 Type 2 diabetes mellitus with diabetic chronic kidney disease: Secondary | ICD-10-CM | POA: Diagnosis not present

## 2015-04-21 DIAGNOSIS — N183 Chronic kidney disease, stage 3 (moderate): Secondary | ICD-10-CM | POA: Diagnosis not present

## 2015-04-23 DIAGNOSIS — E1122 Type 2 diabetes mellitus with diabetic chronic kidney disease: Secondary | ICD-10-CM | POA: Diagnosis not present

## 2015-04-23 DIAGNOSIS — N183 Chronic kidney disease, stage 3 (moderate): Secondary | ICD-10-CM | POA: Diagnosis not present

## 2015-04-23 DIAGNOSIS — E11319 Type 2 diabetes mellitus with unspecified diabetic retinopathy without macular edema: Secondary | ICD-10-CM | POA: Diagnosis not present

## 2015-04-23 DIAGNOSIS — I130 Hypertensive heart and chronic kidney disease with heart failure and stage 1 through stage 4 chronic kidney disease, or unspecified chronic kidney disease: Secondary | ICD-10-CM | POA: Diagnosis not present

## 2015-04-23 DIAGNOSIS — I5030 Unspecified diastolic (congestive) heart failure: Secondary | ICD-10-CM | POA: Diagnosis not present

## 2015-04-23 DIAGNOSIS — K602 Anal fissure, unspecified: Secondary | ICD-10-CM | POA: Diagnosis not present

## 2015-04-25 DIAGNOSIS — I5030 Unspecified diastolic (congestive) heart failure: Secondary | ICD-10-CM | POA: Diagnosis not present

## 2015-04-25 DIAGNOSIS — E1122 Type 2 diabetes mellitus with diabetic chronic kidney disease: Secondary | ICD-10-CM | POA: Diagnosis not present

## 2015-04-25 DIAGNOSIS — N183 Chronic kidney disease, stage 3 (moderate): Secondary | ICD-10-CM | POA: Diagnosis not present

## 2015-04-25 DIAGNOSIS — I130 Hypertensive heart and chronic kidney disease with heart failure and stage 1 through stage 4 chronic kidney disease, or unspecified chronic kidney disease: Secondary | ICD-10-CM | POA: Diagnosis not present

## 2015-04-25 DIAGNOSIS — K602 Anal fissure, unspecified: Secondary | ICD-10-CM | POA: Diagnosis not present

## 2015-04-25 DIAGNOSIS — E11319 Type 2 diabetes mellitus with unspecified diabetic retinopathy without macular edema: Secondary | ICD-10-CM | POA: Diagnosis not present

## 2015-04-28 ENCOUNTER — Encounter (HOSPITAL_BASED_OUTPATIENT_CLINIC_OR_DEPARTMENT_OTHER): Payer: Medicare Other | Attending: Plastic Surgery

## 2015-04-28 DIAGNOSIS — T8189XA Other complications of procedures, not elsewhere classified, initial encounter: Secondary | ICD-10-CM | POA: Insufficient documentation

## 2015-04-28 DIAGNOSIS — Y838 Other surgical procedures as the cause of abnormal reaction of the patient, or of later complication, without mention of misadventure at the time of the procedure: Secondary | ICD-10-CM | POA: Insufficient documentation

## 2015-04-28 DIAGNOSIS — S31819A Unspecified open wound of right buttock, initial encounter: Secondary | ICD-10-CM | POA: Insufficient documentation

## 2015-04-28 DIAGNOSIS — E1121 Type 2 diabetes mellitus with diabetic nephropathy: Secondary | ICD-10-CM | POA: Diagnosis not present

## 2015-04-28 DIAGNOSIS — K604 Rectal fistula: Secondary | ICD-10-CM | POA: Insufficient documentation

## 2015-04-29 ENCOUNTER — Encounter (HOSPITAL_COMMUNITY)
Admission: RE | Admit: 2015-04-29 | Discharge: 2015-04-29 | Disposition: A | Discharge status: Home / Self Care | Payer: Medicare Other | Source: Ambulatory Visit | Attending: Nephrology | Admitting: Nephrology

## 2015-04-29 DIAGNOSIS — N183 Chronic kidney disease, stage 3 (moderate): Secondary | ICD-10-CM | POA: Insufficient documentation

## 2015-04-29 LAB — IRON AND TIBC
IRON: 75 ug/dL (ref 28–170)
Saturation Ratios: 29 % (ref 10.4–31.8)
TIBC: 258 ug/dL (ref 250–450)
UIBC: 183 ug/dL

## 2015-04-29 LAB — HEMOGLOBIN AND HEMATOCRIT, BLOOD
HCT: 36.2 % (ref 36.0–46.0)
Hemoglobin: 12 g/dL (ref 12.0–15.0)

## 2015-04-29 LAB — FERRITIN: Ferritin: 498 ng/mL — ABNORMAL HIGH (ref 11–307)

## 2015-04-29 NOTE — Progress Notes (Signed)
hgb 12 therefore med not given . To return in 2 wks for repeat test.

## 2015-04-30 DIAGNOSIS — K602 Anal fissure, unspecified: Secondary | ICD-10-CM | POA: Diagnosis not present

## 2015-04-30 DIAGNOSIS — I5030 Unspecified diastolic (congestive) heart failure: Secondary | ICD-10-CM | POA: Diagnosis not present

## 2015-04-30 DIAGNOSIS — I130 Hypertensive heart and chronic kidney disease with heart failure and stage 1 through stage 4 chronic kidney disease, or unspecified chronic kidney disease: Secondary | ICD-10-CM | POA: Diagnosis not present

## 2015-04-30 DIAGNOSIS — N183 Chronic kidney disease, stage 3 (moderate): Secondary | ICD-10-CM | POA: Diagnosis not present

## 2015-04-30 DIAGNOSIS — E11319 Type 2 diabetes mellitus with unspecified diabetic retinopathy without macular edema: Secondary | ICD-10-CM | POA: Diagnosis not present

## 2015-04-30 DIAGNOSIS — M5416 Radiculopathy, lumbar region: Secondary | ICD-10-CM | POA: Diagnosis not present

## 2015-04-30 DIAGNOSIS — E1122 Type 2 diabetes mellitus with diabetic chronic kidney disease: Secondary | ICD-10-CM | POA: Diagnosis not present

## 2015-05-01 ENCOUNTER — Ambulatory Visit (INDEPENDENT_AMBULATORY_CARE_PROVIDER_SITE_OTHER): Payer: Medicare Other | Admitting: "Endocrinology

## 2015-05-01 ENCOUNTER — Encounter: Payer: Self-pay | Admitting: "Endocrinology

## 2015-05-01 ENCOUNTER — Telehealth: Payer: Self-pay | Admitting: "Endocrinology

## 2015-05-01 VITALS — BP 139/66 | HR 58 | Ht 62.0 in | Wt 153.4 lb

## 2015-05-01 DIAGNOSIS — I10 Essential (primary) hypertension: Secondary | ICD-10-CM | POA: Diagnosis not present

## 2015-05-01 DIAGNOSIS — E039 Hypothyroidism, unspecified: Secondary | ICD-10-CM | POA: Diagnosis not present

## 2015-05-01 DIAGNOSIS — E1122 Type 2 diabetes mellitus with diabetic chronic kidney disease: Secondary | ICD-10-CM

## 2015-05-01 DIAGNOSIS — N184 Chronic kidney disease, stage 4 (severe): Secondary | ICD-10-CM | POA: Diagnosis not present

## 2015-05-01 DIAGNOSIS — R739 Hyperglycemia, unspecified: Secondary | ICD-10-CM | POA: Diagnosis not present

## 2015-05-01 MED ORDER — LINAGLIPTIN 5 MG PO TABS: 5.0000 mg | ORAL_TABLET | Freq: Every day | ORAL | Status: DC

## 2015-05-01 NOTE — Telephone Encounter (Signed)
[name] said that her prescription hasn't been called into the pharmacy - she didn't let me know what prescription is was though.Marland KitchenMarland Kitchen

## 2015-05-01 NOTE — Patient Instructions (Signed)

## 2015-05-01 NOTE — Progress Notes (Signed)
Subjective:    Patient ID: [name], female    DOB: 1946-03-11. Patient is being seen in consultation for management of diabetes requested by  Chevis Pretty, FNP  Past Medical History  Diagnosis Date  . Hypertension   . Hyperlipidemia   . GERD (gastroesophageal reflux disease)   . GAD (generalized anxiety disorder)   . Hypothyroidism   . Type II diabetes mellitus ([place])   . Right bundle branch block   . LAFB (left anterior fascicular block)   . History of rectal abscess     12-29-2004  bedside I & D  . History of GI bleed     upper 2009  due to esophagitis  &  2002  due to Mallory-Weiss tear  . History of colon polyps     benign  . Diverticulosis of colon   . History of esophagitis   . Diabetic gastroparesis ([place])   . History of gout     in issues for several years  . Sacral decubitus ulcer     since 2014- 01-02-15 remains with wound" gauze dressing changes daily.  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 Coronary artery disease   . CKD (chronic kidney disease), stage III     nephrologist--  dr Mercy Moore  . Diabetic retinopathy ([place])     bilateral --  monitored by dr [name]  . Arthritis     knees and hand/fingers. "broke back"-being evaluated for this"weakness left leg"  . Anemia in chronic renal disease     Aranesp injection --  when Hg <11, last injection 12-24-14.  Marland Kitchen Anxiety   . Constipation   . Complication of anesthesia     post-op confusion    Past Surgical History  Procedure Laterality Date  . Retinopathy surgery Bilateral 1980's?  . Orif femur fracture Left 10/09/2012    Procedure: OPEN REDUCTION INTERNAL FIXATION (ORIF) DISTAL FEMUR FRACTURE;  Surgeon: Rozanna Box, MD;  Location: [facility];  Service: Orthopedics;  Laterality: Left;  . Compression hip screw Right 05/18/2014    Procedure: COMPRESSION HIP;  Surgeon: Carole Civil, MD;  Location: [facility];  Service: Orthopedics;  Laterality: Right;  .  Esophagogastroduodenoscopy  last one 01-09-2011  . Transthoracic echocardiogram  02-18-2011    mild LVH,  ef 55-60%,  grade I diastolic dysfunction/  mild TR/  RV systolic pressure increased consistant with moderate pulmonary hypertension  . Cardiovascular stress test  12-30-2004    normal perfusion study/  no ischemia or infartion/  normal LV wall function and wall motion , ef 66%  . Colonoscopy w/ polypectomy  last one 2008  . Cataract extraction w/ intraocular lens  implant, bilateral  1995  . Incision and drainage of wound N/A 09/30/2014    Procedure: IRRIGATION AND DEBRIDEMENT SACRAL WOUND, EXCISION OF PERIRECTAL TRACT WITH PLACEMENT OF ACCELL;  Surgeon: Theodoro Kos, DO;  Location: [facility];  Service: Plastics;  Laterality: N/A;  . Evaluation under anesthesia with fistulectomy N/A 01/06/2015    Procedure: EXAM UNDER ANESTHESIA , placement of seton;  Surgeon: Jackolyn Confer, MD;  Location: [facility];  Service: General;  Laterality: N/A;  . I&d extremity N/A 04/07/2015    Procedure: IRRIGATION AND DEBRIDEMENT ISCHIAL ULCER;  Surgeon: Loel Lofty Dillingham, DO;  Location: [facility];  Service: Plastics;  Laterality: N/A;  . Application of a-cell of extremity N/A 04/07/2015    Procedure: A CELL PLACMENT;  Surgeon: Loel Lofty  Dillingham, DO;  Location: [facility];  Service: Plastics;  Laterality: N/A;   Social History   Social History  . Marital Status: Married    Spouse Name: N/A  . Number of Children: 0  . Years of Education: N/A   Occupational History  .     Social History Main Topics  . Smoking status: Never Smoker   . Smokeless tobacco: Never Used  . Alcohol Use: No  . Drug Use: No  . Sexual Activity: Not Asked   Other Topics Concern  . None   Social History Narrative   Outpatient Encounter Prescriptions as of 05/01/2015  Medication Sig  . atorvastatin (LIPITOR) 40 MG tablet Take 1 tablet (40 mg total) by mouth every evening.  . calcitRIOL (ROCALTROL) 0.25 MCG capsule  TAKE 1 CAPSULE ONE TIME DAILY  . cloNIDine (CATAPRES) 0.1 MG tablet Take 1 tablet (0.1 mg total) by mouth 2 (two) times daily.  . furosemide (LASIX) 40 MG tablet Take 1 tablet (40 mg total) by mouth every morning.  Marland Kitchen glucose blood (ONETOUCH VERIO) test strip Test 1 time per day and prn  E11.9  . hydrALAZINE (APRESOLINE) 50 MG tablet Take 1 tablet (50 mg total) by mouth every 8 (eight) hours.  Marland Kitchen HYDROcodone-acetaminophen (NORCO) 5-325 MG tablet Take 1 tablet by mouth every 6 (six) hours as needed for moderate pain.  Marland Kitchen levothyroxine (SYNTHROID, LEVOTHROID) 75 MCG tablet Take 1 tablet (75 mcg total) by mouth daily before breakfast.  . linagliptin (TRADJENTA) 5 MG TABS tablet Take 1 tablet (5 mg total) by mouth daily.  Marland Kitchen losartan (COZAAR) 100 MG tablet Take 1 tablet (100 mg total) by mouth every morning.  . metoprolol succinate (TOPROL-XL) 50 MG 24 hr tablet Take 1 tablet (50 mg total) by mouth every morning.  Glory Rosebush DELICA LANCETS [id number] MISC Test one time per day and prn  . pantoprazole (PROTONIX) 40 MG tablet Take 1 tablet (40 mg total) by mouth 2 (two) times daily.  . potassium chloride SA (K-DUR,KLOR-CON) 20 MEQ tablet Take 1 tablet (20 mEq total) by mouth 2 (two) times daily.  . [DISCONTINUED] glimepiride (AMARYL) 4 MG tablet Take 1 tablet (4 mg total) by mouth 2 (two) times daily.  . [DISCONTINUED] linagliptin (TRADJENTA) 5 MG TABS tablet Take 1 tablet (5 mg total) by mouth daily.  . [DISCONTINUED] linagliptin (TRADJENTA) 5 MG TABS tablet Take 1 tablet (5 mg total) by mouth daily.  . ondansetron (ZOFRAN) 4 MG tablet Take 1 tablet (4 mg total) by mouth every 8 (eight) hours as needed for nausea or vomiting.   No facility-administered encounter medications on file as of 05/01/2015.   ALLERGIES: Allergies  Allergen Reactions  . Amoxicillin   . Aspirin Nausea And Vomiting  . Ciprofloxacin Other (See Comments)    Upset Stomach  . Codeine Nausea And Vomiting    Makes me sick   . Micardis  [Telmisartan] Other (See Comments)    unknown  . Nexium [Esomeprazole Magnesium]   . Niaspan [Niacin Er]   . Onglyza [Saxagliptin]   . Other     No otc pain medications  . Rofecoxib Other (See Comments)    unknown  . Simvastatin   . Tequin [Gatifloxacin]   . Welchol [Colesevelam Hcl]    VACCINATION STATUS: Immunization History  Administered Date(s) Administered  . Influenza,inj,Quad PF,36+ Mos 02/16/2013, 02/27/2015  . Pneumococcal Conjugate-13 10/11/2014  . Pneumococcal Polysaccharide-23 10/10/2012  . Tdap 10/06/2012    Diabetes She presents for her initial diabetic  visit. She has type 2 diabetes mellitus. Onset time: She was diagnosed at approximate age of 68 years . Her disease course has been worsening. There are no hypoglycemic associated symptoms. Pertinent negatives for hypoglycemia include no confusion, headaches, pallor or seizures. Associated symptoms include fatigue, polydipsia, polyuria and visual change. Pertinent negatives for diabetes include no chest pain and no polyphagia. There are no hypoglycemic complications. Symptoms are worsening. Diabetic complications include nephropathy, peripheral neuropathy, PVD and retinopathy. Risk factors for coronary artery disease include diabetes mellitus, dyslipidemia, hypertension and sedentary lifestyle. Current diabetic treatment includes oral agent (monotherapy). She is compliant with treatment most of the time. Weight trend: Wheelchair-bound. She is following a generally unhealthy diet. When asked about meal planning, she reported none. She has not had a previous visit with a dietitian. She never participates in exercise. Her home blood glucose trend is fluctuating dramatically. Her overall blood glucose range is >200 mg/dl. An ACE inhibitor/angiotensin II receptor blocker is being taken. Eye exam is current.  Thyroid Problem Presents for initial visit. Onset time: 15 years. Symptoms include fatigue and visual change. Patient reports  no cold intolerance, diarrhea, heat intolerance or palpitations. The symptoms have been stable. Past treatments include levothyroxine. The following procedures have not been performed: thyroidectomy.  Hypertension This is a chronic problem. The current episode started more than 1 year ago. Pertinent negatives include no chest pain, headaches, palpitations or shortness of breath. Past treatments include angiotensin blockers. Hypertensive end-organ damage includes PVD, retinopathy and a thyroid problem.      Review of Systems  Constitutional: Positive for fatigue. Negative for unexpected weight change.  HENT: Negative for trouble swallowing and voice change.   Eyes: Negative for visual disturbance.  Respiratory: Negative for cough, shortness of breath and wheezing.   Cardiovascular: Negative for chest pain, palpitations and leg swelling.  Gastrointestinal: Negative for nausea, vomiting and diarrhea.  Endocrine: Positive for polydipsia and polyuria. Negative for cold intolerance, heat intolerance and polyphagia.  Musculoskeletal: Positive for myalgias, back pain, joint swelling, arthralgias and gait problem.       She is wheelchair-bound due to recent hip fracture and diffuse debilitating arthritis.  Skin: Negative for color change, pallor, rash and wound.  Neurological: Negative for seizures and headaches.  Psychiatric/Behavioral: Negative for suicidal ideas and confusion.    Objective:    BP 139/66 mmHg  Pulse 58  Ht 5\' 2"  (1.575 m)  Wt 153 lb 6.4 oz (69.582 kg)  BMI 28.05 kg/m2  SpO2 100%  Wt Readings from Last 3 Encounters:  05/01/15 153 lb 6.4 oz (69.582 kg)  04/07/15 160 lb (72.576 kg)  04/02/15 160 lb (72.576 kg)    Physical Exam  Constitutional: She is oriented to person, place, and time. She appears well-developed.  HENT:  Head: Normocephalic and atraumatic.  Eyes: EOM are normal.  Neck: Normal range of motion. Neck supple. No tracheal deviation present. No thyromegaly  present.  Cardiovascular: Normal rate and regular rhythm.  Exam reveals decreased pulses.   Pulses:      Dorsalis pedis pulses are 0 on the right side, and 0 on the left side.       Posterior tibial pulses are 0 on the right side, and 0 on the left side.  Pulmonary/Chest: Effort normal and breath sounds normal.  Abdominal: Soft. Bowel sounds are normal. There is no tenderness. There is no guarding.  Musculoskeletal: She exhibits no edema.       Arms: She is wheelchair-bound due to recent hip fracture  and diffuse debilitating arthritis.  Neurological: She is alert and oriented to person, place, and time. She has normal reflexes. A sensory deficit is present. No cranial nerve deficit. Coordination normal.  Skin: Skin is warm and dry. No rash noted. No erythema. No pallor.  Psychiatric: She has a normal mood and affect. Judgment normal.    Results for orders placed or performed during the hospital encounter of 04/29/15  Hemoglobin and hematocrit, blood  Result Value Ref Range   Hemoglobin 12.0 12.0 - 15.0 g/dL   HCT 36.2 36.0 - 46.0 %  Iron and TIBC  Result Value Ref Range   Iron 75 28 - 170 ug/dL   TIBC 258 250 - 450 ug/dL   Saturation Ratios 29 10.4 - 31.8 %   UIBC 183 ug/dL  Ferritin  Result Value Ref Range   Ferritin 498 (H) 11 - 307 ng/mL   Complete Blood Count (Most recent): Lab Results  Component Value Date   WBC 10.0 04/02/2015   HGB 12.0 04/29/2015   HCT 36.2 04/29/2015   MCV 91.9 04/02/2015   PLT 192 04/02/2015   Chemistry (most recent): Lab Results  Component Value Date   NA 135 04/02/2015   K 5.3* 04/02/2015   CL 105 04/02/2015   CO2 21* 04/02/2015   BUN 32* 04/02/2015   CREATININE 1.85* 04/02/2015   Diabetic Labs (most recent): Lab Results  Component Value Date   HGBA1C 10.0* 04/02/2015   HGBA1C 6.9* 01/24/2015   HGBA1C 8.8* 08/26/2014   Lipid profile (most recent): Lab Results  Component Value Date   TRIG 170* 10/11/2014   CHOL 153 10/11/2014          Assessment & Plan:   1. Type 2 diabetes mellitus with stage 4 chronic kidney disease, without long-term current use of insulin ([place])   - Patient has currently uncontrolled symptomatic type 2 DM since  69 years of age,  with most recent A1c of  10 %. Recent labs reviewed.   -her  diabetes is complicated by PAD, CKD, neuropathy and patient remains at a high risk for more acute and chronic complications of diabetes which include CAD, CVA, CKD, retinopathy, and neuropathy. These are all discussed in detail with the patient.  - I have counseled the patient on diet management  by adopting a carbohydrate restricted/protein rich diet.  - Suggestion is made for patient to avoid simple carbohydrates   from their diet including Cakes , Desserts, Ice Cream,  Soda (  diet and regular) , Sweet Tea , Candies,  Chips, Cookies, Artificial Sweeteners,   and "Sugar-free" Products . This will help patient to have stable blood glucose profile and potentially avoid unintended weight gain.  - I encouraged the patient to switch to  unprocessed or minimally processed complex starch and increased protein intake (animal or plant source), fruits, and vegetables.  - Patient is advised to stick to a routine mealtimes to eat 3 meals  a day and avoid unnecessary snacks ( to snack only to correct hypoglycemia).  - The patient will be scheduled with Jearld Fenton, RDN, CDE for individualized DM education.  - I have approached patient with the following individualized plan to manage diabetes and patient agrees:   -Based on her presentation with high a1c, she would need basal/bolus insulin right away, however, she can not reliably use insulin and she has poor social support. -To start , i advised her to start  strict monitoring of glucose  AC and HS and present  in 1 week. She will bring her Husband , who she says will help her do insulin therapy. Her best option is placement in skilled care facility.  -Patient is  encouraged to call clinic for blood glucose levels less than 70 or above 300 mg /dl. - I will continue  Tradjenta 5mg  po qday, therapeutically suitable for patient.. - I will discontinue  Glimepiride, risk outweighs benefit for this patient. -Patient is not a candidate for MTF, Incretin therapy.   - Patient will be considered for incretin therapy as appropriate next visit. - Patient specific target  A1c;  LDL, HDL, Triglycerides, and  Waist Circumference were discussed in detail.  2) BP/HTN: Controlled. Continue current medications including ACEI/ARB. 3) Lipids/HPL:  continue statins. 4)  Weight/Diet: CDE Consult will be initiated, she has limited ability to exercise. 5)  Hypothyroidism, unspecified hypothyroidism type - continue LT$ 75 mcg po qam.  5) Chronic Care/Health Maintenance:  -Patient is  on ACEI/ARB and Statin medications and encouraged to continue to follow up with Ophthalmology, Podiatrist at least yearly or according to recommendations, and advised to   stay away from smoking. I have recommended yearly flu vaccine and pneumonia vaccination at least every 5 years; moderate intensity exercise for up to 150 minutes weekly; and  sleep for at least 7 hours a day.  - 60 minutes of time was spent on the care of this patient , 50% of which was applied for counseling on diabetes complications and their preventions.  - Patient to bring meter and  blood glucose logs during their next visit in 1 week.   - I advised patient to maintain close follow up with Chevis Pretty, FNP for primary care needs.  Follow up plan: - Return in about 1 week (around 05/08/2015) for diabetes, underactive thyroid, high blood pressure, follow up with meter and logs- no labs, high cholesterol.  Glade Lloyd, MD Phone: [phone]  Fax: [phone]   05/01/2015, 10:35 PM

## 2015-05-01 NOTE — Telephone Encounter (Signed)
Routing to Dr Nida 

## 2015-05-02 DIAGNOSIS — I130 Hypertensive heart and chronic kidney disease with heart failure and stage 1 through stage 4 chronic kidney disease, or unspecified chronic kidney disease: Secondary | ICD-10-CM | POA: Diagnosis not present

## 2015-05-02 DIAGNOSIS — E11319 Type 2 diabetes mellitus with unspecified diabetic retinopathy without macular edema: Secondary | ICD-10-CM | POA: Diagnosis not present

## 2015-05-02 DIAGNOSIS — E1122 Type 2 diabetes mellitus with diabetic chronic kidney disease: Secondary | ICD-10-CM | POA: Diagnosis not present

## 2015-05-02 DIAGNOSIS — N183 Chronic kidney disease, stage 3 (moderate): Secondary | ICD-10-CM | POA: Diagnosis not present

## 2015-05-02 DIAGNOSIS — K602 Anal fissure, unspecified: Secondary | ICD-10-CM | POA: Diagnosis not present

## 2015-05-02 DIAGNOSIS — I5030 Unspecified diastolic (congestive) heart failure: Secondary | ICD-10-CM | POA: Diagnosis not present

## 2015-05-05 DIAGNOSIS — I5030 Unspecified diastolic (congestive) heart failure: Secondary | ICD-10-CM | POA: Diagnosis not present

## 2015-05-05 DIAGNOSIS — E1122 Type 2 diabetes mellitus with diabetic chronic kidney disease: Secondary | ICD-10-CM | POA: Diagnosis not present

## 2015-05-05 DIAGNOSIS — E11319 Type 2 diabetes mellitus with unspecified diabetic retinopathy without macular edema: Secondary | ICD-10-CM | POA: Diagnosis not present

## 2015-05-05 DIAGNOSIS — K602 Anal fissure, unspecified: Secondary | ICD-10-CM | POA: Diagnosis not present

## 2015-05-05 DIAGNOSIS — I130 Hypertensive heart and chronic kidney disease with heart failure and stage 1 through stage 4 chronic kidney disease, or unspecified chronic kidney disease: Secondary | ICD-10-CM | POA: Diagnosis not present

## 2015-05-05 DIAGNOSIS — N183 Chronic kidney disease, stage 3 (moderate): Secondary | ICD-10-CM | POA: Diagnosis not present

## 2015-05-07 DIAGNOSIS — E11319 Type 2 diabetes mellitus with unspecified diabetic retinopathy without macular edema: Secondary | ICD-10-CM | POA: Diagnosis not present

## 2015-05-07 DIAGNOSIS — N183 Chronic kidney disease, stage 3 (moderate): Secondary | ICD-10-CM | POA: Diagnosis not present

## 2015-05-07 DIAGNOSIS — E1122 Type 2 diabetes mellitus with diabetic chronic kidney disease: Secondary | ICD-10-CM | POA: Diagnosis not present

## 2015-05-07 DIAGNOSIS — I130 Hypertensive heart and chronic kidney disease with heart failure and stage 1 through stage 4 chronic kidney disease, or unspecified chronic kidney disease: Secondary | ICD-10-CM | POA: Diagnosis not present

## 2015-05-07 DIAGNOSIS — I5030 Unspecified diastolic (congestive) heart failure: Secondary | ICD-10-CM | POA: Diagnosis not present

## 2015-05-07 DIAGNOSIS — K602 Anal fissure, unspecified: Secondary | ICD-10-CM | POA: Diagnosis not present

## 2015-05-09 ENCOUNTER — Other Ambulatory Visit: Payer: Self-pay | Admitting: Nurse Practitioner

## 2015-05-09 DIAGNOSIS — E1122 Type 2 diabetes mellitus with diabetic chronic kidney disease: Secondary | ICD-10-CM | POA: Diagnosis not present

## 2015-05-09 DIAGNOSIS — I5030 Unspecified diastolic (congestive) heart failure: Secondary | ICD-10-CM | POA: Diagnosis not present

## 2015-05-09 DIAGNOSIS — I130 Hypertensive heart and chronic kidney disease with heart failure and stage 1 through stage 4 chronic kidney disease, or unspecified chronic kidney disease: Secondary | ICD-10-CM | POA: Diagnosis not present

## 2015-05-09 DIAGNOSIS — N183 Chronic kidney disease, stage 3 (moderate): Secondary | ICD-10-CM | POA: Diagnosis not present

## 2015-05-09 DIAGNOSIS — E11319 Type 2 diabetes mellitus with unspecified diabetic retinopathy without macular edema: Secondary | ICD-10-CM | POA: Diagnosis not present

## 2015-05-09 DIAGNOSIS — K602 Anal fissure, unspecified: Secondary | ICD-10-CM | POA: Diagnosis not present

## 2015-05-11 DIAGNOSIS — I130 Hypertensive heart and chronic kidney disease with heart failure and stage 1 through stage 4 chronic kidney disease, or unspecified chronic kidney disease: Secondary | ICD-10-CM | POA: Diagnosis not present

## 2015-05-11 DIAGNOSIS — R234 Changes in skin texture: Secondary | ICD-10-CM | POA: Diagnosis not present

## 2015-05-11 DIAGNOSIS — E1122 Type 2 diabetes mellitus with diabetic chronic kidney disease: Secondary | ICD-10-CM | POA: Diagnosis not present

## 2015-05-11 DIAGNOSIS — K604 Rectal fistula: Secondary | ICD-10-CM | POA: Diagnosis not present

## 2015-05-11 DIAGNOSIS — N183 Chronic kidney disease, stage 3 (moderate): Secondary | ICD-10-CM | POA: Diagnosis not present

## 2015-05-11 DIAGNOSIS — I5030 Unspecified diastolic (congestive) heart failure: Secondary | ICD-10-CM | POA: Diagnosis not present

## 2015-05-12 DIAGNOSIS — R234 Changes in skin texture: Secondary | ICD-10-CM | POA: Diagnosis not present

## 2015-05-12 DIAGNOSIS — I5030 Unspecified diastolic (congestive) heart failure: Secondary | ICD-10-CM | POA: Diagnosis not present

## 2015-05-12 DIAGNOSIS — I130 Hypertensive heart and chronic kidney disease with heart failure and stage 1 through stage 4 chronic kidney disease, or unspecified chronic kidney disease: Secondary | ICD-10-CM | POA: Diagnosis not present

## 2015-05-12 DIAGNOSIS — E1122 Type 2 diabetes mellitus with diabetic chronic kidney disease: Secondary | ICD-10-CM | POA: Diagnosis not present

## 2015-05-12 DIAGNOSIS — N183 Chronic kidney disease, stage 3 (moderate): Secondary | ICD-10-CM | POA: Diagnosis not present

## 2015-05-12 DIAGNOSIS — K604 Rectal fistula: Secondary | ICD-10-CM | POA: Diagnosis not present

## 2015-05-13 ENCOUNTER — Other Ambulatory Visit (HOSPITAL_COMMUNITY): Payer: Medicare Other

## 2015-05-13 ENCOUNTER — Telehealth: Payer: Self-pay | Admitting: Nurse Practitioner

## 2015-05-13 DIAGNOSIS — M5416 Radiculopathy, lumbar region: Secondary | ICD-10-CM | POA: Diagnosis not present

## 2015-05-13 NOTE — Telephone Encounter (Signed)
FYI Per Shirlean Mylar at [facility], pt needs to see neurologist for leg weakness and Dr Rolena Infante wants to refer her to neurology They will take care of referral

## 2015-05-13 NOTE — Telephone Encounter (Signed)
Please call and find out what this is about

## 2015-05-14 ENCOUNTER — Encounter: Payer: Self-pay | Admitting: "Endocrinology

## 2015-05-14 ENCOUNTER — Ambulatory Visit (INDEPENDENT_AMBULATORY_CARE_PROVIDER_SITE_OTHER): Payer: Medicare Other | Admitting: "Endocrinology

## 2015-05-14 VITALS — BP 143/84 | HR 71

## 2015-05-14 DIAGNOSIS — N184 Chronic kidney disease, stage 4 (severe): Secondary | ICD-10-CM

## 2015-05-14 DIAGNOSIS — I5030 Unspecified diastolic (congestive) heart failure: Secondary | ICD-10-CM | POA: Diagnosis not present

## 2015-05-14 DIAGNOSIS — K604 Rectal fistula: Secondary | ICD-10-CM | POA: Diagnosis not present

## 2015-05-14 DIAGNOSIS — I10 Essential (primary) hypertension: Secondary | ICD-10-CM | POA: Diagnosis not present

## 2015-05-14 DIAGNOSIS — E039 Hypothyroidism, unspecified: Secondary | ICD-10-CM

## 2015-05-14 DIAGNOSIS — E1122 Type 2 diabetes mellitus with diabetic chronic kidney disease: Secondary | ICD-10-CM

## 2015-05-14 DIAGNOSIS — I130 Hypertensive heart and chronic kidney disease with heart failure and stage 1 through stage 4 chronic kidney disease, or unspecified chronic kidney disease: Secondary | ICD-10-CM | POA: Diagnosis not present

## 2015-05-14 DIAGNOSIS — N183 Chronic kidney disease, stage 3 (moderate): Secondary | ICD-10-CM | POA: Diagnosis not present

## 2015-05-14 DIAGNOSIS — R234 Changes in skin texture: Secondary | ICD-10-CM | POA: Diagnosis not present

## 2015-05-14 MED ORDER — INSULIN PEN NEEDLE 31G X 8 MM MISC: 1.0000 | Status: DC

## 2015-05-14 MED ORDER — INSULIN ASPART PROT & ASPART (70-30 MIX) 100 UNIT/ML PEN: 15.0000 [IU] | PEN_INJECTOR | Freq: Two times a day (BID) | SUBCUTANEOUS | Status: DC

## 2015-05-14 NOTE — Progress Notes (Signed)
Subjective:    Patient ID: [name], female    DOB: [birth year]. Patient is to follow-up for uncontrolled type 2 diabetes with her meter and logs.  Past Medical History  Diagnosis Date  . Hypertension   . Hyperlipidemia   . GERD (gastroesophageal reflux disease)   . GAD (generalized anxiety disorder)   . Hypothyroidism   . Type II diabetes mellitus ([place])   . Right bundle branch block   . LAFB (left anterior fascicular block)   . History of rectal abscess     12-29-2004  bedside I & D  . History of GI bleed     upper 2009  due to esophagitis  &  2002  due to Mallory-Weiss tear  . History of colon polyps     benign  . Diverticulosis of colon   . History of esophagitis   . Diabetic gastroparesis ([place])   . History of gout     in issues for several years  . Sacral decubitus ulcer     since 2014- 01-02-15 remains with wound" gauze dressing changes daily.  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 Coronary artery disease   . CKD (chronic kidney disease), stage III     nephrologist--  dr Mercy Moore  . Diabetic retinopathy ([place])     bilateral --  monitored by dr [name]  . Arthritis     knees and hand/fingers. "broke back"-being evaluated for this"weakness left leg"  . Anemia in chronic renal disease     Aranesp injection --  when Hg <11, last injection 12-24-14.  Marland Kitchen Anxiety   . Constipation   . Complication of anesthesia     post-op confusion    Past Surgical History  Procedure Laterality Date  . Retinopathy surgery Bilateral 1980's?  . Orif femur fracture Left 10/09/2012    Procedure: OPEN REDUCTION INTERNAL FIXATION (ORIF) DISTAL FEMUR FRACTURE;  Surgeon: Rozanna Box, MD;  Location: [facility];  Service: Orthopedics;  Laterality: Left;  . Compression hip screw Right 05/18/2014    Procedure: COMPRESSION HIP;  Surgeon: Carole Civil, MD;  Location: [facility];  Service: Orthopedics;  Laterality: Right;  . Esophagogastroduodenoscopy  last one  01-09-2011  . Transthoracic echocardiogram  02-18-2011    mild LVH,  ef 55-60%,  grade I diastolic dysfunction/  mild TR/  RV systolic pressure increased consistant with moderate pulmonary hypertension  . Cardiovascular stress test  12-30-2004    normal perfusion study/  no ischemia or infartion/  normal LV wall function and wall motion , ef 66%  . Colonoscopy w/ polypectomy  last one 2008  . Cataract extraction w/ intraocular lens  implant, bilateral  1995  . Incision and drainage of wound N/A 09/30/2014    Procedure: IRRIGATION AND DEBRIDEMENT SACRAL WOUND, EXCISION OF PERIRECTAL TRACT WITH PLACEMENT OF ACCELL;  Surgeon: Theodoro Kos, DO;  Location: [facility];  Service: Plastics;  Laterality: N/A;  . Evaluation under anesthesia with fistulectomy N/A 01/06/2015    Procedure: EXAM UNDER ANESTHESIA , placement of seton;  Surgeon: Jackolyn Confer, MD;  Location: [facility];  Service: General;  Laterality: N/A;  . I&d extremity N/A 04/07/2015    Procedure: IRRIGATION AND DEBRIDEMENT ISCHIAL ULCER;  Surgeon: Loel Lofty Dillingham, DO;  Location: [facility];  Service: Plastics;  Laterality: N/A;  . Application of a-cell of extremity N/A 04/07/2015    Procedure: A CELL PLACMENT;  Surgeon: Loel Lofty Dillingham, DO;  Location: [facility];  Service: Plastics;  Laterality: N/A;   Social History   Social History  . Marital Status: Married    Spouse Name: N/A  . Number of Children: 0  . Years of Education: N/A   Occupational History  .     Social History Main Topics  . Smoking status: Never Smoker   . Smokeless tobacco: Never Used  . Alcohol Use: No  . Drug Use: No  . Sexual Activity: Not Asked   Other Topics Concern  . None   Social History Narrative   Outpatient Encounter Prescriptions as of 05/14/2015  Medication Sig  . atorvastatin (LIPITOR) 40 MG tablet Take 1 tablet (40 mg total) by mouth every evening.  . calcitRIOL (ROCALTROL) 0.25 MCG capsule TAKE 1 CAPSULE ONE TIME DAILY  .  cloNIDine (CATAPRES) 0.1 MG tablet Take 1 tablet (0.1 mg total) by mouth 2 (two) times daily.  . furosemide (LASIX) 40 MG tablet Take 1 tablet (40 mg total) by mouth every morning.  Marland Kitchen glucose blood (ONETOUCH VERIO) test strip Test 1 time per day and prn  E11.9  . hydrALAZINE (APRESOLINE) 50 MG tablet Take 1 tablet (50 mg total) by mouth every 8 (eight) hours.  Marland Kitchen HYDROcodone-acetaminophen (NORCO) 5-325 MG tablet Take 1 tablet by mouth every 6 (six) hours as needed for moderate pain.  Marland Kitchen insulin aspart protamine - aspart (NOVOLOG MIX 70/30 FLEXPEN) (70-30) 100 UNIT/ML FlexPen Inject 0.15 mLs (15 Units total) into the skin 2 (two) times daily.  . Insulin Pen Needle (B-D ULTRAFINE III SHORT PEN) 31G X 8 MM MISC 1 each by Does not apply route as directed.  Marland Kitchen levothyroxine (SYNTHROID, LEVOTHROID) 75 MCG tablet Take 1 tablet (75 mcg total) by mouth daily before breakfast.  . lidocaine (XYLOCAINE) 5 % ointment Apply small amount (3 to 5 grams)  to affected area(s) two times a day  . linagliptin (TRADJENTA) 5 MG TABS tablet Take 1 tablet (5 mg total) by mouth daily.  Marland Kitchen losartan (COZAAR) 100 MG tablet Take 1 tablet (100 mg total) by mouth every morning.  . metoprolol succinate (TOPROL-XL) 50 MG 24 hr tablet Take 1 tablet (50 mg total) by mouth every morning.  . ondansetron (ZOFRAN) 4 MG tablet Take 1 tablet (4 mg total) by mouth every 8 (eight) hours as needed for nausea or vomiting.  Glory Rosebush DELICA LANCETS [id number] MISC Test one time per day and prn  . pantoprazole (PROTONIX) 40 MG tablet Take 1 tablet (40 mg total) by mouth 2 (two) times daily.  . potassium chloride SA (K-DUR,KLOR-CON) 20 MEQ tablet Take 1 tablet (20 mEq total) by mouth 2 (two) times daily.  . [DISCONTINUED] insulin aspart protamine - aspart (NOVOLOG MIX 70/30 FLEXPEN) (70-30) 100 UNIT/ML FlexPen Inject 0.15 mLs (15 Units total) into the skin 2 (two) times daily.  . [DISCONTINUED] Insulin Pen Needle (B-D ULTRAFINE III SHORT PEN) 31G X 8 MM  MISC 1 each by Does not apply route as directed.   No facility-administered encounter medications on file as of 05/14/2015.   ALLERGIES: Allergies  Allergen Reactions  . Amoxicillin   . Aspirin Nausea And Vomiting  . Ciprofloxacin Other (See Comments)    Upset Stomach  . Codeine Nausea And Vomiting    Makes me sick   . Micardis [Telmisartan] Other (See Comments)    unknown  . Nexium [Esomeprazole Magnesium]   . Niaspan [Niacin Er]   . Onglyza [Saxagliptin]   . Other     No  otc pain medications  . Rofecoxib Other (See Comments)    unknown  . Simvastatin   . Tequin [Gatifloxacin]   . Welchol [Colesevelam Hcl]    VACCINATION STATUS: Immunization History  Administered Date(s) Administered  . Influenza,inj,Quad PF,36+ Mos 02/16/2013, 02/27/2015  . Pneumococcal Conjugate-13 10/11/2014  . Pneumococcal Polysaccharide-23 10/10/2012  . Tdap 10/06/2012    Diabetes She presents for her follow-up diabetic visit. She has type 2 diabetes mellitus. Onset time: She was diagnosed at approximate age of 40 years . Her disease course has been worsening. There are no hypoglycemic associated symptoms. Pertinent negatives for hypoglycemia include no confusion, headaches, pallor or seizures. Associated symptoms include fatigue, polydipsia, polyuria and visual change. Pertinent negatives for diabetes include no chest pain and no polyphagia. There are no hypoglycemic complications. Symptoms are worsening. Diabetic complications include nephropathy, peripheral neuropathy, PVD and retinopathy. Risk factors for coronary artery disease include diabetes mellitus, dyslipidemia, hypertension and sedentary lifestyle. Current diabetic treatment includes oral agent (monotherapy). She is compliant with treatment most of the time. Weight trend: Wheelchair-bound. She is following a generally unhealthy diet. When asked about meal planning, she reported none. She has not had a previous visit with a dietitian. She never  participates in exercise. Her home blood glucose trend is increasing steadily. Her breakfast blood glucose range is generally >200 mg/dl. Her lunch blood glucose range is generally >200 mg/dl. Her dinner blood glucose range is generally >200 mg/dl. Her overall blood glucose range is >200 mg/dl. An ACE inhibitor/angiotensin II receptor blocker is being taken. Eye exam is current.  Thyroid Problem Presents for initial visit. Onset time: 15 years. Symptoms include fatigue and visual change. Patient reports no cold intolerance, diarrhea, heat intolerance or palpitations. The symptoms have been stable. Past treatments include levothyroxine. The following procedures have not been performed: thyroidectomy.  Hypertension This is a chronic problem. The current episode started more than 1 year ago. Pertinent negatives include no chest pain, headaches, palpitations or shortness of breath. Past treatments include angiotensin blockers. Hypertensive end-organ damage includes PVD, retinopathy and a thyroid problem.      Review of Systems  Constitutional: Positive for fatigue. Negative for unexpected weight change.  HENT: Negative for trouble swallowing and voice change.   Eyes: Negative for visual disturbance.  Respiratory: Negative for cough, shortness of breath and wheezing.   Cardiovascular: Negative for chest pain, palpitations and leg swelling.  Gastrointestinal: Negative for nausea, vomiting and diarrhea.  Endocrine: Positive for polydipsia and polyuria. Negative for cold intolerance, heat intolerance and polyphagia.  Musculoskeletal: Positive for myalgias, back pain, joint swelling, arthralgias and gait problem.       She is wheelchair-bound due to recent hip fracture and diffuse debilitating arthritis.  Skin: Negative for color change, pallor, rash and wound.  Neurological: Negative for seizures and headaches.  Psychiatric/Behavioral: Negative for suicidal ideas and confusion.    Objective:    BP  143/84 mmHg  Pulse 71  SpO2 98%  Wt Readings from Last 3 Encounters:  05/01/15 153 lb 6.4 oz (69.582 kg)  04/07/15 160 lb (72.576 kg)  04/02/15 160 lb (72.576 kg)    Physical Exam  Constitutional: She is oriented to person, place, and time. She appears well-developed.  HENT:  Head: Normocephalic and atraumatic.  Eyes: EOM are normal.  Neck: Normal range of motion. Neck supple. No tracheal deviation present. No thyromegaly present.  Cardiovascular: Normal rate and regular rhythm.  Exam reveals decreased pulses.   Pulses:      Dorsalis pedis  pulses are 0 on the right side, and 0 on the left side.       Posterior tibial pulses are 0 on the right side, and 0 on the left side.  Pulmonary/Chest: Effort normal and breath sounds normal.  Abdominal: Soft. Bowel sounds are normal. There is no tenderness. There is no guarding.  Musculoskeletal: She exhibits no edema.       Arms: She is wheelchair-bound due to recent hip fracture and diffuse debilitating arthritis.  Neurological: She is alert and oriented to person, place, and time. She has normal reflexes. A sensory deficit is present. No cranial nerve deficit. Coordination normal.  Skin: Skin is warm and dry. No rash noted. No erythema. No pallor.  Psychiatric: She has a normal mood and affect. Judgment normal.    Results for orders placed or performed during the hospital encounter of 04/29/15  Hemoglobin and hematocrit, blood  Result Value Ref Range   Hemoglobin 12.0 12.0 - 15.0 g/dL   HCT 36.2 36.0 - 46.0 %  Iron and TIBC  Result Value Ref Range   Iron 75 28 - 170 ug/dL   TIBC 258 250 - 450 ug/dL   Saturation Ratios 29 10.4 - 31.8 %   UIBC 183 ug/dL  Ferritin  Result Value Ref Range   Ferritin 498 (H) 11 - 307 ng/mL   Complete Blood Count (Most recent): Lab Results  Component Value Date   WBC 10.0 04/02/2015   HGB 12.0 04/29/2015   HCT 36.2 04/29/2015   MCV 91.9 04/02/2015   PLT 192 04/02/2015   Chemistry (most  recent): Lab Results  Component Value Date   NA 135 04/02/2015   K 5.3* 04/02/2015   CL 105 04/02/2015   CO2 21* 04/02/2015   BUN 32* 04/02/2015   CREATININE 1.85* 04/02/2015   Diabetic Labs (most recent): Lab Results  Component Value Date   HGBA1C 10.0* 04/02/2015   HGBA1C 6.9* 01/24/2015   HGBA1C 8.8* 08/26/2014   Lipid profile (most recent): Lab Results  Component Value Date   TRIG 170* 10/11/2014   CHOL 153 10/11/2014     Assessment & Plan:   1. Type 2 diabetes mellitus with stage 4 chronic kidney disease, without long-term current use of insulin ([place])   - Patient has currently uncontrolled symptomatic type 2 DM since  69 years of age,  with most recent A1c of  10 %. Recent labs reviewed.   -her  diabetes is complicated by PAD, CKD, neuropathy and patient remains at a high risk for more acute and chronic complications of diabetes which include CAD, CVA, CKD, retinopathy, and neuropathy. These are all discussed in detail with the patient.  - I have counseled the patient on diet management  by adopting a carbohydrate restricted/protein rich diet.  - Suggestion is made for patient to avoid simple carbohydrates   from their diet including Cakes , Desserts, Ice Cream,  Soda (  diet and regular) , Sweet Tea , Candies,  Chips, Cookies, Artificial Sweeteners,   and "Sugar-free" Products . This will help patient to have stable blood glucose profile and potentially avoid unintended weight gain.  - I encouraged the patient to switch to  unprocessed or minimally processed complex starch and increased protein intake (animal or plant source), fruits, and vegetables.  - Patient is advised to stick to a routine mealtimes to eat 3 meals  a day and avoid unnecessary snacks ( to snack only to correct hypoglycemia).  - The patient will be scheduled  with Jearld Fenton, RDN, CDE for individualized DM education.  - I have approached patient with the following individualized plan to manage  diabetes and patient agrees:   -Based on her persistent hyperglycemia and higher A1c of 10% she will need insulin therapy.  - she would need basal/bolus insulin right away, however, she can not reliably use insulin and she has poor social support. -Her husband offers to help. I will initiate NovoLog 70/30 15 units with breakfast and 15 units with supper for pre-meal glucose above 90 mg/dL associated with monitoring of blood glucose at least twice a day before breakfast and before supper. -If she cannot perform this therapy even with the help of her husband, her best option is placement in skilled care facility.  -Patient is encouraged to call clinic for blood glucose levels less than 70 or above 300 mg /dl. - I will continue  Tradjenta 5mg  po qday, therapeutically suitable for patient. -Patient is not a candidate for MTF, Incretin therapy.   - Patient will be considered for incretin therapy as appropriate next visit. - Patient specific target  A1c;  LDL, HDL, Triglycerides, and  Waist Circumference were discussed in detail.  2) BP/HTN: Controlled. Continue current medications including ACEI/ARB. 3) Lipids/HPL:  continue statins. 4)  Weight/Diet: CDE Consult will be initiated, she has limited ability to exercise. 5)  Hypothyroidism: She is clinically euthyroid - continue LT4 75 mcg po qam.  - We discussed about correct intake of levothyroxine, at fasting, with water, separated by at least 30 minutes from breakfast, and separated by more than 4 hours from calcium, iron, multivitamins, acid reflux medications (PPIs). -Patient is made aware of the fact that thyroid hormone replacement is needed for life, dose to be adjusted by periodic monitoring of thyroid function tests.  5) Chronic Care/Health Maintenance:  -Patient is  on ACEI/ARB and Statin medications and encouraged to continue to follow up with Ophthalmology, Podiatrist at least yearly or according to recommendations, and advised to    stay away from smoking. I have recommended yearly flu vaccine and pneumonia vaccination at least every 5 years; moderate intensity exercise for up to 150 minutes weekly; and  sleep for at least 7 hours a day.  - 30 minutes of time was spent on the care of this patient , 50% of which was applied for counseling on diabetes complications and their preventions.  - Patient to bring meter and  blood glucose logs during their next visit in 2 weeks.   - I advised patient to maintain close follow up with Chevis Pretty, FNP for primary care needs.  Follow up plan: - Return in about 2 weeks (around 05/28/2015) for diabetes, high blood pressure, high cholesterol, follow up with meter and logs- no labs.  Glade Lloyd, MD Phone: [phone]  Fax: [phone]   05/14/2015, 2:17 PM

## 2015-05-14 NOTE — Patient Instructions (Signed)

## 2015-05-15 ENCOUNTER — Encounter (HOSPITAL_COMMUNITY)
Admission: RE | Admit: 2015-05-15 | Discharge: 2015-05-15 | Disposition: A | Discharge status: Home / Self Care | Payer: Medicare Other | Source: Ambulatory Visit | Attending: Nephrology | Admitting: Nephrology

## 2015-05-15 ENCOUNTER — Emergency Department (HOSPITAL_COMMUNITY)
Admission: EM | Admit: 2015-05-15 | Discharge: 2015-05-16 | Disposition: A | Discharge status: Home / Self Care | Payer: Medicare Other | Attending: Emergency Medicine | Admitting: Emergency Medicine

## 2015-05-15 ENCOUNTER — Encounter (HOSPITAL_COMMUNITY): Payer: Self-pay

## 2015-05-15 DIAGNOSIS — M1990 Unspecified osteoarthritis, unspecified site: Secondary | ICD-10-CM | POA: Diagnosis not present

## 2015-05-15 DIAGNOSIS — F4489 Other dissociative and conversion disorders: Secondary | ICD-10-CM | POA: Diagnosis not present

## 2015-05-15 DIAGNOSIS — Z79899 Other long term (current) drug therapy: Secondary | ICD-10-CM | POA: Diagnosis not present

## 2015-05-15 DIAGNOSIS — L03114 Cellulitis of left upper limb: Secondary | ICD-10-CM | POA: Diagnosis not present

## 2015-05-15 DIAGNOSIS — E785 Hyperlipidemia, unspecified: Secondary | ICD-10-CM | POA: Insufficient documentation

## 2015-05-15 DIAGNOSIS — Z880 Allergy status to penicillin: Secondary | ICD-10-CM | POA: Diagnosis not present

## 2015-05-15 DIAGNOSIS — K219 Gastro-esophageal reflux disease without esophagitis: Secondary | ICD-10-CM | POA: Diagnosis not present

## 2015-05-15 DIAGNOSIS — F419 Anxiety disorder, unspecified: Secondary | ICD-10-CM | POA: Diagnosis not present

## 2015-05-15 DIAGNOSIS — L089 Local infection of the skin and subcutaneous tissue, unspecified: Secondary | ICD-10-CM | POA: Insufficient documentation

## 2015-05-15 DIAGNOSIS — Z8659 Personal history of other mental and behavioral disorders: Secondary | ICD-10-CM | POA: Diagnosis not present

## 2015-05-15 DIAGNOSIS — L03012 Cellulitis of left finger: Secondary | ICD-10-CM | POA: Insufficient documentation

## 2015-05-15 DIAGNOSIS — E039 Hypothyroidism, unspecified: Secondary | ICD-10-CM | POA: Insufficient documentation

## 2015-05-15 DIAGNOSIS — Z86010 Personal history of colonic polyps: Secondary | ICD-10-CM | POA: Insufficient documentation

## 2015-05-15 DIAGNOSIS — N183 Chronic kidney disease, stage 3 (moderate): Secondary | ICD-10-CM | POA: Diagnosis not present

## 2015-05-15 DIAGNOSIS — I2510 Atherosclerotic heart disease of native coronary artery without angina pectoris: Secondary | ICD-10-CM | POA: Diagnosis not present

## 2015-05-15 DIAGNOSIS — L03019 Cellulitis of unspecified finger: Secondary | ICD-10-CM

## 2015-05-15 DIAGNOSIS — R404 Transient alteration of awareness: Secondary | ICD-10-CM | POA: Diagnosis not present

## 2015-05-15 DIAGNOSIS — L03113 Cellulitis of right upper limb: Secondary | ICD-10-CM | POA: Diagnosis not present

## 2015-05-15 DIAGNOSIS — L03011 Cellulitis of right finger: Secondary | ICD-10-CM | POA: Insufficient documentation

## 2015-05-15 DIAGNOSIS — Z794 Long term (current) use of insulin: Secondary | ICD-10-CM | POA: Diagnosis not present

## 2015-05-15 DIAGNOSIS — R7309 Other abnormal glucose: Secondary | ICD-10-CM | POA: Diagnosis not present

## 2015-05-15 DIAGNOSIS — E11649 Type 2 diabetes mellitus with hypoglycemia without coma: Secondary | ICD-10-CM | POA: Diagnosis not present

## 2015-05-15 DIAGNOSIS — I129 Hypertensive chronic kidney disease with stage 1 through stage 4 chronic kidney disease, or unspecified chronic kidney disease: Secondary | ICD-10-CM | POA: Diagnosis not present

## 2015-05-15 DIAGNOSIS — E162 Hypoglycemia, unspecified: Secondary | ICD-10-CM

## 2015-05-15 DIAGNOSIS — R410 Disorientation, unspecified: Secondary | ICD-10-CM | POA: Insufficient documentation

## 2015-05-15 LAB — BASIC METABOLIC PANEL
Anion gap: 9 (ref 5–15)
BUN: 43 mg/dL — AB (ref 6–20)
CALCIUM: 9.5 mg/dL (ref 8.9–10.3)
CO2: 19 mmol/L — ABNORMAL LOW (ref 22–32)
CREATININE: 1.69 mg/dL — AB (ref 0.44–1.00)
Chloride: 109 mmol/L (ref 101–111)
GFR calc non Af Amer: 30 mL/min — ABNORMAL LOW (ref >60)
GFR, EST AFRICAN AMERICAN: 35 mL/min — AB (ref >60)
Glucose, Bld: 101 mg/dL — ABNORMAL HIGH (ref 65–99)
Potassium: 4.2 mmol/L (ref 3.5–5.1)
SODIUM: 137 mmol/L (ref 135–145)

## 2015-05-15 LAB — CBC WITH DIFFERENTIAL/PLATELET
Basophils Absolute: 0 10*3/uL (ref 0.0–0.1)
Basophils Relative: 0 %
EOS ABS: 0.1 10*3/uL (ref 0.0–0.7)
EOS PCT: 1 %
HEMATOCRIT: 37.3 % (ref 36.0–46.0)
HEMOGLOBIN: 12.3 g/dL (ref 12.0–15.0)
LYMPHS ABS: 1.3 10*3/uL (ref 0.7–4.0)
Lymphocytes Relative: 13 %
MCH: 30 pg (ref 26.0–34.0)
MCHC: 33 g/dL (ref 30.0–36.0)
MCV: 91 fL (ref 78.0–100.0)
MONOS PCT: 6 %
Monocytes Absolute: 0.7 10*3/uL (ref 0.1–1.0)
NEUTROS PCT: 80 %
Neutro Abs: 8.4 10*3/uL — ABNORMAL HIGH (ref 1.7–7.7)
Platelets: 189 10*3/uL (ref 150–400)
RBC: 4.1 MIL/uL (ref 3.87–5.11)
RDW: 13.8 % (ref 11.5–15.5)
WBC: 10.6 10*3/uL — ABNORMAL HIGH (ref 4.0–10.5)

## 2015-05-15 LAB — CBG MONITORING, ED
GLUCOSE-CAPILLARY: 107 mg/dL — AB (ref 65–99)
GLUCOSE-CAPILLARY: 163 mg/dL — AB (ref 65–99)
GLUCOSE-CAPILLARY: 94 mg/dL (ref 65–99)
Glucose-Capillary: 199 mg/dL — ABNORMAL HIGH (ref 65–99)
Glucose-Capillary: 72 mg/dL (ref 65–99)
Glucose-Capillary: 70 mg/dL (ref 65–99)

## 2015-05-15 LAB — URINALYSIS, ROUTINE W REFLEX MICROSCOPIC
BILIRUBIN URINE: NEGATIVE
Glucose, UA: NEGATIVE mg/dL
KETONES UR: NEGATIVE mg/dL
NITRITE: NEGATIVE
PH: 5.5 (ref 5.0–8.0)
Protein, ur: NEGATIVE mg/dL
Specific Gravity, Urine: 1.025 (ref 1.005–1.030)

## 2015-05-15 LAB — HEMOGLOBIN AND HEMATOCRIT, BLOOD
HCT: 37.3 % (ref 36.0–46.0)
Hemoglobin: 12.2 g/dL (ref 12.0–15.0)

## 2015-05-15 LAB — URINE MICROSCOPIC-ADD ON

## 2015-05-15 MED ORDER — DEXTROSE 50 % IV SOLN
25.0000 mL | Freq: Once | INTRAVENOUS | Status: AC
  Administered 2015-05-15: 25 mL via INTRAVENOUS

## 2015-05-15 MED ORDER — DEXTROSE 5 % IV SOLN
1.0000 g | Freq: Once | INTRAVENOUS | Status: AC
  Administered 2015-05-15: 1 g via INTRAVENOUS
  Filled 2015-05-15: qty 10

## 2015-05-15 MED ORDER — CLINDAMYCIN HCL 150 MG PO CAPS: 300.0000 mg | ORAL_CAPSULE | Freq: Three times a day (TID) | ORAL | Status: DC

## 2015-05-15 MED ORDER — DEXTROSE 50 % IV SOLN
INTRAVENOUS | Status: AC
  Administered 2015-05-15: 50 mL via INTRAVENOUS
  Filled 2015-05-15: qty 50

## 2015-05-15 MED ORDER — DEXTROSE 50 % IV SOLN
INTRAVENOUS | Status: AC
  Filled 2015-05-15: qty 50

## 2015-05-15 NOTE — ED Notes (Signed)
Pt's CBG resulted 70. MD Rancour notified.

## 2015-05-15 NOTE — Progress Notes (Signed)
Results for DARRIELL, DEMORY (MRN [id number]) as of 05/15/2015 09:49  Ref. Range 05/15/2015 09:30  Hemoglobin Latest Ref Range: 12.0-15.0 g/dL 12.2  HCT Latest Ref Range: 36.0-46.0 % 37.3

## 2015-05-15 NOTE — ED Notes (Signed)
Altered loc at home pre husband with "low blood sugar"  Given oral glucose with some improvement.  Pt was also given d50 en route by ems with improvement up to 229/

## 2015-05-15 NOTE — ED Notes (Signed)
Patient states that she is not able to void at this time.

## 2015-05-15 NOTE — ED Provider Notes (Signed)
CSN: [id number]     Arrival date & time 05/15/15  1929 History   First MD Initiated Contact with Patient 05/15/15 1957     Chief Complaint  Patient presents with  . Hypoglycemia     (Consider location/radiation/quality/duration/timing/severity/associated sxs/prior Treatment) Patient is a 69 y.o. female presenting with hypoglycemia.  Hypoglycemia  patient presents with hypoglycemia. Recently started insulin has had a second dose. She was given insulin for sugar 146. She also then did not eat much dinner. Her sugar then dropped.she was confused with it. She is not previously been on insulin. She does have infections on both of her thumbs. She has been seeing wound care. No abdominal pain. She has not been eating that well. No fevers. Tomorrow is her 50th anniversary. Reportedly sugar was "low". And given oral glucose and then D50.  Past Medical History  Diagnosis Date  . Hypertension   . Hyperlipidemia   . GERD (gastroesophageal reflux disease)   . GAD (generalized anxiety disorder)   . Hypothyroidism   . Type II diabetes mellitus ([place])   . Right bundle branch block   . LAFB (left anterior fascicular block)   . History of rectal abscess     12-29-2004  bedside I & D  . History of GI bleed     upper 2009  due to esophagitis  &  2002  due to Mallory-Weiss tear  . History of colon polyps     benign  . Diverticulosis of colon   . History of esophagitis   . Diabetic gastroparesis ([place])   . History of gout     in issues for several years  . Sacral decubitus ulcer     since 2014- 01-02-15 remains with wound" gauze dressing changes daily.  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 Coronary artery disease   . CKD (chronic kidney disease), stage III     nephrologist--  dr Mercy Moore  . Diabetic retinopathy ([place])     bilateral --  monitored by dr [name]  . Arthritis     knees and hand/fingers. "broke back"-being evaluated for this"weakness left leg"  . Anemia in  chronic renal disease     Aranesp injection --  when Hg <11, last injection 12-24-14.  Marland Kitchen Anxiety   . Constipation   . Complication of anesthesia     post-op confusion    Past Surgical History  Procedure Laterality Date  . Retinopathy surgery Bilateral 1980's?  . Orif femur fracture Left 10/09/2012    Procedure: OPEN REDUCTION INTERNAL FIXATION (ORIF) DISTAL FEMUR FRACTURE;  Surgeon: Rozanna Box, MD;  Location: [facility];  Service: Orthopedics;  Laterality: Left;  . Compression hip screw Right 05/18/2014    Procedure: COMPRESSION HIP;  Surgeon: Carole Civil, MD;  Location: [facility];  Service: Orthopedics;  Laterality: Right;  . Esophagogastroduodenoscopy  last one 01-09-2011  . Transthoracic echocardiogram  02-18-2011    mild LVH,  ef 55-60%,  grade I diastolic dysfunction/  mild TR/  RV systolic pressure increased consistant with moderate pulmonary hypertension  . Cardiovascular stress test  12-30-2004    normal perfusion study/  no ischemia or infartion/  normal LV wall function and wall motion , ef 66%  . Colonoscopy w/ polypectomy  last one 2008  . Cataract extraction w/ intraocular lens  implant, bilateral  1995  . Incision and drainage of wound N/A 09/30/2014    Procedure: IRRIGATION AND DEBRIDEMENT SACRAL WOUND, EXCISION OF  PERIRECTAL TRACT WITH PLACEMENT OF ACCELL;  Surgeon: Theodoro Kos, DO;  Location: [place];  Service: Plastics;  Laterality: N/A;  . Evaluation under anesthesia with fistulectomy N/A 01/06/2015    Procedure: EXAM UNDER ANESTHESIA , placement of seton;  Surgeon: Jackolyn Confer, MD;  Location: [facility];  Service: General;  Laterality: N/A;  . I&d extremity N/A 04/07/2015    Procedure: IRRIGATION AND DEBRIDEMENT ISCHIAL ULCER;  Surgeon: Loel Lofty Dillingham, DO;  Location: [place] [facility];  Service: Plastics;  Laterality: N/A;  . Application of a-cell of extremity N/A 04/07/2015    Procedure: A CELL PLACMENT;  Surgeon: Loel Lofty Dillingham, DO;  Location: [place];   Service: Plastics;  Laterality: N/A;   Family History  Problem Relation Age of Onset  . Colon cancer Father   . Prostate cancer Father   . Diabetes Father   . Coronary artery disease Mother 77  . Heart disease Mother   . Diabetes Mother   . Coronary artery disease Sister 92  . Diabetes Sister   . Heart disease Sister   . Diabetes Maternal Grandmother   . Diabetes Sister   . Diabetes Sister   . Diabetes Sister   . Diabetes Sister   . Diabetes Sister    Social History  Substance Use Topics  . Smoking status: Never Smoker   . Smokeless tobacco: Never Used  . Alcohol Use: No   OB History    No data available     Review of Systems  Constitutional: Positive for appetite change.  Respiratory: Negative for chest tightness.   Gastrointestinal: Negative for abdominal pain.  Genitourinary: Negative for flank pain.  Skin: Positive for wound.  Psychiatric/Behavioral: Positive for confusion.      Allergies  Aspirin; Ciprofloxacin; Codeine; Micardis; Nexium; Niaspan; Onglyza; Other; Rofecoxib; Simvastatin; Tequin; Welchol; and Amoxicillin  Home Medications   Prior to Admission medications   Medication Sig Start Date End Date Taking? Authorizing Provider  atorvastatin (LIPITOR) 40 MG tablet Take 1 tablet (40 mg total) by mouth every evening. 02/03/15  Yes Mary-Margaret Hassell Done, FNP  calcitRIOL (ROCALTROL) 0.25 MCG capsule TAKE 1 CAPSULE ONE TIME DAILY 03/07/15  Yes Mary-Margaret Hassell Done, FNP  cloNIDine (CATAPRES) 0.1 MG tablet Take 1 tablet (0.1 mg total) by mouth 2 (two) times daily. 02/03/15  Yes Mary-Margaret Hassell Done, FNP  furosemide (LASIX) 40 MG tablet Take 1 tablet (40 mg total) by mouth every morning. 02/03/15  Yes Mary-Margaret Hassell Done, FNP  hydrALAZINE (APRESOLINE) 50 MG tablet Take 1 tablet (50 mg total) by mouth every 8 (eight) hours. 01/24/15  Yes Lezlie Octave Black, NP  insulin aspart protamine - aspart (NOVOLOG MIX 70/30 FLEXPEN) (70-30) 100 UNIT/ML FlexPen Inject 0.15 mLs (15  Units total) into the skin 2 (two) times daily. 05/14/15  Yes Cassandria Anger, MD  levothyroxine (SYNTHROID, LEVOTHROID) 75 MCG tablet Take 1 tablet (75 mcg total) by mouth daily before breakfast. 02/03/15  Yes Mary-Margaret Hassell Done, FNP  lidocaine (XYLOCAINE) 5 % ointment Apply small amount (3 to 5 grams)  to affected area(s) two times a day 05/12/15  Yes Mary-Margaret Hassell Done, FNP  linagliptin (TRADJENTA) 5 MG TABS tablet Take 1 tablet (5 mg total) by mouth daily. 05/01/15  Yes Cassandria Anger, MD  losartan (COZAAR) 100 MG tablet Take 1 tablet (100 mg total) by mouth every morning. 02/03/15  Yes Mary-Margaret Hassell Done, FNP  metoprolol succinate (TOPROL-XL) 50 MG 24 hr tablet Take 1 tablet (50 mg total) by mouth every morning. 02/03/15  Yes Mary-Margaret  Hassell Done, FNP  pantoprazole (PROTONIX) 40 MG tablet Take 1 tablet (40 mg total) by mouth 2 (two) times daily. 02/03/15  Yes Mary-Margaret Hassell Done, FNP  potassium chloride SA (K-DUR,KLOR-CON) 20 MEQ tablet Take 1 tablet (20 mEq total) by mouth 2 (two) times daily. 02/03/15  Yes Mary-Margaret Hassell Done, FNP  glucose blood (ONETOUCH VERIO) test strip Test 1 time per day and prn  E11.9 02/28/15   Mary-Margaret Hassell Done, FNP  HYDROcodone-acetaminophen (NORCO) 5-325 MG tablet Take 1 tablet by mouth every 6 (six) hours as needed for moderate pain. 04/07/15   Shawn M Rayburn, PA-C  Insulin Pen Needle (B-D ULTRAFINE III SHORT PEN) 31G X 8 MM MISC 1 each by Does not apply route as directed. 05/14/15   Cassandria Anger, MD  ondansetron (ZOFRAN) 4 MG tablet Take 1 tablet (4 mg total) by mouth every 8 (eight) hours as needed for nausea or vomiting. 02/03/15   Mary-Margaret Hassell Done, FNP  [facility] DELICA LANCETS [id number] MISC Test one time per day and prn 02/28/15   Mary-Margaret Hassell Done, FNP   BP 151/56 mmHg  Pulse 57  Resp 18  Ht 5\' 1"  (1.549 m)  Wt 140 lb (63.504 kg)  BMI 26.47 kg/m2  SpO2 100% Physical Exam  Constitutional: She appears well-developed.  HENT:  Head:  Atraumatic.  Eyes: EOM are normal.  Cardiovascular: Normal rate.   Pulmonary/Chest: Effort normal.  Abdominal: Soft. There is no tenderness.  Musculoskeletal: Normal range of motion.  Neurological: She is alert.  Skin: Skin is warm.  Infections of the pads of bilateral thumbs. Some purulent drainage. No fluctuance.    ED Course  Procedures (including critical care time) Labs Review Labs Reviewed  BASIC METABOLIC PANEL - Abnormal; Notable for the following:    CO2 19 (*)    Glucose, Bld 101 (*)    BUN 43 (*)    Creatinine, Ser 1.69 (*)    GFR calc non Af Amer 30 (*)    GFR calc Af Amer 35 (*)    All other components within normal limits  CBC WITH DIFFERENTIAL/PLATELET - Abnormal; Notable for the following:    WBC 10.6 (*)    Neutro Abs 8.4 (*)    All other components within normal limits  CBG MONITORING, ED - Abnormal; Notable for the following:    Glucose-Capillary 199 (*)    All other components within normal limits  CBG MONITORING, ED - Abnormal; Notable for the following:    Glucose-Capillary 107 (*)    All other components within normal limits  URINALYSIS, ROUTINE W REFLEX MICROSCOPIC (NOT AT [facility])  CBG MONITORING, ED  CBG MONITORING, ED    Imaging Review No results found. I have personally reviewed and evaluated these images and lab results as part of my medical decision-making.   EKG Interpretation None      MDM   Final diagnoses:  Hypoglycemia  Cellulitis of thumb, unspecified laterality    Patient with hypoglycemia. It was her  second to dose newly started 70/30 insulin.Marland Kitchen Has tolerated orals so far and her sugar has gone back to normal, but is trending down just point. No fever here. Will check more CBG to evaluate for stability. If remains within the normal range possible discharge home with antibiotics for some infections.  Davonna Belling, MD 05/15/15 2154

## 2015-05-15 NOTE — Discharge Instructions (Signed)
Discussed with Dr. Dorris Fetch about adjustment of the insulin.  Cellulitis Cellulitis is an infection of the skin and the tissue beneath it. The infected area is usually red and tender. Cellulitis occurs most often in the arms and lower legs.  CAUSES  Cellulitis is caused by bacteria that enter the skin through cracks or cuts in the skin. The most common types of bacteria that cause cellulitis are staphylococci and streptococci. SIGNS AND SYMPTOMS   Redness and warmth.  Swelling.  Tenderness or pain.  Fever. DIAGNOSIS  Your health care provider can usually determine what is wrong based on a physical exam. Blood tests may also be done. TREATMENT  Treatment usually involves taking an antibiotic medicine. HOME CARE INSTRUCTIONS   Take your antibiotic medicine as directed by your health care provider. Finish the antibiotic even if you start to feel better.  Keep the infected arm or leg elevated to reduce swelling.  Apply a warm cloth to the affected area up to 4 times per day to relieve pain.  Take medicines only as directed by your health care provider.  Keep all follow-up visits as directed by your health care provider. SEEK MEDICAL CARE IF:   You notice red streaks coming from the infected area.  Your red area gets larger or turns dark in color.  Your bone or joint underneath the infected area becomes painful after the skin has healed.  Your infection returns in the same area or another area.  You notice a swollen bump in the infected area.  You develop new symptoms.  You have a fever. SEEK IMMEDIATE MEDICAL CARE IF:   You feel very sleepy.  You develop vomiting or diarrhea.  You have a general ill feeling (malaise) with muscle aches and pains.   This information is not intended to replace advice given to you by your health care provider. Make sure you discuss any questions you have with your health care provider.   Document Released: 02/17/2005 Document Revised:  01/29/2015 Document Reviewed: 07/26/2011 Elsevier Interactive Patient Education 2016 Else[employer]  Hypoglycemia Hypoglycemia occurs when the glucose in your blood is too low. Glucose is a type of sugar that is your body's main energy source. Hormones, such as insulin and glucagon, control the level of glucose in the blood. Insulin lowers blood glucose and glucagon increases blood glucose. Having too much insulin in your blood stream, or not eating enough food containing sugar, can result in hypoglycemia. Hypoglycemia can happen to people with or without diabetes. It can develop quickly and can be a medical emergency.  CAUSES   Missing or delaying meals.  Not eating enough carbohydrates at meals.  Taking too much diabetes medicine.  Not timing your oral diabetes medicine or insulin doses with meals, snacks, and exercise.  Nausea and vomiting.  Certain medicines.  Severe illnesses, such as hepatitis, kidney disorders, and certain eating disorders.  Increased activity or exercise without eating something extra or adjusting medicines.  Drinking too much alcohol.  A nerve disorder that affects body functions like your heart rate, blood pressure, and digestion (autonomic neuropathy).  A condition where the stomach muscles do not function properly (gastroparesis). Therefore, medicines and food may not absorb properly.  Rarely, a tumor of the pancreas can produce too much insulin. SYMPTOMS   Hunger.  Sweating (diaphoresis).  Change in body temperature.  Shakiness.  Headache.  Anxiety.  Lightheadedness.  Irritability.  Difficulty concentrating.  Dry mouth.  Tingling or numbness in the hands or feet.  Restless  sleep or sleep disturbances.  Altered speech and coordination.  Change in mental status.  Seizures or prolonged convulsions.  Combativeness.  Drowsiness (lethargic).  Weakness.  Increased heart rate or palpitations.  Confusion.  Pale, gray skin  color.  Blurred or double vision.  Fainting. DIAGNOSIS  A physical exam and medical history will be performed. Your caregiver may make a diagnosis based on your symptoms. Blood tests and other lab tests may be performed to confirm a diagnosis. Once the diagnosis is made, your caregiver will see if your signs and symptoms go away once your blood glucose is raised.  TREATMENT  Usually, you can easily treat your hypoglycemia when you notice symptoms.  Check your blood glucose. If it is less than 70 mg/dl, take one of the following:   3-4 glucose tablets.    cup juice.    cup regular soda.   1 cup skim milk.   -1 tube of glucose gel.   5-6 hard candies.   Avoid high-fat drinks or food that may delay a rise in blood glucose levels.  Do not take more than the recommended amount of sugary foods, drinks, gel, or tablets. Doing so will cause your blood glucose to go too high.   Wait 10-15 minutes and recheck your blood glucose. If it is still less than 70 mg/dl or below your target range, repeat treatment.   Eat a snack if it is more than 1 hour until your next meal.  There may be a time when your blood glucose may go so low that you are unable to treat yourself at home when you start to notice symptoms. You may need someone to help you. You may even faint or be unable to swallow. If you cannot treat yourself, someone will need to bring you to the hospital.  [place]  If you have diabetes, follow your diabetes management plan by:  Taking your medicines as directed.  Following your exercise plan.  Following your meal plan. Do not skip meals. Eat on time.  Testing your blood glucose regularly. Check your blood glucose before and after exercise. If you exercise longer or different than usual, be sure to check blood glucose more frequently.  Wearing your medical alert jewelry that says you have diabetes.  Identify the cause of your hypoglycemia. Then,  develop ways to prevent the recurrence of hypoglycemia.  Do not take a hot bath or shower right after an insulin shot.  Always carry treatment with you. Glucose tablets are the easiest to carry.  If you are going to drink alcohol, drink it only with meals.  Tell friends or family members ways to keep you safe during a seizure. This may include removing hard or sharp objects from the area or turning you on your side.  Maintain a healthy weight. SEEK MEDICAL CARE IF:   You are having problems keeping your blood glucose in your target range.  You are having frequent episodes of hypoglycemia.  You feel you might be having side effects from your medicines.  You are not sure why your blood glucose is dropping so low.  You notice a change in vision or a new problem with your vision. SEEK IMMEDIATE MEDICAL CARE IF:   Confusion develops.  A change in mental status occurs.  The inability to swallow develops.  Fainting occurs.   This information is not intended to replace advice given to you by your health care provider. Make sure you discuss any questions you have with  your health care provider.   Document Released: 05/10/2005 Document Revised: 05/15/2013 Document Reviewed: 01/14/2015 Elsevier Interactive Patient Education [employer].

## 2015-05-15 NOTE — ED Notes (Signed)
Pt given orange juice and snacks

## 2015-05-15 NOTE — ED Notes (Signed)
cbg 74

## 2015-05-16 ENCOUNTER — Telehealth: Payer: Self-pay | Admitting: "Endocrinology

## 2015-05-16 DIAGNOSIS — R234 Changes in skin texture: Secondary | ICD-10-CM | POA: Diagnosis not present

## 2015-05-16 DIAGNOSIS — E11649 Type 2 diabetes mellitus with hypoglycemia without coma: Secondary | ICD-10-CM | POA: Diagnosis not present

## 2015-05-16 DIAGNOSIS — K604 Rectal fistula: Secondary | ICD-10-CM | POA: Diagnosis not present

## 2015-05-16 DIAGNOSIS — I5030 Unspecified diastolic (congestive) heart failure: Secondary | ICD-10-CM | POA: Diagnosis not present

## 2015-05-16 DIAGNOSIS — I130 Hypertensive heart and chronic kidney disease with heart failure and stage 1 through stage 4 chronic kidney disease, or unspecified chronic kidney disease: Secondary | ICD-10-CM | POA: Diagnosis not present

## 2015-05-16 DIAGNOSIS — N183 Chronic kidney disease, stage 3 (moderate): Secondary | ICD-10-CM | POA: Diagnosis not present

## 2015-05-16 DIAGNOSIS — E1122 Type 2 diabetes mellitus with diabetic chronic kidney disease: Secondary | ICD-10-CM | POA: Diagnosis not present

## 2015-05-16 LAB — CBG MONITORING, ED
Glucose-Capillary: 155 mg/dL — ABNORMAL HIGH (ref 65–99)
Glucose-Capillary: 72 mg/dL (ref 65–99)

## 2015-05-16 NOTE — ED Provider Notes (Signed)
Patient has had 2 blood sugars greater than 100. She is tolerating by mouth. She feels at baseline. She denies any chest pain, SOB.  She appears to be stable for discharge.  Ezequiel Essex, MD 05/16/15 [phone]

## 2015-05-16 NOTE — Telephone Encounter (Signed)
Dr. Dorris Fetch prescribed insulin to [name] and she took it last night - her suger dropped to 69 and she went to the hospital. She just wants to know if he still wants her to take the insulin. Please call back

## 2015-05-16 NOTE — ED Notes (Signed)
Pt tolerating PO fluids without difficulty. 

## 2015-05-18 LAB — URINE CULTURE

## 2015-05-19 DIAGNOSIS — E1122 Type 2 diabetes mellitus with diabetic chronic kidney disease: Secondary | ICD-10-CM | POA: Diagnosis not present

## 2015-05-19 DIAGNOSIS — K604 Rectal fistula: Secondary | ICD-10-CM | POA: Diagnosis not present

## 2015-05-19 DIAGNOSIS — R234 Changes in skin texture: Secondary | ICD-10-CM | POA: Diagnosis not present

## 2015-05-19 DIAGNOSIS — N183 Chronic kidney disease, stage 3 (moderate): Secondary | ICD-10-CM | POA: Diagnosis not present

## 2015-05-19 DIAGNOSIS — I130 Hypertensive heart and chronic kidney disease with heart failure and stage 1 through stage 4 chronic kidney disease, or unspecified chronic kidney disease: Secondary | ICD-10-CM | POA: Diagnosis not present

## 2015-05-19 DIAGNOSIS — I5030 Unspecified diastolic (congestive) heart failure: Secondary | ICD-10-CM | POA: Diagnosis not present

## 2015-05-20 ENCOUNTER — Telehealth (HOSPITAL_COMMUNITY): Payer: Self-pay

## 2015-05-20 NOTE — Telephone Encounter (Addendum)
05/19/2015 Post ED Visit - Positive Culture Follow-up: Successful Patient Follow-Up  Culture assessed and recommendations reviewed by: []  Elenor Quinones, Pharm.D. []  Heide Guile, Pharm.D., BCPS []  Parks Neptune, Pharm.D. []  Alycia Rossetti, Pharm.D., BCPS []  [place], Pharm.D., BCPS, AAHIVP []  Legrand Como, Pharm.D., BCPS, AAHIVP []  Milus Glazier, Pharm.D. []  Stephens November, Pharm.D.  Positive urine culture, >/= 100,000 colonies -> E Coli  []  Patient discharged without antimicrobial prescription and treatment is now indicated [x]  prescribed ED discharge antimicrobial (Clindamycin) []  Patient with positive blood cultures  Changes discussed with ED provider: E. [place] PA  If symptomatic, change to  New antibiotic prescription "Keflex 500 mg tid x 7 days" Called to N/A  Contacted patient, date 05/19/2015, time 13:41 No symptoms present Ronn Melena PP RN)  05/20/2015 Chart appended  Dortha Kern 05/20/2015, 3:45 PM

## 2015-05-21 ENCOUNTER — Other Ambulatory Visit: Payer: Self-pay

## 2015-05-21 DIAGNOSIS — R234 Changes in skin texture: Secondary | ICD-10-CM | POA: Diagnosis not present

## 2015-05-21 DIAGNOSIS — N183 Chronic kidney disease, stage 3 (moderate): Secondary | ICD-10-CM | POA: Diagnosis not present

## 2015-05-21 DIAGNOSIS — E1122 Type 2 diabetes mellitus with diabetic chronic kidney disease: Secondary | ICD-10-CM | POA: Diagnosis not present

## 2015-05-21 DIAGNOSIS — I5030 Unspecified diastolic (congestive) heart failure: Secondary | ICD-10-CM | POA: Diagnosis not present

## 2015-05-21 DIAGNOSIS — K604 Rectal fistula: Secondary | ICD-10-CM | POA: Diagnosis not present

## 2015-05-21 DIAGNOSIS — I130 Hypertensive heart and chronic kidney disease with heart failure and stage 1 through stage 4 chronic kidney disease, or unspecified chronic kidney disease: Secondary | ICD-10-CM | POA: Diagnosis not present

## 2015-05-21 MED ORDER — ONETOUCH VERIO W/DEVICE KIT: 1.0000 | PACK | Freq: Two times a day (BID) | Status: DC

## 2015-05-21 MED ORDER — GLUCOSE BLOOD VI STRP: ORAL_STRIP | Status: DC

## 2015-05-23 DIAGNOSIS — N183 Chronic kidney disease, stage 3 (moderate): Secondary | ICD-10-CM | POA: Diagnosis not present

## 2015-05-23 DIAGNOSIS — K604 Rectal fistula: Secondary | ICD-10-CM | POA: Diagnosis not present

## 2015-05-23 DIAGNOSIS — I5030 Unspecified diastolic (congestive) heart failure: Secondary | ICD-10-CM | POA: Diagnosis not present

## 2015-05-23 DIAGNOSIS — R234 Changes in skin texture: Secondary | ICD-10-CM | POA: Diagnosis not present

## 2015-05-23 DIAGNOSIS — I130 Hypertensive heart and chronic kidney disease with heart failure and stage 1 through stage 4 chronic kidney disease, or unspecified chronic kidney disease: Secondary | ICD-10-CM | POA: Diagnosis not present

## 2015-05-23 DIAGNOSIS — E1122 Type 2 diabetes mellitus with diabetic chronic kidney disease: Secondary | ICD-10-CM | POA: Diagnosis not present

## 2015-05-27 DIAGNOSIS — R234 Changes in skin texture: Secondary | ICD-10-CM | POA: Diagnosis not present

## 2015-05-27 DIAGNOSIS — E1122 Type 2 diabetes mellitus with diabetic chronic kidney disease: Secondary | ICD-10-CM | POA: Diagnosis not present

## 2015-05-27 DIAGNOSIS — N183 Chronic kidney disease, stage 3 (moderate): Secondary | ICD-10-CM | POA: Diagnosis not present

## 2015-05-27 DIAGNOSIS — I5030 Unspecified diastolic (congestive) heart failure: Secondary | ICD-10-CM | POA: Diagnosis not present

## 2015-05-27 DIAGNOSIS — I130 Hypertensive heart and chronic kidney disease with heart failure and stage 1 through stage 4 chronic kidney disease, or unspecified chronic kidney disease: Secondary | ICD-10-CM | POA: Diagnosis not present

## 2015-05-27 DIAGNOSIS — K604 Rectal fistula: Secondary | ICD-10-CM | POA: Diagnosis not present

## 2015-05-29 ENCOUNTER — Encounter (HOSPITAL_COMMUNITY)
Admission: RE | Admit: 2015-05-29 | Discharge: 2015-05-29 | Disposition: A | Discharge status: Home / Self Care | Payer: Medicare Other | Source: Ambulatory Visit | Attending: Nephrology | Admitting: Nephrology

## 2015-05-29 DIAGNOSIS — D638 Anemia in other chronic diseases classified elsewhere: Secondary | ICD-10-CM | POA: Diagnosis not present

## 2015-05-29 DIAGNOSIS — K604 Rectal fistula: Secondary | ICD-10-CM | POA: Diagnosis not present

## 2015-05-29 DIAGNOSIS — I5030 Unspecified diastolic (congestive) heart failure: Secondary | ICD-10-CM | POA: Diagnosis not present

## 2015-05-29 DIAGNOSIS — N184 Chronic kidney disease, stage 4 (severe): Secondary | ICD-10-CM | POA: Diagnosis not present

## 2015-05-29 DIAGNOSIS — I130 Hypertensive heart and chronic kidney disease with heart failure and stage 1 through stage 4 chronic kidney disease, or unspecified chronic kidney disease: Secondary | ICD-10-CM | POA: Diagnosis not present

## 2015-05-29 DIAGNOSIS — N183 Chronic kidney disease, stage 3 (moderate): Secondary | ICD-10-CM | POA: Diagnosis not present

## 2015-05-29 DIAGNOSIS — E1122 Type 2 diabetes mellitus with diabetic chronic kidney disease: Secondary | ICD-10-CM | POA: Diagnosis not present

## 2015-05-29 DIAGNOSIS — R234 Changes in skin texture: Secondary | ICD-10-CM | POA: Diagnosis not present

## 2015-05-29 LAB — HEMOGLOBIN AND HEMATOCRIT, BLOOD
HEMATOCRIT: 36.1 % (ref 36.0–46.0)
Hemoglobin: 11.6 g/dL — ABNORMAL LOW (ref 12.0–15.0)

## 2015-05-29 MED ORDER — DARBEPOETIN ALFA 100 MCG/0.5ML IJ SOSY
PREFILLED_SYRINGE | INTRAMUSCULAR | Status: AC
  Filled 2015-05-29: qty 0.5

## 2015-05-29 MED ORDER — DARBEPOETIN ALFA 100 MCG/0.5ML IJ SOSY
100.0000 ug | PREFILLED_SYRINGE | INTRAMUSCULAR | Status: DC
  Administered 2015-05-29: 100 ug via SUBCUTANEOUS

## 2015-05-30 ENCOUNTER — Other Ambulatory Visit: Payer: Self-pay

## 2015-05-30 ENCOUNTER — Encounter: Payer: Self-pay | Admitting: "Endocrinology

## 2015-05-30 ENCOUNTER — Ambulatory Visit (INDEPENDENT_AMBULATORY_CARE_PROVIDER_SITE_OTHER): Payer: Medicare Other | Admitting: "Endocrinology

## 2015-05-30 VITALS — BP 137/69 | HR 57 | Ht 61.0 in

## 2015-05-30 DIAGNOSIS — N184 Chronic kidney disease, stage 4 (severe): Secondary | ICD-10-CM | POA: Diagnosis not present

## 2015-05-30 DIAGNOSIS — E038 Other specified hypothyroidism: Secondary | ICD-10-CM | POA: Diagnosis not present

## 2015-05-30 DIAGNOSIS — I10 Essential (primary) hypertension: Secondary | ICD-10-CM | POA: Diagnosis not present

## 2015-05-30 DIAGNOSIS — E785 Hyperlipidemia, unspecified: Secondary | ICD-10-CM

## 2015-05-30 DIAGNOSIS — E1122 Type 2 diabetes mellitus with diabetic chronic kidney disease: Secondary | ICD-10-CM

## 2015-05-30 MED ORDER — INSULIN ASPART PROT & ASPART (70-30 MIX) 100 UNIT/ML PEN: 10.0000 [IU] | PEN_INJECTOR | Freq: Two times a day (BID) | SUBCUTANEOUS | Status: DC

## 2015-05-30 NOTE — Progress Notes (Signed)
Subjective:    Patient ID: [name], female    DOB: [birth year]. Patient is to follow-up for uncontrolled type 2 diabetes with her meter and logs.  Past Medical History  Diagnosis Date  . Hypertension   . Hyperlipidemia   . GERD (gastroesophageal reflux disease)   . GAD (generalized anxiety disorder)   . Hypothyroidism   . Type II diabetes mellitus ([place])   . Right bundle branch block   . LAFB (left anterior fascicular block)   . History of rectal abscess     12-29-2004  bedside I & D  . History of GI bleed     upper 2009  due to esophagitis  &  2002  due to Mallory-Weiss tear  . History of colon polyps     benign  . Diverticulosis of colon   . History of esophagitis   . Diabetic gastroparesis ([place])   . History of gout     in issues for several years  . Sacral decubitus ulcer     since 2014- 01-02-15 remains with wound" gauze dressing changes daily.  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 Coronary artery disease   . CKD (chronic kidney disease), stage III     nephrologist--  dr Mercy Moore  . Diabetic retinopathy ([place])     bilateral --  monitored by dr [name]  . Arthritis     knees and hand/fingers. "broke back"-being evaluated for this"weakness left leg"  . Anemia in chronic renal disease     Aranesp injection --  when Hg <11, last injection 12-24-14.  Marland Kitchen Anxiety   . Constipation   . Complication of anesthesia     post-op confusion    Past Surgical History  Procedure Laterality Date  . Retinopathy surgery Bilateral 1980's?  . Orif femur fracture Left 10/09/2012    Procedure: OPEN REDUCTION INTERNAL FIXATION (ORIF) DISTAL FEMUR FRACTURE;  Surgeon: Rozanna Box, MD;  Location: [facility];  Service: Orthopedics;  Laterality: Left;  . Compression hip screw Right 05/18/2014    Procedure: COMPRESSION HIP;  Surgeon: Carole Civil, MD;  Location: [facility];  Service: Orthopedics;  Laterality: Right;  . Esophagogastroduodenoscopy  last one  01-09-2011  . Transthoracic echocardiogram  02-18-2011    mild LVH,  ef 55-60%,  grade I diastolic dysfunction/  mild TR/  RV systolic pressure increased consistant with moderate pulmonary hypertension  . Cardiovascular stress test  12-30-2004    normal perfusion study/  no ischemia or infartion/  normal LV wall function and wall motion , ef 66%  . Colonoscopy w/ polypectomy  last one 2008  . Cataract extraction w/ intraocular lens  implant, bilateral  1995  . Incision and drainage of wound N/A 09/30/2014    Procedure: IRRIGATION AND DEBRIDEMENT SACRAL WOUND, EXCISION OF PERIRECTAL TRACT WITH PLACEMENT OF ACCELL;  Surgeon: Theodoro Kos, DO;  Location: [facility];  Service: Plastics;  Laterality: N/A;  . Evaluation under anesthesia with fistulectomy N/A 01/06/2015    Procedure: EXAM UNDER ANESTHESIA , placement of seton;  Surgeon: Jackolyn Confer, MD;  Location: [facility];  Service: General;  Laterality: N/A;  . I&d extremity N/A 04/07/2015    Procedure: IRRIGATION AND DEBRIDEMENT ISCHIAL ULCER;  Surgeon: Loel Lofty Dillingham, DO;  Location: [facility];  Service: Plastics;  Laterality: N/A;  . Application of a-cell of extremity N/A 04/07/2015    Procedure: A CELL PLACMENT;  Surgeon: Loel Lofty Dillingham, DO;  Location: [facility];  Service: Plastics;  Laterality: N/A;   Social History   Social History  . Marital Status: Married    Spouse Name: N/A  . Number of Children: 0  . Years of Education: N/A   Occupational History  .     Social History Main Topics  . Smoking status: Never Smoker   . Smokeless tobacco: Never Used  . Alcohol Use: No  . Drug Use: No  . Sexual Activity: Not Asked   Other Topics Concern  . None   Social History Narrative   Outpatient Encounter Prescriptions as of 05/30/2015  Medication Sig  . atorvastatin (LIPITOR) 40 MG tablet Take 1 tablet (40 mg total) by mouth every evening.  . Blood Glucose Monitoring Suppl (ONETOUCH VERIO) w/Device KIT 1 each by Does not  apply route 2 (two) times daily. Dx E11.65  . calcitRIOL (ROCALTROL) 0.25 MCG capsule TAKE 1 CAPSULE ONE TIME DAILY  . clindamycin (CLEOCIN) 150 MG capsule Take 2 capsules (300 mg total) by mouth 3 (three) times daily.  . cloNIDine (CATAPRES) 0.1 MG tablet Take 1 tablet (0.1 mg total) by mouth 2 (two) times daily.  . furosemide (LASIX) 40 MG tablet Take 1 tablet (40 mg total) by mouth every morning.  Marland Kitchen glucose blood (ONETOUCH VERIO) test strip Test 2 x daily and prn  E11.9  . hydrALAZINE (APRESOLINE) 50 MG tablet Take 1 tablet (50 mg total) by mouth every 8 (eight) hours.  . insulin aspart protamine - aspart (NOVOLOG MIX 70/30 FLEXPEN) (70-30) 100 UNIT/ML FlexPen Inject 0.1 mLs (10 Units total) into the skin 2 (two) times daily with a meal.  . levothyroxine (SYNTHROID, LEVOTHROID) 75 MCG tablet Take 1 tablet (75 mcg total) by mouth daily before breakfast.  . lidocaine (XYLOCAINE) 5 % ointment Apply small amount (3 to 5 grams)  to affected area(s) two times a day  . losartan (COZAAR) 100 MG tablet Take 1 tablet (100 mg total) by mouth every morning.  . metoprolol succinate (TOPROL-XL) 50 MG 24 hr tablet Take 1 tablet (50 mg total) by mouth every morning.  . pantoprazole (PROTONIX) 40 MG tablet Take 1 tablet (40 mg total) by mouth 2 (two) times daily.  . potassium chloride SA (K-DUR,KLOR-CON) 20 MEQ tablet Take 1 tablet (20 mEq total) by mouth 2 (two) times daily.  . [DISCONTINUED] insulin aspart protamine - aspart (NOVOLOG MIX 70/30 FLEXPEN) (70-30) 100 UNIT/ML FlexPen Inject 0.15 mLs (15 Units total) into the skin 2 (two) times daily.  . [DISCONTINUED] linagliptin (TRADJENTA) 5 MG TABS tablet Take 1 tablet (5 mg total) by mouth daily.  Marland Kitchen HYDROcodone-acetaminophen (NORCO) 5-325 MG tablet Take 1 tablet by mouth every 6 (six) hours as needed for moderate pain.  . Insulin Pen Needle (B-D ULTRAFINE III SHORT PEN) 31G X 8 MM MISC 1 each by Does not apply route as directed.  . ondansetron (ZOFRAN) 4 MG  tablet Take 1 tablet (4 mg total) by mouth every 8 (eight) hours as needed for nausea or vomiting.  Glory Rosebush DELICA LANCETS 04V MISC Test one time per day and prn   No facility-administered encounter medications on file as of 05/30/2015.   ALLERGIES: Allergies  Allergen Reactions  . Aspirin Nausea And Vomiting  . Ciprofloxacin Other (See Comments)    Upset Stomach  . Codeine Nausea And Vomiting    Makes me sick   . Micardis [Telmisartan] Other (See Comments)    unknown  . Nexium [Esomeprazole Magnesium]   . Niaspan [  Niacin Er]   . Onglyza [Saxagliptin]   . Other     No otc pain medications  . Rofecoxib Other (See Comments)    unknown  . Simvastatin   . Tequin [Gatifloxacin]   . Welchol [Colesevelam Hcl]   . Amoxicillin Rash   VACCINATION STATUS: Immunization History  Administered Date(s) Administered  . Influenza,inj,Quad PF,36+ Mos 02/16/2013, 02/27/2015  . Pneumococcal Conjugate-13 10/11/2014  . Pneumococcal Polysaccharide-23 10/10/2012  . Tdap 10/06/2012    Diabetes She presents for her follow-up diabetic visit. She has type 2 diabetes mellitus. Onset time: She was diagnosed at approximate age of 9 years . Her disease course has been improving. There are no hypoglycemic associated symptoms. Pertinent negatives for hypoglycemia include no confusion, headaches, pallor or seizures. Associated symptoms include fatigue, polydipsia, polyuria and visual change. Pertinent negatives for diabetes include no chest pain and no polyphagia. There are no hypoglycemic complications. Symptoms are improving. Diabetic complications include nephropathy, peripheral neuropathy, PVD and retinopathy. Risk factors for coronary artery disease include diabetes mellitus, dyslipidemia, hypertension and sedentary lifestyle. Current diabetic treatment includes oral agent (monotherapy). She is compliant with treatment most of the time. Weight trend: Wheelchair-bound. She is following a generally unhealthy  diet. When asked about meal planning, she reported none. She has not had a previous visit with a dietitian. She never participates in exercise. Her home blood glucose trend is increasing steadily. Her breakfast blood glucose range is generally 140-180 mg/dl. Her lunch blood glucose range is generally 140-180 mg/dl. Her dinner blood glucose range is generally 140-180 mg/dl. Her overall blood glucose range is 140-180 mg/dl. An ACE inhibitor/angiotensin II receptor blocker is being taken. Eye exam is current.  Thyroid Problem Presents for initial visit. Onset time: 15 years. Symptoms include fatigue and visual change. Patient reports no cold intolerance, diarrhea, heat intolerance or palpitations. The symptoms have been stable. Past treatments include levothyroxine. The following procedures have not been performed: thyroidectomy.  Hypertension This is a chronic problem. The current episode started more than 1 year ago. Pertinent negatives include no chest pain, headaches, palpitations or shortness of breath. Past treatments include angiotensin blockers. Hypertensive end-organ damage includes PVD, retinopathy and a thyroid problem.      Review of Systems  Constitutional: Positive for fatigue. Negative for unexpected weight change.  HENT: Negative for trouble swallowing and voice change.   Eyes: Negative for visual disturbance.  Respiratory: Negative for cough, shortness of breath and wheezing.   Cardiovascular: Negative for chest pain, palpitations and leg swelling.  Gastrointestinal: Negative for nausea, vomiting and diarrhea.  Endocrine: Positive for polydipsia and polyuria. Negative for cold intolerance, heat intolerance and polyphagia.  Musculoskeletal: Positive for myalgias, back pain, joint swelling, arthralgias and gait problem.       She is wheelchair-bound due to recent hip fracture and diffuse debilitating arthritis.  Skin: Negative for color change, pallor, rash and wound.  Neurological:  Negative for seizures and headaches.  Psychiatric/Behavioral: Negative for suicidal ideas and confusion.    Objective:    BP 137/69 mmHg  Pulse 57  Ht 5\' 1"  (1.549 m)  SpO2 99%  Wt Readings from Last 3 Encounters:  05/15/15 140 lb (63.504 kg)  05/01/15 153 lb 6.4 oz (69.582 kg)  04/07/15 160 lb (72.576 kg)    Physical Exam  Constitutional: She is oriented to person, place, and time. She appears well-developed.  HENT:  Head: Normocephalic and atraumatic.  Eyes: EOM are normal.  Neck: Normal range of motion. Neck supple. No tracheal deviation  present. No thyromegaly present.  Cardiovascular: Normal rate and regular rhythm.  Exam reveals decreased pulses.   Pulses:      Dorsalis pedis pulses are 0 on the right side, and 0 on the left side.       Posterior tibial pulses are 0 on the right side, and 0 on the left side.  Pulmonary/Chest: Effort normal and breath sounds normal.  Abdominal: Soft. Bowel sounds are normal. There is no tenderness. There is no guarding.  Musculoskeletal: She exhibits no edema.       Arms: She is wheelchair-bound due to recent hip fracture and diffuse debilitating arthritis.  Neurological: She is alert and oriented to person, place, and time. She has normal reflexes. A sensory deficit is present. No cranial nerve deficit. Coordination normal.  Skin: Skin is warm and dry. No rash noted. No erythema. No pallor.  Psychiatric: She has a normal mood and affect. Judgment normal.    Results for orders placed or performed during the hospital encounter of 05/29/15  Hemoglobin and hematocrit, blood  Result Value Ref Range   Hemoglobin 11.6 (L) 12.0 - 15.0 g/dL   HCT 36.1 36.0 - 46.0 %   Complete Blood Count (Most recent): Lab Results  Component Value Date   WBC 10.6* 05/15/2015   HGB 11.6* 05/29/2015   HCT 36.1 05/29/2015   MCV 91.0 05/15/2015   PLT 189 05/15/2015   Chemistry (most recent): Lab Results  Component Value Date   NA 137 05/15/2015   K 4.2  05/15/2015   CL 109 05/15/2015   CO2 19* 05/15/2015   BUN 43* 05/15/2015   CREATININE 1.69* 05/15/2015   Diabetic Labs (most recent): Lab Results  Component Value Date   HGBA1C 10.0* 04/02/2015   HGBA1C 6.9* 01/24/2015   HGBA1C 8.8* 08/26/2014   Lipid profile (most recent): Lab Results  Component Value Date   TRIG 170* 10/11/2014   CHOL 153 10/11/2014     Assessment & Plan:   1. Type 2 diabetes mellitus with stage 4 chronic kidney disease, without long-term current use of insulin ([place])   - Patient has currently uncontrolled symptomatic type 2 DM since  70 years of age,  with most recent A1c of  10 %. Recent labs reviewed.   -her  diabetes is complicated by PAD, CKD, neuropathy and patient remains at a high risk for more acute and chronic complications of diabetes which include CAD, CVA, CKD, retinopathy, and neuropathy. These are all discussed in detail with the patient.  - I have counseled the patient on diet management  by adopting a carbohydrate restricted/protein rich diet.  - Suggestion is made for patient to avoid simple carbohydrates   from their diet including Cakes , Desserts, Ice Cream,  Soda (  diet and regular) , Sweet Tea , Candies,  Chips, Cookies, Artificial Sweeteners,   and "Sugar-free" Products . This will help patient to have stable blood glucose profile and potentially avoid unintended weight gain.  - I encouraged the patient to switch to  unprocessed or minimally processed complex starch and increased protein intake (animal or plant source), fruits, and vegetables.  - Patient is advised to stick to a routine mealtimes to eat 3 meals  a day and avoid unnecessary snacks ( to snack only to correct hypoglycemia).  - The patient will be scheduled with Jearld Fenton, RDN, CDE for individualized DM education.  - I have approached patient with the following individualized plan to manage diabetes and patient agrees:   -  Based on her persistent hyperglycemia and  higher A1c of 10% she will need insulin therapy.  - she would need basal/bolus insulin right away, however, she can not reliably use insulin and she has poor social support. -Her husband offers to help. I will lower NovoLog 70/30  To 10 units with breakfast and 10 units with supper for pre-meal glucose above 90 mg/dL associated with monitoring of blood glucose at least twice a day before breakfast and before supper. -If she cannot perform this therapy even with the help of her husband, her best option is placement in skilled care facility.  -Patient is encouraged to call clinic for blood glucose levels less than 70 or above 300 mg /dl. - I will  discontinue   Tradjenta  for cost reasons.  -Patient is not a candidate for MTF, Incretin therapy.   - Patient will be considered for incretin therapy as appropriate next visit. - Patient specific target  A1c;  LDL, HDL, Triglycerides, and  Waist Circumference were discussed in detail.  2) BP/HTN: Controlled. Continue current medications including ACEI/ARB. 3) Lipids/HPL:  continue statins. 4)  Weight/Diet: CDE Consult will be initiated, she has limited ability to exercise. 5)  Hypothyroidism: She is clinically euthyroid - continue LT4 75 mcg po qam.  - We discussed about correct intake of levothyroxine, at fasting, with water, separated by at least 30 minutes from breakfast, and separated by more than 4 hours from calcium, iron, multivitamins, acid reflux medications (PPIs). -Patient is made aware of the fact that thyroid hormone replacement is needed for life, dose to be adjusted by periodic monitoring of thyroid function tests.  5) Chronic Care/Health Maintenance:  -Patient is  on ACEI/ARB and Statin medications and encouraged to continue to follow up with Ophthalmology, Podiatrist at least yearly or according to recommendations, and advised to   stay away from smoking. I have recommended yearly flu vaccine and pneumonia vaccination at least every 5  years; moderate intensity exercise for up to 150 minutes weekly; and  sleep for at least 7 hours a day.  - 30 minutes of time was spent on the care of this patient , 50% of which was applied for counseling on diabetes complications and their preventions.  - Patient to bring meter and  blood glucose logs during their next visit in 2 weeks.   - I advised patient to maintain close follow up with Chevis Pretty, FNP for primary care needs.  Follow up plan: - Return in about 4 weeks (around 06/27/2015) for diabetes, high blood pressure, high cholesterol, underactive thyroid, follow up with meter and logs- no labs.  Glade Lloyd, MD Phone: [phone]  Fax: [phone]   05/30/2015, 1:43 PM

## 2015-05-30 NOTE — Patient Instructions (Signed)

## 2015-06-02 ENCOUNTER — Encounter (HOSPITAL_BASED_OUTPATIENT_CLINIC_OR_DEPARTMENT_OTHER): Payer: Medicare Other | Attending: Internal Medicine

## 2015-06-02 DIAGNOSIS — K604 Rectal fistula: Secondary | ICD-10-CM | POA: Diagnosis not present

## 2015-06-02 DIAGNOSIS — Y838 Other surgical procedures as the cause of abnormal reaction of the patient, or of later complication, without mention of misadventure at the time of the procedure: Secondary | ICD-10-CM | POA: Insufficient documentation

## 2015-06-02 DIAGNOSIS — T8131XA Disruption of external operation (surgical) wound, not elsewhere classified, initial encounter: Secondary | ICD-10-CM | POA: Diagnosis not present

## 2015-06-02 DIAGNOSIS — L89611 Pressure ulcer of right heel, stage 1: Secondary | ICD-10-CM | POA: Insufficient documentation

## 2015-06-02 DIAGNOSIS — E1121 Type 2 diabetes mellitus with diabetic nephropathy: Secondary | ICD-10-CM | POA: Diagnosis not present

## 2015-06-02 DIAGNOSIS — S31813A Puncture wound without foreign body of right buttock, initial encounter: Secondary | ICD-10-CM | POA: Diagnosis not present

## 2015-06-03 ENCOUNTER — Encounter: Payer: Self-pay | Admitting: Neurology

## 2015-06-03 ENCOUNTER — Ambulatory Visit (INDEPENDENT_AMBULATORY_CARE_PROVIDER_SITE_OTHER): Payer: Self-pay | Admitting: Neurology

## 2015-06-03 ENCOUNTER — Ambulatory Visit (INDEPENDENT_AMBULATORY_CARE_PROVIDER_SITE_OTHER): Payer: Medicare Other | Admitting: Neurology

## 2015-06-03 VITALS — BP 147/65 | HR 64 | Ht 61.0 in | Wt 156.4 lb

## 2015-06-03 DIAGNOSIS — G629 Polyneuropathy, unspecified: Secondary | ICD-10-CM

## 2015-06-03 DIAGNOSIS — R5383 Other fatigue: Secondary | ICD-10-CM

## 2015-06-03 DIAGNOSIS — Z0289 Encounter for other administrative examinations: Secondary | ICD-10-CM

## 2015-06-03 DIAGNOSIS — G63 Polyneuropathy in diseases classified elsewhere: Secondary | ICD-10-CM

## 2015-06-03 DIAGNOSIS — M79602 Pain in left arm: Secondary | ICD-10-CM | POA: Diagnosis not present

## 2015-06-03 DIAGNOSIS — E538 Deficiency of other specified B group vitamins: Secondary | ICD-10-CM

## 2015-06-03 DIAGNOSIS — M79605 Pain in left leg: Secondary | ICD-10-CM

## 2015-06-03 MED ORDER — PREGABALIN 50 MG PO CAPS: 50.0000 mg | ORAL_CAPSULE | Freq: Two times a day (BID) | ORAL | Status: DC

## 2015-06-03 NOTE — Progress Notes (Signed)
[facility]    Provider:  Dr Jaynee Eagles Referring Provider: Chevis Pretty, * Primary Care Physician:  Chevis Pretty, FNP  CC:  Left leg pain  HPI:  [name] is a 70 y.o. female here as a referral from Dr. Hassell Done and Dr. Rolena Infante for leg pain. PMHx of diabetes, HTN, HLD, gout, stage 4 CKD, hip fracture s/p repair, diabetic retinopathy,anxiety, depression, obesity, falls.  She broke her right hip a year ago and she went to nursing home. She broke her left distal femur s/p plate abd screw placement and this is when symptoms started. The pain is in the front of the left leg and is in the left shin area when she walks. The pain comes and goes. It is just painful, denies burning, denies tingling, denies numbness in the legs or feet. Feels weak like her left lower leg is going to give way. Stays very local around the front of the knee and front of the shin. She can walk with a walker.  She has uncontrolled diabets and ulcers. She describes more weakness than pain, feels like her left lower leg it is going to give out. Points to the anterolateral lower left leg. Pain waxes and wanes, it has gotten a little better. She has not had physical therapy in quite some time. Difficult for her to leave the house due to multiple medical complaints. They have evaluated her knees and her tib/fib and not found any reason for her pain. No back pain or radicular symptoms. No pain in the feet.   Reviewed notes, labs and imaging from outside physicians, which showed;  Patient referred by Dr. Melina Schools. She was evaluated by him for back pain. She reports symptoms started 6 months ago with hip fracture. The pain radiates to the left leg, lower leg and feels tight. Described as dull and aching. Exacerbated by standing. MRI of the lumbar spine was completed She was referred to neurology because her imaging and physical exam were not significant for low bacl pathology and not a surgical  candidate. They recommended an emg/ncs of the left lower leg.   XR left femur 09/2012: Findings: C-arm spot films show plate and screw fixation of the comminuted distal femoral fracture in good position. IMPRESSION: Plate and screw fixation of comminuted distal femoral fracture.  XR left tib/fib: personally reviewed images and agree with following AP lateral left hip tip patient complained of left leg pain AP lateral tib-fib shows no fracture dislocation of bony abnormality. Alignment is normal At the distal femur we do see Reavis plate fixation with no complications Impression normal left tib-fib  Review of Systems: Patient complains of symptoms per HPI as well as the following symptoms No CP, no SOB. Pertinent negatives per HPI. All others negative.   Social History   Social History  . Marital Status: Married    Spouse Name: [name]  . Number of Children: 0  . Years of Education: 12   Occupational History  . Retired    Social History Main Topics  . Smoking status: Never Smoker   . Smokeless tobacco: Never Used  . Alcohol Use: No  . Drug Use: No  . Sexual Activity: Not on file   Other Topics Concern  . Not on file   Social History Narrative   Lives with husband.    Caffeine use: none    Family History  Problem Relation Age of Onset  . Colon cancer Father   . Prostate cancer Father   .  Diabetes Father   . Coronary artery disease Mother 35  . Heart disease Mother   . Diabetes Mother   . Coronary artery disease Sister 36  . Diabetes Sister   . Heart disease Sister   . Diabetes Maternal Grandmother   . Diabetes Sister   . Diabetes Sister   . Diabetes Sister   . Diabetes Sister   . Diabetes Sister     Past Medical History  Diagnosis Date  . Hypertension   . Hyperlipidemia   . GERD (gastroesophageal reflux disease)   . GAD (generalized anxiety disorder)   . Hypothyroidism   . Type II diabetes mellitus ([place])   . Right bundle branch block   . LAFB (left  anterior fascicular block)   . History of rectal abscess     12-29-2004  bedside I & D  . History of GI bleed     upper 2009  due to esophagitis  &  2002  due to Mallory-Weiss tear  . History of colon polyps     benign  . Diverticulosis of colon   . History of esophagitis   . Diabetic gastroparesis ([place])   . History of gout     in issues for several years  . Sacral decubitus ulcer     since 2014- 01-02-15 remains with wound" gauze dressing changes daily.  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 Coronary artery disease   . CKD (chronic kidney disease), stage III     nephrologist--  dr Mercy Moore  . Diabetic retinopathy ([place])     bilateral --  monitored by dr [name]  . Arthritis     knees and hand/fingers. "broke back"-being evaluated for this"weakness left leg"  . Anemia in chronic renal disease     Aranesp injection --  when Hg <11, last injection 12-24-14.  Marland Kitchen Anxiety   . Constipation   . Complication of anesthesia     post-op confusion     Past Surgical History  Procedure Laterality Date  . Retinopathy surgery Bilateral 1980's?  . Orif femur fracture Left 10/09/2012    Procedure: OPEN REDUCTION INTERNAL FIXATION (ORIF) DISTAL FEMUR FRACTURE;  Surgeon: Rozanna Box, MD;  Location: [facility];  Service: Orthopedics;  Laterality: Left;  . Compression hip screw Right 05/18/2014    Procedure: COMPRESSION HIP;  Surgeon: Carole Civil, MD;  Location: [facility];  Service: Orthopedics;  Laterality: Right;  . Esophagogastroduodenoscopy  last one 01-09-2011  . Transthoracic echocardiogram  02-18-2011    mild LVH,  ef 55-60%,  grade I diastolic dysfunction/  mild TR/  RV systolic pressure increased consistant with moderate pulmonary hypertension  . Cardiovascular stress test  12-30-2004    normal perfusion study/  no ischemia or infartion/  normal LV wall function and wall motion , ef 66%  . Colonoscopy w/ polypectomy  last one 2008  . Cataract extraction w/  intraocular lens  implant, bilateral  1995  . Incision and drainage of wound N/A 09/30/2014    Procedure: IRRIGATION AND DEBRIDEMENT SACRAL WOUND, EXCISION OF PERIRECTAL TRACT WITH PLACEMENT OF ACCELL;  Surgeon: Theodoro Kos, DO;  Location: [facility];  Service: Plastics;  Laterality: N/A;  . Evaluation under anesthesia with fistulectomy N/A 01/06/2015    Procedure: EXAM UNDER ANESTHESIA , placement of seton;  Surgeon: Jackolyn Confer, MD;  Location: [facility];  Service: General;  Laterality: N/A;  . I&d extremity N/A 04/07/2015    Procedure:  IRRIGATION AND DEBRIDEMENT ISCHIAL ULCER;  Surgeon: Loel Lofty Dillingham, DO;  Location: [facility];  Service: Plastics;  Laterality: N/A;  . Application of a-cell of extremity N/A 04/07/2015    Procedure: A CELL PLACMENT;  Surgeon: Loel Lofty Dillingham, DO;  Location: [facility];  Service: Plastics;  Laterality: N/A;    Current Outpatient Prescriptions  Medication Sig Dispense Refill  . atorvastatin (LIPITOR) 40 MG tablet Take 1 tablet (40 mg total) by mouth every evening. 90 tablet 1  . Blood Glucose Monitoring Suppl (ONETOUCH VERIO) w/Device KIT 1 each by Does not apply route 2 (two) times daily. Dx E11.65 1 kit 0  . calcitRIOL (ROCALTROL) 0.25 MCG capsule TAKE 1 CAPSULE ONE TIME DAILY 90 capsule 1  . clindamycin (CLEOCIN) 150 MG capsule Take 2 capsules (300 mg total) by mouth 3 (three) times daily. 30 capsule 0  . cloNIDine (CATAPRES) 0.1 MG tablet Take 1 tablet (0.1 mg total) by mouth 2 (two) times daily. 180 tablet 1  . furosemide (LASIX) 40 MG tablet Take 1 tablet (40 mg total) by mouth every morning. 90 tablet 1  . glucose blood (ONETOUCH VERIO) test strip Test 2 x daily and prn  E11.9 100 each 5  . hydrALAZINE (APRESOLINE) 50 MG tablet Take 1 tablet (50 mg total) by mouth every 8 (eight) hours.    Marland Kitchen HYDROcodone-acetaminophen (NORCO) 5-325 MG tablet Take 1 tablet by mouth every 6 (six) hours as needed for moderate pain. 30 tablet 0  . insulin aspart  protamine - aspart (NOVOLOG MIX 70/30 FLEXPEN) (70-30) 100 UNIT/ML FlexPen Inject 0.1 mLs (10 Units total) into the skin 2 (two) times daily with a meal. 15 mL 2  . Insulin Pen Needle (B-D ULTRAFINE III SHORT PEN) 31G X 8 MM MISC 1 each by Does not apply route as directed. 100 each 3  . levothyroxine (SYNTHROID, LEVOTHROID) 75 MCG tablet Take 1 tablet (75 mcg total) by mouth daily before breakfast. 90 tablet 1  . lidocaine (XYLOCAINE) 5 % ointment Apply small amount (3 to 5 grams)  to affected area(s) two times a day 248.08 g 0  . losartan (COZAAR) 100 MG tablet Take 1 tablet (100 mg total) by mouth every morning. 90 tablet 1  . metoprolol succinate (TOPROL-XL) 50 MG 24 hr tablet Take 1 tablet (50 mg total) by mouth every morning. 90 tablet 1  . ondansetron (ZOFRAN) 4 MG tablet Take 1 tablet (4 mg total) by mouth every 8 (eight) hours as needed for nausea or vomiting. 180 tablet 1  . ONETOUCH DELICA LANCETS 54T MISC Test one time per day and prn 100 each 5  . pantoprazole (PROTONIX) 40 MG tablet Take 1 tablet (40 mg total) by mouth 2 (two) times daily. 90 tablet 1  . potassium chloride SA (K-DUR,KLOR-CON) 20 MEQ tablet Take 1 tablet (20 mEq total) by mouth 2 (two) times daily. 90 tablet 1  . UNIFINE PENTIPS 32G X 4 MM MISC      No current facility-administered medications for this visit.    Allergies as of 06/03/2015 - Review Complete 06/03/2015  Allergen Reaction Noted  . Aspirin Nausea And Vomiting 01/23/2008  . Ciprofloxacin Other (See Comments) 02/10/2011  . Codeine Nausea And Vomiting 01/23/2008  . Micardis [telmisartan] Other (See Comments) 02/10/2011  . Nexium [esomeprazole magnesium]  05/01/2015  . Niaspan [niacin er]  05/01/2015  . Onglyza [saxagliptin]  05/01/2015  . Other  10/17/2014  . Rofecoxib Other (See Comments) 01/23/2008  . Simvastatin  05/01/2015  .  Tequin [gatifloxacin]  05/01/2015  . Welchol [colesevelam hcl]  05/01/2015  . Amoxicillin Rash 05/01/2015     Vitals: BP 147/65 mmHg  Pulse 64  Ht _0  (1.549 m)  Wt 156 lb 6.4 oz (70.943 kg)  BMI 29.57 kg/m2 Last Weight:  Wt Readings from Last 1 Encounters:  06/03/15 156 lb 6.4 oz (70.943 kg)   Last Height:   Ht Readings from Last 1 Encounters:  06/03/15 _1  (1.549 m)   Physical exam: Exam: Gen: NAD, conversant, well nourised, overwight                    CV: RRR, no MRG. No Carotid Bruits. + peripheral edema, warm, nontender Eyes: Conjunctivae clear without exudates or hemorrhage  Neuro: Detailed Neurologic Exam  Speech:    Speech is normal; fluent and spontaneous with normal comprehension.  Cognition:    The patient is oriented to person, place, and time;     recent and remote memory intact;     language fluent;     normal attention, concentration,     fund of knowledge Cranial Nerves:    The pupils are equal, round, and reactive to light. The fundi are flat. Visual fields are full to finger confrontation. Extraocular movements are intact. Trigeminal sensation is intact and the muscles of mastication are normal. The face is symmetric. The palate elevates in the midline. Hearing intact. Voice is normal. Shoulder shrug is normal. The tongue has normal motion without fasciculations.   Coordination:    No dysmetria   Gait:    Uses a walker, did not bring to the office. Can bear weight and walk, can get out of seat unassisted, antalgic gait with eversion of left leg.   Motor Observation:     no involuntary movements noted. Tone:    Normal muscle tone.    Posture:    Posture is normal. normal erect    Strength:Intact upper strength. 3+/5 bilat hip flexion. Othrewise good strength LE including DF/eversion and inversion     Sensation: hyperesthesias and dysesthesias in the feet. Decr temp to the knees. Absent vibration to the knees.      Reflex Exam DTR's:    :Intact uppers, hypo lowers with absent AJs.    Toes:    The toes are downgoing bilaterally.   Clonus:     Clonus is absent.       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 She is here for pain in the lower left extremity, in the anterolateral shin area. Denies burning, numbness, tingling or any paresthesias. Her exam is significant for decreased sensation in the distal extremities however I do not believe that her left leg pain is due to the neuropathy, appears to be more musculoskeletal in nature. EMG/NCS was performed in the clinic today and showed symmetric distal neuropathy as expected without evidence for radiculopathy. Had a long talk with patient and husband, I don not think neuropathy or radiculopathy is causing her left leg/shin pain as it appears to be more musculoskeletal. She has a distal femur fracture on that leg and now walks with left leg eversion which may be causing mechanical pain in the left lower leg. I recommend physical therapy, will request home PT. Calculated creatinine clearance is 35.1 can try some low dose Lyrica 53m twice daily and any further follow up should be with her primary care. Will order home PT for gait abnormality and falls,  she cannot leave the home without assistance. Will order a serum neuropathy panel to check for other causes/contributors of polyneuropathy.  Sarina Ill, MD  [facility] [address], [place] [address]  Phone [phone] Fax [phone]

## 2015-06-03 NOTE — Patient Instructions (Signed)
Overall you are doing fairly well but I do want to suggest a few things today:   Remember to drink plenty of fluid, eat healthy meals and do not skip any meals. Try to eat protein with a every meal and eat a healthy snack such as fruit or nuts in between meals. Try to keep a regular sleep-wake schedule and try to exercise daily, particularly in the form of walking, 20-30 minutes a day, if you can.   As far as your medications are concerned, I would like to suggest: Lyrica 50mg  twice daily.   As far as diagnostic testing: Labs  I would like to see you back as needed, sooner if we need to. Please call us with any interim questions, concerns, problems, updates or refill requests.   Our phone number is [phone]. We also have an after hours call service for urgent matters and there is a physician on-call for urgent questions. For any emergencies you know to call 911 or go to the nearest emergency room

## 2015-06-04 ENCOUNTER — Encounter: Payer: Medicare Other | Attending: "Endocrinology | Admitting: Nutrition

## 2015-06-04 ENCOUNTER — Encounter: Payer: Self-pay | Admitting: Nutrition

## 2015-06-04 ENCOUNTER — Ambulatory Visit: Payer: Medicare Other | Admitting: Nutrition

## 2015-06-04 VITALS — Ht 61.0 in

## 2015-06-04 DIAGNOSIS — R234 Changes in skin texture: Secondary | ICD-10-CM | POA: Diagnosis not present

## 2015-06-04 DIAGNOSIS — E118 Type 2 diabetes mellitus with unspecified complications: Secondary | ICD-10-CM

## 2015-06-04 DIAGNOSIS — IMO0002 Reserved for concepts with insufficient information to code with codable children: Secondary | ICD-10-CM

## 2015-06-04 DIAGNOSIS — Z794 Long term (current) use of insulin: Secondary | ICD-10-CM

## 2015-06-04 DIAGNOSIS — E669 Obesity, unspecified: Secondary | ICD-10-CM

## 2015-06-04 DIAGNOSIS — I130 Hypertensive heart and chronic kidney disease with heart failure and stage 1 through stage 4 chronic kidney disease, or unspecified chronic kidney disease: Secondary | ICD-10-CM | POA: Diagnosis not present

## 2015-06-04 DIAGNOSIS — E1122 Type 2 diabetes mellitus with diabetic chronic kidney disease: Secondary | ICD-10-CM | POA: Insufficient documentation

## 2015-06-04 DIAGNOSIS — I5030 Unspecified diastolic (congestive) heart failure: Secondary | ICD-10-CM | POA: Diagnosis not present

## 2015-06-04 DIAGNOSIS — K604 Rectal fistula: Secondary | ICD-10-CM | POA: Diagnosis not present

## 2015-06-04 DIAGNOSIS — N183 Chronic kidney disease, stage 3 (moderate): Secondary | ICD-10-CM | POA: Diagnosis not present

## 2015-06-04 DIAGNOSIS — N184 Chronic kidney disease, stage 4 (severe): Secondary | ICD-10-CM | POA: Insufficient documentation

## 2015-06-04 DIAGNOSIS — E1165 Type 2 diabetes mellitus with hyperglycemia: Secondary | ICD-10-CM

## 2015-06-04 NOTE — Patient Instructions (Addendum)
   Goals 1. Follow Plate Method 2. Eat 3 carb choices per meal 3. Chair exercises 5 minutes a day 4. Increase low carb veggies with lunch and dinner meals. 5. Get A1C down to 8%.  6. Roll insulin pen in between hands 5-10 times to mix insulins.  7. Rotate the place where you inject insulin in abdomen.

## 2015-06-04 NOTE — Progress Notes (Signed)
  Medical Nutrition Therapy:  Appt start time: [id number] end time:  1630.  Assessment:  Primary concerns today: Diabetes Type 2.. LIves with her husband who does all cooking and shopping.  Most foods are baked and broiled and not fried. Most recent A1C 10%.  Currently taking 10 units of 70/30 in am and 12 units in pm. BS are ranging from 68 to 338 mg/dl, avg 162 mg/dl.  She nor her husband had been given any diabetes education. Seh just wants to know what to eat to control her DM. Has been testing on tips of her fingers inside of her sides of fingers. Husband gives her the insulin in her abdomen but wasn't rotating the sites. She feel and broke her hip and has been in the nursing home and just got out a few weeks ago. Had a low blood sugar in the 20's that sent her to the hospital but BS are better now. Testing blood sugars twice a day. Meter downloaded. Diet is inadequate to meet her nutritional needs. Insuffient in carbs and low carb vegetables.   Lab Results  Component Value Date   HGBA1C 10.0* 04/02/2015    A1 Preferred Learning Style:   No preference indicated    Learning Readiness: Not ready  Contemplating  Ready  Change in progress   MEDICATIONS: See list   DIETARY INTAKE:  24-hr recall:  B ( AM): eggs, 2 slices with toast Snk ( AM):   L ( PM): chicken salad and crackers 2, water. Snk ( PM):  D ( PM): baked beans, toasted chicken salad sandwich, water Snk ( PM): sf jello Beverages: water  Usual physical activity: Limited in wheelchair. Can do chair exercises.  Estimated energy needs: 1200 calories 135 g carbohydrates 90 g protein 33 g fat  Progress Towards Goal(s):  In progress.   Nutritional Diagnosis:  NB-1.1 Food and nutrition-related knowledge deficit As related to Dm.  As evidenced by A1C 10%.    Intervention:  Nutrition and Diabetes education provided on My Plate, CHO counting, meal planning, portion sizes, timing of meals, avoiding snacks between meals  unless having a low blood sugar, target ranges for A1C and blood sugars, signs/symptoms and treatment of hyper/hypoglycemia, monitoring blood sugars, taking medications as prescribed, benefits of exercising 30 minutes per day and prevention of complications of DM.  Goals 1. Follow Plate Method 2. Eat 3 carb choices per meal 3. Chair exercises 5 minutes a day 4. Increase low carb veggies with lunch and dinner meals. 5. Get A1C down to 8%.   Teaching Method Utilized:  Visual Auditory Hands on  Handouts given during visit include:  The Plate Method   Meal Plan Card  Label Reading  Barriers to learning/adherence to lifestyle change:   Demonstrated degree of understanding via:  Teach Back   Monitoring/Evaluation:  Dietary intake, exercise, meal planning, SBG, and body weight in 1 month(s).

## 2015-06-05 ENCOUNTER — Telehealth: Payer: Self-pay | Admitting: Neurology

## 2015-06-05 DIAGNOSIS — W19XXXD Unspecified fall, subsequent encounter: Secondary | ICD-10-CM

## 2015-06-05 DIAGNOSIS — R269 Unspecified abnormalities of gait and mobility: Secondary | ICD-10-CM

## 2015-06-05 LAB — RPR: RPR Ser Ql: NONREACTIVE

## 2015-06-05 LAB — MULTIPLE MYELOMA PANEL, SERUM
ALBUMIN SERPL ELPH-MCNC: 3.3 g/dL (ref 2.9–4.4)
ALBUMIN/GLOB SERPL: 1.1 (ref 0.7–1.7)
ALPHA 1: 0.3 g/dL (ref 0.0–0.4)
ALPHA2 GLOB SERPL ELPH-MCNC: 0.9 g/dL (ref 0.4–1.0)
B-Globulin SerPl Elph-Mcnc: 0.9 g/dL (ref 0.7–1.3)
GAMMA GLOB SERPL ELPH-MCNC: 1.1 g/dL (ref 0.4–1.8)
GLOBULIN, TOTAL: 3.2 g/dL (ref 2.2–3.9)
IGA/IMMUNOGLOBULIN A, SERUM: 173 mg/dL (ref 87–352)
IGM (IMMUNOGLOBULIN M), SRM: 147 mg/dL (ref 26–217)
IgG (Immunoglobin G), Serum: 930 mg/dL (ref 700–1600)
Total Protein: 6.5 g/dL (ref 6.0–8.5)

## 2015-06-05 LAB — TSH: TSH: 0.795 u[IU]/mL (ref 0.450–4.500)

## 2015-06-05 LAB — HEPATITIS C ANTIBODY: HEP C VIRUS AB: 0.1 {s_co_ratio} (ref 0.0–0.9)

## 2015-06-05 LAB — B12 AND FOLATE PANEL
Folate: 4.9 ng/mL (ref >3.0)
Vitamin B-12: 174 pg/mL — ABNORMAL LOW (ref 211–946)

## 2015-06-05 NOTE — Telephone Encounter (Signed)
[name], can you place referral for [name] for home PT please?   Her labs also show significantly decreased B12. This can cause many serious conditions including peripheral neuropathy and dementia. It is important that she starts taking 1000-2029mcg/day of B12 and she has her B12 checked by primary care in a month. If her B12 has not improved, she may need shots of B12.   Please forward these results to her pcp with notes stating we have started her on oral therapy with instructions to follow up with primary care in one month for re-testing. thanks

## 2015-06-05 NOTE — Telephone Encounter (Signed)
Called and spoke to pt about results per Dr Jaynee Eagles note below. She verbalized understanding. Had pt read back to verify she understood. She will contact PCP tomorrow to let them know and to request results. Cannot send since they did not refer pt to office here. She will have to sign record release form. She understands. Gave B12 level of 174. Also verbalized we are still working on referral to [facility].

## 2015-06-06 DIAGNOSIS — K604 Rectal fistula: Secondary | ICD-10-CM | POA: Diagnosis not present

## 2015-06-06 DIAGNOSIS — E1122 Type 2 diabetes mellitus with diabetic chronic kidney disease: Secondary | ICD-10-CM | POA: Diagnosis not present

## 2015-06-06 DIAGNOSIS — N183 Chronic kidney disease, stage 3 (moderate): Secondary | ICD-10-CM | POA: Diagnosis not present

## 2015-06-06 DIAGNOSIS — R234 Changes in skin texture: Secondary | ICD-10-CM | POA: Diagnosis not present

## 2015-06-06 DIAGNOSIS — I130 Hypertensive heart and chronic kidney disease with heart failure and stage 1 through stage 4 chronic kidney disease, or unspecified chronic kidney disease: Secondary | ICD-10-CM | POA: Diagnosis not present

## 2015-06-06 DIAGNOSIS — I5030 Unspecified diastolic (congestive) heart failure: Secondary | ICD-10-CM | POA: Diagnosis not present

## 2015-06-06 NOTE — Telephone Encounter (Signed)
Placed referral to [facility] and sent message through [facility] to Miguel Dibble at [facility] that referral was placed.

## 2015-06-07 NOTE — Progress Notes (Signed)
  [facility]    Provider:  Dr Jaynee Eagles Referring Provider: Chevis Pretty, * Primary Care Physician:  Chevis Pretty, FNP  History:  [name] is a 70 y.o. female here as a referral from Dr. Hassell Done and Dr. Rolena Infante for for leg pain. PMHx of diabetes, HTN, HLD, gout, stage 4 CKD, hip fracture s/p repair, diabetic  retinopathy,anxiety, depression, obesity, falls. She broke her right hip a year ago and she went to nursing home. She broke her left distal femur s/p plate abd screw placement and this is when symptoms started. The pain is in the front of the left leg and is in the left shin area when she walks. The pain comes and goes. It is just painful, denies burning, denies tingling, denies numbness in the legs or feet. Feels weak like her left lower leg is going to give way. Stays very local around the front of the knee and front of the shin. She can walk with a walker. She has uncontrolled diabets and ulcers. She describes more weakness than pain, feels like her left lower leg it is going to give out. Points to the anterolateral lower left leg. Pain waxes and wanes, it has gotten a little better. She has not had physical therapy in quite some time. Difficult for her to leave the house due to multiple medical complaints. They have evaluated her knees and her tib/fib and not found any reason for her pain. No back pain or radicular symptoms. No pain in the feet.    Summary: Evaluation of the right peroneal motor nerve showed  reduced amplitude (0.20 mV, N>2)  Evaluation of the left peroneal motor nerve showed  reduced amplitude (0.67 mV, N>2)  Evaluation of the right Tibial motor nerve showed  reduced amplitude (0.67 mV, N>3)  Evaluation of the left Tibial motor nerve showed  reduced amplitude (1.6 mV, N>3)   The bilateral superficial peroneal sensory nerves showed no response (Ankle).   The bilateral sural sensory nerves showed no response (Calf).    F Wave studies  indicate that the the right tibial F wave has no response and the right peroneal F wave has no response.  Bilateral H Reflex showed no response.     Needle evaluation of the bilateral Abductor Hallucis muscles showed showed moderately increased spontaneous activity(positive sharp waves). The following muscles were within normal limits bilaterally:  Gluteus Maximus and Medius, Biceps Femoris (long head), Vastus Medialis, Anterior Tibialis, Medial Gastroc, Extensor Hallucis and L5/S1 paraspinals.    Conclusion: There is electrophysiologic evidence of a severe, symmetric, length-dependent, sensorimotor, axonal polyneuropathy.  No suggestion of radiculopathy.   Sarina Ill, MD  [facility] [address] [place], [place] [address]  Phone [phone] Fax [phone]

## 2015-06-07 NOTE — Procedures (Signed)
  [facility]    Provider:  Dr Jaynee Eagles Referring Provider: Chevis Pretty, * Primary Care Physician:  Chevis Pretty, FNP  History:  [place] [name] is a 70 y.o. female here as a referral from Dr. Hassell Done and Dr. Rolena Infante for for leg pain. PMHx of diabetes, HTN, HLD, gout, stage 4 CKD, hip fracture s/p repair, diabetic  retinopathy,anxiety, depression, obesity, falls. She broke her right hip a year ago and she went to nursing home. She broke her left distal femur s/p plate abd screw placement and this is when symptoms started. The pain is in the front of the left leg and is in the left shin area when she walks. The pain comes and goes. It is just painful, denies burning, denies tingling, denies numbness in the legs or feet. Feels weak like her left lower leg is going to give way. Stays very local around the front of the knee and front of the shin. She can walk with a walker. She has uncontrolled diabets and ulcers. She describes more weakness than pain, feels like her left lower leg it is going to give out. Points to the anterolateral lower left leg. Pain waxes and wanes, it has gotten a little better. She has not had physical therapy in quite some time. Difficult for her to leave the house due to multiple medical complaints. They have evaluated her knees and her tib/fib and not found any reason for her pain. No back pain or radicular symptoms. No pain in the feet.    Summary: Evaluation of the right peroneal motor nerve showed  reduced amplitude (0.20 mV, N>2)  Evaluation of the left peroneal motor nerve showed  reduced amplitude (0.67 mV, N>2)  Evaluation of the right Tibial motor nerve showed  reduced amplitude (0.67 mV, N>3)  Evaluation of the left Tibial motor nerve showed  reduced amplitude (1.6 mV, N>3)   The bilateral superficial peroneal sensory nerves showed no response (Ankle).   The bilateral sural sensory nerves showed no response (Calf).    F Wave studies  indicate that the the right tibial F wave has no response and the right peroneal F wave has no response.  Bilateral H Reflex showed no response.     Needle evaluation of the bilateral Abductor Hallucis muscles showed showed moderately increased spontaneous activity(positive sharp waves). The following muscles were within normal limits bilaterally:  Gluteus Maximus and Medius, Biceps Femoris (long head), Vastus Medialis, Anterior Tibialis, Medial Gastroc, Extensor Hallucis and L5/S1 paraspinals.    Conclusion: There is electrophysiologic evidence of a severe, symmetric, length-dependent, sensorimotor, axonal polyneuropathy.  No suggestion of radiculopathy.   Sarina Ill, MD  [facility] [address] [place], [place] [address]  Phone [phone] Fax [phone]

## 2015-06-09 DIAGNOSIS — I130 Hypertensive heart and chronic kidney disease with heart failure and stage 1 through stage 4 chronic kidney disease, or unspecified chronic kidney disease: Secondary | ICD-10-CM | POA: Diagnosis not present

## 2015-06-09 DIAGNOSIS — E1122 Type 2 diabetes mellitus with diabetic chronic kidney disease: Secondary | ICD-10-CM | POA: Diagnosis not present

## 2015-06-09 DIAGNOSIS — R234 Changes in skin texture: Secondary | ICD-10-CM | POA: Diagnosis not present

## 2015-06-09 DIAGNOSIS — N183 Chronic kidney disease, stage 3 (moderate): Secondary | ICD-10-CM | POA: Diagnosis not present

## 2015-06-09 DIAGNOSIS — K604 Rectal fistula: Secondary | ICD-10-CM | POA: Diagnosis not present

## 2015-06-09 DIAGNOSIS — I5030 Unspecified diastolic (congestive) heart failure: Secondary | ICD-10-CM | POA: Diagnosis not present

## 2015-06-11 DIAGNOSIS — E1122 Type 2 diabetes mellitus with diabetic chronic kidney disease: Secondary | ICD-10-CM | POA: Diagnosis not present

## 2015-06-11 DIAGNOSIS — I5030 Unspecified diastolic (congestive) heart failure: Secondary | ICD-10-CM | POA: Diagnosis not present

## 2015-06-11 DIAGNOSIS — R234 Changes in skin texture: Secondary | ICD-10-CM | POA: Diagnosis not present

## 2015-06-11 DIAGNOSIS — I130 Hypertensive heart and chronic kidney disease with heart failure and stage 1 through stage 4 chronic kidney disease, or unspecified chronic kidney disease: Secondary | ICD-10-CM | POA: Diagnosis not present

## 2015-06-11 DIAGNOSIS — K604 Rectal fistula: Secondary | ICD-10-CM | POA: Diagnosis not present

## 2015-06-11 DIAGNOSIS — N183 Chronic kidney disease, stage 3 (moderate): Secondary | ICD-10-CM | POA: Diagnosis not present

## 2015-06-13 DIAGNOSIS — I130 Hypertensive heart and chronic kidney disease with heart failure and stage 1 through stage 4 chronic kidney disease, or unspecified chronic kidney disease: Secondary | ICD-10-CM | POA: Diagnosis not present

## 2015-06-13 DIAGNOSIS — K604 Rectal fistula: Secondary | ICD-10-CM | POA: Diagnosis not present

## 2015-06-13 DIAGNOSIS — E1122 Type 2 diabetes mellitus with diabetic chronic kidney disease: Secondary | ICD-10-CM | POA: Diagnosis not present

## 2015-06-13 DIAGNOSIS — N183 Chronic kidney disease, stage 3 (moderate): Secondary | ICD-10-CM | POA: Diagnosis not present

## 2015-06-13 DIAGNOSIS — R234 Changes in skin texture: Secondary | ICD-10-CM | POA: Diagnosis not present

## 2015-06-13 DIAGNOSIS — I5030 Unspecified diastolic (congestive) heart failure: Secondary | ICD-10-CM | POA: Diagnosis not present

## 2015-06-16 DIAGNOSIS — N183 Chronic kidney disease, stage 3 (moderate): Secondary | ICD-10-CM | POA: Diagnosis not present

## 2015-06-16 DIAGNOSIS — K604 Rectal fistula: Secondary | ICD-10-CM | POA: Diagnosis not present

## 2015-06-16 DIAGNOSIS — E1122 Type 2 diabetes mellitus with diabetic chronic kidney disease: Secondary | ICD-10-CM | POA: Diagnosis not present

## 2015-06-16 DIAGNOSIS — I5030 Unspecified diastolic (congestive) heart failure: Secondary | ICD-10-CM | POA: Diagnosis not present

## 2015-06-16 DIAGNOSIS — I130 Hypertensive heart and chronic kidney disease with heart failure and stage 1 through stage 4 chronic kidney disease, or unspecified chronic kidney disease: Secondary | ICD-10-CM | POA: Diagnosis not present

## 2015-06-16 DIAGNOSIS — R234 Changes in skin texture: Secondary | ICD-10-CM | POA: Diagnosis not present

## 2015-06-18 DIAGNOSIS — E1122 Type 2 diabetes mellitus with diabetic chronic kidney disease: Secondary | ICD-10-CM | POA: Diagnosis not present

## 2015-06-18 DIAGNOSIS — R234 Changes in skin texture: Secondary | ICD-10-CM | POA: Diagnosis not present

## 2015-06-18 DIAGNOSIS — I5030 Unspecified diastolic (congestive) heart failure: Secondary | ICD-10-CM | POA: Diagnosis not present

## 2015-06-18 DIAGNOSIS — N183 Chronic kidney disease, stage 3 (moderate): Secondary | ICD-10-CM | POA: Diagnosis not present

## 2015-06-18 DIAGNOSIS — K604 Rectal fistula: Secondary | ICD-10-CM | POA: Diagnosis not present

## 2015-06-18 DIAGNOSIS — I130 Hypertensive heart and chronic kidney disease with heart failure and stage 1 through stage 4 chronic kidney disease, or unspecified chronic kidney disease: Secondary | ICD-10-CM | POA: Diagnosis not present

## 2015-06-19 ENCOUNTER — Telehealth: Payer: Self-pay | Admitting: *Deleted

## 2015-06-19 DIAGNOSIS — R234 Changes in skin texture: Secondary | ICD-10-CM | POA: Diagnosis not present

## 2015-06-19 DIAGNOSIS — I130 Hypertensive heart and chronic kidney disease with heart failure and stage 1 through stage 4 chronic kidney disease, or unspecified chronic kidney disease: Secondary | ICD-10-CM | POA: Diagnosis not present

## 2015-06-19 DIAGNOSIS — K604 Rectal fistula: Secondary | ICD-10-CM | POA: Diagnosis not present

## 2015-06-19 DIAGNOSIS — E1122 Type 2 diabetes mellitus with diabetic chronic kidney disease: Secondary | ICD-10-CM | POA: Diagnosis not present

## 2015-06-19 DIAGNOSIS — I5030 Unspecified diastolic (congestive) heart failure: Secondary | ICD-10-CM | POA: Diagnosis not present

## 2015-06-19 DIAGNOSIS — N183 Chronic kidney disease, stage 3 (moderate): Secondary | ICD-10-CM | POA: Diagnosis not present

## 2015-06-19 NOTE — Telephone Encounter (Signed)
[name] called and said patients blood pressure is way too high, it was 182/90 first time, then 20 minutes later it was 220/100. It was fine yesterday. Please call pt and nurse

## 2015-06-19 NOTE — Telephone Encounter (Signed)
FYI      Patient calls to report she had a nurse visit and her BP was now up at 222/101.  She had eaten sausage, barbecue and eggs with salt earlier. She was advised to stop salt consumption , wait 15 minutes and re take pressure.  If it stays elevated , call our triage to schedule with provider for evaluation.

## 2015-06-19 NOTE — Telephone Encounter (Signed)
If patient anxious or upset about anything

## 2015-06-20 ENCOUNTER — Emergency Department (HOSPITAL_COMMUNITY)
Admission: EM | Admit: 2015-06-20 | Discharge: 2015-06-20 | Discharge status: Against Medical Advice | Payer: Medicare Other | Attending: Emergency Medicine | Admitting: Emergency Medicine

## 2015-06-20 ENCOUNTER — Encounter (HOSPITAL_COMMUNITY): Payer: Self-pay

## 2015-06-20 DIAGNOSIS — I130 Hypertensive heart and chronic kidney disease with heart failure and stage 1 through stage 4 chronic kidney disease, or unspecified chronic kidney disease: Secondary | ICD-10-CM | POA: Diagnosis not present

## 2015-06-20 DIAGNOSIS — I2510 Atherosclerotic heart disease of native coronary artery without angina pectoris: Secondary | ICD-10-CM | POA: Insufficient documentation

## 2015-06-20 DIAGNOSIS — R234 Changes in skin texture: Secondary | ICD-10-CM | POA: Diagnosis not present

## 2015-06-20 DIAGNOSIS — E11319 Type 2 diabetes mellitus with unspecified diabetic retinopathy without macular edema: Secondary | ICD-10-CM | POA: Diagnosis not present

## 2015-06-20 DIAGNOSIS — N183 Chronic kidney disease, stage 3 (moderate): Secondary | ICD-10-CM | POA: Diagnosis not present

## 2015-06-20 DIAGNOSIS — E1122 Type 2 diabetes mellitus with diabetic chronic kidney disease: Secondary | ICD-10-CM | POA: Diagnosis not present

## 2015-06-20 DIAGNOSIS — I129 Hypertensive chronic kidney disease with stage 1 through stage 4 chronic kidney disease, or unspecified chronic kidney disease: Secondary | ICD-10-CM | POA: Insufficient documentation

## 2015-06-20 DIAGNOSIS — K604 Rectal fistula: Secondary | ICD-10-CM | POA: Diagnosis not present

## 2015-06-20 DIAGNOSIS — I5030 Unspecified diastolic (congestive) heart failure: Secondary | ICD-10-CM | POA: Diagnosis not present

## 2015-06-20 LAB — CBG MONITORING, ED: Glucose-Capillary: 208 mg/dL — ABNORMAL HIGH (ref 65–99)

## 2015-06-20 NOTE — ED Notes (Signed)
Pt reports home health therapist came to her house yesterday and bp was 233/90 and today home health rechecked bp and it was 172/90 and cbg was 147.  Pt denies any symptoms.

## 2015-06-20 NOTE — ED Notes (Signed)
Pt says since her bp is better she does not want to wait to see the doctor.  Informed pt she had the right to a medical screening.  Pt says she wants to leave. Instructed pt and spouse to come back anytime.  Pt and family verbalized understanding.

## 2015-06-21 ENCOUNTER — Emergency Department (HOSPITAL_COMMUNITY)
Admission: EM | Admit: 2015-06-21 | Discharge: 2015-06-21 | Disposition: A | Discharge status: Home / Self Care | Payer: Medicare Other | Attending: Emergency Medicine | Admitting: Emergency Medicine

## 2015-06-21 ENCOUNTER — Encounter (HOSPITAL_COMMUNITY): Payer: Self-pay | Admitting: Emergency Medicine

## 2015-06-21 DIAGNOSIS — I129 Hypertensive chronic kidney disease with stage 1 through stage 4 chronic kidney disease, or unspecified chronic kidney disease: Secondary | ICD-10-CM | POA: Diagnosis not present

## 2015-06-21 DIAGNOSIS — E11319 Type 2 diabetes mellitus with unspecified diabetic retinopathy without macular edema: Secondary | ICD-10-CM | POA: Insufficient documentation

## 2015-06-21 DIAGNOSIS — K219 Gastro-esophageal reflux disease without esophagitis: Secondary | ICD-10-CM | POA: Diagnosis not present

## 2015-06-21 DIAGNOSIS — Z880 Allergy status to penicillin: Secondary | ICD-10-CM | POA: Diagnosis not present

## 2015-06-21 DIAGNOSIS — F419 Anxiety disorder, unspecified: Secondary | ICD-10-CM | POA: Insufficient documentation

## 2015-06-21 DIAGNOSIS — Z86010 Personal history of colonic polyps: Secondary | ICD-10-CM | POA: Insufficient documentation

## 2015-06-21 DIAGNOSIS — Z872 Personal history of diseases of the skin and subcutaneous tissue: Secondary | ICD-10-CM | POA: Diagnosis not present

## 2015-06-21 DIAGNOSIS — Z79899 Other long term (current) drug therapy: Secondary | ICD-10-CM | POA: Diagnosis not present

## 2015-06-21 DIAGNOSIS — Z794 Long term (current) use of insulin: Secondary | ICD-10-CM | POA: Diagnosis not present

## 2015-06-21 DIAGNOSIS — D631 Anemia in chronic kidney disease: Secondary | ICD-10-CM | POA: Insufficient documentation

## 2015-06-21 DIAGNOSIS — N183 Chronic kidney disease, stage 3 (moderate): Secondary | ICD-10-CM | POA: Insufficient documentation

## 2015-06-21 DIAGNOSIS — E039 Hypothyroidism, unspecified: Secondary | ICD-10-CM | POA: Insufficient documentation

## 2015-06-21 DIAGNOSIS — I2510 Atherosclerotic heart disease of native coronary artery without angina pectoris: Secondary | ICD-10-CM | POA: Diagnosis not present

## 2015-06-21 DIAGNOSIS — Z8739 Personal history of other diseases of the musculoskeletal system and connective tissue: Secondary | ICD-10-CM | POA: Diagnosis not present

## 2015-06-21 DIAGNOSIS — I10 Essential (primary) hypertension: Secondary | ICD-10-CM

## 2015-06-21 LAB — CBC WITH DIFFERENTIAL/PLATELET
BASOS ABS: 0 10*3/uL (ref 0.0–0.1)
BASOS PCT: 0 %
EOS PCT: 1 %
Eosinophils Absolute: 0.1 10*3/uL (ref 0.0–0.7)
HCT: 38.1 % (ref 36.0–46.0)
HEMOGLOBIN: 12.1 g/dL (ref 12.0–15.0)
Lymphocytes Relative: 24 %
Lymphs Abs: 2 10*3/uL (ref 0.7–4.0)
MCH: 29 pg (ref 26.0–34.0)
MCHC: 31.8 g/dL (ref 30.0–36.0)
MCV: 91.4 fL (ref 78.0–100.0)
MONO ABS: 0.5 10*3/uL (ref 0.1–1.0)
MONOS PCT: 6 %
NEUTROS ABS: 5.9 10*3/uL (ref 1.7–7.7)
Neutrophils Relative %: 69 %
PLATELETS: 209 10*3/uL (ref 150–400)
RBC: 4.17 MIL/uL (ref 3.87–5.11)
RDW: 13.9 % (ref 11.5–15.5)
WBC: 8.6 10*3/uL (ref 4.0–10.5)

## 2015-06-21 LAB — BASIC METABOLIC PANEL
Anion gap: 9 (ref 5–15)
BUN: 24 mg/dL — AB (ref 6–20)
CALCIUM: 9.2 mg/dL (ref 8.9–10.3)
CHLORIDE: 106 mmol/L (ref 101–111)
CO2: 24 mmol/L (ref 22–32)
CREATININE: 1.34 mg/dL — AB (ref 0.44–1.00)
GFR, EST AFRICAN AMERICAN: 45 mL/min — AB (ref >60)
GFR, EST NON AFRICAN AMERICAN: 39 mL/min — AB (ref >60)
Glucose, Bld: 154 mg/dL — ABNORMAL HIGH (ref 65–99)
Potassium: 4.9 mmol/L (ref 3.5–5.1)
SODIUM: 139 mmol/L (ref 135–145)

## 2015-06-21 LAB — CBG MONITORING, ED: GLUCOSE-CAPILLARY: 142 mg/dL — AB (ref 65–99)

## 2015-06-21 NOTE — Discharge Instructions (Signed)
Follow up with your md next week.  Check your bp only once a day in the morning and write it down.  Give the results to your md this week.

## 2015-06-21 NOTE — ED Notes (Signed)
Patient c/o hypertension. Per husband patient's blood pressure was elevated yesterday and they came but while here blood pressure "was okay, so they left." Per husband blood pressure 204/91 today. Patient denies any symptoms. Blood pressure today in triage 138/57.

## 2015-06-21 NOTE — ED Notes (Signed)
MD at bedside. 

## 2015-06-21 NOTE — ED Provider Notes (Signed)
CSN: [id number]     Arrival date & time 06/21/15  1311 History   First MD Initiated Contact with Patient 06/21/15 1325     Chief Complaint  Patient presents with  . Hypertension     (Consider location/radiation/quality/duration/timing/severity/associated sxs/prior Treatment) Patient is a 70 y.o. female presenting with hypertension. The history is provided by the patient (patient states her blood pressure was elevated today but she has no symptoms from it).  Hypertension This is a chronic problem. The current episode started 6 to 12 hours ago. The problem occurs constantly. The problem has not changed since onset.Pertinent negatives include no chest pain, no abdominal pain and no headaches. Nothing aggravates the symptoms.    Past Medical History  Diagnosis Date  . Hypertension   . Hyperlipidemia   . GERD (gastroesophageal reflux disease)   . GAD (generalized anxiety disorder)   . Hypothyroidism   . Type II diabetes mellitus ([place])   . Right bundle branch block   . LAFB (left anterior fascicular block)   . History of rectal abscess     12-29-2004  bedside I & D  . History of GI bleed     upper 2009  due to esophagitis  &  2002  due to Mallory-Weiss tear  . History of colon polyps     benign  . Diverticulosis of colon   . History of esophagitis   . Diabetic gastroparesis ([place])   . History of gout     in issues for several years  . Sacral decubitus ulcer     since 2014- 01-02-15 remains with wound" gauze dressing changes daily.  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 Coronary artery disease   . CKD (chronic kidney disease), stage III     nephrologist--  dr Mercy Moore  . Diabetic retinopathy ([place])     bilateral --  monitored by dr [name]  . Arthritis     knees and hand/fingers. "broke back"-being evaluated for this"weakness left leg"  . Anemia in chronic renal disease     Aranesp injection --  when Hg <11, last injection 12-24-14.  Marland Kitchen Anxiety   .  Constipation   . Complication of anesthesia     post-op confusion    Past Surgical History  Procedure Laterality Date  . Retinopathy surgery Bilateral 1980's?  . Orif femur fracture Left 10/09/2012    Procedure: OPEN REDUCTION INTERNAL FIXATION (ORIF) DISTAL FEMUR FRACTURE;  Surgeon: Rozanna Box, MD;  Location: [facility];  Service: Orthopedics;  Laterality: Left;  . Compression hip screw Right 05/18/2014    Procedure: COMPRESSION HIP;  Surgeon: Carole Civil, MD;  Location: [facility];  Service: Orthopedics;  Laterality: Right;  . Esophagogastroduodenoscopy  last one 01-09-2011  . Transthoracic echocardiogram  02-18-2011    mild LVH,  ef 55-60%,  grade I diastolic dysfunction/  mild TR/  RV systolic pressure increased consistant with moderate pulmonary hypertension  . Cardiovascular stress test  12-30-2004    normal perfusion study/  no ischemia or infartion/  normal LV wall function and wall motion , ef 66%  . Colonoscopy w/ polypectomy  last one 2008  . Cataract extraction w/ intraocular lens  implant, bilateral  1995  . Incision and drainage of wound N/A 09/30/2014    Procedure: IRRIGATION AND DEBRIDEMENT SACRAL WOUND, EXCISION OF PERIRECTAL TRACT WITH PLACEMENT OF ACCELL;  Surgeon: Theodoro Kos, DO;  Location: [facility];  Service: Plastics;  Laterality:  N/A;  . Evaluation under anesthesia with fistulectomy N/A 01/06/2015    Procedure: EXAM UNDER ANESTHESIA , placement of seton;  Surgeon: Jackolyn Confer, MD;  Location: [facility];  Service: General;  Laterality: N/A;  . I&d extremity N/A 04/07/2015    Procedure: IRRIGATION AND DEBRIDEMENT ISCHIAL ULCER;  Surgeon: Loel Lofty Dillingham, DO;  Location: [facility];  Service: Plastics;  Laterality: N/A;  . Application of a-cell of extremity N/A 04/07/2015    Procedure: A CELL PLACMENT;  Surgeon: Loel Lofty Dillingham, DO;  Location: [facility];  Service: Plastics;  Laterality: N/A;   Family History  Problem Relation Age of Onset  . Colon  cancer Father   . Prostate cancer Father   . Diabetes Father   . Coronary artery disease Mother 52  . Heart disease Mother   . Diabetes Mother   . Coronary artery disease Sister 107  . Diabetes Sister   . Heart disease Sister   . Diabetes Maternal Grandmother   . Diabetes Sister   . Diabetes Sister   . Diabetes Sister   . Diabetes Sister   . Diabetes Sister    Social History  Substance Use Topics  . Smoking status: Never Smoker   . Smokeless tobacco: Never Used  . Alcohol Use: No   OB History    No data available     Review of Systems  Constitutional: Negative for appetite change and fatigue.  HENT: Negative for congestion, ear discharge and sinus pressure.   Eyes: Negative for discharge.  Respiratory: Negative for cough.   Cardiovascular: Negative for chest pain.  Gastrointestinal: Negative for abdominal pain and diarrhea.  Genitourinary: Negative for frequency and hematuria.  Musculoskeletal: Negative for back pain.  Skin: Negative for rash.  Neurological: Negative for seizures and headaches.  Psychiatric/Behavioral: Negative for hallucinations.      Allergies  Aspirin; Ciprofloxacin; Codeine; Micardis; Nexium; Niaspan; Onglyza; Other; Rofecoxib; Simvastatin; Tequin; Welchol; and Amoxicillin  Home Medications   Prior to Admission medications   Medication Sig Start Date End Date Taking? Authorizing Provider  calcitRIOL (ROCALTROL) 0.25 MCG capsule TAKE 1 CAPSULE ONE TIME DAILY 03/07/15  Yes Mary-Margaret Hassell Done, FNP  cloNIDine (CATAPRES) 0.1 MG tablet Take 1 tablet (0.1 mg total) by mouth 2 (two) times daily. 02/03/15  Yes Mary-Margaret Hassell Done, FNP  furosemide (LASIX) 40 MG tablet Take 1 tablet (40 mg total) by mouth every morning. 02/03/15  Yes Mary-Margaret Hassell Done, FNP  hydrALAZINE (APRESOLINE) 50 MG tablet Take 1 tablet (50 mg total) by mouth every 8 (eight) hours. 01/24/15  Yes Lezlie Octave Black, NP  insulin aspart protamine - aspart (NOVOLOG MIX 70/30 FLEXPEN)  (70-30) 100 UNIT/ML FlexPen Inject 0.1 mLs (10 Units total) into the skin 2 (two) times daily with a meal. Patient taking differently: Inject 10-12 Units into the skin 2 (two) times daily with a meal.  05/30/15  Yes Cassandria Anger, MD  levothyroxine (SYNTHROID, LEVOTHROID) 75 MCG tablet Take 1 tablet (75 mcg total) by mouth daily before breakfast. 02/03/15  Yes Mary-Margaret Hassell Done, FNP  lidocaine (XYLOCAINE) 5 % ointment Apply small amount (3 to 5 grams)  to affected area(s) two times a day 05/12/15  Yes Mary-Margaret Hassell Done, FNP  losartan (COZAAR) 100 MG tablet Take 1 tablet (100 mg total) by mouth every morning. 02/03/15  Yes Mary-Margaret Hassell Done, FNP  metoprolol succinate (TOPROL-XL) 50 MG 24 hr tablet Take 1 tablet (50 mg total) by mouth every morning. 02/03/15  Yes Mary-Margaret Hassell Done, FNP  ondansetron (ZOFRAN) 4 MG tablet  Take 1 tablet (4 mg total) by mouth every 8 (eight) hours as needed for nausea or vomiting. 02/03/15  Yes Mary-Margaret Hassell Done, FNP  potassium chloride SA (K-DUR,KLOR-CON) 20 MEQ tablet Take 1 tablet (20 mEq total) by mouth 2 (two) times daily. 02/03/15  Yes Mary-Margaret Hassell Done, FNP  atorvastatin (LIPITOR) 40 MG tablet Take 1 tablet (40 mg total) by mouth every evening. Patient not taking: Reported on 06/21/2015 02/03/15   Mary-Margaret Hassell Done, FNP  Blood Glucose Monitoring Suppl (ONETOUCH VERIO) w/Device KIT 1 each by Does not apply route 2 (two) times daily. Dx E11.65 05/21/15   Cassandria Anger, MD  clindamycin (CLEOCIN) 150 MG capsule Take 2 capsules (300 mg total) by mouth 3 (three) times daily. Patient not taking: Reported on 06/21/2015 05/15/15   Davonna Belling, MD  glucose blood [facility] VERIO) test strip Test 2 x daily and prn  E11.9 05/21/15   Cassandria Anger, MD  HYDROcodone-acetaminophen (NORCO) 5-325 MG tablet Take 1 tablet by mouth every 6 (six) hours as needed for moderate pain. Patient not taking: Reported on 06/21/2015 04/07/15   Shawn M Rayburn, PA-C   Insulin Pen Needle (B-D ULTRAFINE III SHORT PEN) 31G X 8 MM MISC 1 each by Does not apply route as directed. 05/14/15   Cassandria Anger, MD  [facility] DELICA LANCETS 03B MISC Test one time per day and prn 02/28/15   Mary-Margaret Hassell Done, FNP  pantoprazole (PROTONIX) 40 MG tablet Take 1 tablet (40 mg total) by mouth 2 (two) times daily. Patient not taking: Reported on 06/21/2015 02/03/15   Mary-Margaret Hassell Done, FNP  pregabalin (LYRICA) 50 MG capsule Take 1 capsule (50 mg total) by mouth 2 (two) times daily. Patient not taking: Reported on 06/21/2015 06/03/15   Melvenia Beam, MD  UNIFINE PENTIPS 32G X 4 MM MISC  05/14/15   Historical Provider, MD   BP 195/74 mmHg  Pulse 59  Temp(Src) 98.4 F (36.9 C) (Oral)  Ht _0  (1.575 m)  Wt 155 lb (70.308 kg)  BMI 28.34 kg/m2  SpO2 100% Physical Exam  Constitutional: She is oriented to person, place, and time. She appears well-developed.  HENT:  Head: Normocephalic.  Eyes: Conjunctivae and EOM are normal. No scleral icterus.  Neck: Neck supple. No thyromegaly present.  Cardiovascular: Normal rate and regular rhythm.  Exam reveals no gallop and no friction rub.   No murmur heard. Pulmonary/Chest: No stridor. She has no wheezes. She has no rales. She exhibits no tenderness.  Abdominal: She exhibits no distension. There is no tenderness. There is no rebound.  Musculoskeletal: Normal range of motion. She exhibits no edema.  Lymphadenopathy:    She has no cervical adenopathy.  Neurological: She is oriented to person, place, and time. She exhibits normal muscle tone. Coordination normal.  Skin: No rash noted. No erythema.  Psychiatric: She has a normal mood and affect. Her behavior is normal.    ED Course  Procedures (including critical care time) Labs Review Labs Reviewed  BASIC METABOLIC PANEL - Abnormal; Notable for the following:    Glucose, Bld 154 (*)    BUN 24 (*)    Creatinine, Ser 1.34 (*)    GFR calc non Af Amer 39 (*)    GFR  calc Af Amer 45 (*)    All other components within normal limits  CBG MONITORING, ED - Abnormal; Notable for the following:    Glucose-Capillary 142 (*)    All other components within normal limits  CBC WITH DIFFERENTIAL/PLATELET  Imaging Review No results found. I have personally reviewed and evaluated these images and lab results as part of my medical decision-making.   EKG Interpretation None      MDM   Final diagnoses:  HTN (hypertension), benign    Hypertension poorly controlled. Patient is to record her blood pressure daily in the morning. She is to follow-up with her PCP next week to see if any change in blood pressure medicine as necessary    Milton Ferguson, MD 06/21/15 1534

## 2015-06-23 ENCOUNTER — Ambulatory Visit (INDEPENDENT_AMBULATORY_CARE_PROVIDER_SITE_OTHER): Payer: Medicare Other | Admitting: Pediatrics

## 2015-06-23 ENCOUNTER — Encounter: Payer: Self-pay | Admitting: Pediatrics

## 2015-06-23 VITALS — BP 163/72 | HR 64 | Temp 97.4°F | Ht 62.0 in | Wt 155.8 lb

## 2015-06-23 DIAGNOSIS — E876 Hypokalemia: Secondary | ICD-10-CM

## 2015-06-23 DIAGNOSIS — L89611 Pressure ulcer of right heel, stage 1: Secondary | ICD-10-CM | POA: Diagnosis not present

## 2015-06-23 DIAGNOSIS — S31813A Puncture wound without foreign body of right buttock, initial encounter: Secondary | ICD-10-CM | POA: Diagnosis not present

## 2015-06-23 DIAGNOSIS — I10 Essential (primary) hypertension: Secondary | ICD-10-CM

## 2015-06-23 DIAGNOSIS — T8131XA Disruption of external operation (surgical) wound, not elsewhere classified, initial encounter: Secondary | ICD-10-CM | POA: Diagnosis not present

## 2015-06-23 DIAGNOSIS — K604 Rectal fistula: Secondary | ICD-10-CM | POA: Diagnosis not present

## 2015-06-23 DIAGNOSIS — N184 Chronic kidney disease, stage 4 (severe): Secondary | ICD-10-CM

## 2015-06-23 DIAGNOSIS — E1122 Type 2 diabetes mellitus with diabetic chronic kidney disease: Secondary | ICD-10-CM

## 2015-06-23 DIAGNOSIS — E1121 Type 2 diabetes mellitus with diabetic nephropathy: Secondary | ICD-10-CM | POA: Diagnosis not present

## 2015-06-23 MED ORDER — POTASSIUM CHLORIDE CRYS ER 20 MEQ PO TBCR: 20.0000 meq | EXTENDED_RELEASE_TABLET | Freq: Every day | ORAL | Status: DC

## 2015-06-23 MED ORDER — LOSARTAN POTASSIUM 100 MG PO TABS: 100.0000 mg | ORAL_TABLET | Freq: Every morning | ORAL | Status: DC

## 2015-06-23 NOTE — Patient Instructions (Signed)
Stop clonidine Take metoprolol once a day in the morning Start taking losartan once a day in the morning  Check your blood pressure once a day and call me in two weeks with what your blood pressure numbers have been.

## 2015-06-23 NOTE — Telephone Encounter (Signed)
Patient was seen on 1/30

## 2015-06-23 NOTE — Progress Notes (Signed)
Subjective:    Patient ID: [name], female    DOB: [birth year], 70 y.o.   MRN: [id number]  CC: Hypertension   HPI: [name] is a 70 y.o. female presenting for Hypertension  BP last week during [facility] PT was 180s Yesterday was elevated up to 160s At home with machine pt says it fluctuates between 120s-160s-170s. Has occasionally had a dull headache over past couple of weeks. No chest pain, no SOB, no blurry vision.  BGLs to 40s 2 months ago, glimeperide was stopped at that time  Takes clonidine 0.1mg  BID, metoprolol 50mg  at night Was taken off losartan 2-3 yrs ago though is still on med lists and per chart review other providers have thought she is taking it. Is taking K supplementation twice a day  Has a chronic decubitus ulcer/wound followed in wound clinic.     Depression screen [facility] 2/9 06/23/2015 06/04/2015 05/30/2015 05/14/2015 10/11/2014  Decreased Interest 0 0 0 0 0  Down, Depressed, Hopeless 0 0 0 0 0  PHQ - 2 Score 0 0 0 0 0  Altered sleeping - - - - -  Tired, decreased energy - - - - -  Change in appetite - - - - -  Feeling bad or failure about yourself  - - - - -  Trouble concentrating - - - - -  Moving slowly or fidgety/restless - - - - -  Suicidal thoughts - - - - -  PHQ-9 Score - - - - -     Relevant past medical, surgical, family and social history reviewed and updated as indicated. Interim medical history since our last visit reviewed. Allergies and medications reviewed and updated.    ROS: Per HPI unless specifically indicated above  History  Smoking status  . Never Smoker   Smokeless tobacco  . Never Used    Past Medical History Patient Active Problem List   Diagnosis Date Noted  . Other specified hypothyroidism 05/30/2015  . Nausea and vomiting 01/23/2015  . Acute encephalopathy 01/23/2015  . CKD (chronic kidney disease), stage III   . Anemia in chronic renal disease   . Gastroesophageal reflux disease without esophagitis  10/11/2014  . Hypokalemia 10/11/2014  . Hypothyroidism 05/18/2014  . Hip fracture requiring operative repair ([place])   . Fall   . Osteoporosis 09/06/2013  . Type 2 diabetes mellitus with stage 4 chronic kidney disease ([place]) 07/20/2013  . Edema 03/03/2011  . Obesity 02/10/2011  . Diabetic retinopathy ([place]) 10/28/2007  . Hyperlipidemia 10/28/2007  . Anxiety state 10/28/2007  . Depression 10/28/2007  . Essential hypertension 10/28/2007  . Disorder resulting from impaired renal function 10/28/2007  . COLONIC POLYPS, HYPERPLASTIC 11/17/2006  . DIVERTICULOSIS, COLON 11/17/2006        Objective:    BP 163/72 mmHg  Pulse 64  Temp(Src) 97.4 F (36.3 C) (Oral)  Ht 5\' 2"  (1.575 m)  Wt 155 lb 12.8 oz (70.67 kg)  BMI 28.49 kg/m2  Wt Readings from Last 3 Encounters:  06/23/15 155 lb 12.8 oz (70.67 kg)  06/21/15 155 lb (70.308 kg)  06/20/15 155 lb (70.308 kg)    Gen: NAD, alert, cooperative with exam, NCAT EYES: EOMI, no scleral injection or icterus ENT:   OP without erythema LYMPH: no cervical LAD CV: NRRR, normal S1/S2 Resp: CTABL, no wheezes, normal WOB Abd: +BS, soft, NTND.  Ext: No edema, warm Neuro: Alert and oriented     Assessment & Plan:    [name] was seen  today for hypertension and multiple med problem f/u.  Diagnoses and all orders for this visit:  Essential hypertension Pt on clonidine and metoprolol alone for BP management. Per chart review other providers have thought that she is taking losartan though pt says she hasnt had it for over 2 years. She does have CKD, Cr 1.3-1.7 baseline, most recently 1.34 two days ago. Pt also with diabetes. Will restart losartan, pt seeing her nephrologist later this week for labs. If needed can use amlodipine instead or in addition to losartan. WIll stop clonidine. Fluctuating BPs likely related to rebound HTN of clonidine while on both clonidine and metoprolol. Pt to call me in two weeks with home bP readings. Planning to get new  cuff, old cuff is apprx 82mmHg higher than ours on SBP check. -     losartan (COZAAR) 100 MG tablet; Take 1 tablet (100 mg total) by mouth every morning.  Hypokalemia Period of hypokalemia following surgery for wound in 09/2014. Has been on K supplementation since, though has had several K values around or just over 5. Pt is taking lasix daily for CKD and likely diastolic dysfunction though last ECHo was from 2012. Will decrease to once a day K supplementation. May need to discontinue. -     potassium chloride SA (K-DUR,KLOR-CON) 20 MEQ tablet; Take 1 tablet (20 mEq total) by mouth daily.  Type 2 diabetes mellitus with stage 4 chronic kidney disease, without long-term current use of insulin ([place]) Followed by endocrine. Stopped glimeperide recently due to low BGL, likely related to worsening renal function. BGLs stable at home per pt.   Follow up plan: Return in about 8 weeks (around 08/18/2015).  Assunta Found, MD [facility] 06/23/2015, 3:25 PM

## 2015-06-25 ENCOUNTER — Ambulatory Visit: Payer: Medicare Other | Admitting: Pediatrics

## 2015-06-25 ENCOUNTER — Encounter: Payer: Medicare Other | Admitting: Neurology

## 2015-06-25 ENCOUNTER — Telehealth: Payer: Self-pay | Admitting: Pediatrics

## 2015-06-25 DIAGNOSIS — I5030 Unspecified diastolic (congestive) heart failure: Secondary | ICD-10-CM | POA: Diagnosis not present

## 2015-06-25 DIAGNOSIS — I130 Hypertensive heart and chronic kidney disease with heart failure and stage 1 through stage 4 chronic kidney disease, or unspecified chronic kidney disease: Secondary | ICD-10-CM | POA: Diagnosis not present

## 2015-06-25 DIAGNOSIS — K604 Rectal fistula: Secondary | ICD-10-CM | POA: Diagnosis not present

## 2015-06-25 DIAGNOSIS — R234 Changes in skin texture: Secondary | ICD-10-CM | POA: Diagnosis not present

## 2015-06-25 DIAGNOSIS — E1122 Type 2 diabetes mellitus with diabetic chronic kidney disease: Secondary | ICD-10-CM | POA: Diagnosis not present

## 2015-06-25 DIAGNOSIS — E785 Hyperlipidemia, unspecified: Secondary | ICD-10-CM

## 2015-06-25 DIAGNOSIS — E034 Atrophy of thyroid (acquired): Secondary | ICD-10-CM

## 2015-06-25 DIAGNOSIS — I10 Essential (primary) hypertension: Secondary | ICD-10-CM

## 2015-06-25 DIAGNOSIS — N183 Chronic kidney disease, stage 3 (moderate): Secondary | ICD-10-CM | POA: Diagnosis not present

## 2015-06-25 MED ORDER — AMLODIPINE BESYLATE 5 MG PO TABS: 5.0000 mg | ORAL_TABLET | Freq: Every day | ORAL | Status: DC

## 2015-06-25 NOTE — Telephone Encounter (Signed)
I sent in additional BP medicine called amlodipine. She should take 5mg , or one tab, once a day in the morning with her other medications. So for her BP, she shoul dbe taking losartan 100mg  once in morning, Metoprolol succinate 50mg  once in the morning, and also amlodipine 5mg . OK to take all at the same time.

## 2015-06-25 NOTE — Telephone Encounter (Signed)
Called pt with Dr Autumn Patty recommendation Verbalizes understanding

## 2015-06-25 NOTE — Telephone Encounter (Signed)
Patient called stating that BP is still elevated 192/81 yesterday 01/31 and 160/91, 180/91 today 02/01

## 2015-06-26 DIAGNOSIS — N184 Chronic kidney disease, stage 4 (severe): Secondary | ICD-10-CM | POA: Diagnosis not present

## 2015-06-26 DIAGNOSIS — D631 Anemia in chronic kidney disease: Secondary | ICD-10-CM | POA: Diagnosis not present

## 2015-06-26 DIAGNOSIS — I129 Hypertensive chronic kidney disease with stage 1 through stage 4 chronic kidney disease, or unspecified chronic kidney disease: Secondary | ICD-10-CM | POA: Diagnosis not present

## 2015-06-26 DIAGNOSIS — N183 Chronic kidney disease, stage 3 (moderate): Secondary | ICD-10-CM | POA: Diagnosis not present

## 2015-06-26 DIAGNOSIS — N2581 Secondary hyperparathyroidism of renal origin: Secondary | ICD-10-CM | POA: Diagnosis not present

## 2015-06-26 DIAGNOSIS — R809 Proteinuria, unspecified: Secondary | ICD-10-CM | POA: Diagnosis not present

## 2015-06-27 ENCOUNTER — Encounter: Payer: Medicare Other | Attending: Internal Medicine | Admitting: Nutrition

## 2015-06-27 ENCOUNTER — Encounter: Payer: Self-pay | Admitting: "Endocrinology

## 2015-06-27 ENCOUNTER — Ambulatory Visit (INDEPENDENT_AMBULATORY_CARE_PROVIDER_SITE_OTHER): Payer: Medicare Other | Admitting: "Endocrinology

## 2015-06-27 VITALS — BP 133/81 | HR 72 | Ht 62.0 in

## 2015-06-27 VITALS — Ht 62.0 in | Wt 152.3 lb

## 2015-06-27 DIAGNOSIS — N184 Chronic kidney disease, stage 4 (severe): Secondary | ICD-10-CM | POA: Insufficient documentation

## 2015-06-27 DIAGNOSIS — E1165 Type 2 diabetes mellitus with hyperglycemia: Secondary | ICD-10-CM

## 2015-06-27 DIAGNOSIS — I10 Essential (primary) hypertension: Secondary | ICD-10-CM

## 2015-06-27 DIAGNOSIS — E118 Type 2 diabetes mellitus with unspecified complications: Secondary | ICD-10-CM

## 2015-06-27 DIAGNOSIS — E161 Other hypoglycemia: Secondary | ICD-10-CM | POA: Diagnosis not present

## 2015-06-27 DIAGNOSIS — E1122 Type 2 diabetes mellitus with diabetic chronic kidney disease: Secondary | ICD-10-CM | POA: Insufficient documentation

## 2015-06-27 DIAGNOSIS — Z794 Long term (current) use of insulin: Secondary | ICD-10-CM

## 2015-06-27 DIAGNOSIS — IMO0002 Reserved for concepts with insufficient information to code with codable children: Secondary | ICD-10-CM

## 2015-06-27 DIAGNOSIS — E039 Hypothyroidism, unspecified: Secondary | ICD-10-CM | POA: Diagnosis not present

## 2015-06-27 DIAGNOSIS — E785 Hyperlipidemia, unspecified: Secondary | ICD-10-CM

## 2015-06-27 NOTE — Patient Instructions (Signed)

## 2015-06-27 NOTE — Patient Instructions (Signed)
Goals Keep up the good work!!! 1. Follow Plate Method 2. Eat 3 carb choices per meal Eat a snack if needed about 7 pm if eating dinner early to prevent low blood sugars  (yogurt and fruit or PB and crackers or bowl of cereal and milk) 3. Chair exercises 5 minutes a day 4. Increase low carb veggies with lunch and dinner meals. 5. Get A1C down to 8%.

## 2015-06-27 NOTE — Progress Notes (Signed)
  Medical Nutrition Therapy:  Appt start time: 1100  end time:  1130 Assessment:  Primary concerns today: Diabetes Type 2.. Blood sugars are doing much Better. BS are in the 140's in the am. She has been eating better balanced meals, cutting out snacks and eating right portions. Taking 70/30 units of insulin 10 units in and  Evening dose was reduced to 10 units in pm today.Marland Kitchen Has had a occassionally low blood sugar that her husband treats with juice or something sweet. Walks with a walker. Not able to exercise much. Meter downloaded.  Tends to eat dinner early in afternoon and then doesn't eat again before bed causing blood sugars to be a little low in am. BS was 56 this am.   Lab Results  Component Value Date   HGBA1C 10.0* 04/02/2015    A1 Preferred Learning Style:   No preference indicated    Learning Readiness: Not ready  Contemplating  Ready  Change in progress   MEDICATIONS: See list   DIETARY INTAKE:  24-hr recall:  B ( AM): eggs, 2 slices with toast, sausage or bacon,  Or oatmeal, toast and egg, or fruit Snk ( AM):   L ( PM): chicken salad  Sandwich, raisins,, water. Snk ( PM):  D ( PM):Pinto beans  and baked potato and bread and rasins Snk ( PM): sf jello Beverages: water  Usual physical activity: Limited in wheelchair. Can do chair exercises.  Estimated energy needs: 1200 calories 135 g carbohydrates 90 g protein 33 g fat  Progress Towards Goal(s):  In progress.   Nutritional Diagnosis:  NB-1.1 Food and nutrition-related knowledge deficit As related to Dm.  As evidenced by A1C 10%.    Intervention:  Nutrition and Diabetes education provided on My Plate, CHO counting, meal planning, portion sizes, timing of meals, avoiding snacks between meals unless having a low blood sugar, target ranges for A1C and blood sugars, signs/symptoms and treatment of hyper/hypoglycemia, monitoring blood sugars, taking medications as prescribed, benefits of exercising 30 minutes  per day and prevention of complications of DM.  Goals Keep up the good work!!! 1. Follow Plate Method 2. Eat 3 carb choices per meal Eat a snack if needed about 7 pm if eating dinner early to prevent low blood sugars  (yogurt and fruit or PB and crackers or bowl of cereal and milk) 3. Chair exercises 5 minutes a day 4. Increase low carb veggies with lunch and dinner meals. 5. Get A1C down to 8%.   Teaching Method Utilized:  Visual Auditory Hands on  Handouts given during visit include:  The Plate Method   Meal Plan Card  Label Reading  Barriers to learning/adherence to lifestyle change:   Demonstrated degree of understanding via:  Teach Back   Monitoring/Evaluation:  Dietary intake, exercise, meal planning, SBG, and body weight in 3 month(s).

## 2015-06-27 NOTE — Progress Notes (Signed)
Subjective:    Patient ID: [name], female    DOB: [birth year]. Patient is to follow-up for uncontrolled type 2 diabetes with her meter and logs.  Past Medical History  Diagnosis Date  . Hypertension   . Hyperlipidemia   . GERD (gastroesophageal reflux disease)   . GAD (generalized anxiety disorder)   . Hypothyroidism   . Type II diabetes mellitus ([place])   . Right bundle branch block   . LAFB (left anterior fascicular block)   . History of rectal abscess     12-29-2004  bedside I & D  . History of GI bleed     upper 2009  due to esophagitis  &  2002  due to Mallory-Weiss tear  . History of colon polyps     benign  . Diverticulosis of colon   . History of esophagitis   . Diabetic gastroparesis ([place])   . History of gout     in issues for several years  . Sacral decubitus ulcer     since 2014- 01-02-15 remains with wound" gauze dressing changes daily.  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 Coronary artery disease   . CKD (chronic kidney disease), stage III     nephrologist--  dr Mercy Moore  . Diabetic retinopathy ([place])     bilateral --  monitored by dr [name]  . Arthritis     knees and hand/fingers. "broke back"-being evaluated for this"weakness left leg"  . Anemia in chronic renal disease     Aranesp injection --  when Hg <11, last injection 12-24-14.  Marland Kitchen Anxiety   . Constipation   . Complication of anesthesia     post-op confusion    Past Surgical History  Procedure Laterality Date  . Retinopathy surgery Bilateral 1980's?  . Orif femur fracture Left 10/09/2012    Procedure: OPEN REDUCTION INTERNAL FIXATION (ORIF) DISTAL FEMUR FRACTURE;  Surgeon: Rozanna Box, MD;  Location: [facility];  Service: Orthopedics;  Laterality: Left;  . Compression hip screw Right 05/18/2014    Procedure: COMPRESSION HIP;  Surgeon: Carole Civil, MD;  Location: [facility];  Service: Orthopedics;  Laterality: Right;  . Esophagogastroduodenoscopy  last one  01-09-2011  . Transthoracic echocardiogram  02-18-2011    mild LVH,  ef 55-60%,  grade I diastolic dysfunction/  mild TR/  RV systolic pressure increased consistant with moderate pulmonary hypertension  . Cardiovascular stress test  12-30-2004    normal perfusion study/  no ischemia or infartion/  normal LV wall function and wall motion , ef 66%  . Colonoscopy w/ polypectomy  last one 2008  . Cataract extraction w/ intraocular lens  implant, bilateral  1995  . Incision and drainage of wound N/A 09/30/2014    Procedure: IRRIGATION AND DEBRIDEMENT SACRAL WOUND, EXCISION OF PERIRECTAL TRACT WITH PLACEMENT OF ACCELL;  Surgeon: Theodoro Kos, DO;  Location: [facility];  Service: Plastics;  Laterality: N/A;  . Evaluation under anesthesia with fistulectomy N/A 01/06/2015    Procedure: EXAM UNDER ANESTHESIA , placement of seton;  Surgeon: Jackolyn Confer, MD;  Location: [facility];  Service: General;  Laterality: N/A;  . I&d extremity N/A 04/07/2015    Procedure: IRRIGATION AND DEBRIDEMENT ISCHIAL ULCER;  Surgeon: Loel Lofty Dillingham, DO;  Location: [facility];  Service: Plastics;  Laterality: N/A;  . Application of a-cell of extremity N/A 04/07/2015    Procedure: A CELL PLACMENT;  Surgeon: Loel Lofty Dillingham, DO;  Location: [facility];  Service: Plastics;  Laterality: N/A;   Social History   Social History  . Marital Status: Married    Spouse Name: [name]  . Number of Children: 0  . Years of Education: 12   Occupational History  . Retired    Social History Main Topics  . Smoking status: Never Smoker   . Smokeless tobacco: Never Used  . Alcohol Use: No  . Drug Use: No  . Sexual Activity: Not Asked   Other Topics Concern  . None   Social History Narrative   Lives with husband.    Caffeine use: none   Outpatient Encounter Prescriptions as of 06/26/1968  Medication Sig  . amLODipine (NORVASC) 5 MG tablet Take 1 tablet (5 mg total) by mouth daily.  Marland Kitchen atorvastatin (LIPITOR) 40 MG tablet  Take 1 tablet (40 mg total) by mouth every evening.  . Blood Glucose Monitoring Suppl (ONETOUCH VERIO) w/Device KIT 1 each by Does not apply route 2 (two) times daily. Dx E11.65  . calcitRIOL (ROCALTROL) 0.25 MCG capsule TAKE 1 CAPSULE ONE TIME DAILY  . furosemide (LASIX) 40 MG tablet Take 1 tablet (40 mg total) by mouth every morning.  Marland Kitchen glucose blood (ONETOUCH VERIO) test strip Test 2 x daily and prn  E11.9  . insulin aspart protamine - aspart (NOVOLOG MIX 70/30 FLEXPEN) (70-30) 100 UNIT/ML FlexPen Inject 0.1 mLs (10 Units total) into the skin 2 (two) times daily with a meal. (Patient taking differently: Inject 10-12 Units into the skin 2 (two) times daily with a meal. )  . Insulin Pen Needle (B-D ULTRAFINE III SHORT PEN) 31G X 8 MM MISC 1 each by Does not apply route as directed.  Marland Kitchen levothyroxine (SYNTHROID, LEVOTHROID) 75 MCG tablet Take 1 tablet (75 mcg total) by mouth daily before breakfast.  . lidocaine (XYLOCAINE) 5 % ointment Apply small amount (3 to 5 grams)  to affected area(s) two times a day  . losartan (COZAAR) 100 MG tablet Take 1 tablet (100 mg total) by mouth every morning.  . metoprolol succinate (TOPROL-XL) 50 MG 24 hr tablet Take 1 tablet (50 mg total) by mouth every morning.  . ondansetron (ZOFRAN) 4 MG tablet Take 1 tablet (4 mg total) by mouth every 8 (eight) hours as needed for nausea or vomiting.  Glory Rosebush DELICA LANCETS 16X MISC Test one time per day and prn  . pantoprazole (PROTONIX) 40 MG tablet Take 1 tablet (40 mg total) by mouth 2 (two) times daily.  . potassium chloride SA (K-DUR,KLOR-CON) 20 MEQ tablet Take 1 tablet (20 mEq total) by mouth daily.  . pregabalin (LYRICA) 50 MG capsule Take 1 capsule (50 mg total) by mouth 2 (two) times daily.  Marland Kitchen UNIFINE PENTIPS 32G X 4 MM MISC    No facility-administered encounter medications on file as of 06/26/1968.   ALLERGIES: Allergies  Allergen Reactions  . Aspirin Nausea And Vomiting  . Ciprofloxacin Other (See Comments)      Upset Stomach  . Codeine Nausea And Vomiting    Makes me sick   . Micardis [Telmisartan] Other (See Comments)    unknown  . Nexium [Esomeprazole Magnesium]   . Niaspan [Niacin Er]   . Onglyza [Saxagliptin]   . Other     No otc pain medications  . Rofecoxib Other (See Comments)    unknown  . Simvastatin   . Tequin [Gatifloxacin]   . Welchol [Colesevelam Hcl]   . Amoxicillin Rash   VACCINATION STATUS: Immunization History  Administered Date(s) Administered  . Influenza,inj,Quad PF,36+ Mos 02/16/2013, 02/27/2015  . Pneumococcal Conjugate-13 10/11/2014  . Pneumococcal Polysaccharide-23 10/10/2012  . Tdap 10/06/2012    Diabetes She presents for her follow-up diabetic visit. She has type 2 diabetes mellitus. Onset time: She was diagnosed at approximate age of 36 years . Her disease course has been improving. There are no hypoglycemic associated symptoms. Pertinent negatives for hypoglycemia include no confusion, headaches, pallor or seizures. Associated symptoms include fatigue and visual change. Pertinent negatives for diabetes include no chest pain, no polydipsia and no polyphagia. There are no hypoglycemic complications. Symptoms are improving. Diabetic complications include nephropathy, peripheral neuropathy, PVD and retinopathy. Risk factors for coronary artery disease include diabetes mellitus, dyslipidemia, hypertension and sedentary lifestyle. Current diabetic treatment includes oral agent (monotherapy). She is compliant with treatment most of the time. Weight trend: Wheelchair-bound. She is following a generally unhealthy diet. When asked about meal planning, she reported none. She has not had a previous visit with a dietitian. She never participates in exercise. Her home blood glucose trend is increasing steadily. Her breakfast blood glucose range is generally 110-130 mg/dl. Her dinner blood glucose range is generally 130-140 mg/dl. An ACE inhibitor/angiotensin II receptor blocker  is being taken. Eye exam is current.  Thyroid Problem Presents for initial visit. Onset time: 15 years. Symptoms include fatigue and visual change. Patient reports no cold intolerance, diarrhea, heat intolerance or palpitations. The symptoms have been stable. Past treatments include levothyroxine. The following procedures have not been performed: thyroidectomy.  Hypertension This is a chronic problem. The current episode started more than 1 year ago. Pertinent negatives include no chest pain, headaches, palpitations or shortness of breath. Past treatments include angiotensin blockers. Hypertensive end-organ damage includes PVD, retinopathy and a thyroid problem.      Review of Systems  Constitutional: Positive for fatigue. Negative for unexpected weight change.  HENT: Negative for trouble swallowing and voice change.   Eyes: Negative for visual disturbance.  Respiratory: Negative for cough, shortness of breath and wheezing.   Cardiovascular: Negative for chest pain, palpitations and leg swelling.  Gastrointestinal: Negative for nausea, vomiting and diarrhea.  Endocrine: Negative for cold intolerance, heat intolerance, polydipsia and polyphagia.  Musculoskeletal: Positive for myalgias, back pain, joint swelling, arthralgias and gait problem.       She is wheelchair-bound due to recent hip fracture and diffuse debilitating arthritis.  Skin: Negative for color change, pallor, rash and wound.  Neurological: Negative for seizures and headaches.  Psychiatric/Behavioral: Negative for suicidal ideas and confusion.    Objective:    BP 133/81 mmHg  Pulse 72  Ht _0  (1.575 m)  SpO2 100%  Wt Readings from Last 3 Encounters:  06/23/15 155 lb 12.8 oz (70.67 kg)  06/21/15 155 lb (70.308 kg)  06/20/15 155 lb (70.308 kg)    Physical Exam  Constitutional: She is oriented to person, place, and time. She appears well-developed.  HENT:  Head: Normocephalic and atraumatic.  Eyes: EOM are normal.    Neck: Normal range of motion. Neck supple. No tracheal deviation present. No thyromegaly present.  Cardiovascular: Normal rate and regular rhythm.  Exam reveals decreased pulses.   Pulses:      Dorsalis pedis pulses are 0 on the right side, and 0 on the left side.       Posterior tibial pulses are 0 on the right side, and 0 on the left side.  Pulmonary/Chest: Effort normal and breath sounds normal.  Abdominal: Soft. Bowel sounds are normal.  There is no tenderness. There is no guarding.  Musculoskeletal: She exhibits no edema.       Arms: She is wheelchair-bound due to recent hip fracture and diffuse debilitating arthritis.  Neurological: She is alert and oriented to person, place, and time. She has normal reflexes. A sensory deficit is present. No cranial nerve deficit. Coordination normal.  Skin: Skin is warm and dry. No rash noted. No erythema. No pallor.  Psychiatric: She has a normal mood and affect. Judgment normal.    Results for orders placed or performed during the hospital encounter of 06/21/15  CBC with Differential/Platelet  Result Value Ref Range   WBC 8.6 4.0 - 10.5 K/uL   RBC 4.17 3.87 - 5.11 MIL/uL   Hemoglobin 12.1 12.0 - 15.0 g/dL   HCT 38.1 36.0 - 46.0 %   MCV 91.4 78.0 - 100.0 fL   MCH 29.0 26.0 - 34.0 pg   MCHC 31.8 30.0 - 36.0 g/dL   RDW 13.9 11.5 - 15.5 %   Platelets 209 150 - 400 K/uL   Neutrophils Relative % 69 %   Neutro Abs 5.9 1.7 - 7.7 K/uL   Lymphocytes Relative 24 %   Lymphs Abs 2.0 0.7 - 4.0 K/uL   Monocytes Relative 6 %   Monocytes Absolute 0.5 0.1 - 1.0 K/uL   Eosinophils Relative 1 %   Eosinophils Absolute 0.1 0.0 - 0.7 K/uL   Basophils Relative 0 %   Basophils Absolute 0.0 0.0 - 0.1 K/uL  Basic metabolic panel  Result Value Ref Range   Sodium 139 135 - 145 mmol/L   Potassium 4.9 3.5 - 5.1 mmol/L   Chloride 106 101 - 111 mmol/L   CO2 24 22 - 32 mmol/L   Glucose, Bld 154 (H) 65 - 99 mg/dL   BUN 24 (H) 6 - 20 mg/dL   Creatinine, Ser 1.34  (H) 0.44 - 1.00 mg/dL   Calcium 9.2 8.9 - 10.3 mg/dL   GFR calc non Af Amer 39 (L) >60 mL/min   GFR calc Af Amer 45 (L) >60 mL/min   Anion gap 9 5 - 15  CBG monitoring, ED  Result Value Ref Range   Glucose-Capillary 142 (H) 65 - 99 mg/dL   Diabetic Labs (most recent): Lab Results  Component Value Date   HGBA1C 10.0* 04/02/2015   HGBA1C 6.9* 01/24/2015   HGBA1C 8.8* 08/26/2014   Lipid Panel     Component Value Date/Time   CHOL 153 10/11/2014 1159   CHOL 105 09/11/2012 1057   TRIG 170* 10/11/2014 1159   TRIG 79 09/11/2012 1057   HDL 53 10/11/2014 1159   HDL 41 09/11/2012 1057   LDLCALC 50 02/14/2014 1012   LDLCALC 48 09/11/2012 1057     Assessment & Plan:   1. Type 2 diabetes mellitus with stage 4 chronic kidney disease, without long-term current use of insulin ([place])   - Patient has currently uncontrolled symptomatic type 2 DM since  70 years of age,  with most recent A1c of  10 %. Recent labs reviewed.   -her  diabetes is complicated by PAD, CKD, neuropathy and patient remains at a high risk for more acute and chronic complications of diabetes which include CAD, CVA, CKD, retinopathy, and neuropathy. These are all discussed in detail with the patient.  - I have counseled the patient on diet management  by adopting a carbohydrate restricted/protein rich diet.  - Suggestion is made for patient to avoid simple carbohydrates   from  their diet including Cakes , Desserts, Ice Cream,  Soda (  diet and regular) , Sweet Tea , Candies,  Chips, Cookies, Artificial Sweeteners,   and "Sugar-free" Products . This will help patient to have stable blood glucose profile and potentially avoid unintended weight gain.  - I encouraged the patient to switch to  unprocessed or minimally processed complex starch and increased protein intake (animal or plant source), fruits, and vegetables.  - Patient is advised to stick to a routine mealtimes to eat 3 meals  a day and avoid unnecessary snacks (  to snack only to correct hypoglycemia).  - The patient will be scheduled with Jearld Fenton, RDN, CDE for individualized DM education.  - I have approached patient with the following individualized plan to manage diabetes and patient agrees:   - She came with much better and near target glycemic profile. Notethat her last A1c was high at 10% however did not repeat it recently.   -Her husband offers to help. I will lower NovoLog 70/30  to 10 units with breakfast and 10 units with supper for pre-meal glucose above 90 mg/dL associated with monitoring of blood glucose at least twice a day before breakfast and before supper. -If she cannot perform this therapy even with the help of her husband, her best option is placement in skilled care facility.  -Patient is encouraged to call clinic for blood glucose levels less than 70 or above 300 mg /dl. - I will  discontinue   Tradjenta  for cost reasons.  -Patient is not a candidate for MTF, Incretin therapy.   - Patient will be considered for incretin therapy as appropriate next visit. - Patient specific target  A1c;  LDL, HDL, Triglycerides, and  Waist Circumference were discussed in detail.  2) BP/HTN: Controlled. Continue current medications including ACEI/ARB. 3) Lipids/HPL:  continue statins. 4)  Weight/Diet: CDE Consult will be initiated, she has limited ability to exercise. 5)  Hypothyroidism: She is clinically euthyroid - continue LT4 75 mcg po qam.  - We discussed about correct intake of levothyroxine, at fasting, with water, separated by at least 30 minutes from breakfast, and separated by more than 4 hours from calcium, iron, multivitamins, acid reflux medications (PPIs). -Patient is made aware of the fact that thyroid hormone replacement is needed for life, dose to be adjusted by periodic monitoring of thyroid function tests.  5) Chronic Care/Health Maintenance:  -Patient is  on ACEI/ARB and Statin medications and encouraged to continue  to follow up with Ophthalmology, Podiatrist at least yearly or according to recommendations, and advised to   stay away from smoking. I have recommended yearly flu vaccine and pneumonia vaccination at least every 5 years; moderate intensity exercise for up to 150 minutes weekly; and  sleep for at least 7 hours a day.  - 30 minutes of time was spent on the care of this patient , 50% of which was applied for counseling on diabetes complications and their preventions.  - Patient to bring meter and  blood glucose logs during their next visit in 2 weeks.   - I advised patient to maintain close follow up with Eustaquio Maize, MD for primary care needs.  Follow up plan: - Return in about 6 weeks (around 08/08/2015) for diabetes, high blood pressure, high cholesterol, underactive thyroid, follow up with pre-visit labs, meter, and logs.  Glade Lloyd, MD Phone: [phone]  Fax: [phone]   06/27/2015, 10:49 AM

## 2015-06-27 NOTE — Telephone Encounter (Signed)
Dr. Evette Doffing, please help? This patient wants all her meds ordered through mail order. She doesn't know what she takes. Could you possibly list the meds she should be on for me?

## 2015-06-28 ENCOUNTER — Observation Stay (HOSPITAL_COMMUNITY)
Admission: EM | Admit: 2015-06-28 | Discharge: 2015-07-01 | Disposition: A | Discharge status: Home / Self Care | Payer: Medicare Other | Attending: Internal Medicine | Admitting: Internal Medicine

## 2015-06-28 ENCOUNTER — Encounter (HOSPITAL_COMMUNITY): Payer: Self-pay | Admitting: *Deleted

## 2015-06-28 ENCOUNTER — Emergency Department (HOSPITAL_COMMUNITY): Payer: Medicare Other

## 2015-06-28 DIAGNOSIS — I951 Orthostatic hypotension: Secondary | ICD-10-CM | POA: Insufficient documentation

## 2015-06-28 DIAGNOSIS — T50905A Adverse effect of unspecified drugs, medicaments and biological substances, initial encounter: Secondary | ICD-10-CM

## 2015-06-28 DIAGNOSIS — Z872 Personal history of diseases of the skin and subcutaneous tissue: Secondary | ICD-10-CM | POA: Diagnosis not present

## 2015-06-28 DIAGNOSIS — T465X5A Adverse effect of other antihypertensive drugs, initial encounter: Secondary | ICD-10-CM | POA: Diagnosis not present

## 2015-06-28 DIAGNOSIS — I129 Hypertensive chronic kidney disease with stage 1 through stage 4 chronic kidney disease, or unspecified chronic kidney disease: Secondary | ICD-10-CM | POA: Insufficient documentation

## 2015-06-28 DIAGNOSIS — I152 Hypertension secondary to endocrine disorders: Secondary | ICD-10-CM | POA: Diagnosis present

## 2015-06-28 DIAGNOSIS — E11649 Type 2 diabetes mellitus with hypoglycemia without coma: Principal | ICD-10-CM | POA: Insufficient documentation

## 2015-06-28 DIAGNOSIS — Z79899 Other long term (current) drug therapy: Secondary | ICD-10-CM | POA: Diagnosis not present

## 2015-06-28 DIAGNOSIS — N183 Chronic kidney disease, stage 3 unspecified: Secondary | ICD-10-CM | POA: Diagnosis present

## 2015-06-28 DIAGNOSIS — E1159 Type 2 diabetes mellitus with other circulatory complications: Secondary | ICD-10-CM

## 2015-06-28 DIAGNOSIS — E1122 Type 2 diabetes mellitus with diabetic chronic kidney disease: Secondary | ICD-10-CM

## 2015-06-28 DIAGNOSIS — Z794 Long term (current) use of insulin: Secondary | ICD-10-CM | POA: Diagnosis present

## 2015-06-28 DIAGNOSIS — N184 Chronic kidney disease, stage 4 (severe): Secondary | ICD-10-CM

## 2015-06-28 DIAGNOSIS — E039 Hypothyroidism, unspecified: Secondary | ICD-10-CM | POA: Insufficient documentation

## 2015-06-28 DIAGNOSIS — E11319 Type 2 diabetes mellitus with unspecified diabetic retinopathy without macular edema: Secondary | ICD-10-CM | POA: Insufficient documentation

## 2015-06-28 DIAGNOSIS — E10649 Type 1 diabetes mellitus with hypoglycemia without coma: Secondary | ICD-10-CM | POA: Diagnosis not present

## 2015-06-28 DIAGNOSIS — E875 Hyperkalemia: Secondary | ICD-10-CM | POA: Diagnosis present

## 2015-06-28 DIAGNOSIS — I10 Essential (primary) hypertension: Secondary | ICD-10-CM | POA: Diagnosis not present

## 2015-06-28 DIAGNOSIS — K604 Rectal fistula: Secondary | ICD-10-CM | POA: Diagnosis not present

## 2015-06-28 DIAGNOSIS — I5030 Unspecified diastolic (congestive) heart failure: Secondary | ICD-10-CM | POA: Diagnosis not present

## 2015-06-28 DIAGNOSIS — I4510 Unspecified right bundle-branch block: Secondary | ICD-10-CM | POA: Insufficient documentation

## 2015-06-28 DIAGNOSIS — I2510 Atherosclerotic heart disease of native coronary artery without angina pectoris: Secondary | ICD-10-CM | POA: Diagnosis not present

## 2015-06-28 DIAGNOSIS — M1990 Unspecified osteoarthritis, unspecified site: Secondary | ICD-10-CM | POA: Diagnosis not present

## 2015-06-28 DIAGNOSIS — R531 Weakness: Secondary | ICD-10-CM | POA: Diagnosis not present

## 2015-06-28 DIAGNOSIS — K219 Gastro-esophageal reflux disease without esophagitis: Secondary | ICD-10-CM | POA: Diagnosis not present

## 2015-06-28 DIAGNOSIS — Z880 Allergy status to penicillin: Secondary | ICD-10-CM | POA: Insufficient documentation

## 2015-06-28 DIAGNOSIS — E162 Hypoglycemia, unspecified: Secondary | ICD-10-CM | POA: Diagnosis not present

## 2015-06-28 DIAGNOSIS — F411 Generalized anxiety disorder: Secondary | ICD-10-CM | POA: Insufficient documentation

## 2015-06-28 DIAGNOSIS — E1143 Type 2 diabetes mellitus with diabetic autonomic (poly)neuropathy: Secondary | ICD-10-CM | POA: Insufficient documentation

## 2015-06-28 DIAGNOSIS — E161 Other hypoglycemia: Secondary | ICD-10-CM | POA: Diagnosis not present

## 2015-06-28 DIAGNOSIS — E785 Hyperlipidemia, unspecified: Secondary | ICD-10-CM | POA: Insufficient documentation

## 2015-06-28 DIAGNOSIS — I130 Hypertensive heart and chronic kidney disease with heart failure and stage 1 through stage 4 chronic kidney disease, or unspecified chronic kidney disease: Secondary | ICD-10-CM | POA: Diagnosis not present

## 2015-06-28 DIAGNOSIS — K5730 Diverticulosis of large intestine without perforation or abscess without bleeding: Secondary | ICD-10-CM | POA: Diagnosis not present

## 2015-06-28 DIAGNOSIS — R234 Changes in skin texture: Secondary | ICD-10-CM | POA: Diagnosis not present

## 2015-06-28 DIAGNOSIS — T887XXA Unspecified adverse effect of drug or medicament, initial encounter: Secondary | ICD-10-CM | POA: Diagnosis not present

## 2015-06-28 DIAGNOSIS — K5900 Constipation, unspecified: Secondary | ICD-10-CM | POA: Diagnosis not present

## 2015-06-28 DIAGNOSIS — R7309 Other abnormal glucose: Secondary | ICD-10-CM | POA: Diagnosis not present

## 2015-06-28 LAB — CBC WITH DIFFERENTIAL/PLATELET
BASOS ABS: 0 10*3/uL (ref 0.0–0.1)
BASOS PCT: 0 %
EOS ABS: 0.2 10*3/uL (ref 0.0–0.7)
EOS PCT: 2 %
HCT: 37.7 % (ref 36.0–46.0)
Hemoglobin: 12.1 g/dL (ref 12.0–15.0)
Lymphocytes Relative: 25 %
Lymphs Abs: 2.8 10*3/uL (ref 0.7–4.0)
MCH: 29.7 pg (ref 26.0–34.0)
MCHC: 32.1 g/dL (ref 30.0–36.0)
MCV: 92.4 fL (ref 78.0–100.0)
MONO ABS: 0.9 10*3/uL (ref 0.1–1.0)
Monocytes Relative: 8 %
Neutro Abs: 7.2 10*3/uL (ref 1.7–7.7)
Neutrophils Relative %: 65 %
PLATELETS: 207 10*3/uL (ref 150–400)
RBC: 4.08 MIL/uL (ref 3.87–5.11)
RDW: 14 % (ref 11.5–15.5)
WBC: 11.1 10*3/uL — ABNORMAL HIGH (ref 4.0–10.5)

## 2015-06-28 LAB — CBG MONITORING, ED
GLUCOSE-CAPILLARY: 94 mg/dL (ref 65–99)
GLUCOSE-CAPILLARY: 72 mg/dL (ref 65–99)
Glucose-Capillary: 98 mg/dL (ref 65–99)

## 2015-06-28 LAB — GLUCOSE, CAPILLARY: GLUCOSE-CAPILLARY: 92 mg/dL (ref 65–99)

## 2015-06-28 LAB — TROPONIN I: TROPONIN I: 0.03 ng/mL (ref <0.031)

## 2015-06-28 LAB — URINALYSIS, ROUTINE W REFLEX MICROSCOPIC
Bilirubin Urine: NEGATIVE
GLUCOSE, UA: NEGATIVE mg/dL
KETONES UR: NEGATIVE mg/dL
Nitrite: NEGATIVE
PROTEIN: NEGATIVE mg/dL
Specific Gravity, Urine: 1.02 (ref 1.005–1.030)
pH: 5.5 (ref 5.0–8.0)

## 2015-06-28 LAB — BASIC METABOLIC PANEL
Anion gap: 10 (ref 5–15)
BUN: 36 mg/dL — AB (ref 6–20)
CALCIUM: 9.1 mg/dL (ref 8.9–10.3)
CO2: 23 mmol/L (ref 22–32)
Chloride: 108 mmol/L (ref 101–111)
Creatinine, Ser: 1.58 mg/dL — ABNORMAL HIGH (ref 0.44–1.00)
GFR calc Af Amer: 37 mL/min — ABNORMAL LOW (ref >60)
GFR, EST NON AFRICAN AMERICAN: 32 mL/min — AB (ref >60)
Glucose, Bld: 62 mg/dL — ABNORMAL LOW (ref 65–99)
POTASSIUM: 5.4 mmol/L — AB (ref 3.5–5.1)
Sodium: 141 mmol/L (ref 135–145)

## 2015-06-28 LAB — LACTIC ACID, PLASMA
LACTIC ACID, VENOUS: 1.7 mmol/L (ref 0.5–2.0)
LACTIC ACID, VENOUS: 1.4 mmol/L (ref 0.5–2.0)

## 2015-06-28 LAB — URINE MICROSCOPIC-ADD ON: RBC / HPF: NONE SEEN RBC/hpf (ref 0–5)

## 2015-06-28 MED ORDER — ACETAMINOPHEN 650 MG RE SUPP: 650.0000 mg | Freq: Four times a day (QID) | RECTAL | Status: DC | PRN

## 2015-06-28 MED ORDER — ALUM & MAG HYDROXIDE-SIMETH 200-200-20 MG/5ML PO SUSP: 30.0000 mL | Freq: Four times a day (QID) | ORAL | Status: DC | PRN

## 2015-06-28 MED ORDER — LEVOTHYROXINE SODIUM 75 MCG PO TABS
75.0000 ug | ORAL_TABLET | Freq: Every day | ORAL | Status: DC
  Administered 2015-06-29 – 2015-07-01 (×3): 75 ug via ORAL
  Filled 2015-06-28 (×3): qty 1

## 2015-06-28 MED ORDER — METOPROLOL SUCCINATE ER 50 MG PO TB24
50.0000 mg | ORAL_TABLET | Freq: Every morning | ORAL | Status: DC
  Administered 2015-06-29 – 2015-07-01 (×2): 50 mg via ORAL
  Filled 2015-06-28 (×2): qty 1

## 2015-06-28 MED ORDER — POLYETHYLENE GLYCOL 3350 17 G PO PACK
17.0000 g | PACK | Freq: Every day | ORAL | Status: DC | PRN
Start: 2015-06-28 — End: 2015-07-01

## 2015-06-28 MED ORDER — PREGABALIN 50 MG PO CAPS
50.0000 mg | ORAL_CAPSULE | Freq: Two times a day (BID) | ORAL | Status: DC
  Administered 2015-06-28 – 2015-07-01 (×6): 50 mg via ORAL
  Filled 2015-06-28 (×6): qty 1

## 2015-06-28 MED ORDER — ATORVASTATIN CALCIUM 40 MG PO TABS
40.0000 mg | ORAL_TABLET | Freq: Every evening | ORAL | Status: DC
  Administered 2015-06-28 – 2015-06-30 (×3): 40 mg via ORAL
  Filled 2015-06-28 (×4): qty 1

## 2015-06-28 MED ORDER — ONDANSETRON HCL 4 MG PO TABS: 4.0000 mg | ORAL_TABLET | Freq: Four times a day (QID) | ORAL | Status: DC | PRN

## 2015-06-28 MED ORDER — FUROSEMIDE 40 MG PO TABS
40.0000 mg | ORAL_TABLET | Freq: Every morning | ORAL | Status: DC
  Administered 2015-06-29 – 2015-06-30 (×2): 40 mg via ORAL
  Filled 2015-06-28 (×2): qty 1

## 2015-06-28 MED ORDER — LIDOCAINE 5 % EX OINT
TOPICAL_OINTMENT | Freq: Two times a day (BID) | CUTANEOUS | Status: DC | PRN
  Filled 2015-06-28: qty 35.44

## 2015-06-28 MED ORDER — INSULIN ASPART PROT & ASPART (70-30 MIX) 100 UNIT/ML PEN
10.0000 [IU] | PEN_INJECTOR | Freq: Two times a day (BID) | SUBCUTANEOUS | Status: DC
  Filled 2015-06-28: qty 3

## 2015-06-28 MED ORDER — ONDANSETRON HCL 4 MG/2ML IJ SOLN: 4.0000 mg | Freq: Four times a day (QID) | INTRAMUSCULAR | Status: DC | PRN

## 2015-06-28 MED ORDER — SODIUM CHLORIDE 0.9 % IV SOLN
INTRAVENOUS | Status: DC
  Administered 2015-06-28 – 2015-06-30 (×4): via INTRAVENOUS

## 2015-06-28 MED ORDER — INSULIN ASPART 100 UNIT/ML ~~LOC~~ SOLN
0.0000 [IU] | Freq: Three times a day (TID) | SUBCUTANEOUS | Status: DC
  Administered 2015-06-30: 1 [IU] via SUBCUTANEOUS
  Administered 2015-06-30: 2 [IU] via SUBCUTANEOUS
  Administered 2015-07-01: 5 [IU] via SUBCUTANEOUS

## 2015-06-28 MED ORDER — PANTOPRAZOLE SODIUM 40 MG PO TBEC
40.0000 mg | DELAYED_RELEASE_TABLET | Freq: Two times a day (BID) | ORAL | Status: DC
  Administered 2015-06-28 – 2015-07-01 (×6): 40 mg via ORAL
  Filled 2015-06-28 (×6): qty 1

## 2015-06-28 MED ORDER — ACETAMINOPHEN 325 MG PO TABS
650.0000 mg | ORAL_TABLET | Freq: Four times a day (QID) | ORAL | Status: DC | PRN
Start: 2015-06-28 — End: 2015-07-01

## 2015-06-28 MED ORDER — DEXTROSE-NACL 5-0.9 % IV SOLN: INTRAVENOUS | Status: DC

## 2015-06-28 MED ORDER — ENOXAPARIN SODIUM 40 MG/0.4ML ~~LOC~~ SOLN
40.0000 mg | SUBCUTANEOUS | Status: DC
  Administered 2015-06-28: 40 mg via SUBCUTANEOUS
  Filled 2015-06-28: qty 0.4

## 2015-06-28 MED ORDER — AMLODIPINE BESYLATE 5 MG PO TABS
5.0000 mg | ORAL_TABLET | Freq: Every day | ORAL | Status: DC
  Administered 2015-06-29 – 2015-07-01 (×2): 5 mg via ORAL
  Filled 2015-06-28 (×2): qty 1

## 2015-06-28 MED ORDER — SODIUM CHLORIDE 0.9 % IV BOLUS (SEPSIS)
500.0000 mL | Freq: Once | INTRAVENOUS | Status: AC
  Administered 2015-06-28: 500 mL via INTRAVENOUS

## 2015-06-28 NOTE — H&P (Signed)
History and Physical  [name] FTN:[id number] DOB: 11-Jul-1945 DOA: 06/28/2015  Referring physician: Dr Thurnell Garbe, ED physician PCP: Eustaquio Maize, MD   Chief Complaint: low blood sugar  HPI: [name] is a 70 y.o. female  With a history of type 2 diabetes, GERD, hypothyroidism, hypertension, chronic kidney disease stage III, coronary artery disease. Patient was seen by her primary care physician, who noted elevated blood pressure in the 915W systolically. Patient was started on losartan 100 mg daily and amlodipine 5 mg and discontinued her clonidine. Since that time, the patient has had several episodes of low blood sugars to the 40s. Patient had called EMS E couple times yesterday and she was given glucagon with improvement of her blood sugar.  Additionally, during that time, he was noted that she was mildly hypotensive. Today, her blood sugars drop throughout the day and she called EMS again. Patient was brought to the hospital due to her low blood sugar - which was 42 when EMS arrived at her home. When her blood sugars dropped, she did feel lightheaded, confused, tremulous, with spur slurred speech. The symptoms improved with her blood sugar being more elevated.   Review of Systems:   Pt denies any fevers, chills, nausea, vomiting, diarrhea, constipation, abdominal pain, shortness of breath, dyspnea on exertion, orthopnea, cough, wheezing, palpitations, headache, vision changes, lightheadedness, dizziness, diarrhea, constipation, melena, rectal bleeding.  Review of systems are otherwise negative  Past Medical History  Diagnosis Date  . Hypertension   . Hyperlipidemia   . GERD (gastroesophageal reflux disease)   . GAD (generalized anxiety disorder)   . Hypothyroidism   . Type II diabetes mellitus ([place])   . Right bundle branch block   . LAFB (left anterior fascicular block)   . History of rectal abscess     12-29-2004  bedside I & D  . History of GI bleed     upper 2009   due to esophagitis  &  2002  due to Mallory-Weiss tear  . History of colon polyps     benign  . Diverticulosis of colon   . History of esophagitis   . Diabetic gastroparesis ([place])   . History of gout     in issues for several years  . Sacral decubitus ulcer     since 2014- 01-02-15 remains with wound" gauze dressing changes daily.  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 Coronary artery disease   . CKD (chronic kidney disease), stage III     nephrologist--  dr Mercy Moore  . Diabetic retinopathy ([place])     bilateral --  monitored by dr [name]  . Arthritis     knees and hand/fingers. "broke back"-being evaluated for this"weakness left leg"  . Anemia in chronic renal disease     Aranesp injection --  when Hg <11, last injection 12-24-14.  Marland Kitchen Anxiety   . Constipation   . Complication of anesthesia     post-op confusion    Past Surgical History  Procedure Laterality Date  . Retinopathy surgery Bilateral 1980's?  . Orif femur fracture Left 10/09/2012    Procedure: OPEN REDUCTION INTERNAL FIXATION (ORIF) DISTAL FEMUR FRACTURE;  Surgeon: Rozanna Box, MD;  Location: [facility];  Service: Orthopedics;  Laterality: Left;  . Compression hip screw Right 05/18/2014    Procedure: COMPRESSION HIP;  Surgeon: Carole Civil, MD;  Location: [facility];  Service: Orthopedics;  Laterality: Right;  . Esophagogastroduodenoscopy  last one 01-09-2011  . Transthoracic echocardiogram  02-18-2011    mild LVH,  ef 55-60%,  grade I diastolic dysfunction/  mild TR/  RV systolic pressure increased consistant with moderate pulmonary hypertension  . Cardiovascular stress test  12-30-2004    normal perfusion study/  no ischemia or infartion/  normal LV wall function and wall motion , ef 66%  . Colonoscopy w/ polypectomy  last one 2008  . Cataract extraction w/ intraocular lens  implant, bilateral  1995  . Incision and drainage of wound N/A 09/30/2014    Procedure: IRRIGATION AND DEBRIDEMENT  SACRAL WOUND, EXCISION OF PERIRECTAL TRACT WITH PLACEMENT OF ACCELL;  Surgeon: Theodoro Kos, DO;  Location: [facility];  Service: Plastics;  Laterality: N/A;  . Evaluation under anesthesia with fistulectomy N/A 01/06/2015    Procedure: EXAM UNDER ANESTHESIA , placement of seton;  Surgeon: Jackolyn Confer, MD;  Location: [facility];  Service: General;  Laterality: N/A;  . I&d extremity N/A 04/07/2015    Procedure: IRRIGATION AND DEBRIDEMENT ISCHIAL ULCER;  Surgeon: Loel Lofty Dillingham, DO;  Location: [facility];  Service: Plastics;  Laterality: N/A;  . Application of a-cell of extremity N/A 04/07/2015    Procedure: A CELL PLACMENT;  Surgeon: Loel Lofty Dillingham, DO;  Location: [facility];  Service: Plastics;  Laterality: N/A;   Social History:  reports that she has never smoked. She has never used smokeless tobacco. She reports that she does not drink alcohol or use illicit drugs. Patient lives at home & is able to participate in activities of daily living  Allergies  Allergen Reactions  . Aspirin Nausea And Vomiting  . Ciprofloxacin Other (See Comments)    Upset Stomach  . Codeine Nausea And Vomiting    Makes me sick   . Micardis [Telmisartan] Other (See Comments)    unknown  . Nexium [Esomeprazole Magnesium]   . Niaspan [Niacin Er]   . Onglyza [Saxagliptin]   . Other     No otc pain medications  . Rofecoxib Other (See Comments)    unknown  . Simvastatin   . Tequin [Gatifloxacin]   . Welchol [Colesevelam Hcl]   . Amoxicillin Rash    Family History  Problem Relation Age of Onset  . Colon cancer Father   . Prostate cancer Father   . Diabetes Father   . Coronary artery disease Mother 24  . Heart disease Mother   . Diabetes Mother   . Coronary artery disease Sister 35  . Diabetes Sister   . Heart disease Sister   . Diabetes Maternal Grandmother   . Diabetes Sister   . Diabetes Sister   . Diabetes Sister   . Diabetes Sister   . Diabetes Sister      Prior to Admission  medications   Medication Sig Start Date End Date Taking? Authorizing Provider  amLODipine (NORVASC) 5 MG tablet Take 1 tablet (5 mg total) by mouth daily. 06/25/15  Yes Eustaquio Maize, MD  losartan (COZAAR) 100 MG tablet Take 1 tablet (100 mg total) by mouth every morning. 06/23/15  Yes Eustaquio Maize, MD  atorvastatin (LIPITOR) 40 MG tablet Take 1 tablet (40 mg total) by mouth every evening. 02/03/15   Mary-Margaret Hassell Done, FNP  Blood Glucose Monitoring Suppl (ONETOUCH VERIO) w/Device KIT 1 each by Does not apply route 2 (two) times daily. Dx E11.65 05/21/15   Cassandria Anger, MD  calcitRIOL (ROCALTROL) 0.25 MCG capsule TAKE 1 CAPSULE ONE TIME DAILY 03/07/15   Mary-Margaret  Hassell Done, FNP  cloNIDine (CATAPRES) 0.1 MG tablet BID 05/24/15   Historical Provider, MD  furosemide (LASIX) 40 MG tablet Take 1 tablet (40 mg total) by mouth every morning. 02/03/15   Mary-Margaret Hassell Done, FNP  glucose blood (ONETOUCH VERIO) test strip Test 2 x daily and prn  E11.9 05/21/15   Cassandria Anger, MD  insulin aspart protamine - aspart (NOVOLOG MIX 70/30 FLEXPEN) (70-30) 100 UNIT/ML FlexPen Inject 0.1 mLs (10 Units total) into the skin 2 (two) times daily with a meal. Patient taking differently: Inject 10-12 Units into the skin 2 (two) times daily with a meal.  05/30/15   Cassandria Anger, MD  Insulin Pen Needle (B-D ULTRAFINE III SHORT PEN) 31G X 8 MM MISC 1 each by Does not apply route as directed. 05/14/15   Cassandria Anger, MD  levothyroxine (SYNTHROID, LEVOTHROID) 75 MCG tablet Take 1 tablet (75 mcg total) by mouth daily before breakfast. 02/03/15   Mary-Margaret Hassell Done, FNP  lidocaine (XYLOCAINE) 5 % ointment Apply small amount (3 to 5 grams)  to affected area(s) two times a day 05/12/15   Mary-Margaret Hassell Done, FNP  metoprolol succinate (TOPROL-XL) 50 MG 24 hr tablet Take 1 tablet (50 mg total) by mouth every morning. 02/03/15   Mary-Margaret Hassell Done, FNP  ondansetron (ZOFRAN) 4 MG tablet Take 1 tablet  (4 mg total) by mouth every 8 (eight) hours as needed for nausea or vomiting. 02/03/15   Mary-Margaret Hassell Done, FNP  [name] DELICA LANCETS 95M MISC Test one time per day and prn 02/28/15   Mary-Margaret Hassell Done, FNP  pantoprazole (PROTONIX) 40 MG tablet Take 1 tablet (40 mg total) by mouth 2 (two) times daily. 02/03/15   Mary-Margaret Hassell Done, FNP  potassium chloride SA (K-DUR,KLOR-CON) 20 MEQ tablet Take 1 tablet (20 mEq total) by mouth daily. 06/23/15   Eustaquio Maize, MD  pregabalin (LYRICA) 50 MG capsule Take 1 capsule (50 mg total) by mouth 2 (two) times daily. 06/03/15   Melvenia Beam, MD  UNIFINE PENTIPS 32G X 4 MM MISC  05/14/15   Historical Provider, MD    Physical Exam: BP 129/70 mmHg  Pulse 62  Temp(Src) 97.7 F (36.5 C) (Oral)  Resp 18  Ht '5\' 2"'  (1.575 m)  Wt 153 lb (69.4 kg)  BMI 27.98 kg/m2  SpO2 97%  General: Elderly Caucasian female, who is very pleasant. Awake and alert and oriented x3. No acute cardiopulmonary distress.  Eyes: Pupils equal, round, reactive to light. Extraocular muscles are intact. Sclerae anicteric and noninjected.  ENT:  Moist mucosal membranes. No mucosal lesions.   Neck: Neck supple without lymphadenopathy. No carotid bruits. No masses palpated.  Cardiovascular: Regular rate with normal S1-S2 sounds. No murmurs, rubs, gallops auscultated. No JVD.  Respiratory: Good respiratory effort with no wheezes, rales, rhonchi. Lungs clear to auscultation bilaterally.  Abdomen: Obese. Soft, nontender, nondistended. Active bowel sounds. No masses or hepatosplenomegaly  Skin: Dry, warm to touch. 2+ dorsalis pedis and radial pulses. Musculoskeletal: No calf or leg pain. All major joints not erythematous nontender.  Psychiatric: Intact judgment and insight.  Neurologic: No focal neurological deficits. Cranial nerves II through XII are grossly intact.           Labs on Admission:  Basic Metabolic Panel:  Recent Labs Lab 06/28/15 1726  NA 141  K 5.4*  CL 108   CO2 23  GLUCOSE 62*  BUN 36*  CREATININE 1.58*  CALCIUM 9.1   Liver Function Tests: No results for input(s): AST, ALT,  ALKPHOS, BILITOT, PROT, ALBUMIN in the last 168 hours. No results for input(s): LIPASE, AMYLASE in the last 168 hours. No results for input(s): AMMONIA in the last 168 hours. CBC:  Recent Labs Lab 06/28/15 1726  WBC 11.1*  NEUTROABS 7.2  HGB 12.1  HCT 37.7  MCV 92.4  PLT 207   Cardiac Enzymes:  Recent Labs Lab 06/28/15 1726  TROPONINI 0.03    BNP (last 3 results) No results for input(s): BNP in the last 8760 hours.  ProBNP (last 3 results) No results for input(s): PROBNP in the last 8760 hours.  CBG:  Recent Labs Lab 06/28/15 1825 06/28/15 1931  GLUCAP 98 94    Radiological Exams on Admission: Dg Chest 2 View  06/28/2015  CLINICAL DATA:  Weakness.  Hyperglycemia. EXAM: CHEST  2 VIEW COMPARISON:  Chest radiograph 10/17/2014. FINDINGS: Monitoring leads overlie the patient. Stable enlarged cardiac and mediastinal contours. Elevation right hemidiaphragm. No pulmonary consolidation. No pleural effusion or pneumothorax. Lumbar spine wedge compression deformity. IMPRESSION: No acute cardiopulmonary process.  Cardiomegaly. Electronically Signed   By: Lovey Newcomer M.D.   On: 06/28/2015 19:11    EKG: Independently reviewed. Sinus rhythm. Right bundle branch block, left anterior fascicular block. No acute ST elevation or depression.  Assessment/Plan Present on Admission:  . Hypoglycemia . CKD (chronic kidney disease), stage III . Hyperkalemia . Type 2 diabetes mellitus with stage 4 chronic kidney disease ([place]) . Essential hypertension  This patient was discussed with the ED physician, including pertinent vitals, physical exam findings, labs, and imaging.  We also discussed care given by the ED provider.  #1 hypoglycemia  Observation  Uncertain of etiology. Lexi comp information for losartan shows a side effect of hypoglycemia in type II  diabetics with nephropathy in greater than 4% of the population.   Will hold losartan  Check CBGs every 2 hours for 2 times then before meals and daily at bedtime if CBGs are controlled  Hold insulin tonight #2 essential hypertension  Patient little hypotensive while in the emergency department  Hold parameters placed on antihypertensives  We'll hold losartan  Restart blood pressure medications in the morning #3 hyperkalemia  Hold potassium  Recheck potassium in the morning #4 type II diabetes  Restart insulin the morning - hold parameters placed on the insulin to hold if blood sugars are less than 90 #5 chronic kidney disease  We will renally dose medications if needed  DVT prophylaxis: Lovenox  Consultants: None  Code Status: Full code  Family Communication: None   Disposition Plan: Observation on telemetry - anticipate discharge tomorrow evening   Truett Mainland, DO [facility] Pager [phone]

## 2015-06-28 NOTE — ED Notes (Signed)
Pt given frozen meal per request,

## 2015-06-28 NOTE — ED Notes (Signed)
Pt comes in via EMS for hypotension. Pt has had unstable glucose over the last 3-4 days. Pt has been able to get CBG up with food but has had to call EMS several times for hypotension. Pt is alert and oriented upon triage.   CBG 42 upon EMS arrival to her home. Pt was eating then. CBG on the way to hospital 79.

## 2015-06-28 NOTE — ED Provider Notes (Signed)
CSN: [id number]     Arrival date & time 06/28/15  1701 History   First MD Initiated Contact with Patient 06/28/15 1705     Chief Complaint  Patient presents with  . Hypoglycemia      HPI Pt was seen at 1710. Per EMS, pt's family and pt report: c/o gradual onset and worsening of multiple recurrent episodes of "low blood sugar" and "low blood pressure" for the past 3 days. EMS states they have been to pt's house several times over the past 3 days for these complaints. Pt states her symptoms began after her PMD "changed my BP meds" on Monday and Wednesday of this past week (started Norvasc, increased losartan). Pt's only c/o generalized weakness "when my sugar gets low." Denies CP/palpitations, no SOB/cough, no abd pain, no N/V/D, no fevers, no falls, no syncope.   Past Medical History  Diagnosis Date  . Hypertension   . Hyperlipidemia   . GERD (gastroesophageal reflux disease)   . GAD (generalized anxiety disorder)   . Hypothyroidism   . Type II diabetes mellitus ([place])   . Right bundle branch block   . LAFB (left anterior fascicular block)   . History of rectal abscess     12-29-2004  bedside I & D  . History of GI bleed     upper 2009  due to esophagitis  &  2002  due to Mallory-Weiss tear  . History of colon polyps     benign  . Diverticulosis of colon   . History of esophagitis   . Diabetic gastroparesis ([place])   . History of gout     in issues for several years  . Sacral decubitus ulcer     since 2014- 01-02-15 remains with wound" gauze dressing changes daily.  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 Coronary artery disease   . CKD (chronic kidney disease), stage III     nephrologist--  dr Mercy Moore  . Diabetic retinopathy ([place])     bilateral --  monitored by dr [name]  . Arthritis     knees and hand/fingers. "broke back"-being evaluated for this"weakness left leg"  . Anemia in chronic renal disease     Aranesp injection --  when Hg <11, last  injection 12-24-14.  Marland Kitchen Anxiety   . Constipation   . Complication of anesthesia     post-op confusion    Past Surgical History  Procedure Laterality Date  . Retinopathy surgery Bilateral 1980's?  . Orif femur fracture Left 10/09/2012    Procedure: OPEN REDUCTION INTERNAL FIXATION (ORIF) DISTAL FEMUR FRACTURE;  Surgeon: Rozanna Box, MD;  Location: [facility];  Service: Orthopedics;  Laterality: Left;  . Compression hip screw Right 05/18/2014    Procedure: COMPRESSION HIP;  Surgeon: Carole Civil, MD;  Location: [facility];  Service: Orthopedics;  Laterality: Right;  . Esophagogastroduodenoscopy  last one 01-09-2011  . Transthoracic echocardiogram  02-18-2011    mild LVH,  ef 55-60%,  grade I diastolic dysfunction/  mild TR/  RV systolic pressure increased consistant with moderate pulmonary hypertension  . Cardiovascular stress test  12-30-2004    normal perfusion study/  no ischemia or infartion/  normal LV wall function and wall motion , ef 66%  . Colonoscopy w/ polypectomy  last one 2008  . Cataract extraction w/ intraocular lens  implant, bilateral  1995  . Incision and drainage of wound N/A 09/30/2014    Procedure: IRRIGATION AND DEBRIDEMENT SACRAL  WOUND, EXCISION OF PERIRECTAL TRACT WITH PLACEMENT OF ACCELL;  Surgeon: Theodoro Kos, DO;  Location: [facility];  Service: Plastics;  Laterality: N/A;  . Evaluation under anesthesia with fistulectomy N/A 01/06/2015    Procedure: EXAM UNDER ANESTHESIA , placement of seton;  Surgeon: Jackolyn Confer, MD;  Location: [facility];  Service: General;  Laterality: N/A;  . I&d extremity N/A 04/07/2015    Procedure: IRRIGATION AND DEBRIDEMENT ISCHIAL ULCER;  Surgeon: Loel Lofty Dillingham, DO;  Location: [facility];  Service: Plastics;  Laterality: N/A;  . Application of a-cell of extremity N/A 04/07/2015    Procedure: A CELL PLACMENT;  Surgeon: Loel Lofty Dillingham, DO;  Location: [facility];  Service: Plastics;  Laterality: N/A;   Family History  Problem  Relation Age of Onset  . Colon cancer Father   . Prostate cancer Father   . Diabetes Father   . Coronary artery disease Mother 73  . Heart disease Mother   . Diabetes Mother   . Coronary artery disease Sister 71  . Diabetes Sister   . Heart disease Sister   . Diabetes Maternal Grandmother   . Diabetes Sister   . Diabetes Sister   . Diabetes Sister   . Diabetes Sister   . Diabetes Sister    Social History  Substance Use Topics  . Smoking status: Never Smoker   . Smokeless tobacco: Never Used  . Alcohol Use: No    Review of Systems ROS: Statement: All systems negative except as marked or noted in the HPI; Constitutional: Negative for fever and chills. ; ; Eyes: Negative for eye pain, redness and discharge. ; ; ENMT: Negative for ear pain, hoarseness, nasal congestion, sinus pressure and sore throat. ; ; Cardiovascular: Negative for chest pain, palpitations, diaphoresis, dyspnea and peripheral edema. ; ; Respiratory: Negative for cough, wheezing and stridor. ; ; Gastrointestinal: Negative for nausea, vomiting, diarrhea, abdominal pain, blood in stool, hematemesis, jaundice and rectal bleeding. . ; ; Genitourinary: Negative for dysuria, flank pain and hematuria. ; ; Musculoskeletal: Negative for back pain and neck pain. Negative for swelling and trauma.; ; Skin: Negative for pruritus, rash, abrasions, blisters, bruising and skin lesion.; ; Neuro: +lightheadedness, generalized weakness. Negative for headache and neck stiffness. Negative for altered level of consciousness , altered mental status, extremity weakness, paresthesias, involuntary movement, seizure and syncope.      Allergies  Aspirin; Ciprofloxacin; Codeine; Micardis; Nexium; Niaspan; Onglyza; Other; Rofecoxib; Simvastatin; Tequin; Welchol; and Amoxicillin  Home Medications   Prior to Admission medications   Medication Sig Start Date End Date Taking? Authorizing Provider  amLODipine (NORVASC) 5 MG tablet Take 1 tablet (5 mg  total) by mouth daily. 06/25/15  Yes Eustaquio Maize, MD  losartan (COZAAR) 100 MG tablet Take 1 tablet (100 mg total) by mouth every morning. 06/23/15  Yes Eustaquio Maize, MD  atorvastatin (LIPITOR) 40 MG tablet Take 1 tablet (40 mg total) by mouth every evening. 02/03/15   Mary-Margaret Hassell Done, FNP  Blood Glucose Monitoring Suppl (ONETOUCH VERIO) w/Device KIT 1 each by Does not apply route 2 (two) times daily. Dx E11.65 05/21/15   Cassandria Anger, MD  calcitRIOL (ROCALTROL) 0.25 MCG capsule TAKE 1 CAPSULE ONE TIME DAILY 03/07/15   Mary-Margaret Hassell Done, FNP  cloNIDine (CATAPRES) 0.1 MG tablet BID 05/24/15   Historical Provider, MD  furosemide (LASIX) 40 MG tablet Take 1 tablet (40 mg total) by mouth every morning. 02/03/15   Mary-Margaret Hassell Done, FNP  glucose blood (ONETOUCH VERIO) test strip  Test 2 x daily and prn  E11.9 05/21/15   Cassandria Anger, MD  insulin aspart protamine - aspart (NOVOLOG MIX 70/30 FLEXPEN) (70-30) 100 UNIT/ML FlexPen Inject 0.1 mLs (10 Units total) into the skin 2 (two) times daily with a meal. Patient taking differently: Inject 10-12 Units into the skin 2 (two) times daily with a meal.  05/30/15   Cassandria Anger, MD  Insulin Pen Needle (B-D ULTRAFINE III SHORT PEN) 31G X 8 MM MISC 1 each by Does not apply route as directed. 05/14/15   Cassandria Anger, MD  levothyroxine (SYNTHROID, LEVOTHROID) 75 MCG tablet Take 1 tablet (75 mcg total) by mouth daily before breakfast. 02/03/15   Mary-Margaret Hassell Done, FNP  lidocaine (XYLOCAINE) 5 % ointment Apply small amount (3 to 5 grams)  to affected area(s) two times a day 05/12/15   Mary-Margaret Hassell Done, FNP  metoprolol succinate (TOPROL-XL) 50 MG 24 hr tablet Take 1 tablet (50 mg total) by mouth every morning. 02/03/15   Mary-Margaret Hassell Done, FNP  ondansetron (ZOFRAN) 4 MG tablet Take 1 tablet (4 mg total) by mouth every 8 (eight) hours as needed for nausea or vomiting. 02/03/15   Mary-Margaret Hassell Done, FNP  [facility] DELICA  LANCETS 82X MISC Test one time per day and prn 02/28/15   Mary-Margaret Hassell Done, FNP  pantoprazole (PROTONIX) 40 MG tablet Take 1 tablet (40 mg total) by mouth 2 (two) times daily. 02/03/15   Mary-Margaret Hassell Done, FNP  potassium chloride SA (K-DUR,KLOR-CON) 20 MEQ tablet Take 1 tablet (20 mEq total) by mouth daily. 06/23/15   Eustaquio Maize, MD  pregabalin (LYRICA) 50 MG capsule Take 1 capsule (50 mg total) by mouth 2 (two) times daily. 06/03/15   Melvenia Beam, MD  UNIFINE PENTIPS 32G X 4 MM MISC  05/14/15   Historical Provider, MD   BP 111/50 mmHg  Pulse 65  Temp(Src) 98 F (36.7 C) (Oral)  Resp 16  Ht '5\' 2"'  (1.575 m)  Wt 153 lb (69.4 kg)  BMI 27.98 kg/m2 Physical Exam  1715: Physical examination:  Nursing notes reviewed; Vital signs and O2 SAT reviewed;  Constitutional: Well developed, Well nourished, In no acute distress; Head:  Normocephalic, atraumatic; Eyes: EOMI, PERRL, No scleral icterus; ENMT: Mouth and pharynx normal, Mucous membranes dry; Neck: Supple, Full range of motion, No lymphadenopathy; Cardiovascular: Regular rate and rhythm, No gallop; Respiratory: Breath sounds clear & equal bilaterally, No wheezes.  Speaking full sentences with ease, Normal respiratory effort/excursion; Chest: Nontender, Movement normal; Abdomen: Soft, Nontender, Nondistended, Normal bowel sounds; Genitourinary: No CVA tenderness; Extremities: Pulses normal, No tenderness, No edema, No calf edema or asymmetry.; Neuro: AA&Ox3, Major CN grossly intact.  Speech clear. No gross focal motor or sensory deficits in extremities.; Skin: Color normal, Warm, Dry.   ED Course  Procedures (including critical care time) Labs Review  Imaging Review  I have personally reviewed and evaluated these images and lab results as part of my medical decision-making.   EKG Interpretation   Date/Time:  Saturday June 28 2015 18:33:01 EST Ventricular Rate:  65 PR Interval:  150 QRS Duration: 137 QT Interval:  447 QTC  Calculation: 465 R Axis:   -69 Text Interpretation:  Sinus rhythm Left axis deviation RBBB and LAFB  Nonspecific ST and T wave abnormality Artifact When compared with ECG of  05/17/2014 Nonspecific ST and T wave abnormality is now Present Confirmed  by [facility]  MD, Nunzio Cory [phone]) on 06/28/2015 6:52:15 PM      MDM  MDM  Reviewed: previous chart, nursing note and vitals Reviewed previous: labs and ECG Interpretation: labs, ECG and x-ray     Results for orders placed or performed during the hospital encounter of 06/28/15  CBC with Differential  Result Value Ref Range   WBC 11.1 (H) 4.0 - 10.5 K/uL   RBC 4.08 3.87 - 5.11 MIL/uL   Hemoglobin 12.1 12.0 - 15.0 g/dL   HCT 37.7 36.0 - 46.0 %   MCV 92.4 78.0 - 100.0 fL   MCH 29.7 26.0 - 34.0 pg   MCHC 32.1 30.0 - 36.0 g/dL   RDW 14.0 11.5 - 15.5 %   Platelets 207 150 - 400 K/uL   Neutrophils Relative % 65 %   Neutro Abs 7.2 1.7 - 7.7 K/uL   Lymphocytes Relative 25 %   Lymphs Abs 2.8 0.7 - 4.0 K/uL   Monocytes Relative 8 %   Monocytes Absolute 0.9 0.1 - 1.0 K/uL   Eosinophils Relative 2 %   Eosinophils Absolute 0.2 0.0 - 0.7 K/uL   Basophils Relative 0 %   Basophils Absolute 0.0 0.0 - 0.1 K/uL  Lactic acid, plasma  Result Value Ref Range   Lactic Acid, Venous 1.7 0.5 - 2.0 mmol/L  Troponin I  Result Value Ref Range   Troponin I 0.03 <0.031 ng/mL  Basic metabolic panel  Result Value Ref Range   Sodium 141 135 - 145 mmol/L   Potassium 5.4 (H) 3.5 - 5.1 mmol/L   Chloride 108 101 - 111 mmol/L   CO2 23 22 - 32 mmol/L   Glucose, Bld 62 (L) 65 - 99 mg/dL   BUN 36 (H) 6 - 20 mg/dL   Creatinine, Ser 1.58 (H) 0.44 - 1.00 mg/dL   Calcium 9.1 8.9 - 10.3 mg/dL   GFR calc non Af Amer 32 (L) >60 mL/min   GFR calc Af Amer 37 (L) >60 mL/min   Anion gap 10 5 - 15  CBG monitoring, ED  Result Value Ref Range   Glucose-Capillary 98 65 - 99 mg/dL   Dg Chest 2 View 06/28/2015  CLINICAL DATA:  Weakness.  Hyperglycemia. EXAM: CHEST  2 VIEW  COMPARISON:  Chest radiograph 10/17/2014. FINDINGS: Monitoring leads overlie the patient. Stable enlarged cardiac and mediastinal contours. Elevation right hemidiaphragm. No pulmonary consolidation. No pleural effusion or pneumothorax. Lumbar spine wedge compression deformity. IMPRESSION: No acute cardiopulmonary process.  Cardiomegaly. Electronically Signed   By: Lovey Newcomer M.D.   On: 06/28/2015 19:11   Results for JENEVIE, CASSTEVENS (MRN [id number]) as of 06/28/2015 19:32  Ref. Range 05/15/2015 20:57 06/21/2015 13:52 06/28/2015 17:26  BUN Latest Ref Range: 6-20 mg/dL 43 (H) 24 (H) 36 (H)  Creatinine Latest Ref Range: 0.44-1.00 mg/dL 1.69 (H) 1.34 (H) 1.58 (H)    1905:  Pt has tol PO meal well while in the ED. CBG continues labile. Orthostatic on VS; judicious IVF given. Dx and testing d/w pt and family.  Questions answered.  Verb understanding, agreeable to observation admit.  T/C to [facility] Dr. Nehemiah Settle, case discussed, including:  HPI, pertinent PM/SHx, VS/PE, dx testing, ED course and treatment:  Agreeable to admit, requests to write temporary orders, obtain observation tele bed to team [facility].   Francine Graven, DO 06/29/15 2139

## 2015-06-28 NOTE — ED Notes (Signed)
Patient given meal tray.

## 2015-06-29 DIAGNOSIS — I10 Essential (primary) hypertension: Secondary | ICD-10-CM | POA: Diagnosis not present

## 2015-06-29 DIAGNOSIS — E875 Hyperkalemia: Secondary | ICD-10-CM | POA: Diagnosis not present

## 2015-06-29 DIAGNOSIS — N183 Chronic kidney disease, stage 3 (moderate): Secondary | ICD-10-CM | POA: Diagnosis not present

## 2015-06-29 DIAGNOSIS — E11649 Type 2 diabetes mellitus with hypoglycemia without coma: Secondary | ICD-10-CM | POA: Diagnosis not present

## 2015-06-29 LAB — BASIC METABOLIC PANEL
Anion gap: 8 (ref 5–15)
BUN: 35 mg/dL — AB (ref 6–20)
CALCIUM: 8.2 mg/dL — AB (ref 8.9–10.3)
CO2: 22 mmol/L (ref 22–32)
CREATININE: 1.78 mg/dL — AB (ref 0.44–1.00)
Chloride: 109 mmol/L (ref 101–111)
GFR calc Af Amer: 32 mL/min — ABNORMAL LOW (ref >60)
GFR, EST NON AFRICAN AMERICAN: 28 mL/min — AB (ref >60)
GLUCOSE: 96 mg/dL (ref 65–99)
Potassium: 4.9 mmol/L (ref 3.5–5.1)
SODIUM: 139 mmol/L (ref 135–145)

## 2015-06-29 LAB — GLUCOSE, CAPILLARY
GLUCOSE-CAPILLARY: 88 mg/dL (ref 65–99)
GLUCOSE-CAPILLARY: 100 mg/dL — AB (ref 65–99)
GLUCOSE-CAPILLARY: 60 mg/dL — AB (ref 65–99)
GLUCOSE-CAPILLARY: 77 mg/dL (ref 65–99)
GLUCOSE-CAPILLARY: 79 mg/dL (ref 65–99)
GLUCOSE-CAPILLARY: 78 mg/dL (ref 65–99)
Glucose-Capillary: 76 mg/dL (ref 65–99)
Glucose-Capillary: 85 mg/dL (ref 65–99)
Glucose-Capillary: 85 mg/dL (ref 65–99)
Glucose-Capillary: 60 mg/dL — ABNORMAL LOW (ref 65–99)
Glucose-Capillary: 78 mg/dL (ref 65–99)
Glucose-Capillary: 94 mg/dL (ref 65–99)

## 2015-06-29 MED ORDER — INSULIN ASPART PROT & ASPART (70-30 MIX) 100 UNIT/ML ~~LOC~~ SUSP
10.0000 [IU] | Freq: Two times a day (BID) | SUBCUTANEOUS | Status: DC
  Filled 2015-06-29: qty 10

## 2015-06-29 MED ORDER — ENOXAPARIN SODIUM 30 MG/0.3ML ~~LOC~~ SOLN
30.0000 mg | SUBCUTANEOUS | Status: DC
  Administered 2015-06-29 – 2015-06-30 (×2): 30 mg via SUBCUTANEOUS
  Filled 2015-06-29 (×2): qty 0.3

## 2015-06-29 MED ORDER — INSULIN ASPART PROT & ASPART (70-30 MIX) 100 UNIT/ML ~~LOC~~ SUSP: 5.0000 [IU] | Freq: Two times a day (BID) | SUBCUTANEOUS | Status: DC

## 2015-06-29 NOTE — Progress Notes (Signed)
Patient CBG 60, patient ate dinner, recheck CBG 77. Dr. Wyline Copas aware.

## 2015-06-29 NOTE — Progress Notes (Signed)
[facility] PROGRESS NOTE  [name] [id number] DOB: [birth year] DOA: 06/28/2015 PCP: [name] Maize, MD  HPI/Brief narrative Please see admit h and p from 2/4 for details. Briefly, 70 y.o. female with a history of type 2 diabetes, GERD, hypothyroidism, hypertension, chronic kidney disease stage III, coronary artery disease. Patient was seen by her primary care physician, who noted elevated blood pressure in the 123456 systolically. Patient was started on losartan 100 mg daily and amlodipine 5 mg and discontinued her clonidine. Since that time, the patient has had several episodes of low blood sugars to the 40s.. Patient was admitted for further work up.  Assessment/Plan: #1 hypoglycemia  Patient was admitted to the floor  Unclear etiology. Lexi comp information for losartan noted a side effect of hypoglycemia in type II diabetics with nephropathy in greater than 4% of the population.   Losartan on hold  Glucose remains borderline low despite adequate PO intake this AM  Decrease 70/30 dose to 5 units bid, next dose later tonight #2 essential hypertension  Hold parameters placed on antihypertensives  Holding losartan per above  Continue other BP meds #3 hyperkalemia  Hold potassium  Recheck potassium in the morning #4 type II diabetes  Restart insulin the morning - hold parameters placed on the insulin to hold if blood sugars are less than 90  Pt following endocrinology as outpatient #5 chronic kidney disease  We will renally dose medications if needed  Code Status: Full Family Communication: Pt in room Disposition Plan: Possible d/c in 1-2 days   Consultants:    Procedures:    Antibiotics: Anti-infectives    None       HPI/Subjective: Feels better today. Eager to go home  Objective: Filed Vitals:   06/28/15 2119 06/28/15 2238 06/29/15 0516 06/29/15 1224  BP: 113/53 107/58 109/46   Pulse: 64 70 67   Temp: 97.4 F (36.3 C) 97.6 F (36.4  C) 98.6 F (37 C)   TempSrc: Oral Oral Oral   Resp: 18 20 15    Height:      Weight:      SpO2: 99%  98% 98%    Intake/Output Summary (Last 24 hours) at 06/29/15 1336 Last data filed at 06/29/15 0901  Gross per 24 hour  Intake    840 ml  Output      0 ml  Net    840 ml   Filed Weights   06/28/15 1659  Weight: 69.4 kg (153 lb)    Exam:   General:  Awake, in nad  Cardiovascular: regular, s1, s2  Respiratory: normal resp effort, no wheezing  Abdomen: soft,nondistended  Musculoskeletal: perfused, no clubbing   Data Reviewed: Basic Metabolic Panel:  Recent Labs Lab 06/28/15 1726 06/29/15 0620  NA 141 139  [name] 5.4* 4.9  CL 108 109  CO2 23 22  GLUCOSE 62* 96  BUN 36* 35*  CREATININE 1.58* 1.78*  CALCIUM 9.1 8.2*   Liver Function Tests: No results for input(s): AST, ALT, ALKPHOS, BILITOT, PROT, ALBUMIN in the last 168 hours. No results for input(s): LIPASE, AMYLASE in the last 168 hours. No results for input(s): AMMONIA in the last 168 hours. CBC:  Recent Labs Lab 06/28/15 1726  WBC 11.1*  NEUTROABS 7.2  HGB 12.1  HCT 37.7  MCV 92.4  PLT 207   Cardiac Enzymes:  Recent Labs Lab 06/28/15 1726  TROPONINI 0.03   BNP (last 3 results) No results for input(s): BNP in the last 8760 hours.  ProBNP (  last 3 results) No results for input(s): PROBNP in the last 8760 hours.  CBG:  Recent Labs Lab 06/29/15 0513 06/29/15 0707 06/29/15 0911 06/29/15 1125 06/29/15 1332  GLUCAP 88 100* 85 60* 78    No results found for this or any previous visit (from the past 240 hour(s)).   Studies: Dg Chest 2 View  06/28/2015  CLINICAL DATA:  Weakness.  Hyperglycemia. EXAM: CHEST  2 VIEW COMPARISON:  Chest radiograph 10/17/2014. FINDINGS: Monitoring leads overlie the patient. Stable enlarged cardiac and mediastinal contours. Elevation right hemidiaphragm. No pulmonary consolidation. No pleural effusion or pneumothorax. Lumbar spine wedge compression deformity.  IMPRESSION: No acute cardiopulmonary process.  Cardiomegaly. Electronically Signed   By: [name] M.D.   On: 06/28/2015 19:11    Scheduled Meds: . amLODipine  5 mg Oral Daily  . atorvastatin  40 mg Oral QPM  . enoxaparin (LOVENOX) injection  30 mg Subcutaneous Q24H  . furosemide  40 mg Oral q morning - 10a  . insulin aspart  0-9 Units Subcutaneous TID WC  . insulin aspart protamine- aspart  5 Units Subcutaneous BID WC  . levothyroxine  75 mcg Oral QAC breakfast  . metoprolol succinate  50 mg Oral q morning - 10a  . pantoprazole  40 mg Oral BID  . pregabalin  50 mg Oral BID   Continuous Infusions: . sodium chloride 75 mL/hr at 06/28/15 2306    Active Problems:   Essential hypertension   Type 2 diabetes mellitus with stage 4 chronic kidney disease ([place])   CKD (chronic kidney disease), stage III   Hypoglycemia   Hyperkalemia   [name]  [facility] Pager [phone]. If 7PM-7AM, please contact night-coverage at [email or web address], password [name] 06/29/2015, 1:36 PM

## 2015-06-29 NOTE — Progress Notes (Signed)
CBG 60, patient asymptomatic. Snack and lunch given, recheck CBG 78. Dr. Wyline Copas notified.

## 2015-06-30 ENCOUNTER — Telehealth: Payer: Self-pay | Admitting: "Endocrinology

## 2015-06-30 DIAGNOSIS — E875 Hyperkalemia: Secondary | ICD-10-CM | POA: Diagnosis not present

## 2015-06-30 DIAGNOSIS — E162 Hypoglycemia, unspecified: Secondary | ICD-10-CM | POA: Diagnosis not present

## 2015-06-30 DIAGNOSIS — E11649 Type 2 diabetes mellitus with hypoglycemia without coma: Secondary | ICD-10-CM | POA: Diagnosis not present

## 2015-06-30 DIAGNOSIS — N179 Acute kidney failure, unspecified: Secondary | ICD-10-CM | POA: Diagnosis not present

## 2015-06-30 DIAGNOSIS — I10 Essential (primary) hypertension: Secondary | ICD-10-CM | POA: Diagnosis not present

## 2015-06-30 LAB — BASIC METABOLIC PANEL
Anion gap: 5 (ref 5–15)
BUN: 44 mg/dL — AB (ref 6–20)
CHLORIDE: 110 mmol/L (ref 101–111)
CO2: 23 mmol/L (ref 22–32)
CREATININE: 2.24 mg/dL — AB (ref 0.44–1.00)
Calcium: 7.8 mg/dL — ABNORMAL LOW (ref 8.9–10.3)
GFR, EST AFRICAN AMERICAN: 24 mL/min — AB (ref >60)
GFR, EST NON AFRICAN AMERICAN: 21 mL/min — AB (ref >60)
Glucose, Bld: 123 mg/dL — ABNORMAL HIGH (ref 65–99)
POTASSIUM: 5.2 mmol/L — AB (ref 3.5–5.1)
SODIUM: 138 mmol/L (ref 135–145)

## 2015-06-30 LAB — GLUCOSE, CAPILLARY
GLUCOSE-CAPILLARY: 142 mg/dL — AB (ref 65–99)
GLUCOSE-CAPILLARY: 46 mg/dL — AB (ref 65–99)
Glucose-Capillary: 65 mg/dL (ref 65–99)
Glucose-Capillary: 83 mg/dL (ref 65–99)
Glucose-Capillary: 78 mg/dL (ref 65–99)
Glucose-Capillary: 149 mg/dL — ABNORMAL HIGH (ref 65–99)
Glucose-Capillary: 172 mg/dL — ABNORMAL HIGH (ref 65–99)
Glucose-Capillary: 186 mg/dL — ABNORMAL HIGH (ref 65–99)

## 2015-06-30 MED ORDER — NYSTATIN 100000 UNIT/GM EX POWD
Freq: Two times a day (BID) | CUTANEOUS | Status: DC
  Administered 2015-06-30 – 2015-07-01 (×2): via TOPICAL
  Filled 2015-06-30: qty 15

## 2015-06-30 MED ORDER — DEXTROSE 50 % IV SOLN
INTRAVENOUS | Status: AC
  Administered 2015-06-30: 02:00:00
  Filled 2015-06-30: qty 50

## 2015-06-30 MED ORDER — NYSTATIN 100000 UNIT/GM EX POWD
CUTANEOUS | Status: AC
  Filled 2015-06-30: qty 15

## 2015-06-30 NOTE — Telephone Encounter (Signed)
Dr. Hermine Messick wants to talk to you about [name]

## 2015-06-30 NOTE — Progress Notes (Signed)
[facility] PROGRESS NOTE  [name] [id number] DOB: 1945-09-14 DOA: 06/28/2015 PCP: [name] Maize, MD  HPI/Brief narrative Please see admit h and p from 2/4 for details. Briefly, 70 y.o. female with a history of type 2 diabetes, GERD, hypothyroidism, hypertension, chronic kidney disease stage III, coronary artery disease. Patient was seen by her primary care physician, who noted elevated blood pressure in the 123456 systolically. Patient was started on losartan 100 mg daily and amlodipine 5 mg and discontinued her clonidine. Since that time, the patient has had several episodes of low blood sugars to the 40s.. Patient was admitted for further work up.  Assessment/Plan: #1 hypoglycemia  Patient was admitted to the floor  Unclear etiology. Lexi comp information for losartan noted a side effect of hypoglycemia in type II diabetics with nephropathy in greater than 4% of the population.   Losartan remains on hold  Glucose remains borderline low despite adequate PO intake  Patient noted to be hypoglycemic overnight  Discussed case with Dr. Dorris Fetch who recommends stopping scheduled insulin and OK to d/c if glucose remains stable at least over 70's to follow up with Endocrine next week #2 essential hypertension  Hold parameters placed on antihypertensives  Holding losartan per above  Continue other BP meds #3 hyperkalemia  Hold potassium  Recheck potassium in the morning #4 type II diabetes  Restart insulin the morning - hold parameters placed on the insulin to hold if blood sugars are less than 90  Pt following endocrinology as outpatient #5 acute on chronic kidney disease  Cr trending up  D/c lasix  Increase IVF rate  Code Status: Full Family Communication: Pt in room Disposition Plan: Possible d/c in 1-2 days   Consultants:    Procedures:    Antibiotics: Anti-infectives    None      HPI/Subjective: Wants to go home  Objective: Filed  Vitals:   06/29/15 2103 06/30/15 0545 06/30/15 1435 06/30/15 1437  BP: 103/44 106/46 113/46   Pulse: 73 63 77   Temp: 97.7 F (36.5 C) 98.5 F (36.9 C) 98.3 F (36.8 C)   TempSrc: Oral Oral Oral   Resp: 20 16 18    Height:      Weight:      SpO2: 100% 97% 99% 99%    Intake/Output Summary (Last 24 hours) at 06/30/15 1502 Last data filed at 06/30/15 1442  Gross per 24 hour  Intake    840 ml  Output   1450 ml  Net   -610 ml   Filed Weights   06/28/15 1659  Weight: 69.4 kg (153 lb)    Exam:   General:  Awake, in nad, laying in bed  Cardiovascular: regular, s1, s2  Respiratory: normal resp effort, no wheezing  Abdomen: soft,nondistended  Musculoskeletal: perfused, no clubbing, no cyanosis  Data Reviewed: Basic Metabolic Panel:  Recent Labs Lab 06/28/15 1726 06/29/15 0620 06/30/15 0534  NA 141 139 138  K 5.4* 4.9 5.2*  CL 108 109 110  CO2 23 22 23   GLUCOSE 62* 96 123*  BUN 36* 35* 44*  CREATININE 1.58* 1.78* 2.24*  CALCIUM 9.1 8.2* 7.8*   Liver Function Tests: No results for input(s): AST, ALT, ALKPHOS, BILITOT, PROT, ALBUMIN in the last 168 hours. No results for input(s): LIPASE, AMYLASE in the last 168 hours. No results for input(s): AMMONIA in the last 168 hours. CBC:  Recent Labs Lab 06/28/15 1726  WBC 11.1*  NEUTROABS 7.2  HGB 12.1  HCT 37.7  MCV  92.4  PLT 207   Cardiac Enzymes:  Recent Labs Lab 06/28/15 1726  TROPONINI 0.03   BNP (last 3 results) No results for input(s): BNP in the last 8760 hours.  ProBNP (last 3 results) No results for input(s): PROBNP in the last 8760 hours.  CBG:  Recent Labs Lab 06/30/15 0057 06/30/15 0202 06/30/15 0503 06/30/15 0717 06/30/15 1156  GLUCAP 46* 83 142* 78 149*    Recent Results (from the past 240 hour(s))  Urine culture     Status: None (Preliminary result)   Collection Time: 06/28/15  8:06 PM  Result Value Ref Range Status   Specimen Description URINE, RANDOM  Final   Special  Requests NONE  Final   Culture   Final    CULTURE REINCUBATED FOR BETTER GROWTH Performed at [facility]    Report Status PENDING  Incomplete     Studies: Dg Chest 2 View  06/28/2015  CLINICAL DATA:  Weakness.  Hyperglycemia. EXAM: CHEST  2 VIEW COMPARISON:  Chest radiograph 10/17/2014. FINDINGS: Monitoring leads overlie the patient. Stable enlarged cardiac and mediastinal contours. Elevation right hemidiaphragm. No pulmonary consolidation. No pleural effusion or pneumothorax. Lumbar spine wedge compression deformity. IMPRESSION: No acute cardiopulmonary process.  Cardiomegaly. Electronically Signed   By: Lovey Newcomer M.D.   On: 06/28/2015 19:11    Scheduled Meds: . amLODipine  5 mg Oral Daily  . atorvastatin  40 mg Oral QPM  . enoxaparin (LOVENOX) injection  30 mg Subcutaneous Q24H  . insulin aspart  0-9 Units Subcutaneous TID WC  . levothyroxine  75 mcg Oral QAC breakfast  . metoprolol succinate  50 mg Oral q morning - 10a  . pantoprazole  40 mg Oral BID  . pregabalin  50 mg Oral BID   Continuous Infusions: . sodium chloride 75 mL/hr at 06/30/15 1119    Active Problems:   Essential hypertension   Type 2 diabetes mellitus with stage 4 chronic kidney disease ([place])   CKD (chronic kidney disease), stage III   Hypoglycemia   Hyperkalemia   [name] K  [facility] Pager [phone]. If 7PM-7AM, please contact night-coverage at [email or web address], password [facility] 06/30/2015, 3:02 PM

## 2015-06-30 NOTE — Progress Notes (Signed)
Patient had every 2 hours CBGs ordered for 2 occurrences yesterday and has ac/hs ordered.  Dr. Wyline Copas on unit and RN asked about the orders.  Dr. Wyline Copas gave order to start every 4 hours CBGs.  Dr. Wyline Copas notified of patient's blood pressure and heart rate and gave order to hold morning blood pressure medications (Norvasc and Metoprolol).  Dr. Wyline Copas also notified of patient's loss of IV access and working on obtaining access.  RN coming from PACU.

## 2015-06-30 NOTE — Care Management Note (Signed)
Case Management Note  Patient Details  Name: [name] MRN: [id number] Date of Birth: 1946-03-18  Subjective/Objective:           Spoke with patient who answers questions appropriately . Stated that she lives at home with husband who drives. Uses a walker at home. Denies difficulty with medications. Has had [facility] services before and would like to continue if needs.       Action/Plan:   Expected Discharge Date:                  Expected Discharge Plan:  Home/Self Care  In-House Referral:     Discharge planning Services  CM Consult  Post Acute Care Choice:    Choice offered to:     DME Arranged:    DME Agency:     HH Arranged:    [place] Agency:     Status of Service:  Completed, signed off  Medicare Important Message Given:    Date Medicare IM Given:    Medicare IM give by:    Date Additional Medicare IM Given:    Additional Medicare Important Message give by:     If discussed at [place] of Stay Meetings, dates discussed:    Additional Comments:  Alvie Heidelberg, RN 06/30/2015, 1:48 PM

## 2015-06-30 NOTE — Plan of Care (Signed)
Problem: Pain Managment: Goal: General experience of comfort will improve Outcome: Progressing Pt denies pain.  Problem: Physical Regulation: Goal: Ability to maintain clinical measurements within normal limits will improve Outcome: Progressing Blood sugars have continued to drop despite carb snacks. MD aware.

## 2015-06-30 NOTE — Progress Notes (Signed)
Pt given carb snack for blood sugar of 46. MD notified, awaiting response.

## 2015-06-30 NOTE — Progress Notes (Signed)
Standing order: Hypoglycemic protocol initiated.

## 2015-06-30 NOTE — Progress Notes (Signed)
Inpatient Diabetes Program Recommendations  AACE/ADA: New Consensus Statement on Inpatient Glycemic Control (2015)  Target Ranges:  Prepandial:   less than 140 mg/dL      Peak postprandial:   less than 180 mg/dL (1-2 hours)      Critically ill patients:  140 - 180 mg/dL  Results for [name], [name] (MRN [id number]) as of 06/30/2015 07:31  Ref. Range 06/29/2015 07:07 06/29/2015 09:11 06/29/2015 11:25 06/29/2015 13:32 06/29/2015 16:25 06/29/2015 17:09 06/29/2015 18:57 06/29/2015 21:00 06/29/2015 23:13 06/30/2015 00:57 06/30/2015 02:02 06/30/2015 05:03 06/30/2015 07:17  Glucose-Capillary Latest Ref Range: 65-99 mg/dL 100 (H) 85 60 (L) 78 60 (L) 77 94 79 78 46 (L) 83 142 (H) 78   Review of Glycemic Control  Diabetes history: DM2 Outpatient Diabetes medications: 70/30 10 units BID Current orders for Inpatient glycemic control: Novolog 0-9 units TID with meals  Inpatient Diabetes Program Recommendations: Inpatient Referral: Patient continues to have hypoglycemia despite PO intake and no hypoglycemic medications have been given. Patient is followed by Dr. Dorris Fetch (Endocrinologist) and last saw him in the office on 06/27/15. May want to consider consult Dr. Dorris Fetch for assistance with glycemic control.  Thanks, Barnie Alderman, RN, MSN, CDE Diabetes Coordinator Inpatient Diabetes Program [phone] (Team Pager from [place] to [place]) [phone] ([facility] office) [phone] [facility] office) [phone] [facility] office)

## 2015-07-01 ENCOUNTER — Telehealth: Payer: Self-pay | Admitting: Pediatrics

## 2015-07-01 DIAGNOSIS — E875 Hyperkalemia: Secondary | ICD-10-CM | POA: Diagnosis not present

## 2015-07-01 DIAGNOSIS — I10 Essential (primary) hypertension: Secondary | ICD-10-CM | POA: Diagnosis not present

## 2015-07-01 DIAGNOSIS — E162 Hypoglycemia, unspecified: Secondary | ICD-10-CM | POA: Diagnosis not present

## 2015-07-01 DIAGNOSIS — E11649 Type 2 diabetes mellitus with hypoglycemia without coma: Secondary | ICD-10-CM | POA: Diagnosis not present

## 2015-07-01 DIAGNOSIS — N183 Chronic kidney disease, stage 3 (moderate): Secondary | ICD-10-CM | POA: Diagnosis not present

## 2015-07-01 LAB — BASIC METABOLIC PANEL
Anion gap: 6 (ref 5–15)
BUN: 46 mg/dL — AB (ref 6–20)
CO2: 25 mmol/L (ref 22–32)
Calcium: 7.9 mg/dL — ABNORMAL LOW (ref 8.9–10.3)
Chloride: 109 mmol/L (ref 101–111)
Creatinine, Ser: 1.91 mg/dL — ABNORMAL HIGH (ref 0.44–1.00)
GFR calc Af Amer: 30 mL/min — ABNORMAL LOW (ref >60)
GFR, EST NON AFRICAN AMERICAN: 25 mL/min — AB (ref >60)
Glucose, Bld: 121 mg/dL — ABNORMAL HIGH (ref 65–99)
POTASSIUM: 4.7 mmol/L (ref 3.5–5.1)
SODIUM: 140 mmol/L (ref 135–145)

## 2015-07-01 LAB — GLUCOSE, CAPILLARY
GLUCOSE-CAPILLARY: 114 mg/dL — AB (ref 65–99)
GLUCOSE-CAPILLARY: 260 mg/dL — AB (ref 65–99)
GLUCOSE-CAPILLARY: 263 mg/dL — AB (ref 65–99)
Glucose-Capillary: 106 mg/dL — ABNORMAL HIGH (ref 65–99)

## 2015-07-01 NOTE — Progress Notes (Signed)
Discharged PT per MD order and protocol. Reviewed discharge teaching and handouts given. Pt verbalized understanding and left with all belongings. Medication were explained. Medications that were sent to pharmacy were given to patient. Medication sheet signed.  VSS. IV catheter D/C.  Patient wheeled down by staff member.

## 2015-07-01 NOTE — Care Management Note (Signed)
Case Management Note  Patient Details  Name: MIRCLE FEDRICK MRN: [id number] Date of Birth: November 04, 1945  Subjective/Objective:                    Action/Plan:  Home with Home health.  Expected Discharge Date:                  Expected Discharge Plan:  Home/Self Care  In-House Referral:     Discharge planning Services  CM Consult  Post Acute Care Choice:    Choice offered to:  Patient  DME Arranged:   [place]ie Moselle) DME Agency:  [place]:  RN, PT, Social Work [place] Agency:  [place]  Status of Service:  Completed, signed off  Medicare Important Message Given:    Date Medicare IM Given:    Medicare IM give by:    Date Additional Medicare IM Given:    Additional Medicare Important Message give by:     If discussed at [place] of Stay Meetings, dates discussed:    Additional Comments:  Alvie Heidelberg, RN 07/01/2015, 12:22 PM

## 2015-07-01 NOTE — Evaluation (Signed)
Physical Therapy Evaluation Patient Details Name: [name] MRN: [id number] DOB: 1946/02/13 Today's Date: 07/01/2015   History of Present Illness  70yo white female who comes to [facility] after multiple episodes of hypoglycemia in a short amount of time. At baseline, pt is able to tolerate household distance ambulation with RW and was receiving HHPT PTA due to chronic LLE impairment.   Clinical Impression  Pt demonstrating baseline function in all functional mobility with chronic impairment, including limited balance, limited LLE activity tolerance, fatigue, and buckling, limited gait speed, and limited ability to ambulate distances greater that what is appropriate for household navigation. Pt is near baseline at time of evaluation and all skilled PT needs can be met at the next venue of care. Pt will benefit from skilled PT intervention and I am recommending she resume HHPT services after DC to address the above deficits. No additional services needed here; PT signing off.      Follow Up Recommendations Home health PT (Continue [facility] HHPT as PTA; pt reports making good progress. )    Equipment Recommendations  Rolling walker with 5" wheels    Recommendations for Other Services       Precautions / Restrictions Precautions Precautions: Fall Restrictions Weight Bearing Restrictions: No      Mobility  Bed Mobility Overal bed mobility:  (Received up in chair)                Transfers Overall transfer level: Modified independent Equipment used: Rolling walker (2 wheeled)                Ambulation/Gait Ambulation/Gait assistance: Min guard Ambulation Distance (Feet): 100 Feet Assistive device: Rolling walker (2 wheeled)   Gait velocity: 0.36ms   General Gait Details: Mild vaulting on LLE  Stairs            Wheelchair Mobility    Modified Rankin (Stroke Patients Only)       Balance Overall balance assessment: Modified Independent                                            Pertinent Vitals/Pain Pain Assessment: No/denies pain    Home Living Family/patient expects to be discharged to:: Private residence Living Arrangements: Spouse/significant other Available Help at Discharge: Available 24 hours/day;Family Type of Home: Mobile home Home Access: Stairs to enter Entrance Stairs-Rails: Can reach both Entrance Stairs-Number of Steps: 4 Home Layout: One level Home Equipment: Bedside commode;Wheelchair - [employer]- 2 wheels (RW is borrowed. )      Prior Function Level of Independence: Needs assistance   Gait / Transfers Assistance Needed: Modified independent with RW   ADL's / Homemaking Assistance Needed: Assistance needed for higher level IADL         Hand Dominance   Dominant Hand: Right    Extremity/Trunk Assessment   Upper Extremity Assessment: Overall WFL for tasks assessed;Defer to OT evaluation           Lower Extremity Assessment: Overall WFL for tasks assessed;Generalized weakness         Communication   Communication: No difficulties  Cognition Arousal/Alertness: Awake/alert Behavior During Therapy: WFL for tasks assessed/performed Overall Cognitive Status: Within Functional Limits for tasks assessed                      General Comments      Exercises  Assessment/Plan    PT Assessment All further PT needs can be met in the next venue of care  PT Diagnosis Abnormality of gait;Difficulty walking   PT Problem List Decreased strength;Decreased activity tolerance;Decreased balance;Decreased mobility  PT Treatment Interventions     PT Goals (Current goals can be found in the Care Plan section) Acute Rehab PT Goals Patient Stated Goal: continue to HHPT to improve funcitonal mobility.  PT Goal Formulation: With patient Time For Goal Achievement: 07/15/15 Potential to Achieve Goals: Good    Frequency     Barriers to discharge        Co-evaluation                End of Session Equipment Utilized During Treatment: Gait belt Activity Tolerance: Patient tolerated treatment well;Patient limited by fatigue Patient left: in chair;with call bell/phone within reach      Functional Limitation: Mobility: Walking and moving around Mobility: Walking and Moving Around Current Status (B3403): At least 20 percent but less than 40 percent impaired, limited or restricted Mobility: Walking and Moving Around Goal Status [phone]): At least 20 percent but less than 40 percent impaired, limited or restricted Mobility: Walking and Moving Around Discharge Status [phone]): At least 20 percent but less than 40 percent impaired, limited or restricted    Time: 1115-1127 PT Time Calculation (min) (ACUTE ONLY): 12 min   Charges:   PT Evaluation $PT Eval Low Complexity: 1 Procedure PT Treatments $Therapeutic Activity: 8-22 mins   PT G Codes:   PT G-Codes **NOT FOR INPATIENT CLASS** Functional Limitation: Mobility: Walking and moving around Mobility: Walking and Moving Around Current Status (M4037): At least 20 percent but less than 40 percent impaired, limited or restricted Mobility: Walking and Moving Around Goal Status [phone]): At least 20 percent but less than 40 percent impaired, limited or restricted Mobility: Walking and Moving Around Discharge Status [phone]): At least 20 percent but less than 40 percent impaired, limited or restricted    12:49 PM, 07/01/2015 Etta Grandchild, PT, DPT PRN Physical Therapist at [place] License # [id number] [phone] (wireless)  [phone] (mobile)

## 2015-07-01 NOTE — Discharge Summary (Signed)
Physician Discharge Summary  TATEM HOLSONBACK [id number] DOB: Nov 22, 1945 DOA: 06/28/2015  PCP: Eustaquio Maize, MD  Admit date: 06/28/2015 Discharge date: 07/01/2015  Time spent: 20 minutes  Recommendations for Outpatient Follow-up:  1. Follow up with PCP in 2-3 weeks 2. Follow up with Dr. Dorris Fetch in one week  3. Please monitor BP closely. Lasix and ARB were held on discharge secondary to worsened renal function this admission  Discharge Diagnoses:  Active Problems:   Essential hypertension   Type 2 diabetes mellitus with stage 4 chronic kidney disease (HCC)   CKD (chronic kidney disease), stage III   Hypoglycemia   Hyperkalemia   Discharge Condition: Improved  Diet recommendation: Diabetic  Filed Weights   06/28/15 1659  Weight: 69.4 kg (153 lb)    History of present illness:  Please review dictated H and P from 2/4 for details. Briefly, 70 y.o. female with a history of type 2 diabetes, GERD, hypothyroidism, hypertension, chronic kidney disease stage III, coronary artery disease. Patient was seen by her primary care physician, who noted elevated blood pressure in the 035K systolically. Patient was started on losartan 100 mg daily and amlodipine 5 mg and discontinued her clonidine. Since that time, the patient has had several episodes of low blood sugars to the 40s.. Patient was admitted for further work up.  Hospital Course:  #1 hypoglycemia  Patient was admitted to the floor  Unclear etiology. Lexi comp information for losartan noted a side effect of hypoglycemia in type II diabetics with nephropathy in greater than 4% of the population.   Losartan was placed on hold  Glucose remained borderline low despite adequate PO intake  Patient noted to be hypoglycemic persistently and the case later discussed case with Dr. Dorris Fetch who recommended stopping scheduled insulin and OK to d/c if glucose remains stable at least over 70's to follow up with Endocrine next week  Overnight  glucose of 186, 260, 114, 106, 263 at time of discharge #2 essential hypertension  Hold parameters placed on antihypertensives  Holding losartan per above  Also held lasix secondary to rising Cr #3 hyperkalemia  Hold potassium  Recheck potassium in the morning #4 type II diabetes  Insulin stopped per Endocrine  Pt following endocrinology as outpatient, follow up in one week #5 acute on chronic kidney disease  Cr briefly trended up  D/c'd lasix  Improved with IVF  Consultations:  Discussed case with Dr. Dorris Fetch on phone  Discharge Exam: Filed Vitals:   06/30/15 1437 06/30/15 2235 07/01/15 0430 07/01/15 0754  BP:  123/40 133/50   Pulse:  75 80   Temp:  97.5 F (36.4 C) 98.7 F (37.1 C)   TempSrc:  Oral Oral   Resp:  20 20   Height:      Weight:      SpO2: 99% 97% 99% 97%    General: awake, in nad Cardiovascular: regular, s1, s2 Respiratory: normal resp effort, no wheezing  Discharge Instructions     Medication List    STOP taking these medications        furosemide 40 MG tablet  Commonly known as:  LASIX     insulin aspart protamine - aspart (70-30) 100 UNIT/ML FlexPen  Commonly known as:  NOVOLOG MIX 70/30 FLEXPEN     losartan 100 MG tablet  Commonly known as:  COZAAR     potassium chloride SA 20 MEQ tablet  Commonly known as:  K-DUR,KLOR-CON      TAKE these medications  amLODipine 5 MG tablet  Commonly known as:  NORVASC  Take 1 tablet (5 mg total) by mouth daily.     atorvastatin 40 MG tablet  Commonly known as:  LIPITOR  Take 1 tablet (40 mg total) by mouth every evening.     glucose blood test strip  Commonly known as:  ONETOUCH VERIO  Test 2 x daily and prn  E11.9     levothyroxine 75 MCG tablet  Commonly known as:  SYNTHROID, LEVOTHROID  Take 1 tablet (75 mcg total) by mouth daily before breakfast.     lidocaine 5 % ointment  Commonly known as:  XYLOCAINE  Apply small amount (3 to 5 grams)  to affected area(s) two times  a day     metoprolol succinate 50 MG 24 hr tablet  Commonly known as:  TOPROL-XL  Take 1 tablet (50 mg total) by mouth every morning.     ondansetron 4 MG tablet  Commonly known as:  ZOFRAN  Take 1 tablet (4 mg total) by mouth every 8 (eight) hours as needed for nausea or vomiting.     ONETOUCH DELICA LANCETS 25W Misc  Test one time per day and prn     ONETOUCH VERIO w/Device Kit  1 each by Does not apply route 2 (two) times daily. Dx E11.65     pantoprazole 40 MG tablet  Commonly known as:  PROTONIX  Take 40 mg by mouth 2 (two) times daily.     UNIFINE PENTIPS 32G X 4 MM Misc  Generic drug:  Insulin Pen Needle       Allergies  Allergen Reactions  . Aspirin Nausea And Vomiting  . Ciprofloxacin Other (See Comments)    Upset Stomach  . Codeine Nausea And Vomiting    Makes me sick   . Micardis [Telmisartan] Other (See Comments)    unknown  . Nexium [Esomeprazole Magnesium] Other (See Comments)    Causes internal bleeding  . Niaspan [Niacin Er] Other (See Comments)    Reaction is unknown  . Onglyza [Saxagliptin] Other (See Comments)    Reaction is unknown  . Other     No otc pain medications  . Rofecoxib Other (See Comments)    unknown  . Simvastatin   . Tequin [Gatifloxacin] Other (See Comments)    Reaction is unknown  . Welchol [Colesevelam Hcl]   . Amoxicillin Rash   Follow-up Information    Follow up with Eustaquio Maize, MD. Schedule an appointment as soon as possible for a visit in 2 weeks.   Specialty:  Pediatrics   Why:  Hospital follow up   Contact information:   [place] [id number] [phone]       Follow up with Glade Lloyd, MD. Schedule an appointment as soon as possible for a visit in 1 week.   Specialty:  Endocrinology   Why:  Hospital follow up   Contact information:   [place] [address] [phone]        The results of significant diagnostics from this hospitalization (including imaging,  microbiology, ancillary and laboratory) are listed below for reference.    Significant Diagnostic Studies: Dg Chest 2 View  06/28/2015  CLINICAL DATA:  Weakness.  Hyperglycemia. EXAM: CHEST  2 VIEW COMPARISON:  Chest radiograph 10/17/2014. FINDINGS: Monitoring leads overlie the patient. Stable enlarged cardiac and mediastinal contours. Elevation right hemidiaphragm. No pulmonary consolidation. No pleural effusion or pneumothorax. Lumbar spine wedge compression deformity. IMPRESSION: No acute  cardiopulmonary process.  Cardiomegaly. Electronically Signed   By: Lovey Newcomer M.D.   On: 06/28/2015 19:11    Microbiology: Recent Results (from the past 240 hour(s))  Urine culture     Status: None (Preliminary result)   Collection Time: 06/28/15  8:06 PM  Result Value Ref Range Status   Specimen Description URINE, RANDOM  Final   Special Requests NONE  Final   Culture   Final    CULTURE REINCUBATED FOR BETTER GROWTH Performed at [facility]    Report Status PENDING  Incomplete     Labs: Basic Metabolic Panel:  Recent Labs Lab 06/28/15 1726 06/29/15 0620 06/30/15 0534 07/01/15 0611  NA 141 139 138 140  K 5.4* 4.9 5.2* 4.7  CL 108 109 110 109  CO2 _0 GLUCOSE 62* 96 123* 121*  BUN 36* 35* 44* 46*  CREATININE 1.58* 1.78* 2.24* 1.91*  CALCIUM 9.1 8.2* 7.8* 7.9*   Liver Function Tests: No results for input(s): AST, ALT, ALKPHOS, BILITOT, PROT, ALBUMIN in the last 168 hours. No results for input(s): LIPASE, AMYLASE in the last 168 hours. No results for input(s): AMMONIA in the last 168 hours. CBC:  Recent Labs Lab 06/28/15 1726  WBC 11.1*  NEUTROABS 7.2  HGB 12.1  HCT 37.7  MCV 92.4  PLT 207   Cardiac Enzymes:  Recent Labs Lab 06/28/15 1726  TROPONINI 0.03   BNP: BNP (last 3 results) No results for input(s): BNP in the last 8760 hours.  ProBNP (last 3 results) No results for input(s): PROBNP in the last 8760 hours.  CBG:  Recent Labs Lab  06/30/15 1958 07/01/15 0027 07/01/15 0426 07/01/15 0739 07/01/15 1133  GLUCAP 186* 260* 114* 106* 263*     Signed:  Tangy Drozdowski K  [facility] 07/01/2015, 1:03 PM

## 2015-07-01 NOTE — Telephone Encounter (Signed)
denied °

## 2015-07-02 ENCOUNTER — Telehealth: Payer: Self-pay | Admitting: *Deleted

## 2015-07-02 DIAGNOSIS — E1122 Type 2 diabetes mellitus with diabetic chronic kidney disease: Secondary | ICD-10-CM | POA: Diagnosis not present

## 2015-07-02 DIAGNOSIS — N183 Chronic kidney disease, stage 3 (moderate): Secondary | ICD-10-CM | POA: Diagnosis not present

## 2015-07-02 DIAGNOSIS — I130 Hypertensive heart and chronic kidney disease with heart failure and stage 1 through stage 4 chronic kidney disease, or unspecified chronic kidney disease: Secondary | ICD-10-CM | POA: Diagnosis not present

## 2015-07-02 DIAGNOSIS — R234 Changes in skin texture: Secondary | ICD-10-CM | POA: Diagnosis not present

## 2015-07-02 DIAGNOSIS — I5030 Unspecified diastolic (congestive) heart failure: Secondary | ICD-10-CM | POA: Diagnosis not present

## 2015-07-02 DIAGNOSIS — K604 Rectal fistula: Secondary | ICD-10-CM | POA: Diagnosis not present

## 2015-07-02 LAB — URINE CULTURE: Culture: >=100000

## 2015-07-02 NOTE — Telephone Encounter (Signed)
I spoke with him already

## 2015-07-02 NOTE — Telephone Encounter (Signed)
Call Completed and Appointment Scheduled: Yes, Date: 07/09/15  with Dr Emiliano Dyer INFORMATION Date of Discharge:07/01/15  Discharge Facility: [facility]  Principal Discharge Diagnosis: Hypoglycemia  Patient and/or caregiver is knowledgeable of his/her condition(s) and treatment: Yes   MEDICATION RECONCILIATION Medication list reviewed with patient:Yes  Patient is able to obtain needed medications: Yes   ACTIVITIES OF DAILY LIVING  Is the patient able to perform his/her own ADLs: Yes  Patient is receiving home health services: no   PATIENT EDUCATION Questions/Concerns Discussed: No concerns at this time

## 2015-07-03 ENCOUNTER — Encounter (HOSPITAL_COMMUNITY)
Admission: RE | Admit: 2015-07-03 | Discharge: 2015-07-03 | Disposition: A | Discharge status: Home / Self Care | Payer: Medicare Other | Source: Ambulatory Visit | Attending: Nephrology | Admitting: Nephrology

## 2015-07-03 ENCOUNTER — Encounter (HOSPITAL_COMMUNITY): Payer: Self-pay

## 2015-07-03 DIAGNOSIS — D638 Anemia in other chronic diseases classified elsewhere: Secondary | ICD-10-CM | POA: Diagnosis not present

## 2015-07-03 DIAGNOSIS — N183 Chronic kidney disease, stage 3 (moderate): Secondary | ICD-10-CM | POA: Diagnosis not present

## 2015-07-03 LAB — FERRITIN: FERRITIN: 434 ng/mL — AB (ref 11–307)

## 2015-07-03 LAB — IRON AND TIBC
Iron: 39 ug/dL (ref 28–170)
Saturation Ratios: 15 % (ref 10.4–31.8)
TIBC: 263 ug/dL (ref 250–450)
UIBC: 224 ug/dL

## 2015-07-03 LAB — HEMOGLOBIN AND HEMATOCRIT, BLOOD
HEMATOCRIT: 35.7 % — AB (ref 36.0–46.0)
Hemoglobin: 11.1 g/dL — ABNORMAL LOW (ref 12.0–15.0)

## 2015-07-03 MED ORDER — DARBEPOETIN ALFA 100 MCG/0.5ML IJ SOSY
PREFILLED_SYRINGE | INTRAMUSCULAR | Status: AC
  Filled 2015-07-03: qty 0.5

## 2015-07-03 MED ORDER — DARBEPOETIN ALFA 100 MCG/0.5ML IJ SOSY
100.0000 ug | PREFILLED_SYRINGE | INTRAMUSCULAR | Status: DC
  Administered 2015-07-03: 100 ug via SUBCUTANEOUS

## 2015-07-03 NOTE — Progress Notes (Signed)
Results for [name], [name] (MRN [id number]) as of 07/03/2015 15:13  Labs from 07/03/2015. Aranesp 100 mcg given. Next appointment 08/07/2015   Ref. Range 07/01/2015 11:33 07/03/2015 09:25  Iron Latest Ref Range: 28-170 ug/dL  39  UIBC Latest Units: ug/dL  224  TIBC Latest Ref Range: 250-450 ug/dL  263  Saturation Ratios Latest Ref Range: 10.4-31.8 %  15  Ferritin Latest Ref Range: 11-307 ng/mL  434 (H)  Hemoglobin Latest Ref Range: 12.0-15.0 g/dL  11.1 (L)  HCT Latest Ref Range: 36.0-46.0 %  35.7 (L)

## 2015-07-04 ENCOUNTER — Ambulatory Visit (INDEPENDENT_AMBULATORY_CARE_PROVIDER_SITE_OTHER): Payer: Medicare Other | Admitting: "Endocrinology

## 2015-07-04 ENCOUNTER — Encounter: Payer: Self-pay | Admitting: "Endocrinology

## 2015-07-04 VITALS — BP 160/73 | HR 72 | Ht 62.0 in

## 2015-07-04 DIAGNOSIS — E1122 Type 2 diabetes mellitus with diabetic chronic kidney disease: Secondary | ICD-10-CM

## 2015-07-04 DIAGNOSIS — I10 Essential (primary) hypertension: Secondary | ICD-10-CM

## 2015-07-04 DIAGNOSIS — N184 Chronic kidney disease, stage 4 (severe): Secondary | ICD-10-CM

## 2015-07-04 DIAGNOSIS — E039 Hypothyroidism, unspecified: Secondary | ICD-10-CM | POA: Diagnosis not present

## 2015-07-04 NOTE — Progress Notes (Signed)
Subjective:    Patient ID: [name], female    DOB: [birth year]. Patient is to follow-up for uncontrolled type 2 diabetes with her meter and logs.  Past Medical History  Diagnosis Date  . Hypertension   . Hyperlipidemia   . GERD (gastroesophageal reflux disease)   . GAD (generalized anxiety disorder)   . Hypothyroidism   . Type II diabetes mellitus ([place])   . Right bundle branch block   . LAFB (left anterior fascicular block)   . History of rectal abscess     12-29-2004  bedside I & D  . History of GI bleed     upper 2009  due to esophagitis  &  2002  due to Mallory-Weiss tear  . History of colon polyps     benign  . Diverticulosis of colon   . History of esophagitis   . Diabetic gastroparesis ([place])   . History of gout     in issues for several years  . Sacral decubitus ulcer     since 2014- 01-02-15 remains with wound" gauze dressing changes daily.  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 Coronary artery disease   . CKD (chronic kidney disease), stage III     nephrologist--  dr Mercy Moore  . Diabetic retinopathy ([place])     bilateral --  monitored by dr [name]  . Arthritis     knees and hand/fingers. "broke back"-being evaluated for this"weakness left leg"  . Anemia in chronic renal disease     Aranesp injection --  when Hg <11, last injection 12-24-14.  Marland Kitchen Anxiety   . Constipation   . Complication of anesthesia     post-op confusion    Past Surgical History  Procedure Laterality Date  . Retinopathy surgery Bilateral 1980's?  . Orif femur fracture Left 10/09/2012    Procedure: OPEN REDUCTION INTERNAL FIXATION (ORIF) DISTAL FEMUR FRACTURE;  Surgeon: Rozanna Box, MD;  Location: [facility];  Service: Orthopedics;  Laterality: Left;  . Compression hip screw Right 05/18/2014    Procedure: COMPRESSION HIP;  Surgeon: Carole Civil, MD;  Location: [facility];  Service: Orthopedics;  Laterality: Right;  . Esophagogastroduodenoscopy  last one  01-09-2011  . Transthoracic echocardiogram  02-18-2011    mild LVH,  ef 55-60%,  grade I diastolic dysfunction/  mild TR/  RV systolic pressure increased consistant with moderate pulmonary hypertension  . Cardiovascular stress test  12-30-2004    normal perfusion study/  no ischemia or infartion/  normal LV wall function and wall motion , ef 66%  . Colonoscopy w/ polypectomy  last one 2008  . Cataract extraction w/ intraocular lens  implant, bilateral  1995  . Incision and drainage of wound N/A 09/30/2014    Procedure: IRRIGATION AND DEBRIDEMENT SACRAL WOUND, EXCISION OF PERIRECTAL TRACT WITH PLACEMENT OF ACCELL;  Surgeon: Theodoro Kos, DO;  Location: [facility];  Service: Plastics;  Laterality: N/A;  . Evaluation under anesthesia with fistulectomy N/A 01/06/2015    Procedure: EXAM UNDER ANESTHESIA , placement of seton;  Surgeon: Jackolyn Confer, MD;  Location: [facility];  Service: General;  Laterality: N/A;  . I&d extremity N/A 04/07/2015    Procedure: IRRIGATION AND DEBRIDEMENT ISCHIAL ULCER;  Surgeon: Loel Lofty Dillingham, DO;  Location: [facility];  Service: Plastics;  Laterality: N/A;  . Application of a-cell of extremity N/A 04/07/2015    Procedure: A CELL PLACMENT;  Surgeon: Loel Lofty Dillingham, DO;  Location: [facility];  Service: Plastics;  Laterality: N/A;   Social History   Social History  . Marital Status: Married    Spouse Name: [name]  . Number of Children: 0  . Years of Education: 12   Occupational History  . Retired    Social History Main Topics  . Smoking status: Never Smoker   . Smokeless tobacco: Never Used  . Alcohol Use: No  . Drug Use: No  . Sexual Activity: Not on file   Other Topics Concern  . Not on file   Social History Narrative   Lives with husband.    Caffeine use: none   Outpatient Encounter Prescriptions as of 07/04/2015  Medication Sig  . Insulin Aspart Prot & Aspart (NOVOLOG MIX 70/30 FLEXPEN [place]) Inject 5 Units into the skin daily with  breakfast. When blood glucose is above 90.  Marland Kitchen amLODipine (NORVASC) 5 MG tablet Take 1 tablet (5 mg total) by mouth daily.  Marland Kitchen atorvastatin (LIPITOR) 40 MG tablet Take 1 tablet (40 mg total) by mouth every evening.  . Blood Glucose Monitoring Suppl (ONETOUCH VERIO) w/Device KIT 1 each by Does not apply route 2 (two) times daily. Dx E11.65  . glucose blood (ONETOUCH VERIO) test strip Test 2 x daily and prn  E11.9  . levothyroxine (SYNTHROID, LEVOTHROID) 75 MCG tablet Take 1 tablet (75 mcg total) by mouth daily before breakfast.  . lidocaine (XYLOCAINE) 5 % ointment Apply small amount (3 to 5 grams)  to affected area(s) two times a day (Patient taking differently: Apply small amount (3 to 5 grams)  to affected area(s) two times a day for pain)  . metoprolol succinate (TOPROL-XL) 50 MG 24 hr tablet Take 1 tablet (50 mg total) by mouth every morning.  . ondansetron (ZOFRAN) 4 MG tablet Take 1 tablet (4 mg total) by mouth every 8 (eight) hours as needed for nausea or vomiting.  Glory Rosebush DELICA LANCETS 76H MISC Test one time per day and prn  . pantoprazole (PROTONIX) 40 MG tablet Take 40 mg by mouth 2 (two) times daily.  Marland Kitchen UNIFINE PENTIPS 32G X 4 MM MISC    No facility-administered encounter medications on file as of 07/04/2015.   ALLERGIES: Allergies  Allergen Reactions  . Aspirin Nausea And Vomiting  . Ciprofloxacin Other (See Comments)    Upset Stomach  . Codeine Nausea And Vomiting    Makes me sick   . Micardis [Telmisartan] Other (See Comments)    unknown  . Nexium [Esomeprazole Magnesium] Other (See Comments)    Causes internal bleeding  . Niaspan [Niacin Er] Other (See Comments)    Reaction is unknown  . Onglyza [Saxagliptin] Other (See Comments)    Reaction is unknown  . Other     No otc pain medications  . Rofecoxib Other (See Comments)    unknown  . Simvastatin   . Tequin [Gatifloxacin] Other (See Comments)    Reaction is unknown  . Welchol [Colesevelam Hcl]   . Amoxicillin  Rash   VACCINATION STATUS: Immunization History  Administered Date(s) Administered  . Influenza,inj,Quad PF,36+ Mos 02/16/2013, 02/27/2015  . Pneumococcal Conjugate-13 10/11/2014  . Pneumococcal Polysaccharide-23 10/10/2012  . Tdap 10/06/2012    Diabetes She presents for her follow-up diabetic visit. She has type 2 diabetes mellitus. Onset time: She was diagnosed at approximate age of 50 years . Her disease course has been worsening. There are no hypoglycemic associated symptoms. Pertinent negatives for hypoglycemia include no confusion, headaches, pallor or seizures. Associated  symptoms include fatigue and visual change. Pertinent negatives for diabetes include no chest pain, no polydipsia and no polyphagia. There are no hypoglycemic complications. Symptoms are worsening. Diabetic complications include nephropathy, peripheral neuropathy, PVD and retinopathy. Risk factors for coronary artery disease include diabetes mellitus, dyslipidemia, hypertension and sedentary lifestyle. Current diabetic treatment includes oral agent (monotherapy). She is compliant with treatment most of the time. Weight trend: Wheelchair-bound. She is following a generally unhealthy diet. When asked about meal planning, she reported none. She has not had a previous visit with a dietitian. She never participates in exercise. Her home blood glucose trend is increasing steadily (Since her last visit she had an episode of hypoglycemia to 42 went to the emergency room and was hospitalized for observation. She was discharged with no insulin. After discharge however she continued to have postprandial hyperglycemia from 300-400.). Her breakfast blood glucose range is generally 70-90 mg/dl. Her lunch blood glucose range is generally >200 mg/dl. Her dinner blood glucose range is generally >200 mg/dl. Her overall blood glucose range is >200 mg/dl. An ACE inhibitor/angiotensin II receptor blocker is being taken. Eye exam is current.  Thyroid  Problem Presents for initial visit. Onset time: 15 years. Symptoms include fatigue and visual change. Patient reports no cold intolerance, diarrhea, heat intolerance or palpitations. The symptoms have been stable. Past treatments include levothyroxine. The following procedures have not been performed: thyroidectomy.  Hypertension This is a chronic problem. The current episode started more than 1 year ago. Pertinent negatives include no chest pain, headaches, palpitations or shortness of breath. Past treatments include angiotensin blockers. Hypertensive end-organ damage includes PVD, retinopathy and a thyroid problem.      Review of Systems  Constitutional: Positive for fatigue. Negative for unexpected weight change.  HENT: Negative for trouble swallowing and voice change.   Eyes: Negative for visual disturbance.  Respiratory: Negative for cough, shortness of breath and wheezing.   Cardiovascular: Negative for chest pain, palpitations and leg swelling.  Gastrointestinal: Negative for nausea, vomiting and diarrhea.  Endocrine: Negative for cold intolerance, heat intolerance, polydipsia and polyphagia.  Musculoskeletal: Positive for myalgias, back pain, joint swelling, arthralgias and gait problem.       She is wheelchair-bound due to recent hip fracture and diffuse debilitating arthritis.  Skin: Negative for color change, pallor, rash and wound.  Neurological: Negative for seizures and headaches.  Psychiatric/Behavioral: Negative for suicidal ideas and confusion.    Objective:    BP 160/73 mmHg  Pulse 72  Ht 5\' 2"  (1.575 m)  SpO2 95%  Wt Readings from Last 3 Encounters:  07/03/15 153 lb (69.4 kg)  06/28/15 153 lb (69.4 kg)  06/27/15 152 lb 4.8 oz (69.083 kg)    Physical Exam  Constitutional: She is oriented to person, place, and time. She appears well-developed.  HENT:  Head: Normocephalic and atraumatic.  Eyes: EOM are normal.  Neck: Normal range of motion. Neck supple. No  tracheal deviation present. No thyromegaly present.  Cardiovascular: Normal rate and regular rhythm.  Exam reveals decreased pulses.   Pulses:      Dorsalis pedis pulses are 0 on the right side, and 0 on the left side.       Posterior tibial pulses are 0 on the right side, and 0 on the left side.  Pulmonary/Chest: Effort normal and breath sounds normal.  Abdominal: Soft. Bowel sounds are normal. There is no tenderness. There is no guarding.  Musculoskeletal: She exhibits no edema.       Arms: She  is wheelchair-bound due to recent hip fracture and diffuse debilitating arthritis.  Neurological: She is alert and oriented to person, place, and time. She has normal reflexes. A sensory deficit is present. No cranial nerve deficit. Coordination normal.  Skin: Skin is warm and dry. No rash noted. No erythema. No pallor.  Psychiatric: She has a normal mood and affect. Judgment normal.    Results for orders placed or performed during the hospital encounter of 07/03/15  Hemoglobin and hematocrit, blood  Result Value Ref Range   Hemoglobin 11.1 (L) 12.0 - 15.0 g/dL   HCT 13.6 (L) 85.9 - 92.3 %  Ferritin  Result Value Ref Range   Ferritin 434 (H) 11 - 307 ng/mL  Iron and TIBC  Result Value Ref Range   Iron 39 28 - 170 ug/dL   TIBC 414 436 - 016 ug/dL   Saturation Ratios 15 10.4 - 31.8 %   UIBC 224 ug/dL   Diabetic Labs (most recent): Lab Results  Component Value Date   HGBA1C 10.0* 04/02/2015   HGBA1C 6.9* 01/24/2015   HGBA1C 8.8* 08/26/2014   Lipid Panel     Component Value Date/Time   CHOL 153 10/11/2014 1159   CHOL 105 09/11/2012 1057   TRIG 170* 10/11/2014 1159   TRIG 79 09/11/2012 1057   HDL 53 10/11/2014 1159   HDL 41 09/11/2012 1057   LDLCALC 50 02/14/2014 1012   LDLCALC 48 09/11/2012 1057     Assessment & Plan:   1. Type 2 diabetes mellitus with stage 4 chronic kidney disease, without long-term current use of insulin (HCC)   - Patient has currently uncontrolled  symptomatic type 2 DM since  70 years of age,  with most recent A1c of  10 %. Recent labs reviewed.   -her  diabetes is complicated by PAD, CKD, neuropathy and patient remains at a high risk for more acute and chronic complications of diabetes which include CAD, CVA, CKD, retinopathy, and neuropathy. These are all discussed in detail with the patient.  - I have counseled the patient on diet management  by adopting a carbohydrate restricted/protein rich diet.  - Suggestion is made for patient to avoid simple carbohydrates   from their diet including Cakes , Desserts, Ice Cream,  Soda (  diet and regular) , Sweet Tea , Candies,  Chips, Cookies, Artificial Sweeteners,   and "Sugar-free" Products . This will help patient to have stable blood glucose profile and potentially avoid unintended weight gain.  - I encouraged the patient to switch to  unprocessed or minimally processed complex starch and increased protein intake (animal or plant source), fruits, and vegetables.  - Patient is advised to stick to a routine mealtimes to eat 3 meals  a day and avoid unnecessary snacks ( to snack only to correct hypoglycemia).  - The patient will be scheduled with Norm Salt, RDN, CDE for individualized DM education.  - I have approached patient with the following individualized plan to manage diabetes and patient agrees:   - She came with controlled fasting glycemia however persistently elevated postprandial blood glucose from 300-400 mg/dL before lunch, before supper and bedtime.  -She has poor social support to put her on basal bolus insulin.  -Her husband offers to help. I will lower NovoLog 70/30  to 5 units only with breakfast for pre-meal glucose above 90 mg/dL associated with monitoring of blood glucose  before meals and at bedtime and return in 1 week  -If she can't control glycemia with  one shot of insulin a day she will be switched to longer acting insulin.  if she cannot perform this therapy even  with the help of her husband, her best option is placement in skilled care facility.  -Patient is encouraged to call clinic for blood glucose levels less than 70 or above 300 mg /dl. - I will  discontinue  Tradjenta  for cost reasons.  -Patient is not a candidate for MTF, Incretin therapy.   - Patient will be considered for incretin therapy as appropriate next visit. - Patient specific target  A1c;  LDL, HDL, Triglycerides, and  Waist Circumference were discussed in detail.  2) BP/HTN: Controlled. Continue current medications including ACEI/ARB. 3) Lipids/HPL:  continue statins. 4)  Weight/Diet: CDE Consult will be initiated, she has limited ability to exercise. 5)  Hypothyroidism: She is clinically euthyroid - continue LT4 75 mcg po qam.  - We discussed about correct intake of levothyroxine, at fasting, with water, separated by at least 30 minutes from breakfast, and separated by more than 4 hours from calcium, iron, multivitamins, acid reflux medications (PPIs). -Patient is made aware of the fact that thyroid hormone replacement is needed for life, dose to be adjusted by periodic monitoring of thyroid function tests.  5) Chronic Care/Health Maintenance:  -Patient is  on ACEI/ARB and Statin medications and encouraged to continue to follow up with Ophthalmology, Podiatrist at least yearly or according to recommendations, and advised to   stay away from smoking. I have recommended yearly flu vaccine and pneumonia vaccination at least every 5 years; moderate intensity exercise for up to 150 minutes weekly; and  sleep for at least 7 hours a day.  - 20 minutes of time was spent on the care of this patient , 50% of which was applied for counseling on diabetes complications and their preventions.  - Patient to bring meter and  blood glucose logs during their next visit in 2 weeks.   - I advised patient to maintain close follow up with Eustaquio Maize, MD for primary care needs.  Follow up  plan: - Return in about 1 week (around 07/11/2015) for diabetes, high blood pressure, high cholesterol, follow up with meter and logs- no labs.  Glade Lloyd, MD Phone: [phone]  Fax: [phone]   07/04/2015, 10:47 AM

## 2015-07-05 DIAGNOSIS — I130 Hypertensive heart and chronic kidney disease with heart failure and stage 1 through stage 4 chronic kidney disease, or unspecified chronic kidney disease: Secondary | ICD-10-CM | POA: Diagnosis not present

## 2015-07-05 DIAGNOSIS — R234 Changes in skin texture: Secondary | ICD-10-CM | POA: Diagnosis not present

## 2015-07-05 DIAGNOSIS — K604 Rectal fistula: Secondary | ICD-10-CM | POA: Diagnosis not present

## 2015-07-05 DIAGNOSIS — I5030 Unspecified diastolic (congestive) heart failure: Secondary | ICD-10-CM | POA: Diagnosis not present

## 2015-07-05 DIAGNOSIS — E1122 Type 2 diabetes mellitus with diabetic chronic kidney disease: Secondary | ICD-10-CM | POA: Diagnosis not present

## 2015-07-05 DIAGNOSIS — N183 Chronic kidney disease, stage 3 (moderate): Secondary | ICD-10-CM | POA: Diagnosis not present

## 2015-07-07 ENCOUNTER — Other Ambulatory Visit: Payer: Self-pay | Admitting: *Deleted

## 2015-07-07 DIAGNOSIS — I5030 Unspecified diastolic (congestive) heart failure: Secondary | ICD-10-CM | POA: Diagnosis not present

## 2015-07-07 DIAGNOSIS — E1122 Type 2 diabetes mellitus with diabetic chronic kidney disease: Secondary | ICD-10-CM | POA: Diagnosis not present

## 2015-07-07 DIAGNOSIS — E034 Atrophy of thyroid (acquired): Secondary | ICD-10-CM

## 2015-07-07 DIAGNOSIS — I130 Hypertensive heart and chronic kidney disease with heart failure and stage 1 through stage 4 chronic kidney disease, or unspecified chronic kidney disease: Secondary | ICD-10-CM | POA: Diagnosis not present

## 2015-07-07 DIAGNOSIS — R234 Changes in skin texture: Secondary | ICD-10-CM | POA: Diagnosis not present

## 2015-07-07 DIAGNOSIS — I10 Essential (primary) hypertension: Secondary | ICD-10-CM

## 2015-07-07 DIAGNOSIS — N183 Chronic kidney disease, stage 3 (moderate): Secondary | ICD-10-CM | POA: Diagnosis not present

## 2015-07-07 DIAGNOSIS — E785 Hyperlipidemia, unspecified: Secondary | ICD-10-CM

## 2015-07-07 DIAGNOSIS — K604 Rectal fistula: Secondary | ICD-10-CM | POA: Diagnosis not present

## 2015-07-07 MED ORDER — AMLODIPINE BESYLATE 5 MG PO TABS: 5.0000 mg | ORAL_TABLET | Freq: Every day | ORAL | Status: DC

## 2015-07-07 MED ORDER — INSULIN ASPART PROT & ASPART (70-30 MIX) 100 UNIT/ML PEN
5.0000 [IU] | PEN_INJECTOR | Freq: Every day | SUBCUTANEOUS | Status: DC
Start: 2015-07-07 — End: 2015-09-04

## 2015-07-07 MED ORDER — METOPROLOL SUCCINATE ER 50 MG PO TB24: 50.0000 mg | ORAL_TABLET | Freq: Every morning | ORAL | Status: DC

## 2015-07-07 MED ORDER — ATORVASTATIN CALCIUM 40 MG PO TABS: 40.0000 mg | ORAL_TABLET | Freq: Every evening | ORAL | Status: DC

## 2015-07-07 MED ORDER — LEVOTHYROXINE SODIUM 75 MCG PO TABS: 75.0000 ug | ORAL_TABLET | Freq: Every day | ORAL | Status: DC

## 2015-07-07 MED ORDER — PANTOPRAZOLE SODIUM 40 MG PO TBEC: 40.0000 mg | DELAYED_RELEASE_TABLET | Freq: Two times a day (BID) | ORAL | Status: DC

## 2015-07-07 MED ORDER — ONDANSETRON HCL 4 MG PO TABS: 4.0000 mg | ORAL_TABLET | Freq: Three times a day (TID) | ORAL | Status: DC | PRN

## 2015-07-09 ENCOUNTER — Other Ambulatory Visit: Payer: Self-pay | Admitting: Pediatrics

## 2015-07-09 ENCOUNTER — Ambulatory Visit (INDEPENDENT_AMBULATORY_CARE_PROVIDER_SITE_OTHER): Payer: Medicare Other | Admitting: Pediatrics

## 2015-07-09 ENCOUNTER — Encounter: Payer: Self-pay | Admitting: Pediatrics

## 2015-07-09 VITALS — BP 125/53 | HR 58 | Temp 97.3°F | Ht 62.0 in | Wt 156.0 lb

## 2015-07-09 DIAGNOSIS — N189 Chronic kidney disease, unspecified: Secondary | ICD-10-CM | POA: Diagnosis not present

## 2015-07-09 DIAGNOSIS — E038 Other specified hypothyroidism: Secondary | ICD-10-CM

## 2015-07-09 DIAGNOSIS — E1122 Type 2 diabetes mellitus with diabetic chronic kidney disease: Secondary | ICD-10-CM

## 2015-07-09 DIAGNOSIS — E034 Atrophy of thyroid (acquired): Secondary | ICD-10-CM

## 2015-07-09 DIAGNOSIS — I10 Essential (primary) hypertension: Secondary | ICD-10-CM

## 2015-07-09 DIAGNOSIS — R234 Changes in skin texture: Secondary | ICD-10-CM | POA: Diagnosis not present

## 2015-07-09 DIAGNOSIS — I130 Hypertensive heart and chronic kidney disease with heart failure and stage 1 through stage 4 chronic kidney disease, or unspecified chronic kidney disease: Secondary | ICD-10-CM | POA: Diagnosis not present

## 2015-07-09 DIAGNOSIS — N184 Chronic kidney disease, stage 4 (severe): Secondary | ICD-10-CM

## 2015-07-09 DIAGNOSIS — K604 Rectal fistula: Secondary | ICD-10-CM | POA: Diagnosis not present

## 2015-07-09 DIAGNOSIS — N183 Chronic kidney disease, stage 3 (moderate): Secondary | ICD-10-CM | POA: Diagnosis not present

## 2015-07-09 DIAGNOSIS — D631 Anemia in chronic kidney disease: Secondary | ICD-10-CM | POA: Diagnosis not present

## 2015-07-09 DIAGNOSIS — E785 Hyperlipidemia, unspecified: Secondary | ICD-10-CM

## 2015-07-09 DIAGNOSIS — I5030 Unspecified diastolic (congestive) heart failure: Secondary | ICD-10-CM | POA: Diagnosis not present

## 2015-07-09 LAB — POCT GLYCOSYLATED HEMOGLOBIN (HGB A1C): Hemoglobin A1C: 7.6

## 2015-07-09 MED ORDER — LEVOTHYROXINE SODIUM 75 MCG PO TABS: 75.0000 ug | ORAL_TABLET | Freq: Every day | ORAL | Status: DC

## 2015-07-09 MED ORDER — METOPROLOL SUCCINATE ER 50 MG PO TB24: 50.0000 mg | ORAL_TABLET | Freq: Every morning | ORAL | Status: DC

## 2015-07-09 MED ORDER — ATORVASTATIN CALCIUM 40 MG PO TABS
40.0000 mg | ORAL_TABLET | Freq: Every evening | ORAL | Status: DC
Start: 2015-07-09 — End: 2016-03-05

## 2015-07-09 MED ORDER — AMLODIPINE BESYLATE 5 MG PO TABS: 5.0000 mg | ORAL_TABLET | Freq: Every day | ORAL | Status: DC

## 2015-07-09 NOTE — Progress Notes (Signed)
Subjective:    Patient ID: [name], female    DOB: 1945-10-24, 70 y.o.   MRN: [id number]  CC: Hospitalization Follow-up   HPI: [name] is a 70 y.o. female presenting for Hospitalization Follow-up   Recently admitted for hypoglycemia, lower BPs after change of medicines, discharged 07/01/2015. At discharge, potassium, lasix, losartan held. Insulin was stopped then restarted at 5u of 70/30 taken in morning if BGL is over 90.  Since getting home she has been feeling better No low blood sugars. BPs 130s-150s on metoprolol and amlodipine alone.  Depression screen [facility] 2/9 07/09/2015 06/27/2015 06/27/2015 06/23/2015 06/04/2015  Decreased Interest 0 0 0 0 0  Down, Depressed, Hopeless 0 0 0 0 0  PHQ - 2 Score 0 0 0 0 0  Altered sleeping - - - - -  Tired, decreased energy - - - - -  Change in appetite - - - - -  Feeling bad or failure about yourself  - - - - -  Trouble concentrating - - - - -  Moving slowly or fidgety/restless - - - - -  Suicidal thoughts - - - - -  PHQ-9 Score - - - - -     Relevant past medical, surgical, family and social history reviewed and updated as indicated. Interim medical history since our last visit reviewed. Allergies and medications reviewed and updated.    ROS: All systems negative other than what is in HPI  History  Smoking status  . Never Smoker   Smokeless tobacco  . Never Used    Past Medical History Patient Active Problem List   Diagnosis Date Noted  . Hypoglycemia 06/28/2015  . Hyperkalemia 06/28/2015  . Nausea and vomiting 01/23/2015  . Acute encephalopathy 01/23/2015  . CKD (chronic kidney disease), stage III   . Anemia in chronic renal disease   . Gastroesophageal reflux disease without esophagitis 10/11/2014  . Hypokalemia 10/11/2014  . Hypothyroidism 05/18/2014  . Hip fracture requiring operative repair ([place])   . Fall   . Osteoporosis 09/06/2013  . Type 2 diabetes mellitus with stage 4 chronic kidney disease  ([place]) 07/20/2013  . Edema 03/03/2011  . Obesity 02/10/2011  . Diabetic retinopathy ([place]) 10/28/2007  . Hyperlipidemia 10/28/2007  . Anxiety state 10/28/2007  . Depression 10/28/2007  . Essential hypertension 10/28/2007  . Disorder resulting from impaired renal function 10/28/2007  . COLONIC POLYPS, HYPERPLASTIC 11/17/2006  . DIVERTICULOSIS, COLON 11/17/2006    Current Outpatient Prescriptions  Medication Sig Dispense Refill  . amLODipine (NORVASC) 5 MG tablet Take 1 tablet (5 mg total) by mouth daily. 90 tablet 1  . atorvastatin (LIPITOR) 40 MG tablet Take 1 tablet (40 mg total) by mouth every evening. 90 tablet 1  . Blood Glucose Monitoring Suppl (ONETOUCH VERIO) w/Device KIT 1 each by Does not apply route 2 (two) times daily. Dx E11.65 1 kit 0  . glucose blood (ONETOUCH VERIO) test strip Test 2 x daily and prn  E11.9 100 each 5  . insulin aspart protamine - aspart (NOVOLOG MIX 70/30 FLEXPEN) (70-30) 100 UNIT/ML FlexPen Inject 0.05 mLs (5 Units total) into the skin daily with breakfast. When blood glucose is above 90. 15 mL 1  . levothyroxine (SYNTHROID, LEVOTHROID) 75 MCG tablet Take 1 tablet (75 mcg total) by mouth daily before breakfast. 90 tablet 1  . metoprolol succinate (TOPROL-XL) 50 MG 24 hr tablet Take 1 tablet (50 mg total) by mouth every morning. 90 tablet 1  . ONETOUCH  DELICA LANCETS 02I MISC Test one time per day and prn 100 each 5  . UNIFINE PENTIPS 32G X 4 MM MISC      No current facility-administered medications for this visit.       Objective:    BP 125/53 mmHg  Pulse 58  Temp(Src) 97.3 F (36.3 C) (Oral)  Ht '5\' 2"'  (1.575 m)  Wt 156 lb (70.761 kg)  BMI 28.53 kg/m2  Wt Readings from Last 3 Encounters:  07/09/15 156 lb (70.761 kg)  07/03/15 153 lb (69.4 kg)  06/28/15 153 lb (69.4 kg)    Gen: NAD, alert, cooperative with exam, NCAT EYES: EOMI, no scleral injection or icterus ENT:  OP without erythema LYMPH: no cervical LAD CV: NRRR, normal S1/S2, no  murmur, distal pulses 2+ b/l Resp: CTABL, no wheezes, normal WOB Abd: +BS, soft, NTND Ext: No edema, warm Neuro: Alert and oriented MSK: normal muscle bulk     Assessment & Plan:    Jemina was seen today for hospitalization follow-up. Physical therapy has been coming out to her home. Questions re medications answered. Sent in refills for medicines. No changes to medicines today.  Diagnoses and all orders for this visit:  Essential hypertension -     amLODipine (NORVASC) 5 MG tablet; Take 1 tablet (5 mg total) by mouth daily. -     metoprolol succinate (TOPROL-XL) 50 MG 24 hr tablet; Take 1 tablet (50 mg total) by mouth every morning. -     BMP8+EGFR  Hyperlipidemia with target LDL less than 100 -     atorvastatin (LIPITOR) 40 MG tablet; Take 1 tablet (40 mg total) by mouth every evening.  Hypothyroidism due to acquired atrophy of thyroid -     levothyroxine (SYNTHROID, LEVOTHROID) 75 MCG tablet; Take 1 tablet (75 mcg total) by mouth daily before breakfast.  Anemia in chronic renal disease -     CBC  Type 2 diabetes mellitus with stage 4 chronic kidney disease, without long-term current use of insulin (HCC) -     POCT glycosylated hemoglobin (Hb A1C)    Follow up plan: Return in about 3 months (around 10/06/2015) for Med follow up, DM2 follow up.  Assunta Found, MD [facility] 07/09/2015, 10:21 AM

## 2015-07-10 DIAGNOSIS — N184 Chronic kidney disease, stage 4 (severe): Secondary | ICD-10-CM | POA: Diagnosis not present

## 2015-07-10 DIAGNOSIS — E1122 Type 2 diabetes mellitus with diabetic chronic kidney disease: Secondary | ICD-10-CM | POA: Diagnosis not present

## 2015-07-10 DIAGNOSIS — K604 Rectal fistula: Secondary | ICD-10-CM | POA: Diagnosis not present

## 2015-07-10 DIAGNOSIS — I5030 Unspecified diastolic (congestive) heart failure: Secondary | ICD-10-CM | POA: Diagnosis not present

## 2015-07-10 DIAGNOSIS — E11649 Type 2 diabetes mellitus with hypoglycemia without coma: Secondary | ICD-10-CM | POA: Diagnosis not present

## 2015-07-10 DIAGNOSIS — I130 Hypertensive heart and chronic kidney disease with heart failure and stage 1 through stage 4 chronic kidney disease, or unspecified chronic kidney disease: Secondary | ICD-10-CM | POA: Diagnosis not present

## 2015-07-10 LAB — BMP8+EGFR
BUN/Creatinine Ratio: 17 (ref 11–26)
BUN: 24 mg/dL (ref 8–27)
CO2: 19 mmol/L (ref 18–29)
CREATININE: 1.43 mg/dL — AB (ref 0.57–1.00)
Calcium: 9.3 mg/dL (ref 8.7–10.3)
Chloride: 107 mmol/L — ABNORMAL HIGH (ref 96–106)
GFR, EST AFRICAN AMERICAN: 43 mL/min/{1.73_m2} — AB (ref >59)
GFR, EST NON AFRICAN AMERICAN: 37 mL/min/{1.73_m2} — AB (ref >59)
Glucose: 188 mg/dL — ABNORMAL HIGH (ref 65–99)
Potassium: 5.3 mmol/L — ABNORMAL HIGH (ref 3.5–5.2)
SODIUM: 142 mmol/L (ref 134–144)

## 2015-07-10 LAB — CBC
HEMOGLOBIN: 11.4 g/dL (ref 11.1–15.9)
Hematocrit: 35.9 % (ref 34.0–46.6)
MCH: 28.6 pg (ref 26.6–33.0)
MCHC: 31.8 g/dL (ref 31.5–35.7)
MCV: 90 fL (ref 79–97)
PLATELETS: 257 10*3/uL (ref 150–379)
RBC: 3.99 x10E6/uL (ref 3.77–5.28)
RDW: 15 % (ref 12.3–15.4)
WBC: 9.1 10*3/uL (ref 3.4–10.8)

## 2015-07-11 DIAGNOSIS — E1122 Type 2 diabetes mellitus with diabetic chronic kidney disease: Secondary | ICD-10-CM | POA: Diagnosis not present

## 2015-07-11 DIAGNOSIS — I130 Hypertensive heart and chronic kidney disease with heart failure and stage 1 through stage 4 chronic kidney disease, or unspecified chronic kidney disease: Secondary | ICD-10-CM | POA: Diagnosis not present

## 2015-07-11 DIAGNOSIS — N184 Chronic kidney disease, stage 4 (severe): Secondary | ICD-10-CM | POA: Diagnosis not present

## 2015-07-11 DIAGNOSIS — E11649 Type 2 diabetes mellitus with hypoglycemia without coma: Secondary | ICD-10-CM | POA: Diagnosis not present

## 2015-07-11 DIAGNOSIS — K604 Rectal fistula: Secondary | ICD-10-CM | POA: Diagnosis not present

## 2015-07-11 DIAGNOSIS — I5030 Unspecified diastolic (congestive) heart failure: Secondary | ICD-10-CM | POA: Diagnosis not present

## 2015-07-14 ENCOUNTER — Encounter (HOSPITAL_BASED_OUTPATIENT_CLINIC_OR_DEPARTMENT_OTHER): Payer: Medicare Other | Attending: Internal Medicine

## 2015-07-14 DIAGNOSIS — K604 Rectal fistula: Secondary | ICD-10-CM | POA: Insufficient documentation

## 2015-07-14 DIAGNOSIS — S31813A Puncture wound without foreign body of right buttock, initial encounter: Secondary | ICD-10-CM | POA: Diagnosis not present

## 2015-07-14 DIAGNOSIS — T8131XA Disruption of external operation (surgical) wound, not elsewhere classified, initial encounter: Secondary | ICD-10-CM | POA: Diagnosis not present

## 2015-07-14 DIAGNOSIS — E1121 Type 2 diabetes mellitus with diabetic nephropathy: Secondary | ICD-10-CM | POA: Diagnosis not present

## 2015-07-14 DIAGNOSIS — E119 Type 2 diabetes mellitus without complications: Secondary | ICD-10-CM | POA: Insufficient documentation

## 2015-07-14 DIAGNOSIS — Y838 Other surgical procedures as the cause of abnormal reaction of the patient, or of later complication, without mention of misadventure at the time of the procedure: Secondary | ICD-10-CM | POA: Diagnosis not present

## 2015-07-15 ENCOUNTER — Ambulatory Visit (INDEPENDENT_AMBULATORY_CARE_PROVIDER_SITE_OTHER): Payer: Medicare Other | Admitting: "Endocrinology

## 2015-07-15 ENCOUNTER — Encounter: Payer: Self-pay | Admitting: "Endocrinology

## 2015-07-15 VITALS — Ht 62.0 in

## 2015-07-15 DIAGNOSIS — N184 Chronic kidney disease, stage 4 (severe): Secondary | ICD-10-CM

## 2015-07-15 DIAGNOSIS — E785 Hyperlipidemia, unspecified: Secondary | ICD-10-CM

## 2015-07-15 DIAGNOSIS — E039 Hypothyroidism, unspecified: Secondary | ICD-10-CM

## 2015-07-15 DIAGNOSIS — E1122 Type 2 diabetes mellitus with diabetic chronic kidney disease: Secondary | ICD-10-CM | POA: Diagnosis not present

## 2015-07-15 DIAGNOSIS — I10 Essential (primary) hypertension: Secondary | ICD-10-CM

## 2015-07-15 NOTE — Patient Instructions (Signed)
Advice for weight management -For most of us the best way to lose weight is by diet management. Generally speaking, diet management means restricting carbohydrate consumption to minimum possible (and to unprocessed or minimally processed complex starch) and increasing protein intake (animal or plant source), fruits, and vegetables.  -Sticking to a routine mealtime to eat 3 meals a day and avoiding unnecessary snacks is shown to have a big role in weight control.  -It is better to avoid simple carbohydrates including: Cakes, Desserts, Ice Cream, Soda (diet and regular), Sweet Tea, Candies, Chips, Cookies, Artificial Sweeteners, and "Sugar-free" Products.   -Exercise: 30 minutes a day 3-4 days a week, or 150 minutes a week. Combine stretch, strength, and aerobic activities. You may seek evaluation by your heart doctor prior to initiating exercise if you have high risk for heart disease.  -If you are interested, we can schedule a visit with [name], RDN, CDE for individualized nutrition education.  

## 2015-07-15 NOTE — Progress Notes (Signed)
Subjective:    Patient ID: [name], female    DOB: [birth year]. Patient is to follow-up for uncontrolled type 2 diabetes with her meter and logs.  Past Medical History  Diagnosis Date  . Hypertension   . Hyperlipidemia   . GERD (gastroesophageal reflux disease)   . GAD (generalized anxiety disorder)   . Hypothyroidism   . Type II diabetes mellitus ([place])   . Right bundle branch block   . LAFB (left anterior fascicular block)   . History of rectal abscess     12-29-2004  bedside I & D  . History of GI bleed     upper 2009  due to esophagitis  &  2002  due to Mallory-Weiss tear  . History of colon polyps     benign  . Diverticulosis of colon   . History of esophagitis   . Diabetic gastroparesis ([place])   . History of gout     in issues for several years  . Sacral decubitus ulcer     since 2014- 01-02-15 remains with wound" gauze dressing changes daily.  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 Coronary artery disease   . CKD (chronic kidney disease), stage III     nephrologist--  dr Mercy Moore  . Diabetic retinopathy ([place])     bilateral --  monitored by dr [name]  . Arthritis     knees and hand/fingers. "broke back"-being evaluated for this"weakness left leg"  . Anemia in chronic renal disease     Aranesp injection --  when Hg <11, last injection 12-24-14.  Marland Kitchen Anxiety   . Constipation   . Complication of anesthesia     post-op confusion    Past Surgical History  Procedure Laterality Date  . Retinopathy surgery Bilateral 1980's?  . Orif femur fracture Left 10/09/2012    Procedure: OPEN REDUCTION INTERNAL FIXATION (ORIF) DISTAL FEMUR FRACTURE;  Surgeon: Rozanna Box, MD;  Location: [facility];  Service: Orthopedics;  Laterality: Left;  . Compression hip screw Right 05/18/2014    Procedure: COMPRESSION HIP;  Surgeon: Carole Civil, MD;  Location: [facility];  Service: Orthopedics;  Laterality: Right;  . Esophagogastroduodenoscopy  last one  01-09-2011  . Transthoracic echocardiogram  02-18-2011    mild LVH,  ef 55-60%,  grade I diastolic dysfunction/  mild TR/  RV systolic pressure increased consistant with moderate pulmonary hypertension  . Cardiovascular stress test  12-30-2004    normal perfusion study/  no ischemia or infartion/  normal LV wall function and wall motion , ef 66%  . Colonoscopy w/ polypectomy  last one 2008  . Cataract extraction w/ intraocular lens  implant, bilateral  1995  . Incision and drainage of wound N/A 09/30/2014    Procedure: IRRIGATION AND DEBRIDEMENT SACRAL WOUND, EXCISION OF PERIRECTAL TRACT WITH PLACEMENT OF ACCELL;  Surgeon: Theodoro Kos, DO;  Location: [facility];  Service: Plastics;  Laterality: N/A;  . Evaluation under anesthesia with fistulectomy N/A 01/06/2015    Procedure: EXAM UNDER ANESTHESIA , placement of seton;  Surgeon: Jackolyn Confer, MD;  Location: [facility];  Service: General;  Laterality: N/A;  . I&d extremity N/A 04/07/2015    Procedure: IRRIGATION AND DEBRIDEMENT ISCHIAL ULCER;  Surgeon: Loel Lofty Dillingham, DO;  Location: [facility];  Service: Plastics;  Laterality: N/A;  . Application of a-cell of extremity N/A 04/07/2015    Procedure: A CELL PLACMENT;  Surgeon: Loel Lofty Dillingham, DO;  Location: [facility];  Service: Plastics;  Laterality: N/A;   Social History   Social History  . Marital Status: Married    Spouse Name: [name]  . Number of Children: 0  . Years of Education: 12   Occupational History  . Retired    Social History Main Topics  . Smoking status: Never Smoker   . Smokeless tobacco: Never Used  . Alcohol Use: No  . Drug Use: No  . Sexual Activity: Not Asked   Other Topics Concern  . None   Social History Narrative   Lives with husband.    Caffeine use: none   Outpatient Encounter Prescriptions as of 07/15/2015  Medication Sig  . amLODipine (NORVASC) 5 MG tablet Take 1 tablet (5 mg total) by mouth daily.  Marland Kitchen atorvastatin (LIPITOR) 40 MG tablet  Take 1 tablet (40 mg total) by mouth every evening.  . Blood Glucose Monitoring Suppl (ONETOUCH VERIO) w/Device KIT 1 each by Does not apply route 2 (two) times daily. Dx E11.65  . glucose blood (ONETOUCH VERIO) test strip Test 2 x daily and prn  E11.9  . insulin aspart protamine - aspart (NOVOLOG MIX 70/30 FLEXPEN) (70-30) 100 UNIT/ML FlexPen Inject 0.05 mLs (5 Units total) into the skin daily with breakfast. When blood glucose is above 90.  . levothyroxine (SYNTHROID, LEVOTHROID) 75 MCG tablet Take 1 tablet (75 mcg total) by mouth daily before breakfast.  . metoprolol succinate (TOPROL-XL) 50 MG 24 hr tablet Take 1 tablet (50 mg total) by mouth every morning.  Glory Rosebush DELICA LANCETS 70J MISC Test one time per day and prn  . UNIFINE PENTIPS 32G X 4 MM MISC    No facility-administered encounter medications on file as of 07/15/2015.   ALLERGIES: Allergies  Allergen Reactions  . Aspirin Nausea And Vomiting  . Ciprofloxacin Other (See Comments)    Upset Stomach  . Codeine Nausea And Vomiting    Makes me sick   . Micardis [Telmisartan] Other (See Comments)    unknown  . Nexium [Esomeprazole Magnesium] Other (See Comments)    Causes internal bleeding  . Niaspan [Niacin Er] Other (See Comments)    Reaction is unknown  . Onglyza [Saxagliptin] Other (See Comments)    Reaction is unknown  . Other     No otc pain medications  . Rofecoxib Other (See Comments)    unknown  . Simvastatin   . Tequin [Gatifloxacin] Other (See Comments)    Reaction is unknown  . Welchol [Colesevelam Hcl]   . Amoxicillin Rash   VACCINATION STATUS: Immunization History  Administered Date(s) Administered  . Influenza,inj,Quad PF,36+ Mos 02/16/2013, 02/27/2015  . Pneumococcal Conjugate-13 10/11/2014  . Pneumococcal Polysaccharide-23 10/10/2012  . Tdap 10/06/2012    Diabetes She presents for her follow-up diabetic visit. She has type 2 diabetes mellitus. Onset time: She was diagnosed at approximate age of  4 years . Her disease course has been improving. There are no hypoglycemic associated symptoms. Pertinent negatives for hypoglycemia include no confusion, headaches, pallor or seizures. Associated symptoms include fatigue and visual change. Pertinent negatives for diabetes include no chest pain, no polydipsia and no polyphagia. There are no hypoglycemic complications. Symptoms are improving. Diabetic complications include nephropathy, peripheral neuropathy, PVD and retinopathy. Risk factors for coronary artery disease include diabetes mellitus, dyslipidemia, hypertension and sedentary lifestyle. Current diabetic treatment includes oral agent (monotherapy). She is compliant with treatment most of the time. Weight trend: Wheelchair-bound. She is following a generally unhealthy diet. When asked about meal  planning, she reported none. She has not had a previous visit with a dietitian. She never participates in exercise. Her home blood glucose trend is increasing steadily (Her glucose profile today is acceptable between 100- 200 mg per DL average.). Her breakfast blood glucose range is generally 110-130 mg/dl. Her lunch blood glucose range is generally 140-180 mg/dl. Her dinner blood glucose range is generally 140-180 mg/dl. Her overall blood glucose range is 140-180 mg/dl. An ACE inhibitor/angiotensin II receptor blocker is being taken. Eye exam is current.  Thyroid Problem Presents for initial visit. Onset time: 15 years. Symptoms include fatigue and visual change. Patient reports no cold intolerance, diarrhea, heat intolerance or palpitations. The symptoms have been stable. Past treatments include levothyroxine. The following procedures have not been performed: thyroidectomy.  Hypertension This is a chronic problem. The current episode started more than 1 year ago. Pertinent negatives include no chest pain, headaches, palpitations or shortness of breath. Past treatments include angiotensin blockers. Hypertensive  end-organ damage includes PVD, retinopathy and a thyroid problem.      Review of Systems  Constitutional: Positive for fatigue. Negative for unexpected weight change.  HENT: Negative for trouble swallowing and voice change.   Eyes: Negative for visual disturbance.  Respiratory: Negative for cough, shortness of breath and wheezing.   Cardiovascular: Negative for chest pain, palpitations and leg swelling.  Gastrointestinal: Negative for nausea, vomiting and diarrhea.  Endocrine: Negative for cold intolerance, heat intolerance, polydipsia and polyphagia.  Musculoskeletal: Positive for myalgias, back pain, joint swelling, arthralgias and gait problem.       She is wheelchair-bound due to recent hip fracture and diffuse debilitating arthritis.  Skin: Negative for color change, pallor, rash and wound.  Neurological: Negative for seizures and headaches.  Psychiatric/Behavioral: Negative for suicidal ideas and confusion.    Objective:    Ht _0  (1.575 m)  Wt Readings from Last 3 Encounters:  07/09/15 156 lb (70.761 kg)  07/03/15 153 lb (69.4 kg)  06/28/15 153 lb (69.4 kg)    Physical Exam  Constitutional: She is oriented to person, place, and time. She appears well-developed.  HENT:  Head: Normocephalic and atraumatic.  Eyes: EOM are normal.  Neck: Normal range of motion. Neck supple. No tracheal deviation present. No thyromegaly present.  Cardiovascular: Normal rate and regular rhythm.  Exam reveals decreased pulses.   Pulses:      Dorsalis pedis pulses are 0 on the right side, and 0 on the left side.       Posterior tibial pulses are 0 on the right side, and 0 on the left side.  Pulmonary/Chest: Effort normal and breath sounds normal.  Abdominal: Soft. Bowel sounds are normal. There is no tenderness. There is no guarding.  Musculoskeletal: She exhibits no edema.       Arms: She is wheelchair-bound due to recent hip fracture and diffuse debilitating arthritis.  Neurological: She  is alert and oriented to person, place, and time. She has normal reflexes. A sensory deficit is present. No cranial nerve deficit. Coordination normal.  Skin: Skin is warm and dry. No rash noted. No erythema. No pallor.  Psychiatric: She has a normal mood and affect. Judgment normal.    Results for orders placed or performed in visit on 07/09/15  [facility]  Result Value Ref Range   Glucose 188 (H) 65 - 99 mg/dL   BUN 24 8 - 27 mg/dL   Creatinine, Ser 1.43 (H) 0.57 - 1.00 mg/dL   GFR calc non Af Amer 37 (  L) >59 mL/min/1.73   GFR calc Af Amer 43 (L) >59 mL/min/1.73   BUN/Creatinine Ratio 17 11 - 26   Sodium 142 134 - 144 mmol/L   Potassium 5.3 (H) 3.5 - 5.2 mmol/L   Chloride 107 (H) 96 - 106 mmol/L   CO2 19 18 - 29 mmol/L   Calcium 9.3 8.7 - 10.3 mg/dL  CBC  Result Value Ref Range   WBC 9.1 3.4 - 10.8 x10E3/uL   RBC 3.99 3.77 - 5.28 x10E6/uL   Hemoglobin 11.4 11.1 - 15.9 g/dL   Hematocrit 35.9 34.0 - 46.6 %   MCV 90 79 - 97 fL   MCH 28.6 26.6 - 33.0 pg   MCHC 31.8 31.5 - 35.7 g/dL   RDW 15.0 12.3 - 15.4 %   Platelets 257 150 - 379 x10E3/uL  POCT glycosylated hemoglobin (Hb A1C)  Result Value Ref Range   Hemoglobin A1C 7.6    Diabetic Labs (most recent): Lab Results  Component Value Date   HGBA1C 7.6 07/09/2015   HGBA1C 10.0* 04/02/2015   HGBA1C 6.9* 01/24/2015   Lipid Panel     Component Value Date/Time   CHOL 153 10/11/2014 1159   CHOL 105 09/11/2012 1057   TRIG 170* 10/11/2014 1159   TRIG 79 09/11/2012 1057   HDL 53 10/11/2014 1159   HDL 41 09/11/2012 1057   LDLCALC 50 02/14/2014 1012   LDLCALC 48 09/11/2012 1057     Assessment & Plan:   1. Type 2 diabetes mellitus with stage 4 chronic kidney disease, without long-term current use of insulin ([place])   - Patient has currently uncontrolled symptomatic type 2 DM since  70 years of age,  with most recent A1c of  10 %. Recent labs reviewed.   -her  diabetes is complicated by PAD, CKD, neuropathy and patient  remains at a high risk for more acute and chronic complications of diabetes which include CAD, CVA, CKD, retinopathy, and neuropathy. These are all discussed in detail with the patient.  - I have counseled the patient on diet management  by adopting a carbohydrate restricted/protein rich diet.  - Suggestion is made for patient to avoid simple carbohydrates   from their diet including Cakes , Desserts, Ice Cream,  Soda (  diet and regular) , Sweet Tea , Candies,  Chips, Cookies, Artificial Sweeteners,   and "Sugar-free" Products . This will help patient to have stable blood glucose profile and potentially avoid unintended weight gain.  - I encouraged the patient to switch to  unprocessed or minimally processed complex starch and increased protein intake (animal or plant source), fruits, and vegetables.  - Patient is advised to stick to a routine mealtimes to eat 3 meals  a day and avoid unnecessary snacks ( to snack only to correct hypoglycemia).  - The patient will be scheduled with Jearld Fenton, RDN, CDE for individualized DM education.  - I have approached patient with the following individualized plan to manage diabetes and patient agrees:   - She came with better and safer blood glucose profile .  -She has poor social support to put her on basal bolus insulin.  -Her husband offers to help. I will lower NovoLog 70/30  to 5 units only with breakfast for pre-meal glucose above 90 mg/dL associated with monitoring of blood glucose  before meals and at bedtime. - First priority in this patient would be to avoid hypoglycemia.  -If she can't control glycemia with one shot of insulin a day she  will be switched to longer acting insulin.  if she cannot perform this therapy even with the help of her husband, her best option is placement in skilled care facility.  -Patient is encouraged to call clinic for blood glucose levels less than 70 or above 300 mg /dl. - I will  discontinue  Tradjenta  for  cost reasons.  -Patient is not a candidate for MTF, Incretin therapy.   - Patient will be considered for incretin therapy as appropriate next visit. - Patient specific target  A1c;  LDL, HDL, Triglycerides, and  Waist Circumference were discussed in detail.  2) BP/HTN: Controlled. Continue current medications including ACEI/ARB. 3) Lipids/HPL:  continue statins. 4)  Weight/Diet: CDE Consult will be initiated, she has limited ability to exercise. 5)  Hypothyroidism: She is clinically euthyroid - continue LT4 75 mcg po qam.  - We discussed about correct intake of levothyroxine, at fasting, with water, separated by at least 30 minutes from breakfast, and separated by more than 4 hours from calcium, iron, multivitamins, acid reflux medications (PPIs). -Patient is made aware of the fact that thyroid hormone replacement is needed for life, dose to be adjusted by periodic monitoring of thyroid function tests.  5) Chronic Care/Health Maintenance:  -Patient is  on ACEI/ARB and Statin medications and encouraged to continue to follow up with Ophthalmology, Podiatrist at least yearly or according to recommendations, and advised to   stay away from smoking. I have recommended yearly flu vaccine and pneumonia vaccination at least every 5 years; moderate intensity exercise for up to 150 minutes weekly; and  sleep for at least 7 hours a day.  - 20 minutes of time was spent on the care of this patient , 50% of which was applied for counseling on diabetes complications and their preventions.  - Patient to bring meter and  blood glucose logs during their next visit in 2 weeks.   - I advised patient to maintain close follow up with Eustaquio Maize, MD for primary care needs.  Follow up plan: - Return in about 6 weeks (around 08/26/2015) for diabetes, high blood pressure, high cholesterol, underactive thyroid, follow up with meter and logs- no labs.  Glade Lloyd, MD Phone: [phone]  Fax: [phone]    07/15/2015, 12:12 PM

## 2015-07-16 DIAGNOSIS — E11649 Type 2 diabetes mellitus with hypoglycemia without coma: Secondary | ICD-10-CM | POA: Diagnosis not present

## 2015-07-16 DIAGNOSIS — I130 Hypertensive heart and chronic kidney disease with heart failure and stage 1 through stage 4 chronic kidney disease, or unspecified chronic kidney disease: Secondary | ICD-10-CM | POA: Diagnosis not present

## 2015-07-16 DIAGNOSIS — N184 Chronic kidney disease, stage 4 (severe): Secondary | ICD-10-CM | POA: Diagnosis not present

## 2015-07-16 DIAGNOSIS — I5030 Unspecified diastolic (congestive) heart failure: Secondary | ICD-10-CM | POA: Diagnosis not present

## 2015-07-16 DIAGNOSIS — E1122 Type 2 diabetes mellitus with diabetic chronic kidney disease: Secondary | ICD-10-CM | POA: Diagnosis not present

## 2015-07-16 DIAGNOSIS — K604 Rectal fistula: Secondary | ICD-10-CM | POA: Diagnosis not present

## 2015-07-18 ENCOUNTER — Other Ambulatory Visit: Payer: Self-pay

## 2015-07-18 DIAGNOSIS — I5030 Unspecified diastolic (congestive) heart failure: Secondary | ICD-10-CM | POA: Diagnosis not present

## 2015-07-18 DIAGNOSIS — K604 Rectal fistula: Secondary | ICD-10-CM | POA: Diagnosis not present

## 2015-07-18 DIAGNOSIS — E1122 Type 2 diabetes mellitus with diabetic chronic kidney disease: Secondary | ICD-10-CM | POA: Diagnosis not present

## 2015-07-18 DIAGNOSIS — I130 Hypertensive heart and chronic kidney disease with heart failure and stage 1 through stage 4 chronic kidney disease, or unspecified chronic kidney disease: Secondary | ICD-10-CM | POA: Diagnosis not present

## 2015-07-18 DIAGNOSIS — E11649 Type 2 diabetes mellitus with hypoglycemia without coma: Secondary | ICD-10-CM | POA: Diagnosis not present

## 2015-07-18 DIAGNOSIS — N184 Chronic kidney disease, stage 4 (severe): Secondary | ICD-10-CM | POA: Diagnosis not present

## 2015-07-18 MED ORDER — GLUCOSE BLOOD VI STRP: ORAL_STRIP | Status: DC

## 2015-07-21 ENCOUNTER — Other Ambulatory Visit: Payer: Self-pay

## 2015-07-21 DIAGNOSIS — K604 Rectal fistula: Secondary | ICD-10-CM | POA: Diagnosis not present

## 2015-07-21 DIAGNOSIS — E11649 Type 2 diabetes mellitus with hypoglycemia without coma: Secondary | ICD-10-CM | POA: Diagnosis not present

## 2015-07-21 DIAGNOSIS — I130 Hypertensive heart and chronic kidney disease with heart failure and stage 1 through stage 4 chronic kidney disease, or unspecified chronic kidney disease: Secondary | ICD-10-CM | POA: Diagnosis not present

## 2015-07-21 DIAGNOSIS — N184 Chronic kidney disease, stage 4 (severe): Secondary | ICD-10-CM | POA: Diagnosis not present

## 2015-07-21 DIAGNOSIS — I5030 Unspecified diastolic (congestive) heart failure: Secondary | ICD-10-CM | POA: Diagnosis not present

## 2015-07-21 DIAGNOSIS — E1122 Type 2 diabetes mellitus with diabetic chronic kidney disease: Secondary | ICD-10-CM | POA: Diagnosis not present

## 2015-07-21 MED ORDER — ACCU-CHEK AVIVA PLUS W/DEVICE KIT: PACK | Status: DC

## 2015-07-21 MED ORDER — ACCU-CHEK SOFT TOUCH LANCETS MISC: Status: DC

## 2015-07-21 MED ORDER — GLUCOSE BLOOD VI STRP: ORAL_STRIP | Status: DC

## 2015-07-23 DIAGNOSIS — I130 Hypertensive heart and chronic kidney disease with heart failure and stage 1 through stage 4 chronic kidney disease, or unspecified chronic kidney disease: Secondary | ICD-10-CM | POA: Diagnosis not present

## 2015-07-23 DIAGNOSIS — E11649 Type 2 diabetes mellitus with hypoglycemia without coma: Secondary | ICD-10-CM | POA: Diagnosis not present

## 2015-07-23 DIAGNOSIS — E1122 Type 2 diabetes mellitus with diabetic chronic kidney disease: Secondary | ICD-10-CM | POA: Diagnosis not present

## 2015-07-23 DIAGNOSIS — N184 Chronic kidney disease, stage 4 (severe): Secondary | ICD-10-CM | POA: Diagnosis not present

## 2015-07-23 DIAGNOSIS — K604 Rectal fistula: Secondary | ICD-10-CM | POA: Diagnosis not present

## 2015-07-23 DIAGNOSIS — I5030 Unspecified diastolic (congestive) heart failure: Secondary | ICD-10-CM | POA: Diagnosis not present

## 2015-07-25 DIAGNOSIS — I130 Hypertensive heart and chronic kidney disease with heart failure and stage 1 through stage 4 chronic kidney disease, or unspecified chronic kidney disease: Secondary | ICD-10-CM | POA: Diagnosis not present

## 2015-07-25 DIAGNOSIS — N184 Chronic kidney disease, stage 4 (severe): Secondary | ICD-10-CM | POA: Diagnosis not present

## 2015-07-25 DIAGNOSIS — K604 Rectal fistula: Secondary | ICD-10-CM | POA: Diagnosis not present

## 2015-07-25 DIAGNOSIS — E11649 Type 2 diabetes mellitus with hypoglycemia without coma: Secondary | ICD-10-CM | POA: Diagnosis not present

## 2015-07-25 DIAGNOSIS — E1122 Type 2 diabetes mellitus with diabetic chronic kidney disease: Secondary | ICD-10-CM | POA: Diagnosis not present

## 2015-07-25 DIAGNOSIS — I5030 Unspecified diastolic (congestive) heart failure: Secondary | ICD-10-CM | POA: Diagnosis not present

## 2015-07-28 ENCOUNTER — Encounter (HOSPITAL_BASED_OUTPATIENT_CLINIC_OR_DEPARTMENT_OTHER): Payer: Medicare Other | Attending: Internal Medicine

## 2015-07-28 DIAGNOSIS — E119 Type 2 diabetes mellitus without complications: Secondary | ICD-10-CM | POA: Insufficient documentation

## 2015-07-28 DIAGNOSIS — T8131XA Disruption of external operation (surgical) wound, not elsewhere classified, initial encounter: Secondary | ICD-10-CM | POA: Diagnosis not present

## 2015-07-28 DIAGNOSIS — Y838 Other surgical procedures as the cause of abnormal reaction of the patient, or of later complication, without mention of misadventure at the time of the procedure: Secondary | ICD-10-CM | POA: Insufficient documentation

## 2015-07-28 DIAGNOSIS — L98411 Non-pressure chronic ulcer of buttock limited to breakdown of skin: Secondary | ICD-10-CM | POA: Diagnosis not present

## 2015-07-29 ENCOUNTER — Telehealth: Payer: Self-pay | Admitting: "Endocrinology

## 2015-07-29 NOTE — Telephone Encounter (Signed)
pt had high readings - I can't find where I wrote them down, will you please call them back

## 2015-07-29 NOTE — Telephone Encounter (Signed)
Pt states they have had high BG readings.   Date Before breakfast Before lunch Before supper Bedtime  3/4 136  241   3/5 160  269   3/6 131  279   3/7 166  213     Pt taking: Novolog 70/30 5 units qam

## 2015-07-29 NOTE — Telephone Encounter (Signed)
Pt notified and agrees. 

## 2015-07-29 NOTE — Telephone Encounter (Signed)
She can increase Novolog 70/30 to 8 units with breakfast only , start to test at lunch as well and call back on Friday with logs.

## 2015-07-30 DIAGNOSIS — N184 Chronic kidney disease, stage 4 (severe): Secondary | ICD-10-CM | POA: Diagnosis not present

## 2015-07-30 DIAGNOSIS — I130 Hypertensive heart and chronic kidney disease with heart failure and stage 1 through stage 4 chronic kidney disease, or unspecified chronic kidney disease: Secondary | ICD-10-CM | POA: Diagnosis not present

## 2015-07-30 DIAGNOSIS — I5030 Unspecified diastolic (congestive) heart failure: Secondary | ICD-10-CM | POA: Diagnosis not present

## 2015-07-30 DIAGNOSIS — E11649 Type 2 diabetes mellitus with hypoglycemia without coma: Secondary | ICD-10-CM | POA: Diagnosis not present

## 2015-07-30 DIAGNOSIS — K604 Rectal fistula: Secondary | ICD-10-CM | POA: Diagnosis not present

## 2015-07-30 DIAGNOSIS — E1122 Type 2 diabetes mellitus with diabetic chronic kidney disease: Secondary | ICD-10-CM | POA: Diagnosis not present

## 2015-07-31 ENCOUNTER — Other Ambulatory Visit: Payer: Self-pay | Admitting: Nurse Practitioner

## 2015-07-31 DIAGNOSIS — N184 Chronic kidney disease, stage 4 (severe): Secondary | ICD-10-CM | POA: Diagnosis not present

## 2015-07-31 DIAGNOSIS — I130 Hypertensive heart and chronic kidney disease with heart failure and stage 1 through stage 4 chronic kidney disease, or unspecified chronic kidney disease: Secondary | ICD-10-CM | POA: Diagnosis not present

## 2015-07-31 DIAGNOSIS — K604 Rectal fistula: Secondary | ICD-10-CM | POA: Diagnosis not present

## 2015-07-31 DIAGNOSIS — I5030 Unspecified diastolic (congestive) heart failure: Secondary | ICD-10-CM | POA: Diagnosis not present

## 2015-07-31 DIAGNOSIS — E11649 Type 2 diabetes mellitus with hypoglycemia without coma: Secondary | ICD-10-CM | POA: Diagnosis not present

## 2015-07-31 DIAGNOSIS — E1122 Type 2 diabetes mellitus with diabetic chronic kidney disease: Secondary | ICD-10-CM | POA: Diagnosis not present

## 2015-07-31 NOTE — Telephone Encounter (Signed)
Do not see on med list? 

## 2015-08-04 DIAGNOSIS — L98411 Non-pressure chronic ulcer of buttock limited to breakdown of skin: Secondary | ICD-10-CM | POA: Diagnosis not present

## 2015-08-04 DIAGNOSIS — E119 Type 2 diabetes mellitus without complications: Secondary | ICD-10-CM | POA: Diagnosis not present

## 2015-08-04 DIAGNOSIS — T8131XA Disruption of external operation (surgical) wound, not elsewhere classified, initial encounter: Secondary | ICD-10-CM | POA: Diagnosis not present

## 2015-08-05 DIAGNOSIS — E11649 Type 2 diabetes mellitus with hypoglycemia without coma: Secondary | ICD-10-CM | POA: Diagnosis not present

## 2015-08-05 DIAGNOSIS — I130 Hypertensive heart and chronic kidney disease with heart failure and stage 1 through stage 4 chronic kidney disease, or unspecified chronic kidney disease: Secondary | ICD-10-CM | POA: Diagnosis not present

## 2015-08-05 DIAGNOSIS — N184 Chronic kidney disease, stage 4 (severe): Secondary | ICD-10-CM | POA: Diagnosis not present

## 2015-08-05 DIAGNOSIS — K604 Rectal fistula: Secondary | ICD-10-CM | POA: Diagnosis not present

## 2015-08-05 DIAGNOSIS — E1122 Type 2 diabetes mellitus with diabetic chronic kidney disease: Secondary | ICD-10-CM | POA: Diagnosis not present

## 2015-08-05 DIAGNOSIS — I5030 Unspecified diastolic (congestive) heart failure: Secondary | ICD-10-CM | POA: Diagnosis not present

## 2015-08-06 DIAGNOSIS — N184 Chronic kidney disease, stage 4 (severe): Secondary | ICD-10-CM | POA: Diagnosis not present

## 2015-08-06 DIAGNOSIS — E11649 Type 2 diabetes mellitus with hypoglycemia without coma: Secondary | ICD-10-CM | POA: Diagnosis not present

## 2015-08-06 DIAGNOSIS — E1122 Type 2 diabetes mellitus with diabetic chronic kidney disease: Secondary | ICD-10-CM | POA: Diagnosis not present

## 2015-08-06 DIAGNOSIS — I130 Hypertensive heart and chronic kidney disease with heart failure and stage 1 through stage 4 chronic kidney disease, or unspecified chronic kidney disease: Secondary | ICD-10-CM | POA: Diagnosis not present

## 2015-08-06 DIAGNOSIS — I5030 Unspecified diastolic (congestive) heart failure: Secondary | ICD-10-CM | POA: Diagnosis not present

## 2015-08-06 DIAGNOSIS — K604 Rectal fistula: Secondary | ICD-10-CM | POA: Diagnosis not present

## 2015-08-07 ENCOUNTER — Encounter (HOSPITAL_COMMUNITY)
Admission: RE | Admit: 2015-08-07 | Discharge: 2015-08-07 | Disposition: A | Discharge status: Home / Self Care | Payer: Medicare Other | Source: Ambulatory Visit | Attending: Nephrology | Admitting: Nephrology

## 2015-08-07 DIAGNOSIS — N183 Chronic kidney disease, stage 3 (moderate): Secondary | ICD-10-CM | POA: Diagnosis not present

## 2015-08-07 DIAGNOSIS — D638 Anemia in other chronic diseases classified elsewhere: Secondary | ICD-10-CM | POA: Insufficient documentation

## 2015-08-07 LAB — HEMOGLOBIN AND HEMATOCRIT, BLOOD
HEMATOCRIT: 37.4 % (ref 36.0–46.0)
Hemoglobin: 11.9 g/dL — ABNORMAL LOW (ref 12.0–15.0)

## 2015-08-07 MED ORDER — SODIUM CHLORIDE 0.9 % IV SOLN
INTRAVENOUS | Status: DC
  Administered 2015-08-07: 10 mL/h via INTRAVENOUS

## 2015-08-07 MED ORDER — DARBEPOETIN ALFA 100 MCG/0.5ML IJ SOSY
100.0000 ug | PREFILLED_SYRINGE | INTRAMUSCULAR | Status: DC
  Administered 2015-08-07: 100 ug via SUBCUTANEOUS
  Filled 2015-08-07: qty 0.5

## 2015-08-07 MED ORDER — FERUMOXYTOL INJECTION 510 MG/17 ML
510.0000 mg | Freq: Once | INTRAVENOUS | Status: AC
Start: 2015-08-07 — End: 2015-08-07
  Administered 2015-08-07: 510 mg via INTRAVENOUS
  Filled 2015-08-07: qty 17

## 2015-08-07 NOTE — Progress Notes (Signed)
Results for [name], [name] (MRN [id number]) as of 08/07/2015 12:57  Ref. Range 08/07/2015 10:00  Hemoglobin Latest Ref Range: 12.0-15.0 g/dL 11.9 (L)  HCT Latest Ref Range: 36.0-46.0 % 37.4  Aranesp 100 mcg sq administered

## 2015-08-08 ENCOUNTER — Ambulatory Visit: Payer: Medicare Other | Admitting: "Endocrinology

## 2015-08-08 ENCOUNTER — Ambulatory Visit: Payer: Medicare Other | Admitting: Nutrition

## 2015-08-08 DIAGNOSIS — I5030 Unspecified diastolic (congestive) heart failure: Secondary | ICD-10-CM | POA: Diagnosis not present

## 2015-08-08 DIAGNOSIS — E1122 Type 2 diabetes mellitus with diabetic chronic kidney disease: Secondary | ICD-10-CM | POA: Diagnosis not present

## 2015-08-08 DIAGNOSIS — N184 Chronic kidney disease, stage 4 (severe): Secondary | ICD-10-CM | POA: Diagnosis not present

## 2015-08-08 DIAGNOSIS — I130 Hypertensive heart and chronic kidney disease with heart failure and stage 1 through stage 4 chronic kidney disease, or unspecified chronic kidney disease: Secondary | ICD-10-CM | POA: Diagnosis not present

## 2015-08-08 DIAGNOSIS — K604 Rectal fistula: Secondary | ICD-10-CM | POA: Diagnosis not present

## 2015-08-08 DIAGNOSIS — E11649 Type 2 diabetes mellitus with hypoglycemia without coma: Secondary | ICD-10-CM | POA: Diagnosis not present

## 2015-08-11 DIAGNOSIS — E119 Type 2 diabetes mellitus without complications: Secondary | ICD-10-CM | POA: Diagnosis not present

## 2015-08-11 DIAGNOSIS — T8131XA Disruption of external operation (surgical) wound, not elsewhere classified, initial encounter: Secondary | ICD-10-CM | POA: Diagnosis not present

## 2015-08-13 DIAGNOSIS — E11649 Type 2 diabetes mellitus with hypoglycemia without coma: Secondary | ICD-10-CM | POA: Diagnosis not present

## 2015-08-13 DIAGNOSIS — E1122 Type 2 diabetes mellitus with diabetic chronic kidney disease: Secondary | ICD-10-CM | POA: Diagnosis not present

## 2015-08-13 DIAGNOSIS — I5030 Unspecified diastolic (congestive) heart failure: Secondary | ICD-10-CM | POA: Diagnosis not present

## 2015-08-13 DIAGNOSIS — N184 Chronic kidney disease, stage 4 (severe): Secondary | ICD-10-CM | POA: Diagnosis not present

## 2015-08-13 DIAGNOSIS — K604 Rectal fistula: Secondary | ICD-10-CM | POA: Diagnosis not present

## 2015-08-13 DIAGNOSIS — I130 Hypertensive heart and chronic kidney disease with heart failure and stage 1 through stage 4 chronic kidney disease, or unspecified chronic kidney disease: Secondary | ICD-10-CM | POA: Diagnosis not present

## 2015-08-15 DIAGNOSIS — I130 Hypertensive heart and chronic kidney disease with heart failure and stage 1 through stage 4 chronic kidney disease, or unspecified chronic kidney disease: Secondary | ICD-10-CM | POA: Diagnosis not present

## 2015-08-15 DIAGNOSIS — E1122 Type 2 diabetes mellitus with diabetic chronic kidney disease: Secondary | ICD-10-CM | POA: Diagnosis not present

## 2015-08-15 DIAGNOSIS — N184 Chronic kidney disease, stage 4 (severe): Secondary | ICD-10-CM | POA: Diagnosis not present

## 2015-08-15 DIAGNOSIS — E11649 Type 2 diabetes mellitus with hypoglycemia without coma: Secondary | ICD-10-CM | POA: Diagnosis not present

## 2015-08-15 DIAGNOSIS — I5030 Unspecified diastolic (congestive) heart failure: Secondary | ICD-10-CM | POA: Diagnosis not present

## 2015-08-15 DIAGNOSIS — K604 Rectal fistula: Secondary | ICD-10-CM | POA: Diagnosis not present

## 2015-08-18 DIAGNOSIS — T8131XA Disruption of external operation (surgical) wound, not elsewhere classified, initial encounter: Secondary | ICD-10-CM | POA: Diagnosis not present

## 2015-08-18 DIAGNOSIS — E119 Type 2 diabetes mellitus without complications: Secondary | ICD-10-CM | POA: Diagnosis not present

## 2015-08-19 DIAGNOSIS — I5030 Unspecified diastolic (congestive) heart failure: Secondary | ICD-10-CM | POA: Diagnosis not present

## 2015-08-19 DIAGNOSIS — K604 Rectal fistula: Secondary | ICD-10-CM | POA: Diagnosis not present

## 2015-08-19 DIAGNOSIS — N184 Chronic kidney disease, stage 4 (severe): Secondary | ICD-10-CM | POA: Diagnosis not present

## 2015-08-19 DIAGNOSIS — E1122 Type 2 diabetes mellitus with diabetic chronic kidney disease: Secondary | ICD-10-CM | POA: Diagnosis not present

## 2015-08-19 DIAGNOSIS — E11649 Type 2 diabetes mellitus with hypoglycemia without coma: Secondary | ICD-10-CM | POA: Diagnosis not present

## 2015-08-19 DIAGNOSIS — I130 Hypertensive heart and chronic kidney disease with heart failure and stage 1 through stage 4 chronic kidney disease, or unspecified chronic kidney disease: Secondary | ICD-10-CM | POA: Diagnosis not present

## 2015-08-21 DIAGNOSIS — E1122 Type 2 diabetes mellitus with diabetic chronic kidney disease: Secondary | ICD-10-CM | POA: Diagnosis not present

## 2015-08-21 DIAGNOSIS — E11649 Type 2 diabetes mellitus with hypoglycemia without coma: Secondary | ICD-10-CM | POA: Diagnosis not present

## 2015-08-21 DIAGNOSIS — I130 Hypertensive heart and chronic kidney disease with heart failure and stage 1 through stage 4 chronic kidney disease, or unspecified chronic kidney disease: Secondary | ICD-10-CM | POA: Diagnosis not present

## 2015-08-21 DIAGNOSIS — K604 Rectal fistula: Secondary | ICD-10-CM | POA: Diagnosis not present

## 2015-08-21 DIAGNOSIS — N184 Chronic kidney disease, stage 4 (severe): Secondary | ICD-10-CM | POA: Diagnosis not present

## 2015-08-21 DIAGNOSIS — I5030 Unspecified diastolic (congestive) heart failure: Secondary | ICD-10-CM | POA: Diagnosis not present

## 2015-08-26 DIAGNOSIS — E1122 Type 2 diabetes mellitus with diabetic chronic kidney disease: Secondary | ICD-10-CM | POA: Diagnosis not present

## 2015-08-26 DIAGNOSIS — N184 Chronic kidney disease, stage 4 (severe): Secondary | ICD-10-CM | POA: Diagnosis not present

## 2015-08-26 DIAGNOSIS — K604 Rectal fistula: Secondary | ICD-10-CM | POA: Diagnosis not present

## 2015-08-26 DIAGNOSIS — I5030 Unspecified diastolic (congestive) heart failure: Secondary | ICD-10-CM | POA: Diagnosis not present

## 2015-08-26 DIAGNOSIS — I130 Hypertensive heart and chronic kidney disease with heart failure and stage 1 through stage 4 chronic kidney disease, or unspecified chronic kidney disease: Secondary | ICD-10-CM | POA: Diagnosis not present

## 2015-08-26 DIAGNOSIS — E11649 Type 2 diabetes mellitus with hypoglycemia without coma: Secondary | ICD-10-CM | POA: Diagnosis not present

## 2015-08-27 DIAGNOSIS — I5030 Unspecified diastolic (congestive) heart failure: Secondary | ICD-10-CM | POA: Diagnosis not present

## 2015-08-27 DIAGNOSIS — I130 Hypertensive heart and chronic kidney disease with heart failure and stage 1 through stage 4 chronic kidney disease, or unspecified chronic kidney disease: Secondary | ICD-10-CM | POA: Diagnosis not present

## 2015-08-27 DIAGNOSIS — E11649 Type 2 diabetes mellitus with hypoglycemia without coma: Secondary | ICD-10-CM | POA: Diagnosis not present

## 2015-08-27 DIAGNOSIS — N184 Chronic kidney disease, stage 4 (severe): Secondary | ICD-10-CM | POA: Diagnosis not present

## 2015-08-27 DIAGNOSIS — E1122 Type 2 diabetes mellitus with diabetic chronic kidney disease: Secondary | ICD-10-CM | POA: Diagnosis not present

## 2015-08-27 DIAGNOSIS — K604 Rectal fistula: Secondary | ICD-10-CM | POA: Diagnosis not present

## 2015-08-28 DIAGNOSIS — I5030 Unspecified diastolic (congestive) heart failure: Secondary | ICD-10-CM | POA: Diagnosis not present

## 2015-08-28 DIAGNOSIS — N184 Chronic kidney disease, stage 4 (severe): Secondary | ICD-10-CM | POA: Diagnosis not present

## 2015-08-28 DIAGNOSIS — I130 Hypertensive heart and chronic kidney disease with heart failure and stage 1 through stage 4 chronic kidney disease, or unspecified chronic kidney disease: Secondary | ICD-10-CM | POA: Diagnosis not present

## 2015-08-28 DIAGNOSIS — E11649 Type 2 diabetes mellitus with hypoglycemia without coma: Secondary | ICD-10-CM | POA: Diagnosis not present

## 2015-08-28 DIAGNOSIS — K604 Rectal fistula: Secondary | ICD-10-CM | POA: Diagnosis not present

## 2015-08-28 DIAGNOSIS — E1122 Type 2 diabetes mellitus with diabetic chronic kidney disease: Secondary | ICD-10-CM | POA: Diagnosis not present

## 2015-08-29 DIAGNOSIS — K604 Rectal fistula: Secondary | ICD-10-CM | POA: Diagnosis not present

## 2015-08-29 DIAGNOSIS — N184 Chronic kidney disease, stage 4 (severe): Secondary | ICD-10-CM | POA: Diagnosis not present

## 2015-08-29 DIAGNOSIS — E1122 Type 2 diabetes mellitus with diabetic chronic kidney disease: Secondary | ICD-10-CM | POA: Diagnosis not present

## 2015-08-29 DIAGNOSIS — I5030 Unspecified diastolic (congestive) heart failure: Secondary | ICD-10-CM | POA: Diagnosis not present

## 2015-08-29 DIAGNOSIS — E11649 Type 2 diabetes mellitus with hypoglycemia without coma: Secondary | ICD-10-CM | POA: Diagnosis not present

## 2015-08-29 DIAGNOSIS — I130 Hypertensive heart and chronic kidney disease with heart failure and stage 1 through stage 4 chronic kidney disease, or unspecified chronic kidney disease: Secondary | ICD-10-CM | POA: Diagnosis not present

## 2015-08-30 DIAGNOSIS — N184 Chronic kidney disease, stage 4 (severe): Secondary | ICD-10-CM | POA: Diagnosis not present

## 2015-08-30 DIAGNOSIS — I5030 Unspecified diastolic (congestive) heart failure: Secondary | ICD-10-CM | POA: Diagnosis not present

## 2015-08-30 DIAGNOSIS — I130 Hypertensive heart and chronic kidney disease with heart failure and stage 1 through stage 4 chronic kidney disease, or unspecified chronic kidney disease: Secondary | ICD-10-CM | POA: Diagnosis not present

## 2015-08-30 DIAGNOSIS — E11649 Type 2 diabetes mellitus with hypoglycemia without coma: Secondary | ICD-10-CM | POA: Diagnosis not present

## 2015-08-30 DIAGNOSIS — E1122 Type 2 diabetes mellitus with diabetic chronic kidney disease: Secondary | ICD-10-CM | POA: Diagnosis not present

## 2015-08-30 DIAGNOSIS — K604 Rectal fistula: Secondary | ICD-10-CM | POA: Diagnosis not present

## 2015-09-01 ENCOUNTER — Ambulatory Visit: Payer: Medicare Other | Admitting: "Endocrinology

## 2015-09-01 ENCOUNTER — Encounter (HOSPITAL_BASED_OUTPATIENT_CLINIC_OR_DEPARTMENT_OTHER): Payer: Medicare Other | Attending: Internal Medicine

## 2015-09-01 DIAGNOSIS — Y838 Other surgical procedures as the cause of abnormal reaction of the patient, or of later complication, without mention of misadventure at the time of the procedure: Secondary | ICD-10-CM | POA: Insufficient documentation

## 2015-09-01 DIAGNOSIS — T8189XA Other complications of procedures, not elsewhere classified, initial encounter: Secondary | ICD-10-CM | POA: Insufficient documentation

## 2015-09-01 DIAGNOSIS — T8131XA Disruption of external operation (surgical) wound, not elsewhere classified, initial encounter: Secondary | ICD-10-CM | POA: Diagnosis not present

## 2015-09-02 DIAGNOSIS — N184 Chronic kidney disease, stage 4 (severe): Secondary | ICD-10-CM | POA: Diagnosis not present

## 2015-09-02 DIAGNOSIS — E11649 Type 2 diabetes mellitus with hypoglycemia without coma: Secondary | ICD-10-CM | POA: Diagnosis not present

## 2015-09-02 DIAGNOSIS — I5030 Unspecified diastolic (congestive) heart failure: Secondary | ICD-10-CM | POA: Diagnosis not present

## 2015-09-02 DIAGNOSIS — K604 Rectal fistula: Secondary | ICD-10-CM | POA: Diagnosis not present

## 2015-09-02 DIAGNOSIS — E1122 Type 2 diabetes mellitus with diabetic chronic kidney disease: Secondary | ICD-10-CM | POA: Diagnosis not present

## 2015-09-02 DIAGNOSIS — I130 Hypertensive heart and chronic kidney disease with heart failure and stage 1 through stage 4 chronic kidney disease, or unspecified chronic kidney disease: Secondary | ICD-10-CM | POA: Diagnosis not present

## 2015-09-03 DIAGNOSIS — I130 Hypertensive heart and chronic kidney disease with heart failure and stage 1 through stage 4 chronic kidney disease, or unspecified chronic kidney disease: Secondary | ICD-10-CM | POA: Diagnosis not present

## 2015-09-03 DIAGNOSIS — E1122 Type 2 diabetes mellitus with diabetic chronic kidney disease: Secondary | ICD-10-CM | POA: Diagnosis not present

## 2015-09-03 DIAGNOSIS — E11649 Type 2 diabetes mellitus with hypoglycemia without coma: Secondary | ICD-10-CM | POA: Diagnosis not present

## 2015-09-03 DIAGNOSIS — N184 Chronic kidney disease, stage 4 (severe): Secondary | ICD-10-CM | POA: Diagnosis not present

## 2015-09-03 DIAGNOSIS — I5030 Unspecified diastolic (congestive) heart failure: Secondary | ICD-10-CM | POA: Diagnosis not present

## 2015-09-03 DIAGNOSIS — K604 Rectal fistula: Secondary | ICD-10-CM | POA: Diagnosis not present

## 2015-09-04 ENCOUNTER — Ambulatory Visit (INDEPENDENT_AMBULATORY_CARE_PROVIDER_SITE_OTHER): Payer: Medicare Other | Admitting: "Endocrinology

## 2015-09-04 ENCOUNTER — Encounter: Payer: Self-pay | Admitting: "Endocrinology

## 2015-09-04 VITALS — BP 144/76 | HR 64 | Ht 62.0 in

## 2015-09-04 DIAGNOSIS — E039 Hypothyroidism, unspecified: Secondary | ICD-10-CM

## 2015-09-04 DIAGNOSIS — I10 Essential (primary) hypertension: Secondary | ICD-10-CM | POA: Diagnosis not present

## 2015-09-04 DIAGNOSIS — E1122 Type 2 diabetes mellitus with diabetic chronic kidney disease: Secondary | ICD-10-CM | POA: Diagnosis not present

## 2015-09-04 DIAGNOSIS — E785 Hyperlipidemia, unspecified: Secondary | ICD-10-CM

## 2015-09-04 DIAGNOSIS — N184 Chronic kidney disease, stage 4 (severe): Secondary | ICD-10-CM | POA: Diagnosis not present

## 2015-09-04 MED ORDER — INSULIN ASPART PROT & ASPART (70-30 MIX) 100 UNIT/ML PEN: 8.0000 [IU] | PEN_INJECTOR | Freq: Every day | SUBCUTANEOUS | Status: DC

## 2015-09-04 MED ORDER — GLUCOSE BLOOD VI STRP: ORAL_STRIP | Status: DC

## 2015-09-04 NOTE — Progress Notes (Signed)
Subjective:    Patient ID: [name], female    DOB: [birth year]. Patient is to follow-up for uncontrolled type 2 diabetes with her meter and logs.  Past Medical History  Diagnosis Date  . Hypertension   . Hyperlipidemia   . GERD (gastroesophageal reflux disease)   . GAD (generalized anxiety disorder)   . Hypothyroidism   . Type II diabetes mellitus ([place])   . Right bundle branch block   . LAFB (left anterior fascicular block)   . History of rectal abscess     12-29-2004  bedside I & D  . History of GI bleed     upper 2009  due to esophagitis  &  2002  due to Mallory-Weiss tear  . History of colon polyps     benign  . Diverticulosis of colon   . History of esophagitis   . Diabetic gastroparesis ([place])   . History of gout     in issues for several years  . Sacral decubitus ulcer     since 2014- 01-02-15 remains with wound" gauze dressing changes daily.  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 Coronary artery disease   . CKD (chronic kidney disease), stage III     nephrologist--  dr Mercy Moore  . Diabetic retinopathy ([place])     bilateral --  monitored by dr [name]  . Arthritis     knees and hand/fingers. "broke back"-being evaluated for this"weakness left leg"  . Anemia in chronic renal disease     Aranesp injection --  when Hg <11, last injection 12-24-14.  Marland Kitchen Anxiety   . Constipation   . Complication of anesthesia     post-op confusion    Past Surgical History  Procedure Laterality Date  . Retinopathy surgery Bilateral 1980's?  . Orif femur fracture Left 10/09/2012    Procedure: OPEN REDUCTION INTERNAL FIXATION (ORIF) DISTAL FEMUR FRACTURE;  Surgeon: Rozanna Box, MD;  Location: [facility];  Service: Orthopedics;  Laterality: Left;  . Compression hip screw Right 05/18/2014    Procedure: COMPRESSION HIP;  Surgeon: Carole Civil, MD;  Location: [facility];  Service: Orthopedics;  Laterality: Right;  . Esophagogastroduodenoscopy  last one  01-09-2011  . Transthoracic echocardiogram  02-18-2011    mild LVH,  ef 55-60%,  grade I diastolic dysfunction/  mild TR/  RV systolic pressure increased consistant with moderate pulmonary hypertension  . Cardiovascular stress test  12-30-2004    normal perfusion study/  no ischemia or infartion/  normal LV wall function and wall motion , ef 66%  . Colonoscopy w/ polypectomy  last one 2008  . Cataract extraction w/ intraocular lens  implant, bilateral  1995  . Incision and drainage of wound N/A 09/30/2014    Procedure: IRRIGATION AND DEBRIDEMENT SACRAL WOUND, EXCISION OF PERIRECTAL TRACT WITH PLACEMENT OF ACCELL;  Surgeon: Theodoro Kos, DO;  Location: [facility];  Service: Plastics;  Laterality: N/A;  . Evaluation under anesthesia with fistulectomy N/A 01/06/2015    Procedure: EXAM UNDER ANESTHESIA , placement of seton;  Surgeon: Jackolyn Confer, MD;  Location: [facility];  Service: General;  Laterality: N/A;  . I&d extremity N/A 04/07/2015    Procedure: IRRIGATION AND DEBRIDEMENT ISCHIAL ULCER;  Surgeon: Loel Lofty Dillingham, DO;  Location: [facility];  Service: Plastics;  Laterality: N/A;  . Application of a-cell of extremity N/A 04/07/2015    Procedure: A CELL PLACMENT;  Surgeon: Loel Lofty Dillingham, DO;  Location: [facility];  Service: Plastics;  Laterality: N/A;   Social History   Social History  . Marital Status: Married    Spouse Name: [name]  . Number of Children: 0  . Years of Education: 12   Occupational History  . Retired    Social History Main Topics  . Smoking status: Never Smoker   . Smokeless tobacco: Never Used  . Alcohol Use: No  . Drug Use: No  . Sexual Activity: Not Asked   Other Topics Concern  . None   Social History Narrative   Lives with husband.    Caffeine use: none   Outpatient Encounter Prescriptions as of 09/04/2015  Medication Sig  . insulin aspart protamine - aspart (NOVOLOG MIX 70/30 FLEXPEN) (70-30) 100 UNIT/ML FlexPen Inject 0.08 mLs (8 Units  total) into the skin daily with breakfast. Only if blood glucose is above 90  . [DISCONTINUED] Insulin Aspart Prot & Aspart (NOVOLOG MIX 70/30 FLEXPEN [place]) Inject 8 Units into the skin daily with breakfast. Only if blood glucose is above 90  . amLODipine (NORVASC) 5 MG tablet Take 1 tablet (5 mg total) by mouth daily.  Marland Kitchen atorvastatin (LIPITOR) 40 MG tablet Take 1 tablet (40 mg total) by mouth every evening.  . Blood Glucose Monitoring Suppl (ACCU-CHEK AVIVA PLUS) w/Device KIT Use as directed 4 times daily  . glucose blood (ACCU-CHEK AVIVA) test strip Use as instructed 4 x daily. E11.65  . Lancets (ACCU-CHEK SOFT TOUCH) lancets Use as instructed 4 x daily, E11.65  . levothyroxine (SYNTHROID, LEVOTHROID) 75 MCG tablet Take 1 tablet (75 mcg total) by mouth daily before breakfast.  . metoprolol succinate (TOPROL-XL) 50 MG 24 hr tablet Take 1 tablet (50 mg total) by mouth every morning.  Marland Kitchen UNIFINE PENTIPS 32G X 4 MM MISC   . [DISCONTINUED] glucose blood (ACCU-CHEK AVIVA) test strip Use as instructed 4 x daily. E11.65  . [DISCONTINUED] insulin aspart protamine - aspart (NOVOLOG MIX 70/30 FLEXPEN) (70-30) 100 UNIT/ML FlexPen Inject 0.05 mLs (5 Units total) into the skin daily with breakfast. When blood glucose is above 90.   No facility-administered encounter medications on file as of 09/04/2015.   ALLERGIES: Allergies  Allergen Reactions  . Aspirin Nausea And Vomiting  . Ciprofloxacin Other (See Comments)    Upset Stomach  . Codeine Nausea And Vomiting    Makes me sick   . Micardis [Telmisartan] Other (See Comments)    unknown  . Nexium [Esomeprazole Magnesium] Other (See Comments)    Causes internal bleeding  . Niaspan [Niacin Er] Other (See Comments)    Reaction is unknown  . Onglyza [Saxagliptin] Other (See Comments)    Reaction is unknown  . Other     No otc pain medications  . Rofecoxib Other (See Comments)    unknown  . Simvastatin   . Tequin [Gatifloxacin] Other (See Comments)     Reaction is unknown  . Welchol [Colesevelam Hcl]   . Amoxicillin Rash   VACCINATION STATUS: Immunization History  Administered Date(s) Administered  . Influenza,inj,Quad PF,36+ Mos 02/16/2013, 02/27/2015  . Pneumococcal Conjugate-13 10/11/2014  . Pneumococcal Polysaccharide-23 10/10/2012  . Tdap 10/06/2012    Diabetes She presents for her follow-up diabetic visit. She has type 2 diabetes mellitus. Onset time: She was diagnosed at approximate age of 68 years . Her disease course has been improving. There are no hypoglycemic associated symptoms. Pertinent negatives for hypoglycemia include no confusion, headaches, pallor or seizures. Associated symptoms include fatigue and visual change. Pertinent  negatives for diabetes include no chest pain, no polydipsia and no polyphagia. There are no hypoglycemic complications. Symptoms are improving. Diabetic complications include nephropathy, peripheral neuropathy, PVD and retinopathy. Risk factors for coronary artery disease include diabetes mellitus, dyslipidemia, hypertension and sedentary lifestyle. Current diabetic treatment includes oral agent (monotherapy). She is compliant with treatment most of the time. Weight trend: Wheelchair-bound. She is following a generally unhealthy diet. When asked about meal planning, she reported none. She has not had a previous visit with a dietitian. She never participates in exercise. Her home blood glucose trend is increasing steadily (Her glucose profile today is acceptable between 100- 200 mg per DL average.). Her breakfast blood glucose range is generally 110-130 mg/dl. Her lunch blood glucose range is generally 140-180 mg/dl. Her dinner blood glucose range is generally 140-180 mg/dl. Her overall blood glucose range is 140-180 mg/dl. An ACE inhibitor/angiotensin II receptor blocker is being taken. Eye exam is current.  Thyroid Problem Presents for initial visit. Onset time: 15 years. Symptoms include fatigue and visual  change. Patient reports no cold intolerance, diarrhea, heat intolerance or palpitations. The symptoms have been stable. Past treatments include levothyroxine. The following procedures have not been performed: thyroidectomy.  Hypertension This is a chronic problem. The current episode started more than 1 year ago. Pertinent negatives include no chest pain, headaches, palpitations or shortness of breath. Past treatments include angiotensin blockers. Hypertensive end-organ damage includes PVD, retinopathy and a thyroid problem.      Review of Systems  Constitutional: Positive for fatigue. Negative for unexpected weight change.  HENT: Negative for trouble swallowing and voice change.   Eyes: Negative for visual disturbance.  Respiratory: Negative for cough, shortness of breath and wheezing.   Cardiovascular: Negative for chest pain, palpitations and leg swelling.  Gastrointestinal: Negative for nausea, vomiting and diarrhea.  Endocrine: Negative for cold intolerance, heat intolerance, polydipsia and polyphagia.  Musculoskeletal: Positive for myalgias, back pain, joint swelling, arthralgias and gait problem.       She is wheelchair-bound due to recent hip fracture and diffuse debilitating arthritis.  Skin: Negative for color change, pallor, rash and wound.  Neurological: Negative for seizures and headaches.  Psychiatric/Behavioral: Negative for suicidal ideas and confusion.    Objective:    BP 144/76 mmHg  Pulse 64  Ht '5\' 2"'$  (1.575 m)  SpO2 98%  Wt Readings from Last 3 Encounters:  07/09/15 156 lb (70.761 kg)  07/03/15 153 lb (69.4 kg)  06/28/15 153 lb (69.4 kg)    Physical Exam  Constitutional: She is oriented to person, place, and time. She appears well-developed.  HENT:  Head: Normocephalic and atraumatic.  Eyes: EOM are normal.  Neck: Normal range of motion. Neck supple. No tracheal deviation present. No thyromegaly present.  Cardiovascular: Normal rate and regular rhythm.  Exam  reveals decreased pulses.   Pulses:      Dorsalis pedis pulses are 0 on the right side, and 0 on the left side.       Posterior tibial pulses are 0 on the right side, and 0 on the left side.  Pulmonary/Chest: Effort normal and breath sounds normal.  Abdominal: Soft. Bowel sounds are normal. There is no tenderness. There is no guarding.  Musculoskeletal: She exhibits no edema.       Arms: She is wheelchair-bound due to recent hip fracture and diffuse debilitating arthritis.  Neurological: She is alert and oriented to person, place, and time. She has normal reflexes. A sensory deficit is present. No cranial nerve  deficit. Coordination normal.  Skin: Skin is warm and dry. No rash noted. No erythema. No pallor.  Psychiatric: She has a normal mood and affect. Judgment normal.    Results for orders placed or performed during the hospital encounter of 08/07/15  Hemoglobin and hematocrit, blood  Result Value Ref Range   Hemoglobin 11.9 (L) 12.0 - 15.0 g/dL   HCT 37.4 36.0 - 46.0 %   Diabetic Labs (most recent): Lab Results  Component Value Date   HGBA1C 7.6 07/09/2015   HGBA1C 10.0* 04/02/2015   HGBA1C 6.9* 01/24/2015   Lipid Panel     Component Value Date/Time   CHOL 153 10/11/2014 1159   CHOL 105 09/11/2012 1057   TRIG 170* 10/11/2014 1159   TRIG 79 09/11/2012 1057   HDL 53 10/11/2014 1159   HDL 41 09/11/2012 1057   LDLCALC 50 02/14/2014 1012   LDLCALC 48 09/11/2012 1057     Assessment & Plan:   1. Type 2 diabetes mellitus with stage 4 chronic kidney disease, without long-term current use of insulin ([place])   - Patient has currently uncontrolled symptomatic type 2 DM since  70 years of age,  with most recent A1c of 7.6% improving from  10 %. Recent labs reviewed.   -her  diabetes is complicated by PAD, CKD, neuropathy and patient remains at a high risk for more acute and chronic complications of diabetes which include CAD, CVA, CKD, retinopathy, and neuropathy. These are all  discussed in detail with the patient.  - I have counseled the patient on diet management  by adopting a carbohydrate restricted/protein rich diet.  - Suggestion is made for patient to avoid simple carbohydrates   from their diet including Cakes , Desserts, Ice Cream,  Soda (  diet and regular) , Sweet Tea , Candies,  Chips, Cookies, Artificial Sweeteners,   and "Sugar-free" Products . This will help patient to have stable blood glucose profile and potentially avoid unintended weight gain.  - I encouraged the patient to switch to  unprocessed or minimally processed complex starch and increased protein intake (animal or plant source), fruits, and vegetables.  - Patient is advised to stick to a routine mealtimes to eat 3 meals  a day and avoid unnecessary snacks ( to snack only to correct hypoglycemia).  - The patient will be scheduled with Jearld Fenton, RDN, CDE for individualized DM education.  - I have approached patient with the following individualized plan to manage diabetes and patient agrees:   - She came with better and safer blood glucose profile .  -She has poor social support to put her on basal bolus insulin.  -Her husband offers to help. I will increase NovoLog 70/30  to 8 units only with breakfast for pre-meal glucose above 90 mg/dL associated with monitoring of blood glucose  before meals and at bedtime. -Husband is a neck caretaker now, I advised him to avoid injecting insulin at bedtime. - First priority in this patient would be to avoid hypoglycemia.  -If she can't control glycemia with one shot of insulin a day she will be switched to longer acting insulin.  if she cannot perform this therapy even with the help of her husband, her best option is placement in skilled care facility.  -Patient is encouraged to call clinic for blood glucose levels less than 70 or above 300 mg /dl. - I will  discontinue  Tradjenta  for cost reasons.  -Patient is not a candidate for MTF,  Incretin therapy.  -  Patient specific target  A1c;  LDL, HDL, Triglycerides, and  Waist Circumference were discussed in detail.  2) BP/HTN: Controlled. Continue current medications including ACEI/ARB. 3) Lipids/HPL:  continue statins. 4)  Weight/Diet: CDE Consult will be initiated, she has limited ability to exercise. 5)  Hypothyroidism:  - continue LT4 75 mcg po qam.  - We discussed about correct intake of levothyroxine, at fasting, with water, separated by at least 30 minutes from breakfast, and separated by more than 4 hours from calcium, iron, multivitamins, acid reflux medications (PPIs). -Patient is made aware of the fact that thyroid hormone replacement is needed for life, dose to be adjusted by periodic monitoring of thyroid function tests.  5) Chronic Care/Health Maintenance:  -Patient is  on ACEI/ARB and Statin medications and encouraged to continue to follow up with Ophthalmology, Podiatrist at least yearly or according to recommendations, and advised to   stay away from smoking. I have recommended yearly flu vaccine and pneumonia vaccination at least every 5 years; moderate intensity exercise for up to 150 minutes weekly; and  sleep for at least 7 hours a day.  - 20 minutes of time was spent on the care of this patient , 50% of which was applied for counseling on diabetes complications and their preventions.  - Patient to bring meter and  blood glucose logs during their next visit in 2 weeks.   - I advised patient to maintain close follow up with Eustaquio Maize, MD for primary care needs.  Follow up plan: - Return in about 10 weeks (around 11/13/2015) for diabetes, high blood pressure, high cholesterol, underactive thyroid, follow up with pre-visit labs, meter, and logs.  Glade Lloyd, MD Phone: [phone]  Fax: [phone]   09/04/2015, 2:37 PM

## 2015-09-05 DIAGNOSIS — E11649 Type 2 diabetes mellitus with hypoglycemia without coma: Secondary | ICD-10-CM | POA: Diagnosis not present

## 2015-09-05 DIAGNOSIS — K604 Rectal fistula: Secondary | ICD-10-CM | POA: Diagnosis not present

## 2015-09-05 DIAGNOSIS — E1122 Type 2 diabetes mellitus with diabetic chronic kidney disease: Secondary | ICD-10-CM | POA: Diagnosis not present

## 2015-09-05 DIAGNOSIS — I5030 Unspecified diastolic (congestive) heart failure: Secondary | ICD-10-CM | POA: Diagnosis not present

## 2015-09-05 DIAGNOSIS — N184 Chronic kidney disease, stage 4 (severe): Secondary | ICD-10-CM | POA: Diagnosis not present

## 2015-09-05 DIAGNOSIS — I130 Hypertensive heart and chronic kidney disease with heart failure and stage 1 through stage 4 chronic kidney disease, or unspecified chronic kidney disease: Secondary | ICD-10-CM | POA: Diagnosis not present

## 2015-09-08 DIAGNOSIS — I130 Hypertensive heart and chronic kidney disease with heart failure and stage 1 through stage 4 chronic kidney disease, or unspecified chronic kidney disease: Secondary | ICD-10-CM | POA: Diagnosis not present

## 2015-09-08 DIAGNOSIS — I5030 Unspecified diastolic (congestive) heart failure: Secondary | ICD-10-CM | POA: Diagnosis not present

## 2015-09-08 DIAGNOSIS — K604 Rectal fistula: Secondary | ICD-10-CM | POA: Diagnosis not present

## 2015-09-08 DIAGNOSIS — E11649 Type 2 diabetes mellitus with hypoglycemia without coma: Secondary | ICD-10-CM | POA: Diagnosis not present

## 2015-09-08 DIAGNOSIS — R2689 Other abnormalities of gait and mobility: Secondary | ICD-10-CM | POA: Diagnosis not present

## 2015-09-08 DIAGNOSIS — E1122 Type 2 diabetes mellitus with diabetic chronic kidney disease: Secondary | ICD-10-CM | POA: Diagnosis not present

## 2015-09-10 DIAGNOSIS — E1122 Type 2 diabetes mellitus with diabetic chronic kidney disease: Secondary | ICD-10-CM | POA: Diagnosis not present

## 2015-09-10 DIAGNOSIS — R2689 Other abnormalities of gait and mobility: Secondary | ICD-10-CM | POA: Diagnosis not present

## 2015-09-10 DIAGNOSIS — K604 Rectal fistula: Secondary | ICD-10-CM | POA: Diagnosis not present

## 2015-09-10 DIAGNOSIS — I5030 Unspecified diastolic (congestive) heart failure: Secondary | ICD-10-CM | POA: Diagnosis not present

## 2015-09-10 DIAGNOSIS — E11649 Type 2 diabetes mellitus with hypoglycemia without coma: Secondary | ICD-10-CM | POA: Diagnosis not present

## 2015-09-10 DIAGNOSIS — I130 Hypertensive heart and chronic kidney disease with heart failure and stage 1 through stage 4 chronic kidney disease, or unspecified chronic kidney disease: Secondary | ICD-10-CM | POA: Diagnosis not present

## 2015-09-11 ENCOUNTER — Encounter (HOSPITAL_COMMUNITY)
Admission: RE | Admit: 2015-09-11 | Discharge: 2015-09-11 | Disposition: A | Discharge status: Home / Self Care | Payer: Medicare Other | Source: Ambulatory Visit | Attending: Nephrology | Admitting: Nephrology

## 2015-09-11 ENCOUNTER — Other Ambulatory Visit: Payer: Self-pay

## 2015-09-11 DIAGNOSIS — N183 Chronic kidney disease, stage 3 (moderate): Secondary | ICD-10-CM | POA: Insufficient documentation

## 2015-09-11 DIAGNOSIS — D638 Anemia in other chronic diseases classified elsewhere: Secondary | ICD-10-CM | POA: Insufficient documentation

## 2015-09-11 LAB — FERRITIN: Ferritin: 480 ng/mL — ABNORMAL HIGH (ref 11–307)

## 2015-09-11 LAB — IRON AND TIBC
IRON: 60 ug/dL (ref 28–170)
Saturation Ratios: 27 % (ref 10.4–31.8)
TIBC: 225 ug/dL — ABNORMAL LOW (ref 250–450)
UIBC: 165 ug/dL

## 2015-09-11 LAB — HEMOGLOBIN AND HEMATOCRIT, BLOOD
HCT: 38.5 % (ref 36.0–46.0)
Hemoglobin: 12.3 g/dL (ref 12.0–15.0)

## 2015-09-11 MED ORDER — INSULIN ASPART PROT & ASPART (70-30 MIX) 100 UNIT/ML PEN
8.0000 [IU] | PEN_INJECTOR | Freq: Every day | SUBCUTANEOUS | Status: DC
Start: 2015-09-11 — End: 2016-09-20

## 2015-09-11 NOTE — Progress Notes (Signed)
hgb 12.3 therefore med not given per order. Pt to return in 2 weeks for repeat testing.

## 2015-09-12 DIAGNOSIS — R2689 Other abnormalities of gait and mobility: Secondary | ICD-10-CM | POA: Diagnosis not present

## 2015-09-12 DIAGNOSIS — I130 Hypertensive heart and chronic kidney disease with heart failure and stage 1 through stage 4 chronic kidney disease, or unspecified chronic kidney disease: Secondary | ICD-10-CM | POA: Diagnosis not present

## 2015-09-12 DIAGNOSIS — I5030 Unspecified diastolic (congestive) heart failure: Secondary | ICD-10-CM | POA: Diagnosis not present

## 2015-09-12 DIAGNOSIS — E11649 Type 2 diabetes mellitus with hypoglycemia without coma: Secondary | ICD-10-CM | POA: Diagnosis not present

## 2015-09-12 DIAGNOSIS — E1122 Type 2 diabetes mellitus with diabetic chronic kidney disease: Secondary | ICD-10-CM | POA: Diagnosis not present

## 2015-09-12 DIAGNOSIS — K604 Rectal fistula: Secondary | ICD-10-CM | POA: Diagnosis not present

## 2015-09-15 DIAGNOSIS — T8189XA Other complications of procedures, not elsewhere classified, initial encounter: Secondary | ICD-10-CM | POA: Diagnosis not present

## 2015-09-15 DIAGNOSIS — S31809A Unspecified open wound of unspecified buttock, initial encounter: Secondary | ICD-10-CM | POA: Diagnosis not present

## 2015-09-17 DIAGNOSIS — E1122 Type 2 diabetes mellitus with diabetic chronic kidney disease: Secondary | ICD-10-CM | POA: Diagnosis not present

## 2015-09-17 DIAGNOSIS — E11649 Type 2 diabetes mellitus with hypoglycemia without coma: Secondary | ICD-10-CM | POA: Diagnosis not present

## 2015-09-17 DIAGNOSIS — R2689 Other abnormalities of gait and mobility: Secondary | ICD-10-CM | POA: Diagnosis not present

## 2015-09-17 DIAGNOSIS — K604 Rectal fistula: Secondary | ICD-10-CM | POA: Diagnosis not present

## 2015-09-17 DIAGNOSIS — I130 Hypertensive heart and chronic kidney disease with heart failure and stage 1 through stage 4 chronic kidney disease, or unspecified chronic kidney disease: Secondary | ICD-10-CM | POA: Diagnosis not present

## 2015-09-17 DIAGNOSIS — I5030 Unspecified diastolic (congestive) heart failure: Secondary | ICD-10-CM | POA: Diagnosis not present

## 2015-09-17 NOTE — Progress Notes (Signed)
Results for [name], [name] (MRN [id number]) as of 09/17/2015 16:26  Ref. Range 09/11/2015 09:58  Iron Latest Ref Range: 28-170 ug/dL 60  UIBC Latest Units: ug/dL 165  TIBC Latest Ref Range: 250-450 ug/dL 225 (L)  Saturation Ratios Latest Ref Range: 10.4-31.8 % 27  Ferritin Latest Ref Range: 11-307 ng/mL 480 (H)  Hemoglobin Latest Ref Range: 12.0-15.0 g/dL 12.3  HCT Latest Ref Range: 36.0-46.0 % 38.5

## 2015-09-19 DIAGNOSIS — K604 Rectal fistula: Secondary | ICD-10-CM | POA: Diagnosis not present

## 2015-09-19 DIAGNOSIS — I130 Hypertensive heart and chronic kidney disease with heart failure and stage 1 through stage 4 chronic kidney disease, or unspecified chronic kidney disease: Secondary | ICD-10-CM | POA: Diagnosis not present

## 2015-09-19 DIAGNOSIS — E11649 Type 2 diabetes mellitus with hypoglycemia without coma: Secondary | ICD-10-CM | POA: Diagnosis not present

## 2015-09-19 DIAGNOSIS — E1122 Type 2 diabetes mellitus with diabetic chronic kidney disease: Secondary | ICD-10-CM | POA: Diagnosis not present

## 2015-09-19 DIAGNOSIS — I5030 Unspecified diastolic (congestive) heart failure: Secondary | ICD-10-CM | POA: Diagnosis not present

## 2015-09-19 DIAGNOSIS — R2689 Other abnormalities of gait and mobility: Secondary | ICD-10-CM | POA: Diagnosis not present

## 2015-09-22 DIAGNOSIS — I130 Hypertensive heart and chronic kidney disease with heart failure and stage 1 through stage 4 chronic kidney disease, or unspecified chronic kidney disease: Secondary | ICD-10-CM | POA: Diagnosis not present

## 2015-09-22 DIAGNOSIS — E1122 Type 2 diabetes mellitus with diabetic chronic kidney disease: Secondary | ICD-10-CM | POA: Diagnosis not present

## 2015-09-22 DIAGNOSIS — I5030 Unspecified diastolic (congestive) heart failure: Secondary | ICD-10-CM | POA: Diagnosis not present

## 2015-09-22 DIAGNOSIS — R2689 Other abnormalities of gait and mobility: Secondary | ICD-10-CM | POA: Diagnosis not present

## 2015-09-22 DIAGNOSIS — E11649 Type 2 diabetes mellitus with hypoglycemia without coma: Secondary | ICD-10-CM | POA: Diagnosis not present

## 2015-09-22 DIAGNOSIS — K604 Rectal fistula: Secondary | ICD-10-CM | POA: Diagnosis not present

## 2015-09-24 DIAGNOSIS — E11649 Type 2 diabetes mellitus with hypoglycemia without coma: Secondary | ICD-10-CM | POA: Diagnosis not present

## 2015-09-24 DIAGNOSIS — E1122 Type 2 diabetes mellitus with diabetic chronic kidney disease: Secondary | ICD-10-CM | POA: Diagnosis not present

## 2015-09-24 DIAGNOSIS — I130 Hypertensive heart and chronic kidney disease with heart failure and stage 1 through stage 4 chronic kidney disease, or unspecified chronic kidney disease: Secondary | ICD-10-CM | POA: Diagnosis not present

## 2015-09-24 DIAGNOSIS — K604 Rectal fistula: Secondary | ICD-10-CM | POA: Diagnosis not present

## 2015-09-24 DIAGNOSIS — I5030 Unspecified diastolic (congestive) heart failure: Secondary | ICD-10-CM | POA: Diagnosis not present

## 2015-09-24 DIAGNOSIS — R2689 Other abnormalities of gait and mobility: Secondary | ICD-10-CM | POA: Diagnosis not present

## 2015-09-25 ENCOUNTER — Encounter (HOSPITAL_COMMUNITY)
Admission: RE | Admit: 2015-09-25 | Discharge: 2015-09-25 | Disposition: A | Discharge status: Home / Self Care | Payer: Medicare Other | Source: Ambulatory Visit | Attending: Nephrology | Admitting: Nephrology

## 2015-09-25 ENCOUNTER — Encounter (HOSPITAL_COMMUNITY): Payer: Self-pay

## 2015-09-25 DIAGNOSIS — N183 Chronic kidney disease, stage 3 (moderate): Secondary | ICD-10-CM | POA: Insufficient documentation

## 2015-09-25 DIAGNOSIS — D638 Anemia in other chronic diseases classified elsewhere: Secondary | ICD-10-CM | POA: Diagnosis not present

## 2015-09-25 LAB — HEMOGLOBIN AND HEMATOCRIT, BLOOD
HCT: 39 % (ref 36.0–46.0)
HEMOGLOBIN: 12.5 g/dL (ref 12.0–15.0)

## 2015-09-25 NOTE — Progress Notes (Signed)
Results for [name], [name] (MRN [id number]) as of 09/25/2015 10:02  Ref. Range 09/25/2015 09:50  Hemoglobin Latest Ref Range: 12.0-15.0 g/dL 12.5  HCT Latest Ref Range: 36.0-46.0 % 39.0   Aranesp not given per order parameters.

## 2015-09-26 DIAGNOSIS — I130 Hypertensive heart and chronic kidney disease with heart failure and stage 1 through stage 4 chronic kidney disease, or unspecified chronic kidney disease: Secondary | ICD-10-CM | POA: Diagnosis not present

## 2015-09-26 DIAGNOSIS — E1122 Type 2 diabetes mellitus with diabetic chronic kidney disease: Secondary | ICD-10-CM | POA: Diagnosis not present

## 2015-09-26 DIAGNOSIS — I5030 Unspecified diastolic (congestive) heart failure: Secondary | ICD-10-CM | POA: Diagnosis not present

## 2015-09-26 DIAGNOSIS — K604 Rectal fistula: Secondary | ICD-10-CM | POA: Diagnosis not present

## 2015-09-26 DIAGNOSIS — R2689 Other abnormalities of gait and mobility: Secondary | ICD-10-CM | POA: Diagnosis not present

## 2015-09-26 DIAGNOSIS — E11649 Type 2 diabetes mellitus with hypoglycemia without coma: Secondary | ICD-10-CM | POA: Diagnosis not present

## 2015-09-29 DIAGNOSIS — E1122 Type 2 diabetes mellitus with diabetic chronic kidney disease: Secondary | ICD-10-CM | POA: Diagnosis not present

## 2015-09-29 DIAGNOSIS — I5030 Unspecified diastolic (congestive) heart failure: Secondary | ICD-10-CM | POA: Diagnosis not present

## 2015-09-29 DIAGNOSIS — R809 Proteinuria, unspecified: Secondary | ICD-10-CM | POA: Diagnosis not present

## 2015-09-29 DIAGNOSIS — N183 Chronic kidney disease, stage 3 (moderate): Secondary | ICD-10-CM | POA: Diagnosis not present

## 2015-09-29 DIAGNOSIS — R2689 Other abnormalities of gait and mobility: Secondary | ICD-10-CM | POA: Diagnosis not present

## 2015-09-29 DIAGNOSIS — I129 Hypertensive chronic kidney disease with stage 1 through stage 4 chronic kidney disease, or unspecified chronic kidney disease: Secondary | ICD-10-CM | POA: Diagnosis not present

## 2015-09-29 DIAGNOSIS — N2581 Secondary hyperparathyroidism of renal origin: Secondary | ICD-10-CM | POA: Diagnosis not present

## 2015-09-29 DIAGNOSIS — D631 Anemia in chronic kidney disease: Secondary | ICD-10-CM | POA: Diagnosis not present

## 2015-09-29 DIAGNOSIS — E11649 Type 2 diabetes mellitus with hypoglycemia without coma: Secondary | ICD-10-CM | POA: Diagnosis not present

## 2015-09-29 DIAGNOSIS — N184 Chronic kidney disease, stage 4 (severe): Secondary | ICD-10-CM | POA: Diagnosis not present

## 2015-09-29 DIAGNOSIS — K604 Rectal fistula: Secondary | ICD-10-CM | POA: Diagnosis not present

## 2015-09-29 DIAGNOSIS — I130 Hypertensive heart and chronic kidney disease with heart failure and stage 1 through stage 4 chronic kidney disease, or unspecified chronic kidney disease: Secondary | ICD-10-CM | POA: Diagnosis not present

## 2015-10-01 DIAGNOSIS — I5030 Unspecified diastolic (congestive) heart failure: Secondary | ICD-10-CM | POA: Diagnosis not present

## 2015-10-01 DIAGNOSIS — R2689 Other abnormalities of gait and mobility: Secondary | ICD-10-CM | POA: Diagnosis not present

## 2015-10-01 DIAGNOSIS — E1122 Type 2 diabetes mellitus with diabetic chronic kidney disease: Secondary | ICD-10-CM | POA: Diagnosis not present

## 2015-10-01 DIAGNOSIS — I130 Hypertensive heart and chronic kidney disease with heart failure and stage 1 through stage 4 chronic kidney disease, or unspecified chronic kidney disease: Secondary | ICD-10-CM | POA: Diagnosis not present

## 2015-10-01 DIAGNOSIS — K604 Rectal fistula: Secondary | ICD-10-CM | POA: Diagnosis not present

## 2015-10-01 DIAGNOSIS — E11649 Type 2 diabetes mellitus with hypoglycemia without coma: Secondary | ICD-10-CM | POA: Diagnosis not present

## 2015-10-02 ENCOUNTER — Encounter (HOSPITAL_BASED_OUTPATIENT_CLINIC_OR_DEPARTMENT_OTHER): Payer: Medicare Other | Attending: Internal Medicine

## 2015-10-02 DIAGNOSIS — T8131XA Disruption of external operation (surgical) wound, not elsewhere classified, initial encounter: Secondary | ICD-10-CM | POA: Diagnosis not present

## 2015-10-02 DIAGNOSIS — K604 Rectal fistula: Secondary | ICD-10-CM | POA: Diagnosis not present

## 2015-10-02 DIAGNOSIS — S31809S Unspecified open wound of unspecified buttock, sequela: Secondary | ICD-10-CM | POA: Diagnosis not present

## 2015-10-02 DIAGNOSIS — E1121 Type 2 diabetes mellitus with diabetic nephropathy: Secondary | ICD-10-CM | POA: Insufficient documentation

## 2015-10-02 DIAGNOSIS — Y839 Surgical procedure, unspecified as the cause of abnormal reaction of the patient, or of later complication, without mention of misadventure at the time of the procedure: Secondary | ICD-10-CM | POA: Insufficient documentation

## 2015-10-02 DIAGNOSIS — L89622 Pressure ulcer of left heel, stage 2: Secondary | ICD-10-CM | POA: Insufficient documentation

## 2015-10-03 ENCOUNTER — Telehealth: Payer: Self-pay | Admitting: "Endocrinology

## 2015-10-03 NOTE — Telephone Encounter (Signed)
needs strips called into [facility] in [place]

## 2015-10-06 DIAGNOSIS — I5030 Unspecified diastolic (congestive) heart failure: Secondary | ICD-10-CM | POA: Diagnosis not present

## 2015-10-06 DIAGNOSIS — I130 Hypertensive heart and chronic kidney disease with heart failure and stage 1 through stage 4 chronic kidney disease, or unspecified chronic kidney disease: Secondary | ICD-10-CM | POA: Diagnosis not present

## 2015-10-06 DIAGNOSIS — K604 Rectal fistula: Secondary | ICD-10-CM | POA: Diagnosis not present

## 2015-10-06 DIAGNOSIS — R2689 Other abnormalities of gait and mobility: Secondary | ICD-10-CM | POA: Diagnosis not present

## 2015-10-06 DIAGNOSIS — E11649 Type 2 diabetes mellitus with hypoglycemia without coma: Secondary | ICD-10-CM | POA: Diagnosis not present

## 2015-10-06 DIAGNOSIS — E1122 Type 2 diabetes mellitus with diabetic chronic kidney disease: Secondary | ICD-10-CM | POA: Diagnosis not present

## 2015-10-06 MED ORDER — GLUCOSE BLOOD VI STRP: ORAL_STRIP | Status: DC

## 2015-10-07 DIAGNOSIS — K604 Rectal fistula: Secondary | ICD-10-CM | POA: Diagnosis not present

## 2015-10-07 DIAGNOSIS — I130 Hypertensive heart and chronic kidney disease with heart failure and stage 1 through stage 4 chronic kidney disease, or unspecified chronic kidney disease: Secondary | ICD-10-CM | POA: Diagnosis not present

## 2015-10-07 DIAGNOSIS — R2689 Other abnormalities of gait and mobility: Secondary | ICD-10-CM | POA: Diagnosis not present

## 2015-10-07 DIAGNOSIS — E1122 Type 2 diabetes mellitus with diabetic chronic kidney disease: Secondary | ICD-10-CM | POA: Diagnosis not present

## 2015-10-07 DIAGNOSIS — I5030 Unspecified diastolic (congestive) heart failure: Secondary | ICD-10-CM | POA: Diagnosis not present

## 2015-10-07 DIAGNOSIS — E11649 Type 2 diabetes mellitus with hypoglycemia without coma: Secondary | ICD-10-CM | POA: Diagnosis not present

## 2015-10-08 DIAGNOSIS — K604 Rectal fistula: Secondary | ICD-10-CM | POA: Diagnosis not present

## 2015-10-08 DIAGNOSIS — I5030 Unspecified diastolic (congestive) heart failure: Secondary | ICD-10-CM | POA: Diagnosis not present

## 2015-10-08 DIAGNOSIS — E1122 Type 2 diabetes mellitus with diabetic chronic kidney disease: Secondary | ICD-10-CM | POA: Diagnosis not present

## 2015-10-08 DIAGNOSIS — E11649 Type 2 diabetes mellitus with hypoglycemia without coma: Secondary | ICD-10-CM | POA: Diagnosis not present

## 2015-10-08 DIAGNOSIS — R2689 Other abnormalities of gait and mobility: Secondary | ICD-10-CM | POA: Diagnosis not present

## 2015-10-08 DIAGNOSIS — I130 Hypertensive heart and chronic kidney disease with heart failure and stage 1 through stage 4 chronic kidney disease, or unspecified chronic kidney disease: Secondary | ICD-10-CM | POA: Diagnosis not present

## 2015-10-09 ENCOUNTER — Encounter (HOSPITAL_COMMUNITY)
Admission: RE | Admit: 2015-10-09 | Discharge: 2015-10-09 | Disposition: A | Discharge status: Home / Self Care | Payer: Medicare Other | Source: Ambulatory Visit | Attending: Nephrology | Admitting: Nephrology

## 2015-10-09 DIAGNOSIS — I5030 Unspecified diastolic (congestive) heart failure: Secondary | ICD-10-CM | POA: Diagnosis not present

## 2015-10-09 DIAGNOSIS — E11649 Type 2 diabetes mellitus with hypoglycemia without coma: Secondary | ICD-10-CM | POA: Diagnosis not present

## 2015-10-09 DIAGNOSIS — E1122 Type 2 diabetes mellitus with diabetic chronic kidney disease: Secondary | ICD-10-CM | POA: Diagnosis not present

## 2015-10-09 DIAGNOSIS — D638 Anemia in other chronic diseases classified elsewhere: Secondary | ICD-10-CM | POA: Diagnosis not present

## 2015-10-09 DIAGNOSIS — I130 Hypertensive heart and chronic kidney disease with heart failure and stage 1 through stage 4 chronic kidney disease, or unspecified chronic kidney disease: Secondary | ICD-10-CM | POA: Diagnosis not present

## 2015-10-09 DIAGNOSIS — K604 Rectal fistula: Secondary | ICD-10-CM | POA: Diagnosis not present

## 2015-10-09 DIAGNOSIS — N183 Chronic kidney disease, stage 3 (moderate): Secondary | ICD-10-CM | POA: Diagnosis not present

## 2015-10-09 DIAGNOSIS — R2689 Other abnormalities of gait and mobility: Secondary | ICD-10-CM | POA: Diagnosis not present

## 2015-10-09 LAB — HEMOGLOBIN AND HEMATOCRIT, BLOOD
HCT: 38.7 % (ref 36.0–46.0)
HEMOGLOBIN: 12.5 g/dL (ref 12.0–15.0)

## 2015-10-09 NOTE — Progress Notes (Signed)
Results for ERIELLE, SPROWL (MRN [id number]) as of 10/09/2015 11:15  Ref. Range 10/09/2015 09:00  Hemoglobin Latest Ref Range: 12.0-15.0 g/dL 12.5  HCT Latest Ref Range: 36.0-46.0 % 38.7   Aranesp not given per MD parameters.

## 2015-10-10 DIAGNOSIS — E1122 Type 2 diabetes mellitus with diabetic chronic kidney disease: Secondary | ICD-10-CM | POA: Diagnosis not present

## 2015-10-10 DIAGNOSIS — R2689 Other abnormalities of gait and mobility: Secondary | ICD-10-CM | POA: Diagnosis not present

## 2015-10-10 DIAGNOSIS — I5030 Unspecified diastolic (congestive) heart failure: Secondary | ICD-10-CM | POA: Diagnosis not present

## 2015-10-10 DIAGNOSIS — K604 Rectal fistula: Secondary | ICD-10-CM | POA: Diagnosis not present

## 2015-10-10 DIAGNOSIS — E11649 Type 2 diabetes mellitus with hypoglycemia without coma: Secondary | ICD-10-CM | POA: Diagnosis not present

## 2015-10-10 DIAGNOSIS — I130 Hypertensive heart and chronic kidney disease with heart failure and stage 1 through stage 4 chronic kidney disease, or unspecified chronic kidney disease: Secondary | ICD-10-CM | POA: Diagnosis not present

## 2015-10-13 DIAGNOSIS — E113553 Type 2 diabetes mellitus with stable proliferative diabetic retinopathy, bilateral: Secondary | ICD-10-CM | POA: Diagnosis not present

## 2015-10-13 DIAGNOSIS — H3562 Retinal hemorrhage, left eye: Secondary | ICD-10-CM | POA: Diagnosis not present

## 2015-10-13 DIAGNOSIS — H35352 Cystoid macular degeneration, left eye: Secondary | ICD-10-CM | POA: Diagnosis not present

## 2015-10-13 DIAGNOSIS — H353132 Nonexudative age-related macular degeneration, bilateral, intermediate dry stage: Secondary | ICD-10-CM | POA: Diagnosis not present

## 2015-10-13 LAB — HM DIABETES EYE EXAM

## 2015-10-14 DIAGNOSIS — E1122 Type 2 diabetes mellitus with diabetic chronic kidney disease: Secondary | ICD-10-CM | POA: Diagnosis not present

## 2015-10-14 DIAGNOSIS — R2689 Other abnormalities of gait and mobility: Secondary | ICD-10-CM | POA: Diagnosis not present

## 2015-10-14 DIAGNOSIS — K604 Rectal fistula: Secondary | ICD-10-CM | POA: Diagnosis not present

## 2015-10-14 DIAGNOSIS — E11649 Type 2 diabetes mellitus with hypoglycemia without coma: Secondary | ICD-10-CM | POA: Diagnosis not present

## 2015-10-14 DIAGNOSIS — I130 Hypertensive heart and chronic kidney disease with heart failure and stage 1 through stage 4 chronic kidney disease, or unspecified chronic kidney disease: Secondary | ICD-10-CM | POA: Diagnosis not present

## 2015-10-14 DIAGNOSIS — I5030 Unspecified diastolic (congestive) heart failure: Secondary | ICD-10-CM | POA: Diagnosis not present

## 2015-10-16 ENCOUNTER — Other Ambulatory Visit (HOSPITAL_COMMUNITY): Payer: Medicare Other

## 2015-10-16 DIAGNOSIS — K604 Rectal fistula: Secondary | ICD-10-CM | POA: Diagnosis not present

## 2015-10-16 DIAGNOSIS — S31809S Unspecified open wound of unspecified buttock, sequela: Secondary | ICD-10-CM | POA: Diagnosis not present

## 2015-10-16 DIAGNOSIS — L89622 Pressure ulcer of left heel, stage 2: Secondary | ICD-10-CM | POA: Diagnosis not present

## 2015-10-16 DIAGNOSIS — E1121 Type 2 diabetes mellitus with diabetic nephropathy: Secondary | ICD-10-CM | POA: Diagnosis not present

## 2015-10-16 DIAGNOSIS — T8131XA Disruption of external operation (surgical) wound, not elsewhere classified, initial encounter: Secondary | ICD-10-CM | POA: Diagnosis not present

## 2015-10-17 DIAGNOSIS — E11649 Type 2 diabetes mellitus with hypoglycemia without coma: Secondary | ICD-10-CM | POA: Diagnosis not present

## 2015-10-17 DIAGNOSIS — K604 Rectal fistula: Secondary | ICD-10-CM | POA: Diagnosis not present

## 2015-10-17 DIAGNOSIS — E1122 Type 2 diabetes mellitus with diabetic chronic kidney disease: Secondary | ICD-10-CM | POA: Diagnosis not present

## 2015-10-17 DIAGNOSIS — I130 Hypertensive heart and chronic kidney disease with heart failure and stage 1 through stage 4 chronic kidney disease, or unspecified chronic kidney disease: Secondary | ICD-10-CM | POA: Diagnosis not present

## 2015-10-17 DIAGNOSIS — I5030 Unspecified diastolic (congestive) heart failure: Secondary | ICD-10-CM | POA: Diagnosis not present

## 2015-10-17 DIAGNOSIS — R2689 Other abnormalities of gait and mobility: Secondary | ICD-10-CM | POA: Diagnosis not present

## 2015-10-21 DIAGNOSIS — R2689 Other abnormalities of gait and mobility: Secondary | ICD-10-CM | POA: Diagnosis not present

## 2015-10-21 DIAGNOSIS — I130 Hypertensive heart and chronic kidney disease with heart failure and stage 1 through stage 4 chronic kidney disease, or unspecified chronic kidney disease: Secondary | ICD-10-CM | POA: Diagnosis not present

## 2015-10-21 DIAGNOSIS — K604 Rectal fistula: Secondary | ICD-10-CM | POA: Diagnosis not present

## 2015-10-21 DIAGNOSIS — E11649 Type 2 diabetes mellitus with hypoglycemia without coma: Secondary | ICD-10-CM | POA: Diagnosis not present

## 2015-10-21 DIAGNOSIS — E1122 Type 2 diabetes mellitus with diabetic chronic kidney disease: Secondary | ICD-10-CM | POA: Diagnosis not present

## 2015-10-21 DIAGNOSIS — I5030 Unspecified diastolic (congestive) heart failure: Secondary | ICD-10-CM | POA: Diagnosis not present

## 2015-10-22 DIAGNOSIS — I130 Hypertensive heart and chronic kidney disease with heart failure and stage 1 through stage 4 chronic kidney disease, or unspecified chronic kidney disease: Secondary | ICD-10-CM | POA: Diagnosis not present

## 2015-10-22 DIAGNOSIS — R2689 Other abnormalities of gait and mobility: Secondary | ICD-10-CM | POA: Diagnosis not present

## 2015-10-22 DIAGNOSIS — I5030 Unspecified diastolic (congestive) heart failure: Secondary | ICD-10-CM | POA: Diagnosis not present

## 2015-10-22 DIAGNOSIS — E11649 Type 2 diabetes mellitus with hypoglycemia without coma: Secondary | ICD-10-CM | POA: Diagnosis not present

## 2015-10-22 DIAGNOSIS — K604 Rectal fistula: Secondary | ICD-10-CM | POA: Diagnosis not present

## 2015-10-22 DIAGNOSIS — E1122 Type 2 diabetes mellitus with diabetic chronic kidney disease: Secondary | ICD-10-CM | POA: Diagnosis not present

## 2015-10-23 ENCOUNTER — Other Ambulatory Visit (HOSPITAL_COMMUNITY): Payer: Medicare Other

## 2015-10-23 DIAGNOSIS — R2689 Other abnormalities of gait and mobility: Secondary | ICD-10-CM | POA: Diagnosis not present

## 2015-10-23 DIAGNOSIS — E11649 Type 2 diabetes mellitus with hypoglycemia without coma: Secondary | ICD-10-CM | POA: Diagnosis not present

## 2015-10-23 DIAGNOSIS — I5030 Unspecified diastolic (congestive) heart failure: Secondary | ICD-10-CM | POA: Diagnosis not present

## 2015-10-23 DIAGNOSIS — K604 Rectal fistula: Secondary | ICD-10-CM | POA: Diagnosis not present

## 2015-10-23 DIAGNOSIS — I130 Hypertensive heart and chronic kidney disease with heart failure and stage 1 through stage 4 chronic kidney disease, or unspecified chronic kidney disease: Secondary | ICD-10-CM | POA: Diagnosis not present

## 2015-10-23 DIAGNOSIS — E1122 Type 2 diabetes mellitus with diabetic chronic kidney disease: Secondary | ICD-10-CM | POA: Diagnosis not present

## 2015-10-24 DIAGNOSIS — R2689 Other abnormalities of gait and mobility: Secondary | ICD-10-CM | POA: Diagnosis not present

## 2015-10-24 DIAGNOSIS — I130 Hypertensive heart and chronic kidney disease with heart failure and stage 1 through stage 4 chronic kidney disease, or unspecified chronic kidney disease: Secondary | ICD-10-CM | POA: Diagnosis not present

## 2015-10-24 DIAGNOSIS — E1122 Type 2 diabetes mellitus with diabetic chronic kidney disease: Secondary | ICD-10-CM | POA: Diagnosis not present

## 2015-10-24 DIAGNOSIS — I5030 Unspecified diastolic (congestive) heart failure: Secondary | ICD-10-CM | POA: Diagnosis not present

## 2015-10-24 DIAGNOSIS — E11649 Type 2 diabetes mellitus with hypoglycemia without coma: Secondary | ICD-10-CM | POA: Diagnosis not present

## 2015-10-24 DIAGNOSIS — K604 Rectal fistula: Secondary | ICD-10-CM | POA: Diagnosis not present

## 2015-10-27 DIAGNOSIS — E11649 Type 2 diabetes mellitus with hypoglycemia without coma: Secondary | ICD-10-CM | POA: Diagnosis not present

## 2015-10-27 DIAGNOSIS — R2689 Other abnormalities of gait and mobility: Secondary | ICD-10-CM | POA: Diagnosis not present

## 2015-10-27 DIAGNOSIS — K604 Rectal fistula: Secondary | ICD-10-CM | POA: Diagnosis not present

## 2015-10-27 DIAGNOSIS — I130 Hypertensive heart and chronic kidney disease with heart failure and stage 1 through stage 4 chronic kidney disease, or unspecified chronic kidney disease: Secondary | ICD-10-CM | POA: Diagnosis not present

## 2015-10-27 DIAGNOSIS — I5030 Unspecified diastolic (congestive) heart failure: Secondary | ICD-10-CM | POA: Diagnosis not present

## 2015-10-27 DIAGNOSIS — E1122 Type 2 diabetes mellitus with diabetic chronic kidney disease: Secondary | ICD-10-CM | POA: Diagnosis not present

## 2015-10-29 DIAGNOSIS — I5030 Unspecified diastolic (congestive) heart failure: Secondary | ICD-10-CM | POA: Diagnosis not present

## 2015-10-29 DIAGNOSIS — K604 Rectal fistula: Secondary | ICD-10-CM | POA: Diagnosis not present

## 2015-10-29 DIAGNOSIS — E11649 Type 2 diabetes mellitus with hypoglycemia without coma: Secondary | ICD-10-CM | POA: Diagnosis not present

## 2015-10-29 DIAGNOSIS — E1122 Type 2 diabetes mellitus with diabetic chronic kidney disease: Secondary | ICD-10-CM | POA: Diagnosis not present

## 2015-10-29 DIAGNOSIS — I130 Hypertensive heart and chronic kidney disease with heart failure and stage 1 through stage 4 chronic kidney disease, or unspecified chronic kidney disease: Secondary | ICD-10-CM | POA: Diagnosis not present

## 2015-10-29 DIAGNOSIS — R2689 Other abnormalities of gait and mobility: Secondary | ICD-10-CM | POA: Diagnosis not present

## 2015-10-30 ENCOUNTER — Encounter (HOSPITAL_COMMUNITY)
Admission: RE | Admit: 2015-10-30 | Discharge: 2015-10-30 | Disposition: A | Discharge status: Home / Self Care | Payer: Medicare Other | Source: Ambulatory Visit | Attending: Nephrology | Admitting: Nephrology

## 2015-10-30 DIAGNOSIS — N183 Chronic kidney disease, stage 3 (moderate): Secondary | ICD-10-CM | POA: Diagnosis not present

## 2015-10-30 DIAGNOSIS — D638 Anemia in other chronic diseases classified elsewhere: Secondary | ICD-10-CM | POA: Diagnosis not present

## 2015-10-30 LAB — HEMOGLOBIN AND HEMATOCRIT, BLOOD
HCT: 35.1 % — ABNORMAL LOW (ref 36.0–46.0)
HEMOGLOBIN: 11.4 g/dL — AB (ref 12.0–15.0)

## 2015-10-30 MED ORDER — DARBEPOETIN ALFA 100 MCG/0.5ML IJ SOSY
PREFILLED_SYRINGE | INTRAMUSCULAR | Status: AC
  Filled 2015-10-30: qty 0.5

## 2015-10-30 MED ORDER — DARBEPOETIN ALFA 100 MCG/0.5ML IJ SOSY
100.0000 ug | PREFILLED_SYRINGE | INTRAMUSCULAR | Status: DC
  Administered 2015-10-30: 100 ug via SUBCUTANEOUS

## 2015-10-30 NOTE — Progress Notes (Signed)
Results for [name], [name] (MRN [id number]) as of 10/30/2015 10:27  Ref. Range 10/30/2015 09:40  Hemoglobin Latest Ref Range: 12.0-15.0 g/dL 11.4 (L)  HCT Latest Ref Range: 36.0-46.0 % 35.1 (L)   Aranesp 100 mcg administered

## 2015-10-31 ENCOUNTER — Encounter (HOSPITAL_BASED_OUTPATIENT_CLINIC_OR_DEPARTMENT_OTHER): Payer: Medicare Other | Attending: Internal Medicine

## 2015-10-31 DIAGNOSIS — E1121 Type 2 diabetes mellitus with diabetic nephropathy: Secondary | ICD-10-CM | POA: Insufficient documentation

## 2015-10-31 DIAGNOSIS — E11649 Type 2 diabetes mellitus with hypoglycemia without coma: Secondary | ICD-10-CM | POA: Diagnosis not present

## 2015-10-31 DIAGNOSIS — I5030 Unspecified diastolic (congestive) heart failure: Secondary | ICD-10-CM | POA: Diagnosis not present

## 2015-10-31 DIAGNOSIS — R2689 Other abnormalities of gait and mobility: Secondary | ICD-10-CM | POA: Diagnosis not present

## 2015-10-31 DIAGNOSIS — T8131XA Disruption of external operation (surgical) wound, not elsewhere classified, initial encounter: Secondary | ICD-10-CM | POA: Insufficient documentation

## 2015-10-31 DIAGNOSIS — I130 Hypertensive heart and chronic kidney disease with heart failure and stage 1 through stage 4 chronic kidney disease, or unspecified chronic kidney disease: Secondary | ICD-10-CM | POA: Diagnosis not present

## 2015-10-31 DIAGNOSIS — Y838 Other surgical procedures as the cause of abnormal reaction of the patient, or of later complication, without mention of misadventure at the time of the procedure: Secondary | ICD-10-CM | POA: Diagnosis not present

## 2015-10-31 DIAGNOSIS — K604 Rectal fistula: Secondary | ICD-10-CM | POA: Insufficient documentation

## 2015-10-31 DIAGNOSIS — E1122 Type 2 diabetes mellitus with diabetic chronic kidney disease: Secondary | ICD-10-CM | POA: Diagnosis not present

## 2015-11-03 DIAGNOSIS — E11649 Type 2 diabetes mellitus with hypoglycemia without coma: Secondary | ICD-10-CM | POA: Diagnosis not present

## 2015-11-03 DIAGNOSIS — I5030 Unspecified diastolic (congestive) heart failure: Secondary | ICD-10-CM | POA: Diagnosis not present

## 2015-11-03 DIAGNOSIS — I130 Hypertensive heart and chronic kidney disease with heart failure and stage 1 through stage 4 chronic kidney disease, or unspecified chronic kidney disease: Secondary | ICD-10-CM | POA: Diagnosis not present

## 2015-11-03 DIAGNOSIS — K604 Rectal fistula: Secondary | ICD-10-CM | POA: Diagnosis not present

## 2015-11-03 DIAGNOSIS — R2689 Other abnormalities of gait and mobility: Secondary | ICD-10-CM | POA: Diagnosis not present

## 2015-11-03 DIAGNOSIS — E1122 Type 2 diabetes mellitus with diabetic chronic kidney disease: Secondary | ICD-10-CM | POA: Diagnosis not present

## 2015-11-05 DIAGNOSIS — I5030 Unspecified diastolic (congestive) heart failure: Secondary | ICD-10-CM | POA: Diagnosis not present

## 2015-11-05 DIAGNOSIS — E1122 Type 2 diabetes mellitus with diabetic chronic kidney disease: Secondary | ICD-10-CM | POA: Diagnosis not present

## 2015-11-05 DIAGNOSIS — R2689 Other abnormalities of gait and mobility: Secondary | ICD-10-CM | POA: Diagnosis not present

## 2015-11-05 DIAGNOSIS — I130 Hypertensive heart and chronic kidney disease with heart failure and stage 1 through stage 4 chronic kidney disease, or unspecified chronic kidney disease: Secondary | ICD-10-CM | POA: Diagnosis not present

## 2015-11-05 DIAGNOSIS — K604 Rectal fistula: Secondary | ICD-10-CM | POA: Diagnosis not present

## 2015-11-05 DIAGNOSIS — E11649 Type 2 diabetes mellitus with hypoglycemia without coma: Secondary | ICD-10-CM | POA: Diagnosis not present

## 2015-11-07 DIAGNOSIS — N184 Chronic kidney disease, stage 4 (severe): Secondary | ICD-10-CM | POA: Diagnosis not present

## 2015-11-07 DIAGNOSIS — K604 Rectal fistula: Secondary | ICD-10-CM | POA: Diagnosis not present

## 2015-11-07 DIAGNOSIS — I5030 Unspecified diastolic (congestive) heart failure: Secondary | ICD-10-CM | POA: Diagnosis not present

## 2015-11-07 DIAGNOSIS — E11649 Type 2 diabetes mellitus with hypoglycemia without coma: Secondary | ICD-10-CM | POA: Diagnosis not present

## 2015-11-07 DIAGNOSIS — I130 Hypertensive heart and chronic kidney disease with heart failure and stage 1 through stage 4 chronic kidney disease, or unspecified chronic kidney disease: Secondary | ICD-10-CM | POA: Diagnosis not present

## 2015-11-07 DIAGNOSIS — E1122 Type 2 diabetes mellitus with diabetic chronic kidney disease: Secondary | ICD-10-CM | POA: Diagnosis not present

## 2015-11-10 DIAGNOSIS — E1122 Type 2 diabetes mellitus with diabetic chronic kidney disease: Secondary | ICD-10-CM | POA: Diagnosis not present

## 2015-11-10 DIAGNOSIS — K604 Rectal fistula: Secondary | ICD-10-CM | POA: Diagnosis not present

## 2015-11-10 DIAGNOSIS — I130 Hypertensive heart and chronic kidney disease with heart failure and stage 1 through stage 4 chronic kidney disease, or unspecified chronic kidney disease: Secondary | ICD-10-CM | POA: Diagnosis not present

## 2015-11-10 DIAGNOSIS — N184 Chronic kidney disease, stage 4 (severe): Secondary | ICD-10-CM | POA: Diagnosis not present

## 2015-11-10 DIAGNOSIS — I5030 Unspecified diastolic (congestive) heart failure: Secondary | ICD-10-CM | POA: Diagnosis not present

## 2015-11-10 DIAGNOSIS — E11649 Type 2 diabetes mellitus with hypoglycemia without coma: Secondary | ICD-10-CM | POA: Diagnosis not present

## 2015-11-11 ENCOUNTER — Ambulatory Visit: Payer: Medicare Other | Admitting: Orthopedic Surgery

## 2015-11-12 DIAGNOSIS — E1122 Type 2 diabetes mellitus with diabetic chronic kidney disease: Secondary | ICD-10-CM | POA: Diagnosis not present

## 2015-11-12 DIAGNOSIS — N184 Chronic kidney disease, stage 4 (severe): Secondary | ICD-10-CM | POA: Diagnosis not present

## 2015-11-12 DIAGNOSIS — I130 Hypertensive heart and chronic kidney disease with heart failure and stage 1 through stage 4 chronic kidney disease, or unspecified chronic kidney disease: Secondary | ICD-10-CM | POA: Diagnosis not present

## 2015-11-12 DIAGNOSIS — I5030 Unspecified diastolic (congestive) heart failure: Secondary | ICD-10-CM | POA: Diagnosis not present

## 2015-11-12 DIAGNOSIS — E11649 Type 2 diabetes mellitus with hypoglycemia without coma: Secondary | ICD-10-CM | POA: Diagnosis not present

## 2015-11-12 DIAGNOSIS — K604 Rectal fistula: Secondary | ICD-10-CM | POA: Diagnosis not present

## 2015-11-13 DIAGNOSIS — E1121 Type 2 diabetes mellitus with diabetic nephropathy: Secondary | ICD-10-CM | POA: Diagnosis not present

## 2015-11-13 DIAGNOSIS — T8131XA Disruption of external operation (surgical) wound, not elsewhere classified, initial encounter: Secondary | ICD-10-CM | POA: Diagnosis not present

## 2015-11-13 DIAGNOSIS — K604 Rectal fistula: Secondary | ICD-10-CM | POA: Diagnosis not present

## 2015-11-14 DIAGNOSIS — N184 Chronic kidney disease, stage 4 (severe): Secondary | ICD-10-CM | POA: Diagnosis not present

## 2015-11-14 DIAGNOSIS — I5030 Unspecified diastolic (congestive) heart failure: Secondary | ICD-10-CM | POA: Diagnosis not present

## 2015-11-14 DIAGNOSIS — I130 Hypertensive heart and chronic kidney disease with heart failure and stage 1 through stage 4 chronic kidney disease, or unspecified chronic kidney disease: Secondary | ICD-10-CM | POA: Diagnosis not present

## 2015-11-14 DIAGNOSIS — E11649 Type 2 diabetes mellitus with hypoglycemia without coma: Secondary | ICD-10-CM | POA: Diagnosis not present

## 2015-11-14 DIAGNOSIS — K604 Rectal fistula: Secondary | ICD-10-CM | POA: Diagnosis not present

## 2015-11-14 DIAGNOSIS — E1122 Type 2 diabetes mellitus with diabetic chronic kidney disease: Secondary | ICD-10-CM | POA: Diagnosis not present

## 2015-11-17 ENCOUNTER — Ambulatory Visit: Payer: Medicare Other | Admitting: Orthopedic Surgery

## 2015-11-17 DIAGNOSIS — E1122 Type 2 diabetes mellitus with diabetic chronic kidney disease: Secondary | ICD-10-CM | POA: Diagnosis not present

## 2015-11-17 DIAGNOSIS — N184 Chronic kidney disease, stage 4 (severe): Secondary | ICD-10-CM | POA: Diagnosis not present

## 2015-11-17 DIAGNOSIS — E11649 Type 2 diabetes mellitus with hypoglycemia without coma: Secondary | ICD-10-CM | POA: Diagnosis not present

## 2015-11-17 DIAGNOSIS — I130 Hypertensive heart and chronic kidney disease with heart failure and stage 1 through stage 4 chronic kidney disease, or unspecified chronic kidney disease: Secondary | ICD-10-CM | POA: Diagnosis not present

## 2015-11-17 DIAGNOSIS — K604 Rectal fistula: Secondary | ICD-10-CM | POA: Diagnosis not present

## 2015-11-17 DIAGNOSIS — I5030 Unspecified diastolic (congestive) heart failure: Secondary | ICD-10-CM | POA: Diagnosis not present

## 2015-11-19 DIAGNOSIS — E11649 Type 2 diabetes mellitus with hypoglycemia without coma: Secondary | ICD-10-CM | POA: Diagnosis not present

## 2015-11-19 DIAGNOSIS — E1122 Type 2 diabetes mellitus with diabetic chronic kidney disease: Secondary | ICD-10-CM | POA: Diagnosis not present

## 2015-11-19 DIAGNOSIS — I130 Hypertensive heart and chronic kidney disease with heart failure and stage 1 through stage 4 chronic kidney disease, or unspecified chronic kidney disease: Secondary | ICD-10-CM | POA: Diagnosis not present

## 2015-11-19 DIAGNOSIS — I5030 Unspecified diastolic (congestive) heart failure: Secondary | ICD-10-CM | POA: Diagnosis not present

## 2015-11-19 DIAGNOSIS — K604 Rectal fistula: Secondary | ICD-10-CM | POA: Diagnosis not present

## 2015-11-19 DIAGNOSIS — N184 Chronic kidney disease, stage 4 (severe): Secondary | ICD-10-CM | POA: Diagnosis not present

## 2015-11-20 DIAGNOSIS — I130 Hypertensive heart and chronic kidney disease with heart failure and stage 1 through stage 4 chronic kidney disease, or unspecified chronic kidney disease: Secondary | ICD-10-CM | POA: Diagnosis not present

## 2015-11-20 DIAGNOSIS — E11649 Type 2 diabetes mellitus with hypoglycemia without coma: Secondary | ICD-10-CM | POA: Diagnosis not present

## 2015-11-20 DIAGNOSIS — K604 Rectal fistula: Secondary | ICD-10-CM | POA: Diagnosis not present

## 2015-11-20 DIAGNOSIS — I5030 Unspecified diastolic (congestive) heart failure: Secondary | ICD-10-CM | POA: Diagnosis not present

## 2015-11-20 DIAGNOSIS — E1122 Type 2 diabetes mellitus with diabetic chronic kidney disease: Secondary | ICD-10-CM | POA: Diagnosis not present

## 2015-11-20 DIAGNOSIS — N184 Chronic kidney disease, stage 4 (severe): Secondary | ICD-10-CM | POA: Diagnosis not present

## 2015-11-21 ENCOUNTER — Other Ambulatory Visit: Payer: Medicare Other

## 2015-11-21 ENCOUNTER — Ambulatory Visit: Payer: Medicare Other | Admitting: "Endocrinology

## 2015-11-21 ENCOUNTER — Other Ambulatory Visit: Payer: Self-pay | Admitting: "Endocrinology

## 2015-11-21 DIAGNOSIS — E1165 Type 2 diabetes mellitus with hyperglycemia: Secondary | ICD-10-CM

## 2015-11-21 DIAGNOSIS — IMO0002 Reserved for concepts with insufficient information to code with codable children: Secondary | ICD-10-CM

## 2015-11-21 DIAGNOSIS — E039 Hypothyroidism, unspecified: Secondary | ICD-10-CM

## 2015-11-21 DIAGNOSIS — E1169 Type 2 diabetes mellitus with other specified complication: Secondary | ICD-10-CM

## 2015-11-22 LAB — COMPREHENSIVE METABOLIC PANEL
ALBUMIN: 3.9 g/dL (ref 3.5–4.8)
ALT: 4 IU/L (ref 0–32)
AST: 12 IU/L (ref 0–40)
Albumin/Globulin Ratio: 1.3 (ref 1.2–2.2)
Alkaline Phosphatase: 143 IU/L — ABNORMAL HIGH (ref 39–117)
BUN / CREAT RATIO: 20 (ref 12–28)
BUN: 33 mg/dL — ABNORMAL HIGH (ref 8–27)
Bilirubin Total: 0.3 mg/dL (ref 0.0–1.2)
CALCIUM: 9.1 mg/dL (ref 8.7–10.3)
CO2: 21 mmol/L (ref 18–29)
CREATININE: 1.63 mg/dL — AB (ref 0.57–1.00)
Chloride: 102 mmol/L (ref 96–106)
GFR, EST AFRICAN AMERICAN: 37 mL/min/{1.73_m2} — AB (ref >59)
GFR, EST NON AFRICAN AMERICAN: 32 mL/min/{1.73_m2} — AB (ref >59)
GLOBULIN, TOTAL: 2.9 g/dL (ref 1.5–4.5)
Glucose: 167 mg/dL — ABNORMAL HIGH (ref 65–99)
POTASSIUM: 5.6 mmol/L — AB (ref 3.5–5.2)
SODIUM: 141 mmol/L (ref 134–144)
TOTAL PROTEIN: 6.8 g/dL (ref 6.0–8.5)

## 2015-11-22 LAB — HGB A1C W/O EAG: Hgb A1c MFr Bld: 7.3 % — ABNORMAL HIGH (ref 4.8–5.6)

## 2015-11-22 LAB — TSH: TSH: 2.81 u[IU]/mL (ref 0.450–4.500)

## 2015-11-22 LAB — T4, FREE: Free T4: 1.17 ng/dL (ref 0.82–1.77)

## 2015-11-26 DIAGNOSIS — E1122 Type 2 diabetes mellitus with diabetic chronic kidney disease: Secondary | ICD-10-CM | POA: Diagnosis not present

## 2015-11-26 DIAGNOSIS — N184 Chronic kidney disease, stage 4 (severe): Secondary | ICD-10-CM | POA: Diagnosis not present

## 2015-11-26 DIAGNOSIS — I5030 Unspecified diastolic (congestive) heart failure: Secondary | ICD-10-CM | POA: Diagnosis not present

## 2015-11-26 DIAGNOSIS — K604 Rectal fistula: Secondary | ICD-10-CM | POA: Diagnosis not present

## 2015-11-26 DIAGNOSIS — E11649 Type 2 diabetes mellitus with hypoglycemia without coma: Secondary | ICD-10-CM | POA: Diagnosis not present

## 2015-11-26 DIAGNOSIS — I130 Hypertensive heart and chronic kidney disease with heart failure and stage 1 through stage 4 chronic kidney disease, or unspecified chronic kidney disease: Secondary | ICD-10-CM | POA: Diagnosis not present

## 2015-11-27 ENCOUNTER — Telehealth: Payer: Self-pay | Admitting: Pediatrics

## 2015-11-27 DIAGNOSIS — K604 Rectal fistula: Secondary | ICD-10-CM | POA: Diagnosis not present

## 2015-11-27 DIAGNOSIS — I130 Hypertensive heart and chronic kidney disease with heart failure and stage 1 through stage 4 chronic kidney disease, or unspecified chronic kidney disease: Secondary | ICD-10-CM | POA: Diagnosis not present

## 2015-11-27 DIAGNOSIS — N184 Chronic kidney disease, stage 4 (severe): Secondary | ICD-10-CM | POA: Diagnosis not present

## 2015-11-27 DIAGNOSIS — E11649 Type 2 diabetes mellitus with hypoglycemia without coma: Secondary | ICD-10-CM | POA: Diagnosis not present

## 2015-11-27 DIAGNOSIS — E1122 Type 2 diabetes mellitus with diabetic chronic kidney disease: Secondary | ICD-10-CM | POA: Diagnosis not present

## 2015-11-27 DIAGNOSIS — I5030 Unspecified diastolic (congestive) heart failure: Secondary | ICD-10-CM | POA: Diagnosis not present

## 2015-11-27 NOTE — Telephone Encounter (Signed)
Spoke to pt and she already has what she needs.

## 2015-11-28 DIAGNOSIS — K604 Rectal fistula: Secondary | ICD-10-CM | POA: Diagnosis not present

## 2015-11-28 DIAGNOSIS — I130 Hypertensive heart and chronic kidney disease with heart failure and stage 1 through stage 4 chronic kidney disease, or unspecified chronic kidney disease: Secondary | ICD-10-CM | POA: Diagnosis not present

## 2015-11-28 DIAGNOSIS — I5030 Unspecified diastolic (congestive) heart failure: Secondary | ICD-10-CM | POA: Diagnosis not present

## 2015-11-28 DIAGNOSIS — E1122 Type 2 diabetes mellitus with diabetic chronic kidney disease: Secondary | ICD-10-CM | POA: Diagnosis not present

## 2015-11-28 DIAGNOSIS — E11649 Type 2 diabetes mellitus with hypoglycemia without coma: Secondary | ICD-10-CM | POA: Diagnosis not present

## 2015-11-28 DIAGNOSIS — N184 Chronic kidney disease, stage 4 (severe): Secondary | ICD-10-CM | POA: Diagnosis not present

## 2015-12-01 DIAGNOSIS — E11649 Type 2 diabetes mellitus with hypoglycemia without coma: Secondary | ICD-10-CM | POA: Diagnosis not present

## 2015-12-01 DIAGNOSIS — I5030 Unspecified diastolic (congestive) heart failure: Secondary | ICD-10-CM | POA: Diagnosis not present

## 2015-12-01 DIAGNOSIS — N184 Chronic kidney disease, stage 4 (severe): Secondary | ICD-10-CM | POA: Diagnosis not present

## 2015-12-01 DIAGNOSIS — I130 Hypertensive heart and chronic kidney disease with heart failure and stage 1 through stage 4 chronic kidney disease, or unspecified chronic kidney disease: Secondary | ICD-10-CM | POA: Diagnosis not present

## 2015-12-01 DIAGNOSIS — E1122 Type 2 diabetes mellitus with diabetic chronic kidney disease: Secondary | ICD-10-CM | POA: Diagnosis not present

## 2015-12-01 DIAGNOSIS — K604 Rectal fistula: Secondary | ICD-10-CM | POA: Diagnosis not present

## 2015-12-03 DIAGNOSIS — E11649 Type 2 diabetes mellitus with hypoglycemia without coma: Secondary | ICD-10-CM | POA: Diagnosis not present

## 2015-12-03 DIAGNOSIS — I130 Hypertensive heart and chronic kidney disease with heart failure and stage 1 through stage 4 chronic kidney disease, or unspecified chronic kidney disease: Secondary | ICD-10-CM | POA: Diagnosis not present

## 2015-12-03 DIAGNOSIS — I5030 Unspecified diastolic (congestive) heart failure: Secondary | ICD-10-CM | POA: Diagnosis not present

## 2015-12-03 DIAGNOSIS — N184 Chronic kidney disease, stage 4 (severe): Secondary | ICD-10-CM | POA: Diagnosis not present

## 2015-12-03 DIAGNOSIS — K604 Rectal fistula: Secondary | ICD-10-CM | POA: Diagnosis not present

## 2015-12-03 DIAGNOSIS — E1122 Type 2 diabetes mellitus with diabetic chronic kidney disease: Secondary | ICD-10-CM | POA: Diagnosis not present

## 2015-12-04 ENCOUNTER — Inpatient Hospital Stay (HOSPITAL_COMMUNITY): Admission: RE | Admit: 2015-12-04 | Payer: Medicare Other | Source: Ambulatory Visit

## 2015-12-04 ENCOUNTER — Ambulatory Visit (HOSPITAL_COMMUNITY): Payer: Medicare Other

## 2015-12-04 ENCOUNTER — Encounter (HOSPITAL_BASED_OUTPATIENT_CLINIC_OR_DEPARTMENT_OTHER): Payer: Medicare Other | Attending: Internal Medicine

## 2015-12-04 DIAGNOSIS — T8131XA Disruption of external operation (surgical) wound, not elsewhere classified, initial encounter: Secondary | ICD-10-CM | POA: Insufficient documentation

## 2015-12-04 DIAGNOSIS — Y838 Other surgical procedures as the cause of abnormal reaction of the patient, or of later complication, without mention of misadventure at the time of the procedure: Secondary | ICD-10-CM | POA: Diagnosis not present

## 2015-12-05 ENCOUNTER — Ambulatory Visit (HOSPITAL_COMMUNITY)
Admission: RE | Admit: 2015-12-05 | Discharge: 2015-12-05 | Disposition: A | Discharge status: Home / Self Care | Payer: Medicare Other | Source: Ambulatory Visit | Attending: Nephrology | Admitting: Nephrology

## 2015-12-05 ENCOUNTER — Encounter (HOSPITAL_COMMUNITY)
Admission: RE | Admit: 2015-12-05 | Discharge: 2015-12-05 | Disposition: A | Discharge status: Home / Self Care | Payer: Medicare Other | Source: Ambulatory Visit | Attending: Nephrology | Admitting: Nephrology

## 2015-12-05 DIAGNOSIS — N184 Chronic kidney disease, stage 4 (severe): Secondary | ICD-10-CM | POA: Diagnosis not present

## 2015-12-05 DIAGNOSIS — I5030 Unspecified diastolic (congestive) heart failure: Secondary | ICD-10-CM | POA: Diagnosis not present

## 2015-12-05 DIAGNOSIS — N183 Chronic kidney disease, stage 3 (moderate): Secondary | ICD-10-CM | POA: Insufficient documentation

## 2015-12-05 DIAGNOSIS — I130 Hypertensive heart and chronic kidney disease with heart failure and stage 1 through stage 4 chronic kidney disease, or unspecified chronic kidney disease: Secondary | ICD-10-CM | POA: Diagnosis not present

## 2015-12-05 DIAGNOSIS — K604 Rectal fistula: Secondary | ICD-10-CM | POA: Diagnosis not present

## 2015-12-05 DIAGNOSIS — D638 Anemia in other chronic diseases classified elsewhere: Secondary | ICD-10-CM | POA: Insufficient documentation

## 2015-12-05 DIAGNOSIS — E11649 Type 2 diabetes mellitus with hypoglycemia without coma: Secondary | ICD-10-CM | POA: Diagnosis not present

## 2015-12-05 DIAGNOSIS — E1122 Type 2 diabetes mellitus with diabetic chronic kidney disease: Secondary | ICD-10-CM | POA: Diagnosis not present

## 2015-12-05 LAB — IRON AND TIBC
Iron: 65 ug/dL (ref 28–170)
SATURATION RATIOS: 28 % (ref 10.4–31.8)
TIBC: 231 ug/dL — AB (ref 250–450)
UIBC: 166 ug/dL

## 2015-12-05 LAB — HEMOGLOBIN AND HEMATOCRIT, BLOOD
HCT: 36.9 % (ref 36.0–46.0)
Hemoglobin: 12.1 g/dL (ref 12.0–15.0)

## 2015-12-05 LAB — FERRITIN: FERRITIN: 540 ng/mL — AB (ref 11–307)

## 2015-12-08 ENCOUNTER — Encounter: Payer: Self-pay | Admitting: "Endocrinology

## 2015-12-08 ENCOUNTER — Ambulatory Visit (INDEPENDENT_AMBULATORY_CARE_PROVIDER_SITE_OTHER): Payer: Medicare Other | Admitting: "Endocrinology

## 2015-12-08 VITALS — BP 109/69 | HR 73 | Ht 62.0 in | Wt 160.0 lb

## 2015-12-08 DIAGNOSIS — E11649 Type 2 diabetes mellitus with hypoglycemia without coma: Secondary | ICD-10-CM | POA: Diagnosis not present

## 2015-12-08 DIAGNOSIS — E1122 Type 2 diabetes mellitus with diabetic chronic kidney disease: Secondary | ICD-10-CM

## 2015-12-08 DIAGNOSIS — I10 Essential (primary) hypertension: Secondary | ICD-10-CM | POA: Diagnosis not present

## 2015-12-08 DIAGNOSIS — E039 Hypothyroidism, unspecified: Secondary | ICD-10-CM

## 2015-12-08 DIAGNOSIS — K604 Rectal fistula: Secondary | ICD-10-CM | POA: Diagnosis not present

## 2015-12-08 DIAGNOSIS — E785 Hyperlipidemia, unspecified: Secondary | ICD-10-CM | POA: Diagnosis not present

## 2015-12-08 DIAGNOSIS — N184 Chronic kidney disease, stage 4 (severe): Secondary | ICD-10-CM

## 2015-12-08 DIAGNOSIS — I5030 Unspecified diastolic (congestive) heart failure: Secondary | ICD-10-CM | POA: Diagnosis not present

## 2015-12-08 DIAGNOSIS — I130 Hypertensive heart and chronic kidney disease with heart failure and stage 1 through stage 4 chronic kidney disease, or unspecified chronic kidney disease: Secondary | ICD-10-CM | POA: Diagnosis not present

## 2015-12-08 NOTE — Progress Notes (Signed)
Subjective:    Patient ID: [name], female    DOB: [birth year]. Patient is to follow-up for uncontrolled type 2 diabetes with her meter and logs.  Past Medical History  Diagnosis Date  . Hypertension   . Hyperlipidemia   . GERD (gastroesophageal reflux disease)   . GAD (generalized anxiety disorder)   . Hypothyroidism   . Type II diabetes mellitus ([place])   . Right bundle branch block   . LAFB (left anterior fascicular block)   . History of rectal abscess     12-29-2004  bedside I & D  . History of GI bleed     upper 2009  due to esophagitis  &  2002  due to Mallory-Weiss tear  . History of colon polyps     benign  . Diverticulosis of colon   . History of esophagitis   . Diabetic gastroparesis ([place])   . History of gout     in issues for several years  . Sacral decubitus ulcer     since 2014- 01-02-15 remains with wound" gauze dressing changes daily.  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 Coronary artery disease   . CKD (chronic kidney disease), stage III     nephrologist--  dr Mercy Moore  . Diabetic retinopathy ([place])     bilateral --  monitored by dr [name]  . Arthritis     knees and hand/fingers. "broke back"-being evaluated for this"weakness left leg"  . Anemia in chronic renal disease     Aranesp injection --  when Hg <11, last injection 12-24-14.  Marland Kitchen Anxiety   . Constipation   . Complication of anesthesia     post-op confusion    Past Surgical History  Procedure Laterality Date  . Retinopathy surgery Bilateral 1980's?  . Orif femur fracture Left 10/09/2012    Procedure: OPEN REDUCTION INTERNAL FIXATION (ORIF) DISTAL FEMUR FRACTURE;  Surgeon: Rozanna Box, MD;  Location: [facility];  Service: Orthopedics;  Laterality: Left;  . Compression hip screw Right 05/18/2014    Procedure: COMPRESSION HIP;  Surgeon: Carole Civil, MD;  Location: [facility];  Service: Orthopedics;  Laterality: Right;  . Esophagogastroduodenoscopy  last one  01-09-2011  . Transthoracic echocardiogram  02-18-2011    mild LVH,  ef 55-60%,  grade I diastolic dysfunction/  mild TR/  RV systolic pressure increased consistant with moderate pulmonary hypertension  . Cardiovascular stress test  12-30-2004    normal perfusion study/  no ischemia or infartion/  normal LV wall function and wall motion , ef 66%  . Colonoscopy w/ polypectomy  last one 2008  . Cataract extraction w/ intraocular lens  implant, bilateral  1995  . Incision and drainage of wound N/A 09/30/2014    Procedure: IRRIGATION AND DEBRIDEMENT SACRAL WOUND, EXCISION OF PERIRECTAL TRACT WITH PLACEMENT OF ACCELL;  Surgeon: Theodoro Kos, DO;  Location: [facility];  Service: Plastics;  Laterality: N/A;  . Evaluation under anesthesia with fistulectomy N/A 01/06/2015    Procedure: EXAM UNDER ANESTHESIA , placement of seton;  Surgeon: Jackolyn Confer, MD;  Location: [facility];  Service: General;  Laterality: N/A;  . I&d extremity N/A 04/07/2015    Procedure: IRRIGATION AND DEBRIDEMENT ISCHIAL ULCER;  Surgeon: Loel Lofty Dillingham, DO;  Location: [facility];  Service: Plastics;  Laterality: N/A;  . Application of a-cell of extremity N/A 04/07/2015    Procedure: A CELL PLACMENT;  Surgeon: Loel Lofty Dillingham, DO;  Location: [facility];  Service: Plastics;  Laterality: N/A;   Social History   Social History  . Marital Status: Married    Spouse Name: [name]  . Number of Children: 0  . Years of Education: 12   Occupational History  . Retired    Social History Main Topics  . Smoking status: Never Smoker   . Smokeless tobacco: Never Used  . Alcohol Use: No  . Drug Use: No  . Sexual Activity: Not Asked   Other Topics Concern  . None   Social History Narrative   Lives with husband.    Caffeine use: none   Outpatient Encounter Prescriptions as of 12/08/2015  Medication Sig  . amLODipine (NORVASC) 5 MG tablet Take 1 tablet (5 mg total) by mouth daily.  Marland Kitchen atorvastatin (LIPITOR) 40 MG tablet  Take 1 tablet (40 mg total) by mouth every evening.  . Blood Glucose Monitoring Suppl (ACCU-CHEK AVIVA PLUS) w/Device KIT Use as directed 4 times daily  . glucose blood (ACCU-CHEK AVIVA) test strip Use as instructed 4 x daily. E11.65  . insulin aspart protamine - aspart (NOVOLOG MIX 70/30 FLEXPEN) (70-30) 100 UNIT/ML FlexPen Inject 0.08 mLs (8 Units total) into the skin daily with breakfast. Only if blood glucose is above 90  . Lancets (ACCU-CHEK SOFT TOUCH) lancets Use as instructed 4 x daily, E11.65  . levothyroxine (SYNTHROID, LEVOTHROID) 75 MCG tablet Take 1 tablet (75 mcg total) by mouth daily before breakfast.  . metoprolol succinate (TOPROL-XL) 50 MG 24 hr tablet Take 1 tablet (50 mg total) by mouth every morning.  . pantoprazole (PROTONIX) 20 MG tablet Take 20 mg by mouth daily.  Marland Kitchen UNIFINE PENTIPS 32G X 4 MM MISC    No facility-administered encounter medications on file as of 12/08/2015.   ALLERGIES: Allergies  Allergen Reactions  . Aspirin Nausea And Vomiting  . Ciprofloxacin Other (See Comments)    Upset Stomach  . Codeine Nausea And Vomiting    Makes me sick   . Micardis [Telmisartan] Other (See Comments)    unknown  . Nexium [Esomeprazole Magnesium] Other (See Comments)    Causes internal bleeding  . Niaspan [Niacin Er] Other (See Comments)    Reaction is unknown  . Onglyza [Saxagliptin] Other (See Comments)    Reaction is unknown  . Other     No otc pain medications  . Rofecoxib Other (See Comments)    unknown  . Simvastatin   . Tequin [Gatifloxacin] Other (See Comments)    Reaction is unknown  . Welchol [Colesevelam Hcl]   . Amoxicillin Rash   VACCINATION STATUS: Immunization History  Administered Date(s) Administered  . Influenza,inj,Quad PF,36+ Mos 02/16/2013, 02/27/2015  . Pneumococcal Conjugate-13 10/11/2014  . Pneumococcal Polysaccharide-23 10/10/2012  . Tdap 10/06/2012    Diabetes She presents for her follow-up diabetic visit. She has type 2 diabetes  mellitus. Onset time: She was diagnosed at approximate age of 68 years . Her disease course has been improving. There are no hypoglycemic associated symptoms. Pertinent negatives for hypoglycemia include no confusion, headaches, pallor or seizures. Associated symptoms include fatigue and visual change. Pertinent negatives for diabetes include no chest pain, no polydipsia and no polyphagia. There are no hypoglycemic complications. Symptoms are improving. Diabetic complications include nephropathy, peripheral neuropathy, PVD and retinopathy. Risk factors for coronary artery disease include diabetes mellitus, dyslipidemia, hypertension and sedentary lifestyle. Current diabetic treatment includes oral agent (monotherapy). She is compliant with treatment most of the time. Weight trend: Wheelchair-bound. She is following a  generally unhealthy diet. When asked about meal planning, she reported none. She has not had a previous visit with a dietitian. She never participates in exercise. Her home blood glucose trend is increasing steadily (Her glucose profile today is acceptable between 100- 200 mg per DL average.). Her breakfast blood glucose range is generally 110-130 mg/dl. Her dinner blood glucose range is generally 180-200 mg/dl. Her overall blood glucose range is 140-180 mg/dl. An ACE inhibitor/angiotensin II receptor blocker is being taken. Eye exam is current.  Thyroid Problem Presents for initial visit. Onset time: 15 years. Symptoms include fatigue and visual change. Patient reports no cold intolerance, diarrhea, heat intolerance or palpitations. The symptoms have been stable. Past treatments include levothyroxine. The following procedures have not been performed: thyroidectomy.  Hypertension This is a chronic problem. The current episode started more than 1 year ago. Pertinent negatives include no chest pain, headaches, palpitations or shortness of breath. Past treatments include angiotensin blockers.  Hypertensive end-organ damage includes PVD, retinopathy and a thyroid problem.      Review of Systems  Constitutional: Positive for fatigue. Negative for unexpected weight change.  HENT: Negative for trouble swallowing and voice change.   Eyes: Negative for visual disturbance.  Respiratory: Negative for cough, shortness of breath and wheezing.   Cardiovascular: Negative for chest pain, palpitations and leg swelling.  Gastrointestinal: Negative for nausea, vomiting and diarrhea.  Endocrine: Negative for cold intolerance, heat intolerance, polydipsia and polyphagia.  Musculoskeletal: Positive for myalgias, back pain, joint swelling, arthralgias and gait problem.       She is wheelchair-bound due to recent hip fracture and diffuse debilitating arthritis.  Skin: Negative for color change, pallor, rash and wound.  Neurological: Negative for seizures and headaches.  Psychiatric/Behavioral: Negative for suicidal ideas and confusion.    Objective:    BP 109/69 mmHg  Pulse 73  Ht _0  (1.575 m)  Wt 160 lb (72.576 kg)  BMI 29.26 kg/m2  Wt Readings from Last 3 Encounters:  12/08/15 160 lb (72.576 kg)  07/09/15 156 lb (70.761 kg)  07/03/15 153 lb (69.4 kg)    Physical Exam  Constitutional: She is oriented to person, place, and time. She appears well-developed.  HENT:  Head: Normocephalic and atraumatic.  Eyes: EOM are normal.  Neck: Normal range of motion. Neck supple. No tracheal deviation present. No thyromegaly present.  Cardiovascular: Normal rate and regular rhythm.  Exam reveals decreased pulses.   Pulses:      Dorsalis pedis pulses are 0 on the right side, and 0 on the left side.       Posterior tibial pulses are 0 on the right side, and 0 on the left side.  Pulmonary/Chest: Effort normal and breath sounds normal.  Abdominal: Soft. Bowel sounds are normal. There is no tenderness. There is no guarding.  Musculoskeletal: She exhibits no edema.       Arms: She is  wheelchair-bound due to recent hip fracture and diffuse debilitating arthritis.  Neurological: She is alert and oriented to person, place, and time. She has normal reflexes. A sensory deficit is present. No cranial nerve deficit. Coordination normal.  Skin: Skin is warm and dry. No rash noted. No erythema. No pallor.  Psychiatric: She has a normal mood and affect. Judgment normal.    Results for orders placed or performed during the hospital encounter of 12/05/15  Ferritin  Result Value Ref Range   Ferritin 540 (H) 11 - 307 ng/mL  Iron and TIBC  Result Value Ref Range  Iron 65 28 - 170 ug/dL   TIBC 231 (L) 250 - 450 ug/dL   Saturation Ratios 28 10.4 - 31.8 %   UIBC 166 ug/dL  Hemoglobin and hematocrit, blood  Result Value Ref Range   Hemoglobin 12.1 12.0 - 15.0 g/dL   HCT 36.9 36.0 - 46.0 %   Diabetic Labs (most recent): Lab Results  Component Value Date   HGBA1C 7.3* 11/21/2015   HGBA1C 7.6 07/09/2015   HGBA1C 10.0* 04/02/2015   Lipid Panel     Component Value Date/Time   CHOL 153 10/11/2014 1159   CHOL 105 09/11/2012 1057   TRIG 170* 10/11/2014 1159   TRIG 79 09/11/2012 1057   HDL 53 10/11/2014 1159   HDL 41 09/11/2012 1057   LDLCALC 50 02/14/2014 1012   LDLCALC 48 09/11/2012 1057     Assessment & Plan:   1. Type 2 diabetes mellitus with stage 4 chronic kidney disease, without long-term current use of insulin ([place])   - Patient has currently uncontrolled symptomatic type 2 DM since  70 years of age,  with most recent A1c of 7.3% improving from  10 %. Recent labs reviewed.   -her  diabetes is complicated by PAD, CKD, neuropathy and patient remains at a high risk for more acute and chronic complications of diabetes which include CAD, CVA, CKD, retinopathy, and neuropathy. These are all discussed in detail with the patient.  - I have counseled the patient on diet management  by adopting a carbohydrate restricted/protein rich diet.  - Suggestion is made for patient  to avoid simple carbohydrates   from their diet including Cakes , Desserts, Ice Cream,  Soda (  diet and regular) , Sweet Tea , Candies,  Chips, Cookies, Artificial Sweeteners,   and "Sugar-free" Products . This will help patient to have stable blood glucose profile and potentially avoid unintended weight gain.  - I encouraged the patient to switch to  unprocessed or minimally processed complex starch and increased protein intake (animal or plant source), fruits, and vegetables.  - Patient is advised to stick to a routine mealtimes to eat 3 meals  a day and avoid unnecessary snacks ( to snack only to correct hypoglycemia).  - The patient will be scheduled with Jearld Fenton, RDN, CDE for individualized DM education.  - I have approached patient with the following individualized plan to manage diabetes and patient agrees:   - She came with better and safer blood glucose profile .  -Her husband offers to help. I will continue NovoLog 70/30   8 units only with breakfast for pre-meal glucose above 90 mg/dL associated with monitoring of blood glucose  before meals and at bedtime. -Husband is a neck caretaker now, I advised him to avoid injecting insulin at bedtime. - First priority in this patient would be to avoid hypoglycemia.  -If she can't control glycemia with one shot of insulin a day she will be switched to longer acting insulin.  if she cannot perform this therapy even with the help of her husband, her best option is placement in skilled care facility.  -Patient is encouraged to call clinic for blood glucose levels less than 70 or above 300 mg /dl. - I will  discontinue  Tradjenta  for cost reasons.  -Patient is not a candidate for MTF, Incretin therapy.  - Patient specific target  A1c;  LDL, HDL, Triglycerides, and  Waist Circumference were discussed in detail.  2) BP/HTN: Controlled. Continue current medications including ACEI/ARB. 3)  Lipids/HPL:  continue statins. 4)  Weight/Diet: CDE  Consult has been initiated, she has limited ability to exercise.  5)  Hypothyroidism:  - continue LT4 75 mcg po qam.  - We discussed about correct intake of levothyroxine, at fasting, with water, separated by at least 30 minutes from breakfast, and separated by more than 4 hours from calcium, iron, multivitamins, acid reflux medications (PPIs). -Patient is made aware of the fact that thyroid hormone replacement is needed for life, dose to be adjusted by periodic monitoring of thyroid function tests.  5) Chronic Care/Health Maintenance:  -Patient is  on ACEI/ARB and Statin medications and encouraged to continue to follow up with Ophthalmology, Podiatrist at least yearly or according to recommendations, and advised to   stay away from smoking. I have recommended yearly flu vaccine and pneumonia vaccination at least every 5 years; moderate intensity exercise for up to 150 minutes weekly; and  sleep for at least 7 hours a day.  - 20 minutes of time was spent on the care of this patient , 50% of which was applied for counseling on diabetes complications and their preventions.  - Patient to bring meter and  blood glucose logs during their next visit in 2 weeks.   - I advised patient to maintain close follow up with Eustaquio Maize, MD for primary care needs.  Follow up plan: - Return in about 3 months (around 03/09/2016) for follow up with pre-visit labs, meter, and logs.  Glade Lloyd, MD Phone: [phone]  Fax: [phone]   12/08/2015, 2:45 PM

## 2015-12-10 DIAGNOSIS — E1122 Type 2 diabetes mellitus with diabetic chronic kidney disease: Secondary | ICD-10-CM | POA: Diagnosis not present

## 2015-12-10 DIAGNOSIS — I5030 Unspecified diastolic (congestive) heart failure: Secondary | ICD-10-CM | POA: Diagnosis not present

## 2015-12-10 DIAGNOSIS — I130 Hypertensive heart and chronic kidney disease with heart failure and stage 1 through stage 4 chronic kidney disease, or unspecified chronic kidney disease: Secondary | ICD-10-CM | POA: Diagnosis not present

## 2015-12-10 DIAGNOSIS — K604 Rectal fistula: Secondary | ICD-10-CM | POA: Diagnosis not present

## 2015-12-10 DIAGNOSIS — N184 Chronic kidney disease, stage 4 (severe): Secondary | ICD-10-CM | POA: Diagnosis not present

## 2015-12-10 DIAGNOSIS — E11649 Type 2 diabetes mellitus with hypoglycemia without coma: Secondary | ICD-10-CM | POA: Diagnosis not present

## 2015-12-10 NOTE — Progress Notes (Signed)
Results for [name], [name] (MRN [id number]) as of 12/10/2015 08:35  Ref. Range 12/05/2015 09:15  Iron Latest Ref Range: 28-170 ug/dL 65  UIBC Latest Units: ug/dL 166  TIBC Latest Ref Range: 250-450 ug/dL 231 (L)  Saturation Ratios Latest Ref Range: 10.4-31.8 % 28  Ferritin Latest Ref Range: 11-307 ng/mL 540 (H)  Hemoglobin Latest Ref Range: 12.0-15.0 g/dL 12.1  HCT Latest Ref Range: 36.0-46.0 % 36.9

## 2015-12-12 DIAGNOSIS — I5030 Unspecified diastolic (congestive) heart failure: Secondary | ICD-10-CM | POA: Diagnosis not present

## 2015-12-12 DIAGNOSIS — N184 Chronic kidney disease, stage 4 (severe): Secondary | ICD-10-CM | POA: Diagnosis not present

## 2015-12-12 DIAGNOSIS — I130 Hypertensive heart and chronic kidney disease with heart failure and stage 1 through stage 4 chronic kidney disease, or unspecified chronic kidney disease: Secondary | ICD-10-CM | POA: Diagnosis not present

## 2015-12-12 DIAGNOSIS — E11649 Type 2 diabetes mellitus with hypoglycemia without coma: Secondary | ICD-10-CM | POA: Diagnosis not present

## 2015-12-12 DIAGNOSIS — K604 Rectal fistula: Secondary | ICD-10-CM | POA: Diagnosis not present

## 2015-12-12 DIAGNOSIS — E1122 Type 2 diabetes mellitus with diabetic chronic kidney disease: Secondary | ICD-10-CM | POA: Diagnosis not present

## 2015-12-15 DIAGNOSIS — E11649 Type 2 diabetes mellitus with hypoglycemia without coma: Secondary | ICD-10-CM | POA: Diagnosis not present

## 2015-12-15 DIAGNOSIS — E1122 Type 2 diabetes mellitus with diabetic chronic kidney disease: Secondary | ICD-10-CM | POA: Diagnosis not present

## 2015-12-15 DIAGNOSIS — I130 Hypertensive heart and chronic kidney disease with heart failure and stage 1 through stage 4 chronic kidney disease, or unspecified chronic kidney disease: Secondary | ICD-10-CM | POA: Diagnosis not present

## 2015-12-15 DIAGNOSIS — K604 Rectal fistula: Secondary | ICD-10-CM | POA: Diagnosis not present

## 2015-12-15 DIAGNOSIS — I5030 Unspecified diastolic (congestive) heart failure: Secondary | ICD-10-CM | POA: Diagnosis not present

## 2015-12-15 DIAGNOSIS — N184 Chronic kidney disease, stage 4 (severe): Secondary | ICD-10-CM | POA: Diagnosis not present

## 2015-12-17 DIAGNOSIS — N184 Chronic kidney disease, stage 4 (severe): Secondary | ICD-10-CM | POA: Diagnosis not present

## 2015-12-17 DIAGNOSIS — K604 Rectal fistula: Secondary | ICD-10-CM | POA: Diagnosis not present

## 2015-12-17 DIAGNOSIS — I5030 Unspecified diastolic (congestive) heart failure: Secondary | ICD-10-CM | POA: Diagnosis not present

## 2015-12-17 DIAGNOSIS — I130 Hypertensive heart and chronic kidney disease with heart failure and stage 1 through stage 4 chronic kidney disease, or unspecified chronic kidney disease: Secondary | ICD-10-CM | POA: Diagnosis not present

## 2015-12-17 DIAGNOSIS — E1122 Type 2 diabetes mellitus with diabetic chronic kidney disease: Secondary | ICD-10-CM | POA: Diagnosis not present

## 2015-12-17 DIAGNOSIS — E11649 Type 2 diabetes mellitus with hypoglycemia without coma: Secondary | ICD-10-CM | POA: Diagnosis not present

## 2015-12-19 ENCOUNTER — Other Ambulatory Visit: Payer: Self-pay

## 2015-12-19 DIAGNOSIS — E1122 Type 2 diabetes mellitus with diabetic chronic kidney disease: Secondary | ICD-10-CM | POA: Diagnosis not present

## 2015-12-19 DIAGNOSIS — E11649 Type 2 diabetes mellitus with hypoglycemia without coma: Secondary | ICD-10-CM | POA: Diagnosis not present

## 2015-12-19 DIAGNOSIS — I5030 Unspecified diastolic (congestive) heart failure: Secondary | ICD-10-CM | POA: Diagnosis not present

## 2015-12-19 DIAGNOSIS — I130 Hypertensive heart and chronic kidney disease with heart failure and stage 1 through stage 4 chronic kidney disease, or unspecified chronic kidney disease: Secondary | ICD-10-CM | POA: Diagnosis not present

## 2015-12-19 DIAGNOSIS — K604 Rectal fistula: Secondary | ICD-10-CM | POA: Diagnosis not present

## 2015-12-19 DIAGNOSIS — N184 Chronic kidney disease, stage 4 (severe): Secondary | ICD-10-CM | POA: Diagnosis not present

## 2015-12-19 MED ORDER — GLUCOSE BLOOD VI STRP: ORAL_STRIP | 5 refills | Status: DC

## 2015-12-22 DIAGNOSIS — K604 Rectal fistula: Secondary | ICD-10-CM | POA: Diagnosis not present

## 2015-12-22 DIAGNOSIS — E11649 Type 2 diabetes mellitus with hypoglycemia without coma: Secondary | ICD-10-CM | POA: Diagnosis not present

## 2015-12-22 DIAGNOSIS — I130 Hypertensive heart and chronic kidney disease with heart failure and stage 1 through stage 4 chronic kidney disease, or unspecified chronic kidney disease: Secondary | ICD-10-CM | POA: Diagnosis not present

## 2015-12-22 DIAGNOSIS — E1122 Type 2 diabetes mellitus with diabetic chronic kidney disease: Secondary | ICD-10-CM | POA: Diagnosis not present

## 2015-12-22 DIAGNOSIS — I5030 Unspecified diastolic (congestive) heart failure: Secondary | ICD-10-CM | POA: Diagnosis not present

## 2015-12-22 DIAGNOSIS — N184 Chronic kidney disease, stage 4 (severe): Secondary | ICD-10-CM | POA: Diagnosis not present

## 2015-12-23 ENCOUNTER — Ambulatory Visit: Payer: Medicare Other

## 2015-12-24 ENCOUNTER — Ambulatory Visit: Payer: Medicare Other | Admitting: Pediatrics

## 2015-12-24 DIAGNOSIS — E1122 Type 2 diabetes mellitus with diabetic chronic kidney disease: Secondary | ICD-10-CM | POA: Diagnosis not present

## 2015-12-24 DIAGNOSIS — I130 Hypertensive heart and chronic kidney disease with heart failure and stage 1 through stage 4 chronic kidney disease, or unspecified chronic kidney disease: Secondary | ICD-10-CM | POA: Diagnosis not present

## 2015-12-24 DIAGNOSIS — K604 Rectal fistula: Secondary | ICD-10-CM | POA: Diagnosis not present

## 2015-12-24 DIAGNOSIS — E11649 Type 2 diabetes mellitus with hypoglycemia without coma: Secondary | ICD-10-CM | POA: Diagnosis not present

## 2015-12-24 DIAGNOSIS — I5030 Unspecified diastolic (congestive) heart failure: Secondary | ICD-10-CM | POA: Diagnosis not present

## 2015-12-24 DIAGNOSIS — N184 Chronic kidney disease, stage 4 (severe): Secondary | ICD-10-CM | POA: Diagnosis not present

## 2015-12-25 ENCOUNTER — Encounter (HOSPITAL_BASED_OUTPATIENT_CLINIC_OR_DEPARTMENT_OTHER): Payer: Medicare Other | Attending: Internal Medicine

## 2015-12-25 DIAGNOSIS — T8189XA Other complications of procedures, not elsewhere classified, initial encounter: Secondary | ICD-10-CM | POA: Insufficient documentation

## 2015-12-25 DIAGNOSIS — K604 Rectal fistula: Secondary | ICD-10-CM | POA: Diagnosis not present

## 2015-12-25 DIAGNOSIS — E1121 Type 2 diabetes mellitus with diabetic nephropathy: Secondary | ICD-10-CM | POA: Insufficient documentation

## 2015-12-25 DIAGNOSIS — T8131XA Disruption of external operation (surgical) wound, not elsewhere classified, initial encounter: Secondary | ICD-10-CM | POA: Diagnosis not present

## 2015-12-25 DIAGNOSIS — Y838 Other surgical procedures as the cause of abnormal reaction of the patient, or of later complication, without mention of misadventure at the time of the procedure: Secondary | ICD-10-CM | POA: Insufficient documentation

## 2015-12-26 DIAGNOSIS — K604 Rectal fistula: Secondary | ICD-10-CM | POA: Diagnosis not present

## 2015-12-26 DIAGNOSIS — N184 Chronic kidney disease, stage 4 (severe): Secondary | ICD-10-CM | POA: Diagnosis not present

## 2015-12-26 DIAGNOSIS — I130 Hypertensive heart and chronic kidney disease with heart failure and stage 1 through stage 4 chronic kidney disease, or unspecified chronic kidney disease: Secondary | ICD-10-CM | POA: Diagnosis not present

## 2015-12-26 DIAGNOSIS — E11649 Type 2 diabetes mellitus with hypoglycemia without coma: Secondary | ICD-10-CM | POA: Diagnosis not present

## 2015-12-26 DIAGNOSIS — E1122 Type 2 diabetes mellitus with diabetic chronic kidney disease: Secondary | ICD-10-CM | POA: Diagnosis not present

## 2015-12-26 DIAGNOSIS — I5030 Unspecified diastolic (congestive) heart failure: Secondary | ICD-10-CM | POA: Diagnosis not present

## 2015-12-29 ENCOUNTER — Encounter (HOSPITAL_COMMUNITY)
Admission: RE | Admit: 2015-12-29 | Discharge: 2015-12-29 | Disposition: A | Discharge status: Home / Self Care | Payer: Medicare Other | Source: Ambulatory Visit | Attending: Nephrology | Admitting: Nephrology

## 2015-12-29 DIAGNOSIS — N183 Chronic kidney disease, stage 3 (moderate): Secondary | ICD-10-CM | POA: Diagnosis not present

## 2015-12-29 DIAGNOSIS — N184 Chronic kidney disease, stage 4 (severe): Secondary | ICD-10-CM | POA: Diagnosis not present

## 2015-12-29 DIAGNOSIS — E1122 Type 2 diabetes mellitus with diabetic chronic kidney disease: Secondary | ICD-10-CM | POA: Diagnosis not present

## 2015-12-29 DIAGNOSIS — D638 Anemia in other chronic diseases classified elsewhere: Secondary | ICD-10-CM | POA: Diagnosis not present

## 2015-12-29 DIAGNOSIS — I130 Hypertensive heart and chronic kidney disease with heart failure and stage 1 through stage 4 chronic kidney disease, or unspecified chronic kidney disease: Secondary | ICD-10-CM | POA: Diagnosis not present

## 2015-12-29 DIAGNOSIS — E11649 Type 2 diabetes mellitus with hypoglycemia without coma: Secondary | ICD-10-CM | POA: Diagnosis not present

## 2015-12-29 DIAGNOSIS — K604 Rectal fistula: Secondary | ICD-10-CM | POA: Diagnosis not present

## 2015-12-29 DIAGNOSIS — I5030 Unspecified diastolic (congestive) heart failure: Secondary | ICD-10-CM | POA: Diagnosis not present

## 2015-12-29 LAB — HEMOGLOBIN AND HEMATOCRIT, BLOOD
HCT: 38.9 % (ref 36.0–46.0)
HEMOGLOBIN: 12.7 g/dL (ref 12.0–15.0)

## 2015-12-29 NOTE — Progress Notes (Signed)
Results for MERIN, DELREAL (MRN [id number]) as of 12/29/2015 09:51  Ref. Range 12/29/2015 09:30  Hemoglobin Latest Ref Range: 12.0 - 15.0 g/dL 12.7  HCT Latest Ref Range: 36.0 - 46.0 % 38.9

## 2015-12-30 DIAGNOSIS — N183 Chronic kidney disease, stage 3 (moderate): Secondary | ICD-10-CM | POA: Diagnosis not present

## 2015-12-30 DIAGNOSIS — R809 Proteinuria, unspecified: Secondary | ICD-10-CM | POA: Diagnosis not present

## 2015-12-30 DIAGNOSIS — N2581 Secondary hyperparathyroidism of renal origin: Secondary | ICD-10-CM | POA: Diagnosis not present

## 2015-12-30 DIAGNOSIS — I129 Hypertensive chronic kidney disease with stage 1 through stage 4 chronic kidney disease, or unspecified chronic kidney disease: Secondary | ICD-10-CM | POA: Diagnosis not present

## 2015-12-30 DIAGNOSIS — N184 Chronic kidney disease, stage 4 (severe): Secondary | ICD-10-CM | POA: Diagnosis not present

## 2015-12-30 DIAGNOSIS — D631 Anemia in chronic kidney disease: Secondary | ICD-10-CM | POA: Diagnosis not present

## 2015-12-31 DIAGNOSIS — K604 Rectal fistula: Secondary | ICD-10-CM | POA: Diagnosis not present

## 2015-12-31 DIAGNOSIS — E1122 Type 2 diabetes mellitus with diabetic chronic kidney disease: Secondary | ICD-10-CM | POA: Diagnosis not present

## 2015-12-31 DIAGNOSIS — I5030 Unspecified diastolic (congestive) heart failure: Secondary | ICD-10-CM | POA: Diagnosis not present

## 2015-12-31 DIAGNOSIS — N184 Chronic kidney disease, stage 4 (severe): Secondary | ICD-10-CM | POA: Diagnosis not present

## 2015-12-31 DIAGNOSIS — E11649 Type 2 diabetes mellitus with hypoglycemia without coma: Secondary | ICD-10-CM | POA: Diagnosis not present

## 2015-12-31 DIAGNOSIS — I130 Hypertensive heart and chronic kidney disease with heart failure and stage 1 through stage 4 chronic kidney disease, or unspecified chronic kidney disease: Secondary | ICD-10-CM | POA: Diagnosis not present

## 2016-01-02 DIAGNOSIS — I5030 Unspecified diastolic (congestive) heart failure: Secondary | ICD-10-CM | POA: Diagnosis not present

## 2016-01-02 DIAGNOSIS — K604 Rectal fistula: Secondary | ICD-10-CM | POA: Diagnosis not present

## 2016-01-02 DIAGNOSIS — E1122 Type 2 diabetes mellitus with diabetic chronic kidney disease: Secondary | ICD-10-CM | POA: Diagnosis not present

## 2016-01-02 DIAGNOSIS — I130 Hypertensive heart and chronic kidney disease with heart failure and stage 1 through stage 4 chronic kidney disease, or unspecified chronic kidney disease: Secondary | ICD-10-CM | POA: Diagnosis not present

## 2016-01-02 DIAGNOSIS — E11649 Type 2 diabetes mellitus with hypoglycemia without coma: Secondary | ICD-10-CM | POA: Diagnosis not present

## 2016-01-02 DIAGNOSIS — N184 Chronic kidney disease, stage 4 (severe): Secondary | ICD-10-CM | POA: Diagnosis not present

## 2016-01-06 DIAGNOSIS — I130 Hypertensive heart and chronic kidney disease with heart failure and stage 1 through stage 4 chronic kidney disease, or unspecified chronic kidney disease: Secondary | ICD-10-CM | POA: Diagnosis not present

## 2016-01-06 DIAGNOSIS — K604 Rectal fistula: Secondary | ICD-10-CM | POA: Diagnosis not present

## 2016-01-06 DIAGNOSIS — E11649 Type 2 diabetes mellitus with hypoglycemia without coma: Secondary | ICD-10-CM | POA: Diagnosis not present

## 2016-01-06 DIAGNOSIS — N184 Chronic kidney disease, stage 4 (severe): Secondary | ICD-10-CM | POA: Diagnosis not present

## 2016-01-06 DIAGNOSIS — I5030 Unspecified diastolic (congestive) heart failure: Secondary | ICD-10-CM | POA: Diagnosis not present

## 2016-01-06 DIAGNOSIS — E1122 Type 2 diabetes mellitus with diabetic chronic kidney disease: Secondary | ICD-10-CM | POA: Diagnosis not present

## 2016-01-08 DIAGNOSIS — E11649 Type 2 diabetes mellitus with hypoglycemia without coma: Secondary | ICD-10-CM | POA: Diagnosis not present

## 2016-01-08 DIAGNOSIS — I5030 Unspecified diastolic (congestive) heart failure: Secondary | ICD-10-CM | POA: Diagnosis not present

## 2016-01-08 DIAGNOSIS — K604 Rectal fistula: Secondary | ICD-10-CM | POA: Diagnosis not present

## 2016-01-08 DIAGNOSIS — E1122 Type 2 diabetes mellitus with diabetic chronic kidney disease: Secondary | ICD-10-CM | POA: Diagnosis not present

## 2016-01-08 DIAGNOSIS — I130 Hypertensive heart and chronic kidney disease with heart failure and stage 1 through stage 4 chronic kidney disease, or unspecified chronic kidney disease: Secondary | ICD-10-CM | POA: Diagnosis not present

## 2016-01-08 DIAGNOSIS — N184 Chronic kidney disease, stage 4 (severe): Secondary | ICD-10-CM | POA: Diagnosis not present

## 2016-01-09 DIAGNOSIS — E1122 Type 2 diabetes mellitus with diabetic chronic kidney disease: Secondary | ICD-10-CM | POA: Diagnosis not present

## 2016-01-09 DIAGNOSIS — E11649 Type 2 diabetes mellitus with hypoglycemia without coma: Secondary | ICD-10-CM | POA: Diagnosis not present

## 2016-01-09 DIAGNOSIS — K604 Rectal fistula: Secondary | ICD-10-CM | POA: Diagnosis not present

## 2016-01-09 DIAGNOSIS — N184 Chronic kidney disease, stage 4 (severe): Secondary | ICD-10-CM | POA: Diagnosis not present

## 2016-01-09 DIAGNOSIS — I5030 Unspecified diastolic (congestive) heart failure: Secondary | ICD-10-CM | POA: Diagnosis not present

## 2016-01-09 DIAGNOSIS — I130 Hypertensive heart and chronic kidney disease with heart failure and stage 1 through stage 4 chronic kidney disease, or unspecified chronic kidney disease: Secondary | ICD-10-CM | POA: Diagnosis not present

## 2016-01-12 DIAGNOSIS — E11649 Type 2 diabetes mellitus with hypoglycemia without coma: Secondary | ICD-10-CM | POA: Diagnosis not present

## 2016-01-12 DIAGNOSIS — I130 Hypertensive heart and chronic kidney disease with heart failure and stage 1 through stage 4 chronic kidney disease, or unspecified chronic kidney disease: Secondary | ICD-10-CM | POA: Diagnosis not present

## 2016-01-12 DIAGNOSIS — K604 Rectal fistula: Secondary | ICD-10-CM | POA: Diagnosis not present

## 2016-01-12 DIAGNOSIS — E1122 Type 2 diabetes mellitus with diabetic chronic kidney disease: Secondary | ICD-10-CM | POA: Diagnosis not present

## 2016-01-12 DIAGNOSIS — I5030 Unspecified diastolic (congestive) heart failure: Secondary | ICD-10-CM | POA: Diagnosis not present

## 2016-01-12 DIAGNOSIS — N184 Chronic kidney disease, stage 4 (severe): Secondary | ICD-10-CM | POA: Diagnosis not present

## 2016-01-14 DIAGNOSIS — K604 Rectal fistula: Secondary | ICD-10-CM | POA: Diagnosis not present

## 2016-01-14 DIAGNOSIS — I5030 Unspecified diastolic (congestive) heart failure: Secondary | ICD-10-CM | POA: Diagnosis not present

## 2016-01-14 DIAGNOSIS — N184 Chronic kidney disease, stage 4 (severe): Secondary | ICD-10-CM | POA: Diagnosis not present

## 2016-01-14 DIAGNOSIS — I130 Hypertensive heart and chronic kidney disease with heart failure and stage 1 through stage 4 chronic kidney disease, or unspecified chronic kidney disease: Secondary | ICD-10-CM | POA: Diagnosis not present

## 2016-01-14 DIAGNOSIS — E1122 Type 2 diabetes mellitus with diabetic chronic kidney disease: Secondary | ICD-10-CM | POA: Diagnosis not present

## 2016-01-14 DIAGNOSIS — E11649 Type 2 diabetes mellitus with hypoglycemia without coma: Secondary | ICD-10-CM | POA: Diagnosis not present

## 2016-01-15 DIAGNOSIS — T8131XA Disruption of external operation (surgical) wound, not elsewhere classified, initial encounter: Secondary | ICD-10-CM | POA: Diagnosis not present

## 2016-01-15 DIAGNOSIS — E1121 Type 2 diabetes mellitus with diabetic nephropathy: Secondary | ICD-10-CM | POA: Diagnosis not present

## 2016-01-15 DIAGNOSIS — K604 Rectal fistula: Secondary | ICD-10-CM | POA: Diagnosis not present

## 2016-01-15 DIAGNOSIS — T8189XA Other complications of procedures, not elsewhere classified, initial encounter: Secondary | ICD-10-CM | POA: Diagnosis not present

## 2016-01-15 DIAGNOSIS — S31809S Unspecified open wound of unspecified buttock, sequela: Secondary | ICD-10-CM | POA: Diagnosis not present

## 2016-01-16 DIAGNOSIS — N184 Chronic kidney disease, stage 4 (severe): Secondary | ICD-10-CM | POA: Diagnosis not present

## 2016-01-16 DIAGNOSIS — E1122 Type 2 diabetes mellitus with diabetic chronic kidney disease: Secondary | ICD-10-CM | POA: Diagnosis not present

## 2016-01-16 DIAGNOSIS — I5030 Unspecified diastolic (congestive) heart failure: Secondary | ICD-10-CM | POA: Diagnosis not present

## 2016-01-16 DIAGNOSIS — I130 Hypertensive heart and chronic kidney disease with heart failure and stage 1 through stage 4 chronic kidney disease, or unspecified chronic kidney disease: Secondary | ICD-10-CM | POA: Diagnosis not present

## 2016-01-16 DIAGNOSIS — K604 Rectal fistula: Secondary | ICD-10-CM | POA: Diagnosis not present

## 2016-01-16 DIAGNOSIS — E11649 Type 2 diabetes mellitus with hypoglycemia without coma: Secondary | ICD-10-CM | POA: Diagnosis not present

## 2016-01-19 ENCOUNTER — Encounter (HOSPITAL_COMMUNITY)
Admission: RE | Admit: 2016-01-19 | Discharge: 2016-01-19 | Disposition: A | Discharge status: Home / Self Care | Payer: Medicare Other | Source: Ambulatory Visit | Attending: Nephrology | Admitting: Nephrology

## 2016-01-19 DIAGNOSIS — I130 Hypertensive heart and chronic kidney disease with heart failure and stage 1 through stage 4 chronic kidney disease, or unspecified chronic kidney disease: Secondary | ICD-10-CM | POA: Diagnosis not present

## 2016-01-19 DIAGNOSIS — E11649 Type 2 diabetes mellitus with hypoglycemia without coma: Secondary | ICD-10-CM | POA: Diagnosis not present

## 2016-01-19 DIAGNOSIS — I5030 Unspecified diastolic (congestive) heart failure: Secondary | ICD-10-CM | POA: Diagnosis not present

## 2016-01-19 DIAGNOSIS — K604 Rectal fistula: Secondary | ICD-10-CM | POA: Diagnosis not present

## 2016-01-19 DIAGNOSIS — E1122 Type 2 diabetes mellitus with diabetic chronic kidney disease: Secondary | ICD-10-CM | POA: Diagnosis not present

## 2016-01-19 DIAGNOSIS — N184 Chronic kidney disease, stage 4 (severe): Secondary | ICD-10-CM | POA: Diagnosis not present

## 2016-01-20 ENCOUNTER — Encounter (HOSPITAL_COMMUNITY)
Admission: RE | Admit: 2016-01-20 | Discharge: 2016-01-20 | Disposition: A | Discharge status: Home / Self Care | Payer: Medicare Other | Source: Ambulatory Visit | Attending: Nephrology | Admitting: Nephrology

## 2016-01-20 ENCOUNTER — Encounter (HOSPITAL_COMMUNITY): Payer: Self-pay

## 2016-01-20 DIAGNOSIS — S31000D Unspecified open wound of lower back and pelvis without penetration into retroperitoneum, subsequent encounter: Secondary | ICD-10-CM | POA: Diagnosis not present

## 2016-01-20 DIAGNOSIS — N183 Chronic kidney disease, stage 3 (moderate): Secondary | ICD-10-CM | POA: Diagnosis not present

## 2016-01-20 DIAGNOSIS — S300XXA Contusion of lower back and pelvis, initial encounter: Secondary | ICD-10-CM | POA: Diagnosis not present

## 2016-01-20 DIAGNOSIS — D638 Anemia in other chronic diseases classified elsewhere: Secondary | ICD-10-CM | POA: Diagnosis not present

## 2016-01-20 LAB — BASIC METABOLIC PANEL
ANION GAP: 7 (ref 5–15)
BUN: 23 mg/dL — AB (ref 6–20)
CALCIUM: 9.1 mg/dL (ref 8.9–10.3)
CO2: 20 mmol/L — AB (ref 22–32)
CREATININE: 1.38 mg/dL — AB (ref 0.44–1.00)
Chloride: 110 mmol/L (ref 101–111)
GFR calc Af Amer: 44 mL/min — ABNORMAL LOW (ref >60)
GFR calc non Af Amer: 38 mL/min — ABNORMAL LOW (ref >60)
Glucose, Bld: 177 mg/dL — ABNORMAL HIGH (ref 65–99)
POTASSIUM: 4.8 mmol/L (ref 3.5–5.1)
Sodium: 137 mmol/L (ref 135–145)

## 2016-01-20 LAB — HEMOGLOBIN AND HEMATOCRIT, BLOOD
HCT: 39.1 % (ref 36.0–46.0)
Hemoglobin: 13 g/dL (ref 12.0–15.0)

## 2016-01-20 NOTE — Progress Notes (Signed)
Results for [name], [name] (MRN [id number]) as of 01/20/2016 11:49  Ref. Range 01/20/2016 10:30  Hemoglobin Latest Ref Range: 12.0 - 15.0 g/dL 13.0  HCT Latest Ref Range: 36.0 - 46.0 % 39.1  Aranesp 128mcg held today for hgb 13 g/dl.  Patient stated she was instructed she did not need any more appointments after today's visit and would wait for her physician to contact for further instructions.  Patient refused follow up appointment in 3 weeks for recheck of lab work.

## 2016-01-21 DIAGNOSIS — N184 Chronic kidney disease, stage 4 (severe): Secondary | ICD-10-CM | POA: Diagnosis not present

## 2016-01-21 DIAGNOSIS — K604 Rectal fistula: Secondary | ICD-10-CM | POA: Diagnosis not present

## 2016-01-21 DIAGNOSIS — E11649 Type 2 diabetes mellitus with hypoglycemia without coma: Secondary | ICD-10-CM | POA: Diagnosis not present

## 2016-01-21 DIAGNOSIS — I5030 Unspecified diastolic (congestive) heart failure: Secondary | ICD-10-CM | POA: Diagnosis not present

## 2016-01-21 DIAGNOSIS — I130 Hypertensive heart and chronic kidney disease with heart failure and stage 1 through stage 4 chronic kidney disease, or unspecified chronic kidney disease: Secondary | ICD-10-CM | POA: Diagnosis not present

## 2016-01-21 DIAGNOSIS — E1122 Type 2 diabetes mellitus with diabetic chronic kidney disease: Secondary | ICD-10-CM | POA: Diagnosis not present

## 2016-01-23 DIAGNOSIS — I130 Hypertensive heart and chronic kidney disease with heart failure and stage 1 through stage 4 chronic kidney disease, or unspecified chronic kidney disease: Secondary | ICD-10-CM | POA: Diagnosis not present

## 2016-01-23 DIAGNOSIS — E11649 Type 2 diabetes mellitus with hypoglycemia without coma: Secondary | ICD-10-CM | POA: Diagnosis not present

## 2016-01-23 DIAGNOSIS — E1122 Type 2 diabetes mellitus with diabetic chronic kidney disease: Secondary | ICD-10-CM | POA: Diagnosis not present

## 2016-01-23 DIAGNOSIS — I5030 Unspecified diastolic (congestive) heart failure: Secondary | ICD-10-CM | POA: Diagnosis not present

## 2016-01-23 DIAGNOSIS — K604 Rectal fistula: Secondary | ICD-10-CM | POA: Diagnosis not present

## 2016-01-23 DIAGNOSIS — N184 Chronic kidney disease, stage 4 (severe): Secondary | ICD-10-CM | POA: Diagnosis not present

## 2016-01-27 DIAGNOSIS — N184 Chronic kidney disease, stage 4 (severe): Secondary | ICD-10-CM | POA: Diagnosis not present

## 2016-01-27 DIAGNOSIS — I5030 Unspecified diastolic (congestive) heart failure: Secondary | ICD-10-CM | POA: Diagnosis not present

## 2016-01-27 DIAGNOSIS — E11649 Type 2 diabetes mellitus with hypoglycemia without coma: Secondary | ICD-10-CM | POA: Diagnosis not present

## 2016-01-27 DIAGNOSIS — B351 Tinea unguium: Secondary | ICD-10-CM | POA: Diagnosis not present

## 2016-01-27 DIAGNOSIS — I130 Hypertensive heart and chronic kidney disease with heart failure and stage 1 through stage 4 chronic kidney disease, or unspecified chronic kidney disease: Secondary | ICD-10-CM | POA: Diagnosis not present

## 2016-01-27 DIAGNOSIS — L84 Corns and callosities: Secondary | ICD-10-CM | POA: Diagnosis not present

## 2016-01-27 DIAGNOSIS — E1122 Type 2 diabetes mellitus with diabetic chronic kidney disease: Secondary | ICD-10-CM | POA: Diagnosis not present

## 2016-01-27 DIAGNOSIS — E1151 Type 2 diabetes mellitus with diabetic peripheral angiopathy without gangrene: Secondary | ICD-10-CM | POA: Diagnosis not present

## 2016-01-27 DIAGNOSIS — K604 Rectal fistula: Secondary | ICD-10-CM | POA: Diagnosis not present

## 2016-01-28 DIAGNOSIS — I130 Hypertensive heart and chronic kidney disease with heart failure and stage 1 through stage 4 chronic kidney disease, or unspecified chronic kidney disease: Secondary | ICD-10-CM | POA: Diagnosis not present

## 2016-01-28 DIAGNOSIS — E11649 Type 2 diabetes mellitus with hypoglycemia without coma: Secondary | ICD-10-CM | POA: Diagnosis not present

## 2016-01-28 DIAGNOSIS — K604 Rectal fistula: Secondary | ICD-10-CM | POA: Diagnosis not present

## 2016-01-28 DIAGNOSIS — E1122 Type 2 diabetes mellitus with diabetic chronic kidney disease: Secondary | ICD-10-CM | POA: Diagnosis not present

## 2016-01-28 DIAGNOSIS — I5030 Unspecified diastolic (congestive) heart failure: Secondary | ICD-10-CM | POA: Diagnosis not present

## 2016-01-28 DIAGNOSIS — N184 Chronic kidney disease, stage 4 (severe): Secondary | ICD-10-CM | POA: Diagnosis not present

## 2016-01-30 DIAGNOSIS — K604 Rectal fistula: Secondary | ICD-10-CM | POA: Diagnosis not present

## 2016-01-30 DIAGNOSIS — I130 Hypertensive heart and chronic kidney disease with heart failure and stage 1 through stage 4 chronic kidney disease, or unspecified chronic kidney disease: Secondary | ICD-10-CM | POA: Diagnosis not present

## 2016-01-30 DIAGNOSIS — E11649 Type 2 diabetes mellitus with hypoglycemia without coma: Secondary | ICD-10-CM | POA: Diagnosis not present

## 2016-01-30 DIAGNOSIS — N184 Chronic kidney disease, stage 4 (severe): Secondary | ICD-10-CM | POA: Diagnosis not present

## 2016-01-30 DIAGNOSIS — E1122 Type 2 diabetes mellitus with diabetic chronic kidney disease: Secondary | ICD-10-CM | POA: Diagnosis not present

## 2016-01-30 DIAGNOSIS — I5030 Unspecified diastolic (congestive) heart failure: Secondary | ICD-10-CM | POA: Diagnosis not present

## 2016-02-02 DIAGNOSIS — K604 Rectal fistula: Secondary | ICD-10-CM | POA: Diagnosis not present

## 2016-02-02 DIAGNOSIS — E11649 Type 2 diabetes mellitus with hypoglycemia without coma: Secondary | ICD-10-CM | POA: Diagnosis not present

## 2016-02-02 DIAGNOSIS — E1122 Type 2 diabetes mellitus with diabetic chronic kidney disease: Secondary | ICD-10-CM | POA: Diagnosis not present

## 2016-02-02 DIAGNOSIS — I5030 Unspecified diastolic (congestive) heart failure: Secondary | ICD-10-CM | POA: Diagnosis not present

## 2016-02-02 DIAGNOSIS — N184 Chronic kidney disease, stage 4 (severe): Secondary | ICD-10-CM | POA: Diagnosis not present

## 2016-02-02 DIAGNOSIS — I130 Hypertensive heart and chronic kidney disease with heart failure and stage 1 through stage 4 chronic kidney disease, or unspecified chronic kidney disease: Secondary | ICD-10-CM | POA: Diagnosis not present

## 2016-02-04 DIAGNOSIS — E11649 Type 2 diabetes mellitus with hypoglycemia without coma: Secondary | ICD-10-CM | POA: Diagnosis not present

## 2016-02-04 DIAGNOSIS — I130 Hypertensive heart and chronic kidney disease with heart failure and stage 1 through stage 4 chronic kidney disease, or unspecified chronic kidney disease: Secondary | ICD-10-CM | POA: Diagnosis not present

## 2016-02-04 DIAGNOSIS — N184 Chronic kidney disease, stage 4 (severe): Secondary | ICD-10-CM | POA: Diagnosis not present

## 2016-02-04 DIAGNOSIS — E1122 Type 2 diabetes mellitus with diabetic chronic kidney disease: Secondary | ICD-10-CM | POA: Diagnosis not present

## 2016-02-04 DIAGNOSIS — I5030 Unspecified diastolic (congestive) heart failure: Secondary | ICD-10-CM | POA: Diagnosis not present

## 2016-02-04 DIAGNOSIS — K604 Rectal fistula: Secondary | ICD-10-CM | POA: Diagnosis not present

## 2016-02-05 ENCOUNTER — Encounter (HOSPITAL_BASED_OUTPATIENT_CLINIC_OR_DEPARTMENT_OTHER): Payer: Medicare Other | Attending: Internal Medicine

## 2016-02-05 DIAGNOSIS — T8131XA Disruption of external operation (surgical) wound, not elsewhere classified, initial encounter: Secondary | ICD-10-CM | POA: Insufficient documentation

## 2016-02-05 DIAGNOSIS — Y838 Other surgical procedures as the cause of abnormal reaction of the patient, or of later complication, without mention of misadventure at the time of the procedure: Secondary | ICD-10-CM | POA: Diagnosis not present

## 2016-02-05 DIAGNOSIS — L98411 Non-pressure chronic ulcer of buttock limited to breakdown of skin: Secondary | ICD-10-CM | POA: Diagnosis not present

## 2016-02-05 DIAGNOSIS — E1121 Type 2 diabetes mellitus with diabetic nephropathy: Secondary | ICD-10-CM | POA: Diagnosis not present

## 2016-02-05 DIAGNOSIS — S31819A Unspecified open wound of right buttock, initial encounter: Secondary | ICD-10-CM | POA: Diagnosis not present

## 2016-02-05 DIAGNOSIS — S31809S Unspecified open wound of unspecified buttock, sequela: Secondary | ICD-10-CM | POA: Diagnosis not present

## 2016-02-05 DIAGNOSIS — L98421 Non-pressure chronic ulcer of back limited to breakdown of skin: Secondary | ICD-10-CM | POA: Diagnosis not present

## 2016-02-06 DIAGNOSIS — E11649 Type 2 diabetes mellitus with hypoglycemia without coma: Secondary | ICD-10-CM | POA: Diagnosis not present

## 2016-02-06 DIAGNOSIS — K604 Rectal fistula: Secondary | ICD-10-CM | POA: Diagnosis not present

## 2016-02-06 DIAGNOSIS — E1122 Type 2 diabetes mellitus with diabetic chronic kidney disease: Secondary | ICD-10-CM | POA: Diagnosis not present

## 2016-02-06 DIAGNOSIS — I5030 Unspecified diastolic (congestive) heart failure: Secondary | ICD-10-CM | POA: Diagnosis not present

## 2016-02-06 DIAGNOSIS — N184 Chronic kidney disease, stage 4 (severe): Secondary | ICD-10-CM | POA: Diagnosis not present

## 2016-02-06 DIAGNOSIS — I130 Hypertensive heart and chronic kidney disease with heart failure and stage 1 through stage 4 chronic kidney disease, or unspecified chronic kidney disease: Secondary | ICD-10-CM | POA: Diagnosis not present

## 2016-02-09 DIAGNOSIS — E11649 Type 2 diabetes mellitus with hypoglycemia without coma: Secondary | ICD-10-CM | POA: Diagnosis not present

## 2016-02-09 DIAGNOSIS — I130 Hypertensive heart and chronic kidney disease with heart failure and stage 1 through stage 4 chronic kidney disease, or unspecified chronic kidney disease: Secondary | ICD-10-CM | POA: Diagnosis not present

## 2016-02-09 DIAGNOSIS — N184 Chronic kidney disease, stage 4 (severe): Secondary | ICD-10-CM | POA: Diagnosis not present

## 2016-02-09 DIAGNOSIS — E1122 Type 2 diabetes mellitus with diabetic chronic kidney disease: Secondary | ICD-10-CM | POA: Diagnosis not present

## 2016-02-09 DIAGNOSIS — K604 Rectal fistula: Secondary | ICD-10-CM | POA: Diagnosis not present

## 2016-02-09 DIAGNOSIS — I5030 Unspecified diastolic (congestive) heart failure: Secondary | ICD-10-CM | POA: Diagnosis not present

## 2016-02-11 DIAGNOSIS — I5030 Unspecified diastolic (congestive) heart failure: Secondary | ICD-10-CM | POA: Diagnosis not present

## 2016-02-11 DIAGNOSIS — E11649 Type 2 diabetes mellitus with hypoglycemia without coma: Secondary | ICD-10-CM | POA: Diagnosis not present

## 2016-02-11 DIAGNOSIS — E1122 Type 2 diabetes mellitus with diabetic chronic kidney disease: Secondary | ICD-10-CM | POA: Diagnosis not present

## 2016-02-11 DIAGNOSIS — I130 Hypertensive heart and chronic kidney disease with heart failure and stage 1 through stage 4 chronic kidney disease, or unspecified chronic kidney disease: Secondary | ICD-10-CM | POA: Diagnosis not present

## 2016-02-11 DIAGNOSIS — K604 Rectal fistula: Secondary | ICD-10-CM | POA: Diagnosis not present

## 2016-02-11 DIAGNOSIS — N184 Chronic kidney disease, stage 4 (severe): Secondary | ICD-10-CM | POA: Diagnosis not present

## 2016-02-13 DIAGNOSIS — K604 Rectal fistula: Secondary | ICD-10-CM | POA: Diagnosis not present

## 2016-02-13 DIAGNOSIS — N184 Chronic kidney disease, stage 4 (severe): Secondary | ICD-10-CM | POA: Diagnosis not present

## 2016-02-13 DIAGNOSIS — I130 Hypertensive heart and chronic kidney disease with heart failure and stage 1 through stage 4 chronic kidney disease, or unspecified chronic kidney disease: Secondary | ICD-10-CM | POA: Diagnosis not present

## 2016-02-13 DIAGNOSIS — E11649 Type 2 diabetes mellitus with hypoglycemia without coma: Secondary | ICD-10-CM | POA: Diagnosis not present

## 2016-02-13 DIAGNOSIS — E1122 Type 2 diabetes mellitus with diabetic chronic kidney disease: Secondary | ICD-10-CM | POA: Diagnosis not present

## 2016-02-13 DIAGNOSIS — I5030 Unspecified diastolic (congestive) heart failure: Secondary | ICD-10-CM | POA: Diagnosis not present

## 2016-02-16 DIAGNOSIS — I130 Hypertensive heart and chronic kidney disease with heart failure and stage 1 through stage 4 chronic kidney disease, or unspecified chronic kidney disease: Secondary | ICD-10-CM | POA: Diagnosis not present

## 2016-02-16 DIAGNOSIS — N184 Chronic kidney disease, stage 4 (severe): Secondary | ICD-10-CM | POA: Diagnosis not present

## 2016-02-16 DIAGNOSIS — I5030 Unspecified diastolic (congestive) heart failure: Secondary | ICD-10-CM | POA: Diagnosis not present

## 2016-02-16 DIAGNOSIS — E1122 Type 2 diabetes mellitus with diabetic chronic kidney disease: Secondary | ICD-10-CM | POA: Diagnosis not present

## 2016-02-16 DIAGNOSIS — K604 Rectal fistula: Secondary | ICD-10-CM | POA: Diagnosis not present

## 2016-02-16 DIAGNOSIS — E11649 Type 2 diabetes mellitus with hypoglycemia without coma: Secondary | ICD-10-CM | POA: Diagnosis not present

## 2016-02-18 DIAGNOSIS — E1122 Type 2 diabetes mellitus with diabetic chronic kidney disease: Secondary | ICD-10-CM | POA: Diagnosis not present

## 2016-02-18 DIAGNOSIS — I130 Hypertensive heart and chronic kidney disease with heart failure and stage 1 through stage 4 chronic kidney disease, or unspecified chronic kidney disease: Secondary | ICD-10-CM | POA: Diagnosis not present

## 2016-02-18 DIAGNOSIS — I5030 Unspecified diastolic (congestive) heart failure: Secondary | ICD-10-CM | POA: Diagnosis not present

## 2016-02-18 DIAGNOSIS — E11649 Type 2 diabetes mellitus with hypoglycemia without coma: Secondary | ICD-10-CM | POA: Diagnosis not present

## 2016-02-18 DIAGNOSIS — K604 Rectal fistula: Secondary | ICD-10-CM | POA: Diagnosis not present

## 2016-02-18 DIAGNOSIS — N184 Chronic kidney disease, stage 4 (severe): Secondary | ICD-10-CM | POA: Diagnosis not present

## 2016-02-19 DIAGNOSIS — E1121 Type 2 diabetes mellitus with diabetic nephropathy: Secondary | ICD-10-CM | POA: Diagnosis not present

## 2016-02-19 DIAGNOSIS — T8183XA Persistent postprocedural fistula, initial encounter: Secondary | ICD-10-CM | POA: Diagnosis not present

## 2016-02-19 DIAGNOSIS — T8131XA Disruption of external operation (surgical) wound, not elsewhere classified, initial encounter: Secondary | ICD-10-CM | POA: Diagnosis not present

## 2016-02-19 DIAGNOSIS — L03317 Cellulitis of buttock: Secondary | ICD-10-CM | POA: Diagnosis not present

## 2016-02-19 DIAGNOSIS — S31809S Unspecified open wound of unspecified buttock, sequela: Secondary | ICD-10-CM | POA: Diagnosis not present

## 2016-02-19 DIAGNOSIS — E1321 Other specified diabetes mellitus with diabetic nephropathy: Secondary | ICD-10-CM | POA: Diagnosis not present

## 2016-02-20 DIAGNOSIS — N184 Chronic kidney disease, stage 4 (severe): Secondary | ICD-10-CM | POA: Diagnosis not present

## 2016-02-20 DIAGNOSIS — I5030 Unspecified diastolic (congestive) heart failure: Secondary | ICD-10-CM | POA: Diagnosis not present

## 2016-02-20 DIAGNOSIS — E11649 Type 2 diabetes mellitus with hypoglycemia without coma: Secondary | ICD-10-CM | POA: Diagnosis not present

## 2016-02-20 DIAGNOSIS — E1122 Type 2 diabetes mellitus with diabetic chronic kidney disease: Secondary | ICD-10-CM | POA: Diagnosis not present

## 2016-02-20 DIAGNOSIS — K604 Rectal fistula: Secondary | ICD-10-CM | POA: Diagnosis not present

## 2016-02-20 DIAGNOSIS — I130 Hypertensive heart and chronic kidney disease with heart failure and stage 1 through stage 4 chronic kidney disease, or unspecified chronic kidney disease: Secondary | ICD-10-CM | POA: Diagnosis not present

## 2016-02-23 DIAGNOSIS — E1122 Type 2 diabetes mellitus with diabetic chronic kidney disease: Secondary | ICD-10-CM | POA: Diagnosis not present

## 2016-02-23 DIAGNOSIS — I5030 Unspecified diastolic (congestive) heart failure: Secondary | ICD-10-CM | POA: Diagnosis not present

## 2016-02-23 DIAGNOSIS — E11649 Type 2 diabetes mellitus with hypoglycemia without coma: Secondary | ICD-10-CM | POA: Diagnosis not present

## 2016-02-23 DIAGNOSIS — K604 Rectal fistula: Secondary | ICD-10-CM | POA: Diagnosis not present

## 2016-02-23 DIAGNOSIS — N184 Chronic kidney disease, stage 4 (severe): Secondary | ICD-10-CM | POA: Diagnosis not present

## 2016-02-23 DIAGNOSIS — I130 Hypertensive heart and chronic kidney disease with heart failure and stage 1 through stage 4 chronic kidney disease, or unspecified chronic kidney disease: Secondary | ICD-10-CM | POA: Diagnosis not present

## 2016-02-25 DIAGNOSIS — I130 Hypertensive heart and chronic kidney disease with heart failure and stage 1 through stage 4 chronic kidney disease, or unspecified chronic kidney disease: Secondary | ICD-10-CM | POA: Diagnosis not present

## 2016-02-25 DIAGNOSIS — E1122 Type 2 diabetes mellitus with diabetic chronic kidney disease: Secondary | ICD-10-CM | POA: Diagnosis not present

## 2016-02-25 DIAGNOSIS — I5030 Unspecified diastolic (congestive) heart failure: Secondary | ICD-10-CM | POA: Diagnosis not present

## 2016-02-25 DIAGNOSIS — N184 Chronic kidney disease, stage 4 (severe): Secondary | ICD-10-CM | POA: Diagnosis not present

## 2016-02-25 DIAGNOSIS — K604 Rectal fistula: Secondary | ICD-10-CM | POA: Diagnosis not present

## 2016-02-25 DIAGNOSIS — E11649 Type 2 diabetes mellitus with hypoglycemia without coma: Secondary | ICD-10-CM | POA: Diagnosis not present

## 2016-02-26 ENCOUNTER — Other Ambulatory Visit: Payer: Self-pay | Admitting: Nurse Practitioner

## 2016-02-26 ENCOUNTER — Other Ambulatory Visit: Payer: Self-pay | Admitting: Pediatrics

## 2016-02-26 DIAGNOSIS — E034 Atrophy of thyroid (acquired): Secondary | ICD-10-CM

## 2016-02-26 DIAGNOSIS — E785 Hyperlipidemia, unspecified: Secondary | ICD-10-CM

## 2016-02-26 DIAGNOSIS — I10 Essential (primary) hypertension: Secondary | ICD-10-CM

## 2016-02-27 DIAGNOSIS — E11649 Type 2 diabetes mellitus with hypoglycemia without coma: Secondary | ICD-10-CM | POA: Diagnosis not present

## 2016-02-27 DIAGNOSIS — E1122 Type 2 diabetes mellitus with diabetic chronic kidney disease: Secondary | ICD-10-CM | POA: Diagnosis not present

## 2016-02-27 DIAGNOSIS — N184 Chronic kidney disease, stage 4 (severe): Secondary | ICD-10-CM | POA: Diagnosis not present

## 2016-02-27 DIAGNOSIS — I130 Hypertensive heart and chronic kidney disease with heart failure and stage 1 through stage 4 chronic kidney disease, or unspecified chronic kidney disease: Secondary | ICD-10-CM | POA: Diagnosis not present

## 2016-02-27 DIAGNOSIS — I5030 Unspecified diastolic (congestive) heart failure: Secondary | ICD-10-CM | POA: Diagnosis not present

## 2016-02-27 DIAGNOSIS — K604 Rectal fistula: Secondary | ICD-10-CM | POA: Diagnosis not present

## 2016-02-27 NOTE — Telephone Encounter (Signed)
Needs to come for clinic visit, MUST bring all current medication bottles before refills sent. Follows with endocrine and nephrology as well and many of requested refills are medications that they usually prescribe. I dont want her to run out but I also dont want to continue medications that a different doctor discontinued. Needs appt ASAP.

## 2016-02-27 NOTE — Telephone Encounter (Signed)
Pt aware & has appt scheduled for 10/13

## 2016-02-27 NOTE — Telephone Encounter (Signed)
Pt aware & has an appt schedule for 10/13

## 2016-03-01 DIAGNOSIS — E1122 Type 2 diabetes mellitus with diabetic chronic kidney disease: Secondary | ICD-10-CM | POA: Diagnosis not present

## 2016-03-01 DIAGNOSIS — I130 Hypertensive heart and chronic kidney disease with heart failure and stage 1 through stage 4 chronic kidney disease, or unspecified chronic kidney disease: Secondary | ICD-10-CM | POA: Diagnosis not present

## 2016-03-01 DIAGNOSIS — K604 Rectal fistula: Secondary | ICD-10-CM | POA: Diagnosis not present

## 2016-03-01 DIAGNOSIS — E11649 Type 2 diabetes mellitus with hypoglycemia without coma: Secondary | ICD-10-CM | POA: Diagnosis not present

## 2016-03-01 DIAGNOSIS — I5030 Unspecified diastolic (congestive) heart failure: Secondary | ICD-10-CM | POA: Diagnosis not present

## 2016-03-01 DIAGNOSIS — N184 Chronic kidney disease, stage 4 (severe): Secondary | ICD-10-CM | POA: Diagnosis not present

## 2016-03-02 ENCOUNTER — Other Ambulatory Visit: Payer: Self-pay | Admitting: "Endocrinology

## 2016-03-03 DIAGNOSIS — I5030 Unspecified diastolic (congestive) heart failure: Secondary | ICD-10-CM | POA: Diagnosis not present

## 2016-03-03 DIAGNOSIS — N184 Chronic kidney disease, stage 4 (severe): Secondary | ICD-10-CM | POA: Diagnosis not present

## 2016-03-03 DIAGNOSIS — E1122 Type 2 diabetes mellitus with diabetic chronic kidney disease: Secondary | ICD-10-CM | POA: Diagnosis not present

## 2016-03-03 DIAGNOSIS — I130 Hypertensive heart and chronic kidney disease with heart failure and stage 1 through stage 4 chronic kidney disease, or unspecified chronic kidney disease: Secondary | ICD-10-CM | POA: Diagnosis not present

## 2016-03-03 DIAGNOSIS — E11649 Type 2 diabetes mellitus with hypoglycemia without coma: Secondary | ICD-10-CM | POA: Diagnosis not present

## 2016-03-03 DIAGNOSIS — K604 Rectal fistula: Secondary | ICD-10-CM | POA: Diagnosis not present

## 2016-03-04 ENCOUNTER — Encounter (HOSPITAL_BASED_OUTPATIENT_CLINIC_OR_DEPARTMENT_OTHER): Payer: Medicare Other | Attending: Internal Medicine

## 2016-03-04 ENCOUNTER — Ambulatory Visit: Payer: Medicare Other | Admitting: Pediatrics

## 2016-03-04 DIAGNOSIS — S31819A Unspecified open wound of right buttock, initial encounter: Secondary | ICD-10-CM | POA: Insufficient documentation

## 2016-03-04 DIAGNOSIS — T8131XA Disruption of external operation (surgical) wound, not elsewhere classified, initial encounter: Secondary | ICD-10-CM | POA: Diagnosis not present

## 2016-03-04 DIAGNOSIS — Y838 Other surgical procedures as the cause of abnormal reaction of the patient, or of later complication, without mention of misadventure at the time of the procedure: Secondary | ICD-10-CM | POA: Diagnosis not present

## 2016-03-04 DIAGNOSIS — L0231 Cutaneous abscess of buttock: Secondary | ICD-10-CM | POA: Diagnosis not present

## 2016-03-04 DIAGNOSIS — X58XXXA Exposure to other specified factors, initial encounter: Secondary | ICD-10-CM | POA: Insufficient documentation

## 2016-03-04 DIAGNOSIS — T8189XA Other complications of procedures, not elsewhere classified, initial encounter: Secondary | ICD-10-CM | POA: Diagnosis not present

## 2016-03-05 ENCOUNTER — Encounter: Payer: Self-pay | Admitting: Pediatrics

## 2016-03-05 ENCOUNTER — Ambulatory Visit (INDEPENDENT_AMBULATORY_CARE_PROVIDER_SITE_OTHER): Payer: Medicare Other | Admitting: Pediatrics

## 2016-03-05 VITALS — BP 132/69 | HR 66 | Temp 96.9°F | Ht 62.0 in | Wt 162.0 lb

## 2016-03-05 DIAGNOSIS — E1122 Type 2 diabetes mellitus with diabetic chronic kidney disease: Secondary | ICD-10-CM

## 2016-03-05 DIAGNOSIS — I10 Essential (primary) hypertension: Secondary | ICD-10-CM | POA: Diagnosis not present

## 2016-03-05 DIAGNOSIS — Z23 Encounter for immunization: Secondary | ICD-10-CM

## 2016-03-05 DIAGNOSIS — I5030 Unspecified diastolic (congestive) heart failure: Secondary | ICD-10-CM | POA: Diagnosis not present

## 2016-03-05 DIAGNOSIS — E785 Hyperlipidemia, unspecified: Secondary | ICD-10-CM | POA: Diagnosis not present

## 2016-03-05 DIAGNOSIS — Z794 Long term (current) use of insulin: Secondary | ICD-10-CM

## 2016-03-05 DIAGNOSIS — N184 Chronic kidney disease, stage 4 (severe): Secondary | ICD-10-CM | POA: Diagnosis not present

## 2016-03-05 DIAGNOSIS — K219 Gastro-esophageal reflux disease without esophagitis: Secondary | ICD-10-CM | POA: Diagnosis not present

## 2016-03-05 DIAGNOSIS — K604 Rectal fistula: Secondary | ICD-10-CM | POA: Diagnosis not present

## 2016-03-05 DIAGNOSIS — E11649 Type 2 diabetes mellitus with hypoglycemia without coma: Secondary | ICD-10-CM | POA: Diagnosis not present

## 2016-03-05 DIAGNOSIS — E034 Atrophy of thyroid (acquired): Secondary | ICD-10-CM

## 2016-03-05 DIAGNOSIS — E875 Hyperkalemia: Secondary | ICD-10-CM

## 2016-03-05 DIAGNOSIS — I130 Hypertensive heart and chronic kidney disease with heart failure and stage 1 through stage 4 chronic kidney disease, or unspecified chronic kidney disease: Secondary | ICD-10-CM | POA: Diagnosis not present

## 2016-03-05 LAB — BAYER DCA HB A1C WAIVED: HB A1C: 7.3 % — AB (ref <7.0)

## 2016-03-05 MED ORDER — PANTOPRAZOLE SODIUM 20 MG PO TBEC: 20.0000 mg | DELAYED_RELEASE_TABLET | Freq: Every day | ORAL | 1 refills | Status: DC

## 2016-03-05 MED ORDER — METOPROLOL SUCCINATE ER 50 MG PO TB24: 50.0000 mg | ORAL_TABLET | Freq: Every morning | ORAL | 1 refills | Status: DC

## 2016-03-05 MED ORDER — ATORVASTATIN CALCIUM 40 MG PO TABS: 40.0000 mg | ORAL_TABLET | Freq: Every evening | ORAL | 1 refills | Status: DC

## 2016-03-05 MED ORDER — AMLODIPINE BESYLATE 5 MG PO TABS: 5.0000 mg | ORAL_TABLET | Freq: Every day | ORAL | 1 refills | Status: DC

## 2016-03-05 MED ORDER — LOSARTAN POTASSIUM 100 MG PO TABS: 100.0000 mg | ORAL_TABLET | Freq: Every day | ORAL | 1 refills | Status: DC

## 2016-03-05 MED ORDER — LEVOTHYROXINE SODIUM 75 MCG PO TABS: 75.0000 ug | ORAL_TABLET | Freq: Every day | ORAL | 1 refills | Status: DC

## 2016-03-05 NOTE — Progress Notes (Signed)
  Subjective:   Patient ID: [name], female    DOB: 10-17-1945, 70 y.o.   MRN: [id number] CC: Follow-up multiple med problems  HPI: [name] is a 70 y.o. female presenting for Follow-up  CKD: Followed by nephrology Next appt in 1 month  DM2: Takes 8 units of 70/30 once a day No recent lows Has f/u with endo in 1 month  HTN: Home health nurse has been coming to see her three times a week, says her BP at home has been normal Is always elevated when she first comes to doctors office  Hypothyroidism: Takes meds regularly No symptoms  GER: Takes pantoprazole daily Helps with symptoms  Relevant past medical, surgical, family and social history reviewed. Allergies and medications reviewed and updated. History  Smoking Status  . Never Smoker  Smokeless Tobacco  . Never Used   ROS: Per HPI   Objective:    BP 132/69   Pulse 66   Temp (!) 96.9 F (36.1 C) (Oral)   Ht _0  (1.575 m)   Wt 162 lb (73.5 kg)   BMI 29.63 kg/m   Wt Readings from Last 3 Encounters:  03/05/16 162 lb (73.5 kg)  01/20/16 168 lb (76.2 kg)  12/08/15 160 lb (72.6 kg)    Gen: NAD, alert, cooperative with exam, NCAT EYES: EOMI, no conjunctival injection, or no icterus ENT:  OP without erythema LYMPH: no cervical LAD CV: NRRR, normal S1/S2, no murmur, distal pulses 2+ b/l Resp: CTABL, no wheezes, normal WOB Abd: +BS, soft, NTND.  Ext: No edema, warm Neuro: Alert and oriented MSK: normal muscle bulk  Assessment & Plan:  [name] was seen today for follow-up.  Diagnoses and all orders for this visit:  Type 2 diabetes mellitus with stage 4 chronic kidney disease, with long-term current use of insulin ([facility]) Last a1c 7.3 Follows with Dr. Dorris Fetch Wants to have labs drawn prior to upcoming appt Taking insulin regularly, no lows -     Bayer DCA Hb A1c Waived  Essential hypertension BP always elevated initially in office per pt, improved today to fair control with recheck HTN managed  by nephrology -     metoprolol succinate (TOPROL-XL) 50 MG 24 hr tablet; Take 1 tablet (50 mg total) by mouth every morning. -     losartan (COZAAR) 100 MG tablet; Take 1 tablet (100 mg total) by mouth daily. -     amLODipine (NORVASC) 5 MG tablet; Take 1 tablet (5 mg total) by mouth daily. -     BMP8+EGFR  Hypothyroidism due to acquired atrophy of thyroid No symptoms -     levothyroxine (SYNTHROID, LEVOTHROID) 75 MCG tablet; Take 1 tablet (75 mcg total) by mouth daily before breakfast. -     TSH  Hyperlipidemia with target LDL less than 100 -     atorvastatin (LIPITOR) 40 MG tablet; Take 1 tablet (40 mg total) by mouth every evening. -     Lipid panel  Gastroesophageal reflux disease, esophagitis presence not specified No symptoms as long as she continues pantoprazole -     pantoprazole (PROTONIX) 20 MG tablet; Take 1 tablet (20 mg total) by mouth daily.  Encounter for immunization -     Flu vaccine HIGH DOSE PF   Follow up plan: Return in about 3 months (around 06/05/2016). Assunta Found, MD [place]

## 2016-03-06 DIAGNOSIS — E11649 Type 2 diabetes mellitus with hypoglycemia without coma: Secondary | ICD-10-CM | POA: Diagnosis not present

## 2016-03-06 DIAGNOSIS — N184 Chronic kidney disease, stage 4 (severe): Secondary | ICD-10-CM | POA: Diagnosis not present

## 2016-03-06 DIAGNOSIS — I5030 Unspecified diastolic (congestive) heart failure: Secondary | ICD-10-CM | POA: Diagnosis not present

## 2016-03-06 DIAGNOSIS — K604 Rectal fistula: Secondary | ICD-10-CM | POA: Diagnosis not present

## 2016-03-06 DIAGNOSIS — E1122 Type 2 diabetes mellitus with diabetic chronic kidney disease: Secondary | ICD-10-CM | POA: Diagnosis not present

## 2016-03-06 DIAGNOSIS — I130 Hypertensive heart and chronic kidney disease with heart failure and stage 1 through stage 4 chronic kidney disease, or unspecified chronic kidney disease: Secondary | ICD-10-CM | POA: Diagnosis not present

## 2016-03-06 LAB — BMP8+EGFR
BUN / CREAT RATIO: 22 (ref 12–28)
BUN: 32 mg/dL — ABNORMAL HIGH (ref 8–27)
CO2: 16 mmol/L — AB (ref 18–29)
CREATININE: 1.43 mg/dL — AB (ref 0.57–1.00)
Calcium: 9.5 mg/dL (ref 8.7–10.3)
Chloride: 107 mmol/L — ABNORMAL HIGH (ref 96–106)
GFR calc Af Amer: 43 mL/min/{1.73_m2} — ABNORMAL LOW (ref >59)
GFR, EST NON AFRICAN AMERICAN: 37 mL/min/{1.73_m2} — AB (ref >59)
GLUCOSE: 170 mg/dL — AB (ref 65–99)
POTASSIUM: 5.8 mmol/L — AB (ref 3.5–5.2)
SODIUM: 141 mmol/L (ref 134–144)

## 2016-03-06 LAB — LIPID PANEL
CHOL/HDL RATIO: 4.8 ratio — AB (ref 0.0–4.4)
Cholesterol, Total: 159 mg/dL (ref 100–199)
HDL: 33 mg/dL — ABNORMAL LOW (ref >39)
LDL CALC: 95 mg/dL (ref 0–99)
TRIGLYCERIDES: 157 mg/dL — AB (ref 0–149)
VLDL Cholesterol Cal: 31 mg/dL (ref 5–40)

## 2016-03-06 LAB — TSH: TSH: 3.29 u[IU]/mL (ref 0.450–4.500)

## 2016-03-08 ENCOUNTER — Ambulatory Visit: Payer: Medicare Other | Admitting: Pediatrics

## 2016-03-08 DIAGNOSIS — E11649 Type 2 diabetes mellitus with hypoglycemia without coma: Secondary | ICD-10-CM | POA: Diagnosis not present

## 2016-03-08 DIAGNOSIS — I5030 Unspecified diastolic (congestive) heart failure: Secondary | ICD-10-CM | POA: Diagnosis not present

## 2016-03-08 DIAGNOSIS — N184 Chronic kidney disease, stage 4 (severe): Secondary | ICD-10-CM | POA: Diagnosis not present

## 2016-03-08 DIAGNOSIS — K604 Rectal fistula: Secondary | ICD-10-CM | POA: Diagnosis not present

## 2016-03-08 DIAGNOSIS — E1122 Type 2 diabetes mellitus with diabetic chronic kidney disease: Secondary | ICD-10-CM | POA: Diagnosis not present

## 2016-03-08 DIAGNOSIS — I130 Hypertensive heart and chronic kidney disease with heart failure and stage 1 through stage 4 chronic kidney disease, or unspecified chronic kidney disease: Secondary | ICD-10-CM | POA: Diagnosis not present

## 2016-03-09 NOTE — Addendum Note (Signed)
Addended by: Eustaquio Maize on: 03/09/2016 11:24 AM   Modules accepted: Orders

## 2016-03-10 DIAGNOSIS — K604 Rectal fistula: Secondary | ICD-10-CM | POA: Diagnosis not present

## 2016-03-10 DIAGNOSIS — E11649 Type 2 diabetes mellitus with hypoglycemia without coma: Secondary | ICD-10-CM | POA: Diagnosis not present

## 2016-03-10 DIAGNOSIS — E1122 Type 2 diabetes mellitus with diabetic chronic kidney disease: Secondary | ICD-10-CM | POA: Diagnosis not present

## 2016-03-10 DIAGNOSIS — I5030 Unspecified diastolic (congestive) heart failure: Secondary | ICD-10-CM | POA: Diagnosis not present

## 2016-03-10 DIAGNOSIS — I130 Hypertensive heart and chronic kidney disease with heart failure and stage 1 through stage 4 chronic kidney disease, or unspecified chronic kidney disease: Secondary | ICD-10-CM | POA: Diagnosis not present

## 2016-03-10 DIAGNOSIS — N184 Chronic kidney disease, stage 4 (severe): Secondary | ICD-10-CM | POA: Diagnosis not present

## 2016-03-11 ENCOUNTER — Encounter: Payer: Self-pay | Admitting: "Endocrinology

## 2016-03-11 ENCOUNTER — Other Ambulatory Visit (INDEPENDENT_AMBULATORY_CARE_PROVIDER_SITE_OTHER): Payer: Medicare Other

## 2016-03-11 ENCOUNTER — Ambulatory Visit (INDEPENDENT_AMBULATORY_CARE_PROVIDER_SITE_OTHER): Payer: Medicare Other | Admitting: "Endocrinology

## 2016-03-11 VITALS — BP 123/64 | HR 80 | Ht 62.0 in | Wt 161.0 lb

## 2016-03-11 DIAGNOSIS — E875 Hyperkalemia: Secondary | ICD-10-CM

## 2016-03-11 DIAGNOSIS — Z794 Long term (current) use of insulin: Secondary | ICD-10-CM | POA: Diagnosis not present

## 2016-03-11 DIAGNOSIS — I130 Hypertensive heart and chronic kidney disease with heart failure and stage 1 through stage 4 chronic kidney disease, or unspecified chronic kidney disease: Secondary | ICD-10-CM | POA: Diagnosis not present

## 2016-03-11 DIAGNOSIS — E1122 Type 2 diabetes mellitus with diabetic chronic kidney disease: Secondary | ICD-10-CM

## 2016-03-11 DIAGNOSIS — E11649 Type 2 diabetes mellitus with hypoglycemia without coma: Secondary | ICD-10-CM | POA: Diagnosis not present

## 2016-03-11 DIAGNOSIS — K604 Rectal fistula: Secondary | ICD-10-CM | POA: Diagnosis not present

## 2016-03-11 DIAGNOSIS — N184 Chronic kidney disease, stage 4 (severe): Secondary | ICD-10-CM | POA: Diagnosis not present

## 2016-03-11 DIAGNOSIS — I10 Essential (primary) hypertension: Secondary | ICD-10-CM

## 2016-03-11 DIAGNOSIS — E782 Mixed hyperlipidemia: Secondary | ICD-10-CM | POA: Diagnosis not present

## 2016-03-11 DIAGNOSIS — I5030 Unspecified diastolic (congestive) heart failure: Secondary | ICD-10-CM | POA: Diagnosis not present

## 2016-03-11 NOTE — Progress Notes (Signed)
Subjective:    Patient ID: [name], female    DOB: 03-20-1946. Patient is to follow-up for uncontrolled type 2 diabetes with her meter and logs.  Past Medical History:  Diagnosis Date  . Anemia in chronic renal disease    Aranesp injection --  when Hg <11, last injection 12-24-14.  Marland Kitchen Anxiety   . Arthritis    knees and hand/fingers. "broke back"-being evaluated for this"weakness left leg"  . CKD (chronic kidney disease), stage III    nephrologist--  dr Mercy Moore  . Complication of anesthesia    post-op confusion   . Constipation   . Coronary artery disease   . Diabetic gastroparesis ([place])   . Diabetic retinopathy ([place])    bilateral --  monitored by dr [name]  . Diverticulosis of colon   . GAD (generalized anxiety disorder)   . GERD (gastroesophageal reflux disease)   . History of colon polyps    benign  . History of esophagitis   . History of GI bleed    upper 2009  due to esophagitis  &  2002  due to Mallory-Weiss tear  . History of gout    in issues for several years  . 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 History of rectal abscess    12-29-2004  bedside I & D  . Hyperlipidemia   . Hypertension   . Hypothyroidism   . LAFB (left anterior fascicular block)   . Right bundle branch block   . Sacral decubitus ulcer    since 2014- 01-02-15 remains with wound" gauze dressing changes daily.  . Type II diabetes mellitus ([place])    Past Surgical History:  Procedure Laterality Date  . APPLICATION OF A-CELL OF EXTREMITY N/A 04/07/2015   Procedure: A CELL PLACMENT;  Surgeon: Loel Lofty Dillingham, DO;  Location: [facility];  Service: Plastics;  Laterality: N/A;  . CARDIOVASCULAR STRESS TEST  12-30-2004   normal perfusion study/  no ischemia or infartion/  normal LV wall function and wall motion , ef 66%  . CATARACT EXTRACTION W/ INTRAOCULAR LENS  IMPLANT, BILATERAL  1995  . COLONOSCOPY W/ POLYPECTOMY  last one 2008  . COMPRESSION HIP SCREW Right  05/18/2014   Procedure: COMPRESSION HIP;  Surgeon: Carole Civil, MD;  Location: [facility];  Service: Orthopedics;  Laterality: Right;  . ESOPHAGOGASTRODUODENOSCOPY  last one 01-09-2011  . EVALUATION UNDER ANESTHESIA WITH FISTULECTOMY N/A 01/06/2015   Procedure: EXAM UNDER ANESTHESIA , placement of seton;  Surgeon: Jackolyn Confer, MD;  Location: [facility];  Service: General;  Laterality: N/A;  . I&D EXTREMITY N/A 04/07/2015   Procedure: IRRIGATION AND DEBRIDEMENT ISCHIAL ULCER;  Surgeon: Loel Lofty Dillingham, DO;  Location: [facility];  Service: Plastics;  Laterality: N/A;  . INCISION AND DRAINAGE OF WOUND N/A 09/30/2014   Procedure: IRRIGATION AND DEBRIDEMENT SACRAL WOUND, EXCISION OF PERIRECTAL TRACT WITH PLACEMENT OF ACCELL;  Surgeon: Theodoro Kos, DO;  Location: [facility];  Service: Plastics;  Laterality: N/A;  . ORIF FEMUR FRACTURE Left 10/09/2012   Procedure: OPEN REDUCTION INTERNAL FIXATION (ORIF) DISTAL FEMUR FRACTURE;  Surgeon: Rozanna Box, MD;  Location: [facility];  Service: Orthopedics;  Laterality: Left;  . RETINOPATHY SURGERY Bilateral 1980's?  . TRANSTHORACIC ECHOCARDIOGRAM  02-18-2011   mild LVH,  ef 55-60%,  grade I diastolic dysfunction/  mild TR/  RV systolic pressure increased consistant with moderate pulmonary hypertension   Social History   Social History  .  Marital status: Married    Spouse name: [name]  . Number of children: 0  . Years of education: 12   Occupational History  . Retired Retired   Social History Main Topics  . Smoking status: Never Smoker  . Smokeless tobacco: Never Used  . Alcohol use No  . Drug use: No  . Sexual activity: Not Asked   Other Topics Concern  . None   Social History Narrative   Lives with husband.    Caffeine use: none   Outpatient Encounter Prescriptions as of 03/11/2016  Medication Sig  . amLODipine (NORVASC) 5 MG tablet Take 1 tablet (5 mg total) by mouth daily.  Marland Kitchen atorvastatin (LIPITOR) 40 MG tablet Take 1 tablet  (40 mg total) by mouth every evening.  . Blood Glucose Monitoring Suppl (ACCU-CHEK AVIVA PLUS) w/Device KIT Use as directed 4 times daily  . glucose blood (ACCU-CHEK AVIVA) test strip Use as instructed 4 x daily. E11.65  . insulin aspart protamine - aspart (NOVOLOG MIX 70/30 FLEXPEN) (70-30) 100 UNIT/ML FlexPen Inject 0.08 mLs (8 Units total) into the skin daily with breakfast. Only if blood glucose is above 90  . Lancets (ACCU-CHEK SOFT TOUCH) lancets Use as instructed 4 x daily, E11.65  . levothyroxine (SYNTHROID, LEVOTHROID) 75 MCG tablet Take 1 tablet (75 mcg total) by mouth daily before breakfast.  . losartan (COZAAR) 100 MG tablet Take 1 tablet (100 mg total) by mouth daily.  . metoprolol succinate (TOPROL-XL) 50 MG 24 hr tablet Take 1 tablet (50 mg total) by mouth every morning.  Marland Kitchen NOVOLOG MIX 70/30 FLEXPEN (70-30) 100 UNIT/ML FlexPen INJECT 15 UNITS (0.15ML) TWICE DAILY  . pantoprazole (PROTONIX) 20 MG tablet Take 1 tablet (20 mg total) by mouth daily.  Marland Kitchen UNIFINE PENTIPS 32G X 4 MM MISC    No facility-administered encounter medications on file as of 03/11/2016.    ALLERGIES: Allergies  Allergen Reactions  . Aspirin Nausea And Vomiting  . Ciprofloxacin Other (See Comments)    Upset Stomach  . Codeine Nausea And Vomiting    Makes me sick   . Micardis [Telmisartan] Other (See Comments)    unknown  . Nexium [Esomeprazole Magnesium] Other (See Comments)    Causes internal bleeding  . Niaspan [Niacin Er] Other (See Comments)    Reaction is unknown  . Onglyza [Saxagliptin] Other (See Comments)    Reaction is unknown  . Other     No otc pain medications  . Rofecoxib Other (See Comments)    unknown  . Simvastatin   . Tequin [Gatifloxacin] Other (See Comments)    Reaction is unknown  . Welchol [Colesevelam Hcl]   . Amoxicillin Rash   VACCINATION STATUS: Immunization History  Administered Date(s) Administered  . Influenza, High Dose Seasonal PF 03/05/2016  .  Influenza,inj,Quad PF,36+ Mos 02/16/2013, 02/27/2015  . Pneumococcal Conjugate-13 10/11/2014  . Pneumococcal Polysaccharide-23 10/10/2012  . Tdap 10/06/2012    Diabetes  She presents for her follow-up diabetic visit. She has type 2 diabetes mellitus. Onset time: She was diagnosed at approximate age of 89 years . Her disease course has been improving. There are no hypoglycemic associated symptoms. Pertinent negatives for hypoglycemia include no confusion, headaches, pallor or seizures. Associated symptoms include fatigue and visual change. Pertinent negatives for diabetes include no chest pain, no polydipsia and no polyphagia. There are no hypoglycemic complications. Symptoms are improving. Diabetic complications include nephropathy, peripheral neuropathy, PVD and retinopathy. Risk factors for coronary artery disease include diabetes mellitus, dyslipidemia, hypertension and  sedentary lifestyle. Current diabetic treatment includes oral agent (monotherapy). She is compliant with treatment most of the time. Weight trend: Wheelchair-bound. She is following a generally unhealthy diet. When asked about meal planning, she reported none. She has not had a previous visit with a dietitian. She never participates in exercise. Her home blood glucose trend is increasing steadily (Her glucose profile today is acceptable between 100- 200 mg per DL average.). Her breakfast blood glucose range is generally 110-130 mg/dl. Her dinner blood glucose range is generally 180-200 mg/dl. Her overall blood glucose range is 140-180 mg/dl. An ACE inhibitor/angiotensin II receptor blocker is being taken. Eye exam is current.  Thyroid Problem  Presents for initial visit. Onset time: 15 years. Symptoms include fatigue and visual change. Patient reports no cold intolerance, diarrhea, heat intolerance or palpitations. The symptoms have been stable. Past treatments include levothyroxine. The following procedures have not been performed:  thyroidectomy.  Hypertension  This is a chronic problem. The current episode started more than 1 year ago. Pertinent negatives include no chest pain, headaches, palpitations or shortness of breath. Past treatments include angiotensin blockers. Hypertensive end-organ damage includes PVD, retinopathy and a thyroid problem.      Review of Systems  Constitutional: Positive for fatigue. Negative for unexpected weight change.  HENT: Negative for trouble swallowing and voice change.   Eyes: Negative for visual disturbance.  Respiratory: Negative for cough, shortness of breath and wheezing.   Cardiovascular: Negative for chest pain, palpitations and leg swelling.  Gastrointestinal: Negative for diarrhea, nausea and vomiting.  Endocrine: Negative for cold intolerance, heat intolerance, polydipsia and polyphagia.  Musculoskeletal: Positive for arthralgias, back pain, gait problem, joint swelling and myalgias.       She is wheelchair-bound due to recent hip fracture and diffuse debilitating arthritis.  Skin: Negative for color change, pallor, rash and wound.  Neurological: Negative for seizures and headaches.  Psychiatric/Behavioral: Negative for confusion and suicidal ideas.    Objective:    BP 123/64   Pulse 80   Ht '5\' 2"'$  (1.575 m)   Wt 161 lb (73 kg)   BMI 29.45 kg/m   Wt Readings from Last 3 Encounters:  03/11/16 161 lb (73 kg)  03/05/16 162 lb (73.5 kg)  01/20/16 168 lb (76.2 kg)    Physical Exam  Constitutional: She is oriented to person, place, and time. She appears well-developed.  HENT:  Head: Normocephalic and atraumatic.  Eyes: EOM are normal.  Neck: Normal range of motion. Neck supple. No tracheal deviation present. No thyromegaly present.  Cardiovascular: Normal rate and regular rhythm.  Exam reveals decreased pulses.   Pulses:      Dorsalis pedis pulses are 0 on the right side, and 0 on the left side.       Posterior tibial pulses are 0 on the right side, and 0 on the  left side.  Pulmonary/Chest: Effort normal and breath sounds normal.  Abdominal: Soft. Bowel sounds are normal. There is no tenderness. There is no guarding.  Musculoskeletal: She exhibits no edema.       Arms: She is wheelchair-bound due to recent hip fracture and diffuse debilitating arthritis.  Neurological: She is alert and oriented to person, place, and time. She has normal reflexes. A sensory deficit is present. No cranial nerve deficit. Coordination normal.  Skin: Skin is warm and dry. No rash noted. No erythema. No pallor.  Psychiatric: She has a normal mood and affect. Judgment normal.    Results for orders placed or performed  in visit on 03/05/16  Bayer DCA Hb A1c Waived  Result Value Ref Range   Bayer DCA Hb A1c Waived 7.3 (H) <7.0 %  BMP8+EGFR  Result Value Ref Range   Glucose 170 (H) 65 - 99 mg/dL   BUN 32 (H) 8 - 27 mg/dL   Creatinine, Ser 1.43 (H) 0.57 - 1.00 mg/dL   GFR calc non Af Amer 37 (L) >59 mL/min/1.73   GFR calc Af Amer 43 (L) >59 mL/min/1.73   BUN/Creatinine Ratio 22 12 - 28   Sodium 141 134 - 144 mmol/L   Potassium 5.8 (H) 3.5 - 5.2 mmol/L   Chloride 107 (H) 96 - 106 mmol/L   CO2 16 (L) 18 - 29 mmol/L   Calcium 9.5 8.7 - 10.3 mg/dL  Lipid panel  Result Value Ref Range   Cholesterol, Total 159 100 - 199 mg/dL   Triglycerides 157 (H) 0 - 149 mg/dL   HDL 33 (L) >39 mg/dL   VLDL Cholesterol Cal 31 5 - 40 mg/dL   LDL Calculated 95 0 - 99 mg/dL   Chol/HDL Ratio 4.8 (H) 0.0 - 4.4 ratio units  TSH  Result Value Ref Range   TSH 3.290 0.450 - 4.500 uIU/mL   Diabetic Labs (most recent): Lab Results  Component Value Date   HGBA1C 7.3 (H) 11/21/2015   HGBA1C 7.6 07/09/2015   HGBA1C 10.0 (H) 04/02/2015   Lipid Panel     Component Value Date/Time   CHOL 159 03/05/2016 0904   CHOL 105 09/11/2012 1057   TRIG 157 (H) 03/05/2016 0904   TRIG 170 (H) 10/11/2014 1159   TRIG 79 09/11/2012 1057   HDL 33 (L) 03/05/2016 0904   HDL 53 10/11/2014 1159   HDL 41  09/11/2012 1057   CHOLHDL 4.8 (H) 03/05/2016 0904   LDLCALC 95 03/05/2016 0904   LDLCALC 50 02/14/2014 1012   LDLCALC 48 09/11/2012 1057     Assessment & Plan:   1. Type 2 diabetes mellitus with stage 4 chronic kidney disease, without long-term current use of insulin ([place])   - Patient has currently uncontrolled symptomatic type 2 DM since  70 years of age,  with most recent A1c of 7.3% improving from  10 %. Recent labs reviewed.   -her  diabetes is complicated by PAD, CKD, neuropathy and patient remains at a high risk for more acute and chronic complications of diabetes which include CAD, CVA, CKD, retinopathy, and neuropathy. These are all discussed in detail with the patient.  - I have counseled the patient on diet management  by adopting a carbohydrate restricted/protein rich diet.  - Suggestion is made for patient to avoid simple carbohydrates   from their diet including Cakes , Desserts, Ice Cream,  Soda (  diet and regular) , Sweet Tea , Candies,  Chips, Cookies, Artificial Sweeteners,   and "Sugar-free" Products . This will help patient to have stable blood glucose profile and potentially avoid unintended weight gain.  - I encouraged the patient to switch to  unprocessed or minimally processed complex starch and increased protein intake (animal or plant source), fruits, and vegetables.  - Patient is advised to stick to a routine mealtimes to eat 3 meals  a day and avoid unnecessary snacks ( to snack only to correct hypoglycemia).  - The patient will be scheduled with Jearld Fenton, RDN, CDE for individualized DM education.  - I have approached patient with the following individualized plan to manage diabetes and patient agrees:   -  She came with better and safer blood glucose profile .  -Her husband offers to help. I will continue NovoLog 70/30   8 units only with breakfast for pre-meal glucose above 90 mg/dL associated with monitoring of blood glucose  before meals and at  bedtime. - First priority in this patient would be to avoid hypoglycemia.  -If she can't control glycemia with one shot of insulin a day she will be switched to longer acting insulin.  if she cannot perform this therapy even with the help of her husband, her best option is placement in skilled care facility.  -Patient is encouraged to call clinic for blood glucose levels less than 70 or above 300 mg /dl.  -Patient is not a candidate for MTF, Incretin therapy.  - Patient specific target  A1c;  LDL, HDL, Triglycerides, and  Waist Circumference were discussed in detail.  2) BP/HTN: Controlled. Continue current medications including ACEI/ARB. 3) Lipids/HPL:  continue statins. 4)  Weight/Diet: CDE Consult has been initiated, she has limited ability to exercise.  5)  Hypothyroidism:  - continue LT4 75 mcg po qam.  - We discussed about correct intake of levothyroxine, at fasting, with water, separated by at least 30 minutes from breakfast, and separated by more than 4 hours from calcium, iron, multivitamins, acid reflux medications (PPIs). -Patient is made aware of the fact that thyroid hormone replacement is needed for life, dose to be adjusted by periodic monitoring of thyroid function tests.  5) Chronic Care/Health Maintenance:  -Patient is  on ACEI/ARB and Statin medications and encouraged to continue to follow up with Ophthalmology, Podiatrist at least yearly or according to recommendations, and advised to   stay away from smoking. I have recommended yearly flu vaccine and pneumonia vaccination at least every 5 years; moderate intensity exercise for up to 150 minutes weekly; and  sleep for at least 7 hours a day.  - 20 minutes of time was spent on the care of this patient , 50% of which was applied for counseling on diabetes complications and their preventions.  - Patient to bring meter and  blood glucose logs during their next visit in 2 weeks.   - I advised patient to maintain close  follow up with Eustaquio Maize, MD for primary care needs.  Follow up plan: - Return in about 3 months (around 06/11/2016) for follow up with pre-visit labs, meter, and logs.  Glade Lloyd, MD Phone: [phone]  Fax: [phone]   03/11/2016, 4:43 PM

## 2016-03-12 LAB — BMP8+EGFR
BUN/Creatinine Ratio: 21 (ref 12–28)
BUN: 29 mg/dL — ABNORMAL HIGH (ref 8–27)
CO2: 17 mmol/L — AB (ref 18–29)
Calcium: 9.3 mg/dL (ref 8.7–10.3)
Chloride: 103 mmol/L (ref 96–106)
Creatinine, Ser: 1.36 mg/dL — ABNORMAL HIGH (ref 0.57–1.00)
GFR calc Af Amer: 45 mL/min/{1.73_m2} — ABNORMAL LOW (ref >59)
GFR calc non Af Amer: 39 mL/min/{1.73_m2} — ABNORMAL LOW (ref >59)
GLUCOSE: 145 mg/dL — AB (ref 65–99)
POTASSIUM: 5.2 mmol/L (ref 3.5–5.2)
SODIUM: 140 mmol/L (ref 134–144)

## 2016-03-15 ENCOUNTER — Other Ambulatory Visit: Payer: Self-pay | Admitting: Pediatrics

## 2016-03-15 DIAGNOSIS — I5030 Unspecified diastolic (congestive) heart failure: Secondary | ICD-10-CM | POA: Diagnosis not present

## 2016-03-15 DIAGNOSIS — E11649 Type 2 diabetes mellitus with hypoglycemia without coma: Secondary | ICD-10-CM | POA: Diagnosis not present

## 2016-03-15 DIAGNOSIS — E1122 Type 2 diabetes mellitus with diabetic chronic kidney disease: Secondary | ICD-10-CM | POA: Diagnosis not present

## 2016-03-15 DIAGNOSIS — N184 Chronic kidney disease, stage 4 (severe): Secondary | ICD-10-CM | POA: Diagnosis not present

## 2016-03-15 DIAGNOSIS — K604 Rectal fistula: Secondary | ICD-10-CM | POA: Diagnosis not present

## 2016-03-15 DIAGNOSIS — I130 Hypertensive heart and chronic kidney disease with heart failure and stage 1 through stage 4 chronic kidney disease, or unspecified chronic kidney disease: Secondary | ICD-10-CM | POA: Diagnosis not present

## 2016-03-15 NOTE — Telephone Encounter (Signed)
Informed pt that we had sent these electronically on 10/13. She will check with [employer] to see if they have received these, if not she will get back with [place]

## 2016-03-16 DIAGNOSIS — N184 Chronic kidney disease, stage 4 (severe): Secondary | ICD-10-CM | POA: Diagnosis not present

## 2016-03-16 DIAGNOSIS — I5030 Unspecified diastolic (congestive) heart failure: Secondary | ICD-10-CM | POA: Diagnosis not present

## 2016-03-16 DIAGNOSIS — K604 Rectal fistula: Secondary | ICD-10-CM | POA: Diagnosis not present

## 2016-03-16 DIAGNOSIS — E11649 Type 2 diabetes mellitus with hypoglycemia without coma: Secondary | ICD-10-CM | POA: Diagnosis not present

## 2016-03-16 DIAGNOSIS — I130 Hypertensive heart and chronic kidney disease with heart failure and stage 1 through stage 4 chronic kidney disease, or unspecified chronic kidney disease: Secondary | ICD-10-CM | POA: Diagnosis not present

## 2016-03-16 DIAGNOSIS — E1122 Type 2 diabetes mellitus with diabetic chronic kidney disease: Secondary | ICD-10-CM | POA: Diagnosis not present

## 2016-03-18 DIAGNOSIS — E1122 Type 2 diabetes mellitus with diabetic chronic kidney disease: Secondary | ICD-10-CM | POA: Diagnosis not present

## 2016-03-18 DIAGNOSIS — K604 Rectal fistula: Secondary | ICD-10-CM | POA: Diagnosis not present

## 2016-03-18 DIAGNOSIS — I130 Hypertensive heart and chronic kidney disease with heart failure and stage 1 through stage 4 chronic kidney disease, or unspecified chronic kidney disease: Secondary | ICD-10-CM | POA: Diagnosis not present

## 2016-03-18 DIAGNOSIS — S31819A Unspecified open wound of right buttock, initial encounter: Secondary | ICD-10-CM | POA: Diagnosis not present

## 2016-03-18 DIAGNOSIS — T8189XA Other complications of procedures, not elsewhere classified, initial encounter: Secondary | ICD-10-CM | POA: Diagnosis not present

## 2016-03-18 DIAGNOSIS — N184 Chronic kidney disease, stage 4 (severe): Secondary | ICD-10-CM | POA: Diagnosis not present

## 2016-03-18 DIAGNOSIS — I5030 Unspecified diastolic (congestive) heart failure: Secondary | ICD-10-CM | POA: Diagnosis not present

## 2016-03-18 DIAGNOSIS — E11649 Type 2 diabetes mellitus with hypoglycemia without coma: Secondary | ICD-10-CM | POA: Diagnosis not present

## 2016-03-18 DIAGNOSIS — T8131XA Disruption of external operation (surgical) wound, not elsewhere classified, initial encounter: Secondary | ICD-10-CM | POA: Diagnosis not present

## 2016-03-19 DIAGNOSIS — K604 Rectal fistula: Secondary | ICD-10-CM | POA: Diagnosis not present

## 2016-03-19 DIAGNOSIS — I5030 Unspecified diastolic (congestive) heart failure: Secondary | ICD-10-CM | POA: Diagnosis not present

## 2016-03-19 DIAGNOSIS — I130 Hypertensive heart and chronic kidney disease with heart failure and stage 1 through stage 4 chronic kidney disease, or unspecified chronic kidney disease: Secondary | ICD-10-CM | POA: Diagnosis not present

## 2016-03-19 DIAGNOSIS — E11649 Type 2 diabetes mellitus with hypoglycemia without coma: Secondary | ICD-10-CM | POA: Diagnosis not present

## 2016-03-19 DIAGNOSIS — N184 Chronic kidney disease, stage 4 (severe): Secondary | ICD-10-CM | POA: Diagnosis not present

## 2016-03-19 DIAGNOSIS — E1122 Type 2 diabetes mellitus with diabetic chronic kidney disease: Secondary | ICD-10-CM | POA: Diagnosis not present

## 2016-03-22 DIAGNOSIS — E11649 Type 2 diabetes mellitus with hypoglycemia without coma: Secondary | ICD-10-CM | POA: Diagnosis not present

## 2016-03-22 DIAGNOSIS — I5030 Unspecified diastolic (congestive) heart failure: Secondary | ICD-10-CM | POA: Diagnosis not present

## 2016-03-22 DIAGNOSIS — K604 Rectal fistula: Secondary | ICD-10-CM | POA: Diagnosis not present

## 2016-03-22 DIAGNOSIS — I130 Hypertensive heart and chronic kidney disease with heart failure and stage 1 through stage 4 chronic kidney disease, or unspecified chronic kidney disease: Secondary | ICD-10-CM | POA: Diagnosis not present

## 2016-03-22 DIAGNOSIS — E1122 Type 2 diabetes mellitus with diabetic chronic kidney disease: Secondary | ICD-10-CM | POA: Diagnosis not present

## 2016-03-22 DIAGNOSIS — N184 Chronic kidney disease, stage 4 (severe): Secondary | ICD-10-CM | POA: Diagnosis not present

## 2016-03-24 DIAGNOSIS — E1122 Type 2 diabetes mellitus with diabetic chronic kidney disease: Secondary | ICD-10-CM | POA: Diagnosis not present

## 2016-03-24 DIAGNOSIS — I130 Hypertensive heart and chronic kidney disease with heart failure and stage 1 through stage 4 chronic kidney disease, or unspecified chronic kidney disease: Secondary | ICD-10-CM | POA: Diagnosis not present

## 2016-03-24 DIAGNOSIS — K604 Rectal fistula: Secondary | ICD-10-CM | POA: Diagnosis not present

## 2016-03-24 DIAGNOSIS — E11649 Type 2 diabetes mellitus with hypoglycemia without coma: Secondary | ICD-10-CM | POA: Diagnosis not present

## 2016-03-24 DIAGNOSIS — I5030 Unspecified diastolic (congestive) heart failure: Secondary | ICD-10-CM | POA: Diagnosis not present

## 2016-03-24 DIAGNOSIS — N184 Chronic kidney disease, stage 4 (severe): Secondary | ICD-10-CM | POA: Diagnosis not present

## 2016-03-26 DIAGNOSIS — I130 Hypertensive heart and chronic kidney disease with heart failure and stage 1 through stage 4 chronic kidney disease, or unspecified chronic kidney disease: Secondary | ICD-10-CM | POA: Diagnosis not present

## 2016-03-26 DIAGNOSIS — I5030 Unspecified diastolic (congestive) heart failure: Secondary | ICD-10-CM | POA: Diagnosis not present

## 2016-03-26 DIAGNOSIS — N184 Chronic kidney disease, stage 4 (severe): Secondary | ICD-10-CM | POA: Diagnosis not present

## 2016-03-26 DIAGNOSIS — E1122 Type 2 diabetes mellitus with diabetic chronic kidney disease: Secondary | ICD-10-CM | POA: Diagnosis not present

## 2016-03-26 DIAGNOSIS — E11649 Type 2 diabetes mellitus with hypoglycemia without coma: Secondary | ICD-10-CM | POA: Diagnosis not present

## 2016-03-26 DIAGNOSIS — K604 Rectal fistula: Secondary | ICD-10-CM | POA: Diagnosis not present

## 2016-03-29 DIAGNOSIS — I5030 Unspecified diastolic (congestive) heart failure: Secondary | ICD-10-CM | POA: Diagnosis not present

## 2016-03-29 DIAGNOSIS — I130 Hypertensive heart and chronic kidney disease with heart failure and stage 1 through stage 4 chronic kidney disease, or unspecified chronic kidney disease: Secondary | ICD-10-CM | POA: Diagnosis not present

## 2016-03-29 DIAGNOSIS — N184 Chronic kidney disease, stage 4 (severe): Secondary | ICD-10-CM | POA: Diagnosis not present

## 2016-03-29 DIAGNOSIS — E11649 Type 2 diabetes mellitus with hypoglycemia without coma: Secondary | ICD-10-CM | POA: Diagnosis not present

## 2016-03-29 DIAGNOSIS — E1122 Type 2 diabetes mellitus with diabetic chronic kidney disease: Secondary | ICD-10-CM | POA: Diagnosis not present

## 2016-03-29 DIAGNOSIS — K604 Rectal fistula: Secondary | ICD-10-CM | POA: Diagnosis not present

## 2016-03-31 DIAGNOSIS — E11649 Type 2 diabetes mellitus with hypoglycemia without coma: Secondary | ICD-10-CM | POA: Diagnosis not present

## 2016-03-31 DIAGNOSIS — E1122 Type 2 diabetes mellitus with diabetic chronic kidney disease: Secondary | ICD-10-CM | POA: Diagnosis not present

## 2016-03-31 DIAGNOSIS — K603 Anal fistula: Secondary | ICD-10-CM | POA: Diagnosis not present

## 2016-03-31 DIAGNOSIS — N184 Chronic kidney disease, stage 4 (severe): Secondary | ICD-10-CM | POA: Diagnosis not present

## 2016-03-31 DIAGNOSIS — K604 Rectal fistula: Secondary | ICD-10-CM | POA: Diagnosis not present

## 2016-03-31 DIAGNOSIS — I5030 Unspecified diastolic (congestive) heart failure: Secondary | ICD-10-CM | POA: Diagnosis not present

## 2016-03-31 DIAGNOSIS — I130 Hypertensive heart and chronic kidney disease with heart failure and stage 1 through stage 4 chronic kidney disease, or unspecified chronic kidney disease: Secondary | ICD-10-CM | POA: Diagnosis not present

## 2016-03-31 DIAGNOSIS — S31000D Unspecified open wound of lower back and pelvis without penetration into retroperitoneum, subsequent encounter: Secondary | ICD-10-CM | POA: Diagnosis not present

## 2016-03-31 IMAGING — MR MR LUMBAR SPINE W/O CM
4 of 5 series · 15 of 48 positions shown · non-contrast
Comparison: Pelvic MRI 11/04/2014

CLINICAL DATA: Left leg weakness. Lumbar spondylosis with
myelopathy.

EXAM:
MRI LUMBAR SPINE WITHOUT CONTRAST
TECHNIQUE: Multiplanar, multisequence MR imaging of the lumbar spine was
performed. No intravenous contrast was administered.

[Series 3: T2 · sagittal · 4.0mm · 0.70mm/px · 6 of 15 slices shown (1 of 2)]
[im 1/15]
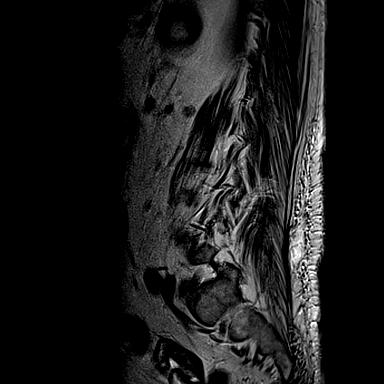
[im 3/15]
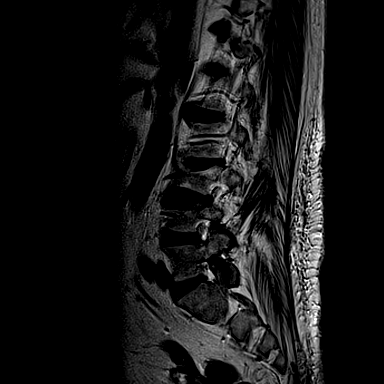
[im 6/15]
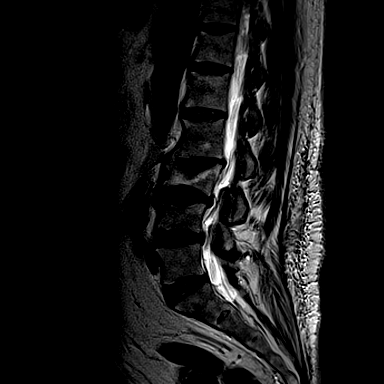
[im 9/15]
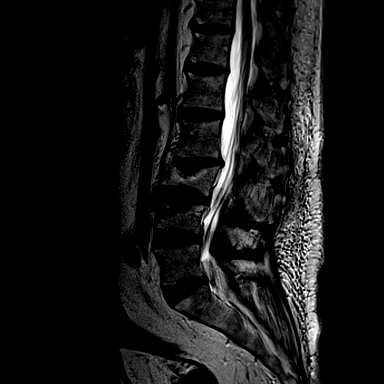
[im 12/15]
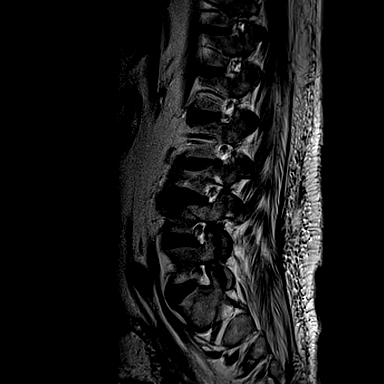
[im 15/15]
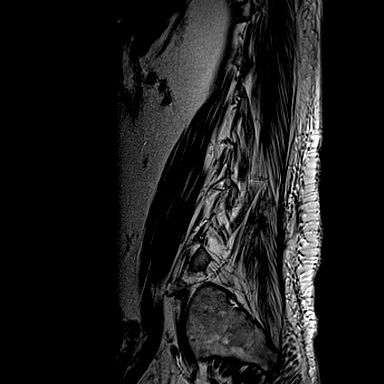

[Series 4: T1 · sagittal · 4.0mm · 0.35mm/px · 3 of 15 slices shown (1 of 2)]
[im 3/15]
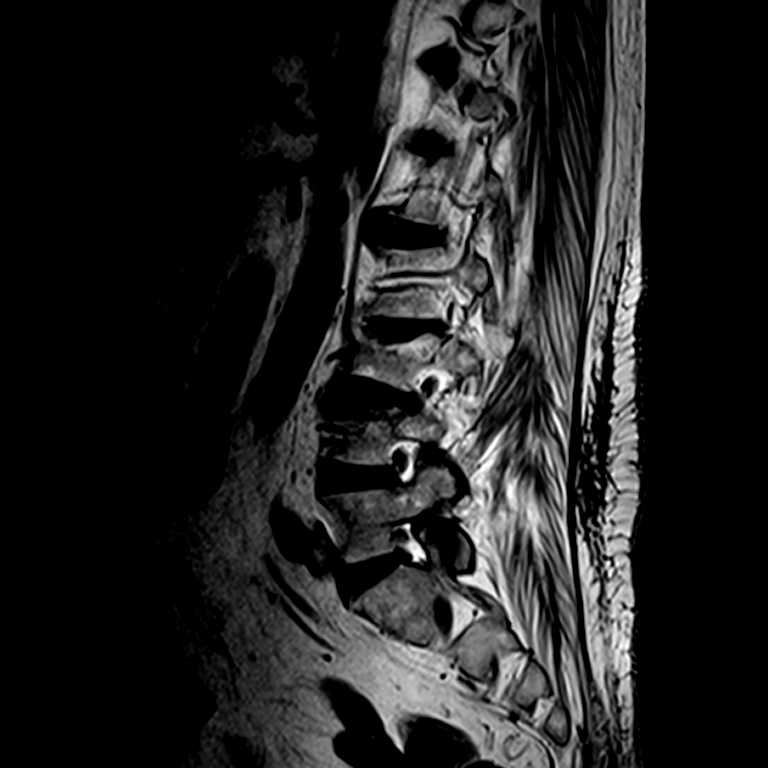
[im 9/15]
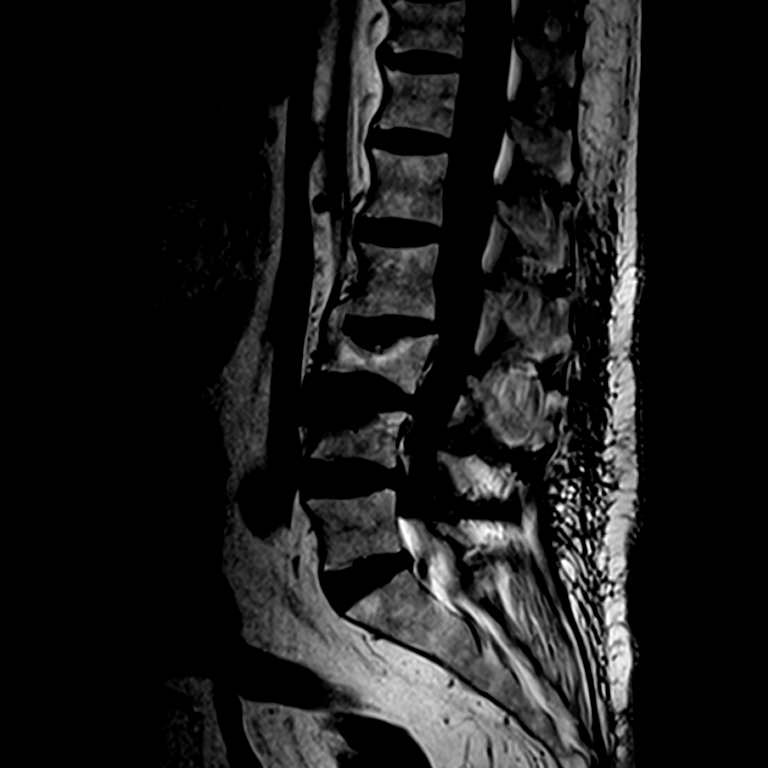
[im 15/15]
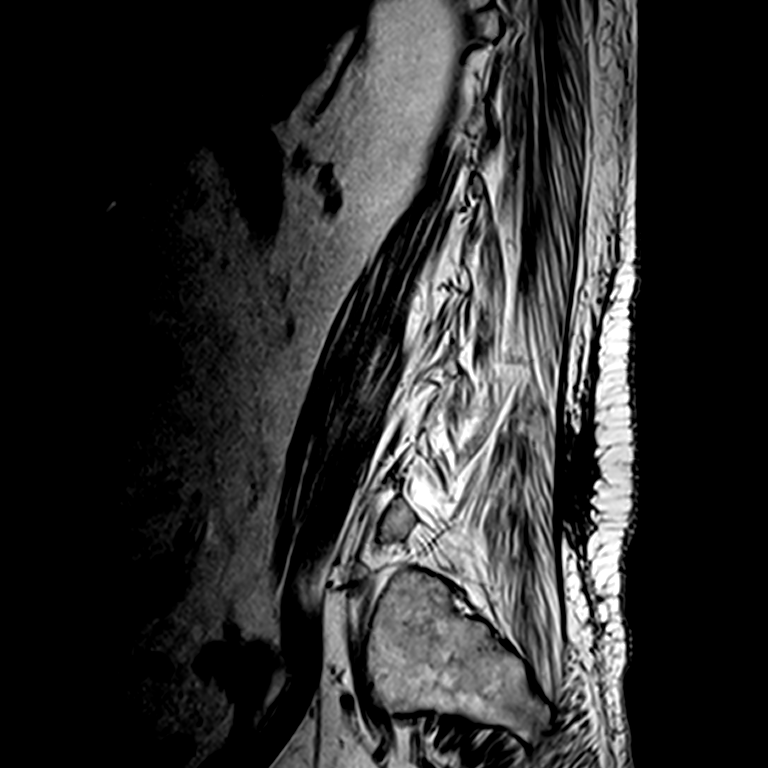

[Series 6: T2 · axial · 4.0mm · 0.19mm/px · z∈[-37,+87]mm · 3 of 39 slices shown (2 of 2)]
[im 6/39]
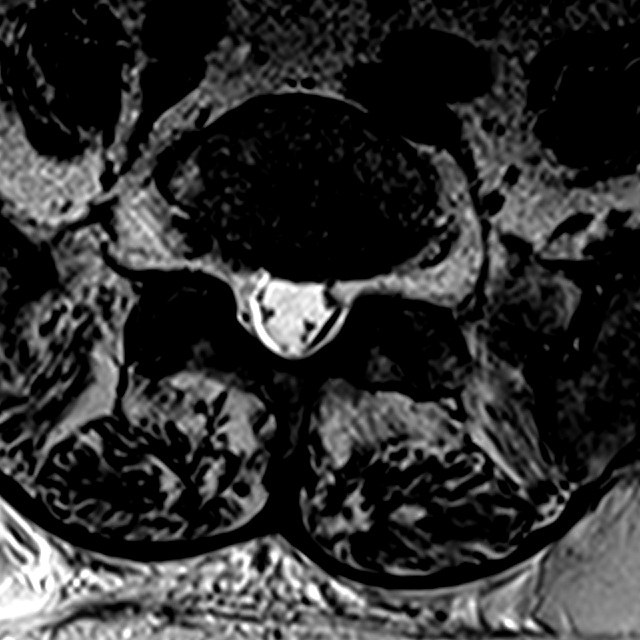
[im 20/39]
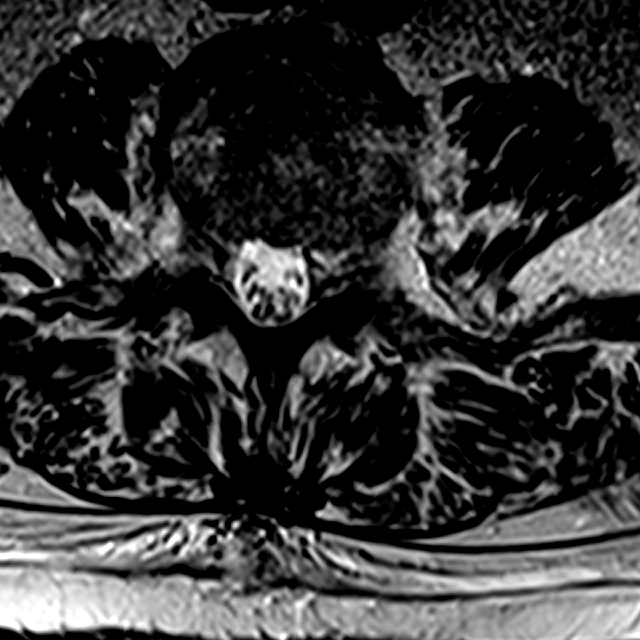
[im 33/39]
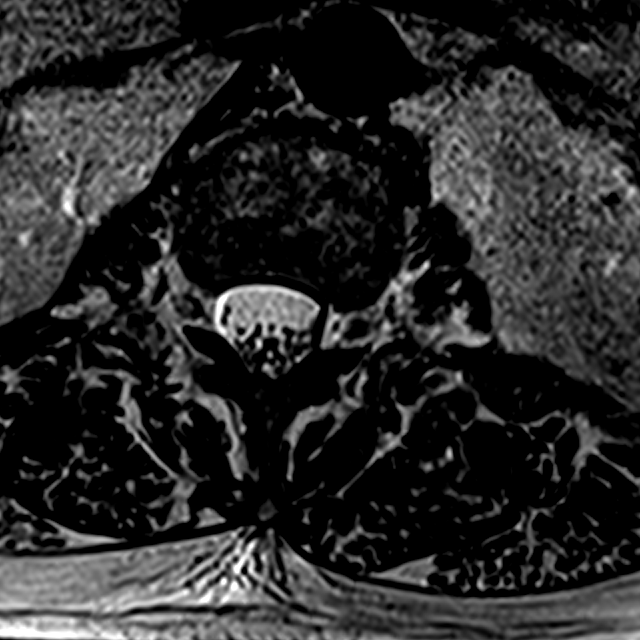

[Series 7: T1 · axial · 4.0mm · 0.19mm/px · z∈[-37,+88]mm · 3 of 39 slices shown (2 of 2)]
[im 6/39]
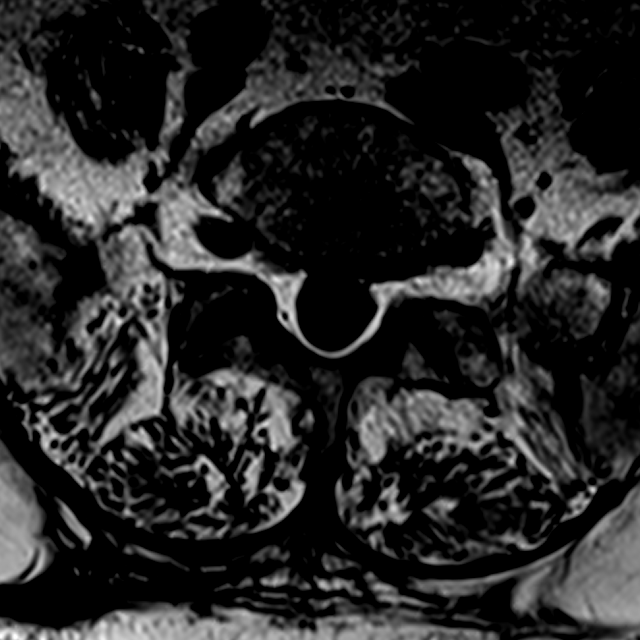
[im 20/39]
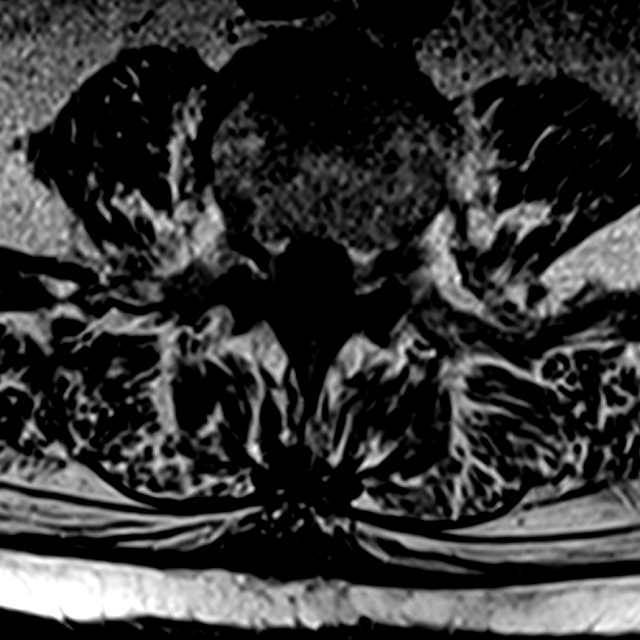
[im 33/39]
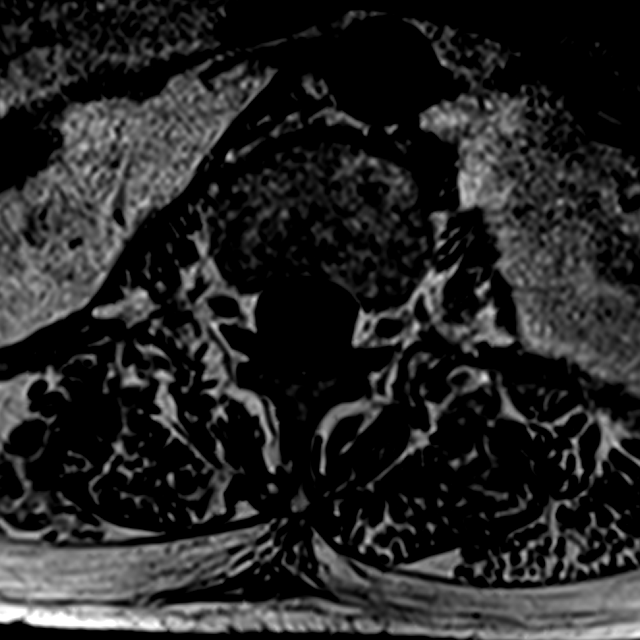

[15 of 48 positions shown; findings below may reference images not displayed]

FINDINGS: Moderately severe chronic compression fracture of L3 with mild
retropulsion of bone into the canal.

Mild to moderate fracture of L4. There is edema in the superior
endplate suggesting possible acute on top of chronic fracture. This
edema was present on the prior MRI as well. No other fracture or
mass. Conus medullaris normal and terminates L1-2.

L1-2:  Negative

L2-3: Disc degeneration. Mild spinal stenosis due to retropulsion of
L3 into the canal from chronic fracture. Early facet degeneration

L3-4: 3 mm anterior slip. Bilateral facet hypertrophy and mild
narrowing of the spinal canal.

L4-5: Diffuse disc bulging and facet degeneration. Mild narrowing of
the canal. Neural foramina patent

L5-S1:  Negative
IMPRESSION: Moderately severe chronic compression fracture L3 with mild
retropulsion of bone into the canal causing mild spinal stenosis

Mild to moderate fracture of L4. There appears to be a chronic as
well as a subacute component. No significant spinal stenosis.

Grade 1 slip L3-4. Mild narrowing of the canal L3-4 and L4-5 without
significant stenosis.

## 2016-04-01 ENCOUNTER — Encounter (HOSPITAL_BASED_OUTPATIENT_CLINIC_OR_DEPARTMENT_OTHER): Payer: Medicare Other | Attending: Internal Medicine

## 2016-04-01 DIAGNOSIS — Y838 Other surgical procedures as the cause of abnormal reaction of the patient, or of later complication, without mention of misadventure at the time of the procedure: Secondary | ICD-10-CM | POA: Diagnosis not present

## 2016-04-01 DIAGNOSIS — S31819A Unspecified open wound of right buttock, initial encounter: Secondary | ICD-10-CM | POA: Diagnosis not present

## 2016-04-01 DIAGNOSIS — T8189XA Other complications of procedures, not elsewhere classified, initial encounter: Secondary | ICD-10-CM | POA: Insufficient documentation

## 2016-04-01 DIAGNOSIS — K604 Rectal fistula: Secondary | ICD-10-CM | POA: Diagnosis not present

## 2016-04-01 DIAGNOSIS — T8131XA Disruption of external operation (surgical) wound, not elsewhere classified, initial encounter: Secondary | ICD-10-CM | POA: Diagnosis not present

## 2016-04-01 DIAGNOSIS — X58XXXA Exposure to other specified factors, initial encounter: Secondary | ICD-10-CM | POA: Diagnosis not present

## 2016-04-02 DIAGNOSIS — K604 Rectal fistula: Secondary | ICD-10-CM | POA: Diagnosis not present

## 2016-04-02 DIAGNOSIS — E11649 Type 2 diabetes mellitus with hypoglycemia without coma: Secondary | ICD-10-CM | POA: Diagnosis not present

## 2016-04-02 DIAGNOSIS — I5030 Unspecified diastolic (congestive) heart failure: Secondary | ICD-10-CM | POA: Diagnosis not present

## 2016-04-02 DIAGNOSIS — E1122 Type 2 diabetes mellitus with diabetic chronic kidney disease: Secondary | ICD-10-CM | POA: Diagnosis not present

## 2016-04-02 DIAGNOSIS — N184 Chronic kidney disease, stage 4 (severe): Secondary | ICD-10-CM | POA: Diagnosis not present

## 2016-04-02 DIAGNOSIS — I130 Hypertensive heart and chronic kidney disease with heart failure and stage 1 through stage 4 chronic kidney disease, or unspecified chronic kidney disease: Secondary | ICD-10-CM | POA: Diagnosis not present

## 2016-04-05 DIAGNOSIS — E1122 Type 2 diabetes mellitus with diabetic chronic kidney disease: Secondary | ICD-10-CM | POA: Diagnosis not present

## 2016-04-05 DIAGNOSIS — N184 Chronic kidney disease, stage 4 (severe): Secondary | ICD-10-CM | POA: Diagnosis not present

## 2016-04-05 DIAGNOSIS — I130 Hypertensive heart and chronic kidney disease with heart failure and stage 1 through stage 4 chronic kidney disease, or unspecified chronic kidney disease: Secondary | ICD-10-CM | POA: Diagnosis not present

## 2016-04-05 DIAGNOSIS — I5030 Unspecified diastolic (congestive) heart failure: Secondary | ICD-10-CM | POA: Diagnosis not present

## 2016-04-05 DIAGNOSIS — K604 Rectal fistula: Secondary | ICD-10-CM | POA: Diagnosis not present

## 2016-04-05 DIAGNOSIS — E11649 Type 2 diabetes mellitus with hypoglycemia without coma: Secondary | ICD-10-CM | POA: Diagnosis not present

## 2016-04-06 DIAGNOSIS — L84 Corns and callosities: Secondary | ICD-10-CM | POA: Diagnosis not present

## 2016-04-06 DIAGNOSIS — N184 Chronic kidney disease, stage 4 (severe): Secondary | ICD-10-CM | POA: Diagnosis not present

## 2016-04-06 DIAGNOSIS — N2581 Secondary hyperparathyroidism of renal origin: Secondary | ICD-10-CM | POA: Diagnosis not present

## 2016-04-06 DIAGNOSIS — D631 Anemia in chronic kidney disease: Secondary | ICD-10-CM | POA: Diagnosis not present

## 2016-04-06 DIAGNOSIS — R809 Proteinuria, unspecified: Secondary | ICD-10-CM | POA: Diagnosis not present

## 2016-04-06 DIAGNOSIS — E1122 Type 2 diabetes mellitus with diabetic chronic kidney disease: Secondary | ICD-10-CM | POA: Diagnosis not present

## 2016-04-06 DIAGNOSIS — I129 Hypertensive chronic kidney disease with stage 1 through stage 4 chronic kidney disease, or unspecified chronic kidney disease: Secondary | ICD-10-CM | POA: Diagnosis not present

## 2016-04-06 DIAGNOSIS — E1151 Type 2 diabetes mellitus with diabetic peripheral angiopathy without gangrene: Secondary | ICD-10-CM | POA: Diagnosis not present

## 2016-04-06 DIAGNOSIS — B351 Tinea unguium: Secondary | ICD-10-CM | POA: Diagnosis not present

## 2016-04-06 DIAGNOSIS — N183 Chronic kidney disease, stage 3 (moderate): Secondary | ICD-10-CM | POA: Diagnosis not present

## 2016-04-07 DIAGNOSIS — I130 Hypertensive heart and chronic kidney disease with heart failure and stage 1 through stage 4 chronic kidney disease, or unspecified chronic kidney disease: Secondary | ICD-10-CM | POA: Diagnosis not present

## 2016-04-07 DIAGNOSIS — E11649 Type 2 diabetes mellitus with hypoglycemia without coma: Secondary | ICD-10-CM | POA: Diagnosis not present

## 2016-04-07 DIAGNOSIS — I5030 Unspecified diastolic (congestive) heart failure: Secondary | ICD-10-CM | POA: Diagnosis not present

## 2016-04-07 DIAGNOSIS — K604 Rectal fistula: Secondary | ICD-10-CM | POA: Diagnosis not present

## 2016-04-07 DIAGNOSIS — N184 Chronic kidney disease, stage 4 (severe): Secondary | ICD-10-CM | POA: Diagnosis not present

## 2016-04-07 DIAGNOSIS — E1122 Type 2 diabetes mellitus with diabetic chronic kidney disease: Secondary | ICD-10-CM | POA: Diagnosis not present

## 2016-04-09 DIAGNOSIS — N184 Chronic kidney disease, stage 4 (severe): Secondary | ICD-10-CM | POA: Diagnosis not present

## 2016-04-09 DIAGNOSIS — I5030 Unspecified diastolic (congestive) heart failure: Secondary | ICD-10-CM | POA: Diagnosis not present

## 2016-04-09 DIAGNOSIS — I130 Hypertensive heart and chronic kidney disease with heart failure and stage 1 through stage 4 chronic kidney disease, or unspecified chronic kidney disease: Secondary | ICD-10-CM | POA: Diagnosis not present

## 2016-04-09 DIAGNOSIS — K604 Rectal fistula: Secondary | ICD-10-CM | POA: Diagnosis not present

## 2016-04-09 DIAGNOSIS — E1122 Type 2 diabetes mellitus with diabetic chronic kidney disease: Secondary | ICD-10-CM | POA: Diagnosis not present

## 2016-04-09 DIAGNOSIS — E11649 Type 2 diabetes mellitus with hypoglycemia without coma: Secondary | ICD-10-CM | POA: Diagnosis not present

## 2016-04-12 DIAGNOSIS — N184 Chronic kidney disease, stage 4 (severe): Secondary | ICD-10-CM | POA: Diagnosis not present

## 2016-04-12 DIAGNOSIS — E11649 Type 2 diabetes mellitus with hypoglycemia without coma: Secondary | ICD-10-CM | POA: Diagnosis not present

## 2016-04-12 DIAGNOSIS — I5030 Unspecified diastolic (congestive) heart failure: Secondary | ICD-10-CM | POA: Diagnosis not present

## 2016-04-12 DIAGNOSIS — E1122 Type 2 diabetes mellitus with diabetic chronic kidney disease: Secondary | ICD-10-CM | POA: Diagnosis not present

## 2016-04-12 DIAGNOSIS — K604 Rectal fistula: Secondary | ICD-10-CM | POA: Diagnosis not present

## 2016-04-12 DIAGNOSIS — I130 Hypertensive heart and chronic kidney disease with heart failure and stage 1 through stage 4 chronic kidney disease, or unspecified chronic kidney disease: Secondary | ICD-10-CM | POA: Diagnosis not present

## 2016-04-13 DIAGNOSIS — K604 Rectal fistula: Secondary | ICD-10-CM | POA: Diagnosis not present

## 2016-04-13 DIAGNOSIS — I5030 Unspecified diastolic (congestive) heart failure: Secondary | ICD-10-CM | POA: Diagnosis not present

## 2016-04-13 DIAGNOSIS — I130 Hypertensive heart and chronic kidney disease with heart failure and stage 1 through stage 4 chronic kidney disease, or unspecified chronic kidney disease: Secondary | ICD-10-CM | POA: Diagnosis not present

## 2016-04-13 DIAGNOSIS — E11649 Type 2 diabetes mellitus with hypoglycemia without coma: Secondary | ICD-10-CM | POA: Diagnosis not present

## 2016-04-13 DIAGNOSIS — E1122 Type 2 diabetes mellitus with diabetic chronic kidney disease: Secondary | ICD-10-CM | POA: Diagnosis not present

## 2016-04-13 DIAGNOSIS — N184 Chronic kidney disease, stage 4 (severe): Secondary | ICD-10-CM | POA: Diagnosis not present

## 2016-04-14 DIAGNOSIS — K604 Rectal fistula: Secondary | ICD-10-CM | POA: Diagnosis not present

## 2016-04-14 DIAGNOSIS — N184 Chronic kidney disease, stage 4 (severe): Secondary | ICD-10-CM | POA: Diagnosis not present

## 2016-04-14 DIAGNOSIS — I5030 Unspecified diastolic (congestive) heart failure: Secondary | ICD-10-CM | POA: Diagnosis not present

## 2016-04-14 DIAGNOSIS — E11649 Type 2 diabetes mellitus with hypoglycemia without coma: Secondary | ICD-10-CM | POA: Diagnosis not present

## 2016-04-14 DIAGNOSIS — I130 Hypertensive heart and chronic kidney disease with heart failure and stage 1 through stage 4 chronic kidney disease, or unspecified chronic kidney disease: Secondary | ICD-10-CM | POA: Diagnosis not present

## 2016-04-14 DIAGNOSIS — E1122 Type 2 diabetes mellitus with diabetic chronic kidney disease: Secondary | ICD-10-CM | POA: Diagnosis not present

## 2016-04-16 ENCOUNTER — Other Ambulatory Visit: Payer: Self-pay | Admitting: "Endocrinology

## 2016-04-19 ENCOUNTER — Telehealth: Payer: Self-pay | Admitting: "Endocrinology

## 2016-04-19 DIAGNOSIS — N184 Chronic kidney disease, stage 4 (severe): Secondary | ICD-10-CM | POA: Diagnosis not present

## 2016-04-19 DIAGNOSIS — I5030 Unspecified diastolic (congestive) heart failure: Secondary | ICD-10-CM | POA: Diagnosis not present

## 2016-04-19 DIAGNOSIS — K604 Rectal fistula: Secondary | ICD-10-CM | POA: Diagnosis not present

## 2016-04-19 DIAGNOSIS — E1122 Type 2 diabetes mellitus with diabetic chronic kidney disease: Secondary | ICD-10-CM | POA: Diagnosis not present

## 2016-04-19 DIAGNOSIS — I130 Hypertensive heart and chronic kidney disease with heart failure and stage 1 through stage 4 chronic kidney disease, or unspecified chronic kidney disease: Secondary | ICD-10-CM | POA: Diagnosis not present

## 2016-04-19 DIAGNOSIS — E11649 Type 2 diabetes mellitus with hypoglycemia without coma: Secondary | ICD-10-CM | POA: Diagnosis not present

## 2016-04-19 MED ORDER — GLUCOSE BLOOD VI STRP: ORAL_STRIP | 5 refills | Status: DC

## 2016-04-19 MED ORDER — ACCU-CHEK SOFT TOUCH LANCETS MISC: 5 refills | Status: DC

## 2016-04-19 NOTE — Telephone Encounter (Signed)
[facility] in [place] needs new RX sent in due to Medicare rules. They have an old RX on file for her Lancets and test strips. Please send in new one with the DX code on it.

## 2016-04-21 DIAGNOSIS — N184 Chronic kidney disease, stage 4 (severe): Secondary | ICD-10-CM | POA: Diagnosis not present

## 2016-04-21 DIAGNOSIS — E1122 Type 2 diabetes mellitus with diabetic chronic kidney disease: Secondary | ICD-10-CM | POA: Diagnosis not present

## 2016-04-21 DIAGNOSIS — E11649 Type 2 diabetes mellitus with hypoglycemia without coma: Secondary | ICD-10-CM | POA: Diagnosis not present

## 2016-04-21 DIAGNOSIS — K604 Rectal fistula: Secondary | ICD-10-CM | POA: Diagnosis not present

## 2016-04-21 DIAGNOSIS — I5030 Unspecified diastolic (congestive) heart failure: Secondary | ICD-10-CM | POA: Diagnosis not present

## 2016-04-21 DIAGNOSIS — I130 Hypertensive heart and chronic kidney disease with heart failure and stage 1 through stage 4 chronic kidney disease, or unspecified chronic kidney disease: Secondary | ICD-10-CM | POA: Diagnosis not present

## 2016-04-22 ENCOUNTER — Telehealth: Payer: Self-pay | Admitting: Pediatrics

## 2016-04-22 DIAGNOSIS — S31819A Unspecified open wound of right buttock, initial encounter: Secondary | ICD-10-CM | POA: Diagnosis not present

## 2016-04-22 DIAGNOSIS — S31809S Unspecified open wound of unspecified buttock, sequela: Secondary | ICD-10-CM | POA: Diagnosis not present

## 2016-04-22 DIAGNOSIS — K604 Rectal fistula: Secondary | ICD-10-CM | POA: Diagnosis not present

## 2016-04-22 DIAGNOSIS — T8189XA Other complications of procedures, not elsewhere classified, initial encounter: Secondary | ICD-10-CM | POA: Diagnosis not present

## 2016-04-22 NOTE — Telephone Encounter (Signed)
noted 

## 2016-04-23 DIAGNOSIS — I5030 Unspecified diastolic (congestive) heart failure: Secondary | ICD-10-CM | POA: Diagnosis not present

## 2016-04-23 DIAGNOSIS — K604 Rectal fistula: Secondary | ICD-10-CM | POA: Diagnosis not present

## 2016-04-23 DIAGNOSIS — N184 Chronic kidney disease, stage 4 (severe): Secondary | ICD-10-CM | POA: Diagnosis not present

## 2016-04-23 DIAGNOSIS — E1122 Type 2 diabetes mellitus with diabetic chronic kidney disease: Secondary | ICD-10-CM | POA: Diagnosis not present

## 2016-04-23 DIAGNOSIS — I130 Hypertensive heart and chronic kidney disease with heart failure and stage 1 through stage 4 chronic kidney disease, or unspecified chronic kidney disease: Secondary | ICD-10-CM | POA: Diagnosis not present

## 2016-04-23 DIAGNOSIS — E11649 Type 2 diabetes mellitus with hypoglycemia without coma: Secondary | ICD-10-CM | POA: Diagnosis not present

## 2016-04-26 DIAGNOSIS — E1122 Type 2 diabetes mellitus with diabetic chronic kidney disease: Secondary | ICD-10-CM | POA: Diagnosis not present

## 2016-04-26 DIAGNOSIS — I5030 Unspecified diastolic (congestive) heart failure: Secondary | ICD-10-CM | POA: Diagnosis not present

## 2016-04-26 DIAGNOSIS — K604 Rectal fistula: Secondary | ICD-10-CM | POA: Diagnosis not present

## 2016-04-26 DIAGNOSIS — I130 Hypertensive heart and chronic kidney disease with heart failure and stage 1 through stage 4 chronic kidney disease, or unspecified chronic kidney disease: Secondary | ICD-10-CM | POA: Diagnosis not present

## 2016-04-26 DIAGNOSIS — E11649 Type 2 diabetes mellitus with hypoglycemia without coma: Secondary | ICD-10-CM | POA: Diagnosis not present

## 2016-04-26 DIAGNOSIS — N184 Chronic kidney disease, stage 4 (severe): Secondary | ICD-10-CM | POA: Diagnosis not present

## 2016-04-26 DIAGNOSIS — K603 Anal fistula: Secondary | ICD-10-CM | POA: Diagnosis not present

## 2016-04-28 DIAGNOSIS — I5030 Unspecified diastolic (congestive) heart failure: Secondary | ICD-10-CM | POA: Diagnosis not present

## 2016-04-28 DIAGNOSIS — E1122 Type 2 diabetes mellitus with diabetic chronic kidney disease: Secondary | ICD-10-CM | POA: Diagnosis not present

## 2016-04-28 DIAGNOSIS — I130 Hypertensive heart and chronic kidney disease with heart failure and stage 1 through stage 4 chronic kidney disease, or unspecified chronic kidney disease: Secondary | ICD-10-CM | POA: Diagnosis not present

## 2016-04-28 DIAGNOSIS — K604 Rectal fistula: Secondary | ICD-10-CM | POA: Diagnosis not present

## 2016-04-28 DIAGNOSIS — E11649 Type 2 diabetes mellitus with hypoglycemia without coma: Secondary | ICD-10-CM | POA: Diagnosis not present

## 2016-04-28 DIAGNOSIS — N184 Chronic kidney disease, stage 4 (severe): Secondary | ICD-10-CM | POA: Diagnosis not present

## 2016-04-30 DIAGNOSIS — I130 Hypertensive heart and chronic kidney disease with heart failure and stage 1 through stage 4 chronic kidney disease, or unspecified chronic kidney disease: Secondary | ICD-10-CM | POA: Diagnosis not present

## 2016-04-30 DIAGNOSIS — N184 Chronic kidney disease, stage 4 (severe): Secondary | ICD-10-CM | POA: Diagnosis not present

## 2016-04-30 DIAGNOSIS — E11649 Type 2 diabetes mellitus with hypoglycemia without coma: Secondary | ICD-10-CM | POA: Diagnosis not present

## 2016-04-30 DIAGNOSIS — K604 Rectal fistula: Secondary | ICD-10-CM | POA: Diagnosis not present

## 2016-04-30 DIAGNOSIS — E1122 Type 2 diabetes mellitus with diabetic chronic kidney disease: Secondary | ICD-10-CM | POA: Diagnosis not present

## 2016-04-30 DIAGNOSIS — I5030 Unspecified diastolic (congestive) heart failure: Secondary | ICD-10-CM | POA: Diagnosis not present

## 2016-05-03 DIAGNOSIS — E1122 Type 2 diabetes mellitus with diabetic chronic kidney disease: Secondary | ICD-10-CM | POA: Diagnosis not present

## 2016-05-03 DIAGNOSIS — I130 Hypertensive heart and chronic kidney disease with heart failure and stage 1 through stage 4 chronic kidney disease, or unspecified chronic kidney disease: Secondary | ICD-10-CM | POA: Diagnosis not present

## 2016-05-03 DIAGNOSIS — N184 Chronic kidney disease, stage 4 (severe): Secondary | ICD-10-CM | POA: Diagnosis not present

## 2016-05-03 DIAGNOSIS — E11649 Type 2 diabetes mellitus with hypoglycemia without coma: Secondary | ICD-10-CM | POA: Diagnosis not present

## 2016-05-03 DIAGNOSIS — K604 Rectal fistula: Secondary | ICD-10-CM | POA: Diagnosis not present

## 2016-05-03 DIAGNOSIS — I5030 Unspecified diastolic (congestive) heart failure: Secondary | ICD-10-CM | POA: Diagnosis not present

## 2016-05-04 ENCOUNTER — Emergency Department (HOSPITAL_COMMUNITY)
Admission: EM | Admit: 2016-05-04 | Discharge: 2016-05-04 | Disposition: A | Discharge status: Home / Self Care | Payer: Medicare Other | Source: Home / Self Care | Attending: Emergency Medicine | Admitting: Emergency Medicine

## 2016-05-04 ENCOUNTER — Encounter (HOSPITAL_COMMUNITY): Payer: Self-pay | Admitting: Emergency Medicine

## 2016-05-04 DIAGNOSIS — Z79899 Other long term (current) drug therapy: Secondary | ICD-10-CM

## 2016-05-04 DIAGNOSIS — Z794 Long term (current) use of insulin: Secondary | ICD-10-CM

## 2016-05-04 DIAGNOSIS — E039 Hypothyroidism, unspecified: Secondary | ICD-10-CM | POA: Insufficient documentation

## 2016-05-04 DIAGNOSIS — L03115 Cellulitis of right lower limb: Secondary | ICD-10-CM

## 2016-05-04 DIAGNOSIS — I2510 Atherosclerotic heart disease of native coronary artery without angina pectoris: Secondary | ICD-10-CM

## 2016-05-04 DIAGNOSIS — M1000 Idiopathic gout, unspecified site: Secondary | ICD-10-CM | POA: Insufficient documentation

## 2016-05-04 DIAGNOSIS — N179 Acute kidney failure, unspecified: Secondary | ICD-10-CM | POA: Diagnosis not present

## 2016-05-04 DIAGNOSIS — I129 Hypertensive chronic kidney disease with stage 1 through stage 4 chronic kidney disease, or unspecified chronic kidney disease: Secondary | ICD-10-CM

## 2016-05-04 DIAGNOSIS — N184 Chronic kidney disease, stage 4 (severe): Secondary | ICD-10-CM

## 2016-05-04 DIAGNOSIS — L03119 Cellulitis of unspecified part of limb: Secondary | ICD-10-CM

## 2016-05-04 DIAGNOSIS — M109 Gout, unspecified: Secondary | ICD-10-CM | POA: Diagnosis not present

## 2016-05-04 DIAGNOSIS — R7881 Bacteremia: Secondary | ICD-10-CM | POA: Diagnosis not present

## 2016-05-04 DIAGNOSIS — M1A9XX1 Chronic gout, unspecified, with tophus (tophi): Secondary | ICD-10-CM

## 2016-05-04 DIAGNOSIS — M7989 Other specified soft tissue disorders: Secondary | ICD-10-CM | POA: Diagnosis not present

## 2016-05-04 DIAGNOSIS — E1122 Type 2 diabetes mellitus with diabetic chronic kidney disease: Secondary | ICD-10-CM | POA: Diagnosis not present

## 2016-05-04 DIAGNOSIS — E11621 Type 2 diabetes mellitus with foot ulcer: Secondary | ICD-10-CM | POA: Diagnosis not present

## 2016-05-04 DIAGNOSIS — R7889 Finding of other specified substances, not normally found in blood: Secondary | ICD-10-CM | POA: Diagnosis not present

## 2016-05-04 DIAGNOSIS — L97519 Non-pressure chronic ulcer of other part of right foot with unspecified severity: Secondary | ICD-10-CM | POA: Diagnosis not present

## 2016-05-04 LAB — CBC WITH DIFFERENTIAL/PLATELET
Basophils Absolute: 0 10*3/uL (ref 0.0–0.1)
Basophils Relative: 0 %
Eosinophils Absolute: 0.1 10*3/uL (ref 0.0–0.7)
Eosinophils Relative: 1 %
HCT: 34.4 % — ABNORMAL LOW (ref 36.0–46.0)
Hemoglobin: 11.3 g/dL — ABNORMAL LOW (ref 12.0–15.0)
Lymphocytes Relative: 15 %
Lymphs Abs: 1.8 10*3/uL (ref 0.7–4.0)
MCH: 29.7 pg (ref 26.0–34.0)
MCHC: 32.8 g/dL (ref 30.0–36.0)
MCV: 90.3 fL (ref 78.0–100.0)
Monocytes Absolute: 1.1 10*3/uL — ABNORMAL HIGH (ref 0.1–1.0)
Monocytes Relative: 9 %
Neutro Abs: 9 10*3/uL — ABNORMAL HIGH (ref 1.7–7.7)
Neutrophils Relative %: 75 %
Platelets: 205 10*3/uL (ref 150–400)
RBC: 3.81 MIL/uL — ABNORMAL LOW (ref 3.87–5.11)
RDW: 12.6 % (ref 11.5–15.5)
WBC: 12 10*3/uL — ABNORMAL HIGH (ref 4.0–10.5)

## 2016-05-04 LAB — LACTIC ACID, PLASMA: Lactic Acid, Venous: 1.1 mmol/L (ref 0.5–1.9)

## 2016-05-04 LAB — CBG MONITORING, ED
GLUCOSE-CAPILLARY: 142 mg/dL — AB (ref 65–99)
GLUCOSE-CAPILLARY: 164 mg/dL — AB (ref 65–99)

## 2016-05-04 LAB — BASIC METABOLIC PANEL
Anion gap: 8 (ref 5–15)
BUN: 36 mg/dL — ABNORMAL HIGH (ref 6–20)
CO2: 21 mmol/L — ABNORMAL LOW (ref 22–32)
Calcium: 9.1 mg/dL (ref 8.9–10.3)
Chloride: 104 mmol/L (ref 101–111)
Creatinine, Ser: 1.72 mg/dL — ABNORMAL HIGH (ref 0.44–1.00)
GFR calc Af Amer: 34 mL/min — ABNORMAL LOW (ref >60)
GFR calc non Af Amer: 29 mL/min — ABNORMAL LOW (ref >60)
Glucose, Bld: 155 mg/dL — ABNORMAL HIGH (ref 65–99)
Potassium: 5.1 mmol/L (ref 3.5–5.1)
Sodium: 133 mmol/L — ABNORMAL LOW (ref 135–145)

## 2016-05-04 MED ORDER — CLINDAMYCIN HCL 300 MG PO CAPS: 300.0000 mg | ORAL_CAPSULE | Freq: Three times a day (TID) | ORAL | 0 refills | Status: DC

## 2016-05-04 MED ORDER — VANCOMYCIN HCL IN DEXTROSE 1-5 GM/200ML-% IV SOLN
1000.0000 mg | Freq: Once | INTRAVENOUS | Status: AC
  Administered 2016-05-04: 1000 mg via INTRAVENOUS
  Filled 2016-05-04: qty 200

## 2016-05-04 MED ORDER — SODIUM CHLORIDE 0.9 % IV BOLUS (SEPSIS)
1000.0000 mL | Freq: Once | INTRAVENOUS | Status: AC
  Administered 2016-05-04: 1000 mL via INTRAVENOUS

## 2016-05-04 NOTE — ED Notes (Signed)
Patient demanding that she needed her Blood sugar checked and something to eat because all she has had to eat was a tangerine this morning. Patient offered Peanut butter nabs states that she is allergic to cheese, patient offered frozen meal. Patient on call light stating that her food is not being brought to her fast enough. Patient eating at this time, able to feed herself.

## 2016-05-04 NOTE — ED Provider Notes (Signed)
[facility] DEPT Provider Note   CSN: [id number] Arrival date & time: 05/04/16  1700 By signing my name below, I, [name], attest that this documentation has been prepared under the direction and in the presence of [name] Manifold, MD. Electronically Signed: Georgette [name], ED Scribe. 05/04/16. 5:59 PM.  History   Chief Complaint Chief Complaint  Patient presents with  . Foot Pain   HPI Comments: [name] is a 70 y.o. female with h/o arthritis, CKD, CAD, gout, HTN, and DM who presents to the Emergency Department complaining of 6/10 right foot pain with redness onset 3 days ago. Pt notes there is also an open wound that is draining by her great toe. Denies any recent injury or trauma. Pt has h/o gout and is concerned that it is what may be causing her symptoms at this time. She has not tried any OTC medications PTA. She denies fever, chills, or any other associated symptoms.   The history is provided by the patient. No language interpreter was used.    Past Medical History:  Diagnosis Date  . Anemia in chronic renal disease    Aranesp injection --  when Hg <11, last injection 12-24-14.  Marland Kitchen Anxiety   . Arthritis    knees and hand/fingers. "broke back"-being evaluated for this"weakness left leg"  . CKD (chronic kidney disease), stage III    nephrologist--  dr [name]  . Complication of anesthesia    post-op confusion   . Constipation   . Coronary artery disease   . Diabetic gastroparesis ([place])   . Diabetic retinopathy ([place])    bilateral --  monitored by dr [name]  . Diverticulosis of colon   . GAD (generalized anxiety disorder)   . GERD (gastroesophageal reflux disease)   . History of colon polyps    benign  . History of esophagitis   . History of GI bleed    upper 2009  due to esophagitis  &  2002  due to Mallory-Weiss tear  . History of gout    in issues for several years  . 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 History of rectal abscess    12-29-2004  bedside I & D  . Hyperlipidemia   . Hypertension   . Hypothyroidism   . LAFB (left anterior fascicular block)   . Right bundle branch block   . Sacral decubitus ulcer    since 2014- 01-02-15 remains with wound" gauze dressing changes daily.  . Type II diabetes mellitus [facility])     Patient Active Problem List   Diagnosis Date Noted  . Hyperkalemia 06/28/2015  . Nausea and vomiting 01/23/2015  . Acute encephalopathy 01/23/2015  . CKD (chronic kidney disease), stage III   . Anemia in chronic renal disease   . Gastroesophageal reflux disease without esophagitis 10/11/2014  . Hypothyroidism 05/18/2014  . Hip fracture requiring operative repair ([place])   . Fall   . Osteoporosis 09/06/2013  . Type 2 diabetes mellitus with stage 4 chronic kidney disease ([place]) 07/20/2013  . Edema 03/03/2011  . Diabetic retinopathy ([place]) 10/28/2007  . Hyperlipidemia 10/28/2007  . Anxiety state 10/28/2007  . Depression 10/28/2007  . Essential hypertension 10/28/2007  . Disorder resulting from impaired renal function 10/28/2007  . COLONIC POLYPS, HYPERPLASTIC 11/17/2006  . DIVERTICULOSIS, COLON 11/17/2006    Past Surgical History:  Procedure Laterality Date  . APPLICATION OF A-CELL OF EXTREMITY N/A 04/07/2015   Procedure: A CELL PLACMENT;  Surgeon: [name] Lofty  Dillingham, DO;  Location: [place];  Service: Plastics;  Laterality: N/A;  . CARDIOVASCULAR STRESS TEST  12-30-2004   normal perfusion study/  no ischemia or infartion/  normal LV wall function and wall motion , ef 66%  . CATARACT EXTRACTION W/ INTRAOCULAR LENS  IMPLANT, BILATERAL  1995  . COLONOSCOPY W/ POLYPECTOMY  last one 2008  . COMPRESSION HIP SCREW Right 05/18/2014   Procedure: COMPRESSION HIP;  Surgeon: Carole Civil, MD;  Location: [facility];  Service: Orthopedics;  Laterality: Right;  . ESOPHAGOGASTRODUODENOSCOPY  last one 01-09-2011  . EVALUATION UNDER ANESTHESIA WITH FISTULECTOMY N/A 01/06/2015   Procedure: EXAM UNDER  ANESTHESIA , placement of seton;  Surgeon: [name] Confer, MD;  Location: [facility];  Service: General;  Laterality: N/A;  . I&D EXTREMITY N/A 04/07/2015   Procedure: IRRIGATION AND DEBRIDEMENT ISCHIAL ULCER;  Surgeon: [name] Lofty Dillingham, DO;  Location: [place];  Service: Plastics;  Laterality: N/A;  . INCISION AND DRAINAGE OF WOUND N/A 09/30/2014   Procedure: IRRIGATION AND DEBRIDEMENT SACRAL WOUND, EXCISION OF PERIRECTAL TRACT WITH PLACEMENT OF ACCELL;  Surgeon: [name] Kos, DO;  Location: [place];  Service: Plastics;  Laterality: N/A;  . ORIF FEMUR FRACTURE Left 10/09/2012   Procedure: OPEN REDUCTION INTERNAL FIXATION (ORIF) DISTAL FEMUR FRACTURE;  Surgeon: [name] Box, MD;  Location: [place];  Service: Orthopedics;  Laterality: Left;  . RETINOPATHY SURGERY Bilateral 1980's?  . TRANSTHORACIC ECHOCARDIOGRAM  02-18-2011   mild LVH,  ef 55-60%,  grade I diastolic dysfunction/  mild TR/  RV systolic pressure increased consistant with moderate pulmonary hypertension    OB History    Gravida Para Term Preterm AB Living             0   SAB TAB Ectopic Multiple Live Births                   Home Medications    Prior to Admission medications   Medication Sig Start Date End Date Taking? Authorizing Provider  amLODipine (NORVASC) 5 MG tablet Take 1 tablet (5 mg total) by mouth daily. 03/05/16   [name] Maize, MD  atorvastatin (LIPITOR) 40 MG tablet Take 1 tablet (40 mg total) by mouth every evening. 03/05/16   [name] Maize, MD  Blood Glucose Monitoring Suppl (ACCU-CHEK AVIVA PLUS) w/Device KIT Use as directed 4 times daily 07/21/15   [name] Anger, MD  glucose blood (ACCU-CHEK AVIVA) test strip Use as instructed 4 x daily. E11.65 04/19/16   [name] Anger, MD  insulin aspart protamine - aspart (NOVOLOG MIX 70/30 FLEXPEN) (70-30) 100 UNIT/ML FlexPen Inject 0.08 mLs (8 Units total) into the skin daily with breakfast. Only if blood glucose is above 90 09/11/15    [name] Anger, MD  Insulin Pen Needle (UNIFINE PENTIPS) 32G X 4 MM MISC Use as directed bid. E11.65 04/19/16   [name] Anger, MD  Lancets (ACCU-CHEK SOFT TOUCH) lancets Use as instructed 4 x daily, E11.65 04/19/16   [name] Anger, MD  levothyroxine (SYNTHROID, LEVOTHROID) 75 MCG tablet Take 1 tablet (75 mcg total) by mouth daily before breakfast. 03/05/16   [name] Maize, MD  losartan (COZAAR) 100 MG tablet Take 1 tablet (100 mg total) by mouth daily. 03/05/16   [name] Maize, MD  metoprolol succinate (TOPROL-XL) 50 MG 24 hr tablet Take 1 tablet (50 mg total) by mouth every morning. 03/05/16   [name] Maize, MD  NOVOLOG MIX 70/30 FLEXPEN (70-30) 100  UNIT/ML FlexPen INJECT 15 UNITS (0.15ML) TWICE DAILY 03/02/16   [name] Anger, MD  pantoprazole (PROTONIX) 20 MG tablet Take 1 tablet (20 mg total) by mouth daily. 03/05/16   [name] Maize, MD  UNIFINE PENTIPS 32G X 4 MM MISC  05/14/15   Historical Provider, MD    Family History Family History  Problem Relation Age of Onset  . Colon cancer Father   . Prostate cancer Father   . Diabetes Father   . Coronary artery disease Mother 63  . Heart disease Mother   . Diabetes Mother   . Coronary artery disease Sister 47  . Diabetes Sister   . Heart disease Sister   . Diabetes Sister   . Diabetes Sister   . Diabetes Sister   . Diabetes Sister   . Diabetes Sister   . Diabetes Maternal Grandmother     Social History Social History  Substance Use Topics  . Smoking status: Never Smoker  . Smokeless tobacco: Never Used  . Alcohol use No     Allergies   Aspirin; Ciprofloxacin; Codeine; Micardis [telmisartan]; Nexium [esomeprazole magnesium]; Niaspan [niacin er]; Onglyza [saxagliptin]; Other; Rofecoxib; Simvastatin; Tequin [gatifloxacin]; Welchol [colesevelam hcl]; and Amoxicillin   Review of Systems Review of Systems  Constitutional: Negative for chills and fever.  Musculoskeletal: Positive for arthralgias  and myalgias.  Skin: Positive for color change and wound.  All other systems reviewed and are negative.    Physical Exam Updated Vital Signs BP (!) 99/48 (BP Location: Left Arm)   Pulse 67   Temp 99 F (37.2 C) (Oral)   Resp 16   Ht '5\' 2"'  (1.575 m)   Wt 159 lb (72.1 kg)   SpO2 100%   BMI 29.08 kg/m   Physical Exam  Constitutional: She is oriented to person, place, and time. She appears well-developed and well-nourished. No distress.  HENT:  Head: Normocephalic and atraumatic.  Nose: Nose normal.  Mouth/Throat: Oropharynx is clear and moist. No oropharyngeal exudate.  Eyes: Conjunctivae and EOM are normal. Pupils are equal, round, and reactive to light. No scleral icterus.  Neck: Normal range of motion. Neck supple. No JVD present. No tracheal deviation present. No thyromegaly present.  Cardiovascular: Normal rate, regular rhythm and normal heart sounds.  Exam reveals no gallop and no friction rub.   No murmur heard. Pulmonary/Chest: Effort normal and breath sounds normal. No respiratory distress. She has no wheezes. She exhibits no tenderness.  Abdominal: Soft. Bowel sounds are normal. She exhibits no distension and no mass. There is no tenderness. There is no rebound and no guarding.  Musculoskeletal: Normal range of motion. She exhibits edema. She exhibits no tenderness.  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 There is a small ulceration with both pus and tophaceous material. Mild redness, swelling, and erythema extending to mid foot.  Lymphadenopathy:    She has no cervical adenopathy.  Neurological: She is alert and oriented to person, place, and time. No cranial nerve deficit. She exhibits normal muscle tone.  Skin: Skin is warm and dry. No rash noted. No erythema. No pallor.  Nursing note and vitals reviewed.    ED Treatments / Results  DIAGNOSTIC STUDIES: Oxygen Saturation is 100% on RA, normal by my interpretation.    COORDINATION OF CARE: 5:59 PM Discussed  treatment plan with pt at bedside which includes antibiotics Rx and pt agreed to plan.  Labs (all labs ordered are listed, but only abnormal results are displayed) Labs Reviewed  CBG MONITORING,  ED - Abnormal; Notable for the following:       Result Value   Glucose-Capillary 142 (*)    All other components within normal limits    EKG  EKG Interpretation None       Radiology No results found.  Procedures Procedures (including critical care time)  Medications Ordered in ED Medications - No data to display   Initial Impression / Assessment and Plan / ED Course  I have reviewed the triage vital signs and the nursing notes.  Pertinent labs & imaging results that were available during my care of the patient were reviewed by me and considered in my medical decision making (see chart for details).  Clinical Course     70 year old female with history of gout. Increasing pain, redness and some drainage here her first MP joint right foot. There is with purulent material and tophaceous material in the wound. There is some mild cellulitis extending up her foot. Overall, she is nontoxic. I feel she is appropriate for outpatient treatment. Continued wound care return precautions were discussed.  Final Clinical Impressions(s) / ED Diagnoses   Final diagnoses:  Cellulitis of foot  Tophaceous gout    New Prescriptions New Prescriptions   No medications on file   I personally preformed the services scribed in my presence. The recorded information has been reviewed is accurate. [name] Manifold, MD.      [name] Manifold, MD 05/11/16 [phone]

## 2016-05-04 NOTE — ED Notes (Signed)
cbg of 142.

## 2016-05-04 NOTE — ED Notes (Signed)
Per pt's husband pt has a wound to her sacrum that has been there for approx 9 years. Husband is cleaning and packing the wound. Pt being seen by the [place].

## 2016-05-04 NOTE — ED Triage Notes (Signed)
Pt reports R foot pain that started approx 3 days ago. Pt states she has a hx of gout and thinks that is what is causing the problem. When bandage removed pt has an open wound to the medial side of the great toe. Foot is reddened.

## 2016-05-05 ENCOUNTER — Inpatient Hospital Stay (HOSPITAL_COMMUNITY)
Admission: EM | Admit: 2016-05-05 | Discharge: 2016-05-08 | DRG: 638 | Disposition: A | Discharge status: Home Health | Payer: Medicare Other | Attending: Internal Medicine | Admitting: Internal Medicine

## 2016-05-05 ENCOUNTER — Encounter (HOSPITAL_COMMUNITY): Payer: Self-pay | Admitting: Emergency Medicine

## 2016-05-05 ENCOUNTER — Emergency Department (HOSPITAL_COMMUNITY): Payer: Medicare Other

## 2016-05-05 DIAGNOSIS — E1169 Type 2 diabetes mellitus with other specified complication: Secondary | ICD-10-CM

## 2016-05-05 DIAGNOSIS — E11319 Type 2 diabetes mellitus with unspecified diabetic retinopathy without macular edema: Secondary | ICD-10-CM | POA: Diagnosis present

## 2016-05-05 DIAGNOSIS — M1A9XX1 Chronic gout, unspecified, with tophus (tophi): Secondary | ICD-10-CM | POA: Diagnosis present

## 2016-05-05 DIAGNOSIS — N184 Chronic kidney disease, stage 4 (severe): Secondary | ICD-10-CM

## 2016-05-05 DIAGNOSIS — Z79899 Other long term (current) drug therapy: Secondary | ICD-10-CM

## 2016-05-05 DIAGNOSIS — E11628 Type 2 diabetes mellitus with other skin complications: Secondary | ICD-10-CM | POA: Diagnosis present

## 2016-05-05 DIAGNOSIS — L97509 Non-pressure chronic ulcer of other part of unspecified foot with unspecified severity: Secondary | ICD-10-CM

## 2016-05-05 DIAGNOSIS — I129 Hypertensive chronic kidney disease with stage 1 through stage 4 chronic kidney disease, or unspecified chronic kidney disease: Secondary | ICD-10-CM | POA: Diagnosis present

## 2016-05-05 DIAGNOSIS — Z794 Long term (current) use of insulin: Secondary | ICD-10-CM

## 2016-05-05 DIAGNOSIS — K604 Rectal fistula: Secondary | ICD-10-CM | POA: Diagnosis not present

## 2016-05-05 DIAGNOSIS — N183 Chronic kidney disease, stage 3 unspecified: Secondary | ICD-10-CM | POA: Diagnosis present

## 2016-05-05 DIAGNOSIS — D631 Anemia in chronic kidney disease: Secondary | ICD-10-CM | POA: Diagnosis present

## 2016-05-05 DIAGNOSIS — L0390 Cellulitis, unspecified: Secondary | ICD-10-CM | POA: Diagnosis present

## 2016-05-05 DIAGNOSIS — M7989 Other specified soft tissue disorders: Secondary | ICD-10-CM | POA: Diagnosis not present

## 2016-05-05 DIAGNOSIS — N179 Acute kidney failure, unspecified: Secondary | ICD-10-CM | POA: Diagnosis present

## 2016-05-05 DIAGNOSIS — E1143 Type 2 diabetes mellitus with diabetic autonomic (poly)neuropathy: Secondary | ICD-10-CM | POA: Diagnosis present

## 2016-05-05 DIAGNOSIS — E039 Hypothyroidism, unspecified: Secondary | ICD-10-CM

## 2016-05-05 DIAGNOSIS — L089 Local infection of the skin and subcutaneous tissue, unspecified: Secondary | ICD-10-CM

## 2016-05-05 DIAGNOSIS — L03031 Cellulitis of right toe: Secondary | ICD-10-CM | POA: Diagnosis not present

## 2016-05-05 DIAGNOSIS — L03115 Cellulitis of right lower limb: Secondary | ICD-10-CM | POA: Diagnosis not present

## 2016-05-05 DIAGNOSIS — Z86010 Personal history of colonic polyps: Secondary | ICD-10-CM

## 2016-05-05 DIAGNOSIS — E785 Hyperlipidemia, unspecified: Secondary | ICD-10-CM | POA: Diagnosis present

## 2016-05-05 DIAGNOSIS — R7881 Bacteremia: Secondary | ICD-10-CM | POA: Diagnosis present

## 2016-05-05 DIAGNOSIS — E11649 Type 2 diabetes mellitus with hypoglycemia without coma: Secondary | ICD-10-CM | POA: Diagnosis not present

## 2016-05-05 DIAGNOSIS — K3184 Gastroparesis: Secondary | ICD-10-CM | POA: Diagnosis present

## 2016-05-05 DIAGNOSIS — Z800 Family history of malignant neoplasm of digestive organs: Secondary | ICD-10-CM

## 2016-05-05 DIAGNOSIS — I2510 Atherosclerotic heart disease of native coronary artery without angina pectoris: Secondary | ICD-10-CM | POA: Diagnosis present

## 2016-05-05 DIAGNOSIS — E11621 Type 2 diabetes mellitus with foot ulcer: Principal | ICD-10-CM | POA: Diagnosis present

## 2016-05-05 DIAGNOSIS — K219 Gastro-esophageal reflux disease without esophagitis: Secondary | ICD-10-CM | POA: Diagnosis present

## 2016-05-05 DIAGNOSIS — R7889 Finding of other specified substances, not normally found in blood: Secondary | ICD-10-CM | POA: Diagnosis not present

## 2016-05-05 DIAGNOSIS — I130 Hypertensive heart and chronic kidney disease with heart failure and stage 1 through stage 4 chronic kidney disease, or unspecified chronic kidney disease: Secondary | ICD-10-CM | POA: Diagnosis not present

## 2016-05-05 DIAGNOSIS — B9561 Methicillin susceptible Staphylococcus aureus infection as the cause of diseases classified elsewhere: Secondary | ICD-10-CM | POA: Diagnosis present

## 2016-05-05 DIAGNOSIS — L97519 Non-pressure chronic ulcer of other part of right foot with unspecified severity: Secondary | ICD-10-CM | POA: Diagnosis present

## 2016-05-05 DIAGNOSIS — R0989 Other specified symptoms and signs involving the circulatory and respiratory systems: Secondary | ICD-10-CM

## 2016-05-05 DIAGNOSIS — E1122 Type 2 diabetes mellitus with diabetic chronic kidney disease: Secondary | ICD-10-CM | POA: Diagnosis not present

## 2016-05-05 DIAGNOSIS — Z8249 Family history of ischemic heart disease and other diseases of the circulatory system: Secondary | ICD-10-CM

## 2016-05-05 DIAGNOSIS — Z833 Family history of diabetes mellitus: Secondary | ICD-10-CM

## 2016-05-05 LAB — CBC
HCT: 34.3 % — ABNORMAL LOW (ref 36.0–46.0)
Hemoglobin: 11.2 g/dL — ABNORMAL LOW (ref 12.0–15.0)
MCH: 29.6 pg (ref 26.0–34.0)
MCHC: 32.7 g/dL (ref 30.0–36.0)
MCV: 90.5 fL (ref 78.0–100.0)
Platelets: 213 10*3/uL (ref 150–400)
RBC: 3.79 MIL/uL — ABNORMAL LOW (ref 3.87–5.11)
RDW: 12.6 % (ref 11.5–15.5)
WBC: 10.7 10*3/uL — ABNORMAL HIGH (ref 4.0–10.5)

## 2016-05-05 LAB — BASIC METABOLIC PANEL WITH GFR
Anion gap: 8 (ref 5–15)
BUN: 34 mg/dL — ABNORMAL HIGH (ref 6–20)
CO2: 20 mmol/L — ABNORMAL LOW (ref 22–32)
Calcium: 8.5 mg/dL — ABNORMAL LOW (ref 8.9–10.3)
Chloride: 104 mmol/L (ref 101–111)
Creatinine, Ser: 1.65 mg/dL — ABNORMAL HIGH (ref 0.44–1.00)
GFR calc Af Amer: 35 mL/min — ABNORMAL LOW
GFR calc non Af Amer: 30 mL/min — ABNORMAL LOW
Glucose, Bld: 330 mg/dL — ABNORMAL HIGH (ref 65–99)
Potassium: 5.1 mmol/L (ref 3.5–5.1)
Sodium: 132 mmol/L — ABNORMAL LOW (ref 135–145)

## 2016-05-05 LAB — CBG MONITORING, ED
Glucose-Capillary: 260 mg/dL — ABNORMAL HIGH (ref 65–99)
Glucose-Capillary: 254 mg/dL — ABNORMAL HIGH (ref 65–99)

## 2016-05-05 MED ORDER — INSULIN ASPART 100 UNIT/ML ~~LOC~~ SOLN
8.0000 [IU] | Freq: Once | SUBCUTANEOUS | Status: AC
  Administered 2016-05-05: 8 [IU] via SUBCUTANEOUS
  Filled 2016-05-05: qty 1

## 2016-05-05 MED ORDER — AMLODIPINE BESYLATE 5 MG PO TABS
5.0000 mg | ORAL_TABLET | Freq: Every day | ORAL | Status: DC
  Administered 2016-05-06 – 2016-05-08 (×3): 5 mg via ORAL
  Filled 2016-05-05 (×3): qty 1

## 2016-05-05 MED ORDER — ACETAMINOPHEN 650 MG RE SUPP: 650.0000 mg | Freq: Four times a day (QID) | RECTAL | Status: DC | PRN

## 2016-05-05 MED ORDER — LEVOTHYROXINE SODIUM 75 MCG PO TABS
75.0000 ug | ORAL_TABLET | Freq: Every day | ORAL | Status: DC
  Administered 2016-05-06 – 2016-05-08 (×3): 75 ug via ORAL
  Filled 2016-05-05 (×3): qty 1

## 2016-05-05 MED ORDER — ONDANSETRON HCL 4 MG PO TABS: 4.0000 mg | ORAL_TABLET | Freq: Four times a day (QID) | ORAL | Status: DC | PRN

## 2016-05-05 MED ORDER — METOPROLOL SUCCINATE ER 50 MG PO TB24
50.0000 mg | ORAL_TABLET | Freq: Every morning | ORAL | Status: DC
  Administered 2016-05-06 – 2016-05-08 (×3): 50 mg via ORAL
  Filled 2016-05-05 (×3): qty 1

## 2016-05-05 MED ORDER — ONDANSETRON HCL 4 MG/2ML IJ SOLN: 4.0000 mg | Freq: Four times a day (QID) | INTRAMUSCULAR | Status: DC | PRN

## 2016-05-05 MED ORDER — ATORVASTATIN CALCIUM 40 MG PO TABS
40.0000 mg | ORAL_TABLET | Freq: Every evening | ORAL | Status: DC
  Administered 2016-05-06 – 2016-05-07 (×2): 40 mg via ORAL
  Filled 2016-05-05 (×2): qty 1

## 2016-05-05 MED ORDER — DEXTROSE 5 % IV SOLN
1.0000 g | Freq: Once | INTRAVENOUS | Status: AC
  Administered 2016-05-05: 1 g via INTRAVENOUS
  Filled 2016-05-05: qty 10

## 2016-05-05 MED ORDER — SODIUM CHLORIDE 0.9 % IV SOLN
INTRAVENOUS | Status: DC
  Administered 2016-05-05 – 2016-05-08 (×3): via INTRAVENOUS

## 2016-05-05 MED ORDER — PANTOPRAZOLE SODIUM 20 MG PO TBEC
20.0000 mg | DELAYED_RELEASE_TABLET | Freq: Every day | ORAL | Status: DC
Start: 2016-05-06 — End: 2016-05-06
  Filled 2016-05-05 (×5): qty 1

## 2016-05-05 MED ORDER — ENOXAPARIN SODIUM 40 MG/0.4ML ~~LOC~~ SOLN
40.0000 mg | SUBCUTANEOUS | Status: DC
  Administered 2016-05-06 – 2016-05-08 (×3): 40 mg via SUBCUTANEOUS
  Filled 2016-05-05 (×4): qty 0.4

## 2016-05-05 MED ORDER — INSULIN ASPART 100 UNIT/ML ~~LOC~~ SOLN
0.0000 [IU] | Freq: Three times a day (TID) | SUBCUTANEOUS | Status: DC
  Administered 2016-05-06: 2 [IU] via SUBCUTANEOUS
  Administered 2016-05-06 – 2016-05-07 (×3): 5 [IU] via SUBCUTANEOUS
  Administered 2016-05-08: 3 [IU] via SUBCUTANEOUS
  Administered 2016-05-08: 1 [IU] via SUBCUTANEOUS

## 2016-05-05 MED ORDER — ACETAMINOPHEN 325 MG PO TABS: 650.0000 mg | ORAL_TABLET | Freq: Four times a day (QID) | ORAL | Status: DC | PRN

## 2016-05-05 NOTE — ED Notes (Signed)
Pt returned from xray

## 2016-05-05 NOTE — H&P (Signed)
TRH H&P    Patient Demographics:    [name], is a 70 y.o. female  MRN: [id number]  DOB - 07-18-1945  Admit Date - 05/05/2016  Referring MD/NP/PA: Dr Tomi Bamberger  Outpatient Primary MD for the patient is Eustaquio Maize, MD  Patient coming from: Home  Chief Complaint  Patient presents with  . Foot Pain      HPI:    [name]  is a 70 y.o. female, With history of gout, diabetes mellitus who came to the hospital with worsening redness and swelling of the MTP joint of right big toe for past few days. Patient was seen in the ED yesterday and diagnosed with cellulitis, along with gouty arthritis. Patient was sent home after drawing blood cultures. Patient was called back to the ED, as blood cultures are growing gram-positive cocci in 2 out of 2 bottles. Patient denies any fever or chills. She denies pain but has noticed increased drainage from the foot wound. She denies chest pain or shortness of breath. No nausea vomiting or diarrhea.  In the ED, patient started on ceftriaxone.  Repeat blood cultures obtained   Review of systems:    In addition to the HPI above,  No Fever-chills, No Headache, No changes with Vision or hearing, No problems swallowing food or Liquids, No Chest pain, Cough or Shortness of Breath, No Abdominal pain, No Nausea or Vomiting, bowel movements are regular, No Blood in stool or Urine, No dysuria, No new skin rashes or bruises, No new joints pains-aches,  No new weakness, tingling, numbness in any extremity, No recent weight gain or loss, No polyuria, polydypsia or polyphagia, No significant Mental Stressors.  A full 10 point Review of Systems was done, except as stated above, all other Review of Systems were negative.   With Past History of the following :    Past Medical History:  Diagnosis Date  . Anemia in chronic renal disease    Aranesp injection --  when Hg  <11, last injection 12-24-14.  Marland Kitchen Anxiety   . Arthritis    knees and hand/fingers. "broke back"-being evaluated for this"weakness left leg"  . CKD (chronic kidney disease), stage III    nephrologist--  dr Mercy Moore  . Complication of anesthesia    post-op confusion   . Constipation   . Coronary artery disease   . Diabetic gastroparesis ([place])   . Diabetic retinopathy ([place])    bilateral --  monitored by dr [name]  . Diverticulosis of colon   . GAD (generalized anxiety disorder)   . GERD (gastroesophageal reflux disease)   . History of colon polyps    benign  . History of esophagitis   . History of GI bleed    upper 2009  due to esophagitis  &  2002  due to Mallory-Weiss tear  . History of gout    in issues for several years  . 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 History of rectal abscess    12-29-2004  bedside I & D  .  Hyperlipidemia   . Hypertension   . Hypothyroidism   . LAFB (left anterior fascicular block)   . Right bundle branch block   . Sacral decubitus ulcer    since 2014- 01-02-15 remains with wound" gauze dressing changes daily.  . Type II diabetes mellitus ([place])       Past Surgical History:  Procedure Laterality Date  . APPLICATION OF A-CELL OF EXTREMITY N/A 04/07/2015   Procedure: A CELL PLACMENT;  Surgeon: Loel Lofty Dillingham, DO;  Location: [facility];  Service: Plastics;  Laterality: N/A;  . CARDIOVASCULAR STRESS TEST  12-30-2004   normal perfusion study/  no ischemia or infartion/  normal LV wall function and wall motion , ef 66%  . CATARACT EXTRACTION W/ INTRAOCULAR LENS  IMPLANT, BILATERAL  1995  . COLONOSCOPY W/ POLYPECTOMY  last one 2008  . COMPRESSION HIP SCREW Right 05/18/2014   Procedure: COMPRESSION HIP;  Surgeon: Carole Civil, MD;  Location: [facility];  Service: Orthopedics;  Laterality: Right;  . ESOPHAGOGASTRODUODENOSCOPY  last one 01-09-2011  . EVALUATION UNDER ANESTHESIA WITH FISTULECTOMY N/A 01/06/2015   Procedure: EXAM  UNDER ANESTHESIA , placement of seton;  Surgeon: Jackolyn Confer, MD;  Location: [facility];  Service: General;  Laterality: N/A;  . I&D EXTREMITY N/A 04/07/2015   Procedure: IRRIGATION AND DEBRIDEMENT ISCHIAL ULCER;  Surgeon: Loel Lofty Dillingham, DO;  Location: [facility];  Service: Plastics;  Laterality: N/A;  . INCISION AND DRAINAGE OF WOUND N/A 09/30/2014   Procedure: IRRIGATION AND DEBRIDEMENT SACRAL WOUND, EXCISION OF PERIRECTAL TRACT WITH PLACEMENT OF ACCELL;  Surgeon: Theodoro Kos, DO;  Location: [facility];  Service: Plastics;  Laterality: N/A;  . ORIF FEMUR FRACTURE Left 10/09/2012   Procedure: OPEN REDUCTION INTERNAL FIXATION (ORIF) DISTAL FEMUR FRACTURE;  Surgeon: Rozanna Box, MD;  Location: [facility];  Service: Orthopedics;  Laterality: Left;  . RETINOPATHY SURGERY Bilateral 1980's?  . TRANSTHORACIC ECHOCARDIOGRAM  02-18-2011   mild LVH,  ef 55-60%,  grade I diastolic dysfunction/  mild TR/  RV systolic pressure increased consistant with moderate pulmonary hypertension      Social History:      Social History  Substance Use Topics  . Smoking status: Never Smoker  . Smokeless tobacco: Never Used  . Alcohol use No       Family History :     Family History  Problem Relation Age of Onset  . Colon cancer Father   . Prostate cancer Father   . Diabetes Father   . Coronary artery disease Mother 39  . Heart disease Mother   . Diabetes Mother   . Coronary artery disease Sister 71  . Diabetes Sister   . Heart disease Sister   . Diabetes Sister   . Diabetes Sister   . Diabetes Sister   . Diabetes Sister   . Diabetes Sister   . Diabetes Maternal Grandmother       Home Medications:   Prior to Admission medications   Medication Sig Start Date End Date Taking? Authorizing Provider  amLODipine (NORVASC) 5 MG tablet Take 1 tablet (5 mg total) by mouth daily. 03/05/16  Yes Eustaquio Maize, MD  amLODipine (NORVASC) 5 MG tablet Take 5 mg by mouth daily.   Yes  Historical Provider, MD  atorvastatin (LIPITOR) 40 MG tablet Take 1 tablet (40 mg total) by mouth every evening. 03/05/16  Yes Eustaquio Maize, MD  clindamycin (CLEOCIN) 300 MG capsule Take 1 capsule (300 mg total) by mouth 3 (three)  times daily. 05/04/16  Yes Virgel Manifold, MD  insulin aspart protamine - aspart (NOVOLOG MIX 70/30 FLEXPEN) (70-30) 100 UNIT/ML FlexPen Inject 0.08 mLs (8 Units total) into the skin daily with breakfast. Only if blood glucose is above 90 09/11/15  Yes Cassandria Anger, MD  levothyroxine (SYNTHROID, LEVOTHROID) 75 MCG tablet Take 1 tablet (75 mcg total) by mouth daily before breakfast. 03/05/16  Yes Eustaquio Maize, MD  metoprolol succinate (TOPROL-XL) 50 MG 24 hr tablet Take 1 tablet (50 mg total) by mouth every morning. 03/05/16  Yes Eustaquio Maize, MD  Blood Glucose Monitoring Suppl (ACCU-CHEK AVIVA PLUS) w/Device KIT Use as directed 4 times daily 07/21/15   Cassandria Anger, MD  glucose blood (ACCU-CHEK AVIVA) test strip Use as instructed 4 x daily. E11.65 04/19/16   Cassandria Anger, MD  Insulin Pen Needle (UNIFINE PENTIPS) 32G X 4 MM MISC Use as directed bid. E11.65 04/19/16   Cassandria Anger, MD  Lancets (ACCU-CHEK SOFT TOUCH) lancets Use as instructed 4 x daily, E11.65 04/19/16   Cassandria Anger, MD  losartan (COZAAR) 100 MG tablet Take 1 tablet (100 mg total) by mouth daily. Patient not taking: Reported on 05/04/2016 03/05/16   Eustaquio Maize, MD  pantoprazole (PROTONIX) 20 MG tablet Take 1 tablet (20 mg total) by mouth daily. Patient not taking: Reported on 05/04/2016 03/05/16   Eustaquio Maize, MD  UNIFINE PENTIPS 32G X 4 MM MISC  05/14/15   Historical Provider, MD     Allergies:     Allergies  Allergen Reactions  . Aspirin Nausea And Vomiting  . Ciprofloxacin Other (See Comments)    Upset Stomach  . Codeine Nausea And Vomiting    Makes me sick   . Micardis [Telmisartan] Other (See Comments)    unknown  . Nexium [Esomeprazole  Magnesium] Other (See Comments)    Causes internal bleeding  . Niaspan [Niacin Er] Other (See Comments)    Reaction is unknown  . Onglyza [Saxagliptin] Other (See Comments)    Reaction is unknown  . Other     No otc pain medications  . Rofecoxib Other (See Comments)    unknown  . Simvastatin   . Tequin [Gatifloxacin] Other (See Comments)    Reaction is unknown  . Welchol [Colesevelam Hcl]   . Amoxicillin Rash     Physical Exam:   Vitals  Blood pressure 115/78, pulse 64, temperature 98.2 F (36.8 C), temperature source Oral, resp. rate 16, height _0  (1.575 m), weight 72.1 kg (159 lb), SpO2 100 %.  1.  General: Appears in no acute distress  2. Psychiatric:  Intact judgement and  insight, awake alert, oriented x 3.  3. Neurologic: No focal neurological deficits, all cranial nerves intact.Strength 5/5 all 4 extremities, sensation intact all 4 extremities, plantars down going.  4. Eyes :  anicteric sclerae, moist conjunctivae with no lid lag. PERRLA.  5. ENMT:  Oropharynx clear with moist mucous membranes and good dentition  6. Neck:  supple, no cervical lymphadenopathy appriciated, No thyromegaly  7. Respiratory : Normal respiratory effort, good air movement bilaterally,clear to  auscultation bilaterally  8. Cardiovascular : RRR, no gallops, rubs or murmurs, no leg edema  9. Gastrointestinal:  Positive bowel sounds, abdomen soft, non-tender to palpation,no hepatosplenomegaly, no rigidity or guarding       10. Skin:  Large ulcer noted at the medial aspect of right  big toe, with surrounding erythema and tenderness to palpation. Mild discharge noted  at the base of the ulcer.  11.Musculoskeletal:  Good muscle tone,  joints appear normal , no effusions,  normal range of motion    Data Review:    CBC  Recent Labs Lab 05/04/16 1854 05/05/16 1934  WBC 12.0* 10.7*  HGB 11.3* 11.2*  HCT 34.4* 34.3*  PLT 205 213  MCV 90.3 90.5  MCH 29.7 29.6  MCHC 32.8  32.7  RDW 12.6 12.6  LYMPHSABS 1.8  --   MONOABS 1.1*  --   EOSABS 0.1  --   BASOSABS 0.0  --    ------------------------------------------------------------------------------------------------------------------  Chemistries   Recent Labs Lab 05/04/16 1854 05/05/16 1934  NA 133* 132*  K 5.1 5.1  CL 104 104  CO2 21* 20*  GLUCOSE 155* 330*  BUN 36* 34*  CREATININE 1.72* 1.65*  CALCIUM 9.1 8.5*   ------------------------------------------------------------------------------------------------------------------  -----------------------------------------------------------  Recent Labs Lab 05/04/16 1720 05/04/16 2005 05/05/16 1758  GLUCAP 142* 164* 260*   Lipid Profile: No results for input(s): CHOL, HDL, LDLCALC, TRIG, CHOLHDL, LDLDIRECT in the last 72 hours. Thyroid Function Tests: No results for input(s): TSH, T4TOTAL, FREET4, T3FREE, THYROIDAB in the last 72 hours. Anemia Panel: No results for input(s): VITAMINB12, FOLATE, FERRITIN, TIBC, IRON, RETICCTPCT in the last 72 hours.  --------------------------------------------------------------------------------------------------------------- Urine analysis:    Component Value Date/Time   COLORURINE YELLOW 06/28/2015 2006   APPEARANCEUR HAZY (A) 06/28/2015 2006   LABSPEC 1.020 06/28/2015 2006   PHURINE 5.5 06/28/2015 2006   GLUCOSEU NEGATIVE 06/28/2015 2006   HGBUR TRACE (A) 06/28/2015 2006   BILIRUBINUR NEGATIVE 06/28/2015 2006   KETONESUR NEGATIVE 06/28/2015 2006   PROTEINUR NEGATIVE 06/28/2015 2006   UROBILINOGEN 0.2 02/26/2015 1302   NITRITE NEGATIVE 06/28/2015 2006   LEUKOCYTESUR TRACE (A) 06/28/2015 2006      Imaging Results:    Dg Foot Complete Right  Addendum Date: 05/05/2016   ADDENDUM REPORT: 05/05/2016 20:01 ADDENDUM: Mentioned in the body, but not mentioned in the impression is severe osteolysis of the third middle and distal phalanx which may be secondary to infection or gout. Electronically  Signed   By: Kathreen Devoid   On: 05/05/2016 20:01   Result Date: 05/05/2016 CLINICAL DATA:  Draining ulcer of the right great toe. EXAM: RIGHT FOOT COMPLETE - 3+ VIEW COMPARISON:  None. FINDINGS: No acute fracture or dislocation. Severe generalized osteopenia. Large periarticular erosion involving the lateral head of the fifth metatarsal as can be seen with crystalline arthropathy such as gout. Severe soft tissue swelling along the medial aspect of the first MTP joint. No definite erosive changes or bone destruction. Moderate osteoarthritis of the first MTP joint. Mild osteoarthritis of the first IP joint. 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 Severe degenerative changes of the tarsometatarsal joints throughout the right foot. Plantar calcaneal spur with calcification of the plantar fascia. Enthesopathic changes of the Achilles tendon insertion. Peripheral vascular atherosclerotic disease. IMPRESSION: 1. Severe soft tissue swelling along the medial aspect of the first MTP joint concerning for severe cellulitis. No definite erosive changes or bone destruction to suggest osteomyelitis. If there is further clinical concern, recommend MRI of the right foot. 2. Large periarticular erosion involving the lateral head of the fifth metatarsal as can be seen with crystalline arthropathy such as gout. Electronically Signed: By: Kathreen Devoid On: 05/05/2016 19:58       Assessment & Plan:    Active Problems:   Type 2 diabetes mellitus with stage 4 chronic kidney disease (HCC)   Hypothyroidism  CKD (chronic kidney disease), stage III   Cellulitis   Diabetic foot infection ([place])   1. Diabetic foot infection-with patient having history of diabetes mellitus, gram-positive cocci obtained from the wound culture as well as blood cultures. Patient was started on ceftriaxone per pharmacy consultation. Will await final culture and sensitivity. Will also obtain MRI of the foot to rule out underlying  osteomyelitis. 2. Hypothyroidism-continue Synthroid 3. Diabetes mellitus-start sliding scale insulin with NovoLog. 4. Acute on chronic kidney disease stage III-patient's baseline creatinine is around 1.4, today her creatinine is 1.65. Start IV normal saline. Will follow BMP in a.m.    DVT Prophylaxis-   Lovenox   AM Labs Ordered, also please review Full Orders  Family Communication: No family at bedside  Code Status:  Full code  Admission status: Observation  Time spent in minutes : 60 minutes   LAMA,GAGAN S M.D on 05/05/2016 at 9:59 PM  Between 7am to 7pm - Pager - [phone]. After 7pm go to [email or web address] - password [facility]  [facility] - Office  [phone]

## 2016-05-05 NOTE — ED Notes (Signed)
Notified pt of blood cultures and she is coming back to er for evaluation this evening.

## 2016-05-05 NOTE — ED Notes (Signed)
CRITICAL VALUE ALERT  Critical value received:  Blood culture positive for Gram positive cocci in anerobic and aerobic bottle  Date of notification:  05/05/2016  Time of notification:  1658  Critical value read back:Yes.    Nurse who received alert:  Lana Fish RN  MD notified (1st page):  Dr. Roderic Palau  Time of first page:  1658  MD notified (2nd page):  Time of second page:  Responding MD:  Dr. Roderic Palau  Time MD responded:  [phone]

## 2016-05-05 NOTE — ED Notes (Signed)
cbg of 260.

## 2016-05-05 NOTE — ED Triage Notes (Signed)
Pt back to day for positive bl cx from yesterdays visit. righ foot pain/gout/diabetic ulcer. A/o. Nad. No dsitress

## 2016-05-05 NOTE — ED Notes (Signed)
ED Provider at bedside. 

## 2016-05-05 NOTE — ED Notes (Signed)
Pt updated on plan of care,  

## 2016-05-05 NOTE — ED Provider Notes (Signed)
[name] Provider Note   CSN: [id number] Arrival date & time: 05/05/16  1735     History   Chief Complaint Chief Complaint  Patient presents with  . Foot Pain    HPI [name] is a 70 y.o. female.  HPI Patient started having redness and swelling around the MTP joint of her right great toe a few days ago. Patient has a history of diabetes, gout as well as other medical problems. She was seen in the emergency room yesterday. She was diagnosed with cellulitis. She also was diagnosed with gouty tophi. During her evaluation she had blood cultures. Patient was called back to the ED today because her blood cultures were positive for gram-positive cocci in both the anaerobic and aerobic bottle. Patient denies any fevers or chills. Her family did notice increasing drainage from the wound today when he removed the bandage. She does have persistent pain and swelling in her right foot focused around the right big toe and up towards the midfoot Past Medical History:  Diagnosis Date  . Anemia in chronic renal disease    Aranesp injection --  when Hg <11, last injection 12-24-14.  Marland Kitchen Anxiety   . Arthritis    knees and hand/fingers. "broke back"-being evaluated for this"weakness left leg"  . CKD (chronic kidney disease), stage III    nephrologist--  dr Mercy Moore  . Complication of anesthesia    post-op confusion   . Constipation   . Coronary artery disease   . Diabetic gastroparesis ([place])   . Diabetic retinopathy ([place])    bilateral --  monitored by dr [name]  . Diverticulosis of colon   . GAD (generalized anxiety disorder)   . GERD (gastroesophageal reflux disease)   . History of colon polyps    benign  . History of esophagitis   . History of GI bleed    upper 2009  due to esophagitis  &  2002  due to Mallory-Weiss tear  . History of gout    in issues for several years  . 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 History of rectal abscess    12-29-2004  bedside I & D  . Hyperlipidemia   . Hypertension   . Hypothyroidism   . LAFB (left anterior fascicular block)   . Right bundle branch block   . Sacral decubitus ulcer    since 2014- 01-02-15 remains with wound" gauze dressing changes daily.  . Type II diabetes mellitus [facility])     Patient Active Problem List   Diagnosis Date Noted  . Hyperkalemia 06/28/2015  . Nausea and vomiting 01/23/2015  . Acute encephalopathy 01/23/2015  . CKD (chronic kidney disease), stage III   . Anemia in chronic renal disease   . Gastroesophageal reflux disease without esophagitis 10/11/2014  . Hypothyroidism 05/18/2014  . Hip fracture requiring operative repair ([place])   . Fall   . Osteoporosis 09/06/2013  . Type 2 diabetes mellitus with stage 4 chronic kidney disease ([place]) 07/20/2013  . Edema 03/03/2011  . Diabetic retinopathy ([place]) 10/28/2007  . Hyperlipidemia 10/28/2007  . Anxiety state 10/28/2007  . Depression 10/28/2007  . Essential hypertension 10/28/2007  . Disorder resulting from impaired renal function 10/28/2007  . COLONIC POLYPS, HYPERPLASTIC 11/17/2006  . DIVERTICULOSIS, COLON 11/17/2006    Past Surgical History:  Procedure Laterality Date  . APPLICATION OF A-CELL OF EXTREMITY N/A 04/07/2015   Procedure: A CELL PLACMENT;  Surgeon: Loel Lofty Dillingham, DO;  Location: [facility]  OR;  Service: Clinical cytogeneticist;  Laterality: N/A;  . CARDIOVASCULAR STRESS TEST  12-30-2004   normal perfusion study/  no ischemia or infartion/  normal LV wall function and wall motion , ef 66%  . CATARACT EXTRACTION W/ INTRAOCULAR LENS  IMPLANT, BILATERAL  1995  . COLONOSCOPY W/ POLYPECTOMY  last one 2008  . COMPRESSION HIP SCREW Right 05/18/2014   Procedure: COMPRESSION HIP;  Surgeon: Carole Civil, MD;  Location: [facility] ORS;  Service: Orthopedics;  Laterality: Right;  . ESOPHAGOGASTRODUODENOSCOPY  last one 01-09-2011  . EVALUATION UNDER ANESTHESIA WITH FISTULECTOMY N/A 01/06/2015   Procedure: EXAM UNDER  ANESTHESIA , placement of seton;  Surgeon: Jackolyn Confer, MD;  Location: [facility] ORS;  Service: General;  Laterality: N/A;  . I&D EXTREMITY N/A 04/07/2015   Procedure: IRRIGATION AND DEBRIDEMENT ISCHIAL ULCER;  Surgeon: Loel Lofty Dillingham, DO;  Location: [place];  Service: Plastics;  Laterality: N/A;  . INCISION AND DRAINAGE OF WOUND N/A 09/30/2014   Procedure: IRRIGATION AND DEBRIDEMENT SACRAL WOUND, EXCISION OF PERIRECTAL TRACT WITH PLACEMENT OF ACCELL;  Surgeon: Theodoro Kos, DO;  Location: [place];  Service: Plastics;  Laterality: N/A;  . ORIF FEMUR FRACTURE Left 10/09/2012   Procedure: OPEN REDUCTION INTERNAL FIXATION (ORIF) DISTAL FEMUR FRACTURE;  Surgeon: Rozanna Box, MD;  Location: [place];  Service: Orthopedics;  Laterality: Left;  . RETINOPATHY SURGERY Bilateral 1980's?  . TRANSTHORACIC ECHOCARDIOGRAM  02-18-2011   mild LVH,  ef 55-60%,  grade I diastolic dysfunction/  mild TR/  RV systolic pressure increased consistant with moderate pulmonary hypertension    OB History    Gravida Para Term Preterm AB Living             0   SAB TAB Ectopic Multiple Live Births                   Home Medications    Prior to Admission medications   Medication Sig Start Date End Date Taking? Authorizing Provider  amLODipine (NORVASC) 5 MG tablet Take 1 tablet (5 mg total) by mouth daily. 03/05/16  Yes Eustaquio Maize, MD  amLODipine (NORVASC) 5 MG tablet Take 5 mg by mouth daily.   Yes Historical Provider, MD  atorvastatin (LIPITOR) 40 MG tablet Take 1 tablet (40 mg total) by mouth every evening. 03/05/16  Yes Eustaquio Maize, MD  clindamycin (CLEOCIN) 300 MG capsule Take 1 capsule (300 mg total) by mouth 3 (three) times daily. 05/04/16  Yes Virgel Manifold, MD  insulin aspart protamine - aspart (NOVOLOG MIX 70/30 FLEXPEN) (70-30) 100 UNIT/ML FlexPen Inject 0.08 mLs (8 Units total) into the skin daily with breakfast. Only if blood glucose is above 90 09/11/15  Yes Cassandria Anger,  MD  levothyroxine (SYNTHROID, LEVOTHROID) 75 MCG tablet Take 1 tablet (75 mcg total) by mouth daily before breakfast. 03/05/16  Yes Eustaquio Maize, MD  metoprolol succinate (TOPROL-XL) 50 MG 24 hr tablet Take 1 tablet (50 mg total) by mouth every morning. 03/05/16  Yes Eustaquio Maize, MD  Blood Glucose Monitoring Suppl (ACCU-CHEK AVIVA PLUS) w/Device KIT Use as directed 4 times daily 07/21/15   Cassandria Anger, MD  glucose blood (ACCU-CHEK AVIVA) test strip Use as instructed 4 x daily. E11.65 04/19/16   Cassandria Anger, MD  Insulin Pen Needle (UNIFINE PENTIPS) 32G X 4 MM MISC Use as directed bid. E11.65 04/19/16   Cassandria Anger, MD  Lancets (ACCU-CHEK SOFT TOUCH) lancets Use as instructed 4 x daily,  E11.65 04/19/16   Cassandria Anger, MD  losartan (COZAAR) 100 MG tablet Take 1 tablet (100 mg total) by mouth daily. Patient not taking: Reported on 05/04/2016 03/05/16   Eustaquio Maize, MD  pantoprazole (PROTONIX) 20 MG tablet Take 1 tablet (20 mg total) by mouth daily. Patient not taking: Reported on 05/04/2016 03/05/16   Eustaquio Maize, MD  UNIFINE PENTIPS 32G X 4 MM MISC  05/14/15   Historical Provider, MD    Family History Family History  Problem Relation Age of Onset  . Colon cancer Father   . Prostate cancer Father   . Diabetes Father   . Coronary artery disease Mother 63  . Heart disease Mother   . Diabetes Mother   . Coronary artery disease Sister 23  . Diabetes Sister   . Heart disease Sister   . Diabetes Sister   . Diabetes Sister   . Diabetes Sister   . Diabetes Sister   . Diabetes Sister   . Diabetes Maternal Grandmother     Social History Social History  Substance Use Topics  . Smoking status: Never Smoker  . Smokeless tobacco: Never Used  . Alcohol use No     Allergies   Aspirin; Ciprofloxacin; Codeine; Micardis [telmisartan]; Nexium [esomeprazole magnesium]; Niaspan [niacin er]; Onglyza [saxagliptin]; Other; Rofecoxib; Simvastatin; Tequin  [gatifloxacin]; Welchol [colesevelam hcl]; and Amoxicillin   Review of Systems Review of Systems  All other systems reviewed and are negative.    Physical Exam Updated Vital Signs BP 115/78 (BP Location: Right Arm)   Pulse 64   Temp 98.2 F (36.8 C) (Oral)   Resp 16   Ht _0  (1.575 m)   Wt 72.1 kg   SpO2 100%   BMI 29.08 kg/m   Physical Exam  Constitutional: She appears well-developed and well-nourished. No distress.  HENT:  Head: Normocephalic and atraumatic.  Right Ear: External ear normal.  Left Ear: External ear normal.  Eyes: Conjunctivae are normal. Right eye exhibits no discharge. Left eye exhibits no discharge. No scleral icterus.  Neck: Neck supple. No tracheal deviation present.  Cardiovascular: Normal rate and regular rhythm.   Pulmonary/Chest: Effort normal and breath sounds normal. No stridor. No respiratory distress.  Abdominal: She exhibits no distension.  Musculoskeletal: She exhibits edema and tenderness.  Ulcerative lesion on the medial aspect of the right MTP joint of the big toe, white purulent type drainage, surrounding erythema extending to the midfoot, tenderness palpation of the right big toe  Neurological: She is alert. Cranial nerve deficit: no gross deficits.  Skin: Skin is warm and dry. No rash noted.  Psychiatric: She has a normal mood and affect.  Nursing note and vitals reviewed.    ED Treatments / Results  Labs (all labs ordered are listed, but only abnormal results are displayed) Labs Reviewed  CBC - Abnormal; Notable for the following:       Result Value   WBC 10.7 (*)    RBC 3.79 (*)    Hemoglobin 11.2 (*)    HCT 34.3 (*)    All other components within normal limits  BASIC METABOLIC PANEL - Abnormal; Notable for the following:    Sodium 132 (*)    CO2 20 (*)    Glucose, Bld 330 (*)    BUN 34 (*)    Creatinine, Ser 1.65 (*)    Calcium 8.5 (*)    GFR calc non Af Amer 30 (*)    GFR calc Af Amer 35 (*)  All other  components within normal limits  CBG MONITORING, ED - Abnormal; Notable for the following:    Glucose-Capillary 260 (*)    All other components within normal limits  CULTURE, BLOOD (ROUTINE X 2)  CULTURE, BLOOD (ROUTINE X 2)  AEROBIC CULTURE (SUPERFICIAL SPECIMEN)   Radiology Dg Foot Complete Right  Addendum Date: 05/05/2016   ADDENDUM REPORT: 05/05/2016 20:01 ADDENDUM: Mentioned in the body, but not mentioned in the impression is severe osteolysis of the third middle and distal phalanx which may be secondary to infection or gout. Electronically Signed   By: Kathreen Devoid   On: 05/05/2016 20:01   Result Date: 05/05/2016 CLINICAL DATA:  Draining ulcer of the right great toe. EXAM: RIGHT FOOT COMPLETE - 3+ VIEW COMPARISON:  None. FINDINGS: No acute fracture or dislocation. Severe generalized osteopenia. Large periarticular erosion involving the lateral head of the fifth metatarsal as can be seen with crystalline arthropathy such as gout. Severe soft tissue swelling along the medial aspect of the first MTP joint. No definite erosive changes or bone destruction. Moderate osteoarthritis of the first MTP joint. Mild osteoarthritis of the first IP joint. 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 Severe degenerative changes of the tarsometatarsal joints throughout the right foot. Plantar calcaneal spur with calcification of the plantar fascia. Enthesopathic changes of the Achilles tendon insertion. Peripheral vascular atherosclerotic disease. IMPRESSION: 1. Severe soft tissue swelling along the medial aspect of the first MTP joint concerning for severe cellulitis. No definite erosive changes or bone destruction to suggest osteomyelitis. If there is further clinical concern, recommend MRI of the right foot. 2. Large periarticular erosion involving the lateral head of the fifth metatarsal as can be seen with crystalline arthropathy such as gout. Electronically Signed: By: Kathreen Devoid On:  05/05/2016 19:58    Procedures Procedures (including critical care time)  Medications Ordered in ED Medications  cefTRIAXone (ROCEPHIN) 1 g in dextrose 5 % 50 mL IVPB (1 g Intravenous New Bag/Given 05/05/16 2104)  insulin aspart (novoLOG) injection 8 Units (8 Units Subcutaneous Given 05/05/16 2103)     Initial Impression / Assessment and Plan / ED Course  I have reviewed the triage vital signs and the nursing notes.  Pertinent labs & imaging results that were available during my care of the patient were reviewed by me and considered in my medical decision making (see chart for details).  Clinical Course     Pt has evidence of gout and likely a component of cellulitis of her big toe and foot.    Pt was seen the other day and was called back in because of positive blood cultures, gram positive cocci.  Afebrile here.  Non toxic.  Doubt sepsis.  Xrays without osteomyelitis.  IV abx started.  Cultures repeated.  Will consult for admission, follow up on cultures.  Final Clinical Impressions(s) / ED Diagnoses   Final diagnoses:  Cellulitis of right lower extremity  Positive blood culture      Dorie Rank, MD 05/05/16 2121

## 2016-05-06 ENCOUNTER — Observation Stay (HOSPITAL_COMMUNITY): Payer: Medicare Other

## 2016-05-06 ENCOUNTER — Observation Stay (HOSPITAL_BASED_OUTPATIENT_CLINIC_OR_DEPARTMENT_OTHER): Payer: Medicare Other

## 2016-05-06 DIAGNOSIS — B9561 Methicillin susceptible Staphylococcus aureus infection as the cause of diseases classified elsewhere: Secondary | ICD-10-CM | POA: Diagnosis present

## 2016-05-06 DIAGNOSIS — L03031 Cellulitis of right toe: Secondary | ICD-10-CM | POA: Diagnosis not present

## 2016-05-06 DIAGNOSIS — Z833 Family history of diabetes mellitus: Secondary | ICD-10-CM | POA: Diagnosis not present

## 2016-05-06 DIAGNOSIS — M10071 Idiopathic gout, right ankle and foot: Secondary | ICD-10-CM | POA: Diagnosis not present

## 2016-05-06 DIAGNOSIS — Z8249 Family history of ischemic heart disease and other diseases of the circulatory system: Secondary | ICD-10-CM | POA: Diagnosis not present

## 2016-05-06 DIAGNOSIS — I82402 Acute embolism and thrombosis of unspecified deep veins of left lower extremity: Secondary | ICD-10-CM | POA: Diagnosis not present

## 2016-05-06 DIAGNOSIS — E11319 Type 2 diabetes mellitus with unspecified diabetic retinopathy without macular edema: Secondary | ICD-10-CM | POA: Diagnosis present

## 2016-05-06 DIAGNOSIS — M79671 Pain in right foot: Secondary | ICD-10-CM | POA: Diagnosis not present

## 2016-05-06 DIAGNOSIS — E1122 Type 2 diabetes mellitus with diabetic chronic kidney disease: Secondary | ICD-10-CM | POA: Diagnosis present

## 2016-05-06 DIAGNOSIS — Z86010 Personal history of colonic polyps: Secondary | ICD-10-CM | POA: Diagnosis not present

## 2016-05-06 DIAGNOSIS — I82401 Acute embolism and thrombosis of unspecified deep veins of right lower extremity: Secondary | ICD-10-CM | POA: Diagnosis not present

## 2016-05-06 DIAGNOSIS — N184 Chronic kidney disease, stage 4 (severe): Secondary | ICD-10-CM | POA: Diagnosis present

## 2016-05-06 DIAGNOSIS — Z794 Long term (current) use of insulin: Secondary | ICD-10-CM | POA: Diagnosis not present

## 2016-05-06 DIAGNOSIS — E11628 Type 2 diabetes mellitus with other skin complications: Secondary | ICD-10-CM | POA: Diagnosis present

## 2016-05-06 DIAGNOSIS — N179 Acute kidney failure, unspecified: Secondary | ICD-10-CM | POA: Diagnosis present

## 2016-05-06 DIAGNOSIS — D631 Anemia in chronic kidney disease: Secondary | ICD-10-CM | POA: Diagnosis present

## 2016-05-06 DIAGNOSIS — E785 Hyperlipidemia, unspecified: Secondary | ICD-10-CM | POA: Diagnosis present

## 2016-05-06 DIAGNOSIS — E1143 Type 2 diabetes mellitus with diabetic autonomic (poly)neuropathy: Secondary | ICD-10-CM | POA: Diagnosis present

## 2016-05-06 DIAGNOSIS — I129 Hypertensive chronic kidney disease with stage 1 through stage 4 chronic kidney disease, or unspecified chronic kidney disease: Secondary | ICD-10-CM | POA: Diagnosis present

## 2016-05-06 DIAGNOSIS — E11621 Type 2 diabetes mellitus with foot ulcer: Secondary | ICD-10-CM | POA: Diagnosis present

## 2016-05-06 DIAGNOSIS — I2510 Atherosclerotic heart disease of native coronary artery without angina pectoris: Secondary | ICD-10-CM | POA: Diagnosis present

## 2016-05-06 DIAGNOSIS — L97519 Non-pressure chronic ulcer of other part of right foot with unspecified severity: Secondary | ICD-10-CM | POA: Diagnosis present

## 2016-05-06 DIAGNOSIS — Z79899 Other long term (current) drug therapy: Secondary | ICD-10-CM | POA: Diagnosis not present

## 2016-05-06 DIAGNOSIS — Z800 Family history of malignant neoplasm of digestive organs: Secondary | ICD-10-CM | POA: Diagnosis not present

## 2016-05-06 DIAGNOSIS — M1A9XX1 Chronic gout, unspecified, with tophus (tophi): Secondary | ICD-10-CM | POA: Diagnosis present

## 2016-05-06 DIAGNOSIS — E039 Hypothyroidism, unspecified: Secondary | ICD-10-CM | POA: Diagnosis present

## 2016-05-06 DIAGNOSIS — R7881 Bacteremia: Secondary | ICD-10-CM | POA: Diagnosis not present

## 2016-05-06 DIAGNOSIS — L03115 Cellulitis of right lower limb: Secondary | ICD-10-CM | POA: Diagnosis present

## 2016-05-06 DIAGNOSIS — K3184 Gastroparesis: Secondary | ICD-10-CM | POA: Diagnosis present

## 2016-05-06 LAB — COMPREHENSIVE METABOLIC PANEL
ALBUMIN: 2.9 g/dL — AB (ref 3.5–5.0)
ALK PHOS: 82 U/L (ref 38–126)
ALT: 7 U/L — ABNORMAL LOW (ref 14–54)
ANION GAP: 6 (ref 5–15)
AST: 12 U/L — ABNORMAL LOW (ref 15–41)
BUN: 31 mg/dL — ABNORMAL HIGH (ref 6–20)
CALCIUM: 8.6 mg/dL — AB (ref 8.9–10.3)
CO2: 22 mmol/L (ref 22–32)
Chloride: 106 mmol/L (ref 101–111)
Creatinine, Ser: 1.48 mg/dL — ABNORMAL HIGH (ref 0.44–1.00)
GFR calc Af Amer: 40 mL/min — ABNORMAL LOW (ref >60)
GFR calc non Af Amer: 35 mL/min — ABNORMAL LOW (ref >60)
GLUCOSE: 157 mg/dL — AB (ref 65–99)
Potassium: 4.8 mmol/L (ref 3.5–5.1)
SODIUM: 134 mmol/L — AB (ref 135–145)
Total Bilirubin: 0.5 mg/dL (ref 0.3–1.2)
Total Protein: 6.5 g/dL (ref 6.5–8.1)

## 2016-05-06 LAB — CBC
HCT: 31.2 % — ABNORMAL LOW (ref 36.0–46.0)
HEMOGLOBIN: 10.4 g/dL — AB (ref 12.0–15.0)
MCH: 30.1 pg (ref 26.0–34.0)
MCHC: 33.3 g/dL (ref 30.0–36.0)
MCV: 90.4 fL (ref 78.0–100.0)
Platelets: 207 10*3/uL (ref 150–400)
RBC: 3.45 MIL/uL — ABNORMAL LOW (ref 3.87–5.11)
RDW: 12.6 % (ref 11.5–15.5)
WBC: 11.3 10*3/uL — AB (ref 4.0–10.5)

## 2016-05-06 LAB — BLOOD CULTURE ID PANEL (REFLEXED)
Acinetobacter baumannii: NOT DETECTED
Candida albicans: NOT DETECTED
Candida glabrata: NOT DETECTED
Candida krusei: NOT DETECTED
Candida parapsilosis: NOT DETECTED
Candida tropicalis: NOT DETECTED
Enterobacter cloacae complex: NOT DETECTED
Enterobacteriaceae species: NOT DETECTED
Enterococcus species: NOT DETECTED
Escherichia coli: NOT DETECTED
Haemophilus influenzae: NOT DETECTED
Klebsiella oxytoca: NOT DETECTED
Klebsiella pneumoniae: NOT DETECTED
Listeria monocytogenes: NOT DETECTED
Methicillin resistance: DETECTED — AB
Neisseria meningitidis: NOT DETECTED
Proteus species: NOT DETECTED
Pseudomonas aeruginosa: NOT DETECTED
Serratia marcescens: NOT DETECTED
Staphylococcus aureus (BCID): NOT DETECTED
Staphylococcus species: DETECTED — AB
Streptococcus agalactiae: NOT DETECTED
Streptococcus pneumoniae: NOT DETECTED
Streptococcus pyogenes: NOT DETECTED
Streptococcus species: DETECTED — AB

## 2016-05-06 LAB — GLUCOSE, CAPILLARY
GLUCOSE-CAPILLARY: 252 mg/dL — AB (ref 65–99)
GLUCOSE-CAPILLARY: 155 mg/dL — AB (ref 65–99)
Glucose-Capillary: 136 mg/dL — ABNORMAL HIGH (ref 65–99)
Glucose-Capillary: 181 mg/dL — ABNORMAL HIGH (ref 65–99)

## 2016-05-06 LAB — ECHOCARDIOGRAM COMPLETE
HEIGHTINCHES: 62 in
Weight: 2539.7 oz

## 2016-05-06 LAB — URIC ACID: Uric Acid, Serum: 8.4 mg/dL — ABNORMAL HIGH (ref 2.3–6.6)

## 2016-05-06 MED ORDER — PANTOPRAZOLE SODIUM 40 MG PO TBEC
40.0000 mg | DELAYED_RELEASE_TABLET | Freq: Every day | ORAL | Status: DC
  Administered 2016-05-06 – 2016-05-08 (×3): 40 mg via ORAL
  Filled 2016-05-06 (×3): qty 1

## 2016-05-06 MED ORDER — VANCOMYCIN HCL IN DEXTROSE 1-5 GM/200ML-% IV SOLN
1000.0000 mg | INTRAVENOUS | Status: DC
  Administered 2016-05-06 – 2016-05-08 (×2): 1000 mg via INTRAVENOUS
  Filled 2016-05-06 (×3): qty 200

## 2016-05-06 MED ORDER — DEXTROSE 5 % IV SOLN
2.0000 g | INTRAVENOUS | Status: DC
  Administered 2016-05-06 – 2016-05-08 (×3): 2 g via INTRAVENOUS
  Filled 2016-05-06 (×6): qty 2

## 2016-05-06 MED ORDER — VANCOMYCIN HCL IN DEXTROSE 1-5 GM/200ML-% IV SOLN
1000.0000 mg | Freq: Once | INTRAVENOUS | Status: AC
  Administered 2016-05-06: 1000 mg via INTRAVENOUS
  Filled 2016-05-06: qty 200

## 2016-05-06 NOTE — Progress Notes (Signed)
PROGRESS NOTE                                                                                                                                                                                                             Patient Demographics:    [name], is a 70 y.o. female, DOB - 1945/09/28, OQH:[id number]  Admit date - 05/05/2016   Admitting Physician Oswald Hillock, MD  Outpatient Primary MD for the patient is [name] Maize, MD  LOS - 0  Chief Complaint  Patient presents with  . Foot Pain       Brief Narrative   [name]  is a 70 y.o. female, With history of gout, diabetes mellitus who came to the hospital with worsening redness and swelling of the MTP joint of right big toe for past few days. Patient was seen in the ED yesterday and diagnosed with cellulitis, along with gouty arthritis. Patient was sent home after drawing blood cultures. Patient was called back to the ED, as blood cultures are growing gram-positive cocci in 2 out of 2 bottles.   Subjective:    [name] today has, No headache, No chest pain, No abdominal pain - No Nausea, No new weakness tingling or numbness, No Cough - SOB.     Assessment  & Plan :      1.Right toe ulcer with surrounding cellulitis. Appears to be growing MRSA 2 out of 2 blood cultures from 05/04/2016, continue vancomycin and Rocephin, surgery consulted along with wound care for right toe ulcer, will also get TEE. We'll get PICC line in a few days followed by surveillance cultures.  2. Dyslipidemia. On statin continue.  3. Hypothyroidism. Continue Synthroid.  4. Hypertension. Continue beta blocker and Norvasc at home dose.  5. DM type II. Currently on sliding scale.  Lab Results  Component Value Date   HGBA1C 7.3 (H) 11/21/2015   CBG (last 3)   Recent Labs  05/05/16 1758 05/05/16 2221 05/06/16 0800  GLUCAP 260* 254* 136*        Family  Communication  :  None  Code Status :  Full  Diet : Diet Carb Modified Fluid consistency: Thin; Room service appropriate? Yes   Disposition Plan  :  Home 2-3 days  Consults  :  Surgey  Procedures  :  MRI of the right foot. Right big toe ulcer with possible surrounding cellulitis  DVT Prophylaxis  :  Lovenox   Lab Results  Component Value Date   PLT 207 05/06/2016    Inpatient Medications  Scheduled Meds: . amLODipine  5 mg Oral Daily  . atorvastatin  40 mg Oral QPM  . cefTRIAXone (ROCEPHIN)  IV  2 g Intravenous Q24H  . enoxaparin (LOVENOX) injection  40 mg Subcutaneous Q24H  . insulin aspart  0-9 Units Subcutaneous TID WC  . levothyroxine  75 mcg Oral QAC breakfast  . metoprolol succinate  50 mg Oral q morning - 10a  . pantoprazole  40 mg Oral Daily  . vancomycin  1,000 mg Intravenous Q24H   Continuous Infusions: . sodium chloride 75 mL/hr at 05/06/16 1026   PRN Meds:.acetaminophen **OR** acetaminophen, ondansetron **OR** ondansetron (ZOFRAN) IV  Antibiotics  :    Anti-infectives    Start     Dose/Rate Route Frequency Ordered Stop   05/06/16 1100  vancomycin (VANCOCIN) IVPB 1000 mg/200 mL premix     1,000 mg 200 mL/hr over 60 Minutes Intravenous Every 24 hours 05/06/16 0744     05/06/16 1000  cefTRIAXone (ROCEPHIN) 2 g in dextrose 5 % 50 mL IVPB     2 g 100 mL/hr over 30 Minutes Intravenous Every 24 hours 05/06/16 0743     05/06/16 0200  vancomycin (VANCOCIN) IVPB 1000 mg/200 mL premix     1,000 mg 200 mL/hr over 60 Minutes Intravenous  Once 05/06/16 0148 05/06/16 0400   05/05/16 2100  cefTRIAXone (ROCEPHIN) 1 g in dextrose 5 % 50 mL IVPB     1 g 100 mL/hr over 30 Minutes Intravenous  Once 05/05/16 2047 05/05/16 2225         Objective:   Vitals:   05/05/16 1954 05/05/16 2214 05/05/16 2244 05/06/16 0608  BP: 115/78 121/76 (!) 135/51 (!) 118/48  Pulse: 64 71 70 65  Resp: 16 18 18 18   Temp:  98.2 F (36.8 C) 98.7 F (37.1 C) 98.6 F (37 C)    TempSrc:  Oral Oral Oral  SpO2: 100% 97% 100% 97%  Weight:   72 kg (158 lb 11.7 oz)   Height:        Wt Readings from Last 3 Encounters:  05/05/16 72 kg (158 lb 11.7 oz)  05/04/16 72.1 kg (159 lb)  03/11/16 73 kg (161 lb)     Intake/Output Summary (Last 24 hours) at 05/06/16 1039 Last data filed at 05/06/16 0700  Gross per 24 hour  Intake           566.25 ml  Output              200 ml  Net           366.25 ml     Physical Exam  Awake Alert, Oriented X 3, No new F.N deficits, Normal affect [place].AT,PERRAL Supple Neck,No JVD, No cervical lymphadenopathy appriciated.  Symmetrical Chest wall movement, Good air movement bilaterally, CTAB RRR,No Gallops,Rubs or new Murmurs, No Parasternal Heave +ve B.Sounds, Abd Soft, No tenderness, No organomegaly appriciated, No rebound - guarding or rigidity. No Cyanosis, Clubbing or edema, No new Rash or bruise  1st R MTP has a small open wound with minimal drainage and surrounding erythema    Data Review:    CBC  Recent Labs Lab 05/04/16 1854 05/05/16 1934 05/06/16 0637  WBC 12.0* 10.7* 11.3*  HGB 11.3* 11.2* 10.4*  HCT 34.4* 34.3*  31.2*  PLT 205 213 207  MCV 90.3 90.5 90.4  MCH 29.7 29.6 30.1  MCHC 32.8 32.7 33.3  RDW 12.6 12.6 12.6  LYMPHSABS 1.8  --   --   MONOABS 1.1*  --   --   EOSABS 0.1  --   --   BASOSABS 0.0  --   --     Chemistries   Recent Labs Lab 05/04/16 1854 05/05/16 1934 05/06/16 0637  NA 133* 132* 134*  K 5.1 5.1 4.8  CL 104 104 106  CO2 21* 20* 22  GLUCOSE 155* 330* 157*  BUN 36* 34* 31*  CREATININE 1.72* 1.65* 1.48*  CALCIUM 9.1 8.5* 8.6*  AST  --   --  12*  ALT  --   --  7*  ALKPHOS  --   --  82  BILITOT  --   --  0.5   ------------------------------------------------------------------------------------------------------------------ No results for input(s): CHOL, HDL, LDLCALC, TRIG, CHOLHDL, LDLDIRECT in the last 72 hours.  Lab Results  Component Value Date   HGBA1C 7.3 (H)  11/21/2015   ------------------------------------------------------------------------------------------------------------------ No results for input(s): TSH, T4TOTAL, T3FREE, THYROIDAB in the last 72 hours.  Invalid input(s): FREET3 ------------------------------------------------------------------------------------------------------------------ No results for input(s): VITAMINB12, FOLATE, FERRITIN, TIBC, IRON, RETICCTPCT in the last 72 hours.  Coagulation profile No results for input(s): INR, PROTIME in the last 168 hours.  No results for input(s): DDIMER in the last 72 hours.  Cardiac Enzymes No results for input(s): CKMB, TROPONINI, MYOGLOBIN in the last 168 hours.  Invalid input(s): CK ------------------------------------------------------------------------------------------------------------------ No results found for: BNP  Micro Results Recent Results (from the past 240 hour(s))  Aerobic Culture (superficial specimen)     Status: None (Preliminary result)   Collection Time: 05/04/16  6:32 PM  Result Value Ref Range Status   Specimen Description FOOT RIGHT  Final   Special Requests NONE  Final   Gram Stain   Final    ABUNDANT WBC PRESENT,BOTH PMN AND MONONUCLEAR FEW GRAM POSITIVE COCCI IN PAIRS    Culture   Final    CULTURE REINCUBATED FOR BETTER GROWTH Performed at [facility]    Report Status PENDING  Incomplete  Blood culture (routine x 2)     Status: None (Preliminary result)   Collection Time: 05/04/16  6:57 PM  Result Value Ref Range Status   Specimen Description BLOOD RIGHT HAND  Final   Special Requests BOTTLES DRAWN AEROBIC AND ANAEROBIC [place]  Final   Culture  Setup Time   Final    GRAM POSITIVE COCCI ANAEROBIC BOTTLE ONLY Performed at [facility] Gram Stain Report Called to,Read Back By and Verified With: CARDWELL,L AT 1600 ON 05/05/2016 BY ISLEY,B Performed at [facility]    Culture NO GROWTH < 24 HOURS  Final   Report  Status PENDING  Incomplete  Blood culture (routine x 2)     Status: None (Preliminary result)   Collection Time: 05/04/16  6:57 PM  Result Value Ref Range Status   Specimen Description RIGHT ANTECUBITAL  Final   Special Requests BOTTLES DRAWN AEROBIC AND ANAEROBIC [place]  Final   Culture  Setup Time   Final    GRAM POSITIVE COCCI IN BOTH AEROBIC AND ANAEROBIC BOTTLES Performed at [facility] Gram Stain Report Called to,Read Back By and Verified With: CARDWELL AT 8756 ON 05/05/2016 BY ISLEY,B Organism ID to follow CRITICAL RESULT CALLED TO, READ BACK BY AND VERIFIED WITHHinton Dyer RN [phone] 05/06/16 A BROWNING  Performed at [facility]    Culture NO GROWTH < 24 HOURS  Final   Report Status PENDING  Incomplete  Blood Culture ID Panel (Reflexed)     Status: Abnormal   Collection Time: 05/04/16  6:57 PM  Result Value Ref Range Status   Enterococcus species NOT DETECTED NOT DETECTED Final   Listeria monocytogenes NOT DETECTED NOT DETECTED Final   Staphylococcus species DETECTED (A) NOT DETECTED Final    Comment: CRITICAL RESULT CALLED TO, READ BACK BY AND VERIFIED WITHHinton Dyer RN [phone] 05/06/16 A BROWNING    Staphylococcus aureus NOT DETECTED NOT DETECTED Final   Methicillin resistance DETECTED (A) NOT DETECTED Final    Comment: CRITICAL RESULT CALLED TO, READ BACK BY AND VERIFIED WITHHinton Dyer RN [phone] 05/06/16 A BROWNING    Streptococcus species DETECTED (A) NOT DETECTED Final    Comment: CRITICAL RESULT CALLED TO, READ BACK BY AND VERIFIED WITHHinton Dyer RN [phone] 05/06/16 A BROWNING    Streptococcus agalactiae NOT DETECTED NOT DETECTED Final   Streptococcus pneumoniae NOT DETECTED NOT DETECTED Final   Streptococcus pyogenes NOT DETECTED NOT DETECTED Final   Acinetobacter baumannii NOT DETECTED NOT DETECTED Final   Enterobacteriaceae species NOT DETECTED NOT DETECTED Final   Enterobacter cloacae complex NOT DETECTED NOT DETECTED Final   Escherichia coli NOT  DETECTED NOT DETECTED Final   Klebsiella oxytoca NOT DETECTED NOT DETECTED Final   Klebsiella pneumoniae NOT DETECTED NOT DETECTED Final   Proteus species NOT DETECTED NOT DETECTED Final   Serratia marcescens NOT DETECTED NOT DETECTED Final   Haemophilus influenzae NOT DETECTED NOT DETECTED Final   Neisseria meningitidis NOT DETECTED NOT DETECTED Final   Pseudomonas aeruginosa NOT DETECTED NOT DETECTED Final   Candida albicans NOT DETECTED NOT DETECTED Final   Candida glabrata NOT DETECTED NOT DETECTED Final   Candida krusei NOT DETECTED NOT DETECTED Final   Candida parapsilosis NOT DETECTED NOT DETECTED Final   Candida tropicalis NOT DETECTED NOT DETECTED Final    Comment: Performed at [facility]  Blood culture (routine x 2)     Status: None (Preliminary result)   Collection Time: 05/05/16  7:34 PM  Result Value Ref Range Status   Specimen Description BLOOD RIGHT HAND  Final   Special Requests BOTTLES DRAWN AEROBIC AND ANAEROBIC [facility] EACH  Final   Culture PENDING  Incomplete   Report Status PENDING  Incomplete  Blood culture (routine x 2)     Status: None (Preliminary result)   Collection Time: 05/05/16  8:08 PM  Result Value Ref Range Status   Specimen Description LEFT ANTECUBITAL  Final   Special Requests BOTTLES DRAWN AEROBIC AND ANAEROBIC [place]  Final   Culture PENDING  Incomplete   Report Status PENDING  Incomplete    Radiology Reports Mr Foot Right Wo Contrast  Result Date: 05/06/2016 CLINICAL DATA:  Pain, redness of the entire right foot for 2 months EXAM: MRI OF THE RIGHT FOREFOOT WITHOUT CONTRAST TECHNIQUE: Multiplanar, multisequence MR imaging of the right forefoot was performed. No intravenous contrast was administered. COMPARISON:  None. FINDINGS: Bones/Joint/Cartilage No acute fracture or dislocation. Multiple erosions involving the base of the second, third and fourth metatarsals. Small erosions involving the medial cuneiform, middle cuneiform, lateral  cuneiform, cuboid, medial distal navicular and distal lateral calcaneus. Large erosion involving the lateral aspect of the fifth metatarsal head and neck. Adjacent to the erosions there are T1 and T2 low signal nodular soft tissue abnormality. Severe osteolysis of the  third middle and distal phalanx with associated T1 and T2 low signal nodular soft tissue abnormality. In the plantar medial aspect of the great toe there is a 3.4 x 4.2 x 3.8 cm T1 hypointense and heterogeneously T2 hyperintense soft tissue abnormality at the level of the first MTP joint. No underlying bone destruction. Overall normal alignment. No significant joint effusion. Moderate osteoarthritis of the first MTP joint. Mild osteoarthritis of the first IP joint. Ligaments Collateral ligaments are intact. Muscles and Tendons Muscles are normal. Flexor, extensor and peroneal tendons are grossly intact. Soft tissues No other fluid collection or hematoma. IMPRESSION: 1. 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 2. In the plantar medial aspect of the great toe there is a 3.4 x 4.2 x 3.8 cm T1 hypointense and heterogeneously T2 hyperintense soft tissue abnormality at the level of the first MTP joint. This abnormality is different in signal compared with the remainder of the midfoot and forefoot abnormalities. This may reflect tophaceous gout versus phlegmon with a small abscess. No focal marrow signal abnormality or bone destruction to suggest osteomyelitis. Electronically Signed   By: Kathreen Devoid   On: 05/06/2016 09:56   Dg Foot Complete Right  Addendum Date: 05/05/2016   ADDENDUM REPORT: 05/05/2016 20:01 ADDENDUM: Mentioned in the body, but not mentioned in the impression is severe osteolysis of the third middle and distal phalanx which may be secondary to infection or gout. Electronically Signed   By: Kathreen Devoid   On:  05/05/2016 20:01   Result Date: 05/05/2016 CLINICAL DATA:  Draining ulcer of the right great toe. EXAM: RIGHT FOOT COMPLETE - 3+ VIEW COMPARISON:  None. FINDINGS: No acute fracture or dislocation. Severe generalized osteopenia. Large periarticular erosion involving the lateral head of the fifth metatarsal as can be seen with crystalline arthropathy such as gout. Severe soft tissue swelling along the medial aspect of the first MTP joint. No definite erosive changes or bone destruction. Moderate osteoarthritis of the first MTP joint. Mild osteoarthritis of the first IP joint. 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 Severe degenerative changes of the tarsometatarsal joints throughout the right foot. Plantar calcaneal spur with calcification of the plantar fascia. Enthesopathic changes of the Achilles tendon insertion. Peripheral vascular atherosclerotic disease. IMPRESSION: 1. Severe soft tissue swelling along the medial aspect of the first MTP joint concerning for severe cellulitis. No definite erosive changes or bone destruction to suggest osteomyelitis. If there is further clinical concern, recommend MRI of the right foot. 2. Large periarticular erosion involving the lateral head of the fifth metatarsal as can be seen with crystalline arthropathy such as gout. Electronically Signed: By: Kathreen Devoid On: 05/05/2016 19:58    Time Spent in minutes  30   Lala Lund K M.D on 05/06/2016 at 10:39 AM  Between 7am to 7pm - Pager - [phone]  After 7pm go to [email or web address] - password [facility]  [facility] -  Office  [phone]

## 2016-05-06 NOTE — Progress Notes (Signed)
Notified Dr. Darrick Meigs of positive blood cultures from cellulitis of foot.  Orders received, will continue to monitor patient.

## 2016-05-06 NOTE — Progress Notes (Signed)
[place] for Vancomycin and Rocephin Indication: Bacteremia   Allergies  Allergen Reactions  . Aspirin Nausea And Vomiting  . Ciprofloxacin Other (See Comments)    Upset Stomach  . Codeine Nausea And Vomiting    Makes me sick   . Micardis [Telmisartan] Other (See Comments)    unknown  . Nexium [Esomeprazole Magnesium] Other (See Comments)    Causes internal bleeding  . Niaspan [Niacin Er] Other (See Comments)    Reaction is unknown  . Onglyza [Saxagliptin] Other (See Comments)    Reaction is unknown  . Other     No otc pain medications  . Rofecoxib Other (See Comments)    unknown  . Simvastatin   . Tequin [Gatifloxacin] Other (See Comments)    Reaction is unknown  . Welchol [Colesevelam Hcl]   . Amoxicillin Rash   Patient Measurements: Height: 5\' 2"  (157.5 cm) Weight: 158 lb 11.7 oz (72 kg) IBW/kg (Calculated) : 50.1  Vital Signs: Temp: 98.6 F (37 C) (12/14 0608) Temp Source: Oral (12/14 5400) BP: 118/48 (12/14 0608) Pulse Rate: 65 (12/14 0608)  Labs:  Recent Labs  05/04/16 1854 05/05/16 1934 05/06/16 0637  WBC 12.0* 10.7* 11.3*  HGB 11.3* 11.2* 10.4*  PLT 205 213 207  CREATININE 1.72* 1.65* 1.48*   Estimated Creatinine Clearance: 32.9 mL/min (by C-G formula based on SCr of 1.48 mg/dL (H)).  No results for input(s): VANCOTROUGH, VANCOPEAK, VANCORANDOM, GENTTROUGH, GENTPEAK, GENTRANDOM, TOBRATROUGH, TOBRAPEAK, TOBRARND, AMIKACINPEAK, AMIKACINTROU, AMIKACIN in the last 72 hours.   Microbiology: Recent Results (from the past 720 hour(s))  Aerobic Culture (superficial specimen)     Status: None (Preliminary result)   Collection Time: 05/04/16  6:32 PM  Result Value Ref Range Status   Specimen Description FOOT RIGHT  Final   Special Requests NONE  Final   Gram Stain   Final    ABUNDANT WBC PRESENT,BOTH PMN AND MONONUCLEAR FEW GRAM POSITIVE COCCI IN PAIRS    Culture   Final    ABUNDANT STAPHYLOCOCCUS  AUREUS SUSCEPTIBILITIES TO FOLLOW Performed at [facility]    Report Status PENDING  Incomplete  Blood culture (routine x 2)     Status: None (Preliminary result)   Collection Time: 05/04/16  6:57 PM  Result Value Ref Range Status   Specimen Description BLOOD RIGHT HAND  Final   Special Requests BOTTLES DRAWN AEROBIC AND ANAEROBIC [place]  Final   Culture  Setup Time   Final    GRAM POSITIVE COCCI ANAEROBIC BOTTLE ONLY Performed at [facility] Gram Stain Report Called to,Read Back By and Verified With: CARDWELL,L AT 8676 ON 05/05/2016 BY ISLEY,B    Culture   Final    GRAM POSITIVE COCCI CULTURE REINCUBATED FOR BETTER GROWTH Performed at [facility]    Report Status PENDING  Incomplete  Blood culture (routine x 2)     Status: None (Preliminary result)   Collection Time: 05/04/16  6:57 PM  Result Value Ref Range Status   Specimen Description RIGHT ANTECUBITAL  Final   Special Requests BOTTLES DRAWN AEROBIC AND ANAEROBIC [place]  Final   Culture  Setup Time   Final    GRAM POSITIVE COCCI IN BOTH AEROBIC AND ANAEROBIC BOTTLES Performed at [facility] Gram Stain Report Called to,Read Back By and Verified With: CARDWELL AT 1950 ON 05/05/2016 BY ISLEY,B CRITICAL RESULT CALLED TO, READ BACK BY AND VERIFIED WITHHinton Dyer RN [phone] 05/06/16 A BROWNING Performed at [place]  [place]  Final   Report Status PENDING  Incomplete  Blood Culture ID Panel (Reflexed)     Status: Abnormal   Collection Time: 05/04/16  6:57 PM  Result Value Ref Range Status   Enterococcus species NOT DETECTED NOT DETECTED Final   Listeria monocytogenes NOT DETECTED NOT DETECTED Final   Staphylococcus species DETECTED (A) NOT DETECTED Final    Comment: CRITICAL RESULT CALLED TO, READ BACK BY AND VERIFIED WITHHinton Dyer RN [phone] 05/06/16 A BROWNING    Staphylococcus aureus NOT DETECTED NOT DETECTED Final   Methicillin resistance DETECTED (A)  NOT DETECTED Final    Comment: CRITICAL RESULT CALLED TO, READ BACK BY AND VERIFIED WITHHinton Dyer RN [phone] 05/06/16 A BROWNING    Streptococcus species DETECTED (A) NOT DETECTED Final    Comment: CRITICAL RESULT CALLED TO, READ BACK BY AND VERIFIED WITHHinton Dyer RN [phone] 05/06/16 A BROWNING    Streptococcus agalactiae NOT DETECTED NOT DETECTED Final   Streptococcus pneumoniae NOT DETECTED NOT DETECTED Final   Streptococcus pyogenes NOT DETECTED NOT DETECTED Final   Acinetobacter baumannii NOT DETECTED NOT DETECTED Final   Enterobacteriaceae species NOT DETECTED NOT DETECTED Final   Enterobacter cloacae complex NOT DETECTED NOT DETECTED Final   Escherichia coli NOT DETECTED NOT DETECTED Final   Klebsiella oxytoca NOT DETECTED NOT DETECTED Final   Klebsiella pneumoniae NOT DETECTED NOT DETECTED Final   Proteus species NOT DETECTED NOT DETECTED Final   Serratia marcescens NOT DETECTED NOT DETECTED Final   Haemophilus influenzae NOT DETECTED NOT DETECTED Final   Neisseria meningitidis NOT DETECTED NOT DETECTED Final   Pseudomonas aeruginosa NOT DETECTED NOT DETECTED Final   Candida albicans NOT DETECTED NOT DETECTED Final   Candida glabrata NOT DETECTED NOT DETECTED Final   Candida krusei NOT DETECTED NOT DETECTED Final   Candida parapsilosis NOT DETECTED NOT DETECTED Final   Candida tropicalis NOT DETECTED NOT DETECTED Final    Comment: Performed at [facility]  Blood culture (routine x 2)     Status: None (Preliminary result)   Collection Time: 05/05/16  7:34 PM  Result Value Ref Range Status   Specimen Description BLOOD RIGHT HAND  Final   Special Requests BOTTLES DRAWN AEROBIC AND ANAEROBIC 6CC EACH  Final   Culture NO GROWTH < 24 HOURS  Final   Report Status PENDING  Incomplete  Blood culture (routine x 2)     Status: None (Preliminary result)   Collection Time: 05/05/16  8:08 PM  Result Value Ref Range Status   Specimen Description LEFT ANTECUBITAL  Final   Special  Requests BOTTLES DRAWN AEROBIC AND ANAEROBIC 6CC EACH  Final   Culture NO GROWTH < 24 HOURS  Final   Report Status PENDING  Incomplete   Medical History: Past Medical History:  Diagnosis Date  . Anemia in chronic renal disease    Aranesp injection --  when Hg <11, last injection 12-24-14.  Marland Kitchen Anxiety   . Arthritis    knees and hand/fingers. "broke back"-being evaluated for this"weakness left leg"  . CKD (chronic kidney disease), stage III    nephrologist--  dr Mercy Moore  . Complication of anesthesia    post-op confusion   . Constipation   . Coronary artery disease   . Diabetic gastroparesis ([place])   . Diabetic retinopathy ([place])    bilateral --  monitored by dr [name]  . Diverticulosis of colon   . GAD (generalized anxiety disorder)   .  GERD (gastroesophageal reflux disease)   . History of colon polyps    benign  . History of esophagitis   . History of GI bleed    upper 2009  due to esophagitis  &  2002  due to Mallory-Weiss tear  . History of gout    in issues for several years  . 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 History of rectal abscess    12-29-2004  bedside I & D  . Hyperlipidemia   . Hypertension   . Hypothyroidism   . LAFB (left anterior fascicular block)   . Right bundle branch block   . Sacral decubitus ulcer    since 2014- 01-02-15 remains with wound" gauze dressing changes daily.  . Type II diabetes mellitus ([place])    Anti-infectives    Start     Dose/Rate Route Frequency Ordered Stop   05/06/16 1100  vancomycin (VANCOCIN) IVPB 1000 mg/200 mL premix     1,000 mg 200 mL/hr over 60 Minutes Intravenous Every 24 hours 05/06/16 0744     05/06/16 1000  cefTRIAXone (ROCEPHIN) 2 g in dextrose 5 % 50 mL IVPB     2 g 100 mL/hr over 30 Minutes Intravenous Every 24 hours 05/06/16 0743     05/06/16 0200  vancomycin (VANCOCIN) IVPB 1000 mg/200 mL premix     1,000 mg 200 mL/hr over 60 Minutes Intravenous  Once 05/06/16 0148 05/06/16 0400    05/05/16 2100  cefTRIAXone (ROCEPHIN) 1 g in dextrose 5 % 50 mL IVPB     1 g 100 mL/hr over 30 Minutes Intravenous  Once 05/05/16 2047 05/05/16 2225     Assessment: 70 yo diabetic female seen in the ED 05/04/16 for cellulitis of her right great toe. Blood cultures drawn at that time are now positive for Gram Positive cocci. Vancomycin empirically for bacteremia.  Will start 2nd dose of Vanc early since pt not given a full loading dose.    Goal of Therapy:  Vancomycin troughs 15-20 mcg/ml  Plan:   Rocephin 2gm IV q24hrs  Vancomycin 1gm IV q24hrs  Check trough at steady state  Monitor labs, progress, c/s  Hart Robinsons A, RPH 05/06/2016,12:50 PM

## 2016-05-06 NOTE — Care Management Note (Signed)
Case Management Note  Patient Details  Name: [name] MRN: [id number] Date of Birth: 04-17-1946  Subjective/Objective:                  Patient adm with cellulitis, + BC. Lives with husband, has cane PTA. Has PCP, transportation, and insurance with drug coverage. She is active with [facility] for nursing services.   Action/Plan:Will resume HH at discharge and will follow for any other needs.   Expected Discharge Date:     05/08/2016           Expected Discharge Plan:  [place]  In-House Referral:     Discharge planning Services  CM Consult  Post Acute Care Choice:  Resumption of Svcs/PTA Provider, Home Health Choice offered to:     DME Arranged:    DME Agency:     HH Arranged:    HH Agency:     Status of Service:  In process, will continue to follow  If discussed at Long Length of Stay Meetings, dates discussed:    Additional Comments:  Ashawn Rinehart, Chauncey Reading, RN 05/06/2016, 3:47 PM

## 2016-05-06 NOTE — Progress Notes (Signed)
ANTIBIOTIC CONSULT NOTE-Preliminary  Pharmacy Consult for Vancomycin Indication: Bacteremia   Allergies  Allergen Reactions  . Aspirin Nausea And Vomiting  . Ciprofloxacin Other (See Comments)    Upset Stomach  . Codeine Nausea And Vomiting    Makes me sick   . Micardis [Telmisartan] Other (See Comments)    unknown  . Nexium [Esomeprazole Magnesium] Other (See Comments)    Causes internal bleeding  . Niaspan [Niacin Er] Other (See Comments)    Reaction is unknown  . Onglyza [Saxagliptin] Other (See Comments)    Reaction is unknown  . Other     No otc pain medications  . Rofecoxib Other (See Comments)    unknown  . Simvastatin   . Tequin [Gatifloxacin] Other (See Comments)    Reaction is unknown  . Welchol [Colesevelam Hcl]   . Amoxicillin Rash    Patient Measurements: Height: 5\' 2"  (157.5 cm) Weight: 158 lb 11.7 oz (72 kg) IBW/kg (Calculated) : 50.1   Vital Signs: Temp: 98.7 F (37.1 C) (12/13 2244) Temp Source: Oral (12/13 2244) BP: 135/51 (12/13 2244) Pulse Rate: 70 (12/13 2244)  Labs:  Recent Labs  05/04/16 1854 05/05/16 1934  WBC 12.0* 10.7*  HGB 11.3* 11.2*  PLT 205 213  CREATININE 1.72* 1.65*    Estimated Creatinine Clearance: 29.5 mL/min (by C-G formula based on SCr of 1.65 mg/dL (H)).  No results for input(s): VANCOTROUGH, VANCOPEAK, VANCORANDOM, GENTTROUGH, GENTPEAK, GENTRANDOM, TOBRATROUGH, TOBRAPEAK, TOBRARND, AMIKACINPEAK, AMIKACINTROU, AMIKACIN in the last 72 hours.   Microbiology: Recent Results (from the past 720 hour(s))  Aerobic Culture (superficial specimen)     Status: None (Preliminary result)   Collection Time: 05/04/16  6:32 PM  Result Value Ref Range Status   Specimen Description FOOT RIGHT  Final   Special Requests NONE  Final   Gram Stain   Final    ABUNDANT WBC PRESENT,BOTH PMN AND MONONUCLEAR FEW GRAM POSITIVE COCCI IN PAIRS    Culture   Final    CULTURE REINCUBATED FOR BETTER GROWTH Performed at [facility]    Report Status PENDING  Incomplete  Blood culture (routine x 2)     Status: None (Preliminary result)   Collection Time: 05/04/16  6:57 PM  Result Value Ref Range Status   Specimen Description BLOOD RIGHT HAND  Final   Special Requests BOTTLES DRAWN AEROBIC AND ANAEROBIC [place]  Final   Culture  Setup Time   Final    GRAM POSITIVE COCCI ANAEROBIC BOTTLE ONLY Performed at [facility] Gram Stain Report Called to,Read Back By and Verified With: CARDWELL,L AT 1600 ON 05/05/2016 BY ISLEY,B Performed at [facility]    Culture NO GROWTH < 24 HOURS  Final   Report Status PENDING  Incomplete  Blood culture (routine x 2)     Status: None (Preliminary result)   Collection Time: 05/04/16  6:57 PM  Result Value Ref Range Status   Specimen Description RIGHT ANTECUBITAL  Final   Special Requests BOTTLES DRAWN AEROBIC AND ANAEROBIC [place]  Final   Culture  Setup Time   Final    GRAM POSITIVE COCCI IN BOTH AEROBIC AND ANAEROBIC BOTTLES Performed at [facility] Gram Stain Report Called to,Read Back By and Verified With: CARDWELL AT 8185 ON 05/05/2016 BY ISLEY,B Organism ID to follow CRITICAL RESULT CALLED TO, READ BACK BY AND VERIFIED WITHHinton Dyer RN [id number] 05/06/16 A BROWNING Performed at [facility]    Culture NO GROWTH < 24  HOURS  Final   Report Status PENDING  Incomplete  Blood Culture ID Panel (Reflexed)     Status: Abnormal   Collection Time: 05/04/16  6:57 PM  Result Value Ref Range Status   Enterococcus species NOT DETECTED NOT DETECTED Final   Listeria monocytogenes NOT DETECTED NOT DETECTED Final   Staphylococcus species DETECTED (A) NOT DETECTED Final    Comment: CRITICAL RESULT CALLED TO, READ BACK BY AND VERIFIED WITHHinton Dyer RN [phone] 05/06/16 A BROWNING    Staphylococcus aureus NOT DETECTED NOT DETECTED Final   Methicillin resistance DETECTED (A) NOT DETECTED Final    Comment: CRITICAL RESULT CALLED TO, READ BACK BY AND  VERIFIED WITHHinton Dyer RN [phone] 05/06/16 A BROWNING    Streptococcus species DETECTED (A) NOT DETECTED Final    Comment: CRITICAL RESULT CALLED TO, READ BACK BY AND VERIFIED WITHHinton Dyer RN [phone] 05/06/16 A BROWNING    Streptococcus agalactiae NOT DETECTED NOT DETECTED Final   Streptococcus pneumoniae NOT DETECTED NOT DETECTED Final   Streptococcus pyogenes NOT DETECTED NOT DETECTED Final   Acinetobacter baumannii NOT DETECTED NOT DETECTED Final   Enterobacteriaceae species NOT DETECTED NOT DETECTED Final   Enterobacter cloacae complex NOT DETECTED NOT DETECTED Final   Escherichia coli NOT DETECTED NOT DETECTED Final   Klebsiella oxytoca NOT DETECTED NOT DETECTED Final   Klebsiella pneumoniae NOT DETECTED NOT DETECTED Final   Proteus species NOT DETECTED NOT DETECTED Final   Serratia marcescens NOT DETECTED NOT DETECTED Final   Haemophilus influenzae NOT DETECTED NOT DETECTED Final   Neisseria meningitidis NOT DETECTED NOT DETECTED Final   Pseudomonas aeruginosa NOT DETECTED NOT DETECTED Final   Candida albicans NOT DETECTED NOT DETECTED Final   Candida glabrata NOT DETECTED NOT DETECTED Final   Candida krusei NOT DETECTED NOT DETECTED Final   Candida parapsilosis NOT DETECTED NOT DETECTED Final   Candida tropicalis NOT DETECTED NOT DETECTED Final    Comment: Performed at [facility]  Blood culture (routine x 2)     Status: None (Preliminary result)   Collection Time: 05/05/16  7:34 PM  Result Value Ref Range Status   Specimen Description BLOOD RIGHT HAND  Final   Special Requests BOTTLES DRAWN AEROBIC AND ANAEROBIC [facility] EACH  Final   Culture PENDING  Incomplete   Report Status PENDING  Incomplete  Blood culture (routine x 2)     Status: None (Preliminary result)   Collection Time: 05/05/16  8:08 PM  Result Value Ref Range Status   Specimen Description LEFT ANTECUBITAL  Final   Special Requests BOTTLES DRAWN AEROBIC AND ANAEROBIC [facility] EACH  Final   Culture PENDING   Incomplete   Report Status PENDING  Incomplete    Medical History: Past Medical History:  Diagnosis Date  . Anemia in chronic renal disease    Aranesp injection --  when Hg <11, last injection 12-24-14.  Marland Kitchen Anxiety   . Arthritis    knees and hand/fingers. "broke back"-being evaluated for this"weakness left leg"  . CKD (chronic kidney disease), stage III    nephrologist--  dr Mercy Moore  . Complication of anesthesia    post-op confusion   . Constipation   . Coronary artery disease   . Diabetic gastroparesis ([place])   . Diabetic retinopathy ([place])    bilateral --  monitored by dr [name]  . Diverticulosis of colon   . GAD (generalized anxiety disorder)   . GERD (gastroesophageal reflux disease)   . History of colon polyps  benign  . History of esophagitis   . History of GI bleed    upper 2009  due to esophagitis  &  2002  due to Mallory-Weiss tear  . History of gout    in issues for several years  . 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 History of rectal abscess    12-29-2004  bedside I & D  . Hyperlipidemia   . Hypertension   . Hypothyroidism   . LAFB (left anterior fascicular block)   . Right bundle branch block   . Sacral decubitus ulcer    since 2014- 01-02-15 remains with wound" gauze dressing changes daily.  . Type II diabetes mellitus (HCC)     Medications:  Ceftriaxone 1 Gm IV in the ED 05/05/16  Assessment: 70 yo diabetic female seen in the ED 05/04/16 for cellulitis of her right great toe. Blood cultures drawn at that time are now positive for Gram Positive cocci. Vancomycin empirically for bacteremia.  Goal of Therapy:  Vancomycin troughs 15-20 mcg/ml  Plan:  Preliminary review of pertinent patient information completed.  Protocol will be initiated with a one-time dose of Vancomycin 1 Gm IV.  Forestine Na clinical pharmacist will complete review during morning rounds to assess patient and finalize treatment regimen.  Norberto Sorenson,  [facility] 05/06/2016,1:49 AM

## 2016-05-06 NOTE — Care Management Obs Status (Signed)
[place] NOTIFICATION   Patient Details  Name: [name] MRN: [id number] Date of Birth: 1946/02/09   Medicare Observation Status Notification Given:  Yes    Farheen Pfahler, Chauncey Reading, RN 05/06/2016, 3:55 PM

## 2016-05-06 NOTE — Progress Notes (Signed)
*  PRELIMINARY RESULTS* Echocardiogram 2D Echocardiogram has been performed.  Leavy Cella 05/06/2016, 2:30 PM

## 2016-05-06 NOTE — Progress Notes (Signed)
TEE requested by primary team. Patient with MRSA bacteremia. Will obtain TTE today, if no gross findings plan for TEE tomorrow.    Zandra Abts MD

## 2016-05-06 NOTE — Consult Note (Signed)
SURGICAL CONSULTATION NOTE (initial) - cpt: [id number]  HISTORY OF PRESENT ILLNESS (HPI):  70 y.o. female presented to [facility] ED 2 days ago for Right 1st toe painful erythema and swelling at MTP joint x "a few" days. Blood cultures were obtained, and patient was discharged home. She was, however, called to return when blood cultures were found to be growing MRSA, for which patient has been admitted and started on antibiotics. Patient reports the pain and erythema have both improved, and she denies fever/chills, CP, or SOB.  Surgery is consulted by medical physician Dr. Candiss Norse in this context for evaluation and management of pain Right foot wound.  PAST MEDICAL HISTORY (PMH):  Past Medical History:  Diagnosis Date  . Anemia in chronic renal disease    Aranesp injection --  when Hg <11, last injection 12-24-14.  Marland Kitchen Anxiety   . Arthritis    knees and hand/fingers. "broke back"-being evaluated for this"weakness left leg"  . CKD (chronic kidney disease), stage III    nephrologist--  dr Mercy Moore  . Complication of anesthesia    post-op confusion   . Constipation   . Coronary artery disease   . Diabetic gastroparesis ([place])   . Diabetic retinopathy ([place])    bilateral --  monitored by dr [name]  . Diverticulosis of colon   . GAD (generalized anxiety disorder)   . GERD (gastroesophageal reflux disease)   . History of colon polyps    benign  . History of esophagitis   . History of GI bleed    upper 2009  due to esophagitis  &  2002  due to Mallory-Weiss tear  . History of gout    in issues for several years  . 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 History of rectal abscess    12-29-2004  bedside I & D  . Hyperlipidemia   . Hypertension   . Hypothyroidism   . LAFB (left anterior fascicular block)   . Right bundle branch block   . Sacral decubitus ulcer    since 2014- 01-02-15 remains with wound" gauze dressing changes daily.  . Type II diabetes mellitus ([place])      PAST  SURGICAL HISTORY ([place]):  Past Surgical History:  Procedure Laterality Date  . APPLICATION OF A-CELL OF EXTREMITY N/A 04/07/2015   Procedure: A CELL PLACMENT;  Surgeon: Loel Lofty Dillingham, DO;  Location: [place];  Service: Plastics;  Laterality: N/A;  . CARDIOVASCULAR STRESS TEST  12-30-2004   normal perfusion study/  no ischemia or infartion/  normal LV wall function and wall motion , ef 66%  . CATARACT EXTRACTION W/ INTRAOCULAR LENS  IMPLANT, BILATERAL  1995  . COLONOSCOPY W/ POLYPECTOMY  last one 2008  . COMPRESSION HIP SCREW Right 05/18/2014   Procedure: COMPRESSION HIP;  Surgeon: Carole Civil, MD;  Location: [facility];  Service: Orthopedics;  Laterality: Right;  . ESOPHAGOGASTRODUODENOSCOPY  last one 01-09-2011  . EVALUATION UNDER ANESTHESIA WITH FISTULECTOMY N/A 01/06/2015   Procedure: EXAM UNDER ANESTHESIA , placement of seton;  Surgeon: Jackolyn Confer, MD;  Location: [facility];  Service: General;  Laterality: N/A;  . I&D EXTREMITY N/A 04/07/2015   Procedure: IRRIGATION AND DEBRIDEMENT ISCHIAL ULCER;  Surgeon: Loel Lofty Dillingham, DO;  Location: [place];  Service: Plastics;  Laterality: N/A;  . INCISION AND DRAINAGE OF WOUND N/A 09/30/2014   Procedure: IRRIGATION AND DEBRIDEMENT SACRAL WOUND, EXCISION OF PERIRECTAL TRACT WITH PLACEMENT OF ACCELL;  Surgeon: Theodoro Kos, DO;  Location: [place];  Service: Plastics;  Laterality: N/A;  . ORIF FEMUR FRACTURE Left 10/09/2012   Procedure: OPEN REDUCTION INTERNAL FIXATION (ORIF) DISTAL FEMUR FRACTURE;  Surgeon: Rozanna Box, MD;  Location: [place];  Service: Orthopedics;  Laterality: Left;  . RETINOPATHY SURGERY Bilateral 1980's?  . TRANSTHORACIC ECHOCARDIOGRAM  02-18-2011   mild LVH,  ef 55-60%,  grade I diastolic dysfunction/  mild TR/  RV systolic pressure increased consistant with moderate pulmonary hypertension     MEDICATIONS:  Prior to Admission medications   Medication Sig Start Date End Date Taking? Authorizing  Provider  amLODipine (NORVASC) 5 MG tablet Take 1 tablet (5 mg total) by mouth daily. 03/05/16  Yes Eustaquio Maize, MD  amLODipine (NORVASC) 5 MG tablet Take 5 mg by mouth daily.   Yes Historical Provider, MD  atorvastatin (LIPITOR) 40 MG tablet Take 1 tablet (40 mg total) by mouth every evening. 03/05/16  Yes Eustaquio Maize, MD  clindamycin (CLEOCIN) 300 MG capsule Take 1 capsule (300 mg total) by mouth 3 (three) times daily. 05/04/16  Yes Virgel Manifold, MD  insulin aspart protamine - aspart (NOVOLOG MIX 70/30 FLEXPEN) (70-30) 100 UNIT/ML FlexPen Inject 0.08 mLs (8 Units total) into the skin daily with breakfast. Only if blood glucose is above 90 09/11/15  Yes Cassandria Anger, MD  levothyroxine (SYNTHROID, LEVOTHROID) 75 MCG tablet Take 1 tablet (75 mcg total) by mouth daily before breakfast. 03/05/16  Yes Eustaquio Maize, MD  metoprolol succinate (TOPROL-XL) 50 MG 24 hr tablet Take 1 tablet (50 mg total) by mouth every morning. 03/05/16  Yes Eustaquio Maize, MD  Blood Glucose Monitoring Suppl (ACCU-CHEK AVIVA PLUS) w/Device KIT Use as directed 4 times daily 07/21/15   Cassandria Anger, MD  glucose blood (ACCU-CHEK AVIVA) test strip Use as instructed 4 x daily. E11.65 04/19/16   Cassandria Anger, MD  Insulin Pen Needle (UNIFINE PENTIPS) 32G X 4 MM MISC Use as directed bid. E11.65 04/19/16   Cassandria Anger, MD  Lancets (ACCU-CHEK SOFT TOUCH) lancets Use as instructed 4 x daily, E11.65 04/19/16   Cassandria Anger, MD  losartan (COZAAR) 100 MG tablet Take 1 tablet (100 mg total) by mouth daily. Patient not taking: Reported on 05/04/2016 03/05/16   Eustaquio Maize, MD  pantoprazole (PROTONIX) 20 MG tablet Take 1 tablet (20 mg total) by mouth daily. Patient not taking: Reported on 05/04/2016 03/05/16   Eustaquio Maize, MD  UNIFINE PENTIPS 32G X 4 MM MISC  05/14/15   Historical Provider, MD     ALLERGIES:  Allergies  Allergen Reactions  . Aspirin Nausea And Vomiting  .  Ciprofloxacin Other (See Comments)    Upset Stomach  . Codeine Nausea And Vomiting    Makes me sick   . Micardis [Telmisartan] Other (See Comments)    unknown  . Nexium [Esomeprazole Magnesium] Other (See Comments)    Causes internal bleeding  . Niaspan [Niacin Er] Other (See Comments)    Reaction is unknown  . Onglyza [Saxagliptin] Other (See Comments)    Reaction is unknown  . Other     No otc pain medications  . Rofecoxib Other (See Comments)    unknown  . Simvastatin   . Tequin [Gatifloxacin] Other (See Comments)    Reaction is unknown  . Welchol [Colesevelam Hcl]   . Amoxicillin Rash     SOCIAL HISTORY:  Social History   Social History  . Marital status: Married  Spouse name: [name]  . Number of children: 0  . Years of education: 12   Occupational History  . Retired Retired   Social History Main Topics  . Smoking status: Never Smoker  . Smokeless tobacco: Never Used  . Alcohol use No  . Drug use: No  . Sexual activity: Not Currently   Other Topics Concern  . Not on file   Social History Narrative   Lives with husband.    Caffeine use: none    FAMILY HISTORY:  Family History  Problem Relation Age of Onset  . Colon cancer Father   . Prostate cancer Father   . Diabetes Father   . Coronary artery disease Mother 14  . Heart disease Mother   . Diabetes Mother   . Coronary artery disease Sister 76  . Diabetes Sister   . Heart disease Sister   . Diabetes Sister   . Diabetes Sister   . Diabetes Sister   . Diabetes Sister   . Diabetes Sister   . Diabetes Maternal Grandmother     REVIEW OF SYSTEMS:  Constitutional: denies weight loss, fever, chills, or sweats  Eyes: denies any other vision changes, history of eye injury  ENT: denies sore throat, hearing problems  Respiratory: denies shortness of breath, wheezing  Cardiovascular: denies chest pain, palpitations  Gastrointestinal: denies abdominal pain, N/V, or diarrhea Genitourinary: denies burning  with urination or urinary frequency Musculoskeletal: denies any other joint pains or cramps except as per HPI Skin: denies any other rashes or skin discolorations except as per HPI Neurological: denies any other headache, dizziness, weakness  Psychiatric: denies any other depression, anxiety   All other review of systems were negative   VITAL SIGNS:  Temp:  [98.2 F (36.8 C)-98.7 F (37.1 C)] 98.6 F (37 C) (12/14 0608) Pulse Rate:  [64-75] 65 (12/14 0608) Resp:  [16-19] 18 (12/14 0608) BP: (109-135)/(48-78) 118/48 (12/14 0608) SpO2:  [97 %-100 %] 97 % (12/14 0608) Weight:  [72 kg (158 lb 11.7 oz)-72.1 kg (159 lb)] 72 kg (158 lb 11.7 oz) (12/13 2244)     Height: _0  (157.5 cm) Weight: 72 kg (158 lb 11.7 oz) BMI (Calculated): 29.1   INTAKE/OUTPUT:  This shift: No intake/output data recorded.  Last 2 shifts: _1 @   PHYSICAL EXAM:  Constitutional:  -- Normal body habitus  -- Awake, alert, and oriented x3  Eyes:  -- Pupils equally round and reactive to light  -- No scleral icterus  Ear, nose, and throat:  -- No jugular venous distension  Pulmonary:  -- No crackles  -- Equal breath sounds bilaterally -- Breathing non-labored at rest Cardiovascular:  -- S1, S2 present  -- No pericardial rubs Gastrointestinal:  -- Abdomen soft, nontender, nondistended, no guarding/rebound  -- No abdominal masses appreciated, pulsatile or otherwise  Musculoskeletal and Integumentary:  -- Wounds or skin discoloration: ulcerated, tender, and erythematous Right 1st toe at MTP joint with caseous (white cheesey) wound exudate; area of erythema significantly smaller than as marked by wound care team earlier today -- Extremities: B/L UE and LE FROM, hands and feet warm, focal Right 1st toe edema  Neurologic:  -- Motor function: intact and symmetric -- Sensation: intact and symmetric  Pulse/Doppler Exam: (p=palpable; d=doppler signals; 0=none)     Right   Left   Fem  p   p   DP  p       weakly palpable   Labs:  CBC:  Lab Results  Component Value Date  WBC 11.3 (H) 05/06/2016   RBC 3.45 (L) 05/06/2016   HEMOGLOBIN 11.4 07/09/2015   BMP:  Lab Results  Component Value Date   GLUCOSE 157 (H) 05/06/2016   CO2 22 05/06/2016   BUN 31 (H) 05/06/2016   BUN 29 (H) 03/11/2016   CREATININE 1.48 (H) 05/06/2016   CREATININE 1.70 (H) 09/11/2012   CALCIUM 8.6 (L) 05/06/2016   CALCIUM 7.0 (L) 10/01/2014   Serum uric acid level (05/06/2016): 8.4  Imaging studies:  MRI Right Foot (05/06/2016) 1. 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 2. In the plantar medial aspect of the great toe there is a 3.4 x 4.2 x 3.8 cm T1 hypointense and heterogeneously T2 hyperintense soft tissue abnormality at the level of the first MTP joint. This abnormality is different in signal compared with the remainder of the midfoot and forefoot abnormalities. This may reflect tophaceous gout versus phlegmon with a small abscess. No focal marrow signal abnormality or bone destruction to suggest osteomyelitis.  Right Foot X-ray (05/05/2016) Mentioned in the body, but not mentioned in the impression is severe osteolysis of the third middle and distal phalanx which may be secondary to infection or gout.  Assessment/Plan: (ICD-10's: M36.071) 70 y.o. 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   - no surgical intervention indicated at this time  - okay to continue with daily silvadene application, wound care  - check bilateral ABI's for baseline in diabetic patient with abnormal pulse exam  - renally dosed Cochicine/NSAIDs and  renal-protective uric acid reduction per medical team  - antibiotics and medical management of comorbidities as per medical team  - frequent repositioning to reduce risk of pressure sore formation  - continue to monitor for improvement of gout and wound  - twice daily routine foot checks regardless of gout  - outpatient follow-up with podiatrist  - DVT prophylaxis  All of the above findings and recommendations were discussed with the patient and her family, and all of patient's and her family's questions were answered to their expressed satisfaction.  Thank you for the opportunity to participate in this patient's care.   -- Marilynne Drivers Rosana Hoes, MD, [place]: [facility] Office: [phone]

## 2016-05-06 NOTE — Progress Notes (Signed)
Results for EARSIE, HUMM (MRN [id number]) as of 05/06/2016 08:42  Ref. Range 05/04/2016 17:20 05/04/2016 20:05 05/05/2016 17:58 05/05/2016 22:21 05/06/2016 08:00  Glucose-Capillary Latest Ref Range: 65 - 99 mg/dL 142 (H) 164 (H) 260 (H) 254 (H) 136 (H)   Noted patient's CBGs. May want to consider starting her home dose of insulin:70/30 mixed insulin 8 units with breakfast and continuing the Novolog SENSITIVE correction scale TID & HS while in the hospital. Harvel Ricks RN BSN CDE

## 2016-05-06 NOTE — Consult Note (Signed)
[facility] Nurse wound consult note Reason for Consult: right foot ulcer History gout and DM  Wound type: full thickness ulceration right first metatarsal head medial presents like gouty foot ulceration Measurement: 1.0cm x 1.5cm x 0.3cm  Wound bed: white, cheesy like discharge (? Gout), soft. Unable to visualize base.  Drainage (amount, consistency, odor) minimal, but cheesy like and white, no odor Periwound: intense erythema, marked by [facility] nurse today Dressing procedure/placement/frequency: Cover with soft silicone foam for now, surgery to see this patient today.  I will shadow along with them to determine if she does not receive any surgical intervention to order more aggressive topical care.  Trevonte Ashkar [facility] MSN, [place], [employer], [place]

## 2016-05-07 ENCOUNTER — Encounter (HOSPITAL_COMMUNITY)
Admission: EM | Disposition: A | Discharge status: Home Health | Payer: Self-pay | Source: Home / Self Care | Attending: Internal Medicine

## 2016-05-07 ENCOUNTER — Inpatient Hospital Stay (HOSPITAL_COMMUNITY): Payer: Medicare Other

## 2016-05-07 LAB — GLUCOSE, CAPILLARY
GLUCOSE-CAPILLARY: 265 mg/dL — AB (ref 65–99)
GLUCOSE-CAPILLARY: 252 mg/dL — AB (ref 65–99)
GLUCOSE-CAPILLARY: 196 mg/dL — AB (ref 65–99)
Glucose-Capillary: 128 mg/dL — ABNORMAL HIGH (ref 65–99)

## 2016-05-07 LAB — BASIC METABOLIC PANEL
Anion gap: 7 (ref 5–15)
BUN: 25 mg/dL — ABNORMAL HIGH (ref 6–20)
CHLORIDE: 111 mmol/L (ref 101–111)
CO2: 20 mmol/L — AB (ref 22–32)
CREATININE: 1.58 mg/dL — AB (ref 0.44–1.00)
Calcium: 8.2 mg/dL — ABNORMAL LOW (ref 8.9–10.3)
GFR calc non Af Amer: 32 mL/min — ABNORMAL LOW (ref >60)
GFR, EST AFRICAN AMERICAN: 37 mL/min — AB (ref >60)
Glucose, Bld: 183 mg/dL — ABNORMAL HIGH (ref 65–99)
Potassium: 5.1 mmol/L (ref 3.5–5.1)
Sodium: 138 mmol/L (ref 135–145)

## 2016-05-07 LAB — CBC
HCT: 29.9 % — ABNORMAL LOW (ref 36.0–46.0)
Hemoglobin: 9.9 g/dL — ABNORMAL LOW (ref 12.0–15.0)
MCH: 30.3 pg (ref 26.0–34.0)
MCHC: 33.1 g/dL (ref 30.0–36.0)
MCV: 91.4 fL (ref 78.0–100.0)
Platelets: 192 K/uL (ref 150–400)
RBC: 3.27 MIL/uL — ABNORMAL LOW (ref 3.87–5.11)
RDW: 12.5 % (ref 11.5–15.5)
WBC: 9.3 K/uL (ref 4.0–10.5)

## 2016-05-07 LAB — HEMOGLOBIN A1C
Hgb A1c MFr Bld: 6.6 % — ABNORMAL HIGH (ref 4.8–5.6)
Mean Plasma Glucose: 143 mg/dL

## 2016-05-07 SURGERY — ECHOCARDIOGRAM, TRANSESOPHAGEAL
Anesthesia: Moderate Sedation

## 2016-05-07 MED ORDER — SILVER SULFADIAZINE 1 % EX CREA
TOPICAL_CREAM | Freq: Two times a day (BID) | CUTANEOUS | Status: DC
  Administered 2016-05-07 – 2016-05-08 (×4): via TOPICAL
  Filled 2016-05-07: qty 50

## 2016-05-07 NOTE — Progress Notes (Signed)
Inpatient Diabetes Program Recommendations  AACE/ADA: New Consensus Statement on Inpatient Glycemic Control (2015)  Target Ranges:  Prepandial:   less than 140 mg/dL      Peak postprandial:   less than 180 mg/dL (1-2 hours)      Critically ill patients:  140 - 180 mg/dL   Lab Results  Component Value Date   GLUCAP 128 (H) 05/07/2016   HGBA1C 6.6 (H) 05/05/2016   Results for ELVERDA, WENDEL (MRN [id number]) as of 05/07/2016 09:00  Ref. Range 05/05/2016 17:58 05/05/2016 22:21 05/06/2016 08:00 05/06/2016 11:52 05/06/2016 16:39 05/06/2016 20:51 05/07/2016 07:46  Glucose-Capillary Latest Ref Range: 65 - 99 mg/dL 260 (H) 254 (H) 136 (H) 181 (H) 252 (H) 155 (H) 128 (H)   Review of Glycemic Control  Current orders for Inpatient glycemic control: Novolog 0-9 units [facility]  Inpatient Diabetes Program Recommendations:   Noted that patient was taking Novolog Mix 70/30 at home of 8 units BID, please consider continuing this home dose while inpatient to improve glycemic control.  Thank you,  Windy Carina, RN, BSN Diabetes Coordinator Inpatient Diabetes Program [phone] (Team Pager)

## 2016-05-07 NOTE — Consult Note (Signed)
WOC follow up this am, reviewed surgery consult notes. Orders for wound care updated based on Dr. Rosana Hoes note and recommendations.    Re consult if needed, will not follow at this time. Thanks  Bartosz Luginbill [employer], RN,CWOCN, CNS [phone])

## 2016-05-07 NOTE — Progress Notes (Signed)
PROGRESS NOTE                                                                                                                                                                                                             Patient Demographics:    [name], is a 70 y.o. female, DOB - Aug 13, 1945, OMB:[id number]  Admit date - 05/05/2016   Admitting Physician Oswald Hillock, MD  Outpatient Primary MD for the patient is Eustaquio Maize, MD  LOS - 1  Chief Complaint  Patient presents with  . Foot Pain       Brief Narrative   [name]  is a 70 y.o. female, With history of gout, diabetes mellitus who came to the hospital with worsening redness and swelling of the MTP joint of right big toe for past few days. Patient was seen in the ED yesterday and diagnosed with cellulitis, along with gouty arthritis. Patient was sent home after drawing blood cultures. Patient was called back to the ED, as blood cultures are growing gram-positive cocci in 2 out of 2 bottles.   Subjective:    [name] today has, No headache, No chest pain, No abdominal pain - No Nausea, No new weakness tingling or numbness, No Cough - SOB.     Assessment  & Plan :    1.Right toe ulcer with surrounding cellulitis. Discussed with lab on 05/07/2016 blood cultures from 05/04/2016 . 2. Growing strep and coag-negative staph, continue vancomycin and Rocephin and final culture sensitivity data is back, discussed with ID physician Dr. Linus Salmons as well on 05/07/2016, likely safe to transition to oral Keflex if cultures are satisfactory.  Gen. surgery following, MRI of the foot does not show osteomyelitis, likely conservative care for now.   2. Dyslipidemia. On statin continue.  3. Hypothyroidism. Continue Synthroid.  4. Hypertension. Continue beta blocker and Norvasc at home dose.  5. DM type II. Currently on sliding scale.  Lab Results  Component Value  Date   HGBA1C 6.6 (H) 05/05/2016   CBG (last 3)   Recent Labs  05/06/16 1639 05/06/16 2051 05/07/16 0746  GLUCAP 252* 155* 128*    Family Communication  :  None  Code Status :  Full  Diet : Diet heart healthy/carb modified Room service appropriate? Yes; Fluid consistency: Thin   Disposition Plan  :  Home in 1-2 days  Consults  :  Surgey, ID Dr. Linus Salmons over the phone on 05/07/2016  Procedures  :    MRI of the right foot. Right big toe ulcer with possible surrounding cellulitis  DVT Prophylaxis  :  Lovenox   Lab Results  Component Value Date   PLT 192 05/07/2016    Inpatient Medications  Scheduled Meds: . amLODipine  5 mg Oral Daily  . atorvastatin  40 mg Oral QPM  . cefTRIAXone (ROCEPHIN)  IV  2 g Intravenous Q24H  . enoxaparin (LOVENOX) injection  40 mg Subcutaneous Q24H  . insulin aspart  0-9 Units Subcutaneous TID WC  . levothyroxine  75 mcg Oral QAC breakfast  . metoprolol succinate  50 mg Oral q morning - 10a  . pantoprazole  40 mg Oral Daily  . vancomycin  1,000 mg Intravenous Q24H   Continuous Infusions: . sodium chloride 75 mL/hr at 05/06/16 2053   PRN Meds:.acetaminophen **OR** acetaminophen, ondansetron **OR** ondansetron (ZOFRAN) IV  Antibiotics  :    Anti-infectives    Start     Dose/Rate Route Frequency Ordered Stop   05/06/16 1100  vancomycin (VANCOCIN) IVPB 1000 mg/200 mL premix     1,000 mg 200 mL/hr over 60 Minutes Intravenous Every 24 hours 05/06/16 0744     05/06/16 1000  cefTRIAXone (ROCEPHIN) 2 g in dextrose 5 % 50 mL IVPB     2 g 100 mL/hr over 30 Minutes Intravenous Every 24 hours 05/06/16 0743     05/06/16 0200  vancomycin (VANCOCIN) IVPB 1000 mg/200 mL premix     1,000 mg 200 mL/hr over 60 Minutes Intravenous  Once 05/06/16 0148 05/06/16 0400   05/05/16 2100  cefTRIAXone (ROCEPHIN) 1 g in dextrose 5 % 50 mL IVPB     1 g 100 mL/hr over 30 Minutes Intravenous  Once 05/05/16 2047 05/05/16 2225         Objective:   Vitals:     05/05/16 2244 05/06/16 0608 05/06/16 2053 05/07/16 0534  BP: (!) 135/51 (!) 118/48 (!) 126/55 (!) 123/49  Pulse: 70 65 65 64  Resp: 18 18 18 18   Temp: 98.7 F (37.1 C) 98.6 F (37 C) 98.2 F (36.8 C) 98.8 F (37.1 C)  TempSrc: Oral Oral Oral Oral  SpO2: 100% 97% 100% 100%  Weight: 72 kg (158 lb 11.7 oz)     Height:        Wt Readings from Last 3 Encounters:  05/05/16 72 kg (158 lb 11.7 oz)  05/04/16 72.1 kg (159 lb)  03/11/16 73 kg (161 lb)     Intake/Output Summary (Last 24 hours) at 05/07/16 0908 Last data filed at 05/07/16 0754  Gross per 24 hour  Intake          1208.75 ml  Output              800 ml  Net           408.75 ml     Physical Exam  Awake Alert, Oriented X 3, No new F.N deficits, Normal affect [place].AT,PERRAL Supple Neck,No JVD, No cervical lymphadenopathy appriciated.  Symmetrical Chest wall movement, Good air movement bilaterally, CTAB RRR,No Gallops,Rubs or new Murmurs, No Parasternal Heave +ve B.Sounds, Abd Soft, No tenderness, No organomegaly appriciated, No rebound - guarding or rigidity. No Cyanosis, Clubbing or edema, No new Rash or bruise  1st R MTP has a small open wound with minimal drainage and surrounding erythema    Data  Review:    CBC  Recent Labs Lab 05/04/16 1854 05/05/16 1934 05/06/16 0637 05/07/16 0503  WBC 12.0* 10.7* 11.3* 9.3  HGB 11.3* 11.2* 10.4* 9.9*  HCT 34.4* 34.3* 31.2* 29.9*  PLT 205 213 207 192  MCV 90.3 90.5 90.4 91.4  MCH 29.7 29.6 30.1 30.3  MCHC 32.8 32.7 33.3 33.1  RDW 12.6 12.6 12.6 12.5  LYMPHSABS 1.8  --   --   --   MONOABS 1.1*  --   --   --   EOSABS 0.1  --   --   --   BASOSABS 0.0  --   --   --     Chemistries   Recent Labs Lab 05/04/16 1854 05/05/16 1934 05/06/16 0637 05/07/16 0503  NA 133* 132* 134* 138  K 5.1 5.1 4.8 5.1  CL 104 104 106 111  CO2 21* 20* 22 20*  GLUCOSE 155* 330* 157* 183*  BUN 36* 34* 31* 25*  CREATININE 1.72* 1.65* 1.48* 1.58*  CALCIUM 9.1 8.5* 8.6* 8.2*   AST  --   --  12*  --   ALT  --   --  7*  --   ALKPHOS  --   --  82  --   BILITOT  --   --  0.5  --    ------------------------------------------------------------------------------------------------------------------ No results for input(s): CHOL, HDL, LDLCALC, TRIG, CHOLHDL, LDLDIRECT in the last 72 hours.  Lab Results  Component Value Date   HGBA1C 6.6 (H) 05/05/2016   ------------------------------------------------------------------------------------------------------------------ No results for input(s): TSH, T4TOTAL, T3FREE, THYROIDAB in the last 72 hours.  Invalid input(s): FREET3 ------------------------------------------------------------------------------------------------------------------ No results for input(s): VITAMINB12, FOLATE, FERRITIN, TIBC, IRON, RETICCTPCT in the last 72 hours.  Coagulation profile No results for input(s): INR, PROTIME in the last 168 hours.  No results for input(s): DDIMER in the last 72 hours.  Cardiac Enzymes No results for input(s): CKMB, TROPONINI, MYOGLOBIN in the last 168 hours.  Invalid input(s): CK ------------------------------------------------------------------------------------------------------------------ No results found for: BNP  Micro Results Recent Results (from the past 240 hour(s))  Aerobic Culture (superficial specimen)     Status: None (Preliminary result)   Collection Time: 05/04/16  6:32 PM  Result Value Ref Range Status   Specimen Description FOOT RIGHT  Final   Special Requests NONE  Final   Gram Stain   Final    ABUNDANT WBC PRESENT,BOTH PMN AND MONONUCLEAR FEW GRAM POSITIVE COCCI IN PAIRS    Culture   Final    ABUNDANT STAPHYLOCOCCUS AUREUS SUSCEPTIBILITIES TO FOLLOW Performed at [name]    Report Status PENDING  Incomplete  Blood culture (routine x 2)     Status: None (Preliminary result)   Collection Time: 05/04/16  6:57 PM  Result Value Ref Range Status   Specimen Description BLOOD  RIGHT HAND  Final   Special Requests BOTTLES DRAWN AEROBIC AND ANAEROBIC [place]  Final   Culture  Setup Time   Final    GRAM POSITIVE COCCI ANAEROBIC BOTTLE ONLY Performed at [facility] Gram Stain Report Called to,Read Back By and Verified With: CARDWELL,L AT 1600 ON 05/05/2016 BY ISLEY,B    Culture   Final    GRAM POSITIVE COCCI CULTURE REINCUBATED FOR BETTER GROWTH Performed at [facility]    Report Status PENDING  Incomplete  Blood culture (routine x 2)     Status: None (Preliminary result)   Collection Time: 05/04/16  6:57 PM  Result Value Ref Range Status   Specimen  Description RIGHT ANTECUBITAL  Final   Special Requests BOTTLES DRAWN AEROBIC AND ANAEROBIC [facility] EACH  Final   Culture  Setup Time   Final    GRAM POSITIVE COCCI IN BOTH AEROBIC AND ANAEROBIC BOTTLES Performed at [facility] Gram Stain Report Called to,Read Back By and Verified With: CARDWELL AT 9675 ON 05/05/2016 BY ISLEY,B CRITICAL RESULT CALLED TO, READ BACK BY AND VERIFIED WITHHinton Dyer RN [id number] 05/06/16 A BROWNING Performed at [place]  Final   Report Status PENDING  Incomplete  Blood Culture ID Panel (Reflexed)     Status: Abnormal   Collection Time: 05/04/16  6:57 PM  Result Value Ref Range Status   Enterococcus species NOT DETECTED NOT DETECTED Final   Listeria monocytogenes NOT DETECTED NOT DETECTED Final   Staphylococcus species DETECTED (A) NOT DETECTED Final    Comment: CRITICAL RESULT CALLED TO, READ BACK BY AND VERIFIED WITHHinton Dyer RN 0101 05/06/16 A BROWNING    Staphylococcus aureus NOT DETECTED NOT DETECTED Final   Methicillin resistance DETECTED (A) NOT DETECTED Final    Comment: CRITICAL RESULT CALLED TO, READ BACK BY AND VERIFIED WITHHinton Dyer RN [phone] 05/06/16 A BROWNING    Streptococcus species DETECTED (A) NOT DETECTED Final    Comment: CRITICAL RESULT CALLED TO, READ BACK BY AND VERIFIED WITHHinton Dyer RN  [phone] 05/06/16 A BROWNING    Streptococcus agalactiae NOT DETECTED NOT DETECTED Final   Streptococcus pneumoniae NOT DETECTED NOT DETECTED Final   Streptococcus pyogenes NOT DETECTED NOT DETECTED Final   Acinetobacter baumannii NOT DETECTED NOT DETECTED Final   Enterobacteriaceae species NOT DETECTED NOT DETECTED Final   Enterobacter cloacae complex NOT DETECTED NOT DETECTED Final   Escherichia coli NOT DETECTED NOT DETECTED Final   Klebsiella oxytoca NOT DETECTED NOT DETECTED Final   Klebsiella pneumoniae NOT DETECTED NOT DETECTED Final   Proteus species NOT DETECTED NOT DETECTED Final   Serratia marcescens NOT DETECTED NOT DETECTED Final   Haemophilus influenzae NOT DETECTED NOT DETECTED Final   Neisseria meningitidis NOT DETECTED NOT DETECTED Final   Pseudomonas aeruginosa NOT DETECTED NOT DETECTED Final   Candida albicans NOT DETECTED NOT DETECTED Final   Candida glabrata NOT DETECTED NOT DETECTED Final   Candida krusei NOT DETECTED NOT DETECTED Final   Candida parapsilosis NOT DETECTED NOT DETECTED Final   Candida tropicalis NOT DETECTED NOT DETECTED Final    Comment: Performed at [facility]  Blood culture (routine x 2)     Status: None (Preliminary result)   Collection Time: 05/05/16  7:34 PM  Result Value Ref Range Status   Specimen Description BLOOD RIGHT HAND  Final   Special Requests BOTTLES DRAWN AEROBIC AND ANAEROBIC [place]  Final   Culture NO GROWTH 2 DAYS  Final   Report Status PENDING  Incomplete  Wound or Superficial Culture     Status: None (Preliminary result)   Collection Time: 05/05/16  7:40 PM  Result Value Ref Range Status   Specimen Description FOOT WOUND  Final   Special Requests NONE  Final   Gram Stain   Final    RARE WBC PRESENT, PREDOMINANTLY PMN MODERATE GRAM POSITIVE COCCI IN PAIRS AND CHAINS Performed at [facility]    Culture PENDING  Incomplete   Report Status PENDING  Incomplete  Blood culture (routine x 2)     Status: None  (Preliminary result)   Collection Time: 05/05/16  8:08 PM  Result Value Ref Range Status   Specimen Description LEFT ANTECUBITAL  Final   Special Requests BOTTLES DRAWN AEROBIC AND ANAEROBIC [place]  Final   Culture NO GROWTH 2 DAYS  Final   Report Status PENDING  Incomplete    Radiology Reports Mr Foot Right Wo Contrast  Result Date: 05/06/2016 CLINICAL DATA:  Pain, redness of the entire right foot for 2 months EXAM: MRI OF THE RIGHT FOREFOOT WITHOUT CONTRAST TECHNIQUE: Multiplanar, multisequence MR imaging of the right forefoot was performed. No intravenous contrast was administered. COMPARISON:  None. FINDINGS: Bones/Joint/Cartilage No acute fracture or dislocation. Multiple erosions involving the base of the second, third and fourth metatarsals. Small erosions involving the medial cuneiform, middle cuneiform, lateral cuneiform, cuboid, medial distal navicular and distal lateral calcaneus. Large erosion involving the lateral aspect of the fifth metatarsal head and neck. Adjacent to the erosions there are T1 and T2 low signal nodular soft tissue abnormality. 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 In the plantar medial aspect of the great toe there is a 3.4 x 4.2 x 3.8 cm T1 hypointense and heterogeneously T2 hyperintense soft tissue abnormality at the level of the first MTP joint. No underlying bone destruction. Overall normal alignment. No significant joint effusion. Moderate osteoarthritis of the first MTP joint. Mild osteoarthritis of the first IP joint. Ligaments Collateral ligaments are intact. Muscles and Tendons Muscles are normal. Flexor, extensor and peroneal tendons are grossly intact. Soft tissues No other fluid collection or hematoma. IMPRESSION: 1. Multiple erosions involving the midfoot and forefoot with associated low signal nodular soft tissue abnormality most concerning for crystalline arthropathy  such as gout. 2. In the plantar medial aspect of the great toe there is a 3.4 x 4.2 x 3.8 cm T1 hypointense and heterogeneously T2 hyperintense soft tissue abnormality at the level of the first MTP joint. This abnormality is different in signal compared with the remainder of the midfoot and forefoot abnormalities. This may reflect tophaceous gout versus phlegmon with a small abscess. No focal marrow signal abnormality or bone destruction to suggest osteomyelitis. Electronically Signed   By: Kathreen Devoid   On: 05/06/2016 09:56   Dg Foot Complete Right  Addendum Date: 05/05/2016   ADDENDUM REPORT: 05/05/2016 20:01 ADDENDUM: Mentioned in the body, but not mentioned in the impression is severe osteolysis of the third middle and distal phalanx which may be secondary to infection or gout. Electronically Signed   By: Kathreen Devoid   On: 05/05/2016 20:01   Result Date: 05/05/2016 CLINICAL DATA:  Draining ulcer of the right great toe. EXAM: RIGHT FOOT COMPLETE - 3+ VIEW COMPARISON:  None. FINDINGS: No acute fracture or dislocation. Severe generalized osteopenia. Large periarticular erosion involving the lateral head of the fifth metatarsal as can be seen with crystalline arthropathy such as gout. Severe soft tissue swelling along the medial aspect of the first MTP joint. No definite erosive changes or bone destruction. Moderate osteoarthritis of the first MTP joint. Mild osteoarthritis of the first IP joint. 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 Severe degenerative changes of the tarsometatarsal joints throughout the right foot. Plantar calcaneal spur with calcification of the plantar fascia. Enthesopathic changes of the Achilles tendon insertion. Peripheral vascular atherosclerotic disease. IMPRESSION: 1. Severe soft tissue swelling along the medial aspect of the first MTP joint concerning for severe cellulitis. No definite erosive changes or bone destruction to suggest osteomyelitis. If there is  further clinical concern, recommend MRI of the right foot. 2. Large periarticular erosion involving the lateral head of the fifth metatarsal as can be seen with crystalline arthropathy such as gout. Electronically Signed: By: Kathreen Devoid On: 05/05/2016 19:58    Time Spent in minutes  30   Lala Lund K M.D on 05/07/2016 at 9:08 AM  Between 7am to 7pm - Pager - [phone]  After 7pm go to [email or web address] - password [facility]  [facility] -  Office  [phone]

## 2016-05-07 NOTE — Progress Notes (Signed)
Notified by primary team blood culture final read is not MRSA but coag neg staph. TTE benign yesterday. We will cancel previously requested TEE   Zandra Abts MD

## 2016-05-08 LAB — AEROBIC CULTURE  (SUPERFICIAL SPECIMEN)

## 2016-05-08 LAB — GLUCOSE, CAPILLARY
GLUCOSE-CAPILLARY: 127 mg/dL — AB (ref 65–99)
Glucose-Capillary: 210 mg/dL — ABNORMAL HIGH (ref 65–99)

## 2016-05-08 LAB — AEROBIC CULTURE W GRAM STAIN (SUPERFICIAL SPECIMEN)

## 2016-05-08 LAB — CULTURE, BLOOD (ROUTINE X 2)

## 2016-05-08 LAB — VANCOMYCIN, TROUGH: Vancomycin Tr: 14 ug/mL — ABNORMAL LOW (ref 15–20)

## 2016-05-08 MED ORDER — CEPHALEXIN 500 MG PO CAPS: 1000.0000 mg | ORAL_CAPSULE | Freq: Three times a day (TID) | ORAL | 0 refills | Status: DC

## 2016-05-08 NOTE — Progress Notes (Signed)
SURGICAL PROGRESS NOTE (cpt [phone])  Hospital Day(s): 2.   Post op day(s):  Marland Kitchen   Interval History: Patient seen and examined, no acute events or new complaints overnight. Patient reports much improved pain and decreased erythema with dressing changes being performed as ordered, denies any fever/chills, CP, or SOB.  Review of Systems:  Constitutional: denies fever, chills  HEENT: denies cough or congestion  Respiratory: denies any shortness of breath  Cardiovascular: denies chest pain or palpitations  Gastrointestinal: denies abdominal pain, N/V, or diarrhea Genitourinary: denies burning with urination or urinary frequency Musculoskeletal: denies pain, decreased motor or sensation Integumentary: denies any other rashes or skin discolorations except as per HPI Neurological: denies HA or vision/hearing changes   Vital signs in last 24 hours: [min-max] current  Temp:  [98.1 F (36.7 C)-99.3 F (37.4 C)] 98.4 F (36.9 C) (12/16 1112) Pulse Rate:  [69-82] 70 (12/16 1112) Resp:  [16-20] 16 (12/16 1112) BP: (129-157)/(40-56) 150/56 (12/16 1112) SpO2:  [96 %-100 %] 96 % (12/16 1112)     Height: 5\' 2"  (157.5 cm) Weight: 72 kg (158 lb 11.7 oz) BMI (Calculated): 29.1   Intake/Output this shift:  No intake/output data recorded.   Intake/Output last 2 shifts:  @IOLAST2SHIFTS @   Physical Exam:  Constitutional: alert, cooperative and no distress  HENT: normocephalic without obvious abnormality  Eyes: PERRL, EOM's grossly intact and symmetric  Neuro: CN II - XII grossly intact and symmetric without deficit  Respiratory: breathing non-labored at rest  Cardiovascular: regular rate and sinus rhythm  Gastrointestinal: soft, non-tender, and non-distended  Musculoskeletal: UE and LE FROM, ulcerated, tender, and erythematous Right 1st toe at MTP joint with caseous (white cheesey) wound exudate; area of erythema significantly smaller than as previously marked by wound care team; motor and  sensation grossly intact, NT  Pulse/Doppler Exam: (p=palpable; d=doppler signals; 0=none)                           Right               Left              Fem     p                      p              DP       p             weakly palpable   Labs:  CBC:  Lab Results  Component Value Date   WBC 9.3 05/07/2016   RBC 3.27 (L) 05/07/2016   HEMOGLOBIN 11.4 07/09/2015   BMP:  Lab Results  Component Value Date   GLUCOSE 183 (H) 05/07/2016   CO2 20 (L) 05/07/2016   BUN 25 (H) 05/07/2016   BUN 29 (H) 03/11/2016   CREATININE 1.58 (H) 05/07/2016   CREATININE 1.70 (H) 09/11/2012   CALCIUM 8.2 (L) 05/07/2016   CALCIUM 7.0 (L) 10/01/2014     Imaging studies:  B/L ABI (05/07/2016) - personally reviewed: Right - 0.87, Left - 0.90  Assessment/Plan: (ICD-10's: M10.071) 70 y.o.femalewith improving 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  - no surgical intervention indicated at this time - okay to continue with daily wound  care as prescribed/ordered - baseline bilateral ABI's personally reviewed and discussed with patient - renally dosed Cochicine/NSAIDs and renal-protective uric acid reduction (febuxostat/uricase) per medical team             - consider dietary education to help reduce risk of future acute gout exacerbations - antibiotics and medical management of comorbidities as per medical team - frequent repositioning to reduce risk of pressure sore formation - continue to monitor for improvement of gout and wound - twice daily routine foot checks regardless of gout - routine outpatient follow-up with podiatrist             - will sign  off, please call if questions - DVT prophylaxis  All of the above findings and recommendations were discussed with the patient and her family, and all of patient's and her family's questions were answered to their expressed satisfaction.  Thank you for the opportunity to participate in this patient's care.  -- Marilynne Drivers Rosana Hoes, MD, [place]: [facility] Office #: [phone]

## 2016-05-08 NOTE — Progress Notes (Signed)
Patient expressed concerns with "care over the past couple days" this am. Requested to speak with Nursing Administration. Nursing Supervisor notified per night shift RN early this morning per report. Nursing supervisor notified by day shift charge nurse as well. Pt seen by Belva Chimes, Nursing Supervisor this afternoon. Pt stated she felt better after speaking with her. Donavan Foil, RN

## 2016-05-08 NOTE — Progress Notes (Signed)
Patient left floor in stable condition with husband via w/c accompanied by nurse tech. Donavan Foil, RN

## 2016-05-08 NOTE — Discharge Summary (Signed)
[name] KYH:[id number] DOB: Nov 02, 1945 DOA: 05/05/2016  PCP: Eustaquio Maize, MD  Admit date: 05/05/2016  Discharge date: 05/08/2016  Admitted From: Home   Disposition:  Home   Recommendations for Outpatient Follow-up:   Follow up with PCP in 1-2 weeks  PCP Please obtain BMP/CBC, 2 view CXR in 1week,  (see Discharge instructions)   PCP Please follow up on the following pending results: None   Home Health: [facility]   Equipment/Devices: None  Consultations:  Surgey, ID Dr. Linus Salmons over the phone on 05/07/2016 Discharge Condition: Fair   CODE STATUS: Full   Diet Recommendation: Diet heart healthy/carb modified    Chief Complaint  Patient presents with  . Foot Pain     Brief history of present illness from the day of admission and additional interim summary    [name] a 70 y.o.female,With history of gout, diabetes mellitus who came to the hospital with worsening redness and swelling of the MTPjoint of right big toe for past few days. Patient was seen in the ED yesterday and diagnosed with cellulitis, along with gouty arthritis. Patient was sent home after drawing blood cultures. Patient was called back to the ED, as blood cultures are growing gram-positive cocci in 2 out of 2 bottles.  Hospital issues addressed      1.Right toe ulcer with surrounding cellulitis. Discussed with lab on 05/07/2016 blood cultures from 05/04/2016 . 2. Growing strep and coag-negative staph, continue vancomycin and Rocephin and final culture sensitivity data is back, discussed with ID physician Dr. Linus Salmons as well on 05/07/2016, Per ID Dr. Linus Salmons who reviewed the chart and culture results it was safe to transition to oral Keflex outpatient follow-up.  Gen. surgery Saw the patient, no surgical need, MRI of the foot does  not show osteomyelitis, will be placed on 10 more days of oral Keflex with outpatient follow-up with PCP and ID. She will also go to her wound care clinic at [facility] with Dr. Alford Highland soon. I have also ordered home health RN for wound care.  2. Dyslipidemia. On statin continue.  3. Hypothyroidism. Continue Synthroid.  4. Hypertension. Continue beta blocker and Norvasc at home dose.  5. DM type II. Continue home regimen unchanged, follow with PCP.  Lab Results  Component Value Date   HGBA1C 6.6 (H) 05/05/2016   CBG (last 3)   Recent Labs  05/07/16 1610 05/07/16 2146 05/08/16 0737  GLUCAP 252* 196* 127*        Discharge diagnosis     Active Problems:   Type 2 diabetes mellitus with stage 4 chronic kidney disease (HCC)   Hypothyroidism   CKD (chronic kidney disease), stage III   Cellulitis   Diabetic foot infection [facility])    Discharge instructions    Discharge Instructions    Discharge instructions    Complete by:  As directed    Follow with Primary MD Eustaquio Maize, MD in 7 days   Get CBC, CMP, 2 view Chest X ray checked  by Primary MD  or SNF MD in 5-7 days ( we routinely change or add medications that can affect your baseline labs and fluid status, therefore we recommend that you get the mentioned basic workup next visit with your PCP, your PCP may decide not to get them or add new tests based on their clinical decision)   Activity: As tolerated with Full fall precautions use walker/cane & assistance as needed   Disposition Home    Diet:   Diet heart healthy/carb modified    For Heart failure patients - Check your Weight same time everyday, if you gain over 2 pounds, or you develop in leg swelling, experience more shortness of breath or chest pain, call your Primary MD immediately. Follow Cardiac Low Salt Diet and 1.5 lit/day fluid restriction.   On your next visit with your primary care physician please Get Medicines reviewed and  adjusted.   Please request your Prim.MD to go over all Hospital Tests and Procedure/Radiological results at the follow up, please get all Hospital records sent to your Prim MD by signing hospital release before you go home.   If you experience worsening of your admission symptoms, develop shortness of breath, life threatening emergency, suicidal or homicidal thoughts you must seek medical attention immediately by calling 911 or calling your MD immediately  if symptoms less severe.  You Must read complete instructions/literature along with all the possible adverse reactions/side effects for all the Medicines you take and that have been prescribed to you. Take any new Medicines after you have completely understood and accpet all the possible adverse reactions/side effects.   Do not drive, operate heavy machinery, perform activities at heights, swimming or participation in water activities or provide baby sitting services if your were admitted for syncope or siezures until you have seen by Primary MD or a Neurologist and advised to do so again.  Do not drive when taking Pain medications.    Do not take more than prescribed Pain, Sleep and Anxiety Medications  Special Instructions: If you have smoked or chewed Tobacco  in the last 2 yrs please stop smoking, stop any regular Alcohol  and or any Recreational drug use.  Wear Seat belts while driving.   Please note  You were cared for by a hospitalist during your hospital stay. If you have any questions about your discharge medications or the care you received while you were in the hospital after you are discharged, you can call the unit and asked to speak with the hospitalist on call if the hospitalist that took care of you is not available. Once you are discharged, your primary care physician will handle any further medical issues. Please note that NO REFILLS for any discharge medications will be authorized once you are discharged, as it is  imperative that you return to your primary care physician (or establish a relationship with a primary care physician if you do not have one) for your aftercare needs so that they can reassess your need for medications and monitor your lab values.   Increase activity slowly    Complete by:  As directed       Discharge Medications   Allergies as of 05/08/2016      Reactions   Aspirin Nausea And Vomiting   Ciprofloxacin Other (See Comments)   Upset Stomach   Codeine Nausea And Vomiting   Makes me sick    Micardis [telmisartan] Other (See Comments)   unknown   Nexium [esomeprazole Magnesium] Other (See Comments)   Causes internal  bleeding   Niaspan [niacin Er] Other (See Comments)   Reaction is unknown   Onglyza [saxagliptin] Other (See Comments)   Reaction is unknown   Other    No otc pain medications   Rofecoxib Other (See Comments)   unknown   Simvastatin    Tequin [gatifloxacin] Other (See Comments)   Reaction is unknown   Welchol [colesevelam Hcl]    Amoxicillin Rash      Medication List    TAKE these medications   ACCU-CHEK AVIVA PLUS w/Device Kit Use as directed 4 times daily   accu-chek soft touch lancets Use as instructed 4 x daily, E11.65   amLODipine 5 MG tablet Commonly known as:  NORVASC Take 5 mg by mouth daily.   amLODipine 5 MG tablet Commonly known as:  NORVASC Take 1 tablet (5 mg total) by mouth daily.   atorvastatin 40 MG tablet Commonly known as:  LIPITOR Take 1 tablet (40 mg total) by mouth every evening.   cephALEXin 500 MG capsule Commonly known as:  KEFLEX Take 2 capsules (1,000 mg total) by mouth 3 (three) times daily.   clindamycin 300 MG capsule Commonly known as:  CLEOCIN Take 1 capsule (300 mg total) by mouth 3 (three) times daily.   glucose blood test strip Commonly known as:  ACCU-CHEK AVIVA Use as instructed 4 x daily. E11.65   insulin aspart protamine - aspart (70-30) 100 UNIT/ML FlexPen Commonly known as:  NOVOLOG MIX  70/30 FLEXPEN Inject 0.08 mLs (8 Units total) into the skin daily with breakfast. Only if blood glucose is above 90   levothyroxine 75 MCG tablet Commonly known as:  SYNTHROID, LEVOTHROID Take 1 tablet (75 mcg total) by mouth daily before breakfast.   losartan 100 MG tablet Commonly known as:  COZAAR Take 1 tablet (100 mg total) by mouth daily.   metoprolol succinate 50 MG 24 hr tablet Commonly known as:  TOPROL-XL Take 1 tablet (50 mg total) by mouth every morning.   pantoprazole 20 MG tablet Commonly known as:  PROTONIX Take 1 tablet (20 mg total) by mouth daily.   UNIFINE PENTIPS 32G X 4 MM Misc Generic drug:  Insulin Pen Needle   Insulin Pen Needle 32G X 4 MM Misc Commonly known as:  UNIFINE PENTIPS Use as directed bid. E11.65       Follow-up Information    Eustaquio Maize, MD. Schedule an appointment as soon as possible for a visit in 1 week(s).   Specialty:  Pediatrics Why:  and your wound clinic doctor within a week Contact information: [place] [address] [phone]        Scharlene Gloss, MD. Schedule an appointment as soon as possible for a visit in 1 week(s).   Specialty:  Infectious Diseases Why:  Infectious disease doctor for foot infection Contact information: [address] [address] [phone]           Major procedures and Radiology Reports - PLEASE review detailed and final reports thoroughly  -     TTE  - Left ventricle: The cavity size was normal. Wall thickness was   increased in a pattern of mild LVH. Systolic function was normal.   The estimated ejection fraction was in the range of 60% to 65%.   Wall motion was normal; there were no regional wall motion   abnormalities. Features are consistent with a pseudonormal left   ventricular filling pattern, with concomitant abnormal relaxation   and increased  filling pressure (grade 2 diastolic dysfunction).   Doppler parameters are consistent with  high ventricular filling   pressure. - Aortic valve: Moderately calcified annulus. Trileaflet; mildly   thickened leaflets. There was no stenosis. - Left atrium: The atrium was moderately dilated.  Impressions:  - No vegetations seen.    Mr Foot Right Wo Contrast  Result Date: 05/06/2016 CLINICAL DATA:  Pain, redness of the entire right foot for 2 months EXAM: MRI OF THE RIGHT FOREFOOT WITHOUT CONTRAST TECHNIQUE: Multiplanar, multisequence MR imaging of the right forefoot was performed. No intravenous contrast was administered. COMPARISON:  None. FINDINGS: Bones/Joint/Cartilage No acute fracture or dislocation. Multiple erosions involving the base of the second, third and fourth metatarsals. Small erosions involving the medial cuneiform, middle cuneiform, lateral cuneiform, cuboid, medial distal navicular and distal lateral calcaneus. Large erosion involving the lateral aspect of the fifth metatarsal head and neck. Adjacent to the erosions there are T1 and T2 low signal nodular soft tissue abnormality. 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 In the plantar medial aspect of the great toe there is a 3.4 x 4.2 x 3.8 cm T1 hypointense and heterogeneously T2 hyperintense soft tissue abnormality at the level of the first MTP joint. No underlying bone destruction. Overall normal alignment. No significant joint effusion. Moderate osteoarthritis of the first MTP joint. Mild osteoarthritis of the first IP joint. Ligaments Collateral ligaments are intact. Muscles and Tendons Muscles are normal. Flexor, extensor and peroneal tendons are grossly intact. Soft tissues No other fluid collection or hematoma. IMPRESSION: 1. 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 2. In the plantar medial aspect of the  great toe there is a 3.4 x 4.2 x 3.8 cm T1 hypointense and heterogeneously T2 hyperintense soft tissue abnormality at the level of the first MTP joint. This abnormality is different in signal compared with the remainder of the midfoot and forefoot abnormalities. This may reflect tophaceous gout versus phlegmon with a small abscess. No focal marrow signal abnormality or bone destruction to suggest osteomyelitis. Electronically Signed   By: Kathreen Devoid   On: 05/06/2016 09:56   Dg Foot Complete Right  Addendum Date: 05/05/2016   ADDENDUM REPORT: 05/05/2016 20:01 ADDENDUM: Mentioned in the body, but not mentioned in the impression is severe osteolysis of the third middle and distal phalanx which may be secondary to infection or gout. Electronically Signed   By: Kathreen Devoid   On: 05/05/2016 20:01   Result Date: 05/05/2016 CLINICAL DATA:  Draining ulcer of the right great toe. EXAM: RIGHT FOOT COMPLETE - 3+ VIEW COMPARISON:  None. FINDINGS: No acute fracture or dislocation. Severe generalized osteopenia. Large periarticular erosion involving the lateral head of the fifth metatarsal as can be seen with crystalline arthropathy such as gout. Severe soft tissue swelling along the medial aspect of the first MTP joint. No definite erosive changes or bone destruction. Moderate osteoarthritis of the first MTP joint. Mild osteoarthritis of the first IP joint. 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 Severe degenerative changes of the tarsometatarsal joints throughout the right foot. Plantar calcaneal spur with calcification of the plantar fascia. Enthesopathic changes of the Achilles tendon insertion. Peripheral vascular atherosclerotic disease. IMPRESSION: 1. Severe soft tissue swelling along the medial aspect of the first MTP joint concerning for severe cellulitis. No definite erosive changes or bone  destruction to suggest osteomyelitis. If there is further clinical concern, recommend MRI of the right  foot. 2. Large periarticular erosion involving the lateral head of the fifth metatarsal as can be seen with crystalline arthropathy such as gout. Electronically Signed: By: Kathreen Devoid On: 05/05/2016 19:58    Micro Results     Recent Results (from the past 240 hour(s))  Aerobic Culture (superficial specimen)     Status: None (Preliminary result)   Collection Time: 05/04/16  6:32 PM  Result Value Ref Range Status   Specimen Description FOOT RIGHT  Final   Special Requests NONE  Final   Gram Stain   Final    ABUNDANT WBC PRESENT,BOTH PMN AND MONONUCLEAR FEW GRAM POSITIVE COCCI IN PAIRS Performed at [facility]  Final   Report Status PENDING  Incomplete   Organism ID, Bacteria STAPHYLOCOCCUS AUREUS  Final      Susceptibility   Staphylococcus aureus - MIC*    CIPROFLOXACIN <=0.5 SENSITIVE Sensitive     ERYTHROMYCIN 0.5 SENSITIVE Sensitive     GENTAMICIN <=0.5 SENSITIVE Sensitive     OXACILLIN 0.5 SENSITIVE Sensitive     TETRACYCLINE <=1 SENSITIVE Sensitive     VANCOMYCIN <=0.5 SENSITIVE Sensitive     TRIMETH/SULFA <=10 SENSITIVE Sensitive     CLINDAMYCIN <=0.25 SENSITIVE Sensitive     RIFAMPIN <=0.5 SENSITIVE Sensitive     Inducible Clindamycin NEGATIVE Sensitive     * ABUNDANT STAPHYLOCOCCUS AUREUS  Blood culture (routine x 2)     Status: Abnormal   Collection Time: 05/04/16  6:57 PM  Result Value Ref Range Status   Specimen Description BLOOD RIGHT HAND  Final   Special Requests BOTTLES DRAWN AEROBIC AND ANAEROBIC [facility] EACH  Final   Culture  Setup Time   Final    GRAM POSITIVE COCCI ANAEROBIC BOTTLE ONLY Performed at [facility] Gram Stain Report Called to,Read Back By and Verified With: CARDWELL,L AT 1600 ON 05/05/2016 BY ISLEY,B    Culture (A)  Final    STAPHYLOCOCCUS SPECIES (COAGULASE NEGATIVE) SUSCEPTIBILITIES PERFORMED ON PREVIOUS CULTURE WITHIN THE LAST 5 DAYS. Performed at [facility]    Report  Status 05/08/2016 FINAL  Final  Blood culture (routine x 2)     Status: Abnormal (Preliminary result)   Collection Time: 05/04/16  6:57 PM  Result Value Ref Range Status   Specimen Description RIGHT ANTECUBITAL  Final   Special Requests BOTTLES DRAWN AEROBIC AND ANAEROBIC [place]  Final   Culture  Setup Time   Final    GRAM POSITIVE COCCI IN BOTH AEROBIC AND ANAEROBIC BOTTLES Performed at [facility] Gram Stain Report Called to,Read Back By and Verified With: CARDWELL AT 1600 ON 05/05/2016 BY ISLEY,B CRITICAL RESULT CALLED TO, READ BACK BY AND VERIFIED WITHHinton Dyer RN [id number] 05/06/16 A BROWNING    Culture (A)  Final    STAPHYLOCOCCUS SPECIES (COAGULASE NEGATIVE) GRAM POSITIVE COCCI IDENTIFICATION TO FOLLOW Performed at [facility]    Report Status PENDING  Incomplete   Organism ID, Bacteria STAPHYLOCOCCUS SPECIES (COAGULASE NEGATIVE)  Final      Susceptibility   Staphylococcus species (coagulase negative) - MIC*    CIPROFLOXACIN <=0.5 SENSITIVE Sensitive     ERYTHROMYCIN <=0.25 SENSITIVE Sensitive     GENTAMICIN <=0.5 SENSITIVE Sensitive     OXACILLIN <=0.25 SENSITIVE Sensitive     TETRACYCLINE <=1 SENSITIVE Sensitive     VANCOMYCIN 1 SENSITIVE Sensitive  TRIMETH/SULFA <=10 SENSITIVE Sensitive     CLINDAMYCIN <=0.25 SENSITIVE Sensitive     RIFAMPIN <=0.5 SENSITIVE Sensitive     Inducible Clindamycin NEGATIVE Sensitive     * STAPHYLOCOCCUS SPECIES (COAGULASE NEGATIVE)  Blood Culture ID Panel (Reflexed)     Status: Abnormal   Collection Time: 05/04/16  6:57 PM  Result Value Ref Range Status   Enterococcus species NOT DETECTED NOT DETECTED Final   Listeria monocytogenes NOT DETECTED NOT DETECTED Final   Staphylococcus species DETECTED (A) NOT DETECTED Final    Comment: CRITICAL RESULT CALLED TO, READ BACK BY AND VERIFIED WITHHinton Dyer RN 0101 05/06/16 A BROWNING    Staphylococcus aureus NOT DETECTED NOT DETECTED Final   Methicillin resistance DETECTED  (A) NOT DETECTED Final    Comment: CRITICAL RESULT CALLED TO, READ BACK BY AND VERIFIED WITHHinton Dyer RN [phone] 05/06/16 A BROWNING    Streptococcus species DETECTED (A) NOT DETECTED Final    Comment: CRITICAL RESULT CALLED TO, READ BACK BY AND VERIFIED WITHHinton Dyer RN [phone] 05/06/16 A BROWNING    Streptococcus agalactiae NOT DETECTED NOT DETECTED Final   Streptococcus pneumoniae NOT DETECTED NOT DETECTED Final   Streptococcus pyogenes NOT DETECTED NOT DETECTED Final   Acinetobacter baumannii NOT DETECTED NOT DETECTED Final   Enterobacteriaceae species NOT DETECTED NOT DETECTED Final   Enterobacter cloacae complex NOT DETECTED NOT DETECTED Final   Escherichia coli NOT DETECTED NOT DETECTED Final   Klebsiella oxytoca NOT DETECTED NOT DETECTED Final   Klebsiella pneumoniae NOT DETECTED NOT DETECTED Final   Proteus species NOT DETECTED NOT DETECTED Final   Serratia marcescens NOT DETECTED NOT DETECTED Final   Haemophilus influenzae NOT DETECTED NOT DETECTED Final   Neisseria meningitidis NOT DETECTED NOT DETECTED Final   Pseudomonas aeruginosa NOT DETECTED NOT DETECTED Final   Candida albicans NOT DETECTED NOT DETECTED Final   Candida glabrata NOT DETECTED NOT DETECTED Final   Candida krusei NOT DETECTED NOT DETECTED Final   Candida parapsilosis NOT DETECTED NOT DETECTED Final   Candida tropicalis NOT DETECTED NOT DETECTED Final    Comment: Performed at [facility]  Blood culture (routine x 2)     Status: None (Preliminary result)   Collection Time: 05/05/16  7:34 PM  Result Value Ref Range Status   Specimen Description BLOOD RIGHT HAND  Final   Special Requests BOTTLES DRAWN AEROBIC AND ANAEROBIC [place]  Final   Culture NO GROWTH 2 DAYS  Final   Report Status PENDING  Incomplete  Wound or Superficial Culture     Status: None (Preliminary result)   Collection Time: 05/05/16  7:40 PM  Result Value Ref Range Status   Specimen Description FOOT WOUND  Final   Special  Requests NONE  Final   Gram Stain   Final    RARE WBC PRESENT, PREDOMINANTLY PMN MODERATE GRAM POSITIVE COCCI IN PAIRS AND CHAINS Performed at [place]  Final   Report Status PENDING  Incomplete  Blood culture (routine x 2)     Status: None (Preliminary result)   Collection Time: 05/05/16  8:08 PM  Result Value Ref Range Status   Specimen Description LEFT ANTECUBITAL  Final   Special Requests BOTTLES DRAWN AEROBIC AND ANAEROBIC [place]  Final   Culture NO GROWTH 2 DAYS  Final   Report Status PENDING  Incomplete    Today   Subjective    [name] today has no headache,no chest  abdominal pain,no new weakness tingling or numbness, feels much better wants to go home today.     Objective   Blood pressure (!) 129/40, pulse 69, temperature 98.1 F (36.7 C), temperature source Oral, resp. rate 18, height _0  (1.575 m), weight 72 kg (158 lb 11.7 oz), SpO2 100 %.   Intake/Output Summary (Last 24 hours) at 05/08/16 0925 Last data filed at 05/08/16 0641  Gross per 24 hour  Intake          2971.25 ml  Output                0 ml  Net          2971.25 ml    Exam Awake Alert, Oriented x 3, No new F.N deficits, Normal affect [place].AT,PERRAL Supple Neck,No JVD, No cervical lymphadenopathy appriciated.  Symmetrical Chest wall movement, Good air movement bilaterally, CTAB RRR,No Gallops,Rubs or new Murmurs, No Parasternal Heave +ve B.Sounds, Abd Soft, Non tender, No organomegaly appriciated, No rebound -guarding or rigidity. No Cyanosis, Clubbing or edema, No new Rash or bruise, right foot under the bandage at this time no surrounding cellulitis   Data Review   CBC w Diff:  Lab Results  Component Value Date   WBC 9.3 05/07/2016   HGB 9.9 (L) 05/07/2016   HCT 29.9 (L) 05/07/2016   HCT 35.9 07/09/2015   PLT 192 05/07/2016   PLT 257 07/09/2015   LYMPHOPCT 15 05/04/2016   MONOPCT 9 05/04/2016   EOSPCT 1 05/04/2016   BASOPCT  0 05/04/2016    CMP:  Lab Results  Component Value Date   NA 138 05/07/2016   NA 140 03/11/2016   K 5.1 05/07/2016   CL 111 05/07/2016   CO2 20 (L) 05/07/2016   BUN 25 (H) 05/07/2016   BUN 29 (H) 03/11/2016   CREATININE 1.58 (H) 05/07/2016   CREATININE 1.70 (H) 09/11/2012   PROT 6.5 05/06/2016   PROT 6.8 11/21/2015   ALBUMIN 2.9 (L) 05/06/2016   ALBUMIN 3.9 11/21/2015   BILITOT 0.5 05/06/2016   BILITOT 0.3 11/21/2015   ALKPHOS 82 05/06/2016   AST 12 (L) 05/06/2016   ALT 7 (L) 05/06/2016  .   Total Time in preparing paper work, data evaluation and todays exam - 35 minutes  Thurnell Lose M.D on 05/08/2016 at 9:25 AM  [facility]   Office  [phone]

## 2016-05-08 NOTE — Progress Notes (Addendum)
CM contacted [facility] and spoke with Butch Penny. She will receive patient's information through EPIC.  CM did not fax paperwork to [facility].   Informed patient is discharging today for home and wants to use services for wound care for foot.  Patient is to have prescriptions called into the [facility] located in [place], [place].  CM informed the patient they will contact her either Sunday or Monday.  Provided phone number to patient for [facility] in [place] and patient to receive prescriptions to take to a pharmacy of choice to fill.

## 2016-05-08 NOTE — Progress Notes (Signed)
Vanc level 14 this am. Notified Lori in pharmacy. Stated okay to continue with scheduled dose. Donavan Foil, RN

## 2016-05-08 NOTE — Discharge Instructions (Signed)
Follow with Primary MD Eustaquio Maize, MD in 7 days   Get CBC, CMP, 2 view Chest X ray checked  by Primary MD or SNF MD in 5-7 days ( we routinely change or add medications that can affect your baseline labs and fluid status, therefore we recommend that you get the mentioned basic workup next visit with your PCP, your PCP may decide not to get them or add new tests based on their clinical decision)   Activity: As tolerated with Full fall precautions use walker/cane & assistance as needed   Disposition Home    Diet:   Diet heart healthy/carb modified    For Heart failure patients - Check your Weight same time everyday, if you gain over 2 pounds, or you develop in leg swelling, experience more shortness of breath or chest pain, call your Primary MD immediately. Follow Cardiac Low Salt Diet and 1.5 lit/day fluid restriction.   On your next visit with your primary care physician please Get Medicines reviewed and adjusted.   Please request your Prim.MD to go over all Hospital Tests and Procedure/Radiological results at the follow up, please get all Hospital records sent to your Prim MD by signing hospital release before you go home.   If you experience worsening of your admission symptoms, develop shortness of breath, life threatening emergency, suicidal or homicidal thoughts you must seek medical attention immediately by calling 911 or calling your MD immediately  if symptoms less severe.  You Must read complete instructions/literature along with all the possible adverse reactions/side effects for all the Medicines you take and that have been prescribed to you. Take any new Medicines after you have completely understood and accpet all the possible adverse reactions/side effects.   Do not drive, operate heavy machinery, perform activities at heights, swimming or participation in water activities or provide baby sitting services if your were admitted for syncope or siezures until you have seen  by Primary MD or a Neurologist and advised to do so again.  Do not drive when taking Pain medications.    Do not take more than prescribed Pain, Sleep and Anxiety Medications  Special Instructions: If you have smoked or chewed Tobacco  in the last 2 yrs please stop smoking, stop any regular Alcohol  and or any Recreational drug use.  Wear Seat belts while driving.   Please note  You were cared for by a hospitalist during your hospital stay. If you have any questions about your discharge medications or the care you received while you were in the hospital after you are discharged, you can call the unit and asked to speak with the hospitalist on call if the hospitalist that took care of you is not available. Once you are discharged, your primary care physician will handle any further medical issues. Please note that NO REFILLS for any discharge medications will be authorized once you are discharged, as it is imperative that you return to your primary care physician (or establish a relationship with a primary care physician if you do not have one) for your aftercare needs so that they can reassess your need for medications and monitor your lab values.

## 2016-05-08 NOTE — Progress Notes (Signed)
Discharge instructions reviewed with patient. Given copy of AVS and prescription. Pt aware she may get prescription filled at any pharmacy she prefers. Verbalized understanding of instructions and when to f/u with PCP and wound care. States she will also f/u with Dr. Linus Salmons as directed. IV site d/c'd and within normal limits. Received IV antiobiotics prior to discharge. Verbalized understanding of oral antibiotic at home. Dressing to changed this am. Dressing supplies at bedside sent home with patient. Home Health arranged per CM today. Pt aware. Pt in stable condition awaiting husband's arrival for discharge home. Donavan Foil, RN

## 2016-05-09 ENCOUNTER — Telehealth: Payer: Self-pay

## 2016-05-09 LAB — AEROBIC CULTURE  (SUPERFICIAL SPECIMEN)

## 2016-05-09 LAB — AEROBIC CULTURE W GRAM STAIN (SUPERFICIAL SPECIMEN)

## 2016-05-09 NOTE — Telephone Encounter (Signed)
Post ED Visit - Positive Culture Follow-up  Culture report reviewed by antimicrobial stewardship pharmacist:  []  Elenor Quinones, Pharm.D. []  Heide Guile, Pharm.D., BCPS []  Parks Neptune, Pharm.D. []  Alycia Rossetti, Pharm.D., BCPS []  [place], Pharm.D., BCPS, AAHIVP []  Legrand Como, Pharm.D., BCPS, AAHIVP []  Milus Glazier, Pharm.D. []  Stephens November, Pharm.D. Ebony Hail Masters Pharm D Positive Blood culture Treated with Clindamycin, organism sensitive to the same and no further patient follow-up is required at this time.  Genia Del 05/09/2016, 1:50 PM

## 2016-05-10 ENCOUNTER — Telehealth (HOSPITAL_BASED_OUTPATIENT_CLINIC_OR_DEPARTMENT_OTHER): Payer: Self-pay | Admitting: Emergency Medicine

## 2016-05-10 DIAGNOSIS — I130 Hypertensive heart and chronic kidney disease with heart failure and stage 1 through stage 4 chronic kidney disease, or unspecified chronic kidney disease: Secondary | ICD-10-CM | POA: Diagnosis not present

## 2016-05-10 DIAGNOSIS — E1143 Type 2 diabetes mellitus with diabetic autonomic (poly)neuropathy: Secondary | ICD-10-CM | POA: Diagnosis not present

## 2016-05-10 DIAGNOSIS — E1122 Type 2 diabetes mellitus with diabetic chronic kidney disease: Secondary | ICD-10-CM | POA: Diagnosis not present

## 2016-05-10 DIAGNOSIS — N184 Chronic kidney disease, stage 4 (severe): Secondary | ICD-10-CM | POA: Diagnosis not present

## 2016-05-10 DIAGNOSIS — E11649 Type 2 diabetes mellitus with hypoglycemia without coma: Secondary | ICD-10-CM | POA: Diagnosis not present

## 2016-05-10 DIAGNOSIS — K604 Rectal fistula: Secondary | ICD-10-CM | POA: Diagnosis not present

## 2016-05-10 LAB — CULTURE, BLOOD (ROUTINE X 2)
CULTURE: NO GROWTH
Culture: NO GROWTH

## 2016-05-10 NOTE — Telephone Encounter (Signed)
Post ED Visit - Positive Culture Follow-up  Culture report reviewed by antimicrobial stewardship pharmacist:  [x]  Elenor Quinones, Pharm.D. []  Heide Guile, Pharm.D., BCPS []  Parks Neptune, Pharm.D. []  Alycia Rossetti, Pharm.D., BCPS []  [place], Pharm.D., BCPS, AAHIVP []  Legrand Como, Pharm.D., BCPS, AAHIVP []  Milus Glazier, Pharm.D. []  Rob Evette Doffing, Pharm.D.  Positive wound culture Treated with clindamycin, ceftriaxone and keflex, organism sensitive to the same and no further patient follow-up is required at this time.  Hazle Nordmann 05/10/2016, 10:25 AM

## 2016-05-11 ENCOUNTER — Telehealth: Payer: Self-pay | Admitting: *Deleted

## 2016-05-11 NOTE — Telephone Encounter (Signed)
Call Completed and Appointment Scheduled: Call completed but patient prefers to call back and schedule her appointment for next week. She is scheduled to see the wound center on 05/13/16. Asked her to schedule an appt for early next week.   DISCHARGE INFORMATION Date of Discharge:05/08/16  Discharge Facility: [facility]  Principal Discharge Diagnosis: cellulitis  Patient and/or caregiver is knowledgeable of his/her condition(s) and treatment: Yes  MEDICATION RECONCILIATION Current medication list reviewed with patient:Yes  Outpatient Encounter Prescriptions as of 05/11/2016  Medication Sig  . amLODipine (NORVASC) 5 MG tablet Take 1 tablet (5 mg total) by mouth daily.  . amLODipine (NORVASC) 5 MG tablet Take 5 mg by mouth daily.  . atorvastatin (LIPITOR) 40 MG tablet Take 1 tablet (40 mg total) by mouth every evening.  . Blood Glucose Monitoring Suppl (ACCU-CHEK AVIVA PLUS) w/Device KIT Use as directed 4 times daily  . cephALEXin (KEFLEX) 500 MG capsule Take 2 capsules (1,000 mg total) by mouth 3 (three) times daily.  . clindamycin (CLEOCIN) 300 MG capsule Take 1 capsule (300 mg total) by mouth 3 (three) times daily.  . glucose blood (ACCU-CHEK AVIVA) test strip Use as instructed 4 x daily. E11.65  . insulin aspart protamine - aspart (NOVOLOG MIX 70/30 FLEXPEN) (70-30) 100 UNIT/ML FlexPen Inject 0.08 mLs (8 Units total) into the skin daily with breakfast. Only if blood glucose is above 90  . Insulin Pen Needle (UNIFINE PENTIPS) 32G X 4 MM MISC Use as directed bid. E11.65  . Lancets (ACCU-CHEK SOFT TOUCH) lancets Use as instructed 4 x daily, E11.65  . levothyroxine (SYNTHROID, LEVOTHROID) 75 MCG tablet Take 1 tablet (75 mcg total) by mouth daily before breakfast.  . losartan (COZAAR) 100 MG tablet Take 1 tablet (100 mg total) by mouth daily. (Patient not taking: Reported on 05/04/2016)  . metoprolol succinate (TOPROL-XL) 50 MG 24 hr tablet Take 1 tablet (50 mg total) by mouth every  morning.  . pantoprazole (PROTONIX) 20 MG tablet Take 1 tablet (20 mg total) by mouth daily. (Patient not taking: Reported on 05/04/2016)  . UNIFINE PENTIPS 32G X 4 MM MISC    No facility-administered encounter medications on file as of 05/11/2016.     Discharge Medications reviewed and reconciled with current medications.yes  Patient is able to obtain needed medications:Yes  ACTIVITIES OF DAILY LIVING  Is the patient able to perform his/her own ADLs: Yes.    Patient is receiving home health services: No.  PATIENT EDUCATION Questions/Concerns Discussed: No questions or concerns at this time.          

## 2016-05-12 DIAGNOSIS — E1143 Type 2 diabetes mellitus with diabetic autonomic (poly)neuropathy: Secondary | ICD-10-CM | POA: Diagnosis not present

## 2016-05-12 DIAGNOSIS — E11649 Type 2 diabetes mellitus with hypoglycemia without coma: Secondary | ICD-10-CM | POA: Diagnosis not present

## 2016-05-12 DIAGNOSIS — K604 Rectal fistula: Secondary | ICD-10-CM | POA: Diagnosis not present

## 2016-05-12 DIAGNOSIS — E1122 Type 2 diabetes mellitus with diabetic chronic kidney disease: Secondary | ICD-10-CM | POA: Diagnosis not present

## 2016-05-12 DIAGNOSIS — N184 Chronic kidney disease, stage 4 (severe): Secondary | ICD-10-CM | POA: Diagnosis not present

## 2016-05-12 DIAGNOSIS — I130 Hypertensive heart and chronic kidney disease with heart failure and stage 1 through stage 4 chronic kidney disease, or unspecified chronic kidney disease: Secondary | ICD-10-CM | POA: Diagnosis not present

## 2016-05-13 ENCOUNTER — Encounter (HOSPITAL_BASED_OUTPATIENT_CLINIC_OR_DEPARTMENT_OTHER): Payer: Medicare Other | Attending: Internal Medicine

## 2016-05-13 DIAGNOSIS — S91301A Unspecified open wound, right foot, initial encounter: Secondary | ICD-10-CM | POA: Diagnosis not present

## 2016-05-13 DIAGNOSIS — E1121 Type 2 diabetes mellitus with diabetic nephropathy: Secondary | ICD-10-CM | POA: Insufficient documentation

## 2016-05-13 DIAGNOSIS — T8189XA Other complications of procedures, not elsewhere classified, initial encounter: Secondary | ICD-10-CM | POA: Diagnosis not present

## 2016-05-13 DIAGNOSIS — S91101A Unspecified open wound of right great toe without damage to nail, initial encounter: Secondary | ICD-10-CM | POA: Diagnosis not present

## 2016-05-13 DIAGNOSIS — L89152 Pressure ulcer of sacral region, stage 2: Secondary | ICD-10-CM | POA: Insufficient documentation

## 2016-05-13 DIAGNOSIS — M10071 Idiopathic gout, right ankle and foot: Secondary | ICD-10-CM | POA: Insufficient documentation

## 2016-05-13 DIAGNOSIS — S31000A Unspecified open wound of lower back and pelvis without penetration into retroperitoneum, initial encounter: Secondary | ICD-10-CM | POA: Diagnosis not present

## 2016-05-13 DIAGNOSIS — L97514 Non-pressure chronic ulcer of other part of right foot with necrosis of bone: Secondary | ICD-10-CM | POA: Diagnosis not present

## 2016-05-13 DIAGNOSIS — L089 Local infection of the skin and subcutaneous tissue, unspecified: Secondary | ICD-10-CM | POA: Insufficient documentation

## 2016-05-13 DIAGNOSIS — L97421 Non-pressure chronic ulcer of left heel and midfoot limited to breakdown of skin: Secondary | ICD-10-CM | POA: Diagnosis not present

## 2016-05-13 DIAGNOSIS — A4901 Methicillin susceptible Staphylococcus aureus infection, unspecified site: Secondary | ICD-10-CM | POA: Insufficient documentation

## 2016-05-13 DIAGNOSIS — A409 Streptococcal sepsis, unspecified: Secondary | ICD-10-CM | POA: Diagnosis not present

## 2016-05-14 DIAGNOSIS — E1122 Type 2 diabetes mellitus with diabetic chronic kidney disease: Secondary | ICD-10-CM | POA: Diagnosis not present

## 2016-05-14 DIAGNOSIS — N184 Chronic kidney disease, stage 4 (severe): Secondary | ICD-10-CM | POA: Diagnosis not present

## 2016-05-14 DIAGNOSIS — E1143 Type 2 diabetes mellitus with diabetic autonomic (poly)neuropathy: Secondary | ICD-10-CM | POA: Diagnosis not present

## 2016-05-14 DIAGNOSIS — E11649 Type 2 diabetes mellitus with hypoglycemia without coma: Secondary | ICD-10-CM | POA: Diagnosis not present

## 2016-05-14 DIAGNOSIS — K604 Rectal fistula: Secondary | ICD-10-CM | POA: Diagnosis not present

## 2016-05-14 DIAGNOSIS — I130 Hypertensive heart and chronic kidney disease with heart failure and stage 1 through stage 4 chronic kidney disease, or unspecified chronic kidney disease: Secondary | ICD-10-CM | POA: Diagnosis not present

## 2016-05-17 DIAGNOSIS — N184 Chronic kidney disease, stage 4 (severe): Secondary | ICD-10-CM | POA: Diagnosis not present

## 2016-05-17 DIAGNOSIS — E11649 Type 2 diabetes mellitus with hypoglycemia without coma: Secondary | ICD-10-CM | POA: Diagnosis not present

## 2016-05-17 DIAGNOSIS — K604 Rectal fistula: Secondary | ICD-10-CM | POA: Diagnosis not present

## 2016-05-17 DIAGNOSIS — I130 Hypertensive heart and chronic kidney disease with heart failure and stage 1 through stage 4 chronic kidney disease, or unspecified chronic kidney disease: Secondary | ICD-10-CM | POA: Diagnosis not present

## 2016-05-17 DIAGNOSIS — E1122 Type 2 diabetes mellitus with diabetic chronic kidney disease: Secondary | ICD-10-CM | POA: Diagnosis not present

## 2016-05-17 DIAGNOSIS — E1143 Type 2 diabetes mellitus with diabetic autonomic (poly)neuropathy: Secondary | ICD-10-CM | POA: Diagnosis not present

## 2016-05-18 ENCOUNTER — Other Ambulatory Visit: Payer: Self-pay | Admitting: General Surgery

## 2016-05-18 ENCOUNTER — Ambulatory Visit (INDEPENDENT_AMBULATORY_CARE_PROVIDER_SITE_OTHER): Payer: Medicare Other | Admitting: Pediatrics

## 2016-05-18 VITALS — BP 165/59 | HR 66 | Temp 98.0°F | Ht 62.0 in | Wt 158.8 lb

## 2016-05-18 DIAGNOSIS — M1A3791 Chronic gout due to renal impairment, unspecified ankle and foot, with tophus (tophi): Secondary | ICD-10-CM | POA: Insufficient documentation

## 2016-05-18 DIAGNOSIS — L089 Local infection of the skin and subcutaneous tissue, unspecified: Secondary | ICD-10-CM | POA: Diagnosis not present

## 2016-05-18 DIAGNOSIS — I10 Essential (primary) hypertension: Secondary | ICD-10-CM

## 2016-05-18 DIAGNOSIS — E1169 Type 2 diabetes mellitus with other specified complication: Secondary | ICD-10-CM

## 2016-05-18 DIAGNOSIS — E11628 Type 2 diabetes mellitus with other skin complications: Secondary | ICD-10-CM

## 2016-05-18 DIAGNOSIS — N183 Chronic kidney disease, stage 3 unspecified: Secondary | ICD-10-CM

## 2016-05-18 NOTE — Progress Notes (Addendum)
  Subjective:   Patient ID: [name], female    DOB: May 01, 1946, 70 y.o.   MRN: [id number] CC: Hospitalization Follow-up ([place] 12/13/1 cellulitis rt foot and lower leg)  HPI: [name] is a 70 y.o. female presenting for Hospitalization Follow-up ([place] 12/13/1 cellulitis rt foot and lower leg)  Recent admit for R foot toe ulcer with surrounding cellulitis MRI of foot without osteomyelitis Put on keflex at discharge, has several days left of antibiotics Feeling well overall No fevers Redness R foot much improved Swelling is down  Yesterday noticed new white blister L heel TTP Followed by wound at [facility] for wounds, tophus gout wounds R foot  Other than antibiotics, pt still taking same meds  Relevant past medical, surgical, family and social history reviewed. Allergies and medications reviewed and updated. History  Smoking Status  . Never Smoker  Smokeless Tobacco  . Never Used   ROS: Per HPI   Objective:    BP (!) 165/59   Pulse 66   Temp 98 F (36.7 C) (Oral)   Ht 5\' 2"  (1.575 m)   Wt 158 lb 12.8 oz (72 kg)   BMI 29.04 kg/m   Wt Readings from Last 3 Encounters:  05/18/16 158 lb 12.8 oz (72 kg)  05/05/16 158 lb 11.7 oz (72 kg)  05/04/16 159 lb (72.1 kg)    Gen: NAD, alert, cooperative with exam, NCAT EYES: EOMI, no conjunctival injection, or no icterus CV: NRRR, normal S1/S2 Resp: CTABL, no wheezes, normal WOB Abd: +BS, soft, NT Ext: No pitting edema LE Neuro: Alert and oriented Skin: R foot with original marks from wound care visible, redness much improved from those marks. Minimially tender. Still with some swelling 1st MTP joint. 2 small ulcers with white base dorsum R MTP. Clear fluid discharge from ulcers, minimal redness surrounding ulcers L heel with 1.5cm blister with white chalky substance below layer of epidermis, some redness, tenderness surrounding blister  Assessment & Plan:  [name] was seen today for follow-up multiple  med problems.  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

## 2016-05-19 ENCOUNTER — Telehealth: Payer: Self-pay | Admitting: Pediatrics

## 2016-05-19 ENCOUNTER — Other Ambulatory Visit: Payer: Self-pay | Admitting: *Deleted

## 2016-05-19 DIAGNOSIS — E1143 Type 2 diabetes mellitus with diabetic autonomic (poly)neuropathy: Secondary | ICD-10-CM | POA: Diagnosis not present

## 2016-05-19 DIAGNOSIS — M1A3791 Chronic gout due to renal impairment, unspecified ankle and foot, with tophus (tophi): Secondary | ICD-10-CM

## 2016-05-19 DIAGNOSIS — E1122 Type 2 diabetes mellitus with diabetic chronic kidney disease: Secondary | ICD-10-CM | POA: Diagnosis not present

## 2016-05-19 DIAGNOSIS — E11649 Type 2 diabetes mellitus with hypoglycemia without coma: Secondary | ICD-10-CM | POA: Diagnosis not present

## 2016-05-19 DIAGNOSIS — N184 Chronic kidney disease, stage 4 (severe): Secondary | ICD-10-CM | POA: Diagnosis not present

## 2016-05-19 DIAGNOSIS — K604 Rectal fistula: Secondary | ICD-10-CM | POA: Diagnosis not present

## 2016-05-19 DIAGNOSIS — I130 Hypertensive heart and chronic kidney disease with heart failure and stage 1 through stage 4 chronic kidney disease, or unspecified chronic kidney disease: Secondary | ICD-10-CM | POA: Diagnosis not present

## 2016-05-19 MED ORDER — COLCHICINE 0.6 MG PO TABS: 0.3000 mg | ORAL_TABLET | Freq: Every day | ORAL | 0 refills | Status: DC

## 2016-05-19 MED ORDER — ALLOPURINOL 100 MG PO TABS: 100.0000 mg | ORAL_TABLET | Freq: Every day | ORAL | 0 refills | Status: DC

## 2016-05-19 NOTE — Telephone Encounter (Signed)
[facility] [place] per patient request

## 2016-05-20 ENCOUNTER — Telehealth: Payer: Self-pay | Admitting: Pediatrics

## 2016-05-20 DIAGNOSIS — E1321 Other specified diabetes mellitus with diabetic nephropathy: Secondary | ICD-10-CM | POA: Diagnosis not present

## 2016-05-20 DIAGNOSIS — L89152 Pressure ulcer of sacral region, stage 2: Secondary | ICD-10-CM | POA: Diagnosis not present

## 2016-05-20 DIAGNOSIS — S91302A Unspecified open wound, left foot, initial encounter: Secondary | ICD-10-CM | POA: Diagnosis not present

## 2016-05-20 DIAGNOSIS — S91101A Unspecified open wound of right great toe without damage to nail, initial encounter: Secondary | ICD-10-CM | POA: Diagnosis not present

## 2016-05-20 DIAGNOSIS — S91301A Unspecified open wound, right foot, initial encounter: Secondary | ICD-10-CM | POA: Diagnosis not present

## 2016-05-20 DIAGNOSIS — E1121 Type 2 diabetes mellitus with diabetic nephropathy: Secondary | ICD-10-CM | POA: Diagnosis not present

## 2016-05-20 DIAGNOSIS — L97514 Non-pressure chronic ulcer of other part of right foot with necrosis of bone: Secondary | ICD-10-CM | POA: Diagnosis not present

## 2016-05-20 DIAGNOSIS — M10071 Idiopathic gout, right ankle and foot: Secondary | ICD-10-CM | POA: Diagnosis not present

## 2016-05-20 DIAGNOSIS — A409 Streptococcal sepsis, unspecified: Secondary | ICD-10-CM | POA: Diagnosis not present

## 2016-05-20 DIAGNOSIS — S31000A Unspecified open wound of lower back and pelvis without penetration into retroperitoneum, initial encounter: Secondary | ICD-10-CM | POA: Diagnosis not present

## 2016-05-20 DIAGNOSIS — S31809S Unspecified open wound of unspecified buttock, sequela: Secondary | ICD-10-CM | POA: Diagnosis not present

## 2016-05-20 DIAGNOSIS — L97421 Non-pressure chronic ulcer of left heel and midfoot limited to breakdown of skin: Secondary | ICD-10-CM | POA: Diagnosis not present

## 2016-05-20 DIAGNOSIS — L089 Local infection of the skin and subcutaneous tissue, unspecified: Secondary | ICD-10-CM | POA: Diagnosis not present

## 2016-05-20 NOTE — Telephone Encounter (Signed)
Please advise 

## 2016-05-20 NOTE — Telephone Encounter (Signed)
Pt notified of referral  

## 2016-05-20 NOTE — Addendum Note (Signed)
Addended by: Eustaquio Maize on: 05/20/2016 12:18 PM   Modules accepted: Orders

## 2016-05-20 NOTE — Telephone Encounter (Signed)
Can you let pt know I put in referral to rheumatology? We talked about it briefly on the phone yesterday. It might take a few days to get the appt and time but she should hear back from [place] soon

## 2016-05-21 DIAGNOSIS — E11649 Type 2 diabetes mellitus with hypoglycemia without coma: Secondary | ICD-10-CM | POA: Diagnosis not present

## 2016-05-21 DIAGNOSIS — E1143 Type 2 diabetes mellitus with diabetic autonomic (poly)neuropathy: Secondary | ICD-10-CM | POA: Diagnosis not present

## 2016-05-21 DIAGNOSIS — K604 Rectal fistula: Secondary | ICD-10-CM | POA: Diagnosis not present

## 2016-05-21 DIAGNOSIS — I130 Hypertensive heart and chronic kidney disease with heart failure and stage 1 through stage 4 chronic kidney disease, or unspecified chronic kidney disease: Secondary | ICD-10-CM | POA: Diagnosis not present

## 2016-05-21 DIAGNOSIS — E1122 Type 2 diabetes mellitus with diabetic chronic kidney disease: Secondary | ICD-10-CM | POA: Diagnosis not present

## 2016-05-21 DIAGNOSIS — N184 Chronic kidney disease, stage 4 (severe): Secondary | ICD-10-CM | POA: Diagnosis not present

## 2016-05-22 LAB — ANAEROBIC AND AEROBIC CULTURE

## 2016-05-23 ENCOUNTER — Ambulatory Visit (INDEPENDENT_AMBULATORY_CARE_PROVIDER_SITE_OTHER): Payer: Medicare Other | Admitting: Pediatrics

## 2016-05-23 DIAGNOSIS — E1143 Type 2 diabetes mellitus with diabetic autonomic (poly)neuropathy: Secondary | ICD-10-CM

## 2016-05-23 DIAGNOSIS — E1122 Type 2 diabetes mellitus with diabetic chronic kidney disease: Secondary | ICD-10-CM | POA: Diagnosis not present

## 2016-05-23 DIAGNOSIS — K604 Rectal fistula: Secondary | ICD-10-CM

## 2016-05-23 DIAGNOSIS — E11649 Type 2 diabetes mellitus with hypoglycemia without coma: Secondary | ICD-10-CM | POA: Diagnosis not present

## 2016-05-23 DIAGNOSIS — I130 Hypertensive heart and chronic kidney disease with heart failure and stage 1 through stage 4 chronic kidney disease, or unspecified chronic kidney disease: Secondary | ICD-10-CM | POA: Diagnosis not present

## 2016-05-23 DIAGNOSIS — N184 Chronic kidney disease, stage 4 (severe): Secondary | ICD-10-CM

## 2016-05-24 DIAGNOSIS — E11649 Type 2 diabetes mellitus with hypoglycemia without coma: Secondary | ICD-10-CM | POA: Diagnosis not present

## 2016-05-24 DIAGNOSIS — N184 Chronic kidney disease, stage 4 (severe): Secondary | ICD-10-CM | POA: Diagnosis not present

## 2016-05-24 DIAGNOSIS — K604 Rectal fistula: Secondary | ICD-10-CM | POA: Diagnosis not present

## 2016-05-24 DIAGNOSIS — E1143 Type 2 diabetes mellitus with diabetic autonomic (poly)neuropathy: Secondary | ICD-10-CM | POA: Diagnosis not present

## 2016-05-24 DIAGNOSIS — E1122 Type 2 diabetes mellitus with diabetic chronic kidney disease: Secondary | ICD-10-CM | POA: Diagnosis not present

## 2016-05-24 DIAGNOSIS — I130 Hypertensive heart and chronic kidney disease with heart failure and stage 1 through stage 4 chronic kidney disease, or unspecified chronic kidney disease: Secondary | ICD-10-CM | POA: Diagnosis not present

## 2016-05-26 DIAGNOSIS — E11649 Type 2 diabetes mellitus with hypoglycemia without coma: Secondary | ICD-10-CM | POA: Diagnosis not present

## 2016-05-26 DIAGNOSIS — E1143 Type 2 diabetes mellitus with diabetic autonomic (poly)neuropathy: Secondary | ICD-10-CM | POA: Diagnosis not present

## 2016-05-26 DIAGNOSIS — I130 Hypertensive heart and chronic kidney disease with heart failure and stage 1 through stage 4 chronic kidney disease, or unspecified chronic kidney disease: Secondary | ICD-10-CM | POA: Diagnosis not present

## 2016-05-26 DIAGNOSIS — N184 Chronic kidney disease, stage 4 (severe): Secondary | ICD-10-CM | POA: Diagnosis not present

## 2016-05-26 DIAGNOSIS — E1122 Type 2 diabetes mellitus with diabetic chronic kidney disease: Secondary | ICD-10-CM | POA: Diagnosis not present

## 2016-05-26 DIAGNOSIS — K604 Rectal fistula: Secondary | ICD-10-CM | POA: Diagnosis not present

## 2016-05-28 DIAGNOSIS — K604 Rectal fistula: Secondary | ICD-10-CM | POA: Diagnosis not present

## 2016-05-28 DIAGNOSIS — E1143 Type 2 diabetes mellitus with diabetic autonomic (poly)neuropathy: Secondary | ICD-10-CM | POA: Diagnosis not present

## 2016-05-28 DIAGNOSIS — E11649 Type 2 diabetes mellitus with hypoglycemia without coma: Secondary | ICD-10-CM | POA: Diagnosis not present

## 2016-05-28 DIAGNOSIS — I130 Hypertensive heart and chronic kidney disease with heart failure and stage 1 through stage 4 chronic kidney disease, or unspecified chronic kidney disease: Secondary | ICD-10-CM | POA: Diagnosis not present

## 2016-05-28 DIAGNOSIS — N184 Chronic kidney disease, stage 4 (severe): Secondary | ICD-10-CM | POA: Diagnosis not present

## 2016-05-28 DIAGNOSIS — E1122 Type 2 diabetes mellitus with diabetic chronic kidney disease: Secondary | ICD-10-CM | POA: Diagnosis not present

## 2016-05-31 DIAGNOSIS — E11649 Type 2 diabetes mellitus with hypoglycemia without coma: Secondary | ICD-10-CM | POA: Diagnosis not present

## 2016-05-31 DIAGNOSIS — K604 Rectal fistula: Secondary | ICD-10-CM | POA: Diagnosis not present

## 2016-05-31 DIAGNOSIS — E1143 Type 2 diabetes mellitus with diabetic autonomic (poly)neuropathy: Secondary | ICD-10-CM | POA: Diagnosis not present

## 2016-05-31 DIAGNOSIS — I130 Hypertensive heart and chronic kidney disease with heart failure and stage 1 through stage 4 chronic kidney disease, or unspecified chronic kidney disease: Secondary | ICD-10-CM | POA: Diagnosis not present

## 2016-05-31 DIAGNOSIS — N184 Chronic kidney disease, stage 4 (severe): Secondary | ICD-10-CM | POA: Diagnosis not present

## 2016-05-31 DIAGNOSIS — E1122 Type 2 diabetes mellitus with diabetic chronic kidney disease: Secondary | ICD-10-CM | POA: Diagnosis not present

## 2016-06-02 ENCOUNTER — Ambulatory Visit (INDEPENDENT_AMBULATORY_CARE_PROVIDER_SITE_OTHER): Payer: Medicare Other | Admitting: Pediatrics

## 2016-06-02 ENCOUNTER — Encounter: Payer: Self-pay | Admitting: Pediatrics

## 2016-06-02 VITALS — BP 100/51 | HR 57 | Temp 97.7°F | Ht 62.0 in | Wt 157.2 lb

## 2016-06-02 DIAGNOSIS — I130 Hypertensive heart and chronic kidney disease with heart failure and stage 1 through stage 4 chronic kidney disease, or unspecified chronic kidney disease: Secondary | ICD-10-CM | POA: Diagnosis not present

## 2016-06-02 DIAGNOSIS — E11649 Type 2 diabetes mellitus with hypoglycemia without coma: Secondary | ICD-10-CM | POA: Diagnosis not present

## 2016-06-02 DIAGNOSIS — N184 Chronic kidney disease, stage 4 (severe): Secondary | ICD-10-CM

## 2016-06-02 DIAGNOSIS — I10 Essential (primary) hypertension: Secondary | ICD-10-CM | POA: Diagnosis not present

## 2016-06-02 DIAGNOSIS — N189 Chronic kidney disease, unspecified: Secondary | ICD-10-CM

## 2016-06-02 DIAGNOSIS — E1122 Type 2 diabetes mellitus with diabetic chronic kidney disease: Secondary | ICD-10-CM

## 2016-06-02 DIAGNOSIS — L97519 Non-pressure chronic ulcer of other part of right foot with unspecified severity: Secondary | ICD-10-CM | POA: Diagnosis not present

## 2016-06-02 DIAGNOSIS — E039 Hypothyroidism, unspecified: Secondary | ICD-10-CM | POA: Diagnosis not present

## 2016-06-02 DIAGNOSIS — D631 Anemia in chronic kidney disease: Secondary | ICD-10-CM | POA: Diagnosis not present

## 2016-06-02 DIAGNOSIS — Z794 Long term (current) use of insulin: Secondary | ICD-10-CM | POA: Diagnosis not present

## 2016-06-02 DIAGNOSIS — M1A3791 Chronic gout due to renal impairment, unspecified ankle and foot, with tophus (tophi): Secondary | ICD-10-CM | POA: Diagnosis not present

## 2016-06-02 DIAGNOSIS — L97509 Non-pressure chronic ulcer of other part of unspecified foot with unspecified severity: Secondary | ICD-10-CM | POA: Insufficient documentation

## 2016-06-02 DIAGNOSIS — E1143 Type 2 diabetes mellitus with diabetic autonomic (poly)neuropathy: Secondary | ICD-10-CM | POA: Diagnosis not present

## 2016-06-02 DIAGNOSIS — K604 Rectal fistula: Secondary | ICD-10-CM | POA: Diagnosis not present

## 2016-06-02 LAB — BAYER DCA HB A1C WAIVED: HB A1C (BAYER DCA - WAIVED): 7 % — ABNORMAL HIGH (ref <7.0)

## 2016-06-02 MED ORDER — AMLODIPINE BESYLATE 5 MG PO TABS: 5.0000 mg | ORAL_TABLET | Freq: Every day | ORAL | 1 refills | Status: DC

## 2016-06-02 NOTE — Progress Notes (Signed)
Subjective:   Patient ID: [name], female    DOB: 1945/11/06, 71 y.o.   MRN: [id number] CC: Follow-up (2 week, gout bilateral feet)  HPI: [name] is a 71 y.o. female presenting for Follow-up (2 week, gout bilateral feet)  HTN: HH nurse checks BP three times a week, 128/70 last check  Dm2: 108 BGL Taking insulin regularly No recent low BGLs Has f/u with endocrine coming up  Foot wounds: following with wound clinic, Dr. 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 Allergies and medications reviewed and updated. History  Smoking Status  . Never Smoker  Smokeless Tobacco  . Never Used   ROS: Per HPI   Objective:    BP (!) 100/51   Pulse (!) 57   Temp 97.7 F (36.5 C) (Oral)   Ht '5\' 2"'  (1.575 m)   Wt 157 lb 3.2 oz (71.3 kg)   BMI 28.75 kg/m   Wt Readings from Last 3 Encounters:  06/02/16 157 lb 3.2 oz (71.3 kg)  05/18/16 158 lb 12.8 oz (72 kg)  05/05/16 158 lb 11.7 oz (72 kg)    Gen: NAD, alert, cooperative with exam, NCAT EYES: EOMI, no conjunctival injection, or no icterus CV: NRRR, normal S1/S2, no murmur, distal pulses 2+ b/l Resp: CTABL, no wheezes, normal WOB Ext: No edema, warm Neuro: Alert and oriented MSK: tophi present DIP joints several fingers, 3rd toe R foot. No redness or TTP Skin: no redness R foot, no tenderness around 107m ulceration on medial 1st MTP joint, white chalky substance consistent with uric acid present at the ulceration, 4 smaller 121m areas of erosion with clear, serous drainage below larger ulcer. No tenderness or redness. Flaking skin bottom of foot. Some brown-tinged serous drainage present on gauze wrapping L heel with some flaking skin, no discharge/drainage, no redness or tenderness, much improved.  Assessment & Plan:  HeBettyanneas seen today for follow-up multiple med problems  Diagnoses and all orders for this visit:  Type 2 diabetes mellitus with stage 4 chronic kidney disease, with long-term current use of insulin (HCC) A1c last 6.6, pt wants more recent lab drawn for upcoming appt with endocrine No recent lows -     Bayer DCA Hb A1c Waived  Essential hypertension Continues to have labile BPs No recent change to medication Normal numbers at home No lightheadedness Elevated initially, on low side at recheck Cont current meds, let me know if any side effects -     amLODipine (NORVASC) 5 MG tablet; Take 1 tablet (5 mg total) by mouth daily. 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, unspecified laterality Cont allopurinol, f/u with rheum in 2 weeks, no new tophi -     Uric acid  Anemia in chronic kidney disease, unspecified CKD stage Has appt with nephrology upcoming, from pt's description she  is on likely epogen and missed a shot with hospitalization No bleeding -     CBC with Differential/Platelet  Ulcer of right foot, unspecified ulcer stage ([place]) Much improved, does not appear infected, still draining Off antibiotics Cont wound care, follows up with wound appt tomorrow, has [facility] nursing helping with changes at home  Follow up plan: Return in about 3 months (around 08/31/2016). Assunta Found, MD [place]

## 2016-06-03 ENCOUNTER — Encounter (HOSPITAL_BASED_OUTPATIENT_CLINIC_OR_DEPARTMENT_OTHER): Payer: Medicare Other | Attending: Internal Medicine

## 2016-06-03 ENCOUNTER — Telehealth: Payer: Self-pay | Admitting: Pediatrics

## 2016-06-03 DIAGNOSIS — L97422 Non-pressure chronic ulcer of left heel and midfoot with fat layer exposed: Secondary | ICD-10-CM | POA: Insufficient documentation

## 2016-06-03 DIAGNOSIS — L97514 Non-pressure chronic ulcer of other part of right foot with necrosis of bone: Secondary | ICD-10-CM | POA: Diagnosis not present

## 2016-06-03 DIAGNOSIS — S91301A Unspecified open wound, right foot, initial encounter: Secondary | ICD-10-CM | POA: Diagnosis not present

## 2016-06-03 DIAGNOSIS — L97512 Non-pressure chronic ulcer of other part of right foot with fat layer exposed: Secondary | ICD-10-CM | POA: Insufficient documentation

## 2016-06-03 DIAGNOSIS — S91101A Unspecified open wound of right great toe without damage to nail, initial encounter: Secondary | ICD-10-CM | POA: Diagnosis not present

## 2016-06-03 DIAGNOSIS — E1122 Type 2 diabetes mellitus with diabetic chronic kidney disease: Secondary | ICD-10-CM | POA: Diagnosis not present

## 2016-06-03 DIAGNOSIS — L97421 Non-pressure chronic ulcer of left heel and midfoot limited to breakdown of skin: Secondary | ICD-10-CM | POA: Diagnosis not present

## 2016-06-03 DIAGNOSIS — S91302A Unspecified open wound, left foot, initial encounter: Secondary | ICD-10-CM | POA: Diagnosis not present

## 2016-06-03 DIAGNOSIS — M10071 Idiopathic gout, right ankle and foot: Secondary | ICD-10-CM | POA: Diagnosis not present

## 2016-06-03 DIAGNOSIS — N189 Chronic kidney disease, unspecified: Secondary | ICD-10-CM | POA: Diagnosis not present

## 2016-06-03 LAB — CBC WITH DIFFERENTIAL/PLATELET
BASOS ABS: 0 10*3/uL (ref 0.0–0.2)
BASOS: 1 %
EOS (ABSOLUTE): 0.4 10*3/uL (ref 0.0–0.4)
Eos: 4 %
Hematocrit: 35.4 % (ref 34.0–46.6)
Hemoglobin: 11.5 g/dL (ref 11.1–15.9)
IMMATURE GRANULOCYTES: 0 %
Immature Grans (Abs): 0 10*3/uL (ref 0.0–0.1)
LYMPHS: 26 %
Lymphocytes Absolute: 2.2 10*3/uL (ref 0.7–3.1)
MCH: 29 pg (ref 26.6–33.0)
MCHC: 32.5 g/dL (ref 31.5–35.7)
MCV: 89 fL (ref 79–97)
Monocytes Absolute: 0.5 10*3/uL (ref 0.1–0.9)
Monocytes: 6 %
NEUTROS PCT: 63 %
Neutrophils Absolute: 5.4 10*3/uL (ref 1.4–7.0)
Platelets: 233 10*3/uL (ref 150–379)
RBC: 3.97 x10E6/uL (ref 3.77–5.28)
RDW: 13.4 % (ref 12.3–15.4)
WBC: 8.5 10*3/uL (ref 3.4–10.8)

## 2016-06-03 LAB — CMP14+EGFR
ALT: 3 IU/L (ref 0–32)
AST: 14 IU/L (ref 0–40)
Albumin/Globulin Ratio: 1.4 (ref 1.2–2.2)
Albumin: 3.8 g/dL (ref 3.5–4.8)
Alkaline Phosphatase: 118 IU/L — ABNORMAL HIGH (ref 39–117)
BUN/Creatinine Ratio: 24 (ref 12–28)
BUN: 32 mg/dL — AB (ref 8–27)
Bilirubin Total: 0.4 mg/dL (ref 0.0–1.2)
CALCIUM: 9.1 mg/dL (ref 8.7–10.3)
CHLORIDE: 102 mmol/L (ref 96–106)
CO2: 20 mmol/L (ref 18–29)
Creatinine, Ser: 1.36 mg/dL — ABNORMAL HIGH (ref 0.57–1.00)
GFR, EST AFRICAN AMERICAN: 45 mL/min/{1.73_m2} — AB (ref >59)
GFR, EST NON AFRICAN AMERICAN: 39 mL/min/{1.73_m2} — AB (ref >59)
GLUCOSE: 182 mg/dL — AB (ref 65–99)
Globulin, Total: 2.8 g/dL (ref 1.5–4.5)
Potassium: 5.3 mmol/L — ABNORMAL HIGH (ref 3.5–5.2)
Sodium: 140 mmol/L (ref 134–144)
TOTAL PROTEIN: 6.6 g/dL (ref 6.0–8.5)

## 2016-06-03 LAB — URIC ACID: URIC ACID: 7 mg/dL (ref 2.5–7.1)

## 2016-06-03 LAB — TSH: TSH: 2.12 u[IU]/mL (ref 0.450–4.500)

## 2016-06-03 NOTE — Telephone Encounter (Signed)
Pt calling wanting her test results from yesterday.

## 2016-06-04 DIAGNOSIS — E1122 Type 2 diabetes mellitus with diabetic chronic kidney disease: Secondary | ICD-10-CM | POA: Diagnosis not present

## 2016-06-04 DIAGNOSIS — I130 Hypertensive heart and chronic kidney disease with heart failure and stage 1 through stage 4 chronic kidney disease, or unspecified chronic kidney disease: Secondary | ICD-10-CM | POA: Diagnosis not present

## 2016-06-04 DIAGNOSIS — E1143 Type 2 diabetes mellitus with diabetic autonomic (poly)neuropathy: Secondary | ICD-10-CM | POA: Diagnosis not present

## 2016-06-04 DIAGNOSIS — N184 Chronic kidney disease, stage 4 (severe): Secondary | ICD-10-CM | POA: Diagnosis not present

## 2016-06-04 DIAGNOSIS — K604 Rectal fistula: Secondary | ICD-10-CM | POA: Diagnosis not present

## 2016-06-04 DIAGNOSIS — E11649 Type 2 diabetes mellitus with hypoglycemia without coma: Secondary | ICD-10-CM | POA: Diagnosis not present

## 2016-06-07 DIAGNOSIS — E1143 Type 2 diabetes mellitus with diabetic autonomic (poly)neuropathy: Secondary | ICD-10-CM | POA: Diagnosis not present

## 2016-06-07 DIAGNOSIS — E11649 Type 2 diabetes mellitus with hypoglycemia without coma: Secondary | ICD-10-CM | POA: Diagnosis not present

## 2016-06-07 DIAGNOSIS — K604 Rectal fistula: Secondary | ICD-10-CM | POA: Diagnosis not present

## 2016-06-07 DIAGNOSIS — I130 Hypertensive heart and chronic kidney disease with heart failure and stage 1 through stage 4 chronic kidney disease, or unspecified chronic kidney disease: Secondary | ICD-10-CM | POA: Diagnosis not present

## 2016-06-07 DIAGNOSIS — E1122 Type 2 diabetes mellitus with diabetic chronic kidney disease: Secondary | ICD-10-CM | POA: Diagnosis not present

## 2016-06-07 DIAGNOSIS — N184 Chronic kidney disease, stage 4 (severe): Secondary | ICD-10-CM | POA: Diagnosis not present

## 2016-06-09 DIAGNOSIS — E11649 Type 2 diabetes mellitus with hypoglycemia without coma: Secondary | ICD-10-CM | POA: Diagnosis not present

## 2016-06-09 DIAGNOSIS — E1122 Type 2 diabetes mellitus with diabetic chronic kidney disease: Secondary | ICD-10-CM | POA: Diagnosis not present

## 2016-06-09 DIAGNOSIS — K604 Rectal fistula: Secondary | ICD-10-CM | POA: Diagnosis not present

## 2016-06-09 DIAGNOSIS — I130 Hypertensive heart and chronic kidney disease with heart failure and stage 1 through stage 4 chronic kidney disease, or unspecified chronic kidney disease: Secondary | ICD-10-CM | POA: Diagnosis not present

## 2016-06-09 DIAGNOSIS — N184 Chronic kidney disease, stage 4 (severe): Secondary | ICD-10-CM | POA: Diagnosis not present

## 2016-06-09 DIAGNOSIS — E1143 Type 2 diabetes mellitus with diabetic autonomic (poly)neuropathy: Secondary | ICD-10-CM | POA: Diagnosis not present

## 2016-06-11 DIAGNOSIS — N184 Chronic kidney disease, stage 4 (severe): Secondary | ICD-10-CM | POA: Diagnosis not present

## 2016-06-11 DIAGNOSIS — I130 Hypertensive heart and chronic kidney disease with heart failure and stage 1 through stage 4 chronic kidney disease, or unspecified chronic kidney disease: Secondary | ICD-10-CM | POA: Diagnosis not present

## 2016-06-11 DIAGNOSIS — E1143 Type 2 diabetes mellitus with diabetic autonomic (poly)neuropathy: Secondary | ICD-10-CM | POA: Diagnosis not present

## 2016-06-11 DIAGNOSIS — K604 Rectal fistula: Secondary | ICD-10-CM | POA: Diagnosis not present

## 2016-06-11 DIAGNOSIS — E11649 Type 2 diabetes mellitus with hypoglycemia without coma: Secondary | ICD-10-CM | POA: Diagnosis not present

## 2016-06-11 DIAGNOSIS — E1122 Type 2 diabetes mellitus with diabetic chronic kidney disease: Secondary | ICD-10-CM | POA: Diagnosis not present

## 2016-06-14 DIAGNOSIS — E1143 Type 2 diabetes mellitus with diabetic autonomic (poly)neuropathy: Secondary | ICD-10-CM | POA: Diagnosis not present

## 2016-06-14 DIAGNOSIS — E11649 Type 2 diabetes mellitus with hypoglycemia without coma: Secondary | ICD-10-CM | POA: Diagnosis not present

## 2016-06-14 DIAGNOSIS — K604 Rectal fistula: Secondary | ICD-10-CM | POA: Diagnosis not present

## 2016-06-14 DIAGNOSIS — I130 Hypertensive heart and chronic kidney disease with heart failure and stage 1 through stage 4 chronic kidney disease, or unspecified chronic kidney disease: Secondary | ICD-10-CM | POA: Diagnosis not present

## 2016-06-14 DIAGNOSIS — E1122 Type 2 diabetes mellitus with diabetic chronic kidney disease: Secondary | ICD-10-CM | POA: Diagnosis not present

## 2016-06-14 DIAGNOSIS — N184 Chronic kidney disease, stage 4 (severe): Secondary | ICD-10-CM | POA: Diagnosis not present

## 2016-06-15 ENCOUNTER — Other Ambulatory Visit: Payer: Self-pay | Admitting: Pediatrics

## 2016-06-15 DIAGNOSIS — M1A3791 Chronic gout due to renal impairment, unspecified ankle and foot, with tophus (tophi): Secondary | ICD-10-CM

## 2016-06-16 ENCOUNTER — Other Ambulatory Visit: Payer: Self-pay | Admitting: Pediatrics

## 2016-06-16 DIAGNOSIS — I130 Hypertensive heart and chronic kidney disease with heart failure and stage 1 through stage 4 chronic kidney disease, or unspecified chronic kidney disease: Secondary | ICD-10-CM | POA: Diagnosis not present

## 2016-06-16 DIAGNOSIS — M1A3791 Chronic gout due to renal impairment, unspecified ankle and foot, with tophus (tophi): Secondary | ICD-10-CM

## 2016-06-16 DIAGNOSIS — E1122 Type 2 diabetes mellitus with diabetic chronic kidney disease: Secondary | ICD-10-CM | POA: Diagnosis not present

## 2016-06-16 DIAGNOSIS — E1143 Type 2 diabetes mellitus with diabetic autonomic (poly)neuropathy: Secondary | ICD-10-CM | POA: Diagnosis not present

## 2016-06-16 DIAGNOSIS — K604 Rectal fistula: Secondary | ICD-10-CM | POA: Diagnosis not present

## 2016-06-16 DIAGNOSIS — N184 Chronic kidney disease, stage 4 (severe): Secondary | ICD-10-CM | POA: Diagnosis not present

## 2016-06-16 DIAGNOSIS — E11649 Type 2 diabetes mellitus with hypoglycemia without coma: Secondary | ICD-10-CM | POA: Diagnosis not present

## 2016-06-17 ENCOUNTER — Ambulatory Visit: Payer: Medicare Other | Admitting: "Endocrinology

## 2016-06-17 DIAGNOSIS — E663 Overweight: Secondary | ICD-10-CM | POA: Diagnosis not present

## 2016-06-17 DIAGNOSIS — M2550 Pain in unspecified joint: Secondary | ICD-10-CM | POA: Diagnosis not present

## 2016-06-17 DIAGNOSIS — Z6828 Body mass index (BMI) 28.0-28.9, adult: Secondary | ICD-10-CM | POA: Diagnosis not present

## 2016-06-17 DIAGNOSIS — N183 Chronic kidney disease, stage 3 (moderate): Secondary | ICD-10-CM | POA: Diagnosis not present

## 2016-06-17 DIAGNOSIS — M1A09X1 Idiopathic chronic gout, multiple sites, with tophus (tophi): Secondary | ICD-10-CM | POA: Diagnosis not present

## 2016-06-18 ENCOUNTER — Encounter (HOSPITAL_BASED_OUTPATIENT_CLINIC_OR_DEPARTMENT_OTHER): Payer: Self-pay | Admitting: *Deleted

## 2016-06-18 DIAGNOSIS — E11649 Type 2 diabetes mellitus with hypoglycemia without coma: Secondary | ICD-10-CM | POA: Diagnosis not present

## 2016-06-18 DIAGNOSIS — E1122 Type 2 diabetes mellitus with diabetic chronic kidney disease: Secondary | ICD-10-CM | POA: Diagnosis not present

## 2016-06-18 DIAGNOSIS — N184 Chronic kidney disease, stage 4 (severe): Secondary | ICD-10-CM | POA: Diagnosis not present

## 2016-06-18 DIAGNOSIS — E1143 Type 2 diabetes mellitus with diabetic autonomic (poly)neuropathy: Secondary | ICD-10-CM | POA: Diagnosis not present

## 2016-06-18 DIAGNOSIS — I130 Hypertensive heart and chronic kidney disease with heart failure and stage 1 through stage 4 chronic kidney disease, or unspecified chronic kidney disease: Secondary | ICD-10-CM | POA: Diagnosis not present

## 2016-06-18 DIAGNOSIS — K604 Rectal fistula: Secondary | ICD-10-CM | POA: Diagnosis not present

## 2016-06-18 NOTE — Progress Notes (Addendum)
Pt instructed npo p mn 1/31 x norvasc, allopurinol, synthroid w sip of water.  To [facility] 2/1 @ 0930.  Needs istat 8 on arrival.  ekg in epic

## 2016-06-21 ENCOUNTER — Encounter: Payer: Self-pay | Admitting: "Endocrinology

## 2016-06-21 ENCOUNTER — Ambulatory Visit (INDEPENDENT_AMBULATORY_CARE_PROVIDER_SITE_OTHER): Payer: Medicare Other | Admitting: "Endocrinology

## 2016-06-21 VITALS — BP 96/58 | HR 63 | Ht 62.0 in | Wt 157.0 lb

## 2016-06-21 DIAGNOSIS — I10 Essential (primary) hypertension: Secondary | ICD-10-CM

## 2016-06-21 DIAGNOSIS — E1143 Type 2 diabetes mellitus with diabetic autonomic (poly)neuropathy: Secondary | ICD-10-CM | POA: Diagnosis not present

## 2016-06-21 DIAGNOSIS — E039 Hypothyroidism, unspecified: Secondary | ICD-10-CM

## 2016-06-21 DIAGNOSIS — E11649 Type 2 diabetes mellitus with hypoglycemia without coma: Secondary | ICD-10-CM | POA: Diagnosis not present

## 2016-06-21 DIAGNOSIS — N184 Chronic kidney disease, stage 4 (severe): Secondary | ICD-10-CM | POA: Diagnosis not present

## 2016-06-21 DIAGNOSIS — E782 Mixed hyperlipidemia: Secondary | ICD-10-CM

## 2016-06-21 DIAGNOSIS — E1122 Type 2 diabetes mellitus with diabetic chronic kidney disease: Secondary | ICD-10-CM | POA: Diagnosis not present

## 2016-06-21 DIAGNOSIS — I130 Hypertensive heart and chronic kidney disease with heart failure and stage 1 through stage 4 chronic kidney disease, or unspecified chronic kidney disease: Secondary | ICD-10-CM | POA: Diagnosis not present

## 2016-06-21 DIAGNOSIS — K604 Rectal fistula: Secondary | ICD-10-CM | POA: Diagnosis not present

## 2016-06-21 DIAGNOSIS — Z794 Long term (current) use of insulin: Secondary | ICD-10-CM | POA: Diagnosis not present

## 2016-06-21 MED ORDER — GLUCOSE BLOOD VI STRP: ORAL_STRIP | 3 refills | Status: DC

## 2016-06-21 NOTE — Progress Notes (Signed)
Subjective:    Patient ID: [name], female    DOB: 03-20-1946. Patient is to follow-up for uncontrolled type 2 diabetes with her meter and logs.  Past Medical History:  Diagnosis Date  . Anemia in chronic renal disease    Aranesp injection --  when Hg <11, last injection 12-24-14.  Marland Kitchen Anxiety   . Arthritis    knees and hand/fingers. "broke back"-being evaluated for this"weakness left leg"  . CKD (chronic kidney disease), stage III    nephrologist--  dr Mercy Moore  . Complication of anesthesia    post-op confusion   . Constipation   . Coronary artery disease   . Diabetic gastroparesis ([place])   . Diabetic retinopathy ([place])    bilateral --  monitored by dr [name]  . Diverticulosis of colon   . GAD (generalized anxiety disorder)   . GERD (gastroesophageal reflux disease)   . History of colon polyps    benign  . History of esophagitis   . History of GI bleed    upper 2009  due to esophagitis  &  2002  due to Mallory-Weiss tear  . History of gout    in issues for several years  . 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 History of rectal abscess    12-29-2004  bedside I & D  . Hyperlipidemia   . Hypertension   . Hypothyroidism   . LAFB (left anterior fascicular block)   . Right bundle branch block   . Sacral decubitus ulcer    since 2014- 01-02-15 remains with wound" gauze dressing changes daily.  . Type II diabetes mellitus ([place])    Past Surgical History:  Procedure Laterality Date  . APPLICATION OF A-CELL OF EXTREMITY N/A 04/07/2015   Procedure: A CELL PLACMENT;  Surgeon: Loel Lofty Dillingham, DO;  Location: [facility];  Service: Plastics;  Laterality: N/A;  . CARDIOVASCULAR STRESS TEST  12-30-2004   normal perfusion study/  no ischemia or infartion/  normal LV wall function and wall motion , ef 66%  . CATARACT EXTRACTION W/ INTRAOCULAR LENS  IMPLANT, BILATERAL  1995  . COLONOSCOPY W/ POLYPECTOMY  last one 2008  . COMPRESSION HIP SCREW Right  05/18/2014   Procedure: COMPRESSION HIP;  Surgeon: Carole Civil, MD;  Location: [facility];  Service: Orthopedics;  Laterality: Right;  . ESOPHAGOGASTRODUODENOSCOPY  last one 01-09-2011  . EVALUATION UNDER ANESTHESIA WITH FISTULECTOMY N/A 01/06/2015   Procedure: EXAM UNDER ANESTHESIA , placement of seton;  Surgeon: Jackolyn Confer, MD;  Location: [facility];  Service: General;  Laterality: N/A;  . I&D EXTREMITY N/A 04/07/2015   Procedure: IRRIGATION AND DEBRIDEMENT ISCHIAL ULCER;  Surgeon: Loel Lofty Dillingham, DO;  Location: [facility];  Service: Plastics;  Laterality: N/A;  . INCISION AND DRAINAGE OF WOUND N/A 09/30/2014   Procedure: IRRIGATION AND DEBRIDEMENT SACRAL WOUND, EXCISION OF PERIRECTAL TRACT WITH PLACEMENT OF ACCELL;  Surgeon: Theodoro Kos, DO;  Location: [facility];  Service: Plastics;  Laterality: N/A;  . ORIF FEMUR FRACTURE Left 10/09/2012   Procedure: OPEN REDUCTION INTERNAL FIXATION (ORIF) DISTAL FEMUR FRACTURE;  Surgeon: Rozanna Box, MD;  Location: [facility];  Service: Orthopedics;  Laterality: Left;  . RETINOPATHY SURGERY Bilateral 1980's?  . TRANSTHORACIC ECHOCARDIOGRAM  02-18-2011   mild LVH,  ef 55-60%,  grade I diastolic dysfunction/  mild TR/  RV systolic pressure increased consistant with moderate pulmonary hypertension   Social History   Social History  .  Marital status: Married    Spouse name: [name]  . Number of children: 0  . Years of education: 12   Occupational History  . Retired Retired   Social History Main Topics  . Smoking status: Never Smoker  . Smokeless tobacco: Never Used  . Alcohol use No  . Drug use: No  . Sexual activity: Not Currently   Other Topics Concern  . None   Social History Narrative   Lives with husband.    Caffeine use: none   Outpatient Encounter Prescriptions as of 06/21/2016  Medication Sig  . Colchicine 0.6 MG CAPS Take by mouth daily.  . febuxostat (ULORIC) 40 MG tablet Take 40 mg by mouth daily.  Marland Kitchen allopurinol  (ZYLOPRIM) 100 MG tablet Take 1 tablet (100 mg total) by mouth daily.  Marland Kitchen amLODipine (NORVASC) 5 MG tablet Take 1 tablet (5 mg total) by mouth daily.  Marland Kitchen atorvastatin (LIPITOR) 40 MG tablet Take 1 tablet (40 mg total) by mouth every evening.  . Blood Glucose Monitoring Suppl (ACCU-CHEK AVIVA PLUS) w/Device KIT Use as directed 4 times daily  . glucose blood (ACCU-CHEK AVIVA) test strip Use as instructed 4 x daily. E11.65  . glucose blood (ACCU-CHEK AVIVA) test strip Use to test glucose 3 times a day  . insulin aspart protamine - aspart (NOVOLOG MIX 70/30 FLEXPEN) (70-30) 100 UNIT/ML FlexPen Inject 0.08 mLs (8 Units total) into the skin daily with breakfast. Only if blood glucose is above 90  . Insulin Pen Needle (UNIFINE PENTIPS) 32G X 4 MM MISC Use as directed bid. E11.65  . Lancets (ACCU-CHEK SOFT TOUCH) lancets Use as instructed 4 x daily, E11.65  . levothyroxine (SYNTHROID, LEVOTHROID) 75 MCG tablet Take 1 tablet (75 mcg total) by mouth daily before breakfast.  . metoprolol succinate (TOPROL-XL) 50 MG 24 hr tablet Take 1 tablet (50 mg total) by mouth every morning.  . pantoprazole (PROTONIX) 20 MG tablet Take 1 tablet (20 mg total) by mouth daily.  Marland Kitchen UNIFINE PENTIPS 32G X 4 MM MISC   . [DISCONTINUED] allopurinol (ZYLOPRIM) 100 MG tablet TAKE 1 TABLET (100 MG TOTAL) BY MOUTH DAILY.   No facility-administered encounter medications on file as of 06/21/2016.    ALLERGIES: Allergies  Allergen Reactions  . Aspirin Nausea And Vomiting  . Ciprofloxacin Other (See Comments)    Upset Stomach  . Codeine Nausea And Vomiting    Makes me sick   . Micardis [Telmisartan] Other (See Comments)    unknown  . Nexium [Esomeprazole Magnesium] Other (See Comments)    Causes internal bleeding  . Niaspan [Niacin Er] Other (See Comments)    Reaction is unknown  . Onglyza [Saxagliptin] Other (See Comments)    Reaction is unknown  . Other     No otc pain medications  . Rofecoxib Other (See Comments)     unknown  . Simvastatin   . Tequin [Gatifloxacin] Other (See Comments)    Reaction is unknown  . Welchol [Colesevelam Hcl]   . Amoxicillin Rash   VACCINATION STATUS: Immunization History  Administered Date(s) Administered  . Influenza, High Dose Seasonal PF 03/05/2016  . Influenza,inj,Quad PF,36+ Mos 02/16/2013, 02/27/2015  . Pneumococcal Conjugate-13 10/11/2014  . Pneumococcal Polysaccharide-23 10/10/2012  . Tdap 10/06/2012    Diabetes  She presents for her follow-up diabetic visit. She has type 2 diabetes mellitus. Onset time: She was diagnosed at approximate age of 75 years . Her disease course has been improving. There are no hypoglycemic associated symptoms. Pertinent negatives for  hypoglycemia include no confusion, headaches, pallor or seizures. Associated symptoms include fatigue and visual change. Pertinent negatives for diabetes include no chest pain, no polydipsia and no polyphagia. There are no hypoglycemic complications. Symptoms are improving. Diabetic complications include nephropathy, peripheral neuropathy, PVD and retinopathy. Risk factors for coronary artery disease include diabetes mellitus, dyslipidemia, hypertension and sedentary lifestyle. Current diabetic treatment includes oral agent (monotherapy). She is compliant with treatment most of the time. Weight trend: Wheelchair-bound. She is following a generally unhealthy diet. When asked about meal planning, she reported none. She has not had a previous visit with a dietitian. She never participates in exercise. Her home blood glucose trend is increasing steadily (Her glucose profile today is acceptable between 100- 200 mg per DL average.). Her breakfast blood glucose range is generally 110-130 mg/dl. Her dinner blood glucose range is generally 180-200 mg/dl. Her overall blood glucose range is 140-180 mg/dl. An ACE inhibitor/angiotensin II receptor blocker is being taken. Eye exam is current.  Thyroid Problem  Presents for  initial visit. Onset time: 15 years. Symptoms include fatigue and visual change. Patient reports no cold intolerance, diarrhea, heat intolerance or palpitations. The symptoms have been stable. Past treatments include levothyroxine. The following procedures have not been performed: thyroidectomy.  Hypertension  This is a chronic problem. The current episode started more than 1 year ago. Pertinent negatives include no chest pain, headaches, palpitations or shortness of breath. Past treatments include angiotensin blockers. Hypertensive end-organ damage includes PVD and retinopathy. Identifiable causes of hypertension include a thyroid problem.      Review of Systems  Constitutional: Positive for fatigue. Negative for unexpected weight change.  HENT: Negative for trouble swallowing and voice change.   Eyes: Negative for visual disturbance.  Respiratory: Negative for cough, shortness of breath and wheezing.   Cardiovascular: Negative for chest pain, palpitations and leg swelling.  Gastrointestinal: Negative for diarrhea, nausea and vomiting.  Endocrine: Negative for cold intolerance, heat intolerance, polydipsia and polyphagia.  Musculoskeletal: Positive for arthralgias, back pain, gait problem, joint swelling and myalgias.          Skin: Negative for color change, pallor, rash and wound.  Neurological: Negative for seizures and headaches.  Psychiatric/Behavioral: Negative for confusion and suicidal ideas.    Objective:    BP (!) 96/58   Pulse 63   Ht 5\' 2"  (1.575 m)   Wt 157 lb (71.2 kg)   BMI 28.72 kg/m   Wt Readings from Last 3 Encounters:  06/21/16 157 lb (71.2 kg)  06/02/16 157 lb 3.2 oz (71.3 kg)  05/18/16 158 lb 12.8 oz (72 kg)    Physical Exam  Constitutional: She is oriented to person, place, and time. She appears well-developed.  HENT:  Head: Normocephalic and atraumatic.  Eyes: EOM are normal.  Neck: Normal range of motion. Neck supple. No tracheal deviation present. No  thyromegaly present.  Cardiovascular: Normal rate and regular rhythm.  Exam reveals decreased pulses.   Pulses:      Dorsalis pedis pulses are 0 on the right side, and 0 on the left side.       Posterior tibial pulses are 0 on the right side, and 0 on the left side.  Pulmonary/Chest: Effort normal and breath sounds normal.  Abdominal: Soft. Bowel sounds are normal. There is no tenderness. There is no guarding.  Musculoskeletal: She exhibits no edema.       Arms: She is wheelchair-bound due to recent hip fracture and diffuse debilitating arthritis.  Neurological: She is  alert and oriented to person, place, and time. She has normal reflexes. A sensory deficit is present. No cranial nerve deficit. Coordination normal.  Skin: Skin is warm and dry. No rash noted. No erythema. No pallor.  Psychiatric: She has a normal mood and affect. Judgment normal.    Results for orders placed or performed in visit on 06/02/16  CBC with Differential/Platelet  Result Value Ref Range   WBC 8.5 3.4 - 10.8 x10E3/uL   RBC 3.97 3.77 - 5.28 x10E6/uL   Hemoglobin 11.5 11.1 - 15.9 g/dL   Hematocrit 35.4 34.0 - 46.6 %   MCV 89 79 - 97 fL   MCH 29.0 26.6 - 33.0 pg   MCHC 32.5 31.5 - 35.7 g/dL   RDW 13.4 12.3 - 15.4 %   Platelets 233 150 - 379 x10E3/uL   Neutrophils 63 Not Estab. %   Lymphs 26 Not Estab. %   Monocytes 6 Not Estab. %   Eos 4 Not Estab. %   Basos 1 Not Estab. %   Neutrophils Absolute 5.4 1.4 - 7.0 x10E3/uL   Lymphocytes Absolute 2.2 0.7 - 3.1 x10E3/uL   Monocytes Absolute 0.5 0.1 - 0.9 x10E3/uL   EOS (ABSOLUTE) 0.4 0.0 - 0.4 x10E3/uL   Basophils Absolute 0.0 0.0 - 0.2 x10E3/uL   Immature Granulocytes 0 Not Estab. %   Immature Grans (Abs) 0.0 0.0 - 0.1 x10E3/uL  CMP14+EGFR  Result Value Ref Range   Glucose 182 (H) 65 - 99 mg/dL   BUN 32 (H) 8 - 27 mg/dL   Creatinine, Ser 1.36 (H) 0.57 - 1.00 mg/dL   GFR calc non Af Amer 39 (L) >59 mL/min/1.73   GFR calc Af Amer 45 (L) >59 mL/min/1.73    BUN/Creatinine Ratio 24 12 - 28   Sodium 140 134 - 144 mmol/L   Potassium 5.3 (H) 3.5 - 5.2 mmol/L   Chloride 102 96 - 106 mmol/L   CO2 20 18 - 29 mmol/L   Calcium 9.1 8.7 - 10.3 mg/dL   Total Protein 6.6 6.0 - 8.5 g/dL   Albumin 3.8 3.5 - 4.8 g/dL   Globulin, Total 2.8 1.5 - 4.5 g/dL   Albumin/Globulin Ratio 1.4 1.2 - 2.2   Bilirubin Total 0.4 0.0 - 1.2 mg/dL   Alkaline Phosphatase 118 (H) 39 - 117 IU/L   AST 14 0 - 40 IU/L   ALT 3 0 - 32 IU/L  TSH  Result Value Ref Range   TSH 2.120 0.450 - 4.500 uIU/mL  Uric acid  Result Value Ref Range   Uric Acid 7.0 2.5 - 7.1 mg/dL  Bayer DCA Hb A1c Waived  Result Value Ref Range   Bayer DCA Hb A1c Waived 7.0 (H) <7.0 %   Diabetic Labs (most recent): Lab Results  Component Value Date   HGBA1C 6.6 (H) 05/05/2016   HGBA1C 7.3 (H) 11/21/2015   HGBA1C 7.6 07/09/2015   Lipid Panel     Component Value Date/Time   CHOL 159 03/05/2016 0904   CHOL 105 09/11/2012 1057   TRIG 157 (H) 03/05/2016 0904   TRIG 170 (H) 10/11/2014 1159   TRIG 79 09/11/2012 1057   HDL 33 (L) 03/05/2016 0904   HDL 53 10/11/2014 1159   HDL 41 09/11/2012 1057   CHOLHDL 4.8 (H) 03/05/2016 0904   LDLCALC 95 03/05/2016 0904   LDLCALC 50 02/14/2014 1012   LDLCALC 48 09/11/2012 1057     Assessment & Plan:   1. Type 2 diabetes mellitus with  stage 4 chronic kidney disease, without long-term current use of insulin ([place])   - Patient has currently uncontrolled symptomatic type 2 DM since  71 years of age,  with most recent A1c of 7% , generally improving from  10 %. Recent labs reviewed. - Her renal function is improving.   -her  diabetes is complicated by PAD, CKD, neuropathy and patient remains at a high risk for more acute and chronic complications of diabetes which include CAD, CVA, CKD, retinopathy, and neuropathy. These are all discussed in detail with the patient.  - I have counseled the patient on diet management  by adopting a carbohydrate  restricted/protein rich diet.  - Suggestion is made for patient to avoid simple carbohydrates   from their diet including Cakes , Desserts, Ice Cream,  Soda (  diet and regular) , Sweet Tea , Candies,  Chips, Cookies, Artificial Sweeteners,   and "Sugar-free" Products . This will help patient to have stable blood glucose profile and potentially avoid unintended weight gain.  - I encouraged the patient to switch to  unprocessed or minimally processed complex starch and increased protein intake (animal or plant source), fruits, and vegetables.  - Patient is advised to stick to a routine mealtimes to eat 3 meals  a day and avoid unnecessary snacks ( to snack only to correct hypoglycemia).  - The patient will be scheduled with Jearld Fenton, RDN, CDE for individualized DM education.  - I have approached patient with the following individualized plan to manage diabetes and patient agrees:   - She came with better and safer blood glucose profile .  -Her husband continues to offer to help. I will continue NovoLog 70/30   8 units only with breakfast for pre-meal glucose above 90 mg/dL associated with monitoring of blood glucose  before meals and at bedtime. - After she finishes her current supply of NovoLog 70/30, she will be switched to basal insulin covered by her insurance, her choices include Lantus, Levemir, [name], and Toujeo.  - First priority in this patient would be to avoid hypoglycemia.  if she cannot perform this therapy even with the help of her husband, her best option is placement in skilled care facility.  -Patient is encouraged to call clinic for blood glucose levels less than 70 or above 300 mg /dl.  -Patient is not a candidate for MTF, Incretin therapy.   - Patient specific target  A1c;  LDL, HDL, Triglycerides, and  Waist Circumference were discussed in detail.  2) BP/HTN: Controlled. Continue current medications including ACEI/ARB. 3) Lipids/HPL:  continue statins. 4)   Weight/Diet: CDE Consult has been initiated, she has limited ability to exercise.  5)  Hypothyroidism:  - continue LT4 75 mcg po qam.  - We discussed about correct intake of levothyroxine, at fasting, with water, separated by at least 30 minutes from breakfast, and separated by more than 4 hours from calcium, iron, multivitamins, acid reflux medications (PPIs). -Patient is made aware of the fact that thyroid hormone replacement is needed for life, dose to be adjusted by periodic monitoring of thyroid function tests.  5) Chronic Care/Health Maintenance:  -Patient is  on ACEI/ARB and Statin medications and encouraged to continue to follow up with Ophthalmology, Podiatrist at least yearly or according to recommendations, and advised to   stay away from smoking. I have recommended yearly flu vaccine and pneumonia vaccination at least every 5 years; moderate intensity exercise for up to 150 minutes weekly; and  sleep for at least  7 hours a day.  - 20 minutes of time was spent on the care of this patient , 50% of which was applied for counseling on diabetes complications and their preventions.  - Patient to bring meter and  blood glucose logs during their next visit in 2 weeks.   - I advised patient to maintain close follow up with Eustaquio Maize, MD for primary care needs.  Follow up plan: - Return in about 3 months (around 09/19/2016) for follow up with pre-visit labs, meter, and logs.  Glade Lloyd, MD Phone: [phone]  Fax: [phone]   06/21/2016, 11:19 AM

## 2016-06-22 ENCOUNTER — Other Ambulatory Visit: Payer: Self-pay

## 2016-06-22 MED ORDER — GLUCOSE BLOOD VI STRP: ORAL_STRIP | 5 refills | Status: DC

## 2016-06-23 DIAGNOSIS — N184 Chronic kidney disease, stage 4 (severe): Secondary | ICD-10-CM | POA: Diagnosis not present

## 2016-06-23 DIAGNOSIS — E11649 Type 2 diabetes mellitus with hypoglycemia without coma: Secondary | ICD-10-CM | POA: Diagnosis not present

## 2016-06-23 DIAGNOSIS — E1143 Type 2 diabetes mellitus with diabetic autonomic (poly)neuropathy: Secondary | ICD-10-CM | POA: Diagnosis not present

## 2016-06-23 DIAGNOSIS — I130 Hypertensive heart and chronic kidney disease with heart failure and stage 1 through stage 4 chronic kidney disease, or unspecified chronic kidney disease: Secondary | ICD-10-CM | POA: Diagnosis not present

## 2016-06-23 DIAGNOSIS — E1122 Type 2 diabetes mellitus with diabetic chronic kidney disease: Secondary | ICD-10-CM | POA: Diagnosis not present

## 2016-06-23 DIAGNOSIS — K604 Rectal fistula: Secondary | ICD-10-CM | POA: Diagnosis not present

## 2016-06-24 ENCOUNTER — Encounter (HOSPITAL_BASED_OUTPATIENT_CLINIC_OR_DEPARTMENT_OTHER): Payer: Self-pay

## 2016-06-24 ENCOUNTER — Observation Stay (HOSPITAL_BASED_OUTPATIENT_CLINIC_OR_DEPARTMENT_OTHER)
Admission: RE | Admit: 2016-06-24 | Discharge: 2016-06-25 | Disposition: A | Discharge status: Home / Self Care | Payer: Medicare Other | Source: Ambulatory Visit | Attending: Internal Medicine | Admitting: Internal Medicine

## 2016-06-24 ENCOUNTER — Other Ambulatory Visit: Payer: Self-pay

## 2016-06-24 ENCOUNTER — Encounter (HOSPITAL_COMMUNITY)
Admission: RE | Disposition: A | Discharge status: Home / Self Care | Payer: Self-pay | Source: Ambulatory Visit | Attending: Emergency Medicine

## 2016-06-24 ENCOUNTER — Ambulatory Visit (HOSPITAL_BASED_OUTPATIENT_CLINIC_OR_DEPARTMENT_OTHER): Payer: Medicare Other | Admitting: Anesthesiology

## 2016-06-24 DIAGNOSIS — E11319 Type 2 diabetes mellitus with unspecified diabetic retinopathy without macular edema: Secondary | ICD-10-CM | POA: Insufficient documentation

## 2016-06-24 DIAGNOSIS — I2510 Atherosclerotic heart disease of native coronary artery without angina pectoris: Secondary | ICD-10-CM | POA: Diagnosis not present

## 2016-06-24 DIAGNOSIS — K603 Anal fistula: Secondary | ICD-10-CM | POA: Insufficient documentation

## 2016-06-24 DIAGNOSIS — Z86010 Personal history of colonic polyps: Secondary | ICD-10-CM | POA: Insufficient documentation

## 2016-06-24 DIAGNOSIS — Z794 Long term (current) use of insulin: Secondary | ICD-10-CM | POA: Diagnosis not present

## 2016-06-24 DIAGNOSIS — I129 Hypertensive chronic kidney disease with stage 1 through stage 4 chronic kidney disease, or unspecified chronic kidney disease: Secondary | ICD-10-CM | POA: Diagnosis not present

## 2016-06-24 DIAGNOSIS — E875 Hyperkalemia: Secondary | ICD-10-CM | POA: Diagnosis not present

## 2016-06-24 DIAGNOSIS — M1A9XX0 Chronic gout, unspecified, without tophus (tophi): Secondary | ICD-10-CM | POA: Insufficient documentation

## 2016-06-24 DIAGNOSIS — E1143 Type 2 diabetes mellitus with diabetic autonomic (poly)neuropathy: Secondary | ICD-10-CM | POA: Insufficient documentation

## 2016-06-24 DIAGNOSIS — Z538 Procedure and treatment not carried out for other reasons: Secondary | ICD-10-CM | POA: Insufficient documentation

## 2016-06-24 DIAGNOSIS — Z880 Allergy status to penicillin: Secondary | ICD-10-CM | POA: Diagnosis not present

## 2016-06-24 DIAGNOSIS — K5730 Diverticulosis of large intestine without perforation or abscess without bleeding: Secondary | ICD-10-CM | POA: Insufficient documentation

## 2016-06-24 DIAGNOSIS — E785 Hyperlipidemia, unspecified: Secondary | ICD-10-CM | POA: Diagnosis not present

## 2016-06-24 DIAGNOSIS — N183 Chronic kidney disease, stage 3 (moderate): Secondary | ICD-10-CM | POA: Insufficient documentation

## 2016-06-24 DIAGNOSIS — E039 Hypothyroidism, unspecified: Secondary | ICD-10-CM | POA: Insufficient documentation

## 2016-06-24 DIAGNOSIS — F411 Generalized anxiety disorder: Secondary | ICD-10-CM | POA: Diagnosis not present

## 2016-06-24 DIAGNOSIS — E1122 Type 2 diabetes mellitus with diabetic chronic kidney disease: Secondary | ICD-10-CM | POA: Insufficient documentation

## 2016-06-24 DIAGNOSIS — K219 Gastro-esophageal reflux disease without esophagitis: Secondary | ICD-10-CM | POA: Insufficient documentation

## 2016-06-24 LAB — POCT I-STAT, CHEM 8
BUN: 36 mg/dL — ABNORMAL HIGH (ref 6–20)
CREATININE: 1.5 mg/dL — AB (ref 0.44–1.00)
Calcium, Ion: 1.23 mmol/L (ref 1.15–1.40)
Chloride: 109 mmol/L (ref 101–111)
GLUCOSE: 179 mg/dL — AB (ref 65–99)
HCT: 38 % (ref 36.0–46.0)
HEMOGLOBIN: 12.9 g/dL (ref 12.0–15.0)
Potassium: 6.7 mmol/L (ref 3.5–5.1)
Sodium: 137 mmol/L (ref 135–145)
TCO2: 21 mmol/L (ref 0–100)

## 2016-06-24 LAB — CBC WITH DIFFERENTIAL/PLATELET
Basophils Absolute: 0 10*3/uL (ref 0.0–0.1)
Basophils Relative: 0 %
EOS ABS: 0.2 10*3/uL (ref 0.0–0.7)
EOS PCT: 2 %
HCT: 34.8 % — ABNORMAL LOW (ref 36.0–46.0)
Hemoglobin: 11.6 g/dL — ABNORMAL LOW (ref 12.0–15.0)
LYMPHS ABS: 2.5 10*3/uL (ref 0.7–4.0)
LYMPHS PCT: 26 %
MCH: 29.4 pg (ref 26.0–34.0)
MCHC: 33.3 g/dL (ref 30.0–36.0)
MCV: 88.3 fL (ref 78.0–100.0)
MONO ABS: 0.5 10*3/uL (ref 0.1–1.0)
MONOS PCT: 6 %
Neutro Abs: 6.5 10*3/uL (ref 1.7–7.7)
Neutrophils Relative %: 66 %
PLATELETS: 217 10*3/uL (ref 150–400)
RBC: 3.94 MIL/uL (ref 3.87–5.11)
RDW: 13.9 % (ref 11.5–15.5)
WBC: 9.7 10*3/uL (ref 4.0–10.5)

## 2016-06-24 LAB — CREATININE, SERUM
CREATININE: 1.63 mg/dL — AB (ref 0.44–1.00)
GFR calc Af Amer: 36 mL/min — ABNORMAL LOW (ref >60)
GFR calc non Af Amer: 31 mL/min — ABNORMAL LOW (ref >60)

## 2016-06-24 LAB — GLUCOSE, CAPILLARY
GLUCOSE-CAPILLARY: 188 mg/dL — AB (ref 65–99)
Glucose-Capillary: 158 mg/dL — ABNORMAL HIGH (ref 65–99)

## 2016-06-24 LAB — BASIC METABOLIC PANEL
ANION GAP: 7 (ref 5–15)
BUN: 37 mg/dL — AB (ref 6–20)
CALCIUM: 9.2 mg/dL (ref 8.9–10.3)
CO2: 20 mmol/L — ABNORMAL LOW (ref 22–32)
CREATININE: 1.57 mg/dL — AB (ref 0.44–1.00)
Chloride: 108 mmol/L (ref 101–111)
GFR calc Af Amer: 37 mL/min — ABNORMAL LOW (ref >60)
GFR, EST NON AFRICAN AMERICAN: 32 mL/min — AB (ref >60)
GLUCOSE: 180 mg/dL — AB (ref 65–99)
Potassium: 6.5 mmol/L (ref 3.5–5.1)
Sodium: 135 mmol/L (ref 135–145)

## 2016-06-24 LAB — CBC
HCT: 33.8 % — ABNORMAL LOW (ref 36.0–46.0)
Hemoglobin: 11.1 g/dL — ABNORMAL LOW (ref 12.0–15.0)
MCH: 28.9 pg (ref 26.0–34.0)
MCHC: 32.8 g/dL (ref 30.0–36.0)
MCV: 88 fL (ref 78.0–100.0)
PLATELETS: 206 10*3/uL (ref 150–400)
RBC: 3.84 MIL/uL — AB (ref 3.87–5.11)
RDW: 14.1 % (ref 11.5–15.5)
WBC: 7.3 10*3/uL (ref 4.0–10.5)

## 2016-06-24 LAB — POTASSIUM: Potassium: 5.3 mmol/L — ABNORMAL HIGH (ref 3.5–5.1)

## 2016-06-24 LAB — CBG MONITORING, ED: Glucose-Capillary: 169 mg/dL — ABNORMAL HIGH (ref 65–99)

## 2016-06-24 LAB — MRSA PCR SCREENING: MRSA BY PCR: NEGATIVE

## 2016-06-24 SURGERY — LIGATION, INTERNAL FISTULA TRACT
Anesthesia: General

## 2016-06-24 MED ORDER — FUROSEMIDE 10 MG/ML IJ SOLN
40.0000 mg | INTRAMUSCULAR | Status: AC
  Administered 2016-06-24: 40 mg via INTRAVENOUS
  Filled 2016-06-24: qty 4

## 2016-06-24 MED ORDER — SODIUM CHLORIDE 0.9% FLUSH
3.0000 mL | Freq: Two times a day (BID) | INTRAVENOUS | Status: DC
  Administered 2016-06-24: 3 mL via INTRAVENOUS

## 2016-06-24 MED ORDER — DEXTROSE 50 % IV SOLN
25.0000 mL | Freq: Once | INTRAVENOUS | Status: AC
  Administered 2016-06-24: 25 mL via INTRAVENOUS
  Filled 2016-06-24: qty 50

## 2016-06-24 MED ORDER — LEVOTHYROXINE SODIUM 75 MCG PO TABS
75.0000 ug | ORAL_TABLET | Freq: Every day | ORAL | Status: DC
  Administered 2016-06-25: 75 ug via ORAL
  Filled 2016-06-24: qty 1

## 2016-06-24 MED ORDER — SODIUM POLYSTYRENE SULFONATE 15 GM/60ML PO SUSP
45.0000 g | Freq: Once | ORAL | Status: AC
  Administered 2016-06-24: 45 g via ORAL
  Filled 2016-06-24: qty 180

## 2016-06-24 MED ORDER — SODIUM CHLORIDE 0.9 % IV BOLUS (SEPSIS)
500.0000 mL | Freq: Once | INTRAVENOUS | Status: AC
Start: 2016-06-24 — End: 2016-06-24
  Administered 2016-06-24: 500 mL via INTRAVENOUS

## 2016-06-24 MED ORDER — INSULIN ASPART 100 UNIT/ML IV SOLN
10.0000 [IU] | Freq: Once | INTRAVENOUS | Status: AC
  Administered 2016-06-24: 10 [IU] via INTRAVENOUS
  Filled 2016-06-24: qty 0.1

## 2016-06-24 MED ORDER — FEBUXOSTAT 40 MG PO TABS
40.0000 mg | ORAL_TABLET | Freq: Every day | ORAL | Status: DC
Start: 2016-06-24 — End: 2016-06-25
  Administered 2016-06-24 – 2016-06-25 (×2): 40 mg via ORAL
  Filled 2016-06-24 (×2): qty 1

## 2016-06-24 MED ORDER — AMLODIPINE BESYLATE 5 MG PO TABS
5.0000 mg | ORAL_TABLET | Freq: Every day | ORAL | Status: DC
  Administered 2016-06-25: 5 mg via ORAL
  Filled 2016-06-24: qty 1

## 2016-06-24 MED ORDER — INSULIN ASPART 100 UNIT/ML ~~LOC~~ SOLN
0.0000 [IU] | Freq: Three times a day (TID) | SUBCUTANEOUS | Status: DC
  Administered 2016-06-24 (×2): 2 [IU] via SUBCUTANEOUS
  Administered 2016-06-25: 1 [IU] via SUBCUTANEOUS
  Administered 2016-06-25: 3 [IU] via SUBCUTANEOUS
  Filled 2016-06-24: qty 1

## 2016-06-24 MED ORDER — ALLOPURINOL 100 MG PO TABS
100.0000 mg | ORAL_TABLET | Freq: Every day | ORAL | Status: DC
  Administered 2016-06-24 – 2016-06-25 (×2): 100 mg via ORAL
  Filled 2016-06-24 (×2): qty 1

## 2016-06-24 MED ORDER — HEPARIN SODIUM (PORCINE) 5000 UNIT/ML IJ SOLN
5000.0000 [IU] | Freq: Three times a day (TID) | INTRAMUSCULAR | Status: DC
  Administered 2016-06-24 – 2016-06-25 (×2): 5000 [IU] via SUBCUTANEOUS
  Filled 2016-06-24 (×2): qty 1

## 2016-06-24 MED ORDER — ATORVASTATIN CALCIUM 40 MG PO TABS
40.0000 mg | ORAL_TABLET | Freq: Every evening | ORAL | Status: DC
  Administered 2016-06-24: 40 mg via ORAL
  Filled 2016-06-24: qty 1

## 2016-06-24 MED ORDER — SODIUM POLYSTYRENE SULFONATE 15 GM/60ML PO SUSP
30.0000 g | Freq: Once | ORAL | Status: AC
  Administered 2016-06-24: 30 g via ORAL
  Filled 2016-06-24: qty 120

## 2016-06-24 MED ORDER — LACTATED RINGERS IV SOLN
INTRAVENOUS | Status: DC
  Administered 2016-06-24: 10:00:00 via INTRAVENOUS
  Filled 2016-06-24: qty 1000

## 2016-06-24 SURGICAL SUPPLY — 56 items
BENZOIN TINCTURE PRP APPL 2/3 (GAUZE/BANDAGES/DRESSINGS) ×6 IMPLANT
BLADE HEX COATED 2.75 (ELECTRODE) ×3 IMPLANT
BLADE SURG 10 STRL SS (BLADE) IMPLANT
BLADE SURG 15 STRL LF DISP TIS (BLADE) ×1 IMPLANT
BLADE SURG 15 STRL SS (BLADE) ×2
BRIEF STRETCH FOR OB PAD LRG (UNDERPADS AND DIAPERS) ×6 IMPLANT
CANISTER SUCTION 2500CC (MISCELLANEOUS) ×3 IMPLANT
COVER BACK TABLE 60X90IN (DRAPES) ×3 IMPLANT
COVER MAYO STAND STRL (DRAPES) ×3 IMPLANT
DECANTER SPIKE VIAL GLASS SM (MISCELLANEOUS) ×3 IMPLANT
DRAPE LAPAROTOMY 100X72 PEDS (DRAPES) ×3 IMPLANT
DRAPE UTILITY XL STRL (DRAPES) ×3 IMPLANT
ELECT BLADE 6.5 .24CM SHAFT (ELECTRODE) IMPLANT
ELECT REM PT RETURN 9FT ADLT (ELECTROSURGICAL) ×3
ELECTRODE REM PT RTRN 9FT ADLT (ELECTROSURGICAL) ×1 IMPLANT
GAUZE SPONGE 4X4 16PLY XRAY LF (GAUZE/BANDAGES/DRESSINGS) IMPLANT
GAUZE VASELINE 3X9 (GAUZE/BANDAGES/DRESSINGS) IMPLANT
GLOVE BIO SURGEON STRL SZ 6.5 (GLOVE) ×2 IMPLANT
GLOVE BIO SURGEONS STRL SZ 6.5 (GLOVE) ×1
GLOVE INDICATOR 7.0 STRL GRN (GLOVE) ×3 IMPLANT
GOWN STRL REUS W/ TWL LRG LVL3 (GOWN DISPOSABLE) ×1 IMPLANT
GOWN STRL REUS W/ TWL XL LVL3 (GOWN DISPOSABLE) ×2 IMPLANT
GOWN STRL REUS W/TWL LRG LVL3 (GOWN DISPOSABLE) ×2
GOWN STRL REUS W/TWL XL LVL3 (GOWN DISPOSABLE) ×4
HYDROGEN PEROXIDE 16OZ (MISCELLANEOUS) ×3 IMPLANT
IV CATH 18G SAFETY (IV SOLUTION) ×3 IMPLANT
KIT ROOM TURNOVER WOR (KITS) ×3 IMPLANT
LOOP VESSEL MAXI BLUE (MISCELLANEOUS) IMPLANT
MANIFOLD NEPTUNE II (INSTRUMENTS) IMPLANT
NEEDLE HYPO 25X1 1.5 SAFETY (NEEDLE) ×3 IMPLANT
NS IRRIG 500ML POUR BTL (IV SOLUTION) ×3 IMPLANT
PACK BASIN DAY SURGERY FS (CUSTOM PROCEDURE TRAY) ×3 IMPLANT
PAD ABD 8X10 STRL (GAUZE/BANDAGES/DRESSINGS) ×3 IMPLANT
PAD ARMBOARD 7.5X6 YLW CONV (MISCELLANEOUS) IMPLANT
PENCIL BUTTON HOLSTER BLD 10FT (ELECTRODE) ×3 IMPLANT
RETRACTOR STAY HOOK 5MM (MISCELLANEOUS) IMPLANT
RETRACTOR STERILE 25.8CMX11.3 (INSTRUMENTS) ×3 IMPLANT
SPONGE GAUZE 4X4 12PLY STER LF (GAUZE/BANDAGES/DRESSINGS) ×3 IMPLANT
SPONGE SURGIFOAM ABS GEL 12-7 (HEMOSTASIS) IMPLANT
SUCTION FRAZIER HANDLE 10FR (MISCELLANEOUS) ×2
SUCTION TUBE FRAZIER 10FR DISP (MISCELLANEOUS) ×1 IMPLANT
SUT CHROMIC 2 0 SH (SUTURE) IMPLANT
SUT CHROMIC 3 0 SH 27 (SUTURE) IMPLANT
SUT ETHIBOND 0 (SUTURE) IMPLANT
SUT SILK 2 0 (SUTURE)
SUT SILK 2-0 18XBRD TIE 12 (SUTURE) IMPLANT
SUT SILK 3 0 SH 30 (SUTURE) IMPLANT
SUT VIC AB 3-0 SH 8-18 (SUTURE) ×3 IMPLANT
SUT VICRYL 3 0 UR 6 27 (SUTURE) IMPLANT
SYR BULB IRRIGATION 50ML (SYRINGE) ×3 IMPLANT
SYR CONTROL 10ML LL (SYRINGE) ×3 IMPLANT
TOWEL OR 17X24 6PK STRL BLUE (TOWEL DISPOSABLE) ×3 IMPLANT
TRAY DSU PREP LF (CUSTOM PROCEDURE TRAY) ×3 IMPLANT
TUBE CONNECTING 12'X1/4 (SUCTIONS) ×1
TUBE CONNECTING 12X1/4 (SUCTIONS) ×2 IMPLANT
YANKAUER SUCT BULB TIP NO VENT (SUCTIONS) ×3 IMPLANT

## 2016-06-24 NOTE — Progress Notes (Signed)
New onset hyperkalemia of unknown etiology. Validated by [employer] x2 and serum level of 6.5 EKG shows no peaked T waves. Surgery cancelled. Transfer to ER.

## 2016-06-24 NOTE — Progress Notes (Signed)
Cm updated Tim of [facility] that pt is being admitted for hyperkalemia for pt to be followed for d/c needs  Pt had  RN, PT, Social Work since 2017

## 2016-06-24 NOTE — H&P (Addendum)
TRH H&P   Patient Demographics:    [name], is a 71 y.o. female  MRN: [id number]   DOB - [birth year]  Admit Date - 06/24/2016  Outpatient Primary MD for the patient is Eustaquio Maize, MD  Referring MD/NP/PA: Dr Rex Kras  Patient coming from: home  Chief Complaint  Patient presents with  . Hyperkalemia      HPI:    [name]  is a 71 y.o. female, With history of gout, diabetes mellitus, gout, hyperlipidemia, hypothyroidism, hypertension, noticed to have hyperkalemia on preoperative lab for planned sutures/seton  removal from anal fistula as wound has healed around the fistula, potassium was noticed to be 6.5, she denies any complaints, no chest pain, no shortness of breath, no nausea, no vomiting, no abdominal pain fever or chills, EKG with no acute findings, NAD patient received IV D50 with IV insulin, one dose of IV Lasix, patient reports she has been eating diet rich with potassium -containing food, I was called to admit patient for observation overnight and repeat labs in a.m.Marland Kitchen    Review of systems:    In addition to the HPI above,  No Fever-chills, No Headache, No changes with Vision or hearing, No problems swallowing food or Liquids, No Chest pain, Cough or Shortness of Breath, No Abdominal pain, No Nausea or Vommitting, Bowel movements are regular, No Blood in stool or Urine, No dysuria, No new skin rashes or bruises, No new joints pains-aches,  No new weakness, tingling, numbness in any extremity, No recent weight gain or loss, No polyuria, polydypsia or polyphagia, No significant Mental Stressors.  A full 10 point Review of Systems was done, except as stated above, all other Review of Systems were negative.   With Past History of the following :    Past Medical History:  Diagnosis Date  . Anemia in chronic renal disease    Aranesp injection --  when  Hg <11, last injection 12-24-14.  Marland Kitchen Anxiety   . Arthritis    knees and hand/fingers. "broke back"-being evaluated for this"weakness left leg"  . CKD (chronic kidney disease), stage III    nephrologist--  dr Mercy Moore  . Complication of anesthesia    post-op confusion   . Constipation   . Coronary artery disease   . Diabetic gastroparesis ([place])   . Diabetic retinopathy ([place])    bilateral --  monitored by dr [name]  . Diverticulosis of colon   . GAD (generalized anxiety disorder)   . GERD (gastroesophageal reflux disease)   . History of colon polyps    benign  . History of esophagitis   . History of GI bleed    upper 2009  due to esophagitis  &  2002  due to Mallory-Weiss tear  . History of gout    in issues for several years  . 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 History of rectal abscess    12-29-2004  bedside I & D  . Hyperlipidemia   . Hypertension   . Hypothyroidism   . LAFB (left anterior fascicular block)   . Right bundle branch block   . Sacral decubitus ulcer    since 2014- 01-02-15 remains with wound" gauze dressing changes daily.  . Type II diabetes mellitus ([place])       Past Surgical History:  Procedure Laterality Date  . APPLICATION OF A-CELL OF EXTREMITY N/A 04/07/2015   Procedure: A CELL PLACMENT;  Surgeon: Loel Lofty Dillingham, DO;  Location: [facility];  Service: Plastics;  Laterality: N/A;  . CARDIOVASCULAR STRESS TEST  12-30-2004   normal perfusion study/  no ischemia or infartion/  normal LV wall function and wall motion , ef 66%  . CATARACT EXTRACTION W/ INTRAOCULAR LENS  IMPLANT, BILATERAL  1995  . COLONOSCOPY W/ POLYPECTOMY  last one 2008  . COMPRESSION HIP SCREW Right 05/18/2014   Procedure: COMPRESSION HIP;  Surgeon: Carole Civil, MD;  Location: [facility];  Service: Orthopedics;  Laterality: Right;  . ESOPHAGOGASTRODUODENOSCOPY  last one 01-09-2011  . EVALUATION UNDER ANESTHESIA WITH FISTULECTOMY N/A 01/06/2015   Procedure: EXAM  UNDER ANESTHESIA , placement of seton;  Surgeon: Jackolyn Confer, MD;  Location: [facility];  Service: General;  Laterality: N/A;  . I&D EXTREMITY N/A 04/07/2015   Procedure: IRRIGATION AND DEBRIDEMENT ISCHIAL ULCER;  Surgeon: Loel Lofty Dillingham, DO;  Location: [name];  Service: Plastics;  Laterality: N/A;  . INCISION AND DRAINAGE OF WOUND N/A 09/30/2014   Procedure: IRRIGATION AND DEBRIDEMENT SACRAL WOUND, EXCISION OF PERIRECTAL TRACT WITH PLACEMENT OF ACCELL;  Surgeon: Theodoro Kos, DO;  Location: [place];  Service: Plastics;  Laterality: N/A;  . ORIF FEMUR FRACTURE Left 10/09/2012   Procedure: OPEN REDUCTION INTERNAL FIXATION (ORIF) DISTAL FEMUR FRACTURE;  Surgeon: Rozanna Box, MD;  Location: [facility];  Service: Orthopedics;  Laterality: Left;  . RETINOPATHY SURGERY Bilateral 1980's?  . TRANSTHORACIC ECHOCARDIOGRAM  02-18-2011   mild LVH,  ef 55-60%,  grade I diastolic dysfunction/  mild TR/  RV systolic pressure increased consistant with moderate pulmonary hypertension      Social History:     Social History  Substance Use Topics  . Smoking status: Never Smoker  . Smokeless tobacco: Never Used  . Alcohol use No     Lives - At home  Mobility - independent     Family History :     Family History  Problem Relation Age of Onset  . Colon cancer Father   . Prostate cancer Father   . Diabetes Father   . Coronary artery disease Mother 88  . Heart disease Mother   . Diabetes Mother   . Coronary artery disease Sister 36  . Diabetes Sister   . Heart disease Sister   . Diabetes Sister   . Diabetes Sister   . Diabetes Sister   . Diabetes Sister   . Diabetes Sister   . Diabetes Maternal Grandmother       Home Medications:   Prior to Admission medications   Medication Sig Start Date End Date Taking? Authorizing Provider  allopurinol (ZYLOPRIM) 100 MG tablet Take 1 tablet (100 mg total) by mouth daily. 05/19/16  Yes Eustaquio Maize, MD  amLODipine (NORVASC)  5 MG tablet Take 1 tablet (5 mg total) by mouth daily. 06/02/16  Yes Eustaquio Maize, MD  atorvastatin (LIPITOR) 40 MG tablet Take 1  tablet (40 mg total) by mouth every evening. 03/05/16  Yes Eustaquio Maize, MD  Blood Glucose Monitoring Suppl (ACCU-CHEK AVIVA PLUS) w/Device KIT Use as directed 4 times daily 07/21/15  Yes Cassandria Anger, MD  Colchicine 0.6 MG CAPS Take by mouth daily.   Yes Historical Provider, MD  febuxostat (ULORIC) 40 MG tablet Take 40 mg by mouth daily.   Yes Historical Provider, MD  glucose blood (ACCU-CHEK AVIVA) test strip Use to test glucose 3 times a day 06/21/16  Yes Cassandria Anger, MD  glucose blood (ACCU-CHEK AVIVA) test strip Use as instructed 4 x daily. E11.65 06/22/16  Yes Cassandria Anger, MD  insulin aspart protamine - aspart (NOVOLOG MIX 70/30 FLEXPEN) (70-30) 100 UNIT/ML FlexPen Inject 0.08 mLs (8 Units total) into the skin daily with breakfast. Only if blood glucose is above 90 09/11/15  Yes Cassandria Anger, MD  Insulin Pen Needle (UNIFINE PENTIPS) 32G X 4 MM MISC Use as directed bid. E11.65 04/19/16  Yes Cassandria Anger, MD  Lancets (ACCU-CHEK SOFT TOUCH) lancets Use as instructed 4 x daily, E11.65 04/19/16  Yes Cassandria Anger, MD  levothyroxine (SYNTHROID, LEVOTHROID) 75 MCG tablet Take 1 tablet (75 mcg total) by mouth daily before breakfast. 03/05/16  Yes Eustaquio Maize, MD  UNIFINE PENTIPS 32G X 4 MM MISC  05/14/15  Yes Historical Provider, MD  metoprolol succinate (TOPROL-XL) 50 MG 24 hr tablet Take 1 tablet (50 mg total) by mouth every morning. Patient not taking: Reported on 06/24/2016 03/05/16   Eustaquio Maize, MD  pantoprazole (PROTONIX) 20 MG tablet Take 1 tablet (20 mg total) by mouth daily. Patient not taking: Reported on 06/24/2016 03/05/16   Eustaquio Maize, MD     Allergies:     Allergies  Allergen Reactions  . Aspirin Nausea And Vomiting  . Ciprofloxacin Other (See Comments)    Upset Stomach  . Codeine Nausea And  Vomiting    Makes me sick   . Micardis [Telmisartan] Other (See Comments)    unknown  . Nexium [Esomeprazole Magnesium] Other (See Comments)    Causes internal bleeding  . Niaspan [Niacin Er] Other (See Comments)    Reaction is unknown  . Onglyza [Saxagliptin] Other (See Comments)    Reaction is unknown  . Other     No otc pain medications  . Rofecoxib Other (See Comments)    unknown  . Simvastatin   . Tequin [Gatifloxacin] Other (See Comments)    Reaction is unknown  . Welchol [Colesevelam Hcl]   . Amoxicillin Nausea And Vomiting and Rash    Has patient had a PCN reaction causing immediate rash, facial/tongue/throat swelling, SOB or lightheadedness with hypotension: Yes Has patient had a PCN reaction causing severe rash involving mucus membranes or skin necrosis: no  Has patient had a PCN reaction that required hospitalization No Has patient had a PCN reaction occurring within the last 10 years: No If all of the above answers are "NO", then may proceed with Cephalosporin use.      Physical Exam:   Vitals  Blood pressure 155/94, pulse (!) 59, temperature 97.8 F (36.6 C), temperature source Oral, resp. rate 16, height '5\' 2"'  (1.575 m), weight 70.3 kg (155 lb), SpO2 99 %.   1. General Well-developed elderly female lying in bed in NAD,   2. Normal affect and insight, Not Suicidal or Homicidal, Awake Alert, Oriented X 3.  3. No F.N deficits, ALL C.Nerves Intact, Strength 5/5 all 4  extremities, Sensation intact all 4 extremities, Plantars down going.  4. Ears and Eyes appear Normal, Conjunctivae clear, PERRLA. Moist Oral Mucosa.  5. Supple Neck, No JVD, No cervical lymphadenopathy appriciated, No Carotid Bruits.  6. Symmetrical Chest wall movement, Good air movement bilaterally, CTAB.  7. RRR, No Gallops, Rubs or Murmurs, No Parasternal Heave.  8. Positive Bowel Sounds, Abdomen Soft, No tenderness, No organomegaly appriciated,No rebound -guarding or rigidity.  9.  No  Cyanosis, Normal Skin Turgor, No Skin Rash or Bruise.  10. Good muscle tone,  joints appear normal , no effusions, Normal ROM.  11. No Palpable Lymph Nodes in Neck or Axillae     Data Review:    CBC  Recent Labs Lab 06/24/16 0955  HGB 12.9  HCT 38.0   ------------------------------------------------------------------------------------------------------------------  Chemistries   Recent Labs Lab 06/24/16 0955 06/24/16 1005  NA 137 135  K 6.7* 6.5*  CL 109 108  CO2  --  20*  GLUCOSE 179* 180*  BUN 36* 37*  CREATININE 1.50* 1.57*  CALCIUM  --  9.2   ------------------------------------------------------------------------------------------------------------------ estimated creatinine clearance is 30.2 mL/min (by C-G formula based on SCr of 1.57 mg/dL (H)). ------------------------------------------------------------------------------------------------------------------ No results for input(s): TSH, T4TOTAL, T3FREE, THYROIDAB in the last 72 hours.  Invalid input(s): FREET3  Coagulation profile No results for input(s): INR, PROTIME in the last 168 hours. ------------------------------------------------------------------------------------------------------------------- No results for input(s): DDIMER in the last 72 hours. -------------------------------------------------------------------------------------------------------------------  Cardiac Enzymes No results for input(s): CKMB, TROPONINI, MYOGLOBIN in the last 168 hours.  Invalid input(s): CK ------------------------------------------------------------------------------------------------------------------ No results found for: BNP   ---------------------------------------------------------------------------------------------------------------  Urinalysis    Component Value Date/Time   COLORURINE YELLOW 06/28/2015 2006   APPEARANCEUR HAZY (A) 06/28/2015 2006   LABSPEC 1.020 06/28/2015 2006   PHURINE 5.5  06/28/2015 2006   GLUCOSEU NEGATIVE 06/28/2015 2006   HGBUR TRACE (A) 06/28/2015 2006   BILIRUBINUR NEGATIVE 06/28/2015 2006   KETONESUR NEGATIVE 06/28/2015 2006   PROTEINUR NEGATIVE 06/28/2015 2006   UROBILINOGEN 0.2 02/26/2015 1302   NITRITE NEGATIVE 06/28/2015 2006   LEUKOCYTESUR TRACE (A) 06/28/2015 2006    ----------------------------------------------------------------------------------------------------------------   Imaging Results:    No results found.  My personal review of EKG: Rhythm NSR, Rate  62/min, QTc 422 , old RBBB and LFABno Acute ST changes   Assessment & Plan:    Active Problems:   Hyperkalemia   Hyperkalemia - Patient presents with potassium of 6.5, as part of preoperative labs today, no EKG changes, she reports a diet rich with potassium-containing foods including potatoes and oranges, she was counseled about diet. - We'll give Kayexalate, received IV Lasix, IV insulin and D50 in ED. - No EKG changes, will monitor on telemetry overnight.  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  Code Status Full  Likely DC to home  Condition GUARDED    Consults called: none  Admission status: observation  Time spent in minutes : 55 minutes   Mete Purdum M.D on 06/24/2016 at 2:07 PM  Between 7am  to 7pm - Pager - [phone]. After 7pm go to [email or web address] - password [facility]  [facility] - Office  [phone]

## 2016-06-24 NOTE — ED Notes (Signed)
Bed: TA68 Expected date:  Expected time:  Means of arrival:  Comments: hyperkalemia

## 2016-06-24 NOTE — ED Triage Notes (Signed)
Pt initially scheduled to have fistula surgery today. Surgery cancelled due to pts serum potassium being >8.5 at 0945 and 6.7 at 0955. Pt arrives A+OX4, denying pain.

## 2016-06-24 NOTE — Care Management (Signed)
Record reviewed due to pending CM consult. Presenter, broadcasting BSN CCM

## 2016-06-24 NOTE — ED Notes (Signed)
Attempted report. Floor RN will call back.

## 2016-06-24 NOTE — H&P (Addendum)
[name] is an 71 y.o. female.   Chief Complaint: anal fistula HPI: 71 y.o. F with chronic wound being treated by the wound clinic.  A fistula was noted and a seton was placed through this.  The wound has since healed around the fistula.    Past Medical History:  Diagnosis Date  . Anemia in chronic renal disease    Aranesp injection --  when Hg <11, last injection 12-24-14.  Marland Kitchen Anxiety   . Arthritis    knees and hand/fingers. "broke back"-being evaluated for this"weakness left leg"  . CKD (chronic kidney disease), stage III    nephrologist--  dr Mercy Moore  . Complication of anesthesia    post-op confusion   . Constipation   . Coronary artery disease   . Diabetic gastroparesis ([place])   . Diabetic retinopathy ([place])    bilateral --  monitored by dr [name]  . Diverticulosis of colon   . GAD (generalized anxiety disorder)   . GERD (gastroesophageal reflux disease)   . History of colon polyps    benign  . History of esophagitis   . History of GI bleed    upper 2009  due to esophagitis  &  2002  due to Mallory-Weiss tear  . History of gout    in issues for several years  . 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 History of rectal abscess    12-29-2004  bedside I & D  . Hyperlipidemia   . Hypertension   . Hypothyroidism   . LAFB (left anterior fascicular block)   . Right bundle branch block   . Sacral decubitus ulcer    since 2014- 01-02-15 remains with wound" gauze dressing changes daily.  . Type II diabetes mellitus ([place])     Past Surgical History:  Procedure Laterality Date  . APPLICATION OF A-CELL OF EXTREMITY N/A 04/07/2015   Procedure: A CELL PLACMENT;  Surgeon: Loel Lofty Dillingham, DO;  Location: [place];  Service: Plastics;  Laterality: N/A;  . CARDIOVASCULAR STRESS TEST  12-30-2004   normal perfusion study/  no ischemia or infartion/  normal LV wall function and wall motion , ef 66%  . CATARACT EXTRACTION W/ INTRAOCULAR LENS  IMPLANT, BILATERAL   1995  . COLONOSCOPY W/ POLYPECTOMY  last one 2008  . COMPRESSION HIP SCREW Right 05/18/2014   Procedure: COMPRESSION HIP;  Surgeon: Carole Civil, MD;  Location: [facility];  Service: Orthopedics;  Laterality: Right;  . ESOPHAGOGASTRODUODENOSCOPY  last one 01-09-2011  . EVALUATION UNDER ANESTHESIA WITH FISTULECTOMY N/A 01/06/2015   Procedure: EXAM UNDER ANESTHESIA , placement of seton;  Surgeon: Jackolyn Confer, MD;  [facility];  Service: General;  Laterality: N/A;  . I&D EXTREMITY N/A 04/07/2015   Procedure: IRRIGATION AND DEBRIDEMENT ISCHIAL ULCER;  Surgeon: Loel Lofty Dillingham, DO;  Location: [place];  Service: Plastics;  Laterality: N/A;  . INCISION AND DRAINAGE OF WOUND N/A 09/30/2014   Procedure: IRRIGATION AND DEBRIDEMENT SACRAL WOUND, EXCISION OF PERIRECTAL TRACT WITH PLACEMENT OF ACCELL;  Surgeon: Theodoro Kos, DO;  Location: [place];  Service: Plastics;  Laterality: N/A;  . ORIF FEMUR FRACTURE Left 10/09/2012   Procedure: OPEN REDUCTION INTERNAL FIXATION (ORIF) DISTAL FEMUR FRACTURE;  Surgeon: Rozanna Box, MD;  Location: [place];  Service: Orthopedics;  Laterality: Left;  . RETINOPATHY SURGERY Bilateral 1980's?  . TRANSTHORACIC ECHOCARDIOGRAM  02-18-2011   mild LVH,  ef 55-60%,  grade I diastolic dysfunction/  mild  TR/  RV systolic pressure increased consistant with moderate pulmonary hypertension    Family History  Problem Relation Age of Onset  . Colon cancer Father   . Prostate cancer Father   . Diabetes Father   . Coronary artery disease Mother 33  . Heart disease Mother   . Diabetes Mother   . Coronary artery disease Sister 54  . Diabetes Sister   . Heart disease Sister   . Diabetes Sister   . Diabetes Sister   . Diabetes Sister   . Diabetes Sister   . Diabetes Sister   . Diabetes Maternal Grandmother    Social History:  reports that she has never smoked. She has never used smokeless tobacco. She reports that she does not drink alcohol or use  drugs.  Allergies:  Allergies  Allergen Reactions  . Aspirin Nausea And Vomiting  . Ciprofloxacin Other (See Comments)    Upset Stomach  . Codeine Nausea And Vomiting    Makes me sick   . Micardis [Telmisartan] Other (See Comments)    unknown  . Nexium [Esomeprazole Magnesium] Other (See Comments)    Causes internal bleeding  . Niaspan [Niacin Er] Other (See Comments)    Reaction is unknown  . Onglyza [Saxagliptin] Other (See Comments)    Reaction is unknown  . Other     No otc pain medications  . Rofecoxib Other (See Comments)    unknown  . Simvastatin   . Tequin [Gatifloxacin] Other (See Comments)    Reaction is unknown  . Welchol [Colesevelam Hcl]   . Amoxicillin Rash    Medications Prior to Admission  Medication Sig Dispense Refill  . allopurinol (ZYLOPRIM) 100 MG tablet Take 1 tablet (100 mg total) by mouth daily. 30 tablet 0  . amLODipine (NORVASC) 5 MG tablet Take 1 tablet (5 mg total) by mouth daily. 90 tablet 1  . atorvastatin (LIPITOR) 40 MG tablet Take 1 tablet (40 mg total) by mouth every evening. 90 tablet 1  . Blood Glucose Monitoring Suppl (ACCU-CHEK AVIVA PLUS) w/Device KIT Use as directed 4 times daily 1 kit 0  . Colchicine 0.6 MG CAPS Take by mouth daily.    . febuxostat (ULORIC) 40 MG tablet Take 40 mg by mouth daily.    Marland Kitchen glucose blood (ACCU-CHEK AVIVA) test strip Use to test glucose 3 times a day 150 each 3  . glucose blood (ACCU-CHEK AVIVA) test strip Use as instructed 4 x daily. E11.65 150 each 5  . insulin aspart protamine - aspart (NOVOLOG MIX 70/30 FLEXPEN) (70-30) 100 UNIT/ML FlexPen Inject 0.08 mLs (8 Units total) into the skin daily with breakfast. Only if blood glucose is above 90 15 mL 2  . Insulin Pen Needle (UNIFINE PENTIPS) 32G X 4 MM MISC Use as directed bid. E11.65 100 each 5  . Lancets (ACCU-CHEK SOFT TOUCH) lancets Use as instructed 4 x daily, E11.65 150 each 5  . levothyroxine (SYNTHROID, LEVOTHROID) 75 MCG tablet Take 1 tablet (75 mcg  total) by mouth daily before breakfast. 90 tablet 1  . metoprolol succinate (TOPROL-XL) 50 MG 24 hr tablet Take 1 tablet (50 mg total) by mouth every morning. 90 tablet 1  . pantoprazole (PROTONIX) 20 MG tablet Take 1 tablet (20 mg total) by mouth daily. 90 tablet 1  . UNIFINE PENTIPS 32G X 4 MM MISC       No results found for this or any previous visit (from the past 48 hour(s)). No results found.  Review of  Systems  Constitutional: Negative for chills and fever.  HENT: Negative for congestion and hearing loss.   Eyes: Negative for blurred vision and double vision.  Respiratory: Negative for cough and shortness of breath.   Cardiovascular: Negative for chest pain and palpitations.  Gastrointestinal: Negative for abdominal pain, nausea and vomiting.  Genitourinary: Negative for dysuria, frequency and urgency.  Musculoskeletal: Negative for myalgias.  Skin: Negative for itching and rash.  Neurological: Negative for dizziness.    Blood pressure (!) 130/59, pulse (!) 107, temperature 97.9 F (36.6 C), temperature source Oral, resp. rate 18, height 5' 2" (1.575 m), weight 70.3 kg (155 lb), SpO2 100 %. Physical Exam  Constitutional: She is oriented to person, place, and time. She appears well-developed and well-nourished.  HENT:  Head: Normocephalic and atraumatic.  Eyes: Conjunctivae and EOM are normal. Pupils are equal, round, and reactive to light.  Neck: Normal range of motion. Neck supple.  Cardiovascular: Normal rate and regular rhythm.   Respiratory: Effort normal and breath sounds normal.  GI: Soft. Bowel sounds are normal. She exhibits no distension.  Musculoskeletal: Normal range of motion.  Neurological: She is alert and oriented to person, place, and time.  Skin: Skin is warm and dry.     Assessment/Plan Pt was noted to be hyperkalemic.  Cr elevated.  Unfortunately, we will have to cancel her surgery.  We will send her to the ED to be evaluated further.  Rosario Adie., MD 09/28/2618, 9:13 AM

## 2016-06-24 NOTE — ED Notes (Signed)
Ordered pt food tray.

## 2016-06-24 NOTE — Anesthesia Preprocedure Evaluation (Signed)
Anesthesia Evaluation    Airway        Dental   Pulmonary           Cardiovascular hypertension,      Neuro/Psych    GI/Hepatic   Endo/Other  diabetes  Renal/GU      Musculoskeletal   Abdominal   Peds  Hematology   Anesthesia Other Findings New onset hyperkalemia. Validated by [employer] x2 and serum level of 6.5 EKG shows no peaked T waves. Surgery cancelled. Transfer to ER.  Reproductive/Obstetrics                             Anesthesia Physical Anesthesia Plan Anesthesia Quick Evaluation

## 2016-06-24 NOTE — Progress Notes (Signed)
Surgery was cancelled due to high K+ level. Checked level x 3 at 3 different sites. Per  Dr. Marcell Barlow.

## 2016-06-24 NOTE — ED Provider Notes (Signed)
[facility] DEPT Provider Note   CSN: [id number] Arrival date & time: 06/24/16 1130     History    Chief Complaint  Patient presents with  . Hyperkalemia     HPI [name] is a 71 y.o. female.  71yo F w/ extensive PMH including CAD, T2DM, gout, CKD, sacral decubitus ulcer who p/w hyperkalemia. The patient went in for a routinely scheduled removal of drain from her back wound and had preop labs which were notable for hyperkalemia. She was sent here for further evaluation. She does note that she has been eating a lot of sweets potatoes and bananas recently. She reports normal urination. No fevers, vomiting, or recent illness. She denies any complaints, specifically no chest pain or shortness of breath.  Past Medical History:  Diagnosis Date  . Anemia in chronic renal disease    Aranesp injection --  when Hg <11, last injection 12-24-14.  Marland Kitchen Anxiety   . Arthritis    knees and hand/fingers. "broke back"-being evaluated for this"weakness left leg"  . CKD (chronic kidney disease), stage III    nephrologist--  dr Mercy Moore  . Complication of anesthesia    post-op confusion   . Constipation   . Coronary artery disease   . Diabetic gastroparesis ([place])   . Diabetic retinopathy ([place])    bilateral --  monitored by dr [name]  . Diverticulosis of colon   . GAD (generalized anxiety disorder)   . GERD (gastroesophageal reflux disease)   . History of colon polyps    benign  . History of esophagitis   . History of GI bleed    upper 2009  due to esophagitis  &  2002  due to Mallory-Weiss tear  . History of gout    in issues for several years  . 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 History of rectal abscess    12-29-2004  bedside I & D  . Hyperlipidemia   . Hypertension   . Hypothyroidism   . LAFB (left anterior fascicular block)   . Right bundle branch block   . Sacral decubitus ulcer    since 2014- 01-02-15 remains with wound" gauze dressing changes  daily.  . Type II diabetes mellitus [facility])      Patient Active Problem List   Diagnosis Date Noted  . Foot ulcer ([place]) 06/02/2016  . 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 Cellulitis 05/05/2016  . Diabetic foot infection ([place]) 05/05/2016  . Hyperkalemia 06/28/2015  . Nausea and vomiting 01/23/2015  . Acute encephalopathy 01/23/2015  . CKD (chronic kidney disease), stage III   . Anemia in chronic renal disease   . Gastroesophageal reflux disease without esophagitis 10/11/2014  . Hypothyroidism 05/18/2014  . Hip fracture requiring operative repair ([place])   . Fall   . Osteoporosis 09/06/2013  . Type 2 diabetes mellitus with stage 4 chronic kidney disease ([place]) 07/20/2013  . Edema 03/03/2011  . Diabetic retinopathy ([place]) 10/28/2007  . Hyperlipidemia 10/28/2007  . Anxiety state 10/28/2007  . Depression 10/28/2007  . Essential hypertension 10/28/2007  . Disorder resulting from impaired renal function 10/28/2007  . COLONIC POLYPS, HYPERPLASTIC 11/17/2006  . DIVERTICULOSIS, COLON 11/17/2006    Past Surgical History:  Procedure Laterality Date  . APPLICATION OF A-CELL OF EXTREMITY N/A 04/07/2015   Procedure: A CELL PLACMENT;  Surgeon: Loel Lofty Dillingham, DO;  Location: [facility];  Service: Plastics;  Laterality: N/A;  . CARDIOVASCULAR STRESS  TEST  12-30-2004   normal perfusion study/  no ischemia or infartion/  normal LV wall function and wall motion , ef 66%  . CATARACT EXTRACTION W/ INTRAOCULAR LENS  IMPLANT, BILATERAL  1995  . COLONOSCOPY W/ POLYPECTOMY  last one 2008  . COMPRESSION HIP SCREW Right 05/18/2014   Procedure: COMPRESSION HIP;  Surgeon: Carole Civil, MD;  Location: [facility];  Service: Orthopedics;  Laterality: Right;  . ESOPHAGOGASTRODUODENOSCOPY  last one 01-09-2011  . EVALUATION UNDER ANESTHESIA WITH FISTULECTOMY N/A 01/06/2015   Procedure: EXAM UNDER ANESTHESIA , placement of seton;  Surgeon: Jackolyn Confer, MD;  Location: [facility];  Service: General;  Laterality: N/A;  . I&D EXTREMITY N/A 04/07/2015   Procedure: IRRIGATION AND DEBRIDEMENT ISCHIAL ULCER;  Surgeon: Loel Lofty Dillingham, DO;  Location: [facility];  Service: Plastics;  Laterality: N/A;  . INCISION AND DRAINAGE OF WOUND N/A 09/30/2014   Procedure: IRRIGATION AND DEBRIDEMENT SACRAL WOUND, EXCISION OF PERIRECTAL TRACT WITH PLACEMENT OF ACCELL;  Surgeon: Theodoro Kos, DO;  Location: [facility];  Service: Plastics;  Laterality: N/A;  . ORIF FEMUR FRACTURE Left 10/09/2012   Procedure: OPEN REDUCTION INTERNAL FIXATION (ORIF) DISTAL FEMUR FRACTURE;  Surgeon: Rozanna Box, MD;  Location: [facility];  Service: Orthopedics;  Laterality: Left;  . RETINOPATHY SURGERY Bilateral 1980's?  . TRANSTHORACIC ECHOCARDIOGRAM  02-18-2011   mild LVH,  ef 55-60%,  grade I diastolic dysfunction/  mild TR/  RV systolic pressure increased consistant with moderate pulmonary hypertension    OB History    Gravida Para Term Preterm AB Living             0   SAB TAB Ectopic Multiple Live Births                    Home Medications    Prior to Admission medications   Medication Sig Start Date End Date Taking? Authorizing Provider  allopurinol (ZYLOPRIM) 100 MG tablet Take 1 tablet (100 mg total) by mouth daily. 05/19/16  Yes Eustaquio Maize, MD  amLODipine (NORVASC) 5 MG tablet Take 1 tablet (5 mg total) by mouth daily. 06/02/16  Yes Eustaquio Maize, MD  atorvastatin (LIPITOR) 40 MG tablet Take 1 tablet (40 mg total) by mouth every evening. 03/05/16  Yes Eustaquio Maize, MD  Blood Glucose Monitoring Suppl (ACCU-CHEK AVIVA PLUS) w/Device KIT Use as directed 4 times daily 07/21/15  Yes Cassandria Anger, MD  Colchicine 0.6 MG CAPS Take by mouth daily.   Yes Historical Provider, MD  febuxostat (ULORIC) 40 MG tablet Take 40 mg by mouth daily.   Yes Historical Provider, MD  glucose blood (ACCU-CHEK AVIVA) test strip Use to test glucose 3 times a day 06/21/16  Yes Cassandria Anger, MD  glucose blood (ACCU-CHEK AVIVA) test strip Use as instructed 4 x daily. E11.65 06/22/16  Yes Cassandria Anger, MD  insulin aspart protamine - aspart (NOVOLOG MIX 70/30 FLEXPEN) (70-30) 100 UNIT/ML FlexPen Inject 0.08 mLs (8 Units total) into the skin daily with breakfast. Only if blood glucose is above 90 09/11/15  Yes Cassandria Anger, MD  Insulin Pen Needle (UNIFINE PENTIPS) 32G X 4 MM MISC Use as directed bid. E11.65 04/19/16  Yes Cassandria Anger, MD  Lancets (ACCU-CHEK SOFT TOUCH) lancets Use as instructed 4 x daily, E11.65 04/19/16  Yes Cassandria Anger, MD  levothyroxine (SYNTHROID, LEVOTHROID) 75 MCG tablet Take 1 tablet (75 mcg total) by mouth daily before breakfast.  03/05/16  Yes Eustaquio Maize, MD  UNIFINE PENTIPS 32G X 4 MM MISC  05/14/15  Yes Historical Provider, MD  metoprolol succinate (TOPROL-XL) 50 MG 24 hr tablet Take 1 tablet (50 mg total) by mouth every morning. Patient not taking: Reported on 06/24/2016 03/05/16   Eustaquio Maize, MD  pantoprazole (PROTONIX) 20 MG tablet Take 1 tablet (20 mg total) by mouth daily. Patient not taking: Reported on 06/24/2016 03/05/16   Eustaquio Maize, MD      Family History  Problem Relation Age of Onset  . Colon cancer Father   . Prostate cancer Father   . Diabetes Father   . Coronary artery disease Mother 53  . Heart disease Mother   . Diabetes Mother   . Coronary artery disease Sister 79  . Diabetes Sister   . Heart disease Sister   . Diabetes Sister   . Diabetes Sister   . Diabetes Sister   . Diabetes Sister   . Diabetes Sister   . Diabetes Maternal Grandmother      Social History  Substance Use Topics  . Smoking status: Never Smoker  . Smokeless tobacco: Never Used  . Alcohol use No     Allergies     Aspirin; Ciprofloxacin; Codeine; Micardis [telmisartan]; Nexium [esomeprazole magnesium]; Niaspan [niacin er]; Onglyza [saxagliptin]; Other; Rofecoxib; Simvastatin; Tequin [gatifloxacin];  Welchol [colesevelam hcl]; and Amoxicillin    Review of Systems  10 Systems reviewed and are negative for acute change except as noted in the HPI.   Physical Exam Updated Vital Signs BP 171/84 (BP Location: Right Arm)   Pulse 64   Temp 97.8 F (36.6 C) (Oral)   Resp 18   Ht '5\' 2"'  (1.575 m)   Wt 155 lb (70.3 kg)   SpO2 96%   BMI 28.35 kg/m   Physical Exam  Constitutional: She is oriented to person, place, and time. She appears well-developed and well-nourished. No distress.  HENT:  Head: Normocephalic and atraumatic.  Moist mucous membranes  Eyes: Conjunctivae are normal. Pupils are equal, round, and reactive to light.  Neck: Neck supple.  Cardiovascular: Normal rate, regular rhythm and normal heart sounds.   No murmur heard. Pulmonary/Chest: Effort normal and breath sounds normal.  Abdominal: Soft. Bowel sounds are normal. She exhibits no distension. There is no tenderness.  Musculoskeletal: She exhibits no edema.  Neurological: She is alert and oriented to person, place, and time.  Fluent speech  Skin: Skin is warm and dry.  Psychiatric: She has a normal mood and affect. Judgment normal.  Nursing note and vitals reviewed.     ED Treatments / Results  Labs (all labs ordered are listed, but only abnormal results are displayed) Labs Reviewed  BASIC METABOLIC PANEL - Abnormal; Notable for the following:       Result Value   Potassium 6.5 (*)    CO2 20 (*)    Glucose, Bld 180 (*)    BUN 37 (*)    Creatinine, Ser 1.57 (*)    GFR calc non Af Amer 32 (*)    GFR calc Af Amer 37 (*)    All other components within normal limits  POCT I-STAT, CHEM 8 - Abnormal; Notable for the following:    Potassium 6.7 (*)    BUN 36 (*)    Creatinine, Ser 1.50 (*)    Glucose, Bld 179 (*)    All other components within normal limits  CBC WITH DIFFERENTIAL/PLATELET     EKG  EKG Interpretation  Date/Time:  Thursday June 24 2016 11:35:27 EST Ventricular Rate:  62 PR  Interval:    QRS Duration: 149 QT Interval:  415 QTC Calculation: 422 R Axis:   -69 Text Interpretation:  Sinus rhythm RBBB and LAFB Probable left ventricular hypertrophy no significant change from tracing in 2017 Confirmed by Elfego Giammarino MD, Wallis Vancott ([id number]) on 06/24/2016 11:57:19 AM         Radiology No results found.  Procedures Procedures (including critical care time) .Critical Care Performed by: Sharlett Iles Authorized by: Sharlett Iles   Critical care provider statement:    Critical care time (minutes):  30   Critical care time was exclusive of:  Separately billable procedures and treating other patients   Critical care was necessary to treat or prevent imminent or life-threatening deterioration of the following conditions:  Metabolic crisis   Critical care was time spent personally by me on the following activities:  Development of treatment plan with patient or surrogate, examination of patient, obtaining history from patient or surrogate, ordering and performing treatments and interventions, ordering and review of laboratory studies, re-evaluation of patient's condition and review of old charts    Medications Ordered in ED  Medications  insulin aspart (novoLOG) injection 10 Units (not administered)  furosemide (LASIX) injection 40 mg (not administered)  sodium chloride 0.9 % bolus 500 mL (500 mLs Intravenous New Bag/Given 06/24/16 1221)  dextrose 50 % solution 25 mL (25 mLs Intravenous Given 06/24/16 1221)     Initial Impression / Assessment and Plan / ED Course  I have reviewed the triage vital signs and the nursing notes.  Pertinent labs that were available during my care of the patient were reviewed by me and considered in my medical decision making (see chart for details).  Clinical Course as of Jun 24 1220  Thu Jun 24, 2016  1152 Potassium: (!!) 6.5 [MJ]  1152 Potassium: (!!) 6.5 [MJ]  1152 Potassium: (!!) 6.5 [MJ]    Clinical Course User  Index [MJ] Tanna Furry, MD    PT sent from pre-op for hyperkalemia on labs today. She was comfortable without complaints on exam. Stable vital signs. EKG looks similar to previous and has no evidence of acute hyperkalemic changes. Her repeat potassium here is elevated at 6.5. I gave insulin and glucose but given no EKG changes I held on giving calcium at this time. I did order a small fluid bolus and Lasix as the patient does urinate normally. Because of the significantly elevated potassium with potential for cardiac arrhythmia, discussed observation admission with hospitalist, Dr. Haynes Kerns, and pt admitted for Hyperkalemia treatment.  Final Clinical Impressions(s) / ED Diagnoses   Final diagnoses:  Hyperkalemia     New Prescriptions   No medications on file       Sharlett Iles, MD 06/24/16 1815

## 2016-06-25 ENCOUNTER — Other Ambulatory Visit: Payer: Self-pay

## 2016-06-25 DIAGNOSIS — E11649 Type 2 diabetes mellitus with hypoglycemia without coma: Secondary | ICD-10-CM | POA: Diagnosis not present

## 2016-06-25 DIAGNOSIS — N184 Chronic kidney disease, stage 4 (severe): Secondary | ICD-10-CM | POA: Diagnosis not present

## 2016-06-25 DIAGNOSIS — K604 Rectal fistula: Secondary | ICD-10-CM | POA: Diagnosis not present

## 2016-06-25 DIAGNOSIS — E1122 Type 2 diabetes mellitus with diabetic chronic kidney disease: Secondary | ICD-10-CM | POA: Diagnosis not present

## 2016-06-25 DIAGNOSIS — E1143 Type 2 diabetes mellitus with diabetic autonomic (poly)neuropathy: Secondary | ICD-10-CM | POA: Diagnosis not present

## 2016-06-25 DIAGNOSIS — E875 Hyperkalemia: Secondary | ICD-10-CM | POA: Diagnosis not present

## 2016-06-25 DIAGNOSIS — I130 Hypertensive heart and chronic kidney disease with heart failure and stage 1 through stage 4 chronic kidney disease, or unspecified chronic kidney disease: Secondary | ICD-10-CM | POA: Diagnosis not present

## 2016-06-25 LAB — BASIC METABOLIC PANEL
Anion gap: 10 (ref 5–15)
BUN: 31 mg/dL — AB (ref 6–20)
CHLORIDE: 105 mmol/L (ref 101–111)
CO2: 24 mmol/L (ref 22–32)
CREATININE: 1.55 mg/dL — AB (ref 0.44–1.00)
Calcium: 8.4 mg/dL — ABNORMAL LOW (ref 8.9–10.3)
GFR calc Af Amer: 38 mL/min — ABNORMAL LOW (ref >60)
GFR calc non Af Amer: 33 mL/min — ABNORMAL LOW (ref >60)
GLUCOSE: 143 mg/dL — AB (ref 65–99)
POTASSIUM: 3.6 mmol/L (ref 3.5–5.1)
Sodium: 139 mmol/L (ref 135–145)

## 2016-06-25 LAB — GLUCOSE, CAPILLARY
Glucose-Capillary: 149 mg/dL — ABNORMAL HIGH (ref 65–99)
Glucose-Capillary: 230 mg/dL — ABNORMAL HIGH (ref 65–99)

## 2016-06-25 LAB — CBC
HCT: 32.7 % — ABNORMAL LOW (ref 36.0–46.0)
HEMOGLOBIN: 10.8 g/dL — AB (ref 12.0–15.0)
MCH: 29.3 pg (ref 26.0–34.0)
MCHC: 33 g/dL (ref 30.0–36.0)
MCV: 88.6 fL (ref 78.0–100.0)
PLATELETS: 192 10*3/uL (ref 150–400)
RBC: 3.69 MIL/uL — AB (ref 3.87–5.11)
RDW: 14.1 % (ref 11.5–15.5)
WBC: 9.4 10*3/uL (ref 4.0–10.5)

## 2016-06-25 MED ORDER — GLUCOSE BLOOD VI STRP: ORAL_STRIP | 5 refills | Status: DC

## 2016-06-25 MED ORDER — AMLODIPINE BESYLATE 5 MG PO TABS
5.0000 mg | ORAL_TABLET | Freq: Once | ORAL | Status: AC
  Administered 2016-06-25: 5 mg via ORAL
  Filled 2016-06-25: qty 1

## 2016-06-25 MED ORDER — AMLODIPINE BESYLATE 5 MG PO TABS: 10.0000 mg | ORAL_TABLET | Freq: Once | ORAL | 0 refills | Status: DC

## 2016-06-25 NOTE — Discharge Summary (Addendum)
Physician Discharge Summary  [name] BTD:[id number] DOB: 09/15/1945 DOA: 06/24/2016  PCP: Eustaquio Maize, MD  Admit date: 06/24/2016 Discharge date: 06/25/2016  Admitted From: Home  Disposition:  Home   Recommendations for Outpatient Follow-up:  1. Follow up with PCP in 1-2 weeks 2. Please obtain BMP/CBC in one week 3. Follow with Dr Marcello Moores for planner sx.     Discharge Condition: Astable.  CODE STATUS: Full code.  Diet recommendation: Heart Healthy   Brief/Interim Summary: [name]  is a 71 y.o. female, With history of gout, diabetes mellitus, gout, hyperlipidemia, hypothyroidism, hypertension, noticed to have hyperkalemia on preoperative lab for planned sutures/seton  removal from anal fistula as wound has healed around the fistula, potassium was noticed to be 6.5, she denies any complaints, no chest pain, no shortness of breath, no nausea, no vomiting, no abdominal pain fever or chills, EKG with no acute findings, NAD patient received IV D50 with IV insulin, one dose of IV Lasix, patient reports she has been eating diet rich with potassium -containing food. Patient was admitted  for observation overnight.    Discharge Diagnoses:  Active Problems:   Hyperkalemia  1-Hyperkalemia;  Due to diet indiscretion.  Diet education provided.  Patient received Kayexalate, insulin, D 50 , lasix during admission.  K on presentation at 6. It has decreased to 3.7/   2-HTN; uncontrolled.  Will increase Norvasc.  Repeat blood pressure prior to discharge  Patient report that she has white coat HTN also. Advised to check BP at home, take higher dose of norvasc as needed.  Anal fistula; plan to follow up with Dr Marcello Moores for [facility] removal.   Continue with home medications for chronic problems.    Discharge Instructions  Discharge Instructions    Diet - low sodium heart healthy    Complete by:  As directed    Increase activity slowly    Complete by:  As directed      Allergies  as of 06/25/2016      Reactions   Aspirin Nausea And Vomiting   Ciprofloxacin Other (See Comments)   Upset Stomach   Codeine Nausea And Vomiting   Makes me sick    Micardis [telmisartan] Other (See Comments)   unknown   Nexium [esomeprazole Magnesium] Other (See Comments)   Causes internal bleeding   Niaspan [niacin Er] Other (See Comments)   Reaction is unknown   Onglyza [saxagliptin] Other (See Comments)   Reaction is unknown   Other    No otc pain medications   Rofecoxib Other (See Comments)   unknown   Simvastatin    Tequin [gatifloxacin] Other (See Comments)   Reaction is unknown   Welchol [colesevelam Hcl]    Amoxicillin Nausea And Vomiting, Rash   Has patient had a PCN reaction causing immediate rash, facial/tongue/throat swelling, SOB or lightheadedness with hypotension: Yes Has patient had a PCN reaction causing severe rash involving mucus membranes or skin necrosis: no Has patient had a PCN reaction that required hospitalization No Has patient had a PCN reaction occurring within the last 10 years: No If all of the above answers are "NO", then may proceed with Cephalosporin use.      Medication List    STOP taking these medications   metoprolol succinate 50 MG 24 hr tablet Commonly known as:  TOPROL-XL   pantoprazole 20 MG tablet Commonly known as:  PROTONIX     TAKE these medications   ACCU-CHEK AVIVA PLUS w/Device Kit Use as directed 4  times daily   accu-chek soft touch lancets Use as instructed 4 x daily, E11.65   allopurinol 100 MG tablet Commonly known as:  ZYLOPRIM Take 1 tablet (100 mg total) by mouth daily.   amLODipine 5 MG tablet Commonly known as:  NORVASC Take 2 tablets (10 mg total) by mouth once. What changed:  how much to take  when to take this   atorvastatin 40 MG tablet Commonly known as:  LIPITOR Take 1 tablet (40 mg total) by mouth every evening.   Colchicine 0.6 MG Caps Take by mouth daily.   febuxostat 40 MG  tablet Commonly known as:  ULORIC Take 40 mg by mouth daily.   glucose blood test strip Commonly known as:  ACCU-CHEK AVIVA Test 4 x daily. E11.65 What changed:  additional instructions   insulin aspart protamine - aspart (70-30) 100 UNIT/ML FlexPen Commonly known as:  NOVOLOG MIX 70/30 FLEXPEN Inject 0.08 mLs (8 Units total) into the skin daily with breakfast. Only if blood glucose is above 90   levothyroxine 75 MCG tablet Commonly known as:  SYNTHROID, LEVOTHROID Take 1 tablet (75 mcg total) by mouth daily before breakfast.   UNIFINE PENTIPS 32G X 4 MM Misc Generic drug:  Insulin Pen Needle   Insulin Pen Needle 32G X 4 MM Misc Commonly known as:  UNIFINE PENTIPS Use as directed bid. E11.65       Allergies  Allergen Reactions  . Aspirin Nausea And Vomiting  . Ciprofloxacin Other (See Comments)    Upset Stomach  . Codeine Nausea And Vomiting    Makes me sick   . Micardis [Telmisartan] Other (See Comments)    unknown  . Nexium [Esomeprazole Magnesium] Other (See Comments)    Causes internal bleeding  . Niaspan [Niacin Er] Other (See Comments)    Reaction is unknown  . Onglyza [Saxagliptin] Other (See Comments)    Reaction is unknown  . Other     No otc pain medications  . Rofecoxib Other (See Comments)    unknown  . Simvastatin   . Tequin [Gatifloxacin] Other (See Comments)    Reaction is unknown  . Welchol [Colesevelam Hcl]   . Amoxicillin Nausea And Vomiting and Rash    Has patient had a PCN reaction causing immediate rash, facial/tongue/throat swelling, SOB or lightheadedness with hypotension: Yes Has patient had a PCN reaction causing severe rash involving mucus membranes or skin necrosis: no  Has patient had a PCN reaction that required hospitalization No Has patient had a PCN reaction occurring within the last 10 years: No If all of the above answers are "NO", then may proceed with Cephalosporin use.      Consultations:  none   Procedures/Studies: No results found.    Subjective: Feeling well, she will change her diet.   Discharge Exam: Vitals:   06/25/16 0521 06/25/16 1032  BP: (!) 178/64 (!) 181/63  Pulse: 75   Resp: 18   Temp: 98.3 F (36.8 C)    Vitals:   06/24/16 1532 06/24/16 2206 06/25/16 0521 06/25/16 1032  BP: 97/76 (!) 144/80 (!) 178/64 (!) 181/63  Pulse: 66 67 75   Resp: '18 18 18   ' Temp: 98.1 F (36.7 C) 98.3 F (36.8 C) 98.3 F (36.8 C)   TempSrc: Oral Oral Oral   SpO2: 99% 97% 99%   Weight:      Height:        General: Pt is alert, awake, not in acute distress Cardiovascular: RRR, S1/S2 +, no  rubs, no gallops Respiratory: CTA bilaterally, no wheezing, no rhonchi Abdominal: Soft, NT, ND, bowel sounds + Extremities: no edema, no cyanosis    The results of significant diagnostics from this hospitalization (including imaging, microbiology, ancillary and laboratory) are listed below for reference.     Microbiology: Recent Results (from the past 240 hour(s))  MRSA PCR Screening     Status: None   Collection Time: 06/24/16  4:00 PM  Result Value Ref Range Status   MRSA by PCR NEGATIVE NEGATIVE Final    Comment:        The GeneXpert MRSA Assay (FDA approved for NASAL specimens only), is one component of a comprehensive MRSA colonization surveillance program. It is not intended to diagnose MRSA infection nor to guide or monitor treatment for MRSA infections.      Labs: BNP (last 3 results) No results for input(s): BNP in the last 8760 hours. Basic Metabolic Panel:  Recent Labs Lab 06/24/16 0955 06/24/16 1005 06/24/16 1500 06/24/16 1635 06/25/16 0518  NA 137 135  --   --  139  K 6.7* 6.5* 5.3*  --  3.6  CL 109 108  --   --  105  CO2  --  20*  --   --  24  GLUCOSE 179* 180*  --   --  143*  BUN 36* 37*  --   --  31*  CREATININE 1.50* 1.57*  --  1.63* 1.55*  CALCIUM  --  9.2  --   --  8.4*   Liver Function Tests: No results  for input(s): AST, ALT, ALKPHOS, BILITOT, PROT, ALBUMIN in the last 168 hours. No results for input(s): LIPASE, AMYLASE in the last 168 hours. No results for input(s): AMMONIA in the last 168 hours. CBC:  Recent Labs Lab 06/24/16 0955 06/24/16 1200 06/24/16 1635 06/25/16 0518  WBC  --  9.7 7.3 9.4  NEUTROABS  --  6.5  --   --   HGB 12.9 11.6* 11.1* 10.8*  HCT 38.0 34.8* 33.8* 32.7*  MCV  --  88.3 88.0 88.6  PLT  --  217 206 192   Cardiac Enzymes: No results for input(s): CKTOTAL, CKMB, CKMBINDEX, TROPONINI in the last 168 hours. BNP: Invalid input(s): POCBNP CBG:  Recent Labs Lab 06/24/16 1412 06/24/16 1659 06/24/16 2205 06/25/16 0743  GLUCAP 169* 158* 188* 149*   D-Dimer No results for input(s): DDIMER in the last 72 hours. Hgb A1c No results for input(s): HGBA1C in the last 72 hours. Lipid Profile No results for input(s): CHOL, HDL, LDLCALC, TRIG, CHOLHDL, LDLDIRECT in the last 72 hours. Thyroid function studies No results for input(s): TSH, T4TOTAL, T3FREE, THYROIDAB in the last 72 hours.  Invalid input(s): FREET3 Anemia work up No results for input(s): VITAMINB12, FOLATE, FERRITIN, TIBC, IRON, RETICCTPCT in the last 72 hours. Urinalysis    Component Value Date/Time   COLORURINE YELLOW 06/28/2015 2006   APPEARANCEUR HAZY (A) 06/28/2015 2006   LABSPEC 1.020 06/28/2015 2006   PHURINE 5.5 06/28/2015 2006   GLUCOSEU NEGATIVE 06/28/2015 2006   HGBUR TRACE (A) 06/28/2015 2006   BILIRUBINUR NEGATIVE 06/28/2015 2006   KETONESUR NEGATIVE 06/28/2015 2006   PROTEINUR NEGATIVE 06/28/2015 2006   UROBILINOGEN 0.2 02/26/2015 1302   NITRITE NEGATIVE 06/28/2015 2006   LEUKOCYTESUR TRACE (A) 06/28/2015 2006   Sepsis Labs Invalid input(s): PROCALCITONIN,  WBC,  LACTICIDVEN Microbiology Recent Results (from the past 240 hour(s))  MRSA PCR Screening     Status: None   Collection Time: 06/24/16  4:00 PM  Result Value Ref Range Status   MRSA by PCR NEGATIVE NEGATIVE  Final    Comment:        The GeneXpert MRSA Assay (FDA approved for NASAL specimens only), is one component of a comprehensive MRSA colonization surveillance program. It is not intended to diagnose MRSA infection nor to guide or monitor treatment for MRSA infections.      Time coordinating discharge: Over 30 minutes  SIGNED:   Elmarie Shiley, MD  [facility] 06/25/2016, 11:31 AM Pager   If 7PM-7AM, please contact night-coverage [email or web address] Password [email or web address]

## 2016-06-25 NOTE — Progress Notes (Signed)
Pt being discharged. Discharge instructions were reviewed with the pt. Pt BP was elevated at 185/68 after extra 5 mg dose of Norvasc. MD was made aware. Pt was given script for extra 5 mg dose of Norvasc when she goes home. RN informed pt to take the extra dose if she sees that her BP is elevated. Pt is aware to follow up with PCP. Currently waiting on ride. No questions or concerns at this time.  Chlora Mcbain W Vannia Pola, RN

## 2016-06-28 ENCOUNTER — Ambulatory Visit (INDEPENDENT_AMBULATORY_CARE_PROVIDER_SITE_OTHER): Payer: Medicare Other | Admitting: Pediatrics

## 2016-06-28 ENCOUNTER — Encounter: Payer: Self-pay | Admitting: Pediatrics

## 2016-06-28 VITALS — BP 121/61 | HR 65 | Temp 98.0°F | Ht 62.0 in | Wt 156.6 lb

## 2016-06-28 DIAGNOSIS — I10 Essential (primary) hypertension: Secondary | ICD-10-CM

## 2016-06-28 DIAGNOSIS — N183 Chronic kidney disease, stage 3 unspecified: Secondary | ICD-10-CM

## 2016-06-28 DIAGNOSIS — E875 Hyperkalemia: Secondary | ICD-10-CM

## 2016-06-28 DIAGNOSIS — M1A3791 Chronic gout due to renal impairment, unspecified ankle and foot, with tophus (tophi): Secondary | ICD-10-CM | POA: Diagnosis not present

## 2016-06-28 NOTE — Progress Notes (Signed)
  Subjective:   Patient ID: [name], female    DOB: 1946/05/03, 71 y.o.   MRN: [id number] CC: Hospitalization Follow-up  HPI: [name] is a 71 y.o. female presenting for Hospitalization Follow-up  HTN: BP at home 123-130s last week with [place] Takes meds regularly Always with elevated BP in office  Recent admission for hyperkalemia Required IVF Scheduled surgery was delayed for seton removal Eats a lot of bananas, sweet potatos at home Not taking potassium supplement Not on ACE-i/ARB Does have chronic kidney disease, Cr has been fairly stable 1.3-1.6 last few months  Seen by rheumatology for tophus gout Stopped allopurinol Plans to start uloric when insurance approval comes through  Relevant past medical, surgical, family and social history reviewed. Allergies and medications reviewed and updated. History  Smoking Status  . Never Smoker  Smokeless Tobacco  . Never Used   ROS: Per HPI   Objective:    BP 121/61   Pulse 65   Temp 98 F (36.7 C) (Oral)   Ht '5\' 2"'$  (1.575 m)   Wt 156 lb 9.6 oz (71 kg)   BMI 28.64 kg/m   Wt Readings from Last 3 Encounters:  06/28/16 156 lb 9.6 oz (71 kg)  06/24/16 155 lb (70.3 kg)  06/21/16 157 lb (71.2 kg)    Gen: NAD, alert, cooperative with exam, NCAT EYES: EOMI, no conjunctival injection, or no icterus CV: NRRR, normal S1/S2, no murmur, distal pulses 2+ b/l Resp: CTABL, no wheezes, normal WOB Abd: +BS, soft, NTND. no guarding or organomegaly Ext: No edema, warm Neuro: Alert and oriented MSK: normal muscle bulk  Assessment & Plan:  [name] was seen today for hospitalization follow-up.  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

## 2016-06-29 DIAGNOSIS — E11649 Type 2 diabetes mellitus with hypoglycemia without coma: Secondary | ICD-10-CM | POA: Diagnosis not present

## 2016-06-29 DIAGNOSIS — E1122 Type 2 diabetes mellitus with diabetic chronic kidney disease: Secondary | ICD-10-CM | POA: Diagnosis not present

## 2016-06-29 DIAGNOSIS — K604 Rectal fistula: Secondary | ICD-10-CM | POA: Diagnosis not present

## 2016-06-29 DIAGNOSIS — E1143 Type 2 diabetes mellitus with diabetic autonomic (poly)neuropathy: Secondary | ICD-10-CM | POA: Diagnosis not present

## 2016-06-29 DIAGNOSIS — I130 Hypertensive heart and chronic kidney disease with heart failure and stage 1 through stage 4 chronic kidney disease, or unspecified chronic kidney disease: Secondary | ICD-10-CM | POA: Diagnosis not present

## 2016-06-29 DIAGNOSIS — N184 Chronic kidney disease, stage 4 (severe): Secondary | ICD-10-CM | POA: Diagnosis not present

## 2016-06-29 LAB — BMP8+EGFR
BUN/Creatinine Ratio: 24 (ref 12–28)
BUN: 32 mg/dL — ABNORMAL HIGH (ref 8–27)
CO2: 21 mmol/L (ref 18–29)
Calcium: 9.2 mg/dL (ref 8.7–10.3)
Chloride: 100 mmol/L (ref 96–106)
Creatinine, Ser: 1.35 mg/dL — ABNORMAL HIGH (ref 0.57–1.00)
GFR calc Af Amer: 46 mL/min/{1.73_m2} — ABNORMAL LOW (ref >59)
GFR, EST NON AFRICAN AMERICAN: 40 mL/min/{1.73_m2} — AB (ref >59)
GLUCOSE: 149 mg/dL — AB (ref 65–99)
POTASSIUM: 4.4 mmol/L (ref 3.5–5.2)
SODIUM: 139 mmol/L (ref 134–144)

## 2016-07-01 ENCOUNTER — Encounter (HOSPITAL_BASED_OUTPATIENT_CLINIC_OR_DEPARTMENT_OTHER): Payer: Medicare Other | Attending: Internal Medicine

## 2016-07-01 DIAGNOSIS — E1121 Type 2 diabetes mellitus with diabetic nephropathy: Secondary | ICD-10-CM | POA: Diagnosis not present

## 2016-07-01 DIAGNOSIS — L97422 Non-pressure chronic ulcer of left heel and midfoot with fat layer exposed: Secondary | ICD-10-CM | POA: Insufficient documentation

## 2016-07-01 DIAGNOSIS — S91101A Unspecified open wound of right great toe without damage to nail, initial encounter: Secondary | ICD-10-CM | POA: Diagnosis not present

## 2016-07-01 DIAGNOSIS — M10071 Idiopathic gout, right ankle and foot: Secondary | ICD-10-CM | POA: Insufficient documentation

## 2016-07-01 DIAGNOSIS — L97512 Non-pressure chronic ulcer of other part of right foot with fat layer exposed: Secondary | ICD-10-CM | POA: Insufficient documentation

## 2016-07-01 DIAGNOSIS — S91301A Unspecified open wound, right foot, initial encounter: Secondary | ICD-10-CM | POA: Diagnosis not present

## 2016-07-01 DIAGNOSIS — S91302A Unspecified open wound, left foot, initial encounter: Secondary | ICD-10-CM | POA: Diagnosis not present

## 2016-07-01 DIAGNOSIS — L97511 Non-pressure chronic ulcer of other part of right foot limited to breakdown of skin: Secondary | ICD-10-CM | POA: Diagnosis not present

## 2016-07-02 DIAGNOSIS — E11649 Type 2 diabetes mellitus with hypoglycemia without coma: Secondary | ICD-10-CM | POA: Diagnosis not present

## 2016-07-02 DIAGNOSIS — K604 Rectal fistula: Secondary | ICD-10-CM | POA: Diagnosis not present

## 2016-07-02 DIAGNOSIS — E1122 Type 2 diabetes mellitus with diabetic chronic kidney disease: Secondary | ICD-10-CM | POA: Diagnosis not present

## 2016-07-02 DIAGNOSIS — E1143 Type 2 diabetes mellitus with diabetic autonomic (poly)neuropathy: Secondary | ICD-10-CM | POA: Diagnosis not present

## 2016-07-02 DIAGNOSIS — N184 Chronic kidney disease, stage 4 (severe): Secondary | ICD-10-CM | POA: Diagnosis not present

## 2016-07-02 DIAGNOSIS — I130 Hypertensive heart and chronic kidney disease with heart failure and stage 1 through stage 4 chronic kidney disease, or unspecified chronic kidney disease: Secondary | ICD-10-CM | POA: Diagnosis not present

## 2016-07-04 DIAGNOSIS — L97511 Non-pressure chronic ulcer of other part of right foot limited to breakdown of skin: Secondary | ICD-10-CM | POA: Diagnosis not present

## 2016-07-04 DIAGNOSIS — E1122 Type 2 diabetes mellitus with diabetic chronic kidney disease: Secondary | ICD-10-CM | POA: Diagnosis not present

## 2016-07-04 DIAGNOSIS — M10071 Idiopathic gout, right ankle and foot: Secondary | ICD-10-CM | POA: Diagnosis not present

## 2016-07-04 DIAGNOSIS — E11621 Type 2 diabetes mellitus with foot ulcer: Secondary | ICD-10-CM | POA: Diagnosis not present

## 2016-07-04 DIAGNOSIS — I130 Hypertensive heart and chronic kidney disease with heart failure and stage 1 through stage 4 chronic kidney disease, or unspecified chronic kidney disease: Secondary | ICD-10-CM | POA: Diagnosis not present

## 2016-07-04 DIAGNOSIS — I5030 Unspecified diastolic (congestive) heart failure: Secondary | ICD-10-CM | POA: Diagnosis not present

## 2016-07-05 DIAGNOSIS — H3562 Retinal hemorrhage, left eye: Secondary | ICD-10-CM | POA: Diagnosis not present

## 2016-07-05 DIAGNOSIS — E113553 Type 2 diabetes mellitus with stable proliferative diabetic retinopathy, bilateral: Secondary | ICD-10-CM | POA: Diagnosis not present

## 2016-07-05 DIAGNOSIS — H353132 Nonexudative age-related macular degeneration, bilateral, intermediate dry stage: Secondary | ICD-10-CM | POA: Diagnosis not present

## 2016-07-05 DIAGNOSIS — H35352 Cystoid macular degeneration, left eye: Secondary | ICD-10-CM | POA: Diagnosis not present

## 2016-07-06 DIAGNOSIS — L97511 Non-pressure chronic ulcer of other part of right foot limited to breakdown of skin: Secondary | ICD-10-CM | POA: Diagnosis not present

## 2016-07-06 DIAGNOSIS — E11621 Type 2 diabetes mellitus with foot ulcer: Secondary | ICD-10-CM | POA: Diagnosis not present

## 2016-07-06 DIAGNOSIS — I5030 Unspecified diastolic (congestive) heart failure: Secondary | ICD-10-CM | POA: Diagnosis not present

## 2016-07-06 DIAGNOSIS — I130 Hypertensive heart and chronic kidney disease with heart failure and stage 1 through stage 4 chronic kidney disease, or unspecified chronic kidney disease: Secondary | ICD-10-CM | POA: Diagnosis not present

## 2016-07-06 DIAGNOSIS — E1122 Type 2 diabetes mellitus with diabetic chronic kidney disease: Secondary | ICD-10-CM | POA: Diagnosis not present

## 2016-07-06 DIAGNOSIS — M10071 Idiopathic gout, right ankle and foot: Secondary | ICD-10-CM | POA: Diagnosis not present

## 2016-07-09 DIAGNOSIS — M10071 Idiopathic gout, right ankle and foot: Secondary | ICD-10-CM | POA: Diagnosis not present

## 2016-07-09 DIAGNOSIS — D631 Anemia in chronic kidney disease: Secondary | ICD-10-CM | POA: Diagnosis not present

## 2016-07-09 DIAGNOSIS — N183 Chronic kidney disease, stage 3 (moderate): Secondary | ICD-10-CM | POA: Diagnosis not present

## 2016-07-09 DIAGNOSIS — I130 Hypertensive heart and chronic kidney disease with heart failure and stage 1 through stage 4 chronic kidney disease, or unspecified chronic kidney disease: Secondary | ICD-10-CM | POA: Diagnosis not present

## 2016-07-09 DIAGNOSIS — I129 Hypertensive chronic kidney disease with stage 1 through stage 4 chronic kidney disease, or unspecified chronic kidney disease: Secondary | ICD-10-CM | POA: Diagnosis not present

## 2016-07-09 DIAGNOSIS — I5030 Unspecified diastolic (congestive) heart failure: Secondary | ICD-10-CM | POA: Diagnosis not present

## 2016-07-09 DIAGNOSIS — R809 Proteinuria, unspecified: Secondary | ICD-10-CM | POA: Diagnosis not present

## 2016-07-09 DIAGNOSIS — L97511 Non-pressure chronic ulcer of other part of right foot limited to breakdown of skin: Secondary | ICD-10-CM | POA: Diagnosis not present

## 2016-07-09 DIAGNOSIS — N184 Chronic kidney disease, stage 4 (severe): Secondary | ICD-10-CM | POA: Diagnosis not present

## 2016-07-09 DIAGNOSIS — E11621 Type 2 diabetes mellitus with foot ulcer: Secondary | ICD-10-CM | POA: Diagnosis not present

## 2016-07-09 DIAGNOSIS — E1122 Type 2 diabetes mellitus with diabetic chronic kidney disease: Secondary | ICD-10-CM | POA: Diagnosis not present

## 2016-07-09 DIAGNOSIS — N2581 Secondary hyperparathyroidism of renal origin: Secondary | ICD-10-CM | POA: Diagnosis not present

## 2016-07-13 DIAGNOSIS — E11621 Type 2 diabetes mellitus with foot ulcer: Secondary | ICD-10-CM | POA: Diagnosis not present

## 2016-07-13 DIAGNOSIS — L97511 Non-pressure chronic ulcer of other part of right foot limited to breakdown of skin: Secondary | ICD-10-CM | POA: Diagnosis not present

## 2016-07-13 DIAGNOSIS — M10071 Idiopathic gout, right ankle and foot: Secondary | ICD-10-CM | POA: Diagnosis not present

## 2016-07-13 DIAGNOSIS — I5030 Unspecified diastolic (congestive) heart failure: Secondary | ICD-10-CM | POA: Diagnosis not present

## 2016-07-13 DIAGNOSIS — I130 Hypertensive heart and chronic kidney disease with heart failure and stage 1 through stage 4 chronic kidney disease, or unspecified chronic kidney disease: Secondary | ICD-10-CM | POA: Diagnosis not present

## 2016-07-13 DIAGNOSIS — E1122 Type 2 diabetes mellitus with diabetic chronic kidney disease: Secondary | ICD-10-CM | POA: Diagnosis not present

## 2016-07-14 ENCOUNTER — Other Ambulatory Visit: Payer: Self-pay | Admitting: General Surgery

## 2016-07-15 DIAGNOSIS — M2550 Pain in unspecified joint: Secondary | ICD-10-CM | POA: Diagnosis not present

## 2016-07-15 DIAGNOSIS — Z6829 Body mass index (BMI) 29.0-29.9, adult: Secondary | ICD-10-CM | POA: Diagnosis not present

## 2016-07-15 DIAGNOSIS — Z79899 Other long term (current) drug therapy: Secondary | ICD-10-CM | POA: Diagnosis not present

## 2016-07-15 DIAGNOSIS — M1A09X1 Idiopathic chronic gout, multiple sites, with tophus (tophi): Secondary | ICD-10-CM | POA: Diagnosis not present

## 2016-07-15 DIAGNOSIS — E663 Overweight: Secondary | ICD-10-CM | POA: Diagnosis not present

## 2016-07-15 DIAGNOSIS — N183 Chronic kidney disease, stage 3 (moderate): Secondary | ICD-10-CM | POA: Diagnosis not present

## 2016-07-16 DIAGNOSIS — E11621 Type 2 diabetes mellitus with foot ulcer: Secondary | ICD-10-CM | POA: Diagnosis not present

## 2016-07-16 DIAGNOSIS — I5030 Unspecified diastolic (congestive) heart failure: Secondary | ICD-10-CM | POA: Diagnosis not present

## 2016-07-16 DIAGNOSIS — E1122 Type 2 diabetes mellitus with diabetic chronic kidney disease: Secondary | ICD-10-CM | POA: Diagnosis not present

## 2016-07-16 DIAGNOSIS — I130 Hypertensive heart and chronic kidney disease with heart failure and stage 1 through stage 4 chronic kidney disease, or unspecified chronic kidney disease: Secondary | ICD-10-CM | POA: Diagnosis not present

## 2016-07-16 DIAGNOSIS — L97511 Non-pressure chronic ulcer of other part of right foot limited to breakdown of skin: Secondary | ICD-10-CM | POA: Diagnosis not present

## 2016-07-16 DIAGNOSIS — M10071 Idiopathic gout, right ankle and foot: Secondary | ICD-10-CM | POA: Diagnosis not present

## 2016-07-20 DIAGNOSIS — B351 Tinea unguium: Secondary | ICD-10-CM | POA: Diagnosis not present

## 2016-07-20 DIAGNOSIS — E1151 Type 2 diabetes mellitus with diabetic peripheral angiopathy without gangrene: Secondary | ICD-10-CM | POA: Diagnosis not present

## 2016-07-20 DIAGNOSIS — E11621 Type 2 diabetes mellitus with foot ulcer: Secondary | ICD-10-CM | POA: Diagnosis not present

## 2016-07-20 DIAGNOSIS — M10071 Idiopathic gout, right ankle and foot: Secondary | ICD-10-CM | POA: Diagnosis not present

## 2016-07-20 DIAGNOSIS — L97511 Non-pressure chronic ulcer of other part of right foot limited to breakdown of skin: Secondary | ICD-10-CM | POA: Diagnosis not present

## 2016-07-20 DIAGNOSIS — I5030 Unspecified diastolic (congestive) heart failure: Secondary | ICD-10-CM | POA: Diagnosis not present

## 2016-07-20 DIAGNOSIS — E1122 Type 2 diabetes mellitus with diabetic chronic kidney disease: Secondary | ICD-10-CM | POA: Diagnosis not present

## 2016-07-20 DIAGNOSIS — I130 Hypertensive heart and chronic kidney disease with heart failure and stage 1 through stage 4 chronic kidney disease, or unspecified chronic kidney disease: Secondary | ICD-10-CM | POA: Diagnosis not present

## 2016-07-20 DIAGNOSIS — L84 Corns and callosities: Secondary | ICD-10-CM | POA: Diagnosis not present

## 2016-07-22 ENCOUNTER — Encounter (HOSPITAL_BASED_OUTPATIENT_CLINIC_OR_DEPARTMENT_OTHER): Payer: Self-pay | Admitting: *Deleted

## 2016-07-22 DIAGNOSIS — I5030 Unspecified diastolic (congestive) heart failure: Secondary | ICD-10-CM | POA: Diagnosis not present

## 2016-07-22 DIAGNOSIS — I130 Hypertensive heart and chronic kidney disease with heart failure and stage 1 through stage 4 chronic kidney disease, or unspecified chronic kidney disease: Secondary | ICD-10-CM | POA: Diagnosis not present

## 2016-07-22 DIAGNOSIS — E1122 Type 2 diabetes mellitus with diabetic chronic kidney disease: Secondary | ICD-10-CM | POA: Diagnosis not present

## 2016-07-22 DIAGNOSIS — E11621 Type 2 diabetes mellitus with foot ulcer: Secondary | ICD-10-CM | POA: Diagnosis not present

## 2016-07-22 DIAGNOSIS — M10071 Idiopathic gout, right ankle and foot: Secondary | ICD-10-CM | POA: Diagnosis not present

## 2016-07-22 DIAGNOSIS — L97511 Non-pressure chronic ulcer of other part of right foot limited to breakdown of skin: Secondary | ICD-10-CM | POA: Diagnosis not present

## 2016-07-22 NOTE — Progress Notes (Signed)
NPO AFTER MN.  ARRIVE AT 0600.  NEEDS ISTAT 8.  CURRENT EKG IN CHART AND EPIC.  (THIS PT WAS RESCHEDULED FROM 06-24-2016 BUT CANCELLED DUE TO K+ 6.5, LAST K+ IN EPIC 4.4 ON 06-28-2016)

## 2016-07-26 DIAGNOSIS — L97511 Non-pressure chronic ulcer of other part of right foot limited to breakdown of skin: Secondary | ICD-10-CM | POA: Diagnosis not present

## 2016-07-26 DIAGNOSIS — I5030 Unspecified diastolic (congestive) heart failure: Secondary | ICD-10-CM | POA: Diagnosis not present

## 2016-07-26 DIAGNOSIS — E1122 Type 2 diabetes mellitus with diabetic chronic kidney disease: Secondary | ICD-10-CM | POA: Diagnosis not present

## 2016-07-26 DIAGNOSIS — M10071 Idiopathic gout, right ankle and foot: Secondary | ICD-10-CM | POA: Diagnosis not present

## 2016-07-26 DIAGNOSIS — E11621 Type 2 diabetes mellitus with foot ulcer: Secondary | ICD-10-CM | POA: Diagnosis not present

## 2016-07-26 DIAGNOSIS — I130 Hypertensive heart and chronic kidney disease with heart failure and stage 1 through stage 4 chronic kidney disease, or unspecified chronic kidney disease: Secondary | ICD-10-CM | POA: Diagnosis not present

## 2016-07-28 DIAGNOSIS — E1122 Type 2 diabetes mellitus with diabetic chronic kidney disease: Secondary | ICD-10-CM | POA: Diagnosis not present

## 2016-07-28 DIAGNOSIS — L97511 Non-pressure chronic ulcer of other part of right foot limited to breakdown of skin: Secondary | ICD-10-CM | POA: Diagnosis not present

## 2016-07-28 DIAGNOSIS — I5030 Unspecified diastolic (congestive) heart failure: Secondary | ICD-10-CM | POA: Diagnosis not present

## 2016-07-28 DIAGNOSIS — M10071 Idiopathic gout, right ankle and foot: Secondary | ICD-10-CM | POA: Diagnosis not present

## 2016-07-28 DIAGNOSIS — I130 Hypertensive heart and chronic kidney disease with heart failure and stage 1 through stage 4 chronic kidney disease, or unspecified chronic kidney disease: Secondary | ICD-10-CM | POA: Diagnosis not present

## 2016-07-28 DIAGNOSIS — E11621 Type 2 diabetes mellitus with foot ulcer: Secondary | ICD-10-CM | POA: Diagnosis not present

## 2016-07-29 ENCOUNTER — Encounter (HOSPITAL_BASED_OUTPATIENT_CLINIC_OR_DEPARTMENT_OTHER)
Admission: RE | Disposition: A | Discharge status: Home / Self Care | Payer: Self-pay | Source: Ambulatory Visit | Attending: General Surgery

## 2016-07-29 ENCOUNTER — Ambulatory Visit (HOSPITAL_BASED_OUTPATIENT_CLINIC_OR_DEPARTMENT_OTHER): Payer: Medicare Other | Admitting: Anesthesiology

## 2016-07-29 ENCOUNTER — Encounter (HOSPITAL_BASED_OUTPATIENT_CLINIC_OR_DEPARTMENT_OTHER): Payer: Self-pay | Admitting: *Deleted

## 2016-07-29 ENCOUNTER — Ambulatory Visit (INDEPENDENT_AMBULATORY_CARE_PROVIDER_SITE_OTHER): Payer: Medicare Other | Admitting: Pediatrics

## 2016-07-29 ENCOUNTER — Encounter: Payer: Self-pay | Admitting: Pediatrics

## 2016-07-29 ENCOUNTER — Ambulatory Visit (HOSPITAL_BASED_OUTPATIENT_CLINIC_OR_DEPARTMENT_OTHER)
Admission: RE | Admit: 2016-07-29 | Discharge: 2016-07-29 | Disposition: A | Discharge status: Home / Self Care | Payer: Medicare Other | Source: Ambulatory Visit | Attending: General Surgery | Admitting: General Surgery

## 2016-07-29 VITALS — BP 152/76 | HR 65 | Temp 98.1°F | Ht 62.0 in | Wt 159.0 lb

## 2016-07-29 DIAGNOSIS — M170 Bilateral primary osteoarthritis of knee: Secondary | ICD-10-CM | POA: Diagnosis not present

## 2016-07-29 DIAGNOSIS — K219 Gastro-esophageal reflux disease without esophagitis: Secondary | ICD-10-CM | POA: Insufficient documentation

## 2016-07-29 DIAGNOSIS — E785 Hyperlipidemia, unspecified: Secondary | ICD-10-CM | POA: Insufficient documentation

## 2016-07-29 DIAGNOSIS — D631 Anemia in chronic kidney disease: Secondary | ICD-10-CM | POA: Insufficient documentation

## 2016-07-29 DIAGNOSIS — E11319 Type 2 diabetes mellitus with unspecified diabetic retinopathy without macular edema: Secondary | ICD-10-CM | POA: Insufficient documentation

## 2016-07-29 DIAGNOSIS — M19041 Primary osteoarthritis, right hand: Secondary | ICD-10-CM | POA: Diagnosis not present

## 2016-07-29 DIAGNOSIS — Z9842 Cataract extraction status, left eye: Secondary | ICD-10-CM | POA: Insufficient documentation

## 2016-07-29 DIAGNOSIS — E875 Hyperkalemia: Secondary | ICD-10-CM

## 2016-07-29 DIAGNOSIS — L89159 Pressure ulcer of sacral region, unspecified stage: Secondary | ICD-10-CM | POA: Diagnosis not present

## 2016-07-29 DIAGNOSIS — E039 Hypothyroidism, unspecified: Secondary | ICD-10-CM | POA: Insufficient documentation

## 2016-07-29 DIAGNOSIS — I2510 Atherosclerotic heart disease of native coronary artery without angina pectoris: Secondary | ICD-10-CM | POA: Insufficient documentation

## 2016-07-29 DIAGNOSIS — F419 Anxiety disorder, unspecified: Secondary | ICD-10-CM | POA: Insufficient documentation

## 2016-07-29 DIAGNOSIS — K5900 Constipation, unspecified: Secondary | ICD-10-CM | POA: Insufficient documentation

## 2016-07-29 DIAGNOSIS — Z794 Long term (current) use of insulin: Secondary | ICD-10-CM | POA: Insufficient documentation

## 2016-07-29 DIAGNOSIS — M19042 Primary osteoarthritis, left hand: Secondary | ICD-10-CM | POA: Insufficient documentation

## 2016-07-29 DIAGNOSIS — I129 Hypertensive chronic kidney disease with stage 1 through stage 4 chronic kidney disease, or unspecified chronic kidney disease: Secondary | ICD-10-CM | POA: Diagnosis not present

## 2016-07-29 DIAGNOSIS — E1122 Type 2 diabetes mellitus with diabetic chronic kidney disease: Secondary | ICD-10-CM | POA: Insufficient documentation

## 2016-07-29 DIAGNOSIS — Z79899 Other long term (current) drug therapy: Secondary | ICD-10-CM | POA: Insufficient documentation

## 2016-07-29 DIAGNOSIS — Z885 Allergy status to narcotic agent status: Secondary | ICD-10-CM | POA: Insufficient documentation

## 2016-07-29 DIAGNOSIS — Z5309 Procedure and treatment not carried out because of other contraindication: Secondary | ICD-10-CM | POA: Insufficient documentation

## 2016-07-29 DIAGNOSIS — M109 Gout, unspecified: Secondary | ICD-10-CM | POA: Insufficient documentation

## 2016-07-29 DIAGNOSIS — K3184 Gastroparesis: Secondary | ICD-10-CM | POA: Insufficient documentation

## 2016-07-29 DIAGNOSIS — Z881 Allergy status to other antibiotic agents status: Secondary | ICD-10-CM | POA: Insufficient documentation

## 2016-07-29 DIAGNOSIS — Z9841 Cataract extraction status, right eye: Secondary | ICD-10-CM | POA: Insufficient documentation

## 2016-07-29 DIAGNOSIS — E1143 Type 2 diabetes mellitus with diabetic autonomic (poly)neuropathy: Secondary | ICD-10-CM | POA: Insufficient documentation

## 2016-07-29 DIAGNOSIS — Z800 Family history of malignant neoplasm of digestive organs: Secondary | ICD-10-CM | POA: Insufficient documentation

## 2016-07-29 DIAGNOSIS — Z86010 Personal history of colonic polyps: Secondary | ICD-10-CM | POA: Insufficient documentation

## 2016-07-29 DIAGNOSIS — Z880 Allergy status to penicillin: Secondary | ICD-10-CM | POA: Insufficient documentation

## 2016-07-29 DIAGNOSIS — K5790 Diverticulosis of intestine, part unspecified, without perforation or abscess without bleeding: Secondary | ICD-10-CM | POA: Diagnosis not present

## 2016-07-29 DIAGNOSIS — Z886 Allergy status to analgesic agent status: Secondary | ICD-10-CM | POA: Insufficient documentation

## 2016-07-29 DIAGNOSIS — N183 Chronic kidney disease, stage 3 (moderate): Secondary | ICD-10-CM | POA: Diagnosis not present

## 2016-07-29 DIAGNOSIS — Z8249 Family history of ischemic heart disease and other diseases of the circulatory system: Secondary | ICD-10-CM | POA: Insufficient documentation

## 2016-07-29 DIAGNOSIS — Z888 Allergy status to other drugs, medicaments and biological substances status: Secondary | ICD-10-CM | POA: Insufficient documentation

## 2016-07-29 DIAGNOSIS — Z833 Family history of diabetes mellitus: Secondary | ICD-10-CM | POA: Insufficient documentation

## 2016-07-29 DIAGNOSIS — I4510 Unspecified right bundle-branch block: Secondary | ICD-10-CM | POA: Insufficient documentation

## 2016-07-29 DIAGNOSIS — K603 Anal fistula: Secondary | ICD-10-CM | POA: Insufficient documentation

## 2016-07-29 HISTORY — DX: Anal fistula: K60.3

## 2016-07-29 HISTORY — DX: Anal fistula, unspecified: K60.30

## 2016-07-29 HISTORY — DX: Chronic gout due to renal impairment, unspecified ankle and foot, with tophus (tophi): M1A.3791

## 2016-07-29 LAB — POCT I-STAT, CHEM 8
BUN: 38 mg/dL — ABNORMAL HIGH (ref 6–20)
BUN: 35 mg/dL — ABNORMAL HIGH (ref 6–20)
CREATININE: 1.5 mg/dL — AB (ref 0.44–1.00)
Calcium, Ion: 1.32 mmol/L (ref 1.15–1.40)
Calcium, Ion: 1.25 mmol/L (ref 1.15–1.40)
Chloride: 108 mmol/L (ref 101–111)
Chloride: 108 mmol/L (ref 101–111)
Creatinine, Ser: 1.4 mg/dL — ABNORMAL HIGH (ref 0.44–1.00)
GLUCOSE: 206 mg/dL — AB (ref 65–99)
GLUCOSE: 211 mg/dL — AB (ref 65–99)
HEMATOCRIT: 37 % (ref 36.0–46.0)
HEMATOCRIT: 39 % (ref 36.0–46.0)
HEMOGLOBIN: 12.6 g/dL (ref 12.0–15.0)
HEMOGLOBIN: 13.3 g/dL (ref 12.0–15.0)
POTASSIUM: 6 mmol/L — AB (ref 3.5–5.1)
Potassium: 6 mmol/L — ABNORMAL HIGH (ref 3.5–5.1)
Sodium: 139 mmol/L (ref 135–145)
Sodium: 139 mmol/L (ref 135–145)
TCO2: 23 mmol/L (ref 0–100)
TCO2: 23 mmol/L (ref 0–100)

## 2016-07-29 LAB — POTASSIUM: POTASSIUM: 6.1 mmol/L — AB (ref 3.5–5.1)

## 2016-07-29 SURGERY — LIGATION, INTERNAL FISTULA TRACT
Anesthesia: General

## 2016-07-29 MED ORDER — SODIUM CHLORIDE 0.9 % IV SOLN
INTRAVENOUS | Status: DC
  Filled 2016-07-29: qty 1000

## 2016-07-29 MED ORDER — INSULIN ASPART 100 UNIT/ML ~~LOC~~ SOLN
SUBCUTANEOUS | Status: AC
  Filled 2016-07-29: qty 1

## 2016-07-29 MED ORDER — MIDAZOLAM HCL 2 MG/2ML IJ SOLN
INTRAMUSCULAR | Status: AC
  Filled 2016-07-29: qty 2

## 2016-07-29 MED ORDER — PROPOFOL 500 MG/50ML IV EMUL
INTRAVENOUS | Status: AC
  Filled 2016-07-29: qty 50

## 2016-07-29 MED ORDER — LIDOCAINE 2% (20 MG/ML) 5 ML SYRINGE
INTRAMUSCULAR | Status: AC
  Filled 2016-07-29: qty 5

## 2016-07-29 MED ORDER — FUROSEMIDE 10 MG/ML IJ SOLN
INTRAMUSCULAR | Status: AC
  Filled 2016-07-29: qty 4

## 2016-07-29 MED ORDER — FENTANYL CITRATE (PF) 100 MCG/2ML IJ SOLN
INTRAMUSCULAR | Status: AC
  Filled 2016-07-29: qty 2

## 2016-07-29 MED ORDER — ONDANSETRON HCL 4 MG/2ML IJ SOLN
INTRAMUSCULAR | Status: AC
  Filled 2016-07-29: qty 2

## 2016-07-29 MED ORDER — ARTIFICIAL TEARS OP OINT
TOPICAL_OINTMENT | OPHTHALMIC | Status: AC
  Filled 2016-07-29: qty 3.5

## 2016-07-29 MED ORDER — DEXTROSE 50 % IV SOLN
INTRAVENOUS | Status: AC
  Filled 2016-07-29: qty 50

## 2016-07-29 SURGICAL SUPPLY — 56 items
BENZOIN TINCTURE PRP APPL 2/3 (GAUZE/BANDAGES/DRESSINGS) IMPLANT
BLADE HEX COATED 2.75 (ELECTRODE) IMPLANT
BLADE SURG 10 STRL SS (BLADE) IMPLANT
BLADE SURG 15 STRL LF DISP TIS (BLADE) IMPLANT
BLADE SURG 15 STRL SS (BLADE)
BRIEF STRETCH FOR OB PAD LRG (UNDERPADS AND DIAPERS) IMPLANT
CANISTER SUCT 3000ML PPV (MISCELLANEOUS) IMPLANT
COVER BACK TABLE 60X90IN (DRAPES) IMPLANT
COVER MAYO STAND STRL (DRAPES) IMPLANT
DECANTER SPIKE VIAL GLASS SM (MISCELLANEOUS) IMPLANT
DRAPE LAPAROTOMY 100X72 PEDS (DRAPES) IMPLANT
DRAPE UTILITY XL STRL (DRAPES) IMPLANT
ELECT BLADE 6.5 .24CM SHAFT (ELECTRODE) IMPLANT
ELECT REM PT RETURN 9FT ADLT (ELECTROSURGICAL)
ELECTRODE REM PT RTRN 9FT ADLT (ELECTROSURGICAL) IMPLANT
GAUZE SPONGE 4X4 16PLY XRAY LF (GAUZE/BANDAGES/DRESSINGS) IMPLANT
GAUZE VASELINE 3X9 (GAUZE/BANDAGES/DRESSINGS) IMPLANT
GLOVE BIO SURGEON STRL SZ 6.5 (GLOVE) IMPLANT
GLOVE BIO SURGEONS STRL SZ 6.5 (GLOVE)
GLOVE INDICATOR 7.0 STRL GRN (GLOVE) IMPLANT
GOWN STRL REUS W/ TWL LRG LVL3 (GOWN DISPOSABLE) IMPLANT
GOWN STRL REUS W/ TWL XL LVL3 (GOWN DISPOSABLE) IMPLANT
GOWN STRL REUS W/TWL LRG LVL3 (GOWN DISPOSABLE)
GOWN STRL REUS W/TWL XL LVL3 (GOWN DISPOSABLE)
HYDROGEN PEROXIDE 16OZ (MISCELLANEOUS) IMPLANT
IV CATH 18G SAFETY (IV SOLUTION) IMPLANT
KIT RM TURNOVER CYSTO AR (KITS) IMPLANT
LOOP VESSEL MAXI BLUE (MISCELLANEOUS) IMPLANT
MANIFOLD NEPTUNE II (INSTRUMENTS) IMPLANT
NEEDLE HYPO 25X1 1.5 SAFETY (NEEDLE) IMPLANT
NS IRRIG 500ML POUR BTL (IV SOLUTION) IMPLANT
PACK BASIN DAY SURGERY FS (CUSTOM PROCEDURE TRAY) IMPLANT
PAD ABD 8X10 STRL (GAUZE/BANDAGES/DRESSINGS) IMPLANT
PAD ARMBOARD 7.5X6 YLW CONV (MISCELLANEOUS) IMPLANT
PENCIL BUTTON HOLSTER BLD 10FT (ELECTRODE) IMPLANT
RETRACTOR STAY HOOK 5MM (MISCELLANEOUS) IMPLANT
RETRACTOR STERILE 25.8CMX11.3 (INSTRUMENTS) IMPLANT
SPONGE GAUZE 4X4 12PLY STER LF (GAUZE/BANDAGES/DRESSINGS) IMPLANT
SPONGE SURGIFOAM ABS GEL 12-7 (HEMOSTASIS) IMPLANT
SUCTION FRAZIER HANDLE 10FR (MISCELLANEOUS)
SUCTION TUBE FRAZIER 10FR DISP (MISCELLANEOUS) IMPLANT
SUT CHROMIC 2 0 SH (SUTURE) IMPLANT
SUT CHROMIC 3 0 SH 27 (SUTURE) IMPLANT
SUT ETHIBOND 0 (SUTURE) IMPLANT
SUT SILK 2 0 (SUTURE)
SUT SILK 2-0 18XBRD TIE 12 (SUTURE) IMPLANT
SUT SILK 3 0 SH 30 (SUTURE) IMPLANT
SUT VIC AB 3-0 SH 8-18 (SUTURE) IMPLANT
SUT VICRYL 3 0 UR 6 27 (SUTURE) IMPLANT
SYR BULB IRRIGATION 50ML (SYRINGE) IMPLANT
SYR CONTROL 10ML LL (SYRINGE) IMPLANT
TOWEL OR 17X24 6PK STRL BLUE (TOWEL DISPOSABLE) IMPLANT
TRAY DSU PREP LF (CUSTOM PROCEDURE TRAY) IMPLANT
TUBE CONNECTING 12'X1/4 (SUCTIONS)
TUBE CONNECTING 12X1/4 (SUCTIONS) IMPLANT
YANKAUER SUCT BULB TIP NO VENT (SUCTIONS) IMPLANT

## 2016-07-29 NOTE — OR Nursing (Signed)
Case cancelled

## 2016-07-29 NOTE — Patient Instructions (Signed)
Take kay-exalate when you get home If you dont have a bowel movement within 6 hours, take a dose of miralax Come back after breakfast tomorrow for lab draw

## 2016-07-29 NOTE — H&P (Signed)
HPI: 71 y.o. F with chronic wound being treated by the wound clinic.  A fistula was noted and a seton was placed through this.  The wound has since healed around the fistula.        Past Medical History:  Diagnosis Date  . Anemia in chronic renal disease    Aranesp injection --  when Hg <11, last injection 12-24-14.  Marland Kitchen Anxiety   . Arthritis    knees and hand/fingers. "broke back"-being evaluated for this"weakness left leg"  . CKD (chronic kidney disease), stage III    nephrologist--  dr Mercy Moore  . Complication of anesthesia    post-op confusion   . Constipation   . Coronary artery disease   . Diabetic gastroparesis ([place])   . Diabetic retinopathy ([place])    bilateral --  monitored by dr [name]  . Diverticulosis of colon   . GAD (generalized anxiety disorder)   . GERD (gastroesophageal reflux disease)   . History of colon polyps    benign  . History of esophagitis   . History of GI bleed    upper 2009  due to esophagitis  &  2002  due to Mallory-Weiss tear  . History of gout    in issues for several years  . 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 History of rectal abscess    12-29-2004  bedside I & D  . Hyperlipidemia   . Hypertension   . Hypothyroidism   . LAFB (left anterior fascicular block)   . Right bundle branch block   . Sacral decubitus ulcer    since 2014- 01-02-15 remains with wound" gauze dressing changes daily.  . Type II diabetes mellitus ([place])          Past Surgical History:  Procedure Laterality Date  . APPLICATION OF A-CELL OF EXTREMITY N/A 04/07/2015   Procedure: A CELL PLACMENT;  Surgeon: Loel Lofty Dillingham, DO;  Location: [facility];  Service: Plastics;  Laterality: N/A;  . CARDIOVASCULAR STRESS TEST  12-30-2004   normal perfusion study/  no ischemia or infartion/  normal LV wall function and wall motion , ef 66%  . CATARACT EXTRACTION W/ INTRAOCULAR LENS  IMPLANT, BILATERAL  1995  .  COLONOSCOPY W/ POLYPECTOMY  last one 2008  . COMPRESSION HIP SCREW Right 05/18/2014   Procedure: COMPRESSION HIP;  Surgeon: Carole Civil, MD;  Location: [facility];  Service: Orthopedics;  Laterality: Right;  . ESOPHAGOGASTRODUODENOSCOPY  last one 01-09-2011  . EVALUATION UNDER ANESTHESIA WITH FISTULECTOMY N/A 01/06/2015   Procedure: EXAM UNDER ANESTHESIA , placement of seton;  Surgeon: Jackolyn Confer, MD;  Location: [facility];  Service: General;  Laterality: N/A;  . I&D EXTREMITY N/A 04/07/2015   Procedure: IRRIGATION AND DEBRIDEMENT ISCHIAL ULCER;  Surgeon: Loel Lofty Dillingham, DO;  Location: [facility];  Service: Plastics;  Laterality: N/A;  . INCISION AND DRAINAGE OF WOUND N/A 09/30/2014   Procedure: IRRIGATION AND DEBRIDEMENT SACRAL WOUND, EXCISION OF PERIRECTAL TRACT WITH PLACEMENT OF ACCELL;  Surgeon: Theodoro Kos, DO;  Location: [facility];  Service: Plastics;  Laterality: N/A;  . ORIF FEMUR FRACTURE Left 10/09/2012   Procedure: OPEN REDUCTION INTERNAL FIXATION (ORIF) DISTAL FEMUR FRACTURE;  Surgeon: Rozanna Box, MD;  Location: [facility];  Service: Orthopedics;  Laterality: Left;  . RETINOPATHY SURGERY Bilateral 1980's?  . TRANSTHORACIC ECHOCARDIOGRAM  02-18-2011   mild LVH,  ef 55-60%,  grade I diastolic dysfunction/  mild TR/  RV systolic pressure  increased consistant with moderate pulmonary hypertension         Family History  Problem Relation Age of Onset  . Colon cancer Father   . Prostate cancer Father   . Diabetes Father   . Coronary artery disease Mother 43  . Heart disease Mother   . Diabetes Mother   . Coronary artery disease Sister 8  . Diabetes Sister   . Heart disease Sister   . Diabetes Sister   . Diabetes Sister   . Diabetes Sister   . Diabetes Sister   . Diabetes Sister   . Diabetes Maternal Grandmother    Social History:  reports that she has never smoked. She has never used smokeless tobacco. She reports that she does not  drink alcohol or use drugs.  Allergies:       Allergies  Allergen Reactions  . Aspirin Nausea And Vomiting  . Ciprofloxacin Other (See Comments)    Upset Stomach  . Codeine Nausea And Vomiting    Makes me sick   . Micardis [Telmisartan] Other (See Comments)    unknown  . Nexium [Esomeprazole Magnesium] Other (See Comments)    Causes internal bleeding  . Niaspan [Niacin Er] Other (See Comments)    Reaction is unknown  . Onglyza [Saxagliptin] Other (See Comments)    Reaction is unknown  . Other     No otc pain medications  . Rofecoxib Other (See Comments)    unknown  . Simvastatin   . Tequin [Gatifloxacin] Other (See Comments)    Reaction is unknown  . Welchol [Colesevelam Hcl]   . Amoxicillin Rash          Medications Prior to Admission  Medication Sig Dispense Refill  . allopurinol (ZYLOPRIM) 100 MG tablet Take 1 tablet (100 mg total) by mouth daily. 30 tablet 0  . amLODipine (NORVASC) 5 MG tablet Take 1 tablet (5 mg total) by mouth daily. 90 tablet 1  . atorvastatin (LIPITOR) 40 MG tablet Take 1 tablet (40 mg total) by mouth every evening. 90 tablet 1  . Blood Glucose Monitoring Suppl (ACCU-CHEK AVIVA PLUS) w/Device KIT Use as directed 4 times daily 1 kit 0  . Colchicine 0.6 MG CAPS Take by mouth daily.    . febuxostat (ULORIC) 40 MG tablet Take 40 mg by mouth daily.    Marland Kitchen glucose blood (ACCU-CHEK AVIVA) test strip Use to test glucose 3 times a day 150 each 3  . glucose blood (ACCU-CHEK AVIVA) test strip Use as instructed 4 x daily. E11.65 150 each 5  . insulin aspart protamine - aspart (NOVOLOG MIX 70/30 FLEXPEN) (70-30) 100 UNIT/ML FlexPen Inject 0.08 mLs (8 Units total) into the skin daily with breakfast. Only if blood glucose is above 90 15 mL 2  . Insulin Pen Needle (UNIFINE PENTIPS) 32G X 4 MM MISC Use as directed bid. E11.65 100 each 5  . Lancets (ACCU-CHEK SOFT TOUCH) lancets Use as instructed 4 x daily, E11.65 150 each 5  .  levothyroxine (SYNTHROID, LEVOTHROID) 75 MCG tablet Take 1 tablet (75 mcg total) by mouth daily before breakfast. 90 tablet 1  . metoprolol succinate (TOPROL-XL) 50 MG 24 hr tablet Take 1 tablet (50 mg total) by mouth every morning. 90 tablet 1  . pantoprazole (PROTONIX) 20 MG tablet Take 1 tablet (20 mg total) by mouth daily. 90 tablet 1  . UNIFINE PENTIPS 32G X 4 MM MISC       LabResultsLast48Hours  No results found for this or any  previous visit (from the past 48 hour(s)).   ImagingResults(Last48hours)  No results found.    Review of Systems  Constitutional: Negative for chills and fever.  HENT: Negative for congestion and hearing loss.   Eyes: Negative for blurred vision and double vision.  Respiratory: Negative for cough and shortness of breath.   Cardiovascular: Negative for chest pain and palpitations.  Gastrointestinal: Negative for abdominal pain, nausea and vomiting.  Genitourinary: Negative for dysuria, frequency and urgency.  Musculoskeletal: Negative for myalgias.  Skin: Negative for itching and rash.  Neurological: Negative for dizziness.    BP (!) 191/66   Pulse 62   Temp 97.8 F (36.6 C) (Oral)   Resp 16   Ht '5\' 2"'  (1.575 m)   Wt 71.7 kg (158 lb)   SpO2 98%   BMI 28.90 kg/m    Physical Exam  Constitutional: She is oriented to person, place, and time. She appears well-developed and well-nourished.  HENT:  Head: Normocephalic and atraumatic.  Eyes: Conjunctivae and EOM are normal. Pupils are equal, round, and reactive to light.  Neck: Normal range of motion. Neck supple.  Cardiovascular: Normal rate and regular rhythm.   Respiratory: Effort normal and breath sounds normal.  GI: Soft. Bowel sounds are normal. She exhibits no distension.  Musculoskeletal: Normal range of motion.  Neurological: She is alert and oriented to person, place, and time.  Skin: Skin is warm and dry.     Assessment/Plan Pt was noted again to be hyperkalemic.   After discussing with the attending anesthesiologist and her nephrologist, we have decided to cancel the surgery.  Pt will be sent to Dr Mercy Moore office today for treatment of her hyperkalemia.  We will have her f/u with him again the day prior to her rescheduled surgery.

## 2016-07-29 NOTE — Progress Notes (Addendum)
  Subjective:   Patient ID: [name], female    DOB: Sep 25, 1945, 71 y.o.   MRN: [id number] CC: Elevated Potassium (Surgery Canceled again)  HPI: [name] is a 71 y.o. female presenting for Elevated Potassium (Surgery Canceled again)  Surgery scheduled for this morning for seton removal Woke up today at 230a to get there Took her amlodipine, didn't take her insulin or other medications Usually takes insulin 8-9 am K at 7am pre-op was elevated Surgery canceled Last night ate onions and squash for dinner K last check in office was 4.4 after prior surgery cancellation Kayexelate was sent in to bring K down Pt here for f/u  Surgery to be scheduled in future with labs drawn at Dr Mattingly's office the day before and sent in  Feeling well now No swelling in LE No CP, no SOB  Relevant past medical, surgical, family and social history reviewed. Allergies and medications reviewed and updated. History  Smoking Status  . Never Smoker  Smokeless Tobacco  . Never Used   ROS: Per HPI   Objective:    BP (!) 152/76   Pulse 65   Temp 98.1 F (36.7 C) (Oral)   Ht '5\' 2"'$  (1.575 m)   Wt 159 lb (72.1 kg)   BMI 29.08 kg/m   Wt Readings from Last 3 Encounters:  07/29/16 159 lb (72.1 kg)  07/29/16 158 lb (71.7 kg)  06/28/16 156 lb 9.6 oz (71 kg)    Gen: NAD, alert, cooperative with exam, NCAT EYES: EOMI, no conjunctival injection, or no icterus CV: NRRR, normal S1/S2, no murmur, distal pulses 2+ b/l Resp: CTABL, no wheezes, normal WOB Abd: +BS, soft, NTND. Ext: No edema, warm Neuro: Alert and oriented  EKG: HR 64, NSR, RBBB. No peaked t waves. Similar to priors from 2015  Assessment & Plan:  [name] was seen today for elevated potassium.  Diagnoses and all orders for this visit:  Hyperkalemia Has started uloric within the past two months Rarely (<1%) per literature can cause hypo or hyperkalemia Has had normal K levels in between elevations pre-surgery in  hospital Eating foods with potassium but not in excess EKG without peaked t waves today, similar to priors Pt to take kayexelate tonight rtc tomorrow for lab draw. Bring in all medications for [place] to review what meds she is taking at home. Will schedule f/u with nephrology -     BMP8+EGFR; Future -     EKG 12-Lead  Follow up plan: tomorrow for K check Assunta Found, MD Tristan Schroeder [facility]  ADDENDUM: Returned 3/9 for K draw after taking kayexelate yesterday Brought medications with her Has been taking losartan, metoprolol at home not on med list  With clonidine, glimeperide, tradjenta in med box as well, says she isnt taking them, also not on med list Updated med list, threw away medicines she isnt supposed to take.  Added losartan to allergy list for hyperkalemia. Pt to call and update pharmacy so will not get autorefills. Losartan likely cause of persistent hyperkalemia

## 2016-07-29 NOTE — Progress Notes (Signed)
Patient istat showed a k of 6. The test was repeated and sent to lab.the lab results were 6.1.  Dr.Thomas and Dr.Greene spoke with her doctor and the case was cancelled. The patient will be seen by her medical doctor this afternoon and decisions will be made how to treat patient.

## 2016-07-29 NOTE — Anesthesia Preprocedure Evaluation (Addendum)
Anesthesia Evaluation  Patient identified by MRN, date of birth, ID band Patient awake  General Assessment Comment:Discussed case with Dr. Lona Millard phone who stated that if case was not an emergency it should be cancelled. He talked to Dr. Marcello Moores on phone about case as well. Dr. Mercy Moore and nurse  did not think it necessary to see patient today. His nurse  asked staff to inform patient to pick up Rx at his pharmacy. I did call and talk to her primary doctor's nurse who made an appointment for patient today at 3pm. Patient informed about 3pm appointment witth Dr. Sharee Pimple and given note and told to get Dr. Sharee Pimple  To call Dr. Etheleen Nicks office aboutt follow up. ggg   Reviewed: Unable to perform ROS - Chart review only  Airway Mallampati: II  TM Distance: >3 FB     Dental   Pulmonary           Cardiovascular hypertension, + CAD  + dysrhythmias      Neuro/Psych    GI/Hepatic Neg liver ROS, GERD  ,  Endo/Other  diabetesHypothyroidism   Renal/GU Renal disease     Musculoskeletal   Abdominal   Peds  Hematology  (+) anemia ,   Anesthesia Other Findings   Reproductive/Obstetrics                            Anesthesia Physical Anesthesia Plan  ASA: III  Anesthesia Plan: General   Post-op Pain Management:    Induction: Intravenous  Airway Management Planned: LMA  Additional Equipment:   Intra-op Plan:   Post-operative Plan: Extubation in OR  Informed Consent: I have reviewed the patients History and Physical, chart, labs and discussed the procedure including the risks, benefits and alternatives for the proposed anesthesia with the patient or authorized representative who has indicated his/her understanding and acceptance.   Dental advisory given  Plan Discussed with: CRNA and Anesthesiologist  Anesthesia Plan Comments:         Anesthesia Quick Evaluation

## 2016-07-30 ENCOUNTER — Other Ambulatory Visit (INDEPENDENT_AMBULATORY_CARE_PROVIDER_SITE_OTHER): Payer: Medicare Other

## 2016-07-30 ENCOUNTER — Other Ambulatory Visit: Payer: Self-pay | Admitting: *Deleted

## 2016-07-30 DIAGNOSIS — E875 Hyperkalemia: Secondary | ICD-10-CM

## 2016-07-31 LAB — BMP8+EGFR
BUN / CREAT RATIO: 18 (ref 12–28)
BUN: 31 mg/dL — ABNORMAL HIGH (ref 8–27)
CO2: 20 mmol/L (ref 18–29)
CREATININE: 1.71 mg/dL — AB (ref 0.57–1.00)
Calcium: 8.9 mg/dL (ref 8.7–10.3)
Chloride: 103 mmol/L (ref 96–106)
GFR calc Af Amer: 34 mL/min/{1.73_m2} — ABNORMAL LOW (ref >59)
GFR, EST NON AFRICAN AMERICAN: 30 mL/min/{1.73_m2} — AB (ref >59)
GLUCOSE: 155 mg/dL — AB (ref 65–99)
POTASSIUM: 4.7 mmol/L (ref 3.5–5.2)
SODIUM: 141 mmol/L (ref 134–144)

## 2016-08-05 ENCOUNTER — Encounter (HOSPITAL_BASED_OUTPATIENT_CLINIC_OR_DEPARTMENT_OTHER): Payer: Medicare Other | Attending: Internal Medicine

## 2016-08-05 DIAGNOSIS — E1121 Type 2 diabetes mellitus with diabetic nephropathy: Secondary | ICD-10-CM | POA: Diagnosis not present

## 2016-08-05 DIAGNOSIS — S91101A Unspecified open wound of right great toe without damage to nail, initial encounter: Secondary | ICD-10-CM | POA: Diagnosis not present

## 2016-08-05 DIAGNOSIS — M10071 Idiopathic gout, right ankle and foot: Secondary | ICD-10-CM | POA: Diagnosis not present

## 2016-08-05 DIAGNOSIS — S91302A Unspecified open wound, left foot, initial encounter: Secondary | ICD-10-CM | POA: Diagnosis not present

## 2016-08-05 DIAGNOSIS — S91301A Unspecified open wound, right foot, initial encounter: Secondary | ICD-10-CM | POA: Diagnosis not present

## 2016-08-05 DIAGNOSIS — L97422 Non-pressure chronic ulcer of left heel and midfoot with fat layer exposed: Secondary | ICD-10-CM | POA: Diagnosis not present

## 2016-08-16 ENCOUNTER — Ambulatory Visit: Payer: Medicare Other | Admitting: Pediatrics

## 2016-08-16 ENCOUNTER — Telehealth: Payer: Self-pay | Admitting: Pediatrics

## 2016-08-16 ENCOUNTER — Other Ambulatory Visit: Payer: Self-pay | Admitting: *Deleted

## 2016-08-16 MED ORDER — AMLODIPINE BESYLATE 5 MG PO TABS: 5.0000 mg | ORAL_TABLET | Freq: Every morning | ORAL | 3 refills | Status: DC

## 2016-08-16 NOTE — Telephone Encounter (Signed)
Not sure if should be on or not?

## 2016-08-16 NOTE — Progress Notes (Signed)
Order sent to mail order per pt request

## 2016-08-16 NOTE — Telephone Encounter (Signed)
What is the name of the medication? norvasc  Have you contacted your pharmacy to request a refill? No. Mail order  Which pharmacy would you like this sent to? [facility].   Patient notified that their request is being sent to the clinical staff for review and that they should receive a call once it is complete. If they do not receive a call within 24 hours they can check with their pharmacy or our office.

## 2016-08-16 NOTE — Telephone Encounter (Signed)
Pt notified RX sent in to mail order

## 2016-08-20 ENCOUNTER — Ambulatory Visit (INDEPENDENT_AMBULATORY_CARE_PROVIDER_SITE_OTHER): Payer: Medicare Other | Admitting: Pediatrics

## 2016-08-20 DIAGNOSIS — K3184 Gastroparesis: Secondary | ICD-10-CM | POA: Diagnosis not present

## 2016-08-20 DIAGNOSIS — I2510 Atherosclerotic heart disease of native coronary artery without angina pectoris: Secondary | ICD-10-CM | POA: Diagnosis not present

## 2016-08-20 DIAGNOSIS — E1143 Type 2 diabetes mellitus with diabetic autonomic (poly)neuropathy: Secondary | ICD-10-CM | POA: Diagnosis not present

## 2016-08-20 DIAGNOSIS — E1122 Type 2 diabetes mellitus with diabetic chronic kidney disease: Secondary | ICD-10-CM | POA: Diagnosis not present

## 2016-08-20 DIAGNOSIS — E11621 Type 2 diabetes mellitus with foot ulcer: Secondary | ICD-10-CM

## 2016-08-20 DIAGNOSIS — I130 Hypertensive heart and chronic kidney disease with heart failure and stage 1 through stage 4 chronic kidney disease, or unspecified chronic kidney disease: Secondary | ICD-10-CM | POA: Diagnosis not present

## 2016-08-20 DIAGNOSIS — N184 Chronic kidney disease, stage 4 (severe): Secondary | ICD-10-CM | POA: Diagnosis not present

## 2016-08-20 DIAGNOSIS — M10071 Idiopathic gout, right ankle and foot: Secondary | ICD-10-CM

## 2016-08-20 DIAGNOSIS — L97511 Non-pressure chronic ulcer of other part of right foot limited to breakdown of skin: Secondary | ICD-10-CM | POA: Diagnosis not present

## 2016-09-01 ENCOUNTER — Other Ambulatory Visit: Payer: Self-pay | Admitting: Pediatrics

## 2016-09-01 DIAGNOSIS — E034 Atrophy of thyroid (acquired): Secondary | ICD-10-CM

## 2016-09-02 ENCOUNTER — Encounter (HOSPITAL_BASED_OUTPATIENT_CLINIC_OR_DEPARTMENT_OTHER): Payer: Medicare Other | Attending: Internal Medicine

## 2016-09-02 DIAGNOSIS — Z872 Personal history of diseases of the skin and subcutaneous tissue: Secondary | ICD-10-CM | POA: Insufficient documentation

## 2016-09-02 DIAGNOSIS — Z09 Encounter for follow-up examination after completed treatment for conditions other than malignant neoplasm: Secondary | ICD-10-CM | POA: Diagnosis not present

## 2016-09-02 DIAGNOSIS — S91302D Unspecified open wound, left foot, subsequent encounter: Secondary | ICD-10-CM | POA: Diagnosis not present

## 2016-09-03 ENCOUNTER — Ambulatory Visit (INDEPENDENT_AMBULATORY_CARE_PROVIDER_SITE_OTHER): Payer: Medicare Other | Admitting: Pediatrics

## 2016-09-03 ENCOUNTER — Encounter: Payer: Self-pay | Admitting: Pediatrics

## 2016-09-03 VITALS — BP 127/59 | HR 82 | Temp 97.4°F | Ht 62.0 in | Wt 159.6 lb

## 2016-09-03 DIAGNOSIS — K219 Gastro-esophageal reflux disease without esophagitis: Secondary | ICD-10-CM | POA: Diagnosis not present

## 2016-09-03 DIAGNOSIS — E875 Hyperkalemia: Secondary | ICD-10-CM

## 2016-09-03 DIAGNOSIS — E034 Atrophy of thyroid (acquired): Secondary | ICD-10-CM | POA: Diagnosis not present

## 2016-09-03 DIAGNOSIS — I10 Essential (primary) hypertension: Secondary | ICD-10-CM

## 2016-09-03 DIAGNOSIS — Z794 Long term (current) use of insulin: Secondary | ICD-10-CM | POA: Diagnosis not present

## 2016-09-03 DIAGNOSIS — E1122 Type 2 diabetes mellitus with diabetic chronic kidney disease: Secondary | ICD-10-CM

## 2016-09-03 DIAGNOSIS — N184 Chronic kidney disease, stage 4 (severe): Secondary | ICD-10-CM

## 2016-09-03 LAB — BAYER DCA HB A1C WAIVED: HB A1C (BAYER DCA - WAIVED): 7.1 % — ABNORMAL HIGH (ref <7.0)

## 2016-09-03 MED ORDER — LEVOTHYROXINE SODIUM 75 MCG PO TABS: ORAL_TABLET | ORAL | 1 refills | Status: DC

## 2016-09-03 MED ORDER — AMLODIPINE BESYLATE 5 MG PO TABS: 5.0000 mg | ORAL_TABLET | Freq: Every day | ORAL | 1 refills | Status: DC

## 2016-09-03 MED ORDER — METOPROLOL SUCCINATE ER 50 MG PO TB24: 50.0000 mg | ORAL_TABLET | Freq: Every morning | ORAL | 1 refills | Status: DC

## 2016-09-03 MED ORDER — PANTOPRAZOLE SODIUM 20 MG PO TBEC: 20.0000 mg | DELAYED_RELEASE_TABLET | Freq: Every day | ORAL | 1 refills | Status: DC

## 2016-09-03 NOTE — Progress Notes (Signed)
  Subjective:   Patient ID: [name], female    DOB: 1946-02-25, 71 y.o.   MRN: [id number] CC: Follow-up (Diabetic)  HPI: [name] is a 71 y.o. female presenting for Follow-up (Diabetic)  HTN: 137/70 yesterday Rarely over 314 systolic at home Always elevated in the office Brought medications for review, only medications she is prescribed currently in her medication bag  Wounds on feet healed Released from wound clinic A few toe joints with tophi No open ulcers  4/24 has next rheum appt for gout On uloric  hypothyroidism: chronic, taking regularly  GERD: stable, has symptoms when she skips PPI  DM2: follows with endocrine, says BGLs at home have been in 100s  Relevant past medical, surgical, family and social history reviewed. Allergies and medications reviewed and updated. History  Smoking Status  . Never Smoker  Smokeless Tobacco  . Never Used   ROS: Per HPI   Objective:    BP (!) 127/59   Pulse 82   Temp 97.4 F (36.3 C) (Oral)   Ht '5\' 2"'$  (1.575 m)   Wt 159 lb 9.6 oz (72.4 kg)   BMI 29.19 kg/m   Wt Readings from Last 3 Encounters:  09/03/16 159 lb 9.6 oz (72.4 kg)  07/29/16 159 lb (72.1 kg)  07/29/16 158 lb (71.7 kg)    Gen: NAD, alert, cooperative with exam, NCAT EYES: EOMI, no conjunctival injection, or no icterus ENT: OP without erythema LYMPH: no cervical LAD CV: NRRR, normal S1/S2 Resp: CTABL, no wheezes, normal WOB Ext: No edema, warm Neuro: Alert and oriented Skin: several IP or DIP toe joints with tophi, no redness, no pain , no swelling Ulcers healed on feet  Assessment & Plan:  [name] was seen today for follow-up.  Diagnoses and all orders for this visit:  Type 2 diabetes mellitus with stage 4 chronic kidney disease, with long-term current use of insulin (HCC) A1c 7.1 Cont insulin, follow up with endo as scheduled -     BMP8+EGFR -     Bayer DCA Hb A1c Waived  Hyperkalemia Most likely due to losartan that pt had at home  that she was taking though had been removed discontinued due to CKD and removed from med list Repeat K f/u with nephrology -     BMP8+EGFR -     Bayer DCA Hb A1c Waived  Hypothyroidism due to acquired atrophy of thyroid Stable, cont below -     levothyroxine (SYNTHROID, LEVOTHROID) 75 MCG tablet; TAKE 1 TABLET EVERY DAY BEFORE BREAKFAST  Gastroesophageal reflux disease, esophagitis presence not specified Stable, cont ppi -     pantoprazole (PROTONIX) 20 MG tablet; Take 1 tablet (20 mg total) by mouth daily.  Essential hypertension Improved with recheck, adequate control, cont meds -     metoprolol succinate (TOPROL-XL) 50 MG 24 hr tablet; Take 1 tablet (50 mg total) by mouth every morning. Take with or immediately following a meal. -     amLODipine (NORVASC) 5 MG tablet; Take 1 tablet (5 mg total) by mouth daily.   Follow up plan: Return in about 3 months (around 12/03/2016). [name] Found, MD [place]

## 2016-09-04 LAB — BMP8+EGFR
BUN / CREAT RATIO: 17 (ref 12–28)
BUN: 23 mg/dL (ref 8–27)
CO2: 20 mmol/L (ref 18–29)
Calcium: 9.3 mg/dL (ref 8.7–10.3)
Chloride: 103 mmol/L (ref 96–106)
Creatinine, Ser: 1.35 mg/dL — ABNORMAL HIGH (ref 0.57–1.00)
GFR calc non Af Amer: 40 mL/min/{1.73_m2} — ABNORMAL LOW (ref >59)
GFR, EST AFRICAN AMERICAN: 46 mL/min/{1.73_m2} — AB (ref >59)
Glucose: 200 mg/dL — ABNORMAL HIGH (ref 65–99)
Potassium: 5.2 mmol/L (ref 3.5–5.2)
Sodium: 139 mmol/L (ref 134–144)

## 2016-09-20 ENCOUNTER — Other Ambulatory Visit: Payer: Self-pay

## 2016-09-20 ENCOUNTER — Encounter: Payer: Self-pay | Admitting: "Endocrinology

## 2016-09-20 ENCOUNTER — Ambulatory Visit (INDEPENDENT_AMBULATORY_CARE_PROVIDER_SITE_OTHER): Payer: Medicare Other | Admitting: "Endocrinology

## 2016-09-20 VITALS — BP 136/63 | HR 72 | Ht 62.0 in | Wt 159.0 lb

## 2016-09-20 DIAGNOSIS — E782 Mixed hyperlipidemia: Secondary | ICD-10-CM | POA: Diagnosis not present

## 2016-09-20 DIAGNOSIS — N184 Chronic kidney disease, stage 4 (severe): Secondary | ICD-10-CM | POA: Diagnosis not present

## 2016-09-20 DIAGNOSIS — Z794 Long term (current) use of insulin: Secondary | ICD-10-CM

## 2016-09-20 DIAGNOSIS — E1122 Type 2 diabetes mellitus with diabetic chronic kidney disease: Secondary | ICD-10-CM

## 2016-09-20 DIAGNOSIS — I10 Essential (primary) hypertension: Secondary | ICD-10-CM

## 2016-09-20 DIAGNOSIS — E039 Hypothyroidism, unspecified: Secondary | ICD-10-CM | POA: Diagnosis not present

## 2016-09-20 MED ORDER — INSULIN ASPART PROT & ASPART (70-30 MIX) 100 UNIT/ML PEN: 8.0000 [IU] | PEN_INJECTOR | Freq: Every day | SUBCUTANEOUS | 2 refills | Status: DC

## 2016-09-20 MED ORDER — GLUCOSE BLOOD VI STRP: ORAL_STRIP | 3 refills | Status: DC

## 2016-09-20 NOTE — Progress Notes (Signed)
Subjective:    Patient ID: [name], female    DOB: 01-17-1946. Patient is to follow-up for uncontrolled type 2 diabetes with her meter and logs.  Past Medical History:  Diagnosis Date  . Anal fistula   . Anemia in chronic renal disease    Aranesp injection --  when Hg <11, last injection 12-24-14.  Marland Kitchen Anxiety   . Arthritis    knees and hand/fingers. "broke back"-being evaluated for this"weakness left leg"  . 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 CKD (chronic kidney disease), stage III    nephrologist--  dr Mercy Moore-- LOV  07-09-2016  . Complication of anesthesia    post-op confusion   . Constipation   . Coronary artery disease   . Diabetic gastroparesis ([place])   . Diabetic retinopathy ([place])    bilateral --  monitored by dr [name]  . Diverticulosis of colon   . GAD (generalized anxiety disorder)   . GERD (gastroesophageal reflux disease)   . History of colon polyps    benign  . History of esophagitis   . History of GI bleed    upper 2009  due to esophagitis  &  2002  due to Mallory-Weiss tear  . 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 History of rectal abscess    12-29-2004  bedside I & D  . Hyperlipidemia   . Hypertension   . Hypothyroidism   . LAFB (left anterior fascicular block)   . Right bundle branch block   . Sacral decubitus ulcer    since 2014- 01-02-15 remains with wound" gauze dressing changes daily.  . Type II diabetes mellitus ([place])    Past Surgical History:  Procedure Laterality Date  . APPLICATION OF A-CELL OF EXTREMITY N/A 04/07/2015   Procedure: A CELL PLACMENT;  Surgeon: Loel Lofty Dillingham, DO;  Location: [facility];  Service: Plastics;  Laterality: N/A;  . CARDIOVASCULAR STRESS TEST  12-30-2004   normal perfusion study/  no ischemia or infartion/  normal LV wall function and wall motion , ef 66%  . CATARACT EXTRACTION W/ INTRAOCULAR LENS  IMPLANT, BILATERAL  1995  . COLONOSCOPY W/ POLYPECTOMY   last one 2008  . COMPRESSION HIP SCREW Right 05/18/2014   Procedure: COMPRESSION HIP;  Surgeon: Carole Civil, MD;  Location: [facility];  Service: Orthopedics;  Laterality: Right;  . ESOPHAGOGASTRODUODENOSCOPY  last one 01-09-2011  . EVALUATION UNDER ANESTHESIA WITH FISTULECTOMY N/A 01/06/2015   Procedure: EXAM UNDER ANESTHESIA , placement of seton;  Surgeon: Jackolyn Confer, MD;  Location: [facility];  Service: General;  Laterality: N/A;  . I&D EXTREMITY N/A 04/07/2015   Procedure: IRRIGATION AND DEBRIDEMENT ISCHIAL ULCER;  Surgeon: Loel Lofty Dillingham, DO;  Location: [facility];  Service: Plastics;  Laterality: N/A;  . INCISION AND DRAINAGE OF WOUND N/A 09/30/2014   Procedure: IRRIGATION AND DEBRIDEMENT SACRAL WOUND, EXCISION OF PERIRECTAL TRACT WITH PLACEMENT OF ACCELL;  Surgeon: Theodoro Kos, DO;  Location: [facility];  Service: Plastics;  Laterality: N/A;  . ORIF FEMUR FRACTURE Left 10/09/2012   Procedure: OPEN REDUCTION INTERNAL FIXATION (ORIF) DISTAL FEMUR FRACTURE;  Surgeon: Rozanna Box, MD;  Location: [facility];  Service: Orthopedics;  Laterality: Left;  . RETINOPATHY SURGERY Bilateral 1980's?  . TRANSTHORACIC ECHOCARDIOGRAM  02-18-2011   mild LVH,  ef 55-60%,  grade I diastolic dysfunction/  mild TR/  RV systolic pressure increased consistant with moderate pulmonary hypertension  Social History   Social History  . Marital status: Married    Spouse name: [name]  . Number of children: 0  . Years of education: 12   Occupational History  . Retired Retired   Social History Main Topics  . Smoking status: Never Smoker  . Smokeless tobacco: Never Used  . Alcohol use No  . Drug use: No  . Sexual activity: Not Currently   Other Topics Concern  . None   Social History Narrative   Lives with husband.    Caffeine use: none   Outpatient Encounter Prescriptions as of 09/20/2016  Medication Sig  . amLODipine (NORVASC) 5 MG tablet Take 1 tablet (5 mg total) by mouth daily.  .  Colchicine 0.6 MG CAPS Take by mouth as needed.   . febuxostat (ULORIC) 40 MG tablet Take 40 mg by mouth every morning.   . furosemide (LASIX) 40 MG tablet Take 40 mg by mouth.  Marland Kitchen glucose blood (ACCU-CHEK AVIVA) test strip Use 2 times daily to test glucose  . insulin aspart protamine - aspart (NOVOLOG MIX 70/30 FLEXPEN) (70-30) 100 UNIT/ML FlexPen Inject 0.08 mLs (8 Units total) into the skin daily with breakfast. Only if blood glucose is above 90  . levothyroxine (SYNTHROID, LEVOTHROID) 75 MCG tablet TAKE 1 TABLET EVERY DAY BEFORE BREAKFAST  . metoprolol succinate (TOPROL-XL) 50 MG 24 hr tablet Take 1 tablet (50 mg total) by mouth every morning. Take with or immediately following a meal.  . pantoprazole (PROTONIX) 20 MG tablet Take 1 tablet (20 mg total) by mouth daily.  . [DISCONTINUED] insulin aspart protamine - aspart (NOVOLOG MIX 70/30 FLEXPEN) (70-30) 100 UNIT/ML FlexPen Inject 0.08 mLs (8 Units total) into the skin daily with breakfast. Only if blood glucose is above 90   No facility-administered encounter medications on file as of 09/20/2016.    ALLERGIES: Allergies  Allergen Reactions  . Aspirin Nausea And Vomiting  . Ciprofloxacin Other (See Comments)    Upset Stomach  . Codeine Nausea And Vomiting    Makes me sick   . Losartan     Hyperkalemia   . Micardis [Telmisartan] Other (See Comments)    unknown  . Nexium [Esomeprazole Magnesium] Other (See Comments)    Causes internal bleeding  . Niaspan [Niacin Er] Other (See Comments)    Reaction is unknown  . Onglyza [Saxagliptin] Other (See Comments)    Reaction is unknown  . Other     No otc pain medications  . Rofecoxib Other (See Comments)    unknown  . Simvastatin   . Tequin [Gatifloxacin] Other (See Comments)    Reaction is unknown  . Welchol [Colesevelam Hcl]   . Amoxicillin Nausea And Vomiting and Rash    Has patient had a PCN reaction causing immediate rash, facial/tongue/throat swelling, SOB or lightheadedness  with hypotension: Yes Has patient had a PCN reaction causing severe rash involving mucus membranes or skin necrosis: no  Has patient had a PCN reaction that required hospitalization No Has patient had a PCN reaction occurring within the last 10 years: No If all of the above answers are "NO", then may proceed with Cephalosporin use.    VACCINATION STATUS: Immunization History  Administered Date(s) Administered  . Influenza, High Dose Seasonal PF 03/05/2016  . Influenza,inj,Quad PF,36+ Mos 02/16/2013, 02/27/2015  . Pneumococcal Conjugate-13 10/11/2014  . Pneumococcal Polysaccharide-23 10/10/2012  . Tdap 10/06/2012    Diabetes  She presents for her follow-up diabetic visit. She has type 2 diabetes mellitus. Onset  time: She was diagnosed at approximate age of 60 years . Her disease course has been stable. There are no hypoglycemic associated symptoms. Pertinent negatives for hypoglycemia include no confusion, headaches, pallor or seizures. Associated symptoms include fatigue and visual change. Pertinent negatives for diabetes include no chest pain, no polydipsia and no polyphagia. There are no hypoglycemic complications. Symptoms are stable. Diabetic complications include nephropathy, peripheral neuropathy, PVD and retinopathy. Risk factors for coronary artery disease include diabetes mellitus, dyslipidemia, hypertension and sedentary lifestyle. Current diabetic treatment includes oral agent (monotherapy). She is compliant with treatment most of the time. Weight trend: Wheelchair-bound. She is following a generally unhealthy diet. When asked about meal planning, she reported none. She has not had a previous visit with a dietitian. She never participates in exercise. Her home blood glucose trend is increasing steadily (Her glucose profile today is acceptable between 100- 180 mg per DL average.). Her breakfast blood glucose range is generally 140-180 mg/dl. Her dinner blood glucose range is generally  140-180 mg/dl. Her overall blood glucose range is 140-180 mg/dl. An ACE inhibitor/angiotensin II receptor blocker is being taken. Eye exam is current.  Thyroid Problem  Presents for initial visit. Onset time: 15 years. Symptoms include fatigue and visual change. Patient reports no cold intolerance, diarrhea, heat intolerance or palpitations. The symptoms have been stable. Past treatments include levothyroxine. The following procedures have not been performed: thyroidectomy.  Hypertension  This is a chronic problem. The current episode started more than 1 year ago. Pertinent negatives include no chest pain, headaches, palpitations or shortness of breath. Past treatments include angiotensin blockers. Hypertensive end-organ damage includes PVD and retinopathy. Identifiable causes of hypertension include a thyroid problem.      Review of Systems  Constitutional: Positive for fatigue. Negative for unexpected weight change.  HENT: Negative for trouble swallowing and voice change.   Eyes: Negative for visual disturbance.  Respiratory: Negative for cough, shortness of breath and wheezing.   Cardiovascular: Negative for chest pain, palpitations and leg swelling.  Gastrointestinal: Negative for diarrhea, nausea and vomiting.  Endocrine: Negative for cold intolerance, heat intolerance, polydipsia and polyphagia.  Musculoskeletal: Positive for arthralgias, back pain, gait problem, joint swelling and myalgias.          Skin: Negative for color change, pallor, rash and wound.  Neurological: Negative for seizures and headaches.  Psychiatric/Behavioral: Negative for confusion and suicidal ideas.    Objective:    BP 136/63   Pulse 72   Ht '5\' 2"'  (1.575 m)   Wt 159 lb (72.1 kg)   BMI 29.08 kg/m   Wt Readings from Last 3 Encounters:  09/20/16 159 lb (72.1 kg)  09/03/16 159 lb 9.6 oz (72.4 kg)  07/29/16 159 lb (72.1 kg)    Physical Exam  Constitutional: She is oriented to person, place, and time.  She appears well-developed.  HENT:  Head: Normocephalic and atraumatic.  Eyes: EOM are normal.  Neck: Normal range of motion. Neck supple. No tracheal deviation present. No thyromegaly present.  Cardiovascular: Normal rate and regular rhythm.  Exam reveals decreased pulses.   Pulses:      Dorsalis pedis pulses are 0 on the right side, and 0 on the left side.       Posterior tibial pulses are 0 on the right side, and 0 on the left side.  Pulmonary/Chest: Effort normal and breath sounds normal.  Abdominal: Soft. Bowel sounds are normal. There is no tenderness. There is no guarding.  Musculoskeletal: She exhibits no edema.  Arms: She is wheelchair-bound due to recent hip fracture and diffuse debilitating arthritis.  Neurological: She is alert and oriented to person, place, and time. She has normal reflexes. A sensory deficit is present. No cranial nerve deficit. Coordination normal.  Skin: Skin is warm and dry. No rash noted. No erythema. No pallor.  Psychiatric: She has a normal mood and affect. Judgment normal.    Results for orders placed or performed in visit on 09/03/16  [facility]  Result Value Ref Range   Glucose 200 (H) 65 - 99 mg/dL   BUN 23 8 - 27 mg/dL   Creatinine, Ser 1.35 (H) 0.57 - 1.00 mg/dL   GFR calc non Af Amer 40 (L) >59 mL/min/1.73   GFR calc Af Amer 46 (L) >59 mL/min/1.73   BUN/Creatinine Ratio 17 12 - 28   Sodium 139 134 - 144 mmol/L   Potassium 5.2 3.5 - 5.2 mmol/L   Chloride 103 96 - 106 mmol/L   CO2 20 18 - 29 mmol/L   Calcium 9.3 8.7 - 10.3 mg/dL  Bayer DCA Hb A1c Waived  Result Value Ref Range   Bayer DCA Hb A1c Waived 7.1 (H) <7.0 %   Diabetic Labs (most recent): Lab Results  Component Value Date   HGBA1C 6.6 (H) 05/05/2016   HGBA1C 7.3 (H) 11/21/2015   HGBA1C 7.6 07/09/2015   Lipid Panel     Component Value Date/Time   CHOL 159 03/05/2016 0904   CHOL 105 09/11/2012 1057   TRIG 157 (H) 03/05/2016 0904   TRIG 170 (H) 10/11/2014 1159    TRIG 79 09/11/2012 1057   HDL 33 (L) 03/05/2016 0904   HDL 53 10/11/2014 1159   HDL 41 09/11/2012 1057   CHOLHDL 4.8 (H) 03/05/2016 0904   LDLCALC 95 03/05/2016 0904   LDLCALC 50 02/14/2014 1012   LDLCALC 48 09/11/2012 1057     Assessment & Plan:   1. Type 2 diabetes mellitus with stage 4 chronic kidney disease, without long-term current use of insulin ([place])   - Patient has currently uncontrolled symptomatic type 2 DM since  71 years of age,  with most recent A1c of 7.1% , generally improving from  10 %. Recent labs reviewed. - Her renal function is improving.   -her  diabetes is complicated by PAD, CKD, neuropathy and patient remains at a high risk for more acute and chronic complications of diabetes which include CAD, CVA, CKD, retinopathy, and neuropathy. These are all discussed in detail with the patient.  - I have counseled the patient on diet management  by adopting a carbohydrate restricted/protein rich diet.  - Suggestion is made for patient to avoid simple carbohydrates   from their diet including Cakes , Desserts, Ice Cream,  Soda (  diet and regular) , Sweet Tea , Candies,  Chips, Cookies, Artificial Sweeteners,   and "Sugar-free" Products . This will help patient to have stable blood glucose profile and potentially avoid unintended weight gain.  - I encouraged the patient to switch to  unprocessed or minimally processed complex starch and increased protein intake (animal or plant source), fruits, and vegetables.  - Patient is advised to stick to a routine mealtimes to eat 3 meals  a day and avoid unnecessary snacks ( to snack only to correct hypoglycemia).  - The patient will be scheduled with Jearld Fenton, RDN, CDE for individualized DM education.  - I have approached patient with the following individualized plan to manage diabetes and patient agrees:   -  She came with better and safer blood glucose profile .  -Her husband continues to offer to help. I will continue  NovoLog 70/30   8 units only with breakfast for pre-meal glucose above 90 mg/dL associated with monitoring of blood glucose  before meals and at bedtime. - After she finishes her current supply of NovoLog 70/30, she will be switched to basal insulin covered by her insurance, her choices include Lantus, Levemir, [name], and Toujeo.  - First priority in this patient would be to avoid hypoglycemia.  if she cannot perform this therapy even with the help of her husband, her best option is placement in skilled care facility.  -Patient is encouraged to call clinic for blood glucose levels less than 70 or above 300 mg /dl.  -Patient is not a candidate for MTF, Incretin therapy.   - Patient specific target  A1c;  LDL, HDL, Triglycerides, and  Waist Circumference were discussed in detail.  2) BP/HTN: Controlled. Continue current medications including ACEI/ARB. 3) Lipids/HPL:  continue statins. 4)  Weight/Diet: CDE Consult has been initiated, she has limited ability to exercise.  5)  Hypothyroidism:  - Her recent thyroid function tests are consistent with appropriate replacement and I will continue levothyroxine  75 mcg po qam.  - We discussed about correct intake of levothyroxine, at fasting, with water, separated by at least 30 minutes from breakfast, and separated by more than 4 hours from calcium, iron, multivitamins, acid reflux medications (PPIs). -Patient is made aware of the fact that thyroid hormone replacement is needed for life, dose to be adjusted by periodic monitoring of thyroid function tests.  5) Chronic Care/Health Maintenance:  -Patient is  on ACEI/ARB and Statin medications and encouraged to continue to follow up with Ophthalmology, Podiatrist at least yearly or according to recommendations, and advised to   stay away from smoking. I have recommended yearly flu vaccine and pneumonia vaccination at least every 5 years; moderate intensity exercise for up to 150 minutes weekly; and   sleep for at least 7 hours a day.  - 30 minutes of time was spent on the care of this patient , 50% of which was applied for counseling on diabetes complications and their preventions.  - Patient to bring meter and  blood glucose logs during their next visit in 2 weeks.   - I advised patient to maintain close follow up with Eustaquio Maize, MD for primary care needs.  Follow up plan: - Return in about 3 months (around 12/20/2016) for meter, and logs.  Glade Lloyd, MD Phone: [phone]  Fax: [phone]   09/20/2016, 10:58 AM

## 2016-09-22 ENCOUNTER — Other Ambulatory Visit: Payer: Self-pay

## 2016-09-22 MED ORDER — GLUCOSE BLOOD VI STRP: ORAL_STRIP | 5 refills | Status: DC

## 2016-10-14 ENCOUNTER — Encounter (HOSPITAL_BASED_OUTPATIENT_CLINIC_OR_DEPARTMENT_OTHER): Payer: Self-pay | Admitting: *Deleted

## 2016-10-14 DIAGNOSIS — M1A09X1 Idiopathic chronic gout, multiple sites, with tophus (tophi): Secondary | ICD-10-CM | POA: Diagnosis not present

## 2016-10-14 DIAGNOSIS — Z79899 Other long term (current) drug therapy: Secondary | ICD-10-CM | POA: Diagnosis not present

## 2016-10-14 DIAGNOSIS — Z6829 Body mass index (BMI) 29.0-29.9, adult: Secondary | ICD-10-CM | POA: Diagnosis not present

## 2016-10-14 DIAGNOSIS — E663 Overweight: Secondary | ICD-10-CM | POA: Diagnosis not present

## 2016-10-14 DIAGNOSIS — M2550 Pain in unspecified joint: Secondary | ICD-10-CM | POA: Diagnosis not present

## 2016-10-14 DIAGNOSIS — N183 Chronic kidney disease, stage 3 (moderate): Secondary | ICD-10-CM | POA: Diagnosis not present

## 2016-10-14 NOTE — Progress Notes (Addendum)
NPO AFTER MN.  ARRIVE AT 0600.  PT GETTING LAB WORK DONE AT DR MATTINGLY'S OFFICE (NEPHROLOGIST) ON 10-21-2016 AT 1275.  CURRENT EKG IN CHART AND EPIC.  WILL TAKE AM MEDS DOS W/ SIPS OF WATER WITH EXCEPTION NO INSULIN OR LASIX.  CALLED AND LM VIA PHONE [phone]) WITH DR [place] TO FAX LAB RESULTS NEXT Thursday 10-21-2016 AND REQUEST SHE CALL BACK TO CONFIRM MESSAGE RECEIVED.   REFERRED TO PT'S PCP NOTES 07-29-2016 DAY LAST SURGERY CANCELLED. PT WAS TREATED W/ KAYEXELATE AND K+ DOWN TO 5.2 ON 08-24-2016.  PER PCP NOTE 08-24-2016 FOUND PT WAS STILL TAKING LOSARTAN, WHICH HAD BEEN DISCONTINUED SOME TIME AGO DUE TO CKD, AND THIS WAS CAUSING HYPERKALEMIA.    ADDENDUM:  RECEIVED VOICEMAIL MESSAGE FROM DR MATTTINGLY'S ASSISTANT, MARCIE (ext [phone]) CONFIRMING [place] RESULTS FAXED.  ADDENDUM:  10-22-2014 AR 1615 RECEIVED CM[facility] LAB RESULT FROM DR MATTINGLY'S OFFICE.  PT'S POTASSIUM LEVEL IS 4.4.  CALLLED AND SPOKE W/ [facility] RN AT DR Marcello Moores OFFICE TO LET HER KNOW.

## 2016-10-15 DIAGNOSIS — N183 Chronic kidney disease, stage 3 (moderate): Secondary | ICD-10-CM | POA: Diagnosis not present

## 2016-10-15 DIAGNOSIS — E1122 Type 2 diabetes mellitus with diabetic chronic kidney disease: Secondary | ICD-10-CM | POA: Diagnosis not present

## 2016-10-15 DIAGNOSIS — N184 Chronic kidney disease, stage 4 (severe): Secondary | ICD-10-CM | POA: Diagnosis not present

## 2016-10-15 DIAGNOSIS — I129 Hypertensive chronic kidney disease with stage 1 through stage 4 chronic kidney disease, or unspecified chronic kidney disease: Secondary | ICD-10-CM | POA: Diagnosis not present

## 2016-10-15 DIAGNOSIS — N2581 Secondary hyperparathyroidism of renal origin: Secondary | ICD-10-CM | POA: Diagnosis not present

## 2016-10-15 DIAGNOSIS — D631 Anemia in chronic kidney disease: Secondary | ICD-10-CM | POA: Diagnosis not present

## 2016-10-15 DIAGNOSIS — R809 Proteinuria, unspecified: Secondary | ICD-10-CM | POA: Diagnosis not present

## 2016-10-19 ENCOUNTER — Other Ambulatory Visit: Payer: Self-pay | Admitting: General Surgery

## 2016-10-21 DIAGNOSIS — N183 Chronic kidney disease, stage 3 (moderate): Secondary | ICD-10-CM | POA: Diagnosis not present

## 2016-10-22 ENCOUNTER — Ambulatory Visit (HOSPITAL_BASED_OUTPATIENT_CLINIC_OR_DEPARTMENT_OTHER): Payer: Medicare Other | Admitting: Certified Registered"

## 2016-10-22 ENCOUNTER — Encounter (HOSPITAL_BASED_OUTPATIENT_CLINIC_OR_DEPARTMENT_OTHER)
Admission: RE | Disposition: A | Discharge status: Home / Self Care | Payer: Self-pay | Source: Ambulatory Visit | Attending: General Surgery

## 2016-10-22 ENCOUNTER — Encounter (HOSPITAL_BASED_OUTPATIENT_CLINIC_OR_DEPARTMENT_OTHER): Payer: Self-pay | Admitting: *Deleted

## 2016-10-22 ENCOUNTER — Ambulatory Visit (HOSPITAL_BASED_OUTPATIENT_CLINIC_OR_DEPARTMENT_OTHER)
Admission: RE | Admit: 2016-10-22 | Discharge: 2016-10-22 | Disposition: A | Discharge status: Home / Self Care | Payer: Medicare Other | Source: Ambulatory Visit | Attending: General Surgery | Admitting: General Surgery

## 2016-10-22 DIAGNOSIS — I129 Hypertensive chronic kidney disease with stage 1 through stage 4 chronic kidney disease, or unspecified chronic kidney disease: Secondary | ICD-10-CM | POA: Diagnosis not present

## 2016-10-22 DIAGNOSIS — E1143 Type 2 diabetes mellitus with diabetic autonomic (poly)neuropathy: Secondary | ICD-10-CM | POA: Insufficient documentation

## 2016-10-22 DIAGNOSIS — E11319 Type 2 diabetes mellitus with unspecified diabetic retinopathy without macular edema: Secondary | ICD-10-CM | POA: Diagnosis not present

## 2016-10-22 DIAGNOSIS — E785 Hyperlipidemia, unspecified: Secondary | ICD-10-CM | POA: Diagnosis not present

## 2016-10-22 DIAGNOSIS — E1122 Type 2 diabetes mellitus with diabetic chronic kidney disease: Secondary | ICD-10-CM | POA: Insufficient documentation

## 2016-10-22 DIAGNOSIS — K219 Gastro-esophageal reflux disease without esophagitis: Secondary | ICD-10-CM | POA: Diagnosis not present

## 2016-10-22 DIAGNOSIS — Z79899 Other long term (current) drug therapy: Secondary | ICD-10-CM | POA: Diagnosis not present

## 2016-10-22 DIAGNOSIS — Z794 Long term (current) use of insulin: Secondary | ICD-10-CM | POA: Insufficient documentation

## 2016-10-22 DIAGNOSIS — E039 Hypothyroidism, unspecified: Secondary | ICD-10-CM | POA: Diagnosis not present

## 2016-10-22 DIAGNOSIS — M109 Gout, unspecified: Secondary | ICD-10-CM | POA: Diagnosis not present

## 2016-10-22 DIAGNOSIS — N183 Chronic kidney disease, stage 3 (moderate): Secondary | ICD-10-CM | POA: Insufficient documentation

## 2016-10-22 DIAGNOSIS — K603 Anal fistula: Secondary | ICD-10-CM | POA: Insufficient documentation

## 2016-10-22 DIAGNOSIS — I2510 Atherosclerotic heart disease of native coronary artery without angina pectoris: Secondary | ICD-10-CM | POA: Insufficient documentation

## 2016-10-22 DIAGNOSIS — D631 Anemia in chronic kidney disease: Secondary | ICD-10-CM | POA: Insufficient documentation

## 2016-10-22 DIAGNOSIS — Z880 Allergy status to penicillin: Secondary | ICD-10-CM | POA: Diagnosis not present

## 2016-10-22 DIAGNOSIS — F419 Anxiety disorder, unspecified: Secondary | ICD-10-CM | POA: Diagnosis not present

## 2016-10-22 HISTORY — PX: LIGATION OF INTERNAL FISTULA TRACT: SHX6551

## 2016-10-22 HISTORY — DX: Personal history of other endocrine, nutritional and metabolic disease: Z86.39

## 2016-10-22 LAB — GLUCOSE, CAPILLARY
Glucose-Capillary: 225 mg/dL — ABNORMAL HIGH (ref 65–99)
Glucose-Capillary: 144 mg/dL — ABNORMAL HIGH (ref 65–99)

## 2016-10-22 SURGERY — LIGATION, INTERNAL FISTULA TRACT
Anesthesia: General

## 2016-10-22 MED ORDER — INSULIN ASPART 100 UNIT/ML ~~LOC~~ SOLN
5.0000 [IU] | Freq: Once | SUBCUTANEOUS | Status: AC
  Administered 2016-10-22: 5 [IU] via SUBCUTANEOUS
  Filled 2016-10-22: qty 0.05

## 2016-10-22 MED ORDER — ONDANSETRON HCL 4 MG/2ML IJ SOLN
INTRAMUSCULAR | Status: DC | PRN
  Administered 2016-10-22: 4 mg via INTRAVENOUS

## 2016-10-22 MED ORDER — EPHEDRINE SULFATE-NACL 50-0.9 MG/10ML-% IV SOSY
PREFILLED_SYRINGE | INTRAVENOUS | Status: DC | PRN
Start: 2016-10-22 — End: 2016-10-22
  Administered 2016-10-22: 10 mg via INTRAVENOUS

## 2016-10-22 MED ORDER — FENTANYL CITRATE (PF) 100 MCG/2ML IJ SOLN
INTRAMUSCULAR | Status: DC | PRN
  Administered 2016-10-22: 50 ug via INTRAVENOUS

## 2016-10-22 MED ORDER — LIDOCAINE 5 % EX OINT
TOPICAL_OINTMENT | CUTANEOUS | Status: DC | PRN
Start: 2016-10-22 — End: 2016-10-22
  Administered 2016-10-22: 1

## 2016-10-22 MED ORDER — ROCURONIUM BROMIDE 50 MG/5ML IV SOSY
PREFILLED_SYRINGE | INTRAVENOUS | Status: AC
Start: 2016-10-22 — End: 2016-10-22
  Filled 2016-10-22: qty 5

## 2016-10-22 MED ORDER — ACETAMINOPHEN 325 MG PO TABS
650.0000 mg | ORAL_TABLET | ORAL | Status: DC | PRN
  Filled 2016-10-22: qty 2

## 2016-10-22 MED ORDER — BUPIVACAINE LIPOSOME 1.3 % IJ SUSP
INTRAMUSCULAR | Status: DC | PRN
  Administered 2016-10-22: 20 mL

## 2016-10-22 MED ORDER — SODIUM CHLORIDE 0.9 % IV SOLN
INTRAVENOUS | Status: DC
  Administered 2016-10-22 (×2): via INTRAVENOUS
  Filled 2016-10-22: qty 1000

## 2016-10-22 MED ORDER — TRAMADOL HCL 50 MG PO TABS: 50.0000 mg | ORAL_TABLET | Freq: Four times a day (QID) | ORAL | 0 refills | Status: DC | PRN

## 2016-10-22 MED ORDER — PROPOFOL 10 MG/ML IV BOLUS
INTRAVENOUS | Status: AC
  Filled 2016-10-22: qty 20

## 2016-10-22 MED ORDER — DEXAMETHASONE SODIUM PHOSPHATE 10 MG/ML IJ SOLN
INTRAMUSCULAR | Status: AC
  Filled 2016-10-22: qty 1

## 2016-10-22 MED ORDER — INSULIN ASPART 100 UNIT/ML ~~LOC~~ SOLN
SUBCUTANEOUS | Status: AC
  Filled 2016-10-22: qty 1

## 2016-10-22 MED ORDER — DEXAMETHASONE SODIUM PHOSPHATE 10 MG/ML IJ SOLN
INTRAMUSCULAR | Status: DC | PRN
  Administered 2016-10-22: 10 mg via INTRAVENOUS

## 2016-10-22 MED ORDER — SODIUM CHLORIDE 0.9 % IV SOLN
250.0000 mL | INTRAVENOUS | Status: DC | PRN
  Filled 2016-10-22: qty 250

## 2016-10-22 MED ORDER — SODIUM CHLORIDE 0.9% FLUSH
3.0000 mL | INTRAVENOUS | Status: DC | PRN
  Filled 2016-10-22: qty 3

## 2016-10-22 MED ORDER — SUGAMMADEX SODIUM 200 MG/2ML IV SOLN
INTRAVENOUS | Status: AC
  Filled 2016-10-22: qty 2

## 2016-10-22 MED ORDER — ONDANSETRON HCL 4 MG/2ML IJ SOLN
INTRAMUSCULAR | Status: AC
  Filled 2016-10-22: qty 2

## 2016-10-22 MED ORDER — FENTANYL CITRATE (PF) 100 MCG/2ML IJ SOLN
INTRAMUSCULAR | Status: AC
  Filled 2016-10-22: qty 2

## 2016-10-22 MED ORDER — SODIUM CHLORIDE 0.9% FLUSH
3.0000 mL | Freq: Two times a day (BID) | INTRAVENOUS | Status: DC
  Filled 2016-10-22: qty 3

## 2016-10-22 MED ORDER — SUGAMMADEX SODIUM 200 MG/2ML IV SOLN
INTRAVENOUS | Status: DC | PRN
  Administered 2016-10-22: 150 mg via INTRAVENOUS

## 2016-10-22 MED ORDER — BUPIVACAINE-EPINEPHRINE 0.5% -1:200000 IJ SOLN
INTRAMUSCULAR | Status: DC | PRN
  Administered 2016-10-22: 30 mL

## 2016-10-22 MED ORDER — FENTANYL CITRATE (PF) 100 MCG/2ML IJ SOLN
25.0000 ug | INTRAMUSCULAR | Status: DC | PRN
  Filled 2016-10-22: qty 1

## 2016-10-22 MED ORDER — ACETAMINOPHEN 650 MG RE SUPP
650.0000 mg | RECTAL | Status: DC | PRN
  Filled 2016-10-22: qty 1

## 2016-10-22 MED ORDER — LIDOCAINE 2% (20 MG/ML) 5 ML SYRINGE
INTRAMUSCULAR | Status: DC | PRN
  Administered 2016-10-22: 50 mg via INTRAVENOUS

## 2016-10-22 MED ORDER — EPHEDRINE 5 MG/ML INJ
INTRAVENOUS | Status: AC
  Filled 2016-10-22: qty 10

## 2016-10-22 MED ORDER — PROPOFOL 10 MG/ML IV BOLUS
INTRAVENOUS | Status: DC | PRN
  Administered 2016-10-22: 150 mg via INTRAVENOUS

## 2016-10-22 MED ORDER — ROCURONIUM BROMIDE 50 MG/5ML IV SOSY
PREFILLED_SYRINGE | INTRAVENOUS | Status: DC | PRN
  Administered 2016-10-22: 40 mg via INTRAVENOUS

## 2016-10-22 MED ORDER — MEPERIDINE HCL 25 MG/ML IJ SOLN
6.2500 mg | INTRAMUSCULAR | Status: DC | PRN
  Filled 2016-10-22: qty 1

## 2016-10-22 MED ORDER — LIDOCAINE 2% (20 MG/ML) 5 ML SYRINGE
INTRAMUSCULAR | Status: AC
  Filled 2016-10-22: qty 5

## 2016-10-22 MED ORDER — METOCLOPRAMIDE HCL 5 MG/ML IJ SOLN
10.0000 mg | Freq: Once | INTRAMUSCULAR | Status: DC | PRN
  Filled 2016-10-22: qty 2

## 2016-10-22 SURGICAL SUPPLY — 59 items
BENZOIN TINCTURE PRP APPL 2/3 (GAUZE/BANDAGES/DRESSINGS) ×3 IMPLANT
BLADE HEX COATED 2.75 (ELECTRODE) ×3 IMPLANT
BLADE SURG 10 STRL SS (BLADE) IMPLANT
BLADE SURG 15 STRL LF DISP TIS (BLADE) ×1 IMPLANT
BLADE SURG 15 STRL SS (BLADE) ×2
BRIEF STRETCH FOR OB PAD LRG (UNDERPADS AND DIAPERS) ×3 IMPLANT
CANISTER SUCT 3000ML PPV (MISCELLANEOUS) ×3 IMPLANT
COVER BACK TABLE 60X90IN (DRAPES) ×3 IMPLANT
COVER MAYO STAND STRL (DRAPES) ×3 IMPLANT
DECANTER SPIKE VIAL GLASS SM (MISCELLANEOUS) IMPLANT
DRAPE LAPAROTOMY 100X72 PEDS (DRAPES) ×3 IMPLANT
DRAPE UTILITY XL STRL (DRAPES) ×3 IMPLANT
DRSG PAD ABDOMINAL 8X10 ST (GAUZE/BANDAGES/DRESSINGS) ×3 IMPLANT
ELECT BLADE 6.5 .24CM SHAFT (ELECTRODE) IMPLANT
ELECT REM PT RETURN 9FT ADLT (ELECTROSURGICAL) ×3
ELECTRODE REM PT RTRN 9FT ADLT (ELECTROSURGICAL) ×1 IMPLANT
GAUZE SPONGE 4X4 12PLY STRL LF (GAUZE/BANDAGES/DRESSINGS) ×3 IMPLANT
GAUZE SPONGE 4X4 16PLY XRAY LF (GAUZE/BANDAGES/DRESSINGS) IMPLANT
GAUZE VASELINE 3X9 (GAUZE/BANDAGES/DRESSINGS) IMPLANT
GLOVE BIO SURGEON STRL SZ 6.5 (GLOVE) ×2 IMPLANT
GLOVE BIO SURGEONS STRL SZ 6.5 (GLOVE) ×1
GLOVE INDICATOR 7.0 STRL GRN (GLOVE) ×3 IMPLANT
GOWN STRL REUS W/ TWL LRG LVL3 (GOWN DISPOSABLE) ×1 IMPLANT
GOWN STRL REUS W/ TWL XL LVL3 (GOWN DISPOSABLE) ×2 IMPLANT
GOWN STRL REUS W/TWL LRG LVL3 (GOWN DISPOSABLE) ×2
GOWN STRL REUS W/TWL XL LVL3 (GOWN DISPOSABLE) ×4
HYDROGEN PEROXIDE 16OZ (MISCELLANEOUS) ×3 IMPLANT
IV CATH 18G SAFETY (IV SOLUTION) ×3 IMPLANT
KIT RM TURNOVER CYSTO AR (KITS) ×3 IMPLANT
LOOP VESSEL MAXI BLUE (MISCELLANEOUS) IMPLANT
MANIFOLD NEPTUNE II (INSTRUMENTS) IMPLANT
NEEDLE HYPO 25X1 1.5 SAFETY (NEEDLE) ×3 IMPLANT
NS IRRIG 500ML POUR BTL (IV SOLUTION) ×3 IMPLANT
PACK BASIN DAY SURGERY FS (CUSTOM PROCEDURE TRAY) ×3 IMPLANT
PAD ABD 8X10 STRL (GAUZE/BANDAGES/DRESSINGS) ×3 IMPLANT
PAD ARMBOARD 7.5X6 YLW CONV (MISCELLANEOUS) ×3 IMPLANT
PENCIL BUTTON HOLSTER BLD 10FT (ELECTRODE) ×3 IMPLANT
RETRACTOR STAY HOOK 5MM (MISCELLANEOUS) ×3 IMPLANT
RETRACTOR STERILE 25.8CMX11.3 (INSTRUMENTS) ×3 IMPLANT
SPONGE GAUZE 4X4 12PLY STER LF (GAUZE/BANDAGES/DRESSINGS) ×3 IMPLANT
SPONGE SURGIFOAM ABS GEL 12-7 (HEMOSTASIS) IMPLANT
SUCTION FRAZIER HANDLE 10FR (MISCELLANEOUS) ×2
SUCTION TUBE FRAZIER 10FR DISP (MISCELLANEOUS) ×1 IMPLANT
SUT CHROMIC 2 0 SH (SUTURE) ×3 IMPLANT
SUT CHROMIC 3 0 SH 27 (SUTURE) IMPLANT
SUT ETHIBOND 0 (SUTURE) IMPLANT
SUT SILK 2 0 (SUTURE)
SUT SILK 2-0 18XBRD TIE 12 (SUTURE) IMPLANT
SUT SILK 3 0 SH 30 (SUTURE) IMPLANT
SUT VIC AB 2-0 SH 18 (SUTURE) ×3 IMPLANT
SUT VIC AB 3-0 SH 8-18 (SUTURE) ×3 IMPLANT
SUT VICRYL 3 0 UR 6 27 (SUTURE) IMPLANT
SYR BULB IRRIGATION 50ML (SYRINGE) ×3 IMPLANT
SYR CONTROL 10ML LL (SYRINGE) ×3 IMPLANT
TOWEL OR 17X24 6PK STRL BLUE (TOWEL DISPOSABLE) ×3 IMPLANT
TRAY DSU PREP LF (CUSTOM PROCEDURE TRAY) ×3 IMPLANT
TUBE CONNECTING 12'X1/4 (SUCTIONS) ×1
TUBE CONNECTING 12X1/4 (SUCTIONS) ×2 IMPLANT
YANKAUER SUCT BULB TIP NO VENT (SUCTIONS) ×3 IMPLANT

## 2016-10-22 NOTE — H&P (Signed)
HPI: 71 y.o.F with chronic wound being treated by the wound clinic. A fistula was noted and a seton was placed through this. The wound has since healed around the fistula.       Past Medical History:  Diagnosis Date  . Anemia in chronic renal disease    Aranesp injection -- when Hg <11, last injection 12-24-14.  Marland Kitchen Anxiety   . Arthritis    knees and hand/fingers. "broke back"-being evaluated for this"weakness left leg"  . CKD (chronic kidney disease), stage III    nephrologist-- dr Mercy Moore  . Complication of anesthesia    post-op confusion   . Constipation   . Coronary artery disease   . Diabetic gastroparesis ([place])   . Diabetic retinopathy ([place])    bilateral -- monitored by dr [name]  . Diverticulosis of colon   . GAD (generalized anxiety disorder)   . GERD (gastroesophageal reflux disease)   . History of colon polyps    benign  . History of esophagitis   . History of GI bleed    upper 2009 due to esophagitis &2002 due to Mallory-Weiss tear  . History of gout    in issues for several years  . History of Mallory-Weiss syndrome    12/ 2002-- resolved  . History of rectal abscess    12-29-2004 bedside I &D  . Hyperlipidemia   . Hypertension   . Hypothyroidism   . LAFB (left anterior fascicular block)   . Right bundle branch block   . Sacral decubitus ulcer    since 2014- 01-02-15 remains with wound" gauze dressing changes daily.  . Type II diabetes mellitus ([place])          Past Surgical History:  Procedure Laterality Date  . APPLICATION OF A-CELL OF EXTREMITY N/A 04/07/2015   Procedure: A CELL PLACMENT; Surgeon: Loel Lofty Dillingham, DO; Location: [facility]; Service: Plastics; Laterality: N/A;  . CARDIOVASCULAR STRESS TEST  12-30-2004   normal perfusion study/ no ischemia or infartion/ normal LV wall function and wall motion , ef 66%  . CATARACT EXTRACTION W/ INTRAOCULAR LENS IMPLANT, BILATERAL  1995  .  COLONOSCOPY W/ POLYPECTOMY  last one 2008  . COMPRESSION HIP SCREW Right 05/18/2014   Procedure: COMPRESSION HIP; Surgeon: Carole Civil, MD; Location: [facility]; Service: Orthopedics; Laterality: Right;  . ESOPHAGOGASTRODUODENOSCOPY  last one 01-09-2011  . EVALUATION UNDER ANESTHESIA WITH FISTULECTOMY N/A 01/06/2015   Procedure: EXAM UNDER ANESTHESIA , placement of seton; Surgeon: Jackolyn Confer, MD; Location: [facility]; Service: General; Laterality: N/A;  . I&D EXTREMITY N/A 04/07/2015   Procedure: IRRIGATION AND DEBRIDEMENT ISCHIAL ULCER; Surgeon: Loel Lofty Dillingham, DO; Location: [facility]; Service: Plastics; Laterality: N/A;  . INCISION AND DRAINAGE OF WOUND N/A 09/30/2014   Procedure: IRRIGATION AND DEBRIDEMENT SACRAL WOUND, EXCISION OF PERIRECTAL TRACT WITH PLACEMENT OF ACCELL; Surgeon: Theodoro Kos, DO; Location: [facility]; Service: Plastics; Laterality: N/A;  . ORIF FEMUR FRACTURE Left 10/09/2012   Procedure: OPEN REDUCTION INTERNAL FIXATION (ORIF) DISTAL FEMUR FRACTURE; Surgeon: Rozanna Box, MD; Location: [facility]; Service: Orthopedics; Laterality: Left;  . RETINOPATHY SURGERY Bilateral 1980's?  . TRANSTHORACIC ECHOCARDIOGRAM  02-18-2011   mild LVH, ef 13-24%, grade I diastolic dysfunction/ mild TR/ RV systolic pressure increased consistant with moderate pulmonary hypertension         Family History  Problem Relation Age of Onset  . Colon cancer Father   . Prostate cancer Father   . Diabetes Father   . Coronary artery disease Mother 1  .  Heart disease Mother   . Diabetes Mother   . Coronary artery disease Sister 70  . Diabetes Sister   . Heart disease Sister   . Diabetes Sister   . Diabetes Sister   . Diabetes Sister   . Diabetes Sister   . Diabetes Sister   . Diabetes Maternal Grandmother    Social History: reports that she has never smoked. She has never used smokeless tobacco. She reports that she does  not drink alcohol or use drugs.  Allergies:       Allergies  Allergen Reactions  . Aspirin Nausea And Vomiting  . Ciprofloxacin Other (See Comments)    Upset Stomach  . Codeine Nausea And Vomiting    Makes me sick   . Micardis [Telmisartan] Other (See Comments)    unknown  . Nexium [Esomeprazole Magnesium] Other (See Comments)    Causes internal bleeding  . Niaspan [Niacin Er] Other (See Comments)    Reaction is unknown  . Onglyza [Saxagliptin] Other (See Comments)    Reaction is unknown  . Other     No otc pain medications  . Rofecoxib Other (See Comments)    unknown  . Simvastatin   . Tequin [Gatifloxacin] Other (See Comments)    Reaction is unknown  . Welchol [Colesevelam Hcl]   . Amoxicillin Rash          Medications Prior to Admission  Medication Sig Dispense Refill  . allopurinol (ZYLOPRIM) 100 MG tablet Take 1 tablet (100 mg total) by mouth daily. 30 tablet 0  . amLODipine (NORVASC) 5 MG tablet Take 1 tablet (5 mg total) by mouth daily. 90 tablet 1  . atorvastatin (LIPITOR) 40 MG tablet Take 1 tablet (40 mg total) by mouth every evening. 90 tablet 1  . Blood Glucose Monitoring Suppl (ACCU-CHEK AVIVA PLUS) w/Device KIT Use as directed 4 times daily 1 kit 0  . Colchicine 0.6 MG CAPS Take by mouth daily.    . febuxostat (ULORIC) 40 MG tablet Take 40 mg by mouth daily.    . glucose blood (ACCU-CHEK AVIVA) test strip Use to test glucose 3 times a day 150 each 3  . glucose blood (ACCU-CHEK AVIVA) test strip Use as instructed 4 x daily. E11.65 150 each 5  . insulin aspart protamine - aspart (NOVOLOG MIX 70/30 FLEXPEN) (70-30) 100 UNIT/ML FlexPen Inject 0.08 mLs (8 Units total) into the skin daily with breakfast. Only if blood glucose is above 90 15 mL 2  . Insulin Pen Needle (UNIFINE PENTIPS) 32G X 4 MM MISC Use as directed bid. E11.65 100 each 5  . Lancets (ACCU-CHEK SOFT TOUCH) lancets Use as instructed 4 x daily, E11.65 150 each 5  .  levothyroxine (SYNTHROID, LEVOTHROID) 75 MCG tablet Take 1 tablet (75 mcg total) by mouth daily before breakfast. 90 tablet 1  . metoprolol succinate (TOPROL-XL) 50 MG 24 hr tablet Take 1 tablet (50 mg total) by mouth every morning. 90 tablet 1  . pantoprazole (PROTONIX) 20 MG tablet Take 1 tablet (20 mg total) by mouth daily. 90 tablet 1  . UNIFINE PENTIPS 32G X 4 MM MISC       LabResultsLast48Hours  No results found for this or any previous visit (from the past 48 hour(s)).   ImagingResults(Last48hours)  No results found.    Review of Systems  Constitutional: Negative for chillsand fever.  HENT: Negative for congestionand hearing loss.  Eyes: Negative for blurred visionand double vision.  Respiratory: Negative for coughand shortness of breath.    Cardiovascular: Negative for chest painand palpitations.  Gastrointestinal: Negative for abdominal pain, nauseaand vomiting.  Genitourinary: Negative for dysuria, frequencyand urgency.  Musculoskeletal: Negative for myalgias.  Skin: Negative for itchingand rash.  Neurological: Negative for dizziness.    BP (!) 178/55   Pulse 69   Temp 98.7 F (37.1 C) (Oral)   Resp 18   Ht 5' 2" (1.575 m)   Wt 73 kg (161 lb)   SpO2 99%   BMI 29.45 kg/m    Physical Exam Constitutional: She is oriented to person, place, and time. She appears well-developedand well-nourished.  HENT:  Head: Normocephalicand atraumatic.  Eyes: Conjunctivaeand EOMare normal. Pupils are equal, round, and reactive to light.  Neck: Normal range of motion. Neck supple.  Cardiovascular: Normal rateand regular rhythm.  Respiratory: Effort normaland breath sounds normal.  GI: Soft. Bowel sounds are normal. She exhibits no distension.  Musculoskeletal: Normal range of motion.  Neurological: She is alertand oriented to person, place, and time.  Skin: Skin is warmand dry.    Assessment/Plan Pt is here today for surgery.  Her  potasium is 4.4.  She is ready for surgery.  Risks include pain, bleeding, recurrence and a min chance of incontinence.  

## 2016-10-22 NOTE — Discharge Instructions (Addendum)
ANORECTAL SURGERY: POST OP INSTRUCTIONS °1. Take your usually prescribed home medications unless otherwise directed. °2. DIET: During the first few hours after surgery sip on some liquids until you are able to urinate.  It is normal to not urinate for several hours after this surgery.  If you feel uncomfortable, please contact the office for instructions.  After you are able to urinate,you may eat, if you feel like it.  Follow a light bland diet the first 24 hours after arrival home, such as soup, liquids, crackers, etc.  Be sure to include lots of fluids daily (6-8 glasses).  Avoid fast food or heavy meals, as your are more likely to get nauseated.  Eat a low fat diet the next few days after surgery.  Limit caffeine intake to 1-2 servings a day. °3. PAIN CONTROL: °a. Pain is best controlled by a usual combination of several different methods TOGETHER: °i. Muscle relaxation °1.  Soak in a warm bath (or Sitz bath) three times a day and after bowel movements.  Continue to do this until all pain is resolved. °ii. Over the counter pain medication °iii. Prescription pain medication °b. Most patients will experience some swelling and discomfort in the anus/rectal area and incisions.  Heat such as warm towels, sitz baths, warm baths, etc to help relax tight/sore spots and speed recovery.  Some people prefer to use ice, especially in the first couple days after surgery, as it may decrease the pain and swelling, or alternate between ice & heat.  Experiment to what works for you.  Swelling and bruising can take several weeks to resolve.  Pain can take even longer to completely resolve. °c. It is helpful to take an over-the-counter pain medication regularly for the first few weeks.  Choose one of the following that works best for you: °i. Naproxen (Aleve, etc)  Two 220mg tabs twice a day °ii. Ibuprofen (Advil, etc) Three 200mg tabs four times a day (every meal & bedtime) °d. A  prescription for pain medication (such as  percocet, oxycodone, hydrocodone, etc) should be given to you upon discharge.  Take your pain medication as prescribed.  °i. If you are having problems/concerns with the prescription medicine (does not control pain, nausea, vomiting, rash, itching, etc), please call us [phone] to see if we need to switch you to a different pain medicine that will work better for you and/or control your side effect better. °ii. If you need a refill on your pain medication, please contact your pharmacy.  They will contact our office to request authorization. Prescriptions will not be filled after 5 pm or on week-ends. °4. KEEP YOUR BOWELS REGULAR and AVOID CONSTIPATION °a. The goal is one to two soft bowel movements a day.  You should at least have a bowel movement every other day. °b. Avoid getting constipated.  Between the surgery and the pain medications, it is common to experience some constipation. This can be very painful after rectal surgery.  Increasing fluid intake and taking a fiber supplement (such as Metamucil, Citrucel, FiberCon, etc) 1-2 times a day regularly will usually help prevent this problem from occurring.  A stool softener like colace is also recommended.  This can be purchased over the counter at your pharmacy.  You can take it up to 3 times a day.  If you do not have a bowel movement after 24 hrs since your surgery, take one does of milk of magnesia.  If you still haven't had a bowel movement 8-12   hours after that dose, take another dose.  If you don't have a bowel movement 48 hrs after surgery, purchase a Fleets enema from the drug store and administer gently per package instructions.  If you still are having trouble with your bowel movements after that, please call the office for further instructions. °c. If you develop diarrhea or have many loose bowel movements, simplify your diet to bland foods & liquids for a few days.  Stop any stool softeners and decrease your fiber supplement.  Switching to mild  anti-diarrheal medications (Kayopectate, Pepto Bismol) can help.  If this worsens or does not improve, please call us. ° °5. Wound Care °a. Remove your bandages before your first bowel movement or 8 hours after surgery.     °b. Remove any wound packing material at this tim,e as well.  You do not need to repack the wound unless instructed otherwise.  Wear an absorbent pad or soft cotton gauze in your underwear to catch any drainage and help keep the area clean. You should change this every 2-3 hours while awake. °c. Keep the area clean and dry.  Bathe / shower every day, especially after bowel movements.  Keep the area clean by showering / bathing over the incision / wound.   It is okay to soak an open wound to help wash it.  Wet wipes or showers / gentle washing after bowel movements is often less traumatic than regular toilet paper. °d. You may have some styrofoam-like soft packing in the rectum which will come out with the first bowel movement.  °e. You will often notice bleeding with bowel movements.  This should slow down by the end of the first week of surgery °f. Expect some drainage.  This should slow down, too, by the end of the first week of surgery.  Wear an absorbent pad or soft cotton gauze in your underwear until the drainage stops. °g. Do Not sit on a rubber or pillow ring.  This can make you symptoms worse.  You may sit on a soft pillow if needed.  °6. ACTIVITIES as tolerated:   °a. You may resume regular (light) daily activities beginning the next day--such as daily self-care, walking, climbing stairs--gradually increasing activities as tolerated.  If you can walk 30 minutes without difficulty, it is safe to try more intense activity such as jogging, treadmill, bicycling, low-impact aerobics, swimming, etc. °b. Save the most intensive and strenuous activity for last such as sit-ups, heavy lifting, contact sports, etc  Refrain from any heavy lifting or straining until you are off narcotics for pain  control.   °c. You may drive when you are no longer taking prescription pain medication, you can comfortably sit for long periods of time, and you can safely maneuver your car and apply brakes. °d. You may have sexual intercourse when it is comfortable.  °7. FOLLOW UP in our office °a. Please call [facility] at [phone] to set up an appointment to see your surgeon in the office for a follow-up appointment approximately 3-4 weeks after your surgery. °b. Make sure that you call for this appointment the day you arrive home to insure a convenient appointment time. °10. IF YOU HAVE DISABILITY OR FAMILY LEAVE FORMS, BRING THEM TO THE OFFICE FOR PROCESSING.  DO NOT GIVE THEM TO YOUR DOCTOR. ° ° ° ° °WHEN TO CALL US [phone]: °1. Poor pain control °2. Reactions / problems with new medications (rash/itching, nausea, etc)  °3. Fever over 101.5 F (38.5   C) 4. Inability to urinate 5. Nausea and/or vomiting 6. Worsening swelling or bruising 7. Continued bleeding from incision. 8. Increased pain, redness, or drainage from the incision  The clinic staff is available to answer your questions during regular business hours (8:30am-5pm).  Please dont hesitate to call and ask to speak to one of our nurses for clinical concerns.   A surgeon from [facility] is always on call at the hospitals   If you have a medical emergency, go to the nearest emergency room or call 911. Information for Discharge Teaching: EXPAREL (bupivacaine liposome injectable suspension)   Your surgeon gave you EXPAREL(bupivacaine) in your surgical incision to help control your pain after surgery.   EXPAREL is a local anesthetic that provides pain relief by numbing the tissue around the surgical site.  EXPAREL is designed to release pain medication over time and can control pain for up to 72 hours.  Depending on how you respond to EXPAREL, you may require less pain medication during your recovery.  Possible side  effects:  Temporary loss of sensation or ability to move in the area where bupivacaine was injected.  Nausea, vomiting, constipation  Rarely, numbness and tingling in your mouth or lips, lightheadedness, or anxiety may occur.  Call your doctor right away if you think you may be experiencing any of these sensations, or if you have other questions regarding possible side effects.  Follow all other discharge instructions given to you by your surgeon or nurse. Eat a healthy diet and drink plenty of water or other fluids.  If you return to the hospital for any reason within 96 hours following the administration of EXPAREL, please inform your health care providers.   [facility], [address], [place], [place], [place]  [address] ? MAIN: [phone] ? TOLL FREE: [phone] ? FAX [phone] [email or web address]   Post Anesthesia Home Care Instructions  Activity: Get plenty of rest for the remainder of the day. A responsible individual must stay with you for 24 hours following the procedure.  For the next 24 hours, DO NOT: -Drive a car -Paediatric nurse -Drink alcoholic beverages -Take any medication unless instructed by your physician -Make any legal decisions or sign important papers.  Meals: Start with liquid foods such as gelatin or soup. Progress to regular foods as tolerated. Avoid greasy, spicy, heavy foods. If nausea and/or vomiting occur, drink only clear liquids until the nausea and/or vomiting subsides. Call your physician if vomiting continues.  Special Instructions/Symptoms: Your throat may feel dry or sore from the anesthesia or the breathing tube placed in your throat during surgery. If this causes discomfort, gargle with warm salt water. The discomfort should disappear within 24 hours.  If you had a scopolamine patch placed behind your ear for the management of post- operative nausea and/or vomiting:  1. The medication in  the patch is effective for 72 hours, after which it should be removed.  Wrap patch in a tissue and discard in the trash. Wash hands thoroughly with soap and water. 2. You may remove the patch earlier than 72 hours if you experience unpleasant side effects which may include dry mouth, dizziness or visual disturbances. 3. Avoid touching the patch. Wash your hands with soap and water after contact with the patch.

## 2016-10-22 NOTE — Anesthesia Postprocedure Evaluation (Signed)
Anesthesia Post Note  Patient: [name]  Procedure(s) Performed: Procedure(s) (LRB): LIGATION OF INTERNAL FISTULA TRACT (N/A)     Patient location during evaluation: PACU Anesthesia Type: General Level of consciousness: awake and alert Pain management: pain level controlled Vital Signs Assessment: post-procedure vital signs reviewed and stable Respiratory status: spontaneous breathing, nonlabored ventilation, respiratory function stable and patient connected to nasal cannula oxygen Cardiovascular status: blood pressure returned to baseline and stable Postop Assessment: no signs of nausea or vomiting Anesthetic complications: no    Last Vitals:  Vitals:   10/22/16 0946 10/22/16 1105  BP:  (!) 148/59  Pulse: (!) 57 (!) 58  Resp: 18 18  Temp:  36.7 C    Last Pain:  Vitals:   10/22/16 1105  TempSrc: Oral  PainSc:                  [name]

## 2016-10-22 NOTE — Op Note (Signed)
10/22/2016  8:31 AM  PATIENT:  [name]  71 y.o. female  Patient Care Team: Eustaquio Maize, MD as PCP - General (Pediatrics) [name] Clent Demark, MD as Consulting Physician (Ophthalmology) Fleet Contras, MD as Consulting Physician (Nephrology) Minus Breeding, MD as Consulting Physician (Cardiology) Lafayette Dragon, MD (Inactive) as Consulting Physician (Gastroenterology)  PRE-OPERATIVE DIAGNOSIS:  anal fistula  POST-OPERATIVE DIAGNOSIS:  anal fistula  PROCEDURE:   LIGATION OF INTERNAL FISTULA TRACT  SURGEON:  Surgeon(s): Leighton Ruff, MD Jackolyn Confer, MD  ASSISTANT: Dr Zella Richer   ANESTHESIA:   local and general  SPECIMEN:  No Specimen  DISPOSITION OF SPECIMEN:  N/A  COUNTS:  YES  PLAN OF CARE: Discharge to home after PACU  PATIENT DISPOSITION:  PACU - hemodynamically stable.  INDICATION: 71 y.o. F with a chronic perineal wound.  A fistula was found and seton placed.  The wound healed around the seton.  She is now here for definitive management of the fistula.   OR FINDINGS: posterior anal fistula  DESCRIPTION: the patient was identified in the preoperative holding area and taken to the OR where they were laid on the operating room table.  General anesthesia was induced without difficulty. The patient was then positioned in prone jackknife position with buttocks gently taped apart.  The patient was then prepped and draped in usual sterile fashion.  SCDs were noted to be in place prior to the initiation of anesthesia. A surgical timeout was performed indicating the correct patient, procedure, positioning and need for preoperative antibiotics.  A rectal block was performed using Marcaine with epinephrine.    I began with a digital rectal exam.  There were no masses noted in the anal canal. A seton was in place.  I then placed a Hill-Ferguson anoscope into the anal canal and evaluated this completely.  It was no further pathology noted. The patient has a slight  anal stricture.  I began by making an incision in the posterior midline over the intersphincteric groove. This was carried down into the intersphincteric space using blunt dissection. A [place] retractor was used to open the wound. A fistula probe was placed through the fistula tract and the seton was removed. Fistula tract was dissected away from the surrounding structures using a right angle clamp. I then placed a 2-0 silk suture on the proximal distal portions of the fistula and tied this after removing the fistula probe. Once this was complete I tested the external opening with hydrogen peroxide and showed no signs of leak into the wound. I reinforced the internal opening with a 2-0 Vicryl suture. The intersphincteric space was then closed using interrupted 2-0 Vicryl sutures. The anoderm was closed using a 3-0 chromic suture in interrupted fashion. I then enlarged the external opening to allow for adequate drainage. Exparel mixed with Marcaine was then placed in the wound for a rectal block for postoperative pain control.

## 2016-10-22 NOTE — Anesthesia Procedure Notes (Signed)
Procedure Name: Intubation Date/Time: 10/22/2016 7:35 AM Performed by: Bethena Roys T Pre-anesthesia Checklist: Patient identified, Emergency Drugs available, Suction available and Patient being monitored Patient Re-evaluated:Patient Re-evaluated prior to inductionOxygen Delivery Method: Circle system utilized Preoxygenation: Pre-oxygenation with 100% oxygen Intubation Type: IV induction Ventilation: Mask ventilation without difficulty Laryngoscope Size: Mac and 3 Grade View: Grade I Tube type: Oral Tube size: 7.0 mm Number of attempts: 1 Airway Equipment and Method: Stylet and Oral airway Placement Confirmation: ETT inserted through vocal cords under direct vision,  positive ETCO2 and breath sounds checked- equal and bilateral Secured at: 20 cm Tube secured with: Tape Dental Injury: Teeth and Oropharynx as per pre-operative assessment

## 2016-10-22 NOTE — Transfer of Care (Signed)
Immediate Anesthesia Transfer of Care Note  Patient: [name]  Procedure(s) Performed: Procedure(s): LIGATION OF INTERNAL FISTULA TRACT (N/A)  Patient Location: PACU  Anesthesia Type:General  Level of Consciousness: awake and oriented  Airway & Oxygen Therapy: Patient Spontanous Breathing and Patient connected to nasal cannula oxygen  Post-op Assessment: Report given to RN  Post vital signs: Reviewed and stable  Last Vitals: 173/68, 70, 13, 100%, 98.3 Vitals:   10/22/16 0559  BP: (!) 178/55  Pulse: 69  Resp: 18  Temp: 37.1 C    Last Pain:  Vitals:   10/22/16 0559  TempSrc: Oral      Patients Stated Pain Goal: 10 (37/54/23 7023)  Complications: No apparent anesthesia complications

## 2016-10-22 NOTE — Anesthesia Preprocedure Evaluation (Addendum)
Anesthesia Evaluation  Patient identified by MRN, date of birth, ID band Patient awake    Reviewed: Allergy & Precautions, NPO status , Patient's Chart, lab work & pertinent test results  History of Anesthesia Complications (+) history of anesthetic complications (Post operative confusion)  Airway Mallampati: II  TM Distance: >3 FB Neck ROM: Full    Dental no notable dental hx. (+) Poor Dentition   Pulmonary neg pulmonary ROS,    Pulmonary exam normal breath sounds clear to auscultation       Cardiovascular hypertension, Pt. on medications and Pt. on home beta blockers (-) CAD Normal cardiovascular exam Rhythm:Regular Rate:Normal  RBBB   Neuro/Psych Anxiety Depression negative neurological ROS     GI/Hepatic Neg liver ROS, GERD  Medicated and Controlled,  Endo/Other  diabetes, Type 2, Insulin Dependent  Renal/GU negative Renal ROS  negative genitourinary   Musculoskeletal negative musculoskeletal ROS (+)   Abdominal   Peds negative pediatric ROS (+)  Hematology negative hematology ROS (+)   Anesthesia Other Findings Previous surgery x2 cancelled d/t hyperkalemia. Attributed to ARB, which was stopped. 4.4 today  Reproductive/Obstetrics negative OB ROS                             Anesthesia Physical Anesthesia Plan  ASA: II  Anesthesia Plan: General   Post-op Pain Management:    Induction: Intravenous  Airway Management Planned: Oral ETT  Additional Equipment:   Intra-op Plan:   Post-operative Plan: Extubation in OR  Informed Consent: I have reviewed the patients History and Physical, chart, labs and discussed the procedure including the risks, benefits and alternatives for the proposed anesthesia with the patient or authorized representative who has indicated his/her understanding and acceptance.   Dental advisory given  Plan Discussed with: CRNA  Anesthesia Plan  Comments: (Avoid Versed)       Anesthesia Quick Evaluation

## 2016-10-25 ENCOUNTER — Encounter (HOSPITAL_BASED_OUTPATIENT_CLINIC_OR_DEPARTMENT_OTHER): Payer: Self-pay | Admitting: General Surgery

## 2016-11-09 DIAGNOSIS — L84 Corns and callosities: Secondary | ICD-10-CM | POA: Diagnosis not present

## 2016-11-09 DIAGNOSIS — M79676 Pain in unspecified toe(s): Secondary | ICD-10-CM | POA: Diagnosis not present

## 2016-11-09 DIAGNOSIS — E1151 Type 2 diabetes mellitus with diabetic peripheral angiopathy without gangrene: Secondary | ICD-10-CM | POA: Diagnosis not present

## 2016-11-09 DIAGNOSIS — B351 Tinea unguium: Secondary | ICD-10-CM | POA: Diagnosis not present

## 2016-12-02 ENCOUNTER — Encounter: Payer: Self-pay | Admitting: Pediatrics

## 2016-12-02 ENCOUNTER — Ambulatory Visit (INDEPENDENT_AMBULATORY_CARE_PROVIDER_SITE_OTHER): Payer: Medicare Other | Admitting: Pediatrics

## 2016-12-02 VITALS — BP 131/70 | HR 59 | Temp 96.6°F | Ht 62.0 in | Wt 158.8 lb

## 2016-12-02 DIAGNOSIS — K219 Gastro-esophageal reflux disease without esophagitis: Secondary | ICD-10-CM | POA: Diagnosis not present

## 2016-12-02 DIAGNOSIS — E034 Atrophy of thyroid (acquired): Secondary | ICD-10-CM | POA: Diagnosis not present

## 2016-12-02 DIAGNOSIS — N184 Chronic kidney disease, stage 4 (severe): Secondary | ICD-10-CM

## 2016-12-02 DIAGNOSIS — E1122 Type 2 diabetes mellitus with diabetic chronic kidney disease: Secondary | ICD-10-CM | POA: Diagnosis not present

## 2016-12-02 DIAGNOSIS — I10 Essential (primary) hypertension: Secondary | ICD-10-CM

## 2016-12-02 DIAGNOSIS — Z794 Long term (current) use of insulin: Secondary | ICD-10-CM | POA: Diagnosis not present

## 2016-12-02 LAB — HEMOGLOBIN A1C: HEMOGLOBIN A1C: 7.7

## 2016-12-02 LAB — BAYER DCA HB A1C WAIVED: HB A1C: 7.7 % — AB (ref <7.0)

## 2016-12-02 MED ORDER — CARVEDILOL 6.25 MG PO TABS: 6.2500 mg | ORAL_TABLET | Freq: Two times a day (BID) | ORAL | 1 refills | Status: DC

## 2016-12-02 MED ORDER — PANTOPRAZOLE SODIUM 20 MG PO TBEC: 20.0000 mg | DELAYED_RELEASE_TABLET | Freq: Every day | ORAL | 1 refills | Status: DC

## 2016-12-02 MED ORDER — LEVOTHYROXINE SODIUM 75 MCG PO TABS: ORAL_TABLET | ORAL | 1 refills | Status: DC

## 2016-12-02 MED ORDER — AMLODIPINE BESYLATE 5 MG PO TABS: 5.0000 mg | ORAL_TABLET | Freq: Every day | ORAL | 1 refills | Status: DC

## 2016-12-02 NOTE — Progress Notes (Signed)
  Subjective:   Patient ID: [name], female    DOB: May 17, 1946, 71 y.o.   MRN: [id number] CC: Follow-up med problems HPI: [name] is a 71 y.o. female presenting for Follow-up  DM2: follows with Dr Clent Jacks since her surgery to remove seton has been higher AM BGLs 120s-150  CKD: stable, follows with nephrology  HTN: started on new med coreg by nephrology No CP, no HA  Hypothyroid: stable, taking med daily  Feeling well overall Here today with her husband  Relevant past medical, surgical, family and social history reviewed. Allergies and medications reviewed and updated. History  Smoking Status  . Never Smoker  Smokeless Tobacco  . Never Used   ROS: Per HPI   Objective:    BP 131/70   Pulse (!) 59   Temp (!) 96.6 F (35.9 C) (Oral)   Ht '5\' 2"'$  (1.575 m)   Wt 158 lb 12.8 oz (72 kg)   BMI 29.04 kg/m   Wt Readings from Last 3 Encounters:  12/02/16 158 lb 12.8 oz (72 kg)  10/22/16 161 lb (73 kg)  09/20/16 159 lb (72.1 kg)    Gen: NAD, alert, cooperative with exam, NCAT EYES: EOMI, no conjunctival injection, or no icterus ENT:   OP without erythema LYMPH: no cervical LAD CV: NRRR, normal S1/S2, no murmur Resp: CTABL, no wheezes, normal WOB Abd: +BS, soft, NTND. no guarding or organomegaly Ext: No edema, warm Neuro: Alert and oriented  Assessment & Plan:  [name] was seen today for follow-up med problems.  Diagnoses and all orders for this visit:  Type 2 diabetes mellitus with stage 4 chronic kidney disease, with long-term current use of insulin (HCC) -     CMP14+EGFR -     Bayer DCA Hb A1c Waived -     Microalbumin / creatinine urine ratio  Essential hypertension Cont carvedilol, stop metoprolol -     CMP14+EGFR -     Bayer DCA Hb A1c Waived -     Microalbumin / creatinine urine ratio -     carvedilol (COREG) 6.25 MG tablet; Take 1 tablet (6.25 mg total) by mouth 2 (two) times daily with a meal. -     amLODipine (NORVASC) 5 MG tablet;  Take 1 tablet (5 mg total) by mouth daily.  Hypothyroidism due to acquired atrophy of thyroid Stable, follows with Dr Dorris Fetch -     levothyroxine (SYNTHROID, LEVOTHROID) 46 MCG tablet; TAKE 1 TABLET EVERY DAY BEFORE BREAKFAST  Gastroesophageal reflux disease, esophagitis presence not specified Stable, cont PPI -     pantoprazole (PROTONIX) 20 MG tablet; Take 1 tablet (20 mg total) by mouth daily.  Follow up plan: Return in about 3 months (around 03/04/2017). Assunta Found, MD [place]

## 2016-12-03 LAB — CMP14+EGFR
ALBUMIN: 4.1 g/dL (ref 3.5–4.8)
ALT: 5 IU/L (ref 0–32)
AST: 13 IU/L (ref 0–40)
Albumin/Globulin Ratio: 1.8 (ref 1.2–2.2)
Alkaline Phosphatase: 119 IU/L — ABNORMAL HIGH (ref 39–117)
BILIRUBIN TOTAL: 0.3 mg/dL (ref 0.0–1.2)
BUN / CREAT RATIO: 19 (ref 12–28)
BUN: 34 mg/dL — AB (ref 8–27)
CO2: 20 mmol/L (ref 20–29)
CREATININE: 1.77 mg/dL — AB (ref 0.57–1.00)
Calcium: 9 mg/dL (ref 8.7–10.3)
Chloride: 107 mmol/L — ABNORMAL HIGH (ref 96–106)
GFR calc non Af Amer: 28 mL/min/{1.73_m2} — ABNORMAL LOW (ref >59)
GFR, EST AFRICAN AMERICAN: 33 mL/min/{1.73_m2} — AB (ref >59)
GLOBULIN, TOTAL: 2.3 g/dL (ref 1.5–4.5)
GLUCOSE: 235 mg/dL — AB (ref 65–99)
Potassium: 5.2 mmol/L (ref 3.5–5.2)
Sodium: 144 mmol/L (ref 134–144)
TOTAL PROTEIN: 6.4 g/dL (ref 6.0–8.5)

## 2016-12-10 ENCOUNTER — Telehealth: Payer: Self-pay | Admitting: Pediatrics

## 2016-12-13 ENCOUNTER — Telehealth: Payer: Self-pay

## 2016-12-13 NOTE — Telephone Encounter (Signed)
Mrs. Heater is calling to check on her lab work.  When you have a moment, will you please review and advise.

## 2016-12-13 NOTE — Telephone Encounter (Signed)
Result note now in

## 2016-12-13 NOTE — Telephone Encounter (Signed)
Patient called earlier this morning wanting results of her lab work and also lab work for her husband [name].   Patient has called again about labwork.  Please advise.

## 2016-12-20 ENCOUNTER — Ambulatory Visit (INDEPENDENT_AMBULATORY_CARE_PROVIDER_SITE_OTHER): Payer: Medicare Other | Admitting: "Endocrinology

## 2016-12-20 ENCOUNTER — Encounter: Payer: Self-pay | Admitting: "Endocrinology

## 2016-12-20 VITALS — BP 157/74 | HR 67 | Ht 62.0 in | Wt 155.8 lb

## 2016-12-20 DIAGNOSIS — Z794 Long term (current) use of insulin: Secondary | ICD-10-CM | POA: Diagnosis not present

## 2016-12-20 DIAGNOSIS — E782 Mixed hyperlipidemia: Secondary | ICD-10-CM | POA: Diagnosis not present

## 2016-12-20 DIAGNOSIS — E1122 Type 2 diabetes mellitus with diabetic chronic kidney disease: Secondary | ICD-10-CM | POA: Diagnosis not present

## 2016-12-20 DIAGNOSIS — E039 Hypothyroidism, unspecified: Secondary | ICD-10-CM | POA: Diagnosis not present

## 2016-12-20 DIAGNOSIS — I10 Essential (primary) hypertension: Secondary | ICD-10-CM | POA: Diagnosis not present

## 2016-12-20 DIAGNOSIS — N184 Chronic kidney disease, stage 4 (severe): Secondary | ICD-10-CM | POA: Diagnosis not present

## 2016-12-20 NOTE — Progress Notes (Signed)
Subjective:    Patient ID: [name], female    DOB: [birth year]. Patient is to follow-up for uncontrolled type 2 diabetes with her meter and logs.  Past Medical History:  Diagnosis Date  . Anal fistula   . Anemia in chronic renal disease    Aranesp injection --  when Hg <11, last injection 12-24-14.  Marland Kitchen Anxiety   . Arthritis    knees and hand/fingers. "broke back"-being evaluated for this"weakness left leg"  . 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 CKD (chronic kidney disease), stage III    nephrologist--  dr Mercy Moore-- [facility]  07-09-2016  . Complication of anesthesia    post-op confusion   . Constipation   . Coronary artery disease   . Diabetic gastroparesis ([place])   . Diabetic retinopathy ([place])    bilateral --  monitored by dr [name]  . Diverticulosis of colon   . GAD (generalized anxiety disorder)   . GERD (gastroesophageal reflux disease)   . History of colon polyps    benign  . History of esophagitis   . History of GI bleed    upper 2009  due to esophagitis  &  2002  due to Mallory-Weiss tear  . History of hyperkalemia    pt had previously been canceled twice dos due to elevated K+, 07-29-2016 last date cancelled -- pt visited pcp same day treated w/ kayexelate and K+ came down, pt brought all her medication's in to pcp office and found pt was taking losartan that had been discontinued due to ckd, pcp stated this was cause of elevated K+  . 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 History of rectal abscess    12-29-2004  bedside I & D  . Hyperlipidemia   . Hypertension   . Hypothyroidism   . LAFB (left anterior fascicular block)   . Right bundle branch block   . Sacral decubitus ulcer    since 2014- 01-02-15 remains with wound" gauze dressing changes daily.  . Type II diabetes mellitus ([place])    Past Surgical History:  Procedure Laterality Date  . APPLICATION OF A-CELL OF EXTREMITY N/A 04/07/2015   Procedure: A  CELL PLACMENT;  Surgeon: Loel Lofty Dillingham, DO;  Location: [facility];  Service: Plastics;  Laterality: N/A;  . CARDIOVASCULAR STRESS TEST  12-30-2004   normal perfusion study/  no ischemia or infartion/  normal LV wall function and wall motion , ef 66%  . CATARACT EXTRACTION W/ INTRAOCULAR LENS  IMPLANT, BILATERAL  1995  . COLONOSCOPY W/ POLYPECTOMY  last one 2008  . COMPRESSION HIP SCREW Right 05/18/2014   Procedure: COMPRESSION HIP;  Surgeon: Carole Civil, MD;  Location: [facility];  Service: Orthopedics;  Laterality: Right;  . ESOPHAGOGASTRODUODENOSCOPY  last one 01-09-2011  . EVALUATION UNDER ANESTHESIA WITH FISTULECTOMY N/A 01/06/2015   Procedure: EXAM UNDER ANESTHESIA , placement of seton;  Surgeon: Jackolyn Confer, MD;  Location: [facility];  Service: General;  Laterality: N/A;  . I&D EXTREMITY N/A 04/07/2015   Procedure: IRRIGATION AND DEBRIDEMENT ISCHIAL ULCER;  Surgeon: Loel Lofty Dillingham, DO;  Location: [facility];  Service: Plastics;  Laterality: N/A;  . INCISION AND DRAINAGE OF WOUND N/A 09/30/2014   Procedure: IRRIGATION AND DEBRIDEMENT SACRAL WOUND, EXCISION OF PERIRECTAL TRACT WITH PLACEMENT OF ACCELL;  Surgeon: Theodoro Kos, DO;  Location: [facility];  Service: Plastics;  Laterality: N/A;  . LIGATION OF INTERNAL FISTULA  TRACT N/A 10/22/2016   Procedure: LIGATION OF INTERNAL FISTULA TRACT;  Surgeon: Leighton Ruff, MD;  [facility];  Service: General;  Laterality: N/A;  . ORIF FEMUR FRACTURE Left 10/09/2012   Procedure: OPEN REDUCTION INTERNAL FIXATION (ORIF) DISTAL FEMUR FRACTURE;  Surgeon: Rozanna Box, MD;  Location: [facility];  Service: Orthopedics;  Laterality: Left;  . RETINOPATHY SURGERY Bilateral 1980's?  . TRANSTHORACIC ECHOCARDIOGRAM  02-18-2011   mild LVH,  ef 55-60%,  grade I diastolic dysfunction/  mild TR/  RV systolic pressure increased consistant with moderate pulmonary hypertension   Social History   Social History  . Marital  status: Married    Spouse name: [name]  . Number of children: 0  . Years of education: 12   Occupational History  . Retired Retired   Social History Main Topics  . Smoking status: Never Smoker  . Smokeless tobacco: Never Used  . Alcohol use No  . Drug use: No  . Sexual activity: Not Currently   Other Topics Concern  . None   Social History Narrative   Lives with husband.    Caffeine use: none   Outpatient Encounter Prescriptions as of 12/20/2016  Medication Sig  . amLODipine (NORVASC) 5 MG tablet Take 1 tablet (5 mg total) by mouth daily.  . carvedilol (COREG) 6.25 MG tablet Take 1 tablet (6.25 mg total) by mouth 2 (two) times daily with a meal.  . Colchicine 0.6 MG CAPS Take by mouth as needed.   . febuxostat (ULORIC) 40 MG tablet Take 40 mg by mouth every morning.   . furosemide (LASIX) 40 MG tablet Take 40 mg by mouth.  Marland Kitchen glucose blood (ACCU-CHEK AVIVA) test strip Use 2 times daily to test glucose E11.65  . insulin aspart protamine - aspart (NOVOLOG 70/30 MIX) (70-30) 100 UNIT/ML FlexPen Inject 10 Units into the skin daily with breakfast.  . levothyroxine (SYNTHROID, LEVOTHROID) 75 MCG tablet TAKE 1 TABLET EVERY DAY BEFORE BREAKFAST  . pantoprazole (PROTONIX) 20 MG tablet Take 1 tablet (20 mg total) by mouth daily.  . traMADol (ULTRAM) 50 MG tablet Take 1-2 tablets (50-100 mg total) by mouth every 6 (six) hours as needed.  Marland Kitchen ULTICARE MICRO PEN NEEDLES 32G X 4 MM MISC   . [DISCONTINUED] insulin aspart protamine - aspart (NOVOLOG MIX 70/30 FLEXPEN) (70-30) 100 UNIT/ML FlexPen Inject 0.08 mLs (8 Units total) into the skin daily with breakfast. Only if blood glucose is above 90  . [DISCONTINUED] metoprolol succinate (TOPROL-XL) 50 MG 24 hr tablet Take 1 tablet (50 mg total) by mouth every morning. Take with or immediately following a meal. (Patient not taking: Reported on 12/20/2016)   No facility-administered encounter medications on file as of 12/20/2016.    ALLERGIES: Allergies    Allergen Reactions  . Aspirin Nausea And Vomiting  . Ciprofloxacin Other (See Comments)    Upset Stomach  . Codeine Nausea And Vomiting    Makes me sick   . Losartan     Hyperkalemia   . Micardis [Telmisartan] Other (See Comments)    unknown  . Nexium [Esomeprazole Magnesium] Other (See Comments)    Causes internal bleeding  . Niaspan [Niacin Er] Other (See Comments)    Reaction is unknown  . Onglyza [Saxagliptin] Other (See Comments)    Reaction is unknown  . Other     No otc pain medications  . Rofecoxib Other (See Comments)    unknown  . Simvastatin   . Tequin [Gatifloxacin] Other (See  Comments)    Reaction is unknown  . Welchol [Colesevelam Hcl]   . Amoxicillin Nausea And Vomiting and Rash    Has patient had a PCN reaction causing immediate rash, facial/tongue/throat swelling, SOB or lightheadedness with hypotension: Yes Has patient had a PCN reaction causing severe rash involving mucus membranes or skin necrosis: no  Has patient had a PCN reaction that required hospitalization No Has patient had a PCN reaction occurring within the last 10 years: No If all of the above answers are "NO", then may proceed with Cephalosporin use.    VACCINATION STATUS: Immunization History  Administered Date(s) Administered  . Influenza, High Dose Seasonal PF 03/05/2016  . Influenza,inj,Quad PF,36+ Mos 02/16/2013, 02/27/2015  . Pneumococcal Conjugate-13 10/11/2014  . Pneumococcal Polysaccharide-23 10/10/2012  . Tdap 10/06/2012    Diabetes  She presents for her follow-up diabetic visit. She has type 2 diabetes mellitus. Onset time: She was diagnosed at approximate age of 27 years . Her disease course has been stable. There are no hypoglycemic associated symptoms. Pertinent negatives for hypoglycemia include no confusion, headaches, pallor or seizures. Associated symptoms include fatigue and visual change. Pertinent negatives for diabetes include no chest pain, no polydipsia and no  polyphagia. There are no hypoglycemic complications. Symptoms are stable. Diabetic complications include nephropathy, peripheral neuropathy, PVD and retinopathy. Risk factors for coronary artery disease include diabetes mellitus, dyslipidemia, hypertension and sedentary lifestyle. Current diabetic treatment includes oral agent (monotherapy). She is compliant with treatment most of the time. Weight trend: Wheelchair-bound. She is following a generally unhealthy diet. When asked about meal planning, she reported none. She has not had a previous visit with a dietitian. She never participates in exercise. Her home blood glucose trend is increasing steadily (Her glucose profile today is acceptable between 100- 180 mg per DL average.). Her breakfast blood glucose range is generally 140-180 mg/dl. Her dinner blood glucose range is generally 140-180 mg/dl. Her overall blood glucose range is 140-180 mg/dl. An ACE inhibitor/angiotensin II receptor blocker is being taken. Eye exam is current.  Thyroid Problem  Presents for initial visit. Onset time: 15 years. Symptoms include fatigue and visual change. Patient reports no cold intolerance, diarrhea, heat intolerance or palpitations. The symptoms have been stable. Past treatments include levothyroxine. The following procedures have not been performed: thyroidectomy.  Hypertension  This is a chronic problem. The current episode started more than 1 year ago. Pertinent negatives include no chest pain, headaches, palpitations or shortness of breath. Past treatments include angiotensin blockers. Hypertensive end-organ damage includes PVD and retinopathy. Identifiable causes of hypertension include a thyroid problem.      Review of Systems  Constitutional: Positive for fatigue. Negative for unexpected weight change.  HENT: Negative for trouble swallowing and voice change.   Eyes: Negative for visual disturbance.  Respiratory: Negative for cough, shortness of breath and  wheezing.   Cardiovascular: Negative for chest pain, palpitations and leg swelling.  Gastrointestinal: Negative for diarrhea, nausea and vomiting.  Endocrine: Negative for cold intolerance, heat intolerance, polydipsia and polyphagia.  Musculoskeletal: Positive for arthralgias, back pain, gait problem, joint swelling and myalgias.          Skin: Negative for color change, pallor, rash and wound.  Neurological: Negative for seizures and headaches.  Psychiatric/Behavioral: Negative for confusion and suicidal ideas.    Objective:    BP (!) 157/74 (BP Location: Left Arm, Patient Position: Sitting, Cuff Size: Large)   Pulse 67   Ht 5\' 2"  (1.575 m)   Wt 155 lb  12.8 oz (70.7 kg)   SpO2 99%   BMI 28.50 kg/m   Wt Readings from Last 3 Encounters:  12/20/16 155 lb 12.8 oz (70.7 kg)  12/02/16 158 lb 12.8 oz (72 kg)  10/22/16 161 lb (73 kg)    Physical Exam  Constitutional: She is oriented to person, place, and time. She appears well-developed.  HENT:  Head: Normocephalic and atraumatic.  Eyes: EOM are normal.  Neck: Normal range of motion. Neck supple. No tracheal deviation present. No thyromegaly present.  Cardiovascular: Normal rate and regular rhythm.  Exam reveals decreased pulses.   Pulses:      Dorsalis pedis pulses are 0 on the right side, and 0 on the left side.       Posterior tibial pulses are 0 on the right side, and 0 on the left side.  Pulmonary/Chest: Effort normal and breath sounds normal.  Abdominal: Soft. Bowel sounds are normal. There is no tenderness. There is no guarding.  Musculoskeletal: She exhibits no edema.       Arms: She is wheelchair-bound due to recent hip fracture and diffuse debilitating arthritis.  Neurological: She is alert and oriented to person, place, and time. She has normal reflexes. A sensory deficit is present. No cranial nerve deficit. Coordination normal.  Skin: Skin is warm and dry. No rash noted. No erythema. No pallor.  Psychiatric: She has  a normal mood and affect. Judgment normal.    Results for orders placed or performed in visit on 12/20/16  Hemoglobin A1c  Result Value Ref Range   Hemoglobin A1C 7.7    Diabetic Labs (most recent): Lab Results  Component Value Date   HGBA1C 7.7 12/02/2016   HGBA1C 6.6 (H) 05/05/2016   HGBA1C 7.3 (H) 11/21/2015   Lipid Panel     Component Value Date/Time   CHOL 159 03/05/2016 0904   CHOL 105 09/11/2012 1057   TRIG 157 (H) 03/05/2016 0904   TRIG 170 (H) 10/11/2014 1159   TRIG 79 09/11/2012 1057   HDL 33 (L) 03/05/2016 0904   HDL 53 10/11/2014 1159   HDL 41 09/11/2012 1057   CHOLHDL 4.8 (H) 03/05/2016 0904   LDLCALC 95 03/05/2016 0904   LDLCALC 50 02/14/2014 1012   LDLCALC 48 09/11/2012 1057     Assessment & Plan:   1. Type 2 diabetes mellitus with stage 4 chronic kidney disease, without long-term current use of insulin ([place])   - Patient has currently uncontrolled symptomatic type 2 DM since  71 years of age,  with most recent A1c of 7.7% , generally improving from  10 %. Recent labs reviewed. - Her renal function is improving.   -her  diabetes is complicated by PAD, CKD, neuropathy and patient remains at a high risk for more acute and chronic complications of diabetes which include CAD, CVA, CKD, retinopathy, and neuropathy. These are all discussed in detail with the patient.  - I have counseled the patient on diet management  by adopting a carbohydrate restricted/protein rich diet.  - Suggestion is made for patient to avoid simple carbohydrates   from her diet including Cakes , Desserts, Ice Cream,  Soda (  diet and regular) , Sweet Tea , Candies,  Chips, Cookies, Artificial Sweeteners,   and "Sugar-free" Products . This will help patient to have stable blood glucose profile and potentially avoid unintended weight gain.  - I encouraged the patient to switch to  unprocessed or minimally processed complex starch and increased protein intake (animal or plant  source),  fruits, and vegetables.  - Patient is advised to stick to a routine mealtimes to eat 3 meals  a day and avoid unnecessary snacks ( to snack only to correct hypoglycemia).   - I have approached patient with the following individualized plan to manage diabetes and patient agrees:   - She came with better and safer blood glucose profile .  -Her husband continues to offer to help. I will increase NovoLog 70/30   to 10 units only with breakfast for pre-meal glucose above 90 mg/dL associated with monitoring of blood glucose  before meals and at bedtime. - After she finishes her current supply of NovoLog 70/30, she will be switched to basal insulin covered by her insurance, her choices include Lantus, Levemir, [name], and Toujeo.  - First priority in this patient would be to avoid hypoglycemia.  If she cannot perform this therapy even with the help of her husband, her best option is placement in skilled care facility.  -Patient is encouraged to call clinic for blood glucose levels less than 70 or above 300 mg /dl.  -Patient is not a candidate for MTF, Incretin therapy.   - Patient specific target  A1c;  LDL, HDL, Triglycerides, and  Waist Circumference were discussed in detail.  2) BP/HTN: Controlled. Continue current medications including ACEI/ARB. 3) Lipids/HPL:  continue statins. 4)  Weight/Diet: CDE Consult has been initiated, she has limited ability to exercise.  5)  Hypothyroidism:  - Her recent thyroid function tests are consistent with appropriate replacement and I will continue levothyroxine  75 mcg po qam.  - We discussed about correct intake of levothyroxine, at fasting, with water, separated by at least 30 minutes from breakfast, and separated by more than 4 hours from calcium, iron, multivitamins, acid reflux medications (PPIs). -Patient is made aware of the fact that thyroid hormone replacement is needed for life, dose to be adjusted by periodic monitoring of thyroid function  tests.  5) Chronic Care/Health Maintenance:  -Patient is  on ACEI/ARB and Statin medications and encouraged to continue to follow up with Ophthalmology, Podiatrist at least yearly or according to recommendations, and advised to   stay away from smoking. I have recommended yearly flu vaccine and pneumonia vaccination at least every 5 years; moderate intensity exercise for up to 150 minutes weekly; and  sleep for at least 7 hours a day.  - 20 minutes of time was spent on the care of this patient , 50% of which was applied for counseling on diabetes complications and their preventions.  - Patient to bring meter and  blood glucose logs during her next visit in 2 weeks.   - I advised patient to maintain close follow up with Eustaquio Maize, MD for primary care needs.  Follow up plan: - Return in about 3 months (around 03/22/2017) for follow up with pre-visit labs, meter, and logs.  Glade Lloyd, MD Phone: [phone]  Fax: [phone]   12/20/2016, 3:33 PM

## 2017-01-20 DIAGNOSIS — I129 Hypertensive chronic kidney disease with stage 1 through stage 4 chronic kidney disease, or unspecified chronic kidney disease: Secondary | ICD-10-CM | POA: Diagnosis not present

## 2017-01-20 DIAGNOSIS — N183 Chronic kidney disease, stage 3 (moderate): Secondary | ICD-10-CM | POA: Diagnosis not present

## 2017-01-20 DIAGNOSIS — N184 Chronic kidney disease, stage 4 (severe): Secondary | ICD-10-CM | POA: Diagnosis not present

## 2017-01-20 DIAGNOSIS — E1122 Type 2 diabetes mellitus with diabetic chronic kidney disease: Secondary | ICD-10-CM | POA: Diagnosis not present

## 2017-01-20 DIAGNOSIS — N2581 Secondary hyperparathyroidism of renal origin: Secondary | ICD-10-CM | POA: Diagnosis not present

## 2017-01-20 DIAGNOSIS — R809 Proteinuria, unspecified: Secondary | ICD-10-CM | POA: Diagnosis not present

## 2017-01-20 DIAGNOSIS — D631 Anemia in chronic kidney disease: Secondary | ICD-10-CM | POA: Diagnosis not present

## 2017-02-15 DIAGNOSIS — M2550 Pain in unspecified joint: Secondary | ICD-10-CM | POA: Diagnosis not present

## 2017-02-15 DIAGNOSIS — Z6828 Body mass index (BMI) 28.0-28.9, adult: Secondary | ICD-10-CM | POA: Diagnosis not present

## 2017-02-15 DIAGNOSIS — N183 Chronic kidney disease, stage 3 (moderate): Secondary | ICD-10-CM | POA: Diagnosis not present

## 2017-02-15 DIAGNOSIS — M1A09X1 Idiopathic chronic gout, multiple sites, with tophus (tophi): Secondary | ICD-10-CM | POA: Diagnosis not present

## 2017-02-15 DIAGNOSIS — E663 Overweight: Secondary | ICD-10-CM | POA: Diagnosis not present

## 2017-02-15 DIAGNOSIS — Z79899 Other long term (current) drug therapy: Secondary | ICD-10-CM | POA: Diagnosis not present

## 2017-03-01 DIAGNOSIS — M79676 Pain in unspecified toe(s): Secondary | ICD-10-CM | POA: Diagnosis not present

## 2017-03-01 DIAGNOSIS — B351 Tinea unguium: Secondary | ICD-10-CM | POA: Diagnosis not present

## 2017-03-01 DIAGNOSIS — E1151 Type 2 diabetes mellitus with diabetic peripheral angiopathy without gangrene: Secondary | ICD-10-CM | POA: Diagnosis not present

## 2017-03-01 DIAGNOSIS — L84 Corns and callosities: Secondary | ICD-10-CM | POA: Diagnosis not present

## 2017-03-14 ENCOUNTER — Ambulatory Visit (INDEPENDENT_AMBULATORY_CARE_PROVIDER_SITE_OTHER): Payer: Medicare Other | Admitting: Pediatrics

## 2017-03-14 ENCOUNTER — Encounter: Payer: Self-pay | Admitting: Pediatrics

## 2017-03-14 VITALS — BP 180/90 | HR 67 | Temp 97.6°F | Ht 62.0 in | Wt 153.0 lb

## 2017-03-14 DIAGNOSIS — M1A3791 Chronic gout due to renal impairment, unspecified ankle and foot, with tophus (tophi): Secondary | ICD-10-CM | POA: Diagnosis not present

## 2017-03-14 DIAGNOSIS — N183 Chronic kidney disease, stage 3 unspecified: Secondary | ICD-10-CM

## 2017-03-14 DIAGNOSIS — Z23 Encounter for immunization: Secondary | ICD-10-CM

## 2017-03-14 DIAGNOSIS — E119 Type 2 diabetes mellitus without complications: Secondary | ICD-10-CM | POA: Diagnosis not present

## 2017-03-14 DIAGNOSIS — M810 Age-related osteoporosis without current pathological fracture: Secondary | ICD-10-CM | POA: Diagnosis not present

## 2017-03-14 DIAGNOSIS — E039 Hypothyroidism, unspecified: Secondary | ICD-10-CM | POA: Diagnosis not present

## 2017-03-14 DIAGNOSIS — E113499 Type 2 diabetes mellitus with severe nonproliferative diabetic retinopathy without macular edema, unspecified eye: Secondary | ICD-10-CM

## 2017-03-14 DIAGNOSIS — I10 Essential (primary) hypertension: Secondary | ICD-10-CM

## 2017-03-14 DIAGNOSIS — E782 Mixed hyperlipidemia: Secondary | ICD-10-CM

## 2017-03-14 LAB — BAYER DCA HB A1C WAIVED: HB A1C: 7 % — AB (ref <7.0)

## 2017-03-14 MED ORDER — ATORVASTATIN CALCIUM 10 MG PO TABS: 10.0000 mg | ORAL_TABLET | Freq: Every day | ORAL | 1 refills | Status: DC

## 2017-03-14 NOTE — Progress Notes (Signed)
  Subjective:   Patient ID: [name], female    DOB: May 13, 1946, 71 y.o.   MRN: [id number] CC: Follow-up (3 month) multiple med prob HPI: [name] is a 71 y.o. female presenting for Follow-up (3 month)  HTN: no CP, SOB At home she checks regularly, highest she can remember was 141 SBP, rare Usually 120s/70s  DM2: has appt upcoming with endocrine Says BGLs low 100s  Fasting this morning Wants blood work drawn today for upcoming endocrine appt Has not taken medicines yet this morning  Gout: following with rheumatology,   Retinopathy: following with eye doctor, has appt next month Has had to have laser surgery  Due for mammo in 04/2016, gets done at imaging center in [place] last year  Had rectal abscess, wasn't able to get colonoscopy when due Wants to call to schedule next colonoscopy next week  Relevant past medical, surgical, family and social history reviewed. Allergies and medications reviewed and updated. History  Smoking Status  . Never Smoker  Smokeless Tobacco  . Never Used   ROS: Per HPI   Objective:    BP (!) 180/90   Pulse 67   Temp 97.6 F (36.4 C) (Oral)   Ht _0  (1.575 m)   Wt 153 lb (69.4 kg)   BMI 27.98 kg/m   Wt Readings from Last 3 Encounters:  03/14/17 153 lb (69.4 kg)  12/20/16 155 lb 12.8 oz (70.7 kg)  12/02/16 158 lb 12.8 oz (72 kg)    Gen: NAD, alert, cooperative with exam, NCAT EYES: EOMI, no conjunctival injection, or no icterus ENT:  OP without erythema LYMPH: no cervical LAD CV: NRRR, normal S1/S2, no murmur, distal pulses 2+ b/l Resp: CTABL, no wheezes, normal WOB Abd: +BS, soft, NTND.  Ext: No edema, warm Neuro: Alert and oriented  Assessment & Plan:  [name] was seen today for follow-up multiple med problems.  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 [name] Found, MD [place]

## 2017-03-15 LAB — CMP14+EGFR
A/G RATIO: 1.6 (ref 1.2–2.2)
ALK PHOS: 111 IU/L (ref 39–117)
ALT: 6 IU/L (ref 0–32)
AST: 13 IU/L (ref 0–40)
Albumin: 4 g/dL (ref 3.5–4.8)
BILIRUBIN TOTAL: 0.4 mg/dL (ref 0.0–1.2)
BUN/Creatinine Ratio: 29 — ABNORMAL HIGH (ref 12–28)
BUN: 42 mg/dL — ABNORMAL HIGH (ref 8–27)
CALCIUM: 9.2 mg/dL (ref 8.7–10.3)
CHLORIDE: 102 mmol/L (ref 96–106)
CO2: 20 mmol/L (ref 20–29)
Creatinine, Ser: 1.44 mg/dL — ABNORMAL HIGH (ref 0.57–1.00)
GFR calc Af Amer: 42 mL/min/{1.73_m2} — ABNORMAL LOW (ref >59)
GFR, EST NON AFRICAN AMERICAN: 37 mL/min/{1.73_m2} — AB (ref >59)
Globulin, Total: 2.5 g/dL (ref 1.5–4.5)
Glucose: 196 mg/dL — ABNORMAL HIGH (ref 65–99)
POTASSIUM: 4.6 mmol/L (ref 3.5–5.2)
SODIUM: 137 mmol/L (ref 134–144)
Total Protein: 6.5 g/dL (ref 6.0–8.5)

## 2017-03-15 LAB — LIPID PANEL
CHOLESTEROL TOTAL: 160 mg/dL (ref 100–199)
Chol/HDL Ratio: 4.3 ratio (ref 0.0–4.4)
HDL: 37 mg/dL — AB (ref >39)
LDL Calculated: 89 mg/dL (ref 0–99)
TRIGLYCERIDES: 170 mg/dL — AB (ref 0–149)
VLDL Cholesterol Cal: 34 mg/dL (ref 5–40)

## 2017-03-15 LAB — THYROID PANEL WITH TSH
FREE THYROXINE INDEX: 2.4 (ref 1.2–4.9)
T3 UPTAKE RATIO: 33 % (ref 24–39)
T4, Total: 7.3 ug/dL (ref 4.5–12.0)
TSH: 0.473 u[IU]/mL (ref 0.450–4.500)

## 2017-03-17 ENCOUNTER — Telehealth: Payer: Self-pay | Admitting: Pediatrics

## 2017-03-17 DIAGNOSIS — I10 Essential (primary) hypertension: Secondary | ICD-10-CM

## 2017-03-17 DIAGNOSIS — K219 Gastro-esophageal reflux disease without esophagitis: Secondary | ICD-10-CM

## 2017-03-17 DIAGNOSIS — E034 Atrophy of thyroid (acquired): Secondary | ICD-10-CM

## 2017-03-17 DIAGNOSIS — E782 Mixed hyperlipidemia: Secondary | ICD-10-CM

## 2017-03-18 MED ORDER — PANTOPRAZOLE SODIUM 20 MG PO TBEC: 20.0000 mg | DELAYED_RELEASE_TABLET | Freq: Every day | ORAL | 1 refills | Status: DC

## 2017-03-18 MED ORDER — LEVOTHYROXINE SODIUM 75 MCG PO TABS: ORAL_TABLET | ORAL | 1 refills | Status: DC

## 2017-03-18 MED ORDER — CARVEDILOL 6.25 MG PO TABS: 6.2500 mg | ORAL_TABLET | Freq: Two times a day (BID) | ORAL | 1 refills | Status: DC

## 2017-03-18 MED ORDER — AMLODIPINE BESYLATE 5 MG PO TABS: 5.0000 mg | ORAL_TABLET | Freq: Every day | ORAL | 1 refills | Status: DC

## 2017-03-18 MED ORDER — ATORVASTATIN CALCIUM 10 MG PO TABS: 10.0000 mg | ORAL_TABLET | Freq: Every day | ORAL | 1 refills | Status: DC

## 2017-03-18 NOTE — Telephone Encounter (Signed)
Rx resent to [facility]

## 2017-03-24 ENCOUNTER — Encounter: Payer: Self-pay | Admitting: "Endocrinology

## 2017-03-24 ENCOUNTER — Ambulatory Visit (INDEPENDENT_AMBULATORY_CARE_PROVIDER_SITE_OTHER): Payer: Medicare Other | Admitting: "Endocrinology

## 2017-03-24 VITALS — BP 130/71 | HR 76 | Ht 62.0 in | Wt 154.0 lb

## 2017-03-24 DIAGNOSIS — E782 Mixed hyperlipidemia: Secondary | ICD-10-CM | POA: Diagnosis not present

## 2017-03-24 DIAGNOSIS — I10 Essential (primary) hypertension: Secondary | ICD-10-CM

## 2017-03-24 DIAGNOSIS — Z794 Long term (current) use of insulin: Secondary | ICD-10-CM

## 2017-03-24 DIAGNOSIS — N184 Chronic kidney disease, stage 4 (severe): Secondary | ICD-10-CM | POA: Diagnosis not present

## 2017-03-24 DIAGNOSIS — E1122 Type 2 diabetes mellitus with diabetic chronic kidney disease: Secondary | ICD-10-CM

## 2017-03-24 DIAGNOSIS — E039 Hypothyroidism, unspecified: Secondary | ICD-10-CM | POA: Diagnosis not present

## 2017-03-24 MED ORDER — INSULIN DEGLUDEC 100 UNIT/ML ~~LOC~~ SOPN: 10.0000 [IU] | PEN_INJECTOR | Freq: Every day | SUBCUTANEOUS | 2 refills | Status: DC

## 2017-03-24 NOTE — Progress Notes (Signed)
Subjective:    Patient ID: [name], female    DOB: 1946-03-04. Patient is to follow-up for uncontrolled type 2 diabetes with her meter and logs.  Past Medical History:  Diagnosis Date  . Anal fistula   . Anemia in chronic renal disease    Aranesp injection --  when Hg <11, last injection 12-24-14.  Marland Kitchen Anxiety   . Arthritis    knees and hand/fingers. "broke back"-being evaluated for this"weakness left leg"  . 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 CKD (chronic kidney disease), stage III [facility])    nephrologist--  dr Mercy Moore-- [place]  07-09-2016  . Complication of anesthesia    post-op confusion   . Constipation   . Coronary artery disease   . Diabetic gastroparesis ([place])   . Diabetic retinopathy ([place])    bilateral --  monitored by dr [name]  . Diverticulosis of colon   . GAD (generalized anxiety disorder)   . GERD (gastroesophageal reflux disease)   . History of colon polyps    benign  . History of esophagitis   . History of GI bleed    upper 2009  due to esophagitis  &  2002  due to Mallory-Weiss tear  . History of hyperkalemia    pt had previously been canceled twice dos due to elevated K+, 07-29-2016 last date cancelled -- pt visited pcp same day treated w/ kayexelate and K+ came down, pt brought all her medication's in to pcp office and found pt was taking losartan that had been discontinued due to ckd, pcp stated this was cause of elevated K+  . 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 History of rectal abscess    12-29-2004  bedside I & D  . Hyperlipidemia   . Hypertension   . Hypothyroidism   . LAFB (left anterior fascicular block)   . Right bundle branch block   . Sacral decubitus ulcer    since 2014- 01-02-15 remains with wound" gauze dressing changes daily.  . Type II diabetes mellitus ([place])    Past Surgical History:  Procedure Laterality Date  . APPLICATION OF A-CELL OF EXTREMITY N/A 04/07/2015   Procedure: A CELL PLACMENT;  Surgeon: Loel Lofty Dillingham, DO;  Location: [place];  Service: Plastics;  Laterality: N/A;  . CARDIOVASCULAR STRESS TEST  12-30-2004   normal perfusion study/  no ischemia or infartion/  normal LV wall function and wall motion , ef 66%  . CATARACT EXTRACTION W/ INTRAOCULAR LENS  IMPLANT, BILATERAL  1995  . COLONOSCOPY W/ POLYPECTOMY  last one 2008  . COMPRESSION HIP SCREW Right 05/18/2014   Procedure: COMPRESSION HIP;  Surgeon: Carole Civil, MD;  Location: [facility];  Service: Orthopedics;  Laterality: Right;  . ESOPHAGOGASTRODUODENOSCOPY  last one 01-09-2011  . EVALUATION UNDER ANESTHESIA WITH FISTULECTOMY N/A 01/06/2015   Procedure: EXAM UNDER ANESTHESIA , placement of seton;  Surgeon: Jackolyn Confer, MD;  Location: [facility];  Service: General;  Laterality: N/A;  . I&D EXTREMITY N/A 04/07/2015   Procedure: IRRIGATION AND DEBRIDEMENT ISCHIAL ULCER;  Surgeon: Loel Lofty Dillingham, DO;  Location: [facility];  Service: Plastics;  Laterality: N/A;  . INCISION AND DRAINAGE OF WOUND N/A 09/30/2014   Procedure: IRRIGATION AND DEBRIDEMENT SACRAL WOUND, EXCISION OF PERIRECTAL TRACT WITH PLACEMENT OF ACCELL;  Surgeon: Theodoro Kos, DO;  Location: [facility];  Service: Plastics;  Laterality: N/A;  . LIGATION OF INTERNAL FISTULA  TRACT N/A 10/22/2016   Procedure: LIGATION OF INTERNAL FISTULA TRACT;  Surgeon: Leighton Ruff, MD;  Location[facility];  Service: General;  Laterality: N/A;  . ORIF FEMUR FRACTURE Left 10/09/2012   Procedure: OPEN REDUCTION INTERNAL FIXATION (ORIF) DISTAL FEMUR FRACTURE;  Surgeon: Rozanna Box, MD;  Location: [facility];  Service: Orthopedics;  Laterality: Left;  . RETINOPATHY SURGERY Bilateral 1980's?  . TRANSTHORACIC ECHOCARDIOGRAM  02-18-2011   mild LVH,  ef 55-60%,  grade I diastolic dysfunction/  mild TR/  RV systolic pressure increased consistant with moderate pulmonary hypertension   Social History   Social History  .  Marital status: Married    Spouse name: [name]  . Number of children: 0  . Years of education: 12   Occupational History  . Retired Retired   Social History Main Topics  . Smoking status: Never Smoker  . Smokeless tobacco: Never Used  . Alcohol use No  . Drug use: No  . Sexual activity: Not Currently   Other Topics Concern  . None   Social History Narrative   Lives with husband.    Caffeine use: none   Outpatient Encounter Prescriptions as of 03/24/2017  Medication Sig  . amLODipine (NORVASC) 5 MG tablet Take 1 tablet (5 mg total) by mouth daily.  Marland Kitchen atorvastatin (LIPITOR) 10 MG tablet Take 1 tablet (10 mg total) by mouth daily.  . carvedilol (COREG) 6.25 MG tablet Take 1 tablet (6.25 mg total) by mouth 2 (two) times daily with a meal.  . Colchicine 0.6 MG CAPS Take by mouth as needed.   . febuxostat (ULORIC) 40 MG tablet Take 40 mg by mouth every morning.   . furosemide (LASIX) 40 MG tablet Take 40 mg by mouth.  Marland Kitchen glucose blood (ACCU-CHEK AVIVA) test strip Use 2 times daily to test glucose E11.65  . insulin degludec (TRESIBA FLEXTOUCH) 100 UNIT/ML SOPN FlexTouch Pen Inject 0.1 mLs (10 Units total) into the skin daily with breakfast.  . levothyroxine (SYNTHROID, LEVOTHROID) 75 MCG tablet TAKE 1 TABLET EVERY DAY BEFORE BREAKFAST  . pantoprazole (PROTONIX) 20 MG tablet Take 1 tablet (20 mg total) by mouth daily.  . traMADol (ULTRAM) 50 MG tablet Take 1-2 tablets (50-100 mg total) by mouth every 6 (six) hours as needed.  Marland Kitchen ULTICARE MICRO PEN NEEDLES 32G X 4 MM MISC   . [DISCONTINUED] insulin aspart protamine - aspart (NOVOLOG 70/30 MIX) (70-30) 100 UNIT/ML FlexPen Inject 10 Units into the skin daily with breakfast.   No facility-administered encounter medications on file as of 03/24/2017.    ALLERGIES: Allergies  Allergen Reactions  . Aspirin Nausea And Vomiting  . Ciprofloxacin Other (See Comments)    Upset Stomach  . Codeine Nausea And Vomiting    Makes me sick   . Losartan       Hyperkalemia   . Micardis [Telmisartan] Other (See Comments)    unknown  . Nexium [Esomeprazole Magnesium] Other (See Comments)    Causes internal bleeding  . Niaspan [Niacin Er] Other (See Comments)    Reaction is unknown  . Onglyza [Saxagliptin] Other (See Comments)    Reaction is unknown  . Other     No otc pain medications  . Rofecoxib Other (See Comments)    unknown  . Simvastatin   . Tequin [Gatifloxacin] Other (See Comments)    Reaction is unknown  . Welchol [Colesevelam Hcl]   . Amoxicillin Nausea And Vomiting and Rash    Has patient had a PCN  reaction causing immediate rash, facial/tongue/throat swelling, SOB or lightheadedness with hypotension: Yes Has patient had a PCN reaction causing severe rash involving mucus membranes or skin necrosis: no  Has patient had a PCN reaction that required hospitalization No Has patient had a PCN reaction occurring within the last 10 years: No If all of the above answers are "NO", then may proceed with Cephalosporin use.    VACCINATION STATUS: Immunization History  Administered Date(s) Administered  . Influenza, High Dose Seasonal PF 03/05/2016, 03/14/2017  . Influenza,inj,Quad PF,6+ Mos 02/16/2013, 02/27/2015  . Pneumococcal Conjugate-13 10/11/2014  . Pneumococcal Polysaccharide-23 10/10/2012  . Tdap 10/06/2012    Diabetes  She presents for her follow-up diabetic visit. She has type 2 diabetes mellitus. Onset time: She was diagnosed at approximate age of 69 years . Her disease course has been stable. There are no hypoglycemic associated symptoms. Pertinent negatives for hypoglycemia include no confusion, headaches, pallor or seizures. Associated symptoms include fatigue and visual change. Pertinent negatives for diabetes include no chest pain, no polydipsia and no polyphagia. There are no hypoglycemic complications. Symptoms are stable. Diabetic complications include nephropathy, peripheral neuropathy, PVD and retinopathy. Risk  factors for coronary artery disease include diabetes mellitus, dyslipidemia, hypertension and sedentary lifestyle. Current diabetic treatment includes oral agent (monotherapy). She is compliant with treatment most of the time. Weight trend: Wheelchair-bound. She is following a generally unhealthy diet. When asked about meal planning, she reported none. She has not had a previous visit with a dietitian. She never participates in exercise. Her home blood glucose trend is increasing steadily (Her glucose profile today is acceptable between 100- 180 mg per DL average.). Her breakfast blood glucose range is generally 130-140 mg/dl. Her dinner blood glucose range is generally 140-180 mg/dl. Her overall blood glucose range is 140-180 mg/dl. An ACE inhibitor/angiotensin II receptor blocker is being taken. Eye exam is current.  Thyroid Problem  Presents for initial visit. Onset time: 15 years. Symptoms include fatigue and visual change. Patient reports no cold intolerance, diarrhea, heat intolerance or palpitations. The symptoms have been stable. Past treatments include levothyroxine. The following procedures have not been performed: thyroidectomy.  Hypertension  This is a chronic problem. The current episode started more than 1 year ago. Pertinent negatives include no chest pain, headaches, palpitations or shortness of breath. Past treatments include angiotensin blockers. Hypertensive end-organ damage includes PVD and retinopathy. Identifiable causes of hypertension include a thyroid problem.      Review of Systems  Constitutional: Positive for fatigue. Negative for unexpected weight change.  HENT: Negative for trouble swallowing and voice change.   Eyes: Negative for visual disturbance.  Respiratory: Negative for cough, shortness of breath and wheezing.   Cardiovascular: Negative for chest pain, palpitations and leg swelling.  Gastrointestinal: Negative for diarrhea, nausea and vomiting.  Endocrine:  Negative for cold intolerance, heat intolerance, polydipsia and polyphagia.  Musculoskeletal: Positive for arthralgias, back pain, gait problem, joint swelling and myalgias.          Skin: Negative for color change, pallor, rash and wound.  Neurological: Negative for seizures and headaches.  Psychiatric/Behavioral: Negative for confusion and suicidal ideas.    Objective:    BP 130/71   Pulse 76   Ht _0  (1.575 m)   Wt 154 lb (69.9 kg)   BMI 28.17 kg/m   Wt Readings from Last 3 Encounters:  03/24/17 154 lb (69.9 kg)  03/14/17 153 lb (69.4 kg)  12/20/16 155 lb 12.8 oz (70.7 kg)    Physical  Exam  Constitutional: She is oriented to person, place, and time. She appears well-developed.  HENT:  Head: Normocephalic and atraumatic.  Eyes: EOM are normal.  Neck: Normal range of motion. Neck supple. No tracheal deviation present. No thyromegaly present.  Cardiovascular: Normal rate and regular rhythm.  Exam reveals decreased pulses.   Pulses:      Dorsalis pedis pulses are 0 on the right side, and 0 on the left side.       Posterior tibial pulses are 0 on the right side, and 0 on the left side.  Pulmonary/Chest: Effort normal and breath sounds normal.  Abdominal: Soft. Bowel sounds are normal. There is no tenderness. There is no guarding.  Musculoskeletal: She exhibits no edema.       Arms: She is wheelchair-bound due to recent hip fracture and diffuse debilitating arthritis.  Neurological: She is alert and oriented to person, place, and time. She has normal reflexes. A sensory deficit is present. No cranial nerve deficit. Coordination normal.  Skin: Skin is warm and dry. No rash noted. No erythema. No pallor.  Psychiatric: She has a normal mood and affect. Judgment normal.    Results for orders placed or performed in visit on 03/14/17  Bayer DCA Hb A1c Waived  Result Value Ref Range   Bayer DCA Hb A1c Waived 7.0 (H) <7.0 %  CMP14+EGFR  Result Value Ref Range   Glucose 196 (H)  65 - 99 mg/dL   BUN 42 (H) 8 - 27 mg/dL   Creatinine, Ser 1.44 (H) 0.57 - 1.00 mg/dL   GFR calc non Af Amer 37 (L) >59 mL/min/1.73   GFR calc Af Amer 42 (L) >59 mL/min/1.73   BUN/Creatinine Ratio 29 (H) 12 - 28   Sodium 137 134 - 144 mmol/L   Potassium 4.6 3.5 - 5.2 mmol/L   Chloride 102 96 - 106 mmol/L   CO2 20 20 - 29 mmol/L   Calcium 9.2 8.7 - 10.3 mg/dL   Total Protein 6.5 6.0 - 8.5 g/dL   Albumin 4.0 3.5 - 4.8 g/dL   Globulin, Total 2.5 1.5 - 4.5 g/dL   Albumin/Globulin Ratio 1.6 1.2 - 2.2   Bilirubin Total 0.4 0.0 - 1.2 mg/dL   Alkaline Phosphatase 111 39 - 117 IU/L   AST 13 0 - 40 IU/L   ALT 6 0 - 32 IU/L  Lipid panel  Result Value Ref Range   Cholesterol, Total 160 100 - 199 mg/dL   Triglycerides 170 (H) 0 - 149 mg/dL   HDL 37 (L) >39 mg/dL   VLDL Cholesterol Cal 34 5 - 40 mg/dL   LDL Calculated 89 0 - 99 mg/dL   Chol/HDL Ratio 4.3 0.0 - 4.4 ratio  Thyroid Panel With TSH  Result Value Ref Range   TSH 0.473 0.450 - 4.500 uIU/mL   T4, Total 7.3 4.5 - 12.0 ug/dL   T3 Uptake Ratio 33 24 - 39 %   Free Thyroxine Index 2.4 1.2 - 4.9   Diabetic Labs (most recent): Lab Results  Component Value Date   HGBA1C 7.7 12/02/2016   HGBA1C 6.6 (H) 05/05/2016   HGBA1C 7.3 (H) 11/21/2015   Lipid Panel     Component Value Date/Time   CHOL 160 03/14/2017 0824   CHOL 105 09/11/2012 1057   TRIG 170 (H) 03/14/2017 0824   TRIG 170 (H) 10/11/2014 1159   TRIG 79 09/11/2012 1057   HDL 37 (L) 03/14/2017 0824   HDL 53 10/11/2014 1159  HDL 41 09/11/2012 1057   CHOLHDL 4.3 03/14/2017 0824   LDLCALC 89 03/14/2017 0824   LDLCALC 50 02/14/2014 1012   LDLCALC 48 09/11/2012 1057     Assessment & Plan:   1. Type 2 diabetes mellitus with stage 4 chronic kidney disease, without long-term current use of insulin ([place])   - Patient has currently uncontrolled symptomatic type 2 DM since  71 years of age,  with most recent A1c of 7% , generally improving from  10 %. Recent labs reviewed. -  Her renal function is improving.   -her  diabetes is complicated by PAD, CKD, neuropathy and patient remains at a high risk for more acute and chronic complications of diabetes which include CAD, CVA, CKD, retinopathy, and neuropathy. These are all discussed in detail with the patient.  - I have counseled the patient on diet management  by adopting a carbohydrate restricted/protein rich diet.  - Suggestion is made for patient to avoid simple carbohydrates   from her diet including Cakes , Desserts, Ice Cream,  Soda (  diet and regular) , Sweet Tea , Candies,  Chips, Cookies, Artificial Sweeteners,   and "Sugar-free" Products . This will help patient to have stable blood glucose profile and potentially avoid unintended weight gain.  - I encouraged the patient to switch to  unprocessed or minimally processed complex starch and increased protein intake (animal or plant source), fruits, and vegetables.  - Patient is advised to stick to a routine mealtimes to eat 3 meals  a day and avoid unnecessary snacks ( to snack only to correct hypoglycemia).   - I have approached patient with the following individualized plan to manage diabetes and patient agrees:   - She came with better and safer blood glucose profile .  -Her husband continues to offer to help. - She is currently finishing her supply of NovoLog 70/30. I discussed and switched her insulin to long-acting basal insulin, Tresiba 10 units every morning with breakfast, associated with strict monitoring of blood glucose 2 times a day-before breakfast and daily at bedtime. - First priority in this patient will be to avoid hypoglycemia.  -Patient is encouraged to call clinic for blood glucose levels less than 70 or above 300 mg /dl.  -Patient is not a candidate for MTF, Incretin therapy.  Her renal function is progressively improving.  - Patient specific target  A1c;  LDL, HDL, Triglycerides, and  Waist Circumference were discussed in detail.  2)  BP/HTN: Controlled. Continue current medications including ACEI/ARB. 3) Lipids/HPL:  continue statins. 4)  Weight/Diet: CDE Consult has been initiated, she has limited ability to exercise.  5)  Hypothyroidism:  - Her current thyroid function tests are consistent with appropriate replacement. I advised her to continue levothyroxine 75 g by mouth every morning.  - We discussed about correct intake of levothyroxine, at fasting, with water, separated by at least 30 minutes from breakfast, and separated by more than 4 hours from calcium, iron, multivitamins, acid reflux medications (PPIs). -Patient is made aware of the fact that thyroid hormone replacement is needed for life, dose to be adjusted by periodic monitoring of thyroid function tests.  5) Chronic Care/Health Maintenance:  -Patient is  on ACEI/ARB and Statin medications and encouraged to continue to follow up with Ophthalmology, Podiatrist at least yearly or according to recommendations, and advised to   stay away from smoking. I have recommended yearly flu vaccine and pneumonia vaccination at least every 5 years; moderate intensity exercise for up  to 150 minutes weekly; and  sleep for at least 7 hours a day.  - I advised patient to maintain close follow up with Eustaquio Maize, MD for primary care needs.  - Time spent with the patient: 25 min, of which >50% was spent in reviewing her sugar logs , discussing her hypo- and hyper-glycemic episodes, reviewing her current and  previous labs and insulin doses and developing a plan to avoid hypo- and hyper-glycemia.   Follow up plan: - Return in about 3 months (around 06/24/2017) for follow up with pre-visit labs, meter, and logs.  Glade Lloyd, MD Phone: [phone]  Fax: [phone]  -  This note was partially dictated with voice recognition software. Similar sounding words can be transcribed inadequately or may not  be corrected upon review.  03/24/2017, 12:54 PM

## 2017-03-25 ENCOUNTER — Telehealth: Payer: Self-pay | Admitting: "Endocrinology

## 2017-03-25 ENCOUNTER — Other Ambulatory Visit: Payer: Self-pay | Admitting: "Endocrinology

## 2017-03-25 MED ORDER — INSULIN DETEMIR 100 UNIT/ML FLEXPEN: 10.0000 [IU] | PEN_INJECTOR | Freq: Every day | SUBCUTANEOUS | 2 refills | Status: DC

## 2017-03-25 MED ORDER — INSULIN DEGLUDEC 100 UNIT/ML ~~LOC~~ SOPN: 10.0000 [IU] | PEN_INJECTOR | Freq: Every day | SUBCUTANEOUS | 2 refills | Status: DC

## 2017-03-25 NOTE — Telephone Encounter (Signed)
She states she is very upset today because the nurse was supposed to have handled this yesterday but nothing was done

## 2017-03-25 NOTE — Telephone Encounter (Signed)
Patient is aware of the change

## 2017-03-25 NOTE — Telephone Encounter (Signed)
[name] is calling stating that she can not afford the [place] after [employer], what else can it be changed to, Please advise?

## 2017-03-25 NOTE — Telephone Encounter (Signed)
I sent a rx for Levemir.

## 2017-03-25 NOTE — Telephone Encounter (Signed)
Done

## 2017-03-25 NOTE — Telephone Encounter (Signed)
Please change Rx back to the [place] per patient

## 2017-04-04 DIAGNOSIS — H35352 Cystoid macular degeneration, left eye: Secondary | ICD-10-CM | POA: Diagnosis not present

## 2017-04-04 DIAGNOSIS — E113553 Type 2 diabetes mellitus with stable proliferative diabetic retinopathy, bilateral: Secondary | ICD-10-CM | POA: Diagnosis not present

## 2017-04-04 DIAGNOSIS — H3562 Retinal hemorrhage, left eye: Secondary | ICD-10-CM | POA: Diagnosis not present

## 2017-04-04 DIAGNOSIS — H353132 Nonexudative age-related macular degeneration, bilateral, intermediate dry stage: Secondary | ICD-10-CM | POA: Diagnosis not present

## 2017-05-07 ENCOUNTER — Other Ambulatory Visit: Payer: Self-pay | Admitting: "Endocrinology

## 2017-05-09 DIAGNOSIS — N183 Chronic kidney disease, stage 3 (moderate): Secondary | ICD-10-CM | POA: Diagnosis not present

## 2017-05-09 DIAGNOSIS — N184 Chronic kidney disease, stage 4 (severe): Secondary | ICD-10-CM | POA: Diagnosis not present

## 2017-05-09 DIAGNOSIS — M109 Gout, unspecified: Secondary | ICD-10-CM | POA: Diagnosis not present

## 2017-05-09 DIAGNOSIS — N2581 Secondary hyperparathyroidism of renal origin: Secondary | ICD-10-CM | POA: Diagnosis not present

## 2017-05-09 DIAGNOSIS — I10 Essential (primary) hypertension: Secondary | ICD-10-CM | POA: Diagnosis not present

## 2017-05-09 DIAGNOSIS — D631 Anemia in chronic kidney disease: Secondary | ICD-10-CM | POA: Diagnosis not present

## 2017-05-09 DIAGNOSIS — I129 Hypertensive chronic kidney disease with stage 1 through stage 4 chronic kidney disease, or unspecified chronic kidney disease: Secondary | ICD-10-CM | POA: Diagnosis not present

## 2017-05-09 DIAGNOSIS — E1122 Type 2 diabetes mellitus with diabetic chronic kidney disease: Secondary | ICD-10-CM | POA: Diagnosis not present

## 2017-05-09 DIAGNOSIS — R809 Proteinuria, unspecified: Secondary | ICD-10-CM | POA: Diagnosis not present

## 2017-05-09 DIAGNOSIS — I150 Renovascular hypertension: Secondary | ICD-10-CM | POA: Diagnosis not present

## 2017-05-30 ENCOUNTER — Telehealth: Payer: Self-pay | Admitting: Pediatrics

## 2017-05-30 DIAGNOSIS — E782 Mixed hyperlipidemia: Secondary | ICD-10-CM

## 2017-05-30 DIAGNOSIS — I10 Essential (primary) hypertension: Secondary | ICD-10-CM

## 2017-05-30 MED ORDER — ATORVASTATIN CALCIUM 10 MG PO TABS: 10.0000 mg | ORAL_TABLET | Freq: Every day | ORAL | 0 refills | Status: DC

## 2017-05-30 MED ORDER — AMLODIPINE BESYLATE 5 MG PO TABS: 5.0000 mg | ORAL_TABLET | Freq: Every day | ORAL | 0 refills | Status: DC

## 2017-05-30 MED ORDER — CARVEDILOL 6.25 MG PO TABS
6.2500 mg | ORAL_TABLET | Freq: Two times a day (BID) | ORAL | 0 refills | Status: DC
Start: 2017-05-30 — End: 2017-08-29

## 2017-05-30 NOTE — Telephone Encounter (Signed)
What is the name of the medication? Cholesterol med, and heart/bp pill  Have you contacted your pharmacy to request a refill? no  Which pharmacy would you like this sent to? [facility]   Patient notified that their request is being sent to the clinical staff for review and that they should receive a call once it is complete. If they do not receive a call within 24 hours they can check with their pharmacy or our office.

## 2017-05-30 NOTE — Telephone Encounter (Signed)
Pt aware refills sent to [facility] 

## 2017-05-31 DIAGNOSIS — M79676 Pain in unspecified toe(s): Secondary | ICD-10-CM | POA: Diagnosis not present

## 2017-05-31 DIAGNOSIS — E1151 Type 2 diabetes mellitus with diabetic peripheral angiopathy without gangrene: Secondary | ICD-10-CM | POA: Diagnosis not present

## 2017-05-31 DIAGNOSIS — B351 Tinea unguium: Secondary | ICD-10-CM | POA: Diagnosis not present

## 2017-05-31 DIAGNOSIS — L84 Corns and callosities: Secondary | ICD-10-CM | POA: Diagnosis not present

## 2017-06-13 DIAGNOSIS — M1A09X1 Idiopathic chronic gout, multiple sites, with tophus (tophi): Secondary | ICD-10-CM | POA: Diagnosis not present

## 2017-06-13 DIAGNOSIS — E663 Overweight: Secondary | ICD-10-CM | POA: Diagnosis not present

## 2017-06-13 DIAGNOSIS — Z6828 Body mass index (BMI) 28.0-28.9, adult: Secondary | ICD-10-CM | POA: Diagnosis not present

## 2017-06-13 DIAGNOSIS — N183 Chronic kidney disease, stage 3 (moderate): Secondary | ICD-10-CM | POA: Diagnosis not present

## 2017-06-13 DIAGNOSIS — Z79899 Other long term (current) drug therapy: Secondary | ICD-10-CM | POA: Diagnosis not present

## 2017-06-13 DIAGNOSIS — M2550 Pain in unspecified joint: Secondary | ICD-10-CM | POA: Diagnosis not present

## 2017-06-22 ENCOUNTER — Ambulatory Visit: Payer: Medicare Other | Admitting: Pediatrics

## 2017-06-24 ENCOUNTER — Other Ambulatory Visit: Payer: Self-pay

## 2017-06-24 ENCOUNTER — Encounter: Payer: Self-pay | Admitting: Pediatrics

## 2017-06-24 ENCOUNTER — Ambulatory Visit (INDEPENDENT_AMBULATORY_CARE_PROVIDER_SITE_OTHER): Payer: Medicare Other | Admitting: Pediatrics

## 2017-06-24 VITALS — BP 119/67 | HR 69 | Temp 97.3°F | Ht 62.0 in | Wt 156.8 lb

## 2017-06-24 DIAGNOSIS — Z794 Long term (current) use of insulin: Secondary | ICD-10-CM

## 2017-06-24 DIAGNOSIS — E039 Hypothyroidism, unspecified: Secondary | ICD-10-CM

## 2017-06-24 DIAGNOSIS — E1122 Type 2 diabetes mellitus with diabetic chronic kidney disease: Secondary | ICD-10-CM | POA: Diagnosis not present

## 2017-06-24 DIAGNOSIS — K219 Gastro-esophageal reflux disease without esophagitis: Secondary | ICD-10-CM

## 2017-06-24 DIAGNOSIS — N184 Chronic kidney disease, stage 4 (severe): Secondary | ICD-10-CM

## 2017-06-24 DIAGNOSIS — E782 Mixed hyperlipidemia: Secondary | ICD-10-CM | POA: Diagnosis not present

## 2017-06-24 DIAGNOSIS — I10 Essential (primary) hypertension: Secondary | ICD-10-CM

## 2017-06-24 LAB — BAYER DCA HB A1C WAIVED: HB A1C: 6.6 % (ref <7.0)

## 2017-06-24 NOTE — Telephone Encounter (Signed)
This encounter was created in error - please disregard.

## 2017-06-24 NOTE — Progress Notes (Signed)
  Subjective:   Patient ID: [name], female    DOB: Jun 04, 1945, 72 y.o.   MRN: [id number] CC: Follow-up (3 month) multiple med problems HPI: [name] is a 72 y.o. female presenting for Follow-up (3 month)  CKD: follows with nephrology  Gout: follows with rheumatology, no recent flares  DM2: follows with Dr Dorris Fetch AM BGL in low 100s Denies any recent lows Has upcoming appt with him next week Wants A1c drawn today Insulin has gotten very expensive  Hypothyroidism: on levothyroxine, taking regularly  HTN: 127/77 at rheum last week 120s-130s SBP at home  HLD: taking atorvastatin regularly, no s/e  Lots of stress at home, husband's brother and pt's sister both in poor health  Relevant past medical, surgical, family and social history reviewed. Allergies and medications reviewed and updated. Social History   Tobacco Use  Smoking Status Never Smoker  Smokeless Tobacco Never Used   ROS: Per HPI   Objective:    BP 119/67   Pulse 69   Temp (!) 97.3 F (36.3 C) (Oral)   Ht 5\' 2"  (1.575 m)   Wt 156 lb 12.8 oz (71.1 kg)   BMI 28.68 kg/m   Wt Readings from Last 3 Encounters:  06/24/17 156 lb 12.8 oz (71.1 kg)  03/24/17 154 lb (69.9 kg)  03/14/17 153 lb (69.4 kg)    Gen: NAD, alert, cooperative with exam, NCAT EYES: EOMI, no conjunctival injection, or no icterus ENT:  TMs pearly gray b/l, OP without erythema LYMPH: no cervical LAD CV: NRRR, normal S1/S2, no murmur, distal pulses 2+ b/l Resp: CTABL, no wheezes, normal WOB Abd: +BS, soft, NTND. no guarding or organomegaly Ext: No edema, warm Neuro: Alert and oriented, strength equal b/l UE and LE, coordination grossly normal MSK: normal muscle bulk  Assessment & Plan:  [name] was seen today for follow-up med problems.  Diagnoses and all orders for this visit:  Type 2 diabetes mellitus with stage 4 chronic kidney disease, with long-term current use of insulin (HCC) A1c 6.6, has f/u appt with endocrinology  upcoming Insulin has gotten too expensive, will put in referral to [facility] to see if qualifies for pt assistance -     Microalbumin / creatinine urine ratio -     Bayer DCA Hb A1c Waived  Hypothyroidism, unspecified type Last TSH wnl, cont med  Gastroesophageal reflux disease without esophagitis Stable, taking PPI most days  Essential hypertension Initially elevated, improved with recheck On amlodipine and carvedilol  Mixed hyperlipidemia Taking statin daily  Follow up plan: No Follow-up on file. Assunta Found, MD [place]

## 2017-06-24 NOTE — Patient Outreach (Addendum)
[facility]) Care Management  06/24/2017  [name] 03-Feb-1946 [id number]   Telephone Screen  Referral Date: 06/24/17 Referral Source: MD office Referral Reason: "consult for medications, too expensive especially insulin" Insurance: Medicare   Outreach attempt #  1 to patient. Spoke with patient and screening completed.    Social: Patient resides in her home along with spouse. She voices that she is independent with ADLs and most IADLs. She reports that she does not drive. Spouse takes her to appts when he is available. She voices that spouse is retired Games developer but still works some. Patient voices she need alternative transportation options for when spouse not available to take her. She denies any recent falls. DME in the home include cane, walker, cbg meter and BP monitor.    Conditions:Patient has PMH of DM. CKD, Gout, hypothyroidism, GERD and HLD. She reports that spouse helps her manage her conditions. Spouse is checking and administering her insulin to her daily. Patient had A1C checked at MD appt today and it was 6.6. She is agreeable to further education and support to keep Diabetes under control.  Medications: Patient reports she is taking about seven meds. She voices that her insulin(Tresiba) is too expensive and she has trouble affording med. Per patient insulin costs $240.00/month. She reports that she is able to afford the rest of her meds. Her spouse helps her manage her meds and gives her meds to her to take.  Appointments: She saw PCP today. She is also followed by Dr. Moshe Cipro (renal), Dr. Leafy Kindle (rheumatologist) and Dr. Nida(endocrinologist). She has appt with Dr. Dorris Fetch on 06/29/17.   Advance Directives: Patient reported that her spouse was her surrogate decision maker. No copy on file.   Consent: [facility] services reviewed and discussed with patient. Patient gave verbal consent for services.    Plan: RN CM will notify [facility] administrative  assistant of case status. RN CM will send [facility] SW referral for possible transportation assistance. RN CM will send [facility] pharmacy referral for possible med assistance. RN CM will send [place] for further disease management and education.   Enzo Montgomery, RN,BSN,CCM [place] Management Telephonic Care Management Coordinator Direct Phone: [phone] Toll Free: [phone] Fax: [phone]

## 2017-06-25 LAB — MICROALBUMIN / CREATININE URINE RATIO
Creatinine, Urine: 54 mg/dL
MICROALBUM., U, RANDOM: 25.2 ug/mL
Microalb/Creat Ratio: 46.7 mg/g creat — ABNORMAL HIGH (ref 0.0–30.0)

## 2017-06-27 ENCOUNTER — Other Ambulatory Visit: Payer: Self-pay

## 2017-06-27 ENCOUNTER — Other Ambulatory Visit: Payer: Self-pay | Admitting: Pharmacist

## 2017-06-27 NOTE — Patient Outreach (Signed)
[place] [facility]) Care Management  06/27/2017  MAKELLA BUCKINGHAM 1946/01/13 [id number]   1st telephone call to the patient for an initial assessment. No answer. HIPAA compliant message left with contact  Information.  Plan: RN Health Coach will make an outreach attempt to the patient within one business day.  [name] RN, BSN, [place] Direct Dial:  [phone] Fax: [phone]

## 2017-06-28 ENCOUNTER — Other Ambulatory Visit: Payer: Self-pay | Admitting: Licensed Clinical Social Worker

## 2017-06-28 ENCOUNTER — Encounter: Payer: Self-pay | Admitting: Pharmacist

## 2017-06-28 ENCOUNTER — Other Ambulatory Visit: Payer: Self-pay

## 2017-06-28 NOTE — Patient Outreach (Signed)
[facility]) Care Management  06/28/2017  CHANNEL PAPANDREA 02/09/1946 [id number]   2nd telephone call to the patient for initial assessment. No answer. HIPAA compliant message left with contact information.  Plan:  RN Health Coach will make an outreach attempt to the patient in the month of February.   [name] RN, BSN, [place] Direct Dial:  [phone] Fax: [phone]

## 2017-06-28 NOTE — Patient Outreach (Signed)
[facility]) Care Management  [place]   06/28/2017  [name] [birth year] [id number]  Subjective: Patient was called regarding referral for medication assistance. HIPAA identifiers were obtained. Patient is a 72 year old female with multiple medical conditions including but not limited to:  Anxiety, CKD stage 3, depression, type 2 diabetes, hypertension, GERD, hyperlipidemia, osteoporosis, and history of hip fracture with operative repair.    Patient has a Special educational needs teacher with [employer] and uses their mail order pharmacy to have her medications delivered to her home.  Objective:   Encounter Medications: Outpatient Encounter Medications as of 06/27/2017  Medication Sig  . ACCU-CHEK SOFTCLIX LANCETS lancets USE AS INSTRUCTED 4 X DAILY, E11.65  . amLODipine (NORVASC) 5 MG tablet Take 1 tablet (5 mg total) by mouth daily.  Marland Kitchen atorvastatin (LIPITOR) 10 MG tablet Take 1 tablet (10 mg total) by mouth daily.  . carvedilol (COREG) 6.25 MG tablet Take 1 tablet (6.25 mg total) by mouth 2 (two) times daily with a meal.  . furosemide (LASIX) 40 MG tablet Take 40 mg by mouth.  Marland Kitchen glucose blood (ACCU-CHEK AVIVA) test strip Use 2 times daily to test glucose E11.65  . insulin degludec (TRESIBA FLEXTOUCH) 100 UNIT/ML SOPN FlexTouch Pen Inject 0.1 mLs (10 Units total) into the skin daily with breakfast.  . levothyroxine (SYNTHROID, LEVOTHROID) 75 MCG tablet TAKE 1 TABLET EVERY DAY BEFORE BREAKFAST  . pantoprazole (PROTONIX) 20 MG tablet Take 1 tablet (20 mg total) by mouth daily.  Marland Kitchen ULTICARE MICRO PEN NEEDLES 32G X 4 MM MISC    No facility-administered encounter medications on file as of 06/27/2017.     Functional Status: In your present state of health, do you have any difficulty performing the following activities: 10/22/2016 07/29/2016  Hearing? N N  Vision? N N  Difficulty concentrating or making decisions? N N  Walking or climbing stairs? Y N  Dressing or bathing? Y N   Some recent data might be hidden    Fall/Depression Screening: Fall Risk  06/24/2017 06/24/2017 03/14/2017  Falls in the past year? No No No  Number falls in past yr: - - -  Injury with Fall? - - -  Comment - - -  Risk Factor Category  - - -  Risk for fall due to : - - -  Follow up - - -   PHQ 2/9 Scores 06/24/2017 06/24/2017 03/14/2017 12/02/2016 09/20/2016 09/03/2016 07/29/2016  PHQ - 2 Score 0 0 0 0 0 0 0  PHQ- 9 Score - - - - - - -      Assessment:  Patient's medications were reviewed via telephone.     Drugs sorted by system:  Neurologic/Psychologic:   Cardiovascular: Amlodipine Atorvastatin Carvedilol Furosemide   Pulmonary/Allergy:  Gastrointestinal: Pantoprazole  Endocrine: [name] Levothyroxine   Medication Review Findings:  -Additional Therapy Needed (Potentially) Patient's problem list states she has osteoporosis and there was a DEXA scan ordered in 2014.  Patient has had a hip fracture and there are some notes from May of 2014 discussing vitamin D and calcium therapy.    Vitamin D or Calcium are not on the patient's medication list and she confirmed she is not taking either supplement.  Patient is also taking a PPI and they are known to decrease bone minerals.   Medication Assistance Findings:  -Patient and her husband are over income for the Extra Help Program provided by Social Security  -Patient may qualify to receive [place] from [employer]  but they require patient's to spend at least $1000 out of pocket in medication expenses. Out side of [name], the patient is taking mostly tier 1-2 medications.  -Patient reported her copay for [name] was >$200.  This is because with only using 10 units per day, a box of 5 pens will last the patient 150 days. Her insurance is charging her multiple copays due to the days supply (approx 4 copays).   According the [place] packaging, one pen is viable after opening for 8 weeks. One pen will last the patient thirty  days.       If the patient's dose is ever going to be titrated, her prescription could be written:  Inject 10 units every night ( dose will be titrated to maximum daily dose of ____units)  --This would have to be done by her provider if deemed therapeutically appropriate.  Writing the prescription this way would allow the billed days supply to be less and hence the patient's copay to be less.     Plan:  Dr. Liliane Channel office was called and the situation was explained. A note will be routed to Dr. Dorris Fetch as the patient has an appointment with him on 06/29/17. Will follow up with the patient after her visit.

## 2017-06-28 NOTE — Patient Outreach (Signed)
Assessment:  CSW received referral on [name].  CSW called [name] on 06/28/17 and spoke via phone with Acquanetta Belling. [place]. CSW verified identity of Lian Pounds. [place]. CSW received verbal permission from [place]. [place] on 06/28/17 for CSW to speak with Al[place] about transportation needs of [employer] [place].  CSW introduced self and talked with Sister about [facility] program services in nursing, social work and pharmacy.  Icess said that transport for her to go to and from her medical appointments is challenging for [place].  CSW informed Adriene of Aging, Disability and Transient Services, [place], [place], [place], [place] (phone number [phone]). CSW informed Angelina of [employer] , [address], [place], [place] (phone number: [phone]).  CSW informed Jerilee of [employer] transport agency, [place], [place] (phone number: [phone]).  CSW also gave client the phone number for [facility] for transport scheduling, as appropriate, at [phone]. Tayja was appreciative of this information regarding transport agencies in the area.  She wrote down the names and phone numbers of the above transport services agencies. She said her spouse sometimes transports her to and from her medical appointments but that she needs an alternative transport source to help transport her to and from medical appointments when her spouse is not able to drive her. She said she had no other CSW needs at present. CSW informed [place] that CSW would close out, discharge Adalay Azucena. [place] from [place] services on 06/28/17 since she now had transport information needed by client and had no other CSW needs. Acquanetta Belling. [place] agreed to this plan.  CSW thanked Finnlee for phone call witih CSW on 06/28/17.   needed.     Plan:  CSW is discharging [employer] [place] from [place] services on 06/28/17 since client has transport information needed by client and has no other CSW needs at present.  Acquanetta Belling.  [place] to contact the above transport agencies, as needed, to discuss transport support for client to go to and from client's medical appointments.   [name].Chris Cripps MSW, LCSW Licensed Clinical Social Worker [facility] Care Management [phone]

## 2017-06-28 NOTE — Patient Outreach (Signed)
[facility]) Care Management  [place]  06/28/2017   [name] [birth year] [id number]  Subjective: Received return phone call from the patient for an initial assessment. HIPAA verified. The patient states that she lives with her husband [name].  She is independent with her ADLS but her husband assists her with her IADLS.  She states that she has pain in her legs and rates them at an 8/10.  She is not able to take over the counter medications because of her stomach issues. She denies and falls.  The patient states that she has vision problems and her husband has to manage her medications and any material that she receives they have to review together. The patients husband takes her to her appointments.  She states that she has a cane, CBG meter and blood pressure cuff.   The patient states that she does not have an advanced directive and does not want information sent to her. The patient states that she has an appointment with Dr Dorris Fetch on 06/29/2017 and will recheck her a1c.  Objective:   Encounter Medications:  Outpatient Encounter Medications as of 06/28/2017  Medication Sig  . ACCU-CHEK SOFTCLIX LANCETS lancets USE AS INSTRUCTED 4 X DAILY, E11.65  . amLODipine (NORVASC) 5 MG tablet Take 1 tablet (5 mg total) by mouth daily.  Marland Kitchen atorvastatin (LIPITOR) 10 MG tablet Take 1 tablet (10 mg total) by mouth daily.  . carvedilol (COREG) 6.25 MG tablet Take 1 tablet (6.25 mg total) by mouth 2 (two) times daily with a meal.  . furosemide (LASIX) 40 MG tablet Take 40 mg by mouth.  Marland Kitchen glucose blood (ACCU-CHEK AVIVA) test strip Use 2 times daily to test glucose E11.65  . insulin degludec (TRESIBA FLEXTOUCH) 100 UNIT/ML SOPN FlexTouch Pen Inject 0.1 mLs (10 Units total) into the skin daily with breakfast.  . levothyroxine (SYNTHROID, LEVOTHROID) 75 MCG tablet TAKE 1 TABLET EVERY DAY BEFORE BREAKFAST  . ULTICARE MICRO PEN NEEDLES 32G X 4 MM MISC   . pantoprazole (PROTONIX) 20 MG  tablet Take 1 tablet (20 mg total) by mouth daily. (Patient not taking: Reported on 06/28/2017)   No facility-administered encounter medications on file as of 06/28/2017.     Functional Status:  In your present state of health, do you have any difficulty performing the following activities: 10/22/2016 07/29/2016  Hearing? N N  Vision? N N  Difficulty concentrating or making decisions? N N  Walking or climbing stairs? Y N  Dressing or bathing? Y N  Some recent data might be hidden    Fall/Depression Screening: Fall Risk  06/28/2017 06/24/2017 06/24/2017  Falls in the past year? No No No  Number falls in past yr: - - -  Injury with Fall? - - -  Comment - - -  Risk Factor Category  - - -  Risk for fall due to : - - -  Follow up - - -   PHQ 2/9 Scores 06/28/2017 06/24/2017 06/24/2017 03/14/2017 12/02/2016 09/20/2016 09/03/2016  PHQ - 2 Score 0 0 0 0 0 0 0  PHQ- 9 Score - - - - - - -    Assessment: Patient will benefit from health coach outreach for disease management and support.   [facility] CM Care Plan Problem One     Most Recent Value  Care Plan Problem One  Knowledge deficit of diabetes management  Role Documenting the Problem One  Health Coach  Care Plan for Problem One  Active  [facility]  Long Term Goal   In 90 days the patient will verbalized that she has mantained her a1c level of 6.9  THN Long Term Goal Start Date  06/28/17  Interventions for Problem One [place] will send educational information to review with her husband  THN CM Short Term Goal #1   In 30 days the patient will verbalize two foods to include in her diabetic diet  THN CM Short Term Goal #1 Start Date  06/28/17  Interventions for Short Term Goal #1  [place] will send the patient an EMMI on Diabetic diet  THN CM Short Term Goal #2   In the next 30 days the patient will verbalize that she has not had any lows  THN CM Short Term Goal #2 Start Date  06/28/17  Interventions for Short Term Goal #2  [place] will send the patient EMMI on using insulin.     Plan: RN Health Coach will provide ongoing education for patient on diabetes through phone calls and sending printed information to patient for further discussion.  RN Health Coach will send welcome packet with consent to patient as well as printed information on diabetes.  RN Health Coach will send initial barriers letter, assessment, and care plan to primary care physician. RN Health Coach will contact patient in the month of February and patient agrees to next outreach.  [name] RN, BSN, [place] Direct Dial:  [phone] Fax: [phone]

## 2017-06-29 ENCOUNTER — Encounter: Payer: Self-pay | Admitting: "Endocrinology

## 2017-06-29 ENCOUNTER — Ambulatory Visit (INDEPENDENT_AMBULATORY_CARE_PROVIDER_SITE_OTHER): Payer: Medicare Other | Admitting: "Endocrinology

## 2017-06-29 VITALS — BP 139/73 | HR 75 | Ht 62.0 in | Wt 157.0 lb

## 2017-06-29 DIAGNOSIS — E1122 Type 2 diabetes mellitus with diabetic chronic kidney disease: Secondary | ICD-10-CM | POA: Diagnosis not present

## 2017-06-29 DIAGNOSIS — N184 Chronic kidney disease, stage 4 (severe): Secondary | ICD-10-CM

## 2017-06-29 DIAGNOSIS — E039 Hypothyroidism, unspecified: Secondary | ICD-10-CM | POA: Diagnosis not present

## 2017-06-29 DIAGNOSIS — Z794 Long term (current) use of insulin: Secondary | ICD-10-CM

## 2017-06-29 DIAGNOSIS — E782 Mixed hyperlipidemia: Secondary | ICD-10-CM | POA: Diagnosis not present

## 2017-06-29 DIAGNOSIS — I10 Essential (primary) hypertension: Secondary | ICD-10-CM | POA: Diagnosis not present

## 2017-06-29 NOTE — Progress Notes (Signed)
Subjective:    Patient ID: [name], female    DOB: 1946-03-04. Patient is to follow-up for uncontrolled type 2 diabetes with her meter and logs.  Past Medical History:  Diagnosis Date  . Anal fistula   . Anemia in chronic renal disease    Aranesp injection --  when Hg <11, last injection 12-24-14.  Marland Kitchen Anxiety   . Arthritis    knees and hand/fingers. "broke back"-being evaluated for this"weakness left leg"  . 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 CKD (chronic kidney disease), stage III [facility])    nephrologist--  dr Mercy Moore-- [place]  07-09-2016  . Complication of anesthesia    post-op confusion   . Constipation   . Coronary artery disease   . Diabetic gastroparesis ([place])   . Diabetic retinopathy ([place])    bilateral --  monitored by dr [name]  . Diverticulosis of colon   . GAD (generalized anxiety disorder)   . GERD (gastroesophageal reflux disease)   . History of colon polyps    benign  . History of esophagitis   . History of GI bleed    upper 2009  due to esophagitis  &  2002  due to Mallory-Weiss tear  . History of hyperkalemia    pt had previously been canceled twice dos due to elevated K+, 07-29-2016 last date cancelled -- pt visited pcp same day treated w/ kayexelate and K+ came down, pt brought all her medication's in to pcp office and found pt was taking losartan that had been discontinued due to ckd, pcp stated this was cause of elevated K+  . 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 History of rectal abscess    12-29-2004  bedside I & D  . Hyperlipidemia   . Hypertension   . Hypothyroidism   . LAFB (left anterior fascicular block)   . Right bundle branch block   . Sacral decubitus ulcer    since 2014- 01-02-15 remains with wound" gauze dressing changes daily.  . Type II diabetes mellitus ([place])    Past Surgical History:  Procedure Laterality Date  . APPLICATION OF A-CELL OF EXTREMITY N/A 04/07/2015   Procedure: A CELL PLACMENT;  Surgeon: Loel Lofty Dillingham, DO;  Location: [place];  Service: Plastics;  Laterality: N/A;  . CARDIOVASCULAR STRESS TEST  12-30-2004   normal perfusion study/  no ischemia or infartion/  normal LV wall function and wall motion , ef 66%  . CATARACT EXTRACTION W/ INTRAOCULAR LENS  IMPLANT, BILATERAL  1995  . COLONOSCOPY W/ POLYPECTOMY  last one 2008  . COMPRESSION HIP SCREW Right 05/18/2014   Procedure: COMPRESSION HIP;  Surgeon: Carole Civil, MD;  Location: [facility];  Service: Orthopedics;  Laterality: Right;  . ESOPHAGOGASTRODUODENOSCOPY  last one 01-09-2011  . EVALUATION UNDER ANESTHESIA WITH FISTULECTOMY N/A 01/06/2015   Procedure: EXAM UNDER ANESTHESIA , placement of seton;  Surgeon: Jackolyn Confer, MD;  Location: [facility];  Service: General;  Laterality: N/A;  . I&D EXTREMITY N/A 04/07/2015   Procedure: IRRIGATION AND DEBRIDEMENT ISCHIAL ULCER;  Surgeon: Loel Lofty Dillingham, DO;  Location: [place];  Service: Plastics;  Laterality: N/A;  . INCISION AND DRAINAGE OF WOUND N/A 09/30/2014   Procedure: IRRIGATION AND DEBRIDEMENT SACRAL WOUND, EXCISION OF PERIRECTAL TRACT WITH PLACEMENT OF ACCELL;  Surgeon: Theodoro Kos, DO;  Location: [place];  Service: Plastics;  Laterality: N/A;  . LIGATION OF INTERNAL FISTULA  TRACT N/A 10/22/2016   Procedure: LIGATION OF INTERNAL FISTULA TRACT;  Surgeon: Leighton Ruff, MD;  Location: [facility];  Service: General;  Laterality: N/A;  . ORIF FEMUR FRACTURE Left 10/09/2012   Procedure: OPEN REDUCTION INTERNAL FIXATION (ORIF) DISTAL FEMUR FRACTURE;  Surgeon: Rozanna Box, MD;  Location: [place];  Service: Orthopedics;  Laterality: Left;  . RETINOPATHY SURGERY Bilateral 1980's?  . TRANSTHORACIC ECHOCARDIOGRAM  02-18-2011   mild LVH,  ef 55-60%,  grade I diastolic dysfunction/  mild TR/  RV systolic pressure increased consistant with moderate pulmonary hypertension   Social History   Socioeconomic  History  . Marital status: Married    Spouse name: [name]  . Number of children: 0  . Years of education: 74  . Highest education level: None  Social Needs  . Financial resource strain: None  . Food insecurity - worry: None  . Food insecurity - inability: None  . Transportation needs - medical: None  . Transportation needs - non-medical: None  Occupational History  . Occupation: Retired    Fish farm manager: RETIRED  Tobacco Use  . Smoking status: Never Smoker  . Smokeless tobacco: Never Used  Substance and Sexual Activity  . Alcohol use: No  . Drug use: No  . Sexual activity: Not Currently  Other Topics Concern  . None  Social History Narrative   Lives with husband.    Caffeine use: none   Outpatient Encounter Medications as of 06/29/2017  Medication Sig  . ACCU-CHEK SOFTCLIX LANCETS lancets USE AS INSTRUCTED 4 X DAILY, E11.65  . amLODipine (NORVASC) 5 MG tablet Take 1 tablet (5 mg total) by mouth daily.  Marland Kitchen atorvastatin (LIPITOR) 10 MG tablet Take 1 tablet (10 mg total) by mouth daily.  . carvedilol (COREG) 6.25 MG tablet Take 1 tablet (6.25 mg total) by mouth 2 (two) times daily with a meal.  . furosemide (LASIX) 40 MG tablet Take 40 mg by mouth.  Marland Kitchen glucose blood (ACCU-CHEK AVIVA) test strip Use 2 times daily to test glucose E11.65  . insulin degludec (TRESIBA FLEXTOUCH) 100 UNIT/ML SOPN FlexTouch Pen Inject 0.1 mLs (10 Units total) into the skin daily with breakfast.  . levothyroxine (SYNTHROID, LEVOTHROID) 75 MCG tablet TAKE 1 TABLET EVERY DAY BEFORE BREAKFAST  . pantoprazole (PROTONIX) 20 MG tablet Take 1 tablet (20 mg total) by mouth daily. (Patient not taking: Reported on 06/28/2017)  . ULTICARE MICRO PEN NEEDLES 32G X 4 MM MISC    No facility-administered encounter medications on file as of 06/29/2017.    ALLERGIES: Allergies  Allergen Reactions  . Aspirin Nausea And Vomiting  . Ciprofloxacin Other (See Comments)    Upset Stomach  . Codeine Nausea And Vomiting    Makes me sick    . Losartan     Hyperkalemia   . Micardis [Telmisartan] Other (See Comments)    unknown  . Nexium [Esomeprazole Magnesium] Other (See Comments)    Causes internal bleeding  . Niaspan [Niacin Er] Other (See Comments)    Reaction is unknown  . Onglyza [Saxagliptin] Other (See Comments)    Reaction is unknown  . Other     No otc pain medications  . Rofecoxib Other (See Comments)    unknown  . Simvastatin   . Tequin [Gatifloxacin] Other (See Comments)    Reaction is unknown  . Welchol [Colesevelam Hcl]   . Amoxicillin Nausea And Vomiting and Rash    Has patient had a PCN reaction causing immediate rash, facial/tongue/throat swelling, SOB  or lightheadedness with hypotension: Yes Has patient had a PCN reaction causing severe rash involving mucus membranes or skin necrosis: no  Has patient had a PCN reaction that required hospitalization No Has patient had a PCN reaction occurring within the last 10 years: No If all of the above answers are "NO", then may proceed with Cephalosporin use.    VACCINATION STATUS: Immunization History  Administered Date(s) Administered  . Influenza, High Dose Seasonal PF 03/05/2016, 03/14/2017  . Influenza,inj,Quad PF,6+ Mos 02/16/2013, 02/27/2015  . Pneumococcal Conjugate-13 10/11/2014  . Pneumococcal Polysaccharide-23 10/10/2012  . Tdap 10/06/2012    Diabetes  She presents for her follow-up diabetic visit. She has type 2 diabetes mellitus. Onset time: She was diagnosed at approximate age of 29 years . Her disease course has been improving. There are no hypoglycemic associated symptoms. Pertinent negatives for hypoglycemia include no confusion, headaches, pallor or seizures. Associated symptoms include fatigue and visual change. Pertinent negatives for diabetes include no chest pain, no polydipsia and no polyphagia. There are no hypoglycemic complications. Symptoms are improving. Diabetic complications include nephropathy, peripheral neuropathy, PVD and  retinopathy. Risk factors for coronary artery disease include diabetes mellitus, dyslipidemia, hypertension and sedentary lifestyle. Current diabetic treatment includes oral agent (monotherapy). She is compliant with treatment most of the time. Her weight is stable. She is following a generally unhealthy diet. When asked about meal planning, she reported none. She has not had a previous visit with a dietitian. She never participates in exercise. Her home blood glucose trend is decreasing steadily. Her breakfast blood glucose range is generally 130-140 mg/dl. Her dinner blood glucose range is generally 140-180 mg/dl. Her overall blood glucose range is 140-180 mg/dl. An ACE inhibitor/angiotensin II receptor blocker is being taken. Eye exam is current.  Thyroid Problem  Presents for initial visit. Onset time: 15 years. Symptoms include fatigue and visual change. Patient reports no cold intolerance, diarrhea, heat intolerance or palpitations. The symptoms have been stable. Past treatments include levothyroxine. The following procedures have not been performed: thyroidectomy.  Hypertension  This is a chronic problem. The current episode started more than 1 year ago. Pertinent negatives include no chest pain, headaches, palpitations or shortness of breath. Past treatments include angiotensin blockers. Hypertensive end-organ damage includes PVD and retinopathy. Identifiable causes of hypertension include a thyroid problem.    Review of Systems  Constitutional: Positive for fatigue. Negative for unexpected weight change.  HENT: Negative for trouble swallowing and voice change.   Eyes: Negative for visual disturbance.  Respiratory: Negative for cough, shortness of breath and wheezing.   Cardiovascular: Negative for chest pain, palpitations and leg swelling.  Gastrointestinal: Negative for diarrhea, nausea and vomiting.  Endocrine: Negative for cold intolerance, heat intolerance, polydipsia and polyphagia.    Musculoskeletal: Positive for arthralgias, back pain, gait problem, joint swelling and myalgias.          Skin: Negative for color change, pallor, rash and wound.  Neurological: Negative for seizures and headaches.  Psychiatric/Behavioral: Negative for confusion and suicidal ideas.    Objective:    BP 139/73   Pulse 75   Ht 5\' 2"  (1.575 m)   Wt 157 lb (71.2 kg)   BMI 28.72 kg/m   Wt Readings from Last 3 Encounters:  06/29/17 157 lb (71.2 kg)  06/24/17 156 lb 12.8 oz (71.1 kg)  03/24/17 154 lb (69.9 kg)    Physical Exam  Constitutional: She is oriented to person, place, and time. She appears well-developed.  HENT:  Head: Normocephalic and  atraumatic.  Eyes: EOM are normal.  Neck: Normal range of motion. Neck supple. No tracheal deviation present. No thyromegaly present.  Cardiovascular: Normal rate and regular rhythm. Exam reveals decreased pulses.  Pulses:      Dorsalis pedis pulses are 0 on the right side, and 0 on the left side.       Posterior tibial pulses are 0 on the right side, and 0 on the left side.  Pulmonary/Chest: Effort normal and breath sounds normal.  Abdominal: Soft. Bowel sounds are normal. There is no tenderness. There is no guarding.  Musculoskeletal: She exhibits no edema.       Arms: She uses a walker to get around.  Neurological: She is alert and oriented to person, place, and time. She has normal reflexes. A sensory deficit is present. No cranial nerve deficit. Coordination normal.  Skin: Skin is warm and dry. No rash noted. No erythema. No pallor.  Psychiatric: She has a normal mood and affect. Judgment normal.    Results for orders placed or performed in visit on 06/24/17  Microalbumin / creatinine urine ratio  Result Value Ref Range   Creatinine, Urine 54.0 Not Estab. mg/dL   Microalbumin, Urine 25.2 Not Estab. ug/mL   Microalb/Creat Ratio 46.7 (H) 0.0 - 30.0 mg/g creat  Bayer DCA Hb A1c Waived  Result Value Ref Range   Bayer DCA Hb A1c  Waived 6.6 <7.0 %   Diabetic Labs (most recent): Lab Results  Component Value Date   HGBA1C 7.7 12/02/2016   HGBA1C 6.6 (H) 05/05/2016   HGBA1C 7.3 (H) 11/21/2015   Lipid Panel     Component Value Date/Time   CHOL 160 03/14/2017 0824   CHOL 105 09/11/2012 1057   TRIG 170 (H) 03/14/2017 0824   TRIG 170 (H) 10/11/2014 1159   TRIG 79 09/11/2012 1057   HDL 37 (L) 03/14/2017 0824   HDL 53 10/11/2014 1159   HDL 41 09/11/2012 1057   CHOLHDL 4.3 03/14/2017 0824   LDLCALC 89 03/14/2017 0824   LDLCALC 50 02/14/2014 1012   LDLCALC 48 09/11/2012 1057     Assessment & Plan:   1. Type 2 diabetes mellitus with stage 4 chronic kidney disease, without long-term current use of insulin ([place])   - Patient has currently uncontrolled symptomatic type 2 DM since  72 years of age. -She came with significant improvement in her glycemic profile, A1c 6.6%, slowly improving from 10%.  - Her renal function is improving.   -her  diabetes is complicated by PAD, CKD, neuropathy and patient remains at a high risk for more acute and chronic complications of diabetes which include CAD, CVA, CKD, retinopathy, and neuropathy. These are all discussed in detail with the patient.  - I have counseled the patient on diet management  by adopting a carbohydrate restricted/protein rich diet.  -  Suggestion is made for her to avoid simple carbohydrates  from her diet including Cakes, Sweet Desserts / Pastries, Ice Cream, Soda (diet and regular), Sweet Tea, Candies, Chips, Cookies, Store Bought Juices, Alcohol in Excess of  1-2 drinks a day, Artificial Sweeteners, and "Sugar-free" Products. This will help patient to have stable blood glucose profile and potentially avoid unintended weight gain.   - I encouraged the patient to switch to  unprocessed or minimally processed complex starch and increased protein intake (animal or plant source), fruits, and vegetables.  - Patient is advised to stick to a routine mealtimes  to eat 3 meals  a day and avoid  unnecessary snacks ( to snack only to correct hypoglycemia).   - I have approached patient with the following individualized plan to manage diabetes and patient agrees:   - She came with better and safer blood glucose profile .  -Her husband continues to offer to help. -I advised her to continue Tresiba 10 units every morning with breakfast, associated with strict monitoring of blood glucose 2 times a day-before breakfast and daily at bedtime. - First priority in this patient will be to avoid hypoglycemia.  -Patient is encouraged to call clinic for blood glucose levels less than 70 or above 300 mg /dl.  -Patient is not a candidate for MTF, Incretin therapy.  Her renal function is progressively improving.  - Patient specific target  A1c;  LDL, HDL, Triglycerides, and  Waist Circumference were discussed in detail.  2) BP/HTN: Her blood pressure is controlled to target.  Continue current medications including ACEI/ARB. 3) Lipids/HPL:  continue statins. 4)  Weight/Diet: CDE Consult has been initiated, she has limited ability to exercise.  5)  Hypothyroidism:  - Her current thyroid function tests are consistent with appropriate replacement. I advised her to continue levothyroxine 75 g by mouth every morning.  - We discussed about correct intake of levothyroxine, at fasting, with water, separated by at least 30 minutes from breakfast, and separated by more than 4 hours from calcium, iron, multivitamins, acid reflux medications (PPIs). -Patient is made aware of the fact that thyroid hormone replacement is needed for life, dose to be adjusted by periodic monitoring of thyroid function tests.  5) Chronic Care/Health Maintenance:  -Patient is  on ACEI/ARB and Statin medications and encouraged to continue to follow up with Ophthalmology, Podiatrist at least yearly or according to recommendations, and advised to   stay away from smoking. I have recommended yearly flu  vaccine and pneumonia vaccination at least every 5 years;  and  sleep for at least 7 hours a day.  - I advised patient to maintain close follow up with Eustaquio Maize, MD for primary care needs.  - Time spent with the patient: 25 min, of which >50% was spent in reviewing her blood glucose logs , discussing her hypo- and hyper-glycemic episodes, reviewing her current and  previous labs and insulin doses and developing a plan to avoid hypo- and hyper-glycemia. Please refer to Patient Instructions for Blood Glucose Monitoring and Insulin/Medications Dosing Guide"  in media tab for additional information.   Follow up plan: - Return in about 4 months (around 10/27/2017) for follow up with pre-visit labs, meter, and logs.  Glade Lloyd, MD Phone: [phone]  Fax: [phone]  -  This note was partially dictated with voice recognition software. Similar sounding words can be transcribed inadequately or may not  be corrected upon review.  06/29/2017, 11:07 AM

## 2017-07-01 ENCOUNTER — Ambulatory Visit: Payer: Medicare Other | Admitting: Pharmacist

## 2017-07-01 ENCOUNTER — Other Ambulatory Visit: Payer: Self-pay | Admitting: Pharmacist

## 2017-07-01 NOTE — Patient Outreach (Signed)
[name]) Care Management  07/01/2017  [name] 05-31-1945 [id number]   Patient was called to follow up on medication assistance with [place].  HIPAA identifiers were obtained. Patient reported she had her visit with Dr. Dorris Fetch but he did not address her prescription for Tresiba or the pricing.  Dr. Liliane Channel office was called earlier this week and a request was made to investigate writing the prescription with a titration/MDD so that the patient's copay will be less. Dr. Dorris Fetch was routed a note with a message about the patient's copay on 06/28/17.  Dr. Liliane Channel office was called again today. Their office closes at 12:00noon.  A message was left.  I will follow up with provider's office and the patient next week.  [name], PharmD, [place] Clinical Pharmacist [phone]

## 2017-07-04 ENCOUNTER — Telehealth: Payer: Self-pay | Admitting: "Endocrinology

## 2017-07-04 NOTE — Telephone Encounter (Signed)
[facility] pharmacy is calling stating that they need a new Rx for [facility] stating 10 units to max of 30 units so she dont have to pay but 1 copay, please advise?

## 2017-07-05 ENCOUNTER — Ambulatory Visit: Payer: Self-pay

## 2017-07-05 ENCOUNTER — Other Ambulatory Visit: Payer: Self-pay

## 2017-07-05 MED ORDER — ACCU-CHEK SOFTCLIX LANCETS MISC: 5 refills | Status: DC

## 2017-07-05 MED ORDER — INSULIN DEGLUDEC 100 UNIT/ML ~~LOC~~ SOPN: 10.0000 [IU] | PEN_INJECTOR | Freq: Every day | SUBCUTANEOUS | 0 refills | Status: DC

## 2017-07-07 ENCOUNTER — Other Ambulatory Visit: Payer: Self-pay | Admitting: "Endocrinology

## 2017-07-07 ENCOUNTER — Telehealth: Payer: Self-pay

## 2017-07-07 ENCOUNTER — Other Ambulatory Visit: Payer: Self-pay | Admitting: Pharmacist

## 2017-07-07 MED ORDER — INSULIN DEGLUDEC 100 UNIT/ML ~~LOC~~ SOPN: PEN_INJECTOR | SUBCUTANEOUS | 1 refills | Status: DC

## 2017-07-07 MED ORDER — INSULIN DEGLUDEC 100 UNIT/ML ~~LOC~~ SOPN: PEN_INJECTOR | SUBCUTANEOUS | 2 refills | Status: DC

## 2017-07-07 NOTE — Patient Outreach (Signed)
[facility]) Care Management  07/07/2017  MILEA KLINK 1945/09/23 [id number]  Patient was called to follow up on the Tresiba prescription. HIPAA identifiers were obtained. Patient said she had not heard anything from her provider. Patient's chart was reviewed, a new prescription was sent to [facility] instead of [facility]. The Pharmacist at [facility] said a new prescription would be required because the insulin prescriptions are highly audited to be sure the correct days supply was entered.  An inbasket message was sent to Dr. Liliane Channel CMA from a previous note to request a new prescription be sent to [facility] in [place].  Plan: Follow up in 5-7 business days.  [name], PharmD, [place] Clinical Pharmacist [phone]

## 2017-07-07 NOTE — Telephone Encounter (Signed)
Yes, we can send a new prescription to reflect the new instructions.

## 2017-07-07 NOTE — Telephone Encounter (Signed)
-----   Message from [place] sent at 07/07/2017  2:06 PM EST ----- Regarding: FW: [name] Prescription   ----- Message ----- From: [name], [facility] Sent: 07/07/2017   1:35 PM To: Michelene Gardener [place] Subject: [name] Prescription                           Hello. My name is Denyse Amass and I am a Clinical Pharmacist with [facility].  I am reaching out to you about Ms. Atira Pargas's prescription for [place]. I called and left a message and I sent an inbasket to Dr. Dorris Fetch about the patient's copay. (It was documented that [facility] called but the call came from me.)   Patient is taking [place] 10 units daily and that is a 150 day supply---as such, the insurance is charging her 4 copays or $200.  If the prescription is written Inject 10 units daily (dose will be titrated to a maximum daily dose of 30 units) then the patient will not have to pay the elevated copay.   A prescription was sent to [facility] order pharmacy but the patient gets her insulin from [facility] in [place] .  Do you think a new prescription could be sent to [facility] on the patient's behalf instead with these instructions:  Inject 10 units daily (dose will be titrated to a maximum daily dose of 30 units).  Thank you so much for your time and consideration.  Blessings,  [name], PharmD, [place] Clinical Pharmacist [phone]

## 2017-07-08 ENCOUNTER — Other Ambulatory Visit: Payer: Self-pay | Admitting: "Endocrinology

## 2017-07-14 ENCOUNTER — Other Ambulatory Visit: Payer: Self-pay | Admitting: Pharmacist

## 2017-07-14 NOTE — Patient Outreach (Signed)
[facility]) Care Management  07/14/2017  TYESHA JOFFE 04/10/1946 [id number]   Patient's chart was reviewed to see if Dr. Liliane Channel office received my note. Chart review shows they did and sent a corrected prescription for Tresiba into [facility].  [facility] was called on the patient's behalf. The pharmacist confirmed a prescription was sent for the patient and that it was filled but the patient told them to put it back until she needed it.  Patient was called. HIPAA identifiers were obtained. Patient confirmed [facility] called her and told her [name] was ready and would be $93. (Her previous copay was >$200).  Patient confirmed that since she was not completely out, she told them to put it back and will call the pharmacy when she needs a refill.  Plan: Patient's pharmacy case will be closed as her copay was decreased some (she was referred for medication assistance with [place]).  Patient is still active with [facility] Telephonic Nurse, [name].  A message about case closure will be sent to the Telephonic Nurse.  [name], PharmD, [place] Clinical Pharmacist [phone]

## 2017-07-22 ENCOUNTER — Other Ambulatory Visit: Payer: Self-pay | Admitting: "Endocrinology

## 2017-07-28 ENCOUNTER — Other Ambulatory Visit: Payer: Self-pay

## 2017-07-28 MED ORDER — ACCU-CHEK SOFTCLIX LANCETS MISC: 5 refills | Status: DC

## 2017-07-28 MED ORDER — GLUCOSE BLOOD VI STRP: ORAL_STRIP | 5 refills | Status: DC

## 2017-07-29 ENCOUNTER — Other Ambulatory Visit: Payer: Self-pay | Admitting: *Deleted

## 2017-07-29 MED ORDER — GLUCOSE BLOOD VI STRP: ORAL_STRIP | 5 refills | Status: DC

## 2017-07-29 MED ORDER — ACCU-CHEK SOFTCLIX LANCETS MISC: 5 refills | Status: DC

## 2017-08-01 ENCOUNTER — Other Ambulatory Visit: Payer: Self-pay | Admitting: Pediatrics

## 2017-08-01 ENCOUNTER — Encounter: Payer: Self-pay | Admitting: Gastroenterology

## 2017-08-01 DIAGNOSIS — I10 Essential (primary) hypertension: Secondary | ICD-10-CM

## 2017-08-01 DIAGNOSIS — E782 Mixed hyperlipidemia: Secondary | ICD-10-CM

## 2017-08-02 ENCOUNTER — Other Ambulatory Visit: Payer: Self-pay

## 2017-08-02 NOTE — Patient Outreach (Addendum)
[facility]) Care Management  [place]  08/02/2017   KRYSTIN KEEVEN 1945-06-04 [id number]  Subjective:   Successful outreach to the patient for monthly assessment.  HIPAA verified.  The patient is doing well.  She states that she is having some pain in her right knee that she rates a 4/10.  She denies having any falls.  The patient checked her blood sugar this morning and it was 120.  She states that she does not have any lows. She had an appointment with Dr Dorris Fetch on February 6th 2019.  She states that her a1c was 6.7 and Dr Dorris Fetch would like her to be at 7.  She states that she is watching her food intake.  She is adherent with her medications.  She exercises daily by doing stretches.  The patient is scheduled to see her physician in June 2019.  Encounter Medications:  Outpatient Encounter Medications as of 08/02/2017  Medication Sig  . ACCU-CHEK SOFTCLIX LANCETS lancets USE AS INSTRUCTED 4 X DAILY, E11.65  . ACCU-CHEK SOFTCLIX LANCETS lancets Use as instructed 2 x daily  E11.65  . amLODipine (NORVASC) 5 MG tablet TAKE 1 TABLET (5 MG TOTAL) BY MOUTH DAILY.  Marland Kitchen atorvastatin (LIPITOR) 10 MG tablet TAKE 1 TABLET EVERY DAY  . carvedilol (COREG) 6.25 MG tablet Take 1 tablet (6.25 mg total) by mouth 2 (two) times daily with a meal.  . furosemide (LASIX) 40 MG tablet Take 40 mg by mouth.  Marland Kitchen glucose blood (ACCU-CHEK AVIVA PLUS) test strip TEST 2 TIMES DAILY  E11.65  . insulin degludec (TRESIBA FLEXTOUCH) 100 UNIT/ML SOPN FlexTouch Pen Inject 10-30 units daily (dose will be titrated to a maximum daily dose of 30 units).  Marland Kitchen levothyroxine (SYNTHROID, LEVOTHROID) 75 MCG tablet TAKE 1 TABLET EVERY DAY BEFORE BREAKFAST  . TRESIBA FLEXTOUCH 100 UNIT/ML SOPN FlexTouch Pen INJECT 10 UNITS DAILY (DOSE WILL BE TITRATED TO A MAXIMIM DAILY DOSE OF 30 UNITS)  . ULTICARE MICRO PEN NEEDLES 32G X 4 MM MISC   . pantoprazole (PROTONIX) 20 MG tablet Take 1 tablet (20 mg total) by mouth daily.  (Patient not taking: Reported on 06/28/2017)   No facility-administered encounter medications on file as of 08/02/2017.     Functional Status:  In your present state of health, do you have any difficulty performing the following activities: 10/22/2016  Hearing? N  Vision? N  Difficulty concentrating or making decisions? N  Walking or climbing stairs? Y  Dressing or bathing? Y  Some recent data might be hidden    Fall/Depression Screening: Fall Risk  08/02/2017 06/28/2017 06/24/2017  Falls in the past year? No No No  Number falls in past yr: - - -  Injury with Fall? - - -  Comment - - -  Risk Factor Category  - - -  Risk for fall due to : - - -  Follow up - - -   PHQ 2/9 Scores 06/28/2017 06/24/2017 06/24/2017 03/14/2017 12/02/2016 09/20/2016 09/03/2016  PHQ - 2 Score 0 0 0 0 0 0 0  PHQ- 9 Score - - - - - - -    Assessment: Patient continues to benefit from health coach outreach for disease management and support.    THN CM Care Plan Problem One     Most Recent Value  THN Long Term Goal   In 90 days the patient will verbalized that she has increased her a1c to 7.0  (Pended)   THN Long Term Goal Start  Date  08/02/17  (Pended)   Interventions for Problem One Long Term Goal  [facility] spoke with the patient and she states that her physician wants her a1c at 7.  We dicsussed her diet and foods she needs to stay awayfrom and some toeat in moderation.  We also dicussed her exercise.  (Pended)   THN CM Short Term Goal #1   In 30 days the patient will verbalize two foods to include in her diabetic diet  (Pended)      Plan: [facility] will contact patient in the month of April and patient agrees to next outreach.  [name] RN, BSN, [place] Direct Dial:  [phone] Fax: [phone]

## 2017-08-16 ENCOUNTER — Other Ambulatory Visit: Payer: Self-pay | Admitting: "Endocrinology

## 2017-08-24 ENCOUNTER — Other Ambulatory Visit: Payer: Self-pay | Admitting: Pediatrics

## 2017-08-24 DIAGNOSIS — E034 Atrophy of thyroid (acquired): Secondary | ICD-10-CM

## 2017-08-24 DIAGNOSIS — K219 Gastro-esophageal reflux disease without esophagitis: Secondary | ICD-10-CM

## 2017-08-29 ENCOUNTER — Other Ambulatory Visit: Payer: Self-pay | Admitting: Pediatrics

## 2017-08-29 DIAGNOSIS — I10 Essential (primary) hypertension: Secondary | ICD-10-CM

## 2017-09-02 ENCOUNTER — Other Ambulatory Visit: Payer: Self-pay

## 2017-09-02 NOTE — Patient Outreach (Signed)
[facility]) Care Management  [place]  09/02/2017   [name] Oct 23, 1945 [id number]  Subjective: Telephone call to the patient for monthly assessment. HIPAA verified.  The patient states that she is doing well.  She denies any pain or falls.  She states that she is monitoring her food intake and eating the correct foods.  She is exercising by dong stretches.  The patient states that he blood sugars this morning was 113.   The patient states that she is adherent with her medications.  She was able to get her insulin and was appreciative for the help [facility] has given her.  She has an appointment with Dr. Dorris Fetch in June.  She has a colonoscopy Oct 12, 2017.   Encounter Medications:  Outpatient Encounter Medications as of 09/02/2017  Medication Sig  . ACCU-CHEK SOFTCLIX LANCETS lancets USE AS INSTRUCTED 4 X DAILY, E11.65  . ACCU-CHEK SOFTCLIX LANCETS lancets Use as instructed 2 x daily  E11.65  . amLODipine (NORVASC) 5 MG tablet TAKE 1 TABLET (5 MG TOTAL) BY MOUTH DAILY.  Marland Kitchen atorvastatin (LIPITOR) 10 MG tablet TAKE 1 TABLET EVERY DAY  . carvedilol (COREG) 6.25 MG tablet TAKE 1 TABLET (6.25 MG TOTAL) BY MOUTH 2 (TWO) TIMES DAILY WITH A MEAL.  . furosemide (LASIX) 40 MG tablet Take 40 mg by mouth.  Marland Kitchen glucose blood (ACCU-CHEK AVIVA PLUS) test strip TEST 2 TIMES DAILY  E11.65  . levothyroxine (SYNTHROID, LEVOTHROID) 75 MCG tablet TAKE 1 TABLET EVERY DAY BEFORE BREAKFAST  . pantoprazole (PROTONIX) 20 MG tablet TAKE 1 TABLET EVERY DAY  . TRESIBA FLEXTOUCH 100 UNIT/ML SOPN FlexTouch Pen INJECT 10 UNITS DAILY (DOSE WILL BE TITRATED TO A MAX OF 30 UNITS DAILY)  . ULTICARE MICRO PEN NEEDLES 32G X 4 MM MISC   . insulin degludec (TRESIBA FLEXTOUCH) 100 UNIT/ML SOPN FlexTouch Pen Inject 10-30 units daily (dose will be titrated to a maximum daily dose of 30 units). (Patient not taking: Reported on 09/02/2017)   No facility-administered encounter medications on file as of 09/02/2017.      Functional Status:  In your present state of health, do you have any difficulty performing the following activities: 10/22/2016  Hearing? N  Vision? N  Difficulty concentrating or making decisions? N  Walking or climbing stairs? Y  Dressing or bathing? Y  Some recent data might be hidden    Fall/Depression Screening: Fall Risk  09/02/2017 08/02/2017 06/28/2017  Falls in the past year? No No No  Number falls in past yr: - - -  Injury with Fall? - - -  Comment - - -  Risk Factor Category  - - -  Risk for fall due to : - - -  Follow up - - -   PHQ 2/9 Scores 06/28/2017 06/24/2017 06/24/2017 03/14/2017 12/02/2016 09/20/2016 09/03/2016  PHQ - 2 Score 0 0 0 0 0 0 0  PHQ- 9 Score - - - - - - -    Assessment: Patient continues to benefit from health coach outreach for disease management and support.    THN CM Care Plan Problem One     Most Recent Value  THN Long Term Goal   In 90 days the patient will verbalized that she has mantained her a1c level of 6.9  THN Long Term Goal Start Date  09/02/17  Interventions for Problem One Long Term Goal  [facility] talked with the patient about diet and medications  THN CM Short Term Goal #1  In 30 days the patient will verbalize two foods to include in her diabetic diet  THN CM Short Term Goal #1 Start Date  09/02/17  [facility] CM Short Term Goal #1 Met Date  09/02/17  Interventions for Short Term Goal #1  Patient states that she is eating foods correct for her diabetes  THN CM Short Term Goal #2   In the next 30 days the patient will verbalize that she has not had any lows  THN CM Short Term Goal #2 Start Date  09/02/17  [facility] CM Short Term Goal #2 Met Date  09/02/17  Interventions for Short Term Goal #2  [facility] talked with the patient about her blood sugars and she states no lows     Plan: [place] [facility] will contact patient in the month of May and patient agrees to next outreach.  [name] RN, BSN, [place] Direct Dial:  [phone]  Fax: [phone]

## 2017-09-06 DIAGNOSIS — M79676 Pain in unspecified toe(s): Secondary | ICD-10-CM | POA: Diagnosis not present

## 2017-09-06 DIAGNOSIS — L84 Corns and callosities: Secondary | ICD-10-CM | POA: Diagnosis not present

## 2017-09-06 DIAGNOSIS — E1151 Type 2 diabetes mellitus with diabetic peripheral angiopathy without gangrene: Secondary | ICD-10-CM | POA: Diagnosis not present

## 2017-09-06 DIAGNOSIS — B351 Tinea unguium: Secondary | ICD-10-CM | POA: Diagnosis not present

## 2017-09-28 ENCOUNTER — Ambulatory Visit (AMBULATORY_SURGERY_CENTER): Payer: Self-pay | Admitting: *Deleted

## 2017-09-28 ENCOUNTER — Other Ambulatory Visit: Payer: Self-pay

## 2017-09-28 VITALS — Ht 62.0 in | Wt 160.0 lb

## 2017-09-28 DIAGNOSIS — Z1211 Encounter for screening for malignant neoplasm of colon: Secondary | ICD-10-CM

## 2017-09-28 DIAGNOSIS — Z800 Family history of malignant neoplasm of digestive organs: Secondary | ICD-10-CM

## 2017-09-28 NOTE — Progress Notes (Signed)
Patient denies any allergies to eggs or soy. Patient denies any problems with anesthesia/sedation. Patient denies any oxygen use at home. Patient denies taking any diet/weight loss medications or blood thinners. EMMI education declined by pt. No email per pt.

## 2017-10-03 ENCOUNTER — Telehealth: Payer: Self-pay | Admitting: Gastroenterology

## 2017-10-03 NOTE — Telephone Encounter (Signed)
Pt's husband Ed has questions regarding prep. Pls call him.

## 2017-10-03 NOTE — Telephone Encounter (Signed)
Patient's husband wants to know if he can mix miralax with clear Powerade because he states it has sugar in it and Gatorade does not.. Told patient that it should be okay just make sure it's 64 oz.

## 2017-10-04 ENCOUNTER — Other Ambulatory Visit: Payer: Self-pay

## 2017-10-04 NOTE — Patient Outreach (Signed)
[name]) Care Management  10/04/2017  [name] 06-23-1945 [id number]  [name] is being followed by [facility] for Health Coaching for DM. Our pharmacy team has been involved in her care in addition to nursing.   I spoke with [name] by phone today. She reports feeling "very well". She is checking her cbg daily and says it was 113 this morning. She related to me that she stopped snacking, at the advice of her endocrinologist, Dr. Dorris Fetch and began eating 3 meals a day and adding routine exercise, even if only walking around in her house, and attributes her success in diabetes management to the good care and advice she has received. [name] is able to tell me that her most recent HgA1C = 6.3 and she is scheduled to see her primary care provider on Oct 20, 2017 at which time she will have labs drawn again, incluing HgA1C. [name] also relates that she is scheduled for her colonoscopy on 10/12/17. [name] made a point to express her gratitude to our pharmacy team for assistance with lowering the cost of her insulin. She says she will be "forever grateful."   [name] agreed to another phone outreach by our [employer] team next month as she is particularly interested to review and discuss her upcoming HgA1C results.   Plan: We will reach out to [name] in June for continued follow up and health coaching.    THN CM Care Plan Problem One     Most Recent Value  Care Plan Problem One  Knowledge deficit of diabetes management  Role Documenting the Problem One  Health Coach  Care Plan for Problem One  Active  THN Long Term Goal   In 90 days the patient will verbalized that she has mantained her a1c level of 6.9  THN Long Term Goal Start Date  09/02/17  Interventions for Problem One Long Term Goal  reviewed HgA1C fidnings and goals  THN CM Short Term Goal #1   Over the next 30 days, patient will attend all scheduled provider  appointments  [facility] CM Short Term Goal #1 Start Date  10/04/17  Interventions for Short Term Goal #1  reviewed provider appointment schedule     [name]  [phone]

## 2017-10-05 ENCOUNTER — Other Ambulatory Visit: Payer: Self-pay | Admitting: Pediatrics

## 2017-10-05 DIAGNOSIS — E782 Mixed hyperlipidemia: Secondary | ICD-10-CM

## 2017-10-05 NOTE — Telephone Encounter (Signed)
OV 10/20/17

## 2017-10-12 ENCOUNTER — Encounter: Payer: Medicare Other | Admitting: Internal Medicine

## 2017-10-12 ENCOUNTER — Ambulatory Visit (AMBULATORY_SURGERY_CENTER): Payer: Medicare Other | Admitting: Gastroenterology

## 2017-10-12 ENCOUNTER — Other Ambulatory Visit: Payer: Self-pay

## 2017-10-12 ENCOUNTER — Encounter: Payer: Self-pay | Admitting: Gastroenterology

## 2017-10-12 VITALS — BP 141/82 | HR 60 | Temp 98.4°F | Resp 16 | Ht 62.0 in | Wt 160.0 lb

## 2017-10-12 DIAGNOSIS — D125 Benign neoplasm of sigmoid colon: Secondary | ICD-10-CM | POA: Diagnosis not present

## 2017-10-12 DIAGNOSIS — Z1211 Encounter for screening for malignant neoplasm of colon: Secondary | ICD-10-CM | POA: Diagnosis not present

## 2017-10-12 DIAGNOSIS — D120 Benign neoplasm of cecum: Secondary | ICD-10-CM

## 2017-10-12 DIAGNOSIS — K635 Polyp of colon: Secondary | ICD-10-CM

## 2017-10-12 DIAGNOSIS — D122 Benign neoplasm of ascending colon: Secondary | ICD-10-CM | POA: Diagnosis not present

## 2017-10-12 DIAGNOSIS — D123 Benign neoplasm of transverse colon: Secondary | ICD-10-CM

## 2017-10-12 MED ORDER — SODIUM CHLORIDE 0.9 % IV SOLN: 500.0000 mL | Freq: Once | INTRAVENOUS | Status: DC

## 2017-10-12 NOTE — Progress Notes (Signed)
Called to room to assist during endoscopic procedure.  Patient ID and intended procedure confirmed with present staff. Received instructions for my participation in the procedure from the performing physician.  

## 2017-10-12 NOTE — Op Note (Signed)
[name] Procedure Date: 10/12/2017 9:53 AM MRN: [id number] Endoscopist: Remo Lipps P. Havery Moros , MD Age: 72 Referring MD:  Date of Birth: 09/10/1945 Gender: Female Account #: [id number] Procedure:                Colonoscopy Indications:              Screening for colorectal malignant neoplasm Medicines:                Monitored Anesthesia Care Procedure:                Pre-Anesthesia Assessment:                           - Prior to the procedure, a History and Physical                            was performed, and patient medications and                            allergies were reviewed. The patient's tolerance of                            previous anesthesia was also reviewed. The risks                            and benefits of the procedure and the sedation                            options and risks were discussed with the patient.                            All questions were answered, and informed consent                            was obtained. Prior Anticoagulants: The patient has                            taken no previous anticoagulant or antiplatelet                            agents. ASA Grade Assessment: II - A patient with                            mild systemic disease. After reviewing the risks                            and benefits, the patient was deemed in                            satisfactory condition to undergo the procedure.                           After obtaining informed consent, the colonoscope  was passed under direct vision. Throughout the                            procedure, the patient's blood pressure, pulse, and                            oxygen saturations were monitored continuously. The                            Colonoscope was introduced through the anus and                            advanced to the the cecum, identified by                            appendiceal orifice  and ileocecal valve. The                            colonoscopy was technically difficult and complex                            due to a tortuous colon. The patient tolerated the                            procedure well. The quality of the bowel                            preparation was adequate. The ileocecal valve,                            appendiceal orifice, and rectum were photographed. Scope In: 10:00:45 AM Scope Out: 10:44:39 AM Scope Withdrawal Time: 0 hours 33 minutes 28 seconds  Total Procedure Duration: 0 hours 43 minutes 54 seconds  Findings:                 The perianal and digital rectal examinations were                            normal.                           A 5 mm polyp was found in the cecum. The polyp was                            sessile. The polyp was removed with a cold snare.                            Resection and retrieval were complete.                           Two sessile polyps were found in the ascending                            colon. The polyps were 3 to 7 mm  in size. These                            polyps were removed with a cold snare. Resection                            and retrieval were complete.                           Five sessile polyps were found in the transverse                            colon. The polyps were 3 to 7 mm in size. These                            polyps were removed with a cold snare. Resection                            and retrieval were complete.                           A 10 mm polyp was found in the splenic flexure. The                            polyp was semi-pedunculated. The polyp was located                            in an area which was technically challenging to                            grasp the polyp given a loop persistently formed in                            this area. It was eventually able to be removed                            with a hot snare. Resection and retrieval were                             complete.                           Four sessile polyps were found in the sigmoid                            colon. The polyps were 4 to 5 mm in size. These                            polyps were removed with a cold snare. Resection                            and retrieval were complete.  Many medium-mouthed diverticula were found in the                            left colon.                           Internal hemorrhoids were found during retroflexion.                           The left colon did not retain air well which                            prolonged the exam. The exam was otherwise without                            abnormality. Complications:            No immediate complications. Estimated blood loss:                            Minimal. Estimated Blood Loss:     Estimated blood loss was minimal. Impression:               - One 5 mm polyp in the cecum, removed with a cold                            snare. Resected and retrieved.                           - Two 3 to 7 mm polyps in the ascending colon,                            removed with a cold snare. Resected and retrieved.                           - Five 3 to 7 mm polyps in the transverse colon,                            removed with a cold snare. Resected and retrieved.                           - One 10 mm polyp at the splenic flexure, removed                            with a hot snare. Resected and retrieved.                           - Four 4 to 5 mm polyps in the sigmoid colon,                            removed with a cold snare. Resected and retrieved.                           - Diverticulosis in the left colon.                           -  Internal hemorrhoids.                           - The examination was otherwise normal. Recommendation:           - Patient has a contact number available for                            emergencies. The signs and symptoms of potential                             delayed complications were discussed with the                            patient. Return to normal activities tomorrow.                            Written discharge instructions were provided to the                            patient.                           - Resume previous diet.                           - Continue present medications.                           - Await pathology results.                           - Repeat colonoscopy for surveillance based on                            pathology results.                           - No ibuprofen, naproxen, or other non-steroidal                            anti-inflammatory drugs for 2 weeks after polyp                            removal. Remo Lipps P. Kalum Minner, MD 10/12/2017 10:52:05 AM This report has been signed electronically.

## 2017-10-12 NOTE — Progress Notes (Signed)
A/ox3, pleased with MAC, report to RN 

## 2017-10-12 NOTE — Patient Instructions (Signed)
*  Your doctor's name today was Omaha Cellar MD. Absolutely no NSAIDS, ibuprofen, Aleve or aspirin for 2 weeks. *Handouts given on polyps and diverticulosis.   YOU HAD AN ENDOSCOPIC PROCEDURE TODAY AT [facility]:   Refer to the procedure report that was given to you for any specific questions about what was found during the examination.  If the procedure report does not answer your questions, please call your gastroenterologist to clarify.  If you requested that your care partner not be given the details of your procedure findings, then the procedure report has been included in a sealed envelope for you to review at your convenience later.  YOU SHOULD EXPECT: Some feelings of bloating in the abdomen. Passage of more gas than usual.  Walking can help get rid of the air that was put into your GI tract during the procedure and reduce the bloating. If you had a lower endoscopy (such as a colonoscopy or flexible sigmoidoscopy) you may notice spotting of blood in your stool or on the toilet paper. If you underwent a bowel prep for your procedure, you may not have a normal bowel movement for a few days.  Please Note:  You might notice some irritation and congestion in your nose or some drainage.  This is from the oxygen used during your procedure.  There is no need for concern and it should clear up in a day or so.  SYMPTOMS TO REPORT IMMEDIATELY:   Following lower endoscopy (colonoscopy or flexible sigmoidoscopy):  Excessive amounts of blood in the stool  Significant tenderness or worsening of abdominal pains  Swelling of the abdomen that is new, acute  Fever of 100F or higher   For urgent or emergent issues, a gastroenterologist can be reached at any hour by calling [phone].   DIET:  We do recommend a small meal at first, but then you may proceed to your regular diet.  Drink plenty of fluids but you should avoid alcoholic beverages for 24 hours.  ACTIVITY:  You  should plan to take it easy for the rest of today and you should NOT DRIVE or use heavy machinery until tomorrow (because of the sedation medicines used during the test).    FOLLOW UP: Our staff will call the number listed on your records the next business day following your procedure to check on you and address any questions or concerns that you may have regarding the information given to you following your procedure. If we do not reach you, we will leave a message.  However, if you are feeling well and you are not experiencing any problems, there is no need to return our call.  We will assume that you have returned to your regular daily activities without incident.  If any biopsies were taken you will be contacted by phone or by letter within the next 1-3 weeks.  Please call us at [phone] if you have not heard about the biopsies in 3 weeks.    SIGNATURES/CONFIDENTIALITY: You and/or your care partner have signed paperwork which will be entered into your electronic medical record.  These signatures attest to the fact that that the information above on your After Visit Summary has been reviewed and is understood.  Full responsibility of the confidentiality of this discharge information lies with you and/or your care-partner.

## 2017-10-13 ENCOUNTER — Telehealth: Payer: Self-pay | Admitting: *Deleted

## 2017-10-13 NOTE — Telephone Encounter (Signed)
  Follow up Call-  Call back number 10/12/2017  Post procedure Call Back phone  # [phone]  Permission to leave phone message Yes  Some recent data might be hidden     Patient questions:  Do you have a fever, pain , or abdominal swelling? No. Pain Score  0 *  Have you tolerated food without any problems? Yes.    Have you been able to return to your normal activities? Yes.    Do you have any questions about your discharge instructions: Diet   no Medications  No. Follow up visit  No.    Do you have questions or concerns about your Care? No.  Actions: * If pain score is 4 or above: No action needed, pain <4.

## 2017-10-20 ENCOUNTER — Encounter: Payer: Self-pay | Admitting: Pediatrics

## 2017-10-20 ENCOUNTER — Ambulatory Visit (INDEPENDENT_AMBULATORY_CARE_PROVIDER_SITE_OTHER): Payer: Medicare Other | Admitting: Pediatrics

## 2017-10-20 VITALS — BP 139/75 | HR 61 | Temp 97.4°F | Ht 62.0 in | Wt 159.2 lb

## 2017-10-20 DIAGNOSIS — M810 Age-related osteoporosis without current pathological fracture: Secondary | ICD-10-CM | POA: Diagnosis not present

## 2017-10-20 DIAGNOSIS — Z794 Long term (current) use of insulin: Secondary | ICD-10-CM | POA: Diagnosis not present

## 2017-10-20 DIAGNOSIS — N184 Chronic kidney disease, stage 4 (severe): Secondary | ICD-10-CM

## 2017-10-20 DIAGNOSIS — J3089 Other allergic rhinitis: Secondary | ICD-10-CM

## 2017-10-20 DIAGNOSIS — E1122 Type 2 diabetes mellitus with diabetic chronic kidney disease: Secondary | ICD-10-CM | POA: Diagnosis not present

## 2017-10-20 DIAGNOSIS — I10 Essential (primary) hypertension: Secondary | ICD-10-CM | POA: Diagnosis not present

## 2017-10-20 LAB — HEMOGLOBIN A1C: Hemoglobin A1C: 6.8

## 2017-10-20 LAB — BAYER DCA HB A1C WAIVED: HB A1C: 6.8 % (ref <7.0)

## 2017-10-20 MED ORDER — CETIRIZINE HCL 5 MG PO TABS: 5.0000 mg | ORAL_TABLET | Freq: Every day | ORAL | 1 refills | Status: DC

## 2017-10-20 MED ORDER — FLUTICASONE PROPIONATE 50 MCG/ACT NA SUSP: 2.0000 | Freq: Every day | NASAL | 6 refills | Status: DC

## 2017-10-20 MED ORDER — CARVEDILOL 6.25 MG PO TABS: 6.2500 mg | ORAL_TABLET | Freq: Two times a day (BID) | ORAL | 1 refills | Status: DC

## 2017-10-20 MED ORDER — AMLODIPINE BESYLATE 5 MG PO TABS: 5.0000 mg | ORAL_TABLET | Freq: Every day | ORAL | 1 refills | Status: DC

## 2017-10-20 NOTE — Progress Notes (Signed)
  Subjective:   Patient ID: [name], female    DOB: 1946/02/23, 72 y.o.   MRN: [id number] CC: Medical Management of Chronic Issues  HPI: [name] is a 72 y.o. female   DM2: AM BGL this morning 126. Lowest numbers in the morning, down to 89 or 90. Usually around 100.  Insulin now more affordable, on Tresiba.  Following with endocrinology.  Lots of stress still. Family members ill.   Osteoporosis: h/o fragility fracture in 2015 had hip fracture. With CKD. Not currently on treatment.  HLD: taking statin regularly  GERD: taking pantoprazole daily, has symptoms when she misses  Gout: on uloric, following with rheumatology. No recent flares.  Ears have been itching and bothering her, feeling full.   Relevant past medical, surgical, family and social history reviewed. Allergies and medications reviewed and updated. Social History   Tobacco Use  Smoking Status Never Smoker  Smokeless Tobacco Never Used   ROS: Per HPI   Objective:    BP 139/75   Pulse 61   Temp (!) 97.4 F (36.3 C) (Oral)   Ht _0  (1.575 m)   Wt 159 lb 3.2 oz (72.2 kg)   BMI 29.12 kg/m   Wt Readings from Last 3 Encounters:  10/20/17 159 lb 3.2 oz (72.2 kg)  10/12/17 160 lb (72.6 kg)  09/28/17 160 lb (72.6 kg)    Gen: NAD, alert, cooperative with exam, NCAT EYES: EOMI, no conjunctival injection, or no icterus ENT:  R TM retracted, L TM dull with white effusion. OP without erythema LYMPH: no cervical LAD CV: NRRR, normal S1/S2 Resp: CTABL, no wheezes, normal WOB Abd: +BS, soft, NTND.  Ext: No edema, warm Neuro: Alert and oriented, strength equal b/l UE and LE, coordination grossly normal MSK: normal muscle bulk  Assessment & Plan:  [name] was seen today for medical management of chronic issues.  Diagnoses and all orders for this visit:  Type 2 diabetes mellitus with stage 4 chronic kidney disease, with long-term current use of insulin ([place]) Foot exam completed today.  Follows with Dr.  Irving Shows for foot care.  No recent low blood sugars.  Has upcoming appointment with Dr. Dorien Chihuahua. -     [facility] Hb A1c Waived  Essential hypertension -     amLODipine (NORVASC) 5 MG tablet; Take 1 tablet (5 mg total) by mouth daily. -     carvedilol (COREG) 6.25 MG tablet; Take 1 tablet (6.25 mg total) by mouth 2 (two) times daily with a meal. -     BMP8+EGFR  Osteoporosis without current pathological fracture, unspecified osteoporosis type History of hip fracture 6 years ago.  Not currently on any treatment.  Will get repeat DEXA.  Last was in 2015, not able to view results.  -     VITAMIN D 25 Hydroxy (Vit-D Deficiency, Fractures) -     DG [facility] DEXA  Allergic rhinitis due to other allergic trigger, unspecified seasonality Ongoing symptoms, okay to take below as needed.  -     cetirizine (ZYRTEC) 5 MG tablet; Take 1 tablet (5 mg total) by mouth daily. -     fluticasone (FLONASE) 50 MCG/ACT nasal spray; Place 2 sprays into both nostrils daily.   Follow up plan: Return in about 3 months (around 01/20/2018). Assunta Found, MD [place]

## 2017-10-20 NOTE — Patient Instructions (Addendum)
I would like you to try to take 800 IU of vitamin D every day and 1200 mg of calcium to help keep your bones strong but you do not need a prescription medicine for your bones right now.   Try to get 20-30 minutes of weight bearing exercise (walking is fine) on average a day. This will help keep your bones strong.  Schedule for bone density.

## 2017-10-21 ENCOUNTER — Telehealth: Payer: Self-pay | Admitting: Pediatrics

## 2017-10-21 ENCOUNTER — Encounter: Payer: Self-pay | Admitting: Gastroenterology

## 2017-10-21 ENCOUNTER — Other Ambulatory Visit: Payer: Self-pay | Admitting: Pediatrics

## 2017-10-21 ENCOUNTER — Encounter: Payer: Self-pay | Admitting: *Deleted

## 2017-10-21 DIAGNOSIS — E559 Vitamin D deficiency, unspecified: Secondary | ICD-10-CM

## 2017-10-21 LAB — BMP8+EGFR
BUN / CREAT RATIO: 21 (ref 12–28)
BUN: 29 mg/dL — AB (ref 8–27)
CHLORIDE: 108 mmol/L — AB (ref 96–106)
CO2: 21 mmol/L (ref 20–29)
Calcium: 9.1 mg/dL (ref 8.7–10.3)
Creatinine, Ser: 1.39 mg/dL — ABNORMAL HIGH (ref 0.57–1.00)
GFR calc non Af Amer: 38 mL/min/{1.73_m2} — ABNORMAL LOW (ref >59)
GFR, EST AFRICAN AMERICAN: 44 mL/min/{1.73_m2} — AB (ref >59)
GLUCOSE: 172 mg/dL — AB (ref 65–99)
POTASSIUM: 4.5 mmol/L (ref 3.5–5.2)
Sodium: 143 mmol/L (ref 134–144)

## 2017-10-21 LAB — VITAMIN D 25 HYDROXY (VIT D DEFICIENCY, FRACTURES): Vit D, 25-Hydroxy: 13.8 ng/mL — ABNORMAL LOW (ref 30.0–100.0)

## 2017-10-21 MED ORDER — VITAMIN D (ERGOCALCIFEROL) 1.25 MG (50000 UNIT) PO CAPS: 50000.0000 [IU] | ORAL_CAPSULE | ORAL | 0 refills | Status: DC

## 2017-10-21 NOTE — Telephone Encounter (Signed)
Pt notified to take Vit D 2000iu daily  Verbalizes understanding

## 2017-10-27 ENCOUNTER — Ambulatory Visit (INDEPENDENT_AMBULATORY_CARE_PROVIDER_SITE_OTHER): Payer: Medicare Other

## 2017-10-27 ENCOUNTER — Ambulatory Visit: Payer: Medicare Other | Admitting: "Endocrinology

## 2017-10-27 DIAGNOSIS — M810 Age-related osteoporosis without current pathological fracture: Secondary | ICD-10-CM | POA: Diagnosis not present

## 2017-11-02 ENCOUNTER — Encounter: Payer: Self-pay | Admitting: *Deleted

## 2017-11-07 DIAGNOSIS — E1122 Type 2 diabetes mellitus with diabetic chronic kidney disease: Secondary | ICD-10-CM | POA: Diagnosis not present

## 2017-11-07 DIAGNOSIS — N2581 Secondary hyperparathyroidism of renal origin: Secondary | ICD-10-CM | POA: Diagnosis not present

## 2017-11-07 DIAGNOSIS — M109 Gout, unspecified: Secondary | ICD-10-CM | POA: Diagnosis not present

## 2017-11-07 DIAGNOSIS — R809 Proteinuria, unspecified: Secondary | ICD-10-CM | POA: Diagnosis not present

## 2017-11-07 DIAGNOSIS — I129 Hypertensive chronic kidney disease with stage 1 through stage 4 chronic kidney disease, or unspecified chronic kidney disease: Secondary | ICD-10-CM | POA: Diagnosis not present

## 2017-11-07 DIAGNOSIS — N184 Chronic kidney disease, stage 4 (severe): Secondary | ICD-10-CM | POA: Diagnosis not present

## 2017-11-07 DIAGNOSIS — D631 Anemia in chronic kidney disease: Secondary | ICD-10-CM | POA: Diagnosis not present

## 2017-11-14 ENCOUNTER — Telehealth: Payer: Self-pay | Admitting: "Endocrinology

## 2017-11-14 MED ORDER — ACCU-CHEK SOFTCLIX LANCETS MISC: 5 refills | Status: DC

## 2017-11-14 NOTE — Telephone Encounter (Signed)
Rx sent 

## 2017-11-14 NOTE — Telephone Encounter (Signed)
[facility] is calling in regards to [facility], they are asking for a new Rx stating testing 2 x's a day due to insurance purposes, please advise?

## 2017-11-15 ENCOUNTER — Encounter: Payer: Self-pay | Admitting: "Endocrinology

## 2017-11-15 ENCOUNTER — Ambulatory Visit (INDEPENDENT_AMBULATORY_CARE_PROVIDER_SITE_OTHER): Payer: Medicare Other | Admitting: "Endocrinology

## 2017-11-15 VITALS — BP 135/68 | HR 70 | Ht 62.0 in | Wt 159.0 lb

## 2017-11-15 DIAGNOSIS — E559 Vitamin D deficiency, unspecified: Secondary | ICD-10-CM

## 2017-11-15 DIAGNOSIS — N183 Chronic kidney disease, stage 3 unspecified: Secondary | ICD-10-CM

## 2017-11-15 DIAGNOSIS — E1122 Type 2 diabetes mellitus with diabetic chronic kidney disease: Secondary | ICD-10-CM

## 2017-11-15 DIAGNOSIS — Z794 Long term (current) use of insulin: Secondary | ICD-10-CM

## 2017-11-15 DIAGNOSIS — E782 Mixed hyperlipidemia: Secondary | ICD-10-CM

## 2017-11-15 DIAGNOSIS — E039 Hypothyroidism, unspecified: Secondary | ICD-10-CM

## 2017-11-15 DIAGNOSIS — I10 Essential (primary) hypertension: Secondary | ICD-10-CM

## 2017-11-15 MED ORDER — ACCU-CHEK SOFTCLIX LANCETS MISC: 5 refills | Status: DC

## 2017-11-15 NOTE — Progress Notes (Signed)
Subjective:    Patient ID: [name], female    DOB: 1945/11/09. Patient is to follow-up for uncontrolled type 2 diabetes with her meter and logs.  Past Medical History:  Diagnosis Date  . Anal fistula   . Anemia in chronic renal disease    Aranesp injection --  when Hg <11, last injection 12-24-14.  Marland Kitchen Anxiety   . Arthritis    knees and hand/fingers. "broke back"-being evaluated for this"weakness left leg"  . Cataract    both eyes done  . 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 CKD (chronic kidney disease), stage III [facility])    nephrologist--  dr Mercy Moore-- [place]  07-09-2016  . Complication of anesthesia    post-op confusion   . Constipation   . Coronary artery disease   . Diabetic gastroparesis ([place])   . Diabetic retinopathy ([place])    bilateral --  monitored by dr [name]  . Diverticulosis of colon   . GAD (generalized anxiety disorder)   . GERD (gastroesophageal reflux disease)   . History of colon polyps    benign  . History of esophagitis   . History of GI bleed    upper 2009  due to esophagitis  &  2002  due to Mallory-Weiss tear  . History of hyperkalemia    pt had previously been canceled twice dos due to elevated K+, 07-29-2016 last date cancelled -- pt visited pcp same day treated w/ kayexelate and K+ came down, pt brought all her medication's in to pcp office and found pt was taking losartan that had been discontinued due to ckd, pcp stated this was cause of elevated K+  . 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 History of rectal abscess    12-29-2004  bedside I & D  . Hyperlipidemia   . Hypertension   . Hypothyroidism   . LAFB (left anterior fascicular block)   . Right bundle branch block   . Sacral decubitus ulcer    since 2014- 01-02-15 remains with wound" gauze dressing changes daily.  . Type II diabetes mellitus ([place])    Past Surgical History:  Procedure Laterality Date  . APPLICATION OF A-CELL OF  EXTREMITY N/A 04/07/2015   Procedure: A CELL PLACMENT;  Surgeon: Loel Lofty Dillingham, DO;  Location: [place];  Service: Plastics;  Laterality: N/A;  . CARDIOVASCULAR STRESS TEST  12-30-2004   normal perfusion study/  no ischemia or infartion/  normal LV wall function and wall motion , ef 66%  . CATARACT EXTRACTION W/ INTRAOCULAR LENS  IMPLANT, BILATERAL  1995  . COLONOSCOPY  2008   w/Dr.Brodie   . COLONOSCOPY W/ POLYPECTOMY  last one 2008  . COMPRESSION HIP SCREW Right 05/18/2014   Procedure: COMPRESSION HIP;  Surgeon: Carole Civil, MD;  Location: [facility];  Service: Orthopedics;  Laterality: Right;  . ESOPHAGOGASTRODUODENOSCOPY  last one 01-09-2011  . EVALUATION UNDER ANESTHESIA WITH FISTULECTOMY N/A 01/06/2015   Procedure: EXAM UNDER ANESTHESIA , placement of seton;  Surgeon: Jackolyn Confer, MD;  Location: [facility];  Service: General;  Laterality: N/A;  . I&D EXTREMITY N/A 04/07/2015   Procedure: IRRIGATION AND DEBRIDEMENT ISCHIAL ULCER;  Surgeon: Loel Lofty Dillingham, DO;  Location: [place];  Service: Plastics;  Laterality: N/A;  . INCISION AND DRAINAGE OF WOUND N/A 09/30/2014   Procedure: IRRIGATION AND DEBRIDEMENT SACRAL WOUND, EXCISION OF PERIRECTAL TRACT WITH PLACEMENT OF ACCELL;  Surgeon: Theodoro Kos,  DO;  Location: [place];  Service: Plastics;  Laterality: N/A;  . LIGATION OF INTERNAL FISTULA TRACT N/A 10/22/2016   Procedure: LIGATION OF INTERNAL FISTULA TRACT;  Surgeon: Leighton Ruff, MD;  Location: [facility];  Service: General;  Laterality: N/A;  . ORIF FEMUR FRACTURE Left 10/09/2012   Procedure: OPEN REDUCTION INTERNAL FIXATION (ORIF) DISTAL FEMUR FRACTURE;  Surgeon: Rozanna Box, MD;  Location: [place];  Service: Orthopedics;  Laterality: Left;  . RETINOPATHY SURGERY Bilateral 1980's?  . TRANSTHORACIC ECHOCARDIOGRAM  02-18-2011   mild LVH,  ef 55-60%,  grade I diastolic dysfunction/  mild TR/  RV systolic pressure increased consistant with moderate  pulmonary hypertension   Social History   Socioeconomic History  . Marital status: Married    Spouse name: [name]  . Number of children: 0  . Years of education: 76  . Highest education level: Not on file  Occupational History  . Occupation: Retired    Fish farm manager: RETIRED  Social Needs  . Financial resource strain: Not on file  . Food insecurity:    Worry: Not on file    Inability: Not on file  . Transportation needs:    Medical: Not on file    Non-medical: Not on file  Tobacco Use  . Smoking status: Never Smoker  . Smokeless tobacco: Never Used  Substance and Sexual Activity  . Alcohol use: No  . Drug use: No  . Sexual activity: Not Currently  Lifestyle  . Physical activity:    Days per week: Not on file    Minutes per session: Not on file  . Stress: Not on file  Relationships  . Social connections:    Talks on phone: Not on file    Gets together: Not on file    Attends religious service: Not on file    Active member of club or organization: Not on file    Attends meetings of clubs or organizations: Not on file    Relationship status: Not on file  Other Topics Concern  . Not on file  Social History Narrative   Lives with husband.    Caffeine use: none   Outpatient Encounter Medications as of 11/15/2017  Medication Sig  . ACCU-CHEK SOFTCLIX LANCETS lancets Use as instructed 2 x daily  E11.65  . amLODipine (NORVASC) 5 MG tablet Take 1 tablet (5 mg total) by mouth daily.  Marland Kitchen atorvastatin (LIPITOR) 10 MG tablet TAKE 1 TABLET EVERY DAY  . carvedilol (COREG) 6.25 MG tablet Take 1 tablet (6.25 mg total) by mouth 2 (two) times daily with a meal.  . cetirizine (ZYRTEC) 5 MG tablet Take 1 tablet (5 mg total) by mouth daily.  . febuxostat (ULORIC) 40 MG tablet Take 40 mg by mouth daily.  . fluticasone (FLONASE) 50 MCG/ACT nasal spray Place 2 sprays into both nostrils daily.  . furosemide (LASIX) 40 MG tablet Take 40 mg by mouth 2 (two) times a week.   Marland Kitchen glucose blood  (ACCU-CHEK AVIVA PLUS) test strip TEST 2 TIMES DAILY  E11.65  . levothyroxine (SYNTHROID, LEVOTHROID) 75 MCG tablet TAKE 1 TABLET EVERY DAY BEFORE BREAKFAST  . pantoprazole (PROTONIX) 20 MG tablet Take 20 mg by mouth daily.  . TRESIBA FLEXTOUCH 100 UNIT/ML SOPN FlexTouch Pen INJECT 10 UNITS DAILY (DOSE WILL BE TITRATED TO A MAX OF 30 UNITS DAILY)  . ULTICARE MICRO PEN NEEDLES 32G X 4 MM MISC   . Vitamin D, Ergocalciferol, (DRISDOL) 50000 units CAPS capsule Take 1  capsule (50,000 Units total) by mouth every 7 (seven) days.  . [DISCONTINUED] ACCU-CHEK SOFTCLIX LANCETS lancets Use as instructed 2 x daily  E11.65   Facility-Administered Encounter Medications as of 11/15/2017  Medication  . 0.9 %  sodium chloride infusion   ALLERGIES: Allergies  Allergen Reactions  . Aspirin Nausea And Vomiting  . Ciprofloxacin Other (See Comments)    Upset Stomach  . Codeine Nausea And Vomiting    Makes me sick   . Losartan     Hyperkalemia   . Micardis [Telmisartan] Other (See Comments)    unknown  . Nexium [Esomeprazole Magnesium] Other (See Comments)    Causes internal bleeding  . Niaspan [Niacin Er] Other (See Comments)    Reaction is unknown  . Onglyza [Saxagliptin] Other (See Comments)    Reaction is unknown  . Other     No otc pain medications  . Rofecoxib Other (See Comments)    unknown  . Simvastatin   . Tequin [Gatifloxacin] Other (See Comments)    Reaction is unknown  . Welchol [Colesevelam Hcl]   . Amoxicillin Nausea And Vomiting and Rash    Has patient had a PCN reaction causing immediate rash, facial/tongue/throat swelling, SOB or lightheadedness with hypotension: Yes Has patient had a PCN reaction causing severe rash involving mucus membranes or skin necrosis: no  Has patient had a PCN reaction that required hospitalization No Has patient had a PCN reaction occurring within the last 10 years: No If all of the above answers are "NO", then may proceed with Cephalosporin use.     VACCINATION STATUS: Immunization History  Administered Date(s) Administered  . Influenza, High Dose Seasonal PF 03/05/2016, 03/14/2017  . Influenza,inj,Quad PF,6+ Mos 02/16/2013, 02/27/2015  . Pneumococcal Conjugate-13 10/11/2014  . Pneumococcal Polysaccharide-23 10/10/2012  . Tdap 10/06/2012    Diabetes  She presents for her follow-up diabetic visit. She has type 2 diabetes mellitus. Onset time: She was diagnosed at approximate age of 68 years . Her disease course has been stable. There are no hypoglycemic associated symptoms. Pertinent negatives for hypoglycemia include no confusion, headaches, pallor or seizures. Associated symptoms include fatigue and visual change. Pertinent negatives for diabetes include no chest pain, no polydipsia and no polyphagia. There are no hypoglycemic complications. Symptoms are stable. Diabetic complications include nephropathy, peripheral neuropathy, PVD and retinopathy. Risk factors for coronary artery disease include diabetes mellitus, dyslipidemia, hypertension and sedentary lifestyle. Current diabetic treatment includes oral agent (monotherapy). She is compliant with treatment most of the time. Her weight is stable. She is following a generally unhealthy diet. When asked about meal planning, she reported none. She has not had a previous visit with a dietitian. She never participates in exercise. Her home blood glucose trend is decreasing steadily. Her breakfast blood glucose range is generally 130-140 mg/dl. Her bedtime blood glucose range is generally 130-140 mg/dl. Her overall blood glucose range is 130-140 mg/dl. An ACE inhibitor/angiotensin II receptor blocker is being taken. Eye exam is current.  Thyroid Problem  Presents for initial visit. Onset time: 15 years. Symptoms include fatigue and visual change. Patient reports no cold intolerance, diarrhea, heat intolerance or palpitations. The symptoms have been stable. Past treatments include levothyroxine. The  following procedures have not been performed: thyroidectomy.  Hypertension  This is a chronic problem. The current episode started more than 1 year ago. Pertinent negatives include no chest pain, headaches, palpitations or shortness of breath. Past treatments include angiotensin blockers. Hypertensive end-organ damage includes PVD and retinopathy. Identifiable causes of  hypertension include a thyroid problem.    Review of Systems  Constitutional: Positive for fatigue. Negative for unexpected weight change.  HENT: Negative for trouble swallowing and voice change.   Eyes: Negative for visual disturbance.  Respiratory: Negative for cough, shortness of breath and wheezing.   Cardiovascular: Negative for chest pain, palpitations and leg swelling.  Gastrointestinal: Negative for diarrhea, nausea and vomiting.  Endocrine: Negative for cold intolerance, heat intolerance, polydipsia and polyphagia.  Musculoskeletal: Positive for arthralgias, back pain, gait problem, joint swelling and myalgias.          Skin: Negative for color change, pallor, rash and wound.  Neurological: Negative for seizures and headaches.  Psychiatric/Behavioral: Negative for confusion and suicidal ideas.    Objective:    BP 135/68   Pulse 70   Ht 5\' 2"  (1.575 m)   Wt 159 lb (72.1 kg)   BMI 29.08 kg/m   Wt Readings from Last 3 Encounters:  11/15/17 159 lb (72.1 kg)  10/20/17 159 lb 3.2 oz (72.2 kg)  10/12/17 160 lb (72.6 kg)    Physical Exam  Constitutional: She is oriented to person, place, and time. She appears well-developed.  HENT:  Head: Normocephalic and atraumatic.  Eyes: EOM are normal.  Neck: Normal range of motion. Neck supple. No tracheal deviation present. No thyromegaly present.  Cardiovascular: Normal rate and regular rhythm. Exam reveals decreased pulses.  Pulses:      Dorsalis pedis pulses are 0 on the right side, and 0 on the left side.       Posterior tibial pulses are 0 on the right side, and  0 on the left side.  Pulmonary/Chest: Effort normal.  Abdominal: There is no tenderness. There is no guarding.  Musculoskeletal: She exhibits no edema.       Arms: She uses a walker to get around.  Neurological: She is alert and oriented to person, place, and time. A sensory deficit is present. No cranial nerve deficit. Coordination normal.  Skin: Skin is warm and dry. No rash noted. No erythema. No pallor.  Psychiatric: She has a normal mood and affect. Judgment normal.    Results for orders placed or performed in visit on 11/15/17  Hemoglobin A1c  Result Value Ref Range   Hemoglobin A1C 6.8    Diabetic Labs (most recent): Lab Results  Component Value Date   HGBA1C 6.8 10/20/2017   HGBA1C 7.7 12/02/2016   HGBA1C 6.6 (H) 05/05/2016   Lipid Panel     Component Value Date/Time   CHOL 160 03/14/2017 0824   CHOL 105 09/11/2012 1057   TRIG 170 (H) 03/14/2017 0824   TRIG 170 (H) 10/11/2014 1159   TRIG 79 09/11/2012 1057   HDL 37 (L) 03/14/2017 0824   HDL 53 10/11/2014 1159   HDL 41 09/11/2012 1057   CHOLHDL 4.3 03/14/2017 0824   LDLCALC 89 03/14/2017 0824   LDLCALC 50 02/14/2014 1012   LDLCALC 48 09/11/2012 1057     Assessment & Plan:   1. Type 2 diabetes mellitus with stage 4 chronic kidney disease, without long-term current use of insulin ([place])  - Patient has currently controlled asymptomatic type 2 DM since  72 years of age. -She came with stable glycemic profile and A1c of 6.8%, slowly improving from 10%.  - Her renal function is improving.   -her  diabetes is complicated by PAD, CKD, neuropathy and patient remains at a high risk for more acute and chronic complications of diabetes which include CAD, CVA,  CKD, retinopathy, and neuropathy. These are all discussed in detail with the patient.  - I have counseled the patient on diet management  by adopting a carbohydrate restricted/protein rich diet.  -  Suggestion is made for her to avoid simple carbohydrates  from  her diet including Cakes, Sweet Desserts / Pastries, Ice Cream, Soda (diet and regular), Sweet Tea, Candies, Chips, Cookies, Store Bought Juices, Alcohol in Excess of  1-2 drinks a day, Artificial Sweeteners, and "Sugar-free" Products. This will help patient to have stable blood glucose profile and potentially avoid unintended weight gain.  - I encouraged the patient to switch to  unprocessed or minimally processed complex starch and increased protein intake (animal or plant source), fruits, and vegetables.  - Patient is advised to stick to a routine mealtimes to eat 3 meals  a day and avoid unnecessary snacks ( to snack only to correct hypoglycemia).   - I have approached patient with the following individualized plan to manage diabetes and patient agrees:   - She came with better and safer blood glucose profile .  -Her husband continues to offer to help. -She is advised to continue Tresiba 10 units every morning with breakfast, associated with strict monitoring of blood glucose 2 times a day-before breakfast and daily at bedtime. - First priority in this patient will be to avoid hypoglycemia.  -Patient is encouraged to call clinic for blood glucose levels less than 70 or above 300 mg /dl.  -Patient is not a candidate for MTF, Incretin therapy.  Her renal function is progressively improving.  - Patient specific target  A1c;  LDL, HDL, Triglycerides, and  Waist Circumference were discussed in detail.  2) BP/HTN: Her blood pressure is controlled to target.  She is advised to continue her current blood pressure medications including continue current medications including carvedilol and amlodipine.   Has documented allergy to ARB. 3) Lipids/HPL: Her recent lipid panel showed LDL of 89.  She will continue atorvastatin 10 mg p.o. nightly.   4)  Weight/Diet: CDE Consult has been initiated, she has limited ability to exercise.  5)  Hypothyroidism:  - Her current thyroid function tests are  consistent with appropriate replacement.  I advised her to continue levothyroxine 75 g by mouth every morning.  - We discussed about correct intake of levothyroxine, at fasting, with water, separated by at least 30 minutes from breakfast, and separated by more than 4 hours from calcium, iron, multivitamins, acid reflux medications (PPIs). -Patient is made aware of the fact that thyroid hormone replacement is needed for life, dose to be adjusted by periodic monitoring of thyroid function tests.  5) Chronic Care/Health Maintenance:  -Patient is  on ACEI/ARB and Statin medications and encouraged to continue to follow up with Ophthalmology, Podiatrist at least yearly or according to recommendations, and advised to   stay away from smoking. I have recommended yearly flu vaccine and pneumonia vaccination at least every 5 years;  and  sleep for at least 7 hours a day.  - I advised patient to maintain close follow up with Eustaquio Maize, MD for primary care needs.  - Time spent with the patient: 25 min, of which >50% was spent in reviewing her blood glucose logs , discussing her hypo- and hyper-glycemic episodes, reviewing her current and  previous labs and insulin doses and developing a plan to avoid hypo- and hyper-glycemia. Please refer to Patient Instructions for Blood Glucose Monitoring and Insulin/Medications Dosing Guide"  in media tab for additional information.  [name] participated in the discussions, expressed understanding, and voiced agreement with the above plans.  All questions were answered to her satisfaction. she is encouraged to contact clinic should she have any questions or concerns prior to her return visit.  Follow up plan: - Return in about 4 months (around 03/17/2018) for meter, and logs.  Glade Lloyd, MD Phone: [phone]  Fax: [phone]  -  This note was partially dictated with voice recognition software. Similar sounding words can be transcribed inadequately or  may not  be corrected upon review.  11/15/2017, 12:18 PM

## 2017-11-22 DIAGNOSIS — E1151 Type 2 diabetes mellitus with diabetic peripheral angiopathy without gangrene: Secondary | ICD-10-CM | POA: Diagnosis not present

## 2017-11-22 DIAGNOSIS — B351 Tinea unguium: Secondary | ICD-10-CM | POA: Diagnosis not present

## 2017-11-22 DIAGNOSIS — M79676 Pain in unspecified toe(s): Secondary | ICD-10-CM | POA: Diagnosis not present

## 2017-11-22 DIAGNOSIS — L84 Corns and callosities: Secondary | ICD-10-CM | POA: Diagnosis not present

## 2017-12-03 ENCOUNTER — Other Ambulatory Visit: Payer: Self-pay | Admitting: Pediatrics

## 2017-12-03 DIAGNOSIS — E559 Vitamin D deficiency, unspecified: Secondary | ICD-10-CM

## 2017-12-07 ENCOUNTER — Other Ambulatory Visit: Payer: Self-pay | Admitting: Pediatrics

## 2017-12-07 DIAGNOSIS — E782 Mixed hyperlipidemia: Secondary | ICD-10-CM

## 2017-12-12 DIAGNOSIS — Z79899 Other long term (current) drug therapy: Secondary | ICD-10-CM | POA: Diagnosis not present

## 2017-12-12 DIAGNOSIS — M2550 Pain in unspecified joint: Secondary | ICD-10-CM | POA: Diagnosis not present

## 2017-12-12 DIAGNOSIS — M1A09X1 Idiopathic chronic gout, multiple sites, with tophus (tophi): Secondary | ICD-10-CM | POA: Diagnosis not present

## 2017-12-12 DIAGNOSIS — N183 Chronic kidney disease, stage 3 (moderate): Secondary | ICD-10-CM | POA: Diagnosis not present

## 2017-12-12 DIAGNOSIS — Z6829 Body mass index (BMI) 29.0-29.9, adult: Secondary | ICD-10-CM | POA: Diagnosis not present

## 2017-12-12 DIAGNOSIS — E663 Overweight: Secondary | ICD-10-CM | POA: Diagnosis not present

## 2017-12-19 ENCOUNTER — Other Ambulatory Visit: Payer: Self-pay

## 2017-12-19 NOTE — Patient Outreach (Signed)
[facility] ([facility]) Care Management  12/19/2017   Shereena H Blankenbeckler 10/27/1945 [id number]  Subjective: Telephone call to the patient for assessment. HIPAA verified.  The patient states that she has been doing well.  The patient had her visit with Dr Nida on 5/30 and her a1c was 6.8.  She states that she is monitoring her diet and has lost 7 lbs.  She denies any pain or falls.  She is adherent with her medications.  The patient asked me to call and check if she has any refills on her insulin.  [facility] and she has refills. I call the patient back and explained the process to her for more refills.  The patient verbalized understanding.  She has an appointment scheduled for August to see Dr. Nida.   Current Medications:  Current Outpatient Medications  Medication Sig Dispense Refill  . ACCU-CHEK SOFTCLIX LANCETS lancets Use as instructed 2 x daily  E11.65 100 each 5  . amLODipine (NORVASC) 5 MG tablet Take 1 tablet (5 mg total) by mouth daily. 90 tablet 1  . atorvastatin (LIPITOR) 10 MG tablet TAKE 1 TABLET EVERY DAY 90 tablet 0  . carvedilol (COREG) 6.25 MG tablet Take 1 tablet (6.25 mg total) by mouth 2 (two) times daily with a meal. 180 tablet 1  . cetirizine (ZYRTEC) 5 MG tablet Take 1 tablet (5 mg total) by mouth daily. 30 tablet 1  . febuxostat (ULORIC) 40 MG tablet Take 40 mg by mouth daily.    . furosemide (LASIX) 40 MG tablet Take 40 mg by mouth 2 (two) times a week.     . glucose blood (ACCU-CHEK AVIVA PLUS) test strip TEST 2 TIMES DAILY  E11.65 100 each 5  . levothyroxine (SYNTHROID, LEVOTHROID) 75 MCG tablet TAKE 1 TABLET EVERY DAY BEFORE BREAKFAST 90 tablet 1  . pantoprazole (PROTONIX) 20 MG tablet Take 20 mg by mouth daily.    . TRESIBA FLEXTOUCH 100 UNIT/ML SOPN FlexTouch Pen INJECT 10 UNITS DAILY (DOSE WILL BE TITRATED TO A MAX OF 30 UNITS DAILY) 15 mL 2  . ULTICARE MICRO PEN NEEDLES 32G X 4 MM MISC     . Vitamin D, Ergocalciferol, (DRISDOL)  50000 units CAPS capsule TAKE 1 CAPSULE EVERY 7 (SEVEN) DAYS, THEN DISCONTINUE AFTER 8 WEEK COURSE. 8 capsule 0  . fluticasone (FLONASE) 50 MCG/ACT nasal spray Place 2 sprays into both nostrils daily. (Patient not taking: Reported on 12/19/2017) 16 g 6   Current Facility-Administered Medications  Medication Dose Route Frequency Provider Last Rate Last Dose  . 0.9 %  sodium chloride infusion  500 mL Intravenous Once Armbruster, Steven P, MD        Functional Status:  No flowsheet data found.  Fall/Depression Screening: Fall Risk  12/19/2017 10/20/2017 09/02/2017  Falls in the past year? No No No  Number falls in past yr: - - -  Injury with Fall? - - -  Comment - - -  Risk Factor Category  - - -  Risk for fall due to : - - -  Follow up - - -   PHQ 2/9 Scores 10/20/2017 06/28/2017 06/24/2017 06/24/2017 03/14/2017 12/02/2016 09/20/2016  PHQ - 2 Score 0 0 0 0 0 0 0  PHQ- 9 Score - - - - - - -    Assessment: Patient will benefit from health coach outreach for disease maintenance and support.   [facility] CM Care Plan Problem One     Most   Recent Value  [facility] Long Term Goal   In 90 days the patient will verbalized that she has mantained her a1c level of 6.3  [facility] Long Term Goal Start Date  12/19/17  Interventions for Problem One Long Term Goal   discussed dietary intake,  reviewed and discussed medication management  [facility] CM Short Term Goal #1   Over the next 30 days, patient will attend all scheduled provider appointments  [facility] CM Short Term Goal #1 Start Date  12/19/17  [facility] CM Short Term Goal #1 Met Date  12/19/17  Interventions for Short Term Goal #1  patient attended her scheduled appointment on 5/30 with Dr NIda     Plan: RN Health Coach will contact patient in the month of August and patient agrees to next outreach.    RN, BSN, CPC RN Health Coach Disease Management [facility] Direct Dial:  [phone]  Fax: [phone]        

## 2018-01-20 ENCOUNTER — Other Ambulatory Visit: Payer: Self-pay

## 2018-01-20 NOTE — Patient Outreach (Signed)
[place] [facility]) Care Management  01/20/2018  [name] 1946/02/22 [id number]    1st outreach to the patient for assessment. No answer. HIPAA compliant voicemail left with contact information.  Plan: RN Health Coach will send unsuccessful outreach letter to patient.  RN Health Coach will make another outreach attempt to patient within 3-4 business days if no return call back from patient.  [name] RN, BSN, [place] Direct Dial:  [phone]  Fax: [phone]

## 2018-01-26 ENCOUNTER — Other Ambulatory Visit: Payer: Self-pay | Admitting: Pediatrics

## 2018-01-26 ENCOUNTER — Other Ambulatory Visit: Payer: Self-pay

## 2018-01-26 DIAGNOSIS — E034 Atrophy of thyroid (acquired): Secondary | ICD-10-CM

## 2018-01-26 NOTE — Patient Outreach (Signed)
[place] [facility]) Care Management  01/26/2018   [name] 07/27/1945 [id number]  Subjective: Telephone call placed to the patient. HIPAA verified.  The patient states that she had not been feeling well for the past two days. She states that she has had a virus and had some nauseas, diarrhea and vomiting but feeling a little better today.  She has been staying hydrated, eating light and able to keep food down. Her blood sugar this morning was 124.  She denies any pain or falls.  She states that she is just a little sore.  She is being adherent with her medications.  She has an appointment with her physician on September 24 th and plans to receive her flu shot then.  Current Medications:  Current Outpatient Medications  Medication Sig Dispense Refill  . ACCU-CHEK SOFTCLIX LANCETS lancets Use as instructed 2 x daily  E11.65 100 each 5  . amLODipine (NORVASC) 5 MG tablet Take 1 tablet (5 mg total) by mouth daily. 90 tablet 1  . atorvastatin (LIPITOR) 10 MG tablet TAKE 1 TABLET EVERY DAY 90 tablet 0  . carvedilol (COREG) 6.25 MG tablet Take 1 tablet (6.25 mg total) by mouth 2 (two) times daily with a meal. 180 tablet 1  . cetirizine (ZYRTEC) 5 MG tablet Take 1 tablet (5 mg total) by mouth daily. 30 tablet 1  . febuxostat (ULORIC) 40 MG tablet Take 40 mg by mouth daily.    . furosemide (LASIX) 40 MG tablet Take 40 mg by mouth 2 (two) times a week.     Marland Kitchen glucose blood (ACCU-CHEK AVIVA PLUS) test strip TEST 2 TIMES DAILY  E11.65 100 each 5  . levothyroxine (SYNTHROID, LEVOTHROID) 75 MCG tablet TAKE 1 TABLET EVERY DAY BEFORE BREAKFAST 90 tablet 1  . TRESIBA FLEXTOUCH 100 UNIT/ML SOPN FlexTouch Pen INJECT 10 UNITS DAILY (DOSE WILL BE TITRATED TO A MAX OF 30 UNITS DAILY) 15 mL 2  . ULTICARE MICRO PEN NEEDLES 32G X 4 MM MISC     . Vitamin D, Ergocalciferol, (DRISDOL) 50000 units CAPS capsule TAKE 1 CAPSULE EVERY 7 (SEVEN) DAYS, THEN DISCONTINUE AFTER 8 WEEK COURSE. 8 capsule 0  .  fluticasone (FLONASE) 50 MCG/ACT nasal spray Place 2 sprays into both nostrils daily. (Patient not taking: Reported on 12/19/2017) 16 g 6  . pantoprazole (PROTONIX) 20 MG tablet Take 20 mg by mouth daily.     Current Facility-Administered Medications  Medication Dose Route Frequency Provider Last Rate Last Dose  . 0.9 %  sodium chloride infusion  500 mL Intravenous Once Armbruster, Carlota Raspberry, MD        Functional Status:  No flowsheet data found.  Fall/Depression Screening: Fall Risk  01/26/2018 12/19/2017 10/20/2017  Falls in the past year? No No No  Number falls in past yr: - - -  Injury with Fall? - - -  Comment - - -  Risk Factor Category  - - -  Risk for fall due to : - - -  Follow up - - -   PHQ 2/9 Scores 10/20/2017 06/28/2017 06/24/2017 06/24/2017 03/14/2017 12/02/2016 09/20/2016  PHQ - 2 Score 0 0 0 0 0 0 0  PHQ- 9 Score - - - - - - -    Assessment: Patient will continue to benefit from health coach outreach for disease management and support.  [facility] CM Care Plan Problem One     Most Recent Value  [facility] Long Term Goal   In 90 days the  patient will verbalized that she has mantained her a1c level of 6.3  [facility] Long Term Goal Start Date  01/26/18  Interventions for Problem One Long Term Goal  Reviewed medication, diet and cbg readings      Plan: [place] will contact patient in the month of  December and patient agrees to next outreach.  [name] RN, BSN, [place] Direct Dial:  [phone]  Fax: [phone]

## 2018-01-31 DIAGNOSIS — M79676 Pain in unspecified toe(s): Secondary | ICD-10-CM | POA: Diagnosis not present

## 2018-01-31 DIAGNOSIS — B351 Tinea unguium: Secondary | ICD-10-CM | POA: Diagnosis not present

## 2018-01-31 DIAGNOSIS — L84 Corns and callosities: Secondary | ICD-10-CM | POA: Diagnosis not present

## 2018-01-31 DIAGNOSIS — E1151 Type 2 diabetes mellitus with diabetic peripheral angiopathy without gangrene: Secondary | ICD-10-CM | POA: Diagnosis not present

## 2018-02-08 ENCOUNTER — Other Ambulatory Visit: Payer: Self-pay | Admitting: Pediatrics

## 2018-02-08 DIAGNOSIS — E782 Mixed hyperlipidemia: Secondary | ICD-10-CM

## 2018-02-09 NOTE — Telephone Encounter (Signed)
OV 03/01/18

## 2018-02-10 ENCOUNTER — Other Ambulatory Visit: Payer: Self-pay | Admitting: Pediatrics

## 2018-02-14 ENCOUNTER — Ambulatory Visit: Payer: Medicare Other | Admitting: Pediatrics

## 2018-03-01 ENCOUNTER — Encounter: Payer: Self-pay | Admitting: Pediatrics

## 2018-03-01 ENCOUNTER — Ambulatory Visit (INDEPENDENT_AMBULATORY_CARE_PROVIDER_SITE_OTHER): Payer: Medicare Other | Admitting: Pediatrics

## 2018-03-01 ENCOUNTER — Telehealth: Payer: Self-pay | Admitting: Pediatrics

## 2018-03-01 VITALS — BP 136/76 | HR 76 | Temp 97.8°F | Ht 62.0 in | Wt 159.6 lb

## 2018-03-01 DIAGNOSIS — E1122 Type 2 diabetes mellitus with diabetic chronic kidney disease: Secondary | ICD-10-CM | POA: Diagnosis not present

## 2018-03-01 DIAGNOSIS — N184 Chronic kidney disease, stage 4 (severe): Secondary | ICD-10-CM

## 2018-03-01 DIAGNOSIS — Z794 Long term (current) use of insulin: Secondary | ICD-10-CM

## 2018-03-01 DIAGNOSIS — Z23 Encounter for immunization: Secondary | ICD-10-CM | POA: Diagnosis not present

## 2018-03-01 DIAGNOSIS — Z8739 Personal history of other diseases of the musculoskeletal system and connective tissue: Secondary | ICD-10-CM | POA: Diagnosis not present

## 2018-03-01 DIAGNOSIS — I10 Essential (primary) hypertension: Secondary | ICD-10-CM

## 2018-03-01 DIAGNOSIS — E034 Atrophy of thyroid (acquired): Secondary | ICD-10-CM

## 2018-03-01 DIAGNOSIS — E782 Mixed hyperlipidemia: Secondary | ICD-10-CM | POA: Diagnosis not present

## 2018-03-01 LAB — HEMOGLOBIN A1C: HEMOGLOBIN A1C: 7.2

## 2018-03-01 LAB — BAYER DCA HB A1C WAIVED: HB A1C: 7.2 % — AB (ref <7.0)

## 2018-03-01 MED ORDER — AMLODIPINE BESYLATE 5 MG PO TABS: 5.0000 mg | ORAL_TABLET | Freq: Every day | ORAL | 1 refills | Status: DC

## 2018-03-01 MED ORDER — LEVOTHYROXINE SODIUM 75 MCG PO TABS: 75.0000 ug | ORAL_TABLET | Freq: Every day | ORAL | 1 refills | Status: DC

## 2018-03-01 MED ORDER — ATORVASTATIN CALCIUM 10 MG PO TABS: 10.0000 mg | ORAL_TABLET | Freq: Every day | ORAL | 1 refills | Status: DC

## 2018-03-01 MED ORDER — CARVEDILOL 6.25 MG PO TABS: 6.2500 mg | ORAL_TABLET | Freq: Two times a day (BID) | ORAL | 1 refills | Status: DC

## 2018-03-01 NOTE — Progress Notes (Signed)
Subjective:   Patient ID: [name], female    DOB: [birth year], 72 y.o.   MRN: [id number] CC: Medical Management of Chronic Issues  HPI: [name] is a 72 y.o. female   Has had some congestion, runny nose past couple days.  Diabetes: Following with Dr. Dorris Fetch.  On long-acting insulin, taking 10 units at night.  No recent lows, usually low 100s in the morning.  H/o gout: no recent flares. Was started on allopurinol then switched within a few weeks to febuxostat by rheumatology. Our office recently received message from [place] about black box warning on febuxostat, asking if switch to allopurinol would be appropriate. Last appt with rheum in 09/2017, next in 1 year. No flares since control achieved with febuxostat. She does take colchicine 0.6mg  once when she has more meat or other foods that tended to cause gout flares.  HTN: blood pressures usually 786V at home systolic.  HLD: taking statin, no side effects.  Hypothyroidism: Taking medicine regularly.  Needs a refill  Relevant past medical, surgical, family and social history reviewed. Allergies and medications reviewed and updated. Social History   Tobacco Use  Smoking Status Never Smoker  Smokeless Tobacco Never Used   ROS: Per HPI   Objective:    BP 136/76   Pulse 76   Temp 97.8 F (36.6 C) (Oral)   Ht 5\' 2"  (1.575 m)   Wt 159 lb 9.6 oz (72.4 kg)   BMI 29.19 kg/m   Wt Readings from Last 3 Encounters:  03/01/18 159 lb 9.6 oz (72.4 kg)  11/15/17 159 lb (72.1 kg)  10/20/17 159 lb 3.2 oz (72.2 kg)    Gen: NAD, alert, cooperative with exam, NCAT EYES: EOMI, no conjunctival injection, or no icterus ENT:  TMs  Dull gray b/l, OP without erythema LYMPH: no cervical LAD CV: NRRR, normal S1/S2, no murmur, distal pulses 2+ b/l Resp: CTABL, no wheezes, normal WOB Abd: +BS, soft, NTND.  Ext: No edema, warm Neuro: Alert and oriented  Assessment & Plan:  Sheronica was seen today for medical management of chronic  issues.  Diagnoses and all orders for this visit:  Type 2 diabetes mellitus with stage 4 chronic kidney disease, with long-term current use of insulin (HCC) A1c 7.2, continue to avoid sugary foods and beverages.  Continue insulin.  Will up with endocrine as scheduled.  Has microalbuminuria, intolerant of ACEi and ARB due to hyperkalemia. -     Bayer DCA Hb A1c Waived  Hypothyroidism due to acquired atrophy of thyroid Stable, continue below -     levothyroxine (SYNTHROID, LEVOTHROID) 75 MCG tablet; Take 1 tablet (75 mcg total) by mouth daily before breakfast.  Essential hypertension Initially elevated, much improved at recheck.  Continue current medicines. -     amLODipine (NORVASC) 5 MG tablet; Take 1 tablet (5 mg total) by mouth daily. -     carvedilol (COREG) 6.25 MG tablet; Take 1 tablet (6.25 mg total) by mouth 2 (two) times daily with a meal.  Mixed hyperlipidemia Stable, continue below -     atorvastatin (LIPITOR) 10 MG tablet; Take 1 tablet (10 mg total) by mouth daily.  H/O: gout We will check uric acid level.  Symptoms have been very well controlled off of Oxistat.  Per pharmacy recommendations, may be worth trial of allopurinol but will discuss with her rheumatologist first, Leafy Kindle in [facility] rheumatology.  Message left with her office with my contact information. -     Uric Acid  Encounter for immunization -     Flu vaccine HIGH DOSE PF   Follow up plan: Return in about 6 weeks (around 04/12/2018). Assunta Found, MD [place]

## 2018-03-01 NOTE — Patient Instructions (Addendum)
[facility] Appointment: [phone]  [phone] [place]  For Gout:  Call me back with name of medicine you are taking at home. If you are still on febuxostat/Uloric, we may try switching to alternate called allopurinol.

## 2018-03-01 NOTE — Telephone Encounter (Signed)
Pt was told to call back and let dr Evette Doffing know that she is taking uloric 80 mg and colcrys 6 mg

## 2018-03-02 LAB — URIC ACID: URIC ACID: 1.6 mg/dL — AB (ref 2.5–7.1)

## 2018-03-10 ENCOUNTER — Other Ambulatory Visit: Payer: Self-pay | Admitting: Pediatrics

## 2018-03-10 NOTE — Telephone Encounter (Signed)
Can you confirm with pt that she is still taking this? OK to send in 90 days with 1 refill if she is taking it.

## 2018-03-20 ENCOUNTER — Encounter: Payer: Self-pay | Admitting: "Endocrinology

## 2018-03-20 ENCOUNTER — Ambulatory Visit (INDEPENDENT_AMBULATORY_CARE_PROVIDER_SITE_OTHER): Payer: Medicare Other | Admitting: "Endocrinology

## 2018-03-20 VITALS — BP 154/70 | HR 69 | Ht 62.0 in | Wt 160.0 lb

## 2018-03-20 DIAGNOSIS — E039 Hypothyroidism, unspecified: Secondary | ICD-10-CM

## 2018-03-20 DIAGNOSIS — Z794 Long term (current) use of insulin: Secondary | ICD-10-CM | POA: Diagnosis not present

## 2018-03-20 DIAGNOSIS — I10 Essential (primary) hypertension: Secondary | ICD-10-CM | POA: Diagnosis not present

## 2018-03-20 DIAGNOSIS — E1122 Type 2 diabetes mellitus with diabetic chronic kidney disease: Secondary | ICD-10-CM

## 2018-03-20 DIAGNOSIS — N183 Chronic kidney disease, stage 3 (moderate): Secondary | ICD-10-CM | POA: Diagnosis not present

## 2018-03-20 DIAGNOSIS — E782 Mixed hyperlipidemia: Secondary | ICD-10-CM | POA: Diagnosis not present

## 2018-03-20 NOTE — Progress Notes (Signed)
Endocrinology follow-up note   Subjective:    Patient ID: [name], female    DOB: 07-Feb-1946. Patient is to follow-up for uncontrolled type 2 diabetes, hypothyroidism, hypertension, hyperlipidemia.  Past Medical History:  Diagnosis Date  . Anal fistula   . Anemia in chronic renal disease    Aranesp injection --  when Hg <11, last injection 12-24-14.  Marland Kitchen Anxiety   . Arthritis    knees and hand/fingers. "broke back"-being evaluated for this"weakness left leg"  . Cataract    both eyes done  . 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 CKD (chronic kidney disease), stage III [facility])    nephrologist--  dr Mercy Moore-- [place]  07-09-2016  . Complication of anesthesia    post-op confusion   . Constipation   . Coronary artery disease   . Diabetic gastroparesis ([place])   . Diabetic retinopathy ([place])    bilateral --  monitored by dr [name]  . Diverticulosis of colon   . GAD (generalized anxiety disorder)   . GERD (gastroesophageal reflux disease)   . History of colon polyps    benign  . History of esophagitis   . History of GI bleed    upper 2009  due to esophagitis  &  2002  due to Mallory-Weiss tear  . History of hyperkalemia    pt had previously been canceled twice dos due to elevated K+, 07-29-2016 last date cancelled -- pt visited pcp same day treated w/ kayexelate and K+ came down, pt brought all her medication's in to pcp office and found pt was taking losartan that had been discontinued due to ckd, pcp stated this was cause of elevated K+  . 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 History of rectal abscess    12-29-2004  bedside I & D  . Hyperlipidemia   . Hypertension   . Hypothyroidism   . LAFB (left anterior fascicular block)   . Right bundle branch block   . Sacral decubitus ulcer    since 2014- 01-02-15 remains with wound" gauze dressing changes daily.  . Type II diabetes mellitus ([place])    Past Surgical History:    Procedure Laterality Date  . APPLICATION OF A-CELL OF EXTREMITY N/A 04/07/2015   Procedure: A CELL PLACMENT;  Surgeon: Loel Lofty Dillingham, DO;  Location: [place];  Service: Plastics;  Laterality: N/A;  . CARDIOVASCULAR STRESS TEST  12-30-2004   normal perfusion study/  no ischemia or infartion/  normal LV wall function and wall motion , ef 66%  . CATARACT EXTRACTION W/ INTRAOCULAR LENS  IMPLANT, BILATERAL  1995  . COLONOSCOPY  2008   w/Dr.Brodie   . COLONOSCOPY W/ POLYPECTOMY  last one 2008  . COMPRESSION HIP SCREW Right 05/18/2014   Procedure: COMPRESSION HIP;  Surgeon: Carole Civil, MD;  Location: [facility];  Service: Orthopedics;  Laterality: Right;  . ESOPHAGOGASTRODUODENOSCOPY  last one 01-09-2011  . EVALUATION UNDER ANESTHESIA WITH FISTULECTOMY N/A 01/06/2015   Procedure: EXAM UNDER ANESTHESIA , placement of seton;  Surgeon: Jackolyn Confer, MD;  Location: [facility];  Service: General;  Laterality: N/A;  . I&D EXTREMITY N/A 04/07/2015   Procedure: IRRIGATION AND DEBRIDEMENT ISCHIAL ULCER;  Surgeon: Loel Lofty Dillingham, DO;  Location: [place];  Service: Plastics;  Laterality: N/A;  . INCISION AND DRAINAGE OF WOUND N/A 09/30/2014   Procedure: IRRIGATION AND DEBRIDEMENT SACRAL WOUND, EXCISION OF PERIRECTAL TRACT WITH PLACEMENT OF ACCELL;  Surgeon: Theodoro Kos, DO;  Location: [facility];  Service: Plastics;  Laterality: N/A;  . LIGATION OF INTERNAL FISTULA TRACT N/A 10/22/2016   Procedure: LIGATION OF INTERNAL FISTULA TRACT;  Surgeon: Leighton Ruff, MD;  Location: [facility];  Service: General;  Laterality: N/A;  . ORIF FEMUR FRACTURE Left 10/09/2012   Procedure: OPEN REDUCTION INTERNAL FIXATION (ORIF) DISTAL FEMUR FRACTURE;  Surgeon: Rozanna Box, MD;  Location: [place];  Service: Orthopedics;  Laterality: Left;  . RETINOPATHY SURGERY Bilateral 1980's?  . TRANSTHORACIC ECHOCARDIOGRAM  02-18-2011   mild LVH,  ef 55-60%,  grade I diastolic dysfunction/  mild TR/   RV systolic pressure increased consistant with moderate pulmonary hypertension   Social History   Socioeconomic History  . Marital status: Married    Spouse name: [name]  . Number of children: 0  . Years of education: 48  . Highest education level: Not on file  Occupational History  . Occupation: Retired    Fish farm manager: RETIRED  Social Needs  . Financial resource strain: Not on file  . Food insecurity:    Worry: Not on file    Inability: Not on file  . Transportation needs:    Medical: Not on file    Non-medical: Not on file  Tobacco Use  . Smoking status: Never Smoker  . Smokeless tobacco: Never Used  Substance and Sexual Activity  . Alcohol use: No  . Drug use: No  . Sexual activity: Not Currently  Lifestyle  . Physical activity:    Days per week: Not on file    Minutes per session: Not on file  . Stress: Not on file  Relationships  . Social connections:    Talks on phone: Not on file    Gets together: Not on file    Attends religious service: Not on file    Active member of club or organization: Not on file    Attends meetings of clubs or organizations: Not on file    Relationship status: Not on file  Other Topics Concern  . Not on file  Social History Narrative   Lives with husband.    Caffeine use: none   Outpatient Encounter Medications as of 03/20/2018  Medication Sig  . ACCU-CHEK SOFTCLIX LANCETS lancets Use as instructed 2 x daily  E11.65  . amLODipine (NORVASC) 5 MG tablet Take 1 tablet (5 mg total) by mouth daily.  Marland Kitchen atorvastatin (LIPITOR) 10 MG tablet Take 1 tablet (10 mg total) by mouth daily.  . carvedilol (COREG) 6.25 MG tablet Take 1 tablet (6.25 mg total) by mouth 2 (two) times daily with a meal.  . cholecalciferol (VITAMIN D) 1000 units tablet Take 1,000 Units by mouth daily.  . colchicine 0.6 MG tablet Take 0.6 mg by mouth daily as needed.  . febuxostat (ULORIC) 40 MG tablet Take 80 mg by mouth daily.  . furosemide (LASIX) 40 MG tablet Take 40 mg  by mouth 2 (two) times a week.   Marland Kitchen glucose blood (ACCU-CHEK AVIVA PLUS) test strip TEST 2 TIMES DAILY  E11.65  . levothyroxine (SYNTHROID, LEVOTHROID) 75 MCG tablet Take 1 tablet (75 mcg total) by mouth daily before breakfast.  . pantoprazole (PROTONIX) 20 MG tablet TAKE 1 TABLET EVERY DAY  . TRESIBA FLEXTOUCH 100 UNIT/ML SOPN FlexTouch Pen INJECT 10 UNITS DAILY (DOSE WILL BE TITRATED TO A MAX OF 30 UNITS DAILY)  . ULTICARE MICRO PEN NEEDLES 32G X 4 MM MISC    Facility-Administered Encounter Medications  as of 03/20/2018  Medication  . 0.9 %  sodium chloride infusion   ALLERGIES: Allergies  Allergen Reactions  . Aspirin Nausea And Vomiting  . Ciprofloxacin Other (See Comments)    Upset Stomach  . Codeine Nausea And Vomiting    Makes me sick   . Losartan     Hyperkalemia   . Micardis [Telmisartan] Other (See Comments)    unknown  . Nexium [Esomeprazole Magnesium] Other (See Comments)    Causes internal bleeding  . Niaspan [Niacin Er] Other (See Comments)    Reaction is unknown  . Onglyza [Saxagliptin] Other (See Comments)    Reaction is unknown  . Other     No otc pain medications  . Rofecoxib Other (See Comments)    unknown  . Simvastatin   . Tequin [Gatifloxacin] Other (See Comments)    Reaction is unknown  . Welchol [Colesevelam Hcl]   . Amoxicillin Nausea And Vomiting and Rash    Has patient had a PCN reaction causing immediate rash, facial/tongue/throat swelling, SOB or lightheadedness with hypotension: Yes Has patient had a PCN reaction causing severe rash involving mucus membranes or skin necrosis: no  Has patient had a PCN reaction that required hospitalization No Has patient had a PCN reaction occurring within the last 10 years: No If all of the above answers are "NO", then may proceed with Cephalosporin use.    VACCINATION STATUS: Immunization History  Administered Date(s) Administered  . Influenza, High Dose Seasonal PF 03/05/2016, 03/14/2017, 03/01/2018  .  Influenza,inj,Quad PF,6+ Mos 02/16/2013, 02/27/2015  . Pneumococcal Conjugate-13 10/11/2014  . Pneumococcal Polysaccharide-23 10/10/2012  . Tdap 10/06/2012    Diabetes  She presents for her follow-up diabetic visit. She has type 2 diabetes mellitus. Onset time: She was diagnosed at approximate age of 63 years . Her disease course has been stable. There are no hypoglycemic associated symptoms. Pertinent negatives for hypoglycemia include no confusion, headaches, pallor or seizures. Associated symptoms include fatigue and visual change. Pertinent negatives for diabetes include no chest pain, no polydipsia and no polyphagia. There are no hypoglycemic complications. Symptoms are stable. Diabetic complications include nephropathy, peripheral neuropathy, PVD and retinopathy. Risk factors for coronary artery disease include diabetes mellitus, dyslipidemia, hypertension and sedentary lifestyle. Current diabetic treatment includes oral agent (monotherapy). She is compliant with treatment most of the time. Her weight is stable. She is following a generally unhealthy diet. When asked about meal planning, she reported none. She has not had a previous visit with a dietitian. She never participates in exercise. Her home blood glucose trend is decreasing steadily. Her breakfast blood glucose range is generally 130-140 mg/dl. Her bedtime blood glucose range is generally 130-140 mg/dl. Her overall blood glucose range is 130-140 mg/dl. An ACE inhibitor/angiotensin II receptor blocker is being taken. Eye exam is current.  Thyroid Problem  Presents for initial visit. Onset time: 15 years. Symptoms include fatigue and visual change. Patient reports no cold intolerance, diarrhea, heat intolerance or palpitations. The symptoms have been stable. Past treatments include levothyroxine. The following procedures have not been performed: thyroidectomy.  Hypertension  This is a chronic problem. The current episode started more than 1  year ago. Pertinent negatives include no chest pain, headaches, palpitations or shortness of breath. Past treatments include angiotensin blockers. Hypertensive end-organ damage includes PVD and retinopathy. Identifiable causes of hypertension include a thyroid problem.    Review of Systems  Constitutional: Positive for fatigue. Negative for unexpected weight change.  HENT: Negative for trouble swallowing and voice  change.   Eyes: Negative for visual disturbance.  Respiratory: Negative for cough, shortness of breath and wheezing.   Cardiovascular: Negative for chest pain, palpitations and leg swelling.  Gastrointestinal: Negative for diarrhea, nausea and vomiting.  Endocrine: Negative for cold intolerance, heat intolerance, polydipsia and polyphagia.  Musculoskeletal: Positive for arthralgias, back pain, gait problem, joint swelling and myalgias.          Skin: Negative for color change, pallor, rash and wound.  Neurological: Negative for seizures and headaches.  Psychiatric/Behavioral: Negative for confusion and suicidal ideas.    Objective:    BP (!) 154/70   Pulse 69   Ht 5\' 2"  (1.575 m)   Wt 160 lb (72.6 kg)   BMI 29.26 kg/m   Wt Readings from Last 3 Encounters:  03/20/18 160 lb (72.6 kg)  03/01/18 159 lb 9.6 oz (72.4 kg)  11/15/17 159 lb (72.1 kg)    Physical Exam  Constitutional: She is oriented to person, place, and time. She appears well-developed.  HENT:  Head: Normocephalic and atraumatic.  Eyes: EOM are normal.  Neck: Normal range of motion. Neck supple. No tracheal deviation present. No thyromegaly present.  Cardiovascular: Normal rate and regular rhythm. Exam reveals decreased pulses.  Pulses:      Dorsalis pedis pulses are 0 on the right side, and 0 on the left side.       Posterior tibial pulses are 0 on the right side, and 0 on the left side.  Pulmonary/Chest: Effort normal.  Abdominal: There is no tenderness. There is no guarding.  Musculoskeletal: She  exhibits no edema.       Arms: She uses a walker to get around.  Neurological: She is alert and oriented to person, place, and time. A sensory deficit is present. No cranial nerve deficit. Coordination normal.  Skin: Skin is warm and dry. No rash noted. No erythema. No pallor.  Psychiatric: She has a normal mood and affect. Judgment normal.    Results for orders placed or performed in visit on 03/20/18  Hemoglobin A1c  Result Value Ref Range   Hemoglobin A1C 7.2    Diabetic Labs (most recent): Lab Results  Component Value Date   HGBA1C 7.2 03/01/2018   HGBA1C 6.8 10/20/2017   HGBA1C 7.7 12/02/2016   Lipid Panel     Component Value Date/Time   CHOL 160 03/14/2017 0824   CHOL 105 09/11/2012 1057   TRIG 170 (H) 03/14/2017 0824   TRIG 170 (H) 10/11/2014 1159   TRIG 79 09/11/2012 1057   HDL 37 (L) 03/14/2017 0824   HDL 53 10/11/2014 1159   HDL 41 09/11/2012 1057   CHOLHDL 4.3 03/14/2017 0824   LDLCALC 89 03/14/2017 0824   LDLCALC 50 02/14/2014 1012   LDLCALC 48 09/11/2012 1057     Assessment & Plan:   1. Type 2 diabetes mellitus with stage 4 chronic kidney disease, without long-term current use of insulin ([place])  - Patient has currently controlled asymptomatic type 2 DM since  72 years of age. -She came with stable glycemic profile and A1c of 7.2%, slowly improving from 10%.  -her  diabetes is complicated by PAD, CKD, neuropathy and patient remains at a high risk for more acute and chronic complications of diabetes which include CAD, CVA, CKD, retinopathy, and neuropathy. These are all discussed in detail with the patient.  - I have counseled the patient on diet management  by adopting a carbohydrate restricted/protein rich diet.  -  Suggestion is made  for her to avoid simple carbohydrates  from her diet including Cakes, Sweet Desserts / Pastries, Ice Cream, Soda (diet and regular), Sweet Tea, Candies, Chips, Cookies, Store Bought Juices, Alcohol in Excess of  1-2 drinks a  day, Artificial Sweeteners, and "Sugar-free" Products. This will help patient to have stable blood glucose profile and potentially avoid unintended weight gain.  - I encouraged the patient to switch to  unprocessed or minimally processed complex starch and increased protein intake (animal or plant source), fruits, and vegetables.  - Patient is advised to stick to a routine mealtimes to eat 3 meals  a day and avoid unnecessary snacks ( to snack only to correct hypoglycemia).   - I have approached patient with the following individualized plan to manage diabetes and patient agrees:   - She came with better and safer blood glucose profile , only on low-dose basal insulin. -Her husband continues to offer to help. -She is advised to continue Tresiba 10 units  every morning with breakfast, associated with strict monitoring of blood glucose 2 times a day-before breakfast and daily at bedtime. - First priority in this patient will be to avoid hypoglycemia.  -Patient is encouraged to call clinic for blood glucose levels less than 70 or above 300 mg /dl.  -Patient is not a candidate for MTF, Incretin therapy.  Her renal function is progressively improving.  - Patient specific target  A1c;  LDL, HDL, Triglycerides, and  Waist Circumference were discussed in detail.  2) BP/HTN: Her blood pressure is not controlled to target.    She is advised to continue her current blood pressure medications including  carvedilol and amlodipine.   Has documented allergy to ARB.  3) Lipids/HPL: Her recent lipid panel showed LDL of 89.  She will continue atorvastatin 10 mg p.o. nightly.   4)  Weight/Diet: CDE Consult has been initiated, she has limited ability to exercise.  5)  Hypothyroidism:  - Her current thyroid function tests are consistent with appropriate replacement.  I advised her to continue levothyroxine 75 g by mouth every morning.   - We discussed about correct intake of levothyroxine, at fasting, with  water, separated by at least 30 minutes from breakfast, and separated by more than 4 hours from calcium, iron, multivitamins, acid reflux medications (PPIs). -Patient is made aware of the fact that thyroid hormone replacement is needed for life, dose to be adjusted by periodic monitoring of thyroid function tests.  5) Chronic Care/Health Maintenance:  -Patient is  on ACEI/ARB and Statin medications and encouraged to continue to follow up with Ophthalmology, Podiatrist at least yearly or according to recommendations, and advised to   stay away from smoking. I have recommended yearly flu vaccine and pneumonia vaccination at least every 5 years;  and  sleep for at least 7 hours a day.  - I advised patient to maintain close follow up with Eustaquio Maize, MD for primary care needs.   - Time spent with the patient: 25 min, of which >50% was spent in reviewing her blood glucose logs , discussing her hypo- and hyper-glycemic episodes, reviewing her current and  previous labs and insulin doses and developing a plan to avoid hypo- and hyper-glycemia. Please refer to Patient Instructions for Blood Glucose Monitoring and Insulin/Medications Dosing Guide"  in media tab for additional information. [name] participated in the discussions, expressed understanding, and voiced agreement with the above plans.  All questions were answered to her satisfaction. she is encouraged to contact  clinic should she have any questions or concerns prior to her return visit.   Follow up plan: - Return in about 6 months (around 09/19/2018) for Follow up with Pre-visit Labs, Meter, and Logs.  Glade Lloyd, MD Phone: [phone]  Fax: [phone]  -  This note was partially dictated with voice recognition software. Similar sounding words can be transcribed inadequately or may not  be corrected upon review.  03/20/2018, 9:23 AM

## 2018-03-20 NOTE — Patient Instructions (Signed)

## 2018-03-27 DIAGNOSIS — E113553 Type 2 diabetes mellitus with stable proliferative diabetic retinopathy, bilateral: Secondary | ICD-10-CM | POA: Diagnosis not present

## 2018-03-27 DIAGNOSIS — H353132 Nonexudative age-related macular degeneration, bilateral, intermediate dry stage: Secondary | ICD-10-CM | POA: Diagnosis not present

## 2018-03-27 DIAGNOSIS — H35352 Cystoid macular degeneration, left eye: Secondary | ICD-10-CM | POA: Diagnosis not present

## 2018-03-27 DIAGNOSIS — H3562 Retinal hemorrhage, left eye: Secondary | ICD-10-CM | POA: Diagnosis not present

## 2018-03-27 LAB — HM DIABETES EYE EXAM

## 2018-04-25 DIAGNOSIS — L84 Corns and callosities: Secondary | ICD-10-CM | POA: Diagnosis not present

## 2018-04-25 DIAGNOSIS — E1151 Type 2 diabetes mellitus with diabetic peripheral angiopathy without gangrene: Secondary | ICD-10-CM | POA: Diagnosis not present

## 2018-04-25 DIAGNOSIS — B351 Tinea unguium: Secondary | ICD-10-CM | POA: Diagnosis not present

## 2018-04-25 DIAGNOSIS — M79676 Pain in unspecified toe(s): Secondary | ICD-10-CM | POA: Diagnosis not present

## 2018-04-28 ENCOUNTER — Other Ambulatory Visit: Payer: Self-pay

## 2018-04-28 NOTE — Patient Outreach (Signed)
[place] [facility]) Care Management  04/28/2018   [name] Jul 15, 1945 [id number]  Subjective: Successful telephone to the patient for assessment. HIPAA verified.  The patient states that she is doing well.  The patient states that she had a follow up with her physician for diabetes last month.  Her a1c has gone down from 6.4 to 6.1.  Her blood sugar this morning was 136.  She states that the reason it is that high is her blood sugar was low before going to bed and she ate two peanut butter crackers to bring her blood sugar up be fore bed.  She is taking her meds as prescribed and monitoring her diet. She denies any falls.  She states that since she has done so well that they have extended her next diabetes appointment out for six months.  She does not have to go back until May on 2020.  Current Medications:  Current Outpatient Medications  Medication Sig Dispense Refill  . ACCU-CHEK SOFTCLIX LANCETS lancets Use as instructed 2 x daily  E11.65 100 each 5  . amLODipine (NORVASC) 5 MG tablet Take 1 tablet (5 mg total) by mouth daily. 90 tablet 1  . atorvastatin (LIPITOR) 10 MG tablet Take 1 tablet (10 mg total) by mouth daily. 90 tablet 1  . carvedilol (COREG) 6.25 MG tablet Take 1 tablet (6.25 mg total) by mouth 2 (two) times daily with a meal. 180 tablet 1  . cholecalciferol (VITAMIN D) 1000 units tablet Take 1,000 Units by mouth daily.    . colchicine 0.6 MG tablet Take 0.6 mg by mouth daily as needed.    . febuxostat (ULORIC) 40 MG tablet Take 80 mg by mouth daily.    . furosemide (LASIX) 40 MG tablet Take 40 mg by mouth 2 (two) times a week.     Marland Kitchen glucose blood (ACCU-CHEK AVIVA PLUS) test strip TEST 2 TIMES DAILY  E11.65 100 each 5  . levothyroxine (SYNTHROID, LEVOTHROID) 75 MCG tablet Take 1 tablet (75 mcg total) by mouth daily before breakfast. 90 tablet 1  . pantoprazole (PROTONIX) 20 MG tablet TAKE 1 TABLET EVERY DAY 90 tablet 1  . TRESIBA FLEXTOUCH 100 UNIT/ML SOPN  FlexTouch Pen INJECT 10 UNITS DAILY (DOSE WILL BE TITRATED TO A MAX OF 30 UNITS DAILY) 15 mL 2  . ULTICARE MICRO PEN NEEDLES 32G X 4 MM MISC      Current Facility-Administered Medications  Medication Dose Route Frequency Provider Last Rate Last Dose  . 0.9 %  sodium chloride infusion  500 mL Intravenous Once Armbruster, Carlota Raspberry, MD        Functional Status:  No flowsheet data found.  Fall/Depression Screening: Fall Risk  04/28/2018 03/01/2018 01/26/2018  Falls in the past year? 0 No No  Number falls in past yr: - - -  Injury with Fall? - - -  Comment - - -  Risk Factor Category  - - -  Risk for fall due to : - - -  Follow up - - -   PHQ 2/9 Scores 03/01/2018 10/20/2017 06/28/2017 06/24/2017 06/24/2017 03/14/2017 12/02/2016  PHQ - 2 Score 0 0 0 0 0 0 0  PHQ- 9 Score - - - - - - -    Assessment: Patient will continue to benefit from health coach outreach for disease management and support.  [facility] CM Care Plan Problem One     Most Recent Value  [facility] Long Term Goal   In 90 days the patient  will verbalized that she has mantained her a1c level of 6.1  [facility] Long Term Goal Start Date  04/28/18  Interventions for Problem One Long Term Goal  Discussed cbg readings, and medication adherence     Plan: RN Health Coach will contact patient in the month of March and patient agrees to next outreach.  [name] RN, BSN, [place] Direct Dial:  [phone]  Fax: [phone]

## 2018-05-31 ENCOUNTER — Other Ambulatory Visit: Payer: Self-pay | Admitting: "Endocrinology

## 2018-07-03 DIAGNOSIS — N183 Chronic kidney disease, stage 3 (moderate): Secondary | ICD-10-CM | POA: Diagnosis not present

## 2018-07-03 DIAGNOSIS — I129 Hypertensive chronic kidney disease with stage 1 through stage 4 chronic kidney disease, or unspecified chronic kidney disease: Secondary | ICD-10-CM | POA: Diagnosis not present

## 2018-07-03 DIAGNOSIS — N184 Chronic kidney disease, stage 4 (severe): Secondary | ICD-10-CM | POA: Diagnosis not present

## 2018-07-03 DIAGNOSIS — N2581 Secondary hyperparathyroidism of renal origin: Secondary | ICD-10-CM | POA: Diagnosis not present

## 2018-07-03 DIAGNOSIS — M109 Gout, unspecified: Secondary | ICD-10-CM | POA: Diagnosis not present

## 2018-07-03 DIAGNOSIS — D631 Anemia in chronic kidney disease: Secondary | ICD-10-CM | POA: Diagnosis not present

## 2018-07-03 DIAGNOSIS — E559 Vitamin D deficiency, unspecified: Secondary | ICD-10-CM | POA: Diagnosis not present

## 2018-07-03 DIAGNOSIS — R809 Proteinuria, unspecified: Secondary | ICD-10-CM | POA: Diagnosis not present

## 2018-07-05 ENCOUNTER — Other Ambulatory Visit: Payer: Self-pay

## 2018-07-05 NOTE — Patient Outreach (Signed)
[place] [facility]) Care Management  07/05/2018  [name] ANDALON 06/15/1945 [id number]    Returned call to the patient.  HIPAA verified.  The patient states that her insulin [name] and Colchicine are going to cost her 500 dollars to purchase and  she can't afford it and would like help.  She states that she does have refills at the pharmacy.  The patient states that she has eight days of her insulin left. Notified the patient that I will put in a referral for them to call her about her medications.  Plan:  RN Health Coach will put in a referral to pharmacy for medication assistance.  RN Health Coach will call the patient at the next scheduled interval.  [name] RN, BSN, [place] Direct Dial:  [phone]  Fax: [phone]

## 2018-07-06 ENCOUNTER — Telehealth: Payer: Self-pay | Admitting: Pharmacist

## 2018-07-06 NOTE — Telephone Encounter (Signed)
-----   Message from Jiles Harold sent at 07/05/2018  2:16 PM EST ----- Regarding: Referral: Order for Cherlynn June  Referral from [name], RN  "Please see below request"  Forde Radon ----- Message ----- From: [name], RN Sent: 07/05/2018   1:58 PM EST To: Thn Cm Communication Orders Subject: Order for BLEU, MOISAN                       Patient Name: [name], [name]([id number]) Sex: Female DOB: Sep 12, 1945    PCP: [name]   Center: [facility]   Types of orders made on 07/05/2018: Nursing  Order Date:07/05/2018 Ordering User:COLES, Nedra Hai [[id number]] Encounter [place], [place], RN [[id number]] Authorizing Provider : [name], DO [[id number]] Department:[facility][[id number]]  Order Specific Information Order: Comm to Pharmacy [Custom: [id number]]  Order #: [id number] Qty: 1   Priority: Routine  Class: Clinic Performed   Comment:The patient states that her insulin Tresiba and Colchicine are going            to cost her 500 dollars to purchase and  she can'[name] afford it and            would like help. Sh e has 8 days of her insulin left.                        Alwyn Ren has helped the patient previously and would like this             referral sent back to her.     Reason for Consult -> Medication Assistance       Priority: Routine  Class: Clinic Performed   Comment:The patient states that her insulin Tresiba and Colchicine are going            to cost her 500 dollars to purchase and  she can'[name] afford it and            w ould like help. She has 8 days of her insulin left.                        Alwyn Ren has helped the patient previously and would like this             referral sent back to her.     Reason for Consult -> Medication Assistance

## 2018-07-06 NOTE — Patient Outreach (Signed)
[place] [facility]) Care Management  [place]   07/06/2018  [name] 1945-10-04 [id number]  Reason for referral: medication assistance  Referral source: [facility] Telephonic Nurse Referral medication(s): [name] Current insurance: [employer] Medicare Part D  HPI: Depression, hypothyroidism, osteoporosis, vitamin D deficiency, hypertension, Type 2 diabetes, CKD stage III, anxiety and gout.   Objective: Allergies  Allergen Reactions  . Aspirin Nausea And Vomiting  . Ciprofloxacin Other (See Comments)    Upset Stomach  . Codeine Nausea And Vomiting    Makes me sick   . Losartan     Hyperkalemia   . Micardis [Telmisartan] Other (See Comments)    unknown  . Nexium [Esomeprazole Magnesium] Other (See Comments)    Causes internal bleeding  . Niaspan [Niacin Er] Other (See Comments)    Reaction is unknown  . Onglyza [Saxagliptin] Other (See Comments)    Reaction is unknown  . Other     No otc pain medications  . Rofecoxib Other (See Comments)    unknown  . Simvastatin   . Tequin [Gatifloxacin] Other (See Comments)    Reaction is unknown  . Welchol [Colesevelam Hcl]   . Amoxicillin Nausea And Vomiting and Rash    Has patient had a PCN reaction causing immediate rash, facial/tongue/throat swelling, SOB or lightheadedness with hypotension: Yes Has patient had a PCN reaction causing severe rash involving mucus membranes or skin necrosis: no  Has patient had a PCN reaction that required hospitalization No Has patient had a PCN reaction occurring within the last 10 years: No If all of the above answers are "NO", then may proceed with Cephalosporin use.     Medications Reviewed Today    Reviewed by [name], [facility] (Pharmacist) on 07/06/18 at [place] List Status: <None>  Medication Order Taking? Sig Documenting Provider Last Dose Status Informant  0.9 %  sodium chloride infusion [id number]   Armbruster, [name]  Active   ACCU-CHEK [facility] LANCETS  lancets [id number] Yes Use as instructed 2 x daily  E11.65 [name] Taking Active   amLODipine (NORVASC) 5 MG tablet [id number] Yes Take 1 tablet (5 mg total) by mouth daily. [name] Taking Active   atorvastatin (LIPITOR) 10 MG tablet [id number] Yes Take 1 tablet (10 mg total) by mouth daily. [name] Taking Active   carvedilol (COREG) 6.25 MG tablet [id number] Yes Take 1 tablet (6.25 mg total) by mouth 2 (two) times daily with a meal. [name] Taking Active   cholecalciferol (VITAMIN D) 1000 units tablet [id number] Yes Take 1,000 Units by mouth daily. Provider, Historical, [name] Taking Active   colchicine 0.6 MG tablet [id number] Yes Take 0.6 mg by mouth daily as needed. Provider, Historical, [name] Taking Active   febuxostat (ULORIC) 40 MG tablet [id number] Yes Take 80 mg by mouth daily. Provider, Historical, [name] Taking Active   fluticasone (FLONASE) 50 MCG/ACT nasal spray [id number] Yes Use 2 sprays in each nostril daily Provider, Historical, [name] Taking Active   furosemide (LASIX) 40 MG tablet [id number] Yes Take 40 mg by mouth 2 (two) times a week.  Provider, Historical, [name] Taking Active   glucose blood test strip [id number] Yes TEST 2 TIMES DAILY E11.65 [name] Taking Active   levothyroxine (SYNTHROID, LEVOTHROID) 75 MCG tablet [id number] Yes Take 1 tablet (75 mcg total) by mouth daily before breakfast. [name] Taking Active   pantoprazole (PROTONIX) 20 MG tablet [id number] Yes  TAKE 1 TABLET EVERY DAY [name] Taking Active   TRESIBA FLEXTOUCH 100 UNIT/ML SOPN FlexTouch Pen [id number] Yes INJECT 10 UNITS DAILY (DOSE WILL BE TITRATED TO A MAX OF 30 UNITS DAILY) [name] Taking Active   ULTICARE MICRO PEN NEEDLES 32G X 4 MM MISC [id number] Yes  Provider, Historical, [name] Taking Active           Assessment:  Drugs sorted by system:  Neurologic/Psychologic:  Cardiovascular: Amlodipine, Atorvastatin,  Carvedilol, Furosemide,   Pulmonary/Allergy: Fluticasone  Gastrointestinal: Pantoprazole  Endocrine: Levothyroxine, [name]  Renal/Gout Colchicine, Febuxostat  Vitamins/Minerals/Supplements: Cholecalciferol  Miscellaneous:  Medication Review Findings:  Drug/Drug Interaction-The combination of a statin with colchicine may result in an increased potential of myopathy. (patient only takes colchicine prn gout)  Medication Assistance Findings:  Medication assistance needs identified.   Called pharmacy to check on the billing. The pharmacist billed the claim while I was on the phone, and reported the cost as >$500.  Reviewed the patient's plan.  She has [employer].  On her plan, she has a $435 deductible that is applicable to tier 3-5 medications.  The price of the billed [name] reflects the deductible plus copayment.  If her provider deems therapeutically appropriate, patient could be switched to [name] as it is available from [employer] without having to meet an out-of-pocket expenditure. [name] requires patients to spend $1000 out of pocket)   Additional medication assistance options reviewed with patient as warranted:  No other options identified  Plan: Route note to [name] and ask about the switch to [name]  If he approves the switch, call [name] with the patient on the phone to get a free coupon   Also if he approves, route note to [employer] to send [name] to patient and provider.  Call [name] office tomorrow.  Call Patient back tomorrow.   [name], PharmD, [place] Clinical Pharmacist [phone]

## 2018-07-07 ENCOUNTER — Other Ambulatory Visit: Payer: Self-pay | Admitting: Pediatrics

## 2018-07-07 ENCOUNTER — Other Ambulatory Visit: Payer: Self-pay | Admitting: "Endocrinology

## 2018-07-07 ENCOUNTER — Other Ambulatory Visit: Payer: Self-pay | Admitting: Pharmacist

## 2018-07-07 ENCOUNTER — Telehealth: Payer: Self-pay

## 2018-07-07 DIAGNOSIS — I10 Essential (primary) hypertension: Secondary | ICD-10-CM

## 2018-07-07 MED ORDER — BASAGLAR KWIKPEN 100 UNIT/ML ~~LOC~~ SOPN: 10.0000 [IU] | PEN_INJECTOR | Freq: Every day | SUBCUTANEOUS | 1 refills | Status: DC

## 2018-07-07 NOTE — Telephone Encounter (Signed)
Done

## 2018-07-07 NOTE — Telephone Encounter (Signed)
Dr. Dorris Fetch can you please change Kirsty from [place] to [employer] for insurance purposes, I will need a written Rx, Please advise?

## 2018-07-07 NOTE — Patient Outreach (Signed)
[place] [facility]) Care Management  07/07/2018  [name] [birth year] [id number]   Called Dr. Liliane Channel office to request the patient be switched from [place] to [place]. Patient has a $435 deductible on her insurance. If she went to get [place] filled today it would have a $500 copay.  Patient said she cannot afford this.   Left a message with Dr. Liliane Channel office requesting a prescription for Basaglar be sent to [facility] in [place].  Patient said she had nine doses left on yesterday.   Patient was called. HIPAA identifiers were obtained. Patient was informed I did not hear back from Dr. Dorris Fetch. She said she has 6-7 doses of insulin left.  Plan: Await a call back from the provider as their office closed at 12:00  Follow up on Monday. Call [employer] to get a free coupon on Monday. Send letter to [employer], CPhT after I hear back from Dr. Dorris Fetch.    [name], PharmD, [place] Clinical Pharmacist [phone]

## 2018-07-10 ENCOUNTER — Other Ambulatory Visit: Payer: Self-pay | Admitting: Pharmacist

## 2018-07-10 ENCOUNTER — Other Ambulatory Visit: Payer: Self-pay

## 2018-07-10 MED ORDER — BASAGLAR KWIKPEN 100 UNIT/ML ~~LOC~~ SOPN: 10.0000 [IU] | PEN_INJECTOR | Freq: Every day | SUBCUTANEOUS | 1 refills | Status: DC

## 2018-07-10 NOTE — Patient Outreach (Signed)
[place] [facility]) Care Management  07/10/2018  [name] 17-Jan-1946 [id number]   Called [facility] on the patient's behalf to check to see if Dr. Dorris Fetch called in [place] for the patient as requested last Friday morning. Unfortunately, the Pharmacist said it had not been called in.    Dr. Liliane Channel office was called. A message was left on their voicemail in reference to the prescription. (I physically spoke with a person last week).  Patient was called. Unfortunately, she did not answer the phone. HIPAA compliant message could not be left because the phone rang >30 times without a voicemail box to pick up.  Plan: Call patient back in 2-3 business days as she expressed that she was running low on insulin last week.

## 2018-07-11 ENCOUNTER — Other Ambulatory Visit: Payer: Self-pay | Admitting: Pharmacy Technician

## 2018-07-11 ENCOUNTER — Other Ambulatory Visit: Payer: Self-pay | Admitting: Pharmacist

## 2018-07-11 NOTE — Patient Outreach (Addendum)
[name]) Care Management  07/11/2018  [name] 06/09/1945 [id number]   Dr. Dorris Fetch called in [name] as requested to [facility]  Patient was called. HIPAA identifiers were obtained.    While patient was on speaker phone, [name] was called.  Patient was awarded a free coupon. As soon as the coupon is delivered to me via email, it will be sent to [facility] to be billed.  Note will be routed to [name], [name] to send an application to the patient and provider.  [name], PharmD, BCACP [facility] Clinical Pharmacist [phone]  ADDENDUM  The coupon was not received via email. [name] back with the patient on conference. Th representative said she would fax the coupon to [facility]".  I called [facility] back and let them know the coupon was coming.  Plan:  Follow up tomorrow.   [name], PharmD, [place] Clinical Pharmacist [phone]

## 2018-07-11 NOTE — Patient Outreach (Signed)
[place] [facility]) Care Management  07/11/2018  [name] 04-20-1946 [id number]                                                   Medication Assistance Referral  Referral From: [place]  Medication/Company: [name] Patient application portion:  Mailed Provider application portion: Faxed  to Dr. Loni Beckwith    Follow up:  Will follow up with patient in 5-7 business days to confirm application(s) have been received.  [name] P. [name], [place] Management [phone]

## 2018-07-12 ENCOUNTER — Other Ambulatory Visit: Payer: Self-pay | Admitting: Pharmacist

## 2018-07-12 ENCOUNTER — Other Ambulatory Visit: Payer: Self-pay

## 2018-07-12 MED ORDER — BASAGLAR KWIKPEN 100 UNIT/ML ~~LOC~~ SOPN: 10.0000 [IU] | PEN_INJECTOR | Freq: Every day | SUBCUTANEOUS | 1 refills | Status: DC

## 2018-07-12 NOTE — Patient Outreach (Signed)
[facility]) Care Management  07/12/2018  BRANDILEE PIES February 07, 1946 [id number]   Had the coupon for Basaglar from [place] sent to [facility]    Called Dr. Liliane Channel office back and spoke with Maudie Mercury on specifics on how to write the prescription so the copay would go through.  Called pharmacy. Ryan confirmed Engineer, agricultural had been filled and had a $0.00 and that the patient's husband had already picked it up.  Called Patient. HIPAA identifiers were obtained.  Reminded patient about the patient assistance forms that were mailed to her home and about what information she needs to include.  Plan: Call patient back in 3 weeks.   [name], PharmD, [place] Clinical Pharmacist [phone]

## 2018-07-18 DIAGNOSIS — B351 Tinea unguium: Secondary | ICD-10-CM | POA: Diagnosis not present

## 2018-07-18 DIAGNOSIS — M79676 Pain in unspecified toe(s): Secondary | ICD-10-CM | POA: Diagnosis not present

## 2018-07-18 DIAGNOSIS — E1151 Type 2 diabetes mellitus with diabetic peripheral angiopathy without gangrene: Secondary | ICD-10-CM | POA: Diagnosis not present

## 2018-07-18 DIAGNOSIS — L84 Corns and callosities: Secondary | ICD-10-CM | POA: Diagnosis not present

## 2018-07-20 ENCOUNTER — Other Ambulatory Visit: Payer: Self-pay | Admitting: Pharmacy Technician

## 2018-07-20 ENCOUNTER — Telehealth: Payer: Self-pay | Admitting: *Deleted

## 2018-07-20 NOTE — Patient Outreach (Signed)
[place] [facility] Care Management  07/20/2018  [name] May 10, 1946 [id number]  Successful outreach call placed to patient in regards to [employer] patient assistance application for [employer].  Spoke to patient, HIPAA identifiers verified. Inquired if patient had received the application that was mailed to her. Patient informed me that she had received the application and mailed it back on Saturday 07/15/2018.  Will followup with patient in 10-14 business days if application has not been received back to me.  Tameshia Bonneville P. Alleene Stoy, [place] Management [phone]

## 2018-07-20 NOTE — Telephone Encounter (Signed)
[name] called left a message stating she is calling on behalf of Dr Dorris Fetch from the patient assistant program and would like a return call [phone]

## 2018-07-28 ENCOUNTER — Other Ambulatory Visit: Payer: Self-pay

## 2018-07-28 NOTE — Patient Outreach (Signed)
[name]) Care Management  07/28/2018   [name] 03/20/1946 [id number]  Subjective: Successful outreach to the patient.  HIPAA verified.  The patient is doing well.  She denies any pain or falls.  Her FBS this morning is 106. The patient states that she continues to monitor her diet.  She is doing exercises in the home four times a day. Her a1c in October of 2019 was 7.2 and she is trying hard to lower it.  She states that she sometimes becomes very aggravated.  Encouraged her to continue to eat healthy take her medication and exercise and she will see the results of her work.  She verbalized understanding.  She has an appointment on 4/13 with her primary and then on 4/28 with endocrinology.  Current Medications:  Current Outpatient Medications  Medication Sig Dispense Refill  . ACCU-CHEK SOFTCLIX LANCETS lancets Use as instructed 2 x daily  E11.65 100 each 5  . amLODipine (NORVASC) 5 MG tablet TAKE 1 TABLET (5 MG TOTAL) BY MOUTH DAILY. 90 tablet 0  . atorvastatin (LIPITOR) 10 MG tablet Take 1 tablet (10 mg total) by mouth daily. 90 tablet 1  . carvedilol (COREG) 6.25 MG tablet Take 1 tablet (6.25 mg total) by mouth 2 (two) times daily with a meal. 180 tablet 1  . cholecalciferol (VITAMIN D) 1000 units tablet Take 1,000 Units by mouth daily.    . colchicine 0.6 MG tablet Take 0.6 mg by mouth daily as needed.    . febuxostat (ULORIC) 40 MG tablet Take 80 mg by mouth daily.    . fluticasone (FLONASE) 50 MCG/ACT nasal spray Use 2 sprays in each nostril daily    . furosemide (LASIX) 40 MG tablet Take 40 mg by mouth 2 (two) times a week.     Marland Kitchen glucose blood test strip TEST 2 TIMES DAILY E11.65 100 each 5  . Insulin Glargine (BASAGLAR KWIKPEN) 100 UNIT/ML SOPN Inject 0.1 mLs (10 Units total) into the skin daily. 15 mL 1  . levothyroxine (SYNTHROID, LEVOTHROID) 75 MCG tablet Take 1 tablet (75 mcg total) by mouth daily before breakfast. 90 tablet 1  . pantoprazole (PROTONIX)  20 MG tablet TAKE 1 TABLET EVERY DAY 90 tablet 1  . ULTICARE MICRO PEN NEEDLES 32G X 4 MM MISC      Current Facility-Administered Medications  Medication Dose Route Frequency Provider Last Rate Last Dose  . 0.9 %  sodium chloride infusion  500 mL Intravenous Once Armbruster, Carlota Raspberry, MD        Functional Status:  No flowsheet data found.  Fall/Depression Screening: Fall Risk  07/28/2018 04/28/2018 03/01/2018  Falls in the past year? 0 0 No  Number falls in past yr: - - -  Injury with Fall? - - -  Comment - - -  Risk Factor Category  - - -  Risk for fall due to : - - -  Follow up - - -   PHQ 2/9 Scores 07/28/2018 03/01/2018 10/20/2017 06/28/2017 06/24/2017 06/24/2017 03/14/2017  PHQ - 2 Score 0 0 0 0 0 0 0  PHQ- 9 Score - - - - - - -    Assessment: Patient will continue to benefit from health coach outreach for disease management and support. THN CM Care Plan Problem One     Most Recent Value  THN Long Term Goal   In 90 days the patient will verbalized that she has mantained her a1c level of 6.1  THN Long Term  Goal Start Date  07/28/18  Interventions for Problem One Long Term Goal  Reviewed with the patient her FBS readings, discussed about low carb diet, encoureage to continue exercises and medication adherence     Plan: [place] will contact patient in the month of May and patient agrees to next outreach.   [name] RN, BSN, [place] Direct Dial:  [phone]  Fax: [phone]

## 2018-08-02 ENCOUNTER — Ambulatory Visit: Payer: Self-pay | Admitting: Pharmacist

## 2018-08-09 ENCOUNTER — Other Ambulatory Visit: Payer: Self-pay | Admitting: Pharmacy Technician

## 2018-08-09 NOTE — Patient Outreach (Signed)
[place] [facility]) Care Management  08/09/2018  ADDALYNE VANDEHEI 1945-12-18 [id number]   Unsuccessful outreach call placed to patient in regards to [place] application for [employer].  Unfortunately patient did not answer the phone. HIPAA compliant voicemail left.  Was following up with patient in regards to her application. Patient stated she was going to place to application in the mail on Jul 15, 2018 per our phone call on Jul 20, 2018 but we have not received it back at [facility].  Will followup with 2nd outreach call pt patient in 3-5 business days.  Vieva Brummitt P. Brileigh Sevcik, [place] Management [phone]

## 2018-08-15 ENCOUNTER — Other Ambulatory Visit: Payer: Self-pay | Admitting: Pharmacist

## 2018-08-15 NOTE — Patient Outreach (Signed)
[place] [facility]) Care Management  08/15/2018  MAITE BURLISON [birth year] [id number]   Patient called to inquire about the status of her application for [place] from [facility]. HIPAA identified. Spoke with Susy Frizzle at the [facility] Office to see if the patient's application had been received. Sharee Pimple said we had not received the application as of yet.   With all the COVID-19 precautions, our office has been displaced and mail service has been spotty.  Patient has enough medication to last her 80 days.   As such, patient was asked to give it another 1-2 weeks.  Plan: Have [name] investigate and call the patient back for a status on her application.  [name], PharmD, [place] Clinical Pharmacist [phone]

## 2018-08-17 ENCOUNTER — Ambulatory Visit: Payer: Self-pay | Admitting: Pharmacist

## 2018-08-21 ENCOUNTER — Other Ambulatory Visit: Payer: Self-pay | Admitting: Pharmacy Technician

## 2018-08-21 NOTE — Patient Outreach (Signed)
[place] [facility]) Care Management  08/21/2018  [name] Sep 05, 1945 [id number]   Successful outreach call placed to patient in regards to [place] application for [employer].  Spoke to patient, HIPAA identifiers verified.  Informed patient that her application has not been received back at [facility] as of yet. Informed patient to avoid a delay in therapy and since she has some medication,  the only option we have currently is to mail out the application again. Patient was agreeable to this solution. Informed patient that the application would go out tomorrow and that it would be coming in a [facility]N envelope. Informed patient that I would fill out as much of the application as I could and would highlight the areas she needed to complete with an orange highlighter. Also informed patient to place the completed application into the envelope that will be included in the packet. Informed patient to call me when she has placed the packet in the mail. Patient verbalized understanding.  Will followup with patient in 7-10 business days if call has not been returned.  Geoffrey Mankin P. Mathieu Schloemer, [place] Management [phone]

## 2018-08-28 ENCOUNTER — Other Ambulatory Visit: Payer: Self-pay | Admitting: Pharmacy Technician

## 2018-08-28 NOTE — Patient Outreach (Signed)
[place] [facility]) Care Management  08/28/2018  CURTIS URIARTE Oct 24, 1945 [id number]  Incoming call received from patient in regards to [place] application for [employer]. HIPAA identifiers verified.  Ms. Egelston was calling to inform me that she had not received the 2nd set of applications that I mailed out to her on 08/22/2018. Informed Ms. Eichholz that I did indeed mail the application but that are mail can be a little slow due to it having to go over to the hospital to receive postage. Informed Ms. Nettle to give it a few more days and if it had not showed up then we would mail out another one.  Will followup with patient in 3-5 business days to confirm receipt of application.  Elica Almas P. Nikoleta Dady, [place] Management [phone]

## 2018-08-30 ENCOUNTER — Other Ambulatory Visit: Payer: Self-pay | Admitting: Pharmacy Technician

## 2018-08-30 NOTE — Patient Outreach (Signed)
[name]) Care Management  08/30/2018  [name] Oct 14, 1945 [id number]  Successful outreach call placed to patient in regards to [place] application for [employer].  Spoke to patient, HIPAA identifiers verified.  Patient informed that she filled the application out the best that she could as she has a hard time seeing and placed it in the mailbox today. She informed she saw the mail carrier pick the envelope up.  Will followup with patient in 10-14 business days if application is not received back.  Zyair Russi P. Aniylah Avans, [place] Management [phone]

## 2018-08-31 ENCOUNTER — Ambulatory Visit: Payer: Self-pay | Admitting: Pharmacist

## 2018-08-31 ENCOUNTER — Other Ambulatory Visit: Payer: Self-pay

## 2018-09-04 ENCOUNTER — Ambulatory Visit (INDEPENDENT_AMBULATORY_CARE_PROVIDER_SITE_OTHER): Payer: Medicare Other | Admitting: Family Medicine

## 2018-09-04 ENCOUNTER — Other Ambulatory Visit: Payer: Self-pay

## 2018-09-04 DIAGNOSIS — E1169 Type 2 diabetes mellitus with other specified complication: Secondary | ICD-10-CM | POA: Diagnosis not present

## 2018-09-04 DIAGNOSIS — N183 Chronic kidney disease, stage 3 unspecified: Secondary | ICD-10-CM

## 2018-09-04 DIAGNOSIS — E1122 Type 2 diabetes mellitus with diabetic chronic kidney disease: Secondary | ICD-10-CM | POA: Diagnosis not present

## 2018-09-04 DIAGNOSIS — E1159 Type 2 diabetes mellitus with other circulatory complications: Secondary | ICD-10-CM

## 2018-09-04 DIAGNOSIS — Z794 Long term (current) use of insulin: Secondary | ICD-10-CM | POA: Diagnosis not present

## 2018-09-04 DIAGNOSIS — I10 Essential (primary) hypertension: Secondary | ICD-10-CM | POA: Diagnosis not present

## 2018-09-04 DIAGNOSIS — I152 Hypertension secondary to endocrine disorders: Secondary | ICD-10-CM

## 2018-09-04 DIAGNOSIS — E785 Hyperlipidemia, unspecified: Secondary | ICD-10-CM

## 2018-09-04 DIAGNOSIS — E034 Atrophy of thyroid (acquired): Secondary | ICD-10-CM

## 2018-09-04 MED ORDER — CARVEDILOL 6.25 MG PO TABS: 6.2500 mg | ORAL_TABLET | Freq: Two times a day (BID) | ORAL | 1 refills | Status: DC

## 2018-09-04 MED ORDER — AMLODIPINE BESYLATE 5 MG PO TABS: 5.0000 mg | ORAL_TABLET | Freq: Every day | ORAL | 0 refills | Status: DC

## 2018-09-04 MED ORDER — PANTOPRAZOLE SODIUM 20 MG PO TBEC: 20.0000 mg | DELAYED_RELEASE_TABLET | Freq: Every day | ORAL | 1 refills | Status: DC

## 2018-09-04 MED ORDER — LEVOTHYROXINE SODIUM 75 MCG PO TABS: 75.0000 ug | ORAL_TABLET | Freq: Every day | ORAL | 0 refills | Status: DC

## 2018-09-04 MED ORDER — ATORVASTATIN CALCIUM 10 MG PO TABS: 10.0000 mg | ORAL_TABLET | Freq: Every day | ORAL | 1 refills | Status: DC

## 2018-09-04 NOTE — Progress Notes (Signed)
Telephone visit  Subjective: CC: Hypertension, hyperlipidemia, type 2 diabetes PCP: [name], DO LNL:GXQJJ H [name] is a 73 y.o. female calls for telephone consult today. Patient provides verbal consent for consult held via phone.  Location of patient: Home Location of provider: [facility] Others present for call: none  1. Type 2 Diabetes with hypertension, hyperlipidemia and CKD 4:  Patient reports High at home: 110s; Low at home: 74, Taking medication(s): 10 units of Basaglar every morning, Lipitor 10 mg, Coreg 6.25 mg p.o. twice daily, Norvasc 5 mg daily, Side effects: None  Last eye exam: December 2019, sees Dr. Radene Ou in [place].  Follow-up in August Last foot exam: Up-to-date Last A1c:  Lab Results  Component Value Date   HGBA1C 7.2 (H) 03/01/2018   Nephropathy screen indicated?: yes, has allergy to ARB/ ACE-I.  She has known chronic kidney disease and is followed by renal in [place] Last flu, zoster and/or pneumovax:  Immunization History  Administered Date(s) Administered  . Influenza, High Dose Seasonal PF 03/05/2016, 03/14/2017, 03/01/2018  . Influenza,inj,Quad PF,6+ Mos 02/16/2013, 02/27/2015  . Pneumococcal Conjugate-13 10/11/2014  . Pneumococcal Polysaccharide-23 10/10/2012  . Tdap 10/06/2012    ROS: denies polyuria, polydipsia, unintended weight loss/gain, numbness or tingling in extremities or chest pain.  No shortness of breath.  She has occasional lower extremity edema that is relieved by Lasix as needed.  She does not use this frequently.  No refills needed.  She does need refills on all of her blood pressure medicines.  2.  Hypothyroidism, atrophic Patient reports compliance with Synthroid 75 mcg daily.  No change in voice, difficulty swallowing, heart palpitations.  No weight fluctuation.    ROS: Per HPI  Allergies  Allergen Reactions  . Aspirin Nausea And Vomiting  . Ciprofloxacin Other (See Comments)    Upset Stomach  . Codeine Nausea  And Vomiting    Makes me sick   . Losartan     Hyperkalemia   . Micardis [Telmisartan] Other (See Comments)    unknown  . Nexium [Esomeprazole Magnesium] Other (See Comments)    Causes internal bleeding  . Niaspan [Niacin Er] Other (See Comments)    Reaction is unknown  . Onglyza [Saxagliptin] Other (See Comments)    Reaction is unknown  . Other     No otc pain medications  . Rofecoxib Other (See Comments)    unknown  . Simvastatin   . Tequin [Gatifloxacin] Other (See Comments)    Reaction is unknown  . Welchol [Colesevelam Hcl]   . Amoxicillin Nausea And Vomiting and Rash    Has patient had a PCN reaction causing immediate rash, facial/tongue/throat swelling, SOB or lightheadedness with hypotension: Yes Has patient had a PCN reaction causing severe rash involving mucus membranes or skin necrosis: no  Has patient had a PCN reaction that required hospitalization No Has patient had a PCN reaction occurring within the last 10 years: No If all of the above answers are "NO", then may proceed with Cephalosporin use.    Past Medical History:  Diagnosis Date  . Anal fistula   . Anemia in chronic renal disease    Aranesp injection --  when Hg <11, last injection 12-24-14.  Marland Kitchen Anxiety   . Arthritis    knees and hand/fingers. "broke back"-being evaluated for this"weakness left leg"  . Cataract    both eyes done  . 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 CKD (chronic kidney disease), stage III ([place])  nephrologist--  dr Mercy Moore-- Tensed  07-09-2016  . Complication of anesthesia    post-op confusion   . Constipation   . Coronary artery disease   . Diabetic gastroparesis ([place])   . Diabetic retinopathy ([place])    bilateral --  monitored by dr [name]  . Diverticulosis of colon   . GAD (generalized anxiety disorder)   . GERD (gastroesophageal reflux disease)   . History of colon polyps    benign  . History of esophagitis   . History of GI bleed    upper  2009  due to esophagitis  &  2002  due to Mallory-Weiss tear  . History of hyperkalemia    pt had previously been canceled twice dos due to elevated K+, 07-29-2016 last date cancelled -- pt visited pcp same day treated w/ kayexelate and K+ came down, pt brought all her medication's in to pcp office and found pt was taking losartan that had been discontinued due to ckd, pcp stated this was cause of elevated K+  . 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 History of rectal abscess    12-29-2004  bedside I & D  . Hyperlipidemia   . Hypertension   . Hypothyroidism   . LAFB (left anterior fascicular block)   . Right bundle branch block   . Sacral decubitus ulcer    since 2014- 01-02-15 remains with wound" gauze dressing changes daily.  . Type II diabetes mellitus ([place])     Current Outpatient Medications:  .  ACCU-CHEK SOFTCLIX LANCETS lancets, Use as instructed 2 x daily  E11.65, Disp: 100 each, Rfl: 5 .  amLODipine (NORVASC) 5 MG tablet, TAKE 1 TABLET (5 MG TOTAL) BY MOUTH DAILY., Disp: 90 tablet, Rfl: 0 .  atorvastatin (LIPITOR) 10 MG tablet, Take 1 tablet (10 mg total) by mouth daily., Disp: 90 tablet, Rfl: 1 .  carvedilol (COREG) 6.25 MG tablet, Take 1 tablet (6.25 mg total) by mouth 2 (two) times daily with a meal., Disp: 180 tablet, Rfl: 1 .  cholecalciferol (VITAMIN D) 1000 units tablet, Take 1,000 Units by mouth daily., Disp: , Rfl:  .  colchicine 0.6 MG tablet, Take 0.6 mg by mouth daily as needed., Disp: , Rfl:  .  febuxostat (ULORIC) 40 MG tablet, Take 80 mg by mouth daily., Disp: , Rfl:  .  fluticasone (FLONASE) 50 MCG/ACT nasal spray, Use 2 sprays in each nostril daily, Disp: , Rfl:  .  furosemide (LASIX) 40 MG tablet, Take 40 mg by mouth 2 (two) times a week. , Disp: , Rfl:  .  glucose blood test strip, TEST 2 TIMES DAILY E11.65, Disp: 100 each, Rfl: 5 .  Insulin Glargine (BASAGLAR KWIKPEN) 100 UNIT/ML SOPN, Inject 0.1 mLs (10 Units total) into the skin daily.,  Disp: 15 mL, Rfl: 1 .  levothyroxine (SYNTHROID, LEVOTHROID) 75 MCG tablet, Take 1 tablet (75 mcg total) by mouth daily before breakfast., Disp: 90 tablet, Rfl: 1 .  pantoprazole (PROTONIX) 20 MG tablet, TAKE 1 TABLET EVERY DAY, Disp: 90 tablet, Rfl: 1 .  ULTICARE MICRO PEN NEEDLES 32G X 4 MM MISC, , Disp: , Rfl:   Assessment/ Plan: 72 y.o. female   1. Type 2 diabetes mellitus with stage 3 chronic kidney disease, with long-term current use of insulin ([place]) I reviewed her last note with Dr. Dorris Fetch.  Her A1c was 7.2.  She has had no significant hyper or hypoglycemic episodes.  Overall she is stable and not  requiring refills on her medications.  2. Hypertension associated with diabetes ([place]) Per her report normotensive.  I have sent refills of her medications to [facility] as per her request.  Plan for fasting lipid panel, CMP in a couple of months.  3. Hyperlipidemia associated with type 2 diabetes mellitus ([place]) Stable.  We will send in refills  4. CKD (chronic kidney disease), stage III [facility]) Following with nephrology in [place]  5. Hypothyroidism, atrophic Stable per her report.  Refills provided until she can have lab drawn.  Orders Placed This Encounter  Procedures  . CMP14+EGFR  . Thyroid Panel With TSH  . Bayer DCA Hb A1c Waived  . CBC   Meds ordered this encounter  Medications  . amLODipine (NORVASC) 5 MG tablet    Sig: Take 1 tablet (5 mg total) by mouth daily.    Dispense:  90 tablet    Refill:  0  . atorvastatin (LIPITOR) 10 MG tablet    Sig: Take 1 tablet (10 mg total) by mouth daily.    Dispense:  90 tablet    Refill:  1  . carvedilol (COREG) 6.25 MG tablet    Sig: Take 1 tablet (6.25 mg total) by mouth 2 (two) times daily with a meal.    Dispense:  180 tablet    Refill:  1  . pantoprazole (PROTONIX) 20 MG tablet    Sig: Take 1 tablet (20 mg total) by mouth daily.    Dispense:  90 tablet    Refill:  1  . levothyroxine (SYNTHROID, LEVOTHROID) 75 MCG tablet     Sig: Take 1 tablet (75 mcg total) by mouth daily before breakfast.    Dispense:  90 tablet    Refill:  0   Start time: 7:55am End time: 8:12am  Total time spent on patient care (including telephone call/ virtual visit): 19 minutes  [place], [place] [phone]

## 2018-09-05 ENCOUNTER — Other Ambulatory Visit: Payer: Self-pay | Admitting: Pharmacy Technician

## 2018-09-05 NOTE — Patient Outreach (Signed)
[place] [facility]) Care Management  09/05/2018  DESI CARBY [birth year] [id number]    Received all necessary documents and signatures from both patient and provider for [facility] application for [place].  Submitted completed application via fax to [employer].  Will followup with [facility] cares in 3-5 business days to inquire on status of application.  Kaetlin Bullen P. Leiby Pigeon, [place] Management [phone]

## 2018-09-07 ENCOUNTER — Other Ambulatory Visit: Payer: Self-pay | Admitting: Pharmacy Technician

## 2018-09-07 NOTE — Patient Outreach (Signed)
[place] [facility]) Care Management  09/07/2018  [name] [birth year] [id number]    Care coordination call placed to [place] in regards to patient's Basaglar application.  Spoke to [place] who informed patient is APPROVED 09/06/2018-05/24/2019. Caryl Pina informed the prescription is in the processing phase as it takes 1-2 business days for pharmacy to get the order and another 5-7 days to process the order. Caryl Pina informed checking back in 1-2 business days to inquire about shipping status.   Will followup with [facility] in 1-5 business days to inquire about shipping.  [name] P. [name], [place] Management [phone]

## 2018-09-08 ENCOUNTER — Other Ambulatory Visit: Payer: Self-pay | Admitting: Pharmacy Technician

## 2018-09-08 NOTE — Patient Outreach (Signed)
[place] [facility]) Care Management  09/08/2018  [name] 1946-02-28 [id number]   Care coordination call placed to [facility] in regards to patient's application for [name].   Spoke to [place] who informed that the order is ready for delivery. She phoned over to the pharmacy and they informed her that [facility] will be reaching out to patient to set up delivery.  Will outreach patient and or [name] in 3-5 business days to inquire about shipment.  Regino Fournet P. Uzziel Russey, [place] Management [phone]

## 2018-09-11 ENCOUNTER — Other Ambulatory Visit: Payer: Self-pay

## 2018-09-11 MED ORDER — GLUCOSE BLOOD VI STRP: ORAL_STRIP | 5 refills | Status: DC

## 2018-09-11 MED ORDER — ACCU-CHEK SOFTCLIX LANCETS MISC: 5 refills | Status: DC

## 2018-09-12 ENCOUNTER — Other Ambulatory Visit: Payer: Self-pay | Admitting: Pharmacy Technician

## 2018-09-12 NOTE — Patient Outreach (Signed)
[place] [facility]) Care Management  09/12/2018  [name] June 17, 1945 [id number]    Care coordination call placed to [facility] in regards to patient's Basaglar application.  Spoke to [employer] who informed that 4 boxes were delivered to the patient's home today. Tracking number is [id number].  Will followup with patient.  Marquice Uddin P. Karder Goodin, [place] Management [phone]

## 2018-09-12 NOTE — Patient Outreach (Signed)
[place] [facility]) Care Management  09/12/2018  [name] 02/27/1946 [id number]    ADDENDUM  Successful outreach call placed to patient in regards to [place] application for [employer].  Spoke to patient, HIPAA identifiers verified.  Patient informed she did receive her 4 boxes of Basaglar today. Discussed with patient how to obtain her refills by calling [facility] when she has approximately 2 week supply of medication remaining. Patient was able to locate the phone number on the label. Patient verbalized understanding. Confirmed with patient that she had my name and number if any issues were to arise concerning patient assistance. Patient informed she had no other questions or concerns at this time.  Will route note to [place] for case closure and will remove myself from care team as patient assistance has been completed.  Hurschel Paynter P. Kerriann Kamphuis, [place] Management [phone]

## 2018-09-13 ENCOUNTER — Telehealth: Payer: Self-pay | Admitting: Pharmacist

## 2018-09-13 NOTE — Patient Outreach (Addendum)
[place] [facility]) Care Management  09/13/2018  [name] 1945-06-21 [id number]   Patient's case is being closed because she received Basaglar from [employer] Patient Assistance program and is approved until the end of the year.  Patient communicated understanding about reordering her medication from [facility], C[facility].  Plan: Close case. Alert other [facility] Staff involved in the patient's care about the closure.   [name], PharmD, [place] Clinical Pharmacist [phone]

## 2018-09-14 ENCOUNTER — Ambulatory Visit: Payer: Self-pay | Admitting: Pharmacist

## 2018-09-19 ENCOUNTER — Ambulatory Visit: Payer: Medicare Other | Admitting: "Endocrinology

## 2018-09-27 ENCOUNTER — Other Ambulatory Visit: Payer: Self-pay

## 2018-09-27 NOTE — Patient Outreach (Signed)
[facility]) Care Management  09/27/2018   [name] 1945-08-08 [id number]  Subjective:  Successful call to the patient.  Two patient identifiers provided.  The patient states that she is doing well.  She denies any pain or falls.  She states that her FBS this morning was 102.  She states that she is taking her medications as prescribed.  She states that she is monitoring her diet and exercising daily. She verbalized appreciation to the [place] for helping with medication assistance with her insulin.  She states that she does not leave the home often but when she does she wears a mask.  She states that she has new physician Dr Lajuana Ripple.  She had a tele med appointment with her in April.  Current Medications:  Current Outpatient Medications  Medication Sig Dispense Refill  . Accu-Chek Softclix Lancets lancets Use as instructed 2 x daily  E11.65 100 each 5  . amLODipine (NORVASC) 5 MG tablet Take 1 tablet (5 mg total) by mouth daily. 90 tablet 0  . atorvastatin (LIPITOR) 10 MG tablet Take 1 tablet (10 mg total) by mouth daily. 90 tablet 1  . carvedilol (COREG) 6.25 MG tablet Take 1 tablet (6.25 mg total) by mouth 2 (two) times daily with a meal. 180 tablet 1  . cholecalciferol (VITAMIN D) 1000 units tablet Take 1,000 Units by mouth daily.    . colchicine 0.6 MG tablet Take 0.6 mg by mouth daily as needed.    . febuxostat (ULORIC) 40 MG tablet Take 80 mg by mouth daily.    . fluticasone (FLONASE) 50 MCG/ACT nasal spray Use 2 sprays in each nostril daily    . furosemide (LASIX) 40 MG tablet Take 40 mg by mouth 2 (two) times a week.     Marland Kitchen glucose blood test strip Use as instructed 2 x daily. E11.65 100 each 5  . Insulin Glargine (BASAGLAR KWIKPEN) 100 UNIT/ML SOPN Inject 0.1 mLs (10 Units total) into the skin daily. 15 mL 1  . levothyroxine (SYNTHROID, LEVOTHROID) 75 MCG tablet Take 1 tablet (75 mcg total) by mouth daily before breakfast. 90 tablet 0  . pantoprazole  (PROTONIX) 20 MG tablet Take 1 tablet (20 mg total) by mouth daily. 90 tablet 1  . ULTICARE MICRO PEN NEEDLES 32G X 4 MM MISC      No current facility-administered medications for this visit.     Functional Status:  No flowsheet data found.  Fall/Depression Screening: Fall Risk  09/27/2018 07/28/2018 04/28/2018  Falls in the past year? 0 0 0  Number falls in past yr: - - -  Injury with Fall? - - -  Comment - - -  Risk Factor Category  - - -  Risk for fall due to : - - -  Follow up - - -   PHQ 2/9 Scores 07/28/2018 03/01/2018 10/20/2017 06/28/2017 06/24/2017 06/24/2017 03/14/2017  PHQ - 2 Score 0 0 0 0 0 0 0  PHQ- 9 Score - - - - - - -    Assessment:  Patient will continue to benefit from health coach outreach for disease management and support. THN CM Care Plan Problem One     Most Recent Value  THN Long Term Goal   In 90 days the patient will verbalized that she has mantained her a1c level of 6.1  THN Long Term Goal Start Date  09/27/18  Interventions for Problem One Long Term Goal  Reviewed FBS, encouraged medication adherence, encouraged exercise  and discussed preventative measures for covid -19     Plan: [place] will contact patient in the month of August and patient agrees to next outreach.   [name] RN, BSN, [place] Direct Dial:  [phone]  Fax: [phone]

## 2018-09-29 ENCOUNTER — Telehealth: Payer: Self-pay | Admitting: Family Medicine

## 2018-09-29 NOTE — Telephone Encounter (Signed)
Rx placed on provider's desk for signature

## 2018-10-03 NOTE — Telephone Encounter (Signed)
Rx faxed to [facility]

## 2018-10-11 ENCOUNTER — Encounter: Payer: Self-pay | Admitting: Gastroenterology

## 2018-10-16 ENCOUNTER — Encounter: Payer: Self-pay | Admitting: Gastroenterology

## 2018-10-26 ENCOUNTER — Other Ambulatory Visit: Payer: Self-pay | Admitting: "Endocrinology

## 2018-10-26 DIAGNOSIS — B351 Tinea unguium: Secondary | ICD-10-CM | POA: Diagnosis not present

## 2018-10-26 DIAGNOSIS — L84 Corns and callosities: Secondary | ICD-10-CM | POA: Diagnosis not present

## 2018-10-26 DIAGNOSIS — M79676 Pain in unspecified toe(s): Secondary | ICD-10-CM | POA: Diagnosis not present

## 2018-10-26 DIAGNOSIS — E1151 Type 2 diabetes mellitus with diabetic peripheral angiopathy without gangrene: Secondary | ICD-10-CM | POA: Diagnosis not present

## 2018-10-30 ENCOUNTER — Telehealth: Payer: Self-pay | Admitting: "Endocrinology

## 2018-10-30 DIAGNOSIS — N183 Chronic kidney disease, stage 3 unspecified: Secondary | ICD-10-CM

## 2018-10-30 DIAGNOSIS — E039 Hypothyroidism, unspecified: Secondary | ICD-10-CM

## 2018-10-30 DIAGNOSIS — E1122 Type 2 diabetes mellitus with diabetic chronic kidney disease: Secondary | ICD-10-CM

## 2018-10-30 NOTE — Telephone Encounter (Signed)
Pt said she is getting her labs done at her PCP in [facility] on 6/26 and needs an updated order

## 2018-10-30 NOTE — Telephone Encounter (Signed)
Order updated

## 2018-11-02 ENCOUNTER — Other Ambulatory Visit: Payer: Self-pay

## 2018-11-02 ENCOUNTER — Ambulatory Visit (AMBULATORY_SURGERY_CENTER): Payer: Self-pay

## 2018-11-02 VITALS — Ht 62.0 in | Wt 145.0 lb

## 2018-11-02 DIAGNOSIS — Z86010 Personal history of colonic polyps: Secondary | ICD-10-CM

## 2018-11-02 MED ORDER — PEG 3350-KCL-NA BICARB-NACL 420 G PO SOLR: 4000.0000 mL | Freq: Once | ORAL | 0 refills | Status: AC

## 2018-11-02 NOTE — Progress Notes (Signed)
Denies allergies to eggs or soy products. Denies complication of anesthesia or sedation. Denies use of weight loss medication. Denies use of O2.   Emmi instructions given for colonoscopy.  Pre-Visit was conducted by phone due to Covid 19. Instructions were reviewed and mailed to patients confirmed home address. Patient was encouraged to call if she had questions or concerns regarding instructions.  

## 2018-11-15 ENCOUNTER — Telehealth: Payer: Self-pay | Admitting: Gastroenterology

## 2018-11-15 NOTE — Telephone Encounter (Signed)
Spoke w/patient regarding Covid-19 screening questions Covid-19 Screening Questions:  Do you now or have you had a fever in the last 14 days? no  Do you have any respiratory symptoms of shortness of breath or cough now or in the last 14 days? no  Do you have any family members or close contacts with diagnosed or suspected Covid-19 in the past 14 days? no  Have you been tested for Covid-19 and found to be positive? no  Pt made aware of that care partner may wait in the car or come up to the lobby during the procedure but will need to provide their own mask.

## 2018-11-16 ENCOUNTER — Other Ambulatory Visit: Payer: Self-pay

## 2018-11-16 ENCOUNTER — Encounter: Payer: Self-pay | Admitting: Gastroenterology

## 2018-11-16 ENCOUNTER — Ambulatory Visit (AMBULATORY_SURGERY_CENTER): Payer: Medicare Other | Admitting: Gastroenterology

## 2018-11-16 VITALS — BP 128/46 | HR 63 | Temp 98.8°F | Resp 14 | Ht 62.0 in | Wt 151.0 lb

## 2018-11-16 DIAGNOSIS — D122 Benign neoplasm of ascending colon: Secondary | ICD-10-CM

## 2018-11-16 DIAGNOSIS — D125 Benign neoplasm of sigmoid colon: Secondary | ICD-10-CM | POA: Diagnosis not present

## 2018-11-16 DIAGNOSIS — D120 Benign neoplasm of cecum: Secondary | ICD-10-CM | POA: Diagnosis not present

## 2018-11-16 DIAGNOSIS — Z860100 Personal history of colon polyps, unspecified: Secondary | ICD-10-CM

## 2018-11-16 DIAGNOSIS — E1122 Type 2 diabetes mellitus with diabetic chronic kidney disease: Secondary | ICD-10-CM | POA: Diagnosis not present

## 2018-11-16 DIAGNOSIS — K635 Polyp of colon: Secondary | ICD-10-CM | POA: Diagnosis not present

## 2018-11-16 DIAGNOSIS — N183 Chronic kidney disease, stage 3 (moderate): Secondary | ICD-10-CM | POA: Diagnosis not present

## 2018-11-16 DIAGNOSIS — I129 Hypertensive chronic kidney disease with stage 1 through stage 4 chronic kidney disease, or unspecified chronic kidney disease: Secondary | ICD-10-CM | POA: Diagnosis not present

## 2018-11-16 MED ORDER — SODIUM CHLORIDE 0.9 % IV SOLN: 500.0000 mL | Freq: Once | INTRAVENOUS | Status: DC

## 2018-11-16 NOTE — Op Note (Signed)
[place] Patient Name: [name] Procedure Date: 11/16/2018 7:14 AM MRN: [id number] Endoscopist: Remo Lipps P. Havery Moros , MD Age: 73 Referring MD:  Date of Birth: [birth year] Gender: Female Account #: [id number] Procedure:                Colonoscopy Indications:              Surveillance: History of numerous (> 10) adenomas                            on last colonoscopy (1 year ago) Medicines:                Monitored Anesthesia Care Procedure:                Pre-Anesthesia Assessment:                           - Prior to the procedure, a History and Physical                            was performed, and patient medications and                            allergies were reviewed. The patient's tolerance of                            previous anesthesia was also reviewed. The risks                            and benefits of the procedure and the sedation                            options and risks were discussed with the patient.                            All questions were answered, and informed consent                            was obtained. Prior Anticoagulants: The patient has                            taken no previous anticoagulant or antiplatelet                            agents. ASA Grade Assessment: III - A patient with                            severe systemic disease. After reviewing the risks                            and benefits, the patient was deemed in                            satisfactory condition to undergo the procedure.  After obtaining informed consent, the colonoscope                            was passed under direct vision. Throughout the                            procedure, the patient's blood pressure, pulse, and                            oxygen saturations were monitored continuously. The                            Colonoscope was introduced through the anus and                            advanced to the the  cecum, identified by                            appendiceal orifice and ileocecal valve. The                            colonoscopy was performed without difficulty. The                            patient tolerated the procedure well. The quality                            of the bowel preparation was adequate. The                            ileocecal valve, appendiceal orifice, and rectum                            were photographed. Scope In: 7:35:16 AM Scope Out: 7:54:55 AM Scope Withdrawal Time: 0 hours 13 minutes 43 seconds  Total Procedure Duration: 0 hours 19 minutes 39 seconds  Findings:                 The perianal and digital rectal examinations were                            normal.                           A diminutive polyp was found in the cecum. The                            polyp was sessile. The polyp was removed with a                            cold snare. Resection and retrieval were complete.                           A diminutive polyp was found in the ascending  colon. The polyp was sessile. The polyp was removed                            with a cold snare. Resection and retrieval were                            complete.                           A diminutive polyp was found in the sigmoid colon.                            The polyp was sessile. The polyp was removed with a                            cold snare. Resection and retrieval were complete.                           Multiple small-mouthed diverticula were found in                            the sigmoid colon, descending colon and ascending                            colon.                           Internal hemorrhoids were found during                            retroflexion. The hemorrhoids were small.                           The exam was otherwise without abnormality. Complications:            No immediate complications. Estimated blood loss:                             Minimal. Estimated Blood Loss:     Estimated blood loss was minimal. Impression:               - One diminutive polyp in the cecum, removed with a                            cold snare. Resected and retrieved.                           - One diminutive polyp in the ascending colon,                            removed with a cold snare. Resected and retrieved.                           - One diminutive polyp in the sigmoid colon,  removed with a cold snare. Resected and retrieved.                           - Diverticulosis in the sigmoid colon, in the                            descending colon and in the ascending colon.                           - Internal hemorrhoids.                           - The examination was otherwise normal. Recommendation:           - Patient has a contact number available for                            emergencies. The signs and symptoms of potential                            delayed complications were discussed with the                            patient. Return to normal activities tomorrow.                            Written discharge instructions were provided to the                            patient.                           - Resume previous diet.                           - Continue present medications.                           - Await pathology results. Remo Lipps P. Havery Moros, MD 11/16/2018 7:58:53 AM This report has been signed electronically.

## 2018-11-16 NOTE — Progress Notes (Signed)
Pt's states no medical or surgical changes since previsit or office visit. 

## 2018-11-16 NOTE — Progress Notes (Signed)
Called to room to assist during endoscopic procedure.  Patient ID and intended procedure confirmed with present staff. Received instructions for my participation in the procedure from the performing physician.  

## 2018-11-16 NOTE — Progress Notes (Signed)
Report to PACU, RN, vss, BBS= Clear.  

## 2018-11-16 NOTE — Patient Instructions (Addendum)
Thank you for allowing [place] to participate in your care today!  Await pathology results by mail, approximately 2 weeks.  Resume previous diet and medications today.  Return to your normal activities tomorrow.    YOU HAD AN ENDOSCOPIC PROCEDURE TODAY AT [facility]:   Refer to the procedure report that was given to you for any specific questions about what was found during the examination.  If the procedure report does not answer your questions, please call your gastroenterologist to clarify.  If you requested that your care partner not be given the details of your procedure findings, then the procedure report has been included in a sealed envelope for you to review at your convenience later.  YOU SHOULD EXPECT: Some feelings of bloating in the abdomen. Passage of more gas than usual.  Walking can help get rid of the air that was put into your GI tract during the procedure and reduce the bloating. If you had a lower endoscopy (such as a colonoscopy or flexible sigmoidoscopy) you may notice spotting of blood in your stool or on the toilet paper. If you underwent a bowel prep for your procedure, you may not have a normal bowel movement for a few days.  Please Note:  You might notice some irritation and congestion in your nose or some drainage.  This is from the oxygen used during your procedure.  There is no need for concern and it should clear up in a day or so.  SYMPTOMS TO REPORT IMMEDIATELY:   Following lower endoscopy (colonoscopy or flexible sigmoidoscopy):  Excessive amounts of blood in the stool  Significant tenderness or worsening of abdominal pains  Swelling of the abdomen that is new, acute  Fever of 100F or higher   For urgent or emergent issues, a gastroenterologist can be reached at any hour by calling [phone].   DIET:  We do recommend a small meal at first, but then you may proceed to your regular diet.  Drink plenty of fluids but you should avoid  alcoholic beverages for 24 hours.     ACTIVITY:  You should plan to take it easy for the rest of today and you should NOT DRIVE or use heavy machinery until tomorrow (because of the sedation medicines used during the test).    FOLLOW UP: Our staff will call the number listed on your records 48-72 hours following your procedure to check on you and address any questions or concerns that you may have regarding the information given to you following your procedure. If we do not reach you, we will leave a message.  We will attempt to reach you two times.  During this call, we will ask if you have developed any symptoms of COVID 19. If you develop any symptoms (ie: fever, flu-like symptoms, shortness of breath, cough etc.) before then, please call [phone].  If you test positive for Covid 19 in the 2 weeks post procedure, please call and report this information to [place].    If any biopsies were taken you will be contacted by phone or by letter within the next 1-3 weeks.  Please call us at [phone] if you have not heard about the biopsies in 3 weeks.    SIGNATURES/CONFIDENTIALITY: You and/or your care partner have signed paperwork which will be entered into your electronic medical record.  These signatures attest to the fact that that the information above on your After Visit Summary has been reviewed and is understood.  Full responsibility  of the confidentiality of this discharge information lies with you and/or your care-partner.

## 2018-11-17 ENCOUNTER — Ambulatory Visit: Payer: Medicare Other | Admitting: Family Medicine

## 2018-11-20 ENCOUNTER — Ambulatory Visit: Payer: Medicare Other | Admitting: Family Medicine

## 2018-11-20 ENCOUNTER — Telehealth: Payer: Self-pay

## 2018-11-20 ENCOUNTER — Other Ambulatory Visit: Payer: Self-pay

## 2018-11-20 ENCOUNTER — Telehealth: Payer: Self-pay | Admitting: Family Medicine

## 2018-11-20 ENCOUNTER — Telehealth: Payer: Self-pay | Admitting: *Deleted

## 2018-11-20 ENCOUNTER — Ambulatory Visit (INDEPENDENT_AMBULATORY_CARE_PROVIDER_SITE_OTHER): Payer: Medicare Other | Admitting: Family Medicine

## 2018-11-20 ENCOUNTER — Encounter: Payer: Self-pay | Admitting: Family Medicine

## 2018-11-20 VITALS — BP 150/72 | HR 66 | Temp 97.7°F | Ht 62.0 in | Wt 155.6 lb

## 2018-11-20 DIAGNOSIS — E785 Hyperlipidemia, unspecified: Secondary | ICD-10-CM | POA: Diagnosis not present

## 2018-11-20 DIAGNOSIS — I10 Essential (primary) hypertension: Secondary | ICD-10-CM | POA: Diagnosis not present

## 2018-11-20 DIAGNOSIS — E1169 Type 2 diabetes mellitus with other specified complication: Secondary | ICD-10-CM

## 2018-11-20 DIAGNOSIS — E1159 Type 2 diabetes mellitus with other circulatory complications: Secondary | ICD-10-CM | POA: Diagnosis not present

## 2018-11-20 DIAGNOSIS — E039 Hypothyroidism, unspecified: Secondary | ICD-10-CM | POA: Diagnosis not present

## 2018-11-20 DIAGNOSIS — N183 Chronic kidney disease, stage 3 unspecified: Secondary | ICD-10-CM

## 2018-11-20 DIAGNOSIS — E034 Atrophy of thyroid (acquired): Secondary | ICD-10-CM | POA: Diagnosis not present

## 2018-11-20 DIAGNOSIS — E1122 Type 2 diabetes mellitus with diabetic chronic kidney disease: Secondary | ICD-10-CM

## 2018-11-20 DIAGNOSIS — E113499 Type 2 diabetes mellitus with severe nonproliferative diabetic retinopathy without macular edema, unspecified eye: Secondary | ICD-10-CM

## 2018-11-20 DIAGNOSIS — Z794 Long term (current) use of insulin: Secondary | ICD-10-CM

## 2018-11-20 LAB — BAYER DCA HB A1C WAIVED: HB A1C (BAYER DCA - WAIVED): 7.7 % — ABNORMAL HIGH (ref <7.0)

## 2018-11-20 MED ORDER — PANTOPRAZOLE SODIUM 20 MG PO TBEC: 20.0000 mg | DELAYED_RELEASE_TABLET | Freq: Every day | ORAL | 1 refills | Status: DC

## 2018-11-20 MED ORDER — FUROSEMIDE 40 MG PO TABS: 40.0000 mg | ORAL_TABLET | ORAL | 3 refills | Status: DC

## 2018-11-20 MED ORDER — LEVOTHYROXINE SODIUM 75 MCG PO TABS: 75.0000 ug | ORAL_TABLET | Freq: Every day | ORAL | 1 refills | Status: DC

## 2018-11-20 MED ORDER — CARVEDILOL 6.25 MG PO TABS: 6.2500 mg | ORAL_TABLET | Freq: Two times a day (BID) | ORAL | 1 refills | Status: DC

## 2018-11-20 MED ORDER — ATORVASTATIN CALCIUM 10 MG PO TABS: 10.0000 mg | ORAL_TABLET | Freq: Every day | ORAL | 1 refills | Status: DC

## 2018-11-20 MED ORDER — AMLODIPINE BESYLATE 5 MG PO TABS: 5.0000 mg | ORAL_TABLET | Freq: Every day | ORAL | 1 refills | Status: DC

## 2018-11-20 NOTE — Telephone Encounter (Signed)
First attempt call back after procedure, instruct pt to call if any problems eating or drinking, fever or covid exposure/diagnosis.

## 2018-11-20 NOTE — Progress Notes (Signed)
Subjective: CC: f/u DM, HTN, HLD PCP: [name], DO VCB:[facility] H Erno is a 73 y.o. female presenting to clinic today for:  1. Type 2 Diabetes with hypertension, hyperlipidemia, DM retinopathy and CKD 4:  Patient reports that she has been misbehaving some with diet since be home due to [place]. Taking medication(s): 10 units of Basaglar every morning, Lipitor 10 mg, Coreg 6.25 mg p.o. twice daily, Norvasc 5 mg daily, Side effects: None.  She sees Dr. Dorris Fetch with follow-up in about 2 weeks.  She like to have her labs sent.  Last eye exam: December 2019, sees Dr. Radene Ou.  Follow-up in August Last foot exam: needs Last A1c:  Lab Results  Component Value Date   HGBA1C 7.2 (H) 03/01/2018   Nephropathy screen indicated?: yes, has allergy to ARB/ ACE-I.  She has known chronic kidney disease and is followed by renal in [place].  She has upcoming appointment with him soon. Last flu, zoster and/or pneumovax:  Immunization History  Administered Date(s) Administered  . Influenza, High Dose Seasonal PF 03/05/2016, 03/14/2017, 03/01/2018  . Influenza,inj,Quad PF,6+ Mos 02/16/2013, 02/27/2015  . Pneumococcal Conjugate-13 10/11/2014  . Pneumococcal Polysaccharide-23 10/10/2012  . Tdap 10/06/2012   ROS: Denies any chest pain, shortness of breath, dizziness, loss of consciousness.  She is had some weight gain.  2.  Hypothyroidism, atrophic Patient reports compliance with Synthroid 75 mcg daily.  No change in voice, difficulty swallowing, heart palpitations, change in bowel movements.  She reports weight gain as above.   ROS: Per HPI  Allergies  Allergen Reactions  . Aspirin Nausea And Vomiting  . Ciprofloxacin Other (See Comments)    Upset Stomach  . Codeine Nausea And Vomiting    Makes me sick   . Losartan     Hyperkalemia   . Micardis [Telmisartan] Other (See Comments)    unknown  . Nexium [Esomeprazole Magnesium] Other (See Comments)    Causes internal bleeding  .  Niaspan [Niacin Er] Other (See Comments)    Reaction is unknown  . Onglyza [Saxagliptin] Other (See Comments)    Reaction is unknown  . Other     No otc pain medications  . Rofecoxib Other (See Comments)    unknown  . Simvastatin   . Tequin [Gatifloxacin] Other (See Comments)    Reaction is unknown  . Welchol [Colesevelam Hcl]   . Amoxicillin Nausea And Vomiting and Rash    Has patient had a PCN reaction causing immediate rash, facial/tongue/throat swelling, SOB or lightheadedness with hypotension: Yes Has patient had a PCN reaction causing severe rash involving mucus membranes or skin necrosis: no  Has patient had a PCN reaction that required hospitalization No Has patient had a PCN reaction occurring within the last 10 years: No If all of the above answers are "NO", then may proceed with Cephalosporin use.    Past Medical History:  Diagnosis Date  . Anal fistula   . Anemia in chronic renal disease    Aranesp injection --  when Hg <11, last injection 12-24-14.  Marland Kitchen Anxiety   . Arthritis    knees and hand/fingers. "broke back"-being evaluated for this"weakness left leg"  . Cataract    both eyes done  . 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 CKD (chronic kidney disease), stage III [facility])    nephrologist--  dr Mercy Moore-- [place]  07-09-2016  . Complication of anesthesia    post-op confusion   . Constipation   .  Coronary artery disease   . Diabetic gastroparesis ([place])   . Diabetic retinopathy ([place])    bilateral --  monitored by dr [name]  . Diverticulosis of colon   . GAD (generalized anxiety disorder)   . GERD (gastroesophageal reflux disease)   . History of colon polyps    benign  . History of esophagitis   . History of GI bleed    upper 2009  due to esophagitis  &  2002  due to Mallory-Weiss tear  . History of hyperkalemia    pt had previously been canceled twice dos due to elevated K+, 07-29-2016 last date cancelled -- pt visited pcp same day  treated w/ kayexelate and K+ came down, pt brought all her medication's in to pcp office and found pt was taking losartan that had been discontinued due to ckd, pcp stated this was cause of elevated K+  . 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 History of rectal abscess    12-29-2004  bedside I & D  . Hyperlipidemia   . Hypertension   . Hypothyroidism   . LAFB (left anterior fascicular block)   . Right bundle branch block   . Sacral decubitus ulcer    since 2014- 01-02-15 remains with wound" gauze dressing changes daily.  . Type II diabetes mellitus ([place])     Current Outpatient Medications:  .  Accu-Chek Softclix Lancets lancets, Use as instructed 2 x daily  E11.65, Disp: 100 each, Rfl: 5 .  amLODipine (NORVASC) 5 MG tablet, Take 1 tablet (5 mg total) by mouth daily., Disp: 90 tablet, Rfl: 0 .  atorvastatin (LIPITOR) 10 MG tablet, Take 1 tablet (10 mg total) by mouth daily., Disp: 90 tablet, Rfl: 1 .  carvedilol (COREG) 6.25 MG tablet, Take 1 tablet (6.25 mg total) by mouth 2 (two) times daily with a meal., Disp: 180 tablet, Rfl: 1 .  cholecalciferol (VITAMIN D) 1000 units tablet, Take 1,000 Units by mouth daily., Disp: , Rfl:  .  colchicine 0.6 MG tablet, Take 0.6 mg by mouth daily as needed., Disp: , Rfl:  .  febuxostat (ULORIC) 40 MG tablet, Take 80 mg by mouth daily., Disp: , Rfl:  .  fluticasone (FLONASE) 50 MCG/ACT nasal spray, Use 2 sprays in each nostril daily, Disp: , Rfl:  .  furosemide (LASIX) 40 MG tablet, Take 40 mg by mouth 2 (two) times a week. , Disp: , Rfl:  .  glucose blood test strip, Use as instructed 2 x daily. E11.65, Disp: 100 each, Rfl: 5 .  Insulin Glargine (BASAGLAR KWIKPEN) 100 UNIT/ML SOPN, Inject 0.1 mLs (10 Units total) into the skin daily., Disp: 15 mL, Rfl: 1 .  levothyroxine (SYNTHROID, LEVOTHROID) 75 MCG tablet, Take 1 tablet (75 mcg total) by mouth daily before breakfast., Disp: 90 tablet, Rfl: 0 .  pantoprazole (PROTONIX) 20 MG  tablet, Take 1 tablet (20 mg total) by mouth daily., Disp: 90 tablet, Rfl: 1 .  ULTICARE MICRO PEN NEEDLES 32G X 4 MM MISC, TWICE DAILY, Disp: 100 each, Rfl: 5 Social History   Socioeconomic History  . Marital status: Married    Spouse name: [name]  . Number of children: 0  . Years of education: 16  . Highest education level: Not on file  Occupational History  . Occupation: Retired    Fish farm manager: RETIRED  Social Needs  . Financial resource strain: Not on file  . Food insecurity    Worry: Not on file  Inability: Not on file  . Transportation needs    Medical: Not on file    Non-medical: Not on file  Tobacco Use  . Smoking status: Never Smoker  . Smokeless tobacco: Never Used  Substance and Sexual Activity  . Alcohol use: No  . Drug use: No  . Sexual activity: Not Currently  Lifestyle  . Physical activity    Days per week: Not on file    Minutes per session: Not on file  . Stress: Not on file  Relationships  . Social Herbalist on phone: Not on file    Gets together: Not on file    Attends religious service: Not on file    Active member of club or organization: Not on file    Attends meetings of clubs or organizations: Not on file    Relationship status: Not on file  . Intimate partner violence    Fear of current or ex partner: Not on file    Emotionally abused: Not on file    Physically abused: Not on file    Forced sexual activity: Not on file  Other Topics Concern  . Not on file  Social History Narrative   Lives with husband.    Caffeine use: none   Family History  Problem Relation Age of Onset  . Colon cancer Father 6  . Prostate cancer Father   . Diabetes Father   . Coronary artery disease Mother 55  . Heart disease Mother   . Diabetes Mother   . Coronary artery disease Sister 25  . Diabetes Sister   . Heart disease Sister   . Diabetes Sister   . Diabetes Sister   . Diabetes Sister   . Diabetes Sister   . Diabetes Sister   . Diabetes  Maternal Grandmother   . Esophageal cancer Neg Hx   . Stomach cancer Neg Hx   . Rectal cancer Neg Hx     Objective: Office vital signs reviewed. BP (!) 150/72   Pulse 66   Temp 97.7 F (36.5 C) (Oral)   Ht 5\' 2"  (1.575 m)   Wt 155 lb 9.6 oz (70.6 kg)   BMI 28.46 kg/m   Physical Examination:  General: Awake, alert, chronically ill appearing female, No acute distress HEENT: Normal    Neck: No masses palpated. No lymphadenopathy    Eyes: PERRLA, extraocular membranes intact, sclera white.  No exophthalmos Cardio: regular rate and rhythm, S1S2 heard, no murmurs appreciated Pulm: clear to auscultation bilaterally, no wheezes, rhonchi or rales; normal work of breathing on room air Extremities: warm, well perfused, No edema, cyanosis or clubbing; +2 pulses bilaterally Skin: Normal temperature Neuro: No resting tremor  Diabetic Foot Exam - Simple   Simple Foot Form Diabetic Foot exam was performed with the following findings: Yes 11/20/2018  9:34 AM  Visual Inspection See comments: Yes Sensation Testing Intact to touch and monofilament testing bilaterally: Yes Pulse Check Posterior Tibialis and Dorsalis pulse intact bilaterally: Yes Comments She has fibular deviation of toes on left foot.  Per patient this is due to broken foot in the past.  Third toe on the right foot also noted to be deformed.  She has light touch sensation grossly intact as well as monofilament sensation.  No ulcers or skin breakdown appreciated.  +1 pedal pulses bilaterally.    No results found for this or any previous visit (from the past 24 hour(s)).  Assessment/ Plan: 73 y.o. female   1. Type  2 diabetes mellitus with stage 3 chronic kidney disease, with long-term current use of insulin (HCC) A1c 7.7 today.  I will forward the rest of her labs to her endocrinologist for ongoing follow-up.  Medication management per their expertise. - Bayer DCA Hb A1c Waived - Microalbumin / creatinine urine ratio  2.  Hyperlipidemia associated with type 2 diabetes mellitus (HCC) Continue statin.  Refill sent - atorvastatin (LIPITOR) 10 MG tablet; Take 1 tablet (10 mg total) by mouth daily.  Dispense: 90 tablet; Refill: 1  3. Hypertension associated with diabetes ([place]) Blood pressure not well controlled.  Will await follow-up visit before making any medication changes since this is our first in person visit.  She is to continue monitoring blood pressures at home as well - Basic Metabolic Panel - amLODipine (NORVASC) 5 MG tablet; Take 1 tablet (5 mg total) by mouth daily.  Dispense: 90 tablet; Refill: 1 - carvedilol (COREG) 6.25 MG tablet; Take 1 tablet (6.25 mg total) by mouth 2 (two) times daily with a meal.  Dispense: 180 tablet; Refill: 1  4. 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 [name].  5. Hypothyroidism due to acquired atrophy of thyroid Asymptomatic.  Check thyroid panel.  Will forward to endocrinologist once resulted - levothyroxine (SYNTHROID) 75 MCG tablet; Take 1 tablet (75 mcg total) by mouth daily before breakfast.  Dispense: 90 tablet; Refill: 1   Orders Placed This Encounter  Procedures  . Bayer DCA Hb A1c Waived  . Thyroid Panel With TSH  . Basic Metabolic Panel  . Microalbumin / creatinine urine ratio   Meds ordered this encounter  Medications  . atorvastatin (LIPITOR) 10 MG tablet    Sig: Take 1 tablet (10 mg total) by mouth daily.    Dispense:  90 tablet    Refill:  1  . amLODipine (NORVASC) 5 MG tablet    Sig: Take 1 tablet (5 mg total) by mouth daily.    Dispense:  90 tablet    Refill:  1  . carvedilol (COREG) 6.25 MG tablet    Sig: Take 1 tablet (6.25 mg total) by mouth 2 (two) times daily with a meal.    Dispense:  180 tablet    Refill:  1  . levothyroxine (SYNTHROID) 75 MCG tablet    Sig: Take 1 tablet (75 mcg total) by mouth daily before  breakfast.    Dispense:  90 tablet    Refill:  1  . pantoprazole (PROTONIX) 20 MG tablet    Sig: Take 1 tablet (20 mg total) by mouth daily.    Dispense:  90 tablet    Refill:  1  . furosemide (LASIX) 40 MG tablet    Sig: Take 1 tablet (40 mg total) by mouth 2 (two) times a week.    Dispense:  30 tablet    Refill:  [place], [place] [phone]

## 2018-11-20 NOTE — Telephone Encounter (Signed)
Patient aware that we did not refill her insuliin.

## 2018-11-20 NOTE — Patient Instructions (Signed)

## 2018-11-21 LAB — BASIC METABOLIC PANEL
BUN/Creatinine Ratio: 22 (ref 12–28)
BUN: 32 mg/dL — ABNORMAL HIGH (ref 8–27)
CO2: 20 mmol/L (ref 20–29)
Calcium: 9.2 mg/dL (ref 8.7–10.3)
Chloride: 103 mmol/L (ref 96–106)
Creatinine, Ser: 1.44 mg/dL — ABNORMAL HIGH (ref 0.57–1.00)
GFR calc Af Amer: 42 mL/min/{1.73_m2} — ABNORMAL LOW (ref >59)
GFR calc non Af Amer: 36 mL/min/{1.73_m2} — ABNORMAL LOW (ref >59)
Glucose: 163 mg/dL — ABNORMAL HIGH (ref 65–99)
Potassium: 5.1 mmol/L (ref 3.5–5.2)
Sodium: 138 mmol/L (ref 134–144)

## 2018-11-21 LAB — THYROID PANEL WITH TSH
Free Thyroxine Index: 2.9 (ref 1.2–4.9)
T3 Uptake Ratio: 30 % (ref 24–39)
T4, Total: 9.6 ug/dL (ref 4.5–12.0)
TSH: 0.905 u[IU]/mL (ref 0.450–4.500)

## 2018-11-21 NOTE — Telephone Encounter (Signed)
Follow up call attempt.  No answer.

## 2018-11-22 ENCOUNTER — Encounter: Payer: Self-pay | Admitting: Gastroenterology

## 2018-11-27 ENCOUNTER — Ambulatory Visit: Payer: Medicare Other | Admitting: "Endocrinology

## 2018-12-11 ENCOUNTER — Other Ambulatory Visit: Payer: Self-pay

## 2018-12-11 ENCOUNTER — Ambulatory Visit (INDEPENDENT_AMBULATORY_CARE_PROVIDER_SITE_OTHER): Payer: Medicare Other | Admitting: "Endocrinology

## 2018-12-11 ENCOUNTER — Encounter: Payer: Self-pay | Admitting: "Endocrinology

## 2018-12-11 DIAGNOSIS — E1122 Type 2 diabetes mellitus with diabetic chronic kidney disease: Secondary | ICD-10-CM

## 2018-12-11 DIAGNOSIS — I10 Essential (primary) hypertension: Secondary | ICD-10-CM

## 2018-12-11 DIAGNOSIS — N183 Chronic kidney disease, stage 3 unspecified: Secondary | ICD-10-CM

## 2018-12-11 DIAGNOSIS — E039 Hypothyroidism, unspecified: Secondary | ICD-10-CM | POA: Diagnosis not present

## 2018-12-11 DIAGNOSIS — E782 Mixed hyperlipidemia: Secondary | ICD-10-CM

## 2018-12-11 DIAGNOSIS — Z794 Long term (current) use of insulin: Secondary | ICD-10-CM

## 2018-12-11 NOTE — Progress Notes (Signed)
12/11/2018                                                    Endocrinology Telehealth Visit Follow up Note -During COVID -19 Pandemic  This visit type was conducted due to national recommendations for restrictions regarding the COVID-19 Pandemic  in an effort to limit this patient's exposure and mitigate transmission of the corona virus.  Due to her co-morbid illnesses, [name] is at  moderate to high risk for complications without adequate follow up.  This format is felt to be most appropriate for her at this time.  I connected with this patient on 12/11/2018   by telephone and verified that I am speaking with the correct person using two identifiers. [name], Aug 18, 1945. she has verbally consented to this visit. All issues noted in this document were discussed and addressed. The format was not optimal for physical exam.    Subjective:    Patient ID: [name], female    DOB: 10/12/1945.  She is being engaged in Telehealth for follow up of  uncontrolled type 2 diabetes, hypothyroidism, hypertension, hyperlipidemia.  Past Medical History:  Diagnosis Date  . Anal fistula   . Anemia in chronic renal disease    Aranesp injection --  when Hg <11, last injection 12-24-14.  Marland Kitchen Anxiety   . Arthritis    knees and hand/fingers. "broke back"-being evaluated for this"weakness left leg"  . Cataract    both eyes done  . 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 CKD (chronic kidney disease), stage III [facility])    nephrologist--  dr Mercy Moore-- [place]  07-09-2016  . Complication of anesthesia    post-op confusion   . Constipation   . Coronary artery disease   . Diabetic gastroparesis ([place])   . Diabetic retinopathy ([place])    bilateral --  monitored by dr [name]  . Diverticulosis of colon   . GAD (generalized anxiety disorder)   . GERD (gastroesophageal reflux disease)   . History of colon polyps    benign  . History of esophagitis   . History of GI bleed     upper 2009  due to esophagitis  &  2002  due to Mallory-Weiss tear  . History of hyperkalemia    pt had previously been canceled twice dos due to elevated K+, 07-29-2016 last date cancelled -- pt visited pcp same day treated w/ kayexelate and K+ came down, pt brought all her medication's in to pcp office and found pt was taking losartan that had been discontinued due to ckd, pcp stated this was cause of elevated K+  . 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 History of rectal abscess    12-29-2004  bedside I & D  . Hyperlipidemia   . Hypertension   . Hypothyroidism   . LAFB (left anterior fascicular block)   . Right bundle branch block   . Sacral decubitus ulcer    since 2014- 01-02-15 remains with wound" gauze dressing changes daily.  . Type II diabetes mellitus ([place])    Past Surgical History:  Procedure Laterality Date  . APPLICATION OF A-CELL OF EXTREMITY N/A 04/07/2015   Procedure: A CELL PLACMENT;  Surgeon: Loel Lofty Dillingham, DO;  Location: [place];  Service: Clinical cytogeneticist;  Laterality: N/A;  . CARDIOVASCULAR STRESS TEST  12-30-2004   normal perfusion study/  no ischemia or infartion/  normal LV wall function and wall motion , ef 66%  . CATARACT EXTRACTION W/ INTRAOCULAR LENS  IMPLANT, BILATERAL  1995  . COLONOSCOPY  2008   w/Dr.Brodie   . COLONOSCOPY W/ POLYPECTOMY  last one 2008  . COMPRESSION HIP SCREW Right 05/18/2014   Procedure: COMPRESSION HIP;  Surgeon: Carole Civil, MD;  Location: [facility];  Service: Orthopedics;  Laterality: Right;  . ESOPHAGOGASTRODUODENOSCOPY  last one 01-09-2011  . EVALUATION UNDER ANESTHESIA WITH FISTULECTOMY N/A 01/06/2015   Procedure: EXAM UNDER ANESTHESIA , placement of seton;  Surgeon: Jackolyn Confer, MD;  Location: [facility];  Service: General;  Laterality: N/A;  . I&D EXTREMITY N/A 04/07/2015   Procedure: IRRIGATION AND DEBRIDEMENT ISCHIAL ULCER;  Surgeon: Loel Lofty Dillingham, DO;  Location: [place];  Service: Plastics;   Laterality: N/A;  . INCISION AND DRAINAGE OF WOUND N/A 09/30/2014   Procedure: IRRIGATION AND DEBRIDEMENT SACRAL WOUND, EXCISION OF PERIRECTAL TRACT WITH PLACEMENT OF ACCELL;  Surgeon: Theodoro Kos, DO;  Location: [place];  Service: Plastics;  Laterality: N/A;  . LIGATION OF INTERNAL FISTULA TRACT N/A 10/22/2016   Procedure: LIGATION OF INTERNAL FISTULA TRACT;  Surgeon: Leighton Ruff, MD;  Location: [facility];  Service: General;  Laterality: N/A;  . ORIF FEMUR FRACTURE Left 10/09/2012   Procedure: OPEN REDUCTION INTERNAL FIXATION (ORIF) DISTAL FEMUR FRACTURE;  Surgeon: Rozanna Box, MD;  Location: [place];  Service: Orthopedics;  Laterality: Left;  . RETINOPATHY SURGERY Bilateral 1980's?  . TRANSTHORACIC ECHOCARDIOGRAM  02-18-2011   mild LVH,  ef 55-60%,  grade I diastolic dysfunction/  mild TR/  RV systolic pressure increased consistant with moderate pulmonary hypertension   Social History   Socioeconomic History  . Marital status: Married    Spouse name: [name]  . Number of children: 0  . Years of education: 38  . Highest education level: Not on file  Occupational History  . Occupation: Retired    Fish farm manager: RETIRED  Social Needs  . Financial resource strain: Not on file  . Food insecurity    Worry: Not on file    Inability: Not on file  . Transportation needs    Medical: Not on file    Non-medical: Not on file  Tobacco Use  . Smoking status: Never Smoker  . Smokeless tobacco: Never Used  Substance and Sexual Activity  . Alcohol use: No  . Drug use: No  . Sexual activity: Not Currently  Lifestyle  . Physical activity    Days per week: Not on file    Minutes per session: Not on file  . Stress: Not on file  Relationships  . Social Herbalist on phone: Not on file    Gets together: Not on file    Attends religious service: Not on file    Active member of club or organization: Not on file    Attends meetings of clubs or  organizations: Not on file    Relationship status: Not on file  Other Topics Concern  . Not on file  Social History Narrative   Lives with husband.    Caffeine use: none   Outpatient Encounter Medications as of 12/11/2018  Medication Sig  . Accu-Chek Softclix Lancets lancets Use as instructed 2 x daily  E11.65  . amLODipine (NORVASC) 5 MG tablet Take 1 tablet (5 mg  total) by mouth daily.  Marland Kitchen atorvastatin (LIPITOR) 10 MG tablet Take 1 tablet (10 mg total) by mouth daily.  . carvedilol (COREG) 6.25 MG tablet Take 1 tablet (6.25 mg total) by mouth 2 (two) times daily with a meal.  . cholecalciferol (VITAMIN D) 1000 units tablet Take 1,000 Units by mouth daily.  . colchicine 0.6 MG tablet Take 0.6 mg by mouth daily as needed.  . febuxostat (ULORIC) 40 MG tablet Take 80 mg by mouth daily.  . fluticasone (FLONASE) 50 MCG/ACT nasal spray Use 2 sprays in each nostril daily  . furosemide (LASIX) 40 MG tablet Take 1 tablet (40 mg total) by mouth 2 (two) times a week.  Marland Kitchen glucose blood test strip Use as instructed 2 x daily. E11.65  . Insulin Glargine (BASAGLAR KWIKPEN) 100 UNIT/ML SOPN Inject 0.1 mLs (10 Units total) into the skin daily.  Marland Kitchen levothyroxine (SYNTHROID) 75 MCG tablet Take 1 tablet (75 mcg total) by mouth daily before breakfast.  . pantoprazole (PROTONIX) 20 MG tablet Take 1 tablet (20 mg total) by mouth daily.  Marland Kitchen ULTICARE MICRO PEN NEEDLES 32G X 4 MM MISC TWICE DAILY   No facility-administered encounter medications on file as of 12/11/2018.    ALLERGIES: Allergies  Allergen Reactions  . Aspirin Nausea And Vomiting  . Ciprofloxacin Other (See Comments)    Upset Stomach  . Codeine Nausea And Vomiting    Makes me sick   . Losartan     Hyperkalemia   . Micardis [Telmisartan] Other (See Comments)    unknown  . Nexium [Esomeprazole Magnesium] Other (See Comments)    Causes internal bleeding  . Niaspan [Niacin Er] Other (See Comments)    Reaction is unknown  . Onglyza  [Saxagliptin] Other (See Comments)    Reaction is unknown  . Other     No otc pain medications  . Rofecoxib Other (See Comments)    unknown  . Simvastatin   . Tequin [Gatifloxacin] Other (See Comments)    Reaction is unknown  . Welchol [Colesevelam Hcl]   . Amoxicillin Nausea And Vomiting and Rash    Has patient had a PCN reaction causing immediate rash, facial/tongue/throat swelling, SOB or lightheadedness with hypotension: Yes Has patient had a PCN reaction causing severe rash involving mucus membranes or skin necrosis: no  Has patient had a PCN reaction that required hospitalization No Has patient had a PCN reaction occurring within the last 10 years: No If all of the above answers are "NO", then may proceed with Cephalosporin use.    VACCINATION STATUS: Immunization History  Administered Date(s) Administered  . Influenza, High Dose Seasonal PF 03/05/2016, 03/14/2017, 03/01/2018  . Influenza,inj,Quad PF,6+ Mos 02/16/2013, 02/27/2015  . Pneumococcal Conjugate-13 10/11/2014  . Pneumococcal Polysaccharide-23 10/10/2012  . Tdap 10/06/2012    Diabetes She presents for her follow-up diabetic visit. She has type 2 diabetes mellitus. Onset time: She was diagnosed at approximate age of 55 years . Her disease course has been stable. There are no hypoglycemic associated symptoms. Pertinent negatives for hypoglycemia include no confusion, headaches, pallor or seizures. Associated symptoms include visual change. Pertinent negatives for diabetes include no chest pain, no fatigue, no polydipsia and no polyphagia. There are no hypoglycemic complications. Symptoms are stable. Diabetic complications include nephropathy, peripheral neuropathy, PVD and retinopathy. Risk factors for coronary artery disease include diabetes mellitus, dyslipidemia, hypertension and sedentary lifestyle. Current diabetic treatment includes oral agent (monotherapy). She is compliant with treatment most of the time. Her weight  is stable.  She is following a generally unhealthy diet. When asked about meal planning, she reported none. She has not had a previous visit with a dietitian. She never participates in exercise. Her home blood glucose trend is decreasing steadily. Her breakfast blood glucose range is generally 130-140 mg/dl. Her bedtime blood glucose range is generally 130-140 mg/dl. Her overall blood glucose range is 130-140 mg/dl. An ACE inhibitor/angiotensin II receptor blocker is being taken. Eye exam is current.  Thyroid Problem Presents for initial visit. Onset time: 15 years. Symptoms include visual change. Patient reports no cold intolerance, diarrhea, fatigue, heat intolerance or palpitations. The symptoms have been stable. Past treatments include levothyroxine. The following procedures have not been performed: thyroidectomy.  Hypertension This is a chronic problem. The current episode started more than 1 year ago. Pertinent negatives include no chest pain, headaches, palpitations or shortness of breath. Past treatments include angiotensin blockers. Hypertensive end-organ damage includes PVD and retinopathy. Identifiable causes of hypertension include a thyroid problem.    Review of Systems  Constitutional: Negative for fatigue and unexpected weight change.  HENT: Negative for trouble swallowing and voice change.   Eyes: Negative for visual disturbance.  Respiratory: Negative for cough, shortness of breath and wheezing.   Cardiovascular: Negative for chest pain, palpitations and leg swelling.  Gastrointestinal: Negative for diarrhea, nausea and vomiting.  Endocrine: Negative for cold intolerance, heat intolerance, polydipsia and polyphagia.  Musculoskeletal: Positive for arthralgias, back pain, gait problem, joint swelling and myalgias.  Skin: Negative for color change, pallor, rash and wound.  Neurological: Negative for seizures and headaches.  Psychiatric/Behavioral: Negative for confusion and suicidal  ideas.    Objective:    There were no vitals taken for this visit.  Wt Readings from Last 3 Encounters:  11/20/18 155 lb 9.6 oz (70.6 kg)  11/16/18 151 lb (68.5 kg)  11/02/18 145 lb (65.8 kg)      Results for orders placed or performed in visit on 11/20/18  Bayer DCA Hb A1c Waived  Result Value Ref Range   HB A1C (BAYER DCA - WAIVED) 7.7 (H) <7.0 %  Thyroid Panel With TSH  Result Value Ref Range   TSH 0.905 0.450 - 4.500 uIU/mL   T4, Total 9.6 4.5 - 12.0 ug/dL   T3 Uptake Ratio 30 24 - 39 %   Free Thyroxine Index 2.9 1.2 - 4.9  Basic Metabolic Panel  Result Value Ref Range   Glucose 163 (H) 65 - 99 mg/dL   BUN 32 (H) 8 - 27 mg/dL   Creatinine, Ser 1.44 (H) 0.57 - 1.00 mg/dL   GFR calc non Af Amer 36 (L) >59 mL/min/1.73   GFR calc Af Amer 42 (L) >59 mL/min/1.73   BUN/Creatinine Ratio 22 12 - 28   Sodium 138 134 - 144 mmol/L   Potassium 5.1 3.5 - 5.2 mmol/L   Chloride 103 96 - 106 mmol/L   CO2 20 20 - 29 mmol/L   Calcium 9.2 8.7 - 10.3 mg/dL   Diabetic Labs (most recent): Lab Results  Component Value Date   HGBA1C 7.7 (H) 11/20/2018   HGBA1C 7.2 (H) 03/01/2018   HGBA1C 7.2 03/01/2018   Lipid Panel     Component Value Date/Time   CHOL 160 03/14/2017 0824   CHOL 105 09/11/2012 1057   TRIG 170 (H) 03/14/2017 0824   TRIG 170 (H) 10/11/2014 1159   TRIG 79 09/11/2012 1057   HDL 37 (L) 03/14/2017 0824   HDL 53 10/11/2014 1159   HDL 41 09/11/2012  1057   CHOLHDL 4.3 03/14/2017 0824   LDLCALC 89 03/14/2017 0824   LDLCALC 50 02/14/2014 1012   LDLCALC 48 09/11/2012 1057     Assessment & Plan:   1. Type 2 diabetes mellitus with stage 4 chronic kidney disease, without long-term current use of insulin ([place])  - Patient has currently controlled asymptomatic type 2 DM since  73 years of age. -She reports near target glycemic profile fasting ranging between 93 and 148, postprandial ranging from 143-200.  Her previsit labs show a1c stable at 7.7%, slowly improving from  10%.  -her  diabetes is complicated by PAD, CKD, neuropathy and patient remains at a high risk for more acute and chronic complications of diabetes which include CAD, CVA, CKD, retinopathy, and neuropathy. These are all discussed in detail with the patient.  - I have counseled the patient on diet management  by adopting a carbohydrate restricted/protein rich diet.  - she  admits there is a room for improvement in her diet and drink choices. -  Suggestion is made for her to avoid simple carbohydrates  from her diet including Cakes, Sweet Desserts / Pastries, Ice Cream, Soda (diet and regular), Sweet Tea, Candies, Chips, Cookies, Sweet Pastries,  Store Bought Juices, Alcohol in Excess of  1-2 drinks a day, Artificial Sweeteners, Coffee Creamer, and "Sugar-free" Products. This will help patient to have stable blood glucose profile and potentially avoid unintended weight gain.   - I encouraged the patient to switch to  unprocessed or minimally processed complex starch and increased protein intake (animal or plant source), fruits, and vegetables.  - Patient is advised to stick to a routine mealtimes to eat 3 meals  a day and avoid unnecessary snacks ( to snack only to correct hypoglycemia).   - I have approached patient with the following individualized plan to manage diabetes and patient agrees:    -Her husband continues to offer to help. -She is advised to continue Tresiba 10 units every morning with breakfast, associated with strict monitoring of blood glucose 2 times a day-before breakfast and daily at bedtime. - First priority in this patient will be to avoid hypoglycemia.  -Patient is encouraged to call clinic for blood glucose levels less than 70 or above 300 mg /dl.  -Patient is not a candidate for MTF, Incretin therapy.  Her renal function is progressively improving.  - Patient specific target  A1c;  LDL, HDL, Triglycerides, and  Waist Circumference were discussed in detail.  2) BP/HTN:  she is advised to home monitor blood pressure and report if > 140/90 on 2 separate readings.  She is advised to continue her current blood pressure medications including  carvedilol and amlodipine.   Has documented allergy to ARB.  3) Lipids/HPL: Her recent lipid panel showed LDL of 89.  She will continue atorvastatin 10 mg p.o. nightly.   4)  Weight/Diet: CDE Consult has been initiated, she has limited ability to exercise.  5)  Hypothyroidism:  - Her current thyroid function tests are consistent with appropriate replacement.  I advised her to continue levothyroxine 75 g by mouth every morning.   - We discussed about the correct intake of her thyroid hormone, on empty stomach at fasting, with water, separated by at least 30 minutes from breakfast and other medications,  and separated by more than 4 hours from calcium, iron, multivitamins, acid reflux medications (PPIs). -Patient is made aware of the fact that thyroid hormone replacement is needed for life, dose to be adjusted by  periodic monitoring of thyroid function tests.   5) Chronic Care/Health Maintenance:  -Patient is  on ACEI/ARB and Statin medications and encouraged to continue to follow up with Ophthalmology, Podiatrist at least yearly or according to recommendations, and advised to   stay away from smoking. I have recommended yearly flu vaccine and pneumonia vaccination at least every 5 years;  and  sleep for at least 7 hours a day.  - I advised patient to maintain close follow up with [name], DO for primary care needs.   - Patient Care Time Today:  25 min, of which >50% was spent in reviewing her  current and  previous labs/studies, previous treatments, and medications doses and developing a plan for long-term care based on the latest recommendations for standards of care.  [name] participated in the discussions, expressed understanding, and voiced agreement with the above plans.  All questions were answered  to her satisfaction. she is encouraged to contact clinic should she have any questions or concerns prior to her return visit.    Follow up plan: - Return in about 4 months (around 04/13/2019), or logs, for Follow up with Pre-visit Labs, Meter, and Logs.  Glade Lloyd, MD Phone: [phone]  Fax: [phone]  -  This note was partially dictated with voice recognition software. Similar sounding words can be transcribed inadequately or may not  be corrected upon review.  12/11/2018, 4:36 PM

## 2018-12-26 DIAGNOSIS — H353132 Nonexudative age-related macular degeneration, bilateral, intermediate dry stage: Secondary | ICD-10-CM | POA: Diagnosis not present

## 2018-12-26 DIAGNOSIS — H3562 Retinal hemorrhage, left eye: Secondary | ICD-10-CM | POA: Diagnosis not present

## 2018-12-26 DIAGNOSIS — H35043 Retinal micro-aneurysms, unspecified, bilateral: Secondary | ICD-10-CM | POA: Diagnosis not present

## 2018-12-26 DIAGNOSIS — E113553 Type 2 diabetes mellitus with stable proliferative diabetic retinopathy, bilateral: Secondary | ICD-10-CM | POA: Diagnosis not present

## 2018-12-26 LAB — HM DIABETES EYE EXAM

## 2018-12-28 ENCOUNTER — Other Ambulatory Visit: Payer: Self-pay

## 2018-12-28 NOTE — Patient Outreach (Signed)
[place] [facility]) Care Management  12/28/2018  [name] 1945/10/27 [id number]    1st unsuccessful outreach attempt to the patient.  No answer.  HIPAA compliant voicemail left with contact information.  Plan: RN Health Coach will send letter. [place] will make outreach attempt to the patient within thirty business days.

## 2019-01-02 ENCOUNTER — Other Ambulatory Visit: Payer: Self-pay

## 2019-01-02 DIAGNOSIS — N184 Chronic kidney disease, stage 4 (severe): Secondary | ICD-10-CM | POA: Diagnosis not present

## 2019-01-02 DIAGNOSIS — N183 Chronic kidney disease, stage 3 (moderate): Secondary | ICD-10-CM | POA: Diagnosis not present

## 2019-01-02 DIAGNOSIS — M109 Gout, unspecified: Secondary | ICD-10-CM | POA: Diagnosis not present

## 2019-01-02 DIAGNOSIS — N2581 Secondary hyperparathyroidism of renal origin: Secondary | ICD-10-CM | POA: Diagnosis not present

## 2019-01-02 DIAGNOSIS — D631 Anemia in chronic kidney disease: Secondary | ICD-10-CM | POA: Diagnosis not present

## 2019-01-02 DIAGNOSIS — E1122 Type 2 diabetes mellitus with diabetic chronic kidney disease: Secondary | ICD-10-CM | POA: Diagnosis not present

## 2019-01-02 DIAGNOSIS — R809 Proteinuria, unspecified: Secondary | ICD-10-CM | POA: Diagnosis not present

## 2019-01-02 DIAGNOSIS — I129 Hypertensive chronic kidney disease with stage 1 through stage 4 chronic kidney disease, or unspecified chronic kidney disease: Secondary | ICD-10-CM | POA: Diagnosis not present

## 2019-01-02 NOTE — Patient Outreach (Signed)
[facility]) Care Management  01/02/2019   COTI BURD [birth year] [id number]  Subjective: Successful outreach.  Two patient identifiers obtained.  The patient states that she is dong fine.  Denies any pain or falls.  She states that she has been very busy with appointments.  She has had an eye appointment (no problems )  Endocrinology (she has brought her a1c down from 7.7 to 7.0) Dentist ( had a wisdom tooth removed)  and her next appointment she will check on her gout.  She says her blood sugar this morning was 103, yesterday morning 134, and last night 154. Reviewed some healthier food choices. She states she is eating more vegetables and fruits from her garden. She states that she will follow up with Dr Dorris Fetch in four months.   Current Medications:  Current Outpatient Medications  Medication Sig Dispense Refill  . Accu-Chek Softclix Lancets lancets Use as instructed 2 x daily  E11.65 100 each 5  . amLODipine (NORVASC) 5 MG tablet Take 1 tablet (5 mg total) by mouth daily. 90 tablet 1  . atorvastatin (LIPITOR) 10 MG tablet Take 1 tablet (10 mg total) by mouth daily. 90 tablet 1  . carvedilol (COREG) 6.25 MG tablet Take 1 tablet (6.25 mg total) by mouth 2 (two) times daily with a meal. 180 tablet 1  . cholecalciferol (VITAMIN D) 1000 units tablet Take 1,000 Units by mouth daily.    . colchicine 0.6 MG tablet Take 0.6 mg by mouth daily as needed.    . febuxostat (ULORIC) 40 MG tablet Take 80 mg by mouth daily.    . fluticasone (FLONASE) 50 MCG/ACT nasal spray Use 2 sprays in each nostril daily    . furosemide (LASIX) 40 MG tablet Take 1 tablet (40 mg total) by mouth 2 (two) times a week. 30 tablet 3  . glucose blood test strip Use as instructed 2 x daily. E11.65 100 each 5  . Insulin Glargine (BASAGLAR KWIKPEN) 100 UNIT/ML SOPN Inject 0.1 mLs (10 Units total) into the skin daily. 15 mL 1  . levothyroxine (SYNTHROID) 75 MCG tablet Take 1 tablet (75 mcg total) by mouth daily  before breakfast. 90 tablet 1  . pantoprazole (PROTONIX) 20 MG tablet Take 1 tablet (20 mg total) by mouth daily. 90 tablet 1  . ULTICARE MICRO PEN NEEDLES 32G X 4 MM MISC TWICE DAILY 100 each 5   No current facility-administered medications for this visit.     Functional Status:  No flowsheet data found.  Fall/Depression Screening: Fall Risk  01/02/2019 11/20/2018 09/27/2018  Falls in the past year? 0 0 0  Number falls in past yr: - - -  Injury with Fall? - - -  Comment - - -  Risk Factor Category  - - -  Risk for fall due to : - - -  Follow up - - -   PHQ 2/9 Scores 11/20/2018 07/28/2018 03/01/2018 10/20/2017 06/28/2017 06/24/2017 06/24/2017  PHQ - 2 Score 0 0 0 0 0 0 0  PHQ- 9 Score - - - - - - -    Assessment: Patient will continue to benefit from health coach outreach for disease management and support. [facility] CM Care Plan Problem One     Most Recent Value  [facility] Long Term Goal   In 90 days the patient will verbalized that she has lowered her a1c from 7.0 1-2 points  [facility] Long Term Goal Start Date  01/02/19  Interventions for Problem One Long  Term Goal  disscussed diet and picking healthy food choices and moderation, encouraged medication adherence, encouraged continued checking of her blood sugars bid,     Plan: [place] will contact patient in the month of November and patient agrees to next outreach.   [name] RN, BSN, [place] Direct Dial:  [phone]  Fax: [phone]

## 2019-01-23 DIAGNOSIS — E1151 Type 2 diabetes mellitus with diabetic peripheral angiopathy without gangrene: Secondary | ICD-10-CM | POA: Diagnosis not present

## 2019-01-23 DIAGNOSIS — L84 Corns and callosities: Secondary | ICD-10-CM | POA: Diagnosis not present

## 2019-01-23 DIAGNOSIS — M79676 Pain in unspecified toe(s): Secondary | ICD-10-CM | POA: Diagnosis not present

## 2019-01-23 DIAGNOSIS — B351 Tinea unguium: Secondary | ICD-10-CM | POA: Diagnosis not present

## 2019-02-02 ENCOUNTER — Other Ambulatory Visit: Payer: Self-pay

## 2019-02-05 ENCOUNTER — Other Ambulatory Visit: Payer: Self-pay

## 2019-02-05 ENCOUNTER — Ambulatory Visit (INDEPENDENT_AMBULATORY_CARE_PROVIDER_SITE_OTHER): Payer: Medicare Other | Admitting: *Deleted

## 2019-02-05 DIAGNOSIS — Z23 Encounter for immunization: Secondary | ICD-10-CM | POA: Diagnosis not present

## 2019-02-26 ENCOUNTER — Telehealth: Payer: Self-pay | Admitting: Family Medicine

## 2019-02-26 NOTE — Telephone Encounter (Signed)
I will be glad to do this when I get back to the office tomorrow am.

## 2019-02-26 NOTE — Telephone Encounter (Signed)
Ms. Purnell needs an Rx for a new glucometer. She would like this called in to [facility]. Please call her when this is sent in or if there are any questions.  Thank you!

## 2019-02-27 ENCOUNTER — Other Ambulatory Visit: Payer: Self-pay | Admitting: Family Medicine

## 2019-02-27 DIAGNOSIS — E1122 Type 2 diabetes mellitus with diabetic chronic kidney disease: Secondary | ICD-10-CM

## 2019-02-27 MED ORDER — LANCETS MISC. MISC: 3 refills | Status: DC

## 2019-02-27 MED ORDER — GLUCOSE BLOOD VI STRP: ORAL_STRIP | 5 refills | Status: DC

## 2019-02-27 MED ORDER — BLOOD GLUCOSE METER KIT: PACK | 0 refills | Status: DC

## 2019-02-27 NOTE — Telephone Encounter (Signed)
Given to [place] to fax.

## 2019-03-01 NOTE — Telephone Encounter (Signed)
done

## 2019-04-02 ENCOUNTER — Other Ambulatory Visit: Payer: Self-pay

## 2019-04-03 ENCOUNTER — Other Ambulatory Visit: Payer: Self-pay

## 2019-04-03 ENCOUNTER — Ambulatory Visit: Payer: Medicare Other

## 2019-04-03 ENCOUNTER — Ambulatory Visit (INDEPENDENT_AMBULATORY_CARE_PROVIDER_SITE_OTHER): Payer: Medicare Other | Admitting: Family Medicine

## 2019-04-03 ENCOUNTER — Encounter: Payer: Self-pay | Admitting: Family Medicine

## 2019-04-03 VITALS — BP 120/58 | HR 62 | Temp 97.1°F | Ht 62.0 in | Wt 152.0 lb

## 2019-04-03 DIAGNOSIS — E034 Atrophy of thyroid (acquired): Secondary | ICD-10-CM | POA: Diagnosis not present

## 2019-04-03 DIAGNOSIS — N183 Chronic kidney disease, stage 3 unspecified: Secondary | ICD-10-CM | POA: Diagnosis not present

## 2019-04-03 DIAGNOSIS — E785 Hyperlipidemia, unspecified: Secondary | ICD-10-CM

## 2019-04-03 DIAGNOSIS — E1159 Type 2 diabetes mellitus with other circulatory complications: Secondary | ICD-10-CM | POA: Diagnosis not present

## 2019-04-03 DIAGNOSIS — E1121 Type 2 diabetes mellitus with diabetic nephropathy: Secondary | ICD-10-CM

## 2019-04-03 DIAGNOSIS — I10 Essential (primary) hypertension: Secondary | ICD-10-CM

## 2019-04-03 DIAGNOSIS — N1832 Chronic kidney disease, stage 3b: Secondary | ICD-10-CM

## 2019-04-03 DIAGNOSIS — E039 Hypothyroidism, unspecified: Secondary | ICD-10-CM | POA: Diagnosis not present

## 2019-04-03 DIAGNOSIS — Z794 Long term (current) use of insulin: Secondary | ICD-10-CM | POA: Diagnosis not present

## 2019-04-03 DIAGNOSIS — E1122 Type 2 diabetes mellitus with diabetic chronic kidney disease: Secondary | ICD-10-CM | POA: Diagnosis not present

## 2019-04-03 DIAGNOSIS — E1169 Type 2 diabetes mellitus with other specified complication: Secondary | ICD-10-CM | POA: Diagnosis not present

## 2019-04-03 LAB — BAYER DCA HB A1C WAIVED: HB A1C (BAYER DCA - WAIVED): 7.7 % — ABNORMAL HIGH (ref <7.0)

## 2019-04-03 MED ORDER — CARVEDILOL 6.25 MG PO TABS: 6.2500 mg | ORAL_TABLET | Freq: Two times a day (BID) | ORAL | 3 refills | Status: DC

## 2019-04-03 MED ORDER — ATORVASTATIN CALCIUM 10 MG PO TABS: 10.0000 mg | ORAL_TABLET | Freq: Every day | ORAL | 3 refills | Status: DC

## 2019-04-03 MED ORDER — LEVOTHYROXINE SODIUM 75 MCG PO TABS: 75.0000 ug | ORAL_TABLET | Freq: Every day | ORAL | 3 refills | Status: DC

## 2019-04-03 MED ORDER — PANTOPRAZOLE SODIUM 20 MG PO TBEC: 20.0000 mg | DELAYED_RELEASE_TABLET | Freq: Every day | ORAL | 3 refills | Status: DC

## 2019-04-03 MED ORDER — AMLODIPINE BESYLATE 5 MG PO TABS: 5.0000 mg | ORAL_TABLET | Freq: Every day | ORAL | 3 refills | Status: DC

## 2019-04-03 NOTE — Patient Instructions (Addendum)
Sugar is a little on the high side.  Keep an eye on your diet and try and get a little more exercise in (even if your sitting, you can still do something to get your body moving).  I will forward your labs to Dr Dorris Fetch.

## 2019-04-03 NOTE — Progress Notes (Addendum)
.   Subjective: CC: f/u DM, HTN, HLD PCP: [name], DO ZOX:WRUEA H [name] is a 73 y.o. female presenting to clinic today for:  1. Type 2 Diabetes with hypertension, hyperlipidemia, DM retinopathy and CKD 4:  Patient reports morning blood sugars have been around the 120s with evening blood sugars around the 140s.  She sometimes takes a sugary beverage or food prior to bedtime because her blood sugars tend to drop at nighttime.  Taking medication(s): 10 units of Basaglar every morning, Lipitor 10 mg, Coreg 6.25 mg p.o. twice daily, Norvasc 5 mg daily, Side effects: None.  She sees Dr. Dorris Fetch with follow-up soon.  Asking for her labs to be forwarded.  Last eye exam: Sees Dr Radene Ou in 9 months. Last foot exam: UTD Last A1c:   Lab Results  Component Value Date   HGBA1C 7.7 (H) 11/20/2018   Nephropathy screen indicated?: yes, has allergy to ARB/ ACE-I.  She has known chronic kidney disease and is followed by renal (Dr Clover Mealy) in [place].   Last flu, zoster and/or pneumovax:  Immunization History  Administered Date(s) Administered  . Fluad Quad(high Dose 65+) 02/05/2019  . Influenza, High Dose Seasonal PF 03/05/2016, 03/14/2017, 03/01/2018  . Influenza,inj,Quad PF,6+ Mos 02/16/2013, 02/27/2015  . Pneumococcal Conjugate-13 10/11/2014  . Pneumococcal Polysaccharide-23 10/10/2012  . Tdap 10/06/2012   ROS: No chest pain, shortness of breath, falls.  No visual disturbance.  2.  Hypothyroidism, atrophic Patient reports compliance with Synthroid 75 mcg daily.  Denies any unplanned weight changes, change in bowel habits, heart palpitations, tremors.  She sees Dr. Dorris Fetch   ROS: Per HPI  Allergies  Allergen Reactions  . Aspirin Nausea And Vomiting  . Ciprofloxacin Other (See Comments)    Upset Stomach  . Codeine Nausea And Vomiting    Makes me sick   . Losartan     Hyperkalemia   . Micardis [Telmisartan] Other (See Comments)    unknown  . Nexium [Esomeprazole  Magnesium] Other (See Comments)    Causes internal bleeding  . Niaspan [Niacin Er] Other (See Comments)    Reaction is unknown  . Onglyza [Saxagliptin] Other (See Comments)    Reaction is unknown  . Other     No otc pain medications  . Rofecoxib Other (See Comments)    unknown  . Simvastatin   . Tequin [Gatifloxacin] Other (See Comments)    Reaction is unknown  . Welchol [Colesevelam Hcl]   . Amoxicillin Nausea And Vomiting and Rash    Has patient had a PCN reaction causing immediate rash, facial/tongue/throat swelling, SOB or lightheadedness with hypotension: Yes Has patient had a PCN reaction causing severe rash involving mucus membranes or skin necrosis: no  Has patient had a PCN reaction that required hospitalization No Has patient had a PCN reaction occurring within the last 10 years: No If all of the above answers are "NO", then may proceed with Cephalosporin use.    Past Medical History:  Diagnosis Date  . Anal fistula   . Anemia in chronic renal disease    Aranesp injection --  when Hg <11, last injection 12-24-14.  Marland Kitchen Anxiety   . Arthritis    knees and hand/fingers. "broke back"-being evaluated for this"weakness left leg"  . Cataract    both eyes done  . 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 CKD (chronic kidney disease), stage III    nephrologist--  dr Mercy Moore-- LOV  07-09-2016  . Complication of  anesthesia    post-op confusion   . Constipation   . Coronary artery disease   . Diabetic gastroparesis ([place])   . Diabetic retinopathy ([place])    bilateral --  monitored by dr [name]  . Diverticulosis of colon   . GAD (generalized anxiety disorder)   . GERD (gastroesophageal reflux disease)   . History of colon polyps    benign  . History of esophagitis   . History of GI bleed    upper 2009  due to esophagitis  &  2002  due to Mallory-Weiss tear  . History of hyperkalemia    pt had previously been canceled twice dos due to elevated K+,  07-29-2016 last date cancelled -- pt visited pcp same day treated w/ kayexelate and K+ came down, pt brought all her medication's in to pcp office and found pt was taking losartan that had been discontinued due to ckd, pcp stated this was cause of elevated K+  . 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 History of rectal abscess    12-29-2004  bedside I & D  . Hyperlipidemia   . Hypertension   . Hypothyroidism   . LAFB (left anterior fascicular block)   . Right bundle branch block   . Sacral decubitus ulcer    since 2014- 01-02-15 remains with wound" gauze dressing changes daily.  . Type II diabetes mellitus ([place])     Current Outpatient Medications:  .  Accu-Chek Softclix Lancets lancets, Use as instructed 2 x daily  E11.65, Disp: 100 each, Rfl: 5 .  amLODipine (NORVASC) 5 MG tablet, Take 1 tablet (5 mg total) by mouth daily., Disp: 90 tablet, Rfl: 1 .  atorvastatin (LIPITOR) 10 MG tablet, Take 1 tablet (10 mg total) by mouth daily., Disp: 90 tablet, Rfl: 1 .  blood glucose meter kit and supplies, Dispense based on patient and insurance preference. Use up to four times daily as directed. (FOR ICD-10 E10.9, E11.9)., Disp: 1 each, Rfl: 0 .  carvedilol (COREG) 6.25 MG tablet, Take 1 tablet (6.25 mg total) by mouth 2 (two) times daily with a meal., Disp: 180 tablet, Rfl: 1 .  cholecalciferol (VITAMIN D) 1000 units tablet, Take 1,000 Units by mouth daily., Disp: , Rfl:  .  colchicine 0.6 MG tablet, Take 0.6 mg by mouth daily as needed., Disp: , Rfl:  .  fluticasone (FLONASE) 50 MCG/ACT nasal spray, Use 2 sprays in each nostril daily, Disp: , Rfl:  .  furosemide (LASIX) 40 MG tablet, Take 1 tablet (40 mg total) by mouth 2 (two) times a week., Disp: 30 tablet, Rfl: 3 .  glucose blood test strip, Use as instructed up to 4x daily. E11.65, Disp: 300 each, Rfl: 5 .  Insulin Glargine (BASAGLAR KWIKPEN) 100 UNIT/ML SOPN, Inject 0.1 mLs (10 Units total) into the skin daily., Disp: 15  mL, Rfl: 1 .  Lancets Misc. MISC, Use as directed to check blood sugar up to 4x daily. E11.65, Disp: 300 each, Rfl: 3 .  levothyroxine (SYNTHROID) 75 MCG tablet, Take 1 tablet (75 mcg total) by mouth daily before breakfast., Disp: 90 tablet, Rfl: 1 .  pantoprazole (PROTONIX) 20 MG tablet, Take 1 tablet (20 mg total) by mouth daily., Disp: 90 tablet, Rfl: 1 .  ULT[employer]ARE MICRO PEN NEEDLES 32G X 4 MM MISC, TWICE DAILY, Disp: 100 each, Rfl: 5 Social History   Socioeconomic History  . Marital status: Married    Spouse name: [name]  .  Number of children: 0  . Years of education: 20  . Highest education level: Not on file  Occupational History  . Occupation: Retired    Fish farm manager: RETIRED  Social Needs  . Financial resource strain: Not on file  . Food insecurity    Worry: Not on file    Inability: Not on file  . Transportation needs    Medical: Not on file    Non-medical: Not on file  Tobacco Use  . Smoking status: Never Smoker  . Smokeless tobacco: Never Used  Substance and Sexual Activity  . Alcohol use: No  . Drug use: No  . Sexual activity: Not Currently  Lifestyle  . Physical activity    Days per week: Not on file    Minutes per session: Not on file  . Stress: Not on file  Relationships  . Social Herbalist on phone: Not on file    Gets together: Not on file    Attends religious service: Not on file    Active member of club or organization: Not on file    Attends meetings of clubs or organizations: Not on file    Relationship status: Not on file  . Intimate partner violence    Fear of current or ex partner: Not on file    Emotionally abused: Not on file    Physically abused: Not on file    Forced sexual activity: Not on file  Other Topics Concern  . Not on file  Social History Narrative   Lives with husband.    Caffeine use: none   Family History  Problem Relation Age of Onset  . Colon cancer Father 29  . Prostate cancer Father   . Diabetes Father   .  Coronary artery disease Mother 73  . Heart disease Mother   . Diabetes Mother   . Coronary artery disease Sister 47  . Diabetes Sister   . Heart disease Sister   . Diabetes Sister   . Diabetes Sister   . Diabetes Sister   . Diabetes Sister   . Diabetes Sister   . Diabetes Maternal Grandmother   . Esophageal cancer Neg Hx   . Stomach cancer Neg Hx   . Rectal cancer Neg Hx     Objective: Office vital signs reviewed. BP (!) 120/58 Comment: manual  Pulse 62   Temp (!) 97.1 F (36.2 C) (Oral)   Ht '5\' 2"'  (1.575 m)   Wt 152 lb (68.9 kg)   SpO2 98%   BMI 27.80 kg/m   Physical Examination:  General: Awake, alert, chronically ill appearing female, No acute distress HEENT: Normal    Neck: No masses palpated. No lymphadenopathy.  No goiter    Eyes:  Sclera white.  No exophthalmos Cardio: regular rate and rhythm, S1S2 heard, no murmurs appreciated Pulm: clear to auscultation bilaterally, no wheezes, rhonchi or rales; normal work of breathing on room air Extremities: warm, well perfused, No edema, cyanosis or clubbing; +2 pulses bilaterally Skin: Normal temperature Neuro: No resting tremor  No results found for this or any previous visit (from the past 24 hour(s)).  Assessment/ Plan: 73 y.o. female   1. Type 2 diabetes mellitus with stage 3b chronic kidney disease, with long-term current use of insulin (HCC) A1c stable from previous check at 7.7 today.  Continue insulin at current regimen.  Follow-up with endocrinologist as scheduled - [id number] - Basic Metabolic Panel  2. Hyperlipidemia associated with type 2 diabetes mellitus ([place]) Continue  statin - atorvastatin (LIPITOR) 10 MG tablet; Take 1 tablet (10 mg total) by mouth daily.  Dispense: 90 tablet; Refill: 3  3. Hypertension associated with diabetes ([place]) Controlled.  Continue blood pressure medications - amLODipine (NORVASC) 5 MG tablet; Take 1 tablet (5 mg total) by mouth daily.  Dispense: 90 tablet; Refill: 3 -  carvedilol (COREG) 6.25 MG tablet; Take 1 tablet (6.25 mg total) by mouth 2 (two) times daily with a meal.  Dispense: 180 tablet; Refill: 3  4. Hypothyroidism due to acquired atrophy of thyroid Check thyroid panel.  Renewal of Synthroid sent. - Thyroid Panel With TSH - levothyroxine (SYNTHROID) 75 MCG tablet; Take 1 tablet (75 mcg total) by mouth daily before breakfast.  Dispense: 90 tablet; Refill: 3   Orders Placed This Encounter  Procedures  . hgba1c  . Basic Metabolic Panel  . Thyroid Panel With TSH   No orders of the defined types were placed in this encounter.    [name], DO [place] [phone]

## 2019-04-04 LAB — THYROID PANEL WITH TSH
Free Thyroxine Index: 2.9 (ref 1.2–4.9)
T3 Uptake Ratio: 32 % (ref 24–39)
T4, Total: 9.1 ug/dL (ref 4.5–12.0)
TSH: 0.856 u[IU]/mL (ref 0.450–4.500)

## 2019-04-04 LAB — BASIC METABOLIC PANEL
BUN/Creatinine Ratio: 20 (ref 12–28)
BUN: 31 mg/dL — ABNORMAL HIGH (ref 8–27)
CO2: 20 mmol/L (ref 20–29)
Calcium: 9.3 mg/dL (ref 8.7–10.3)
Chloride: 105 mmol/L (ref 96–106)
Creatinine, Ser: 1.53 mg/dL — ABNORMAL HIGH (ref 0.57–1.00)
GFR calc Af Amer: 39 mL/min/{1.73_m2} — ABNORMAL LOW (ref >59)
GFR calc non Af Amer: 34 mL/min/{1.73_m2} — ABNORMAL LOW (ref >59)
Glucose: 179 mg/dL — ABNORMAL HIGH (ref 65–99)
Potassium: 4.4 mmol/L (ref 3.5–5.2)
Sodium: 142 mmol/L (ref 134–144)

## 2019-04-05 ENCOUNTER — Other Ambulatory Visit: Payer: Self-pay | Admitting: *Deleted

## 2019-04-05 NOTE — Patient Outreach (Signed)
[facility]) Care Management  04/05/2019  [name] Dec 10, 1945 [id number]   RN Health Coach Monthly Outreach  Referral Date:  06/24/2017 Referral Source:  MD Office Reason for Referral:  "consult for medications, too expensive especially insulin" Insurance:  Medicare   Outreach Attempt:  Outreach attempt #1 to patient for introduction and follow up. No answer. RN Health Coach left HIPAA compliant voicemail message along with contact information.  Plan:  RN Health Coach will make another telephone outreach attempt to patient within the month of December.   [place] [phone] [name]

## 2019-04-17 ENCOUNTER — Ambulatory Visit (INDEPENDENT_AMBULATORY_CARE_PROVIDER_SITE_OTHER): Payer: Medicare Other | Admitting: "Endocrinology

## 2019-04-17 ENCOUNTER — Encounter: Payer: Self-pay | Admitting: "Endocrinology

## 2019-04-17 ENCOUNTER — Other Ambulatory Visit: Payer: Self-pay

## 2019-04-17 DIAGNOSIS — N1832 Chronic kidney disease, stage 3b: Secondary | ICD-10-CM | POA: Diagnosis not present

## 2019-04-17 DIAGNOSIS — E1121 Type 2 diabetes mellitus with diabetic nephropathy: Secondary | ICD-10-CM | POA: Diagnosis not present

## 2019-04-17 DIAGNOSIS — E782 Mixed hyperlipidemia: Secondary | ICD-10-CM

## 2019-04-17 DIAGNOSIS — E039 Hypothyroidism, unspecified: Secondary | ICD-10-CM | POA: Diagnosis not present

## 2019-04-17 DIAGNOSIS — I10 Essential (primary) hypertension: Secondary | ICD-10-CM

## 2019-04-17 DIAGNOSIS — E1122 Type 2 diabetes mellitus with diabetic chronic kidney disease: Secondary | ICD-10-CM

## 2019-04-17 DIAGNOSIS — Z794 Long term (current) use of insulin: Secondary | ICD-10-CM

## 2019-04-17 NOTE — Progress Notes (Signed)
04/17/2019                                                    Endocrinology Telehealth Visit Follow up Note -During COVID -19 Pandemic  This visit type was conducted due to national recommendations for restrictions regarding the COVID-19 Pandemic  in an effort to limit this patient's exposure and mitigate transmission of the corona virus.  Due to her co-morbid illnesses, [name] is at  moderate to high risk for complications without adequate follow up.  This format is felt to be most appropriate for her at this time.  I connected with this patient on 04/17/2019   by telephone and verified that I am speaking with the correct person using two identifiers. [name], [birth year]. she has verbally consented to this visit. All issues noted in this document were discussed and addressed. The format was not optimal for physical exam.    Subjective:    Patient ID: [name], female    DOB: 06/16/1945.  She is being engaged in telehealth via telephone  for follow up of  uncontrolled type 2 diabetes, hypothyroidism, hypertension, hyperlipidemia.  Past Medical History:  Diagnosis Date  . Anal fistula   . Anemia in chronic renal disease    Aranesp injection --  when Hg <11, last injection 12-24-14.  Marland Kitchen Anxiety   . Arthritis    knees and hand/fingers. "broke back"-being evaluated for this"weakness left leg"  . Cataract    both eyes done  . 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 CKD (chronic kidney disease), stage III    nephrologist--  dr Mercy Moore-- LOV  07-09-2016  . Complication of anesthesia    post-op confusion   . Constipation   . Coronary artery disease   . Diabetic gastroparesis ([place])   . Diabetic retinopathy ([place])    bilateral --  monitored by dr [name]  . Diverticulosis of colon   . GAD (generalized anxiety disorder)   . GERD (gastroesophageal reflux disease)   . History of colon polyps    benign  . History of esophagitis   . History of  GI bleed    upper 2009  due to esophagitis  &  2002  due to Mallory-Weiss tear  . History of hyperkalemia    pt had previously been canceled twice dos due to elevated K+, 07-29-2016 last date cancelled -- pt visited pcp same day treated w/ kayexelate and K+ came down, pt brought all her medication's in to pcp office and found pt was taking losartan that had been discontinued due to ckd, pcp stated this was cause of elevated K+  . 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 History of rectal abscess    12-29-2004  bedside I & D  . Hyperlipidemia   . Hypertension   . Hypothyroidism   . LAFB (left anterior fascicular block)   . Right bundle branch block   . Sacral decubitus ulcer    since 2014- 01-02-15 remains with wound" gauze dressing changes daily.  . Type II diabetes mellitus ([place])    Past Surgical History:  Procedure Laterality Date  . APPLICATION OF A-CELL OF EXTREMITY N/A 04/07/2015   Procedure: A CELL PLACMENT;  Surgeon: Loel Lofty Dillingham, DO;  Location: [facility]  OR;  Service: Clinical cytogeneticist;  Laterality: N/A;  . CARDIOVASCULAR STRESS TEST  12-30-2004   normal perfusion study/  no ischemia or infartion/  normal LV wall function and wall motion , ef 66%  . CATARACT EXTRACTION W/ INTRAOCULAR LENS  IMPLANT, BILATERAL  1995  . COLONOSCOPY  2008   w/Dr.Brodie   . COLONOSCOPY W/ POLYPECTOMY  last one 2008  . COMPRESSION HIP SCREW Right 05/18/2014   Procedure: COMPRESSION HIP;  Surgeon: Carole Civil, MD;  Location: [facility];  Service: Orthopedics;  Laterality: Right;  . ESOPHAGOGASTRODUODENOSCOPY  last one 01-09-2011  . EVALUATION UNDER ANESTHESIA WITH FISTULECTOMY N/A 01/06/2015   Procedure: EXAM UNDER ANESTHESIA , placement of seton;  Surgeon: Jackolyn Confer, MD;  Location: [facility];  Service: General;  Laterality: N/A;  . I&D EXTREMITY N/A 04/07/2015   Procedure: IRRIGATION AND DEBRIDEMENT ISCHIAL ULCER;  Surgeon: Loel Lofty Dillingham, DO;  Location: [place];  Service:  Plastics;  Laterality: N/A;  . INCISION AND DRAINAGE OF WOUND N/A 09/30/2014   Procedure: IRRIGATION AND DEBRIDEMENT SACRAL WOUND, EXCISION OF PERIRECTAL TRACT WITH PLACEMENT OF ACCELL;  Surgeon: Theodoro Kos, DO;  Location: [place];  Service: Plastics;  Laterality: N/A;  . LIGATION OF INTERNAL FISTULA TRACT N/A 10/22/2016   Procedure: LIGATION OF INTERNAL FISTULA TRACT;  Surgeon: Leighton Ruff, MD;  Location: [facility];  Service: General;  Laterality: N/A;  . ORIF FEMUR FRACTURE Left 10/09/2012   Procedure: OPEN REDUCTION INTERNAL FIXATION (ORIF) DISTAL FEMUR FRACTURE;  Surgeon: Rozanna Box, MD;  Location: [place];  Service: Orthopedics;  Laterality: Left;  . RETINOPATHY SURGERY Bilateral 1980's?  . TRANSTHORACIC ECHOCARDIOGRAM  02-18-2011   mild LVH,  ef 55-60%,  grade I diastolic dysfunction/  mild TR/  RV systolic pressure increased consistant with moderate pulmonary hypertension   Social History   Socioeconomic History  . Marital status: Married    Spouse name: [name]  . Number of children: 0  . Years of education: 21  . Highest education level: Not on file  Occupational History  . Occupation: Retired    Fish farm manager: RETIRED  Social Needs  . Financial resource strain: Not on file  . Food insecurity    Worry: Not on file    Inability: Not on file  . Transportation needs    Medical: Not on file    Non-medical: Not on file  Tobacco Use  . Smoking status: Never Smoker  . Smokeless tobacco: Never Used  Substance and Sexual Activity  . Alcohol use: No  . Drug use: No  . Sexual activity: Not Currently  Lifestyle  . Physical activity    Days per week: Not on file    Minutes per session: Not on file  . Stress: Not on file  Relationships  . Social Herbalist on phone: Not on file    Gets together: Not on file    Attends religious service: Not on file    Active member of club or organization: Not on file    Attends meetings of clubs or  organizations: Not on file    Relationship status: Not on file  Other Topics Concern  . Not on file  Social History Narrative   Lives with husband.    Caffeine use: none   Outpatient Encounter Medications as of 04/17/2019  Medication Sig  . Accu-Chek Softclix Lancets lancets Use as instructed 2 x daily  E11.65  . amLODipine (NORVASC) 5 MG tablet Take 1 tablet (  5 mg total) by mouth daily.  Marland Kitchen atorvastatin (LIPITOR) 10 MG tablet Take 1 tablet (10 mg total) by mouth daily.  . blood glucose meter kit and supplies Dispense based on patient and insurance preference. Use up to four times daily as directed. (FOR ICD-10 E10.9, E11.9).  . carvedilol (COREG) 6.25 MG tablet Take 1 tablet (6.25 mg total) by mouth 2 (two) times daily with a meal.  . cholecalciferol (VITAMIN D) 1000 units tablet Take 1,000 Units by mouth daily.  . colchicine 0.6 MG tablet Take 0.6 mg by mouth daily as needed.  . fluticasone (FLONASE) 50 MCG/ACT nasal spray Use 2 sprays in each nostril daily  . furosemide (LASIX) 40 MG tablet Take 1 tablet (40 mg total) by mouth 2 (two) times a week.  Marland Kitchen glucose blood test strip Use as instructed up to 4x daily. E11.65  . Insulin Glargine (BASAGLAR KWIKPEN) 100 UNIT/ML SOPN Inject 0.1 mLs (10 Units total) into the skin daily.  . Lancets Misc. MISC Use as directed to check blood sugar up to 4x daily. E11.65  . levothyroxine (SYNTHROID) 75 MCG tablet Take 1 tablet (75 mcg total) by mouth daily before breakfast.  . pantoprazole (PROTONIX) 20 MG tablet Take 1 tablet (20 mg total) by mouth daily.  Marland Kitchen ULTICARE MICRO PEN NEEDLES 32G X 4 MM MISC TWICE DAILY   No facility-administered encounter medications on file as of 04/17/2019.    ALLERGIES: Allergies  Allergen Reactions  . Aspirin Nausea And Vomiting  . Ciprofloxacin Other (See Comments)    Upset Stomach  . Codeine Nausea And Vomiting    Makes me sick   . Losartan     Hyperkalemia   . Micardis [Telmisartan] Other (See Comments)     unknown  . Nexium [Esomeprazole Magnesium] Other (See Comments)    Causes internal bleeding  . Niaspan [Niacin Er] Other (See Comments)    Reaction is unknown  . Onglyza [Saxagliptin] Other (See Comments)    Reaction is unknown  . Other     No otc pain medications  . Rofecoxib Other (See Comments)    unknown  . Simvastatin   . Tequin [Gatifloxacin] Other (See Comments)    Reaction is unknown  . Welchol [Colesevelam Hcl]   . Amoxicillin Nausea And Vomiting and Rash    Has patient had a PCN reaction causing immediate rash, facial/tongue/throat swelling, SOB or lightheadedness with hypotension: Yes Has patient had a PCN reaction causing severe rash involving mucus membranes or skin necrosis: no  Has patient had a PCN reaction that required hospitalization No Has patient had a PCN reaction occurring within the last 10 years: No If all of the above answers are "NO", then may proceed with Cephalosporin use.    VACCINATION STATUS: Immunization History  Administered Date(s) Administered  . Fluad Quad(high Dose 65+) 02/05/2019  . Influenza, High Dose Seasonal PF 03/05/2016, 03/14/2017, 03/01/2018  . Influenza,inj,Quad PF,6+ Mos 02/16/2013, 02/27/2015  . Pneumococcal Conjugate-13 10/11/2014  . Pneumococcal Polysaccharide-23 10/10/2012  . Tdap 10/06/2012    Diabetes She presents for her follow-up diabetic visit. She has type 2 diabetes mellitus. Onset time: She was diagnosed at approximate age of 60 years . Her disease course has been stable. There are no hypoglycemic associated symptoms. Pertinent negatives for hypoglycemia include no confusion, headaches, pallor or seizures. Associated symptoms include visual change. Pertinent negatives for diabetes include no chest pain, no fatigue, no polydipsia and no polyphagia. There are no hypoglycemic complications. Symptoms are stable. Diabetic complications include  nephropathy, peripheral neuropathy, PVD and retinopathy. Risk factors for coronary  artery disease include diabetes mellitus, dyslipidemia, hypertension and sedentary lifestyle. Current diabetic treatment includes oral agent (monotherapy). She is compliant with treatment most of the time. Her weight is stable. She is following a generally unhealthy diet. When asked about meal planning, she reported none. She has not had a previous visit with a dietitian. She never participates in exercise. Her home blood glucose trend is decreasing steadily. Her breakfast blood glucose range is generally 130-140 mg/dl. Her bedtime blood glucose range is generally 130-140 mg/dl. Her overall blood glucose range is 130-140 mg/dl. An ACE inhibitor/angiotensin II receptor blocker is being taken. Eye exam is current.  Thyroid Problem Presents for initial visit. Onset time: 15 years. Symptoms include visual change. Patient reports no cold intolerance, diarrhea, fatigue, heat intolerance or palpitations. The symptoms have been stable. Past treatments include levothyroxine. The following procedures have not been performed: thyroidectomy.  Hypertension This is a chronic problem. The current episode started more than 1 year ago. Pertinent negatives include no chest pain, headaches, palpitations or shortness of breath. Past treatments include angiotensin blockers. Hypertensive end-organ damage includes PVD and retinopathy. Identifiable causes of hypertension include a thyroid problem.   Review of systems: Limited as above.  Objective:    There were no vitals taken for this visit.  Wt Readings from Last 3 Encounters:  04/03/19 152 lb (68.9 kg)  11/20/18 155 lb 9.6 oz (70.6 kg)  11/16/18 151 lb (68.5 kg)      Results for orders placed or performed in visit on 04/03/19  hgba1c  Result Value Ref Range   HB A1C (BAYER DCA - WAIVED) 7.7 (H) <9.2 %  Basic Metabolic Panel  Result Value Ref Range   Glucose 179 (H) 65 - 99 mg/dL   BUN 31 (H) 8 - 27 mg/dL   Creatinine, Ser 1.53 (H) 0.57 - 1.00 mg/dL   GFR calc  non Af Amer 34 (L) >59 mL/min/1.73   GFR calc Af Amer 39 (L) >59 mL/min/1.73   BUN/Creatinine Ratio 20 12 - 28   Sodium 142 134 - 144 mmol/L   Potassium 4.4 3.5 - 5.2 mmol/L   Chloride 105 96 - 106 mmol/L   CO2 20 20 - 29 mmol/L   Calcium 9.3 8.7 - 10.3 mg/dL  Thyroid Panel With TSH  Result Value Ref Range   TSH 0.856 0.450 - 4.500 uIU/mL   T4, Total 9.1 4.5 - 12.0 ug/dL   T3 Uptake Ratio 32 24 - 39 %   Free Thyroxine Index 2.9 1.2 - 4.9   Diabetic Labs (most recent): Lab Results  Component Value Date   HGBA1C 7.7 (H) 04/03/2019   HGBA1C 7.7 (H) 11/20/2018   HGBA1C 7.2 (H) 03/01/2018   Lipid Panel     Component Value Date/Time   CHOL 160 03/14/2017 0824   CHOL 105 09/11/2012 1057   TRIG 170 (H) 03/14/2017 0824   TRIG 170 (H) 10/11/2014 1159   TRIG 79 09/11/2012 1057   HDL 37 (L) 03/14/2017 0824   HDL 53 10/11/2014 1159   HDL 41 09/11/2012 1057   CHOLHDL 4.3 03/14/2017 0824   LDLCALC 89 03/14/2017 0824   LDLCALC 50 02/14/2014 1012   LDLCALC 48 09/11/2012 1057     Assessment & Plan:   1. Type 2 diabetes mellitus with stage 4 chronic kidney disease, without long-term current use of insulin ([name])  - Patient has currently controlled asymptomatic type 2 DM since  73  years of age. -She reports near target glycemic profile fasting ranging between 99-'1 4 3 ' fasting, and between 124-176 at bedtime.  Her most recent labs show A1c of 7.7%, unchanged from last visit, however slowly improving from 10%.     -her  diabetes is complicated by PAD, CKD, neuropathy and patient remains at a high risk for more acute and chronic complications of diabetes which include CAD, CVA, CKD, retinopathy, and neuropathy. These are all discussed in detail with the patient.  - I have counseled the patient on diet management  by adopting a carbohydrate restricted/protein rich diet.   -  Suggestion is made for her to avoid simple carbohydrates  from her diet including Cakes, Sweet Desserts / Pastries,  Ice Cream, Soda (diet and regular), Sweet Tea, Candies, Chips, Cookies, Sweet Pastries,  Store Bought Juices, Alcohol in Excess of  1-2 drinks a day, Artificial Sweeteners, Coffee Creamer, and "Sugar-free" Products. This will help patient to have stable blood glucose profile and potentially avoid unintended weight gain.   - I encouraged the patient to switch to  unprocessed or minimally processed complex starch and increased protein intake (animal or plant source), fruits, and vegetables.  - Patient is advised to stick to a routine mealtimes to eat 3 meals  a day and avoid unnecessary snacks ( to snack only to correct hypoglycemia).   - I have approached patient with the following individualized plan to manage diabetes and patient agrees:    -Her husband, Ludwig Clarks , continues to offer to help. -She is advised to continue Basaglar  10 units every morning with breakfast, associated with strict monitoring of blood glucose 2 times a day-before breakfast and daily at bedtime. - First priority in this patient will be to avoid hypoglycemia.  -Patient is encouraged to call clinic for blood glucose levels less than 70 or above 300 mg /dl.  -Patient is not a candidate for MTF, Incretin therapy.  Her renal function is progressively improving.  - Patient specific target  A1c;  LDL, HDL, Triglycerides, and  Waist Circumference were discussed in detail.  2) BP/HTN:  she is advised to home monitor blood pressure and report if > 140/90 on 2 separate readings.   She is advised to continue her current blood pressure medications including  carvedilol and amlodipine.   Has documented allergy to ARB.  3) Lipids/HPL: Her recent lipid panel showed LDL of 89.  She will continue to benefit from statin treatment, advised to continue atorvastatin 10 mg p.o. nightly.    4)  Weight/Diet: CDE Consult has been initiated, she has limited ability to exercise.  5)  Hypothyroidism:  - Her current thyroid function tests are  consistent with appropriate replacement. -She is advised to continue levothyroxine 75 g by mouth every morning.   - We discussed about the correct intake of her thyroid hormone, on empty stomach at fasting, with water, separated by at least 30 minutes from breakfast and other medications,  and separated by more than 4 hours from calcium, iron, multivitamins, acid reflux medications (PPIs). -Patient is made aware of the fact that thyroid hormone replacement is needed for life, dose to be adjusted by periodic monitoring of thyroid function tests.    5) Chronic Care/Health Maintenance:  -Patient is  on ACEI/ARB and Statin medications and encouraged to continue to follow up with Ophthalmology, Podiatrist at least yearly or according to recommendations, and advised to   stay away from smoking. I have recommended yearly flu vaccine and pneumonia vaccination  at least every 5 years;  and  sleep for at least 7 hours a day.  - I advised patient to maintain close follow up with [name], DO for primary care needs.  - Patient Care Time Today:  25 min, of which >50% was spent in  counseling and the rest reviewing her  current and  previous labs/studies, previous treatments, her blood glucose readings, and medications' doses and developing a plan for long-term care based on the latest recommendations for standards of care.   [name] participated in the discussions, expressed understanding, and voiced agreement with the above plans.  All questions were answered to her satisfaction. she is encouraged to contact clinic should she have any questions or concerns prior to her return visit.     Follow up plan: - No follow-ups on file.  Glade Lloyd, MD Phone: [phone]  Fax: [phone]  -  This note was partially dictated with voice recognition software. Similar sounding words can be transcribed inadequately or may not  be corrected upon review.  04/17/2019, 9:47 AM

## 2019-04-24 DIAGNOSIS — M79676 Pain in unspecified toe(s): Secondary | ICD-10-CM | POA: Diagnosis not present

## 2019-04-24 DIAGNOSIS — L84 Corns and callosities: Secondary | ICD-10-CM | POA: Diagnosis not present

## 2019-04-24 DIAGNOSIS — E1151 Type 2 diabetes mellitus with diabetic peripheral angiopathy without gangrene: Secondary | ICD-10-CM | POA: Diagnosis not present

## 2019-04-24 DIAGNOSIS — B351 Tinea unguium: Secondary | ICD-10-CM | POA: Diagnosis not present

## 2019-05-01 ENCOUNTER — Encounter: Payer: Self-pay | Admitting: *Deleted

## 2019-05-01 ENCOUNTER — Other Ambulatory Visit: Payer: Self-pay | Admitting: *Deleted

## 2019-05-01 NOTE — Patient Outreach (Signed)
[place]) Care Management  [place]  05/01/2019   [name] July 27, 1945 [id number]   [place] Monthly Outreach   Referral Date:  06/24/2017 Referral Source:  MD Office Reason for Referral:  "consult for medications, too expensive especially insulin" Insurance:  Medicare   Outreach Attempt:  Successful telephone outreach to patient for follow up and introduction.  HIPAA verified with patient.  RN Health Coach introduced self and patient had another Health Coach previously.  Patient reporting she is doing well, still maintaining stay at home unless going to doctors appointments.  Endorses she has access to foods and medications.  Denies any sick days.  Continues to monitor blood sugars twice a day and managing insulin injections.  Fasting blood sugar this morning was 102 with fasting ranges of 90-110's.  Reports evening blood sugars have ranged 140-150's.  Last documented Hgb A1C was 7.7 on 04/03/2019 but patient reports A1C was reduced to 7.1 at last check.  Encounter Medications:  Outpatient Encounter Medications as of 05/01/2019  Medication Sig Note  . amLODipine (NORVASC) 5 MG tablet Take 1 tablet (5 mg total) by mouth daily.   Marland Kitchen atorvastatin (LIPITOR) 10 MG tablet Take 1 tablet (10 mg total) by mouth daily.   . carvedilol (COREG) 6.25 MG tablet Take 1 tablet (6.25 mg total) by mouth 2 (two) times daily with a meal.   . cholecalciferol (VITAMIN D) 1000 units tablet Take 1,000 Units by mouth daily.   . colchicine 0.6 MG tablet Take 0.6 mg by mouth daily as needed.   . fluticasone (FLONASE) 50 MCG/ACT nasal spray Use 2 sprays in each nostril daily 05/01/2019: Reports taking as needed  . furosemide (LASIX) 40 MG tablet Take 1 tablet (40 mg total) by mouth 2 (two) times a week.   . Insulin Glargine (BASAGLAR KWIKPEN) 100 UNIT/ML SOPN Inject 0.1 mLs (10 Units total) into the skin daily.   Marland Kitchen levothyroxine (SYNTHROID) 75 MCG tablet Take 1 tablet (75 mcg  total) by mouth daily before breakfast.   . pantoprazole (PROTONIX) 20 MG tablet Take 1 tablet (20 mg total) by mouth daily.   . Accu-Chek Softclix Lancets lancets Use as instructed 2 x daily  E11.65   . blood glucose meter kit and supplies Dispense based on patient and insurance preference. Use up to four times daily as directed. (FOR ICD-10 E10.9, E11.9).   Marland Kitchen glucose blood test strip Use as instructed up to 4x daily. E11.65   . Lancets Misc. MISC Use as directed to check blood sugar up to 4x daily. E11.65   . ULTICARE MICRO PEN NEEDLES 32G X 4 MM MISC TWICE DAILY    No facility-administered encounter medications on file as of 05/01/2019.     Functional Status:  No flowsheet data found.  Fall/Depression Screening: Fall Risk  05/01/2019 01/02/2019 11/20/2018  Falls in the past year? 0 0 0  Number falls in past yr: - - -  Injury with Fall? - - -  Comment - - -  Risk Factor Category  - - -  Risk for fall due to : Medication side effect;Impaired balance/gait;Impaired mobility - -  Follow up Falls evaluation completed;Education provided;Falls prevention discussed - -   PHQ 2/9 Scores 11/20/2018 07/28/2018 03/01/2018 10/20/2017 06/28/2017 06/24/2017 06/24/2017  PHQ - 2 Score 0 0 0 0 0 0 0  PHQ- 9 Score - - - - - - -   THN CM Care Plan Problem One     Most  Recent Value  Care Plan Problem One  Knowledge deficiet related self care management of diabetes  Role Documenting the Problem One  [name] for Problem One  Active  THN Long Term Goal   Patient will report decrease in Hgb A1C by 0.2 points in the next 90 days (current is 7.7)  THN Long Term Goal Start Date  05/01/19  Interventions for Problem One Long Term Goal  Care plan and goals reviewed and discussed, reviewed medications and encouraged medication compliance, reviewed current Hgb A1C and glucose readings and discussed ways to help reduce, discussed prevention of hypoglycemic episodes, encouraged night time snack to help prevent  hypoglycemia, encouraged healthier food and drink options, encouraged to keep and attend scheduled medical appointments, congratulated patient on keeping tract of medical maintainence appointments     Appointments:  Attended appointment with primary care provider, Dr. Lajuana Ripple on 04/03/2019.  Attended appointment with Endocrinologist, Dr. Dorris Fetch on 04/17/2019 and has follow up scheduled on 10/17/2019.  Plan: RN Health Coach will send primary care provider quarterly update. RN Health Coach will make next telephone outreach to patient within the month of February and patient agrees to upcoming outreach.  [name]

## 2019-05-31 ENCOUNTER — Telehealth: Payer: Self-pay | Admitting: Family Medicine

## 2019-06-11 ENCOUNTER — Encounter: Payer: Self-pay | Admitting: Pharmacist

## 2019-06-11 ENCOUNTER — Other Ambulatory Visit: Payer: Self-pay | Admitting: Pharmacy Technician

## 2019-06-11 NOTE — Patient Outreach (Signed)
[facility]) Care Management  06/11/2019  RICKIYA PICARIELLO Nov 16, 1945 [id number]   Incoming call received from patient in regards to wanting to speak to [place].  Provided patient phone number for the pharmacist as requested. Patient informed she was interested in re applying with [employer] for patient assistance for 2021.  Will route note to [place].  Alcario Tinkey P. Zain Lankford, [place] Management [phone]

## 2019-06-11 NOTE — Patient Outreach (Signed)
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  HPI:  Patient is a 74 year old female with the medical conditions listed above. She asked to be called about patient assistance with Basaglar 2021.  [facility] helped her with Basaglar through [employer] last year.   Objective: Allergies  Allergen Reactions  . Aspirin Nausea And Vomiting  . Ciprofloxacin Other (See Comments)    Upset Stomach  . Codeine Nausea And Vomiting    Makes me sick   . Losartan     Hyperkalemia   . Micardis [Telmisartan] Other (See Comments)    unknown  . Nexium [Esomeprazole Magnesium] Other (See Comments)    Causes internal bleeding  . Niaspan [Niacin Er] Other (See Comments)    Reaction is unknown  . Onglyza [Saxagliptin] Other (See Comments)    Reaction is unknown  . Other     No otc pain medications  . Rofecoxib Other (See Comments)    unknown  . Simvastatin   . Tequin [Gatifloxacin] Other (See Comments)    Reaction is unknown  . Welchol [Colesevelam Hcl]   . Amoxicillin Nausea And Vomiting and Rash    Has patient had a PCN reaction causing immediate rash, facial/tongue/throat swelling, SOB or lightheadedness with hypotension: Yes Has patient had a PCN reaction causing severe rash involving mucus membranes or skin necrosis: no  Has patient had a PCN reaction that required hospitalization No Has patient had a PCN reaction occurring within the last 10 years: No If all of the above answers are "NO", then may proceed with Cephalosporin use.     Medications Reviewed Today     Reviewed by [name], [facility] (Pharmacist) on 06/11/19 at 1556  Med List Status: <None>  Medication Order Taking? Sig Documenting Provider Last Dose Status Informant  Accu-Chek Softclix Lancets lancets [id number] Yes Use as instructed 2 x daily  E11.65 Cassandria Anger, MD Taking Active   amLODipine (NORVASC) 5 MG tablet [id number] Yes Take 1 tablet (5 mg total) by mouth daily. Ronnie Doss M, DO Taking Active   atorvastatin (LIPITOR) 10 MG tablet [id number] Yes Take 1 tablet (10 mg total) by mouth daily. Ronnie Doss M, DO Taking Active   blood glucose meter kit and supplies [id number] Yes Dispense based on patient and insurance preference. Use up to four times daily as directed. (FOR ICD-10 E10.9, E11.9). Ronnie Doss M, DO Taking Active   carvedilol (COREG) 6.25 MG tablet [id number] Yes Take 1 tablet (6.25 mg total) by mouth 2 (two) times daily with a meal. Ronnie Doss M, DO Taking Active   cholecalciferol (VITAMIN D) 1000 units tablet [id number] Yes Take 1,000 Units by mouth daily. [provider] Taking Active   colchicine 0.6 MG tablet [id number] Yes Take 0.6 mg by mouth daily as needed. [provider] Taking Active   fluticasone (FLONASE) 50 MCG/ACT nasal spray [id number] Yes Use 2 sprays in each nostril daily [provider] Taking Active            Med Note [place] [facility]   Tue May 01, 2019  4:04 PM) Reports taking as needed  furosemide (LASIX) 40 MG tablet [id number] Yes  Take 1 tablet (40 mg total) by mouth 2 (two) times a week. Ronnie Doss M, DO Taking Active   glucose blood test strip [id number] Yes Use as instructed up to 4x daily. E11.65 [name], DO Taking Active   Insulin Glargine [facility] KWIKPEN) 100 UNIT/ML SOPN [id number] Yes Inject 0.1 mLs (10 Units total) into the skin daily. Cassandria Anger, MD Taking Active   Lancets Misc. [place] [id number] Yes Use as directed to check blood sugar up to 4x daily. E11.65  [name], DO Taking Active   levothyroxine (SYNTHROID) 75 MCG tablet [id number] Yes Take 1 tablet (75 mcg total) by mouth daily before breakfast. Ronnie Doss M, DO Taking Active   pantoprazole (PROTONIX) 20 MG tablet [id number] Yes Take 1 tablet (20 mg total) by mouth daily. [name], DO Taking Active   ULTICARE MICRO PEN NEEDLES 32G X 4 MM MISC [id number] Yes TWICE DAILY Nida, Marella Chimes, MD Taking Active           Assessment:  Drugs sorted by system:  Cardiovascular: Amlodipine, Atorvastatin, Carvedilol, Furosemide,   Pulmonary/Allergy: Fluticasone,   Gastrointestinal: Pantoprazole  Endocrine: Basaglar, Levothyroxine  Renal: Colchicine  Vitamins/Minerals/Supplements: Cholecalciferol  Medication Review Findings:  . HgA1c-7.7% 03/2019 . On atorvastatin- last filled 04/09/2019 for a 90 day supply .   Medication Assistance Findings:  Medication assistance needs identified: Engineer, agricultural  Patient received Engineer, agricultural via [employer] Patient Assistance Program last year.   From brief financial assessment, she appears to qualify again this year. Over income for LIS/Extra Help Patient communicated understanding about the necessary paperwork required to complete her application.   Additional medication assistance options reviewed with patient as warranted:  No other options identified  Plan: I will route patient assistance letter to [place] technician who will coordinate patient assistance program application process for medications listed above.  [facility] pharmacy technician will assist with obtaining all required documents from both patient and provider(s) and submit application(s) once completed.      [name], PharmD, [place] Clinical Pharmacist [phone]

## 2019-06-12 NOTE — Telephone Encounter (Signed)
This encounter was created in error - please disregard.

## 2019-06-13 ENCOUNTER — Ambulatory Visit: Payer: Medicare Other | Attending: Internal Medicine

## 2019-06-13 DIAGNOSIS — Z23 Encounter for immunization: Secondary | ICD-10-CM | POA: Diagnosis not present

## 2019-06-13 NOTE — Progress Notes (Signed)
   Covid-19 Vaccination Clinic  Name:  [name]    MRN: [id number] DOB: 1945-09-23  06/13/2019  [name] was observed post Covid-19 immunization for 15 minutes without incidence. She was provided with Vaccine Information Sheet and instruction to access the V-Safe system.   [name] was instructed to call 911 with any severe reactions post vaccine: Marland Kitchen Difficulty breathing  . Swelling of your face and throat  . A fast heartbeat  . A bad rash all over your body  . Dizziness and weakness    Immunizations Administered    Name Date Dose VIS Date Route   Pfizer COVID-19 Vaccine 06/13/2019  1:56 PM 0.3 mL 05/04/2019 Intramuscular   Manufacturer: [place]   Lot: [id number]   [name]: [address]-3

## 2019-06-14 ENCOUNTER — Other Ambulatory Visit: Payer: Self-pay | Admitting: Pharmacy Technician

## 2019-06-14 NOTE — Patient Outreach (Signed)
[facility]) Care Management  06/14/2019  [name] 05-Mar-1946 [id number]                                        Medication Assistance Referral  Referral From: [place]  Medication/Company: [name] / [employer] Patient application portion:  Mailed Provider application portion: Faxed  to Dr. Bud Face Provider address/fax verified via: Office website  NOTE:Patient was called today per her request to inform her that the application was being mailed today. Discussed with patient the additional documentation that the patient assistance company requires. Patient verbalized understanding.   Follow up:  Will follow up with patient in 5-10 business days to confirm application(s) have been received.  [name] P. Crespin Forstrom, [place] Management [phone]

## 2019-06-21 ENCOUNTER — Other Ambulatory Visit: Payer: Self-pay | Admitting: Pharmacy Technician

## 2019-06-21 NOTE — Patient Outreach (Signed)
[place] [facility]) Care Management  06/21/2019  [name] 05-Nov-1945 [id number]    Successful call placed to patient regarding patient assistance application(s) for Basaglar with Ralph Leyden , HIPAA identifiers verified.   Called to inform patient that I received her application and submitted it today. Informed her that I mailed back her copies of the social security awards statements as she request  Follow up:  Will follow up with Lilly as previously scheduled.  Theresa Dohrman P. Genette Huertas, [place] Management [phone]

## 2019-06-21 NOTE — Patient Outreach (Signed)
[place] [facility]) Care Management  06/21/2019  [name] 1945-12-20 [id number]   Received both patient and provider portion(s) of patient assistance application(s) for Basaglar. Faxed completed application and required documents into [employer].  Will follow up with company(ies) in 7-10 business days to check status of application(s).  Tilla Wilborn P. Sherra Kimmons, [place] Management [phone]

## 2019-06-27 ENCOUNTER — Other Ambulatory Visit: Payer: Self-pay | Admitting: *Deleted

## 2019-06-27 ENCOUNTER — Encounter: Payer: Self-pay | Admitting: *Deleted

## 2019-06-27 NOTE — Patient Outreach (Signed)
[facility] ([facility]) Care Management  [facility] Care Manager  06/27/2019   [name] 12/05/1945 [id number]   RN Health Coach Monthly Outreach   Referral Date:  06/24/2017 Referral Source:  MD Office Reason for Referral:  "consult for medications, too expensive especially insulin" Insurance:  Medicare   Outreach Attempt:  Successful telephone outreach to patient for follow up.  HIPAA verified with patient.  Patient reporting she is doing well and has received her first dose of the COVID vaccine.  Denies any adverse effects and reports her second dose is scheduled.  Denies any recent falls or sick days.  Fasting blood sugar this morning was 126 with recent fasting ranges of 80-100's.  Denies any hypoglycemic or significant hyperglycemic episodes.  Encounter Medications:  Outpatient Encounter Medications as of 06/27/2019  Medication Sig Note  . amLODipine (NORVASC) 5 MG tablet Take 1 tablet (5 mg total) by mouth daily.   . atorvastatin (LIPITOR) 10 MG tablet Take 1 tablet (10 mg total) by mouth daily.   . carvedilol (COREG) 6.25 MG tablet Take 1 tablet (6.25 mg total) by mouth 2 (two) times daily with a meal.   . cholecalciferol (VITAMIN D) 1000 units tablet Take 1,000 Units by mouth daily.   . colchicine 0.6 MG tablet Take 0.6 mg by mouth daily as needed.   . fluticasone (FLONASE) 50 MCG/ACT nasal spray Use 2 sprays in each nostril daily 05/01/2019: Reports taking as needed  . furosemide (LASIX) 40 MG tablet Take 1 tablet (40 mg total) by mouth 2 (two) times a week.   . Insulin Glargine (BASAGLAR KWIKPEN) 100 UNIT/ML SOPN Inject 0.1 mLs (10 Units total) into the skin daily.   . levothyroxine (SYNTHROID) 75 MCG tablet Take 1 tablet (75 mcg total) by mouth daily before breakfast.   . pantoprazole (PROTONIX) 20 MG tablet Take 1 tablet (20 mg total) by mouth daily. 06/27/2019: Takes as needed  . Accu-Chek Softclix Lancets lancets Use as instructed 2 x daily  E11.65   . blood glucose  meter kit and supplies Dispense based on patient and insurance preference. Use up to four times daily as directed. (FOR ICD-10 E10.9, E11.9).   . glucose blood test strip Use as instructed up to 4x daily. E11.65   . Lancets Misc. MISC Use as directed to check blood sugar up to 4x daily. E11.65   . ULTICARE MICRO PEN NEEDLES 32G X 4 MM MISC TWICE DAILY    No facility-administered encounter medications on file as of 06/27/2019.    Functional Status:  In your present state of health, do you have any difficulty performing the following activities: 06/27/2019  Hearing? N  Vision? Y  Comment difficulty seeing  Difficulty concentrating or making decisions? N  Walking or climbing stairs? N  Dressing or bathing? N  Doing errands, shopping? N  Preparing Food and eating ? N  Using the Toilet? N  In the past six months, have you accidently leaked urine? N  Do you have problems with loss of bowel control? N  Managing your Medications? N  Managing your Finances? N  Housekeeping or managing your Housekeeping? N  Some recent data might be hidden    Fall/Depression Screening: Fall Risk  06/27/2019 05/01/2019 01/02/2019  Falls in the past year? 0 0 0  Number falls in past yr: 0 - -  Injury with Fall? 0 - -  Comment - - -  Risk Factor Category  - - -  Risk for fall due to :   Medication side effect;Impaired balance/gait;Impaired mobility Medication side effect;Impaired balance/gait;Impaired mobility -  Follow up Falls evaluation completed;Education provided;Falls prevention discussed Falls evaluation completed;Education provided;Falls prevention discussed -   PHQ 2/9 Scores 06/27/2019 11/20/2018 07/28/2018 03/01/2018 10/20/2017 06/28/2017 06/24/2017  PHQ - 2 Score 0 0 0 0 0 0 0  PHQ- 9 Score - - - - - - -   THN CM Care Plan Problem One     Most Recent Value  Care Plan Problem One  Knowledge deficiet related self care management of diabetes  Role Documenting the Problem One  [place] for Problem  One  Active  THN Long Term Goal   Patient will report decrease in Hgb A1C by 0.2 points in the next 90 days (current is 7.7)  [facility]N Long Term Goal Start Date  05/01/19  Interventions for Problem One Long Term Goal  Reviewed and discussed care plan and goals, reviewed current blood sugar readings and congratulated patient on ranges, encouraged to continue to monitor blood sugars, reviewed last Hgb A1C with patient and discussed ways to improve, encouraged diabetic food and drink options, reviewed medications and encouraged medication compliance, encouraged to keep and attend medical appointments, encouraged to keep second COVID vaccine appointment and reviewed signs and symptoms of adverse reaction, encouraged to continue with 3W even after vacination     Appointments:  Last attended appointment with primary care provider, Dr. Lajuana Ripple on 04/03/2019 and states she needs to scheduled an appointment for March.  States her husband has had a lot of medical appointments she has been trying to schedule her appointments around his.  Patient has Nephrology appointment on 07/09/2019.  Endocrinology appointment is scheduled for 10/17/2019.  Plan: RN Health Coach will send primary care provider quarterly update. RN Health Coach will make next telephone outreach to patient within the month of May and patient agrees to future outreach.  [place] [phone] [name]

## 2019-07-02 ENCOUNTER — Other Ambulatory Visit: Payer: Self-pay | Admitting: Pharmacy Technician

## 2019-07-02 NOTE — Patient Outreach (Signed)
[place] [facility]) Care Management  07/02/2019  [name] 02/26/1946 [id number]   Care coordination call placed to [name] in regards to patient's Basaglar applicaiton.  Spoke to [name] who informed there is no renewal application attached to the file. She also informed she checked the processing que and was unable to locate the application despite [place] having a successful confirmation. She suggested that the application be re faxed to [employer] at the normal number as well as to [phone] which she informed was for oncology products but that it would get to the correct department.  Will re fax application when in office later this week and will follow up with Lilly in 5-7 business days.  Ashaya Raftery P. Gohan Collister, [place] Management [phone]

## 2019-07-04 ENCOUNTER — Ambulatory Visit: Payer: Medicare Other | Attending: Internal Medicine

## 2019-07-04 DIAGNOSIS — Z23 Encounter for immunization: Secondary | ICD-10-CM | POA: Insufficient documentation

## 2019-07-04 NOTE — Progress Notes (Signed)
   Covid-19 Vaccination Clinic  Name:  [name]    MRN: [id number] DOB: 07-02-1945  07/04/2019  [name] was observed post Covid-19 immunization for 15 minutes without incidence. She was provided with Vaccine Information Sheet and instruction to access the V-Safe system.   [name] was instructed to call 911 with any severe reactions post vaccine: Marland Kitchen Difficulty breathing  . Swelling of your face and throat  . A fast heartbeat  . A bad rash all over your body  . Dizziness and weakness    Immunizations Administered    Name Date Dose VIS Date Route   Pfizer COVID-19 Vaccine 07/04/2019  1:47 PM 0.3 mL 05/04/2019 Intramuscular   Manufacturer: [place]   Lot: [id number]   [place]: [id number]

## 2019-07-09 DIAGNOSIS — N184 Chronic kidney disease, stage 4 (severe): Secondary | ICD-10-CM | POA: Diagnosis not present

## 2019-07-09 DIAGNOSIS — R809 Proteinuria, unspecified: Secondary | ICD-10-CM | POA: Diagnosis not present

## 2019-07-09 DIAGNOSIS — I129 Hypertensive chronic kidney disease with stage 1 through stage 4 chronic kidney disease, or unspecified chronic kidney disease: Secondary | ICD-10-CM | POA: Diagnosis not present

## 2019-07-09 DIAGNOSIS — E1122 Type 2 diabetes mellitus with diabetic chronic kidney disease: Secondary | ICD-10-CM | POA: Diagnosis not present

## 2019-07-09 DIAGNOSIS — D631 Anemia in chronic kidney disease: Secondary | ICD-10-CM | POA: Diagnosis not present

## 2019-07-09 DIAGNOSIS — M109 Gout, unspecified: Secondary | ICD-10-CM | POA: Diagnosis not present

## 2019-07-09 DIAGNOSIS — N2581 Secondary hyperparathyroidism of renal origin: Secondary | ICD-10-CM | POA: Diagnosis not present

## 2019-07-09 DIAGNOSIS — N183 Chronic kidney disease, stage 3 unspecified: Secondary | ICD-10-CM | POA: Diagnosis not present

## 2019-07-10 ENCOUNTER — Other Ambulatory Visit: Payer: Self-pay | Admitting: Pharmacy Technician

## 2019-07-10 NOTE — Patient Outreach (Signed)
[place] [facility]) Care Management  07/10/2019  [name] 05-27-1945 [id number]   Care coordination call placed to [place] in regards to patient's Basaglar application.  Spoke to Seth Bake and patient is APPROVED 07/04/2019-05/23/2020. A request was sent to pharmacy, Seth Bake advised to have patient call to set up delivery.  Will outreach patient with this information.  [name] P. Cristofer Yaffe, [place] Management [phone]

## 2019-07-10 NOTE — Patient Outreach (Signed)
[place] [facility]) Care Management  07/10/2019  [name] 22-Dec-1945 [id number]     Successful call placed to patient regarding patient assistance application(s) for Basaglar with Ralph Leyden , HIPAA identifiers verified.   Patient informed her shipment was coming today. Informed patient she was on auto shipments but that if she is down to a 2 weeks supply and has not heard about her refill then she will need to call Lilly. Patient confirmed having Lilly's phone number. Patient confirmed having my name and number. Patient verbalized understanding of refill process.  Follow up:  Will route note to [place] for case closure and will remove myself from care team.  Luiz Ochoa. Kortni Hasten, [place] Management [phone]

## 2019-07-25 ENCOUNTER — Other Ambulatory Visit: Payer: Self-pay | Admitting: Family Medicine

## 2019-07-25 ENCOUNTER — Telehealth: Payer: Self-pay | Admitting: Family Medicine

## 2019-07-25 MED ORDER — CHOLECALCIFEROL 25 MCG (1000 UT) PO CAPS: 1000.0000 [IU] | ORAL_CAPSULE | Freq: Every day | ORAL | 3 refills | Status: DC

## 2019-07-25 NOTE — Telephone Encounter (Signed)
Done

## 2019-07-25 NOTE — Telephone Encounter (Signed)
Pt has Vit D 25 mg at home, would like this sent to mail order Historical med

## 2019-07-26 NOTE — Telephone Encounter (Signed)
Pt aware refill sent to pharmacy 

## 2019-07-31 ENCOUNTER — Telehealth: Payer: Self-pay | Admitting: Family Medicine

## 2019-07-31 NOTE — Chronic Care Management (AMB) (Signed)
  Chronic Care Management   Note  07/31/2019 Name: [name] MRN: [id number] DOB: April 12, 1946  [name] is a 74 y.o. year old female who is a primary care patient of [name], DO. I reached out to [name] by phone today in response to a referral sent by [name]'s health plan.     [name] was given information about Chronic Care Management services today including:  1. CCM service includes personalized support from designated clinical staff supervised by her physician, including individualized plan of care and coordination with other care providers 2. 24/7 contact phone numbers for assistance for urgent and routine care needs. 3. Service will only be billed when office clinical staff spend 20 minutes or more in a month to coordinate care. 4. Only one practitioner may furnish and bill the service in a calendar month. 5. The patient may stop CCM services at any time (effective at the end of the month) by phone call to the office staff. 6. The patient will be responsible for cost sharing (co-pay) of up to 20% of the service fee (after annual deductible is met).  Patient did not agree to enrollment in care management services and does not wish to consider at this time.  Follow up plan: The patient has been provided with contact information for the care management team and has been advised to call with any health related questions or concerns.   Noreene Larsson, [place], [place], [place] [address] Direct Dial: [phone] [email or web address] Website: [email or web address]

## 2019-08-13 ENCOUNTER — Other Ambulatory Visit: Payer: Self-pay | Admitting: Pharmacist

## 2019-08-13 NOTE — Patient Outreach (Signed)
[place] [facility]) Care Management  08/13/2019  ROSABELL GEYER 03/11/1946 [id number]   [facility])  [place] Team    [facility] pharmacy case will be closed as our team is transitioning from the [place] Management Department into the [facility] Quality Department and will no longer be using [facility] for documentation purposes.    Patient assistance process is complete.  [name], PharmD, [place] Clinical Pharmacist [phone]

## 2019-08-14 DIAGNOSIS — B351 Tinea unguium: Secondary | ICD-10-CM | POA: Diagnosis not present

## 2019-08-14 DIAGNOSIS — L84 Corns and callosities: Secondary | ICD-10-CM | POA: Diagnosis not present

## 2019-08-14 DIAGNOSIS — E1151 Type 2 diabetes mellitus with diabetic peripheral angiopathy without gangrene: Secondary | ICD-10-CM | POA: Diagnosis not present

## 2019-08-14 DIAGNOSIS — M79676 Pain in unspecified toe(s): Secondary | ICD-10-CM | POA: Diagnosis not present

## 2019-08-15 ENCOUNTER — Ambulatory Visit: Payer: Self-pay | Admitting: Pharmacist

## 2019-08-22 DIAGNOSIS — Z6828 Body mass index (BMI) 28.0-28.9, adult: Secondary | ICD-10-CM | POA: Diagnosis not present

## 2019-08-22 DIAGNOSIS — M1A09X1 Idiopathic chronic gout, multiple sites, with tophus (tophi): Secondary | ICD-10-CM | POA: Diagnosis not present

## 2019-08-22 DIAGNOSIS — M2550 Pain in unspecified joint: Secondary | ICD-10-CM | POA: Diagnosis not present

## 2019-08-22 DIAGNOSIS — E663 Overweight: Secondary | ICD-10-CM | POA: Diagnosis not present

## 2019-08-22 DIAGNOSIS — Z79899 Other long term (current) drug therapy: Secondary | ICD-10-CM | POA: Diagnosis not present

## 2019-09-05 ENCOUNTER — Other Ambulatory Visit: Payer: Self-pay | Admitting: *Deleted

## 2019-09-05 MED ORDER — FUROSEMIDE 40 MG PO TABS: 40.0000 mg | ORAL_TABLET | ORAL | 0 refills | Status: DC

## 2019-09-10 ENCOUNTER — Other Ambulatory Visit: Payer: Self-pay | Admitting: *Deleted

## 2019-09-17 ENCOUNTER — Encounter: Payer: Self-pay | Admitting: Family Medicine

## 2019-09-17 ENCOUNTER — Ambulatory Visit (INDEPENDENT_AMBULATORY_CARE_PROVIDER_SITE_OTHER): Payer: Medicare Other | Admitting: Family Medicine

## 2019-09-17 ENCOUNTER — Other Ambulatory Visit: Payer: Self-pay

## 2019-09-17 VITALS — BP 161/65 | HR 65 | Temp 96.9°F | Ht 62.0 in | Wt 153.8 lb

## 2019-09-17 DIAGNOSIS — E1169 Type 2 diabetes mellitus with other specified complication: Secondary | ICD-10-CM

## 2019-09-17 DIAGNOSIS — E1121 Type 2 diabetes mellitus with diabetic nephropathy: Secondary | ICD-10-CM

## 2019-09-17 DIAGNOSIS — E034 Atrophy of thyroid (acquired): Secondary | ICD-10-CM

## 2019-09-17 DIAGNOSIS — E785 Hyperlipidemia, unspecified: Secondary | ICD-10-CM | POA: Diagnosis not present

## 2019-09-17 DIAGNOSIS — E1159 Type 2 diabetes mellitus with other circulatory complications: Secondary | ICD-10-CM | POA: Diagnosis not present

## 2019-09-17 DIAGNOSIS — Z794 Long term (current) use of insulin: Secondary | ICD-10-CM | POA: Diagnosis not present

## 2019-09-17 DIAGNOSIS — I10 Essential (primary) hypertension: Secondary | ICD-10-CM

## 2019-09-17 DIAGNOSIS — N1832 Chronic kidney disease, stage 3b: Secondary | ICD-10-CM

## 2019-09-17 LAB — BAYER DCA HB A1C WAIVED: HB A1C (BAYER DCA - WAIVED): 8.1 % — ABNORMAL HIGH (ref <7.0)

## 2019-09-17 MED ORDER — FUROSEMIDE 40 MG PO TABS: 40.0000 mg | ORAL_TABLET | ORAL | 0 refills | Status: DC

## 2019-09-17 MED ORDER — ATORVASTATIN CALCIUM 10 MG PO TABS: 10.0000 mg | ORAL_TABLET | Freq: Every day | ORAL | 3 refills | Status: DC

## 2019-09-17 MED ORDER — AMLODIPINE BESYLATE 5 MG PO TABS: 5.0000 mg | ORAL_TABLET | Freq: Every day | ORAL | 3 refills | Status: DC

## 2019-09-17 MED ORDER — CARVEDILOL 6.25 MG PO TABS: 6.2500 mg | ORAL_TABLET | Freq: Two times a day (BID) | ORAL | 3 refills | Status: DC

## 2019-09-17 NOTE — Progress Notes (Signed)
.   Subjective: CC: f/u DM, HTN, HLD PCP: [name], DO [name] is a 74 y.o. female presenting to clinic today for:  1. Type 2 Diabetes with hypertension, hyperlipidemia, DM retinopathy and CKD 4:  Patient reports she has really been trying to watch what she eats.  She admits that she often has to eat candy or sugary foods to keep BGs from dropping overnight.  She notes at one point she was in the ED every other week for BGs dropping into the 20-40's.  Taking medication(s): 10 units of Basaglar every morning, Lipitor 10 mg, Coreg 6.25 mg p.o. twice daily, Norvasc 5 mg daily, Side effects: None.  She sees Dr. Dorris Fetch with follow-up next week.  Asking for her labs to be forwarded.  Last eye exam: Needs Last foot exam: UTD Last A1c:   Lab Results  Component Value Date   HGBA1C 7.7 (H) 04/03/2019   Nephropathy screen indicated?: NEEDS, has allergy to ARB/ ACE-I.  She has known chronic kidney disease and is followed by renal (Dr Clover Mealy) in [place].   Last flu, zoster and/or pneumovax:  Immunization History  Administered Date(s) Administered  . Fluad Quad(high Dose 65+) 02/05/2019  . Influenza, High Dose Seasonal PF 03/05/2016, 03/14/2017, 03/01/2018  . Influenza,inj,Quad PF,6+ Mos 02/16/2013, 02/27/2015  . PFIZER SARS-COV-2 Vaccination 06/13/2019, 07/04/2019  . Pneumococcal Conjugate-13 10/11/2014  . Pneumococcal Polysaccharide-23 10/10/2012  . Tdap 10/06/2012   ROS: No chest pain, shortness of breath, falls.  No visual disturbance.  2. Hypothyroidism Doing well. Compliant with synthroid. No tremor, change in voice.  ROS: Per HPI  Allergies  Allergen Reactions  . Aspirin Nausea And Vomiting  . Ciprofloxacin Other (See Comments)    Upset Stomach  . Codeine Nausea And Vomiting    Makes me sick   . Losartan     Hyperkalemia   . Micardis [Telmisartan] Other (See Comments)    unknown  . Nexium [Esomeprazole Magnesium] Other (See Comments)    Causes  internal bleeding  . Niaspan [Niacin Er] Other (See Comments)    Reaction is unknown  . Onglyza [Saxagliptin] Other (See Comments)    Reaction is unknown  . Other     No otc pain medications  . Rofecoxib Other (See Comments)    unknown  . Simvastatin   . Tequin [Gatifloxacin] Other (See Comments)    Reaction is unknown  . Welchol [Colesevelam Hcl]   . Amoxicillin Nausea And Vomiting and Rash    Has patient had a PCN reaction causing immediate rash, facial/tongue/throat swelling, SOB or lightheadedness with hypotension: Yes Has patient had a PCN reaction causing severe rash involving mucus membranes or skin necrosis: no  Has patient had a PCN reaction that required hospitalization No Has patient had a PCN reaction occurring within the last 10 years: No If all of the above answers are "NO", then may proceed with Cephalosporin use.    Past Medical History:  Diagnosis Date  . Anal fistula   . Anemia in chronic renal disease    Aranesp injection --  when Hg <11, last injection 12-24-14.  Marland Kitchen Anxiety   . Arthritis    knees and hand/fingers. "broke back"-being evaluated for this"weakness left leg"  . Cataract    both eyes done  . 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 CKD (chronic kidney disease), stage III    nephrologist--  dr Mercy Moore-- LOV  07-09-2016  . Complication of anesthesia  post-op confusion   . Constipation   . Coronary artery disease   . Diabetic gastroparesis ([place])   . Diabetic retinopathy ([place])    bilateral --  monitored by dr [name]  . Diverticulosis of colon   . GAD (generalized anxiety disorder)   . GERD (gastroesophageal reflux disease)   . History of colon polyps    benign  . History of esophagitis   . History of GI bleed    upper 2009  due to esophagitis  &  2002  due to Mallory-Weiss tear  . History of hyperkalemia    pt had previously been canceled twice dos due to elevated K+, 07-29-2016 last date cancelled -- pt visited  pcp same day treated w/ kayexelate and K+ came down, pt brought all her medication's in to pcp office and found pt was taking losartan that had been discontinued due to ckd, pcp stated this was cause of elevated K+  . 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 History of rectal abscess    12-29-2004  bedside I & D  . Hyperlipidemia   . Hypertension   . Hypothyroidism   . LAFB (left anterior fascicular block)   . Right bundle branch block   . Sacral decubitus ulcer    since 2014- 01-02-15 remains with wound" gauze dressing changes daily.  . Type II diabetes mellitus ([place])     Current Outpatient Medications:  .  Accu-Chek Softclix Lancets lancets, Use as instructed 2 x daily  E11.65, Disp: 100 each, Rfl: 5 .  amLODipine (NORVASC) 5 MG tablet, Take 1 tablet (5 mg total) by mouth daily., Disp: 90 tablet, Rfl: 3 .  atorvastatin (LIPITOR) 10 MG tablet, Take 1 tablet (10 mg total) by mouth daily., Disp: 90 tablet, Rfl: 3 .  blood glucose meter kit and supplies, Dispense based on patient and insurance preference. Use up to four times daily as directed. (FOR ICD-10 E10.9, E11.9)., Disp: 1 each, Rfl: 0 .  carvedilol (COREG) 6.25 MG tablet, Take 1 tablet (6.25 mg total) by mouth 2 (two) times daily with a meal., Disp: 180 tablet, Rfl: 3 .  Cholecalciferol 25 MCG (1000 UT) capsule, Take 1 capsule (1,000 Units total) by mouth daily., Disp: 90 capsule, Rfl: 3 .  colchicine 0.6 MG tablet, Take 0.6 mg by mouth daily as needed., Disp: , Rfl:  .  fluticasone (FLONASE) 50 MCG/ACT nasal spray, Use 2 sprays in each nostril daily, Disp: , Rfl:  .  furosemide (LASIX) 40 MG tablet, Take 1 tablet (40 mg total) by mouth 2 (two) times a week., Disp: 30 tablet, Rfl: 0 .  glucose blood test strip, Use as instructed up to 4x daily. E11.65, Disp: 300 each, Rfl: 5 .  Insulin Glargine (BASAGLAR KWIKPEN) 100 UNIT/ML SOPN, Inject 0.1 mLs (10 Units total) into the skin daily., Disp: 15 mL, Rfl: 1 .  Lancets  Misc. MISC, Use as directed to check blood sugar up to 4x daily. E11.65, Disp: 300 each, Rfl: 3 .  levothyroxine (SYNTHROID) 75 MCG tablet, Take 1 tablet (75 mcg total) by mouth daily before breakfast., Disp: 90 tablet, Rfl: 3 .  pantoprazole (PROTONIX) 20 MG tablet, Take 1 tablet (20 mg total) by mouth daily., Disp: 90 tablet, Rfl: 3 .  ULTICARE MICRO PEN NEEDLES 32G X 4 MM MISC, TWICE DAILY, Disp: 100 each, Rfl: 5 Social History   Socioeconomic History  . Marital status: Married    Spouse name: [name]  .  Number of children: 0  . Years of education: 36  . Highest education level: Not on file  Occupational History  . Occupation: Retired    Fish farm manager: RETIRED  Tobacco Use  . Smoking status: Never Smoker  . Smokeless tobacco: Never Used  Substance and Sexual Activity  . Alcohol use: No  . Drug use: No  . Sexual activity: Not Currently  Other Topics Concern  . Not on file  Social History Narrative   Lives with husband.    Caffeine use: none   Social Determinants of Health   Financial Resource Strain: Low Risk   . Difficulty of Paying Living Expenses: Not hard at all  Food Insecurity: No Food Insecurity  . Worried About Charity fundraiser in the Last Year: Never true  . Ran Out of Food in the Last Year: Never true  Transportation Needs: No Transportation Needs  . Lack of Transportation (Medical): No  . Lack of Transportation (Non-Medical): No  Physical Activity: Inactive  . Days of Exercise per Week: 0 days  . Minutes of Exercise per Session: 0 min  Stress:   . Feeling of Stress :   Social Connections:   . Frequency of Communication with Friends and Family:   . Frequency of Social Gatherings with Friends and Family:   . Attends Religious Services:   . Active Member of Clubs or Organizations:   . Attends Archivist Meetings:   Marland Kitchen Marital Status:   Intimate Partner Violence: Not At Risk  . Fear of Current or Ex-Partner: No  . Emotionally Abused: No  . Physically  Abused: No  . Sexually Abused: No   Family History  Problem Relation Age of Onset  . Colon cancer Father 68  . Prostate cancer Father   . Diabetes Father   . Coronary artery disease Mother 14  . Heart disease Mother   . Diabetes Mother   . Coronary artery disease Sister 58  . Diabetes Sister   . Heart disease Sister   . Diabetes Sister   . Diabetes Sister   . Diabetes Sister   . Diabetes Sister   . Diabetes Sister   . Diabetes Maternal Grandmother   . Esophageal cancer Neg Hx   . Stomach cancer Neg Hx   . Rectal cancer Neg Hx     Objective: Office vital signs reviewed. BP (!) 161/65   Pulse 65   Temp (!) 96.9 F (36.1 C)   Ht '5\' 2"'  (1.575 m)   Wt 153 lb 12.8 oz (69.8 kg)   SpO2 98%   BMI 28.13 kg/m   Physical Examination:  General: Awake, alert, chronically ill appearing female, No acute distress HEENT: Normal, sclera white, MMM; no exophthalmos.  No goiter Cardio: regular rate and rhythm, S1S2 heard, no murmurs appreciated Pulm: clear to auscultation bilaterally, no wheezes, rhonchi or rales; normal work of breathing on room air Extremities: warm, well perfused, No edema, cyanosis or clubbing; +2 pulses bilaterally Skin: Normal temperature  Assessment/ Plan: 74 y.o. female   1. Type 2 diabetes mellitus with stage 3b chronic kidney disease, with long-term current use of insulin (HCC) A1c has gone up to 8.1.  I worry that she is chasing her low blood sugars and that this is why her blood sugar has gone up. - CMP14+EGFR - Bayer DCA Hb A1c Waived  2. Hyperlipidemia associated with type 2 diabetes mellitus (HCC) Check lipid panel.  Continue atorvastatin - CMP14+EGFR - Lipid Panel - atorvastatin (LIPITOR) 10  MG tablet; Take 1 tablet (10 mg total) by mouth daily.  Dispense: 90 tablet; Refill: 3  3. Hypertension associated with diabetes ([place]) Not well controlled today but she has been borderline hypotensive in the past.  Therefore no changes were made today.  Will  await recheck with her endocrinologist and if persistently elevated, may need to consider increasing the Norvasc to 10 mg daily - CMP14+EGFR - amLODipine (NORVASC) 5 MG tablet; Take 1 tablet (5 mg total) by mouth daily.  Dispense: 90 tablet; Refill: 3 - carvedilol (COREG) 6.25 MG tablet; Take 1 tablet (6.25 mg total) by mouth 2 (two) times daily with a meal.  Dispense: 180 tablet; Refill: 3  4. Hypothyroidism due to acquired atrophy of thyroid I did not refill her diabetes or thyroid medicine today.  She has follow-up with her endocrinologist next week.  Will collect labs in preparation for that appointment.  Will CC to Dr. Dorris Fetch.  Needs urine microalbumin. - Thyroid Panel With TSH   No orders of the defined types were placed in this encounter.  No orders of the defined types were placed in this encounter.    [name], DO [employer] [phone]

## 2019-09-18 LAB — CMP14+EGFR
ALT: 4 IU/L (ref 0–32)
AST: 13 IU/L (ref 0–40)
Albumin/Globulin Ratio: 1.7 (ref 1.2–2.2)
Albumin: 4.3 g/dL (ref 3.7–4.7)
Alkaline Phosphatase: 107 IU/L (ref 39–117)
BUN/Creatinine Ratio: 22 (ref 12–28)
BUN: 33 mg/dL — ABNORMAL HIGH (ref 8–27)
Bilirubin Total: 0.4 mg/dL (ref 0.0–1.2)
CO2: 19 mmol/L — ABNORMAL LOW (ref 20–29)
Calcium: 9.5 mg/dL (ref 8.7–10.3)
Chloride: 103 mmol/L (ref 96–106)
Creatinine, Ser: 1.53 mg/dL — ABNORMAL HIGH (ref 0.57–1.00)
GFR calc Af Amer: 38 mL/min/{1.73_m2} — ABNORMAL LOW (ref >59)
GFR calc non Af Amer: 33 mL/min/{1.73_m2} — ABNORMAL LOW (ref >59)
Globulin, Total: 2.6 g/dL (ref 1.5–4.5)
Glucose: 172 mg/dL — ABNORMAL HIGH (ref 65–99)
Potassium: 5.3 mmol/L — ABNORMAL HIGH (ref 3.5–5.2)
Sodium: 138 mmol/L (ref 134–144)
Total Protein: 6.9 g/dL (ref 6.0–8.5)

## 2019-09-18 LAB — LIPID PANEL
Chol/HDL Ratio: 2.5 ratio (ref 0.0–4.4)
Cholesterol, Total: 113 mg/dL (ref 100–199)
HDL: 45 mg/dL (ref >39)
LDL Chol Calc (NIH): 51 mg/dL (ref 0–99)
Triglycerides: 84 mg/dL (ref 0–149)
VLDL Cholesterol Cal: 17 mg/dL (ref 5–40)

## 2019-09-18 LAB — THYROID PANEL WITH TSH
Free Thyroxine Index: 2.6 (ref 1.2–4.9)
T3 Uptake Ratio: 30 % (ref 24–39)
T4, Total: 8.8 ug/dL (ref 4.5–12.0)
TSH: 1.54 u[IU]/mL (ref 0.450–4.500)

## 2019-09-25 ENCOUNTER — Encounter (INDEPENDENT_AMBULATORY_CARE_PROVIDER_SITE_OTHER): Payer: Medicare Other | Admitting: Ophthalmology

## 2019-10-02 ENCOUNTER — Encounter (INDEPENDENT_AMBULATORY_CARE_PROVIDER_SITE_OTHER): Payer: Self-pay | Admitting: Ophthalmology

## 2019-10-02 ENCOUNTER — Other Ambulatory Visit: Payer: Self-pay

## 2019-10-02 ENCOUNTER — Ambulatory Visit (INDEPENDENT_AMBULATORY_CARE_PROVIDER_SITE_OTHER): Payer: Medicare Other | Admitting: Ophthalmology

## 2019-10-02 ENCOUNTER — Encounter (INDEPENDENT_AMBULATORY_CARE_PROVIDER_SITE_OTHER): Payer: Medicare Other | Admitting: Ophthalmology

## 2019-10-02 DIAGNOSIS — H35043 Retinal micro-aneurysms, unspecified, bilateral: Secondary | ICD-10-CM | POA: Diagnosis not present

## 2019-10-02 DIAGNOSIS — Z794 Long term (current) use of insulin: Secondary | ICD-10-CM

## 2019-10-02 DIAGNOSIS — N183 Chronic kidney disease, stage 3 unspecified: Secondary | ICD-10-CM | POA: Insufficient documentation

## 2019-10-02 DIAGNOSIS — E113553 Type 2 diabetes mellitus with stable proliferative diabetic retinopathy, bilateral: Secondary | ICD-10-CM | POA: Diagnosis not present

## 2019-10-02 DIAGNOSIS — H353132 Nonexudative age-related macular degeneration, bilateral, intermediate dry stage: Secondary | ICD-10-CM | POA: Diagnosis not present

## 2019-10-02 DIAGNOSIS — H2701 Aphakia, right eye: Secondary | ICD-10-CM

## 2019-10-02 DIAGNOSIS — H3562 Retinal hemorrhage, left eye: Secondary | ICD-10-CM

## 2019-10-02 DIAGNOSIS — T8522XA Displacement of intraocular lens, initial encounter: Secondary | ICD-10-CM | POA: Diagnosis not present

## 2019-10-02 DIAGNOSIS — H353134 Nonexudative age-related macular degeneration, bilateral, advanced atrophic with subfoveal involvement: Secondary | ICD-10-CM | POA: Insufficient documentation

## 2019-10-02 HISTORY — DX: Aphakia, right eye: H27.01

## 2019-10-02 NOTE — Patient Instructions (Signed)
Patient was given appointment card with Dr. Katy Fitch for Monday, May 17 for IOL calculations.  She was given a card in the address.    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  Explained to the patient that the plan will be for vitrectomy, removal of current intraocular lens which is no longer useful, and placement of 3 piece intraocular lens via scleral tunnel fixation

## 2019-10-02 NOTE — Assessment & Plan Note (Signed)
The artificial lens implant inserted at time of cataract surgery, may over time,  lose the supportive structures holding the lens in proper position.   Most commonly this may occur naturally with aging, yet other causes include trauma, subsequent eye surgery, history of glaucoma, genetic factors,  and more.  Some types of IOL require removal and replacement with a different style of lens (exchange).  Some types of lenses may be retrieved at time of surgery and secured in place to recover vision.   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  Will need to schedule vitrectomy and removal of that intraocular lens and we will place a scleral fixation 3 piece lens via scleral tunnel.  We will plan for for a Zeiss CT  L UC IA three-piece IOL OD.  We will arrange for IOL calculations with [facility].

## 2019-10-02 NOTE — Progress Notes (Signed)
10/02/2019     CHIEF COMPLAINT Patient presents for Diabetic Eye Exam   HISTORY OF PRESENT ILLNESS: [name] is a 74 y.o. female who presents to the clinic today for:   HPI    Diabetic Eye Exam    Vision is blurred for near and is blurred for distance.  Associated Symptoms Floaters.  Negative for Flashes.  Diabetes characteristics include Type 2.  Blood sugar level is controlled.  Last Blood Glucose 104.  Last A1C 7.1.  I, the attending physician,  performed the HPI with the patient and updated documentation appropriately.          Comments    9 Month Diabetic Exam OU. OCT  Pt states OD vision has been blurry. Pt states this started about 2 weeks ago. Pt sees occasional floaters.       Last edited by Tilda Franco on 10/02/2019 10:14 AM. (History)      Referring physician: [name], DO [place],  [place] [address]  HISTORICAL INFORMATION:   Selected notes from the MEDICAL RECORD NUMBER    Lab Results  Component Value Date   HGBA1C 8.1 (H) 09/17/2019     CURRENT MEDICATIONS: No current outpatient medications on file. (Ophthalmic Drugs)   No current facility-administered medications for this visit. (Ophthalmic Drugs)   Current Outpatient Medications (Other)  Medication Sig  . Accu-Chek Softclix Lancets lancets Use as instructed 2 x daily  E11.65  . amLODipine (NORVASC) 5 MG tablet Take 1 tablet (5 mg total) by mouth daily.  Marland Kitchen atorvastatin (LIPITOR) 10 MG tablet Take 1 tablet (10 mg total) by mouth daily.  . blood glucose meter kit and supplies Dispense based on patient and insurance preference. Use up to four times daily as directed. (FOR ICD-10 E10.9, E11.9).  . carvedilol (COREG) 6.25 MG tablet Take 1 tablet (6.25 mg total) by mouth 2 (two) times daily with a meal.  . Cholecalciferol 25 MCG (1000 UT) capsule Take 1 capsule (1,000 Units total) by mouth daily.  . colchicine 0.6 MG tablet Take 0.6 mg by mouth daily as needed.  .  fluticasone (FLONASE) 50 MCG/ACT nasal spray Use 2 sprays in each nostril daily  . furosemide (LASIX) 40 MG tablet Take 1 tablet (40 mg total) by mouth 2 (two) times a week.  Marland Kitchen glucose blood test strip Use as instructed up to 4x daily. E11.65  . Insulin Glargine (BASAGLAR KWIKPEN) 100 UNIT/ML SOPN Inject 0.1 mLs (10 Units total) into the skin daily.  . Lancets Misc. MISC Use as directed to check blood sugar up to 4x daily. E11.65  . levothyroxine (SYNTHROID) 75 MCG tablet Take 1 tablet (75 mcg total) by mouth daily before breakfast.  . pantoprazole (PROTONIX) 20 MG tablet Take 1 tablet (20 mg total) by mouth daily.  Marland Kitchen ULTICARE MICRO PEN NEEDLES 32G X 4 MM MISC TWICE DAILY   No current facility-administered medications for this visit. (Other)      REVIEW OF SYSTEMS: ROS    Positive for: Endocrine   Last edited by Tilda Franco on 10/02/2019 10:14 AM. (History)       ALLERGIES Allergies  Allergen Reactions  . Aspirin Nausea And Vomiting  . Ciprofloxacin Other (See Comments)    Upset Stomach  . Codeine Nausea And Vomiting    Makes me sick   . Losartan     Hyperkalemia   . Micardis [Telmisartan] Other (See Comments)    unknown  . Nexium [  Esomeprazole Magnesium] Other (See Comments)    Causes internal bleeding  . Niaspan [Niacin Er] Other (See Comments)    Reaction is unknown  . Onglyza [Saxagliptin] Other (See Comments)    Reaction is unknown  . Other     No otc pain medications  . Rofecoxib Other (See Comments)    unknown  . Simvastatin   . Tequin [Gatifloxacin] Other (See Comments)    Reaction is unknown  . Welchol [Colesevelam Hcl]   . Amoxicillin Nausea And Vomiting and Rash    Has patient had a PCN reaction causing immediate rash, facial/tongue/throat swelling, SOB or lightheadedness with hypotension: Yes Has patient had a PCN reaction causing severe rash involving mucus membranes or skin necrosis: no  Has patient had a PCN reaction that required  hospitalization No Has patient had a PCN reaction occurring within the last 10 years: No If all of the above answers are "NO", then may proceed with Cephalosporin use.     PAST MEDICAL HISTORY Past Medical History:  Diagnosis Date  . Anal fistula   . Anemia in chronic renal disease    Aranesp injection --  when Hg <11, last injection 12-24-14.  Marland Kitchen Anxiety   . Arthritis    knees and hand/fingers. "broke back"-being evaluated for this"weakness left leg"  . Cataract    both eyes done  . 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 CKD (chronic kidney disease), stage III    nephrologist--  dr Mercy Moore-- LOV  07-09-2016  . Complication of anesthesia    post-op confusion   . Constipation   . Coronary artery disease   . Diabetic gastroparesis ([place])   . Diabetic retinopathy ([place])    bilateral --  monitored by dr [name]  . Diverticulosis of colon   . GAD (generalized anxiety disorder)   . GERD (gastroesophageal reflux disease)   . History of colon polyps    benign  . History of esophagitis   . History of GI bleed    upper 2009  due to esophagitis  &  2002  due to Mallory-Weiss tear  . History of hyperkalemia    pt had previously been canceled twice dos due to elevated K+, 07-29-2016 last date cancelled -- pt visited pcp same day treated w/ kayexelate and K+ came down, pt brought all her medication's in to pcp office and found pt was taking losartan that had been discontinued due to ckd, pcp stated this was cause of elevated K+  . 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 History of rectal abscess    12-29-2004  bedside I & D  . Hyperlipidemia   . Hypertension   . Hypothyroidism   . LAFB (left anterior fascicular block)   . Right bundle branch block   . Sacral decubitus ulcer    since 2014- 01-02-15 remains with wound" gauze dressing changes daily.  . Type II diabetes mellitus ([place])    Past Surgical History:  Procedure Laterality Date    . APPLICATION OF A-CELL OF EXTREMITY N/A 04/07/2015   Procedure: A CELL PLACMENT;  Surgeon: Loel Lofty Dillingham, DO;  Location: [facility];  Service: Plastics;  Laterality: N/A;  . CARDIOVASCULAR STRESS TEST  12-30-2004   normal perfusion study/  no ischemia or infartion/  normal LV wall function and wall motion , ef 66%  . CATARACT EXTRACTION W/ INTRAOCULAR LENS  IMPLANT, BILATERAL  1995  . COLONOSCOPY  2008  w/Dr.Brodie   . COLONOSCOPY W/ POLYPECTOMY  last one 2008  . COMPRESSION HIP SCREW Right 05/18/2014   Procedure: COMPRESSION HIP;  Surgeon: Carole Civil, MD;  Location: [facility];  Service: Orthopedics;  Laterality: Right;  . ESOPHAGOGASTRODUODENOSCOPY  last one 01-09-2011  . EVALUATION UNDER ANESTHESIA WITH FISTULECTOMY N/A 01/06/2015   Procedure: EXAM UNDER ANESTHESIA , placement of seton;  Surgeon: Jackolyn Confer, MD;  Location: [facility];  Service: General;  Laterality: N/A;  . I & D EXTREMITY N/A 04/07/2015   Procedure: IRRIGATION AND DEBRIDEMENT ISCHIAL ULCER;  Surgeon: Loel Lofty Dillingham, DO;  Location: [facility];  Service: Plastics;  Laterality: N/A;  . INCISION AND DRAINAGE OF WOUND N/A 09/30/2014   Procedure: IRRIGATION AND DEBRIDEMENT SACRAL WOUND, EXCISION OF PERIRECTAL TRACT WITH PLACEMENT OF ACCELL;  Surgeon: Theodoro Kos, DO;  Location: [facility];  Service: Plastics;  Laterality: N/A;  . LIGATION OF INTERNAL FISTULA TRACT N/A 10/22/2016   Procedure: LIGATION OF INTERNAL FISTULA TRACT;  Surgeon: Leighton Ruff, MD;  Location: [facility];  Service: General;  Laterality: N/A;  . ORIF FEMUR FRACTURE Left 10/09/2012   Procedure: OPEN REDUCTION INTERNAL FIXATION (ORIF) DISTAL FEMUR FRACTURE;  Surgeon: Rozanna Box, MD;  Location: [facility];  Service: Orthopedics;  Laterality: Left;  . RETINOPATHY SURGERY Bilateral 1980's?  . TRANSTHORACIC ECHOCARDIOGRAM  02-18-2011   mild LVH,  ef 55-60%,  grade I diastolic dysfunction/  mild TR/  RV systolic pressure  increased consistant with moderate pulmonary hypertension    FAMILY HISTORY Family History  Problem Relation Age of Onset  . Colon cancer Father 5  . Prostate cancer Father   . Diabetes Father   . Coronary artery disease Mother 32  . Heart disease Mother   . Diabetes Mother   . Coronary artery disease Sister 93  . Diabetes Sister   . Heart disease Sister   . Diabetes Sister   . Diabetes Sister   . Diabetes Sister   . Diabetes Sister   . Diabetes Sister   . Diabetes Maternal Grandmother   . Esophageal cancer Neg Hx   . Stomach cancer Neg Hx   . Rectal cancer Neg Hx     SOCIAL HISTORY Social History   Tobacco Use  . Smoking status: Never Smoker  . Smokeless tobacco: Never Used  Substance Use Topics  . Alcohol use: No  . Drug use: No         OPHTHALMIC EXAM:  Base Eye Exam    Visual Acuity (Snellen - Linear)      Right Left   Dist [place] CF @ 3' 20/200   Dist ph  NI NI       Tonometry (Tonopen, 10:20 AM)      Right Left   Pressure 13 12       Pupils      Pupils Dark Light Shape React APD   Right PERRL 3 3 Round Minimal None   Left PERRL 2 2 Round Minimal None       Visual Fields (Counting fingers)      Left Right   Restrictions Partial outer superior temporal, inferior temporal, superior nasal, inferior nasal deficiencies Partial outer superior temporal, inferior temporal, superior nasal, inferior nasal deficiencies       Neuro/Psych    Oriented x3: Yes   Mood/Affect: Normal       Dilation    Both eyes: 1.0% Mydriacyl, 2.5% Phenylephrine @ 10:20 AM  Slit Lamp and Fundus Exam    External Exam      Right Left   External Normal Normal       Slit Lamp Exam      Right Left   Lids/Lashes Normal    Conjunctiva/Sclera White and quiet    Cornea Clear    Anterior Chamber Deep and quiet    [place]ris Round and reactive    Lens Aphakia    Anterior Vitreous Dislocated IOL inferiorly in the bag        Fundus Exam      Right Left   Posterior  Vitreous Dislocated IOL inferiorly in the back Vitrectomized   Disc Normal Normal   C/D Ratio 0.5 0.5   Macula Normal Normal   Vessels PDR quiescent PDR quiescent   Periphery PRP 360.  attached PRP 360.   attached          IMAGING AND PROCEDURES  Imaging and Procedures for 10/02/19  OCT, Retina - OU - Both Eyes       Right Eye Quality was good. Scan locations included subfoveal. Central Foveal Thickness: 253. Progression has been stable. Findings include abnormal foveal contour, outer retinal atrophy, inner retinal atrophy.   Left Eye Quality was good. Scan locations included subfoveal. Central Foveal Thickness: 188. Progression has been stable. Findings include inner retinal atrophy, central retinal atrophy, outer retinal atrophy.        B-Scan Ultrasound - OD - Right Eye       Dislocated intraocular lens inferiorly on the pars plana                ASSESSMENT/PLAN:  Dislocated intraocular lens, initial encounter The artificial lens implant inserted at time of cataract surgery, may over time,  lose the supportive structures holding the lens in proper position.   Most commonly this may occur naturally with aging, yet other causes include trauma, subsequent eye surgery, history of glaucoma, genetic factors,  and more.  Some types of IOL require removal and replacement with a different style of lens (exchange).  Some types of lenses may be retrieved at time of surgery and secured in place to recover vision.   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  Will need to schedule vitrectomy and removal of that intraocular lens and we will place a scleral fixation 3 piece lens via scleral tunnel.  We will plan for for a Zeiss CT  L UC IA three-piece IOL OD.  We will arrange for IOL calculations with [facility].      ICD-10-CM   1. Dislocated intraocular lens, initial encounter  T85.22XA B-Scan Ultrasound - OD - Right Eye  2. Aphakia of eye, right  H27.01  B-Scan Ultrasound - OD - Right Eye  3. Controlled type 2 diabetes mellitus with stable proliferative retinopathy of both eyes, with long-term current use of insulin (HCC)  E11.3553 OCT, Retina - OU - Both Eyes   Z79.4   4. Intermediate stage nonexudative age-related macular degeneration of both eyes  H35.3132   5. Retinal hemorrhage of left eye  H35.62   6. Retinal microaneurysm of both eyes  H35.043     1.  Patient will have IOL calculations via [facility].  Attention to the right eye.  2.  We will plan vitrectomy, IOL removal and scleral tunnel fixation of 3-piece IOL OD  3.  Ophthalmic Meds Ordered this visit:  No orders of the defined types were placed in this encounter.  Return in about 2 weeks (around 10/16/2019), or Patient return for preop right eye, vitrectomy, removal IOL, scleral tunnel 3-piece IOL fixation,OD, for Surgery at [facility], [facility], OD,,,,,,,,,,, next visit, [facility].  Patient Instructions  Patient was given appointment card with Dr. Katy Fitch for Monday, May 17 for IOL calculations.  She was given a card in the address.    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  Explained to the patient that the plan will be for vitrectomy, removal of current intraocular lens which is no longer useful, and placement of 3 piece intraocular lens via scleral tunnel fixation    Explained the diagnoses, plan, and follow up with the patient and they expressed understanding.  Patient expressed understanding of the importance of proper follow up care.   Clent Demark Holly Pring M.D. Diseases & Surgery of the Retina and Vitreous Retina & Diabetic [place] 10/02/19     Abbreviations: M myopia (nearsighted); A astigmatism; H hyperopia (farsighted); P presbyopia; Mrx spectacle prescription;  CTL contact lenses; OD right eye; OS left eye; OU both eyes  XT exotropia; ET esotropia; PEK  punctate epithelial keratitis; PEE punctate epithelial erosions; DES dry eye syndrome; MGD meibomian gland dysfunction; ATs artificial tears; PFAT's preservative free artificial tears; [place] nuclear sclerotic cataract; PSC posterior subcapsular cataract; ERM epi-retinal membrane; PVD posterior vitreous detachment; RD retinal detachment; DM diabetes mellitus; DR diabetic retinopathy; NPDR non-proliferative diabetic retinopathy; PDR proliferative diabetic retinopathy; CSME clinically significant macular edema; DME diabetic macular edema; dbh dot blot hemorrhages; CWS cotton wool spot; POAG primary open angle glaucoma; C/D cup-to-disc ratio; HVF humphrey visual field; GVF goldmann visual field; OCT optical coherence tomography; IOP intraocular pressure; BRVO Branch retinal vein occlusion; CRVO central retinal vein occlusion; CRAO central retinal artery occlusion; BRAO branch retinal artery occlusion; RT retinal tear; SB scleral buckle; PPV pars plana vitrectomy; VH Vitreous hemorrhage; PRP panretinal laser photocoagulation; IVK intravitreal kenalog; VMT vitreomacular traction; MH Macular hole;  NVD neovascularization of the disc; NVE neovascularization elsewhere; AREDS age related eye disease study; ARMD age related macular degeneration; POAG primary open angle glaucoma; EBMD epithelial/anterior basement membrane dystrophy; ACIOL anterior chamber intraocular lens; IOL intraocular lens; PCIOL posterior chamber intraocular lens; Phaco/IOL phacoemulsification with intraocular lens placement; [place] photorefractive keratectomy; LASIK laser assisted in situ keratomileusis; HTN hypertension; DM diabetes mellitus; COPD chronic obstructive pulmonary disease

## 2019-10-08 DIAGNOSIS — T8529XA Other mechanical complication of intraocular lens, initial encounter: Secondary | ICD-10-CM | POA: Diagnosis not present

## 2019-10-08 DIAGNOSIS — E113553 Type 2 diabetes mellitus with stable proliferative diabetic retinopathy, bilateral: Secondary | ICD-10-CM | POA: Diagnosis not present

## 2019-10-08 DIAGNOSIS — Z961 Presence of intraocular lens: Secondary | ICD-10-CM | POA: Diagnosis not present

## 2019-10-08 DIAGNOSIS — H353132 Nonexudative age-related macular degeneration, bilateral, intermediate dry stage: Secondary | ICD-10-CM | POA: Diagnosis not present

## 2019-10-08 DIAGNOSIS — H3562 Retinal hemorrhage, left eye: Secondary | ICD-10-CM | POA: Diagnosis not present

## 2019-10-08 DIAGNOSIS — H2701 Aphakia, right eye: Secondary | ICD-10-CM | POA: Diagnosis not present

## 2019-10-09 ENCOUNTER — Encounter: Payer: Self-pay | Admitting: "Endocrinology

## 2019-10-09 ENCOUNTER — Ambulatory Visit (INDEPENDENT_AMBULATORY_CARE_PROVIDER_SITE_OTHER): Payer: Medicare Other | Admitting: "Endocrinology

## 2019-10-09 ENCOUNTER — Other Ambulatory Visit: Payer: Self-pay

## 2019-10-09 ENCOUNTER — Ambulatory Visit: Payer: Medicare Other | Admitting: "Endocrinology

## 2019-10-09 VITALS — BP 184/64 | HR 67 | Ht 62.0 in | Wt 153.6 lb

## 2019-10-09 DIAGNOSIS — E1121 Type 2 diabetes mellitus with diabetic nephropathy: Secondary | ICD-10-CM | POA: Diagnosis not present

## 2019-10-09 DIAGNOSIS — E039 Hypothyroidism, unspecified: Secondary | ICD-10-CM | POA: Diagnosis not present

## 2019-10-09 DIAGNOSIS — N1832 Chronic kidney disease, stage 3b: Secondary | ICD-10-CM | POA: Diagnosis not present

## 2019-10-09 DIAGNOSIS — I10 Essential (primary) hypertension: Secondary | ICD-10-CM

## 2019-10-09 DIAGNOSIS — Z794 Long term (current) use of insulin: Secondary | ICD-10-CM | POA: Diagnosis not present

## 2019-10-09 DIAGNOSIS — E782 Mixed hyperlipidemia: Secondary | ICD-10-CM

## 2019-10-09 MED ORDER — GLIPIZIDE ER 2.5 MG PO TB24: 2.5000 mg | ORAL_TABLET | Freq: Every day | ORAL | 1 refills | Status: DC

## 2019-10-09 MED ORDER — GLIPIZIDE ER 2.5 MG PO TB24
2.5000 mg | ORAL_TABLET | Freq: Every day | ORAL | 0 refills | Status: DC
Start: 2019-10-09 — End: 2019-10-09

## 2019-10-09 NOTE — Progress Notes (Signed)
10/09/2019   Endocrinology follow-up note    Subjective:    Patient ID: [name], female    DOB: March 18, 1946.  She is being seen in follow-up in the management of uncontrolled type 2 diabetes, hypothyroidism, hypertension, hyperlipidemia.  Past Medical History:  Diagnosis Date  . Anal fistula   . Anemia in chronic renal disease    Aranesp injection --  when Hg <11, last injection 12-24-14.  Marland Kitchen Anxiety   . Arthritis    knees and hand/fingers. "broke back"-being evaluated for this"weakness left leg"  . Cataract    both eyes done  . 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 CKD (chronic kidney disease), stage III    nephrologist--  dr Mercy Moore-- LOV  07-09-2016  . Complication of anesthesia    post-op confusion   . Constipation   . Coronary artery disease   . Diabetic gastroparesis ([place])   . Diabetic retinopathy ([place])    bilateral --  monitored by dr [name]  . Diverticulosis of colon   . GAD (generalized anxiety disorder)   . GERD (gastroesophageal reflux disease)   . History of colon polyps    benign  . History of esophagitis   . History of GI bleed    upper 2009  due to esophagitis  &  2002  due to Mallory-Weiss tear  . History of hyperkalemia    pt had previously been canceled twice dos due to elevated K+, 07-29-2016 last date cancelled -- pt visited pcp same day treated w/ kayexelate and K+ came down, pt brought all her medication's in to pcp office and found pt was taking losartan that had been discontinued due to ckd, pcp stated this was cause of elevated K+  . 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 History of rectal abscess    12-29-2004  bedside I & D  . Hyperlipidemia   . Hypertension   . Hypothyroidism   . LAFB (left anterior fascicular block)   . Right bundle branch block   . Sacral decubitus ulcer    since 2014- 01-02-15 remains with wound" gauze dressing changes daily.  . Type II diabetes mellitus ([place])     Past Surgical History:  Procedure Laterality Date  . APPLICATION OF A-CELL OF EXTREMITY N/A 04/07/2015   Procedure: A CELL PLACMENT;  Surgeon: Loel Lofty Dillingham, DO;  Location: [facility];  Service: Plastics;  Laterality: N/A;  . CARDIOVASCULAR STRESS TEST  12-30-2004   normal perfusion study/  no ischemia or infartion/  normal LV wall function and wall motion , ef 66%  . CATARACT EXTRACTION W/ INTRAOCULAR LENS  IMPLANT, BILATERAL  1995  . COLONOSCOPY  2008   w/Dr.Brodie   . COLONOSCOPY W/ POLYPECTOMY  last one 2008  . COMPRESSION HIP SCREW Right 05/18/2014   Procedure: COMPRESSION HIP;  Surgeon: Carole Civil, MD;  Location: [facility];  Service: Orthopedics;  Laterality: Right;  . ESOPHAGOGASTRODUODENOSCOPY  last one 01-09-2011  . EVALUATION UNDER ANESTHESIA WITH FISTULECTOMY N/A 01/06/2015   Procedure: EXAM UNDER ANESTHESIA , placement of seton;  Surgeon: Jackolyn Confer, MD;  Location: [facility];  Service: General;  Laterality: N/A;  . I & D EXTREMITY N/A 04/07/2015   Procedure: IRRIGATION AND DEBRIDEMENT ISCHIAL ULCER;  Surgeon: Loel Lofty Dillingham, DO;  Location: [facility];  Service: Plastics;  Laterality: N/A;  . INCISION AND DRAINAGE OF WOUND N/A 09/30/2014   Procedure: IRRIGATION AND DEBRIDEMENT SACRAL  WOUND, EXCISION OF PERIRECTAL TRACT WITH PLACEMENT OF ACCELL;  Surgeon: Theodoro Kos, DO;  Location: [facility];  Service: Plastics;  Laterality: N/A;  . LIGATION OF INTERNAL FISTULA TRACT N/A 10/22/2016   Procedure: LIGATION OF INTERNAL FISTULA TRACT;  Surgeon: Leighton Ruff, MD;  Location: [facility];  Service: General;  Laterality: N/A;  . ORIF FEMUR FRACTURE Left 10/09/2012   Procedure: OPEN REDUCTION INTERNAL FIXATION (ORIF) DISTAL FEMUR FRACTURE;  Surgeon: Rozanna Box, MD;  Location: [facility];  Service: Orthopedics;  Laterality: Left;  . RETINOPATHY SURGERY Bilateral 1980's?  . TRANSTHORACIC ECHOCARDIOGRAM  02-18-2011   mild LVH,  ef 55-60%,  grade I  diastolic dysfunction/  mild TR/  RV systolic pressure increased consistant with moderate pulmonary hypertension   Social History   Socioeconomic History  . Marital status: Married    Spouse name: [name]  . Number of children: 0  . Years of education: 20  . Highest education level: Not on file  Occupational History  . Occupation: Retired    Fish farm manager: RETIRED  Tobacco Use  . Smoking status: Never Smoker  . Smokeless tobacco: Never Used  Substance and Sexual Activity  . Alcohol use: No  . Drug use: No  . Sexual activity: Not Currently  Other Topics Concern  . Not on file  Social History Narrative   Lives with husband.    Caffeine use: none   Social Determinants of Health   Financial Resource Strain: Low Risk   . Difficulty of Paying Living Expenses: Not hard at all  Food Insecurity: No Food Insecurity  . Worried About Charity fundraiser in the Last Year: Never true  . Ran Out of Food in the Last Year: Never true  Transportation Needs: No Transportation Needs  . Lack of Transportation (Medical): No  . Lack of Transportation (Non-Medical): No  Physical Activity: Inactive  . Days of Exercise per Week: 0 days  . Minutes of Exercise per Session: 0 min  Stress:   . Feeling of Stress :   Social Connections:   . Frequency of Communication with Friends and Family:   . Frequency of Social Gatherings with Friends and Family:   . Attends Religious Services:   . Active Member of Clubs or Organizations:   . Attends Archivist Meetings:   Marland Kitchen Marital Status:    Outpatient Encounter Medications as of 10/09/2019  Medication Sig  . Accu-Chek Softclix Lancets lancets Use as instructed 2 x daily  E11.65  . amLODipine (NORVASC) 5 MG tablet Take 1 tablet (5 mg total) by mouth daily.  Marland Kitchen atorvastatin (LIPITOR) 10 MG tablet Take 1 tablet (10 mg total) by mouth daily.  . blood glucose meter kit and supplies Dispense based on patient and insurance preference. Use up to four times daily  as directed. (FOR ICD-10 E10.9, E11.9).  . carvedilol (COREG) 6.25 MG tablet Take 1 tablet (6.25 mg total) by mouth 2 (two) times daily with a meal.  . Cholecalciferol 25 MCG (1000 UT) capsule Take 1 capsule (1,000 Units total) by mouth daily.  . colchicine 0.6 MG tablet Take 0.6 mg by mouth daily as needed.  . fluticasone (FLONASE) 50 MCG/ACT nasal spray Use 2 sprays in each nostril daily  . furosemide (LASIX) 40 MG tablet Take 1 tablet (40 mg total) by mouth 2 (two) times a week.  Marland Kitchen glipiZIDE (GLUCOTROL XL) 2.5 MG 24 hr tablet Take 1 tablet (2.5 mg total) by mouth daily with breakfast.  .  glucose blood test strip Use as instructed up to 4x daily. E11.65  . Insulin Glargine (BASAGLAR KWIKPEN) 100 UNIT/ML SOPN Inject 0.1 mLs (10 Units total) into the skin daily.  . Lancets Misc. MISC Use as directed to check blood sugar up to 4x daily. E11.65  . levothyroxine (SYNTHROID) 75 MCG tablet Take 1 tablet (75 mcg total) by mouth daily before breakfast.  . pantoprazole (PROTONIX) 20 MG tablet Take 1 tablet (20 mg total) by mouth daily.  Marland Kitchen ULTICARE MICRO PEN NEEDLES 32G X 4 MM MISC TWICE DAILY  . [DISCONTINUED] glipiZIDE (GLUCOTROL XL) 2.5 MG 24 hr tablet Take 1 tablet (2.5 mg total) by mouth daily with breakfast.   No facility-administered encounter medications on file as of 10/09/2019.   ALLERGIES: Allergies  Allergen Reactions  . Aspirin Nausea And Vomiting  . Ciprofloxacin Other (See Comments)    Upset Stomach  . Codeine Nausea And Vomiting    Makes me sick   . Losartan     Hyperkalemia   . Micardis [Telmisartan] Other (See Comments)    unknown  . Nexium [Esomeprazole Magnesium] Other (See Comments)    Causes internal bleeding  . Niaspan [Niacin Er] Other (See Comments)    Reaction is unknown  . Onglyza [Saxagliptin] Other (See Comments)    Reaction is unknown  . Other     No otc pain medications  . Rofecoxib Other (See Comments)    unknown  . Simvastatin   . Tequin [Gatifloxacin]  Other (See Comments)    Reaction is unknown  . Welchol [Colesevelam Hcl]   . Amoxicillin Nausea And Vomiting and Rash    Has patient had a PCN reaction causing immediate rash, facial/tongue/throat swelling, SOB or lightheadedness with hypotension: Yes Has patient had a PCN reaction causing severe rash involving mucus membranes or skin necrosis: no  Has patient had a PCN reaction that required hospitalization No Has patient had a PCN reaction occurring within the last 10 years: No If all of the above answers are "NO", then may proceed with Cephalosporin use.    VACCINATION STATUS: Immunization History  Administered Date(s) Administered  . Fluad Quad(high Dose 65+) 02/05/2019  . Influenza, High Dose Seasonal PF 03/05/2016, 03/14/2017, 03/01/2018  . Influenza,inj,Quad PF,6+ Mos 02/16/2013, 02/27/2015  . PFIZER SARS-COV-2 Vaccination 06/13/2019, 07/04/2019  . Pneumococcal Conjugate-13 10/11/2014  . Pneumococcal Polysaccharide-23 10/10/2012  . Tdap 10/06/2012    Diabetes She presents for her follow-up diabetic visit. She has type 2 diabetes mellitus. Onset time: She was diagnosed at approximate age of 56 years . Her disease course has been fluctuating. There are no hypoglycemic associated symptoms. Pertinent negatives for hypoglycemia include no confusion, headaches, pallor or seizures. Pertinent negatives for diabetes include no chest pain, no fatigue, no polydipsia, no polyphagia and no visual change. There are no hypoglycemic complications. Symptoms are improving. Diabetic complications include nephropathy, peripheral neuropathy, PVD and retinopathy. Risk factors for coronary artery disease include diabetes mellitus, dyslipidemia, hypertension and sedentary lifestyle. Current diabetic treatment includes oral agent (monotherapy). She is compliant with treatment most of the time. Her weight is fluctuating minimally. She is following a generally unhealthy diet. When asked about meal planning, she  reported none. She has not had a previous visit with a dietitian. She never participates in exercise. Her home blood glucose trend is fluctuating minimally. Her breakfast blood glucose range is generally 130-140 mg/dl. Her bedtime blood glucose range is generally 130-140 mg/dl. Her overall blood glucose range is 130-140 mg/dl. An ACE inhibitor/angiotensin II  receptor blocker is being taken. Eye exam is current.  Thyroid Problem Presents for initial visit. Onset time: 15 years. Patient reports no cold intolerance, diarrhea, fatigue, heat intolerance, palpitations or visual change. The symptoms have been stable. Past treatments include levothyroxine. The following procedures have not been performed: thyroidectomy.  Hypertension This is a chronic problem. The current episode started more than 1 year ago. Pertinent negatives include no chest pain, headaches, palpitations or shortness of breath. Past treatments include angiotensin blockers. Hypertensive end-organ damage includes PVD and retinopathy. Identifiable causes of hypertension include a thyroid problem.    Review of systems  Constitutional: + Minimally fluctuating body weight,  current  Body mass index is 28.09 kg/m. , no fatigue, no subjective hyperthermia, no subjective hypothermia Eyes: no blurry vision, no xerophthalmia ENT: no sore throat, no nodules palpated in throat, no dysphagia/odynophagia, no hoarseness Cardiovascular: no Chest Pain, no Shortness of Breath, no palpitations, no leg swelling Respiratory: no cough, no shortness of breath Gastrointestinal: no Nausea/Vomiting/Diarhhea Musculoskeletal: no muscle/joint aches Skin: no rashes, no hyperemia Neurological: no tremors, no numbness, no tingling, no dizziness Psychiatric: no depression, no anxiety   Objective:    BP (!) 184/64   Pulse 67   Ht '5\' 2"'  (1.575 m)   Wt 153 lb 9.6 oz (69.7 kg)   BMI 28.09 kg/m   Wt Readings from Last 3 Encounters:  10/09/19 153 lb 9.6 oz (69.7  kg)  09/17/19 153 lb 12.8 oz (69.8 kg)  04/03/19 152 lb (68.9 kg)     Physical Exam- Limited  Constitutional:  Body mass index is 28.09 kg/m. , not in acute distress, normal state of mind Eyes:  EOMI, no exophthalmos Neck: Supple Thyroid: No gross goiter Respiratory: Adequate breathing efforts Musculoskeletal:  + Ambulates with a walker due to disequilibrium, arthritic changes on bilateral upper extremities.    Skin:  no rashes, no hyperemia Neurological: no tremor with outstretched hands,    Results for orders placed or performed in visit on 09/24/19  [facility] DIABETES EYE EXAM  Result Value Ref Range   HM Diabetic Eye Exam Retinopathy (A) No Retinopathy   Diabetic Labs (most recent): Lab Results  Component Value Date   HGBA1C 8.1 (H) 09/17/2019   HGBA1C 7.7 (H) 04/03/2019   HGBA1C 7.7 (H) 11/20/2018   Lipid Panel     Component Value Date/Time   CHOL 113 09/17/2019 0955   CHOL 105 09/11/2012 1057   TRIG 84 09/17/2019 0955   TRIG 170 (H) 10/11/2014 1159   TRIG 79 09/11/2012 1057   HDL 45 09/17/2019 0955   HDL 53 10/11/2014 1159   HDL 41 09/11/2012 1057   CHOLHDL 2.5 09/17/2019 0955   LDLCALC 51 09/17/2019 0955   LDLCALC 50 02/14/2014 1012   LDLCALC 48 09/11/2012 1057     Assessment & Plan:   1. Type 2 diabetes mellitus with stage 4 chronic kidney disease, without long-term current use of insulin ([place])  - Patient has currently controlled asymptomatic type 2 DM since  74 years of age. -She presents with controlled fasting glycemic profile, above target postprandial readings.  Her point-of-care A1c is 8.1% increasing from 7.7% during her last visit.      -her  diabetes is complicated by PAD, CKD, neuropathy and patient remains at a high risk for more acute and chronic complications of diabetes which include CAD, CVA, CKD, retinopathy, and neuropathy. These are all discussed in detail with the patient.  - I have counseled the patient on diet management  by adopting a  carbohydrate restricted/protein rich diet.  - she  admits there is a room for improvement in her diet and drink choices. -  Suggestion is made for her to avoid simple carbohydrates  from her diet including Cakes, Sweet Desserts / Pastries, Ice Cream, Soda (diet and regular), Sweet Tea, Candies, Chips, Cookies, Sweet Pastries,  Store Bought Juices, Alcohol in Excess of  1-2 drinks a day, Artificial Sweeteners, Coffee Creamer, and "Sugar-free" Products. This will help patient to have stable blood glucose profile and potentially avoid unintended weight gain.  - I encouraged the patient to switch to  unprocessed or minimally processed complex starch and increased protein intake (animal or plant source), fruits, and vegetables.  - Patient is advised to stick to a routine mealtimes to eat 3 meals  a day and avoid unnecessary snacks ( to snack only to correct hypoglycemia).   - I have approached patient with the following individualized plan to manage diabetes and patient agrees:    -Her husband, Ludwig Clarks , continues to offer to help. -She is advised to continue Basaglar  10 units every morning with breakfast, associated with strict monitoring of blood glucose 2 times a day-before breakfast and daily at bedtime. - First priority in this patient will be to avoid hypoglycemia.  She would benefit from a low-dose glipizide to help her control postprandial glycemia.  I discussed and added glipizide 2.5 mg XL p.o. daily at breakfast.  -Patient is encouraged to call clinic for blood glucose levels less than 70 or above 300 mg /dl.  -Patient is not a candidate for MTF, Incretin therapy.  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    - Patient specific target  A1c;  LDL, HDL, Triglycerides,  were discussed in detail.  2) BP/HTN:  Her blood pressure is not controlled to target.   She is advised to continue her current blood pressure medications including  carvedilol and amlodipine.   Has documented allergy  to ARB.  3) Lipids/HPL: Her recent lipid panel showed significant improvement in her LDL to 51 from 89.    She will continue to benefit from statin treatment, advised to continue atorvastatin 10 mg p.o. nightly.    4)  Weight/Diet: Her BMI is 28-may benefit from some weight loss.  CDE Consult has been initiated, she has limited ability to exercise.  5)  Hypothyroidism:  - Her current thyroid function tests are consistent with appropriate replacement. -She is advised to continue levothyroxine 75 mcg p.o. daily before breakfast.     - We discussed about the correct intake of her thyroid hormone, on empty stomach at fasting, with water, separated by at least 30 minutes from breakfast and other medications,  and separated by more than 4 hours from calcium, iron, multivitamins, acid reflux medications (PPIs). -Patient is made aware of the fact that thyroid hormone replacement is needed for life, dose to be adjusted by periodic monitoring of thyroid function tests.   5) Chronic Care/Health Maintenance:  -Patient is  on ACEI/ARB and Statin medications and encouraged to continue to follow up with Ophthalmology, Podiatrist at least yearly or according to recommendations, and advised to   stay away from smoking. I have recommended yearly flu vaccine and pneumonia vaccination at least every 5 years;  and  sleep for at least 7 hours a day.  - I advised patient to maintain close follow up with [name], DO for primary care needs.  - Time spent on this patient care  encounter:  35 min, of which > 50% was spent in  counseling and the rest reviewing her blood glucose logs , discussing her hypoglycemia and hyperglycemia episodes, reviewing her current and  previous labs / studies  ( including abstraction from other facilities) and medications  doses and developing a  long term treatment plan and documenting her care.   Please refer to Patient Instructions for Blood Glucose Monitoring and  Insulin/Medications Dosing Guide"  in media tab for additional information. Please  also refer to " Patient Self Inventory" in the Media  tab for reviewed elements of pertinent patient history.  [name] participated in the discussions, expressed understanding, and voiced agreement with the above plans.  All questions were answered to her satisfaction. she is encouraged to contact clinic should she have any questions or concerns prior to her return visit.      Follow up plan: - Return in about 4 months (around 02/09/2020) for Bring Meter and Logs- A1c in Office.  Glade Lloyd, MD Phone: [phone]  Fax: [phone]  -  This note was partially dictated with voice recognition software. Similar sounding words can be transcribed inadequately or may not  be corrected upon review.  10/09/2019, 4:15 PM

## 2019-10-17 ENCOUNTER — Ambulatory Visit (INDEPENDENT_AMBULATORY_CARE_PROVIDER_SITE_OTHER): Payer: Medicare Other

## 2019-10-17 ENCOUNTER — Ambulatory Visit: Payer: Medicare Other | Admitting: "Endocrinology

## 2019-10-17 ENCOUNTER — Encounter (INDEPENDENT_AMBULATORY_CARE_PROVIDER_SITE_OTHER): Payer: Self-pay

## 2019-10-17 ENCOUNTER — Other Ambulatory Visit: Payer: Self-pay

## 2019-10-17 DIAGNOSIS — E113551 Type 2 diabetes mellitus with stable proliferative diabetic retinopathy, right eye: Secondary | ICD-10-CM

## 2019-10-17 DIAGNOSIS — T8522XA Displacement of intraocular lens, initial encounter: Secondary | ICD-10-CM | POA: Diagnosis not present

## 2019-10-17 MED ORDER — PREDNISOLONE ACETATE 1 % OP SUSP
1.0000 [drp] | Freq: Four times a day (QID) | OPHTHALMIC | 0 refills | Status: DC
Start: 2019-10-17 — End: 2024-02-22

## 2019-10-17 MED ORDER — TOBRAMYCIN 0.3 % OP SOLN
1.0000 [drp] | Freq: Four times a day (QID) | OPHTHALMIC | 0 refills | Status: AC
Start: 2019-10-17 — End: 2019-11-14

## 2019-10-17 NOTE — Progress Notes (Addendum)
10/17/2019     CHIEF COMPLAINT Patient presents for Pre-op Exam   HISTORY OF PRESENT ILLNESS: [name] is a 74 y.o. female who presents to the clinic today for:   HPI    Pre Op OD for 10/31/2019 sx PPV/removal of dislocated IOL/scleral tunnel fixation of 3-piece IOL OD   Last edited by Rockie Neighbours, [place] on 10/17/2019  3:15 PM. (History)        HISTORICAL INFORMATION:   Selected notes from the MEDICAL RECORD NUMBER    Lab Results  Component Value Date   HGBA1C 8.1 (H) 09/17/2019     CURRENT MEDICATIONS: Current Outpatient Medications (Ophthalmic Drugs)  Medication Sig  . prednisoLONE acetate (PRED FORTE) 1 % ophthalmic suspension Place 1 drop into the right eye 4 (four) times daily.  Marland Kitchen tobramycin (TOBREX) 0.3 % ophthalmic solution Place 1 drop into the right eye in the morning, at noon, in the evening, and at bedtime for 28 days.   No current facility-administered medications for this visit. (Ophthalmic Drugs)   Current Outpatient Medications (Other)  Medication Sig  . Accu-Chek Softclix Lancets lancets Use as instructed 2 x daily  E11.65  . amLODipine (NORVASC) 5 MG tablet Take 1 tablet (5 mg total) by mouth daily.  Marland Kitchen atorvastatin (LIPITOR) 10 MG tablet Take 1 tablet (10 mg total) by mouth daily.  . blood glucose meter kit and supplies Dispense based on patient and insurance preference. Use up to four times daily as directed. (FOR ICD-10 E10.9, E11.9).  . carvedilol (COREG) 6.25 MG tablet Take 1 tablet (6.25 mg total) by mouth 2 (two) times daily with a meal.  . Cholecalciferol 25 MCG (1000 UT) capsule Take 1 capsule (1,000 Units total) by mouth daily.  . colchicine 0.6 MG tablet Take 0.6 mg by mouth daily as needed.  . fluticasone (FLONASE) 50 MCG/ACT nasal spray Use 2 sprays in each nostril daily  . furosemide (LASIX) 40 MG tablet Take 1 tablet (40 mg total) by mouth 2 (two) times a week.  Marland Kitchen glipiZIDE (GLUCOTROL XL) 2.5 MG 24 hr tablet Take 1 tablet (2.5 mg  total) by mouth daily with breakfast.  . glucose blood test strip Use as instructed up to 4x daily. E11.65  . Insulin Glargine (BASAGLAR KWIKPEN) 100 UNIT/ML SOPN Inject 0.1 mLs (10 Units total) into the skin daily.  . Lancets Misc. MISC Use as directed to check blood sugar up to 4x daily. E11.65  . levothyroxine (SYNTHROID) 75 MCG tablet Take 1 tablet (75 mcg total) by mouth daily before breakfast.  . pantoprazole (PROTONIX) 20 MG tablet Take 1 tablet (20 mg total) by mouth daily.  Marland Kitchen ULTICARE MICRO PEN NEEDLES 32G X 4 MM MISC TWICE DAILY   No current facility-administered medications for this visit. (Other)     ALLERGIES Allergies  Allergen Reactions  . Aspirin Nausea And Vomiting  . Ciprofloxacin Other (See Comments)    Upset Stomach  . Codeine Nausea And Vomiting    Makes me sick   . Losartan     Hyperkalemia   . Micardis [Telmisartan] Other (See Comments)    unknown  . Nexium [Esomeprazole Magnesium] Other (See Comments)    Causes internal bleeding  . Niaspan [Niacin Er] Other (See Comments)    Reaction is unknown  . Onglyza [Saxagliptin] Other (See Comments)    Reaction is unknown  . Other     No otc pain medications  . Rofecoxib Other (See Comments)    unknown  .  Simvastatin   . Tequin [Gatifloxacin] Other (See Comments)    Reaction is unknown  . Welchol [Colesevelam Hcl]   . Amoxicillin Nausea And Vomiting and Rash    Has patient had a PCN reaction causing immediate rash, facial/tongue/throat swelling, SOB or lightheadedness with hypotension: Yes Has patient had a PCN reaction causing severe rash involving mucus membranes or skin necrosis: no  Has patient had a PCN reaction that required hospitalization No Has patient had a PCN reaction occurring within the last 10 years: No If all of the above answers are "NO", then may proceed with Cephalosporin use.     PAST MEDICAL HISTORY Past Medical History:  Diagnosis Date  . Anal fistula   . Anemia in chronic renal  disease    Aranesp injection --  when Hg <11, last injection 12-24-14.  Marland Kitchen Anxiety   . Arthritis    knees and hand/fingers. "broke back"-being evaluated for this"weakness left leg"  . Cataract    both eyes done  . 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 CKD (chronic kidney disease), stage III    nephrologist--  dr Mercy Moore-- LOV  07-09-2016  . Complication of anesthesia    post-op confusion   . Constipation   . Coronary artery disease   . Diabetic gastroparesis ([place])   . Diabetic retinopathy ([place])    bilateral --  monitored by dr [name]  . Diverticulosis of colon   . GAD (generalized anxiety disorder)   . GERD (gastroesophageal reflux disease)   . History of colon polyps    benign  . History of esophagitis   . History of GI bleed    upper 2009  due to esophagitis  &  2002  due to Mallory-Weiss tear  . History of hyperkalemia    pt had previously been canceled twice dos due to elevated K+, 07-29-2016 last date cancelled -- pt visited pcp same day treated w/ kayexelate and K+ came down, pt brought all her medication's in to pcp office and found pt was taking losartan that had been discontinued due to ckd, pcp stated this was cause of elevated K+  . 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 History of rectal abscess    12-29-2004  bedside I & D  . Hyperlipidemia   . Hypertension   . Hypothyroidism   . LAFB (left anterior fascicular block)   . Right bundle branch block   . Sacral decubitus ulcer    since 2014- 01-02-15 remains with wound" gauze dressing changes daily.  . Type II diabetes mellitus ([place])    Past Surgical History:  Procedure Laterality Date  . APPLICATION OF A-CELL OF EXTREMITY N/A 04/07/2015   Procedure: A CELL PLACMENT;  Surgeon: Loel Lofty Dillingham, DO;  Location: [place];  Service: Plastics;  Laterality: N/A;  . CARDIOVASCULAR STRESS TEST  12-30-2004   normal perfusion study/  no ischemia or infartion/  normal LV  wall function and wall motion , ef 66%  . CATARACT EXTRACTION W/ INTRAOCULAR LENS  IMPLANT, BILATERAL  1995  . COLONOSCOPY  2008   w/Dr.Brodie   . COLONOSCOPY W/ POLYPECTOMY  last one 2008  . COMPRESSION HIP SCREW Right 05/18/2014   Procedure: COMPRESSION HIP;  Surgeon: Carole Civil, MD;  Location: [facility];  Service: Orthopedics;  Laterality: Right;  . ESOPHAGOGASTRODUODENOSCOPY  last one 01-09-2011  . EVALUATION UNDER ANESTHESIA WITH FISTULECTOMY N/A 01/06/2015   Procedure: EXAM UNDER ANESTHESIA ,  placement of seton;  Surgeon: Jackolyn Confer, MD;  Location: [facility];  Service: General;  Laterality: N/A;  . I & D EXTREMITY N/A 04/07/2015   Procedure: IRRIGATION AND DEBRIDEMENT ISCHIAL ULCER;  Surgeon: Loel Lofty Dillingham, DO;  Location: [facility];  Service: Plastics;  Laterality: N/A;  . INCISION AND DRAINAGE OF WOUND N/A 09/30/2014   Procedure: IRRIGATION AND DEBRIDEMENT SACRAL WOUND, EXCISION OF PERIRECTAL TRACT WITH PLACEMENT OF ACCELL;  Surgeon: Theodoro Kos, DO;  Location: [place];  Service: Plastics;  Laterality: N/A;  . LIGATION OF INTERNAL FISTULA TRACT N/A 10/22/2016   Procedure: LIGATION OF INTERNAL FISTULA TRACT;  Surgeon: Leighton Ruff, MD;  Location: [facility];  Service: General;  Laterality: N/A;  . ORIF FEMUR FRACTURE Left 10/09/2012   Procedure: OPEN REDUCTION INTERNAL FIXATION (ORIF) DISTAL FEMUR FRACTURE;  Surgeon: Rozanna Box, MD;  Location: [facility];  Service: Orthopedics;  Laterality: Left;  . RETINOPATHY SURGERY Bilateral 1980's?  . TRANSTHORACIC ECHOCARDIOGRAM  02-18-2011   mild LVH,  ef 55-60%,  grade I diastolic dysfunction/  mild TR/  RV systolic pressure increased consistant with moderate pulmonary hypertension    FAMILY HISTORY Family History  Problem Relation Age of Onset  . Colon cancer Father 68  . Prostate cancer Father   . Diabetes Father   . Coronary artery disease Mother 22  . Heart disease Mother   . Diabetes Mother     . Coronary artery disease Sister 72  . Diabetes Sister   . Heart disease Sister   . Diabetes Sister   . Diabetes Sister   . Diabetes Sister   . Diabetes Sister   . Diabetes Sister   . Diabetes Maternal Grandmother   . Esophageal cancer Neg Hx   . Stomach cancer Neg Hx   . Rectal cancer Neg Hx     SOCIAL HISTORY Social History   Tobacco Use  . Smoking status: Never Smoker  . Smokeless tobacco: Never Used  Substance Use Topics  . Alcohol use: No  . Drug use: No         OPHTHALMIC EXAM:  Base Eye Exam    Visual Acuity (ETDRS)      Right Left   Dist [place] CF at 3' 20/200   Dist ph [place] NI        Tonometry (Tonopen, 3:28 PM)      Right Left   Pressure 16 14          IMAGING AND PROCEDURES  Imaging and Procedures for _0 @  Color Fundus Photography Optos - OU - Both Eyes       Right Eye Progression has been stable. Disc findings include pallor.   Left Eye Progression has been stable. Disc findings include pallor.   Notes PDR OU QUIESCENT.  Good PRP, with lesion creep over the years post PRP OD, dislocated intraocular lens in the bag inferiorly                ASSESSMENT/PLAN:    ICD-10-CM   1. Dislocated intraocular lens, initial encounter  T85.22XA Color Fundus Photography Optos - OU - Both Eyes    Ophthalmic Meds Ordered this visit:  Meds ordered this encounter  Medications  . prednisoLONE acetate (PRED FORTE) 1 % ophthalmic suspension    Sig: Place 1 drop into the right eye 4 (four) times daily.    Dispense:  5 mL    Refill:  0  . tobramycin (TOBREX) 0.3 % ophthalmic solution  Sig: Place 1 drop into the right eye in the morning, at noon, in the evening, and at bedtime for 28 days.    Dispense:  5 mL    Refill:  0        Pre-op completed. Operative consent obtained with pre-op eye drops reviewed with [name] and sent via [facility] as needed. Post op instructions reviewed with patient and per patient all questions  answered.  Plan OD is vitrectomy, removal of dislocated intraocular lens, scleral tunnel fixation of posterior chamber intraocular lens.  Plan at [facility] surgical center, under local MAC monitor anesthesia control  Hurman Horn, MD

## 2019-10-18 ENCOUNTER — Other Ambulatory Visit: Payer: Self-pay | Admitting: *Deleted

## 2019-10-18 NOTE — Patient Outreach (Signed)
[facility]) Care Management  10/18/2019  LEANNA HAMID 03/06/1946 [id number]   RN Health Coach Quarterly Outreach  Referral Date:06/24/2017 Referral Source:MD Office Reason for Referral:"consult for medications, too expensive especially insulin" Insurance:Medicare   Outreach Attempt:  Outreach attempt #1 to patient for follow up. No answer and unable to leave voicemail message due to voicemail did not engage.  Plan:  RN Health Coach will make another outreach attempt within the month of June if no return call back from patient.   Bar [place]unn [phone] [email or web address]

## 2019-10-29 ENCOUNTER — Telehealth: Payer: Self-pay | Admitting: Family Medicine

## 2019-10-31 ENCOUNTER — Encounter (INDEPENDENT_AMBULATORY_CARE_PROVIDER_SITE_OTHER): Payer: Medicare Other | Admitting: Ophthalmology

## 2019-10-31 DIAGNOSIS — H59021 Cataract (lens) fragments in eye following cataract surgery, right eye: Secondary | ICD-10-CM | POA: Diagnosis not present

## 2019-10-31 DIAGNOSIS — H2701 Aphakia, right eye: Secondary | ICD-10-CM

## 2019-10-31 DIAGNOSIS — T8522XA Displacement of intraocular lens, initial encounter: Secondary | ICD-10-CM

## 2019-10-31 DIAGNOSIS — H21561 Pupillary abnormality, right eye: Secondary | ICD-10-CM | POA: Diagnosis not present

## 2019-11-01 ENCOUNTER — Encounter (INDEPENDENT_AMBULATORY_CARE_PROVIDER_SITE_OTHER): Payer: Self-pay | Admitting: Ophthalmology

## 2019-11-01 ENCOUNTER — Encounter (INDEPENDENT_AMBULATORY_CARE_PROVIDER_SITE_OTHER): Payer: Medicare Other | Admitting: Ophthalmology

## 2019-11-01 ENCOUNTER — Ambulatory Visit (INDEPENDENT_AMBULATORY_CARE_PROVIDER_SITE_OTHER): Payer: Medicare Other | Admitting: Ophthalmology

## 2019-11-01 ENCOUNTER — Other Ambulatory Visit: Payer: Self-pay

## 2019-11-01 DIAGNOSIS — Z09 Encounter for follow-up examination after completed treatment for conditions other than malignant neoplasm: Secondary | ICD-10-CM | POA: Insufficient documentation

## 2019-11-01 NOTE — Progress Notes (Signed)
11/01/2019     CHIEF COMPLAINT Patient presents for Post-op Follow-up   HISTORY OF PRESENT ILLNESS: [name] is a 74 y.o. female who presents to the clinic today for:   HPI    Post-op Follow-up    In right eye.  Discomfort includes pain.  Vision is blurred at distance.          Comments    1 day PO OD - Removal of dislocated IOL and Vitrectomy OD, and insertion of Zeiss CT [place] IOL via [place] tunnel Patient states she slept well last night but her eye is hurting.       Last edited by Hurman Horn, MD on 11/01/2019 10:27 AM. (History)      Referring physician: [name], DO [place],  [place] [address]  HISTORICAL INFORMATION:   Selected notes from the MEDICAL RECORD NUMBER    Lab Results  Component Value Date   HGBA1C 8.1 (H) 09/17/2019     CURRENT MEDICATIONS: Current Outpatient Medications (Ophthalmic Drugs)  Medication Sig  . prednisoLONE acetate (PRED FORTE) 1 % ophthalmic suspension Place 1 drop into the right eye 4 (four) times daily.  Marland Kitchen tobramycin (TOBREX) 0.3 % ophthalmic solution Place 1 drop into the right eye in the morning, at noon, in the evening, and at bedtime for 28 days.   No current facility-administered medications for this visit. (Ophthalmic Drugs)   Current Outpatient Medications (Other)  Medication Sig  . Accu-Chek Softclix Lancets lancets Use as instructed 2 x daily  E11.65  . amLODipine (NORVASC) 5 MG tablet Take 1 tablet (5 mg total) by mouth daily.  Marland Kitchen atorvastatin (LIPITOR) 10 MG tablet Take 1 tablet (10 mg total) by mouth daily.  . blood glucose meter kit and supplies Dispense based on patient and insurance preference. Use up to four times daily as directed. (FOR ICD-10 E10.9, E11.9).  . carvedilol (COREG) 6.25 MG tablet Take 1 tablet (6.25 mg total) by mouth 2 (two) times daily with a meal.  . Cholecalciferol 25 MCG (1000 UT) capsule Take 1 capsule (1,000 Units total) by mouth daily.  . colchicine  0.6 MG tablet Take 0.6 mg by mouth daily as needed.  . fluticasone (FLONASE) 50 MCG/ACT nasal spray Use 2 sprays in each nostril daily  . furosemide (LASIX) 40 MG tablet Take 1 tablet (40 mg total) by mouth 2 (two) times a week.  Marland Kitchen glipiZIDE (GLUCOTROL XL) 2.5 MG 24 hr tablet Take 1 tablet (2.5 mg total) by mouth daily with breakfast.  . glucose blood test strip Use as instructed up to 4x daily. E11.65  . Insulin Glargine (BASAGLAR KWIKPEN) 100 UNIT/ML SOPN Inject 0.1 mLs (10 Units total) into the skin daily.  . Lancets Misc. MISC Use as directed to check blood sugar up to 4x daily. E11.65  . levothyroxine (SYNTHROID) 75 MCG tablet Take 1 tablet (75 mcg total) by mouth daily before breakfast.  . pantoprazole (PROTONIX) 20 MG tablet Take 1 tablet (20 mg total) by mouth daily.  Marland Kitchen ULTICARE MICRO PEN NEEDLES 32G X 4 MM MISC TWICE DAILY   No current facility-administered medications for this visit. (Other)      REVIEW OF SYSTEMS:    ALLERGIES Allergies  Allergen Reactions  . Aspirin Nausea And Vomiting  . Ciprofloxacin Other (See Comments)    Upset Stomach  . Codeine Nausea And Vomiting    Makes me sick   . Losartan     Hyperkalemia   .  Micardis [Telmisartan] Other (See Comments)    unknown  . Nexium [Esomeprazole Magnesium] Other (See Comments)    Causes internal bleeding  . Niaspan [Niacin Er] Other (See Comments)    Reaction is unknown  . Onglyza [Saxagliptin] Other (See Comments)    Reaction is unknown  . Other     No otc pain medications  . Rofecoxib Other (See Comments)    unknown  . Simvastatin   . Tequin [Gatifloxacin] Other (See Comments)    Reaction is unknown  . Welchol [Colesevelam Hcl]   . Amoxicillin Nausea And Vomiting and Rash    Has patient had a PCN reaction causing immediate rash, facial/tongue/throat swelling, SOB or lightheadedness with hypotension: Yes Has patient had a PCN reaction causing severe rash involving mucus membranes or skin necrosis:  no  Has patient had a PCN reaction that required hospitalization No Has patient had a PCN reaction occurring within the last 10 years: No If all of the above answers are "NO", then may proceed with Cephalosporin use.     PAST MEDICAL HISTORY Past Medical History:  Diagnosis Date  . Anal fistula   . Anemia in chronic renal disease    Aranesp injection --  when Hg <11, last injection 12-24-14.  Marland Kitchen Anxiety   . Arthritis    knees and hand/fingers. "broke back"-being evaluated for this"weakness left leg"  . Cataract    both eyes done  . 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 CKD (chronic kidney disease), stage III    nephrologist--  dr Mercy Moore-- LOV  07-09-2016  . Complication of anesthesia    post-op confusion   . Constipation   . Coronary artery disease   . Diabetic gastroparesis ([place])   . Diabetic retinopathy ([place])    bilateral --  monitored by dr [name]  . Diverticulosis of colon   . GAD (generalized anxiety disorder)   . GERD (gastroesophageal reflux disease)   . History of colon polyps    benign  . History of esophagitis   . History of GI bleed    upper 2009  due to esophagitis  &  2002  due to Mallory-Weiss tear  . History of hyperkalemia    pt had previously been canceled twice dos due to elevated K+, 07-29-2016 last date cancelled -- pt visited pcp same day treated w/ kayexelate and K+ came down, pt brought all her medication's in to pcp office and found pt was taking losartan that had been discontinued due to ckd, pcp stated this was cause of elevated K+  . 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 History of rectal abscess    12-29-2004  bedside I & D  . Hyperlipidemia   . Hypertension   . Hypothyroidism   . LAFB (left anterior fascicular block)   . Right bundle branch block   . Sacral decubitus ulcer    since 2014- 01-02-15 remains with wound" gauze dressing changes daily.  . Type II diabetes mellitus ([place])     Past Surgical History:  Procedure Laterality Date  . APPLICATION OF A-CELL OF EXTREMITY N/A 04/07/2015   Procedure: A CELL PLACMENT;  Surgeon: Loel Lofty Dillingham, DO;  Location: [place];  Service: Plastics;  Laterality: N/A;  . CARDIOVASCULAR STRESS TEST  12-30-2004   normal perfusion study/  no ischemia or infartion/  normal LV wall function and wall motion , ef 66%  . CATARACT EXTRACTION W/ INTRAOCULAR LENS  IMPLANT, BILATERAL  1995  . COLONOSCOPY  2008   w/Dr.Brodie   . COLONOSCOPY W/ POLYPECTOMY  last one 2008  . COMPRESSION HIP SCREW Right 05/18/2014   Procedure: COMPRESSION HIP;  Surgeon: Carole Civil, MD;  Location: [facility];  Service: Orthopedics;  Laterality: Right;  . ESOPHAGOGASTRODUODENOSCOPY  last one 01-09-2011  . EVALUATION UNDER ANESTHESIA WITH FISTULECTOMY N/A 01/06/2015   Procedure: EXAM UNDER ANESTHESIA , placement of seton;  Surgeon: Jackolyn Confer, MD;  [facility];  Service: General;  Laterality: N/A;  . I & D EXTREMITY N/A 04/07/2015   Procedure: IRRIGATION AND DEBRIDEMENT ISCHIAL ULCER;  Surgeon: Loel Lofty Dillingham, DO;  Location: [place];  Service: Plastics;  Laterality: N/A;  . INCISION AND DRAINAGE OF WOUND N/A 09/30/2014   Procedure: IRRIGATION AND DEBRIDEMENT SACRAL WOUND, EXCISION OF PERIRECTAL TRACT WITH PLACEMENT OF ACCELL;  Surgeon: Theodoro Kos, DO;  Location: [place];  Service: Plastics;  Laterality: N/A;  . LIGATION OF INTERNAL FISTULA TRACT N/A 10/22/2016   Procedure: LIGATION OF INTERNAL FISTULA TRACT;  Surgeon: Leighton Ruff, MD;  Location: [facility];  Service: General;  Laterality: N/A;  . ORIF FEMUR FRACTURE Left 10/09/2012   Procedure: OPEN REDUCTION INTERNAL FIXATION (ORIF) DISTAL FEMUR FRACTURE;  Surgeon: Rozanna Box, MD;  Location: [place];  Service: Orthopedics;  Laterality: Left;  . RETINOPATHY SURGERY Bilateral 1980's?  . TRANSTHORACIC ECHOCARDIOGRAM  02-18-2011   mild LVH,  ef 55-60%,  grade I  diastolic dysfunction/  mild TR/  RV systolic pressure increased consistant with moderate pulmonary hypertension    FAMILY HISTORY Family History  Problem Relation Age of Onset  . Colon cancer Father 55  . Prostate cancer Father   . Diabetes Father   . Coronary artery disease Mother 52  . Heart disease Mother   . Diabetes Mother   . Coronary artery disease Sister 78  . Diabetes Sister   . Heart disease Sister   . Diabetes Sister   . Diabetes Sister   . Diabetes Sister   . Diabetes Sister   . Diabetes Sister   . Diabetes Maternal Grandmother   . Esophageal cancer Neg Hx   . Stomach cancer Neg Hx   . Rectal cancer Neg Hx     SOCIAL HISTORY Social History   Tobacco Use  . Smoking status: Never Smoker  . Smokeless tobacco: Never Used  Vaping Use  . Vaping Use: Never used  Substance Use Topics  . Alcohol use: No  . Drug use: No         OPHTHALMIC EXAM:  Base Eye Exam    Visual Acuity (Snellen - Linear)      Right Left   Dist [place] CF @ 3' 20/100-2   Dist ph [place] 20/200-1        Tonometry (Tonopen, 9:58 AM)      Right Left   Pressure 20 9       Neuro/Psych    Oriented x3: Yes   Mood/Affect: Normal       Dilation    Right eye: 1.0% Mydriacyl, 2.5% Phenylephrine @ 9:58 AM        Slit Lamp and Fundus Exam    External Exam      Right Left   External Normal Normal       Slit Lamp Exam      Right Left   Lids/Lashes Normal Normal   Conjunctiva/Sclera White and quiet White and quiet   Cornea Clear  Clear   Anterior Chamber Deep and quiet Deep and quiet   Iris Round and reactive Round and reactive   Lens Centered posterior chamber intraocular lens    Anterior Vitreous Normal Normal       Fundus Exam      Right Left   Posterior Vitreous Clear, vitrectomized    Disc Normal    C/D Ratio 0.5 0.5   Macula Normal    Vessels PDR quiescent    Periphery PRP 360.  attached           IMAGING AND PROCEDURES  Imaging and Procedures for 11/01/19            ASSESSMENT/PLAN:  Follow-up examination after eye surgery No lifting and bending for 1 week. No water in the eye for 10 days. Do not rub the eye. Wear shield at night for 1-3 days.  Wear your CPAP as normal, if instructed by your doctor.  Continue your topical medications for a total of 3 weeks.  Do not refill your postoperative medications unless instructed.      ICD-10-CM   1. Follow-up examination after eye surgery  Z09     1.  Excellent postoperative position for [place] scleral tunnel fixation of Zeiss CT Lucia lens OD  2.  I explained that visual clarity will hopefully continue to improve over the coming days but likely there will be a change in glasses prescription  3.  Ophthalmic Meds Ordered this visit:  No orders of the defined types were placed in this encounter.      Return in about 1 week (around 11/08/2019) for COLOR FP, OD, dilate.  There are no Patient Instructions on file for this visit.   Explained the diagnoses, plan, and follow up with the patient and they expressed understanding.  Patient expressed understanding of the importance of proper follow up care.   Clent Demark Daimon Kean M.D. Diseases & Surgery of the Retina and Vitreous Retina & Diabetic [place] 11/01/19     Abbreviations: M myopia (nearsighted); A astigmatism; H hyperopia (farsighted); P presbyopia; Mrx spectacle prescription;  CTL contact lenses; OD right eye; OS left eye; OU both eyes  XT exotropia; ET esotropia; PEK punctate epithelial keratitis; PEE punctate epithelial erosions; DES dry eye syndrome; MGD meibomian gland dysfunction; ATs artificial tears; PFAT's preservative free artificial tears; [place] nuclear sclerotic cataract; PSC posterior subcapsular cataract; ERM epi-retinal membrane; PVD posterior vitreous detachment; RD retinal detachment; DM diabetes mellitus; DR diabetic retinopathy; NPDR non-proliferative diabetic retinopathy; PDR proliferative diabetic retinopathy; CSME clinically  significant macular edema; DME diabetic macular edema; dbh dot blot hemorrhages; CWS cotton wool spot; POAG primary open angle glaucoma; C/D cup-to-disc ratio; HVF humphrey visual field; GVF goldmann visual field; OCT optical coherence tomography; IOP intraocular pressure; BRVO Branch retinal vein occlusion; CRVO central retinal vein occlusion; CRAO central retinal artery occlusion; BRAO branch retinal artery occlusion; RT retinal tear; SB scleral buckle; PPV pars plana vitrectomy; VH Vitreous hemorrhage; PRP panretinal laser photocoagulation; IVK intravitreal kenalog; VMT vitreomacular traction; MH Macular hole;  NVD neovascularization of the disc; NVE neovascularization elsewhere; AREDS age related eye disease study; ARMD age related macular degeneration; POAG primary open angle glaucoma; EBMD epithelial/anterior basement membrane dystrophy; ACIOL anterior chamber intraocular lens; IOL intraocular lens; PCIOL posterior chamber intraocular lens; Phaco/IOL phacoemulsification with intraocular lens placement; [place] photorefractive keratectomy; LASIK laser assisted in situ keratomileusis; HTN hypertension; DM diabetes mellitus; COPD chronic obstructive pulmonary disease

## 2019-11-01 NOTE — Assessment & Plan Note (Signed)
No lifting and bending for 1 week. No water in the eye for 10 days. Do not rub the eye. Wear shield at night for 1-3 days.  Wear your CPAP as normal, if instructed by your doctor.  Continue your topical medications for a total of 3 weeks.  Do not refill your postoperative medications unless instructed.   

## 2019-11-07 ENCOUNTER — Ambulatory Visit (INDEPENDENT_AMBULATORY_CARE_PROVIDER_SITE_OTHER): Payer: Medicare Other | Admitting: Ophthalmology

## 2019-11-07 ENCOUNTER — Other Ambulatory Visit: Payer: Self-pay

## 2019-11-07 ENCOUNTER — Encounter (INDEPENDENT_AMBULATORY_CARE_PROVIDER_SITE_OTHER): Payer: Self-pay | Admitting: Ophthalmology

## 2019-11-07 DIAGNOSIS — Z794 Long term (current) use of insulin: Secondary | ICD-10-CM

## 2019-11-07 DIAGNOSIS — E113553 Type 2 diabetes mellitus with stable proliferative diabetic retinopathy, bilateral: Secondary | ICD-10-CM | POA: Diagnosis not present

## 2019-11-07 DIAGNOSIS — T8522XA Displacement of intraocular lens, initial encounter: Secondary | ICD-10-CM

## 2019-11-07 NOTE — Progress Notes (Signed)
11/07/2019     CHIEF COMPLAINT Patient presents for Post-op Follow-up   HISTORY OF PRESENT ILLNESS: [name] is a 74 y.o. female who presents to the clinic today for:   HPI    Post-op Follow-up    In right eye.  Discomfort includes Negative for pain.  Vision is blurred at distance and is blurred at near.  I, the attending physician,  performed the HPI with the patient and updated documentation appropriately.          Comments    1 Week Post op OD. FP,, status post vitrectomy, removal of dislocated intraocular lens (silicone plate lens) and Yamani scleral tunnel insertion of a Zeiss CT [place],,   Pt states OD vision is blurry. Denies any pain. Using gtts as directed. BGL: 122       Last edited by Edmon Crape, MD on 11/07/2019  4:27 PM. (History)      Referring physician: Raliegh Ip, DO [address] [place],  [place] [address]  HISTORICAL INFORMATION:   Selected notes from the MEDICAL RECORD NUMBER    Lab Results  Component Value Date   HGBA1C 8.1 (H) 09/17/2019     CURRENT MEDICATIONS: Current Outpatient Medications (Ophthalmic Drugs)  Medication Sig  . prednisoLONE acetate (PRED FORTE) 1 % ophthalmic suspension Place 1 drop into the right eye 4 (four) times daily.  Marland Kitchen tobramycin (TOBREX) 0.3 % ophthalmic solution Place 1 drop into the right eye in the morning, at noon, in the evening, and at bedtime for 28 days.   No current facility-administered medications for this visit. (Ophthalmic Drugs)   Current Outpatient Medications (Other)  Medication Sig  . Accu-Chek Softclix Lancets lancets Use as instructed 2 x daily  E11.65  . amLODipine (NORVASC) 5 MG tablet Take 1 tablet (5 mg total) by mouth daily.  Marland Kitchen atorvastatin (LIPITOR) 10 MG tablet Take 1 tablet (10 mg total) by mouth daily.  . blood glucose meter kit and supplies Dispense based on patient and insurance preference. Use up to four times daily as directed. (FOR ICD-10 E10.9, E11.9).  .  carvedilol (COREG) 6.25 MG tablet Take 1 tablet (6.25 mg total) by mouth 2 (two) times daily with a meal.  . Cholecalciferol 25 MCG (1000 UT) capsule Take 1 capsule (1,000 Units total) by mouth daily.  . colchicine 0.6 MG tablet Take 0.6 mg by mouth daily as needed.  . fluticasone (FLONASE) 50 MCG/ACT nasal spray Use 2 sprays in each nostril daily  . furosemide (LASIX) 40 MG tablet Take 1 tablet (40 mg total) by mouth 2 (two) times a week.  Marland Kitchen glipiZIDE (GLUCOTROL XL) 2.5 MG 24 hr tablet Take 1 tablet (2.5 mg total) by mouth daily with breakfast.  . glucose blood test strip Use as instructed up to 4x daily. E11.65  . Insulin Glargine (BASAGLAR KWIKPEN) 100 UNIT/ML SOPN Inject 0.1 mLs (10 Units total) into the skin daily.  . Lancets Misc. MISC Use as directed to check blood sugar up to 4x daily. E11.65  . levothyroxine (SYNTHROID) 75 MCG tablet Take 1 tablet (75 mcg total) by mouth daily before breakfast.  . pantoprazole (PROTONIX) 20 MG tablet Take 1 tablet (20 mg total) by mouth daily.  Marland Kitchen ULTICARE MICRO PEN NEEDLES 32G X 4 MM MISC TWICE DAILY   No current facility-administered medications for this visit. (Other)      REVIEW OF SYSTEMS:    ALLERGIES Allergies  Allergen Reactions  . Aspirin Nausea And Vomiting  .  Ciprofloxacin Other (See Comments)    Upset Stomach  . Codeine Nausea And Vomiting    Makes me sick   . Losartan     Hyperkalemia   . Micardis [Telmisartan] Other (See Comments)    unknown  . Nexium [Esomeprazole Magnesium] Other (See Comments)    Causes internal bleeding  . Niaspan [Niacin Er] Other (See Comments)    Reaction is unknown  . Onglyza [Saxagliptin] Other (See Comments)    Reaction is unknown  . Other     No otc pain medications  . Rofecoxib Other (See Comments)    unknown  . Simvastatin   . Tequin [Gatifloxacin] Other (See Comments)    Reaction is unknown  . Welchol [Colesevelam Hcl]   . Amoxicillin Nausea And Vomiting and Rash    Has patient had  a PCN reaction causing immediate rash, facial/tongue/throat swelling, SOB or lightheadedness with hypotension: Yes Has patient had a PCN reaction causing severe rash involving mucus membranes or skin necrosis: no  Has patient had a PCN reaction that required hospitalization No Has patient had a PCN reaction occurring within the last 10 years: No If all of the above answers are "NO", then may proceed with Cephalosporin use.     PAST MEDICAL HISTORY Past Medical History:  Diagnosis Date  . Anal fistula   . Anemia in chronic renal disease    Aranesp injection --  when Hg <11, last injection 12-24-14.  Marland Kitchen Anxiety   . Arthritis    knees and hand/fingers. "broke back"-being evaluated for this"weakness left leg"  . Cataract    both eyes done  . 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 CKD (chronic kidney disease), stage III    nephrologist--  dr Mercy Moore-- LOV  07-09-2016  . Complication of anesthesia    post-op confusion   . Constipation   . Coronary artery disease   . Diabetic gastroparesis ([place])   . Diabetic retinopathy ([place])    bilateral --  monitored by dr [name]  . Diverticulosis of colon   . GAD (generalized anxiety disorder)   . GERD (gastroesophageal reflux disease)   . History of colon polyps    benign  . History of esophagitis   . History of GI bleed    upper 2009  due to esophagitis  &  2002  due to Mallory-Weiss tear  . History of hyperkalemia    pt had previously been canceled twice dos due to elevated K+, 07-29-2016 last date cancelled -- pt visited pcp same day treated w/ kayexelate and K+ came down, pt brought all her medication's in to pcp office and found pt was taking losartan that had been discontinued due to ckd, pcp stated this was cause of elevated K+  . 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 History of rectal abscess    12-29-2004  bedside I & D  . Hyperlipidemia   . Hypertension   . Hypothyroidism   .  LAFB (left anterior fascicular block)   . Right bundle branch block   . Sacral decubitus ulcer    since 2014- 01-02-15 remains with wound" gauze dressing changes daily.  . Type II diabetes mellitus ([place])    Past Surgical History:  Procedure Laterality Date  . APPLICATION OF A-CELL OF EXTREMITY N/A 04/07/2015   Procedure: A CELL PLACMENT;  Surgeon: Loel Lofty Dillingham, DO;  Location: [facility];  Service: Plastics;  Laterality: N/A;  .  CARDIOVASCULAR STRESS TEST  12-30-2004   normal perfusion study/  no ischemia or infartion/  normal LV wall function and wall motion , ef 66%  . CATARACT EXTRACTION W/ INTRAOCULAR LENS  IMPLANT, BILATERAL  1995  . COLONOSCOPY  2008   w/Dr.Brodie   . COLONOSCOPY W/ POLYPECTOMY  last one 2008  . COMPRESSION HIP SCREW Right 05/18/2014   Procedure: COMPRESSION HIP;  Surgeon: Carole Civil, MD;  Location: [facility];  Service: Orthopedics;  Laterality: Right;  . ESOPHAGOGASTRODUODENOSCOPY  last one 01-09-2011  . EVALUATION UNDER ANESTHESIA WITH FISTULECTOMY N/A 01/06/2015   Procedure: EXAM UNDER ANESTHESIA , placement of seton;  Surgeon: Jackolyn Confer, MD;  Location: [facility];  Service: General;  Laterality: N/A;  . I & D EXTREMITY N/A 04/07/2015   Procedure: IRRIGATION AND DEBRIDEMENT ISCHIAL ULCER;  Surgeon: Loel Lofty Dillingham, DO;  Location: [facility];  Service: Plastics;  Laterality: N/A;  . INCISION AND DRAINAGE OF WOUND N/A 09/30/2014   Procedure: IRRIGATION AND DEBRIDEMENT SACRAL WOUND, EXCISION OF PERIRECTAL TRACT WITH PLACEMENT OF ACCELL;  Surgeon: Theodoro Kos, DO;  Location: [facility];  Service: Plastics;  Laterality: N/A;  . LIGATION OF INTERNAL FISTULA TRACT N/A 10/22/2016   Procedure: LIGATION OF INTERNAL FISTULA TRACT;  Surgeon: Leighton Ruff, MD;  Location: [facility];  Service: General;  Laterality: N/A;  . ORIF FEMUR FRACTURE Left 10/09/2012   Procedure: OPEN REDUCTION INTERNAL FIXATION (ORIF) DISTAL FEMUR FRACTURE;   Surgeon: Rozanna Box, MD;  Location: [facility];  Service: Orthopedics;  Laterality: Left;  . RETINOPATHY SURGERY Bilateral 1980's?  . TRANSTHORACIC ECHOCARDIOGRAM  02-18-2011   mild LVH,  ef 55-60%,  grade I diastolic dysfunction/  mild TR/  RV systolic pressure increased consistant with moderate pulmonary hypertension    FAMILY HISTORY Family History  Problem Relation Age of Onset  . Colon cancer Father 49  . Prostate cancer Father   . Diabetes Father   . Coronary artery disease Mother 27  . Heart disease Mother   . Diabetes Mother   . Coronary artery disease Sister 28  . Diabetes Sister   . Heart disease Sister   . Diabetes Sister   . Diabetes Sister   . Diabetes Sister   . Diabetes Sister   . Diabetes Sister   . Diabetes Maternal Grandmother   . Esophageal cancer Neg Hx   . Stomach cancer Neg Hx   . Rectal cancer Neg Hx     SOCIAL HISTORY Social History   Tobacco Use  . Smoking status: Never Smoker  . Smokeless tobacco: Never Used  Vaping Use  . Vaping Use: Never used  Substance Use Topics  . Alcohol use: No  . Drug use: No         OPHTHALMIC EXAM:  Base Eye Exam    Visual Acuity (Snellen - Linear)      Right Left   Dist [place] CF @ 5' 20/100 -1   Dist ph [place] 20/100 +        Tonometry (Tonopen, 3:33 PM)      Right Left   Pressure 13 14       Pupils      Dark Light Shape React APD   Right 3 3 Round Minimal None   Left 2 2 Round Minimal None       Neuro/Psych    Oriented x3: Yes   Mood/Affect: Normal       Dilation    Right eye: 1.0%  Mydriacyl, 2.5% Phenylephrine @ 3:33 PM        Slit Lamp and Fundus Exam    External Exam      Right Left   External Normal Normal       Slit Lamp Exam      Right Left   Lids/Lashes Normal Normal   Conjunctiva/Sclera White and quiet, no exposed haptic bulbs. White and quiet, no exposed haptic bulbs.   Cornea Clear Clear   Anterior Chamber Deep and quiet Deep and quiet   Iris Round and reactive, PI  inferonasal patent Round and reactive   Lens Centered posterior chamber intraocular lens Centered posterior chamber intraocular lens   Anterior Vitreous Normal Normal       Fundus Exam      Right Left   Posterior Vitreous Clear, vitrectomized    Disc Normal    C/D Ratio 0.5    Macula Normal    Vessels PDR quiescent    Periphery PRP 360.  attached           IMAGING AND PROCEDURES  Imaging and Procedures for 11/07/19  Color Fundus Photography Optos - OU - Both Eyes       Right Eye Progression has been stable. Disc findings include pallor.   Left Eye Progression has been stable. Disc findings include pallor. Macula : microaneurysms. Vessels : attenuated.   Notes Good PRP OU.  Stable.  Clear media.                ASSESSMENT/PLAN:  No problem-specific Assessment & Plan notes found for this encounter.      ICD-10-CM   1. Controlled type 2 diabetes mellitus with stable proliferative retinopathy of both eyes, with long-term current use of insulin (HCC)  O70.7867 Color Fundus Photography Optos - OU - Both Eyes   Z79.4   2. Dislocated intraocular lens, initial encounter  T85.22XA     1.  She will complete the topical medications on the right IV antibiotic and also the steroid.  She should not refill the current bottles.  Follow-up visit in 3 weeks with an OCT OU.  2.  Patient was informed she will likely need refraction.  Her pinhole visual acuity is 20/100 so far is as expected  3.  Ophthalmic Meds Ordered this visit:  No orders of the defined types were placed in this encounter.      Return in about 3 weeks (around 11/28/2019) for POST OP, OD, OCT.  There are no Patient Instructions on file for this visit.   Explained the diagnoses, plan, and follow up with the patient and they expressed understanding.  Patient expressed understanding of the importance of proper follow up care.   Alford Highland Frenchie Pribyl M.D. Diseases & Surgery of the Retina and Vitreous [facility] 11/07/19     Abbreviations: M myopia (nearsighted); A astigmatism; H hyperopia (farsighted); P presbyopia; Mrx spectacle prescription;  CTL contact lenses; OD right eye; OS left eye; OU both eyes  XT exotropia; ET esotropia; PEK punctate epithelial keratitis; PEE punctate epithelial erosions; DES dry eye syndrome; MGD meibomian gland dysfunction; ATs artificial tears; PFAT's preservative free artificial tears; NSC nuclear sclerotic cataract; PSC posterior subcapsular cataract; ERM epi-retinal membrane; PVD posterior vitreous detachment; RD retinal detachment; DM diabetes mellitus; DR diabetic retinopathy; NPDR non-proliferative diabetic retinopathy; PDR proliferative diabetic retinopathy; CSME clinically significant macular edema; DME diabetic macular edema; dbh dot blot hemorrhages; CWS cotton wool spot; POAG primary open angle glaucoma; C/D cup-to-disc ratio;  HVF humphrey visual field; GVF goldmann visual field; OCT optical coherence tomography; IOP intraocular pressure; BRVO Branch retinal vein occlusion; CRVO central retinal vein occlusion; CRAO central retinal artery occlusion; BRAO branch retinal artery occlusion; RT retinal tear; SB scleral buckle; PPV pars plana vitrectomy; VH Vitreous hemorrhage; PRP panretinal laser photocoagulation; IVK intravitreal kenalog; VMT vitreomacular traction; MH Macular hole;  NVD neovascularization of the disc; NVE neovascularization elsewhere; AREDS age related eye disease study; ARMD age related macular degeneration; POAG primary open angle glaucoma; EBMD epithelial/anterior basement membrane dystrophy; ACIOL anterior chamber intraocular lens; IOL intraocular lens; PCIOL posterior chamber intraocular lens; Phaco/IOL phacoemulsification with intraocular lens placement; [place] photorefractive keratectomy; LASIK laser assisted in situ keratomileusis; HTN hypertension; DM diabetes mellitus; COPD chronic obstructive pulmonary disease

## 2019-11-08 ENCOUNTER — Encounter (INDEPENDENT_AMBULATORY_CARE_PROVIDER_SITE_OTHER): Payer: Medicare Other | Admitting: Ophthalmology

## 2019-11-16 ENCOUNTER — Other Ambulatory Visit: Payer: Self-pay | Admitting: *Deleted

## 2019-11-16 NOTE — Patient Outreach (Signed)
[facility]) Care Management  11/16/2019  [name] 04/23/1946 [id number]   [place] Quarterly Outreach  Referral Date:06/24/2017 Referral Source:MD Office Reason for Referral:"consult for medications, too expensive especially insulin" Insurance:Medicare   Outreach Attempt:  Outreach attempt #2 to patient for follow up. No answer and unable to leave voicemail message due to voicemail did not engage.  Plan:  RN Health Coach will make another outreach attempt within the month of July if no return call back from patient.   [place] [phone] [email or web address]

## 2019-11-29 ENCOUNTER — Ambulatory Visit (INDEPENDENT_AMBULATORY_CARE_PROVIDER_SITE_OTHER): Payer: Medicare Other | Admitting: Ophthalmology

## 2019-11-29 ENCOUNTER — Other Ambulatory Visit: Payer: Self-pay

## 2019-11-29 ENCOUNTER — Encounter (INDEPENDENT_AMBULATORY_CARE_PROVIDER_SITE_OTHER): Payer: Self-pay | Admitting: Ophthalmology

## 2019-11-29 DIAGNOSIS — E113551 Type 2 diabetes mellitus with stable proliferative diabetic retinopathy, right eye: Secondary | ICD-10-CM | POA: Diagnosis not present

## 2019-11-29 DIAGNOSIS — H2701 Aphakia, right eye: Secondary | ICD-10-CM | POA: Diagnosis not present

## 2019-11-29 DIAGNOSIS — T8522XA Displacement of intraocular lens, initial encounter: Secondary | ICD-10-CM

## 2019-11-29 NOTE — Assessment & Plan Note (Signed)
OD, visual acuity continues to improve per patient after vitrectomy and removal of dislocated intraocular lens of a silicone plate lens, and insertion via Yamani scleral tunnel Zeiss CT [place]  lens

## 2019-11-29 NOTE — Progress Notes (Signed)
11/29/2019     CHIEF COMPLAINT Patient presents for Post-op Follow-up   HISTORY OF PRESENT ILLNESS: [name] is a 74 y.o. female who presents to the clinic today for:   HPI    Post-op Follow-up    In right eye.  Discomfort includes none.  Vision is blurred at distance and is blurred at near.  I, the attending physician,  performed the HPI with the patient and updated documentation appropriately.          Comments    3 Week s\p Vitrectomy, Removal of dislocated lens and Yamani scleral tunnel insertion of a Zeiss CT Mitchell, OD  Pt states vision is still blurry.  BGL: 112       Last edited by Tilda Franco on 11/29/2019  3:19 PM. (History)      Referring physician: [name], DO [place],  [place] [address]  HISTORICAL INFORMATION:   Selected notes from the MEDICAL RECORD NUMBER    Lab Results  Component Value Date   HGBA1C 8.1 (H) 09/17/2019     CURRENT MEDICATIONS: Current Outpatient Medications (Ophthalmic Drugs)  Medication Sig  . prednisoLONE acetate (PRED FORTE) 1 % ophthalmic suspension Place 1 drop into the right eye 4 (four) times daily.   No current facility-administered medications for this visit. (Ophthalmic Drugs)   Current Outpatient Medications (Other)  Medication Sig  . Accu-Chek Softclix Lancets lancets Use as instructed 2 x daily  E11.65  . amLODipine (NORVASC) 5 MG tablet Take 1 tablet (5 mg total) by mouth daily.  Marland Kitchen atorvastatin (LIPITOR) 10 MG tablet Take 1 tablet (10 mg total) by mouth daily.  . blood glucose meter kit and supplies Dispense based on patient and insurance preference. Use up to four times daily as directed. (FOR ICD-10 E10.9, E11.9).  . carvedilol (COREG) 6.25 MG tablet Take 1 tablet (6.25 mg total) by mouth 2 (two) times daily with a meal.  . Cholecalciferol 25 MCG (1000 UT) capsule Take 1 capsule (1,000 Units total) by mouth daily.  . colchicine 0.6 MG tablet Take 0.6 mg by mouth daily as needed.    . fluticasone (FLONASE) 50 MCG/ACT nasal spray Use 2 sprays in each nostril daily  . furosemide (LASIX) 40 MG tablet Take 1 tablet (40 mg total) by mouth 2 (two) times a week.  Marland Kitchen glipiZIDE (GLUCOTROL XL) 2.5 MG 24 hr tablet Take 1 tablet (2.5 mg total) by mouth daily with breakfast.  . glucose blood test strip Use as instructed up to 4x daily. E11.65  . Insulin Glargine (BASAGLAR KWIKPEN) 100 UNIT/ML SOPN Inject 0.1 mLs (10 Units total) into the skin daily.  . Lancets Misc. MISC Use as directed to check blood sugar up to 4x daily. E11.65  . levothyroxine (SYNTHROID) 75 MCG tablet Take 1 tablet (75 mcg total) by mouth daily before breakfast.  . pantoprazole (PROTONIX) 20 MG tablet Take 1 tablet (20 mg total) by mouth daily.  Marland Kitchen ULTICARE MICRO PEN NEEDLES 32G X 4 MM MISC TWICE DAILY   No current facility-administered medications for this visit. (Other)      REVIEW OF SYSTEMS:    ALLERGIES Allergies  Allergen Reactions  . Aspirin Nausea And Vomiting  . Ciprofloxacin Other (See Comments)    Upset Stomach  . Codeine Nausea And Vomiting    Makes me sick   . Losartan     Hyperkalemia   . Micardis [Telmisartan] Other (See Comments)    unknown  .  Nexium [Esomeprazole Magnesium] Other (See Comments)    Causes internal bleeding  . Niaspan [Niacin Er] Other (See Comments)    Reaction is unknown  . Onglyza [Saxagliptin] Other (See Comments)    Reaction is unknown  . Other     No otc pain medications  . Rofecoxib Other (See Comments)    unknown  . Simvastatin   . Tequin [Gatifloxacin] Other (See Comments)    Reaction is unknown  . Welchol [Colesevelam Hcl]   . Amoxicillin Nausea And Vomiting and Rash    Has patient had a PCN reaction causing immediate rash, facial/tongue/throat swelling, SOB or lightheadedness with hypotension: Yes Has patient had a PCN reaction causing severe rash involving mucus membranes or skin necrosis: no  Has patient had a PCN reaction that required  hospitalization No Has patient had a PCN reaction occurring within the last 10 years: No If all of the above answers are "NO", then may proceed with Cephalosporin use.     PAST MEDICAL HISTORY Past Medical History:  Diagnosis Date  . Anal fistula   . Anemia in chronic renal disease    Aranesp injection --  when Hg <11, last injection 12-24-14.  Marland Kitchen Anxiety   . Arthritis    knees and hand/fingers. "broke back"-being evaluated for this"weakness left leg"  . Cataract    both eyes done  . 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 CKD (chronic kidney disease), stage III    nephrologist--  dr Mercy Moore-- LOV  07-09-2016  . Complication of anesthesia    post-op confusion   . Constipation   . Coronary artery disease   . Diabetic gastroparesis ([place])   . Diabetic retinopathy ([place])    bilateral --  monitored by dr [name]  . Diverticulosis of colon   . GAD (generalized anxiety disorder)   . GERD (gastroesophageal reflux disease)   . History of colon polyps    benign  . History of esophagitis   . History of GI bleed    upper 2009  due to esophagitis  &  2002  due to Mallory-Weiss tear  . History of hyperkalemia    pt had previously been canceled twice dos due to elevated K+, 07-29-2016 last date cancelled -- pt visited pcp same day treated w/ kayexelate and K+ came down, pt brought all her medication's in to pcp office and found pt was taking losartan that had been discontinued due to ckd, pcp stated this was cause of elevated K+  . 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 History of rectal abscess    12-29-2004  bedside I & D  . Hyperlipidemia   . Hypertension   . Hypothyroidism   . LAFB (left anterior fascicular block)   . Right bundle branch block   . Sacral decubitus ulcer    since 2014- 01-02-15 remains with wound" gauze dressing changes daily.  . Type II diabetes mellitus ([place])    Past Surgical History:  Procedure Laterality Date    . APPLICATION OF A-CELL OF EXTREMITY N/A 04/07/2015   Procedure: A CELL PLACMENT;  Surgeon: Loel Lofty Dillingham, DO;  Location: [facility];  Service: Plastics;  Laterality: N/A;  . CARDIOVASCULAR STRESS TEST  12-30-2004   normal perfusion study/  no ischemia or infartion/  normal LV wall function and wall motion , ef 66%  . CATARACT EXTRACTION W/ INTRAOCULAR LENS  IMPLANT, BILATERAL  1995  . COLONOSCOPY  2008  w/Dr.Brodie   . COLONOSCOPY W/ POLYPECTOMY  last one 2008  . COMPRESSION HIP SCREW Right 05/18/2014   Procedure: COMPRESSION HIP;  Surgeon: Carole Civil, MD;  Location: [facility];  Service: Orthopedics;  Laterality: Right;  . ESOPHAGOGASTRODUODENOSCOPY  last one 01-09-2011  . EVALUATION UNDER ANESTHESIA WITH FISTULECTOMY N/A 01/06/2015   Procedure: EXAM UNDER ANESTHESIA , placement of seton;  Surgeon: Jackolyn Confer, MD;  Location: [facility];  Service: General;  Laterality: N/A;  . I & D EXTREMITY N/A 04/07/2015   Procedure: IRRIGATION AND DEBRIDEMENT ISCHIAL ULCER;  Surgeon: Loel Lofty Dillingham, DO;  Location: [facility];  Service: Plastics;  Laterality: N/A;  . INCISION AND DRAINAGE OF WOUND N/A 09/30/2014   Procedure: IRRIGATION AND DEBRIDEMENT SACRAL WOUND, EXCISION OF PERIRECTAL TRACT WITH PLACEMENT OF ACCELL;  Surgeon: Theodoro Kos, DO;  Location: [facility];  Service: Plastics;  Laterality: N/A;  . LIGATION OF INTERNAL FISTULA TRACT N/A 10/22/2016   Procedure: LIGATION OF INTERNAL FISTULA TRACT;  Surgeon: Leighton Ruff, MD;  Location[facility];  Service: General;  Laterality: N/A;  . ORIF FEMUR FRACTURE Left 10/09/2012   Procedure: OPEN REDUCTION INTERNAL FIXATION (ORIF) DISTAL FEMUR FRACTURE;  Surgeon: Rozanna Box, MD;  Location: [facility];  Service: Orthopedics;  Laterality: Left;  . RETINOPATHY SURGERY Bilateral 1980's?  . TRANSTHORACIC ECHOCARDIOGRAM  02-18-2011   mild LVH,  ef 55-60%,  grade I diastolic dysfunction/  mild TR/  RV systolic pressure  increased consistant with moderate pulmonary hypertension    FAMILY HISTORY Family History  Problem Relation Age of Onset  . Colon cancer Father 52  . Prostate cancer Father   . Diabetes Father   . Coronary artery disease Mother 65  . Heart disease Mother   . Diabetes Mother   . Coronary artery disease Sister 77  . Diabetes Sister   . Heart disease Sister   . Diabetes Sister   . Diabetes Sister   . Diabetes Sister   . Diabetes Sister   . Diabetes Sister   . Diabetes Maternal Grandmother   . Esophageal cancer Neg Hx   . Stomach cancer Neg Hx   . Rectal cancer Neg Hx     SOCIAL HISTORY Social History   Tobacco Use  . Smoking status: Never Smoker  . Smokeless tobacco: Never Used  Vaping Use  . Vaping Use: Never used  Substance Use Topics  . Alcohol use: No  . Drug use: No         OPHTHALMIC EXAM:  Base Eye Exam    Visual Acuity (Snellen - Linear)      Right Left   Dist [place] E card @ 5' 20/100 -1   Dist ph [place] 20/200 NI       Tonometry (Tonopen, 3:24 PM)      Right Left   Pressure 16 14       Pupils      Pupils Dark Light Shape React APD   Right PERRL 3 3 Round Minimal None   Left PERRL 2 2 Round Minimal None       Visual Fields (Counting fingers)      Left Right   Restrictions Partial outer superior temporal, inferior temporal, superior nasal, inferior nasal deficiencies Partial outer superior temporal, inferior temporal, superior nasal, inferior nasal deficiencies       Neuro/Psych    Oriented x3: Yes   Mood/Affect: Normal       Dilation    Right eye: 1.0%  Mydriacyl, 2.5% Phenylephrine @ 3:24 PM        Slit Lamp and Fundus Exam    External Exam      Right Left   External Normal Normal       Slit Lamp Exam      Right Left   Lids/Lashes Normal Normal   Conjunctiva/Sclera White and quiet, no exposed haptic bulbs. White and quiet, no exposed haptic bulbs.   Cornea Clear Clear   Anterior Chamber Deep and quiet Deep and quiet   Iris Round  and reactive, PI inferonasal patent Round and reactive   Lens Centered posterior chamber intraocular lens Centered posterior chamber intraocular lens   Anterior Vitreous Normal Normal       Fundus Exam      Right Left   Posterior Vitreous Clear, vitrectomized    Disc Normal    C/D Ratio 0.5    Macula Normal    Vessels PDR quiescent    Periphery PRP 360.  attached           IMAGING AND PROCEDURES  Imaging and Procedures for 11/29/19  OCT, Retina - OU - Both Eyes       Right Eye Quality was good. Scan locations included subfoveal. Central Foveal Thickness: 211. Findings include no SRF, outer retinal atrophy, central retinal atrophy, inner retinal atrophy.   Left Eye Quality was good. Scan locations included subfoveal. Central Foveal Thickness: 183.   Notes OD no active macular edema, ongoing foveal atrophy although no appreciable change over the last 2 months.                ASSESSMENT/PLAN:  Aphakia of eye, right OD, visual acuity continues to improve per patient after vitrectomy and removal of dislocated intraocular lens of a silicone plate lens, and insertion via Yamani scleral tunnel Zeiss CT [place]  lens      ICD-10-CM   1. Dislocated intraocular lens, initial encounter  T85.22XA OCT, Retina - OU - Both Eyes  2. Aphakia of eye, right  H27.01     1.  2.  3.  Ophthalmic Meds Ordered this visit:  No orders of the defined types were placed in this encounter.      Return in about 3 weeks (around 12/20/2019) for dilate, OS, OCT.  There are no Patient Instructions on file for this visit.   Explained the diagnoses, plan, and follow up with the patient and they expressed understanding.  Patient expressed understanding of the importance of proper follow up care.   Clent Demark Rhen Dossantos M.D. Diseases & Surgery of the Retina and Vitreous Retina & Diabetic [place] 11/29/19     Abbreviations: M myopia (nearsighted); A astigmatism; H hyperopia  (farsighted); P presbyopia; Mrx spectacle prescription;  CTL contact lenses; OD right eye; OS left eye; OU both eyes  XT exotropia; ET esotropia; PEK punctate epithelial keratitis; PEE punctate epithelial erosions; DES dry eye syndrome; MGD meibomian gland dysfunction; ATs artificial tears; PFAT's preservative free artificial tears; [place] nuclear sclerotic cataract; PSC posterior subcapsular cataract; ERM epi-retinal membrane; PVD posterior vitreous detachment; RD retinal detachment; DM diabetes mellitus; DR diabetic retinopathy; NPDR non-proliferative diabetic retinopathy; PDR proliferative diabetic retinopathy; CSME clinically significant macular edema; DME diabetic macular edema; dbh dot blot hemorrhages; CWS cotton wool spot; POAG primary open angle glaucoma; C/D cup-to-disc ratio; HVF humphrey visual field; GVF goldmann visual field; OCT optical coherence tomography; IOP intraocular pressure; BRVO Branch retinal vein occlusion; CRVO central retinal vein occlusion; CRAO central retinal artery occlusion; BRAO  branch retinal artery occlusion; RT retinal tear; SB scleral buckle; PPV pars plana vitrectomy; VH Vitreous hemorrhage; PRP panretinal laser photocoagulation; IVK intravitreal kenalog; VMT vitreomacular traction; MH Macular hole;  NVD neovascularization of the disc; NVE neovascularization elsewhere; AREDS age related eye disease study; ARMD age related macular degeneration; POAG primary open angle glaucoma; EBMD epithelial/anterior basement membrane dystrophy; ACIOL anterior chamber intraocular lens; IOL intraocular lens; PCIOL posterior chamber intraocular lens; Phaco/IOL phacoemulsification with intraocular lens placement; [place] photorefractive keratectomy; LASIK laser assisted in situ keratomileusis; HTN hypertension; DM diabetes mellitus; COPD chronic obstructive pulmonary disease

## 2019-12-11 ENCOUNTER — Other Ambulatory Visit: Payer: Self-pay | Admitting: *Deleted

## 2019-12-11 ENCOUNTER — Telehealth: Payer: Self-pay | Admitting: Family Medicine

## 2019-12-11 DIAGNOSIS — E1151 Type 2 diabetes mellitus with diabetic peripheral angiopathy without gangrene: Secondary | ICD-10-CM | POA: Diagnosis not present

## 2019-12-11 DIAGNOSIS — L84 Corns and callosities: Secondary | ICD-10-CM | POA: Diagnosis not present

## 2019-12-11 DIAGNOSIS — M79676 Pain in unspecified toe(s): Secondary | ICD-10-CM | POA: Diagnosis not present

## 2019-12-11 DIAGNOSIS — B351 Tinea unguium: Secondary | ICD-10-CM | POA: Diagnosis not present

## 2019-12-11 NOTE — Telephone Encounter (Signed)
Medicare requirements for libre: Check BGs 4 times daily At least 3 injections I believe. Will cc to [facility] but I don't think her ins will cover.

## 2019-12-11 NOTE — Telephone Encounter (Signed)
Pt requesting to speak with Dr. Darnell Level. She states that she is starting she would like a glucometer ordered. She states she also had more eye surgery and her sight is not great.

## 2019-12-11 NOTE — Telephone Encounter (Signed)
Yes, you are correct Dr. Cherylann Parr Insurance will not cover unless:  -patient is injecting insulin 3 times daily AND -patient is testing blood sugar 4-6 times daily  These are medicare guidelines.  Patient can call insurance to see if they would make an exception due to vision loss, but I have yet to see that work out.  It would likely go the appeals II level.   Libre 2 out of pocket at [employer] is $75/month if patient can afford.

## 2019-12-11 NOTE — Telephone Encounter (Signed)
Called pt:  She is losing eye sight more and more= checking BS and seeing the numbers are getting more difficult.  She thinks that a [place] or something may be better.  Generally only checks BS BID, insulin is once a day     [facility] [place]

## 2019-12-11 NOTE — Telephone Encounter (Signed)
Pt can not see and her her husband is in bad health - are either of you aware of any other devices that are easy to operate and read?

## 2019-12-11 NOTE — Patient Outreach (Signed)
[facility]) Care Management  12/11/2019  KALKIDAN CAUDELL [birth year] [id number]   [place]  Referral Date:06/24/2017 Referral Source:MD Office Reason for Referral:"consult for medications, too expensive especially insulin" Insurance:Medicare   Outreach Attempt:  Outreach attempt #3 to patient for follow up.  Patient answered and verified HIPAA.  Reports her husband has recently been diagnosed with Leukemia and is currently hospitalized.  She also reports recent eye surgery 4 weeks ago and her vision is getting worse.  Patient asking about new CBG meter and is wanting to use the [employer].  Has contacted primary care provider for prescription for new meter.  Encouraged patient to check with provider and pharmacy to see if new prescription has been sent in and if there is an issue to speak with Endocrinologist concerning new meter.  Patient also stating with her husband's new diagnosis and her worsening eye sight she is having trouble with transportation to medical appointments. 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  RN Health Coach contacted [employer] [phone]) and verified patient did not have transportation benefits with [employer] as secondary insurance.  Contacted [facility] [phone]) and was given the number to [facility] ([phone]).  Patient unable to write down number due to vision and no one in the home available to assist.  She is awaiting call from hospital concerning her husband and is requesting a call back later in the week to assist with contacting [facility] for transportation resources and to complete telephone assessment.  Plan:  RN Health Coach will make another outreach attempt within the month of July per patient's request.   Hubert Azure RN [facility] [phone] [email or web address]

## 2019-12-13 ENCOUNTER — Encounter: Payer: Self-pay | Admitting: *Deleted

## 2019-12-13 ENCOUNTER — Other Ambulatory Visit: Payer: Self-pay | Admitting: *Deleted

## 2019-12-13 NOTE — Patient Outreach (Signed)
[facility]) Care Management  [place]  12/13/2019   [name] 10-Mar-1946 [id number]   [place] Outreach   Referral Date:  06/24/2017 Referral Source:  MD Office Reason for Referral:  "consult for medications, too expensive especially insulin" Insurance:  Medicare    Outreach Attempt:  Successful telephone outreach to patient for follow up to assist with transportation application.  Patient answered and verified HIPAA.  States her husband is now home but not feeling well at the time.  RN Health Coach offered to assist with calling [facility] to start transportation application for patient.  Patient declining at this time, stating she needs to care for her husband and request brochure be mailed instead.  States she will get someone to assist her to call [facility] as she will not be able to see number.  Reports fasting blood sugar this morning was 147.  Latest Hgb A1C increased to 8.1.  Reports recent eye surgery and with continued vision problems.  Still awaiting information concerning new CBG meter from providers office per patient.  Encounter Medications:  Outpatient Encounter Medications as of 12/13/2019  Medication Sig Note  . Accu-Chek Softclix Lancets lancets Use as instructed 2 x daily  E11.65   . amLODipine (NORVASC) 5 MG tablet Take 1 tablet (5 mg total) by mouth daily.   Marland Kitchen atorvastatin (LIPITOR) 10 MG tablet Take 1 tablet (10 mg total) by mouth daily.   . blood glucose meter kit and supplies Dispense based on patient and insurance preference. Use up to four times daily as directed. (FOR ICD-10 E10.9, E11.9).   . carvedilol (COREG) 6.25 MG tablet Take 1 tablet (6.25 mg total) by mouth 2 (two) times daily with a meal.   . Cholecalciferol 25 MCG (1000 UT) capsule Take 1 capsule (1,000 Units total) by mouth daily.   . colchicine 0.6 MG tablet Take 0.6 mg by mouth daily as needed.   . fluticasone (FLONASE) 50 MCG/ACT nasal spray Use 2 sprays in  each nostril daily 05/01/2019: Reports taking as needed  . furosemide (LASIX) 40 MG tablet Take 1 tablet (40 mg total) by mouth 2 (two) times a week.   Marland Kitchen glipiZIDE (GLUCOTROL XL) 2.5 MG 24 hr tablet Take 1 tablet (2.5 mg total) by mouth daily with breakfast.   . glucose blood test strip Use as instructed up to 4x daily. E11.65   . Insulin Glargine (BASAGLAR KWIKPEN) 100 UNIT/ML SOPN Inject 0.1 mLs (10 Units total) into the skin daily.   . Lancets Misc. MISC Use as directed to check blood sugar up to 4x daily. E11.65   . levothyroxine (SYNTHROID) 75 MCG tablet Take 1 tablet (75 mcg total) by mouth daily before breakfast.   . pantoprazole (PROTONIX) 20 MG tablet Take 1 tablet (20 mg total) by mouth daily. 06/27/2019: Takes as needed  . prednisoLONE acetate (PRED FORTE) 1 % ophthalmic suspension Place 1 drop into the right eye 4 (four) times daily.   Marland Kitchen ULTICARE MICRO PEN NEEDLES 32G X 4 MM MISC TWICE DAILY    No facility-administered encounter medications on file as of 12/13/2019.    Functional Status:  In your present state of health, do you have any difficulty performing the following activities: 06/27/2019  Hearing? N  Vision? Y  Comment difficulty seeing  Difficulty concentrating or making decisions? N  Walking or climbing stairs? N  Dressing or bathing? N  Doing errands, shopping? N  Preparing Food and eating ? N  Using  the Toilet? N  In the past six months, have you accidently leaked urine? N  Do you have problems with loss of bowel control? N  Managing your Medications? N  Managing your Finances? N  Housekeeping or managing your Housekeeping? N  Some recent data might be hidden    Fall/Depression Screening: Fall Risk  12/11/2019 09/17/2019 06/27/2019  Falls in the past year? 0 0 0  Number falls in past yr: 0 - 0  Injury with Fall? 0 - 0  Comment - - -  Risk Factor Category  - - -  Risk for fall due to : Impaired vision;Medication side effect;Impaired balance/gait - Medication side  effect;Impaired balance/gait;Impaired mobility  Follow up Falls evaluation completed;Education provided;Falls prevention discussed - Falls evaluation completed;Education provided;Falls prevention discussed   PHQ 2/9 Scores 09/17/2019 06/27/2019 11/20/2018 07/28/2018 03/01/2018 10/20/2017 06/28/2017  PHQ - 2 Score 0 0 0 0 0 0 0  PHQ- 9 Score - - - - - - -   Goals Addressed              This Visit's Progress   .  Patient will report decreasing Hgb A1C by 0.2 points within the next 90 days. (pt-stated)        CARE PLAN ENTRY (see longtitudinal plan of care for additional care plan information)  Objective:  Lab Results  Component Value Date   HGBA1C 8.1 (H) 09/17/2019 .   Lab Results  Component Value Date   CREATININE 1.53 (H) 09/17/2019   CREATININE 1.53 (H) 04/03/2019   CREATININE 1.44 (H) 11/20/2018 .   Marland Kitchen No results found for: EGFR  Current Barriers:  Marland Kitchen Knowledge Deficits related to basic Diabetes pathophysiology and self care/management . Knowledge Deficits related to medications used for management of diabetes . Vision Barriers (having trouble with eyesight at this time)  Case Manager Clinical Goal(s):  Over the next 90 days, patient will demonstrate improved adherence to prescribed treatment plan for diabetes self care/management as evidenced by: decreasing Hgb A1C by 0.2 points  . Verbalize daily monitoring and recording of CBG within 30 days . Verbalize adherence to ADA/ carb modified diet within the next 30 days . Verbalize adherence to prescribed medication regimen within the next 30 days  . Patient will verbalize discussing new meter/monitoring system with provider that will call out her readings since she is having trouble seeing machine . Verbalize receiving assistance from family/husband with completing application over telephone for [facility] transportation to medical appointments  Interventions:  . Provided education to patient about basic DM disease process . Reviewed  medications with patient and discussed importance of medication adherence . Discussed plans with patient for ongoing care management follow up and provided patient with direct contact information for care management team . Provided patient with written educational materials related to hypo and hyperglycemia and importance of correct treatment . Reviewed scheduled/upcoming provider appointments including: primary care and endocrinology appointments . Offered to call [facility] to assist patient to do application over telephone (patient declines at this time); sent patient [facility] brochure and explained to her how to complete application over the telephone; encouraged to contact this [place] if she has questions or concerns . Sent message to CCM Embedded Social Forensic psychologist at [facility] patient may need assistance with transportation application  Patient Self Care Activities:  . Self administers oral medications as prescribed . Self administers insulin as prescribed . Attends all scheduled provider appointments . Checks blood sugars as prescribed and  utilize hyper and hypoglycemia protocol as needed . Adheres to prescribed ADA/carb modified . Contacts [facility] to complete transportation application over telephone  Initial goal documentation       Appointments:  Attended appointment with primary care provider, Dr. Lajuana Ripple on 09/17/2019 and Endocrinologist, Dr. Dorris Fetch on 10/09/2019.  Has follow up with Dr. Dorris Fetch on 02/14/2020.  Plan: RN Health Coach will send primary care quarterly update. RN Health Coach will send patient Aging, Disability, and Transit of [facility]. RN Health Coach will make next telephone outreach to patient within the month of August and patient agrees to future outreach. RN Health Coach will send CCM Embedded Social Worker [place] message to assist patient on Transportation application if needed.  [place] [phone] Jaydy [email or web address]

## 2019-12-18 ENCOUNTER — Other Ambulatory Visit: Payer: Self-pay | Admitting: Family Medicine

## 2019-12-20 ENCOUNTER — Encounter (INDEPENDENT_AMBULATORY_CARE_PROVIDER_SITE_OTHER): Payer: Medicare Other | Admitting: Ophthalmology

## 2019-12-24 ENCOUNTER — Encounter (INDEPENDENT_AMBULATORY_CARE_PROVIDER_SITE_OTHER): Payer: Medicare Other | Admitting: Ophthalmology

## 2019-12-28 NOTE — Telephone Encounter (Signed)
Call placed to patient to check in on glucometer options Phone line was busy x2

## 2019-12-31 ENCOUNTER — Encounter (INDEPENDENT_AMBULATORY_CARE_PROVIDER_SITE_OTHER): Payer: Medicare Other | Admitting: Ophthalmology

## 2020-01-14 ENCOUNTER — Other Ambulatory Visit: Payer: Self-pay | Admitting: *Deleted

## 2020-01-14 ENCOUNTER — Ambulatory Visit (INDEPENDENT_AMBULATORY_CARE_PROVIDER_SITE_OTHER): Payer: Medicare Other | Admitting: Ophthalmology

## 2020-01-14 ENCOUNTER — Encounter (INDEPENDENT_AMBULATORY_CARE_PROVIDER_SITE_OTHER): Payer: Self-pay | Admitting: Ophthalmology

## 2020-01-14 ENCOUNTER — Other Ambulatory Visit: Payer: Self-pay

## 2020-01-14 DIAGNOSIS — Z794 Long term (current) use of insulin: Secondary | ICD-10-CM | POA: Diagnosis not present

## 2020-01-14 DIAGNOSIS — T8522XA Displacement of intraocular lens, initial encounter: Secondary | ICD-10-CM

## 2020-01-14 DIAGNOSIS — E113553 Type 2 diabetes mellitus with stable proliferative diabetic retinopathy, bilateral: Secondary | ICD-10-CM

## 2020-01-14 DIAGNOSIS — H35351 Cystoid macular degeneration, right eye: Secondary | ICD-10-CM | POA: Diagnosis not present

## 2020-01-14 MED ORDER — KETOROLAC TROMETHAMINE 0.5 % OP SOLN
1.0000 [drp] | Freq: Two times a day (BID) | OPHTHALMIC | 12 refills | Status: DC
Start: 2020-01-14 — End: 2020-04-21

## 2020-01-14 NOTE — Assessment & Plan Note (Signed)

## 2020-01-14 NOTE — Progress Notes (Signed)
01/14/2020     CHIEF COMPLAINT Patient presents for Post-op Follow-up   HISTORY OF PRESENT ILLNESS: [name] is a 74 y.o. female who presents to the clinic today for:   HPI    Post-op Follow-up    In right eye.  Vision is stable.          Comments    7 Week POV OD  Pt sts [facility] stable OU. Pt denies ocular pain, flashes of light, or floaters OU. Pt sts she is off all her eye drops. LBS: 114 this AM        Last edited by Rockie Neighbours, [place] on 01/14/2020  8:28 AM. (History)      Referring physician: [name], DO [place],  [place] [address]  HISTORICAL INFORMATION:   Selected notes from the MEDICAL RECORD NUMBER    Lab Results  Component Value Date   HGBA1C 8.1 (H) 09/17/2019     CURRENT MEDICATIONS: Current Outpatient Medications (Ophthalmic Drugs)  Medication Sig  . prednisoLONE acetate (PRED FORTE) 1 % ophthalmic suspension Place 1 drop into the right eye 4 (four) times daily. (Patient not taking: Reported on 01/14/2020)   No current facility-administered medications for this visit. (Ophthalmic Drugs)   Current Outpatient Medications (Other)  Medication Sig  . furosemide (LASIX) 40 MG tablet TAKE 1 TABLET TWICE WEEKLY  . Accu-Chek Softclix Lancets lancets Use as instructed 2 x daily  E11.65  . amLODipine (NORVASC) 5 MG tablet Take 1 tablet (5 mg total) by mouth daily.  Marland Kitchen atorvastatin (LIPITOR) 10 MG tablet Take 1 tablet (10 mg total) by mouth daily.  . blood glucose meter kit and supplies Dispense based on patient and insurance preference. Use up to four times daily as directed. (FOR ICD-10 E10.9, E11.9).  . carvedilol (COREG) 6.25 MG tablet Take 1 tablet (6.25 mg total) by mouth 2 (two) times daily with a meal.  . Cholecalciferol 25 MCG (1000 UT) capsule Take 1 capsule (1,000 Units total) by mouth daily.  . colchicine 0.6 MG tablet Take 0.6 mg by mouth daily as needed.  . fluticasone (FLONASE) 50 MCG/ACT nasal spray Use 2 sprays in  each nostril daily  . glipiZIDE (GLUCOTROL XL) 2.5 MG 24 hr tablet Take 1 tablet (2.5 mg total) by mouth daily with breakfast.  . glucose blood test strip Use as instructed up to 4x daily. E11.65  . Insulin Glargine (BASAGLAR KWIKPEN) 100 UNIT/ML SOPN Inject 0.1 mLs (10 Units total) into the skin daily.  . Lancets Misc. MISC Use as directed to check blood sugar up to 4x daily. E11.65  . levothyroxine (SYNTHROID) 75 MCG tablet Take 1 tablet (75 mcg total) by mouth daily before breakfast.  . pantoprazole (PROTONIX) 20 MG tablet Take 1 tablet (20 mg total) by mouth daily.  Marland Kitchen ULTICARE MICRO PEN NEEDLES 32G X 4 MM MISC TWICE DAILY   No current facility-administered medications for this visit. (Other)      REVIEW OF SYSTEMS:    ALLERGIES Allergies  Allergen Reactions  . Aspirin Nausea And Vomiting  . Ciprofloxacin Other (See Comments)    Upset Stomach  . Codeine Nausea And Vomiting    Makes me sick   . Losartan     Hyperkalemia   . Micardis [Telmisartan] Other (See Comments)    unknown  . Nexium [Esomeprazole Magnesium] Other (See Comments)    Causes internal bleeding  . Niaspan [Niacin Er] Other (See Comments)    Reaction is  unknown  . Onglyza [Saxagliptin] Other (See Comments)    Reaction is unknown  . Other     No otc pain medications  . Rofecoxib Other (See Comments)    unknown  . Simvastatin   . Tequin [Gatifloxacin] Other (See Comments)    Reaction is unknown  . Welchol [Colesevelam Hcl]   . Amoxicillin Nausea And Vomiting and Rash    Has patient had a PCN reaction causing immediate rash, facial/tongue/throat swelling, SOB or lightheadedness with hypotension: Yes Has patient had a PCN reaction causing severe rash involving mucus membranes or skin necrosis: no  Has patient had a PCN reaction that required hospitalization No Has patient had a PCN reaction occurring within the last 10 years: No If all of the above answers are "NO", then may proceed with Cephalosporin  use.     PAST MEDICAL HISTORY Past Medical History:  Diagnosis Date  . Anal fistula   . Anemia in chronic renal disease    Aranesp injection --  when Hg <11, last injection 12-24-14.  Marland Kitchen Anxiety   . Arthritis    knees and hand/fingers. "broke back"-being evaluated for this"weakness left leg"  . Cataract    both eyes done  . 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 CKD (chronic kidney disease), stage III    nephrologist--  dr Mercy Moore-- LOV  07-09-2016  . Complication of anesthesia    post-op confusion   . Constipation   . Coronary artery disease   . Diabetic gastroparesis ([place])   . Diabetic retinopathy ([place])    bilateral --  monitored by dr [name]  . Diverticulosis of colon   . GAD (generalized anxiety disorder)   . GERD (gastroesophageal reflux disease)   . History of colon polyps    benign  . History of esophagitis   . History of GI bleed    upper 2009  due to esophagitis  &  2002  due to Mallory-Weiss tear  . History of hyperkalemia    pt had previously been canceled twice dos due to elevated K+, 07-29-2016 last date cancelled -- pt visited pcp same day treated w/ kayexelate and K+ came down, pt brought all her medication's in to pcp office and found pt was taking losartan that had been discontinued due to ckd, pcp stated this was cause of elevated K+  . 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 History of rectal abscess    12-29-2004  bedside I & D  . Hyperlipidemia   . Hypertension   . Hypothyroidism   . LAFB (left anterior fascicular block)   . Right bundle branch block   . Sacral decubitus ulcer    since 2014- 01-02-15 remains with wound" gauze dressing changes daily.  . Type II diabetes mellitus ([place])    Past Surgical History:  Procedure Laterality Date  . APPLICATION OF A-CELL OF EXTREMITY N/A 04/07/2015   Procedure: A CELL PLACMENT;  Surgeon: Loel Lofty Dillingham, DO;  Location: [facility];  Service: Plastics;   Laterality: N/A;  . CARDIOVASCULAR STRESS TEST  12-30-2004   normal perfusion study/  no ischemia or infartion/  normal LV wall function and wall motion , ef 66%  . CATARACT EXTRACTION W/ INTRAOCULAR LENS  IMPLANT, BILATERAL  1995  . COLONOSCOPY  2008   w/Dr.Brodie   . COLONOSCOPY W/ POLYPECTOMY  last one 2008  . COMPRESSION HIP SCREW Right 05/18/2014   Procedure: COMPRESSION HIP;  Surgeon: Carole Civil, MD;  Location: [facility];  Service: Orthopedics;  Laterality: Right;  . ESOPHAGOGASTRODUODENOSCOPY  last one 01-09-2011  . EVALUATION UNDER ANESTHESIA WITH FISTULECTOMY N/A 01/06/2015   Procedure: EXAM UNDER ANESTHESIA , placement of seton;  Surgeon: Jackolyn Confer, MD;  Location: [facility];  Service: General;  Laterality: N/A;  . I & D EXTREMITY N/A 04/07/2015   Procedure: IRRIGATION AND DEBRIDEMENT ISCHIAL ULCER;  Surgeon: Loel Lofty Dillingham, DO;  Location: [facility];  Service: Plastics;  Laterality: N/A;  . INCISION AND DRAINAGE OF WOUND N/A 09/30/2014   Procedure: IRRIGATION AND DEBRIDEMENT SACRAL WOUND, EXCISION OF PERIRECTAL TRACT WITH PLACEMENT OF ACCELL;  Surgeon: Theodoro Kos, DO;  Location: [facility];  Service: Plastics;  Laterality: N/A;  . LIGATION OF INTERNAL FISTULA TRACT N/A 10/22/2016   Procedure: LIGATION OF INTERNAL FISTULA TRACT;  Surgeon: Leighton Ruff, MD;  Location: [facility];  Service: General;  Laterality: N/A;  . ORIF FEMUR FRACTURE Left 10/09/2012   Procedure: OPEN REDUCTION INTERNAL FIXATION (ORIF) DISTAL FEMUR FRACTURE;  Surgeon: Rozanna Box, MD;  Location: [facility];  Service: Orthopedics;  Laterality: Left;  . RETINOPATHY SURGERY Bilateral 1980's?  . TRANSTHORACIC ECHOCARDIOGRAM  02-18-2011   mild LVH,  ef 55-60%,  grade I diastolic dysfunction/  mild TR/  RV systolic pressure increased consistant with moderate pulmonary hypertension    FAMILY HISTORY Family History  Problem Relation Age of Onset  . Colon cancer Father 8  .  Prostate cancer Father   . Diabetes Father   . Coronary artery disease Mother 66  . Heart disease Mother   . Diabetes Mother   . Coronary artery disease Sister 10  . Diabetes Sister   . Heart disease Sister   . Diabetes Sister   . Diabetes Sister   . Diabetes Sister   . Diabetes Sister   . Diabetes Sister   . Diabetes Maternal Grandmother   . Esophageal cancer Neg Hx   . Stomach cancer Neg Hx   . Rectal cancer Neg Hx     SOCIAL HISTORY Social History   Tobacco Use  . Smoking status: Never Smoker  . Smokeless tobacco: Never Used  Vaping Use  . Vaping Use: Never used  Substance Use Topics  . Alcohol use: No  . Drug use: No         OPHTHALMIC EXAM:  Base Eye Exam    Visual Acuity (ETDRS)      Right Left   Dist [place] 20/400 20/100 +1   Dist ph [place] 20/100 +1 NI       Tonometry (Tonopen, 8:28 AM)      Right Left   Pressure 11 12       Pupils      Dark Light Shape React APD   Right 3 3 Round Minimal None   Left 2 2 Round Minimal None       Visual Fields (Counting fingers)      Left Right   Restrictions Partial outer superior temporal, inferior temporal, superior nasal, inferior nasal deficiencies Partial outer superior temporal, inferior temporal, superior nasal, inferior nasal deficiencies       Extraocular Movement      Right Left    Full Full       Neuro/Psych    Oriented x3: Yes   Mood/Affect: Normal       Dilation    Right eye: 1.0% Mydriacyl, 2.5% Phenylephrine @ 8:31 AM  Slit Lamp and Fundus Exam    External Exam      Right Left   External Normal Normal       Slit Lamp Exam      Right Left   Lids/Lashes Normal Normal   Conjunctiva/Sclera White and quiet, no exposed haptic bulbs. White and quiet, no exposed haptic bulbs.   Cornea Clear Clear   Anterior Chamber Deep and quiet Deep and quiet   Iris Round and reactive, PI inferonasal patent Round and reactive   Lens Centered posterior chamber intraocular lens,, scleral tunnel  haptic fixation, no iris chafe Centered posterior chamber intraocular lens   Anterior Vitreous Normal Normal       Fundus Exam      Right Left   Posterior Vitreous Clear, vitrectomized Clear, vitrectomized   Disc Normal Normal   C/D Ratio 0.5 0.5   Macula Normal Normal   Vessels PDR quiescent PDR quiescent   Periphery PRP 360.  attached, lesion creep over decades,  enlarging laser  scars PRP 360.  attached, lesion creep over decades,  enlarging laser  scars          IMAGING AND PROCEDURES  Imaging and Procedures for 01/14/20  OCT, Retina - OU - Both Eyes       Right Eye Quality was good. Scan locations included subfoveal. Central Foveal Thickness: 210. Findings include cystoid macular edema.   Left Eye Quality was good. Scan locations included subfoveal. Central Foveal Thickness: 181. Progression has been stable.   Notes Minor perifoveal CME on the nasal aspect of the foveal avascular zone we will treat topically with NSAIDs                ASSESSMENT/PLAN:  Controlled type 2 diabetes mellitus with stable proliferative retinopathy of both eyes, with long-term current use of insulin (HCC) The nature of regressed proliferative diabetic retinopathy was discussed with the patient. The patient was advised to maintain good glucose, blood pressure, monitor kidney function and serum lipid control as advised by personal physician. Rare risk for reactivation of progression exist with untreated severe anemia, untreated renal failure, untreated heart failure, and smoking. Complete avoidance of smoking was recommended. The chance of recurrent proliferative diabetic retinopathy was discussed as well as the chance of vitreous hemorrhage for which further treatments may be necessary.   Explained to the patient that the quiescent  proliferative diabetic retinopathy disease is unlikely to ever worsen.  Worsening factors would include however severe anemia, hypertension out-of-control or  impending renal failure.      ICD-10-CM   1. Dislocated intraocular lens, initial encounter  T85.22XA OCT, Retina - OU - Both Eyes  2. Cystoid macular edema, right eye  H35.351   3. Controlled type 2 diabetes mellitus with stable proliferative retinopathy of both eyes, with long-term current use of insulin ([place])  V67.0141    Z79.4     1.  OD, acuity has improved nicely, will need refraction and we will asked patient to follow-up with Dr. Carolynn Sayers for this need OD, OS 2.  Cystoid macular edema minor CME OD, will start treatment with topical NSAID only twice daily  3.  Ophthalmic Meds Ordered this visit:  No orders of the defined types were placed in this encounter.      Return in about 3 months (around 04/15/2020) for dilate, OD, OCT.  There are no Patient Instructions on file for this visit.   Explained the diagnoses, plan, and follow up with the patient and they expressed  understanding.  Patient expressed understanding of the importance of proper follow up care.   Clent Demark Rahi Chandonnet M.D. Diseases & Surgery of the Retina and Vitreous Retina & Diabetic [place] 01/14/20     Abbreviations: M myopia (nearsighted); A astigmatism; H hyperopia (farsighted); P presbyopia; Mrx spectacle prescription;  CTL contact lenses; OD right eye; OS left eye; OU both eyes  XT exotropia; ET esotropia; PEK punctate epithelial keratitis; PEE punctate epithelial erosions; DES dry eye syndrome; MGD meibomian gland dysfunction; ATs artificial tears; PFAT's preservative free artificial tears; [place] nuclear sclerotic cataract; PSC posterior subcapsular cataract; ERM epi-retinal membrane; PVD posterior vitreous detachment; RD retinal detachment; DM diabetes mellitus; DR diabetic retinopathy; NPDR non-proliferative diabetic retinopathy; PDR proliferative diabetic retinopathy; CSME clinically significant macular edema; DME diabetic macular edema; dbh dot blot hemorrhages; CWS cotton wool spot; POAG primary open angle  glaucoma; C/D cup-to-disc ratio; HVF humphrey visual field; GVF goldmann visual field; OCT optical coherence tomography; IOP intraocular pressure; BRVO Branch retinal vein occlusion; CRVO central retinal vein occlusion; CRAO central retinal artery occlusion; BRAO branch retinal artery occlusion; RT retinal tear; SB scleral buckle; PPV pars plana vitrectomy; VH Vitreous hemorrhage; PRP panretinal laser photocoagulation; IVK intravitreal kenalog; VMT vitreomacular traction; MH Macular hole;  NVD neovascularization of the disc; NVE neovascularization elsewhere; AREDS age related eye disease study; ARMD age related macular degeneration; POAG primary open angle glaucoma; EBMD epithelial/anterior basement membrane dystrophy; ACIOL anterior chamber intraocular lens; IOL intraocular lens; PCIOL posterior chamber intraocular lens; Phaco/IOL phacoemulsification with intraocular lens placement; [place] photorefractive keratectomy; LASIK laser assisted in situ keratomileusis; HTN hypertension; DM diabetes mellitus; COPD chronic obstructive pulmonary disease

## 2020-01-14 NOTE — Patient Outreach (Signed)
[facility]) Care Management  01/14/2020  [name] [birth year] [id number]   [place]  Referral Date:06/24/2017 Referral Source:MD Office Reason for Referral:"consult for medications, too expensive especially insulin" Insurance:Medicare   Outreach Attempt:  Outreach attempt #1 to patient for follow up.  Multiple call attempts with line ringing busy.  Plan:  RN Health Coach will make another outreach attempt within the month of September.    [place] [phone] [name]

## 2020-01-17 ENCOUNTER — Encounter (INDEPENDENT_AMBULATORY_CARE_PROVIDER_SITE_OTHER): Payer: Medicare Other | Admitting: Ophthalmology

## 2020-01-30 ENCOUNTER — Other Ambulatory Visit: Payer: Self-pay

## 2020-01-30 ENCOUNTER — Encounter: Payer: Self-pay | Admitting: Family Medicine

## 2020-01-30 ENCOUNTER — Ambulatory Visit (INDEPENDENT_AMBULATORY_CARE_PROVIDER_SITE_OTHER): Payer: Medicare Other | Admitting: Family Medicine

## 2020-01-30 VITALS — BP 146/66 | HR 63 | Temp 97.7°F | Ht 63.0 in | Wt 152.0 lb

## 2020-01-30 DIAGNOSIS — E1122 Type 2 diabetes mellitus with diabetic chronic kidney disease: Secondary | ICD-10-CM

## 2020-01-30 DIAGNOSIS — E1121 Type 2 diabetes mellitus with diabetic nephropathy: Secondary | ICD-10-CM | POA: Diagnosis not present

## 2020-01-30 DIAGNOSIS — E1159 Type 2 diabetes mellitus with other circulatory complications: Secondary | ICD-10-CM

## 2020-01-30 DIAGNOSIS — I10 Essential (primary) hypertension: Secondary | ICD-10-CM | POA: Diagnosis not present

## 2020-01-30 DIAGNOSIS — Z794 Long term (current) use of insulin: Secondary | ICD-10-CM

## 2020-01-30 DIAGNOSIS — Z7189 Other specified counseling: Secondary | ICD-10-CM | POA: Diagnosis not present

## 2020-01-30 DIAGNOSIS — E034 Atrophy of thyroid (acquired): Secondary | ICD-10-CM

## 2020-01-30 DIAGNOSIS — E1169 Type 2 diabetes mellitus with other specified complication: Secondary | ICD-10-CM | POA: Diagnosis not present

## 2020-01-30 DIAGNOSIS — N1832 Chronic kidney disease, stage 3b: Secondary | ICD-10-CM

## 2020-01-30 DIAGNOSIS — E785 Hyperlipidemia, unspecified: Secondary | ICD-10-CM | POA: Diagnosis not present

## 2020-01-30 LAB — BAYER DCA HB A1C WAIVED: HB A1C (BAYER DCA - WAIVED): 7.5 % — ABNORMAL HIGH (ref <7.0)

## 2020-01-30 MED ORDER — PANTOPRAZOLE SODIUM 20 MG PO TBEC
20.0000 mg | DELAYED_RELEASE_TABLET | Freq: Every day | ORAL | 3 refills | Status: DC
Start: 2020-01-30 — End: 2020-10-01

## 2020-01-30 NOTE — Progress Notes (Signed)
.   Subjective: CC: f/u DM, HTN, HLD PCP: [name], DO [name] is a 74 y.o. female presenting to clinic today for:  1. Type 2 Diabetes with hypertension, hyperlipidemia, DM retinopathy and CKD 4:  Seeing Dr Dorris Fetch on 9/23.  She is compliant with all of her medications.  She admits that her diet was not that good when her husband was hospitalized with sepsis.  Does not report any new symptoms including chest pain, shortness of breath, falls, blurred vision, thirst or increased urinary frequency.  Last eye exam: UTD Last foot exam: Needs Last A1c:   Lab Results  Component Value Date   HGBA1C 8.1 (H) 09/17/2019   Nephropathy screen indicated?: Plans with Nephrology next week, has allergy to ARB/ ACE-I.  She has known chronic kidney disease and is followed by renal (Dr Clover Mealy) in [place].   Last flu, zoster and/or pneumovax:  Immunization History  Administered Date(s) Administered   Fluad Quad(high Dose 65+) 02/05/2019   Influenza, High Dose Seasonal PF 03/05/2016, 03/14/2017, 03/01/2018   Influenza,inj,Quad PF,6+ Mos 02/16/2013, 02/27/2015   PFIZER SARS-COV-2 Vaccination 06/13/2019, 07/04/2019   Pneumococcal Conjugate-13 10/11/2014   Pneumococcal Polysaccharide-23 10/10/2012   Tdap 10/06/2012   2. Hypothyroidism Compliant with synthroid. No tremor, change in voice.  No changes in bowel habit, weight.  ROS: Per HPI  Allergies  Allergen Reactions   Aspirin Nausea And Vomiting   Ciprofloxacin Other (See Comments)    Upset Stomach   Codeine Nausea And Vomiting    Makes me sick    Losartan     Hyperkalemia    Micardis [Telmisartan] Other (See Comments)    unknown   Nexium [Esomeprazole Magnesium] Other (See Comments)    Causes internal bleeding   Niaspan [Niacin Er] Other (See Comments)    Reaction is unknown   Onglyza [Saxagliptin] Other (See Comments)    Reaction is unknown   Other     No otc pain medications   Rofecoxib  Other (See Comments)    unknown   Simvastatin    Tequin [Gatifloxacin] Other (See Comments)    Reaction is unknown   Welchol [Colesevelam Hcl]    Amoxicillin Nausea And Vomiting and Rash    Has patient had a PCN reaction causing immediate rash, facial/tongue/throat swelling, SOB or lightheadedness with hypotension: Yes Has patient had a PCN reaction causing severe rash involving mucus membranes or skin necrosis: no  Has patient had a PCN reaction that required hospitalization No Has patient had a PCN reaction occurring within the last 10 years: No If all of the above answers are "NO", then may proceed with Cephalosporin use.    Past Medical History:  Diagnosis Date   Anal fistula    Anemia in chronic renal disease    Aranesp injection --  when Hg <11, last injection 12-24-14.   Anxiety    Arthritis    knees and hand/fingers. 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    CKD (chronic kidney disease), stage III    nephrologist--  dr Mercy Moore-- [facility]  [id number]   Complication of anesthesia    post-op confusion    Constipation    Coronary artery disease    Diabetic gastroparesis ([place])    Diabetic retinopathy ([place])    bilateral --  monitored by dr [name]   Diverticulosis of colon    GAD (generalized  anxiety disorder)    GERD (gastroesophageal reflux disease)    History of colon polyps    benign   History of esophagitis    History of GI bleed    upper 2009  due to esophagitis  &  2002  due to Mallory-Weiss tear   History of hyperkalemia    pt had previously been canceled twice dos due to elevated K+, 07-29-2016 last date cancelled -- pt visited pcp same day treated w/ kayexelate and K+ came down, pt brought all her medication's in to pcp office and found pt was taking losartan that had been discontinued due to ckd, pcp stated this was cause of  elevated K+   History of Mallory-Weiss syndrome    12/ 2002--  resolved   History of rectal abscess    12-29-2004  bedside I & D   Hyperlipidemia    Hypertension    Hypothyroidism    LAFB (left anterior fascicular block)    Right bundle branch block    Sacral decubitus ulcer    since 2014- 01-02-15 remains with wound" gauze dressing changes daily.   Type II diabetes mellitus (HCC)     Current Outpatient Medications:    Accu-Chek Softclix Lancets lancets, Use as instructed 2 x daily  E11.65, Disp: 100 each, Rfl: 5   amLODipine (NORVASC) 5 MG tablet, Take 1 tablet (5 mg total) by mouth daily., Disp: 90 tablet, Rfl: 3   atorvastatin (LIPITOR) 10 MG tablet, Take 1 tablet (10 mg total) by mouth daily., Disp: 90 tablet, Rfl: 3   blood glucose meter kit and supplies, Dispense based on patient and insurance preference. Use up to four times daily as directed. (FOR ICD-10 E10.9, E11.9)., Disp: 1 each, Rfl: 0   carvedilol (COREG) 6.25 MG tablet, Take 1 tablet (6.25 mg total) by mouth 2 (two) times daily with a meal., Disp: 180 tablet, Rfl: 3   Cholecalciferol 25 MCG (1000 UT) capsule, Take 1 capsule (1,000 Units total) by mouth daily., Disp: 90 capsule, Rfl: 3   colchicine 0.6 MG tablet, Take 0.6 mg by mouth daily as needed., Disp: , Rfl:    fluticasone (FLONASE) 50 MCG/ACT nasal spray, Use 2 sprays in each nostril daily, Disp: , Rfl:    furosemide (LASIX) 40 MG tablet, TAKE 1 TABLET TWICE WEEKLY, Disp: 26 tablet, Rfl: 2   glipiZIDE (GLUCOTROL XL) 2.5 MG 24 hr tablet, Take 1 tablet (2.5 mg total) by mouth daily with breakfast., Disp: 90 tablet, Rfl: 1   glucose blood test strip, Use as instructed up to 4x daily. E11.65, Disp: 300 each, Rfl: 5   Insulin Glargine (BASAGLAR KWIKPEN) 100 UNIT/ML SOPN, Inject 0.1 mLs (10 Units total) into the skin daily., Disp: 15 mL, Rfl: 1   ketorolac (ACULAR) 0.5 % ophthalmic solution, Place 1 drop into the right eye 2 (two) times daily., Disp: 5  mL, Rfl: 12   Lancets Misc. MISC, Use as directed to check blood sugar up to 4x daily. E11.65, Disp: 300 each, Rfl: 3   levothyroxine (SYNTHROID) 75 MCG tablet, Take 1 tablet (75 mcg total) by mouth daily before breakfast., Disp: 90 tablet, Rfl: 3   pantoprazole (PROTONIX) 20 MG tablet, Take 1 tablet (20 mg total) by mouth daily., Disp: 90 tablet, Rfl: 3   prednisoLONE acetate (PRED FORTE) 1 % ophthalmic suspension, Place 1 drop into the right eye 4 (four) times daily., Disp: 5 mL, Rfl: 0   ULTICARE MICRO PEN NEEDLES 32G X 4 MM MISC, TWICE DAILY,  Disp: 100 each, Rfl: 5 Social History   Socioeconomic History   Marital status: Married    Spouse name: [name]   Number of children: 0   Years of education: 12   Highest education level: Not on file  Occupational History   Occupation: Retired    Fish farm manager: RETIRED  Tobacco Use   Smoking status: Never Smoker   Smokeless tobacco: Never Used  Scientific laboratory technician Use: Never used  Substance and Sexual Activity   Alcohol use: No   Drug use: No   Sexual activity: Not Currently  Other Topics Concern   Not on file  Social History Narrative   Lives with husband.    Caffeine use: none   Social Determinants of Radio broadcast assistant Strain: Low Risk    Difficulty of Paying Living Expenses: Not hard at all  Food Insecurity: No Food Insecurity   Worried About Charity fundraiser in the Last Year: Never true   Arboriculturist in the Last Year: Never true  Transportation Needs: Unmet Transportation Needs   Lack of Transportation (Medical): Yes   Lack of Transportation (Non-Medical): No  Physical Activity: Inactive   Days of Exercise per Week: 0 days   Minutes of Exercise per Session: 0 min  Stress:    Feeling of Stress : Not on file  Social Connections:    Frequency of Communication with Friends and Family: Not on file   Frequency of Social Gatherings with Friends and Family: Not on file   Attends Religious  Services: Not on file   Active Member of Clubs or Organizations: Not on file   Attends Archivist Meetings: Not on file   Marital Status: Not on file  Intimate Partner Violence: Not At Risk   Fear of Current or Ex-Partner: No   Emotionally Abused: No   Physically Abused: No   Sexually Abused: No   Family History  Problem Relation Age of Onset   Colon cancer Father 79   Prostate cancer Father    Diabetes Father    Coronary artery disease Mother 26   Heart disease Mother    Diabetes Mother    Coronary artery disease Sister 70   Diabetes Sister    Heart disease Sister    Diabetes Sister    Diabetes Sister    Diabetes Sister    Diabetes Sister    Diabetes Sister    Diabetes Maternal Grandmother    Esophageal cancer Neg Hx    Stomach cancer Neg Hx    Rectal cancer Neg Hx     Objective: Office vital signs reviewed. BP (!) 146/66    Pulse 63    Temp 97.7 F (36.5 C) (Temporal)    Ht '5\' 3"'  (1.6 m)    Wt 152 lb (68.9 kg)    SpO2 99%    BMI 26.93 kg/m   Physical Examination:  General: Awake, alert, chronically ill appearing female, No acute distress HEENT: Normal, sclera white, MMM; no exophthalmos.  No goiter Cardio: regular rate and rhythm, S1S2 heard, no murmurs appreciated Pulm: clear to auscultation bilaterally, no wheezes, rhonchi or rales; normal work of breathing on room air Neuro: no tremor  Assessment/ Plan: 74 y.o. female   Type 2 diabetes mellitus with stage 3b chronic kidney disease, with long-term current use of insulin (HCC) - Plan: Bayer DCA Hb A1c Waived, Microalbumin / creatinine urine ratio  Hyperlipidemia associated with type 2 diabetes mellitus ([place])  Hypertension  associated with diabetes ([place])  Hypothyroidism due to acquired atrophy of thyroid - Plan: Thyroid Panel With TSH  Educated about COVID-19 virus infection  A1c has come down since her last visit to 7.5.  I will CC that her endocrinologist, with whom she  has an appointment coming up.  I have also ordered a thyroid panel.  She will have her kidneys checked with her nephrologist next week.  I asked that they obtain urine microalbumin and send me the results of this.  She is to continue following up with Dr. Katy Fitch and Dr. [name].  She has refills through April of next year and all of her medicines with the exception of Synthroid.  We will plan to renew this once her thyroid labs return.  Encouraged COVID19 booster once available.  Orders Placed This Encounter  Procedures   Bayer DCA Hb A1c Waived   Microalbumin / creatinine urine ratio   Thyroid Panel With TSH    Order Specific Question:   CC Results    Answer:   NIDA, Marella Chimes [[id number]]   No orders of the defined types were placed in this encounter.    [name], DO [place] [phone]

## 2020-01-31 LAB — THYROID PANEL WITH TSH
Free Thyroxine Index: 2.7 (ref 1.2–4.9)
T3 Uptake Ratio: 32 % (ref 24–39)
T4, Total: 8.4 ug/dL (ref 4.5–12.0)
TSH: 0.988 u[IU]/mL (ref 0.450–4.500)

## 2020-02-06 ENCOUNTER — Other Ambulatory Visit: Payer: Self-pay | Admitting: *Deleted

## 2020-02-06 ENCOUNTER — Ambulatory Visit: Payer: Medicare Other | Admitting: *Deleted

## 2020-02-06 DIAGNOSIS — Z794 Long term (current) use of insulin: Secondary | ICD-10-CM

## 2020-02-06 DIAGNOSIS — E1159 Type 2 diabetes mellitus with other circulatory complications: Secondary | ICD-10-CM

## 2020-02-06 NOTE — Patient Outreach (Addendum)
[name]) Care Management  02/06/2020  [name] December 12, 1945 [id number]   RN Health CoachMonthlyOutreach  Referral Date:06/24/2017 Referral Source:MD Office Reason for Referral:"consult for medications, too expensive especially insulin" Insurance:Medicare   Addendum:  Received message from [name], she will assist patient in the chronic care manager program.  [name] will close Disease Management case at this time.  RN Health Coach will send provider Case Closure Letter.   Outreach Attempt:  Successful telephone outreach to patient for follow up.  HIPAA verified with patient.  Patient reporting she still has not contacted [name] for transportation application.  States her sister has taken her to her medical appointments the past week and is planning to take her to the upcoming medical appointments in the next week.  Patient is reporting her husband is home, still weak, but is able to assist her with taking her blood sugars.  States she is still awaiting call from "someone at primary care office" to assist with getting blood sugar meter that reads out the numbers, unsure of who she spoke with in the office.  Patient attends [name] for primary care where there is an [name].  Message sent to [name] for possible transfer to Chronic Care Management for which patient is agreeable.  Plan:  RN Health Coach sent in basket message to [name] with the Chronic Care Management [name] for possible transition of patient to their services, will await response.   [name] [phone] [name]

## 2020-02-07 DIAGNOSIS — N2581 Secondary hyperparathyroidism of renal origin: Secondary | ICD-10-CM | POA: Diagnosis not present

## 2020-02-07 DIAGNOSIS — N184 Chronic kidney disease, stage 4 (severe): Secondary | ICD-10-CM | POA: Diagnosis not present

## 2020-02-07 DIAGNOSIS — M109 Gout, unspecified: Secondary | ICD-10-CM | POA: Diagnosis not present

## 2020-02-07 DIAGNOSIS — E1122 Type 2 diabetes mellitus with diabetic chronic kidney disease: Secondary | ICD-10-CM | POA: Diagnosis not present

## 2020-02-07 DIAGNOSIS — D631 Anemia in chronic kidney disease: Secondary | ICD-10-CM | POA: Diagnosis not present

## 2020-02-07 DIAGNOSIS — N183 Chronic kidney disease, stage 3 unspecified: Secondary | ICD-10-CM | POA: Diagnosis not present

## 2020-02-07 DIAGNOSIS — R809 Proteinuria, unspecified: Secondary | ICD-10-CM | POA: Diagnosis not present

## 2020-02-07 DIAGNOSIS — I129 Hypertensive chronic kidney disease with stage 1 through stage 4 chronic kidney disease, or unspecified chronic kidney disease: Secondary | ICD-10-CM | POA: Diagnosis not present

## 2020-02-07 NOTE — Patient Instructions (Signed)
Kasyn Rolph, BSN, RN-BC Embedded Chronic Care Manager [facility] Care Management Direct Dial: [phone]    

## 2020-02-07 NOTE — Chronic Care Management (AMB) (Addendum)
  Care Management   Note  02/06/2020 Name: [name] MRN: [id number] DOB: [birth year]  I was consulted by Hubert Azure, [place] regarding transitioning patient over from [facility] outreach to the embedded Chronic Care Management program at [facility]. She has refused this service in the past.   [name] has a history of CAD, HTN, DM, HLD, CKD, GAD, depression, osteoporosis, macular edema, diabetic retinopathy, retinal hemorrhage, and blindness.   [name] needs assistance obtaining a blood glucose meter with audio and help with transportation assistance. She sees Dr Dorris Fetch for diabetes management but I will talk with Lottie Dawson, PharmD with [facility] to see if she has a recommendation. I will reach out to [employer], LCSW regarding transportion assistance.   Follow up plan: The care management team will reach out to the patient again over the next 14 days.   [name], BSN, RN-BC Embedded Chronic Care Manager [facility] [place] Management Direct Dial: [phone]

## 2020-02-11 ENCOUNTER — Telehealth: Payer: Self-pay | Admitting: Family Medicine

## 2020-02-11 ENCOUNTER — Ambulatory Visit (INDEPENDENT_AMBULATORY_CARE_PROVIDER_SITE_OTHER): Payer: Medicare Other | Admitting: Licensed Clinical Social Worker

## 2020-02-11 DIAGNOSIS — N1832 Chronic kidney disease, stage 3b: Secondary | ICD-10-CM

## 2020-02-11 DIAGNOSIS — F411 Generalized anxiety disorder: Secondary | ICD-10-CM

## 2020-02-11 DIAGNOSIS — N189 Chronic kidney disease, unspecified: Secondary | ICD-10-CM | POA: Diagnosis not present

## 2020-02-11 DIAGNOSIS — E1159 Type 2 diabetes mellitus with other circulatory complications: Secondary | ICD-10-CM | POA: Diagnosis not present

## 2020-02-11 DIAGNOSIS — I10 Essential (primary) hypertension: Secondary | ICD-10-CM | POA: Diagnosis not present

## 2020-02-11 DIAGNOSIS — Z794 Long term (current) use of insulin: Secondary | ICD-10-CM

## 2020-02-11 DIAGNOSIS — E785 Hyperlipidemia, unspecified: Secondary | ICD-10-CM | POA: Diagnosis not present

## 2020-02-11 DIAGNOSIS — E1121 Type 2 diabetes mellitus with diabetic nephropathy: Secondary | ICD-10-CM

## 2020-02-11 DIAGNOSIS — E1169 Type 2 diabetes mellitus with other specified complication: Secondary | ICD-10-CM

## 2020-02-11 DIAGNOSIS — D631 Anemia in chronic kidney disease: Secondary | ICD-10-CM

## 2020-02-11 DIAGNOSIS — M810 Age-related osteoporosis without current pathological fracture: Secondary | ICD-10-CM

## 2020-02-11 DIAGNOSIS — H353132 Nonexudative age-related macular degeneration, bilateral, intermediate dry stage: Secondary | ICD-10-CM

## 2020-02-11 NOTE — Patient Instructions (Addendum)
Licensed Clinical Social Worker Visit Information  Goals we discussed today:  Goals Addressed              This Visit's Progress   .  Client will talk with LCSW in next 30 days about management of medical needs and daily needs of client (pt-stated)        CARE PLAN ENTRY   Current Barriers:  Marland Kitchen Macular Degeneration: both eyes . Management of Diabetes is challenging  Patient with chronic diagnoses of anemia, macular degeneration in both eyes, osteoporosis, CKD, Type 2 DM, Edema, HTN, HLD, Anxiety State, Depression  Clinical Social Work Clinical Goal(s):  Marland Kitchen LCSW will call client in next 30 days to talk with client about management of medical needs and daily needs of client  Interventions: . Talked with client about appetite of client . Talked with client about eye challenges of client . Talked with client about ADLs completion of client . Talked with client about transport needs of client . Talked with client about mobility of client (uses a cane to ambulate) . Talked with client about [facility] transport services . Talked with [place] about financial challenges of client . Talked with [name] about meal provision of client . Talked with [name] about medication procurement of client . Talked with Alejandra about pain issues (she said she has gout) . Talked with Edee about steps to access her home . Talked with [name] about [facility] Triage Nurse as a resource for client . Encouraged [name] to communicate with RNCM , [name], also as needed for CCM nursing support . Client and LCSW talked about her challenges in managing Diabetes  Collaborated with Dr. Lottie Dawson, pharmacist, regarding client request regarding meter for measuring Diabetes sugar level  Patient Self Care Activities:  Eats meals with set up assistance  Patient Self care Deficits:  Macular Degeneration: Both Eyes  Initial goal documentation       Materials Provided: No  Follow Up Plan: LCSW to call client in next 4  weeks to talk with her about managing medical needs of client and to talk with her about managing daily needs of client  The patient verbalized understanding of instructions provided today and declined a print copy of patient instruction materials.   [name].Greenleigh Kauth MSW, LCSW Licensed Clinical Social Worker [facility] Care Management [phone]

## 2020-02-11 NOTE — Chronic Care Management (AMB) (Signed)
Chronic Care Management    Clinical Social Work Follow Up Note  02/11/2020 Name: [name] MRN: [id number] DOB: [birth year]  [name] is a 74 y.o. year old female who is a primary care patient of [name] Norlander, DO. The CCM team was consulted for assistance with [name] .   Review of patient status, including review of consultants reports, other relevant assessments, and collaboration with appropriate care team members and the patient's provider was performed as part of comprehensive patient evaluation and provision of chronic care management services.    SDOH (Social Determinants of Health) assessments performed: Yes; risk for physical inactivity; risk for transport needs; risk for depression; risk for stress; risk for financial strain  SDOH Interventions     Most Recent Value  SDOH Interventions  Depression Interventions/Treatment  --  [LCSW talked with [name] about RNCM support, Triage Nurse support and about LCSW support]        Chronic Care Management from 02/11/2020 in [place]  PHQ-9 Total Score 5     GAD 7 : Generalized Anxiety Score 02/11/2020 01/30/2020  Nervous, Anxious, on Edge 1 1  Control/stop worrying 1 2  Worry too much - different things 1 2  Trouble relaxing 0 2  Restless 0 2  Easily annoyed or irritable 0 3  Afraid - awful might happen 0 1  Total GAD 7 Score 3 13  Anxiety Difficulty Somewhat difficult -    Outpatient Encounter Medications as of 02/11/2020  Medication Sig Note   Accu-Chek Softclix Lancets lancets Use as instructed 2 x daily  E11.65    amLODipine (NORVASC) 5 MG tablet Take 1 tablet (5 mg total) by mouth daily.    atorvastatin (LIPITOR) 10 MG tablet Take 1 tablet (10 mg total) by mouth daily.    blood glucose meter kit and supplies Dispense based on patient and insurance preference. Use up to four times daily as directed. (FOR ICD-10 E10.9, E11.9).    carvedilol (COREG) 6.25 MG tablet Take 1  tablet (6.25 mg total) by mouth 2 (two) times daily with a meal.    Cholecalciferol 25 MCG (1000 UT) capsule Take 1 capsule (1,000 Units total) by mouth daily.    colchicine 0.6 MG tablet Take 0.6 mg by mouth daily as needed.    fluticasone (FLONASE) 50 MCG/ACT nasal spray Use 2 sprays in each nostril daily 05/01/2019: Reports taking as needed   furosemide (LASIX) 40 MG tablet TAKE 1 TABLET TWICE WEEKLY    glipiZIDE (GLUCOTROL XL) 2.5 MG 24 hr tablet Take 1 tablet (2.5 mg total) by mouth daily with breakfast.    glucose blood test strip Use as instructed up to 4x daily. E11.65    Insulin Glargine (BASAGLAR KWIKPEN) 100 UNIT/ML SOPN Inject 0.1 mLs (10 Units total) into the skin daily.    ketorolac (ACULAR) 0.5 % ophthalmic solution Place 1 drop into the right eye 2 (two) times daily.    Lancets Misc. MISC Use as directed to check blood sugar up to 4x daily. E11.65    levothyroxine (SYNTHROID) 75 MCG tablet Take 1 tablet (75 mcg total) by mouth daily before breakfast.    pantoprazole (PROTONIX) 20 MG tablet Take 1 tablet (20 mg total) by mouth daily.    prednisoLONE acetate (PRED FORTE) 1 % ophthalmic suspension Place 1 drop into the right eye 4 (four) times daily.    ULTICARE MICRO PEN NEEDLES 32G X 4 MM MISC TWICE DAILY    No facility-administered  encounter medications on file as of 02/11/2020.    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, osteoporosis, CKD, Type 2 DM, Edema, HTN, HLD, Anxiety State, Depression  Clinical Social Work Clinical Goal(s):   LCSW will call client in next 30 days to talk with client about management of medical needs and daily  needs of client  Interventions:  Talked with client about appetite of client  Talked with client about eye challenges of client  Talked with client about ADLs completion of client  Talked with client about transport needs of client  Talked with client about mobility of client (uses a cane to ambulate)  Talked with client about [facility] transport services  Talked with [name] about financial challenges of client  Talked with [name] about meal provision of client  Talked with [name] about medication procurement of client  Talked with [name] about pain issues (she said she has gout)  Talked with [name] about steps to access her home  Talked with [name] about [facility] Triage Nurse as a resource for client  Encouraged [name] to communicate with RNCM , [name], also as needed for CCM nursing support  Client and LCSW talked about her challenges in managing Diabetes  Collaborated with Dr. Lottie Dawson, pharmacist, regarding client request regarding meter for measuring Diabetes sugar level  Patient Self Care Activities:  Eats meals with set up assistance  Patient Self care Deficits:  Macular Degeneration: Both Eyes  Initial goal documentation      Follow Up Plan: LCSW to call client in next 4 weeks to talk with her about managing medical needs of client and to talk with her about managing daily needs of client  [name].Linville Decarolis MSW, LCSW Licensed Clinical Social Worker [facility] Care Management [phone]

## 2020-02-12 ENCOUNTER — Ambulatory Visit: Payer: Self-pay | Admitting: Licensed Clinical Social Worker

## 2020-02-12 DIAGNOSIS — F411 Generalized anxiety disorder: Secondary | ICD-10-CM

## 2020-02-12 DIAGNOSIS — I152 Hypertension secondary to endocrine disorders: Secondary | ICD-10-CM

## 2020-02-12 DIAGNOSIS — E1122 Type 2 diabetes mellitus with diabetic chronic kidney disease: Secondary | ICD-10-CM

## 2020-02-12 DIAGNOSIS — H353132 Nonexudative age-related macular degeneration, bilateral, intermediate dry stage: Secondary | ICD-10-CM

## 2020-02-12 DIAGNOSIS — M810 Age-related osteoporosis without current pathological fracture: Secondary | ICD-10-CM

## 2020-02-12 DIAGNOSIS — E785 Hyperlipidemia, unspecified: Secondary | ICD-10-CM

## 2020-02-12 MED ORDER — PRODIGY VOICE BLOOD GLUCOSE W/DEVICE KIT: PACK | 0 refills | Status: DC

## 2020-02-12 MED ORDER — GLUCOSE BLOOD VI STRP: ORAL_STRIP | 12 refills | Status: DC

## 2020-02-12 NOTE — Chronic Care Management (AMB) (Signed)
Chronic Care Management    Clinical Social Work Follow Up Note  02/12/2020 Name: [name] MRN: [id number] DOB: 07-31-1945  [name] is a 74 y.o. year old female who is a primary care patient of [name], DO. The CCM team was consulted for assistance with [name] .   Review of patient status, including review of consultants reports, other relevant assessments, and collaboration with appropriate care team members and the patient's provider was performed as part of comprehensive patient evaluation and provision of chronic care management services.    SDOH (Social Determinants of Health) assessments performed: No;risk for depression; risk for financial strain; risk for physical inactivity; risk for transport needs    Chronic Care Management from 02/11/2020 in [place]  PHQ-9 Total Score 5       GAD 7 : Generalized Anxiety Score 02/11/2020 01/30/2020  Nervous, Anxious, on Edge 1 1  Control/stop worrying 1 2  Worry too much - different things 1 2  Trouble relaxing 0 2  Restless 0 2  Easily annoyed or irritable 0 3  Afraid - awful might happen 0 1  Total GAD 7 Score 3 13  Anxiety Difficulty Somewhat difficult -    Outpatient Encounter Medications as of 02/12/2020  Medication Sig Note  . Accu-Chek Softclix Lancets lancets Use as instructed 2 x daily  E11.65   . amLODipine (NORVASC) 5 MG tablet Take 1 tablet (5 mg total) by mouth daily.   Marland Kitchen atorvastatin (LIPITOR) 10 MG tablet Take 1 tablet (10 mg total) by mouth daily.   . blood glucose meter kit and supplies Dispense based on patient and insurance preference. Use up to four times daily as directed. (FOR ICD-10 E10.9, E11.9).   Marland Kitchen Blood Glucose Monitoring Suppl (PRODIGY VOICE BLOOD GLUCOSE) w/Device KIT Use to test blood sugar 3 times daily. DX: E11.9   . carvedilol (COREG) 6.25 MG tablet Take 1 tablet (6.25 mg total) by mouth 2 (two) times daily with a meal.   . Cholecalciferol 25  MCG (1000 UT) capsule Take 1 capsule (1,000 Units total) by mouth daily.   . colchicine 0.6 MG tablet Take 0.6 mg by mouth daily as needed.   . fluticasone (FLONASE) 50 MCG/ACT nasal spray Use 2 sprays in each nostril daily 05/01/2019: Reports taking as needed  . furosemide (LASIX) 40 MG tablet TAKE 1 TABLET TWICE WEEKLY   . glipiZIDE (GLUCOTROL XL) 2.5 MG 24 hr tablet Take 1 tablet (2.5 mg total) by mouth daily with breakfast.   . glucose blood test strip Use to test blood sugar 3 times daily. DX: E11.9   . Insulin Glargine (BASAGLAR KWIKPEN) 100 UNIT/ML SOPN Inject 0.1 mLs (10 Units total) into the skin daily.   Marland Kitchen ketorolac (ACULAR) 0.5 % ophthalmic solution Place 1 drop into the right eye 2 (two) times daily.   . Lancets Misc. MISC Use as directed to check blood sugar up to 4x daily. E11.65   . levothyroxine (SYNTHROID) 75 MCG tablet Take 1 tablet (75 mcg total) by mouth daily before breakfast.   . pantoprazole (PROTONIX) 20 MG tablet Take 1 tablet (20 mg total) by mouth daily.   . prednisoLONE acetate (PRED FORTE) 1 % ophthalmic suspension Place 1 drop into the right eye 4 (four) times daily.   Marland Kitchen ULTICARE MICRO PEN NEEDLES 32G X 4 MM MISC TWICE DAILY    No facility-administered encounter medications on file as of 02/12/2020.    Goals    .  Client will talk with LCSW in next 30 days about management of medical needs and daily needs of client (pt-stated)      CARE PLAN ENTRY   Current Barriers:  Marland Kitchen Macular Degeneration: both eyes . Management of Diabetes is challenging  Clinical Social Work Clinical Goal(s):  Marland Kitchen LCSW will call client in next 30 days to talk with client about management of medical needs and daily needs of client  Interventions: . LCSW collaborated with Dr. Lottie Dawson, pharmacist ,regarding client request for a meter to monitor her sugar levels . LCSW collaborated with LPN Jamelle Haring also regarding needs of client and client request regarding managing diabetets  Patient  Self Care Activities:  Eats meals with set up assistance  Patient Self care Deficits:  Macular Degeneration: Both Eyes   Initial goal documentation    Follow Up Plan: LCSW to call client in next 4 weeks to talk with her about managing medical needs of client and to talk with her about managing daily needs of client  [name].Keegen Heffern MSW, LCSW Licensed Clinical Social Worker [facility] Care Management [phone]

## 2020-02-12 NOTE — Patient Instructions (Addendum)
Licensed Clinical Social Worker Visit Information  Goals we discussed today:   .  Client will talk with LCSW in next 30 days about management of medical needs and daily needs of client (pt-stated)        CARE PLAN ENTRY   Current Barriers:   Macular Degeneration: both eyes  Management of Diabetes is challenging  Clinical Social Work Clinical Goal(s):   LCSW will call client in next 30 days to talk with client about management of medical needs and daily needs of client  Interventions:  LCSW collaborated with Dr. Lottie Dawson, pharmacist ,regarding client request for a meter to monitor her sugar levels  LCSW collaborated with LPN Jamelle Haring also regarding needs of client and client request regarding managing diabetets  Patient Self Care Activities:  Eats meals with set up assistance  Patient Self care Deficits:  Macular Degeneration: Both Eyes   Initial goal documentation    Follow Up Plan: LCSW to call client in next 4 weeks to talk with her about managing medical needs of client and to talk with her about managing daily needs of client  Materials Provided: No  LCSW was not able to speak via phone with client today; thus, the client was not able to verbalize understanding of instructions provided today and was not able to accept or decline a print copy of patient instruction materials.   [name].[name] MSW, LCSW Licensed Clinical Social Worker [facility] Care [facility] [phone]

## 2020-02-12 NOTE — Telephone Encounter (Signed)
[employer] VOICE glucometer called in the [facility]" for patient

## 2020-02-14 ENCOUNTER — Telehealth (INDEPENDENT_AMBULATORY_CARE_PROVIDER_SITE_OTHER): Payer: Medicare Other | Admitting: Nurse Practitioner

## 2020-02-14 ENCOUNTER — Other Ambulatory Visit: Payer: Self-pay | Admitting: Family Medicine

## 2020-02-14 ENCOUNTER — Encounter: Payer: Self-pay | Admitting: Nurse Practitioner

## 2020-02-14 VITALS — BP 146/66 | HR 63 | Ht 63.0 in | Wt 156.0 lb

## 2020-02-14 DIAGNOSIS — E1122 Type 2 diabetes mellitus with diabetic chronic kidney disease: Secondary | ICD-10-CM

## 2020-02-14 DIAGNOSIS — E782 Mixed hyperlipidemia: Secondary | ICD-10-CM | POA: Diagnosis not present

## 2020-02-14 DIAGNOSIS — Z794 Long term (current) use of insulin: Secondary | ICD-10-CM

## 2020-02-14 DIAGNOSIS — E039 Hypothyroidism, unspecified: Secondary | ICD-10-CM | POA: Diagnosis not present

## 2020-02-14 DIAGNOSIS — E1121 Type 2 diabetes mellitus with diabetic nephropathy: Secondary | ICD-10-CM

## 2020-02-14 DIAGNOSIS — N1832 Chronic kidney disease, stage 3b: Secondary | ICD-10-CM | POA: Diagnosis not present

## 2020-02-14 DIAGNOSIS — E034 Atrophy of thyroid (acquired): Secondary | ICD-10-CM

## 2020-02-14 DIAGNOSIS — I10 Essential (primary) hypertension: Secondary | ICD-10-CM

## 2020-02-14 MED ORDER — BASAGLAR KWIKPEN 100 UNIT/ML ~~LOC~~ SOPN: 8.0000 [IU] | PEN_INJECTOR | Freq: Every day | SUBCUTANEOUS | 3 refills | Status: DC

## 2020-02-14 NOTE — Progress Notes (Signed)
02/14/2020   Endocrinology follow-up note   TELEHEALTH VISIT: The patient is being engaged in telehealth visit due to COVID-19.  This type of visit limits physical examination significantly, and thus is not preferable over face-to-face encounters.  I connected with  [name] on 02/14/20 by a video enabled telemedicine application and verified that I am speaking with the correct person using two identifiers.   I discussed the limitations of evaluation and management by telemedicine. The patient expressed understanding and agreed to proceed.    The participants involved in this visit include: Brita Romp, NP located at [facility] and [name]  located at their personal residence listed.   Subjective:    Patient ID: [name], female    DOB: 07-30-1945.  She is being seen in follow-up in the management of uncontrolled type 2 diabetes, hypothyroidism, hypertension, hyperlipidemia.  Past Medical History:  Diagnosis Date  . Anal fistula   . Anemia in chronic renal disease    Aranesp injection --  when Hg <11, last injection 12-24-14.  Marland Kitchen Anxiety   . Arthritis    knees and hand/fingers. "broke back"-being evaluated for this"weakness left leg"  . Cataract    both eyes done  . 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 CKD (chronic kidney disease), stage III    nephrologist--  dr Mercy Moore-- LOV  07-09-2016  . Complication of anesthesia    post-op confusion   . Constipation   . Coronary artery disease   . Diabetic gastroparesis ([place])   . Diabetic retinopathy ([place])    bilateral --  monitored by dr [name]  . Diverticulosis of colon   . GAD (generalized anxiety disorder)   . GERD (gastroesophageal reflux disease)   . History of colon polyps    benign  . History of esophagitis   . History of GI bleed    upper 2009  due to esophagitis  &  2002  due to Mallory-Weiss tear  . History of hyperkalemia    pt had  previously been canceled twice dos due to elevated K+, 07-29-2016 last date cancelled -- pt visited pcp same day treated w/ kayexelate and K+ came down, pt brought all her medication's in to pcp office and found pt was taking losartan that had been discontinued due to ckd, pcp stated this was cause of elevated K+  . 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 History of rectal abscess    12-29-2004  bedside I & D  . Hyperlipidemia   . Hypertension   . Hypothyroidism   . LAFB (left anterior fascicular block)   . Right bundle branch block   . Sacral decubitus ulcer    since 2014- 01-02-15 remains with wound" gauze dressing changes daily.  . Type II diabetes mellitus ([place])    Past Surgical History:  Procedure Laterality Date  . APPLICATION OF A-CELL OF EXTREMITY N/A 04/07/2015   Procedure: A CELL PLACMENT;  Surgeon: Loel Lofty Dillingham, DO;  Location: [place];  Service: Plastics;  Laterality: N/A;  . CARDIOVASCULAR STRESS TEST  12-30-2004   normal perfusion study/  no ischemia or infartion/  normal LV wall function and wall motion , ef 66%  . CATARACT EXTRACTION W/ INTRAOCULAR LENS  IMPLANT, BILATERAL  1995  . COLONOSCOPY  2008   w/Dr.Brodie   . COLONOSCOPY W/ POLYPECTOMY  last one 2008  . COMPRESSION HIP SCREW Right 05/18/2014  Procedure: COMPRESSION HIP;  Surgeon: Carole Civil, MD;  Location: [facility];  Service: Orthopedics;  Laterality: Right;  . ESOPHAGOGASTRODUODENOSCOPY  last one 01-09-2011  . EVALUATION UNDER ANESTHESIA WITH FISTULECTOMY N/A 01/06/2015   Procedure: EXAM UNDER ANESTHESIA , placement of seton;  Surgeon: Jackolyn Confer, MD;  Location: [facility];  Service: General;  Laterality: N/A;  . I & D EXTREMITY N/A 04/07/2015   Procedure: IRRIGATION AND DEBRIDEMENT ISCHIAL ULCER;  Surgeon: Loel Lofty Dillingham, DO;  Location: [place];  Service: Plastics;  Laterality: N/A;  . INCISION AND DRAINAGE OF WOUND N/A 09/30/2014   Procedure: IRRIGATION AND DEBRIDEMENT SACRAL  WOUND, EXCISION OF PERIRECTAL TRACT WITH PLACEMENT OF ACCELL;  Surgeon: Theodoro Kos, DO;  Location: [place];  Service: Plastics;  Laterality: N/A;  . LIGATION OF INTERNAL FISTULA TRACT N/A 10/22/2016   Procedure: LIGATION OF INTERNAL FISTULA TRACT;  Surgeon: Leighton Ruff, MD;  Location: [facility];  Service: General;  Laterality: N/A;  . ORIF FEMUR FRACTURE Left 10/09/2012   Procedure: OPEN REDUCTION INTERNAL FIXATION (ORIF) DISTAL FEMUR FRACTURE;  Surgeon: Rozanna Box, MD;  Location: [place];  Service: Orthopedics;  Laterality: Left;  . RETINOPATHY SURGERY Bilateral 1980's?  . TRANSTHORACIC ECHOCARDIOGRAM  02-18-2011   mild LVH,  ef 55-60%,  grade I diastolic dysfunction/  mild TR/  RV systolic pressure increased consistant with moderate pulmonary hypertension   Social History   Socioeconomic History  . Marital status: Married    Spouse name: [name]  . Number of children: 0  . Years of education: 63  . Highest education level: Not on file  Occupational History  . Occupation: Retired    Fish farm manager: RETIRED  Tobacco Use  . Smoking status: Never Smoker  . Smokeless tobacco: Never Used  Vaping Use  . Vaping Use: Never used  Substance and Sexual Activity  . Alcohol use: No  . Drug use: No  . Sexual activity: Not Currently  Other Topics Concern  . Not on file  Social History Narrative   Lives with husband.    Caffeine use: none   Social Determinants of Health   Financial Resource Strain: Low Risk   . Difficulty of Paying Living Expenses: Not hard at all  Food Insecurity: No Food Insecurity  . Worried About Charity fundraiser in the Last Year: Never true  . Ran Out of Food in the Last Year: Never true  Transportation Needs: Unmet Transportation Needs  . Lack of Transportation (Medical): Yes  . Lack of Transportation (Non-Medical): No  Physical Activity: Inactive  . Days of Exercise per Week: 0 days  . Minutes of Exercise per Session: 0 min   Stress:   . Feeling of Stress : Not on file  Social Connections:   . Frequency of Communication with Friends and Family: Not on file  . Frequency of Social Gatherings with Friends and Family: Not on file  . Attends Religious Services: Not on file  . Active Member of Clubs or Organizations: Not on file  . Attends Archivist Meetings: Not on file  . Marital Status: Not on file   Outpatient Encounter Medications as of 02/14/2020  Medication Sig  . Accu-Chek Softclix Lancets lancets Use as instructed 2 x daily  E11.65  . amLODipine (NORVASC) 5 MG tablet Take 1 tablet (5 mg total) by mouth daily.  Marland Kitchen atorvastatin (LIPITOR) 10 MG tablet Take 1 tablet (10 mg total) by mouth daily.  . blood glucose meter kit  and supplies Dispense based on patient and insurance preference. Use up to four times daily as directed. (FOR ICD-10 E10.9, E11.9).  Marland Kitchen Blood Glucose Monitoring Suppl (PRODIGY VOICE BLOOD GLUCOSE) w/Device KIT Use to test blood sugar 3 times daily. DX: E11.9  . carvedilol (COREG) 6.25 MG tablet Take 1 tablet (6.25 mg total) by mouth 2 (two) times daily with a meal.  . Cholecalciferol 25 MCG (1000 UT) capsule Take 1 capsule (1,000 Units total) by mouth daily.  . colchicine 0.6 MG tablet Take 0.6 mg by mouth daily as needed.  . fluticasone (FLONASE) 50 MCG/ACT nasal spray Use 2 sprays in each nostril daily  . furosemide (LASIX) 40 MG tablet TAKE 1 TABLET TWICE WEEKLY  . glucose blood test strip Use to test blood sugar 3 times daily. DX: E11.9  . Insulin Glargine (BASAGLAR KWIKPEN) 100 UNIT/ML Inject 8 Units into the skin daily.  Marland Kitchen ketorolac (ACULAR) 0.5 % ophthalmic solution Place 1 drop into the right eye 2 (two) times daily.  . Lancets Misc. MISC Use as directed to check blood sugar up to 4x daily. E11.65  . levothyroxine (SYNTHROID) 75 MCG tablet Take 1 tablet (75 mcg total) by mouth daily before breakfast.  . ULTICARE MICRO PEN NEEDLES 32G X 4 MM MISC TWICE DAILY  . [DISCONTINUED]  glipiZIDE (GLUCOTROL XL) 2.5 MG 24 hr tablet Take 1 tablet (2.5 mg total) by mouth daily with breakfast.  . [DISCONTINUED] Insulin Glargine (BASAGLAR KWIKPEN) 100 UNIT/ML SOPN Inject 0.1 mLs (10 Units total) into the skin daily. (Patient taking differently: Inject 8 Units into the skin daily. )  . pantoprazole (PROTONIX) 20 MG tablet Take 1 tablet (20 mg total) by mouth daily. (Patient not taking: Reported on 02/14/2020)  . prednisoLONE acetate (PRED FORTE) 1 % ophthalmic suspension Place 1 drop into the right eye 4 (four) times daily. (Patient not taking: Reported on 02/14/2020)   No facility-administered encounter medications on file as of 02/14/2020.   ALLERGIES: Allergies  Allergen Reactions  . Aspirin Nausea And Vomiting  . Ciprofloxacin Other (See Comments)    Upset Stomach  . Codeine Nausea And Vomiting    Makes me sick   . Losartan     Hyperkalemia   . Micardis [Telmisartan] Other (See Comments)    unknown  . Nexium [Esomeprazole Magnesium] Other (See Comments)    Causes internal bleeding  . Niaspan [Niacin Er] Other (See Comments)    Reaction is unknown  . Onglyza [Saxagliptin] Other (See Comments)    Reaction is unknown  . Other     No otc pain medications  . Rofecoxib Other (See Comments)    unknown  . Simvastatin   . Tequin [Gatifloxacin] Other (See Comments)    Reaction is unknown  . Welchol [Colesevelam Hcl]   . Amoxicillin Nausea And Vomiting and Rash    Has patient had a PCN reaction causing immediate rash, facial/tongue/throat swelling, SOB or lightheadedness with hypotension: Yes Has patient had a PCN reaction causing severe rash involving mucus membranes or skin necrosis: no  Has patient had a PCN reaction that required hospitalization No Has patient had a PCN reaction occurring within the last 10 years: No If all of the above answers are "NO", then may proceed with Cephalosporin use.    VACCINATION STATUS: Immunization History  Administered Date(s)  Administered  . Fluad Quad(high Dose 65+) 02/05/2019  . Influenza, High Dose Seasonal PF 03/05/2016, 03/14/2017, 03/01/2018  . Influenza,inj,Quad PF,6+ Mos 02/16/2013, 02/27/2015  . PFIZER SARS-COV-2 Vaccination  06/13/2019, 07/04/2019  . Pneumococcal Conjugate-13 10/11/2014  . Pneumococcal Polysaccharide-23 10/10/2012  . Tdap 10/06/2012    Diabetes She presents for her follow-up diabetic visit. She has type 2 diabetes mellitus. Onset time: She was diagnosed at approximate age of 41 years . Her disease course has been improving. There are no hypoglycemic associated symptoms. Pertinent negatives for hypoglycemia include no confusion, headaches, nervousness/anxiousness, pallor, seizures or tremors. Pertinent negatives for diabetes include no chest pain, no fatigue, no polydipsia, no polyphagia and no visual change. There are no hypoglycemic complications. Symptoms are improving. Diabetic complications include heart disease, nephropathy, peripheral neuropathy, PVD and retinopathy. Risk factors for coronary artery disease include diabetes mellitus, dyslipidemia, hypertension, sedentary lifestyle, obesity and post-menopausal. Current diabetic treatment includes oral agent (monotherapy) and insulin injections. She is compliant with treatment most of the time. Her weight is increasing steadily. She is following a generally unhealthy diet. When asked about meal planning, she reported none. She has not had a previous visit with a dietitian. She never participates in exercise. Her home blood glucose trend is decreasing steadily. Her breakfast blood glucose range is generally 90-110 mg/dl. Her overall blood glucose range is 140-180 mg/dl. (She presents for her virtual visit today with her meter and logs showing at target fasting and postprandial glycemic profile.  Her previsit A1C was 7.5%, overall improving from last visit of 8.1%.  She only takes her Glipizide occasionally when her glucose readings are high as it  has a tendency to bottom out her blood sugar.  She denies any major episodes of hypoglycemia.) An ACE inhibitor/angiotensin II receptor blocker is being taken. She does not see a podiatrist.Eye exam is current.  Thyroid Problem Presents for follow-up visit. Onset time: 15 years. Symptoms include weight gain. Patient reports no anxiety, cold intolerance, constipation, diarrhea, fatigue, heat intolerance, palpitations, tremors or visual change. The symptoms have been stable. Past treatments include levothyroxine. The following procedures have not been performed: thyroidectomy.  Hypertension This is a chronic problem. The current episode started more than 1 year ago. The problem is unchanged. The problem is controlled. Pertinent negatives include no chest pain, headaches, palpitations or shortness of breath. Agents associated with hypertension include thyroid hormones. Risk factors for coronary artery disease include diabetes mellitus, dyslipidemia, obesity, post-menopausal state and sedentary lifestyle. Past treatments include calcium channel blockers, diuretics and beta blockers. The current treatment provides mild improvement. There are no compliance problems.  Hypertensive end-organ damage includes CAD/MI, PVD and retinopathy. Identifiable causes of hypertension include chronic renal disease and a thyroid problem.    Review of systems  Constitutional: + Minimally fluctuating body weight,  current Body mass index is 27.63 kg/m. , no fatigue, no subjective hyperthermia, no subjective hypothermia Eyes: no blurry vision, no xerophthalmia ENT: no sore throat, no nodules palpated in throat, no dysphagia/odynophagia, no hoarseness Cardiovascular: no chest pain, no shortness of breath, no palpitations, no leg swelling Respiratory: no cough, no shortness of breath Gastrointestinal: no nausea/vomiting/diarrhea Musculoskeletal: no muscle/joint aches Skin: no rashes, no hyperemia Neurological: no tremors, no  numbness, no tingling, no dizziness Psychiatric: no depression, no anxiety  Objective:    BP (!) 146/66   Pulse 63   Ht _0  (1.6 m)   Wt 156 lb (70.8 kg)   BMI 27.63 kg/m   Wt Readings from Last 3 Encounters:  02/14/20 156 lb (70.8 kg)  01/30/20 152 lb (68.9 kg)  10/09/19 153 lb 9.6 oz (69.7 kg)    BP Readings from Last 3 Encounters:  02/14/20 (!) 146/66  01/30/20 (!) 146/66  10/09/19 (!) 184/64   Physical Exam- Telehealth- significantly limited due to nature of visit  Constitutional: Body mass index is 27.63 kg/m. , not in acute distress, normal state of mind Respiratory: Adequate breathing efforts   Results for orders placed or performed in visit on 01/30/20  Bayer DCA Hb A1c Waived  Result Value Ref Range   HB A1C (BAYER DCA - WAIVED) 7.5 (H) <7.0 %  Thyroid Panel With TSH  Result Value Ref Range   TSH 0.988 0.450 - 4.500 uIU/mL   T4, Total 8.4 4.5 - 12.0 ug/dL   T3 Uptake Ratio 32 24 - 39 %   Free Thyroxine Index 2.7 1.2 - 4.9   Diabetic Labs (most recent): Lab Results  Component Value Date   HGBA1C 7.5 (H) 01/30/2020   HGBA1C 8.1 (H) 09/17/2019   HGBA1C 7.7 (H) 04/03/2019   Lipid Panel     Component Value Date/Time   CHOL 113 09/17/2019 0955   CHOL 105 09/11/2012 1057   TRIG 84 09/17/2019 0955   TRIG 170 (H) 10/11/2014 1159   TRIG 79 09/11/2012 1057   HDL 45 09/17/2019 0955   HDL 53 10/11/2014 1159   HDL 41 09/11/2012 1057   CHOLHDL 2.5 09/17/2019 0955   LDLCALC 51 09/17/2019 0955   LDLCALC 50 02/14/2014 1012   LDLCALC 48 09/11/2012 1057     Assessment & Plan:   1. Type 2 diabetes mellitus with stage 4 chronic kidney disease, without long-term current use of insulin ([place])  - Patient has currently controlled asymptomatic type 2 DM since  74 years of age.  -She presents for her virtual visit today with her meter and logs showing at target fasting and postprandial glycemic profile.  Her previsit A1C was 7.5%, overall improving from last visit  of 8.1%.  She only takes her Glipizide occasionally when her glucose readings are high as it has a tendency to bottom out her blood sugar.  She denies any major episodes of hypoglycemia.   -her  diabetes is complicated by PAD, CKD, neuropathy and patient remains at a high risk for more acute and chronic complications of diabetes which include CAD, CVA, CKD, retinopathy, and neuropathy. These are all discussed in detail with the patient.  - I have counseled the patient on diet management  by adopting a carbohydrate restricted/protein rich diet.  - The patient admits there is a room for improvement in their diet and drink choices. -  Suggestion is made for the patient to avoid simple carbohydrates from their diet including Cakes, Sweet Desserts / Pastries, Ice Cream, Soda (diet and regular), Sweet Tea, Candies, Chips, Cookies, Sweet Pastries,  Store Bought Juices, Alcohol in Excess of  1-2 drinks a day, Artificial Sweeteners, Coffee Creamer, and "Sugar-free" Products. This will help patient to have stable blood glucose profile and potentially avoid unintended weight gain.   - I encouraged the patient to switch to  unprocessed or minimally processed complex starch and increased protein intake (animal or plant source), fruits, and vegetables.   - Patient is advised to stick to a routine mealtimes to eat 3 meals  a day and avoid unnecessary snacks ( to snack only to correct hypoglycemia).  - I have approached patient with the following individualized plan to manage diabetes and patient agrees:    -Her husband, Ludwig Clarks, continues to offer to help. -She is tolerating her current dose of Basaglar and is advised to continue 8 units SQ daily.  -  She is encouraged to continue monitoring blood glucose at least twice per day, before breakfast and before bed and call the clinic if readings are less than 70 or greater than 200 for 3 tests in a row. -She has not been taking her low-dose Glipizide regularly due to  its tendency to bottom out her glucose, therefore will discontinue for now.  -Patient is not a candidate for MTF, Incretin therapy.  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    - Patient specific target  A1c;  LDL, HDL, Triglycerides,  were discussed in detail.  2) BP/HTN:  Her blood pressure is controlled to target based on her age and previous visit readings.  She does monitor her BP at home.  She is advised to continue Norvasc 5 mg po daily, Coreg 6.25 mg po twice daily, and Lasix 40 mg twice weekly.  3) Lipids/HPL:  Her most recent lipid panel from 09/17/19 shows controlled LDL of 51.  She is advised to continue Lipitor 10 mg po daily at bedtime.  Side effects and precautions discussed with her.  4)  Weight/Diet: Her Body mass index is 27.63 kg/m.--may benefit from some minimal weight loss.  CDE Consult has been initiated, she has limited ability to exercise.  5)  Hypothyroidism:  -Her previst thyroid function tests are consistent with appropriate replacement.  She is advised to continue Levothyroxine 75 mch po daily before breakfast.    - We discussed about the correct intake of her thyroid hormone, on empty stomach at fasting, with water, separated by at least 30 minutes from breakfast and other medications,  and separated by more than 4 hours from calcium, iron, multivitamins, acid reflux medications (PPIs). -Patient is made aware of the fact that thyroid hormone replacement is needed for life, dose to be adjusted by periodic monitoring of thyroid function tests.  5) Chronic Care/Health Maintenance: -Patient is on Statin medications and encouraged to continue to follow up with Ophthalmology, Podiatrist at least yearly or according to recommendations, and advised to   stay away from smoking. I have recommended yearly flu vaccine and pneumonia vaccination at least every 5 years;  and  sleep for at least 7 hours a day.  - I advised patient to maintain close follow up with  [name], DO for primary care needs.   I spent 30 minutes dedicated to the care of this patient on the date of this encounter to include pre-visit review of records, face-to-face time with the patient, and post visit ordering of  testing.   Please refer to Patient Instructions for Blood Glucose Monitoring and Insulin/Medications Dosing Guide"  in media tab for additional information. Please  also refer to " Patient Self Inventory" in the Media  tab for reviewed elements of pertinent patient history.  [name] participated in the discussions, expressed understanding, and voiced agreement with the above plans.  All questions were answered to her satisfaction. she is encouraged to contact clinic should she have any questions or concerns prior to her return visit.    Follow up plan: - Return in about 4 months (around 06/15/2020) for Diabetes follow up, Thyroid follow up, Previsit labs, Virtual visit ok.  Rayetta Pigg, [facility] [address] [place], [place] [address] Phone: [phone] Fax: [phone]  02/14/2020, 9:58 AM

## 2020-02-14 NOTE — Patient Instructions (Signed)

## 2020-02-26 ENCOUNTER — Ambulatory Visit (INDEPENDENT_AMBULATORY_CARE_PROVIDER_SITE_OTHER): Payer: Medicare Other | Admitting: Licensed Clinical Social Worker

## 2020-02-26 DIAGNOSIS — M810 Age-related osteoporosis without current pathological fracture: Secondary | ICD-10-CM | POA: Diagnosis not present

## 2020-02-26 DIAGNOSIS — N1832 Chronic kidney disease, stage 3b: Secondary | ICD-10-CM | POA: Diagnosis not present

## 2020-02-26 DIAGNOSIS — E1159 Type 2 diabetes mellitus with other circulatory complications: Secondary | ICD-10-CM

## 2020-02-26 DIAGNOSIS — E1169 Type 2 diabetes mellitus with other specified complication: Secondary | ICD-10-CM | POA: Diagnosis not present

## 2020-02-26 DIAGNOSIS — E1122 Type 2 diabetes mellitus with diabetic chronic kidney disease: Secondary | ICD-10-CM

## 2020-02-26 DIAGNOSIS — E785 Hyperlipidemia, unspecified: Secondary | ICD-10-CM | POA: Diagnosis not present

## 2020-02-26 DIAGNOSIS — F411 Generalized anxiety disorder: Secondary | ICD-10-CM

## 2020-02-26 DIAGNOSIS — I152 Hypertension secondary to endocrine disorders: Secondary | ICD-10-CM

## 2020-02-26 DIAGNOSIS — Z794 Long term (current) use of insulin: Secondary | ICD-10-CM | POA: Diagnosis not present

## 2020-02-26 NOTE — Chronic Care Management (AMB) (Signed)
°Chronic Care Management  ° ° Clinical Social Work Follow Up Note ° °02/26/2020 °Name: [name] MRN: [id number] DOB: 05/28/1945 ° °[name] is a 74 y.o. year old female who is a primary care patient of Gottschalk, Ashly M, DO. The CCM team was consulted for assistance with [employer] .  ° °Review of patient status, including review of consultants reports, other relevant assessments, and collaboration with appropriate care team members and the patient's provider was performed as part of comprehensive patient evaluation and provision of chronic care management services.   ° °SDOH (Social Determinants of Health) assessments performed: No;risk for depression; risk for tobacco use; risk for financial strain; risk for physical inactivity; risk for transport needs ° °  Chronic Care Management from 02/11/2020 in [facility]  °PHQ-9 Total Score 5  °  ° °  °GAD 7 : Generalized Anxiety Score 02/11/2020 01/30/2020  °Nervous, Anxious, on Edge 1 1  °Control/stop worrying 1 2  °Worry too much - different things 1 2  °Trouble relaxing 0 2  °Restless 0 2  °Easily annoyed or irritable 0 3  °Afraid - awful might happen 0 1  °Total GAD 7 Score 3 13  °Anxiety Difficulty Somewhat difficult -  ° ° ° °Outpatient Encounter Medications as of 02/26/2020  °Medication Sig Note  °• Accu-Chek Softclix Lancets lancets Use as instructed 2 x daily  E11.65   °• amLODipine (NORVASC) 5 MG tablet Take 1 tablet (5 mg total) by mouth daily.   °• atorvastatin (LIPITOR) 10 MG tablet Take 1 tablet (10 mg total) by mouth daily.   °• blood glucose meter kit and supplies Dispense based on patient and insurance preference. Use up to four times daily as directed. (FOR ICD-10 E10.9, E11.9).   °• Blood Glucose Monitoring Suppl (PRODIGY VOICE BLOOD GLUCOSE) w/Device KIT Use to test blood sugar 3 times daily. DX: E11.9   °• carvedilol (COREG) 6.25 MG tablet Take 1 tablet (6.25 mg total) by mouth 2 (two) times daily with a meal.    °• Cholecalciferol 25 MCG (1000 UT) capsule Take 1 capsule (1,000 Units total) by mouth daily.   °• colchicine 0.6 MG tablet Take 0.6 mg by mouth daily as needed.   °• fluticasone (FLONASE) 50 MCG/ACT nasal spray Use 2 sprays in each nostril daily 05/01/2019: Reports taking as needed  °• furosemide (LASIX) 40 MG tablet TAKE 1 TABLET TWICE WEEKLY   °• glucose blood test strip Use to test blood sugar 3 times daily. DX: E11.9   °• Insulin Glargine (BASAGLAR KWIKPEN) 100 UNIT/ML Inject 8 Units into the skin daily.   °• ketorolac (ACULAR) 0.5 % ophthalmic solution Place 1 drop into the right eye 2 (two) times daily.   °• Lancets Misc. MISC Use as directed to check blood sugar up to 4x daily. E11.65   °• levothyroxine (SYNTHROID) 75 MCG tablet TAKE 1 TABLET DAILY BEFORE BREAKFAST   °• pantoprazole (PROTONIX) 20 MG tablet Take 1 tablet (20 mg total) by mouth daily. (Patient not taking: Reported on 02/14/2020)   °• prednisoLONE acetate (PRED FORTE) 1 % ophthalmic suspension Place 1 drop into the right eye 4 (four) times daily. (Patient not taking: Reported on 02/14/2020)   °• ULTICARE MICRO PEN NEEDLES 32G X 4 MM MISC TWICE DAILY   ° °No facility-administered encounter medications on file as of 02/26/2020.  ° °  °Goals Addressed   °  °  °  °  °  ° This Visit's   Progress  ° •  Client will talk with LCSW in next 30 days about management of medical needs and daily needs of client (pt-stated)     °   CARE PLAN ENTRY ° ° °Current Barriers:  °• Macular Degeneration: both eyes °• Management of Diabetes is challenging °• Patient with chronic diagnoses of CKD, Osteoporosis, Edema, HTN, Depression, HLD, Type 2 DM, Anxiety State ° °Clinical Social Work Clinical Goal(s):  °• LCSW will call client in next 30 days to talk with client about management of medical needs and daily needs of client ° °Interventions: °• Talked with client about appetite of client °• Talked with client about eye challenges of client °• Talked with client about ADLs  completion of client °• Talked with client about transport needs of client °• Talked with client about mobility of client (uses a cane to ambulate) °• Talked with client about [facility] transport services °• Talked with Khalila about financial challenges of client °• Talked with Teddy about meal provision of client °• Talked with Cathy about medication procurement of client °• Talked with Desire about pain issues (she said she has gout) °• Talked with Tamira about steps to access her home °• Talked with Stephen about [facility] Triage Nurse as a resource for client °• Encouraged Vivyan to communicate with RNCM , Kristen Hudy, also as needed for CCM nursing support °• Client and LCSW talked about her challenges in managing Diabetes °• Talked with Jirah about heath needs of her spouse °• Talked with client about her communicating with Dr. Julie Pruitt regarding CGM for client ° ° °Patient Self Care Activities:  °Eats meals with set up assistance ° °Patient Self care Deficits: ° °Macular Degeneration: Both Eyes ° ° °Initial goal documentation  °  °  °  °Follow Up Plan: LCSW to call client in next 4 weeks to talk with her about managing medical needs of client and to talk with her about managing daily needs of client ° ° S. MSW, LCSW °Licensed Clinical Social Worker °[facility] °[phone] °

## 2020-02-26 NOTE — Patient Instructions (Addendum)
Licensed Clinical Education officer, museum Visit Information  Goals we discussed today:  Goals Addressed              This Visit's Progress     Client will talk with LCSW in next 30 days about management of medical needs and daily needs of client (pt-stated)        CARE PLAN ENTRY   Current Barriers:   Macular Degeneration: both eyes  Management of Diabetes is challenging  Patient with chronic diagnoses of CKD, Osteoporosis, Edema, HTN, Depression, HLD, Type 2 DM, Anxiety State  Clinical Social Work Clinical Goal(s):   LCSW will call client in next 30 days to talk with client about management of medical needs and daily needs of client  Interventions:  Talked with client about appetite of client  Talked with client about eye challenges of client  Talked with client about ADLs completion of client  Talked with client about transport needs of client  Talked with client about mobility of client (uses a cane to ambulate)  Talked with client about [facility] transport services  Talked with Lilian about financial challenges of client  Talked with Janille about meal provision of client  Talked with [name] about medication procurement of client  Talked with Belanna about pain issues (she said she has gout)  Talked with [name] about steps to access her home  Talked with [name] about [facility] Triage Nurse as a resource for client  Encouraged Jalayiah to communicate with RNCM , [name], also as needed for CCM nursing support  Client and LCSW talked about her challenges in managing Diabetes  Talked with [name] about heath needs of her spouse  Talked with client about her communicating with Dr. Lottie Dawson regarding CGM for client  Patient Self Care Activities:  Eats meals with set up assistance  Patient Self care Deficits:  Macular Degeneration: Both Eyes   Initial goal documentation        Materials Provided: No  Follow Up Plan: LCSW to call client in next 4 weeks to talk with  her about managing medical needs of client and to talk with her about managing daily needs of client  The patient verbalized understanding of instructions provided today and declined a print copy of patient instruction materials.   [name].Braylinn Gulden MSW, LCSW Licensed Clinical Social Worker [facility] [phone]

## 2020-02-29 ENCOUNTER — Ambulatory Visit: Payer: Medicare Other

## 2020-03-04 ENCOUNTER — Telehealth: Payer: Self-pay

## 2020-03-04 NOTE — Telephone Encounter (Signed)
No 30 day wait is needed.  Can have at the same time.  Boosters are available for Pfizer only to my knowledge at this time.  Levan Hurst has yet to release a booster.  From the CDC: Yes. If a patient is eligible, both influenza and COVID-19 vaccines can be administered at the same visit, without regard to timing as recommended by CDC and its Advisory Committee on Immunizations Practices (ACIP). If a patient is due for both vaccines, providers are encouraged to offer both vaccines at the same visit. Coadministration of all recommended vaccines is important because it increases the probability that people will be fully vaccinated. It is also an important part of immunization practice if a health care provider is uncertain that a patient will return for additional doses of vaccine.

## 2020-03-04 NOTE — Telephone Encounter (Signed)
Please advise - I have not heard of this before.

## 2020-03-04 NOTE — Telephone Encounter (Signed)
Patient aware.

## 2020-03-05 ENCOUNTER — Other Ambulatory Visit: Payer: Self-pay

## 2020-03-05 ENCOUNTER — Ambulatory Visit (INDEPENDENT_AMBULATORY_CARE_PROVIDER_SITE_OTHER): Payer: Medicare Other

## 2020-03-05 DIAGNOSIS — Z23 Encounter for immunization: Secondary | ICD-10-CM

## 2020-03-05 NOTE — Progress Notes (Signed)
   Covid-19 Vaccination Clinic  Name:  [name]    MRN: [id number] DOB: Nov 12, 1945  03/05/2020  [name] was observed post Covid-19 immunization for 15 minutes without incident. She was provided with Vaccine Information Sheet and instruction to access the V-Safe system.   [name] was instructed to call 911 with any severe reactions post vaccine: Marland Kitchen Difficulty breathing  . Swelling of face and throat  . A fast heartbeat  . A bad rash all over body  . Dizziness and weakness

## 2020-03-07 ENCOUNTER — Encounter: Payer: Self-pay | Admitting: "Endocrinology

## 2020-03-07 DIAGNOSIS — N183 Chronic kidney disease, stage 3 unspecified: Secondary | ICD-10-CM | POA: Diagnosis not present

## 2020-03-07 DIAGNOSIS — R809 Proteinuria, unspecified: Secondary | ICD-10-CM | POA: Diagnosis not present

## 2020-03-07 DIAGNOSIS — N2581 Secondary hyperparathyroidism of renal origin: Secondary | ICD-10-CM | POA: Diagnosis not present

## 2020-03-07 DIAGNOSIS — N184 Chronic kidney disease, stage 4 (severe): Secondary | ICD-10-CM | POA: Diagnosis not present

## 2020-03-07 DIAGNOSIS — M109 Gout, unspecified: Secondary | ICD-10-CM | POA: Diagnosis not present

## 2020-03-07 DIAGNOSIS — D631 Anemia in chronic kidney disease: Secondary | ICD-10-CM | POA: Diagnosis not present

## 2020-03-07 DIAGNOSIS — I129 Hypertensive chronic kidney disease with stage 1 through stage 4 chronic kidney disease, or unspecified chronic kidney disease: Secondary | ICD-10-CM | POA: Diagnosis not present

## 2020-03-07 DIAGNOSIS — E1122 Type 2 diabetes mellitus with diabetic chronic kidney disease: Secondary | ICD-10-CM | POA: Diagnosis not present

## 2020-03-13 DIAGNOSIS — Z961 Presence of intraocular lens: Secondary | ICD-10-CM | POA: Diagnosis not present

## 2020-04-02 ENCOUNTER — Telehealth: Payer: Medicare Other | Admitting: *Deleted

## 2020-04-02 ENCOUNTER — Ambulatory Visit: Payer: Medicare Other | Admitting: Licensed Clinical Social Worker

## 2020-04-02 DIAGNOSIS — M810 Age-related osteoporosis without current pathological fracture: Secondary | ICD-10-CM

## 2020-04-02 DIAGNOSIS — E1159 Type 2 diabetes mellitus with other circulatory complications: Secondary | ICD-10-CM

## 2020-04-02 DIAGNOSIS — E1169 Type 2 diabetes mellitus with other specified complication: Secondary | ICD-10-CM

## 2020-04-02 DIAGNOSIS — F411 Generalized anxiety disorder: Secondary | ICD-10-CM

## 2020-04-02 NOTE — Patient Instructions (Addendum)
Licensed Clinical Social Worker Visit Information  Goals we discussed today:    .  Client will talk with LCSW in next 30 days about management of medical needs and daily needs of client (pt-stated)       CARE PLAN ENTRY   Current Barriers:   Macular Degeneration: both eyes  Management of Diabetes is challenging  Patient with chronic diagnoses of CKD, Osteoporosis, Edema, HTN, Depression, HLD, Type 2 DM, Anxiety State  Clinical Social Work Clinical Goal(s):   LCSW will call client in next 30 days to talk with client about management of medical needs and daily needs of client  Interventions:  LCSW communicated with representative of [facility] to determine resources for client through that agency in [place], [place]  Talked with client about eye challenges of client (she has obtained new glasses) Talked with client about ADLs completion of client Talked with client about transport needs of client Talked with client about Medicaid application process  Talked with client about financial needs of client Talked with client about mobility of client (uses a cane to ambulate) Talked with client about [facility] transport services Talked with [name] about meal provision of client Talked with [name] about medication procurement of client Talked with Aniza about pain issues (she said she has gout) Encouraged [name] to communicate with RNCM , [name], also as needed for CCM nursing support Client and LCSW talked about her challenges in managing Diabetes Talked with client about health needs of her spouse Talked with client about support for client with [facility] pharmacist, Dr. Lottie Dawson Talked with client about her upcoming medical appointments Talked with [name] about LCSW call today to research client resources through [facility] Talked with [name] about family support for client Collaborated with [facility] regarding nursing needs of client  Patient Self  Care Activities:  Eats meals with set up assistance  Patient Self care Deficits:  Macular Degeneration: Both Eyes   Initial goal documentation     Follow Up Plan: LCSW to call client in next 4 weeks to talk with her about managing medical needs of client and to talk with her about managing daily needs of client  Materials Provided: No  The patient verbalized understanding of instructions provided today and declined a print copy of patient instruction materials.   [name].Kaiulani Sitton MSW, LCSW Licensed Clinical Social Worker [facility] [phone]

## 2020-04-02 NOTE — Chronic Care Management (AMB) (Signed)
Chronic Care Management    Clinical Social Work Follow Up Note  04/02/2020 Name: [name] MRN: [id number] DOB: [birth year]  [name] is a 74 y.o. year old female who is a primary care patient of [name], DO. The CCM team was consulted for assistance with [name] .   Review of patient status, including review of consultants reports, other relevant assessments, and collaboration with appropriate care team members and the patient's provider was performed as part of comprehensive patient evaluation and provision of chronic care management services.    SDOH (Social Determinants of Health) assessments performed: No;risk for depression; risk for financial strain; risk for stress; risk for physical inactivity; risk for transport needs    Chronic Care Management from 02/11/2020 in [facility]  PHQ-9 Total Score 5       GAD 7 : Generalized Anxiety Score 02/11/2020 01/30/2020  Nervous, Anxious, on Edge 1 1  Control/stop worrying 1 2  Worry too much - different things 1 2  Trouble relaxing 0 2  Restless 0 2  Easily annoyed or irritable 0 3  Afraid - awful might happen 0 1  Total GAD 7 Score 3 13  Anxiety Difficulty Somewhat difficult -    Outpatient Encounter Medications as of 04/02/2020  Medication Sig Note  . Accu-Chek Softclix Lancets lancets Use as instructed 2 x daily  E11.65   . amLODipine (NORVASC) 5 MG tablet Take 1 tablet (5 mg total) by mouth daily.   Marland Kitchen atorvastatin (LIPITOR) 10 MG tablet Take 1 tablet (10 mg total) by mouth daily.   . blood glucose meter kit and supplies Dispense based on patient and insurance preference. Use up to four times daily as directed. (FOR ICD-10 E10.9, E11.9).   Marland Kitchen Blood Glucose Monitoring Suppl (PRODIGY VOICE BLOOD GLUCOSE) w/Device KIT Use to test blood sugar 3 times daily. DX: E11.9   . carvedilol (COREG) 6.25 MG tablet Take 1 tablet (6.25 mg total) by mouth 2 (two) times daily with a meal.   .  Cholecalciferol 25 MCG (1000 UT) capsule Take 1 capsule (1,000 Units total) by mouth daily.   . colchicine 0.6 MG tablet Take 0.6 mg by mouth daily as needed.   . fluticasone (FLONASE) 50 MCG/ACT nasal spray Use 2 sprays in each nostril daily 05/01/2019: Reports taking as needed  . furosemide (LASIX) 40 MG tablet TAKE 1 TABLET TWICE WEEKLY   . glucose blood test strip Use to test blood sugar 3 times daily. DX: E11.9   . Insulin Glargine (BASAGLAR KWIKPEN) 100 UNIT/ML Inject 8 Units into the skin daily.   Marland Kitchen ketorolac (ACULAR) 0.5 % ophthalmic solution Place 1 drop into the right eye 2 (two) times daily.   . Lancets Misc. MISC Use as directed to check blood sugar up to 4x daily. E11.65   . levothyroxine (SYNTHROID) 75 MCG tablet TAKE 1 TABLET DAILY BEFORE BREAKFAST   . pantoprazole (PROTONIX) 20 MG tablet Take 1 tablet (20 mg total) by mouth daily. (Patient not taking: Reported on 02/14/2020)   . prednisoLONE acetate (PRED FORTE) 1 % ophthalmic suspension Place 1 drop into the right eye 4 (four) times daily. (Patient not taking: Reported on 02/14/2020)   . ULTICARE MICRO PEN NEEDLES 32G X 4 MM MISC TWICE DAILY    No facility-administered encounter medications on file as of 04/02/2020.    Goals    .  Client will talk with LCSW in next 30 days about management of medical needs  and daily needs of client (pt-stated)      CARE PLAN ENTRY   Current Barriers:  Marland Kitchen Macular Degeneration: both eyes . Management of Diabetes is challenging . Patient with chronic diagnoses of CKD, Osteoporosis, Edema, HTN, Depression, HLD, Type 2 DM, Anxiety State  Clinical Social Work Clinical Goal(s):  Marland Kitchen LCSW will call client in next 30 days to talk with client about management of medical needs and daily needs of client  Interventions:  LCSW communicated with representative of [facility] to determine resources for client through that agency in [place], [place]  Talked with client about eye challenges  of client (she has obtained new glasses) Talked with client about ADLs completion of client Talked with client about transport needs of client Talked with client about Medicaid application process  Talked with client about financial needs of client Talked with client about mobility of client (uses a cane to ambulate) Talked with client about [facility] transport services Talked with [name] about meal provision of client Talked with [name] about medication procurement of client Talked with Journi about pain issues (she said she has gout) Encouraged [name] to communicate with RNCM , [name], also as needed for CCM nursing support Client and LCSW talked about her challenges in managing Diabetes Talked with client about health needs of her spouse Talked with client about support for client with [facility] pharmacist, Dr. Lottie Dawson Talked with client about her upcoming medical appointments Talked with Sakira about LCSW call today to research client resources through [facility] Talked with [name] about family support for client Collaborated with [facility] regarding nursing needs of client  Patient Self Care Activities:  Eats meals with set up assistance  Patient Self care Deficits:  Macular Degeneration: Both Eyes   Initial goal documentation     Follow Up Plan:  LCSW to call client in next 4 weeks to talk with her about managing medical needs of client and to talk with her about managing daily needs of client  [name].Elijah Michaelis MSW, LCSW Licensed Clinical Social Worker [facility] [phone]

## 2020-04-08 ENCOUNTER — Other Ambulatory Visit: Payer: Self-pay | Admitting: "Endocrinology

## 2020-04-08 ENCOUNTER — Other Ambulatory Visit: Payer: Self-pay | Admitting: Family Medicine

## 2020-04-08 NOTE — Telephone Encounter (Signed)
  Prescription Request  04/08/2020  What is the name of the medication or equipment? Diabetic test strips  Have you contacted your pharmacy to request a refill? (if applicable) yes  Which pharmacy would you like this sent to? [facility], please call pt to let her know once refill has been sent to pharmacy   Patient notified that their request is being sent to the clinical staff for review and that they should receive a response within 2 business days.

## 2020-04-09 ENCOUNTER — Ambulatory Visit: Payer: Medicare Other | Admitting: Pharmacist

## 2020-04-09 DIAGNOSIS — N1832 Chronic kidney disease, stage 3b: Secondary | ICD-10-CM

## 2020-04-09 DIAGNOSIS — Z794 Long term (current) use of insulin: Secondary | ICD-10-CM

## 2020-04-09 NOTE — Patient Instructions (Signed)
Visit Information  Goals Addressed              This Visit's Progress     Patient Stated   .  I would like to check my blood sugar (pt-stated)        CARE PLAN ENTRY (see longitudinal plan of care for additional care plan information)  Current Barriers:  . Social, financial, community barriers:  . Diabetes: K2VJ; complicated by chronic medical conditions including HTN, CKD, HLD, most recent A1c 7.9% . Most recent eGFR: 33 . Current antihyperglycemic regimen: BASAGLAR 8 UNITS daily . Reports hypoglycemic symptoms (reports at nighttime)--dizziness, lightheadedness, shaking, sweating . Denies hyperglycemic symptoms . Current exercise: n/a . Current blood glucose readings: FBG 104, post-FBG 200s . Cardiovascular risk reduction: o Current hypertensive regimen:amlodipine, carvedilol, not on ACE/ARB o Current hyperlipidemia regimen: atorvastatin o Current antiplatelet regimen: n/a  Pharmacist Clinical Goal(s):  Marland Kitchen Over the next 90 days, patient will work with PharmD and primary care provider to address needs related to diabetes management  Interventions: . Comprehensive medication review performed, medication list updated in electronic medical record . Inter-disciplinary care team collaboration (see longitudinal plan of care)--discussed case with CCM SW Scott . Highly encouraged patient to call endocrine with concerns of hypoglycemia and issues with checking blood sugar due to vision impairment .   Patient Self Care Activities:  . Patient will check blood glucose twice daily, document, and provide at future appointments . Patient will focus on medication adherence by continuing to take medications as prescribed . Patient will take medications as prescribed . Patient will contact provider with any episodes of hypoglycemia . Patient will report any questions or concerns to provider   Initial goal documentation        The patient verbalized understanding of instructions,  educational materials, and care plan provided today and agreed to receive a mailed copy of patient instructions, educational materials, and care plan.   Telephone follow up appointment with care management team member scheduled for: 40 days  SIGNATURE  Regina Eck, PharmD, BCPS Clinical Pharmacist, [place]  [facility] Phone [phone]

## 2020-04-09 NOTE — Progress Notes (Signed)
Chronic Care Management   Follow Up Note   04/09/2020 Name: [name] MRN: [id number] DOB: [birth year]  Referred by: [name], DO Reason for referral : Diabetes    [name] is a 74 y.o. year old female who is a primary care patient of [name], DO. The CCM team was consulted for assistance with chronic disease management and care coordination needs.    Review of patient status, including review of consultants reports, relevant laboratory and other test results, and collaboration with appropriate care team members and the patient's provider was performed as part of comprehensive patient evaluation and provision of chronic care management services.    SDOH (Social Determinants of Health) assessments performed: No See Care Plan activities for detailed interventions related to [facility])     Outpatient Encounter Medications as of 04/09/2020  Medication Sig Note  . ACCU-CHEK AVIVA PLUS test strip CHECK BLOOD SUGAR UP TO 4 TIMES A DAY   . Accu-Chek Softclix Lancets lancets CHECK BLOOD SUGAR 2 TIMES A DAY   . amLODipine (NORVASC) 5 MG tablet Take 1 tablet (5 mg total) by mouth daily.   Marland Kitchen atorvastatin (LIPITOR) 10 MG tablet Take 1 tablet (10 mg total) by mouth daily.   . blood glucose meter kit and supplies Dispense based on patient and insurance preference. Use up to four times daily as directed. (FOR ICD-10 E10.9, E11.9).   . carvedilol (COREG) 6.25 MG tablet Take 1 tablet (6.25 mg total) by mouth 2 (two) times daily with a meal.   . Cholecalciferol 25 MCG (1000 UT) capsule Take 1 capsule (1,000 Units total) by mouth daily.   . colchicine 0.6 MG tablet Take 0.6 mg by mouth daily as needed.   . fluticasone (FLONASE) 50 MCG/ACT nasal spray Use 2 sprays in each nostril daily 05/01/2019: Reports taking as needed  . furosemide (LASIX) 40 MG tablet TAKE 1 TABLET TWICE WEEKLY   . Insulin Glargine (BASAGLAR KWIKPEN) 100 UNIT/ML Inject 8 Units into the skin daily.   Marland Kitchen  ketorolac (ACULAR) 0.5 % ophthalmic solution Place 1 drop into the right eye 2 (two) times daily.   . Lancets Misc. MISC Use as directed to check blood sugar up to 4x daily. E11.65   . levothyroxine (SYNTHROID) 75 MCG tablet TAKE 1 TABLET DAILY BEFORE BREAKFAST   . pantoprazole (PROTONIX) 20 MG tablet Take 1 tablet (20 mg total) by mouth daily. (Patient not taking: Reported on 02/14/2020)   . prednisoLONE acetate (PRED FORTE) 1 % ophthalmic suspension Place 1 drop into the right eye 4 (four) times daily. (Patient not taking: Reported on 02/14/2020)   . ULTICARE MICRO PEN NEEDLES 32G X 4 MM MISC TWICE DAILY   . [DISCONTINUED] Blood Glucose Monitoring Suppl (PRODIGY VOICE BLOOD GLUCOSE) w/Device KIT Use to test blood sugar 3 times daily. DX: E11.9    No facility-administered encounter medications on file as of 04/09/2020.     Objective:   Goals Addressed              This Visit's Progress     Patient Stated   .  I would like to check my blood sugar (pt-stated)        CARE PLAN ENTRY (see longitudinal plan of care for additional care plan information)  Current Barriers:  . Social, financial, community barriers:  . Diabetes: Q8GN; complicated by chronic medical conditions including HTN, CKD, HLD, most recent A1c 7.9% . Most recent eGFR: 33 . Current antihyperglycemic regimen: BASAGLAR  8 UNITS daily . Reports hypoglycemic symptoms (reports at nighttime)--dizziness, lightheadedness, shaking, sweating . Denies hyperglycemic symptoms . Current exercise: n/a . Current blood glucose readings: FBG 104, post-FBG 200s . Cardiovascular risk reduction: o Current hypertensive regimen:amlodipine, carvedilol, not on ACE/ARB o Current hyperlipidemia regimen: atorvastatin o Current antiplatelet regimen: n/a  Pharmacist Clinical Goal(s):  Marland Kitchen Over the next 90 days, patient will work with PharmD and primary care provider to address needs related to diabetes  management  Interventions: . Comprehensive medication review performed, medication list updated in electronic medical record . Inter-disciplinary care team collaboration (see longitudinal plan of care)--discussed case with CCM SW Scott . Highly encouraged patient to call endocrine with concerns of hypoglycemia and issues with checking blood sugar due to vision impairment .   Patient Self Care Activities:  . Patient will check blood glucose twice daily, document, and provide at future appointments . Patient will focus on medication adherence by continuing to take medications as prescribed . Patient will take medications as prescribed . Patient will contact provider with any episodes of hypoglycemia . Patient will report any questions or concerns to provider   Initial goal documentation         Plan:   Telephone follow up appointment with CCM PharmD scheduled for:   SIGNATURE  Regina Eck, PharmD, BCPS Clinical Pharmacist, [place]  II Phone [phone]

## 2020-04-10 ENCOUNTER — Other Ambulatory Visit: Payer: Self-pay | Admitting: *Deleted

## 2020-04-10 MED ORDER — CHOLECALCIFEROL 25 MCG (1000 UT) PO CAPS: 1000.0000 [IU] | ORAL_CAPSULE | Freq: Every day | ORAL | 3 refills | Status: DC

## 2020-04-15 DIAGNOSIS — M79676 Pain in unspecified toe(s): Secondary | ICD-10-CM | POA: Diagnosis not present

## 2020-04-15 DIAGNOSIS — L84 Corns and callosities: Secondary | ICD-10-CM | POA: Diagnosis not present

## 2020-04-15 DIAGNOSIS — E1151 Type 2 diabetes mellitus with diabetic peripheral angiopathy without gangrene: Secondary | ICD-10-CM | POA: Diagnosis not present

## 2020-04-15 DIAGNOSIS — B351 Tinea unguium: Secondary | ICD-10-CM | POA: Diagnosis not present

## 2020-04-16 ENCOUNTER — Encounter (INDEPENDENT_AMBULATORY_CARE_PROVIDER_SITE_OTHER): Payer: Medicare Other | Admitting: Ophthalmology

## 2020-04-21 ENCOUNTER — Other Ambulatory Visit: Payer: Self-pay

## 2020-04-21 ENCOUNTER — Ambulatory Visit (INDEPENDENT_AMBULATORY_CARE_PROVIDER_SITE_OTHER): Payer: Medicare Other | Admitting: Ophthalmology

## 2020-04-21 ENCOUNTER — Encounter (INDEPENDENT_AMBULATORY_CARE_PROVIDER_SITE_OTHER): Payer: Self-pay | Admitting: Ophthalmology

## 2020-04-21 DIAGNOSIS — H353134 Nonexudative age-related macular degeneration, bilateral, advanced atrophic with subfoveal involvement: Secondary | ICD-10-CM | POA: Diagnosis not present

## 2020-04-21 DIAGNOSIS — E113553 Type 2 diabetes mellitus with stable proliferative diabetic retinopathy, bilateral: Secondary | ICD-10-CM | POA: Diagnosis not present

## 2020-04-21 DIAGNOSIS — H35351 Cystoid macular degeneration, right eye: Secondary | ICD-10-CM | POA: Diagnosis not present

## 2020-04-21 DIAGNOSIS — H2701 Aphakia, right eye: Secondary | ICD-10-CM

## 2020-04-21 DIAGNOSIS — Z794 Long term (current) use of insulin: Secondary | ICD-10-CM | POA: Diagnosis not present

## 2020-04-21 MED ORDER — KETOROLAC TROMETHAMINE 0.5 % OP SOLN: 1.0000 [drp] | Freq: Every morning | OPHTHALMIC | 12 refills | Status: AC

## 2020-04-21 NOTE — Assessment & Plan Note (Signed)
Minor perifoveal CME, much improved on twice daily dosing topical NSAIDs, will use topical NSAID OD chronically once daily

## 2020-04-21 NOTE — Progress Notes (Signed)
04/21/2020     CHIEF COMPLAINT Patient presents for Retina Follow Up   HISTORY OF PRESENT ILLNESS: [name] is a 74 y.o. female who presents to the clinic today for:   HPI    Retina Follow Up    Patient presents with  Other.  In right eye.  This started 3 months ago.  Severity is mild.  Duration of 3 months.  Since onset it is stable.          Comments    3 Month F/U OD  Pt sts new glasses seem to help [facility] OU. Pt denies new symptoms since last visit.       Last edited by Rockie Neighbours, [place] on 04/21/2020  8:36 AM. (History)      Referring physician: [name], DO [place],  [place] [address]  HISTORICAL INFORMATION:   Selected notes from the MEDICAL RECORD NUMBER    Lab Results  Component Value Date   HGBA1C 7.5 (H) 01/30/2020     CURRENT MEDICATIONS: Current Outpatient Medications (Ophthalmic Drugs)  Medication Sig  . ketorolac (ACULAR) 0.5 % ophthalmic solution Place 1 drop into the right eye 2 (two) times daily.  . prednisoLONE acetate (PRED FORTE) 1 % ophthalmic suspension Place 1 drop into the right eye 4 (four) times daily. (Patient not taking: Reported on 02/14/2020)   No current facility-administered medications for this visit. (Ophthalmic Drugs)   Current Outpatient Medications (Other)  Medication Sig  . ACCU-CHEK AVIVA PLUS test strip CHECK BLOOD SUGAR UP TO 4 TIMES A DAY  . Accu-Chek Softclix Lancets lancets CHECK BLOOD SUGAR 2 TIMES A DAY  . amLODipine (NORVASC) 5 MG tablet Take 1 tablet (5 mg total) by mouth daily.  Marland Kitchen atorvastatin (LIPITOR) 10 MG tablet Take 1 tablet (10 mg total) by mouth daily.  . blood glucose meter kit and supplies Dispense based on patient and insurance preference. Use up to four times daily as directed. (FOR ICD-10 E10.9, E11.9).  . carvedilol (COREG) 6.25 MG tablet Take 1 tablet (6.25 mg total) by mouth 2 (two) times daily with a meal.  . Cholecalciferol 25 MCG (1000 UT) capsule Take 1 capsule  (1,000 Units total) by mouth daily.  . colchicine 0.6 MG tablet Take 0.6 mg by mouth daily as needed.  . fluticasone (FLONASE) 50 MCG/ACT nasal spray Use 2 sprays in each nostril daily  . furosemide (LASIX) 40 MG tablet TAKE 1 TABLET TWICE WEEKLY  . Insulin Glargine (BASAGLAR KWIKPEN) 100 UNIT/ML Inject 8 Units into the skin daily.  . Lancets Misc. MISC Use as directed to check blood sugar up to 4x daily. E11.65  . levothyroxine (SYNTHROID) 75 MCG tablet TAKE 1 TABLET DAILY BEFORE BREAKFAST  . pantoprazole (PROTONIX) 20 MG tablet Take 1 tablet (20 mg total) by mouth daily. (Patient not taking: Reported on 02/14/2020)  . ULTICARE MICRO PEN NEEDLES 32G X 4 MM MISC TWICE DAILY   No current facility-administered medications for this visit. (Other)      REVIEW OF SYSTEMS:    ALLERGIES Allergies  Allergen Reactions  . Aspirin Nausea And Vomiting  . Ciprofloxacin Other (See Comments)    Upset Stomach  . Codeine Nausea And Vomiting    Makes me sick   . Losartan     Hyperkalemia   . Micardis [Telmisartan] Other (See Comments)    unknown  . Nexium [Esomeprazole Magnesium] Other (See Comments)    Causes internal bleeding  . Niaspan [Niacin Er]  Other (See Comments)    Reaction is unknown  . Onglyza [Saxagliptin] Other (See Comments)    Reaction is unknown  . Other     No otc pain medications  . Rofecoxib Other (See Comments)    unknown  . Simvastatin   . Tequin [Gatifloxacin] Other (See Comments)    Reaction is unknown  . Welchol [Colesevelam Hcl]   . Amoxicillin Nausea And Vomiting and Rash    Has patient had a PCN reaction causing immediate rash, facial/tongue/throat swelling, SOB or lightheadedness with hypotension: Yes Has patient had a PCN reaction causing severe rash involving mucus membranes or skin necrosis: no  Has patient had a PCN reaction that required hospitalization No Has patient had a PCN reaction occurring within the last 10 years: No If all of the above  answers are "NO", then may proceed with Cephalosporin use.     PAST MEDICAL HISTORY Past Medical History:  Diagnosis Date  . Anal fistula   . Anemia in chronic renal disease    Aranesp injection --  when Hg <11, last injection 12-24-14.  Marland Kitchen Anxiety   . Aphakia of eye, right 10/02/2019   Condition resolved, status post vitrectomy, removal IOL, insertion Yamani scleral tunnel Ze[facility] CT Lucio lens June 2021  . Arthritis    knees and hand/fingers. "broke back"-being evaluated for this"weakness left leg"  . Cataract    both eyes done  . 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 CKD (chronic kidney disease), stage [facility])    nephrologist--  dr Mercy Moore-- [place]  07-09-2016  . Complication of anesthesia    post-op confusion   . Constipation   . Coronary artery disease   . Diabetic gastroparesis ([place])   . Diabetic retinopathy ([place])    bilateral --  monitored by dr [name]  . Diverticulosis of colon   . GAD (generalized anxiety disorder)   . GERD (gastroesophageal reflux disease)   . History of colon polyps    benign  . History of esophagitis   . History of GI bleed    upper 2009  due to esophagitis  &  2002  due to Mallory-Weiss tear  . History of hyperkalemia    pt had previously been canceled twice dos due to elevated K+, 07-29-2016 last date cancelled -- pt visited pcp same day treated w/ kayexelate and K+ came down, pt brought all her medication's in to pcp office and found pt was taking losartan that had been discontinued due to ckd, pcp stated this was cause of elevated K+  . 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 History of rectal abscess    12-29-2004  bedside I & D  . Hyperlipidemia   . Hypertension   . Hypothyroidism   . LAFB (left anterior fascicular block)   . Right bundle branch block   . Sacral decubitus ulcer    since 2014- 01-02-15 remains with wound" gauze dressing changes daily.  . Type II diabetes mellitus ([place])     Past Surgical History:  Procedure Laterality Date  . APPLICATION OF A-CELL OF EXTREMITY N/A 04/07/2015   Procedure: A CELL PLACMENT;  Surgeon: Loel Lofty Dillingham, DO;  Location: [facility];  Service: Plastics;  Laterality: N/A;  . CARDIOVASCULAR STRESS TEST  12-30-2004   normal perfusion study/  no ischemia or infartion/  normal LV wall function and wall motion , ef 66%  . CATARACT EXTRACTION W/ INTRAOCULAR LENS  IMPLANT, BILATERAL  1995  . COLONOSCOPY  2008   w/Dr.Brodie   . COLONOSCOPY W/ POLYPECTOMY  last one 2008  . COMPRESSION HIP SCREW Right 05/18/2014   Procedure: COMPRESSION HIP;  Surgeon: Carole Civil, MD;  Location: [facility];  Service: Orthopedics;  Laterality: Right;  . ESOPHAGOGASTRODUODENOSCOPY  last one 01-09-2011  . EVALUATION UNDER ANESTHESIA WITH FISTULECTOMY N/A 01/06/2015   Procedure: EXAM UNDER ANESTHESIA , placement of seton;  Surgeon: Jackolyn Confer, MD;  Location: [facility];  Service: General;  Laterality: N/A;  . I & D EXTREMITY N/A 04/07/2015   Procedure: IRRIGATION AND DEBRIDEMENT ISCHIAL ULCER;  Surgeon: Loel Lofty Dillingham, DO;  Location: [facility];  Service: Plastics;  Laterality: N/A;  . INCISION AND DRAINAGE OF WOUND N/A 09/30/2014   Procedure: IRRIGATION AND DEBRIDEMENT SACRAL WOUND, EXCISION OF PERIRECTAL TRACT WITH PLACEMENT OF ACCELL;  Surgeon: Theodoro Kos, DO;  Location: [facility];  Service: Plastics;  Laterality: N/A;  . LIGATION OF INTERNAL FISTULA TRACT N/A 10/22/2016   Procedure: LIGATION OF INTERNAL FISTULA TRACT;  Surgeon: Leighton Ruff, MD;  Location[facility];  Service: General;  Laterality: N/A;  . ORIF FEMUR FRACTURE Left 10/09/2012   Procedure: OPEN REDUCTION INTERNAL FIXATION (ORIF) DISTAL FEMUR FRACTURE;  Surgeon: Rozanna Box, MD;  Location: [facility];  Service: Orthopedics;  Laterality: Left;  . RETINOPATHY SURGERY Bilateral 1980's?  . TRANSTHORACIC ECHOCARDIOGRAM  02-18-2011   mild LVH,  ef 55-60%,  grade I  diastolic dysfunction/  mild TR/  RV systolic pressure increased consistant with moderate pulmonary hypertension    FAMILY HISTORY Family History  Problem Relation Age of Onset  . Colon cancer Father 64  . Prostate cancer Father   . Diabetes Father   . Coronary artery disease Mother 48  . Heart disease Mother   . Diabetes Mother   . Coronary artery disease Sister 22  . Diabetes Sister   . Heart disease Sister   . Diabetes Sister   . Diabetes Sister   . Diabetes Sister   . Diabetes Sister   . Diabetes Sister   . Diabetes Maternal Grandmother   . Esophageal cancer Neg Hx   . Stomach cancer Neg Hx   . Rectal cancer Neg Hx     SOCIAL HISTORY Social History   Tobacco Use  . Smoking status: Never Smoker  . Smokeless tobacco: Never Used  Vaping Use  . Vaping Use: Never used  Substance Use Topics  . Alcohol use: No  . Drug use: No         OPHTHALMIC EXAM: Base Eye Exam    Visual Acuity (ETDRS)      Right Left   Dist cc 20/200 20/200   Dist ph cc NI NI   Correction: Glasses       Tonometry (Tonopen, 8:37 AM)      Right Left   Pressure 15 15       Pupils      Dark Light Shape React APD   Right 3 3 Round Minimal None   Left 2 2 Round Minimal None       Visual Fields (Counting fingers)      Left Right   Restrictions Partial outer superior temporal, inferior temporal, superior nasal, inferior nasal deficiencies Partial outer superior temporal, inferior temporal, superior nasal, inferior nasal deficiencies       Extraocular Movement      Right Left    Full Full  Neuro/Psych    Oriented x3: Yes   Mood/Affect: Normal       Dilation    Right eye: 1.0% Mydriacyl, 2.5% Phenylephrine @ 8:40 AM        Slit Lamp and Fundus Exam    External Exam      Right Left   External Normal Normal       Slit Lamp Exam      Right Left   Lids/Lashes Normal Normal   Conjunctiva/Sclera White and quiet, no exposed haptic bulbs. White and quiet, no exposed  haptic bulbs.   Cornea Clear Clear   Anterior Chamber Deep and quiet Deep and quiet   Iris Round and reactive, PI inferonasal patent Round and reactive   Lens Centered posterior chamber intraocular lens,, scleral tunnel haptic fixation, haptic tips well covered, no erosion , no iris chafe Centered posterior chamber intraocular lens   Anterior Vitreous Normal Normal       Fundus Exam      Right Left   Posterior Vitreous Clear, vitrectomized Clear, vitrectomized   Disc Normal Normal   C/D Ratio 0.5 0.5   Macula Geographic atrophy, good focal Geographic atrophy, good focal   Vessels PDR quiescent PDR quiescent   Periphery PRP 360.  attached, lesion creep over decades,  enlarging laser  scars PRP 360.  attached, lesion creep over decades,  enlarging laser  scars          IMAGING AND PROCEDURES  Imaging and Procedures for 04/21/20  OCT, Retina - OU - Both Eyes       Right Eye Quality was good. Scan locations included subfoveal. Central Foveal Thickness: 219. Findings include cystoid macular edema, abnormal foveal contour.   Left Eye Quality was good. Scan locations included subfoveal. Central Foveal Thickness: 189. Progression has been stable. Findings include abnormal foveal contour.   Notes Minor perifoveal CME on the nasal aspect of the foveal avascular zone we will treat topically with NSAIDs. Currently on twice daily dosing OD, will continue chronic therapy once daily                ASSESSMENT/PLAN:  Cystoid macular edema, right eye Minor perifoveal CME, much improved on twice daily dosing topical NSAIDs, will use topical NSAID OD chronically once daily  Controlled type 2 diabetes mellitus with stable proliferative retinopathy of both eyes, with long-term current use of insulin (HCC) The nature of regressed proliferative diabetic retinopathy was discussed with the patient. The patient was advised to maintain good glucose, blood pressure, monitor kidney function and  serum lipid control as advised by personal physician. Rare risk for reactivation of progression exist with untreated severe anemia, untreated renal failure, untreated heart failure, and smoking. Complete avoidance of smoking was recommended. The chance of recurrent proliferative diabetic retinopathy was discussed as well as the chance of vitreous hemorrhage for which further treatments may be necessary.   Explained to the patient that the quiescent  proliferative diabetic retinopathy disease is unlikely to ever worsen.  Worsening factors would include however severe anemia, hypertension out-of-control or impending renal failure.  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 The results of the AREDS 2 study was discussed with the patient. A diet rich in dark leafy green vegetables was advised and specific recommendations were made regarding supplements with AREDS 2 formulation . Control of hypertension and serum cholesterol  may slow the disease. Smoking cessation is mandatory to slow the disease and diminish the risk of progressing to wet age related macular degeneration. The patient was instructed in the use of an [place] and was told to return immediately for any changes in the Grid. Stressed to the patient do not rub eyes      ICD-10-CM   1. Cystoid macular edema, right eye  H35.351 OCT, Retina - OU - Both Eyes  2. Aphakia of eye, right  H27.01   3. Controlled type 2 diabetes mellitus with stable proliferative retinopathy of both eyes, with long-term current use of insulin ([place])  Z61.0960    Z79.4   4. 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 OD, minor perifoveal CME. Improved on topical NSAIDs twice daily, will use chronically once daily OD to prevent  recurrence of CME with the placement of posterior chamber scleral tunnel intraocular lens June 2021 OD  2.  3.  Ophthalmic Meds Ordered this visit:  No orders of the defined types were placed in this encounter.      Return in about 9 months (around 01/19/2021) for DILATE OU, OCT.  There are no Patient Instructions on file for this visit.   Explained the diagnoses, plan, and follow up with the patient and they expressed understanding.  Patient expressed understanding of the importance of proper follow up care.   Clent Demark Makeisha Jentsch M.D. Diseases & Surgery of the Retina and Vitreous Retina & Diabetic [place] 04/21/20     Abbreviations: M myopia (nearsighted); A astigmatism; H hyperopia (farsighted); P presbyopia; Mrx spectacle prescription;  CTL contact lenses; OD right eye; OS left eye; OU both eyes  XT exotropia; ET esotropia; PEK punctate epithelial keratitis; PEE punctate epithelial erosions; DES dry eye syndrome; MGD meibomian gland dysfunction; ATs artificial tears; PFAT's preservative free artificial tears; [place] nuclear sclerotic cataract; PSC posterior subcapsular cataract; ERM epi-retinal membrane; PVD posterior vitreous detachment; RD retinal detachment; DM diabetes mellitus; DR diabetic retinopathy; NPDR non-proliferative diabetic retinopathy; PDR proliferative diabetic retinopathy; CSME clinically significant macular edema; DME diabetic macular edema; dbh dot blot hemorrhages; CWS cotton wool spot; POAG primary open angle glaucoma; C/D cup-to-disc ratio; HVF humphrey visual field; GVF goldmann visual field; OCT optical coherence tomography; IOP intraocular pressure; BRVO Branch retinal vein occlusion; CRVO central retinal vein occlusion; CRAO central retinal artery occlusion; BRAO branch retinal artery occlusion; RT retinal tear; SB scleral buckle; PPV pars plana vitrectomy; VH Vitreous hemorrhage; PRP panretinal laser photocoagulation; IVK intravitreal kenalog; VMT vitreomacular  traction; MH Macular hole;  NVD neovascularization of the disc; NVE neovascularization elsewhere; AREDS age related eye disease study; ARMD age related macular degeneration; POAG primary open angle glaucoma; EBMD epithelial/anterior basement membrane dystrophy; ACIOL anterior chamber intraocular lens; IOL intraocular lens; PCIOL posterior chamber intraocular lens; Phaco/IOL phacoemulsification with intraocular lens placement; [place] photorefractive keratectomy; LASIK laser assisted in situ keratomileusis; HTN hypertension; DM diabetes mellitus; COPD chronic obstructive pulmonary disease

## 2020-04-21 NOTE — Assessment & Plan Note (Signed)

## 2020-04-21 NOTE — Assessment & Plan Note (Signed)

## 2020-04-21 NOTE — Patient Instructions (Signed)
Instructed to contact the office promptly for new onset visual acuity difficulties or declines

## 2020-04-23 ENCOUNTER — Ambulatory Visit: Payer: Medicare Other | Admitting: Pharmacist

## 2020-04-23 DIAGNOSIS — E1122 Type 2 diabetes mellitus with diabetic chronic kidney disease: Secondary | ICD-10-CM

## 2020-04-23 DIAGNOSIS — N1832 Chronic kidney disease, stage 3b: Secondary | ICD-10-CM

## 2020-04-23 NOTE — Progress Notes (Signed)
Chronic Care Management   Follow Up Note   04/23/2020 Name: Tanaisha H Brightbill MRN: [id number] DOB: 09/03/1945  Referred by: Gottschalk, Ashly M, DO Reason for referral : CCM-Diabetes   Marieanne H Gossett is a 74 y.o. year old female who is a primary care patient of Gottschalk, Ashly M, DO. The CCM team was consulted for assistance with chronic disease management and care coordination needs.    Review of patient status, including review of consultants reports, relevant laboratory and other test results, and collaboration with appropriate care team members and the patient's provider was performed as part of comprehensive patient evaluation and provision of chronic care management services.    SDOH (Social Determinants of Health) assessments performed: No See Care Plan activities for detailed interventions related to SDOH)     Outpatient Encounter Medications as of 04/23/2020  Medication Sig Note  . ACCU-CHEK AVIVA PLUS test strip CHECK BLOOD SUGAR UP TO 4 TIMES A DAY   . Accu-Chek Softclix Lancets lancets CHECK BLOOD SUGAR 2 TIMES A DAY   . amLODipine (NORVASC) 5 MG tablet Take 1 tablet (5 mg total) by mouth daily.   . atorvastatin (LIPITOR) 10 MG tablet Take 1 tablet (10 mg total) by mouth daily.   . blood glucose meter kit and supplies Dispense based on patient and insurance preference. Use up to four times daily as directed. (FOR ICD-10 E10.9, E11.9).   . carvedilol (COREG) 6.25 MG tablet Take 1 tablet (6.25 mg total) by mouth 2 (two) times daily with a meal.   . Cholecalciferol 25 MCG (1000 UT) capsule Take 1 capsule (1,000 Units total) by mouth daily.   . colchicine 0.6 MG tablet Take 0.6 mg by mouth daily as needed.   . fluticasone (FLONASE) 50 MCG/ACT nasal spray Use 2 sprays in each nostril daily 05/01/2019: Reports taking as needed  . furosemide (LASIX) 40 MG tablet TAKE 1 TABLET TWICE WEEKLY   . Insulin Glargine (BASAGLAR KWIKPEN) 100 UNIT/ML Inject 8 Units into the skin daily.   .  ketorolac (ACULAR) 0.5 % ophthalmic solution Place 1 drop into the right eye every morning.   . Lancets Misc. MISC Use as directed to check blood sugar up to 4x daily. E11.65   . levothyroxine (SYNTHROID) 75 MCG tablet TAKE 1 TABLET DAILY BEFORE BREAKFAST   . pantoprazole (PROTONIX) 20 MG tablet Take 1 tablet (20 mg total) by mouth daily. (Patient not taking: Reported on 02/14/2020)   . prednisoLONE acetate (PRED FORTE) 1 % ophthalmic suspension Place 1 drop into the right eye 4 (four) times daily. (Patient not taking: Reported on 02/14/2020)   . ULTICARE MICRO PEN NEEDLES 32G X 4 MM MISC TWICE DAILY    No facility-administered encounter medications on file as of 04/23/2020.     Objective:   Goals Addressed              This Visit's Progress     Patient Stated   .  I would like to check my blood sugar (pt-stated)        CARE PLAN ENTRY (see longitudinal plan of care for additional care plan information)  Current Barriers:  . Social, financial, community barriers:  . Diabetes: T2DM; complicated by chronic medical conditions including HTN, CKD, HLD, most recent A1c 7.9% . Most recent eGFR: 33 . Current antihyperglycemic regimen: BASAGLAR 8 UNITS daily . Reports hypoglycemic symptoms (reports at nighttime)--dizziness, lightheadedness, shaking, sweating o Patient is eating a light snack before bedtime and denies any hypoglycemia since   we last spoke . Denies hyperglycemic symptoms . Current exercise: n/a . Current blood glucose readings: FBG 104, post-FBG 200s . Cardiovascular risk reduction: o Current hypertensive regimen:amlodipine, carvedilol, not on ACE/ARB o Current hyperlipidemia regimen: atorvastatin o Current antiplatelet regimen: n/a  Pharmacist Clinical Goal(s):  . Over the next 90 days, patient will work with PharmD and primary care provider to address needs related to diabetes management  Interventions: . Comprehensive medication review performed, medication list  updated in electronic medical record . Inter-disciplinary care team collaboration (see longitudinal plan of care)--discussed case with CCM SW Scott . Highly encouraged patient to call endocrine with concerns of hypoglycemia and issues with checking blood sugar due to vision impairment . RX and clinical notes sent to [facility] to try and get Libre CGM covered for patient   Patient Self Care Activities:  . Patient will check blood glucose twice daily, document, and provide at future appointments . Patient will focus on medication adherence by continuing to take medications as prescribed . Patient will take medications as prescribed . Patient will contact provider with any episodes of hypoglycemia . Patient will report any questions or concerns to provider   Please see past updates related to this goal by clicking on the "Past Updates" button in the selected goal          Plan:   Telephone follow up appointment with CCM PharmD scheduled for: 04/30/20   SIGNATURE   Dattero , PharmD, BCPS Clinical Pharmacist, [facility] [place]  II Phone [phone]  

## 2020-04-23 NOTE — Patient Instructions (Signed)
Visit Information  Goals Addressed              This Visit's Progress     Patient Stated     I would like to check my blood sugar (pt-stated)        CARE PLAN ENTRY (see longitudinal plan of care for additional care plan information)  Current Barriers:   Social, financial, community barriers:   Diabetes: Y1OF; complicated by chronic medical conditions including HTN, CKD, HLD, most recent A1c 7.9%  Most recent eGFR: 33  Current antihyperglycemic regimen: BASAGLAR 8 UNITS daily  Reports hypoglycemic symptoms (reports at nighttime)--dizziness, lightheadedness, shaking, sweating o Patient is eating a light snack before bedtime and denies any hypoglycemia since we last spoke  Denies hyperglycemic symptoms  Current exercise: n/a  Current blood glucose readings: FBG 104, post-FBG 200s  Cardiovascular risk reduction: o Current hypertensive regimen:amlodipine, carvedilol, not on ACE/ARB o Current hyperlipidemia regimen: atorvastatin o Current antiplatelet regimen: n/a  Pharmacist Clinical Goal(s):   Over the next 90 days, patient will work with PharmD and primary care provider to address needs related to diabetes management  Interventions:  Comprehensive medication review performed, medication list updated in electronic medical record  Inter-disciplinary care team collaboration (see longitudinal plan of care)--discussed case with CCM SW Scott  Highly encouraged patient to call endocrine with concerns of hypoglycemia and issues with checking blood sugar due to vision impairment  RX and clinical notes sent to [facility] to try and get Libre CGM covered for patient   Patient Self Care Activities:   Patient will check blood glucose twice daily, document, and provide at future appointments  Patient will focus on medication adherence by continuing to take medications as prescribed  Patient will take medications as prescribed  Patient will contact provider with any  episodes of hypoglycemia  Patient will report any questions or concerns to provider   Please see past updates related to this goal by clicking on the "Past Updates" button in the selected goal         The patient verbalized understanding of instructions, educational materials, and care plan provided today and declined offer to receive copy of patient instructions, educational materials, and care plan.   Telephone follow up appointment with care management team member scheduled for: 04/30/20  SIGNATURE Regina Eck, PharmD, BCPS Clinical Pharmacist, [place]  II Phone [phone]

## 2020-05-01 ENCOUNTER — Telehealth: Payer: Self-pay | Admitting: Family Medicine

## 2020-05-01 NOTE — Telephone Encounter (Signed)
Pt does not qualify for CGM based on medical criteria - must be given 2 times a day and pt is only given 1 time a day.  Dr call back # [phone] Pt id # [id number]

## 2020-05-01 NOTE — Telephone Encounter (Signed)
Was this something you were working on?

## 2020-05-01 NOTE — Telephone Encounter (Signed)
Yes! Thank you.  RN please let patient know that she will not qualify for Freestyle Libre CGM because insurance requires that a patient must be on insulin twice a day or more.  She can call her insurance if she has any further questions.  I have exhausted all avenues for approval.  Thank you! Almyra Free

## 2020-05-01 NOTE — Telephone Encounter (Signed)
Attempted to contact patient - line busy x2 °

## 2020-05-06 NOTE — Telephone Encounter (Signed)
No call back  ?This encounter will be closed  ?

## 2020-05-08 ENCOUNTER — Ambulatory Visit: Payer: Medicare Other | Admitting: Licensed Clinical Social Worker

## 2020-05-08 DIAGNOSIS — E1159 Type 2 diabetes mellitus with other circulatory complications: Secondary | ICD-10-CM

## 2020-05-08 DIAGNOSIS — M810 Age-related osteoporosis without current pathological fracture: Secondary | ICD-10-CM

## 2020-05-08 DIAGNOSIS — N189 Chronic kidney disease, unspecified: Secondary | ICD-10-CM

## 2020-05-08 DIAGNOSIS — Z794 Long term (current) use of insulin: Secondary | ICD-10-CM

## 2020-05-08 DIAGNOSIS — E1169 Type 2 diabetes mellitus with other specified complication: Secondary | ICD-10-CM

## 2020-05-08 DIAGNOSIS — F411 Generalized anxiety disorder: Secondary | ICD-10-CM

## 2020-05-08 NOTE — Patient Instructions (Addendum)
Licensed Clinical Education officer, museum Visit Information  Goals we discussed today:     Client will talk with LCSW in next 30 days about management of medical needs and daily needs of client (pt-stated)        CARE PLAN ENTRY   Current Barriers:   Macular Degeneration: both eyes  Management of Diabetes is challenging  Patient with chronic diagnoses of CKD, Osteoporosis, Edema, HTN, Depression, HLD, Type 2 DM, Anxiety State  Clinical Social Work Clinical Goal(s):   LCSW will call client in next 30 days to talk with client about management of medical needs and daily needs of client  Interventions:  LCSW communicated with representative of [facility] to determine resources for client through that agency in [place], [place] Talked with client about her concerns related to [place] virus Talked with client about eye challenges of client(she has obtained new glasses) Talked with client about ADLs completion of client Talked with client about transport needs of client Talked with client about Medicaid application process  Talked with client about financial needs of client Talked with client about mobility of client (uses a cane to ambulate) Talked with client about [facility] transport services Talked with [name] about meal provision of client Talked with [name] about medication procurement of client Talked with [place] about pain issues (she said she has gout) Encouraged [name] to communicate with RNCM , [name], also as needed for CCM nursing support Client and LCSW talked about her challenges in managing Diabetes Talked with client about health needs of her spouse Talked with client about support for client with [facility] pharmacist, Dr. Lottie Dawson Talked with [name] about her upcoming medical appointments Talked with [name] about hearing needs of client Talked with [name] about her history of eye surgeries    Patient Self Care Activities:  Eats meals with set up  assistance  Patient Self care Deficits:  Macular Degeneration: Both Eyes   Initial goal documentation     Follow Up Plan: LCSW to call client in next 4 weeks to talk with her about managing medical needs of client and to talk with her about managing daily needs of client  Materials Provided: No  The patient verbalized understanding of instructions provided today and declined a print copy of patient instruction materials.   [name].Cloys Vera MSW, LCSW Licensed Clinical Social Worker [facility] Care Management [phone]

## 2020-05-08 NOTE — Chronic Care Management (AMB) (Signed)
Chronic Care Management    Clinical Social Work Follow Up Note  05/08/2020 Name: [name] MRN: [id number] DOB: December 19, 1945  [name] is a 74 y.o. year old female who is a primary care patient of [name], DO. The CCM team was consulted for assistance with [name] .   Review of patient status, including review of consultants reports, other relevant assessments, and collaboration with appropriate care team members and the patient's provider was performed as part of comprehensive patient evaluation and provision of chronic care management services.    SDOH (Social Determinants of Health) assessments performed: No; risk for stress; risk for depression; risk for transport needs; risk for financial strain; risk for physical inactivity  Flowsheet Row Chronic Care Management from 02/11/2020 in [place]  PHQ-9 Total Score 5     GAD 7 : Generalized Anxiety Score 02/11/2020 01/30/2020  Nervous, Anxious, on Edge 1 1  Control/stop worrying 1 2  Worry too much - different things 1 2  Trouble relaxing 0 2  Restless 0 2  Easily annoyed or irritable 0 3  Afraid - awful might happen 0 1  Total GAD 7 Score 3 13  Anxiety Difficulty Somewhat difficult -    Outpatient Encounter Medications as of 05/08/2020  Medication Sig Note  . ACCU-CHEK AVIVA PLUS test strip CHECK BLOOD SUGAR UP TO 4 TIMES A DAY   . Accu-Chek Softclix Lancets lancets CHECK BLOOD SUGAR 2 TIMES A DAY   . amLODipine (NORVASC) 5 MG tablet Take 1 tablet (5 mg total) by mouth daily.   Marland Kitchen atorvastatin (LIPITOR) 10 MG tablet Take 1 tablet (10 mg total) by mouth daily.   . blood glucose meter kit and supplies Dispense based on patient and insurance preference. Use up to four times daily as directed. (FOR ICD-10 E10.9, E11.9).   . carvedilol (COREG) 6.25 MG tablet Take 1 tablet (6.25 mg total) by mouth 2 (two) times daily with a meal.   . Cholecalciferol 25 MCG (1000 UT) capsule Take 1  capsule (1,000 Units total) by mouth daily.   . colchicine 0.6 MG tablet Take 0.6 mg by mouth daily as needed.   . fluticasone (FLONASE) 50 MCG/ACT nasal spray Use 2 sprays in each nostril daily 05/01/2019: Reports taking as needed  . furosemide (LASIX) 40 MG tablet TAKE 1 TABLET TWICE WEEKLY   . Insulin Glargine (BASAGLAR KWIKPEN) 100 UNIT/ML Inject 8 Units into the skin daily.   Marland Kitchen ketorolac (ACULAR) 0.5 % ophthalmic solution Place 1 drop into the right eye every morning.   . Lancets Misc. MISC Use as directed to check blood sugar up to 4x daily. E11.65   . levothyroxine (SYNTHROID) 75 MCG tablet TAKE 1 TABLET DAILY BEFORE BREAKFAST   . pantoprazole (PROTONIX) 20 MG tablet Take 1 tablet (20 mg total) by mouth daily. (Patient not taking: Reported on 02/14/2020)   . prednisoLONE acetate (PRED FORTE) 1 % ophthalmic suspension Place 1 drop into the right eye 4 (four) times daily. (Patient not taking: Reported on 02/14/2020)   . ULTICARE MICRO PEN NEEDLES 32G X 4 MM MISC TWICE DAILY    No facility-administered encounter medications on file as of 05/08/2020.    Goals    .  Client will talk with LCSW in next 30 days about management of medical needs and daily needs of client (pt-stated)      CARE PLAN ENTRY   Current Barriers:  Marland Kitchen Macular Degeneration: both eyes . Management of  Diabetes is challenging . Patient with chronic diagnoses of CKD, Osteoporosis, Edema, HTN, Depression, HLD, Type 2 DM, Anxiety State  Clinical Social Work Clinical Goal(s):  Marland Kitchen LCSW will call client in next 30 days to talk with client about management of medical needs and daily needs of client  Interventions:  LCSW communicated with representative of [facility] to determine resources for client through that agency in [employer], [place] Talked with client about her concerns related to [place] virus Talked with client about eye challenges of client (she has obtained new glasses) Talked with client about ADLs  completion of client Talked with client about transport needs of client Talked with client about Medicaid application process  Talked with client about financial needs of client Talked with client about mobility of client (uses a cane to ambulate) Talked with client about [facility] transport services Talked with [name] about meal provision of client Talked with [name] about medication procurement of client Talked with [place] about pain issues (she said she has gout) Encouraged [name] to communicate with RNCM , [name], also as needed for CCM nursing support Client and LCSW talked about her challenges in managing Diabetes Talked with client about health needs of her spouse Talked with client about support for client with [facility] pharmacist, Dr. Lottie Dawson Talked with [name] about her upcoming medical appointments Talked with [name] about hearing needs of client Talked with [name] about her history of eye surgeries    Patient Self Care Activities:  Eats meals with set up assistance  Patient Self care Deficits:  Macular Degeneration: Both Eyes   Initial goal documentation     Follow Up Plan: LCSW to call client in next 4 weeks to talk with her about managing medical needs of client and to talk with her about managing daily needs of client  [name].Sorayah Schrodt MSW, LCSW Licensed Clinical Social Worker [facility] [phone]

## 2020-05-20 ENCOUNTER — Other Ambulatory Visit: Payer: Self-pay | Admitting: "Endocrinology

## 2020-05-28 ENCOUNTER — Other Ambulatory Visit: Payer: Self-pay

## 2020-05-28 ENCOUNTER — Ambulatory Visit (INDEPENDENT_AMBULATORY_CARE_PROVIDER_SITE_OTHER): Payer: Medicare Other | Admitting: Family Medicine

## 2020-05-28 VITALS — BP 144/64 | HR 65 | Temp 97.8°F | Ht 62.0 in | Wt 150.0 lb

## 2020-05-28 DIAGNOSIS — Z794 Long term (current) use of insulin: Secondary | ICD-10-CM

## 2020-05-28 DIAGNOSIS — E785 Hyperlipidemia, unspecified: Secondary | ICD-10-CM

## 2020-05-28 DIAGNOSIS — N1832 Chronic kidney disease, stage 3b: Secondary | ICD-10-CM

## 2020-05-28 DIAGNOSIS — E1159 Type 2 diabetes mellitus with other circulatory complications: Secondary | ICD-10-CM

## 2020-05-28 DIAGNOSIS — E1122 Type 2 diabetes mellitus with diabetic chronic kidney disease: Secondary | ICD-10-CM | POA: Diagnosis not present

## 2020-05-28 DIAGNOSIS — I152 Hypertension secondary to endocrine disorders: Secondary | ICD-10-CM

## 2020-05-28 DIAGNOSIS — E1169 Type 2 diabetes mellitus with other specified complication: Secondary | ICD-10-CM | POA: Diagnosis not present

## 2020-05-28 LAB — BAYER DCA HB A1C WAIVED: HB A1C (BAYER DCA - WAIVED): 7.5 % — ABNORMAL HIGH (ref <7.0)

## 2020-05-28 NOTE — Progress Notes (Signed)
Subjective: CC: DM PCP: [name], DO LFY:BOFBP [name] is a 75 y.o. female presenting to clinic today for:  1. Type 2 Diabetes with hypertension, hyperlipidemia:  Patient is followed by endocrinology with next visit at the end of the month. She notes that blood sugars go up and down but she is to keep her blood sugars a little towards the higher end so as to avoid hypoglycemia per endocrinology. Does not report any polydipsia or polyuria. Vision is stable but she did get new glasses recently. She is interested in her Shingrix vaccine.  Additionally, she notes that she had horrible difficulty checking her sugar and administering her insulin when her husband was in the hospital. Typically he is the one that manages this because her vision is so poor.  Last eye exam: Needs Last foot exam: Needs Last A1c:  Lab Results  Component Value Date   HGBA1C 7.5 ([name]) 01/30/2020   Nephropathy screen indicated?: Needs Last flu, zoster and/or pneumovax:  Immunization History  Administered Date(s) Administered  . Fluad Quad(high Dose 65+) 02/05/2019, 03/05/2020  . Influenza, High Dose Seasonal PF 03/05/2016, 03/14/2017, 03/01/2018  . Influenza,inj,Quad PF,6+ Mos 02/16/2013, 02/27/2015  . PFIZER SARS-COV-2 Vaccination 06/13/2019, 07/04/2019, 03/05/2020  . Pneumococcal Conjugate-13 10/11/2014  . Pneumococcal Polysaccharide-23 10/10/2012  . Tdap 10/06/2012    ROS: Per HPI  Allergies  Allergen Reactions  . Aspirin Nausea And Vomiting  . Ciprofloxacin Other (See Comments)    Upset Stomach  . Codeine Nausea And Vomiting    Makes me sick   . Losartan     Hyperkalemia   . Micardis [Telmisartan] Other (See Comments)    unknown  . Nexium [Esomeprazole Magnesium] Other (See Comments)    Causes internal bleeding  . Niaspan [Niacin Er] Other (See Comments)    Reaction is unknown  . Onglyza [Saxagliptin] Other (See Comments)    Reaction is unknown  . Other     No otc pain  medications  . Rofecoxib Other (See Comments)    unknown  . Simvastatin   . Tequin [Gatifloxacin] Other (See Comments)    Reaction is unknown  . Welchol [Colesevelam Hcl]   . Amoxicillin Nausea And Vomiting and Rash    Has patient had a PCN reaction causing immediate rash, facial/tongue/throat swelling, SOB or lightheadedness with hypotension: Yes Has patient had a PCN reaction causing severe rash involving mucus membranes or skin necrosis: no  Has patient had a PCN reaction that required hospitalization No Has patient had a PCN reaction occurring within the last 10 years: No If all of the above answers are "NO", then may proceed with Cephalosporin use.    Past Medical History:  Diagnosis Date  . Anal fistula   . Anemia in chronic renal disease    Aranesp injection --  when Hg <11, last injection 12-24-14.  Marland Kitchen Anxiety   . Aphakia of eye, right 10/02/2019   Condition resolved, status post vitrectomy, removal IOL, insertion Yamani scleral tunnel Ze[facility] CT Lucio lens June 2021  . Arthritis    knees and hand/fingers. "broke back"-being evaluated for this"weakness left leg"  . Cataract    both eyes done  . 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 CKD (chronic kidney disease), stage III [facility])    nephrologist--  dr Mercy Moore-- [place]  07-09-2016  . Complication of anesthesia    post-op confusion   . Constipation   . Coronary artery disease   . Diabetic gastroparesis (  [place])   . Diabetic retinopathy ([place])    bilateral --  monitored by dr [name]  . Diverticulosis of colon   . GAD (generalized anxiety disorder)   . GERD (gastroesophageal reflux disease)   . History of colon polyps    benign  . History of esophagitis   . History of GI bleed    upper 2009  due to esophagitis  &  2002  due to Mallory-Weiss tear  . History of hyperkalemia    pt had previously been canceled twice dos due to elevated K+, 07-29-2016 last date cancelled -- pt visited pcp same day treated  w/ kayexelate and K+ came down, pt brought all her medication's in to pcp office and found pt was taking losartan that had been discontinued due to ckd, pcp stated this was cause of elevated K+  . 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 History of rectal abscess    12-29-2004  bedside I & D  . Hyperlipidemia   . Hypertension   . Hypothyroidism   . LAFB (left anterior fascicular block)   . Right bundle branch block   . Sacral decubitus ulcer    since 2014- 01-02-15 remains with wound" gauze dressing changes daily.  . Type II diabetes mellitus ([place])     Current Outpatient Medications:  .  ACCU-CHEK AVIVA PLUS test strip, CHECK BLOOD SUGAR UP TO 4 TIMES A DAY, Disp: 200 strip, Rfl: 0 .  Accu-Chek Softclix Lancets lancets, CHECK BLOOD SUGAR 2 TIMES A DAY, Disp: 100 each, Rfl: 0 .  amLODipine (NORVASC) 5 MG tablet, Take 1 tablet (5 mg total) by mouth daily., Disp: 90 tablet, Rfl: 3 .  atorvastatin (LIPITOR) 10 MG tablet, Take 1 tablet (10 mg total) by mouth daily., Disp: 90 tablet, Rfl: 3 .  blood glucose meter kit and supplies, Dispense based on patient and insurance preference. Use up to four times daily as directed. (FOR ICD-10 E10.9, E11.9)., Disp: 1 each, Rfl: 0 .  carvedilol (COREG) 6.25 MG tablet, Take 1 tablet (6.25 mg total) by mouth 2 (two) times daily with a meal., Disp: 180 tablet, Rfl: 3 .  Cholecalciferol 25 MCG (1000 UT) capsule, Take 1 capsule (1,000 Units total) by mouth daily., Disp: 90 capsule, Rfl: 3 .  colchicine 0.6 MG tablet, Take 0.6 mg by mouth daily as needed., Disp: , Rfl:  .  fluticasone (FLONASE) 50 MCG/ACT nasal spray, Use 2 sprays in each nostril daily, Disp: , Rfl:  .  furosemide (LASIX) 40 MG tablet, TAKE 1 TABLET TWICE WEEKLY, Disp: 26 tablet, Rfl: 2 .  Insulin Glargine (BASAGLAR KWIKPEN) 100 UNIT/ML, Inject 8 Units into the skin daily., Disp: 9 mL, Rfl: 3 .  ketorolac (ACULAR) 0.5 % ophthalmic solution, Place 1 drop into the right eye every  morning., Disp: 5 mL, Rfl: 12 .  Lancets Misc. MISC, Use as directed to check blood sugar up to 4x daily. E11.65, Disp: 300 each, Rfl: 3 .  levothyroxine (SYNTHROID) 75 MCG tablet, TAKE 1 TABLET DAILY BEFORE BREAKFAST, Disp: 90 tablet, Rfl: 3 .  pantoprazole (PROTONIX) 20 MG tablet, Take 1 tablet (20 mg total) by mouth daily. (Patient not taking: Reported on 02/14/2020), Disp: 90 tablet, Rfl: 3 .  prednisoLONE acetate (PRED FORTE) 1 % ophthalmic suspension, Place 1 drop into the right eye 4 (four) times daily. (Patient not taking: Reported on 02/14/2020), Disp: 5 mL, Rfl: 0 .  ULTICARE MICRO PEN NEEDLES 32G X 4 MM  MISC, TWICE DAILY, Disp: 100 each, Rfl: 5 Social History   Socioeconomic History  . Marital status: Married    Spouse name: [name]  . Number of children: 0  . Years of education: 72  . Highest education level: Not on file  Occupational History  . Occupation: Retired    Fish farm manager: RETIRED  Tobacco Use  . Smoking status: Never Smoker  . Smokeless tobacco: Never Used  Vaping Use  . Vaping Use: Never used  Substance and Sexual Activity  . Alcohol use: No  . Drug use: No  . Sexual activity: Not Currently  Other Topics Concern  . Not on file  Social History Narrative   Lives with husband.    Caffeine use: none   Social Determinants of Health   Financial Resource Strain: Low Risk   . Difficulty of Paying Living Expenses: Not hard at all  Food Insecurity: No Food Insecurity  . Worried About Charity fundraiser in the Last Year: Never true  . Ran Out of Food in the Last Year: Never true  Transportation Needs: Unmet Transportation Needs  . Lack of Transportation (Medical): Yes  . Lack of Transportation (Non-Medical): No  Physical Activity: Not on file  Stress: Not on file  Social Connections: Not on file  Intimate Partner Violence: Not At Risk  . Fear of Current or Ex-Partner: No  . Emotionally Abused: No  . Physically Abused: No  . Sexually Abused: No   Family History   Problem Relation Age of Onset  . Colon cancer Father 60  . Prostate cancer Father   . Diabetes Father   . Coronary artery disease Mother 41  . Heart disease Mother   . Diabetes Mother   . Coronary artery disease Sister 60  . Diabetes Sister   . Heart disease Sister   . Diabetes Sister   . Diabetes Sister   . Diabetes Sister   . Diabetes Sister   . Diabetes Sister   . Diabetes Maternal Grandmother   . Esophageal cancer Neg Hx   . Stomach cancer Neg Hx   . Rectal cancer Neg Hx     Objective: Office vital signs reviewed. BP (!) 144/64   Pulse 65   Temp 97.8 F (36.6 C) (Temporal)   Ht '5\' 2"'  (1.575 m)   Wt 150 lb (68 kg)   BMI 27.44 kg/m   Physical Examination:  General: Awake, alert, chronically ill-appearing, No acute distress HEENT: Normal; moist mucous membranes Cardio: regular rate and rhythm, S1S2 heard, no murmurs appreciated Pulm: clear to auscultation bilaterally, no wheezes, rhonchi or rales; normal work of breathing on room air Extremities: warm, well perfused, No edema, cyanosis or clubbing; +2 pulses bilaterally Neuro: see DM foot  Diabetic Foot Exam - Simple   Simple Foot Form Diabetic Foot exam was performed with the following findings: Yes 05/28/2020 10:15 AM  Visual Inspection See comments: Yes Sensation Testing Intact to touch and monofilament testing bilaterally: Yes Pulse Check Posterior Tibialis and Dorsalis pulse intact bilaterally: Yes Comments She does have some deformity of the middle toe on the right. ?  Previous surgical intervention.  No evidence of callus or preulcerative calluses     Assessment/ Plan: 75 y.o. female   Type 2 diabetes mellitus with stage 3b chronic kidney disease, with long-term current use of insulin ([place]) - Plan: Renal Function Panel, Bayer DCA Hb A1c Waived, Microalbumin / creatinine urine ratio  Hyperlipidemia associated with type 2 diabetes mellitus ([place])  Hypertension associated  with diabetes ([place])  A1c is  unchanged from previous.  Per her report, her endocrinologist does want her on the higher end of normal.  I think that an A1c of 7.5 in this patient is likely okay for now if she is at high risk of hypoglycemic episodes.  I have coordinated with Almyra Free about the audible glucose meter.  Apparently Nicki Reaper is trying to figure out local resources to get this covered for the patient.  Her insurance has previously denied her for the freestyle libre.  She is to keep her follow-up with endocrinology at the end of the month  She will continue her home blood pressure medications and statin.  Blood pressure was controlled upon recheck  Orders Placed This Encounter  Procedures  . Renal Function Panel  . Bayer DCA Hb A1c Waived   No orders of the defined types were placed in this encounter.    [name], DO [place] [phone]

## 2020-05-29 LAB — MICROALBUMIN / CREATININE URINE RATIO
Creatinine, Urine: 51.1 mg/dL
Microalb/Creat Ratio: 318 mg/g creat — ABNORMAL HIGH (ref 0–29)
Microalbumin, Urine: 162.4 ug/mL

## 2020-05-29 LAB — RENAL FUNCTION PANEL
Albumin: 4.1 g/dL (ref 3.7–4.7)
BUN/Creatinine Ratio: 15 (ref 12–28)
BUN: 26 mg/dL (ref 8–27)
CO2: 23 mmol/L (ref 20–29)
Calcium: 9.2 mg/dL (ref 8.7–10.3)
Chloride: 107 mmol/L — ABNORMAL HIGH (ref 96–106)
Creatinine, Ser: 1.73 mg/dL — ABNORMAL HIGH (ref 0.57–1.00)
GFR calc Af Amer: 33 mL/min/{1.73_m2} — ABNORMAL LOW (ref >59)
GFR calc non Af Amer: 29 mL/min/{1.73_m2} — ABNORMAL LOW (ref >59)
Glucose: 109 mg/dL — ABNORMAL HIGH (ref 65–99)
Phosphorus: 4.1 mg/dL (ref 3.0–4.3)
Potassium: 5.1 mmol/L (ref 3.5–5.2)
Sodium: 145 mmol/L — ABNORMAL HIGH (ref 134–144)

## 2020-06-09 ENCOUNTER — Telehealth: Payer: Self-pay | Admitting: Nurse Practitioner

## 2020-06-09 NOTE — Telephone Encounter (Signed)
Pt called today to verify her appt. I informed pt to make sure she does her blood work if she has not already. Pt states she did all her labs at [facility] like she does every time. I see they drew their office labs but not the labs we ordered. Can you check with that office and see? The pt is saying they drew all the labs and pt would like a call back when we know.

## 2020-06-09 NOTE — Telephone Encounter (Signed)
Called [facility] but they are closed today due to inclement weather. Will try again tomorrow. Called pt and made her aware. Pt had her A1c and microalbumin done and wants to keep her appointment to review.

## 2020-06-13 ENCOUNTER — Ambulatory Visit: Payer: Medicare Other | Admitting: Licensed Clinical Social Worker

## 2020-06-13 DIAGNOSIS — E785 Hyperlipidemia, unspecified: Secondary | ICD-10-CM

## 2020-06-13 DIAGNOSIS — F411 Generalized anxiety disorder: Secondary | ICD-10-CM

## 2020-06-13 DIAGNOSIS — N189 Chronic kidney disease, unspecified: Secondary | ICD-10-CM

## 2020-06-13 DIAGNOSIS — E1159 Type 2 diabetes mellitus with other circulatory complications: Secondary | ICD-10-CM

## 2020-06-13 DIAGNOSIS — M810 Age-related osteoporosis without current pathological fracture: Secondary | ICD-10-CM

## 2020-06-13 DIAGNOSIS — E1122 Type 2 diabetes mellitus with diabetic chronic kidney disease: Secondary | ICD-10-CM

## 2020-06-13 NOTE — Patient Instructions (Addendum)
Licensed Clinical Education officer, museum Visit Information  Goals we discussed today:    Client will talk with [name] in next 30 days about management of medical needs and daily needs of client (pt-stated)        CARE PLAN ENTRY   Current Barriers:   Macular Degeneration: both eyes  Management of Diabetes is challenging  Patient with chronic diagnoses of CKD, Osteoporosis, Edema, HTN, Depression, HLD, Type 2 DM, Anxiety State  Clinical Social Work Clinical Goal(s):   [name] will call client in next 30 days to talk with client about management of medical needs and daily needs of client  Interventions:  Talked with client about her communication with representative of [facility] [employer] (client said representative of [employer] will visit client on 06/16/2020 at home of client at 11:00 AM)  Talked with client about appetite of client  Talked with client about eye challenges of client  Talked with client about ADLs completion of client  Talked with client about transport needs of client  Talked with client about mobility of client (uses a cane to ambulate)  Talked with client about [facility] transport services  Talked with [name] about financial challenges of client  Talked with [name] about meal provision of client  Talked with [name] about medication procurement of client  Talked with [name] about pain issues (she said she has gout)  Talked with [name] about [employer] [facility] Triage Nurse as a resource for client  Encouraged [name] to communicate with RNCM , [name], also as needed for CCM nursing support  Client and [name] talked about her challenges in managing Diabetes  Talked with client about her upcoming medical appointments  Patient Self Care Activities:  Eats meals with set up assistance  Patient Self care Deficits:  Macular Degeneration: Both Eyes   Initial goal documentation     Follow Up Plan:[name] to call client in next 4 weeks to talk with her  about managing medical needs of client and to talk with her about managing daily needs of client  Materials Provided: No  The patient verbalized understanding of instructions provided today and declined a print copy of patient instruction materials.   [name].[name] [name], [name] Licensed Clinical Social Worker [facility] Care Management [phone]

## 2020-06-13 NOTE — Chronic Care Management (AMB) (Signed)
Chronic Care Management    Clinical Social Work Follow Up Note  06/13/2020 Name: [name] MRN: [id number] DOB: 22-Feb-1946  [name] is a 75 y.o. year old female who is a primary care patient of [name], DO. The CCM team was consulted for assistance with [name] .   Review of patient status, including review of consultants reports, other relevant assessments, and collaboration with appropriate care team members and the patient's provider was performed as part of comprehensive patient evaluation and provision of chronic care management services.    SDOH (Social Determinants of Health) assessments performed: No; risk for depression; risk for tobacco use; risk for financial strain; risk for transport needs  [place] Office Visit from 05/28/2020 in [facility]  PHQ-9 Total Score 0      GAD 7 : Generalized Anxiety Score 02/11/2020 01/30/2020  Nervous, Anxious, on Edge 1 1  Control/stop worrying 1 2  Worry too much - different things 1 2  Trouble relaxing 0 2  Restless 0 2  Easily annoyed or irritable 0 3  Afraid - awful might happen 0 1  Total GAD 7 Score 3 13  Anxiety Difficulty Somewhat difficult -    Outpatient Encounter Medications as of 06/13/2020  Medication Sig Note   ACCU-CHEK AVIVA PLUS test strip CHECK BLOOD SUGAR UP TO 4 TIMES A DAY    Accu-Chek Softclix Lancets lancets CHECK BLOOD SUGAR 2 TIMES A DAY    amLODipine (NORVASC) 5 MG tablet Take 1 tablet (5 mg total) by mouth daily.    atorvastatin (LIPITOR) 10 MG tablet Take 1 tablet (10 mg total) by mouth daily.    blood glucose meter kit and supplies Dispense based on patient and insurance preference. Use up to four times daily as directed. (FOR ICD-10 E10.9, E11.9).    carvedilol (COREG) 6.25 MG tablet Take 1 tablet (6.25 mg total) by mouth 2 (two) times daily with a meal.    Cholecalciferol 25 MCG (1000 UT) capsule Take 1 capsule (1,000 Units total) by mouth  daily.    colchicine 0.6 MG tablet Take 0.6 mg by mouth daily as needed.    fluticasone (FLONASE) 50 MCG/ACT nasal spray Use 2 sprays in each nostril daily 05/01/2019: Reports taking as needed   furosemide (LASIX) 40 MG tablet TAKE 1 TABLET TWICE WEEKLY    Insulin Glargine (BASAGLAR KWIKPEN) 100 UNIT/ML Inject 8 Units into the skin daily.    ketorolac (ACULAR) 0.5 % ophthalmic solution Place 1 drop into the right eye every morning.    Lancets Misc. MISC Use as directed to check blood sugar up to 4x daily. E11.65    levothyroxine (SYNTHROID) 75 MCG tablet TAKE 1 TABLET DAILY BEFORE BREAKFAST    pantoprazole (PROTONIX) 20 MG tablet Take 1 tablet (20 mg total) by mouth daily.    prednisoLONE acetate (PRED FORTE) 1 % ophthalmic suspension Place 1 drop into the right eye 4 (four) times daily.    ULTICARE MICRO PEN NEEDLES 32G X 4 MM MISC TWICE DAILY    No facility-administered encounter medications on file as of 06/13/2020.    Goals      Client will talk with LCSW in next 30 days about management of medical needs and daily needs of client (pt-stated)      CARE PLAN ENTRY   Current Barriers:   Macular Degeneration: both eyes  Management of Diabetes is challenging  Patient with chronic diagnoses of CKD, Osteoporosis, Edema, HTN, Depression, HLD,  Type 2 DM, Anxiety State  Clinical Social Work Clinical Goal(s):   LCSW will call client in next 30 days to talk with client about management of medical needs and daily needs of client  Interventions:  Talked with client about her communication with representative of [facility] [employer] (client said representative of [employer] will visit client on 06/16/2020 at home of client at 11:00 AM)  Talked with client about appetite of client  Talked with client about eye challenges of client  Talked with client about ADLs completion of client  Talked with client about transport needs of client  Talked with client about  mobility of client (uses a cane to ambulate)  Talked with client about [facility] transport services  Talked with Ireta about financial challenges of client  Talked with Kamya about meal provision of client  Talked with [name] about medication procurement of client  Talked with Luka about pain issues (she said she has gout)  Talked with [name] about [facility] Triage Nurse as a resource for client  Encouraged Cassanda to communicate with RNCM , [name], also as needed for CCM nursing support  Client and LCSW talked about her challenges in managing Diabetes  Talked with client about her upcoming medical appointments  Patient Self Care Activities:  Eats meals with set up assistance  Patient Self care Deficits:  Macular Degeneration: Both Eyes   Initial goal documentation     Follow Up Plan: LCSW to call client in next 4 weeks to talk with her about managing medical needs of client and to talk with her about managing daily needs of client  [name].William Schake MSW, LCSW Licensed Clinical Social Worker [facility] Care Management [phone]

## 2020-06-17 NOTE — Telephone Encounter (Signed)
Yes, I can still see her

## 2020-06-17 NOTE — Telephone Encounter (Signed)
Please advise if you still want to see patient as the thyroid test were not done.

## 2020-06-17 NOTE — Telephone Encounter (Signed)
Whitney says yes she will still see her

## 2020-06-17 NOTE — Telephone Encounter (Signed)
No thyroid tests were obtained

## 2020-06-17 NOTE — Telephone Encounter (Signed)
This patient has an appt tomorrow. Did we ever find out about labs

## 2020-06-18 ENCOUNTER — Other Ambulatory Visit: Payer: Self-pay

## 2020-06-18 ENCOUNTER — Ambulatory Visit (INDEPENDENT_AMBULATORY_CARE_PROVIDER_SITE_OTHER): Payer: Medicare Other | Admitting: Nurse Practitioner

## 2020-06-18 ENCOUNTER — Encounter: Payer: Self-pay | Admitting: Nurse Practitioner

## 2020-06-18 VITALS — BP 160/76 | HR 69 | Ht 62.0 in | Wt 155.0 lb

## 2020-06-18 DIAGNOSIS — E782 Mixed hyperlipidemia: Secondary | ICD-10-CM | POA: Diagnosis not present

## 2020-06-18 DIAGNOSIS — N1832 Chronic kidney disease, stage 3b: Secondary | ICD-10-CM

## 2020-06-18 DIAGNOSIS — I10 Essential (primary) hypertension: Secondary | ICD-10-CM

## 2020-06-18 DIAGNOSIS — Z794 Long term (current) use of insulin: Secondary | ICD-10-CM | POA: Diagnosis not present

## 2020-06-18 DIAGNOSIS — E1122 Type 2 diabetes mellitus with diabetic chronic kidney disease: Secondary | ICD-10-CM

## 2020-06-18 DIAGNOSIS — E559 Vitamin D deficiency, unspecified: Secondary | ICD-10-CM

## 2020-06-18 DIAGNOSIS — E039 Hypothyroidism, unspecified: Secondary | ICD-10-CM

## 2020-06-18 NOTE — Progress Notes (Signed)
06/18/2020   Endocrinology follow-up note   Subjective:    Patient ID: [name], female    DOB: 27-Dec-1945.  She is being seen in follow-up in the management of uncontrolled type 2 diabetes, hypothyroidism, hypertension, hyperlipidemia.  Past Medical History:  Diagnosis Date  . Anal fistula   . Anemia in chronic renal disease    Aranesp injection --  when Hg <11, last injection 12-24-14.  Marland Kitchen Anxiety   . Aphakia of eye, right 10/02/2019   Condition resolved, status post vitrectomy, removal IOL, insertion Yamani scleral tunnel [facility] CT Lucio lens June 2021  . Arthritis    knees and hand/fingers. "broke back"-being evaluated for this"weakness left leg"  . Cataract    both eyes done  . 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 CKD (chronic kidney disease), stage III [facility])    nephrologist--  dr Mercy Moore-- [place]  07-09-2016  . Complication of anesthesia    post-op confusion   . Constipation   . Coronary artery disease   . Diabetic gastroparesis ([place])   . Diabetic retinopathy ([place])    bilateral --  monitored by dr [name]  . Diverticulosis of colon   . GAD (generalized anxiety disorder)   . GERD (gastroesophageal reflux disease)   . History of colon polyps    benign  . History of esophagitis   . History of GI bleed    upper 2009  due to esophagitis  &  2002  due to Mallory-Weiss tear  . History of hyperkalemia    pt had previously been canceled twice dos due to elevated K+, 07-29-2016 last date cancelled -- pt visited pcp same day treated w/ kayexelate and K+ came down, pt brought all her medication's in to pcp office and found pt was taking losartan that had been discontinued due to ckd, pcp stated this was cause of elevated K+  . 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 History of rectal abscess    12-29-2004  bedside I & D  . Hyperlipidemia   . Hypertension   . Hypothyroidism   . LAFB (left anterior fascicular block)   .  Right bundle branch block   . Sacral decubitus ulcer    since 2014- 01-02-15 remains with wound" gauze dressing changes daily.  . Type II diabetes mellitus ([place])    Past Surgical History:  Procedure Laterality Date  . APPLICATION OF A-CELL OF EXTREMITY N/A 04/07/2015   Procedure: A CELL PLACMENT;  Surgeon: Loel Lofty Dillingham, DO;  Location: [place];  Service: Plastics;  Laterality: N/A;  . CARDIOVASCULAR STRESS TEST  12-30-2004   normal perfusion study/  no ischemia or infartion/  normal LV wall function and wall motion , ef 66%  . CATARACT EXTRACTION W/ INTRAOCULAR LENS  IMPLANT, BILATERAL  1995  . COLONOSCOPY  2008   w/Dr.Brodie   . COLONOSCOPY W/ POLYPECTOMY  last one 2008  . COMPRESSION HIP SCREW Right 05/18/2014   Procedure: COMPRESSION HIP;  Surgeon: Carole Civil, MD;  Location: [facility];  Service: Orthopedics;  Laterality: Right;  . ESOPHAGOGASTRODUODENOSCOPY  last one 01-09-2011  . EVALUATION UNDER ANESTHESIA WITH FISTULECTOMY N/A 01/06/2015   Procedure: EXAM UNDER ANESTHESIA , placement of seton;  Surgeon: Jackolyn Confer, MD;  Location: [facility];  Service: General;  Laterality: N/A;  . I & D EXTREMITY N/A 04/07/2015   Procedure: IRRIGATION AND DEBRIDEMENT ISCHIAL ULCER;  Surgeon: Loel Lofty Dillingham, DO;  Location: [place];  Service: Plastics;  Laterality: N/A;  . INCISION AND DRAINAGE OF WOUND N/A 09/30/2014   Procedure: IRRIGATION AND DEBRIDEMENT SACRAL WOUND, EXCISION OF PERIRECTAL TRACT WITH PLACEMENT OF ACCELL;  Surgeon: Theodoro Kos, DO;  Location: [place];  Service: Plastics;  Laterality: N/A;  . LIGATION OF INTERNAL FISTULA TRACT N/A 10/22/2016   Procedure: LIGATION OF INTERNAL FISTULA TRACT;  Surgeon: Leighton Ruff, MD;  Location: [facility];  Service: General;  Laterality: N/A;  . ORIF FEMUR FRACTURE Left 10/09/2012   Procedure: OPEN REDUCTION INTERNAL FIXATION (ORIF) DISTAL FEMUR FRACTURE;  Surgeon: Rozanna Box, MD;  Location: [place];   Service: Orthopedics;  Laterality: Left;  . RETINOPATHY SURGERY Bilateral 1980's?  . TRANSTHORACIC ECHOCARDIOGRAM  02-18-2011   mild LVH,  ef 55-60%,  grade I diastolic dysfunction/  mild TR/  RV systolic pressure increased consistant with moderate pulmonary hypertension   Social History   Socioeconomic History  . Marital status: Married    Spouse name: [name]  . Number of children: 0  . Years of education: 33  . Highest education level: Not on file  Occupational History  . Occupation: Retired    Fish farm manager: RETIRED  Tobacco Use  . Smoking status: Never Smoker  . Smokeless tobacco: Never Used  Vaping Use  . Vaping Use: Never used  Substance and Sexual Activity  . Alcohol use: No  . Drug use: No  . Sexual activity: Not Currently  Other Topics Concern  . Not on file  Social History Narrative   Lives with husband.    Caffeine use: none   Social Determinants of Health   Financial Resource Strain: Low Risk   . Difficulty of Paying Living Expenses: Not hard at all  Food Insecurity: No Food Insecurity  . Worried About Charity fundraiser in the Last Year: Never true  . Ran Out of Food in the Last Year: Never true  Transportation Needs: Unmet Transportation Needs  . Lack of Transportation (Medical): Yes  . Lack of Transportation (Non-Medical): No  Physical Activity: Not on file  Stress: Not on file  Social Connections: Not on file   Outpatient Encounter Medications as of 06/18/2020  Medication Sig  . ACCU-CHEK AVIVA PLUS test strip CHECK BLOOD SUGAR UP TO 4 TIMES A DAY  . Accu-Chek Softclix Lancets lancets CHECK BLOOD SUGAR 2 TIMES A DAY  . amLODipine (NORVASC) 5 MG tablet Take 1 tablet (5 mg total) by mouth daily.  Marland Kitchen atorvastatin (LIPITOR) 10 MG tablet Take 1 tablet (10 mg total) by mouth daily.  . blood glucose meter kit and supplies Dispense based on patient and insurance preference. Use up to four times daily as directed. (FOR ICD-10 E10.9, E11.9).  . carvedilol (COREG) 6.25  MG tablet Take 1 tablet (6.25 mg total) by mouth 2 (two) times daily with a meal.  . Cholecalciferol 25 MCG (1000 UT) capsule Take 1 capsule (1,000 Units total) by mouth daily.  . colchicine 0.6 MG tablet Take 0.6 mg by mouth daily as needed.  . fluticasone (FLONASE) 50 MCG/ACT nasal spray Use 2 sprays in each nostril daily  . furosemide (LASIX) 40 MG tablet TAKE 1 TABLET TWICE WEEKLY  . Insulin Glargine (BASAGLAR KWIKPEN) 100 UNIT/ML Inject 8 Units into the skin daily.  Marland Kitchen ketorolac (ACULAR) 0.5 % ophthalmic solution Place 1 drop into the right eye every morning.  . Lancets Misc. MISC Use as directed to check blood sugar up to 4x daily. E11.65  .  levothyroxine (SYNTHROID) 75 MCG tablet TAKE 1 TABLET DAILY BEFORE BREAKFAST  . pantoprazole (PROTONIX) 20 MG tablet Take 1 tablet (20 mg total) by mouth daily.  . prednisoLONE acetate (PRED FORTE) 1 % ophthalmic suspension Place 1 drop into the right eye 4 (four) times daily.  Marland Kitchen ULTICARE MICRO PEN NEEDLES 32G X 4 MM MISC TWICE DAILY   No facility-administered encounter medications on file as of 06/18/2020.   ALLERGIES: Allergies  Allergen Reactions  . Aspirin Nausea And Vomiting  . Ciprofloxacin Other (See Comments)    Upset Stomach  . Codeine Nausea And Vomiting    Makes me sick   . Losartan     Hyperkalemia   . Micardis [Telmisartan] Other (See Comments)    unknown  . Nexium [Esomeprazole Magnesium] Other (See Comments)    Causes internal bleeding  . Niaspan [Niacin Er] Other (See Comments)    Reaction is unknown  . Onglyza [Saxagliptin] Other (See Comments)    Reaction is unknown  . Other     No otc pain medications  . Rofecoxib Other (See Comments)    unknown  . Simvastatin   . Tequin [Gatifloxacin] Other (See Comments)    Reaction is unknown  . Welchol [Colesevelam Hcl]   . Amoxicillin Nausea And Vomiting and Rash    Has patient had a PCN reaction causing immediate rash, facial/tongue/throat swelling, SOB or lightheadedness  with hypotension: Yes Has patient had a PCN reaction causing severe rash involving mucus membranes or skin necrosis: no  Has patient had a PCN reaction that required hospitalization No Has patient had a PCN reaction occurring within the last 10 years: No If all of the above answers are "NO", then may proceed with Cephalosporin use.    VACCINATION STATUS: Immunization History  Administered Date(s) Administered  . Fluad Quad(high Dose 65+) 02/05/2019, 03/05/2020  . Influenza, High Dose Seasonal PF 03/05/2016, 03/14/2017, 03/01/2018  . Influenza,inj,Quad PF,6+ Mos 02/16/2013, 02/27/2015  . PFIZER(Purple Top)SARS-COV-2 Vaccination 06/13/2019, 07/04/2019, 03/05/2020  . Pneumococcal Conjugate-13 10/11/2014  . Pneumococcal Polysaccharide-23 10/10/2012  . Tdap 10/06/2012    Diabetes She presents for her follow-up diabetic visit. She has type 2 diabetes mellitus. Onset time: She was diagnosed at approximate age of 17 years . Her disease course has been stable. There are no hypoglycemic associated symptoms. Pertinent negatives for hypoglycemia include no confusion, headaches, nervousness/anxiousness, pallor, seizures or tremors. Pertinent negatives for diabetes include no chest pain, no fatigue, no polydipsia, no polyphagia and no visual change. There are no hypoglycemic complications. Symptoms are stable. Diabetic complications include heart disease, nephropathy, peripheral neuropathy, PVD and retinopathy. Risk factors for coronary artery disease include diabetes mellitus, dyslipidemia, hypertension, sedentary lifestyle, obesity and post-menopausal. Current diabetic treatment includes oral agent (monotherapy) and insulin injections. She is compliant with treatment most of the time. Her weight is increasing steadily. She is following a generally unhealthy diet. When asked about meal planning, she reported none. She has not had a previous visit with a dietitian. She never participates in exercise. Her home  blood glucose trend is fluctuating minimally. Her breakfast blood glucose range is generally 110-130 mg/dl. Her bedtime blood glucose range is generally 180-200 mg/dl. (She presents today, accompanied by her husband, with her meter and logs showing stable, at target, glycemic profile overall.  Her previsit A1c was 7.5%, unchanged from previous visit.  She denies any s/s of hypoglycemia.) An ACE inhibitor/angiotensin II receptor blocker is being taken. She does not see a podiatrist.Eye exam is current.  Thyroid Problem  Presents for follow-up visit. Onset time: 15 years. Symptoms include weight gain. Patient reports no anxiety, cold intolerance, constipation, diarrhea, fatigue, heat intolerance, palpitations, tremors or visual change. The symptoms have been stable. Past treatments include levothyroxine. The following procedures have not been performed: thyroidectomy.  Hypertension This is a chronic problem. The current episode started more than 1 year ago. The problem is unchanged. The problem is controlled. Pertinent negatives include no chest pain, headaches, palpitations or shortness of breath. Agents associated with hypertension include thyroid hormones. Risk factors for coronary artery disease include diabetes mellitus, dyslipidemia, obesity, post-menopausal state and sedentary lifestyle. Past treatments include calcium channel blockers, diuretics and beta blockers. The current treatment provides mild improvement. There are no compliance problems.  Hypertensive end-organ damage includes CAD/MI, PVD and retinopathy. Identifiable causes of hypertension include chronic renal disease and a thyroid problem.    Review of systems  Constitutional: + Minimally fluctuating body weight,  current Body mass index is 28.35 kg/m. , no fatigue, no subjective hyperthermia, no subjective hypothermia Eyes: no blurry vision, no xerophthalmia ENT: no sore throat, no nodules palpated in throat, no dysphagia/odynophagia, no  hoarseness Cardiovascular: no chest pain, no shortness of breath, no palpitations, no leg swelling Respiratory: no cough, no shortness of breath Gastrointestinal: no nausea/vomiting/diarrhea Musculoskeletal: no muscle/joint aches Skin: no rashes, no hyperemia Neurological: no tremors, no numbness, no tingling, no dizziness Psychiatric: no depression, no anxiety  Objective:    BP (!) 160/76 (BP Location: Left Arm, Patient Position: Sitting)   Pulse 69   Ht 5\' 2"  (1.575 m)   Wt 155 lb (70.3 kg)   BMI 28.35 kg/m   Wt Readings from Last 3 Encounters:  06/18/20 155 lb (70.3 kg)  05/28/20 150 lb (68 kg)  02/14/20 156 lb (70.8 kg)    BP Readings from Last 3 Encounters:  06/18/20 (!) 160/76  05/28/20 (!) 144/64  02/14/20 (!) 146/66    Physical Exam- Limited  Constitutional:  Body mass index is 28.35 kg/m. , not in acute distress, normal state of mind Eyes:  EOMI, no exophthalmos Neck: Supple Cardiovascular: RRR, no murmers, rubs, or gallops, no edema Respiratory: Adequate breathing efforts, no crackles, rales, rhonchi, or wheezing Musculoskeletal: no gross deformities, strength intact in all four extremities, no gross restriction of joint movements, walks with cane due to disequilibrium Skin:  no rashes, no hyperemia Neurological: no tremor with outstretched hands   Results for orders placed or performed in visit on 05/28/20  Renal Function Panel  Result Value Ref Range   Glucose 109 (H) 65 - 99 mg/dL   BUN 26 8 - 27 mg/dL   Creatinine, Ser 07/26/20 (H) 0.57 - 1.00 mg/dL   GFR calc non Af Amer 29 (L) >59 mL/min/1.73   GFR calc Af Amer 33 (L) >59 mL/min/1.73   BUN/Creatinine Ratio 15 12 - 28   Sodium 145 (H) 134 - 144 mmol/L   Potassium 5.1 3.5 - 5.2 mmol/L   Chloride 107 (H) 96 - 106 mmol/L   CO2 23 20 - 29 mmol/L   Calcium 9.2 8.7 - 10.3 mg/dL   Phosphorus 4.1 3.0 - 4.3 mg/dL   Albumin 4.1 3.7 - 4.7 g/dL  Bayer DCA Hb 1.56 Waived  Result Value Ref Range   HB A1C (BAYER  DCA - WAIVED) 7.5 (H) <7.0 %  Microalbumin / creatinine urine ratio  Result Value Ref Range   Creatinine, Urine 51.1 Not Estab. mg/dL   Microalbumin, Urine K1Q Not Estab. ug/mL   Microalb/Creat  Ratio 318 (H) 0 - 29 mg/g creat   Diabetic Labs (most recent): Lab Results  Component Value Date   HGBA1C 7.5 (H) 05/28/2020   HGBA1C 7.5 (H) 01/30/2020   HGBA1C 8.1 (H) 09/17/2019   Lipid Panel     Component Value Date/Time   CHOL 113 09/17/2019 0955   CHOL 105 09/11/2012 1057   TRIG 84 09/17/2019 0955   TRIG 170 (H) 10/11/2014 1159   TRIG 79 09/11/2012 1057   HDL 45 09/17/2019 0955   HDL 53 10/11/2014 1159   HDL 41 09/11/2012 1057   CHOLHDL 2.5 09/17/2019 0955   LDLCALC 51 09/17/2019 0955   LDLCALC 50 02/14/2014 1012   LDLCALC 48 09/11/2012 1057     Assessment & Plan:   1) Type 2 diabetes mellitus with stage 4 chronic kidney disease, without long-term current use of insulin ([place])  - Patient has currently controlled asymptomatic type 2 DM since  75 years of age.  She presents today, accompanied by her husband, with her meter and logs showing stable, at target, glycemic profile overall.  Her previsit A1c was 7.5%, unchanged from previous visit.  She denies any s/s of hypoglycemia.   -her diabetes is complicated by PAD, CKD, neuropathy and patient remains at a high risk for more acute and chronic complications of diabetes which include CAD, CVA, CKD, retinopathy, and neuropathy. These are all discussed in detail with the patient.  - Nutritional counseling repeated at each appointment due to patients tendency to fall back in to old habits.  - The patient admits there is a room for improvement in their diet and drink choices. -  Suggestion is made for the patient to avoid simple carbohydrates from their diet including Cakes, Sweet Desserts / Pastries, Ice Cream, Soda (diet and regular), Sweet Tea, Candies, Chips, Cookies, Sweet Pastries,  Store Bought Juices, Alcohol in Excess of   1-2 drinks a day, Artificial Sweeteners, Coffee Creamer, and "Sugar-free" Products. This will help patient to have stable blood glucose profile and potentially avoid unintended weight gain.   - I encouraged the patient to switch to  unprocessed or minimally processed complex starch and increased protein intake (animal or plant source), fruits, and vegetables.   - Patient is advised to stick to a routine mealtimes to eat 3 meals  a day and avoid unnecessary snacks ( to snack only to correct hypoglycemia).  - I have approached patient with the following individualized plan to manage diabetes and patient agrees:   -Her husband, Ludwig Clarks, continues to offer to help.  -She is tolerating her current dose of Basaglar and is advised to continue 8 units SQ daily.   -She is encouraged to continue monitoring blood glucose at least twice per day, before breakfast and before bed and call the clinic if readings are less than 70 or greater than 200 for 3 tests in a row.  -Patient is not a candidate for MTF, Incretin therapy.  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    - Patient specific target  A1c;  LDL, HDL, Triglycerides,  were discussed in detail.  2) BP/HTN:  Her blood pressure is controlled to target based on her age.  She does monitor her BP at home.  She is advised to continue Norvasc 5 mg po daily, Coreg 6.25 mg po twice daily, and Lasix 40 mg twice weekly.  3) Lipids/HPL:  Her most recent lipid panel from 09/17/19 shows controlled LDL of 51.  She is advised to continue Lipitor  10 mg po daily at bedtime.  Side effects and precautions discussed with her.  Will recheck lipids prior to next visit.  4)  Weight/Diet: Her Body mass index is 28.35 kg/m.--may benefit from some minimal weight loss.  CDE Consult has been initiated, she has limited ability to exercise.  5)  Hypothyroidism:  -Her most recent TFTs are consistent with appropriate hormone replacement (not checked prior to todays visit).   She is advised to continue Levothyroxine 75 mcg po daily before breakfast.  Will repeat TFTs prior to next visit and adjust if needed.   - We discussed about the correct intake of her thyroid hormone, on empty stomach at fasting, with water, separated by at least 30 minutes from breakfast and other medications,  and separated by more than 4 hours from calcium, iron, multivitamins, acid reflux medications (PPIs). -Patient is made aware of the fact that thyroid hormone replacement is needed for life, dose to be adjusted by periodic monitoring of thyroid function tests.  5) Chronic Care/Health Maintenance: -Patient is on Statin medications and encouraged to continue to follow up with Ophthalmology, Podiatrist at least yearly or according to recommendations, and advised to   stay away from smoking. I have recommended yearly flu vaccine and pneumonia vaccination at least every 5 years;  and  sleep for at least 7 hours a day.  - I advised patient to maintain close follow up with [name], DO for primary care needs.   - Time spent on this patient care encounter:  35 min, of which > 50% was spent in  counseling and the rest reviewing her blood glucose logs , discussing her hypoglycemia and hyperglycemia episodes, reviewing her current and  previous labs / studies  ( including abstraction from other facilities) and medications  doses and developing a  long term treatment plan and documenting her care.   Please refer to Patient Instructions for Blood Glucose Monitoring and Insulin/Medications Dosing Guide"  in media tab for additional information. Please  also refer to " Patient Self Inventory" in the Media  tab for reviewed elements of pertinent patient history.  [name] participated in the discussions, expressed understanding, and voiced agreement with the above plans.  All questions were answered to her satisfaction. she is encouraged to contact clinic should she have any questions or  concerns prior to her return visit.    Follow up plan: - Return in about 4 months (around 10/16/2020) for Thyroid follow up, Diabetes follow up with A1c in office, Bring glucometer and logs.  Rayetta Pigg, [facility] [address] [place], [place] [address] Phone: [phone] Fax: [phone]  06/18/2020, 11:51 AM

## 2020-06-18 NOTE — Patient Instructions (Signed)

## 2020-07-03 ENCOUNTER — Other Ambulatory Visit: Payer: Self-pay | Admitting: "Endocrinology

## 2020-07-08 DIAGNOSIS — D631 Anemia in chronic kidney disease: Secondary | ICD-10-CM | POA: Diagnosis not present

## 2020-07-08 DIAGNOSIS — N2581 Secondary hyperparathyroidism of renal origin: Secondary | ICD-10-CM | POA: Diagnosis not present

## 2020-07-08 DIAGNOSIS — I129 Hypertensive chronic kidney disease with stage 1 through stage 4 chronic kidney disease, or unspecified chronic kidney disease: Secondary | ICD-10-CM | POA: Diagnosis not present

## 2020-07-08 DIAGNOSIS — D509 Iron deficiency anemia, unspecified: Secondary | ICD-10-CM | POA: Diagnosis not present

## 2020-07-08 DIAGNOSIS — E1122 Type 2 diabetes mellitus with diabetic chronic kidney disease: Secondary | ICD-10-CM | POA: Diagnosis not present

## 2020-07-08 DIAGNOSIS — N183 Chronic kidney disease, stage 3 unspecified: Secondary | ICD-10-CM | POA: Diagnosis not present

## 2020-07-08 DIAGNOSIS — M109 Gout, unspecified: Secondary | ICD-10-CM | POA: Diagnosis not present

## 2020-07-08 DIAGNOSIS — R809 Proteinuria, unspecified: Secondary | ICD-10-CM | POA: Diagnosis not present

## 2020-07-15 DIAGNOSIS — M79676 Pain in unspecified toe(s): Secondary | ICD-10-CM | POA: Diagnosis not present

## 2020-07-15 DIAGNOSIS — E1151 Type 2 diabetes mellitus with diabetic peripheral angiopathy without gangrene: Secondary | ICD-10-CM | POA: Diagnosis not present

## 2020-07-15 DIAGNOSIS — B351 Tinea unguium: Secondary | ICD-10-CM | POA: Diagnosis not present

## 2020-07-15 DIAGNOSIS — L84 Corns and callosities: Secondary | ICD-10-CM | POA: Diagnosis not present

## 2020-07-17 ENCOUNTER — Ambulatory Visit (INDEPENDENT_AMBULATORY_CARE_PROVIDER_SITE_OTHER): Payer: Medicare Other | Admitting: Licensed Clinical Social Worker

## 2020-07-17 DIAGNOSIS — E785 Hyperlipidemia, unspecified: Secondary | ICD-10-CM

## 2020-07-17 DIAGNOSIS — M810 Age-related osteoporosis without current pathological fracture: Secondary | ICD-10-CM | POA: Diagnosis not present

## 2020-07-17 DIAGNOSIS — E1159 Type 2 diabetes mellitus with other circulatory complications: Secondary | ICD-10-CM | POA: Diagnosis not present

## 2020-07-17 DIAGNOSIS — Z794 Long term (current) use of insulin: Secondary | ICD-10-CM

## 2020-07-17 DIAGNOSIS — E1169 Type 2 diabetes mellitus with other specified complication: Secondary | ICD-10-CM

## 2020-07-17 DIAGNOSIS — E1122 Type 2 diabetes mellitus with diabetic chronic kidney disease: Secondary | ICD-10-CM

## 2020-07-17 DIAGNOSIS — N1832 Chronic kidney disease, stage 3b: Secondary | ICD-10-CM | POA: Diagnosis not present

## 2020-07-17 DIAGNOSIS — I152 Hypertension secondary to endocrine disorders: Secondary | ICD-10-CM

## 2020-07-17 DIAGNOSIS — F411 Generalized anxiety disorder: Secondary | ICD-10-CM

## 2020-07-17 NOTE — Patient Instructions (Addendum)
Licensed Clinical Social Worker Visit Information  Goals we discussed today:   .  Client will talk with LCSW in next 30 days about management of medical needs and daily needs of client (pt-stated)        CARE PLAN ENTRY   Current Barriers:   Macular Degeneration: both eyes  Management of Diabetes is challenging  Patient with chronic diagnoses of CKD, Osteoporosis, Edema, HTN, Depression, HLD, Type 2 DM, Anxiety State  Clinical Social Work Clinical Goal(s):   LCSW will call client in next 30 days to talk with client about management of medical needs and daily needs of client  Interventions:  Talked with client about her recent phone call with Glenard Haring, [facility] [employer] representative Talked with client about possible items to help in home mentioned by Glenard Haring (talking clock, hand touch item for microwave to aid client in realizing settings on microwave) Talked with client about sleeping of client Talked with client about appetite of client Talked with client about medication procurement Talked with client about transport needs of client Talked with client about vision needs of client (she has new glasses) Talked with client about ambulation needs of client (she uses a cane to help her walk) Talked with client about ADLs completion of client Talked with client about upcoming client appointments Talked with client about support from spouse Provided counseling support for client Collaborated with [facility] regarding nursing needs of client Left message for Lytle Michaels, [facility] [employer] please call LCSW at [phone] to discuss client needs related to vision Talked with client about her recent visit to endocrinologist   Patient Self Care Activities:  Eats meals with set up assistance  Patient Self care Deficits:  Macular Degeneration: Both Eyes   Initial goal documentation     Follow Up Plan:LCSW to call client in next 4  weeks to talk with her about managing medical needs of client and to talk with her about managing daily needs of client  Materials Provided: No  The patient verbalized understanding of instructions provided today and declined a print copy of patient instruction materials.   [name].Anabeth Chilcott MSW, LCSW Licensed Clinical Social Worker [facility] [phone]

## 2020-07-17 NOTE — Chronic Care Management (AMB) (Signed)
Chronic Care Management    Clinical Social Work Follow Up Note  07/17/2020 Name: [name] MRN: [id number] DOB: [birth year]  [name] is a 75 y.o. year old female who is a primary care patient of [name], DO. The CCM team was consulted for assistance with [name] .   Review of patient status, including review of consultants reports, other relevant assessments, and collaboration with appropriate care team members and the patient's provider was performed as part of comprehensive patient evaluation and provision of chronic care management services.    SDOH (Social Determinants of Health) assessments performed: No; risk for depression; risk for tobacco use; risk for stress; risk for financial strain; risk for transport needs  [place] Office Visit from 05/28/2020 in [place]  PHQ-9 Total Score 0     GAD 7 : Generalized Anxiety Score 02/11/2020 01/30/2020  Nervous, Anxious, on Edge 1 1  Control/stop worrying 1 2  Worry too much - different things 1 2  Trouble relaxing 0 2  Restless 0 2  Easily annoyed or irritable 0 3  Afraid - awful might happen 0 1  Total GAD 7 Score 3 13  Anxiety Difficulty Somewhat difficult -    Outpatient Encounter Medications as of 07/17/2020  Medication Sig Note  . ACCU-CHEK AVIVA PLUS test strip CHECK BLOOD SUGAR UP TO 4 TIMES A DAY   . Accu-Chek Softclix Lancets lancets CHECK BLOOD SUGAR 2 TIMES A DAY   . amLODipine (NORVASC) 5 MG tablet Take 1 tablet (5 mg total) by mouth daily.   Marland Kitchen atorvastatin (LIPITOR) 10 MG tablet Take 1 tablet (10 mg total) by mouth daily.   . blood glucose meter kit and supplies Dispense based on patient and insurance preference. Use up to four times daily as directed. (FOR ICD-10 E10.9, E11.9).   . carvedilol (COREG) 6.25 MG tablet Take 1 tablet (6.25 mg total) by mouth 2 (two) times daily with a meal.   . Cholecalciferol 25 MCG (1000 UT) capsule Take 1 capsule (1,000 Units  total) by mouth daily.   . colchicine 0.6 MG tablet Take 0.6 mg by mouth daily as needed.   . fluticasone (FLONASE) 50 MCG/ACT nasal spray Use 2 sprays in each nostril daily 05/01/2019: Reports taking as needed  . furosemide (LASIX) 40 MG tablet TAKE 1 TABLET TWICE WEEKLY   . Insulin Glargine (BASAGLAR KWIKPEN) 100 UNIT/ML Inject 8 Units into the skin daily.   Marland Kitchen ketorolac (ACULAR) 0.5 % ophthalmic solution Place 1 drop into the right eye every morning.   . Lancets Misc. MISC Use as directed to check blood sugar up to 4x daily. E11.65   . levothyroxine (SYNTHROID) 75 MCG tablet TAKE 1 TABLET DAILY BEFORE BREAKFAST   . pantoprazole (PROTONIX) 20 MG tablet Take 1 tablet (20 mg total) by mouth daily.   . prednisoLONE acetate (PRED FORTE) 1 % ophthalmic suspension Place 1 drop into the right eye 4 (four) times daily.   Marland Kitchen ULTICARE MICRO PEN NEEDLES 32G X 4 MM MISC TWICE DAILY    No facility-administered encounter medications on file as of 07/17/2020.    Goals    .  Client will talk with LCSW in next 30 days about management of medical needs and daily needs of client (pt-stated)      CARE PLAN ENTRY   Current Barriers:  Marland Kitchen Macular Degeneration: both eyes . Management of Diabetes is challenging . Patient with chronic diagnoses of CKD, Osteoporosis, Edema, HTN,  Depression, HLD, Type 2 DM, Anxiety State  Clinical Social Work Clinical Goal(s):  Marland Kitchen LCSW will call client in next 30 days to talk with client about management of medical needs and daily needs of client  Interventions:  Talked with client about her recent phone call with Glenard Haring, [facility] representative Talked with client about possible items to help in home mentioned by Glenard Haring (talking clock, hand touch item for microwave to aid client in realizing settings on microwave) Talked with client about sleeping of client Talked with client about appetite of client Talked with client about medication procurement Talked with  client about transport needs of client Talked with client about vision needs of client (she has new glasses) Talked with client about ambulation needs of client (she uses a cane to help her walk) Talked with client about ADLs completion of client Talked with client about upcoming client appointments Talked with client about support from spouse Provided counseling support for client Collaborated with [facility] regarding nursing needs of client Left message for Lytle Michaels, [facility] [employer] please call LCSW at [phone] to discuss client needs related to vision Talked with client about her recent visit to endocrinologist   Patient Self Care Activities:  Eats meals with set up assistance  Patient Self care Deficits:  Macular Degeneration: Both Eyes   Initial goal documentation     Follow Up Plan:LCSW to call client in next 4 weeks to talk with her about managing medical needs of client and to talk with her about managing daily needs of client  [name]. MSW, LCSW Licensed Clinical Social Worker [facility] Care Management [phone]

## 2020-07-24 ENCOUNTER — Other Ambulatory Visit: Payer: Self-pay | Admitting: Family Medicine

## 2020-08-06 ENCOUNTER — Other Ambulatory Visit: Payer: Self-pay | Admitting: Family Medicine

## 2020-08-06 DIAGNOSIS — E1159 Type 2 diabetes mellitus with other circulatory complications: Secondary | ICD-10-CM

## 2020-08-14 DIAGNOSIS — Z79899 Other long term (current) drug therapy: Secondary | ICD-10-CM | POA: Diagnosis not present

## 2020-08-14 DIAGNOSIS — M2550 Pain in unspecified joint: Secondary | ICD-10-CM | POA: Diagnosis not present

## 2020-08-14 DIAGNOSIS — Z6827 Body mass index (BMI) 27.0-27.9, adult: Secondary | ICD-10-CM | POA: Diagnosis not present

## 2020-08-14 DIAGNOSIS — E663 Overweight: Secondary | ICD-10-CM | POA: Diagnosis not present

## 2020-08-14 DIAGNOSIS — M1A09X1 Idiopathic chronic gout, multiple sites, with tophus (tophi): Secondary | ICD-10-CM | POA: Diagnosis not present

## 2020-08-18 ENCOUNTER — Ambulatory Visit (INDEPENDENT_AMBULATORY_CARE_PROVIDER_SITE_OTHER): Payer: Medicare Other | Admitting: Licensed Clinical Social Worker

## 2020-08-18 DIAGNOSIS — Z794 Long term (current) use of insulin: Secondary | ICD-10-CM

## 2020-08-18 DIAGNOSIS — D631 Anemia in chronic kidney disease: Secondary | ICD-10-CM

## 2020-08-18 DIAGNOSIS — E113553 Type 2 diabetes mellitus with stable proliferative diabetic retinopathy, bilateral: Secondary | ICD-10-CM | POA: Diagnosis not present

## 2020-08-18 DIAGNOSIS — N189 Chronic kidney disease, unspecified: Secondary | ICD-10-CM

## 2020-08-18 DIAGNOSIS — I152 Hypertension secondary to endocrine disorders: Secondary | ICD-10-CM | POA: Diagnosis not present

## 2020-08-18 DIAGNOSIS — E1159 Type 2 diabetes mellitus with other circulatory complications: Secondary | ICD-10-CM

## 2020-08-18 DIAGNOSIS — E1169 Type 2 diabetes mellitus with other specified complication: Secondary | ICD-10-CM | POA: Diagnosis not present

## 2020-08-18 DIAGNOSIS — M810 Age-related osteoporosis without current pathological fracture: Secondary | ICD-10-CM

## 2020-08-18 DIAGNOSIS — E785 Hyperlipidemia, unspecified: Secondary | ICD-10-CM

## 2020-08-18 DIAGNOSIS — H353134 Nonexudative age-related macular degeneration, bilateral, advanced atrophic with subfoveal involvement: Secondary | ICD-10-CM

## 2020-08-18 DIAGNOSIS — F411 Generalized anxiety disorder: Secondary | ICD-10-CM

## 2020-08-18 NOTE — Chronic Care Management (AMB) (Signed)
Chronic Care Management    Clinical Social Work Note  08/18/2020 Name: [name] MRN: [id number] DOB: 06-Dec-1945  [name] is a 75 y.o. year old female who is a primary care patient of [name], DO. The CCM team was consulted to assist the patient with chronic disease management and/or care coordination needs related to: [name] .   Engaged with patient by telephone for follow up visit in response to provider referral for social work chronic care management and care coordination services.   Consent to Services:  The patient was given information about Chronic Care Management services, agreed to services, and gave verbal consent prior to initiation of services.  Please see initial visit note for detailed documentation.   Patient agreed to services and consent obtained.   Assessment: Review of patient past medical history, allergies, medications, and health status, including review of relevant consultants reports was performed today as part of a comprehensive evaluation and provision of chronic care management and care coordination services.     SDOH (Social Determinants of Health) assessments and interventions performed:    Advanced Directives Status: See [employer]yn[employer] application for related entries.  CCM Care Plan  Allergies  Allergen Reactions  . Aspirin Nausea And Vomiting  . Ciprofloxacin Other (See Comments)    Upset Stomach  . Codeine Nausea And Vomiting    Makes me sick   . Losartan     Hyperkalemia   . Micardis [Telmisartan] Other (See Comments)    unknown  . Nexium [Esomeprazole Magnesium] Other (See Comments)    Causes internal bleeding  . Niaspan [Niacin Er] Other (See Comments)    Reaction is unknown  . Onglyza [Saxagliptin] Other (See Comments)    Reaction is unknown  . Other     No otc pain medications  . Rofecoxib Other (See Comments)    unknown  . Simvastatin   . Tequin [Gatifloxacin] Other (See Comments)    Reaction is  unknown  . Welchol [Colesevelam Hcl]   . Amoxicillin Nausea And Vomiting and Rash    Has patient had a PCN reaction causing immediate rash, facial/tongue/throat swelling, SOB or lightheadedness with hypotension: Yes Has patient had a PCN reaction causing severe rash involving mucus membranes or skin necrosis: no  Has patient had a PCN reaction that required hospitalization No Has patient had a PCN reaction occurring within the last 10 years: No If all of the above answers are "NO", then may proceed with Cephalosporin use.     Outpatient Encounter Medications as of 08/18/2020  Medication Sig Note  . ACCU-CHEK AVIVA PLUS test strip CHECK BLOOD SUGAR UP TO 4 TIMES A DAY   . Accu-Chek Softclix Lancets lancets CHECK BLOOD SUGAR 2 TIMES A DAY   . amLODipine (NORVASC) 5 MG tablet TAKE 1 TABLET EVERY DAY   . atorvastatin (LIPITOR) 10 MG tablet Take 1 tablet (10 mg total) by mouth daily.   . blood glucose meter kit and supplies Dispense based on patient and insurance preference. Use up to four times daily as directed. (FOR ICD-10 E10.9, E11.9).   . carvedilol (COREG) 6.25 MG tablet Take 1 tablet (6.25 mg total) by mouth 2 (two) times daily with a meal.   . Cholecalciferol 25 MCG (1000 UT) capsule Take 1 capsule (1,000 Units total) by mouth daily.   . colchicine 0.6 MG tablet Take 0.6 mg by mouth daily as needed.   . fluticasone (FLONASE) 50 MCG/ACT nasal spray Use 2 sprays in each nostril  daily 05/01/2019: Reports taking as needed  . furosemide (LASIX) 40 MG tablet TAKE 1 TABLET TWICE WEEKLY   . Insulin Glargine (BASAGLAR KWIKPEN) 100 UNIT/ML Inject 8 Units into the skin daily.   Marland Kitchen ketorolac (ACULAR) 0.5 % ophthalmic solution Place 1 drop into the right eye every morning.   . Lancets Misc. MISC Use as directed to check blood sugar up to 4x daily. E11.65   . levothyroxine (SYNTHROID) 75 MCG tablet TAKE 1 TABLET DAILY BEFORE BREAKFAST   . pantoprazole (PROTONIX) 20 MG tablet Take 1 tablet (20 mg  total) by mouth daily.   . prednisoLONE acetate (PRED FORTE) 1 % ophthalmic suspension Place 1 drop into the right eye 4 (four) times daily.   Marland Kitchen ULTICARE MICRO PEN NEEDLES 32G X 4 MM MISC TWICE DAILY    No facility-administered encounter medications on file as of 08/18/2020.    Patient Active Problem List   Diagnosis Date Noted  . Cystoid macular edema, right eye 01/14/2020  . Follow-up examination after eye surgery 11/01/2019  . Controlled type 2 diabetes mellitus with stable proliferative retinopathy of both eyes, with long-term current use of insulin ([place]) 10/02/2019  . 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 Retinal hemorrhage of left eye 10/02/2019  . Retinal microaneurysm of both eyes 10/02/2019  . Dislocated intraocular lens, initial encounter 10/02/2019  . Vitamin D deficiency 11/15/2017  . Foot ulcer ([place]) 06/02/2016  . 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 Cellulitis 05/05/2016  . Diabetic foot infection ([place]) 05/05/2016  . Nausea and vomiting 01/23/2015  . CKD (chronic kidney disease), stage III ([place])   . Anemia in chronic renal disease   . Gastroesophageal reflux disease without esophagitis 10/11/2014  . Hypothyroidism 05/18/2014  . Hip fracture requiring operative repair ([place])   . Fall   . Osteoporosis 09/06/2013  . Type 2 diabetes mellitus with stage 3b chronic kidney disease, with long-term current use of insulin ([place]) 07/20/2013  . Edema 03/03/2011  . Mixed hyperlipidemia   . Diabetic retinopathy ([place]) 10/28/2007  . Hyperlipidemia associated with type 2 diabetes mellitus ([place]) 10/28/2007  . Anxiety state 10/28/2007  . Depression 10/28/2007  . Essential hypertension 10/28/2007  . Disorder resulting from impaired renal function 10/28/2007  . COLONIC POLYPS, HYPERPLASTIC 11/17/2006  . DIVERTICULOSIS, COLON 11/17/2006    Conditions to be  addressed/monitored: ; Monitor health needs of client; monitor client needs related to ADLs completion daily  Care Plan : [place]  Updates made by Katha Cabal, LCSW since 08/18/2020 12:00 AM    Problem: Coping Skills (General Plan of Care)     Goal: Manage medical needs and complete ADLs daily   Start Date: 08/18/2020  Expected End Date: 11/18/2020  This Visit's Progress: On track  Priority: Medium  Note:   Current barriers:   . Patient in need of assistance with connecting to community resources for assisting with vision needs and helping client complete ADLs . Patient is unable to independently navigate community resource options without care coordination support . Legally Blind . Mobility issues  Clinical Goals:  LCSW to communicate with client in next 30 days to discuss client management of medical needs and to discuss client completions of daily ADLs Client to attend scheduled medical appointments in next 30 days Client to communicate with RNCM or LCSW as needed in next 30 days  Clinical Interventions:  . Collaboration with [name], DO  regarding development and update of comprehensive plan of care as evidenced by provider attestation and co-signature . Assessment of needs, barriers of client . Talked with client about mobility of client (uses a cane to help her walk) . Talked with client about pain issues of client . Talked with client about support from [facility] (client said she received a watch, two magnifiers, flashlight, ink pens, markers for microwave, writing tablet). Client has talked with Glenard Haring, [facility] representative . Talked with client about medications of client . Talked with client about sleeping issues of client . Talked with client about ADLs completion . Talked with client about medical needs management.  (Spouse is involved and helps client with daily needs) . Talked with client about food procurement of  client  . Talked with client about transport support for client  . Talked with client about relaxation activities (likes to watch TV, listen to music)   Patient Activities  Listens to music Talks with family and friends via phone Communicates regularly with her spouse, [name]  Patient Coping Strengths: Takes medications as prescribed Attends scheduled medical appointments Has transport support from spouse, [name]  Patient Deficits:   Legally blind Mobility issues  Patient Goals:  Client to communicate with RNCM or LCSW as needed in next 30 days for CCM support Client to manage health needs faced and complete ADLs daily for next 30 days Communicate daily with spouse to discuss client needs -  Follow Up Plan:  LCSW  to call client on 09/22/20    [name].Brysyn Brandenberger MSW, LCSW Licensed Clinical Social Worker [facility] Care Management [phone]

## 2020-08-18 NOTE — Patient Instructions (Signed)
Visit Information  PATIENT GOALS: Goals Addressed              This Visit's Progress   .  Manage health needs faced; complete ADLs daily (pt-stated)        Timeframe:  Short-Term Goal Priority:  Medium Start Date:        08/18/20                     Expected End Date:                      11/18/20  Follow Up Date  09/22/20   Protect My Health (Patient)   Manage Health needs and complete ADLs daily    Why is this important?    Screening tests can find diseases early when they are easier to treat.   Your doctor or nurse will talk with you about which tests are important for you.   Getting shots for common diseases like the flu and shingles will help prevent them.    Patient Activities  Listens to music Talks with family and friends via phone Communicates regularly with her spouse, [name]  Patient Coping Strengths: Takes medications as prescribed Attends scheduled medical appointments Has transport support from spouse, [name]  Patient Deficits:   Legally blind Mobility issues  Patient Goals:  Client to communicate with RNCM or LCSW as needed in next 30 days for CCM support Client to manage health needs faced and complete ADLs daily for next 30 days Communicate daily with spouse to discuss client needs -  Follow Up Plan:  LCSW  to call client on 09/22/20      [name].Tierra Divelbiss MSW, LCSW Licensed Clinical Social Worker [facility] Care Management [phone]

## 2020-09-22 ENCOUNTER — Ambulatory Visit (INDEPENDENT_AMBULATORY_CARE_PROVIDER_SITE_OTHER): Payer: Medicare Other | Admitting: Licensed Clinical Social Worker

## 2020-09-22 DIAGNOSIS — Z794 Long term (current) use of insulin: Secondary | ICD-10-CM

## 2020-09-22 DIAGNOSIS — D631 Anemia in chronic kidney disease: Secondary | ICD-10-CM

## 2020-09-22 DIAGNOSIS — E1169 Type 2 diabetes mellitus with other specified complication: Secondary | ICD-10-CM

## 2020-09-22 DIAGNOSIS — H353134 Nonexudative age-related macular degeneration, bilateral, advanced atrophic with subfoveal involvement: Secondary | ICD-10-CM

## 2020-09-22 DIAGNOSIS — E785 Hyperlipidemia, unspecified: Secondary | ICD-10-CM

## 2020-09-22 DIAGNOSIS — I152 Hypertension secondary to endocrine disorders: Secondary | ICD-10-CM

## 2020-09-22 DIAGNOSIS — F411 Generalized anxiety disorder: Secondary | ICD-10-CM

## 2020-09-22 DIAGNOSIS — M810 Age-related osteoporosis without current pathological fracture: Secondary | ICD-10-CM

## 2020-09-22 DIAGNOSIS — E1159 Type 2 diabetes mellitus with other circulatory complications: Secondary | ICD-10-CM

## 2020-09-22 NOTE — Patient Instructions (Signed)
Visit Information  PATIENT GOALS: Goals Addressed              This Visit's Progress   .  Manage health needs faced; complete ADLs daily (pt-stated)        Timeframe:  Short-Term Goal Priority:  Medium Progress: On Track Start Date:        08/18/20                     Expected End Date:                      11/18/20  Follow Up Date  10/30/20   Protect My Health (Patient)   Manage Health needs and complete ADLs daily    Why is this important?    Screening tests can find diseases early when they are easier to treat.   Your doctor or nurse will talk with you about which tests are important for you.   Getting shots for common diseases like the flu and shingles will help prevent them.    Patient Activities  Listens to music Talks with family and friends via phone Communicates regularly with her spouse, [name]  Patient Coping Strengths: Takes medications as prescribed Attends scheduled medical appointments Has transport support from spouse, [name]  Patient Deficits:   Legally blind Mobility issues  Patient Goals:  Client to communicate with RNCM or LCSW as needed in next 30 days for CCM support Client to manage health needs faced and complete ADLs daily for next 30 days Communicate daily with spouse to discuss client needs -  Follow Up Plan:  LCSW  to call client on 10/30/20      [name].Maguire Sime MSW, LCSW Licensed Clinical Social Worker [facility] Care Management [phone]

## 2020-09-22 NOTE — Chronic Care Management (AMB) (Signed)
Chronic Care Management    Clinical Social Work Note  09/22/2020 Name: [name] MRN: [id number] DOB: [birth year]  [name] is a 75 y.o. year old female who is a primary care patient of [name], DO. The CCM team was consulted to assist the patient with chronic disease management and/or care coordination needs related to: [name] .   Engaged with patient by telephone for follow up visit in response to provider referral for social work chronic care management and care coordination services.   Consent to Services:  The patient was given information about Chronic Care Management services, agreed to services, and gave verbal consent prior to initiation of services.  Please see initial visit note for detailed documentation.   Patient agreed to services and consent obtained.   Assessment: Review of patient past medical history, allergies, medications, and health status, including review of relevant consultants reports was performed today as part of a comprehensive evaluation and provision of chronic care management and care coordination services.     SDOH (Social Determinants of Health) assessments and interventions performed:  SDOH Interventions   Flowsheet Row Most Recent Value  SDOH Interventions   Depression Interventions/Treatment  --  [informed client of LCSW support and of RNCM support]       Advanced Directives Status: See [employer]yn[employer] application for related entries.  CCM Care Plan  Allergies  Allergen Reactions  . Aspirin Nausea And Vomiting  . Ciprofloxacin Other (See Comments)    Upset Stomach  . Codeine Nausea And Vomiting    Makes me sick   . Losartan     Hyperkalemia   . Micardis [Telmisartan] Other (See Comments)    unknown  . Nexium [Esomeprazole Magnesium] Other (See Comments)    Causes internal bleeding  . Niaspan [Niacin Er] Other (See Comments)    Reaction is unknown  . Onglyza [Saxagliptin] Other (See Comments)    Reaction is  unknown  . Other     No otc pain medications  . Rofecoxib Other (See Comments)    unknown  . Simvastatin   . Tequin [Gatifloxacin] Other (See Comments)    Reaction is unknown  . Welchol [Colesevelam Hcl]   . Amoxicillin Nausea And Vomiting and Rash    Has patient had a PCN reaction causing immediate rash, facial/tongue/throat swelling, SOB or lightheadedness with hypotension: Yes Has patient had a PCN reaction causing severe rash involving mucus membranes or skin necrosis: no  Has patient had a PCN reaction that required hospitalization No Has patient had a PCN reaction occurring within the last 10 years: No If all of the above answers are "NO", then may proceed with Cephalosporin use.     Outpatient Encounter Medications as of 09/22/2020  Medication Sig Note  . ACCU-CHEK AVIVA PLUS test strip CHECK BLOOD SUGAR UP TO 4 TIMES A DAY   . Accu-Chek Softclix Lancets lancets CHECK BLOOD SUGAR 2 TIMES A DAY   . amLODipine (NORVASC) 5 MG tablet TAKE 1 TABLET EVERY DAY   . atorvastatin (LIPITOR) 10 MG tablet Take 1 tablet (10 mg total) by mouth daily.   . blood glucose meter kit and supplies Dispense based on patient and insurance preference. Use up to four times daily as directed. (FOR ICD-10 E10.9, E11.9).   . carvedilol (COREG) 6.25 MG tablet Take 1 tablet (6.25 mg total) by mouth 2 (two) times daily with a meal.   . Cholecalciferol 25 MCG (1000 UT) capsule Take 1 capsule (1,000 Units total) by  mouth daily.   . colchicine 0.6 MG tablet Take 0.6 mg by mouth daily as needed.   . fluticasone (FLONASE) 50 MCG/ACT nasal spray Use 2 sprays in each nostril daily 05/01/2019: Reports taking as needed  . furosemide (LASIX) 40 MG tablet TAKE 1 TABLET TWICE WEEKLY   . Insulin Glargine (BASAGLAR KWIKPEN) 100 UNIT/ML Inject 8 Units into the skin daily.   Marland Kitchen ketorolac (ACULAR) 0.5 % ophthalmic solution Place 1 drop into the right eye every morning.   . Lancets Misc. MISC Use as directed to check blood sugar  up to 4x daily. E11.65   . levothyroxine (SYNTHROID) 75 MCG tablet TAKE 1 TABLET DAILY BEFORE BREAKFAST   . pantoprazole (PROTONIX) 20 MG tablet Take 1 tablet (20 mg total) by mouth daily.   . prednisoLONE acetate (PRED FORTE) 1 % ophthalmic suspension Place 1 drop into the right eye 4 (four) times daily.   Marland Kitchen ULTICARE MICRO PEN NEEDLES 32G X 4 MM MISC TWICE DAILY    No facility-administered encounter medications on file as of 09/22/2020.    Patient Active Problem List   Diagnosis Date Noted  . Cystoid macular edema, right eye 01/14/2020  . Follow-up examination after eye surgery 11/01/2019  . Controlled type 2 diabetes mellitus with stable proliferative retinopathy of both eyes, with long-term current use of insulin ([place]) 10/02/2019  . 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 Retinal hemorrhage of left eye 10/02/2019  . Retinal microaneurysm of both eyes 10/02/2019  . Dislocated intraocular lens, initial encounter 10/02/2019  . Vitamin D deficiency 11/15/2017  . Foot ulcer ([place]) 06/02/2016  . 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 Cellulitis 05/05/2016  . Diabetic foot infection ([place]) 05/05/2016  . Nausea and vomiting 01/23/2015  . CKD (chronic kidney disease), stage III ([place])   . Anemia in chronic renal disease   . Gastroesophageal reflux disease without esophagitis 10/11/2014  . Hypothyroidism 05/18/2014  . Hip fracture requiring operative repair ([place])   . Fall   . Osteoporosis 09/06/2013  . Type 2 diabetes mellitus with stage 3b chronic kidney disease, with long-term current use of insulin ([place]) 07/20/2013  . Edema 03/03/2011  . Mixed hyperlipidemia   . Diabetic retinopathy ([place]) 10/28/2007  . Hyperlipidemia associated with type 2 diabetes mellitus ([place]) 10/28/2007  . Anxiety state 10/28/2007  . Depression 10/28/2007  . Essential hypertension 10/28/2007  . Disorder  resulting from impaired renal function 10/28/2007  . COLONIC POLYPS, HYPERPLASTIC 11/17/2006  . DIVERTICULOSIS, COLON 11/17/2006    Conditions to be addressed/monitored: Monitor client completion of ADLs; monitor client management of her health needs   Care Plan : LCSW Care Plan  Updates made by Katha Cabal, LCSW since 09/22/2020 12:00 AM    Problem: Coping Skills (General Plan of Care)     Goal: Manage medical needs and complete ADLs daily   Start Date: 08/18/2020  Expected End Date: 11/18/2020  This Visit's Progress: On track  Recent Progress: On track  Priority: Medium  Note:   Current barriers:   . Patient in need of assistance with connecting to community resources for assisting with vision needs and helping client complete ADLs . Patient is unable to independently navigate community resource options without care coordination support . Legally Blind . Mobility issues  Clinical Goals:  LCSW to communicate with client in next 30 days to discuss client management of medical needs and to discuss client completion of  daily ADLs Client to attend scheduled medical appointments in next 30 days Client to communicate with RNCM or LCSW as needed in next 30 days  Clinical Interventions:  . Collaboration with [name], DO regarding development and update of comprehensive plan of care as evidenced by provider attestation and co-signature . Assessment of needs, barriers of client . Talked with client about mobility of client (uses a cane to help her walk) . Talked with client about pain issues of client . Talked with client about support from [facility] (client said she received a watch, two magnifiers, flashlight, ink pens, markers for microwave, writing tablet). Client has talked with Glenard Haring, [facility] representative . Talked with client about medications of client . Talked with client about sleeping issues of client . Talked with client about  ADLs completion of client . Talked with client about medical needs management.  (Spouse is involved and helps client with daily needs) . Talked with client about food procurement of client (her spouse cooks daily for client and spouse at their home) . Talked with client about transport support for client  . Talked with client about relaxation activities (likes to watch TV, listen to music) . Talked with client about upcoming client medical appointments . Talked with client about her management of diabetes . Talked with client about mood of client  Patient Activities  Listens to music Talks with family and friends via phone Communicates regularly with her spouse, [name]  Patient Coping Strengths: Takes medications as prescribed Attends scheduled medical appointments Has transport support from spouse, [name]  Patient Deficits:   Legally blind Mobility issues  Patient Goals:  Client to communicate with RNCM or LCSW as needed in next 30 days for CCM support Client to manage health needs faced and complete ADLs daily for next 30 days Communicate daily with spouse to discuss client needs -  Follow Up Plan:  LCSW  to call client on 10/30/20    [name].Rodrigo Mcgranahan MSW, LCSW Licensed Clinical Social Worker [facility] Care Management [phone]

## 2020-09-23 ENCOUNTER — Telehealth: Payer: Medicare Other

## 2020-09-29 ENCOUNTER — Encounter: Payer: Self-pay | Admitting: *Deleted

## 2020-09-29 ENCOUNTER — Ambulatory Visit: Payer: Medicare Other | Admitting: *Deleted

## 2020-09-29 DIAGNOSIS — E1169 Type 2 diabetes mellitus with other specified complication: Secondary | ICD-10-CM | POA: Diagnosis not present

## 2020-09-29 DIAGNOSIS — E113499 Type 2 diabetes mellitus with severe nonproliferative diabetic retinopathy without macular edema, unspecified eye: Secondary | ICD-10-CM

## 2020-09-29 DIAGNOSIS — E785 Hyperlipidemia, unspecified: Secondary | ICD-10-CM | POA: Diagnosis not present

## 2020-09-29 DIAGNOSIS — Z794 Long term (current) use of insulin: Secondary | ICD-10-CM | POA: Diagnosis not present

## 2020-09-29 DIAGNOSIS — M810 Age-related osteoporosis without current pathological fracture: Secondary | ICD-10-CM | POA: Diagnosis not present

## 2020-09-29 DIAGNOSIS — F32A Depression, unspecified: Secondary | ICD-10-CM | POA: Diagnosis not present

## 2020-09-29 DIAGNOSIS — N1832 Chronic kidney disease, stage 3b: Secondary | ICD-10-CM | POA: Diagnosis not present

## 2020-09-29 DIAGNOSIS — E1122 Type 2 diabetes mellitus with diabetic chronic kidney disease: Secondary | ICD-10-CM

## 2020-09-29 DIAGNOSIS — N189 Chronic kidney disease, unspecified: Secondary | ICD-10-CM | POA: Diagnosis not present

## 2020-09-29 DIAGNOSIS — N183 Chronic kidney disease, stage 3 unspecified: Secondary | ICD-10-CM | POA: Diagnosis not present

## 2020-09-29 DIAGNOSIS — D631 Anemia in chronic kidney disease: Secondary | ICD-10-CM | POA: Diagnosis not present

## 2020-09-29 DIAGNOSIS — E1159 Type 2 diabetes mellitus with other circulatory complications: Secondary | ICD-10-CM | POA: Diagnosis not present

## 2020-09-29 NOTE — Chronic Care Management (AMB) (Signed)
Chronic Care Management   CCM RN Visit Note  09/29/2020 Name: [name] MRN: [id number] DOB: Aug 16, 1945  Subjective: [name] is a 75 y.o. year old female who is a primary care patient of [name], DO. The care management team was consulted for assistance with disease management and care coordination needs.    Engaged with patient by telephone for follow up visit in response to provider referral for case management and/or care coordination services.   Consent to Services:  The patient was given information about Chronic Care Management services, agreed to services, and gave verbal consent prior to initiation of services.  Please see initial visit note for detailed documentation.   Patient agreed to services and verbal consent obtained.   Assessment: Review of patient past medical history, allergies, medications, health status, including review of consultants reports, laboratory and other test data, was performed as part of comprehensive evaluation and provision of chronic care management services.   SDOH (Social Determinants of Health) assessments and interventions performed:    CCM Care Plan  Allergies  Allergen Reactions  . Aspirin Nausea And Vomiting  . Ciprofloxacin Other (See Comments)    Upset Stomach  . Codeine Nausea And Vomiting    Makes me sick   . Losartan     Hyperkalemia   . Micardis [Telmisartan] Other (See Comments)    unknown  . Nexium [Esomeprazole Magnesium] Other (See Comments)    Causes internal bleeding  . Niaspan [Niacin Er] Other (See Comments)    Reaction is unknown  . Onglyza [Saxagliptin] Other (See Comments)    Reaction is unknown  . Other     No otc pain medications  . Rofecoxib Other (See Comments)    unknown  . Simvastatin   . Tequin [Gatifloxacin] Other (See Comments)    Reaction is unknown  . Welchol [Colesevelam Hcl]   . Amoxicillin Nausea And Vomiting and Rash    Has patient had a PCN reaction causing immediate  rash, facial/tongue/throat swelling, SOB or lightheadedness with hypotension: Yes Has patient had a PCN reaction causing severe rash involving mucus membranes or skin necrosis: no  Has patient had a PCN reaction that required hospitalization No Has patient had a PCN reaction occurring within the last 10 years: No If all of the above answers are "NO", then may proceed with Cephalosporin use.     Outpatient Encounter Medications as of 09/29/2020  Medication Sig Note  . ACCU-CHEK AVIVA PLUS test strip CHECK BLOOD SUGAR UP TO 4 TIMES A DAY   . Accu-Chek Softclix Lancets lancets CHECK BLOOD SUGAR 2 TIMES A DAY   . amLODipine (NORVASC) 5 MG tablet TAKE 1 TABLET EVERY DAY   . atorvastatin (LIPITOR) 10 MG tablet Take 1 tablet (10 mg total) by mouth daily.   . blood glucose meter kit and supplies Dispense based on patient and insurance preference. Use up to four times daily as directed. (FOR ICD-10 E10.9, E11.9).   . carvedilol (COREG) 6.25 MG tablet Take 1 tablet (6.25 mg total) by mouth 2 (two) times daily with a meal.   . Cholecalciferol 25 MCG (1000 UT) capsule Take 1 capsule (1,000 Units total) by mouth daily.   . colchicine 0.6 MG tablet Take 0.6 mg by mouth daily as needed.   . fluticasone (FLONASE) 50 MCG/ACT nasal spray Use 2 sprays in each nostril daily 05/01/2019: Reports taking as needed  . furosemide (LASIX) 40 MG tablet TAKE 1 TABLET TWICE WEEKLY   . Insulin  Glargine (BASAGLAR KWIKPEN) 100 UNIT/ML Inject 8 Units into the skin daily.   Marland Kitchen ketorolac (ACULAR) 0.5 % ophthalmic solution Place 1 drop into the right eye every morning.   . Lancets Misc. MISC Use as directed to check blood sugar up to 4x daily. E11.65   . levothyroxine (SYNTHROID) 75 MCG tablet TAKE 1 TABLET DAILY BEFORE BREAKFAST   . pantoprazole (PROTONIX) 20 MG tablet Take 1 tablet (20 mg total) by mouth daily.   . prednisoLONE acetate (PRED FORTE) 1 % ophthalmic suspension Place 1 drop into the right eye 4 (four) times daily.    Marland Kitchen ULTICARE MICRO PEN NEEDLES 32G X 4 MM MISC TWICE DAILY    No facility-administered encounter medications on file as of 09/29/2020.    Patient Active Problem List   Diagnosis Date Noted  . Cystoid macular edema, right eye 01/14/2020  . Follow-up examination after eye surgery 11/01/2019  . Controlled type 2 diabetes mellitus with stable proliferative retinopathy of both eyes, with long-term current use of insulin ([place]) 10/02/2019  . 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 Retinal hemorrhage of left eye 10/02/2019  . Retinal microaneurysm of both eyes 10/02/2019  . Dislocated intraocular lens, initial encounter 10/02/2019  . Vitamin D deficiency 11/15/2017  . Foot ulcer ([place]) 06/02/2016  . 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 Cellulitis 05/05/2016  . Diabetic foot infection ([place]) 05/05/2016  . Nausea and vomiting 01/23/2015  . CKD (chronic kidney disease), stage III ([place])   . Anemia in chronic renal disease   . Gastroesophageal reflux disease without esophagitis 10/11/2014  . Hypothyroidism 05/18/2014  . Hip fracture requiring operative repair ([place])   . Fall   . Osteoporosis 09/06/2013  . Type 2 diabetes mellitus with stage 3b chronic kidney disease, with long-term current use of insulin ([place]) 07/20/2013  . Edema 03/03/2011  . Mixed hyperlipidemia   . Diabetic retinopathy ([place]) 10/28/2007  . Hyperlipidemia associated with type 2 diabetes mellitus ([place]) 10/28/2007  . Anxiety state 10/28/2007  . Depression 10/28/2007  . Essential hypertension 10/28/2007  . Disorder resulting from impaired renal function 10/28/2007  . COLONIC POLYPS, HYPERPLASTIC 11/17/2006  . DIVERTICULOSIS, COLON 11/17/2006    Conditions to be addressed/monitored:DMII, CKD Stage III and limited vision  Care Plan : RNCM: Diabetes Type 2 (Adult)  Updates made by Ilean China, RN since 09/29/2020 12:00  AM    Problem: Glycemic Management (Diabetes, Type 2)   Priority: Medium    Long-Range Goal: Diabetes Management Optimized   Start Date: 09/29/2020  This Visit's Progress: On track  Priority: Medium  Note:   Current Barriers:  . 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  . Limited vision . Unable to independently read, drive, administer medications because of her limited vision . Other chronic conditions: depression, CKD  Nurse Case Manager Clinical Goal(s):  . patient will work with PCP/endocrinologist to address needs related to medical management of diabetes . patient will meet with RN Care Manager to address self-management of diabetes  Interventions:  . 1:1 collaboration with Claretta Fraise, MD regarding development and update of comprehensive plan of care as evidenced by provider attestation and co-signature . Inter-disciplinary care team collaboration (see longitudinal plan of care) . Evaluation of current treatment plan related to diabetes and patient's adherence to plan as established by provider. . Chart reviewed including relevant office notes and lab results . Consulted  by LCSW regarding patient's limited vision as a barrier to blood sugar testing and insulin administration . Talked with patient about family/social support o Her husband assists with insulin administration and blood sugar testing. He is in poor health and she is concerned about her ability to care for herself if he isn't able to assist. . Reviewed and discussed medications o Basaglar 8 units at bedtime . Discussed potential options for blood sugar testing o Dexcom is compatable with iPhone 6 or newer. [employer]chet can be setup to verbally read out blood sugar results and trend. - May not qualify because she doesn't inject insulin 3 or more times a day - iPhone 6 isn't available for purchase in stores. Would have to buy used or refurbished online. Does not  have anyone that can assist with this. Cannot afford a newer phone that would be available for purchase in stores. - Would also need wifi internet or data package for app to work  - May qualify for a free government phone and monthly data package if she qualifies for another government program like medicaid or [employer], but husband does not want to apply for government assistance.  o Glucometer, like [employer], that will verbalize blood sugar results. Indicated for diabetic patients that are blind.  - Unsure of insurance coverage. May need letter of medical necessity.  - Would still need to do finger sticks . Collaborated with PCP and PharmD regarding other medication options that would be easier for her to administer on her own. Will discuss further.  . Reviewed upcoming appointments: Dr Lajuana Ripple on 10/01/20 . Reinforced ADA/carb modified diet . Provided with CCM team contact information and encouraged to reach out as needed  Self Care Activities:  . Calls provider office for new concerns or questions . Performs some ADLs independently  Patient Goals Over the next 30 days, patient will: . Check blood sugar as instructed and if you feel it is too high or too low . Talk with PCP about options other than Basaglar to treat diabetes . Talk with RN Care Manager and PCP/PharmD about blood sugar testing options . Eat an ADA/carb modified diet . Call PCP or endocrinologist with any blood sugar readings outside of recommended range . Call RN Care manager as needed [phone]    Follow Up Plan:  . Telephone follow up appointment with care management team member scheduled for: 10/02/20 with RNCM . The patient has been provided with contact information for the care management team and has been advised to call with any health related questions or concerns.  . Next PCP appointment scheduled for: 10/01/20 with PCP  [name], BSN, RN-BC [place] [facility] [place] Management Direct Dial: [phone]

## 2020-09-29 NOTE — Patient Instructions (Signed)
Visit Information  PATIENT GOALS: Goals Addressed              This Visit's Progress     Patient Stated   .  COMPLETED: Patient will report decreasing Hgb A1C by 0.2 points within the next 90 days. (pt-stated)        CARE PLAN ENTRY (see longtitudinal plan of care for additional care plan information)  Objective:  Lab Results  Component Value Date   HGBA1C 8.1 (H) 09/17/2019 .   Lab Results  Component Value Date   CREATININE 1.53 (H) 09/17/2019   CREATININE 1.53 (H) 04/03/2019   CREATININE 1.44 (H) 11/20/2018 .   Marland Kitchen No results found for: EGFR  Current Barriers:  Marland Kitchen Knowledge Deficits related to basic Diabetes pathophysiology and self care/management . Knowledge Deficits related to medications used for management of diabetes . Vision Barriers (having trouble with eyesight at this time)  Case Manager Clinical Goal(s):  Over the next 90 days, patient will demonstrate improved adherence to prescribed treatment plan for diabetes self care/management as evidenced by: decreasing Hgb A1C by 0.2 points  . Verbalize daily monitoring and recording of CBG within 30 days . Verbalize adherence to ADA/ carb modified diet within the next 30 days . Verbalize adherence to prescribed medication regimen within the next 30 days  . Patient will verbalize discussing new meter/monitoring system with provider that will call out her readings since she is having trouble seeing machine . Verbalize receiving assistance from family/husband with completing application over telephone for [facility] transportation to medical appointments  Interventions:  . Provided education to patient about basic DM disease process . Reviewed medications with patient and discussed importance of medication adherence . Discussed plans with patient for ongoing care management follow up and provided patient with direct contact information for care management team . Provided patient with written educational materials related to  hypo and hyperglycemia and importance of correct treatment . Reviewed scheduled/upcoming provider appointments including: primary care and endocrinology appointments . Offered to call [facility] to assist patient to do application over telephone (patient declines at this time); sent patient [facility] brochure and explained to her how to complete application over the telephone; encouraged to contact this [place] if she has questions or concerns . Sent message to CCM Embedded Social Forensic psychologist at [employer] patient may need assistance with transportation application  Patient Self Care Activities:  . Self administers oral medications as prescribed . Self administers insulin as prescribed . Attends all scheduled provider appointments . Checks blood sugars as prescribed and utilize hyper and hypoglycemia protocol as needed . Adheres to prescribed ADA/carb modified . Contacts [facility] to complete transportation application over telephone  Initial goal documentation       Other   .  Manage my Diabetes   Not on track     Timeframe:  Long-Range Goal Priority:  Medium Start Date:  09/29/20                           Expected End Date:   09/29/21                      Follow Up Date 10/02/20    . Check blood sugar as instructed and if you feel it is too high or too low . Talk with PCP about options other than Basaglar to treat diabetes . Talk with RN Care Manager and PCP/PharmD about blood  sugar testing options . Eat an ADA/carb modified diet . Call PCP or endocrinologist with any blood sugar readings outside of recommended range . Call RN Care manager as needed [phone]    Why is this important?    Checking your blood sugar at home helps to keep it from getting very high or very low.   Writing the results in a diary or log helps the doctor know how to care for you.   Your blood sugar log should have the time, date and the results.   Also, write down the amount of  insulin or other medicine that you take.   Other information, like what you ate, exercise done and how you were feeling, will also be helpful.     Notes:        Patient verbalizes understanding of instructions provided today and agrees to view in [place].   Follow Up Plan:  . Telephone follow up appointment with care management team member scheduled for: 10/02/20 with RNCM . The patient has been provided with contact information for the care management team and has been advised to call with any health related questions or concerns.  . Next PCP appointment scheduled for: 10/01/20 with PCP  [name], BSN, RN-BC [place] [facility] [place] Management Direct Dial: [phone]

## 2020-10-01 ENCOUNTER — Ambulatory Visit (INDEPENDENT_AMBULATORY_CARE_PROVIDER_SITE_OTHER): Payer: Medicare Other | Admitting: Family Medicine

## 2020-10-01 ENCOUNTER — Ambulatory Visit: Payer: Medicare Other | Admitting: Pharmacist

## 2020-10-01 ENCOUNTER — Encounter: Payer: Self-pay | Admitting: Family Medicine

## 2020-10-01 ENCOUNTER — Other Ambulatory Visit: Payer: Self-pay

## 2020-10-01 VITALS — BP 137/60 | HR 54 | Temp 98.0°F | Ht 62.0 in | Wt 152.4 lb

## 2020-10-01 DIAGNOSIS — E559 Vitamin D deficiency, unspecified: Secondary | ICD-10-CM | POA: Diagnosis not present

## 2020-10-01 DIAGNOSIS — E1159 Type 2 diabetes mellitus with other circulatory complications: Secondary | ICD-10-CM

## 2020-10-01 DIAGNOSIS — N1832 Chronic kidney disease, stage 3b: Secondary | ICD-10-CM

## 2020-10-01 DIAGNOSIS — E785 Hyperlipidemia, unspecified: Secondary | ICD-10-CM | POA: Diagnosis not present

## 2020-10-01 DIAGNOSIS — E034 Atrophy of thyroid (acquired): Secondary | ICD-10-CM

## 2020-10-01 DIAGNOSIS — E1122 Type 2 diabetes mellitus with diabetic chronic kidney disease: Secondary | ICD-10-CM | POA: Diagnosis not present

## 2020-10-01 DIAGNOSIS — Z794 Long term (current) use of insulin: Secondary | ICD-10-CM

## 2020-10-01 DIAGNOSIS — E1169 Type 2 diabetes mellitus with other specified complication: Secondary | ICD-10-CM | POA: Diagnosis not present

## 2020-10-01 DIAGNOSIS — I152 Hypertension secondary to endocrine disorders: Secondary | ICD-10-CM | POA: Diagnosis not present

## 2020-10-01 DIAGNOSIS — E039 Hypothyroidism, unspecified: Secondary | ICD-10-CM | POA: Diagnosis not present

## 2020-10-01 LAB — BAYER DCA HB A1C WAIVED: HB A1C (BAYER DCA - WAIVED): 7.4 % — ABNORMAL HIGH (ref <7.0)

## 2020-10-01 MED ORDER — FUROSEMIDE 40 MG PO TABS: ORAL_TABLET | ORAL | 3 refills | Status: DC

## 2020-10-01 MED ORDER — CHOLECALCIFEROL 25 MCG (1000 UT) PO CAPS: 1000.0000 [IU] | ORAL_CAPSULE | Freq: Every day | ORAL | 3 refills | Status: DC

## 2020-10-01 MED ORDER — ATORVASTATIN CALCIUM 10 MG PO TABS: 10.0000 mg | ORAL_TABLET | Freq: Every day | ORAL | 3 refills | Status: DC

## 2020-10-01 MED ORDER — CARVEDILOL 12.5 MG PO TABS: 12.5000 mg | ORAL_TABLET | Freq: Two times a day (BID) | ORAL | 3 refills | Status: DC

## 2020-10-01 MED ORDER — AMLODIPINE BESYLATE 5 MG PO TABS
1.0000 | ORAL_TABLET | Freq: Every day | ORAL | 3 refills | Status: DC
Start: 2020-10-01 — End: 2021-06-29

## 2020-10-01 MED ORDER — LEVOTHYROXINE SODIUM 75 MCG PO TABS: 75.0000 ug | ORAL_TABLET | Freq: Every day | ORAL | 3 refills | Status: DC

## 2020-10-01 MED ORDER — FLUTICASONE PROPIONATE 50 MCG/ACT NA SUSP: 2.0000 | Freq: Every day | NASAL | 3 refills | Status: DC

## 2020-10-01 MED ORDER — PANTOPRAZOLE SODIUM 20 MG PO TBEC: 20.0000 mg | DELAYED_RELEASE_TABLET | Freq: Every day | ORAL | 3 refills | Status: DC

## 2020-10-01 NOTE — Patient Instructions (Signed)
Visit Information  PATIENT GOALS: Goals Addressed              This Visit's Progress     Patient Stated   .  I would like to check my blood sugar (pt-stated)        CARE PLAN ENTRY (see longitudinal plan of care for additional care plan information)  Current Barriers:  . Social, financial, community barriers:  . Diabetes: D4PB; complicated by chronic medical conditions including HTN, CKD, HLD, most recent A1c 7.4% . Most recent eGFR: 29 . Current antihyperglycemic regimen: BASAGLAR 8 UNITS daily . Reports hypoglycemic symptoms (reports at nighttime)--dizziness, lightheadedness, shaking, sweating o Patient is eating a light snack before bedtime and denies any hypoglycemia since we last spoke . Denies hyperglycemic symptoms . Current exercise: n/a . Current blood glucose readings: FBG 104, post-FBG 200s . Cardiovascular risk reduction: o Current hypertensive regimen:amlodipine, carvedilol, not on ACE/ARB o Current hyperlipidemia regimen: atorvastatin o Current antiplatelet regimen: n/a  Pharmacist Clinical Goal(s):  Marland Kitchen Over the next 90 days, patient will work with PharmD and primary care provider to address needs related to diabetes management  Interventions: . Comprehensive medication review performed, medication list updated in electronic medical record . Inter-disciplinary care team collaboration (see longitudinal plan of care)--discussed case with CCM SW Scott . Highly encouraged patient to call endocrine with concerns of hypoglycemia and issues with checking blood sugar due to vision impairment . RX and clinical notes sent to [facility] via [employer] [facility] portal as a last attempt to get Libre 2 CGM covered   Patient Self Care Activities:  . Patient will check blood glucose twice daily, document, and provide at future appointments . Patient will focus on medication adherence by continuing to take medications as prescribed . Patient will take medications as  prescribed . Patient will contact provider with any episodes of hypoglycemia . Patient will report any questions or concerns to provider   Please see past updates related to this goal by clicking on the "Past Updates" button in the selected goal         The patient verbalized understanding of instructions, educational materials, and care plan provided today and declined offer to receive copy of patient instructions, educational materials, and care plan.   Telephone follow up appointment with care management team member scheduled for: 10/08/20  Signature Regina Eck, PharmD, BCPS Clinical Pharmacist, [place]  [facility] Phone [phone]

## 2020-10-01 NOTE — Progress Notes (Signed)
Chronic Care Management   Follow Up Note   10/01/2020 Name: [name] MRN: [id number] DOB: [birth year]  Referred by: [name], DO Reason for referral : Diabetes   [name] is a 75 y.o. year old female who is a primary care patient of [name], DO. The CCM team was consulted for assistance with chronic disease management and care coordination needs.    Review of patient status, including review of consultants reports, relevant laboratory and other test results, and collaboration with appropriate care team members and the patient's provider was performed as part of comprehensive patient evaluation and provision of chronic care management services.    SDOH (Social Determinants of Health) assessments performed: No See Care Plan activities for detailed interventions related to [facility])     Outpatient Encounter Medications as of 10/01/2020  Medication Sig  . ACCU-CHEK AVIVA PLUS test strip CHECK BLOOD SUGAR UP TO 4 TIMES A DAY  . Accu-Chek Softclix Lancets lancets CHECK BLOOD SUGAR 2 TIMES A DAY  . amLODipine (NORVASC) 5 MG tablet Take 1 tablet (5 mg total) by mouth daily.  Marland Kitchen atorvastatin (LIPITOR) 10 MG tablet Take 1 tablet (10 mg total) by mouth daily.  . blood glucose meter kit and supplies Dispense based on patient and insurance preference. Use up to four times daily as directed. (FOR ICD-10 E10.9, E11.9).  . carvedilol (COREG) 12.5 MG tablet Take 1 tablet (12.5 mg total) by mouth 2 (two) times daily with a meal.  . Cholecalciferol 25 MCG (1000 UT) capsule Take 1 capsule (1,000 Units total) by mouth daily.  . colchicine 0.6 MG tablet Take 0.6 mg by mouth daily as needed.  . Febuxostat 80 MG TABS Take by mouth.  . fluticasone (FLONASE) 50 MCG/ACT nasal spray Place 2 sprays into both nostrils daily. Use 2 sprays in each nostril daily  . furosemide (LASIX) 40 MG tablet TAKE 1 TABLET TWICE WEEKLY  . Insulin Glargine (BASAGLAR KWIKPEN) 100 UNIT/ML Inject 8 Units  into the skin daily.  Marland Kitchen ketorolac (ACULAR) 0.5 % ophthalmic solution Place 1 drop into the right eye every morning.  . Lancets Misc. MISC Use as directed to check blood sugar up to 4x daily. E11.65  . levothyroxine (SYNTHROID) 75 MCG tablet Take 1 tablet (75 mcg total) by mouth daily before breakfast.  . pantoprazole (PROTONIX) 20 MG tablet Take 1 tablet (20 mg total) by mouth daily.  . prednisoLONE acetate (PRED FORTE) 1 % ophthalmic suspension Place 1 drop into the right eye 4 (four) times daily.  Marland Kitchen ULTICARE MICRO PEN NEEDLES 32G X 4 MM MISC TWICE DAILY   No facility-administered encounter medications on file as of 10/01/2020.     Objective:   Goals Addressed              This Visit's Progress     Patient Stated   .  I would like to check my blood sugar (pt-stated)        CARE PLAN ENTRY (see longitudinal plan of care for additional care plan information)  Current Barriers:  . Social, financial, community barriers:  . Diabetes: H2ZY; complicated by chronic medical conditions including HTN, CKD, HLD, most recent A1c 7.4% . Most recent eGFR: 29 . Current antihyperglycemic regimen: BASAGLAR 8 UNITS daily . Reports hypoglycemic symptoms (reports at nighttime)--dizziness, lightheadedness, shaking, sweating o Patient is eating a light snack before bedtime and denies any hypoglycemia since we last spoke . Denies hyperglycemic symptoms . Current exercise: n/a . Current  blood glucose readings: FBG 104, post-FBG 200s . Cardiovascular risk reduction: o Current hypertensive regimen:amlodipine, carvedilol, not on ACE/ARB o Current hyperlipidemia regimen: atorvastatin o Current antiplatelet regimen: n/a  Pharmacist Clinical Goal(s):  Marland Kitchen Over the next 90 days, patient will work with PharmD and primary care provider to address needs related to diabetes management  Interventions: . Comprehensive medication review performed, medication list updated in electronic medical  record . Inter-disciplinary care team collaboration (see longitudinal plan of care)--discussed case with CCM SW Scott . Highly encouraged patient to call endocrine with concerns of hypoglycemia and issues with checking blood sugar due to vision impairment . RX and clinical notes sent to [facility] via [employer] C[employer] portal as a last attempt to get Libre 2 CGM covered   Patient Self Care Activities:  . Patient will check blood glucose twice daily, document, and provide at future appointments . Patient will focus on medication adherence by continuing to take medications as prescribed . Patient will take medications as prescribed . Patient will contact provider with any episodes of hypoglycemia . Patient will report any questions or concerns to provider   Please see past updates related to this goal by clicking on the "Past Updates" button in the selected goal          Plan:   Telephone follow up appointment with care management team member scheduled for: 10/08/20   SIGNATURE Regina Eck, PharmD, BCPS Clinical Pharmacist, [place]  [facility] Phone [phone]

## 2020-10-01 NOTE — Progress Notes (Signed)
Subjective: CC: DM PCP: [name], DO POE:UMPNT [name] is a 75 y.o. female presenting to clinic today for:  1. Type 2 Diabetes with hypertension, hyperlipidemia:  Patient's diabetes is closely managed by her endocrinologist.  She admits that her blood sugar has gone as low as 83 and at which point she will drink a [facility] in efforts to bring it back up.  She feels jittery at 64.  She is compliant with her insulin as directed.  She really wants to have a CGM and is asking for assistance with this.  Apparently our CCM nurse is trying to help her with a phone.  She sees Dr. Moshe Cipro for nephrology.  She is on Uloric for gout.  Last eye exam: Scheduled Last foot exam: Up-to-date Last A1c:  Lab Results  Component Value Date   HGBA1C 7.4 ([name]) 10/01/2020   Nephropathy screen indicated?:  Has CKD Last flu, zoster and/or pneumovax:  Immunization History  Administered Date(s) Administered  . Fluad Quad(high Dose 65+) 02/05/2019, 03/05/2020  . Influenza, High Dose Seasonal PF 03/05/2016, 03/14/2017, 03/01/2018  . Influenza,inj,Quad PF,6+ Mos 02/16/2013, 02/27/2015  . PFIZER(Purple Top)SARS-COV-2 Vaccination 06/13/2019, 07/04/2019, 03/05/2020  . Pneumococcal Conjugate-13 10/11/2014  . Pneumococcal Polysaccharide-23 10/10/2012  . Tdap 10/06/2012    ROS: No chest pain, shortness of breath.  2.   ROS: Per HPI  Allergies  Allergen Reactions  . Aspirin Nausea And Vomiting  . Ciprofloxacin Other (See Comments)    Upset Stomach  . Codeine Nausea And Vomiting    Makes me sick   . Losartan     Hyperkalemia   . Micardis [Telmisartan] Other (See Comments)    unknown  . Nexium [Esomeprazole Magnesium] Other (See Comments)    Causes internal bleeding  . Niaspan [Niacin Er] Other (See Comments)    Reaction is unknown  . Onglyza [Saxagliptin] Other (See Comments)    Reaction is unknown  . Other     No otc pain medications  . Rofecoxib Other (See Comments)     unknown  . Simvastatin   . Tequin [Gatifloxacin] Other (See Comments)    Reaction is unknown  . Welchol [Colesevelam Hcl]   . Amoxicillin Nausea And Vomiting and Rash    Has patient had a PCN reaction causing immediate rash, facial/tongue/throat swelling, SOB or lightheadedness with hypotension: Yes Has patient had a PCN reaction causing severe rash involving mucus membranes or skin necrosis: no  Has patient had a PCN reaction that required hospitalization No Has patient had a PCN reaction occurring within the last 10 years: No If all of the above answers are "NO", then may proceed with Cephalosporin use.    Past Medical History:  Diagnosis Date  . Anal fistula   . Anemia in chronic renal disease    Aranesp injection --  when Hg <11, last injection 12-24-14.  Marland Kitchen Anxiety   . Aphakia of eye, right 10/02/2019   Condition resolved, status post vitrectomy, removal IOL, insertion Yamani scleral tunnel [facility] CT Lucio lens June 2021  . Arthritis    knees and hand/fingers. "broke back"-being evaluated for this"weakness left leg"  . Cataract    both eyes done  . 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 CKD (chronic kidney disease), stage III [facility])    nephrologist--  dr Mercy Moore-- [place]  07-09-2016  . Complication of anesthesia    post-op confusion   . Constipation   . Coronary artery disease   .  Diabetic gastroparesis ([place])   . Diabetic retinopathy ([place])    bilateral --  monitored by dr [name]  . Diverticulosis of colon   . GAD (generalized anxiety disorder)   . GERD (gastroesophageal reflux disease)   . History of colon polyps    benign  . History of esophagitis   . History of GI bleed    upper 2009  due to esophagitis  &  2002  due to Mallory-Weiss tear  . History of hyperkalemia    pt had previously been canceled twice dos due to elevated K+, 07-29-2016 last date cancelled -- pt visited pcp same day treated w/ kayexelate and K+ came down, pt brought all her  medication's in to pcp office and found pt was taking losartan that had been discontinued due to ckd, pcp stated this was cause of elevated K+  . 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 History of rectal abscess    12-29-2004  bedside I & D  . Hyperlipidemia   . Hypertension   . Hypothyroidism   . LAFB (left anterior fascicular block)   . Right bundle branch block   . Sacral decubitus ulcer    since 2014- 01-02-15 remains with wound" gauze dressing changes daily.  . Type II diabetes mellitus ([place])     Current Outpatient Medications:  .  ACCU-CHEK AVIVA PLUS test strip, CHECK BLOOD SUGAR UP TO 4 TIMES A DAY, Disp: 200 strip, Rfl: 5 .  Accu-Chek Softclix Lancets lancets, CHECK BLOOD SUGAR 2 TIMES A DAY, Disp: 200 each, Rfl: 2 .  amLODipine (NORVASC) 5 MG tablet, TAKE 1 TABLET EVERY DAY, Disp: 90 tablet, Rfl: 0 .  atorvastatin (LIPITOR) 10 MG tablet, Take 1 tablet (10 mg total) by mouth daily., Disp: 90 tablet, Rfl: 3 .  blood glucose meter kit and supplies, Dispense based on patient and insurance preference. Use up to four times daily as directed. (FOR ICD-10 E10.9, E11.9)., Disp: 1 each, Rfl: 0 .  carvedilol (COREG) 12.5 MG tablet, Take 12.5 mg by mouth 2 (two) times daily with a meal., Disp: , Rfl:  .  Cholecalciferol 25 MCG (1000 UT) capsule, Take 1 capsule (1,000 Units total) by mouth daily., Disp: 90 capsule, Rfl: 3 .  colchicine 0.6 MG tablet, Take 0.6 mg by mouth daily as needed., Disp: , Rfl:  .  Febuxostat 80 MG TABS, Take by mouth., Disp: , Rfl:  .  fluticasone (FLONASE) 50 MCG/ACT nasal spray, Use 2 sprays in each nostril daily, Disp: , Rfl:  .  furosemide (LASIX) 40 MG tablet, TAKE 1 TABLET TWICE WEEKLY, Disp: 26 tablet, Rfl: 0 .  Insulin Glargine (BASAGLAR KWIKPEN) 100 UNIT/ML, Inject 8 Units into the skin daily., Disp: 9 mL, Rfl: 3 .  ketorolac (ACULAR) 0.5 % ophthalmic solution, Place 1 drop into the right eye every morning., Disp: 5 mL, Rfl: 12 .   Lancets Misc. MISC, Use as directed to check blood sugar up to 4x daily. E11.65, Disp: 300 each, Rfl: 3 .  levothyroxine (SYNTHROID) 75 MCG tablet, TAKE 1 TABLET DAILY BEFORE BREAKFAST, Disp: 90 tablet, Rfl: 3 .  pantoprazole (PROTONIX) 20 MG tablet, Take 1 tablet (20 mg total) by mouth daily., Disp: 90 tablet, Rfl: 3 .  prednisoLONE acetate (PRED FORTE) 1 % ophthalmic suspension, Place 1 drop into the right eye 4 (four) times daily., Disp: 5 mL, Rfl: 0 .  ULTICARE MICRO PEN NEEDLES 32G X 4 MM MISC, TWICE DAILY, Disp: 100  each, Rfl: 5 Social History   Socioeconomic History  . Marital status: Married    Spouse name: [name]  . Number of children: 0  . Years of education: 50  . Highest education level: Not on file  Occupational History  . Occupation: Retired    Fish farm manager: RETIRED  Tobacco Use  . Smoking status: Never Smoker  . Smokeless tobacco: Never Used  Vaping Use  . Vaping Use: Never used  Substance and Sexual Activity  . Alcohol use: No  . Drug use: No  . Sexual activity: Not Currently  Other Topics Concern  . Not on file  Social History Narrative   Lives with husband.    Caffeine use: none   Social Determinants of Radio broadcast assistant Strain: Not on file  Food Insecurity: Not on file  Transportation Needs: Unmet Transportation Needs  . Lack of Transportation (Medical): Yes  . Lack of Transportation (Non-Medical): No  Physical Activity: Not on file  Stress: Not on file  Social Connections: Not on file  Intimate Partner Violence: Not on file   Family History  Problem Relation Age of Onset  . Colon cancer Father 61  . Prostate cancer Father   . Diabetes Father   . Coronary artery disease Mother 5  . Heart disease Mother   . Diabetes Mother   . Coronary artery disease Sister 80  . Diabetes Sister   . Heart disease Sister   . Diabetes Sister   . Diabetes Sister   . Diabetes Sister   . Diabetes Sister   . Diabetes Sister   . Diabetes Maternal Grandmother    . Esophageal cancer Neg Hx   . Stomach cancer Neg Hx   . Rectal cancer Neg Hx     Objective: Office vital signs reviewed. BP 137/60   Pulse (!) 54   Temp 98 F (36.7 C) (Temporal)   Ht _0  (1.575 m)   Wt 152 lb 6.4 oz (69.1 kg)   BMI 27.87 kg/m   Physical Examination:  General: Awake, alert, chronically ill-appearing female, No acute distress HEENT: Normal; sclera white.  No exophthalmos Cardio: regular rate and rhythm, S1S2 heard, no murmurs appreciated Pulm: clear to auscultation bilaterally, no wheezes, rhonchi or rales; normal work of breathing on room air MSK: Normal tone  Assessment/ Plan: 75 y.o. female   Type 2 diabetes mellitus with stage 3b chronic kidney disease, with long-term current use of insulin ([place]) - Plan: Bayer DCA Hb A1c Waived  Hyperlipidemia associated with type 2 diabetes mellitus ([place]) - Plan: atorvastatin (LIPITOR) 10 MG tablet  Hypertension associated with diabetes ([place]) - Plan: amLODipine (NORVASC) 5 MG tablet  Hypothyroidism due to acquired atrophy of thyroid - Plan: levothyroxine (SYNTHROID) 75 MCG tablet  A1c down to 7.3.  Continue to follow-up with endocrinology.  Medications have been renewed.  Blood pressure at goal  Continue statin  Synthroid renewed  Orders Placed This Encounter  Procedures  . Bayer DCA Hb A1c Waived   No orders of the defined types were placed in this encounter.    [name], DO [place] [phone]

## 2020-10-02 ENCOUNTER — Ambulatory Visit: Payer: Medicare Other | Admitting: *Deleted

## 2020-10-02 DIAGNOSIS — E1122 Type 2 diabetes mellitus with diabetic chronic kidney disease: Secondary | ICD-10-CM

## 2020-10-02 DIAGNOSIS — Z794 Long term (current) use of insulin: Secondary | ICD-10-CM

## 2020-10-02 DIAGNOSIS — E113499 Type 2 diabetes mellitus with severe nonproliferative diabetic retinopathy without macular edema, unspecified eye: Secondary | ICD-10-CM

## 2020-10-02 LAB — COMPREHENSIVE METABOLIC PANEL
ALT: <5 IU/L (ref 0–32)
AST: 11 IU/L (ref 0–40)
Albumin/Globulin Ratio: 1.7 (ref 1.2–2.2)
Albumin: 4.1 g/dL (ref 3.7–4.7)
Alkaline Phosphatase: 90 IU/L (ref 44–121)
BUN/Creatinine Ratio: 17 (ref 12–28)
BUN: 29 mg/dL — ABNORMAL HIGH (ref 8–27)
Bilirubin Total: 0.4 mg/dL (ref 0.0–1.2)
CO2: 22 mmol/L (ref 20–29)
Calcium: 8.6 mg/dL — ABNORMAL LOW (ref 8.7–10.3)
Chloride: 103 mmol/L (ref 96–106)
Creatinine, Ser: 1.68 mg/dL — ABNORMAL HIGH (ref 0.57–1.00)
Globulin, Total: 2.4 g/dL (ref 1.5–4.5)
Glucose: 121 mg/dL — ABNORMAL HIGH (ref 65–99)
Potassium: 4.1 mmol/L (ref 3.5–5.2)
Sodium: 139 mmol/L (ref 134–144)
Total Protein: 6.5 g/dL (ref 6.0–8.5)
eGFR: 32 mL/min/{1.73_m2} — ABNORMAL LOW (ref >59)

## 2020-10-02 LAB — LIPID PANEL
Chol/HDL Ratio: 2.8 ratio (ref 0.0–4.4)
Cholesterol, Total: 112 mg/dL (ref 100–199)
HDL: 40 mg/dL (ref >39)
LDL Chol Calc (NIH): 58 mg/dL (ref 0–99)
Triglycerides: 68 mg/dL (ref 0–149)
VLDL Cholesterol Cal: 14 mg/dL (ref 5–40)

## 2020-10-02 LAB — VITAMIN D 25 HYDROXY (VIT D DEFICIENCY, FRACTURES): Vit D, 25-Hydroxy: 25.6 ng/mL — ABNORMAL LOW (ref 30.0–100.0)

## 2020-10-02 LAB — T4, FREE: Free T4: 1.79 ng/dL — ABNORMAL HIGH (ref 0.82–1.77)

## 2020-10-02 LAB — TSH: TSH: 0.904 u[IU]/mL (ref 0.450–4.500)

## 2020-10-02 NOTE — Patient Instructions (Signed)
Visit Information  PATIENT GOALS: Goals Addressed            This Visit's Progress   . Manage my Diabetes   On track    Timeframe:  Long-Range Goal Priority:  Medium Start Date:  09/29/20                           Expected End Date:   09/29/21                      Follow Up Date 10/07/20   . Check blood sugar as instructed and if you feel it is too high or too low . Talk with endocrinologist to see if there are any other options for managing blood sugar other than a daily injection of Basaglar . Talk with RN Care Manager and PCP/PharmD about blood sugar testing options . Eat an ADA/carb modified diet . Call PCP or endocrinologist with any blood sugar readings outside of recommended range . Call RN Care manager as needed [phone]   Why is this important?    Checking your blood sugar at home helps to keep it from getting very high or very low.   Writing the results in a diary or log helps the doctor know how to care for you.   Your blood sugar log should have the time, date and the results.   Also, write down the amount of insulin or other medicine that you take.   Other information, like what you ate, exercise done and how you were feeling, will also be helpful.     Notes:        The patient verbalized understanding of instructions, educational materials, and care plan provided today and declined offer to receive copy of patient instructions, educational materials, and care plan.   Follow Up Plan:  . Telephone follow up appointment with care management team member scheduled for: 10/07/20 with RNCM . The patient has been provided with contact information for the care management team and has been advised to call with any health related questions or concerns.  . Next appointment with endocrinologist scheduled for: 10/16/20 with Dr Dorris Fetch  [name], BSN, RN-BC [place] [facility] [place] Management Direct Dial: [phone]

## 2020-10-02 NOTE — Chronic Care Management (AMB) (Signed)
Chronic Care Management   CCM RN Visit Note  10/02/2020 Name: [name] MRN: [id number] DOB: [birth year]  Subjective: [name] is a 75 y.o. year old female who is a primary care patient of [name], DO. The care management team was consulted for assistance with disease management and care coordination needs.    Engaged with patient by telephone for follow up visit in response to provider referral for case management and/or care coordination services.   Consent to Services:  The patient was given information about Chronic Care Management services, agreed to services, and gave verbal consent prior to initiation of services.  Please see initial visit note for detailed documentation.   Patient agreed to services and verbal consent obtained.   Assessment: Review of patient past medical history, allergies, medications, health status, including review of consultants reports, laboratory and other test data, was performed as part of comprehensive evaluation and provision of chronic care management services.   SDOH (Social Determinants of Health) assessments and interventions performed:    CCM Care Plan  Allergies  Allergen Reactions  . Aspirin Nausea And Vomiting  . Ciprofloxacin Other (See Comments)    Upset Stomach  . Codeine Nausea And Vomiting    Makes me sick   . Losartan     Hyperkalemia   . Micardis [Telmisartan] Other (See Comments)    unknown  . Nexium [Esomeprazole Magnesium] Other (See Comments)    Causes internal bleeding  . Niaspan [Niacin Er] Other (See Comments)    Reaction is unknown  . Onglyza [Saxagliptin] Other (See Comments)    Reaction is unknown  . Other     No otc pain medications  . Rofecoxib Other (See Comments)    unknown  . Simvastatin   . Tequin [Gatifloxacin] Other (See Comments)    Reaction is unknown  . Welchol [Colesevelam Hcl]   . Amoxicillin Nausea And Vomiting and Rash    Has patient had a PCN reaction causing immediate  rash, facial/tongue/throat swelling, SOB or lightheadedness with hypotension: Yes Has patient had a PCN reaction causing severe rash involving mucus membranes or skin necrosis: no  Has patient had a PCN reaction that required hospitalization No Has patient had a PCN reaction occurring within the last 10 years: No If all of the above answers are "NO", then may proceed with Cephalosporin use.     Outpatient Encounter Medications as of 10/02/2020  Medication Sig  . ACCU-CHEK AVIVA PLUS test strip CHECK BLOOD SUGAR UP TO 4 TIMES A DAY  . Accu-Chek Softclix Lancets lancets CHECK BLOOD SUGAR 2 TIMES A DAY  . amLODipine (NORVASC) 5 MG tablet Take 1 tablet (5 mg total) by mouth daily.  Marland Kitchen atorvastatin (LIPITOR) 10 MG tablet Take 1 tablet (10 mg total) by mouth daily.  . blood glucose meter kit and supplies Dispense based on patient and insurance preference. Use up to four times daily as directed. (FOR ICD-10 E10.9, E11.9).  . carvedilol (COREG) 12.5 MG tablet Take 1 tablet (12.5 mg total) by mouth 2 (two) times daily with a meal.  . Cholecalciferol 25 MCG (1000 UT) capsule Take 1 capsule (1,000 Units total) by mouth daily.  . colchicine 0.6 MG tablet Take 0.6 mg by mouth daily as needed.  . Febuxostat 80 MG TABS Take by mouth.  . fluticasone (FLONASE) 50 MCG/ACT nasal spray Place 2 sprays into both nostrils daily. Use 2 sprays in each nostril daily  . furosemide (LASIX) 40 MG tablet TAKE 1 TABLET  TWICE WEEKLY  . Insulin Glargine (BASAGLAR KWIKPEN) 100 UNIT/ML Inject 8 Units into the skin daily.  Marland Kitchen ketorolac (ACULAR) 0.5 % ophthalmic solution Place 1 drop into the right eye every morning.  . Lancets Misc. MISC Use as directed to check blood sugar up to 4x daily. E11.65  . levothyroxine (SYNTHROID) 75 MCG tablet Take 1 tablet (75 mcg total) by mouth daily before breakfast.  . pantoprazole (PROTONIX) 20 MG tablet Take 1 tablet (20 mg total) by mouth daily.  . prednisoLONE acetate (PRED FORTE) 1 %  ophthalmic suspension Place 1 drop into the right eye 4 (four) times daily.  Marland Kitchen ULTICARE MICRO PEN NEEDLES 32G X 4 MM MISC TWICE DAILY   No facility-administered encounter medications on file as of 10/02/2020.    Patient Active Problem List   Diagnosis Date Noted  . Cystoid macular edema, right eye 01/14/2020  . Follow-up examination after eye surgery 11/01/2019  . Controlled type 2 diabetes mellitus with stable proliferative retinopathy of both eyes, with long-term current use of insulin ([name]) 10/02/2019  . 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 Retinal hemorrhage of left eye 10/02/2019  . Retinal microaneurysm of both eyes 10/02/2019  . Dislocated intraocular lens, initial encounter 10/02/2019  . Vitamin D deficiency 11/15/2017  . Foot ulcer ([place]) 06/02/2016  . 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 Cellulitis 05/05/2016  . Diabetic foot infection ([place]) 05/05/2016  . Nausea and vomiting 01/23/2015  . CKD (chronic kidney disease), stage III ([place])   . Anemia in chronic renal disease   . Gastroesophageal reflux disease without esophagitis 10/11/2014  . Hypothyroidism 05/18/2014  . Hip fracture requiring operative repair ([place])   . Fall   . Osteoporosis 09/06/2013  . Type 2 diabetes mellitus with stage 3b chronic kidney disease, with long-term current use of insulin ([place]) 07/20/2013  . Edema 03/03/2011  . Mixed hyperlipidemia   . Diabetic retinopathy ([place]) 10/28/2007  . Hyperlipidemia associated with type 2 diabetes mellitus ([place]) 10/28/2007  . Anxiety state 10/28/2007  . Depression 10/28/2007  . Essential hypertension 10/28/2007  . Disorder resulting from impaired renal function 10/28/2007  . COLONIC POLYPS, HYPERPLASTIC 11/17/2006  . DIVERTICULOSIS, COLON 11/17/2006    Conditions to be addressed/monitored:DMII, CKD Stage III and visual impairment  Care Plan : RNCM:  Diabetes Type 2 (Adult)  Updates made by Ilean China, RN since 10/02/2020 12:00 AM    Problem: Glycemic Management (Diabetes, Type 2)   Priority: Medium    Long-Range Goal: Diabetes Management Optimized   Start Date: 09/29/2020  This Visit's Progress: On track  Recent Progress: On track  Priority: Medium  Note:   Current Barriers:  . 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  . Limited vision . Unable to independently read, drive, administer medications because of her limited vision . Other chronic conditions: depression, CKD  Nurse Case Manager Clinical Goal(s):  . patient will work with PCP/endocrinologist to address needs related to medical management of diabetes . patient will meet with RN Care Manager to address self-management of diabetes  Interventions:  . 1:1 collaboration with Claretta Fraise, MD regarding development and update of comprehensive plan of care as evidenced by provider attestation and co-signature . Inter-disciplinary care team collaboration (see longitudinal plan of care) . Evaluation of current treatment plan related to diabetes and patient's adherence to plan as established by provider. . Chart reviewed including  relevant office notes and lab results . Consulted by LCSW regarding patient's limited vision as a barrier to blood sugar testing and insulin administration . Talked with patient about family/social support o Her husband assists with insulin administration and blood sugar testing. He is in poor health and she is concerned about her ability to care for herself if he isn't able to assist. . Reviewed and discussed medications o Basaglar 8 units at bedtime . Discussed potential options for blood sugar testing o Dexcom is compatable with iPhone 6 or newer. Meta Hatchet can be setup to verbally read out blood sugar results and trend. - May not qualify because she doesn't inject insulin 3 or more times a  day - iPhone 6 isn't available for purchase in stores. Would have to buy used or refurbished online. Does not have anyone that can assist with this. Cannot afford a newer phone that would be available for purchase in stores. - Would also need wifi internet or data package for app to work  - May qualify for a free government phone and monthly data package if she qualifies for another government program like medicaid or [employer], but husband does not want to apply for government assistance.  o Glucometer, like [employer], that will verbalize blood sugar results. Indicated for diabetic patients that are blind.  - Unsure of insurance coverage. May need letter of medical necessity.  - Would still need to do finger sticks o Freestyle Libre 2 has auditory alarms that indicate high and low blood sugar . Collaborated with PCP and PharmD regarding other medication options that would be easier for her to administer on her own.  . Staff message sent to Dr Dorris Fetch regarding Basaglar to see if there are any non-injectable options for her since she relies on her husband to prep and administer the injection . Collaborated with PharmD regarding blood sugar monitor options o PharmD sent in documentation and an order for insurance approval of Freestyle Libre 2 . Patient advised that we are waiting for a response regarding insurance coverage . Advised that if it is denied, she will need to talk with her endocrinologist to see if he knows of any additional options that will work for her situation . Reviewed and discussed upcoming appointment with endocrinologist . Provided with CCM team contact information and encouraged to reach out as needed  Self Care Activities:  . Calls provider office for new concerns or questions . Performs some ADLs independently  Patient Goals Over the next 30 days, patient will: . Check blood sugar as instructed and if you feel it is too high or too low . Talk with endocrinologist to  see if there are any other options for managing blood sugar other than a daily injection of Basaglar . Talk with RN Care Manager and PCP/PharmD about blood sugar testing options . Eat an ADA/carb modified diet . Call PCP or endocrinologist with any blood sugar readings outside of recommended range . Call RN Care manager as needed [phone]    Follow Up Plan:  . Telephone follow up appointment with care management team member scheduled for: 10/07/20 with RNCM . The patient has been provided with contact information for the care management team and has been advised to call with any health related questions or concerns.  . Next appointment with endocrinologist scheduled for: 10/16/20 with Dr Dorris Fetch  [name], BSN, RN-BC [place] / [place] Management Direct Dial: [phone]

## 2020-10-07 ENCOUNTER — Encounter: Payer: Medicare Other | Admitting: *Deleted

## 2020-10-07 ENCOUNTER — Telehealth: Payer: Self-pay | Admitting: *Deleted

## 2020-10-07 DIAGNOSIS — E1151 Type 2 diabetes mellitus with diabetic peripheral angiopathy without gangrene: Secondary | ICD-10-CM | POA: Diagnosis not present

## 2020-10-07 DIAGNOSIS — B351 Tinea unguium: Secondary | ICD-10-CM | POA: Diagnosis not present

## 2020-10-07 DIAGNOSIS — M79676 Pain in unspecified toe(s): Secondary | ICD-10-CM | POA: Diagnosis not present

## 2020-10-07 DIAGNOSIS — L84 Corns and callosities: Secondary | ICD-10-CM | POA: Diagnosis not present

## 2020-10-07 NOTE — Telephone Encounter (Signed)
10/07/2020  Unsuccessful outreach to patient to update her on [employer] authorization. I spoke with Lottie Dawson, PharmD and the order was sent to [facility] and status is still pending. We expect a decision by the end of the week and I will call Ms Veale once we have received that.  [name], BSN, RN-BC Embedded Chronic Care Manager [facility] [place] Management Direct Dial: [phone]

## 2020-10-08 ENCOUNTER — Ambulatory Visit: Payer: Medicare Other | Admitting: Pharmacist

## 2020-10-08 DIAGNOSIS — Z794 Long term (current) use of insulin: Secondary | ICD-10-CM

## 2020-10-08 NOTE — Progress Notes (Signed)
Chronic Care Management   Follow Up Note   10/08/2020 Name: [name] MRN: [id number] DOB: [birth year]  Referred by: [name], DO Reason for referral : DIABETES    [name] is a 75 y.o. year old female who is a primary care patient of [name], DO. The CCM team was consulted for assistance with chronic disease management and care coordination needs.    Review of patient status, including review of consultants reports, relevant laboratory and other test results, and collaboration with appropriate care team members and the patient's provider was performed as part of comprehensive patient evaluation and provision of chronic care management services.    SDOH (Social Determinants of Health) assessments performed: No See Care Plan activities for detailed interventions related to [facility]     Outpatient Encounter Medications as of 10/08/2020  Medication Sig  . ACCU-CHEK AVIVA PLUS test strip CHECK BLOOD SUGAR UP TO 4 TIMES A DAY  . Accu-Chek Softclix Lancets lancets CHECK BLOOD SUGAR 2 TIMES A DAY  . amLODipine (NORVASC) 5 MG tablet Take 1 tablet (5 mg total) by mouth daily.  Marland Kitchen atorvastatin (LIPITOR) 10 MG tablet Take 1 tablet (10 mg total) by mouth daily.  . blood glucose meter kit and supplies Dispense based on patient and insurance preference. Use up to four times daily as directed. (FOR ICD-10 E10.9, E11.9).  . carvedilol (COREG) 12.5 MG tablet Take 1 tablet (12.5 mg total) by mouth 2 (two) times daily with a meal.  . Cholecalciferol 25 MCG (1000 UT) capsule Take 1 capsule (1,000 Units total) by mouth daily.  . colchicine 0.6 MG tablet Take 0.6 mg by mouth daily as needed.  . Febuxostat 80 MG TABS Take by mouth.  . fluticasone (FLONASE) 50 MCG/ACT nasal spray Place 2 sprays into both nostrils daily. Use 2 sprays in each nostril daily  . furosemide (LASIX) 40 MG tablet TAKE 1 TABLET TWICE WEEKLY  . Insulin Glargine (BASAGLAR KWIKPEN) 100 UNIT/ML Inject 8  Units into the skin daily.  Marland Kitchen ketorolac (ACULAR) 0.5 % ophthalmic solution Place 1 drop into the right eye every morning.  . Lancets Misc. MISC Use as directed to check blood sugar up to 4x daily. E11.65  . levothyroxine (SYNTHROID) 75 MCG tablet Take 1 tablet (75 mcg total) by mouth daily before breakfast.  . pantoprazole (PROTONIX) 20 MG tablet Take 1 tablet (20 mg total) by mouth daily.  . prednisoLONE acetate (PRED FORTE) 1 % ophthalmic suspension Place 1 drop into the right eye 4 (four) times daily.  Marland Kitchen ULTICARE MICRO PEN NEEDLES 32G X 4 MM MISC TWICE DAILY   No facility-administered encounter medications on file as of 10/08/2020.     Objective:   Goals Addressed              This Visit's Progress     Patient Stated   .  I would like to check my blood sugar (pt-stated)        CARE PLAN ENTRY (see longitudinal plan of care for additional care plan information)  Current Barriers:  . Social, financial, community barriers:  . Diabetes: R4YH; complicated by chronic medical conditions including HTN, CKD, HLD, most recent A1c 7.4% . Most recent eGFR: 29 . Current antihyperglycemic regimen: BASAGLAR 8 UNITS daily . Reports hypoglycemic symptoms (reports at nighttime)--dizziness, lightheadedness, shaking, sweating o Patient is eating a light snack before bedtime and denies any hypoglycemia since we last spoke . Denies hyperglycemic symptoms . Current exercise: n/a .  Current blood glucose readings: FBG 104, post-FBG 200s . Cardiovascular risk reduction: o Current hypertensive regimen:amlodipine, carvedilol, not on ACE/ARB o Current hyperlipidemia regimen: atorvastatin o Current antiplatelet regimen: n/a  Pharmacist Clinical Goal(s):  Marland Kitchen Over the next 90 days, patient will work with PharmD and primary care provider to address needs related to diabetes management  Interventions: . Comprehensive medication review performed, medication list updated in electronic medical  record . Inter-disciplinary care team collaboration (see longitudinal plan of care)--discussed case with CCM SW Scott . Highly encouraged patient to call endocrine with concerns of hypoglycemia and issues with checking blood sugar due to vision impairment . RX and clinical notes sent to [facility] via [facility] [facility] portal as a last attempt to get Libre 2 CGM covered  o Update 10/08/20-->[facility] - CGM/Libre o accepted order successfully. #[id number] o Will f/u on status daily and will let patient know once order is fufilled  Patient Self Care Activities:  . Patient will check blood glucose twice daily, document, and provide at future appointments . Patient will focus on medication adherence by continuing to take medications as prescribed . Patient will take medications as prescribed . Patient will contact provider with any episodes of hypoglycemia . Patient will report any questions or concerns to provider   Please see past updates related to this goal by clicking on the "Past Updates" button in the selected goal          Plan:   Telephone follow up appointment with care management team member scheduled for: 10/15/20   SIGNATURE Regina Eck, PharmD, BCPS Clinical Pharmacist, [place]  II Phone [phone]

## 2020-10-08 NOTE — Patient Instructions (Signed)
Visit Information  PATIENT GOALS: Goals Addressed              This Visit's Progress     Patient Stated   .  I would like to check my blood sugar (pt-stated)        CARE PLAN ENTRY (see longitudinal plan of care for additional care plan information)  Current Barriers:  . Social, financial, community barriers:  . Diabetes: W9KH; complicated by chronic medical conditions including HTN, CKD, HLD, most recent A1c 7.4% . Most recent eGFR: 29 . Current antihyperglycemic regimen: BASAGLAR 8 UNITS daily . Reports hypoglycemic symptoms (reports at nighttime)--dizziness, lightheadedness, shaking, sweating o Patient is eating a light snack before bedtime and denies any hypoglycemia since we last spoke . Denies hyperglycemic symptoms . Current exercise: n/a . Current blood glucose readings: FBG 104, post-FBG 200s . Cardiovascular risk reduction: o Current hypertensive regimen:amlodipine, carvedilol, not on ACE/ARB o Current hyperlipidemia regimen: atorvastatin o Current antiplatelet regimen: n/a  Pharmacist Clinical Goal(s):  Marland Kitchen Over the next 90 days, patient will work with PharmD and primary care provider to address needs related to diabetes management  Interventions: . Comprehensive medication review performed, medication list updated in electronic medical record . Inter-disciplinary care team collaboration (see longitudinal plan of care)--discussed case with CCM SW Scott . Highly encouraged patient to call endocrine with concerns of hypoglycemia and issues with checking blood sugar due to vision impairment . RX and clinical notes sent to [facility] via [employer] C[employer] portal as a last attempt to get Libre 2 CGM covered  o Update 10/08/20-->[facility] - CGM/Libre o accepted order successfully. #[id number] o Will f/u on status daily and will let patient know once order is fufilled  Patient Self Care Activities:  . Patient will check blood glucose twice daily, document,  and provide at future appointments . Patient will focus on medication adherence by continuing to take medications as prescribed . Patient will take medications as prescribed . Patient will contact provider with any episodes of hypoglycemia . Patient will report any questions or concerns to provider   Please see past updates related to this goal by clicking on the "Past Updates" button in the selected goal         The patient verbalized understanding of instructions, educational materials, and care plan provided today and declined offer to receive copy of patient instructions, educational materials, and care plan.   Telephone follow up appointment with care management team member scheduled for:  Signature Regina Eck, PharmD, BCPS Clinical Pharmacist, [place]  II Phone [phone]

## 2020-10-10 ENCOUNTER — Encounter: Payer: Self-pay | Admitting: *Deleted

## 2020-10-14 DIAGNOSIS — Z23 Encounter for immunization: Secondary | ICD-10-CM | POA: Diagnosis not present

## 2020-10-15 ENCOUNTER — Ambulatory Visit: Payer: Medicare Other | Admitting: *Deleted

## 2020-10-15 ENCOUNTER — Encounter: Payer: Self-pay | Admitting: "Endocrinology

## 2020-10-15 ENCOUNTER — Ambulatory Visit (INDEPENDENT_AMBULATORY_CARE_PROVIDER_SITE_OTHER): Payer: Medicare Other | Admitting: "Endocrinology

## 2020-10-15 ENCOUNTER — Other Ambulatory Visit: Payer: Self-pay

## 2020-10-15 VITALS — BP 148/72 | HR 64 | Ht 62.0 in | Wt 151.0 lb

## 2020-10-15 DIAGNOSIS — E034 Atrophy of thyroid (acquired): Secondary | ICD-10-CM | POA: Diagnosis not present

## 2020-10-15 DIAGNOSIS — E785 Hyperlipidemia, unspecified: Secondary | ICD-10-CM | POA: Diagnosis not present

## 2020-10-15 DIAGNOSIS — N183 Chronic kidney disease, stage 3 unspecified: Secondary | ICD-10-CM | POA: Diagnosis not present

## 2020-10-15 DIAGNOSIS — E1159 Type 2 diabetes mellitus with other circulatory complications: Secondary | ICD-10-CM | POA: Diagnosis not present

## 2020-10-15 DIAGNOSIS — M810 Age-related osteoporosis without current pathological fracture: Secondary | ICD-10-CM

## 2020-10-15 DIAGNOSIS — N1832 Chronic kidney disease, stage 3b: Secondary | ICD-10-CM

## 2020-10-15 DIAGNOSIS — F32A Depression, unspecified: Secondary | ICD-10-CM

## 2020-10-15 DIAGNOSIS — N189 Chronic kidney disease, unspecified: Secondary | ICD-10-CM | POA: Diagnosis not present

## 2020-10-15 DIAGNOSIS — E039 Hypothyroidism, unspecified: Secondary | ICD-10-CM | POA: Diagnosis not present

## 2020-10-15 DIAGNOSIS — E113499 Type 2 diabetes mellitus with severe nonproliferative diabetic retinopathy without macular edema, unspecified eye: Secondary | ICD-10-CM

## 2020-10-15 DIAGNOSIS — Z794 Long term (current) use of insulin: Secondary | ICD-10-CM | POA: Diagnosis not present

## 2020-10-15 DIAGNOSIS — I152 Hypertension secondary to endocrine disorders: Secondary | ICD-10-CM

## 2020-10-15 DIAGNOSIS — E1169 Type 2 diabetes mellitus with other specified complication: Secondary | ICD-10-CM

## 2020-10-15 DIAGNOSIS — E782 Mixed hyperlipidemia: Secondary | ICD-10-CM | POA: Diagnosis not present

## 2020-10-15 DIAGNOSIS — D631 Anemia in chronic kidney disease: Secondary | ICD-10-CM | POA: Diagnosis not present

## 2020-10-15 DIAGNOSIS — E1122 Type 2 diabetes mellitus with diabetic chronic kidney disease: Secondary | ICD-10-CM

## 2020-10-15 DIAGNOSIS — I10 Essential (primary) hypertension: Secondary | ICD-10-CM

## 2020-10-15 MED ORDER — LEVOTHYROXINE SODIUM 50 MCG PO TABS: 50.0000 ug | ORAL_TABLET | Freq: Every day | ORAL | 1 refills | Status: DC

## 2020-10-15 NOTE — Patient Instructions (Signed)
Visit Information  PATIENT GOALS: Goals Addressed            This Visit's Progress   . Manage my Diabetes   On track    Timeframe:  Long-Range Goal Priority:  Medium Start Date:  09/29/20                           Expected End Date:   09/29/21                      Follow Up Date 10/22/20   . Check blood sugar as instructed and if you feel it is too high or too low . Talk with endocrinologist to see if there are any other options for managing blood sugar other than a daily injection of Basaglar . Meet with PharmD to have [place] 2 setup and demonstrated . Eat an ADA/carb modified diet . Call PCP or endocrinologist with any blood sugar readings outside of recommended range . Call RN Care manager as needed [phone]   Why is this important?    Checking your blood sugar at home helps to keep it from getting very high or very low.   Writing the results in a diary or log helps the doctor know how to care for you.   Your blood sugar log should have the time, date and the results.   Also, write down the amount of insulin or other medicine that you take.   Other information, like what you ate, exercise done and how you were feeling, will also be helpful.     Notes:        The patient verbalized understanding of instructions, educational materials, and care plan provided today and declined offer to receive copy of patient instructions, educational materials, and care plan.   Follow Up Plan:  . Telephone follow up appointment with care management team member scheduled for: PharmD 10/22/20 . The patient has been provided with contact information for the care management team and has been advised to call with any health related questions or concerns.  . Next appointment with endocrinologist scheduled for: 10/15/20 with Dr Dorris Fetch  [name], BSN, RN-BC [place] / [place] Management Direct Dial: [phone]

## 2020-10-15 NOTE — Chronic Care Management (AMB) (Signed)
Chronic Care Management   CCM RN Visit Note  10/15/2020 Name: [name] MRN: [id number] DOB: Jan 20, 1946  Subjective: [name] is a 75 y.o. year old female who is a primary care patient of [name], DO. The care management team was consulted for assistance with disease management and care coordination needs.    Engaged with patient by telephone for follow up visit in response to provider referral for case management and/or care coordination services.   Consent to Services:  The patient was given information about Chronic Care Management services, agreed to services, and gave verbal consent prior to initiation of services.  Please see initial visit note for detailed documentation.   Patient agreed to services and verbal consent obtained.   Assessment: Review of patient past medical history, allergies, medications, health status, including review of consultants reports, laboratory and other test data, was performed as part of comprehensive evaluation and provision of chronic care management services.   SDOH (Social Determinants of Health) assessments and interventions performed:    CCM Care Plan  Allergies  Allergen Reactions  . Aspirin Nausea And Vomiting  . Ciprofloxacin Other (See Comments)    Upset Stomach  . Codeine Nausea And Vomiting    Makes me sick   . Losartan     Hyperkalemia   . Micardis [Telmisartan] Other (See Comments)    unknown  . Nexium [Esomeprazole Magnesium] Other (See Comments)    Causes internal bleeding  . Niaspan [Niacin Er] Other (See Comments)    Reaction is unknown  . Onglyza [Saxagliptin] Other (See Comments)    Reaction is unknown  . Other     No otc pain medications  . Rofecoxib Other (See Comments)    unknown  . Simvastatin   . Tequin [Gatifloxacin] Other (See Comments)    Reaction is unknown  . Welchol [Colesevelam Hcl]   . Amoxicillin Nausea And Vomiting and Rash    Has patient had a PCN reaction causing immediate  rash, facial/tongue/throat swelling, SOB or lightheadedness with hypotension: Yes Has patient had a PCN reaction causing severe rash involving mucus membranes or skin necrosis: no  Has patient had a PCN reaction that required hospitalization No Has patient had a PCN reaction occurring within the last 10 years: No If all of the above answers are "NO", then may proceed with Cephalosporin use.     Outpatient Encounter Medications as of 10/15/2020  Medication Sig  . ACCU-CHEK AVIVA PLUS test strip CHECK BLOOD SUGAR UP TO 4 TIMES A DAY  . Accu-Chek Softclix Lancets lancets CHECK BLOOD SUGAR 2 TIMES A DAY  . amLODipine (NORVASC) 5 MG tablet Take 1 tablet (5 mg total) by mouth daily.  Marland Kitchen atorvastatin (LIPITOR) 10 MG tablet Take 1 tablet (10 mg total) by mouth daily.  . blood glucose meter kit and supplies Dispense based on patient and insurance preference. Use up to four times daily as directed. (FOR ICD-10 E10.9, E11.9).  . carvedilol (COREG) 12.5 MG tablet Take 1 tablet (12.5 mg total) by mouth 2 (two) times daily with a meal.  . Cholecalciferol 25 MCG (1000 UT) capsule Take 1 capsule (1,000 Units total) by mouth daily.  . colchicine 0.6 MG tablet Take 0.6 mg by mouth daily as needed.  . Febuxostat 80 MG TABS Take by mouth.  . fluticasone (FLONASE) 50 MCG/ACT nasal spray Place 2 sprays into both nostrils daily. Use 2 sprays in each nostril daily  . furosemide (LASIX) 40 MG tablet TAKE 1 TABLET  TWICE WEEKLY  . Insulin Glargine (BASAGLAR KWIKPEN) 100 UNIT/ML Inject 8 Units into the skin daily.  Marland Kitchen ketorolac (ACULAR) 0.5 % ophthalmic solution Place 1 drop into the right eye every morning.  . Lancets Misc. MISC Use as directed to check blood sugar up to 4x daily. E11.65  . levothyroxine (SYNTHROID) 75 MCG tablet Take 1 tablet (75 mcg total) by mouth daily before breakfast.  . pantoprazole (PROTONIX) 20 MG tablet Take 1 tablet (20 mg total) by mouth daily.  . prednisoLONE acetate (PRED FORTE) 1 %  ophthalmic suspension Place 1 drop into the right eye 4 (four) times daily.  Marland Kitchen ULTICARE MICRO PEN NEEDLES 32G X 4 MM MISC TWICE DAILY   No facility-administered encounter medications on file as of 10/15/2020.    Patient Active Problem List   Diagnosis Date Noted  . Cystoid macular edema, right eye 01/14/2020  . Follow-up examination after eye surgery 11/01/2019  . Controlled type 2 diabetes mellitus with stable proliferative retinopathy of both eyes, with long-term current use of insulin ([place]) 10/02/2019  . 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 Retinal hemorrhage of left eye 10/02/2019  . Retinal microaneurysm of both eyes 10/02/2019  . Dislocated intraocular lens, initial encounter 10/02/2019  . Vitamin D deficiency 11/15/2017  . Foot ulcer ([place]) 06/02/2016  . 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 Cellulitis 05/05/2016  . Diabetic foot infection ([place]) 05/05/2016  . Nausea and vomiting 01/23/2015  . CKD (chronic kidney disease), stage III ([place])   . Anemia in chronic renal disease   . Gastroesophageal reflux disease without esophagitis 10/11/2014  . Hypothyroidism 05/18/2014  . Hip fracture requiring operative repair ([place])   . Fall   . Osteoporosis 09/06/2013  . Type 2 diabetes mellitus with stage 3b chronic kidney disease, with long-term current use of insulin ([place]) 07/20/2013  . Edema 03/03/2011  . Mixed hyperlipidemia   . Diabetic retinopathy ([place]) 10/28/2007  . Hyperlipidemia associated with type 2 diabetes mellitus ([place]) 10/28/2007  . Anxiety state 10/28/2007  . Depression 10/28/2007  . Essential hypertension 10/28/2007  . Disorder resulting from impaired renal function 10/28/2007  . COLONIC POLYPS, HYPERPLASTIC 11/17/2006  . DIVERTICULOSIS, COLON 11/17/2006    Conditions to be addressed/monitored:DMII and Depression  Care Plan : RNCM: Diabetes Type 2 (Adult)   Updates made by Ilean China, RN since 10/15/2020 12:00 AM    Problem: Glycemic Management (Diabetes, Type 2)   Priority: Medium    Long-Range Goal: Diabetes Management Optimized   Start Date: 09/29/2020  This Visit's Progress: On track  Recent Progress: On track  Priority: Medium  Note:   Current Barriers:  . 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  . Limited vision . Unable to independently read, drive, administer medications because of her limited vision . Other chronic conditions: depression, CKD  Nurse Case Manager Clinical Goal(s):  . patient will work with PCP/endocrinologist to address needs related to medical management of diabetes . patient will meet with RN Care Manager to address self-management of diabetes . Patient will meet with PharmD to setup Freestyle [place] 2  Interventions:  . 1:1 collaboration with Claretta Fraise, MD regarding development and update of comprehensive plan of care as evidenced by provider attestation and co-signature . Inter-disciplinary care team collaboration (see longitudinal plan of care) . Evaluation of current treatment plan related to diabetes and patient's adherence to plan as  established by provider. . Chart reviewed including relevant office notes and lab results . Previously assessed for family/social support o Her husband assists with insulin administration and blood sugar testing. He is in poor health and she is concerned about her ability to care for herself if he isn't able to assist. . Reviewed and discussed medications and importance of compliance . Collaborated with Lottie Dawson, PharmD regarding status of Freestyle [place] 2. It was approved and was shipped to [facility]. Marland Kitchen Advised patient that the Elenor Legato was approved and that Almyra Free can set it up and demonstrate how it works . Collaborated with Almyra Free to schedule patient an appointment for 10/22/20 . Reviewed upcoming  appointments . Encouraged patient to continue checking blood sugar as directed and to call endocrinologist or PCP with any readings outside of recommended range . Verified that patient has RN Care Manager contact information and encouraged to reach out as needed  Self Care Activities:  . Calls provider office for new concerns or questions . Performs some ADLs independently  Patient Goals Over the next 30 days, patient will: . Check blood sugar as instructed and if you feel it is too high or too low . Talk with endocrinologist to see if there are any other options for managing blood sugar other than a daily injection of Basaglar . Meet with PharmD to have [place] 2 setup and demonstrated . Eat an ADA/carb modified diet . Call PCP or endocrinologist with any blood sugar readings outside of recommended range . Call RN Care manager as needed [phone]     Follow Up Plan:  . Telephone follow up appointment with care management team member scheduled for: PharmD 10/22/20 . The patient has been provided with contact information for the care management team and has been advised to call with any health related questions or concerns.  . Next appointment with endocrinologist scheduled for: 10/15/20 with Dr Dorris Fetch  [name], BSN, RN-BC [place] [facility] Direct Dial: [phone]

## 2020-10-15 NOTE — Progress Notes (Signed)
10/15/2020   Endocrinology follow-up note   Subjective:    Patient ID: [name], female    DOB: [birth year].  She is being seen in follow-up in the management of uncontrolled type 2 diabetes, hypothyroidism, hypertension, hyperlipidemia.  Past Medical History:  Diagnosis Date  . Anal fistula   . Anemia in chronic renal disease    Aranesp injection --  when Hg <11, last injection 12-24-14.  Marland Kitchen Anxiety   . Aphakia of eye, right 10/02/2019   Condition resolved, status post vitrectomy, removal IOL, insertion Yamani scleral tunnel [facility] CT Lucio lens June 2021  . Arthritis    knees and hand/fingers. "broke back"-being evaluated for this"weakness left leg"  . Cataract    both eyes done  . 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 CKD (chronic kidney disease), stage III [facility])    nephrologist--  dr Mercy Moore-- [place]  07-09-2016  . Complication of anesthesia    post-op confusion   . Constipation   . Coronary artery disease   . Diabetic gastroparesis ([place])   . Diabetic retinopathy ([place])    bilateral --  monitored by dr [name]  . Diverticulosis of colon   . GAD (generalized anxiety disorder)   . GERD (gastroesophageal reflux disease)   . History of colon polyps    benign  . History of esophagitis   . History of GI bleed    upper 2009  due to esophagitis  &  2002  due to Mallory-Weiss tear  . History of hyperkalemia    pt had previously been canceled twice dos due to elevated K+, 07-29-2016 last date cancelled -- pt visited pcp same day treated w/ kayexelate and K+ came down, pt brought all her medication's in to pcp office and found pt was taking losartan that had been discontinued due to ckd, pcp stated this was cause of elevated K+  . 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 History of rectal abscess    12-29-2004  bedside I & D  . Hyperlipidemia   . Hypertension   . Hypothyroidism   . LAFB (left anterior fascicular block)   .  Right bundle branch block   . Sacral decubitus ulcer    since 2014- 01-02-15 remains with wound" gauze dressing changes daily.  . Type II diabetes mellitus ([place])    Past Surgical History:  Procedure Laterality Date  . APPLICATION OF A-CELL OF EXTREMITY N/A 04/07/2015   Procedure: A CELL PLACMENT;  Surgeon: Loel Lofty Dillingham, DO;  Location: [place];  Service: Plastics;  Laterality: N/A;  . CARDIOVASCULAR STRESS TEST  12-30-2004   normal perfusion study/  no ischemia or infartion/  normal LV wall function and wall motion , ef 66%  . CATARACT EXTRACTION W/ INTRAOCULAR LENS  IMPLANT, BILATERAL  1995  . COLONOSCOPY  2008   w/Dr.Brodie   . COLONOSCOPY W/ POLYPECTOMY  last one 2008  . COMPRESSION HIP SCREW Right 05/18/2014   Procedure: COMPRESSION HIP;  Surgeon: Carole Civil, MD;  Location: [facility];  Service: Orthopedics;  Laterality: Right;  . ESOPHAGOGASTRODUODENOSCOPY  last one 01-09-2011  . EVALUATION UNDER ANESTHESIA WITH FISTULECTOMY N/A 01/06/2015   Procedure: EXAM UNDER ANESTHESIA , placement of seton;  Surgeon: Jackolyn Confer, MD;  Location: [facility];  Service: General;  Laterality: N/A;  . I & D EXTREMITY N/A 04/07/2015   Procedure: IRRIGATION AND DEBRIDEMENT ISCHIAL ULCER;  Surgeon: Loel Lofty Dillingham, DO;  Location: [place];  Service: Plastics;  Laterality: N/A;  . INCISION AND DRAINAGE OF WOUND N/A 09/30/2014   Procedure: IRRIGATION AND DEBRIDEMENT SACRAL WOUND, EXCISION OF PERIRECTAL TRACT WITH PLACEMENT OF ACCELL;  Surgeon: Theodoro Kos, DO;  Location: [place];  Service: Plastics;  Laterality: N/A;  . LIGATION OF INTERNAL FISTULA TRACT N/A 10/22/2016   Procedure: LIGATION OF INTERNAL FISTULA TRACT;  Surgeon: Leighton Ruff, MD;  Location: [facility];  Service: General;  Laterality: N/A;  . ORIF FEMUR FRACTURE Left 10/09/2012   Procedure: OPEN REDUCTION INTERNAL FIXATION (ORIF) DISTAL FEMUR FRACTURE;  Surgeon: Rozanna Box, MD;  Location: [place];   Service: Orthopedics;  Laterality: Left;  . RETINOPATHY SURGERY Bilateral 1980's?  . TRANSTHORACIC ECHOCARDIOGRAM  02-18-2011   mild LVH,  ef 55-60%,  grade I diastolic dysfunction/  mild TR/  RV systolic pressure increased consistant with moderate pulmonary hypertension   Social History   Socioeconomic History  . Marital status: Married    Spouse name: [name]  . Number of children: 0  . Years of education: 90  . Highest education level: Not on file  Occupational History  . Occupation: Retired    Fish farm manager: RETIRED  Tobacco Use  . Smoking status: Never Smoker  . Smokeless tobacco: Never Used  Vaping Use  . Vaping Use: Never used  Substance and Sexual Activity  . Alcohol use: No  . Drug use: No  . Sexual activity: Not Currently  Other Topics Concern  . Not on file  Social History Narrative   Lives with husband.    Caffeine use: none   Social Determinants of Radio broadcast assistant Strain: Not on file  Food Insecurity: Not on file  Transportation Needs: Unmet Transportation Needs  . Lack of Transportation (Medical): Yes  . Lack of Transportation (Non-Medical): No  Physical Activity: Not on file  Stress: Not on file  Social Connections: Not on file   Outpatient Encounter Medications as of 10/15/2020  Medication Sig  . ACCU-CHEK AVIVA PLUS test strip CHECK BLOOD SUGAR UP TO 4 TIMES A DAY  . Accu-Chek Softclix Lancets lancets CHECK BLOOD SUGAR 2 TIMES A DAY  . amLODipine (NORVASC) 5 MG tablet Take 1 tablet (5 mg total) by mouth daily.  Marland Kitchen atorvastatin (LIPITOR) 10 MG tablet Take 1 tablet (10 mg total) by mouth daily.  . blood glucose meter kit and supplies Dispense based on patient and insurance preference. Use up to four times daily as directed. (FOR ICD-10 E10.9, E11.9).  . carvedilol (COREG) 12.5 MG tablet Take 1 tablet (12.5 mg total) by mouth 2 (two) times daily with a meal.  . Cholecalciferol 25 MCG (1000 UT) capsule Take 1 capsule (1,000 Units total) by mouth daily.   . colchicine 0.6 MG tablet Take 0.6 mg by mouth daily as needed.  . Febuxostat 80 MG TABS Take by mouth.  . fluticasone (FLONASE) 50 MCG/ACT nasal spray Place 2 sprays into both nostrils daily. Use 2 sprays in each nostril daily  . furosemide (LASIX) 40 MG tablet TAKE 1 TABLET TWICE WEEKLY  . Insulin Glargine (BASAGLAR KWIKPEN) 100 UNIT/ML Inject 8 Units into the skin daily.  Marland Kitchen ketorolac (ACULAR) 0.5 % ophthalmic solution Place 1 drop into the right eye every morning.  . Lancets Misc. MISC Use as directed to check blood sugar up to 4x daily. E11.65  . levothyroxine (SYNTHROID) 50 MCG tablet Take 1 tablet (50 mcg total) by mouth daily before breakfast.  . pantoprazole (  PROTONIX) 20 MG tablet Take 1 tablet (20 mg total) by mouth daily.  . prednisoLONE acetate (PRED FORTE) 1 % ophthalmic suspension Place 1 drop into the right eye 4 (four) times daily.  Marland Kitchen ULTICARE MICRO PEN NEEDLES 32G X 4 MM MISC TWICE DAILY  . [DISCONTINUED] levothyroxine (SYNTHROID) 75 MCG tablet Take 1 tablet (75 mcg total) by mouth daily before breakfast.   No facility-administered encounter medications on file as of 10/15/2020.   ALLERGIES: Allergies  Allergen Reactions  . Aspirin Nausea And Vomiting  . Ciprofloxacin Other (See Comments)    Upset Stomach  . Codeine Nausea And Vomiting    Makes me sick   . Losartan     Hyperkalemia   . Micardis [Telmisartan] Other (See Comments)    unknown  . Nexium [Esomeprazole Magnesium] Other (See Comments)    Causes internal bleeding  . Niaspan [Niacin Er] Other (See Comments)    Reaction is unknown  . Onglyza [Saxagliptin] Other (See Comments)    Reaction is unknown  . Other     No otc pain medications  . Rofecoxib Other (See Comments)    unknown  . Simvastatin   . Tequin [Gatifloxacin] Other (See Comments)    Reaction is unknown  . Welchol [Colesevelam Hcl]   . Amoxicillin Nausea And Vomiting and Rash    Has patient had a PCN reaction causing immediate rash,  facial/tongue/throat swelling, SOB or lightheadedness with hypotension: Yes Has patient had a PCN reaction causing severe rash involving mucus membranes or skin necrosis: no  Has patient had a PCN reaction that required hospitalization No Has patient had a PCN reaction occurring within the last 10 years: No If all of the above answers are "NO", then may proceed with Cephalosporin use.    VACCINATION STATUS: Immunization History  Administered Date(s) Administered  . Fluad Quad(high Dose 65+) 02/05/2019, 03/05/2020  . Influenza, High Dose Seasonal PF 03/05/2016, 03/14/2017, 03/01/2018  . Influenza,inj,Quad PF,6+ Mos 02/16/2013, 02/27/2015  . PFIZER(Purple Top)SARS-COV-2 Vaccination 06/13/2019, 07/04/2019, 03/05/2020  . Pneumococcal Conjugate-13 10/11/2014  . Pneumococcal Polysaccharide-23 10/10/2012  . Tdap 10/06/2012    Diabetes She presents for her follow-up diabetic visit. She has type 2 diabetes mellitus. Onset time: She was diagnosed at approximate age of 67 years . Her disease course has been improving. There are no hypoglycemic associated symptoms. Pertinent negatives for hypoglycemia include no confusion, headaches, pallor or seizures. Pertinent negatives for diabetes include no chest pain, no fatigue, no polydipsia, no polyphagia and no visual change. There are no hypoglycemic complications. Symptoms are improving. Diabetic complications include nephropathy, peripheral neuropathy, PVD and retinopathy. Risk factors for coronary artery disease include diabetes mellitus, dyslipidemia, hypertension, sedentary lifestyle and post-menopausal. Current diabetic treatment includes oral agent (monotherapy). She is compliant with treatment most of the time. Her weight is fluctuating minimally. She is following a generally unhealthy diet. When asked about meal planning, she reported none. She has not had a previous visit with a dietitian. She never participates in exercise. Her home blood glucose  trend is fluctuating minimally. Her breakfast blood glucose range is generally 130-140 mg/dl. Her bedtime blood glucose range is generally 140-180 mg/dl. Her overall blood glucose range is 140-180 mg/dl. (She presents with controlled glycemic profile.  Her A1c is 7.4% previsit labs.  This is general improvement from 8.1 during her last visit.  No major hypoglycemia was documented or reported.) An ACE inhibitor/angiotensin II receptor blocker is being taken. Eye exam is current.  Thyroid Problem Presents for initial visit.  Onset time: 15 years. Patient reports no cold intolerance, diarrhea, fatigue, heat intolerance, palpitations or visual change. The symptoms have been stable. Past treatments include levothyroxine. The following procedures have not been performed: thyroidectomy.  Hypertension This is a chronic problem. The current episode started more than 1 year ago. Pertinent negatives include no chest pain, headaches, palpitations or shortness of breath. Past treatments include angiotensin blockers. Hypertensive end-organ damage includes PVD and retinopathy. Identifiable causes of hypertension include a thyroid problem.    Review of systems  Constitutional: + Minimally fluctuating body weight,  current  Body mass index is 27.62 kg/m. , no fatigue, no subjective hyperthermia, no subjective hypothermia    Objective:    BP (!) 148/72   Pulse 64   Ht '5\' 2"'  (1.575 m)   Wt 151 lb (68.5 kg)   BMI 27.62 kg/m   Wt Readings from Last 3 Encounters:  10/15/20 151 lb (68.5 kg)  10/01/20 152 lb 6.4 oz (69.1 kg)  06/18/20 155 lb (70.3 kg)     Physical Exam- Limited  Constitutional:  Body mass index is 27.62 kg/m. , not in acute distress, normal state of mind    Results for orders placed or performed in visit on 10/01/20  Bayer DCA Hb A1c Waived  Result Value Ref Range   HB A1C (BAYER DCA - WAIVED) 7.4 (H) <7.0 %   Diabetic Labs (most recent): Lab Results  Component Value Date   HGBA1C  7.4 (H) 10/01/2020   HGBA1C 7.5 (H) 05/28/2020   HGBA1C 7.5 (H) 01/30/2020   Lipid Panel     Component Value Date/Time   CHOL 112 10/01/2020 0812   CHOL 105 09/11/2012 1057   TRIG 68 10/01/2020 0812   TRIG 170 (H) 10/11/2014 1159   TRIG 79 09/11/2012 1057   HDL 40 10/01/2020 0812   HDL 53 10/11/2014 1159   HDL 41 09/11/2012 1057   CHOLHDL 2.8 10/01/2020 0812   LDLCALC 58 10/01/2020 0812   LDLCALC 50 02/14/2014 1012   LDLCALC 48 09/11/2012 1057     Assessment & Plan:   1. Type 2 diabetes mellitus with stage 4 chronic kidney disease, without long-term current use of insulin ([place])  - Patient has currently controlled asymptomatic type 2 DM since  75 years of age.  She presents with controlled glycemic profile.  Her A1c is 7.4% previsit labs.  This is general improvement from 8.1 during her last visit.  No major hypoglycemia was documented or reported.    -her  diabetes is complicated by PAD, CKD, neuropathy and patient remains at a high risk for more acute and chronic complications of diabetes which include CAD, CVA, CKD, retinopathy, and neuropathy. These are all discussed in detail with the patient.  - I have counseled the patient on diet management  by adopting a carbohydrate restricted/protein rich diet.   - she acknowledges that there is a room for improvement in her food and drink choices. - Suggestion is made for her to avoid simple carbohydrates  from her diet including Cakes, Sweet Desserts, Ice Cream, Soda (diet and regular), Sweet Tea, Candies, Chips, Cookies, Store Bought Juices, Alcohol in Excess of  1-2 drinks a day, Artificial Sweeteners,  Coffee Creamer, and "Sugar-free" Products, Lemonade. This will help patient to have more stable blood glucose profile and potentially avoid unintended weight gain.  - I encouraged the patient to switch to  unprocessed or minimally processed complex starch and increased protein intake (animal or plant source), fruits, and  vegetables.  - Patient is advised to stick to a routine mealtimes to eat 3 meals  a day and avoid unnecessary snacks ( to snack only to correct hypoglycemia).   - I have approached patient with the following individualized plan to manage diabetes and patient agrees:    -Her husband, Ludwig Clarks , continues to offer to help. -She is advised to continue Basaglar 8 units every morning with breakfast associated with monitoring of blood glucose at least twice daily-daily before breakfast and at bedtime.    - First priority in this patient will be to avoid hypoglycemia.  She would benefit from a low-dose glipizide to help her control postprandial glycemia.  She has discontinued glipizide.   -Patient is encouraged to call clinic for blood glucose levels less than 70 or above 200 mg /dl.  -Patient is not a candidate for MTF, Incretin therapy.  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    - Patient specific target  A1c;  LDL, HDL, Triglycerides,  were discussed in detail.  2) BP/HTN:  Her blood pressure is not controlled to target.   She is advised to continue her current blood pressure medications including carvedilol and amlodipine.  She  has documented allergy to ARB.  3) Lipids/HPL: Her recent lipid panel continues to show improvement in her LDL at 58.she will continue to benefit from statin intervention.  She is advised to continue atorvastatin 10 mg p.o. nightly.  Side effects and precautions discussed with her.     4)  Weight/Diet: Her BMI is 27.6 - she is not a candidate for major weight loss.    CDE Consult has been initiated, she has limited ability to exercise.  5)  Hypothyroidism:  - Her current thyroid function tests are consistent with over replacement.  I discussed and lowered her levothyroxine to 50 mcg p.o. daily before breakfast.    - We discussed about the correct intake of her thyroid hormone, on empty stomach at fasting, with water, separated by at least 30 minutes from  breakfast and other medications,  and separated by more than 4 hours from calcium, iron, multivitamins, acid reflux medications (PPIs). -Patient is made aware of the fact that thyroid hormone replacement is needed for life, dose to be adjusted by periodic monitoring of thyroid function tests.   5) Chronic Care/Health Maintenance:  -Patient is  on ACEI/ARB and Statin medications and encouraged to continue to follow up with Ophthalmology, Podiatrist at least yearly or according to recommendations, and advised to   stay away from smoking. I have recommended yearly flu vaccine and pneumonia vaccination at least every 5 years;  and  sleep for at least 7 hours a day.  - I advised patient to maintain close follow up with [name], DO for primary care needs.    I spent 41 minutes in the care of the patient today including review of labs from [facility], Lipids, Thyroid Function, Hematology (current and previous including abstractions from other facilities); face-to-face time discussing  her blood glucose readings/logs, discussing hypoglycemia and hyperglycemia episodes and symptoms, medications doses, her options of short and long term treatment based on the latest standards of care / guidelines;  discussion about incorporating lifestyle medicine;  and documenting the encounter.    Please refer to Patient Instructions for Blood Glucose Monitoring and Insulin/Medications Dosing Guide"  in media tab for additional information. Please  also refer to " Patient Self Inventory" in the Media  tab for reviewed elements of pertinent patient  history.  [name] participated in the discussions, expressed understanding, and voiced agreement with the above plans.  All questions were answered to her satisfaction. she is encouraged to contact clinic should she have any questions or concerns prior to her return visit.  Follow up plan: - Return in about 6 months (around 04/17/2021) for F/U with Pre-visit Labs,  Meter, Logs, A1c here.Glade Lloyd, MD Phone: [phone]  Fax: [phone]  -  This note was partially dictated with voice recognition software. Similar sounding words can be transcribed inadequately or may not  be corrected upon review.  10/15/2020, 4:52 PM

## 2020-10-16 ENCOUNTER — Ambulatory Visit: Payer: Medicare Other | Admitting: "Endocrinology

## 2020-10-22 ENCOUNTER — Ambulatory Visit (INDEPENDENT_AMBULATORY_CARE_PROVIDER_SITE_OTHER): Payer: Medicare Other | Admitting: Pharmacist

## 2020-10-22 ENCOUNTER — Other Ambulatory Visit: Payer: Self-pay

## 2020-10-22 DIAGNOSIS — E1169 Type 2 diabetes mellitus with other specified complication: Secondary | ICD-10-CM | POA: Diagnosis not present

## 2020-10-22 DIAGNOSIS — M810 Age-related osteoporosis without current pathological fracture: Secondary | ICD-10-CM

## 2020-10-22 DIAGNOSIS — Z794 Long term (current) use of insulin: Secondary | ICD-10-CM | POA: Diagnosis not present

## 2020-10-22 DIAGNOSIS — N189 Chronic kidney disease, unspecified: Secondary | ICD-10-CM

## 2020-10-22 DIAGNOSIS — E785 Hyperlipidemia, unspecified: Secondary | ICD-10-CM

## 2020-10-22 DIAGNOSIS — D631 Anemia in chronic kidney disease: Secondary | ICD-10-CM | POA: Diagnosis not present

## 2020-10-22 DIAGNOSIS — N183 Chronic kidney disease, stage 3 unspecified: Secondary | ICD-10-CM

## 2020-10-22 DIAGNOSIS — I152 Hypertension secondary to endocrine disorders: Secondary | ICD-10-CM

## 2020-10-22 DIAGNOSIS — E1122 Type 2 diabetes mellitus with diabetic chronic kidney disease: Secondary | ICD-10-CM | POA: Diagnosis not present

## 2020-10-22 DIAGNOSIS — F32A Depression, unspecified: Secondary | ICD-10-CM

## 2020-10-22 DIAGNOSIS — E1159 Type 2 diabetes mellitus with other circulatory complications: Secondary | ICD-10-CM | POA: Diagnosis not present

## 2020-10-22 DIAGNOSIS — N1832 Chronic kidney disease, stage 3b: Secondary | ICD-10-CM

## 2020-10-22 NOTE — Patient Instructions (Addendum)
Visit Information  PATIENT GOALS: Goals Addressed              This Visit's Progress     Patient Stated   .  I would like to check my blood sugar (pt-stated)        CARE PLAN ENTRY (see longitudinal plan of care for additional care plan information)  Current Barriers:  . Social, financial, community barriers:  . Diabetes: K3TW; complicated by chronic medical conditions including HTN, CKD, HLD, most recent A1c 7.4% . Most recent eGFR: 32 . Current antihyperglycemic regimen: BASAGLAR 8 UNITS daily . Reports hypoglycemic symptoms (reports at nighttime)--dizziness, lightheadedness, shaking, sweating o Patient is eating a light snack before bedtime and denies any hypoglycemia since we last spoke . Denies hyperglycemic symptoms . Current exercise: n/a . Current blood glucose readings: FBG 104, post-FBG 200s . Cardiovascular risk reduction: o Current hypertensive regimen:amlodipine, carvedilol, not on ACE/ARB o Current hyperlipidemia regimen: atorvastatin o Current antiplatelet regimen: n/a  Pharmacist Clinical Goal(s):  Marland Kitchen Over the next 90 days, patient will work with PharmD and primary care provider to address needs related to diabetes management  Interventions: . Comprehensive medication review performed, medication list updated in electronic medical record . Inter-disciplinary care team collaboration (see longitudinal plan of care)--discussed case with CCM RN Drenda Freeze . Highly encouraged patient to call endocrine with concerns of hypoglycemia and issues with checking blood sugar due to vision impairment . RX and clinical notes sent to [facility] via [facility] CGM portal as a last attempt to get Libre 2 CGM covered  o Update 10/22/20-->[employer] [facility] [employer] - CGM/Libre o Delivered to PCP office [phone] o We applied CGM today to patient's left arm; husband was present for the visit.  Discussed how to trouble shoot and use the CGM system o Encouraged patient to bring CGM  to all of her appointments, especially endocrine  Patient Self Care Activities:  . Patient will check blood glucose twice daily, document, and provide at future appointments . Patient will focus on medication adherence by continuing to take medications as prescribed . Patient will take medications as prescribed . Patient will contact provider with any episodes of hypoglycemia . Patient will report any questions or concerns to provider   Please see past updates related to this goal by clicking on the "Past Updates" button in the selected goal         The patient verbalized understanding of instructions, educational materials, and care plan provided today and declined offer to receive copy of patient instructions, educational materials, and care plan.   Telephone follow up appointment with care management team member scheduled for: 10/30/20 with CCM RN  Regina Eck, PharmD, BCPS Clinical Pharmacist, [place]  [facility] Phone [phone]

## 2020-10-22 NOTE — Chronic Care Management (AMB) (Signed)
Erroneous encounter. Documented in telephone encounter.

## 2020-10-22 NOTE — Progress Notes (Signed)
Chronic Care Management   Follow Up Note   10/22/2020 Name: [name] MRN: [id number] DOB: April 12, 1946  Referred by: [name] Norlander, DO Reason for referral : Diabetes    [name] is a 75 y.o. year old female who is a primary care patient of [name] Norlander, DO. The CCM team was consulted for assistance with chronic disease management and care coordination needs.    Review of patient status, including review of consultants reports, relevant laboratory and other test results, and collaboration with appropriate care team members and the patient's provider was performed as part of comprehensive patient evaluation and provision of chronic care management services.    SDOH (Social Determinants of Health) assessments performed: No See Care Plan activities for detailed interventions related to [facility])     Outpatient Encounter Medications as of 10/22/2020  Medication Sig  . ACCU-CHEK AVIVA PLUS test strip CHECK BLOOD SUGAR UP TO 4 TIMES A DAY  . Accu-Chek Softclix Lancets lancets CHECK BLOOD SUGAR 2 TIMES A DAY  . amLODipine (NORVASC) 5 MG tablet Take 1 tablet (5 mg total) by mouth daily.  Marland Kitchen atorvastatin (LIPITOR) 10 MG tablet Take 1 tablet (10 mg total) by mouth daily.  . blood glucose meter kit and supplies Dispense based on patient and insurance preference. Use up to four times daily as directed. (FOR ICD-10 E10.9, E11.9).  . carvedilol (COREG) 12.5 MG tablet Take 1 tablet (12.5 mg total) by mouth 2 (two) times daily with a meal.  . Cholecalciferol 25 MCG (1000 UT) capsule Take 1 capsule (1,000 Units total) by mouth daily.  . colchicine 0.6 MG tablet Take 0.6 mg by mouth daily as needed.  . Febuxostat 80 MG TABS Take by mouth.  . fluticasone (FLONASE) 50 MCG/ACT nasal spray Place 2 sprays into both nostrils daily. Use 2 sprays in each nostril daily  . furosemide (LASIX) 40 MG tablet TAKE 1 TABLET TWICE WEEKLY  . Insulin Glargine (BASAGLAR KWIKPEN) 100 UNIT/ML Inject 8 Units  into the skin daily.  Marland Kitchen ketorolac (ACULAR) 0.5 % ophthalmic solution Place 1 drop into the right eye every morning.  . Lancets Misc. MISC Use as directed to check blood sugar up to 4x daily. E11.65  . levothyroxine (SYNTHROID) 50 MCG tablet Take 1 tablet (50 mcg total) by mouth daily before breakfast.  . pantoprazole (PROTONIX) 20 MG tablet Take 1 tablet (20 mg total) by mouth daily.  . prednisoLONE acetate (PRED FORTE) 1 % ophthalmic suspension Place 1 drop into the right eye 4 (four) times daily.  Marland Kitchen ULTICARE MICRO PEN NEEDLES 32G X 4 MM MISC TWICE DAILY   No facility-administered encounter medications on file as of 10/22/2020.     Objective:   Goals Addressed              This Visit's Progress     Patient Stated   .  I would like to check my blood sugar (pt-stated)        CARE PLAN ENTRY (see longitudinal plan of care for additional care plan information)  Current Barriers:  . Social, financial, community barriers:  . Diabetes: L4TG; complicated by chronic medical conditions including HTN, CKD, HLD, most recent A1c 7.4% . Most recent eGFR: 32 . Current antihyperglycemic regimen: BASAGLAR 8 UNITS daily . Reports hypoglycemic symptoms (reports at nighttime)--dizziness, lightheadedness, shaking, sweating o Patient is eating a light snack before bedtime and denies any hypoglycemia since we last spoke . Denies hyperglycemic symptoms . Current exercise: n/a .  Current blood glucose readings: FBG 104, post-FBG 200s . Cardiovascular risk reduction: o Current hypertensive regimen:amlodipine, carvedilol, not on ACE/ARB o Current hyperlipidemia regimen: atorvastatin o Current antiplatelet regimen: n/a  Pharmacist Clinical Goal(s):  Marland Kitchen Over the next 90 days, patient will work with PharmD and primary care provider to address needs related to diabetes management  Interventions: . Comprehensive medication review performed, medication list updated in electronic medical  record . Inter-disciplinary care team collaboration (see longitudinal plan of care)--discussed case with CCM RN Drenda Freeze . Highly encouraged patient to call endocrine with concerns of hypoglycemia and issues with checking blood sugar due to vision impairment . RX and clinical notes sent to [facility] via [employer] CGM portal as a last attempt to get Libre 2 CGM covered  o Update 10/22/20-->[employer] [facility] [employer] - CGM/Libre o Delivered to PCP office [phone] o We applied CGM today to patient's left arm; husband was present for the visit.  Discussed how to trouble shoot and use the CGM system o Encouraged patient to bring CGM to all of her appointments, especially endocrine  Patient Self Care Activities:  . Patient will check blood glucose twice daily, document, and provide at future appointments . Patient will focus on medication adherence by continuing to take medications as prescribed . Patient will take medications as prescribed . Patient will contact provider with any episodes of hypoglycemia . Patient will report any questions or concerns to provider   Please see past updates related to this goal by clicking on the "Past Updates" button in the selected goal          Plan:   Telephone follow up appointment with care management team member scheduled for: 10/30/20 with CCM RN   SIGNATURE  Regina Eck, PharmD, BCPS Clinical Pharmacist, [place]  II Phone [phone]

## 2020-10-23 ENCOUNTER — Telehealth: Payer: Self-pay | Admitting: Family Medicine

## 2020-10-23 NOTE — Telephone Encounter (Signed)
Pt has questions about her sugar machine.

## 2020-10-24 ENCOUNTER — Encounter (HOSPITAL_COMMUNITY): Payer: Self-pay | Admitting: *Deleted

## 2020-10-24 ENCOUNTER — Other Ambulatory Visit: Payer: Self-pay

## 2020-10-24 ENCOUNTER — Ambulatory Visit: Payer: Medicare Other | Admitting: Family Medicine

## 2020-10-24 DIAGNOSIS — E1122 Type 2 diabetes mellitus with diabetic chronic kidney disease: Secondary | ICD-10-CM

## 2020-10-24 DIAGNOSIS — N1832 Chronic kidney disease, stage 3b: Secondary | ICD-10-CM

## 2020-10-24 NOTE — Progress Notes (Signed)
Patient came in had another libre placed hers had malfunction. Patient tolerated well.

## 2020-10-24 NOTE — Telephone Encounter (Signed)
Please call patient about her machine.  Upset that she did not get a call yesterday.  Machine is not working.

## 2020-10-24 NOTE — Telephone Encounter (Signed)
Patient has made appointment to come in today to discuss with triage nurse.

## 2020-10-30 ENCOUNTER — Ambulatory Visit: Payer: Medicare Other | Admitting: Licensed Clinical Social Worker

## 2020-10-30 DIAGNOSIS — M810 Age-related osteoporosis without current pathological fracture: Secondary | ICD-10-CM

## 2020-10-30 DIAGNOSIS — F32A Depression, unspecified: Secondary | ICD-10-CM

## 2020-10-30 DIAGNOSIS — E1122 Type 2 diabetes mellitus with diabetic chronic kidney disease: Secondary | ICD-10-CM

## 2020-10-30 DIAGNOSIS — I152 Hypertension secondary to endocrine disorders: Secondary | ICD-10-CM

## 2020-10-30 DIAGNOSIS — D631 Anemia in chronic kidney disease: Secondary | ICD-10-CM

## 2020-10-30 DIAGNOSIS — E1169 Type 2 diabetes mellitus with other specified complication: Secondary | ICD-10-CM

## 2020-10-30 DIAGNOSIS — H353134 Nonexudative age-related macular degeneration, bilateral, advanced atrophic with subfoveal involvement: Secondary | ICD-10-CM

## 2020-10-30 DIAGNOSIS — N183 Chronic kidney disease, stage 3 unspecified: Secondary | ICD-10-CM

## 2020-10-30 DIAGNOSIS — F411 Generalized anxiety disorder: Secondary | ICD-10-CM

## 2020-10-30 DIAGNOSIS — E785 Hyperlipidemia, unspecified: Secondary | ICD-10-CM

## 2020-10-30 DIAGNOSIS — E1159 Type 2 diabetes mellitus with other circulatory complications: Secondary | ICD-10-CM

## 2020-10-30 NOTE — Patient Instructions (Signed)
Visit Information  PATIENT GOALS:  Goals Addressed               This Visit's Progress     Manage health needs faced; complete ADLs daily (pt-stated)        Timeframe:  Short-Term Goal Priority:  Medium Progress: On Track Start Date:       10/30/20                     Expected End Date:                      01/30/21  Follow Up Date  12/12/20   Protect My Health (Patient)   Manage Health needs and complete ADLs daily    Why is this important?   Screening tests can find diseases early when they are easier to treat.  Your doctor or nurse will talk with you about which tests are important for you.  Getting shots for common diseases like the flu and shingles will help prevent them.    Patient Activities  Listens to music Talks with family and friends via phone Communicates regularly with her spouse, [name]  Patient Coping Strengths: Takes medications as prescribed Attends scheduled medical appointments Has transport support from spouse, [name]  Patient Deficits:   Legally blind Mobility issues  Patient Goals:  Client to communicate with RNCM or LCSW as needed in next 30 days for CCM support Client to manage health needs faced and complete ADLs daily for next 30 days Communicate daily with spouse to discuss client needs -  Follow Up Plan:  LCSW  to call client on 12/12/20             [name].Zong Mcquarrie MSW, LCSW Licensed Clinical Social Worker [facility] Care Management [phone]

## 2020-10-30 NOTE — Chronic Care Management (AMB) (Signed)
Chronic Care Management    Clinical Social Work Note  10/30/2020 Name: [name] MRN: [id number] DOB: Oct 04, 1945  [name] is a 75 y.o. year old female who is a primary care patient of [name], DO. The CCM team was consulted to assist the patient with chronic disease management and/or care coordination needs related to: [name] .   Engaged with patient by telephone for follow up visit in response to provider referral for social work chronic care management and care coordination services.   Consent to Services:  The patient was given information about Chronic Care Management services, agreed to services, and gave verbal consent prior to initiation of services.  Please see initial visit note for detailed documentation.   Patient agreed to services and consent obtained.   Assessment: Review of patient past medical history, allergies, medications, and health status, including review of relevant consultants reports was performed today as part of a comprehensive evaluation and provision of chronic care management and care coordination services.     SDOH (Social Determinants of Health) assessments and interventions performed:  SDOH Interventions    Flowsheet Row Most Recent Value  SDOH Interventions   Depression Interventions/Treatment  --  [informed client of LCSW support and of RNCM support]        Advanced Directives Status: See [employer] application for related entries.  CCM Care Plan  Allergies  Allergen Reactions   Aspirin Nausea And Vomiting   Ciprofloxacin Other (See Comments)    Upset Stomach   Codeine Nausea And Vomiting    Makes me sick    Losartan     Hyperkalemia    Micardis [Telmisartan] Other (See Comments)    unknown   Nexium [Esomeprazole Magnesium] Other (See Comments)    Causes internal bleeding   Niaspan [Niacin Er] Other (See Comments)    Reaction is unknown   Onglyza [Saxagliptin] Other (See Comments)    Reaction is  unknown   Other     No otc pain medications   Rofecoxib Other (See Comments)    unknown   Simvastatin    Tequin [Gatifloxacin] Other (See Comments)    Reaction is unknown   Welchol [Colesevelam Hcl]    Amoxicillin Nausea And Vomiting and Rash    Has patient had a PCN reaction causing immediate rash, facial/tongue/throat swelling, SOB or lightheadedness with hypotension: Yes Has patient had a PCN reaction causing severe rash involving mucus membranes or skin necrosis: no  Has patient had a PCN reaction that required hospitalization No Has patient had a PCN reaction occurring within the last 10 years: No If all of the above answers are "NO", then may proceed with Cephalosporin use.     Outpatient Encounter Medications as of 10/30/2020  Medication Sig   ACCU-CHEK AVIVA PLUS test strip CHECK BLOOD SUGAR UP TO 4 TIMES A DAY   Accu-Chek Softclix Lancets lancets CHECK BLOOD SUGAR 2 TIMES A DAY   amLODipine (NORVASC) 5 MG tablet Take 1 tablet (5 mg total) by mouth daily.   atorvastatin (LIPITOR) 10 MG tablet Take 1 tablet (10 mg total) by mouth daily.   blood glucose meter kit and supplies Dispense based on patient and insurance preference. Use up to four times daily as directed. (FOR ICD-10 E10.9, E11.9).   carvedilol (COREG) 12.5 MG tablet Take 1 tablet (12.5 mg total) by mouth 2 (two) times daily with a meal.   Cholecalciferol 25 MCG (1000 UT) capsule Take 1 capsule (1,000 Units total) by mouth  daily.   colchicine 0.6 MG tablet Take 0.6 mg by mouth daily as needed.   Febuxostat 80 MG TABS Take by mouth.   fluticasone (FLONASE) 50 MCG/ACT nasal spray Place 2 sprays into both nostrils daily. Use 2 sprays in each nostril daily   furosemide (LASIX) 40 MG tablet TAKE 1 TABLET TWICE WEEKLY   Insulin Glargine (BASAGLAR KWIKPEN) 100 UNIT/ML Inject 8 Units into the skin daily.   ketorolac (ACULAR) 0.5 % ophthalmic solution Place 1 drop into the right eye every morning.   Lancets Misc. MISC Use as  directed to check blood sugar up to 4x daily. E11.65   levothyroxine (SYNTHROID) 50 MCG tablet Take 1 tablet (50 mcg total) by mouth daily before breakfast.   pantoprazole (PROTONIX) 20 MG tablet Take 1 tablet (20 mg total) by mouth daily.   prednisoLONE acetate (PRED FORTE) 1 % ophthalmic suspension Place 1 drop into the right eye 4 (four) times daily.   ULTICARE MICRO PEN NEEDLES 32G X 4 MM MISC TWICE DAILY   No facility-administered encounter medications on file as of 10/30/2020.    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 10/02/2019   Retinal hemorrhage of left eye 10/02/2019   Retinal microaneurysm of both eyes 10/02/2019   Dislocated intraocular lens, initial encounter 10/02/2019   Vitamin D deficiency 11/15/2017   Foot ulcer ([place]) 06/02/2016   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 05/18/2016   Cellulitis 05/05/2016   Diabetic foot infection ([place]) 05/05/2016   Nausea and vomiting 01/23/2015   CKD (chronic kidney disease), stage III (HCC)    Anemia in chronic renal disease    Gastroesophageal reflux disease without esophagitis 10/11/2014   Hypothyroidism 05/18/2014   Hip fracture requiring operative repair [facility])    Fall    Osteoporosis 09/06/2013   Type 2 diabetes mellitus with stage 3b chronic kidney disease, with long-term current use of insulin ([place]) 07/20/2013   Edema 03/03/2011   Mixed hyperlipidemia    Diabetic retinopathy ([place]) 10/28/2007   Hyperlipidemia associated with type 2 diabetes mellitus ([place]) 10/28/2007   Anxiety state 10/28/2007   Depression 10/28/2007   Essential hypertension 10/28/2007    Disorder resulting from impaired renal function 10/28/2007   COLONIC POLYPS, HYPERPLASTIC 11/17/2006   DIVERTICULOSIS, COLON 11/17/2006    Conditions to be addressed/monitored: Monitor client completion of daily ADLs; monitor vision needs of client  Care Plan : [place]  Updates made by Katha Cabal, LCSW since 10/30/2020 12:00 AM     Problem: Coping Skills (General Plan of Care)      Goal: Manage medical needs and complete ADLs daily   Start Date: 10/30/2020  Expected End Date: 01/30/2021  This Visit's Progress: On track  Recent Progress: On track  Priority: Medium  Note:   Current barriers:   Patient in need of assistance with connecting to community resources for assisting with vision needs and helping client complete ADLs Patient is unable to independently navigate community resource options without care coordination support Legally Blind Mobility issues  Clinical Goals:  LCSW to communicate with client in next 30 days to discuss client management of medical needs and to discuss client completion  of daily ADLs Client to attend scheduled medical appointments in next 30 days Client to communicate with RNCM or LCSW as needed in next 30 days  Clinical Interventions:  Collaboration with [name], DO regarding development and update of comprehensive plan of care as evidenced by provider attestation and co-signature Assessment of needs, barriers of client Talked with client about her CGM and machine that reads out sugar level, as needed to client Talked with client about mobility of client (uses a cane to help her walk) Talked with client about pain issues of client Talked with client about support from [facility] (client said she received a watch, two magnifiers, flashlight, ink pens, markers for microwave, writing tablet). Client has talked with Glenard Haring, [facility] [employer] representative Talked with client about medications of  client Talked with client about sleeping issues of client Talked with client about ADLs completion Talked with client about medical needs management.  (Spouse is involved and helps client with daily needs) Talked with client about food procurement of client (her spouse cooks daily for client and spouse at their home) Talked with client about transport support for client  Talked with client about relaxation activities (likes to watch TV, listen to music) Talked with client about upcoming client medical appointments Talked with client about her management of diabetes Talked with client about mood of client Encouraged client to call RNCM as needed for nursing support Talked with client about upcoming client appointments (Client said she sees Dr. Irving Shows, podiatrist as scheduled; she said she sees Dr. [name] for eye management in needs of her eyes) Collaborated with RNCM regarding nursing needs of client  Patient Activities  Listens to music Talks with family and friends via phone Communicates regularly with her spouse, [name]  Patient Coping Strengths: Takes medications as prescribed Attends scheduled medical appointments Has transport support from spouse, [name]  Patient Deficits:   Legally blind Mobility issues  Patient Goals:  Client to communicate with RNCM or LCSW as needed in next 30 days for CCM support Client to manage health needs faced and complete ADLs daily for next 30 days Communicate daily with spouse to discuss client needs -  Follow Up Plan:  LCSW  to call client on 12/12/20     [name].Damario Gillie MSW, LCSW Licensed Clinical Social Worker [facility] Care Management [phone]

## 2020-11-13 ENCOUNTER — Encounter: Payer: Self-pay | Admitting: *Deleted

## 2020-11-13 ENCOUNTER — Ambulatory Visit: Payer: Medicare Other | Admitting: *Deleted

## 2020-11-13 DIAGNOSIS — Z794 Long term (current) use of insulin: Secondary | ICD-10-CM | POA: Diagnosis not present

## 2020-11-13 DIAGNOSIS — N1832 Chronic kidney disease, stage 3b: Secondary | ICD-10-CM | POA: Diagnosis not present

## 2020-11-13 DIAGNOSIS — N183 Chronic kidney disease, stage 3 unspecified: Secondary | ICD-10-CM | POA: Diagnosis not present

## 2020-11-13 DIAGNOSIS — F411 Generalized anxiety disorder: Secondary | ICD-10-CM

## 2020-11-13 DIAGNOSIS — E1169 Type 2 diabetes mellitus with other specified complication: Secondary | ICD-10-CM | POA: Diagnosis not present

## 2020-11-13 DIAGNOSIS — N189 Chronic kidney disease, unspecified: Secondary | ICD-10-CM | POA: Diagnosis not present

## 2020-11-13 DIAGNOSIS — D631 Anemia in chronic kidney disease: Secondary | ICD-10-CM | POA: Diagnosis not present

## 2020-11-13 DIAGNOSIS — F32A Depression, unspecified: Secondary | ICD-10-CM

## 2020-11-13 DIAGNOSIS — I152 Hypertension secondary to endocrine disorders: Secondary | ICD-10-CM | POA: Diagnosis not present

## 2020-11-13 DIAGNOSIS — E785 Hyperlipidemia, unspecified: Secondary | ICD-10-CM | POA: Diagnosis not present

## 2020-11-13 DIAGNOSIS — M810 Age-related osteoporosis without current pathological fracture: Secondary | ICD-10-CM | POA: Diagnosis not present

## 2020-11-13 DIAGNOSIS — H353132 Nonexudative age-related macular degeneration, bilateral, intermediate dry stage: Secondary | ICD-10-CM

## 2020-11-13 DIAGNOSIS — E1122 Type 2 diabetes mellitus with diabetic chronic kidney disease: Secondary | ICD-10-CM

## 2020-11-13 DIAGNOSIS — E1159 Type 2 diabetes mellitus with other circulatory complications: Secondary | ICD-10-CM | POA: Diagnosis not present

## 2020-11-13 NOTE — Patient Instructions (Signed)
Visit Information  PATIENT GOALS:  Goals Addressed             This Visit's Progress    Manage my Diabetes   On track    Timeframe:  Long-Range Goal Priority:  Medium Start Date:  09/29/20                           Expected End Date:   09/29/21                      Follow Up Date 12/18/20   Check blood sugar with CGM as instructed and if you feel it is too high or too low Eat an ADA/carb modified diet Call PCP or endocrinologist with any blood sugar readings outside of recommended range Keep all medical appointments Take all medications as directed Call RN Care manager as needed [phone]   Why is this important?   Checking your blood sugar at home helps to keep it from getting very high or very low.  Writing the results in a diary or log helps the doctor know how to care for you.  Your blood sugar log should have the time, date and the results.  Also, write down the amount of insulin or other medicine that you take.  Other information, like what you ate, exercise done and how you were feeling, will also be helpful.     Notes:          The patient verbalized understanding of instructions, educational materials, and care plan provided today and declined offer to receive copy of patient instructions, educational materials, and care plan.   Telephone follow up appointment with care management team member scheduled for: 12/18/20 with RNCM  [name], BSN, RN-BC [place] [facility] [place] Management Direct Dial: [phone]

## 2020-11-13 NOTE — Chronic Care Management (AMB) (Signed)
Chronic Care Management   CCM RN Visit Note  11/13/2020 Name: [name] MRN: [id number] DOB: 16-May-1946  Subjective: [name] is a 75 y.o. year old female who is a primary care patient of [name], DO. The care management team was consulted for assistance with disease management and care coordination needs.    Engaged with patient by telephone for follow up visit in response to provider referral for case management and/or care coordination services.   Consent to Services:  The patient was given information about Chronic Care Management services, agreed to services, and gave verbal consent prior to initiation of services.  Please see initial visit note for detailed documentation.   Patient agreed to services and verbal consent obtained.   Assessment: Review of patient past medical history, allergies, medications, health status, including review of consultants reports, laboratory and other test data, was performed as part of comprehensive evaluation and provision of chronic care management services.   SDOH (Social Determinants of Health) assessments and interventions performed:    CCM Care Plan  Allergies  Allergen Reactions   Aspirin Nausea And Vomiting   Ciprofloxacin Other (See Comments)    Upset Stomach   Codeine Nausea And Vomiting    Makes me sick    Losartan     Hyperkalemia    Micardis [Telmisartan] Other (See Comments)    unknown   Nexium [Esomeprazole Magnesium] Other (See Comments)    Causes internal bleeding   Niaspan [Niacin Er] Other (See Comments)    Reaction is unknown   Onglyza [Saxagliptin] Other (See Comments)    Reaction is unknown   Other     No otc pain medications   Rofecoxib Other (See Comments)    unknown   Simvastatin    Tequin [Gatifloxacin] Other (See Comments)    Reaction is unknown   Welchol [Colesevelam Hcl]    Amoxicillin Nausea And Vomiting and Rash    Has patient had a PCN reaction causing immediate rash,  facial/tongue/throat swelling, SOB or lightheadedness with hypotension: Yes Has patient had a PCN reaction causing severe rash involving mucus membranes or skin necrosis: no  Has patient had a PCN reaction that required hospitalization No Has patient had a PCN reaction occurring within the last 10 years: No If all of the above answers are "NO", then may proceed with Cephalosporin use.     Outpatient Encounter Medications as of 11/13/2020  Medication Sig   ACCU-CHEK AVIVA PLUS test strip CHECK BLOOD SUGAR UP TO 4 TIMES A DAY   Accu-Chek Softclix Lancets lancets CHECK BLOOD SUGAR 2 TIMES A DAY   amLODipine (NORVASC) 5 MG tablet Take 1 tablet (5 mg total) by mouth daily.   atorvastatin (LIPITOR) 10 MG tablet Take 1 tablet (10 mg total) by mouth daily.   blood glucose meter kit and supplies Dispense based on patient and insurance preference. Use up to four times daily as directed. (FOR ICD-10 E10.9, E11.9).   carvedilol (COREG) 12.5 MG tablet Take 1 tablet (12.5 mg total) by mouth 2 (two) times daily with a meal.   Cholecalciferol 25 MCG (1000 UT) capsule Take 1 capsule (1,000 Units total) by mouth daily.   colchicine 0.6 MG tablet Take 0.6 mg by mouth daily as needed.   Febuxostat 80 MG TABS Take by mouth.   fluticasone (FLONASE) 50 MCG/ACT nasal spray Place 2 sprays into both nostrils daily. Use 2 sprays in each nostril daily   furosemide (LASIX) 40 MG tablet TAKE 1 TABLET  TWICE WEEKLY   Insulin Glargine (BASAGLAR KWIKPEN) 100 UNIT/ML Inject 8 Units into the skin daily.   ketorolac (ACULAR) 0.5 % ophthalmic solution Place 1 drop into the right eye every morning.   Lancets Misc. MISC Use as directed to check blood sugar up to 4x daily. E11.65   levothyroxine (SYNTHROID) 50 MCG tablet Take 1 tablet (50 mcg total) by mouth daily before breakfast.   pantoprazole (PROTONIX) 20 MG tablet Take 1 tablet (20 mg total) by mouth daily.   prednisoLONE acetate (PRED FORTE) 1 % ophthalmic suspension Place  1 drop into the right eye 4 (four) times daily.   ULTICARE MICRO PEN NEEDLES 32G X 4 MM MISC TWICE DAILY   No facility-administered encounter medications on file as of 11/13/2020.    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 10/02/2019   Retinal hemorrhage of left eye 10/02/2019   Retinal microaneurysm of both eyes 10/02/2019   Dislocated intraocular lens, initial encounter 10/02/2019   Vitamin D deficiency 11/15/2017   Foot ulcer ([place]) 06/02/2016   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 05/18/2016   Cellulitis 05/05/2016   Diabetic foot infection ([place]) 05/05/2016   Nausea and vomiting 01/23/2015   CKD (chronic kidney disease), stage III (HCC)    Anemia in chronic renal disease    Gastroesophageal reflux disease without esophagitis 10/11/2014   Hypothyroidism 05/18/2014   Hip fracture requiring operative repair [facility])    Fall    Osteoporosis 09/06/2013   Type 2 diabetes mellitus with stage 3b chronic kidney disease, with long-term current use of insulin ([place]) 07/20/2013   Edema 03/03/2011   Mixed hyperlipidemia    Diabetic retinopathy ([place]) 10/28/2007   Hyperlipidemia associated with type 2 diabetes mellitus ([place]) 10/28/2007   Anxiety state 10/28/2007   Depression 10/28/2007   Essential hypertension 10/28/2007   Disorder resulting from impaired renal function 10/28/2007   COLONIC POLYPS, HYPERPLASTIC 11/17/2006   DIVERTICULOSIS, COLON 11/17/2006    Conditions to be addressed/monitored:DMII, CKD Stage 3, and very limited vision due to bilateral macular degeneration  Care Plan : RNCM: Diabetes Type 2 (Adult)   Updates made by Ilean China, RN since 11/13/2020 12:00 AM     Problem: Glycemic Management (Diabetes, Type 2)   Priority: Medium     Long-Range Goal: Diabetes Management Optimized   Start Date: 09/29/2020  This Visit's Progress: On track  Recent Progress: On track  Priority: Medium  Note:   Current Barriers:  Chronic Disease Management support and education needs related to diabetes Financial Constraints.  Limited vision Unable to independently read, drive, administer medications because of her limited vision Other chronic conditions: anxiety, depression, CKD, macular degeneration  Nurse Case Manager Clinical Goal(s):  patient will work with PCP/endocrinologist to address needs related to medical management of diabetes patient will meet with RN Care Manager to address self-management of diabetes Patient will use CGM to continuously monitor blood sugar  Interventions:  1:1 collaboration with Claretta Fraise, MD regarding development and update of comprehensive plan of care as evidenced by provider attestation and co-signature Inter-disciplinary care team collaboration (see longitudinal plan of care) Evaluation of current treatment plan related to diabetes and patient's adherence  to plan as established by provider. Chart reviewed including relevant office notes and lab results Consulted by LCSW regarding Freestyle Libre 2 Assessed working knowledge of CGM and patient's comfort with using it to monitor blood sugar Patient is very happy with this system because it verbally announces her blood sugar and has alarms Therapeutic listening utilized Discussed decrease in health anxiety related to managing diabetes with very limited vision since obtaining the [employer] 2 and training on usage Patient feels less anxiety and more health independence  Reviewed upcoming appointments with PCP and endocrinologist  Encouraged patient to continue checking blood sugar as directed and to call  endocrinologist or PCP with any readings outside of recommended range Verified that patient has RN Care Manager contact information and encouraged to reach out as needed  Self Care Activities:  Calls provider office for new concerns or questions Performs some ADLs independently  Patient Goals Over the next 90 days, patient will: Check blood sugar with CGM as instructed and if you feel it is too high or too low Eat an ADA/carb modified diet Call PCP or endocrinologist with any blood sugar readings outside of recommended range Keep all medical appointments Take all medications as directed Call RN Care manager as needed [phone]  Follow Up Plan:  Telephone follow up appointment with care management team member scheduled for: 12/18/20 with RNCM     [name], BSN, RN-BC [place] / [place] Management Direct Dial: [phone]

## 2020-11-20 DIAGNOSIS — M109 Gout, unspecified: Secondary | ICD-10-CM | POA: Diagnosis not present

## 2020-11-20 DIAGNOSIS — I129 Hypertensive chronic kidney disease with stage 1 through stage 4 chronic kidney disease, or unspecified chronic kidney disease: Secondary | ICD-10-CM | POA: Diagnosis not present

## 2020-11-20 DIAGNOSIS — E1122 Type 2 diabetes mellitus with diabetic chronic kidney disease: Secondary | ICD-10-CM | POA: Diagnosis not present

## 2020-11-20 DIAGNOSIS — N2581 Secondary hyperparathyroidism of renal origin: Secondary | ICD-10-CM | POA: Diagnosis not present

## 2020-11-20 DIAGNOSIS — D631 Anemia in chronic kidney disease: Secondary | ICD-10-CM | POA: Diagnosis not present

## 2020-11-20 DIAGNOSIS — N183 Chronic kidney disease, stage 3 unspecified: Secondary | ICD-10-CM | POA: Diagnosis not present

## 2020-11-20 DIAGNOSIS — N189 Chronic kidney disease, unspecified: Secondary | ICD-10-CM | POA: Diagnosis not present

## 2020-11-20 DIAGNOSIS — R809 Proteinuria, unspecified: Secondary | ICD-10-CM | POA: Diagnosis not present

## 2020-11-21 ENCOUNTER — Ambulatory Visit (INDEPENDENT_AMBULATORY_CARE_PROVIDER_SITE_OTHER): Payer: Medicare Other | Admitting: Pharmacist

## 2020-11-21 ENCOUNTER — Telehealth: Payer: Self-pay | Admitting: Family Medicine

## 2020-11-21 DIAGNOSIS — N183 Chronic kidney disease, stage 3 unspecified: Secondary | ICD-10-CM

## 2020-11-21 NOTE — Telephone Encounter (Signed)
See pharmacy note

## 2020-11-21 NOTE — Telephone Encounter (Signed)
Attempted to contact - NA 

## 2020-11-21 NOTE — Progress Notes (Signed)
Chronic Care Management Pharmacy Note  11/21/2020 Name:  [name] MRN:  [id number] DOB:  [birth year]  Summary: DIABETES MANAGEMENT, LIBRE 2 CGM TROUBLESHOOTING  Recommendations/Changes made from today's visit: Diabetes: Uncontrolled; current treatment:BASAGLAR 8 UNITS;  Libre 2 CGM system supplied by [employer] via parachute portal Discussed how to troubleshoot and use the LIBRE 2 CGM system--walked patient through set up today, sensor to read in 60 minutes, if sensor falls off, do not replace Encouraged patient to bring CGM to all of her appointments, especially endocrine NO hypoglycemic symptoms OR HYPOGLYCEMIA REPORTED PER LIBRE Patient is eating a light snack before bedtime and denies any hypoglycemia since we last spoke  Plan: F/U IN 3 WEEKS PHARMD  Subjective: [name] is an 75 y.o. year old female who is a primary patient of [name], DO.  The CCM team was consulted for assistance with disease management and care coordination needs.    Engaged with patient by telephone for follow up visit in response to provider referral for pharmacy case management and/or care coordination services.   Consent to Services:  The patient was given information about Chronic Care Management services, agreed to services, and gave verbal consent prior to initiation of services.  Please see initial visit note for detailed documentation.   Patient Care Team: [name], DO as PCP - General (Family Medicine) Hurman Horn, MD as Consulting Physician (Ophthalmology) Fleet Contras, MD as Consulting Physician (Nephrology) Minus Breeding, MD as Consulting Physician (Cardiology) Lafayette Dragon, MD (Inactive) as Consulting Physician (Gastroenterology) Ilean China, RN as Case Manager Katha Cabal, LCSW as [place] [facility] (Licensed Clinical Social Worker) Lavera Guise, [facility] (Pharmacist)  Objective:  Lab Results   Component Value Date   CREATININE 1.68 (H) 10/01/2020   CREATININE 1.73 (H) 05/28/2020   CREATININE 1.53 (H) 09/17/2019    Lab Results  Component Value Date   HGBA1C 7.4 (H) 10/01/2020   Last diabetic Eye exam:  Lab Results  Component Value Date/Time   HMDIABEYEEXA Retinopathy (A) 12/26/2018 12:00 AM    Last diabetic Foot exam: No results found for: HMDIABFOOTEX      Component Value Date/Time   CHOL 112 10/01/2020 0812   CHOL 105 09/11/2012 1057   TRIG 68 10/01/2020 0812   TRIG 170 (H) 10/11/2014 1159   TRIG 79 09/11/2012 1057   HDL 40 10/01/2020 0812   HDL 53 10/11/2014 1159   HDL 41 09/11/2012 1057   CHOLHDL 2.8 10/01/2020 0812   [place] 58 10/01/2020 0812   [place] 50 02/14/2014 1012   [place] 48 09/11/2012 1057    Hepatic Function Latest Ref Rng & Units 10/01/2020 05/28/2020 09/17/2019  Total Protein 6.0 - 8.5 g/dL 6.5 - 6.9  Albumin 3.7 - 4.7 g/dL 4.1 4.1 4.3  AST 0 - 40 IU/L 11 - 13  ALT 0 - 32 IU/L <5 - 4  Alk Phosphatase 44 - 121 IU/L 90 - 107  Total Bilirubin 0.0 - 1.2 mg/dL 0.4 - 0.4  Bilirubin, Direct 0.0 - 0.3 mg/dL - - -    Lab Results  Component Value Date/Time   TSH 0.904 10/01/2020 08:12 AM   TSH 0.988 01/30/2020 11:28 AM   FREET4 1.79 (H) 10/01/2020 08:12 AM   FREET4 1.17 11/21/2015 11:00 AM    CBC Latest Ref Rng & Units 07/29/2016 07/29/2016 06/25/2016  WBC 4.0 - 10.5 K/uL - - 9.4  Hemoglobin 12.0 - 15.0 g/dL 12.6 13.3  10.8(L)  Hematocrit 36.0 - 46.0 % 37.0 39.0 32.7(L)  Platelets 150 - 400 K/uL - - 192    Lab Results  Component Value Date/Time   VD25OH 25.6 (L) 10/01/2020 08:12 AM   VD25OH 13.8 (L) 10/20/2017 08:30 AM    Clinical ASCVD: No  The ASCVD Risk score Mikey Bussing DC Jr., et al., 2013) failed to calculate for the following reasons:   The valid total cholesterol range is 130 to 320 mg/dL    Other: (CHADS2VASc if Afib, PHQ9 if depression, MMRC or CAT for COPD, ACT, DEXA)  Social History   Tobacco Use  Smoking Status Never   Smokeless Tobacco Never   BP Readings from Last 3 Encounters:  10/15/20 (!) 148/72  10/01/20 137/60  06/18/20 (!) 160/76   Pulse Readings from Last 3 Encounters:  10/15/20 64  10/01/20 (!) 54  06/18/20 69   Wt Readings from Last 3 Encounters:  10/15/20 151 lb (68.5 kg)  10/01/20 152 lb 6.4 oz (69.1 kg)  06/18/20 155 lb (70.3 kg)    Assessment: Review of patient past medical history, allergies, medications, health status, including review of consultants reports, laboratory and other test data, was performed as part of comprehensive evaluation and provision of chronic care management services.   SDOH:  (Social Determinants of Health) assessments and interventions performed:    CCM Care Plan  Allergies  Allergen Reactions   Aspirin Nausea And Vomiting   Ciprofloxacin Other (See Comments)    Upset Stomach   Codeine Nausea And Vomiting    Makes me sick    Losartan     Hyperkalemia    Micardis [Telmisartan] Other (See Comments)    unknown   Nexium [Esomeprazole Magnesium] Other (See Comments)    Causes internal bleeding   Niaspan [Niacin Er] Other (See Comments)    Reaction is unknown   Onglyza [Saxagliptin] Other (See Comments)    Reaction is unknown   Other     No otc pain medications   Rofecoxib Other (See Comments)    unknown   Simvastatin    Tequin [Gatifloxacin] Other (See Comments)    Reaction is unknown   Welchol [Colesevelam Hcl]    Amoxicillin Nausea And Vomiting and Rash    Has patient had a PCN reaction causing immediate rash, facial/tongue/throat swelling, SOB or lightheadedness with hypotension: Yes Has patient had a PCN reaction causing severe rash involving mucus membranes or skin necrosis: no  Has patient had a PCN reaction that required hospitalization No Has patient had a PCN reaction occurring within the last 10 years: No If all of the above answers are "NO", then may proceed with Cephalosporin use.     Medications Reviewed Today      Reviewed by Ilean China, RN (Registered Nurse) on 11/13/20 at 1311  Med List Status: <None>   Medication Order Taking? Sig Documenting Provider Last Dose Status Informant  ACCU-CHEK AVIVA PLUS test strip [id number] No CHECK BLOOD SUGAR UP TO 4 TIMES A DAY Ronnie Doss M, DO Taking Active   Accu-Chek Softclix Lancets lancets [id number] No CHECK BLOOD SUGAR 2 TIMES A DAY Reardon, Juanetta Beets, NP Taking Active   amLODipine (NORVASC) 5 MG tablet [id number]  Take 1 tablet (5 mg total) by mouth daily. Ronnie Doss M, DO  Active   atorvastatin (LIPITOR) 10 MG tablet [id number]  Take 1 tablet (10 mg total) by mouth daily. Ronnie Doss M, DO  Active   blood glucose meter kit and supplies [id number] No Dispense  based on patient and insurance preference. Use up to four times daily as directed. (FOR ICD-10 E10.9, E11.9). Ronnie Doss M, DO Taking Active   carvedilol (COREG) 12.5 MG tablet [id number]  Take 1 tablet (12.5 mg total) by mouth 2 (two) times daily with a meal. Ronnie Doss M, DO  Active   Cholecalciferol 25 MCG (1000 UT) capsule [id number]  Take 1 capsule (1,000 Units total) by mouth daily. Ronnie Doss M, DO  Active   colchicine 0.6 MG tablet [id number] No Take 0.6 mg by mouth daily as needed. [provider] Taking Active   Febuxostat 80 MG TABS [id number] No Take by mouth. [provider] Taking Active   fluticasone (FLONASE) 50 MCG/ACT nasal spray [id number]  Place 2 sprays into both nostrils daily. Use 2 sprays in each nostril daily Ronnie Doss M, DO  Active   furosemide (LASIX) 40 MG tablet [id number]  TAKE 1 TABLET TWICE WEEKLY Ronnie Doss M, DO  Active   Insulin Glargine (BASAGLAR KWIKPEN) 100 UNIT/ML [id number] No Inject 8 Units into the skin daily. Brita Romp, NP Taking Active   ketorolac (ACULAR) 0.5 % ophthalmic solution [id number] No Place 1 drop into the right eye every morning. Rankin, Clent Demark, MD Taking Active   Lancets Misc. [place]  [id number] No Use as directed to check blood sugar up to 4x daily. E11.65 [name], DO Taking Active   levothyroxine (SYNTHROID) 50 MCG tablet [id number]  Take 1 tablet (50 mcg total) by mouth daily before breakfast. Cassandria Anger, MD  Active   pantoprazole (PROTONIX) 20 MG tablet [id number]  Take 1 tablet (20 mg total) by mouth daily. Ronnie Doss M, DO  Active   prednisoLONE acetate (PRED FORTE) 1 % ophthalmic suspension [id number] No Place 1 drop into the right eye 4 (four) times daily. 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 10/02/2019   Retinal hemorrhage of left eye 10/02/2019   Retinal microaneurysm of both eyes 10/02/2019   Dislocated intraocular lens, initial encounter 10/02/2019   Vitamin D deficiency 11/15/2017   Foot ulcer ([place]) 06/02/2016   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 05/18/2016   Cellulitis 05/05/2016   Diabetic foot infection ([place]) 05/05/2016   Nausea and vomiting 01/23/2015   CKD (chronic kidney disease), stage III (HCC)    Anemia in chronic renal disease    Gastroesophageal reflux disease without esophagitis 10/11/2014   Hypothyroidism 05/18/2014   Hip fracture requiring operative repair [facility])    Fall    Osteoporosis 09/06/2013   Type 2 diabetes mellitus with stage 3b chronic kidney disease, with long-term current use of insulin ([facility]) 07/20/2013   Edema 03/03/2011   Mixed hyperlipidemia    Diabetic retinopathy  ([place]) 10/28/2007   Hyperlipidemia associated with type 2 diabetes mellitus ([place]) 10/28/2007   Anxiety state 10/28/2007   Depression 10/28/2007   Essential hypertension 10/28/2007   Disorder resulting from impaired renal function 10/28/2007   COLONIC POLYPS,  HYPERPLASTIC 11/17/2006   DIVERTICULOSIS, COLON 11/17/2006    Immunization History  Administered Date(s) Administered   Fluad Quad(high Dose 65+) 02/05/2019, 03/05/2020   Influenza, High Dose Seasonal PF 03/05/2016, 03/14/2017, 03/01/2018   Influenza,inj,Quad PF,6+ Mos 02/16/2013, 02/27/2015   Moderna Sars-Covid-2 Vaccination 10/14/2020   PFIZER(Purple Top)SARS-COV-2 Vaccination 06/13/2019, 07/04/2019, 03/05/2020   Pneumococcal Conjugate-13 10/11/2014   Pneumococcal Polysaccharide-23 10/10/2012   Tdap 10/06/2012    Conditions to be addressed/monitored: HTN, HLD, DMII, and CKD Stage 3  Care Plan : PHARMD MEDICATION MANAGEMENT  Updates made by Lavera Guise, [place] since 11/21/2020 12:00 AM     Problem: DISEASE PROGRESSION PREVENTION      Long-Range Goal: T2DM   This Visit's Progress: On track  Priority: High  Note:   Current Barriers:  Unable to independently monitor therapeutic efficacy Unable to maintain control of BLOOD SUGAR  Pharmacist Clinical Goal(s):  Over the next 90 days, patient will maintain control of blood sugar as evidenced by no hypoglycemia, minimize hyperglycemia  through collaboration with PharmD and provider.   Interventions: 1:1 collaboration with [name], DO regarding development and update of comprehensive plan of care as evidenced by provider attestation and co-signature Inter-disciplinary care team collaboration (see longitudinal plan of care) Comprehensive medication review performed; medication list updated in electronic medical record  Diabetes: Uncontrolled; current treatment:BASAGLAR 8 UNITS;  Libre 2 CGM system supplied by [employer] via parachute portal Discussed  how to troubleshoot and use the LIBRE 2 CGM system--walked patient through set up today, sensor to read in 60 minutes, if sensor falls off, do not replace Encouraged patient to bring CGM to all of her appointments, especially endocrine NO hypoglycemic symptoms OR HYPOGLYCEMIA REPORTED PER LIBRE Patient is eating a light snack before bedtime and denies any hypoglycemia since we last spoke Current exercise: n/a Most recent eGFR: 32 Cardiovascular risk reduction: Current hypertensive regimen:amlodipine, carvedilol, not on ACE/ARB Current hyperlipidemia regimen: atorvastatin Current antiplatelet regimen: n/a  Patient Goals/Self-Care Activities Over the next 90 days, patient will:  - take medications as prescribed check glucose continuously using libre 2, document, and provide at future appointments  Follow Up Plan: Telephone follow up appointment with care management team member scheduled for: 3 WEEKS        Medication Assistance: None required.  Patient affirms current coverage meets needs.  Patient's preferred pharmacy is:  [facility] - [place], [place] [address] Phone: [phone] Fax: [phone]  [id number] [place] [id number]: [phone]: [phone] [place] [address]Phone: [phone]: [address]Phone: [phone]: [phone] Uses pill box? No - HUSBAND ASSISTS Pt endorses 100% compliance  Follow Up:  Patient agrees to Care Plan and Follow-up.  Plan: Telephone follow up appointment with care management team member scheduled for:  3 WEEKS  JRegina Eck PharmD,  BCPS Clinical Pharmacist, [place] Phone [phone]

## 2020-11-21 NOTE — Patient Instructions (Signed)
Visit Information  PATIENT GOALS:  Goals Addressed               This Visit's Progress     Patient Stated     T2DM PHARMD (pt-stated)        Current Barriers:  Unable to independently monitor therapeutic efficacy Unable to maintain control of BLOOD SUGAR  Pharmacist Clinical Goal(s):  Over the next 90 days, patient will maintain control of blood sugar as evidenced by no hypoglycemia, minimize hyperglycemia  through collaboration with PharmD and provider.   Interventions: 1:1 collaboration with [name], DO regarding development and update of comprehensive plan of care as evidenced by provider attestation and co-signature Inter-disciplinary care team collaboration (see longitudinal plan of care) Comprehensive medication review performed; medication list updated in electronic medical record  Diabetes: Uncontrolled; current treatment:BASAGLAR 8 UNITS;  Libre 2 CGM system supplied by [employer] via parachute portal Discussed how to troubleshoot and use the LIBRE 2 CGM system--walked patient through set up today, sensor to read in 60 minutes, if sensor falls off, do not replace Encouraged patient to bring CGM to all of her appointments, especially endocrine NO hypoglycemic symptoms OR HYPOGLYCEMIA REPORTED PER LIBRE Patient is eating a light snack before bedtime and denies any hypoglycemia since we last spoke Current exercise: n/a Most recent eGFR: 32 Cardiovascular risk reduction: Current hypertensive regimen:amlodipine, carvedilol, not on ACE/ARB Current hyperlipidemia regimen: atorvastatin Current antiplatelet regimen: n/a  Patient Goals/Self-Care Activities Over the next 90 days, patient will:  - take medications as prescribed check glucose continuously using libre 2, document, and provide at future appointments  Follow Up Plan: Telephone follow up appointment with care management team member scheduled for: 3 WEEKS            The patient  verbalized understanding of instructions, educational materials, and care plan provided today and declined offer to receive copy of patient instructions, educational materials, and care plan.   Telephone follow up appointment with care management team member scheduled for: 3 WEEKS  Signature Regina Eck, PharmD, BCPS Clinical Pharmacist, [place]  II Phone [phone]

## 2020-11-25 ENCOUNTER — Other Ambulatory Visit: Payer: Self-pay | Admitting: Family Medicine

## 2020-11-25 DIAGNOSIS — E034 Atrophy of thyroid (acquired): Secondary | ICD-10-CM

## 2020-12-03 ENCOUNTER — Telehealth: Payer: Self-pay | Admitting: Family Medicine

## 2020-12-03 DIAGNOSIS — E034 Atrophy of thyroid (acquired): Secondary | ICD-10-CM

## 2020-12-03 MED ORDER — LEVOTHYROXINE SODIUM 50 MCG PO TABS: 50.0000 ug | ORAL_TABLET | Freq: Every day | ORAL | 1 refills | Status: DC

## 2020-12-03 NOTE — Telephone Encounter (Signed)
I reviewed the notes and we had renewed her 75 mcg at our visit in early May but when her labs were checked she was noted to have too high of a thyroid level and Dr. Dorris Fetch reduced her dose to 50 mcg.  His note states that this was discussed with the patient some not sure what happened exactly but she should not be taking 75 mcg she should be only taking 50 mcg daily.  Please make sure that the 75 mcg is discontinued both in her MAR and by the mail order pharmacy

## 2020-12-12 ENCOUNTER — Ambulatory Visit: Payer: Medicare Other | Admitting: Licensed Clinical Social Worker

## 2020-12-12 DIAGNOSIS — I152 Hypertension secondary to endocrine disorders: Secondary | ICD-10-CM

## 2020-12-12 DIAGNOSIS — N183 Chronic kidney disease, stage 3 unspecified: Secondary | ICD-10-CM

## 2020-12-12 DIAGNOSIS — D631 Anemia in chronic kidney disease: Secondary | ICD-10-CM

## 2020-12-12 DIAGNOSIS — N189 Chronic kidney disease, unspecified: Secondary | ICD-10-CM | POA: Diagnosis not present

## 2020-12-12 DIAGNOSIS — E785 Hyperlipidemia, unspecified: Secondary | ICD-10-CM | POA: Diagnosis not present

## 2020-12-12 DIAGNOSIS — Z794 Long term (current) use of insulin: Secondary | ICD-10-CM | POA: Diagnosis not present

## 2020-12-12 DIAGNOSIS — H353134 Nonexudative age-related macular degeneration, bilateral, advanced atrophic with subfoveal involvement: Secondary | ICD-10-CM

## 2020-12-12 DIAGNOSIS — E1159 Type 2 diabetes mellitus with other circulatory complications: Secondary | ICD-10-CM

## 2020-12-12 DIAGNOSIS — E1122 Type 2 diabetes mellitus with diabetic chronic kidney disease: Secondary | ICD-10-CM | POA: Diagnosis not present

## 2020-12-12 DIAGNOSIS — M810 Age-related osteoporosis without current pathological fracture: Secondary | ICD-10-CM

## 2020-12-12 DIAGNOSIS — E034 Atrophy of thyroid (acquired): Secondary | ICD-10-CM | POA: Diagnosis not present

## 2020-12-12 DIAGNOSIS — F32A Depression, unspecified: Secondary | ICD-10-CM

## 2020-12-12 DIAGNOSIS — E1169 Type 2 diabetes mellitus with other specified complication: Secondary | ICD-10-CM | POA: Diagnosis not present

## 2020-12-12 NOTE — Chronic Care Management (AMB) (Signed)
Chronic Care Management    Clinical Social Work Note  12/12/2020 Name: [name] MRN: [id number] DOB: 1945-09-13  [name] is a 75 y.o. year old female who is a primary care patient of [name], DO. The CCM team was consulted to assist the patient with chronic disease management and/or care coordination needs related to: [name] .   Engaged with patient / [name], spouse of patient, by telephone for follow up visit in response to provider referral for social work chronic care management and care coordination services.   Consent to Services:  The patient was given information about Chronic Care Management services, agreed to services, and gave verbal consent prior to initiation of services.  Please see initial visit note for detailed documentation.   Patient agreed to services and consent obtained.   Assessment: Review of patient past medical history, allergies, medications, and health status, including review of relevant consultants reports was performed today as part of a comprehensive evaluation and provision of chronic care management and care coordination services.     SDOH (Social Determinants of Health) assessments and interventions performed:  SDOH Interventions    Flowsheet Row Most Recent Value  SDOH Interventions   Depression Interventions/Treatment  --  [informed client of LCSW support and of RNCM support]        Advanced Directives Status: See Vynca application for related entries.  CCM Care Plan  Allergies  Allergen Reactions   Aspirin Nausea And Vomiting   Ciprofloxacin Other (See Comments)    Upset Stomach   Codeine Nausea And Vomiting    Makes me sick    Losartan     Hyperkalemia    Micardis [Telmisartan] Other (See Comments)    unknown   Nexium [Esomeprazole Magnesium] Other (See Comments)    Causes internal bleeding   Niaspan [Niacin Er] Other (See Comments)    Reaction is unknown   Onglyza [Saxagliptin] Other  (See Comments)    Reaction is unknown   Other     No otc pain medications   Rofecoxib Other (See Comments)    unknown   Simvastatin    Tequin [Gatifloxacin] Other (See Comments)    Reaction is unknown   Welchol [Colesevelam Hcl]    Amoxicillin Nausea And Vomiting and Rash    Has patient had a PCN reaction causing immediate rash, facial/tongue/throat swelling, SOB or lightheadedness with hypotension: Yes Has patient had a PCN reaction causing severe rash involving mucus membranes or skin necrosis: no  Has patient had a PCN reaction that required hospitalization No Has patient had a PCN reaction occurring within the last 10 years: No If all of the above answers are "NO", then may proceed with Cephalosporin use.     Outpatient Encounter Medications as of 12/12/2020  Medication Sig   ACCU-CHEK AVIVA PLUS test strip CHECK BLOOD SUGAR UP TO 4 TIMES A DAY   Accu-Chek Softclix Lancets lancets CHECK BLOOD SUGAR 2 TIMES A DAY   amLODipine (NORVASC) 5 MG tablet Take 1 tablet (5 mg total) by mouth daily.   atorvastatin (LIPITOR) 10 MG tablet Take 1 tablet (10 mg total) by mouth daily.   blood glucose meter kit and supplies Dispense based on patient and insurance preference. Use up to four times daily as directed. (FOR ICD-10 E10.9, E11.9).   carvedilol (COREG) 12.5 MG tablet Take 1 tablet (12.5 mg total) by mouth 2 (two) times daily with a meal.   Cholecalciferol 25 MCG (1000 UT) capsule Take 1  capsule (1,000 Units total) by mouth daily.   colchicine 0.6 MG tablet Take 0.6 mg by mouth daily as needed.   Febuxostat 80 MG TABS Take by mouth.   fluticasone (FLONASE) 50 MCG/ACT nasal spray Place 2 sprays into both nostrils daily. Use 2 sprays in each nostril daily   furosemide (LASIX) 40 MG tablet TAKE 1 TABLET TWICE WEEKLY   Insulin Glargine (BASAGLAR KWIKPEN) 100 UNIT/ML Inject 8 Units into the skin daily.   ketorolac (ACULAR) 0.5 % ophthalmic solution Place 1 drop into the right eye every  morning.   Lancets Misc. MISC Use as directed to check blood sugar up to 4x daily. E11.65   levothyroxine (SYNTHROID) 50 MCG tablet Take 1 tablet (50 mcg total) by mouth daily before breakfast.   pantoprazole (PROTONIX) 20 MG tablet Take 1 tablet (20 mg total) by mouth daily.   prednisoLONE acetate (PRED FORTE) 1 % ophthalmic suspension Place 1 drop into the right eye 4 (four) times daily.   ULTICARE MICRO PEN NEEDLES 32G X 4 MM MISC TWICE DAILY   No facility-administered encounter medications on file as of 12/12/2020.    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 10/02/2019   Retinal hemorrhage of left eye 10/02/2019   Retinal microaneurysm of both eyes 10/02/2019   Dislocated intraocular lens, initial encounter 10/02/2019   Vitamin D deficiency 11/15/2017   Foot ulcer ([place]) 06/02/2016   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 05/18/2016   Cellulitis 05/05/2016   Diabetic foot infection ([place]) 05/05/2016   Nausea and vomiting 01/23/2015   CKD (chronic kidney disease), stage III (HCC)    Anemia in chronic renal disease    Gastroesophageal reflux disease without esophagitis 10/11/2014   Hypothyroidism 05/18/2014   Hip fracture requiring operative repair [facility])    Fall    Osteoporosis 09/06/2013   Type 2 diabetes mellitus with stage 3b chronic kidney disease, with long-term current use of insulin ([place]) 07/20/2013   Edema 03/03/2011   Mixed hyperlipidemia    Diabetic retinopathy ([place]) 10/28/2007   Hyperlipidemia associated with type 2 diabetes mellitus ([place]) 10/28/2007   Anxiety state 10/28/2007   Depression 10/28/2007    Essential hypertension 10/28/2007   Disorder resulting from impaired renal function 10/28/2007   COLONIC POLYPS, HYPERPLASTIC 11/17/2006   DIVERTICULOSIS, COLON 11/17/2006    Conditions to be addressed/monitored: monitor client management of health needs; monitor client completion of ADLs, daily, as she is able   Care Plan : [place]  Updates made by Katha Cabal, LCSW since 12/12/2020 12:00 AM     Problem: Coping Skills (General Plan of Care)      Goal: Manage medical needs and complete ADLs daily   Start Date: 12/12/2020  Expected End Date: 03/10/2021  This Visit's Progress: On track  Recent Progress: On track  Priority: Medium  Note:   Current barriers:   Patient in need of assistance with connecting to community resources for assisting with vision needs and helping client complete ADLs Patient is unable to independently navigate community resource options without care coordination support Legally Blind Mobility issues  Clinical Goals:  LCSW to communicate with client in next 30 days  to discuss client management of medical needs and to discuss client completion of daily ADLs Client to attend scheduled medical appointments in next 30 days Client to communicate with RNCM or LCSW as needed in next 30 days  Clinical Interventions:  Collaboration with [name], DO regarding development and update of comprehensive plan of care as evidenced by provider attestation and co-signature Talked with [name], spouse of client , about client needs. He said client had COVID 19.  He said that he got COVID 19 first and then client got COVID 19.  He said that client had experienced COVID 19 for about two weeks Talked with [name] about current needs Client and LCSW spoke of her decreased energy level and of her not feeling well. Client said she had been resting a good deal of time Talked with client about her CGM and machine that reads out sugar level, as needed to  client. Client said she had CGM for about 2 months now and really enjoys using CGM. LCSW had talked previously with client about support from [employer] [facility] [employer] (client had said she received a watch, two magnifiers, flashlight, ink pens, markers for microwave, writing tablet). Client has talked with [name], [facility] [employer] representative.  With these items provided through [employer] [facility] and with CGM, client is now feeling more independent in managing some of her health needs Talked with client about sleeping issues of client Talked with client about her management of diabetes Talked with [name] about previous challenges in trying to monitor her blood sugar levels, due to her vision issues  Patient Activities  Listens to music Talks with family and friends via phone Communicates regularly with her spouse, [name]  Patient Coping Strengths: Takes medications as prescribed Attends scheduled medical appointments Has transport support from spouse, [name]  Patient Deficits:   Legally blind Mobility issues  Patient Goals:  Client to communicate with RNCM or LCSW as needed in next 30 days for CCM support Client to manage health needs faced and complete ADLs daily for next 30 days Communicate daily with spouse to discuss client needs -  Follow Up Plan:  LCSW  to call client on 01/21/21     [name].Gwendalynn Eckstrom MSW, LCSW Licensed Clinical Social Worker [facility] Care Management [phone]

## 2020-12-12 NOTE — Patient Instructions (Signed)
Visit Information  PATIENT GOALS:  Goals Addressed               This Visit's Progress     Manage health needs faced; complete ADLs daily (pt-stated)        Timeframe:  Short-Term Goal Priority:  Medium Progress: On Track Start Date:      12/12/20                     Expected End Date:                      03/10/21  Follow Up Date  01/21/21   Protect My Health (Patient)   Manage Health needs and complete ADLs daily    Why is this important?   Screening tests can find diseases early when they are easier to treat.  Your doctor or nurse will talk with you about which tests are important for you.  Getting shots for common diseases like the flu and shingles will help prevent them.    Patient Activities  Listens to music Talks with family and friends via phone Communicates regularly with her spouse, [name]  Patient Coping Strengths: Takes medications as prescribed Attends scheduled medical appointments Has transport support from spouse, [name]  Patient Deficits:   Legally blind Mobility issues  Patient Goals:  Client to communicate with RNCM or LCSW as needed in next 30 days for CCM support Client to manage health needs faced and complete ADLs daily for next 30 days Communicate daily with spouse to discuss client needs -  Follow Up Plan:  LCSW  to call client on 01/21/21   [name].Damyra Luscher MSW, LCSW Licensed Clinical Social Worker [facility] Care Management [phone]

## 2020-12-18 ENCOUNTER — Telehealth: Payer: Medicare Other | Admitting: *Deleted

## 2020-12-24 ENCOUNTER — Telehealth: Payer: Medicare Other

## 2021-01-06 DIAGNOSIS — L84 Corns and callosities: Secondary | ICD-10-CM | POA: Diagnosis not present

## 2021-01-06 DIAGNOSIS — B351 Tinea unguium: Secondary | ICD-10-CM | POA: Diagnosis not present

## 2021-01-06 DIAGNOSIS — M79676 Pain in unspecified toe(s): Secondary | ICD-10-CM | POA: Diagnosis not present

## 2021-01-06 DIAGNOSIS — E1151 Type 2 diabetes mellitus with diabetic peripheral angiopathy without gangrene: Secondary | ICD-10-CM | POA: Diagnosis not present

## 2021-01-08 ENCOUNTER — Telehealth: Payer: Medicare Other

## 2021-01-19 ENCOUNTER — Encounter (INDEPENDENT_AMBULATORY_CARE_PROVIDER_SITE_OTHER): Payer: Medicare Other | Admitting: Ophthalmology

## 2021-01-21 ENCOUNTER — Ambulatory Visit (INDEPENDENT_AMBULATORY_CARE_PROVIDER_SITE_OTHER): Payer: Medicare Other | Admitting: Licensed Clinical Social Worker

## 2021-01-21 DIAGNOSIS — F32A Depression, unspecified: Secondary | ICD-10-CM | POA: Diagnosis not present

## 2021-01-21 DIAGNOSIS — E1169 Type 2 diabetes mellitus with other specified complication: Secondary | ICD-10-CM | POA: Diagnosis not present

## 2021-01-21 DIAGNOSIS — E1122 Type 2 diabetes mellitus with diabetic chronic kidney disease: Secondary | ICD-10-CM

## 2021-01-21 DIAGNOSIS — E1159 Type 2 diabetes mellitus with other circulatory complications: Secondary | ICD-10-CM | POA: Diagnosis not present

## 2021-01-21 DIAGNOSIS — H353134 Nonexudative age-related macular degeneration, bilateral, advanced atrophic with subfoveal involvement: Secondary | ICD-10-CM

## 2021-01-21 DIAGNOSIS — D631 Anemia in chronic kidney disease: Secondary | ICD-10-CM

## 2021-01-21 DIAGNOSIS — E785 Hyperlipidemia, unspecified: Secondary | ICD-10-CM | POA: Diagnosis not present

## 2021-01-21 DIAGNOSIS — E034 Atrophy of thyroid (acquired): Secondary | ICD-10-CM

## 2021-01-21 DIAGNOSIS — M810 Age-related osteoporosis without current pathological fracture: Secondary | ICD-10-CM | POA: Diagnosis not present

## 2021-01-21 DIAGNOSIS — N183 Chronic kidney disease, stage 3 unspecified: Secondary | ICD-10-CM | POA: Diagnosis not present

## 2021-01-21 DIAGNOSIS — Z794 Long term (current) use of insulin: Secondary | ICD-10-CM

## 2021-01-21 DIAGNOSIS — I152 Hypertension secondary to endocrine disorders: Secondary | ICD-10-CM | POA: Diagnosis not present

## 2021-01-21 DIAGNOSIS — N189 Chronic kidney disease, unspecified: Secondary | ICD-10-CM | POA: Diagnosis not present

## 2021-01-21 DIAGNOSIS — F411 Generalized anxiety disorder: Secondary | ICD-10-CM

## 2021-01-21 NOTE — Chronic Care Management (AMB) (Signed)
Chronic Care Management    Clinical Social Work Note  01/21/2021 Name: [name] MRN: [id number] DOB: Dec 11, 1945  [name] is a 75 y.o. year old female who is a primary care patient of [name], DO. The CCM team was consulted to assist the patient with chronic disease management and/or care coordination needs related to: [name] .   Engaged with patient by telephone for follow up visit in response to provider referral for social work chronic care management and care coordination services.   Consent to Services:  The patient was given information about Chronic Care Management services, agreed to services, and gave verbal consent prior to initiation of services.  Please see initial visit note for detailed documentation.   Patient agreed to services and consent obtained.   Assessment: Review of patient past medical history, allergies, medications, and health status, including review of relevant consultants reports was performed today as part of a comprehensive evaluation and provision of chronic care management and care coordination services.     SDOH (Social Determinants of Health) assessments and interventions performed:  SDOH Interventions    Flowsheet Row Most Recent Value  SDOH Interventions   Physical Activity Interventions Other (Comments)  [patient is legally blind, has ambulation difficulty, risk for falls]  Depression Interventions/Treatment  --  [informed client of LCSW support and of RNCM support]        Advanced Directives Status: See Vynca application for related entries.  CCM Care Plan  Allergies  Allergen Reactions   Aspirin Nausea And Vomiting   Ciprofloxacin Other (See Comments)    Upset Stomach   Codeine Nausea And Vomiting    Makes me sick    Losartan     Hyperkalemia    Micardis [Telmisartan] Other (See Comments)    unknown   Nexium [Esomeprazole Magnesium] Other (See Comments)    Causes internal bleeding   Niaspan  [Niacin Er] Other (See Comments)    Reaction is unknown   Onglyza [Saxagliptin] Other (See Comments)    Reaction is unknown   Other     No otc pain medications   Rofecoxib Other (See Comments)    unknown   Simvastatin    Tequin [Gatifloxacin] Other (See Comments)    Reaction is unknown   Welchol [Colesevelam Hcl]    Amoxicillin Nausea And Vomiting and Rash    Has patient had a PCN reaction causing immediate rash, facial/tongue/throat swelling, SOB or lightheadedness with hypotension: Yes Has patient had a PCN reaction causing severe rash involving mucus membranes or skin necrosis: no  Has patient had a PCN reaction that required hospitalization No Has patient had a PCN reaction occurring within the last 10 years: No If all of the above answers are "NO", then may proceed with Cephalosporin use.     Outpatient Encounter Medications as of 01/21/2021  Medication Sig   ACCU-CHEK AVIVA PLUS test strip CHECK BLOOD SUGAR UP TO 4 TIMES A DAY   Accu-Chek Softclix Lancets lancets CHECK BLOOD SUGAR 2 TIMES A DAY   amLODipine (NORVASC) 5 MG tablet Take 1 tablet (5 mg total) by mouth daily.   atorvastatin (LIPITOR) 10 MG tablet Take 1 tablet (10 mg total) by mouth daily.   blood glucose meter kit and supplies Dispense based on patient and insurance preference. Use up to four times daily as directed. (FOR ICD-10 E10.9, E11.9).   carvedilol (COREG) 12.5 MG tablet Take 1 tablet (12.5 mg total) by mouth 2 (two) times daily with a  meal.   Cholecalciferol 25 MCG (1000 UT) capsule Take 1 capsule (1,000 Units total) by mouth daily.   colchicine 0.6 MG tablet Take 0.6 mg by mouth daily as needed.   Febuxostat 80 MG TABS Take by mouth.   fluticasone (FLONASE) 50 MCG/ACT nasal spray Place 2 sprays into both nostrils daily. Use 2 sprays in each nostril daily   furosemide (LASIX) 40 MG tablet TAKE 1 TABLET TWICE WEEKLY   Insulin Glargine (BASAGLAR KWIKPEN) 100 UNIT/ML Inject 8 Units into the skin daily.    ketorolac (ACULAR) 0.5 % ophthalmic solution Place 1 drop into the right eye every morning.   Lancets Misc. MISC Use as directed to check blood sugar up to 4x daily. E11.65   levothyroxine (SYNTHROID) 50 MCG tablet Take 1 tablet (50 mcg total) by mouth daily before breakfast.   pantoprazole (PROTONIX) 20 MG tablet Take 1 tablet (20 mg total) by mouth daily.   prednisoLONE acetate (PRED FORTE) 1 % ophthalmic suspension Place 1 drop into the right eye 4 (four) times daily.   ULTICARE MICRO PEN NEEDLES 32G X 4 MM MISC TWICE DAILY   No facility-administered encounter medications on file as of 01/21/2021.    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 10/02/2019   Retinal hemorrhage of left eye 10/02/2019   Retinal microaneurysm of both eyes 10/02/2019   Dislocated intraocular lens, initial encounter 10/02/2019   Vitamin D deficiency 11/15/2017   Foot ulcer ([place]) 06/02/2016   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 05/18/2016   Cellulitis 05/05/2016   Diabetic foot infection ([place]) 05/05/2016   Nausea and vomiting 01/23/2015   CKD (chronic kidney disease), stage III (HCC)    Anemia in chronic renal disease    Gastroesophageal reflux disease without esophagitis 10/11/2014   Hypothyroidism 05/18/2014   Hip fracture requiring operative repair [facility])    Fall    Osteoporosis 09/06/2013   Type 2 diabetes mellitus with stage 3b chronic kidney disease, with long-term current use of insulin ([place]) 07/20/2013   Edema 03/03/2011   Mixed hyperlipidemia    Diabetic retinopathy ([place]) 10/28/2007   Hyperlipidemia associated with type 2  diabetes mellitus ([place]) 10/28/2007   Anxiety state 10/28/2007   Depression 10/28/2007   Essential hypertension 10/28/2007   Disorder resulting from impaired renal function 10/28/2007   COLONIC POLYPS, HYPERPLASTIC 11/17/2006   DIVERTICULOSIS, COLON 11/17/2006    Conditions to be addressed/monitored: monitor client management of vision needs. Monitor client completion of ADLs, as she is able  Care Plan : LCSW Care Plan  Updates made by Katha Cabal, LCSW since 01/21/2021 12:00 AM     Problem: Coping Skills (General Plan of Care)      Goal: Manage medical needs and complete ADLs daily   Start Date: 01/21/2021  Expected End Date: 04/21/2021  This Visit's Progress: On track  Recent Progress: On track  Priority: Medium  Note:   Current barriers:   Patient in need of assistance with connecting to community resources for assisting with vision needs and helping client complete ADLs Patient is unable to independently navigate community resource options without care coordination support Legally Blind Mobility issues  Clinical Goals:  LCSW to communicate with client in next 30 days to discuss client management of medical needs and to discuss client completion of daily ADLs Client to attend scheduled medical appointments in next 30 days Client to communicate with RNCM or LCSW as needed in next 30 days  Clinical Interventions:  Collaboration with [name], DO regarding development and update of comprehensive plan of care as evidenced by provider attestation and co-signature Talked with [name] about current needs of client Talked with client about her CGM and machine that reads out sugar level, as needed to client. Client said she had CGM for over 2 months now and really enjoys using CGM. LCSW talked with client about support from [employer] (client had said she received a watch, two magnifiers, flashlight, ink pens, markers for microwave, writing tablet). Client  has talked with Glenard Haring, [employer] representative.  With these items provided through [employer] and with CGM, client is now feeling more independent in managing some of her health needs Talked with client about sleeping issues of client Talked with client about her management of diabetes Talked with Devona about previous challenges in trying to monitor her blood sugar levels, due to her vision issues Talked with [place] about transport needs. She said her spouse, [name], drives her to and from her needed medical appointments Talked with Janece about her socialization with others. She said she has several friends who call her regularly to check on her needs. She enjoys talking with these friends via phone.  Reminded Nekesha that she can call RNCM as needed for nursing support through CCM program  Patient Activities  Listens to music Talks with family and friends via phone Communicates regularly with her spouse, [name]  Patient Coping Strengths: Takes medications as prescribed Attends scheduled medical appointments Has transport support from spouse, [name]  Patient Deficits:   Legally blind Mobility issues  Patient Goals:  Client to communicate with RNCM or LCSW as needed in next 30 days for CCM support Client to manage health needs faced and complete ADLs daily for next 30 days Communicate daily with spouse to discuss client needs -  Follow Up Plan:  LCSW  to call client or [name], spouse of client, on 03/05/21 to assess client needs    [name].Jahking Lesser MSW, LCSW Licensed Clinical Social Worker [facility] Care Management [phone]

## 2021-01-21 NOTE — Patient Instructions (Signed)
Visit Information  PATIENT GOALS:  Goals Addressed               This Visit's Progress     Manage health needs faced; complete ADLs daily (pt-stated)        Timeframe:  Short-Term Goal Priority:  Medium Progress: On Track Start Date:      01/21/21                     Expected End Date:                      04/21/21  Follow Up Date  03/05/21   Protect My Health (Patient)   Manage Health needs and complete ADLs daily    Why is this important?   Screening tests can find diseases early when they are easier to treat.  Your doctor or nurse will talk with you about which tests are important for you.  Getting shots for common diseases like the flu and shingles will help prevent them.    Patient Activities  Listens to music Talks with family and friends via phone Communicates regularly with her spouse, [name]  Patient Coping Strengths: Takes medications as prescribed Attends scheduled medical appointments Has transport support from spouse, [name]  Patient Deficits:   Legally blind Mobility issues  Patient Goals:  Client to communicate with RNCM or LCSW as needed in next 30 days for CCM support Client to manage health needs faced and complete ADLs daily for next 30 days Communicate daily with spouse to discuss client needs -  Follow Up Plan:  LCSW  to call client or [name], spouse of client,  on 03/05/21 to assess client needs   [name].Melea Prezioso MSW, LCSW Licensed Clinical Social Worker [facility] Management [phone]

## 2021-01-28 ENCOUNTER — Encounter (INDEPENDENT_AMBULATORY_CARE_PROVIDER_SITE_OTHER): Payer: Medicare Other | Admitting: Ophthalmology

## 2021-01-28 ENCOUNTER — Other Ambulatory Visit: Payer: Self-pay

## 2021-01-28 ENCOUNTER — Ambulatory Visit (INDEPENDENT_AMBULATORY_CARE_PROVIDER_SITE_OTHER): Payer: Medicare Other | Admitting: Ophthalmology

## 2021-01-28 ENCOUNTER — Encounter (INDEPENDENT_AMBULATORY_CARE_PROVIDER_SITE_OTHER): Payer: Self-pay | Admitting: Ophthalmology

## 2021-01-28 DIAGNOSIS — Z794 Long term (current) use of insulin: Secondary | ICD-10-CM

## 2021-01-28 DIAGNOSIS — H35351 Cystoid macular degeneration, right eye: Secondary | ICD-10-CM

## 2021-01-28 DIAGNOSIS — E113553 Type 2 diabetes mellitus with stable proliferative diabetic retinopathy, bilateral: Secondary | ICD-10-CM

## 2021-01-28 NOTE — Progress Notes (Signed)
01/28/2021     CHIEF COMPLAINT Patient presents for No chief complaint on file.     HISTORY OF PRESENT ILLNESS: [name] is a 75 y.o. female who presents to the clinic today for:   HPI   9 mos fu ou oct. Patient states vision is stable and unchanged since last visit. Denies any new floaters or FOL.  BS: 110 this morning A1C: 7.2 Ciprofloxacin allergy. Pt states "I use prescription drops but I don't know what they are."  Last edited by Laurin Coder on 01/28/2021  9:52 AM.      Referring physician: [name], DO [place],  [place] [address]  HISTORICAL INFORMATION:   Selected notes from the MEDICAL RECORD NUMBER    Lab Results  Component Value Date   HGBA1C 7.4 (H) 10/01/2020     CURRENT MEDICATIONS: Current Outpatient Medications (Ophthalmic Drugs)  Medication Sig   ketorolac (ACULAR) 0.5 % ophthalmic solution Place 1 drop into the right eye every morning.   prednisoLONE acetate (PRED FORTE) 1 % ophthalmic suspension Place 1 drop into the right eye 4 (four) times daily.   No current facility-administered medications for this visit. (Ophthalmic Drugs)   Current Outpatient Medications (Other)  Medication Sig   ACCU-CHEK AVIVA PLUS test strip CHECK BLOOD SUGAR UP TO 4 TIMES A DAY   Accu-Chek Softclix Lancets lancets CHECK BLOOD SUGAR 2 TIMES A DAY   amLODipine (NORVASC) 5 MG tablet Take 1 tablet (5 mg total) by mouth daily.   atorvastatin (LIPITOR) 10 MG tablet Take 1 tablet (10 mg total) by mouth daily.   blood glucose meter kit and supplies Dispense based on patient and insurance preference. Use up to four times daily as directed. (FOR ICD-10 E10.9, E11.9).   carvedilol (COREG) 12.5 MG tablet Take 1 tablet (12.5 mg total) by mouth 2 (two) times daily with a meal.   Cholecalciferol 25 MCG (1000 UT) capsule Take 1 capsule (1,000 Units total) by mouth daily.   colchicine 0.6 MG tablet Take 0.6 mg by mouth daily as needed.   Febuxostat 80  MG TABS Take by mouth.   fluticasone (FLONASE) 50 MCG/ACT nasal spray Place 2 sprays into both nostrils daily. Use 2 sprays in each nostril daily   furosemide (LASIX) 40 MG tablet TAKE 1 TABLET TWICE WEEKLY   Insulin Glargine (BASAGLAR KWIKPEN) 100 UNIT/ML Inject 8 Units into the skin daily.   Lancets Misc. MISC Use as directed to check blood sugar up to 4x daily. E11.65   levothyroxine (SYNTHROID) 50 MCG tablet Take 1 tablet (50 mcg total) by mouth daily before breakfast.   pantoprazole (PROTONIX) 20 MG tablet Take 1 tablet (20 mg total) by mouth daily.   ULTICARE MICRO PEN NEEDLES 32G X 4 MM MISC TWICE DAILY   No current facility-administered medications for this visit. (Other)      REVIEW OF SYSTEMS:    ALLERGIES Allergies  Allergen Reactions   Aspirin Nausea And Vomiting   Ciprofloxacin Other (See Comments)    Upset Stomach   Codeine Nausea And Vomiting    Makes me sick    Losartan     Hyperkalemia    Micardis [Telmisartan] Other (See Comments)    unknown   Nexium [Esomeprazole Magnesium] Other (See Comments)    Causes internal bleeding   Niaspan [Niacin Er] Other (See Comments)    Reaction is unknown   Onglyza [Saxagliptin] Other (See Comments)    Reaction is unknown  Other     No otc pain medications   Rofecoxib Other (See Comments)    unknown   Simvastatin    Tequin [Gatifloxacin] Other (See Comments)    Reaction is unknown   Welchol [Colesevelam Hcl]    Amoxicillin Nausea And Vomiting and Rash    Has patient had a PCN reaction causing immediate rash, facial/tongue/throat swelling, SOB or lightheadedness with hypotension: Yes Has patient had a PCN reaction causing severe rash involving mucus membranes or skin necrosis: no  Has patient had a PCN reaction that required hospitalization No Has patient had a PCN reaction occurring within the last 10 years: No If all of the above answers are "NO", then may proceed with Cephalosporin use.     PAST MEDICAL  HISTORY Past Medical History:  Diagnosis Date   Anal fistula    Anemia in chronic renal disease    Aranesp injection --  when Hg <11, last injection 12-24-14.   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    CKD (chronic kidney disease), stage III [facility])    nephrologist--  dr Mercy Moore-- [place]  [phone]   Complication of anesthesia    post-op confusion    Constipation    Coronary artery disease    Diabetic gastroparesis ([place])    Diabetic retinopathy ([place])    bilateral --  monitored by dr [name]   Diverticulosis of colon    GAD (generalized anxiety disorder)    GERD (gastroesophageal reflux disease)    History of colon polyps    benign   History of esophagitis    History of GI bleed    upper 2009  due to esophagitis  &  2002  due to Mallory-Weiss tear   History of hyperkalemia    pt had previously been canceled twice dos due to elevated K+, 07-29-2016 last date cancelled -- pt visited pcp same day treated w/ kayexelate and K+ came down, pt brought all her medication's in to pcp office and found pt was taking losartan that had been discontinued due to ckd, pcp stated this was cause of elevated K+   History of Mallory-Weiss syndrome    12/ 2002--  resolved   History of rectal abscess    12-29-2004  bedside I & D   Hyperlipidemia    Hypertension    Hypothyroidism    LAFB (left anterior fascicular block)    Right bundle branch block    Sacral decubitus ulcer    since 2014- 01-02-15 remains with wound" gauze dressing changes daily.   Type II diabetes mellitus ([place])    Past Surgical History:  Procedure Laterality Date   APPLICATION OF A-CELL OF EXTREMITY N/A 04/07/2015    Procedure: A CELL PLACMENT;  Surgeon: Loel Lofty Dillingham, DO;  Location: [place];  Service: Plastics;  Laterality: N/A;   CARDIOVASCULAR STRESS TEST  12-30-2004   normal perfusion study/  no ischemia or infartion/  normal LV wall function and wall motion , ef 66%   CATARACT EXTRACTION W/ INTRAOCULAR LENS  IMPLANT, BILATERAL  1995   COLONOSCOPY  2008   w/Dr.Brodie    COLONOSCOPY W/ POLYPECTOMY  last one 2008  COMPRESSION HIP SCREW Right 05/18/2014   Procedure: COMPRESSION HIP;  Surgeon: Carole Civil, MD;  Location: [facility];  Service: Orthopedics;  Laterality: Right;   ESOPHAGOGASTRODUODENOSCOPY  last one 01-09-2011   EVALUATION UNDER ANESTHESIA WITH FISTULECTOMY N/A 01/06/2015   Procedure: EXAM UNDER ANESTHESIA , placement of seton;  Surgeon: Jackolyn Confer, MD;  Location: [facility];  Service: General;  Laterality: N/A;   I & D EXTREMITY N/A 04/07/2015   Procedure: IRRIGATION AND DEBRIDEMENT ISCHIAL ULCER;  Surgeon: Loel Lofty Dillingham, DO;  Location: [facility];  Service: Plastics;  Laterality: N/A;   INCISION AND DRAINAGE OF WOUND N/A 09/30/2014   Procedure: IRRIGATION AND DEBRIDEMENT SACRAL WOUND, EXCISION OF PERIRECTAL TRACT WITH PLACEMENT OF ACCELL;  Surgeon: Theodoro Kos, DO;  Location: [facility];  Service: Plastics;  Laterality: N/A;   LIGATION OF INTERNAL FISTULA TRACT N/A 10/22/2016   Procedure: LIGATION OF INTERNAL FISTULA TRACT;  Surgeon: Leighton Ruff, MD;  Location: [facility];  Service: General;  Laterality: N/A;   ORIF FEMUR FRACTURE Left 10/09/2012   Procedure: OPEN REDUCTION INTERNAL FIXATION (ORIF) DISTAL FEMUR FRACTURE;  Surgeon: Rozanna Box, MD;  Location: [facility];  Service: Orthopedics;  Laterality: Left;   RETINOPATHY SURGERY Bilateral 1980's?   TRANSTHORACIC ECHOCARDIOGRAM  02-18-2011   mild LVH,  ef 55-60%,  grade I diastolic dysfunction/  mild TR/  RV systolic pressure increased consistant with moderate pulmonary hypertension    FAMILY  HISTORY Family History  Problem Relation Age of Onset   Colon cancer Father 86   Prostate cancer Father    Diabetes Father    Coronary artery disease Mother 69   Heart disease Mother    Diabetes Mother    Coronary artery disease Sister 22   Diabetes Sister    Heart disease Sister    Diabetes Sister    Diabetes Sister    Diabetes Sister    Diabetes Sister    Diabetes Sister    Diabetes Maternal Grandmother    Esophageal cancer Neg Hx    Stomach cancer Neg Hx    Rectal cancer Neg Hx     SOCIAL HISTORY Social History   Tobacco Use   Smoking status: Never   Smokeless tobacco: Never  Vaping Use   Vaping Use: Never used  Substance Use Topics   Alcohol use: No   Drug use: No         OPHTHALMIC EXAM:  Base Eye Exam     Visual Acuity (ETDRS)       Right Left   Dist [place] 20/40 -2 CF at 3'   Dist ph [place] NI 20/200         Tonometry (Tonopen, 9:54 AM)       Right Left   Pressure 15 10         Pupils       Dark Light Shape React APD   Right 3 3 Round Minimal None   Left 2 2 Round Minimal None         Visual Fields (Counting fingers)       Left Right   Restrictions Partial outer superior temporal, inferior temporal, superior nasal, inferior nasal deficiencies Partial outer superior temporal, inferior temporal, superior nasal, inferior nasal deficiencies         Extraocular Movement       Right Left    Full Full         Neuro/Psych     Oriented x3: Yes  Mood/Affect: Normal         Dilation     Both eyes: 1.0% Mydriacyl, 2.5% Phenylephrine @ 9:54 AM           Slit Lamp and Fundus Exam     External Exam       Right Left   External Normal Normal         Slit Lamp Exam       Right Left   Lids/Lashes Normal Normal   Conjunctiva/Sclera White and quiet, no exposed haptic bulbs. White and quiet, no exposed haptic bulbs.   Cornea Clear Clear   Anterior Chamber Deep and quiet Deep and quiet   Iris Round and reactive, PI  inferonasal patent Round and reactive   Lens Centered posterior chamber intraocular lens,, scleral tunnel haptic fixation, haptic tips well covered, no erosion , no iris chafe Centered posterior chamber intraocular lens   Anterior Vitreous Normal Normal         Fundus Exam       Right Left   Posterior Vitreous Clear, vitrectomized Clear, vitrectomized   Disc Normal Normal   C/D Ratio 0.5 0.5   Macula Geographic atrophy, good focal Geographic atrophy, good focal   Vessels PDR quiescent PDR quiescent   Periphery PRP 360.  attached, lesion creep over decades,  enlarging laser  scars PRP 360.  attached, lesion creep over decades,  enlarging laser  scars            IMAGING AND PROCEDURES  Imaging and Procedures for 01/28/21  OCT, Retina - OU - Both Eyes       Right Eye Quality was good. Scan locations included subfoveal. Central Foveal Thickness: 206. Progression has been stable. Findings include abnormal foveal contour.   Left Eye Quality was good. Scan locations included subfoveal. Central Foveal Thickness: 277. Progression has been stable. Findings include abnormal foveal contour.   Notes Minor perifoveal CME on the nasal aspect of the foveal avascular zone we will treat topically with NSAIDs. Currently on twice daily dosing OD, will continue chronic therapy once daily             ASSESSMENT/PLAN:  Controlled type 2 diabetes mellitus with stable proliferative retinopathy of both eyes, with long-term current use of insulin (HCC) Stable regressed disease, no active CME or CSME  Cystoid macular edema, right eye OD vastly improved acuity now on chronic suppressive NSAIDs using twice daily in an eye that required pseudophakic restoration via vitrectomy, IOL removal and insertion of Zeiss CT [place] lens via [place] scleral tunnel  NSAID likely prevents slight iris chafe leading to minor CME.  Improved acuity today     ICD-10-CM   1. Cystoid macular edema, right eye   H35.351 OCT, Retina - OU - Both Eyes    2. Controlled type 2 diabetes mellitus with stable proliferative retinopathy of both eyes, with long-term current use of insulin ([place])  Z16.9678    Z79.4       1.  Vastly improved visual acuity OD's after restoration of pseudophakic state via vitrectomy, Yamane scleral tunnel fixation of PCIOL, and then treatment of minor CME secondarily by using topical NSAID twice daily  I recommend use of this indefinitely  2.  3.  Ophthalmic Meds Ordered this visit:  No orders of the defined types were placed in this encounter.      Return in about 9 months (around 10/28/2021) for DILATE OU, OCT.  Patient Instructions  Patient instructed to continue to use topical  ketorolac 1 drop right eye twice daily indefinitely   Explained the diagnoses, plan, and follow up with the patient and they expressed understanding.  Patient expressed understanding of the importance of proper follow up care.   Clent Demark Miracle Criado M.D. Diseases & Surgery of the Retina and Vitreous Retina [facility] Diabetic [place] 01/28/21     Abbreviations: M myopia (nearsighted); A astigmatism; H hyperopia (farsighted); P presbyopia; Mrx spectacle prescription;  CTL contact lenses; OD right eye; OS left eye; OU both eyes  XT exotropia; ET esotropia; PEK punctate epithelial keratitis; PEE punctate epithelial erosions; DES dry eye syndrome; MGD meibomian gland dysfunction; ATs artificial tears; PFAT's preservative free artificial tears; [place] nuclear sclerotic cataract; PSC posterior subcapsular cataract; ERM epi-retinal membrane; PVD posterior vitreous detachment; RD retinal detachment; DM diabetes mellitus; DR diabetic retinopathy; NPDR non-proliferative diabetic retinopathy; PDR proliferative diabetic retinopathy; CSME clinically significant macular edema; DME diabetic macular edema; dbh dot blot hemorrhages; CWS cotton wool spot; POAG primary open angle glaucoma; C/D cup-to-disc ratio; HVF humphrey  visual field; GVF goldmann visual field; OCT optical coherence tomography; IOP intraocular pressure; BRVO Branch retinal vein occlusion; CRVO central retinal vein occlusion; CRAO central retinal artery occlusion; BRAO branch retinal artery occlusion; RT retinal tear; SB scleral buckle; PPV pars plana vitrectomy; VH Vitreous hemorrhage; PRP panretinal laser photocoagulation; IVK intravitreal kenalog; VMT vitreomacular traction; MH Macular hole;  NVD neovascularization of the disc; NVE neovascularization elsewhere; AREDS age related eye disease study; ARMD age related macular degeneration; POAG primary open angle glaucoma; EBMD epithelial/anterior basement membrane dystrophy; ACIOL anterior chamber intraocular lens; IOL intraocular lens; PCIOL posterior chamber intraocular lens; Phaco/IOL phacoemulsification with intraocular lens placement; [place] photorefractive keratectomy; LASIK laser assisted in situ keratomileusis; HTN hypertension; DM diabetes mellitus; COPD chronic obstructive pulmonary disease

## 2021-01-28 NOTE — Assessment & Plan Note (Signed)
Stable regressed disease, no active CME or CSME

## 2021-01-28 NOTE — Patient Instructions (Signed)
Patient instructed to continue to use topical ketorolac 1 drop right eye twice daily indefinitely

## 2021-01-28 NOTE — Assessment & Plan Note (Signed)
OD vastly improved acuity now on chronic suppressive NSAIDs using twice daily in an eye that required pseudophakic restoration via vitrectomy, IOL removal and insertion of Zeiss CT [place] lens via [place] scleral tunnel  NSAID likely prevents slight iris chafe leading to minor CME.  Improved acuity today

## 2021-01-29 ENCOUNTER — Telehealth: Payer: Medicare Other

## 2021-02-17 ENCOUNTER — Ambulatory Visit: Payer: Medicare Other

## 2021-02-27 ENCOUNTER — Telehealth: Payer: Medicare Other

## 2021-03-05 ENCOUNTER — Ambulatory Visit (INDEPENDENT_AMBULATORY_CARE_PROVIDER_SITE_OTHER): Payer: Medicare Other | Admitting: Licensed Clinical Social Worker

## 2021-03-05 DIAGNOSIS — H353134 Nonexudative age-related macular degeneration, bilateral, advanced atrophic with subfoveal involvement: Secondary | ICD-10-CM

## 2021-03-05 DIAGNOSIS — E1159 Type 2 diabetes mellitus with other circulatory complications: Secondary | ICD-10-CM

## 2021-03-05 DIAGNOSIS — E1169 Type 2 diabetes mellitus with other specified complication: Secondary | ICD-10-CM

## 2021-03-05 DIAGNOSIS — D631 Anemia in chronic kidney disease: Secondary | ICD-10-CM

## 2021-03-05 DIAGNOSIS — F32A Depression, unspecified: Secondary | ICD-10-CM

## 2021-03-05 DIAGNOSIS — M810 Age-related osteoporosis without current pathological fracture: Secondary | ICD-10-CM

## 2021-03-05 DIAGNOSIS — Z794 Long term (current) use of insulin: Secondary | ICD-10-CM

## 2021-03-05 DIAGNOSIS — N189 Chronic kidney disease, unspecified: Secondary | ICD-10-CM

## 2021-03-05 DIAGNOSIS — E034 Atrophy of thyroid (acquired): Secondary | ICD-10-CM

## 2021-03-05 DIAGNOSIS — N183 Chronic kidney disease, stage 3 unspecified: Secondary | ICD-10-CM

## 2021-03-05 DIAGNOSIS — I152 Hypertension secondary to endocrine disorders: Secondary | ICD-10-CM

## 2021-03-05 DIAGNOSIS — F411 Generalized anxiety disorder: Secondary | ICD-10-CM

## 2021-03-05 NOTE — Chronic Care Management (AMB) (Signed)
Chronic Care Management    Clinical Social Work Note  03/05/2021 Name: [name] MRN: [id number] DOB: 1945-10-18  [name] is a 75 y.o. year old female who is a primary care patient of [name], DO. The CCM team was consulted to assist the patient with chronic disease management and/or care coordination needs related to: [name] .   Engaged with patient by telephone for follow up visit in response to provider referral for social work chronic care management and care coordination services.   Consent to Services:  The patient was given information about Chronic Care Management services, agreed to services, and gave verbal consent prior to initiation of services.  Please see initial visit note for detailed documentation.   Patient agreed to services and consent obtained.   Assessment: Review of patient past medical history, allergies, medications, and health status, including review of relevant consultants reports was performed today as part of a comprehensive evaluation and provision of chronic care management and care coordination services.     SDOH (Social Determinants of Health) assessments and interventions performed:  SDOH Interventions    Flowsheet Row Most Recent Value  SDOH Interventions   Physical Activity Interventions Other (Comments)  [client has walking challenges,  she is legally blind, at risk for falls]  Stress Interventions Provide Counseling  [client has stress related to managing Diabetes]  Depression Interventions/Treatment  --  [informed client of LCSW support and of RNCM support]        Advanced Directives Status: See Vyn[employer] application for related entries.  CCM Care Plan  Allergies  Allergen Reactions   Aspirin Nausea And Vomiting   Ciprofloxacin Other (See Comments)    Upset Stomach   Codeine Nausea And Vomiting    Makes me sick    Losartan     Hyperkalemia    Micardis [Telmisartan] Other (See Comments)    unknown    Nexium [Esomeprazole Magnesium] Other (See Comments)    Causes internal bleeding   Niaspan [Niacin Er] Other (See Comments)    Reaction is unknown   Onglyza [Saxagliptin] Other (See Comments)    Reaction is unknown   Other     No otc pain medications   Rofecoxib Other (See Comments)    unknown   Simvastatin    Tequin [Gatifloxacin] Other (See Comments)    Reaction is unknown   Welchol [Colesevelam Hcl]    Amoxicillin Nausea And Vomiting and Rash    Has patient had a PCN reaction causing immediate rash, facial/tongue/throat swelling, SOB or lightheadedness with hypotension: Yes Has patient had a PCN reaction causing severe rash involving mucus membranes or skin necrosis: no  Has patient had a PCN reaction that required hospitalization No Has patient had a PCN reaction occurring within the last 10 years: No If all of the above answers are "NO", then may proceed with Cephalosporin use.     Outpatient Encounter Medications as of 03/05/2021  Medication Sig   ACCU-CHEK AVIVA PLUS test strip CHECK BLOOD SUGAR UP TO 4 TIMES A DAY   Accu-Chek Softclix Lancets lancets CHECK BLOOD SUGAR 2 TIMES A DAY   amLODipine (NORVASC) 5 MG tablet Take 1 tablet (5 mg total) by mouth daily.   atorvastatin (LIPITOR) 10 MG tablet Take 1 tablet (10 mg total) by mouth daily.   blood glucose meter kit and supplies Dispense based on patient and insurance preference. Use up to four times daily as directed. (FOR ICD-10 E10.9, E11.9).   carvedilol (COREG) 12.5  MG tablet Take 1 tablet (12.5 mg total) by mouth 2 (two) times daily with a meal.   Cholecalciferol 25 MCG (1000 UT) capsule Take 1 capsule (1,000 Units total) by mouth daily.   colchicine 0.6 MG tablet Take 0.6 mg by mouth daily as needed.   Febuxostat 80 MG TABS Take by mouth.   fluticasone (FLONASE) 50 MCG/ACT nasal spray Place 2 sprays into both nostrils daily. Use 2 sprays in each nostril daily   furosemide (LASIX) 40 MG tablet TAKE 1 TABLET TWICE  WEEKLY   Insulin Glargine (BASAGLAR KWIKPEN) 100 UNIT/ML Inject 8 Units into the skin daily.   ketorolac (ACULAR) 0.5 % ophthalmic solution Place 1 drop into the right eye every morning.   Lancets Misc. MISC Use as directed to check blood sugar up to 4x daily. E11.65   levothyroxine (SYNTHROID) 50 MCG tablet Take 1 tablet (50 mcg total) by mouth daily before breakfast.   pantoprazole (PROTONIX) 20 MG tablet Take 1 tablet (20 mg total) by mouth daily.   prednisoLONE acetate (PRED FORTE) 1 % ophthalmic suspension Place 1 drop into the right eye 4 (four) times daily.   ULTICARE MICRO PEN NEEDLES 32G X 4 MM MISC TWICE DAILY   No facility-administered encounter medications on file as of 03/05/2021.    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 10/02/2019   Retinal hemorrhage of left eye 10/02/2019   Retinal microaneurysm of both eyes 10/02/2019   Dislocated intraocular lens, initial encounter 10/02/2019   Vitamin D deficiency 11/15/2017   Foot ulcer ([place]) 06/02/2016   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 05/18/2016   Cellulitis 05/05/2016   Diabetic foot infection ([place]) 05/05/2016   Nausea and vomiting 01/23/2015   CKD (chronic kidney disease), stage III (HCC)    Anemia in chronic renal disease    Gastroesophageal reflux disease without esophagitis 10/11/2014   Hypothyroidism 05/18/2014   Hip fracture requiring operative repair [facility])    Fall    Osteoporosis 09/06/2013   Type 2 diabetes mellitus with stage 3b chronic kidney disease, with long-term current use of insulin ([place]) 07/20/2013   Edema 03/03/2011   Mixed  hyperlipidemia    Diabetic retinopathy ([place]) 10/28/2007   Hyperlipidemia associated with type 2 diabetes mellitus ([place]) 10/28/2007   Anxiety state 10/28/2007   Depression 10/28/2007   Essential hypertension 10/28/2007   Disorder resulting from impaired renal function 10/28/2007   COLONIC POLYPS, HYPERPLASTIC 11/17/2006   DIVERTICULOSIS, COLON 11/17/2006    Conditions to be addressed/monitored: monitor client completion of ADLs.  Care Plan : LCSW Care Plan  Updates made by Katha Cabal, LCSW since 03/05/2021 12:00 AM     Problem: Coping Skills (General Plan of Care)      Goal: Manage medical needs and complete ADLs daily   Start Date: 03/05/2021  Expected End Date: 05/29/2021  This Visit's Progress: On track  Recent Progress: On track  Priority: Medium  Note:   Current barriers:   Patient in need of assistance with connecting to community resources for assisting with vision needs and helping client complete ADLs Patient is unable to independently navigate community resource options without care coordination support Legally Blind  Mobility issues  Clinical Goals:  LCSW to communicate with client in next 30 days to discuss client management of medical needs and to discuss client completion of daily ADLs Client to attend scheduled medical appointments in next 30 days Client to communicate with RNCM or LCSW as needed in next 30 days for CCM support  Clinical Interventions:  Collaboration with [name], DO regarding development and update of comprehensive plan of care as evidenced by provider attestation and co-signature Discussed current needs with client today Discussed with client her use of CGM monitor. Client is very pleased with using her CGM and is feeling that use of this monitor gives her more medical independence. Reviewed with [name] services she has received from [facility] (client had said she received a watch, two magnifiers, flashlight, ink  pens, markers for microwave, writing tablet). Client has talked previously with Glenard Haring, [facility] representative.  With these items provided through [facility] and with CGM, client is now feeling more independent in managing some of her health needs.  Client also reported that Glenard Haring, representative for [facility] , called client on Monday to assess client needs. Since her needs for vision equipment are met currently, [facility] informed client on Monday of this week that Lexine Baton would close client case for [facility]. Client agreed to this plan Reviewed with client her management of Diabetes. She said she has appointment scheduled with Dr. Dorris Fetch , endocrinologist. Discussed with Lyndsie her socialization with others. She said she has several friends who call her regularly to check on her needs. She enjoys talking with these friends via phone.  Reminded Tarshia that she can call RNCM as needed for nursing support through [facility] program Provided counseling support for client Discussed with client the current mood status of client. She said she was in a positive mood. She said sometimes she is concerned over managing Diabetes. But , generally, she feels that her mood is stable and she has positive mood  Patient Activities  Listens to music Talks with family and friends via phone Communicates regularly with her spouse, [name]  Patient Coping Strengths: Takes medications as prescribed Attends scheduled medical appointments Has transport support from spouse, [name]  Patient Deficits:   Legally blind Mobility issues  Patient Goals:  Client to communicate with RNCM or LCSW as needed in next 30 days for CCM support Client to manage health needs faced and complete ADLs daily for next 30 days Communicate daily with spouse to discuss client needs -  Follow Up Plan:  LCSW  to call client or [name], spouse of client, on 04/24/21  at 1:00 PM to assess client needs      [name].Mena Simonis MSW, LCSW Licensed Clinical Social Worker [facility] Care Management [phone]

## 2021-03-05 NOTE — Patient Instructions (Signed)
Visit Information  PATIENT GOALS:  Goals Addressed               This Visit's Progress     Manage health needs faced; complete ADLs daily (pt-stated)        Timeframe:  Short-Term Goal Priority:  Medium Progress: On Track Start Date:    03/05/21                      Expected End Date:   05/29/21                    Follow Up Date  04/24/21 at 1:00 PM   Protect My Health (Patient)   Manage Health needs and complete ADLs daily    Why is this important?   Screening tests can find diseases early when they are easier to treat.  Your doctor or nurse will talk with you about which tests are important for you.  Getting shots for common diseases like the flu and shingles will help prevent them.    Patient Activities  Listens to music Talks with family and friends via phone Communicates regularly with her spouse, [name]  Patient Coping Strengths: Takes medications as prescribed Attends scheduled medical appointments Has transport support from spouse, [name]  Patient Deficits:   Legally blind Mobility issues  Patient Goals:  Client to communicate with RNCM or LCSW as needed in next 30 days for CCM support Client to manage health needs faced and complete ADLs daily for next 30 days Communicate daily with spouse to discuss client needs -  Follow Up Plan:  LCSW  to call client or [name], spouse of client,  on 04/24/21 at 1:00 PM  to assess client needs    [name].Julina Altmann MSW, LCSW Licensed Clinical Social Worker [facility] Care Management [phone]

## 2021-03-10 DIAGNOSIS — Z23 Encounter for immunization: Secondary | ICD-10-CM | POA: Diagnosis not present

## 2021-03-11 ENCOUNTER — Telehealth: Payer: Medicare Other

## 2021-03-20 ENCOUNTER — Telehealth: Payer: Self-pay

## 2021-03-20 NOTE — Telephone Encounter (Signed)
Spoke with pt about re-enrollment with [facility] for basaglar medication. Pt has not rec'd re-enrollment app from [facility] yet and would like for [place] to mail the patient pages to her home.

## 2021-03-23 DIAGNOSIS — E034 Atrophy of thyroid (acquired): Secondary | ICD-10-CM

## 2021-03-23 DIAGNOSIS — N189 Chronic kidney disease, unspecified: Secondary | ICD-10-CM | POA: Diagnosis not present

## 2021-03-23 DIAGNOSIS — E1122 Type 2 diabetes mellitus with diabetic chronic kidney disease: Secondary | ICD-10-CM | POA: Diagnosis not present

## 2021-03-23 DIAGNOSIS — E785 Hyperlipidemia, unspecified: Secondary | ICD-10-CM | POA: Diagnosis not present

## 2021-03-23 DIAGNOSIS — E1159 Type 2 diabetes mellitus with other circulatory complications: Secondary | ICD-10-CM

## 2021-03-23 DIAGNOSIS — N183 Chronic kidney disease, stage 3 unspecified: Secondary | ICD-10-CM

## 2021-03-23 DIAGNOSIS — Z794 Long term (current) use of insulin: Secondary | ICD-10-CM | POA: Diagnosis not present

## 2021-03-23 DIAGNOSIS — M810 Age-related osteoporosis without current pathological fracture: Secondary | ICD-10-CM

## 2021-03-23 DIAGNOSIS — D631 Anemia in chronic kidney disease: Secondary | ICD-10-CM

## 2021-03-23 DIAGNOSIS — I152 Hypertension secondary to endocrine disorders: Secondary | ICD-10-CM

## 2021-03-23 DIAGNOSIS — F32A Depression, unspecified: Secondary | ICD-10-CM | POA: Diagnosis not present

## 2021-03-23 DIAGNOSIS — E1169 Type 2 diabetes mellitus with other specified complication: Secondary | ICD-10-CM

## 2021-03-24 DIAGNOSIS — E1151 Type 2 diabetes mellitus with diabetic peripheral angiopathy without gangrene: Secondary | ICD-10-CM | POA: Diagnosis not present

## 2021-03-24 DIAGNOSIS — M79676 Pain in unspecified toe(s): Secondary | ICD-10-CM | POA: Diagnosis not present

## 2021-03-24 DIAGNOSIS — L84 Corns and callosities: Secondary | ICD-10-CM | POA: Diagnosis not present

## 2021-03-24 DIAGNOSIS — B351 Tinea unguium: Secondary | ICD-10-CM | POA: Diagnosis not present

## 2021-03-27 ENCOUNTER — Telehealth: Payer: Medicare Other

## 2021-04-01 ENCOUNTER — Ambulatory Visit (INDEPENDENT_AMBULATORY_CARE_PROVIDER_SITE_OTHER): Payer: Medicare Other | Admitting: Family Medicine

## 2021-04-01 ENCOUNTER — Encounter: Payer: Self-pay | Admitting: Family Medicine

## 2021-04-01 ENCOUNTER — Other Ambulatory Visit: Payer: Self-pay

## 2021-04-01 VITALS — BP 145/79 | HR 80 | Temp 97.2°F | Ht 62.0 in | Wt 150.0 lb

## 2021-04-01 DIAGNOSIS — E1159 Type 2 diabetes mellitus with other circulatory complications: Secondary | ICD-10-CM

## 2021-04-01 DIAGNOSIS — E785 Hyperlipidemia, unspecified: Secondary | ICD-10-CM

## 2021-04-01 DIAGNOSIS — Z794 Long term (current) use of insulin: Secondary | ICD-10-CM | POA: Diagnosis not present

## 2021-04-01 DIAGNOSIS — N183 Chronic kidney disease, stage 3 unspecified: Secondary | ICD-10-CM

## 2021-04-01 DIAGNOSIS — E1122 Type 2 diabetes mellitus with diabetic chronic kidney disease: Secondary | ICD-10-CM

## 2021-04-01 DIAGNOSIS — E1169 Type 2 diabetes mellitus with other specified complication: Secondary | ICD-10-CM | POA: Diagnosis not present

## 2021-04-01 DIAGNOSIS — E034 Atrophy of thyroid (acquired): Secondary | ICD-10-CM

## 2021-04-01 DIAGNOSIS — I152 Hypertension secondary to endocrine disorders: Secondary | ICD-10-CM | POA: Diagnosis not present

## 2021-04-01 DIAGNOSIS — J3089 Other allergic rhinitis: Secondary | ICD-10-CM

## 2021-04-01 LAB — BAYER DCA HB A1C WAIVED: HB A1C (BAYER DCA - WAIVED): 7.5 % — ABNORMAL HIGH (ref 4.8–5.6)

## 2021-04-01 MED ORDER — FLUTICASONE PROPIONATE 50 MCG/ACT NA SUSP: 2.0000 | Freq: Every day | NASAL | 3 refills | Status: DC

## 2021-04-01 NOTE — Progress Notes (Signed)
Subjective: CC:DM PCP: [name], DO EBX:IDHWY H Coste is a 75 y.o. female presenting to clinic today for:  1. Type 2 Diabetes with hypertension, hyperlipidemia w/ CKD3:  Admits that she was eating more because she was at home ill.  She worries that her blood sugar will be elevated.  She continues to follow-up with Dr. Dorris Fetch and has an appointment scheduled soon.  Compliant with all medications.  Last eye exam: Up-to-date Last foot exam: Up-to-date Last A1c:  Lab Results  Component Value Date   HGBA1C 7.4 (H) 10/01/2020   Nephropathy screen indicated?:  Due January 2023 Last flu, zoster and/or pneumovax:  Immunization History  Administered Date(s) Administered   Fluad Quad(high Dose 65+) 02/05/2019, 03/05/2020, 03/10/2021   Influenza, High Dose Seasonal PF 03/05/2016, 03/14/2017, 03/01/2018   Influenza,inj,Quad PF,6+ Mos 02/16/2013, 02/27/2015   Moderna Covid-19 Vaccine Bivalent Booster 43yr & up 03/10/2021   Moderna Sars-Covid-2 Vaccination 10/14/2020   PFIZER(Purple Top)SARS-COV-2 Vaccination 06/13/2019, 07/04/2019, 03/05/2020   Pneumococcal Conjugate-13 10/11/2014   Pneumococcal Polysaccharide-23 10/10/2012   Tdap 10/06/2012    ROS: Denies chest pain, shortness of breath, foot ulcerations  2. Hypothyroidism Compliant with Synthroid.  No reports of tremor, heart palpitations or change in bowel habits.   ROS: Per HPI  Allergies  Allergen Reactions   Aspirin Nausea And Vomiting   Ciprofloxacin Other (See Comments)    Upset Stomach   Codeine Nausea And Vomiting    Makes me sick    Losartan     Hyperkalemia    Micardis [Telmisartan] Other (See Comments)    unknown   Nexium [Esomeprazole Magnesium] Other (See Comments)    Causes internal bleeding   Niaspan [Niacin Er] Other (See Comments)    Reaction is unknown   Onglyza [Saxagliptin] Other (See Comments)    Reaction is unknown   Other     No otc pain medications   Rofecoxib Other (See Comments)     unknown   Simvastatin    Tequin [Gatifloxacin] Other (See Comments)    Reaction is unknown   Welchol [Colesevelam Hcl]    Amoxicillin Nausea And Vomiting and Rash    Has patient had a PCN reaction causing immediate rash, facial/tongue/throat swelling, SOB or lightheadedness with hypotension: Yes Has patient had a PCN reaction causing severe rash involving mucus membranes or skin necrosis: no  Has patient had a PCN reaction that required hospitalization No Has patient had a PCN reaction occurring within the last 10 years: No If all of the above answers are "NO", then may proceed with Cephalosporin use.    Past Medical History:  Diagnosis Date   Anal fistula    Anemia in chronic renal disease    Aranesp injection --  when Hg <11, last injection 12-24-14.   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    CKD (chronic kidney disease), stage III ([facility]    nephrologist--  dr mMercy Moore- [place]  [phone]  Complication of anesthesia    post-op confusion    Constipation    Coronary artery disease    [place]    [place] retin[place]    bilateral --  monitored by dr [name]   Diverticulosis of colon    GAD (generalized anxiety disorder)    GERD (gastroesophageal reflux disease)    History of colon polyps    benign   History of esophagitis    History of GI bleed    upper 2009  due to esophagitis  &  2002  due to Mallory-Weiss tear   History of hyperkalemia    pt had previously been canceled twice dos due to elevated K+, 07-29-2016 last date cancelled -- pt visited pcp same day treated w/ kayexelate and K+ came down, pt brought all her medication's in to  pcp office and found pt was taking losartan that had been discontinued due to ckd, pcp stated this was cause of elevated K+   History of Mallory-Weiss syndrome    12/ 2002--  resolved   History of rectal abscess    12-29-2004  bedside I & D   Hyperlipidemia    Hypertension    Hypothyroidism    LAFB (left anterior fascicular block)    Right bundle branch block    Sacral decubitus ulcer    since 2014- 01-02-15 remains with wound" gauze dressing changes daily.   Type II diabetes mellitus (HCC)     Current Outpatient Medications:    ACCU-CHEK AVIVA PLUS test strip, CHECK BLOOD SUGAR UP TO 4 TIMES A DAY, Disp: 200 strip, Rfl: 5   Accu-Chek Softclix Lancets lancets, CHECK BLOOD SUGAR 2 TIMES A DAY, Disp: 200 each, Rfl: 2   amLODipine (NORVASC) 5 MG tablet, Take 1 tablet (5 mg total) by mouth daily., Disp: 90 tablet, Rfl: 3   atorvastatin (LIPITOR) 10 MG tablet, Take 1 tablet (10 mg total) by mouth daily., Disp: 90 tablet, Rfl: 3   blood glucose meter kit and supplies, Dispense based on patient and insurance preference. Use up to four times daily as directed. (FOR ICD-10 E10.9, E11.9)., Disp: 1 each, Rfl: 0   carvedilol (COREG) 12.5 MG tablet, Take 1 tablet (12.5 mg total) by mouth 2 (two) times daily with a meal., Disp: 180 tablet, Rfl: 3   Cholecalciferol 25 MCG (1000 UT) capsule, Take 1 capsule (1,000 Units total) by mouth daily., Disp: 90 capsule, Rfl: 3   colchicine 0.6 MG tablet, Take 0.6 mg by mouth daily as needed., Disp: , Rfl:    Febuxostat 80 MG TABS, Take by mouth., Disp: , Rfl:    fluticasone (FLONASE) 50 MCG/ACT nasal spray, Place 2 sprays into both nostrils daily. Use 2 sprays in each nostril daily, Disp: 16 g, Rfl: 3   furosemide (LASIX) 40 MG tablet, TAKE 1 TABLET TWICE WEEKLY, Disp: 26 tablet, Rfl: 3   Insulin Glargine (BASAGLAR KWIKPEN) 100 UNIT/ML, Inject 8 Units into the skin daily., Disp: 9 mL, Rfl: 3   ketorolac (ACULAR) 0.5 % ophthalmic solution, Place 1 drop into the  right eye every morning., Disp: 5 mL, Rfl: 12   Lancets Misc. MISC, Use as directed to check blood sugar up to 4x daily. E11.65, Disp: 300 each, Rfl: 3   levothyroxine (SYNTHROID) 50 MCG tablet, Take 1 tablet (50 mcg total) by mouth daily before breakfast., Disp: 95 tablet, Rfl: 1   pantoprazole (PROTONIX) 20 MG tablet, Take 1 tablet (20 mg total) by mouth daily., Disp: 90 tablet, Rfl: 3   prednisoLONE acetate (PRED FORTE) 1 % ophthalmic suspension, Place 1 drop into the right eye 4 (four) times daily., Disp: 5 mL, Rfl: 0   ULTICARE MICRO PEN NEEDLES 32G X 4  MM MISC, TWICE DAILY, Disp: 100 each, Rfl: 5 Social History   Socioeconomic History   Marital status: Married    Spouse name: [name]   Number of children: 0   Years of education: 12   Highest education level: Not on file  Occupational History   Occupation: Retired    Fish farm manager: RETIRED  Tobacco Use   Smoking status: Never   Smokeless tobacco: Never  Vaping Use   Vaping Use: Never used  Substance and Sexual Activity   Alcohol use: No   Drug use: No   Sexual activity: Not Currently  Other Topics Concern   Not on file  Social History Narrative   Lives with husband.    Caffeine use: none   Social Determinants of Radio broadcast assistant Strain: Not on file  Food Insecurity: Not on file  Transportation Needs: Not on file  Physical Activity: Inactive   Days of Exercise per Week: 0 days   Minutes of Exercise per Session: 0 min  Stress: Stress Concern Present   Feeling of Stress : To some extent  Social Connections: Not on file  Intimate Partner Violence: Not on file   Family History  Problem Relation Age of Onset   Colon cancer Father 26   Prostate cancer Father    Diabetes Father    Coronary artery disease Mother 21   Heart disease Mother    Diabetes Mother    Coronary artery disease Sister 16   Diabetes Sister    Heart disease Sister    Diabetes Sister    Diabetes Sister    Diabetes Sister    Diabetes Sister     Diabetes Sister    Diabetes Maternal Grandmother    Esophageal cancer Neg Hx    Stomach cancer Neg Hx    Rectal cancer Neg Hx     Objective: Office vital signs reviewed. BP (!) 145/79   Pulse 80   Temp (!) 97.2 F (36.2 C)   Ht _0  (1.575 m)   Wt 150 lb (68 kg)   SpO2 96%   BMI 27.44 kg/m   Physical Examination:  General: Awake, alert, chronically ill-appearing female, No acute distress HEENT: Normal; sclera white.  No exophthalmos; clear fluid level noted behind the right TM Cardio: regular rate and rhythm, S1S2 heard, no murmurs appreciated Pulm: clear to auscultation bilaterally, no wheezes, rhonchi or rales; normal work of breathing on room air  Assessment/ Plan: 75 y.o. female   Type 2 diabetes mellitus with stage 3 chronic kidney disease, with long-term current use of insulin, unspecified whether stage 3a or 3b CKD ([place]) - Plan: Bayer DCA Hb A1c Waived, Renal Function Panel  Hypothyroidism due to acquired atrophy of thyroid - Plan: TSH, T4, Free  Hyperlipidemia associated with type 2 diabetes mellitus ([place])  Hypertension associated with diabetes ([place])  Non-seasonal allergic rhinitis due to other allergic trigger - Plan: fluticasone (FLONASE) 50 MCG/ACT nasal spray  Sugar essentially unchanged from previous.  Will CC Dr. Dorris Fetch with results.  Check renal function panel, TSH, free T4  Continue statin  Blood pressure technically under control for age but would like tighter control given CKD 3  Flonase sent for sinus symptoms and otalgia  No orders of the defined types were placed in this encounter.  No orders of the defined types were placed in this encounter.    [name], DO [place] [phone]

## 2021-04-02 LAB — T4, FREE: Free T4: 1.23 ng/dL (ref 0.82–1.77)

## 2021-04-02 LAB — RENAL FUNCTION PANEL
Albumin: 4.2 g/dL (ref 3.7–4.7)
BUN/Creatinine Ratio: 17 (ref 12–28)
BUN: 28 mg/dL — ABNORMAL HIGH (ref 8–27)
CO2: 22 mmol/L (ref 20–29)
Calcium: 9.2 mg/dL (ref 8.7–10.3)
Chloride: 102 mmol/L (ref 96–106)
Creatinine, Ser: 1.68 mg/dL — ABNORMAL HIGH (ref 0.57–1.00)
Glucose: 140 mg/dL — ABNORMAL HIGH (ref 70–99)
Phosphorus: 3.4 mg/dL (ref 3.0–4.3)
Potassium: 5.2 mmol/L (ref 3.5–5.2)
Sodium: 136 mmol/L (ref 134–144)
eGFR: 32 mL/min/{1.73_m2} — ABNORMAL LOW (ref >59)

## 2021-04-02 LAB — TSH: TSH: 5.53 u[IU]/mL — ABNORMAL HIGH (ref 0.450–4.500)

## 2021-04-03 ENCOUNTER — Ambulatory Visit (INDEPENDENT_AMBULATORY_CARE_PROVIDER_SITE_OTHER): Payer: Medicare Other

## 2021-04-03 VITALS — Ht 62.0 in | Wt 150.0 lb

## 2021-04-03 DIAGNOSIS — M2550 Pain in unspecified joint: Secondary | ICD-10-CM | POA: Insufficient documentation

## 2021-04-03 DIAGNOSIS — E663 Overweight: Secondary | ICD-10-CM | POA: Insufficient documentation

## 2021-04-03 DIAGNOSIS — Z0000 Encounter for general adult medical examination without abnormal findings: Secondary | ICD-10-CM | POA: Diagnosis not present

## 2021-04-03 DIAGNOSIS — M1A09X1 Idiopathic chronic gout, multiple sites, with tophus (tophi): Secondary | ICD-10-CM | POA: Insufficient documentation

## 2021-04-03 NOTE — Patient Instructions (Signed)
[name] , Thank you for taking time to come for your Medicare Wellness Visit. I appreciate your ongoing commitment to your health goals. Please review the following plan we discussed and let me know if I can assist you in the future.   Screening recommendations/referrals: Colonoscopy: Done 11/16/2018 - repeat in 3 years Mammogram: Due Bone Density: Done 10/27/2017 - Repeat every 2 years *due Recommended yearly ophthalmology/optometry visit for glaucoma screening and checkup Recommended yearly dental visit for hygiene and checkup  Vaccinations: Influenza vaccine: Done 03/10/2021 - Repeat annually  Pneumococcal vaccine: Done 10/10/2012 & 10/11/2014 Tdap vaccine: Done 10/06/2012 - Repeat in 10 years  Shingles vaccine: Due   Covid-19: Done 06/13/2019, 07/04/219, 03/05/2020, 10/14/2020 & 03/10/2021  Advanced directives: Advance directive discussed with you today. Even though you declined this today, please call our office should you change your mind, and we can give you the proper paperwork for you to fill out.   Conditions/risks identified: Aim for 30 minutes of exercise or brisk walking each day, drink 6-8 glasses of water and eat lots of fruits and vegetables.   Next appointment: Follow up in one year for your annual wellness visit    Preventive Care 65 Years and Older, Female Preventive care refers to lifestyle choices and visits with your health care provider that can promote health and wellness. What does preventive care include? A yearly physical exam. This is also called an annual well check. Dental exams once or twice a year. Routine eye exams. Ask your health care provider how often you should have your eyes checked. Personal lifestyle choices, including: Daily care of your teeth and gums. Regular physical activity. Eating a healthy diet. Avoiding tobacco and drug use. Limiting alcohol use. Practicing safe sex. Taking low-dose aspirin every day. Taking vitamin and mineral  supplements as recommended by your health care provider. What happens during an annual well check? The services and screenings done by your health care provider during your annual well check will depend on your age, overall health, lifestyle risk factors, and family history of disease. Counseling  Your health care provider may ask you questions about your: Alcohol use. Tobacco use. Drug use. Emotional well-being. Home and relationship well-being. Sexual activity. Eating habits. History of falls. Memory and ability to understand (cognition). Work and work Statistician. Reproductive health. Screening  You may have the following tests or measurements: Height, weight, and BMI. Blood pressure. Lipid and cholesterol levels. These may be checked every 5 years, or more frequently if you are over 31 years old. Skin check. Lung cancer screening. You may have this screening every year starting at age 74 if you have a 30-pack-year history of smoking and currently smoke or have quit within the past 15 years. Fecal occult blood test (FOBT) of the stool. You may have this test every year starting at age 22. Flexible sigmoidoscopy or colonoscopy. You may have a sigmoidoscopy every 5 years or a colonoscopy every 10 years starting at age 53. Hepatitis C blood test. Hepatitis B blood test. Sexually transmitted disease (STD) testing. Diabetes screening. This is done by checking your blood sugar (glucose) after you have not eaten for a while (fasting). You may have this done every 1-3 years. Bone density scan. This is done to screen for osteoporosis. You may have this done starting at age 41. Mammogram. This may be done every 1-2 years. Talk to your health care provider about how often you should have regular mammograms. Talk with your health care provider about your  test results, treatment options, and if necessary, the need for more tests. Vaccines  Your health care provider may recommend certain  vaccines, such as: Influenza vaccine. This is recommended every year. Tetanus, diphtheria, and acellular pertussis (Tdap, Td) vaccine. You may need a Td booster every 10 years. Zoster vaccine. You may need this after age 64. Pneumococcal 13-valent conjugate (PCV13) vaccine. One dose is recommended after age 20. Pneumococcal polysaccharide (PPSV23) vaccine. One dose is recommended after age 34. Talk to your health care provider about which screenings and vaccines you need and how often you need them. This information is not intended to replace advice given to you by your health care provider. Make sure you discuss any questions you have with your health care provider. Document Released: 06/06/2015 Document Revised: 01/28/2016 Document Reviewed: 03/11/2015 Elsevier Interactive Patient Education  2017 [place] Prevention in the Home Falls can cause injuries. They can happen to people of all ages. There are many things you can do to make your home safe and to help prevent falls. What can I do on the outside of my home? Regularly fix the edges of walkways and driveways and fix any cracks. Remove anything that might make you trip as you walk through a door, such as a raised step or threshold. Trim any bushes or trees on the path to your home. Use bright outdoor lighting. Clear any walking paths of anything that might make someone trip, such as rocks or tools. Regularly check to see if handrails are loose or broken. Make sure that both sides of any steps have handrails. Any raised decks and porches should have guardrails on the edges. Have any leaves, snow, or ice cleared regularly. Use sand or salt on walking paths during winter. Clean up any spills in your garage right away. This includes oil or grease spills. What can I do in the bathroom? Use night lights. Install grab bars by the toilet and in the tub and shower. Do not use towel bars as grab bars. Use non-skid mats or decals in  the tub or shower. If you need to sit down in the shower, use a plastic, non-slip stool. Keep the floor dry. Clean up any water that spills on the floor as soon as it happens. Remove soap buildup in the tub or shower regularly. Attach bath mats securely with double-sided non-slip rug tape. Do not have throw rugs and other things on the floor that can make you trip. What can I do in the bedroom? Use night lights. Make sure that you have a light by your bed that is easy to reach. Do not use any sheets or blankets that are too big for your bed. They should not hang down onto the floor. Have a firm chair that has side arms. You can use this for support while you get dressed. Do not have throw rugs and other things on the floor that can make you trip. What can I do in the kitchen? Clean up any spills right away. Avoid walking on wet floors. Keep items that you use a lot in easy-to-reach places. If you need to reach something above you, use a strong step stool that has a grab bar. Keep electrical cords out of the way. Do not use floor polish or wax that makes floors slippery. If you must use wax, use non-skid floor wax. Do not have throw rugs and other things on the floor that can make you trip. What can I do with my  stairs? Do not leave any items on the stairs. Make sure that there are handrails on both sides of the stairs and use them. Fix handrails that are broken or loose. Make sure that handrails are as long as the stairways. Check any carpeting to make sure that it is firmly attached to the stairs. Fix any carpet that is loose or worn. Avoid having throw rugs at the top or bottom of the stairs. If you do have throw rugs, attach them to the floor with carpet tape. Make sure that you have a light switch at the top of the stairs and the bottom of the stairs. If you do not have them, ask someone to add them for you. What else can I do to help prevent falls? Wear shoes that: Do not have high  heels. Have rubber bottoms. Are comfortable and fit you well. Are closed at the toe. Do not wear sandals. If you use a stepladder: Make sure that it is fully opened. Do not climb a closed stepladder. Make sure that both sides of the stepladder are locked into place. Ask someone to hold it for you, if possible. Clearly mark and make sure that you can see: Any grab bars or handrails. First and last steps. Where the edge of each step is. Use tools that help you move around (mobility aids) if they are needed. These include: Canes. Walkers. Scooters. Crutches. Turn on the lights when you go into a dark area. Replace any light bulbs as soon as they burn out. Set up your furniture so you have a clear path. Avoid moving your furniture around. If any of your floors are uneven, fix them. If there are any pets around you, be aware of where they are. Review your medicines with your doctor. Some medicines can make you feel dizzy. This can increase your chance of falling. Ask your doctor what other things that you can do to help prevent falls. This information is not intended to replace advice given to you by your health care provider. Make sure you discuss any questions you have with your health care provider. Document Released: 03/06/2009 Document Revised: 10/16/2015 Document Reviewed: 06/14/2014 Elsevier Interactive Patient Education  2017 [place]. 5/24/

## 2021-04-03 NOTE — Progress Notes (Signed)
Subjective:   [name] is a 75 y.o. female who presents for Medicare Annual (Subsequent) preventive examination.  Virtual Visit via Telephone Note  I connected with  SHONICA WEIER on 04/03/21 at  2:45 PM EST by telephone and verified that I am speaking with the correct person using two identifiers.  Location: Patient: Home Provider: [facility] Persons participating in the virtual visit: patient/Nurse Health Advisor   I discussed the limitations, risks, security and privacy concerns of performing an evaluation and management service by telephone and the availability of in person appointments. The patient expressed understanding and agreed to proceed.  Interactive audio and video telecommunications were attempted between this nurse and patient, however failed, due to patient having technical difficulties OR patient did not have access to video capability.  We continued and completed visit with audio only.  Some vital signs may be absent or patient reported.   Kainen Struckman E Maliaka Brasington, LPN   Review of Systems     Cardiac Risk Factors include: advanced age (>24mn, >>80women);diabetes mellitus;dyslipidemia;hypertension;sedentary lifestyle     Objective:    Today's Vitals   04/03/21 1524 04/03/21 1526  Weight: 150 lb (68 kg)   Height: '5\' 2"'  (1.575 m)   PainSc:  8    Body mass index is 27.44 kg/m.  Advanced Directives 04/03/2021 05/01/2019 10/12/2017 06/28/2017 06/24/2017 10/22/2016 07/29/2016  Does Patient Have a Medical Advance Directive? No No No - Yes No No  Type of Advance Directive - - - - HOlathe- -  Does patient want to make changes to medical advance directive? - No - Patient declined - No - Patient declined No - Patient declined - -  Would patient like information on creating a medical advance directive? No - Patient declined - No - Patient declined - - No - Patient declined No - Patient declined    Current Medications (verified) Outpatient Encounter  Medications as of 04/03/2021  Medication Sig   amLODipine (NORVASC) 5 MG tablet Take 1 tablet (5 mg total) by mouth daily.   atorvastatin (LIPITOR) 10 MG tablet Take 1 tablet (10 mg total) by mouth daily.   carvedilol (COREG) 12.5 MG tablet Take 1 tablet (12.5 mg total) by mouth 2 (two) times daily with a meal.   Cholecalciferol 25 MCG (1000 UT) capsule Take 1 capsule (1,000 Units total) by mouth daily.   colchicine 0.6 MG tablet Take 0.6 mg by mouth daily as needed.   Febuxostat 80 MG TABS Take by mouth.   fluticasone (FLONASE) 50 MCG/ACT nasal spray Place 2 sprays into both nostrils daily. Use 2 sprays in each nostril daily   furosemide (LASIX) 40 MG tablet TAKE 1 TABLET TWICE WEEKLY   Insulin Glargine (BASAGLAR KWIKPEN) 100 UNIT/ML Inject 8 Units into the skin daily.   ketorolac (ACULAR) 0.5 % ophthalmic solution Place 1 drop into the right eye every morning.   levothyroxine (SYNTHROID) 50 MCG tablet Take 1 tablet (50 mcg total) by mouth daily before breakfast.   pantoprazole (PROTONIX) 20 MG tablet Take 1 tablet (20 mg total) by mouth daily.   prednisoLONE acetate (PRED FORTE) 1 % ophthalmic suspension Place 1 drop into the right eye 4 (four) times daily.   ACCU-CHEK AVIVA PLUS test strip CHECK BLOOD SUGAR UP TO 4 TIMES A DAY (Patient not taking: Reported on 04/03/2021)   Accu-Chek Softclix Lancets lancets CHECK BLOOD SUGAR 2 TIMES A DAY (Patient not taking: Reported on 04/03/2021)   blood glucose meter kit and  supplies Dispense based on patient and insurance preference. Use up to four times daily as directed. (FOR ICD-10 E10.9, E11.9). (Patient not taking: Reported on 04/03/2021)   Lancets Misc. MISC Use as directed to check blood sugar up to 4x daily. E11.65 (Patient not taking: Reported on 04/03/2021)   ULTICARE MICRO PEN NEEDLES 32G X 4 MM MISC TWICE DAILY (Patient not taking: Reported on 04/03/2021)   No facility-administered encounter medications on file as of 04/03/2021.     Allergies (verified) Aspirin, Ciprofloxacin, Codeine, Losartan, Micardis [telmisartan], Nexium [esomeprazole magnesium], Niaspan [niacin er], Onglyza [saxagliptin], Other, Rofecoxib, Simvastatin, Tequin [gatifloxacin], Welchol [colesevelam hcl], and Amoxicillin   History: Past Medical History:  Diagnosis Date   Anal fistula    Anemia in chronic renal disease    Aranesp injection --  when Hg <11, last injection 12-24-14.   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    CKD (chronic kidney disease), stage III [facility])    nephrologist--  dr Mercy Moore-- [facility]  [phone]   Complication of anesthesia    post-op confusion    Constipation    Coronary artery disease    Diabetic gastroparesis ([place])    Diabetic retinopathy ([place])    bilateral --  monitored by dr [name]   Diverticulosis of colon    GAD (generalized anxiety disorder)    GERD (gastroesophageal reflux disease)    History of colon polyps    benign   History of esophagitis    History of GI bleed    upper 2009  due to esophagitis  &  2002  due to Mallory-Weiss tear   History of hyperkalemia    pt had previously been canceled twice dos due to elevated K+, 07-29-2016 last date cancelled -- pt visited pcp same day treated w/ kayexelate and K+ came down, pt brought all her medication's in to pcp office and found pt was taking losartan that had been discontinued due to ckd, pcp stated this was cause of elevated K+   History of Mallory-Weiss syndrome    12/ 2002--  resolved   History of rectal abscess    12-29-2004  bedside I & D   Hyperlipidemia    Hypertension    Hypothyroidism    LAFB (left anterior fascicular block)     Right bundle branch block    Sacral decubitus ulcer    since 2014- 01-02-15 remains with wound" gauze dressing changes daily.   Type II diabetes mellitus ([place])    Past Surgical History:  Procedure Laterality Date   APPLICATION OF A-CELL OF EXTREMITY N/A 04/07/2015   Procedure: A CELL PLACMENT;  Surgeon: Loel Lofty Dillingham, DO;  Location: [facility];  Service: Plastics;  Laterality: N/A;   CARDIOVASCULAR STRESS TEST  12-30-2004   normal perfusion study/  no ischemia or infartion/  normal LV wall function and wall motion , ef 66%   CATARACT EXTRACTION W/ INTRAOCULAR LENS  IMPLANT, BILATERAL  1995   COLONOSCOPY  2008   w/Dr.Brodie    COLONOSCOPY W/ POLYPECTOMY  last one 2008   COMPRESSION HIP SCREW Right 05/18/2014   Procedure: COMPRESSION HIP;  Surgeon: Carole Civil, MD;  Location: [facility];  Service: Orthopedics;  Laterality: Right;   ESOPHAGOGASTRODUODENOSCOPY  last one 01-09-2011   EVALUATION UNDER ANESTHESIA WITH FISTULECTOMY N/A 01/06/2015   Procedure: EXAM UNDER ANESTHESIA , placement of seton;  Surgeon: Jackolyn Confer, MD;  Location: [facility];  Service: General;  Laterality: N/A;   I & D EXTREMITY N/A 04/07/2015   Procedure: IRRIGATION AND DEBRIDEMENT ISCHIAL ULCER;  Surgeon: Loel Lofty Dillingham, DO;  Location: [facility];  Service: Plastics;  Laterality: N/A;   INCISION AND DRAINAGE OF WOUND N/A 09/30/2014   Procedure: IRRIGATION AND DEBRIDEMENT SACRAL WOUND, EXCISION OF PERIRECTAL TRACT WITH PLACEMENT OF ACCELL;  Surgeon: Theodoro Kos, DO;  Location: [facility];  Service: Plastics;  Laterality: N/A;   LIGATION OF INTERNAL FISTULA TRACT N/A 10/22/2016   Procedure: LIGATION OF INTERNAL FISTULA TRACT;  Surgeon: Leighton Ruff, MD;  Location[facility];  Service: General;  Laterality: N/A;   ORIF FEMUR FRACTURE Left 10/09/2012   Procedure: OPEN REDUCTION INTERNAL FIXATION (ORIF) DISTAL FEMUR FRACTURE;  Surgeon: Rozanna Box, MD;  Location: [facility];  Service:  Orthopedics;  Laterality: Left;   RETINOPATHY SURGERY Bilateral 1980's?   TRANSTHORACIC ECHOCARDIOGRAM  02-18-2011   mild LVH,  ef 55-60%,  grade I diastolic dysfunction/  mild TR/  RV systolic pressure increased consistant with moderate pulmonary hypertension   Family History  Problem Relation Age of Onset   Colon cancer Father 54   Prostate cancer Father    Diabetes Father    Coronary artery disease Mother 54   Heart disease Mother    Diabetes Mother    Coronary artery disease Sister 36   Diabetes Sister    Heart disease Sister    Diabetes Sister    Diabetes Sister    Diabetes Sister    Diabetes Sister    Diabetes Sister    Diabetes Maternal Grandmother    Esophageal cancer Neg Hx    Stomach cancer Neg Hx    Rectal cancer Neg Hx    Social History   Socioeconomic History   Marital status: Married    Spouse name: [name]   Number of children: 0   Years of education: 12   Highest education level: Not on file  Occupational History   Occupation: Retired    Fish farm manager: RETIRED  Tobacco Use   Smoking status: Never   Smokeless tobacco: Never  Vaping Use   Vaping Use: Never used  Substance and Sexual Activity   Alcohol use: No   Drug use: No   Sexual activity: Not Currently  Other Topics Concern   Not on file  Social History Narrative   Lives with husband.    Caffeine use: none   Social Determinants of Radio broadcast assistant Strain: Low Risk    Difficulty of Paying Living Expenses: Not hard at all  Food Insecurity: No Food Insecurity   Worried About Charity fundraiser in the Last Year: Never true   Arboriculturist in the Last Year: Never true  Transportation Needs: No Transportation Needs   Lack of Transportation (Medical): No   Lack of Transportation (Non-Medical): No  Physical Activity: Inactive   Days of Exercise per Week: 0 days   Minutes of Exercise per Session: 0 min  Stress: Stress Concern Present   Feeling of Stress : To some extent  Social  Connections: Moderately Isolated   Frequency of Communication with Friends and Family: More than three times a week   Frequency of Social Gatherings with  Friends and Family: More than three times a week   Attends Religious Services: Never   Marine scientist or Organizations: No   Attends Music therapist: Never   Marital Status: Married    Tobacco Counseling Counseling given: Not Answered   Clinical Intake:  Pre-visit preparation completed: Yes  Pain : 0-10 Pain Score: 8  Pain Type: Chronic pain Pain Location: Generalized Pain Descriptors / Indicators: Aching, Discomfort Pain Onset: More than a month ago Pain Frequency: Intermittent     BMI - recorded: 27.44 Nutritional Status: BMI 25 -29 Overweight Nutritional Risks: None Diabetes: Yes CBG done?: No Did pt. bring in CBG monitor from home?: No  How often do you need to have someone help you when you read instructions, pamphlets, or other written materials from your doctor or pharmacy?: 1 - Never  Diabetic? Yes Nutrition Risk Assessment:  Has the patient had any N/V/D within the last 2 months?  No  Does the patient have any non-healing wounds?  No  Has the patient had any unintentional weight loss or weight gain?  No   Diabetes:  Is the patient diabetic?  Yes  If diabetic, was a CBG obtained today?  No  Did the patient bring in their glucometer from home?  No  How often do you monitor your CBG's? Has freestyle libre - checks about 4x per day.   Financial Strains and Diabetes Management:  Are you having any financial strains with the device, your supplies or your medication? No .  Does the patient want to be seen by Chronic Care Management for management of their diabetes?  No  Would the patient like to be referred to a Nutritionist or for Diabetic Management?  No   Diabetic Exams:  Diabetic Eye Exam: Completed 01/13/2021.   Diabetic Foot Exam: Completed 05/28/2020. Pt has been advised about  the importance in completing this exam. Pt is scheduled for diabetic foot exam on 06/2021.    Interpreter Needed?: No  Information entered by :: Alen Matheson, LPN   Activities of Daily Living In your present state of health, do you have any difficulty performing the following activities: 04/03/2021  Hearing? N  Vision? Y  Difficulty concentrating or making decisions? N  Walking or climbing stairs? Y  Comment can't see - has to be very careful  Dressing or bathing? N  Doing errands, shopping? Y  Preparing Food and eating ? Y  Using the Toilet? N  In the past six months, have you accidently leaked urine? N  Do you have problems with loss of bowel control? N  Managing your Medications? Y  Managing your Finances? Y  Housekeeping or managing your Housekeeping? Y  Some recent data might be hidden    Patient Care Team: [name], DO as PCP - General (Family Medicine) [name] Clent Demark, MD as Consulting Physician (Ophthalmology) Fleet Contras, MD as Consulting Physician (Nephrology) Minus Breeding, MD as Consulting Physician (Cardiology) Lafayette Dragon, MD (Inactive) as Consulting Physician (Gastroenterology) Ilean China, RN as Case Manager Shea Evans [name], LCSW as [place] [facility] (Licensed Clinical Social Worker) Blanca Friend, Royce Macadamia, [facility] (Pharmacist)  Indicate any recent Medical Services you may have received from other than [facility] providers in the past year (date may be approximate).     Assessment:   This is a routine wellness examination for [place].  Hearing/Vision screen Hearing Screening - Comments:: Denies hearing difficulties Vision Screening - Comments:: Has vision difficulties, but glasses  don't help - up to date with annual eye exams with Deloria Lair   Dietary issues and exercise activities discussed: Current Exercise Habits: The patient does not participate in regular exercise at present, Exercise limited by: Other - see  comments;orthopedic condition(s) (blind)   Goals Addressed             This Visit's Progress    Manage my Diabetes   On track    Timeframe:  Long-Range Goal Priority:  Medium Start Date:  09/29/20                           Expected End Date:   09/29/21                      Follow Up Date 12/18/20   Check blood sugar with CGM as instructed and if you feel it is too high or too low Eat an ADA/carb modified diet Call PCP or endocrinologist with any blood sugar readings outside of recommended range Keep all medical appointments Take all medications as directed Call RN Care manager as needed [phone]   Why is this important?   Checking your blood sugar at home helps to keep it from getting very high or very low.  Writing the results in a diary or log helps the doctor know how to care for you.  Your blood sugar log should have the time, date and the results.  Also, write down the amount of insulin or other medicine that you take.  Other information, like what you ate, exercise done and how you were feeling, will also be helpful.     Notes:      Manage My Emotions   On track      Depression Screen PHQ 2/9 Scores 04/03/2021 04/01/2021 03/05/2021 01/21/2021 12/12/2020 10/30/2020 10/01/2020  PHQ - 2 Score 0 0 '2 2 2 2 ' 0  PHQ- 9 Score 0 0 '6 7 7 4 ' 0    Fall Risk Fall Risk  04/03/2021 04/01/2021 05/28/2020 01/30/2020 12/11/2019  Falls in the past year? 0 0 0 0 0  Number falls in past yr: 0 - - - 0  Injury with Fall? 0 - - - 0  Comment - - - - -  Risk Factor Category  - - - - -  Risk for fall due to : Impaired balance/gait;History of fall(s);Impaired vision - - - Impaired vision;Medication side effect;Impaired balance/gait  Follow up Falls prevention discussed;Education provided - - - Falls evaluation completed;Education provided;Falls prevention discussed    FALL RISK PREVENTION PERTAINING TO THE HOME:  Any stairs in or around the home? No  If so, are there any without handrails? No   Home free of loose throw rugs in walkways, pet beds, electrical cords, etc? Yes  Adequate lighting in your home to reduce risk of falls? Yes   ASSISTIVE DEVICES UTILIZED TO PREVENT FALLS:  Life alert? No  Use of a cane, walker or w/c? Yes  Grab bars in the bathroom? Yes  Shower chair or bench in shower? Yes  Elevated toilet seat or a handicapped toilet? Yes   TIMED UP AND GO:  Was the test performed? No . Telephonic visit.  Cognitive Function:      6CIT Screen 04/03/2021  What Year? 0 points  What month? 0 points  What time? 0 points  Count back from 20 0 points  Months in reverse 2 points  Repeat phrase 2 points  Total Score 4    Immunizations Immunization History  Administered Date(s) Administered   Fluad Quad(high Dose 65+) 02/05/2019, 03/05/2020, 03/10/2021   Influenza, High Dose Seasonal PF 03/05/2016, 03/14/2017, 03/01/2018   Influenza,inj,Quad PF,6+ Mos 02/16/2013, 02/27/2015   Moderna Covid-19 Vaccine Bivalent Booster 10yr & up 03/10/2021   Moderna Sars-Covid-2 Vaccination 10/14/2020   PFIZER(Purple Top)SARS-COV-2 Vaccination 06/13/2019, 07/04/2019, 03/05/2020   Pneumococcal Conjugate-13 10/11/2014   Pneumococcal Polysaccharide-23 10/10/2012   Tdap 10/06/2012    TDAP status: Up to date  Flu Vaccine status: Up to date  Pneumococcal vaccine status: Up to date  Covid-19 vaccine status: Completed vaccines  Qualifies for Shingles Vaccine? Yes   Zostavax completed Yes   Shingrix Completed?: No.    Education has been provided regarding the importance of this vaccine. Patient has been advised to call insurance company to determine out of pocket expense if they have not yet received this vaccine. Advised may also receive vaccine at local pharmacy or Health Dept. Verbalized acceptance and understanding.  Screening Tests Health Maintenance  Topic Date Due   OPHTHALMOLOGY EXAM  12/26/2019   URINE MICROALBUMIN  05/28/2021   Zoster Vaccines- Shingrix (1 of 2)  07/02/2021 (Originally 06/03/1964)   FOOT EXAM  05/28/2021   HEMOGLOBIN A1C  09/29/2021   COLONOSCOPY (Pts 45-464yrInsurance coverage will need to be confirmed)  11/15/2021   TETANUS/TDAP  10/07/2022   Pneumonia Vaccine 75Years old  Completed   INFLUENZA VACCINE  Completed   DEXA SCAN  Completed   COVID-19 Vaccine  Completed   Hepatitis C Screening  Completed   HPV VACCINES  Aged Out    Health Maintenance  Health Maintenance Due  Topic Date Due   OPHTHALMOLOGY EXAM  12/26/2019   URINE MICROALBUMIN  05/28/2021    Colorectal cancer screening: Type of screening: Colonoscopy. Completed 11/16/2018. Repeat every 3 years  Mammogram status: No longer required due to declined.  Bone Density status: Completed 10/27/2017. Results reflect: Bone density results: OSTEOPOROSIS. Repeat every 2 years.  Lung Cancer Screening: (Low Dose CT Chest recommended if Age 75-80ears, 30 pack-year currently smoking OR have quit w/in 15years.) does not qualify.   Additional Screening:  Hepatitis C Screening: does qualify; Completed 06/03/2015  Vision Screening: Recommended annual ophthalmology exams for early detection of glaucoma and other disorders of the eye. Is the patient up to date with their annual eye exam?  Yes  Who is the provider or what is the name of the office in which the patient attends annual eye exams? [facility] If pt is not established with a provider, would they like to be referred to a provider to establish care? No .   Dental Screening: Recommended annual dental exams for proper oral hygiene  Community Resource Referral / Chronic Care Management: CRR required this visit?  No   CCM required this visit?  No      Plan:     I have personally reviewed and noted the following in the patient's chart:   Medical and social history Use of alcohol, tobacco or illicit drugs  Current medications and supplements including opioid prescriptions.  Functional ability and status Nutritional  status Physical activity Advanced directives List of other physicians Hospitalizations, surgeries, and ER visits in previous 12 months Vitals Screenings to include cognitive, depression, and falls Referrals and appointments  In addition, I have reviewed and discussed with patient certain preventive protocols, quality metrics, and best practice recommendations. A written personalized care plan for preventive services as well as general preventive  health recommendations were provided to patient.     Sandrea Hammond, LPN   [id number]   Nurse Notes: none

## 2021-04-21 ENCOUNTER — Ambulatory Visit (INDEPENDENT_AMBULATORY_CARE_PROVIDER_SITE_OTHER): Payer: Medicare Other | Admitting: "Endocrinology

## 2021-04-21 ENCOUNTER — Other Ambulatory Visit: Payer: Self-pay

## 2021-04-21 ENCOUNTER — Encounter: Payer: Self-pay | Admitting: "Endocrinology

## 2021-04-21 VITALS — BP 140/66 | HR 64 | Ht 62.0 in | Wt 149.4 lb

## 2021-04-21 DIAGNOSIS — E039 Hypothyroidism, unspecified: Secondary | ICD-10-CM | POA: Diagnosis not present

## 2021-04-21 DIAGNOSIS — E1122 Type 2 diabetes mellitus with diabetic chronic kidney disease: Secondary | ICD-10-CM

## 2021-04-21 DIAGNOSIS — E782 Mixed hyperlipidemia: Secondary | ICD-10-CM

## 2021-04-21 DIAGNOSIS — I10 Essential (primary) hypertension: Secondary | ICD-10-CM | POA: Diagnosis not present

## 2021-04-21 DIAGNOSIS — Z794 Long term (current) use of insulin: Secondary | ICD-10-CM

## 2021-04-21 DIAGNOSIS — E034 Atrophy of thyroid (acquired): Secondary | ICD-10-CM | POA: Diagnosis not present

## 2021-04-21 DIAGNOSIS — N1832 Chronic kidney disease, stage 3b: Secondary | ICD-10-CM

## 2021-04-21 MED ORDER — LEVOTHYROXINE SODIUM 75 MCG PO TABS: 75.0000 ug | ORAL_TABLET | Freq: Every day | ORAL | 1 refills | Status: DC

## 2021-04-21 NOTE — Progress Notes (Signed)
04/21/2021   Endocrinology follow-up note   Subjective:    Patient ID: [name], female    DOB: 19-Sep-1945.  She is being seen in follow-up in the management of uncontrolled type 2 diabetes, hypothyroidism, hypertension, hyperlipidemia.  Past Medical History:  Diagnosis Date   Anal fistula    Anemia in chronic renal disease    Aranesp injection --  when Hg <11, last injection 12-24-14.   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    CKD (chronic kidney disease), stage III [facility])    nephrologist--  dr Mercy Moore-- LOV  [phone]   Complication of anesthesia    post-op confusion    Constipation    Coronary artery disease    Diabetic gastroparesis ([place])    Diabetic retinopathy ([place])    bilateral --  monitored by dr [name]   Diverticulosis of colon    GAD (generalized anxiety disorder)    GERD (gastroesophageal reflux disease)    History of colon polyps    benign   History of esophagitis    History of GI bleed    upper 2009  due to esophagitis  &  2002  due to Mallory-Weiss tear   History of hyperkalemia    pt had previously been canceled twice dos due to elevated K+, 07-29-2016 last date cancelled -- pt visited pcp same day treated w/ kayexelate and K+ came down, pt brought all her medication's in to pcp office and found pt was taking losartan that had been discontinued due to ckd, pcp stated this was cause of elevated K+   History of Mallory-Weiss syndrome    12/ 2002--  resolved   History of rectal abscess    12-29-2004  bedside I & D   Hyperlipidemia    Hypertension    Hypothyroidism    LAFB (left anterior fascicular block)    Right bundle branch block     Sacral decubitus ulcer    since 2014- 01-02-15 remains with wound" gauze dressing changes daily.   Type II diabetes mellitus ([place])    Past Surgical History:  Procedure Laterality Date   APPLICATION OF A-CELL OF EXTREMITY N/A 04/07/2015   Procedure: A CELL PLACMENT;  Surgeon: Loel Lofty Dillingham, DO;  Location: [facility];  Service: Plastics;  Laterality: N/A;   CARDIOVASCULAR STRESS TEST  12-30-2004   normal perfusion study/  no ischemia or infartion/  normal LV wall function and wall motion , ef 66%   CATARACT EXTRACTION W/ INTRAOCULAR LENS  IMPLANT, BILATERAL  1995   COLONOSCOPY  2008   w/Dr.Brodie    COLONOSCOPY W/ POLYPECTOMY  last one 2008   COMPRESSION HIP SCREW Right 05/18/2014   Procedure: COMPRESSION HIP;  Surgeon: Carole Civil, MD;  Location: [facility];  Service: Orthopedics;  Laterality: Right;   ESOPHAGOGASTRODUODENOSCOPY  last one 01-09-2011   EVALUATION UNDER ANESTHESIA WITH FISTULECTOMY N/A 01/06/2015   Procedure: EXAM UNDER ANESTHESIA , placement of seton;  Surgeon: Jackolyn Confer, MD;  Location: [facility];  Service: General;  Laterality: N/A;   I & D EXTREMITY N/A 04/07/2015   Procedure: IRRIGATION AND DEBRIDEMENT ISCHIAL ULCER;  Surgeon: Loel Lofty Dillingham, DO;  Location: [facility];  Service: Plastics;  Laterality: N/A;   INCISION AND DRAINAGE OF WOUND N/A 09/30/2014   Procedure: IRRIGATION AND DEBRIDEMENT SACRAL WOUND, EXCISION OF PERIRECTAL TRACT WITH PLACEMENT OF ACCELL;  Surgeon: Theodoro Kos, DO;  Location: [facility];  Service: Plastics;  Laterality: N/A;   LIGATION OF INTERNAL FISTULA TRACT N/A 10/22/2016   Procedure: LIGATION OF INTERNAL FISTULA TRACT;  Surgeon: Leighton Ruff, MD;  Location: [facility];  Service: General;  Laterality: N/A;   ORIF FEMUR FRACTURE Left 10/09/2012   Procedure: OPEN REDUCTION INTERNAL FIXATION (ORIF) DISTAL FEMUR FRACTURE;  Surgeon: Rozanna Box, MD;  Location: [facility];  Service: Orthopedics;  Laterality: Left;    RETINOPATHY SURGERY Bilateral 1980's?   TRANSTHORACIC ECHOCARDIOGRAM  02-18-2011   mild LVH,  ef 55-60%,  grade I diastolic dysfunction/  mild TR/  RV systolic pressure increased consistant with moderate pulmonary hypertension   Social History   Socioeconomic History   Marital status: Married    Spouse name: [name]   Number of children: 0   Years of education: 12   Highest education level: Not on file  Occupational History   Occupation: Retired    Fish farm manager: RETIRED  Tobacco Use   Smoking status: Never   Smokeless tobacco: Never  Vaping Use   Vaping Use: Never used  Substance and Sexual Activity   Alcohol use: No   Drug use: No   Sexual activity: Not Currently  Other Topics Concern   Not on file  Social History Narrative   Lives with husband.    Caffeine use: none   Social Determinants of Radio broadcast assistant Strain: Low Risk    Difficulty of Paying Living Expenses: Not hard at all  Food Insecurity: No Food Insecurity   Worried About Charity fundraiser in the Last Year: Never true   Arboriculturist in the Last Year: Never true  Transportation Needs: No Transportation Needs   Lack of Transportation (Medical): No   Lack of Transportation (Non-Medical): No  Physical Activity: Inactive   Days of Exercise per Week: 0 days   Minutes of Exercise per Session: 0 min  Stress: Stress Concern Present   Feeling of Stress : To some extent  Social Connections: Moderately Isolated   Frequency of Communication with Friends and Family: More than three times a week   Frequency of Social Gatherings with Friends and Family: More than three times a week   Attends Religious Services: Never   Marine scientist or Organizations: No   Attends Archivist Meetings: Never   Marital Status: Married   Outpatient Encounter Medications as of 04/21/2021  Medication Sig   amLODipine (NORVASC) 5 MG tablet Take 1 tablet (5 mg total) by mouth daily.   atorvastatin (LIPITOR) 10  MG tablet Take 1 tablet (10 mg total) by mouth daily.   carvedilol (COREG) 12.5 MG tablet Take 1 tablet (12.5 mg total) by mouth 2 (two) times daily with a meal.   Cholecalciferol 25 MCG (1000 UT) capsule Take 1 capsule (1,000 Units total) by mouth daily.   colchicine 0.6 MG tablet Take 0.6 mg by mouth daily as needed.   Febuxostat 80 MG TABS Take by mouth.   fluticasone (FLONASE) 50 MCG/ACT nasal spray Place 2 sprays into both nostrils daily. Use 2 sprays in each nostril daily   furosemide (LASIX) 40 MG tablet TAKE 1 TABLET TWICE WEEKLY   Insulin Glargine (BASAGLAR KWIKPEN) 100 UNIT/ML  Inject 8 Units into the skin daily.   ketorolac (ACULAR) 0.5 % ophthalmic solution Place 1 drop into the right eye every morning.   Lancets Misc. MISC Use as directed to check blood sugar up to 4x daily. E11.65 (Patient not taking: Reported on 04/03/2021)   levothyroxine (SYNTHROID) 75 MCG tablet Take 1 tablet (75 mcg total) by mouth daily before breakfast.   pantoprazole (PROTONIX) 20 MG tablet Take 1 tablet (20 mg total) by mouth daily.   prednisoLONE acetate (PRED FORTE) 1 % ophthalmic suspension Place 1 drop into the right eye 4 (four) times daily.   ULTICARE MICRO PEN NEEDLES 32G X 4 MM MISC TWICE DAILY (Patient not taking: Reported on 04/03/2021)   [DISCONTINUED] ACCU-CHEK AVIVA PLUS test strip CHECK BLOOD SUGAR UP TO 4 TIMES A DAY (Patient not taking: Reported on 04/03/2021)   [DISCONTINUED] Accu-Chek Softclix Lancets lancets CHECK BLOOD SUGAR 2 TIMES A DAY (Patient not taking: Reported on 04/03/2021)   [DISCONTINUED] blood glucose meter kit and supplies Dispense based on patient and insurance preference. Use up to four times daily as directed. (FOR ICD-10 E10.9, E11.9). (Patient not taking: Reported on 04/03/2021)   [DISCONTINUED] levothyroxine (SYNTHROID) 50 MCG tablet Take 1 tablet (50 mcg total) by mouth daily before breakfast.   No facility-administered encounter medications on file as of 04/21/2021.    ALLERGIES: Allergies  Allergen Reactions   Aspirin Nausea And Vomiting   Ciprofloxacin Other (See Comments)    Upset Stomach   Codeine Nausea And Vomiting    Makes me sick    Losartan     Hyperkalemia    Micardis [Telmisartan] Other (See Comments)    unknown   Nexium [Esomeprazole Magnesium] Other (See Comments)    Causes internal bleeding   Niaspan [Niacin Er] Other (See Comments)    Reaction is unknown   Onglyza [Saxagliptin] Other (See Comments)    Reaction is unknown   Other     No otc pain medications   Rofecoxib Other (See Comments)    unknown   Simvastatin    Tequin [Gatifloxacin] Other (See Comments)    Reaction is unknown   Welchol [Colesevelam Hcl]    Amoxicillin Nausea And Vomiting and Rash    Has patient had a PCN reaction causing immediate rash, facial/tongue/throat swelling, SOB or lightheadedness with hypotension: Yes Has patient had a PCN reaction causing severe rash involving mucus membranes or skin necrosis: no  Has patient had a PCN reaction that required hospitalization No Has patient had a PCN reaction occurring within the last 10 years: No If all of the above answers are "NO", then may proceed with Cephalosporin use.    VACCINATION STATUS: Immunization History  Administered Date(s) Administered   Fluad Quad(high Dose 65+) 02/05/2019, 03/05/2020, 03/10/2021   Influenza, High Dose Seasonal PF 03/05/2016, 03/14/2017, 03/01/2018   Influenza,inj,Quad PF,6+ Mos 02/16/2013, 02/27/2015   Moderna Covid-19 Vaccine Bivalent Booster 7yr & up 03/10/2021   Moderna Sars-Covid-2 Vaccination 10/14/2020   PFIZER(Purple Top)SARS-COV-2 Vaccination 06/13/2019, 07/04/2019, 03/05/2020   Pneumococcal Conjugate-13 10/11/2014   Pneumococcal Polysaccharide-23 10/10/2012   Tdap 10/06/2012    Diabetes She presents for her follow-up diabetic visit. She has type 2 diabetes mellitus. Onset time: She was diagnosed at approximate age of 89years . Her disease course has  been stable. There are no hypoglycemic associated symptoms. Pertinent negatives for hypoglycemia include no confusion, headaches, pallor or seizures. Pertinent negatives for diabetes include no chest pain, no fatigue, no polydipsia, no polyphagia and no visual change.  There are no hypoglycemic complications. Symptoms are stable. Diabetic complications include nephropathy, peripheral neuropathy, PVD and retinopathy. Risk factors for coronary artery disease include diabetes mellitus, dyslipidemia, hypertension, sedentary lifestyle and post-menopausal. Current diabetic treatment includes oral agent (monotherapy). She is compliant with treatment most of the time. Her weight is fluctuating minimally. She is following a generally unhealthy diet. When asked about meal planning, she reported none. She has not had a previous visit with a dietitian. She never participates in exercise. Her home blood glucose trend is fluctuating minimally. Her breakfast blood glucose range is generally 130-140 mg/dl. Her bedtime blood glucose range is generally 140-180 mg/dl. Her overall blood glucose range is 140-180 mg/dl. (She received the freestyle libre device.  She presents with steady glycemic profile with A1c of 7.5% previsit.  This is an overall improvement for her.  She did not document any hypoglycemia.   ) An ACE inhibitor/angiotensin II receptor blocker is being taken. Eye exam is current.  Thyroid Problem Presents for initial visit. Onset time: 15 years. Patient reports no cold intolerance, diarrhea, fatigue, heat intolerance, palpitations or visual change. The symptoms have been stable. Past treatments include levothyroxine. The following procedures have not been performed: thyroidectomy.  Hypertension This is a chronic problem. The current episode started more than 1 year ago. Pertinent negatives include no chest pain, headaches, palpitations or shortness of breath. Past treatments include angiotensin blockers.  Hypertensive end-organ damage includes PVD and retinopathy. Identifiable causes of hypertension include a thyroid problem.   Review of systems  Constitutional: + Minimally fluctuating body weight,  current  Body mass index is 27.33 kg/m. , no fatigue, no subjective hyperthermia, no subjective hypothermia    Objective:    BP 140/66   Pulse 64   Ht '5\' 2"'  (1.575 m)   Wt 149 lb 6.4 oz (67.8 kg)   BMI 27.33 kg/m   Wt Readings from Last 3 Encounters:  04/21/21 149 lb 6.4 oz (67.8 kg)  04/03/21 150 lb (68 kg)  04/01/21 150 lb (68 kg)     Physical Exam- Limited  Constitutional:  Body mass index is 27.33 kg/m. , not in acute distress, normal state of mind    Results for orders placed or performed in visit on 04/01/21  Bayer DCA Hb A1c Waived  Result Value Ref Range   HB A1C (BAYER DCA - WAIVED) 7.5 (H) 4.8 - 5.6 %  Renal Function Panel  Result Value Ref Range   Glucose 140 (H) 70 - 99 mg/dL   BUN 28 (H) 8 - 27 mg/dL   Creatinine, Ser 1.68 (H) 0.57 - 1.00 mg/dL   eGFR 32 (L) >59 mL/min/1.73   BUN/Creatinine Ratio 17 12 - 28   Sodium 136 134 - 144 mmol/L   Potassium 5.2 3.5 - 5.2 mmol/L   Chloride 102 96 - 106 mmol/L   CO2 22 20 - 29 mmol/L   Calcium 9.2 8.7 - 10.3 mg/dL   Phosphorus 3.4 3.0 - 4.3 mg/dL   Albumin 4.2 3.7 - 4.7 g/dL  TSH  Result Value Ref Range   TSH 5.530 (H) 0.450 - 4.500 uIU/mL  T4, Free  Result Value Ref Range   Free T4 1.23 0.82 - 1.77 ng/dL   Diabetic Labs (most recent): Lab Results  Component Value Date   HGBA1C 7.5 (H) 04/01/2021   HGBA1C 7.4 (H) 10/01/2020   HGBA1C 7.5 (H) 05/28/2020   Lipid Panel     Component Value Date/Time   CHOL 112 10/01/2020 0812  CHOL 105 09/11/2012 1057   TRIG 68 10/01/2020 0812   TRIG 170 (H) 10/11/2014 1159   TRIG 79 09/11/2012 1057   HDL 40 10/01/2020 0812   HDL 53 10/11/2014 1159   HDL 41 09/11/2012 1057   CHOLHDL 2.8 10/01/2020 0812   LDLCALC 58 10/01/2020 0812   LDLCALC 50 02/14/2014 1012    LDLCALC 48 09/11/2012 1057     Assessment & Plan:   1. Type 2 diabetes mellitus with stage 4 chronic kidney disease, without long-term current use of insulin ([place])  - Patient has currently controlled asymptomatic type 2 DM since  75 years of age. She received the freestyle libre device.  She presents with steady glycemic profile with A1c of 7.5% previsit.  This is an overall improvement for her.  She did not document any hypoglycemia.      -her  diabetes is complicated by PAD, CKD, neuropathy and patient remains at a high risk for more acute and chronic complications of diabetes which include CAD, CVA, CKD, retinopathy, and neuropathy. These are all discussed in detail with the patient.  - I have counseled the patient on diet management  by adopting a carbohydrate restricted/protein rich diet.  - she acknowledges that there is a room for improvement in her food and drink choices. - Suggestion is made for her to avoid simple carbohydrates  from her diet including Cakes, Sweet Desserts, Ice Cream, Soda (diet and regular), Sweet Tea, Candies, Chips, Cookies, Store Bought Juices, Alcohol in Excess of  1-2 drinks a day, Artificial Sweeteners,  Coffee Creamer, and "Sugar-free" Products, Lemonade. This will help patient to have more stable blood glucose profile and potentially avoid unintended weight gain.   - I encouraged the patient to switch to  unprocessed or minimally processed complex starch and increased protein intake (animal or plant source), fruits, and vegetables.  - Patient is advised to stick to a routine mealtimes to eat 3 meals  a day and avoid unnecessary snacks ( to snack only to correct hypoglycemia).   - I have approached patient with the following individualized plan to manage diabetes and patient agrees:    -Her husband, Ludwig Clarks , continues to offer to help. -She is advised to continue Basaglar 82 units every morning with breakfast  , advised to use her CGM to monitor blood  glucose at all times.   - First priority in this patient will be to avoid hypoglycemia.   -Patient is encouraged to call clinic for blood glucose levels less than 70 or above 200 mg /dl.  -Patient is not a candidate for MTF, Incretin therapy.  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    - Patient specific target  A1c;  LDL, HDL, Triglycerides,  were discussed in detail.  2) BP/HTN:  Her blood pressure is not controlled to target.   She is advised to continue her current blood pressure medications including amlodipine 5 mg p.o. daily, carvedilol 12.5 mg p.o. twice daily.  She  has documented allergy to ARB.  3) Lipids/HPL: Her recent lipid panel continues to show improvement in her LDL at 58.she will continue to benefit from statin intervention.  She is advised to continue atorvastatin 10 mg p.o. nightly.  Side effects and precautions discussed with her.     4)  Weight/Diet: Her BMI is 27.3.  She is not a candidate for major weight loss.     CDE Consult has been initiated, she has limited ability to exercise.  5)  Hypothyroidism:  -  Her current thyroid function tests are consistent with under replacement.  I discussed and increase her levothyroxine to 75 mg p.o. daily before breakfast.     - We discussed about the correct intake of her thyroid hormone, on empty stomach at fasting, with water, separated by at least 30 minutes from breakfast and other medications,  and separated by more than 4 hours from calcium, iron, multivitamins, acid reflux medications (PPIs). -Patient is made aware of the fact that thyroid hormone replacement is needed for life, dose to be adjusted by periodic monitoring of thyroid function tests.    5) Chronic Care/Health Maintenance:  -Patient is  on ACEI/ARB and Statin medications and encouraged to continue to follow up with Ophthalmology, Podiatrist at least yearly or according to recommendations, and advised to   stay away from smoking. I have recommended  yearly flu vaccine and pneumonia vaccination at least every 5 years;  and  sleep for at least 7 hours a day.  - I advised patient to maintain close follow up with [name], DO for primary care needs.    I spent 41 minutes in the care of the patient today including review of labs from [place], Lipids, Thyroid Function, Hematology (current and previous including abstractions from other facilities); face-to-face time discussing  her blood glucose readings/logs, discussing hypoglycemia and hyperglycemia episodes and symptoms, medications doses, her options of short and long term treatment based on the latest standards of care / guidelines;  discussion about incorporating lifestyle medicine;  and documenting the encounter.    Please refer to Patient Instructions for Blood Glucose Monitoring and Insulin/Medications Dosing Guide"  in media tab for additional information. Please  also refer to " Patient Self Inventory" in the Media  tab for reviewed elements of pertinent patient history.  [name] participated in the discussions, expressed understanding, and voiced agreement with the above plans.  All questions were answered to her satisfaction. she is encouraged to contact clinic should she have any questions or concerns prior to her return visit.   Follow up plan: - Return in about 6 months (around 10/19/2021) for F/U with Pre-visit Labs, Meter, Logs, A1c here.Glade Lloyd, MD Phone: [phone]  Fax: [phone]  -  This note was partially dictated with voice recognition software. Similar sounding words can be transcribed inadequately or may not  be corrected upon review.  04/21/2021, 12:36 PM

## 2021-04-24 ENCOUNTER — Ambulatory Visit (INDEPENDENT_AMBULATORY_CARE_PROVIDER_SITE_OTHER): Payer: Medicare Other | Admitting: Licensed Clinical Social Worker

## 2021-04-24 DIAGNOSIS — N189 Chronic kidney disease, unspecified: Secondary | ICD-10-CM

## 2021-04-24 DIAGNOSIS — M810 Age-related osteoporosis without current pathological fracture: Secondary | ICD-10-CM

## 2021-04-24 DIAGNOSIS — E1159 Type 2 diabetes mellitus with other circulatory complications: Secondary | ICD-10-CM

## 2021-04-24 DIAGNOSIS — I152 Hypertension secondary to endocrine disorders: Secondary | ICD-10-CM

## 2021-04-24 DIAGNOSIS — F32A Depression, unspecified: Secondary | ICD-10-CM

## 2021-04-24 DIAGNOSIS — E1169 Type 2 diabetes mellitus with other specified complication: Secondary | ICD-10-CM

## 2021-04-24 DIAGNOSIS — F411 Generalized anxiety disorder: Secondary | ICD-10-CM

## 2021-04-24 DIAGNOSIS — Z794 Long term (current) use of insulin: Secondary | ICD-10-CM

## 2021-04-24 DIAGNOSIS — H353134 Nonexudative age-related macular degeneration, bilateral, advanced atrophic with subfoveal involvement: Secondary | ICD-10-CM

## 2021-04-24 DIAGNOSIS — N183 Chronic kidney disease, stage 3 unspecified: Secondary | ICD-10-CM

## 2021-04-24 DIAGNOSIS — E034 Atrophy of thyroid (acquired): Secondary | ICD-10-CM

## 2021-04-24 DIAGNOSIS — D631 Anemia in chronic kidney disease: Secondary | ICD-10-CM

## 2021-04-24 DIAGNOSIS — E1122 Type 2 diabetes mellitus with diabetic chronic kidney disease: Secondary | ICD-10-CM

## 2021-04-24 NOTE — Chronic Care Management (AMB) (Signed)
Chronic Care Management    Clinical Social Work Note  04/24/2021 Name: [name] MRN: [id number] DOB: [birth year]  [name] is a 75 y.o. year old female who is a primary care patient of [name], DO. The CCM team was consulted to assist the patient with chronic disease management and/or care coordination needs related to: [name] .   Engaged with patient by telephone for follow up visit in response to provider referral for social work chronic care management and care coordination services.   Consent to Services:  The patient was given information about Chronic Care Management services, agreed to services, and gave verbal consent prior to initiation of services.  Please see initial visit note for detailed documentation.   Patient agreed to services and consent obtained.   Assessment: Review of patient past medical history, allergies, medications, and health status, including review of relevant consultants reports was performed today as part of a comprehensive evaluation and provision of chronic care management and care coordination services.     SDOH (Social Determinants of Health) assessments and interventions performed:  SDOH Interventions    Flowsheet Row Most Recent Value  SDOH Interventions   Physical Activity Interventions Other (Comments)  [walking challenges. risk for falls]  Stress Interventions Provide Counseling  [client has stress related to managing medical needs]  Depression Interventions/Treatment  --  [informed client of LCSW support and of RNCM support]        Advanced Directives Status: See [employer] application for related entries.  CCM Care Plan  Allergies  Allergen Reactions   Aspirin Nausea And Vomiting   Ciprofloxacin Other (See Comments)    Upset Stomach   Codeine Nausea And Vomiting    Makes me sick    Losartan     Hyperkalemia    Micardis [Telmisartan] Other (See Comments)    unknown   Nexium [Esomeprazole Magnesium]  Other (See Comments)    Causes internal bleeding   Niaspan [Niacin Er] Other (See Comments)    Reaction is unknown   Onglyza [Saxagliptin] Other (See Comments)    Reaction is unknown   Other     No otc pain medications   Rofecoxib Other (See Comments)    unknown   Simvastatin    Tequin [Gatifloxacin] Other (See Comments)    Reaction is unknown   Welchol [Colesevelam Hcl]    Amoxicillin Nausea And Vomiting and Rash    Has patient had a PCN reaction causing immediate rash, facial/tongue/throat swelling, SOB or lightheadedness with hypotension: Yes Has patient had a PCN reaction causing severe rash involving mucus membranes or skin necrosis: no  Has patient had a PCN reaction that required hospitalization No Has patient had a PCN reaction occurring within the last 10 years: No If all of the above answers are "NO", then may proceed with Cephalosporin use.     Outpatient Encounter Medications as of 04/24/2021  Medication Sig   amLODipine (NORVASC) 5 MG tablet Take 1 tablet (5 mg total) by mouth daily.   atorvastatin (LIPITOR) 10 MG tablet Take 1 tablet (10 mg total) by mouth daily.   carvedilol (COREG) 12.5 MG tablet Take 1 tablet (12.5 mg total) by mouth 2 (two) times daily with a meal.   Cholecalciferol 25 MCG (1000 UT) capsule Take 1 capsule (1,000 Units total) by mouth daily.   colchicine 0.6 MG tablet Take 0.6 mg by mouth daily as needed.   Febuxostat 80 MG TABS Take by mouth.   fluticasone (FLONASE) 50 MCG/ACT  nasal spray Place 2 sprays into both nostrils daily. Use 2 sprays in each nostril daily   furosemide (LASIX) 40 MG tablet TAKE 1 TABLET TWICE WEEKLY   Insulin Glargine (BASAGLAR KWIKPEN) 100 UNIT/ML Inject 8 Units into the skin daily.   Lancets Misc. MISC Use as directed to check blood sugar up to 4x daily. E11.65 (Patient not taking: Reported on 04/03/2021)   levothyroxine (SYNTHROID) 75 MCG tablet Take 1 tablet (75 mcg total) by mouth daily before breakfast.    pantoprazole (PROTONIX) 20 MG tablet Take 1 tablet (20 mg total) by mouth daily.   prednisoLONE acetate (PRED FORTE) 1 % ophthalmic suspension Place 1 drop into the right eye 4 (four) times daily.   ULTICARE MICRO PEN NEEDLES 32G X 4 MM MISC TWICE DAILY (Patient not taking: Reported on 04/03/2021)   No facility-administered encounter medications on file as of 04/24/2021.    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 10/02/2019   Retinal hemorrhage of left eye 10/02/2019   Retinal microaneurysm of both eyes 10/02/2019   Dislocated intraocular lens, initial encounter 10/02/2019   Vitamin D deficiency 11/15/2017   Foot ulcer ([place]) 06/02/2016   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 05/18/2016   Cellulitis 05/05/2016   Diabetic foot infection ([place]) 05/05/2016   Nausea and vomiting 01/23/2015   CKD (chronic kidney disease), stage III (HCC)    Anemia in chronic renal disease    Gastroesophageal reflux disease without esophagitis 10/11/2014   Hypothyroidism 05/18/2014   Hip fracture requiring operative repair [facility])    Fall    Osteoporosis 09/06/2013   Type 2 diabetes mellitus with stage 3b chronic kidney disease, with long-term current use of insulin ([place]) 07/20/2013   Edema 03/03/2011   Mixed hyperlipidemia    Diabetic retinopathy ([place]) 10/28/2007   Hyperlipidemia associated with type 2 diabetes mellitus ([place]) 10/28/2007   Anxiety state 10/28/2007   Depression 10/28/2007   Essential  hypertension 10/28/2007   Disorder resulting from impaired renal function 10/28/2007   COLONIC POLYPS, HYPERPLASTIC 11/17/2006   DIVERTICULOSIS, COLON 11/17/2006    Conditions to be addressed/monitored: monitor client completion of ADLs  Care Plan : [name]  Updates made by Katha Cabal, LCSW since 04/24/2021 12:00 AM     Problem: Coping Skills (General Plan of Care)      Goal: Manage medical needs and complete ADLs daily   Start Date: 04/24/2021  Expected End Date: 07/20/2021  This Visit's Progress: On track  Recent Progress: On track  Priority: Medium  Note:   Current barriers:   Patient in need of assistance with connecting to community resources for assisting with vision needs and helping client complete ADLs Patient is unable to independently navigate community resource options without care coordination support Legally Blind Mobility issues  Clinical Goals:  LCSW to communicate with client in next 30 days to discuss client management of medical needs and to discuss client completion of daily ADLs Client to attend scheduled medical appointments in next 30 days Client to communicate with RNCM or LCSW as needed  in next 30 days for CCM support  Clinical Interventions:  Collaboration with [name], DO regarding development and update of comprehensive plan of care as evidenced by provider attestation and co-signature Discussed current needs with client today Discussed with client her use of CGM monitor. Client is very pleased with using her CGM and is feeling that use of this monitor gives her more medical independence. Discussed with [name] her appointment with Dr. Dorris Fetch, endocrinologist.  She was pleased with her appointment with Dr. Dorris Fetch. Reviewed client socialization with others. Reagann said she has several friends and that she talks with these friends via phone. Encouraged [name] to call [facility] as needed for CCM nursing support.  Reviewed client completion  of ADLs.  Patient Activities  Listens to music Talks with family and friends via phone Communicates regularly with her spouse, [name]  Patient Coping Strengths: Takes medications as prescribed Attends scheduled medical appointments Has transport support from spouse, [name]  Patient Deficits:   Legally blind Mobility issues  Patient Goals:  Client to communicate with RNCM or LCSW as needed in next 30 days for CCM support Client to manage health needs faced and complete ADLs daily for next 30 days Communicate daily with spouse to discuss client needs -  Follow Up Plan:  LCSW  to call client or [name], spouse of client, on 06/23/21 at 12:30 PM to assess client needs    [name].Taye Cato MSW, LCSW Licensed Clinical Social Worker [facility] Care Management [phone]

## 2021-04-24 NOTE — Patient Instructions (Addendum)
Visit Information  Patient Goals: Protect My Health (Patient). Manage Health needs and complete ADLs daily  Timeframe:  Short-Term Goal Priority:  Medium Progress: On Track Start Date:    04/24/21                      Expected End Date:  07/20/21                    Follow Up Date  06/23/21 at 12:30 PM   Protect My Health (Patient)   Manage Health needs and complete ADLs daily    Why is this important?   Screening tests can find diseases early when they are easier to treat.  Your doctor or nurse will talk with you about which tests are important for you.  Getting shots for common diseases like the flu and shingles will help prevent them.    Patient Activities  Listens to music Talks with family and friends via phone Communicates regularly with her spouse, [name]  Patient Coping Strengths: Takes medications as prescribed Attends scheduled medical appointments Has transport support from spouse, [name]  Patient Deficits:   Legally blind Mobility issues  Patient Goals:  Client to communicate with RNCM or LCSW as needed in next 30 days for CCM support Client to manage health needs faced and complete ADLs daily for next 30 days Communicate daily with spouse to discuss client needs -  Follow Up Plan:  LCSW  to call client or [name], spouse of client,  on 06/23/21 at 12:30 PM to assess client needs  [name].Marchello Rothgeb MSW, LCSW Licensed Clinical Social Worker [facility] Care Management [phone]

## 2021-04-29 ENCOUNTER — Telehealth: Payer: Self-pay

## 2021-04-29 NOTE — Telephone Encounter (Signed)
NA/NVM, Almyra Free does these & it is in process

## 2021-04-29 NOTE — Telephone Encounter (Signed)
Patient states Almyra Free has had ppw for two weeks and has not called her or anything. Paperwork needs to be done by 12-31 or her insulin will be stopped.

## 2021-04-29 NOTE — Telephone Encounter (Signed)
Patient brought paperwork to be filled out here last week for assistance with her insulin.  She is wondering if this has been filled out and sent back yet.  Must be done before 05/23/21.

## 2021-04-29 NOTE — Telephone Encounter (Signed)
Patient is calling back to speak to with Almyra Free

## 2021-04-30 NOTE — Telephone Encounter (Signed)
Pt aware papers have been faxed and we have samples if she needs them

## 2021-05-06 DIAGNOSIS — N183 Chronic kidney disease, stage 3 unspecified: Secondary | ICD-10-CM | POA: Diagnosis not present

## 2021-05-06 DIAGNOSIS — N2581 Secondary hyperparathyroidism of renal origin: Secondary | ICD-10-CM | POA: Diagnosis not present

## 2021-05-06 DIAGNOSIS — I129 Hypertensive chronic kidney disease with stage 1 through stage 4 chronic kidney disease, or unspecified chronic kidney disease: Secondary | ICD-10-CM | POA: Diagnosis not present

## 2021-05-06 DIAGNOSIS — M109 Gout, unspecified: Secondary | ICD-10-CM | POA: Diagnosis not present

## 2021-05-06 DIAGNOSIS — R809 Proteinuria, unspecified: Secondary | ICD-10-CM | POA: Diagnosis not present

## 2021-05-06 DIAGNOSIS — D631 Anemia in chronic kidney disease: Secondary | ICD-10-CM | POA: Diagnosis not present

## 2021-05-06 DIAGNOSIS — E1122 Type 2 diabetes mellitus with diabetic chronic kidney disease: Secondary | ICD-10-CM | POA: Diagnosis not present

## 2021-06-02 DIAGNOSIS — L84 Corns and callosities: Secondary | ICD-10-CM | POA: Diagnosis not present

## 2021-06-02 DIAGNOSIS — E1151 Type 2 diabetes mellitus with diabetic peripheral angiopathy without gangrene: Secondary | ICD-10-CM | POA: Diagnosis not present

## 2021-06-02 DIAGNOSIS — B351 Tinea unguium: Secondary | ICD-10-CM | POA: Diagnosis not present

## 2021-06-02 DIAGNOSIS — M79676 Pain in unspecified toe(s): Secondary | ICD-10-CM | POA: Diagnosis not present

## 2021-06-15 ENCOUNTER — Telehealth: Payer: Self-pay

## 2021-06-15 NOTE — Telephone Encounter (Signed)
RETURNED PT PHONE CALL.  Pt has [employer] application confusion.  Pt also rec'd 1 box of basaglar on 06/09/21.

## 2021-06-23 ENCOUNTER — Telehealth: Payer: Medicare Other

## 2021-06-29 ENCOUNTER — Other Ambulatory Visit: Payer: Self-pay | Admitting: Family Medicine

## 2021-06-29 DIAGNOSIS — E1159 Type 2 diabetes mellitus with other circulatory complications: Secondary | ICD-10-CM

## 2021-06-29 DIAGNOSIS — E785 Hyperlipidemia, unspecified: Secondary | ICD-10-CM

## 2021-06-29 DIAGNOSIS — E1169 Type 2 diabetes mellitus with other specified complication: Secondary | ICD-10-CM

## 2021-06-29 NOTE — Telephone Encounter (Signed)
[employer]. NTBS in May for 6 mos ckup. Mail order sent

## 2021-06-29 NOTE — Telephone Encounter (Signed)
Appointment made on 5-9 with Dr. Lajuana Ripple

## 2021-07-29 ENCOUNTER — Ambulatory Visit (INDEPENDENT_AMBULATORY_CARE_PROVIDER_SITE_OTHER): Payer: Medicare Other | Admitting: Family Medicine

## 2021-07-29 ENCOUNTER — Encounter: Payer: Self-pay | Admitting: Family Medicine

## 2021-07-29 VITALS — Temp 97.4°F | Ht 62.0 in

## 2021-07-29 DIAGNOSIS — H8112 Benign paroxysmal vertigo, left ear: Secondary | ICD-10-CM | POA: Diagnosis not present

## 2021-07-29 MED ORDER — MECLIZINE HCL 25 MG PO TABS: 12.5000 mg | ORAL_TABLET | Freq: Three times a day (TID) | ORAL | 0 refills | Status: DC | PRN

## 2021-07-29 MED ORDER — LEVOCETIRIZINE DIHYDROCHLORIDE 5 MG PO TABS: 5.0000 mg | ORAL_TABLET | Freq: Every evening | ORAL | 0 refills | Status: DC | PRN

## 2021-07-29 NOTE — Patient Instructions (Signed)
Benign Positional Vertigo ?Vertigo is the feeling that you or your surroundings are moving when they are not. Benign positional vertigo is the most common form of vertigo. This is usually a harmless condition (benign). This condition is positional. This means that symptoms are triggered by certain movements and positions. ?This condition can be dangerous if it occurs while you are doing something that could cause harm to yourself or others. This includes activities such as driving or operating machinery. ?What are the causes? ?The inner ear has fluid-filled canals that help your brain sense movement and balance. When the fluid moves, the brain receives messages about your body's position. ?With benign positional vertigo, calcium crystals in the inner ear break free and disturb the inner ear area. This causes your brain to receive confusing messages about your body's position. ?What increases the risk? ?You are more likely to develop this condition if: ?You are a woman. ?You are 48 years of age or older. ?You have recently had a head injury. ?You have an inner ear disease. ?What are the signs or symptoms? ?Symptoms of this condition usually happen when you move your head or your eyes in different directions. Symptoms may start suddenly and usually last for less than a minute. They include: ?Loss of balance and falling. ?Feeling like you are spinning or moving. ?Feeling like your surroundings are spinning or moving. ?Nausea and vomiting. ?Blurred vision. ?Dizziness. ?Involuntary eye movement (nystagmus). ?Symptoms can be mild and cause only minor problems, or they can be severe and interfere with daily life. Episodes of benign positional vertigo may return (recur) over time. Symptoms may also improve over time. ?How is this diagnosed? ?This condition may be diagnosed based on: ?Your medical history. ?A physical exam of the head, neck, and ears. ?Positional tests to check for or stimulate vertigo. You may be asked to  turn your head and change positions, such as going from sitting to lying down. A health care provider will watch for symptoms of vertigo. ?You may be referred to a health care provider who specializes in ear, nose, and throat problems (ENT or otolaryngologist) or a provider who specializes in disorders of the nervous system (neurologist). ?How is this treated? ?This condition may be treated in a session in which your health care provider moves your head in specific positions to help the displaced crystals in your inner ear move. Treatment for this condition may take several sessions. Surgery may be needed in severe cases, but this is rare. ?In some cases, benign positional vertigo may resolve on its own in 2-4 weeks. ?Follow these instructions at home: ?Safety ?Move slowly. Avoid sudden body or head movements or certain positions, as told by your health care provider. ?Avoid driving or operating machinery until your health care provider says it is safe. ?Avoid doing any tasks that would be dangerous to you or others if vertigo occurs. ?If you have trouble walking or keeping your balance, try using a cane for stability. If you feel dizzy or unstable, sit down right away. ?Return to your normal activities as told by your health care provider. Ask your health care provider what activities are safe for you. ?General instructions ?Take over-the-counter and prescription medicines only as told by your health care provider. ?Drink enough fluid to keep your urine pale yellow. ?Keep all follow-up visits. This is important. ?Contact a health care provider if: ?You have a fever. ?Your condition gets worse or you develop new symptoms. ?Your family or friends notice any behavioral changes. ?You  have nausea or vomiting that gets worse. ?You have numbness or a prickling and tingling sensation. ?Get help right away if you: ?Have difficulty speaking or moving. ?Are always dizzy or faint. ?Develop severe headaches. ?Have weakness in  your legs or arms. ?Have changes in your hearing or vision. ?Develop a stiff neck. ?Develop sensitivity to light. ?These symptoms may represent a serious problem that is an emergency. Do not wait to see if the symptoms will go away. Get medical help right away. Call your local emergency services (911 in the [place] Do not drive yourself to the hospital. ?Summary ?Vertigo is the feeling that you or your surroundings are moving when they are not. Benign positional vertigo is the most common form of vertigo. ?This condition is caused by calcium crystals in the inner ear that become displaced. This causes a disturbance in an area of the inner ear that helps your brain sense movement and balance. ?Symptoms include loss of balance and falling, feeling that you or your surroundings are moving, nausea and vomiting, and blurred vision. ?This condition can be diagnosed based on symptoms, a physical exam, and positional tests. ?Follow safety instructions as told by your health care provider and keep all follow-up visits. This is important. ?This information is not intended to replace advice given to you by your health care provider. Make sure you discuss any questions you have with your health care provider. ?Document Revised: 04/09/2020 Document Reviewed: 04/09/2020 ?Elsevier Patient Education ? [place]. ? ?

## 2021-07-29 NOTE — Progress Notes (Signed)
Subjective: CC: Dizziness PCP: [name] Norlander, DO IDP:OEUMP [name] is a 76 y.o. female presenting to clinic today for:  1.  Dizziness Patient is coming today's visit by her spouse.  She reports that she has been having some intermittent episodes of dizziness which she describes as room spinning over the last couple of days.  She has not necessarily had any significant URI symptoms to explain but she notes that when she turns her head to the left and looks at her husband when he is sitting in his chair at home she starts having some spinning sensation.  She has not had any falls.  She does not think that she is having any fluctuation in her blood pressures.  She has not taken anything over-the-counter for this   ROS: Per HPI  Allergies  Allergen Reactions   Aspirin Nausea And Vomiting   Ciprofloxacin Other (See Comments)    Upset Stomach   Codeine Nausea And Vomiting    Makes me sick    Losartan     Hyperkalemia    Micardis [Telmisartan] Other (See Comments)    unknown   Nexium [Esomeprazole Magnesium] Other (See Comments)    Causes internal bleeding   Niaspan [Niacin Er] Other (See Comments)    Reaction is unknown   Onglyza [Saxagliptin] Other (See Comments)    Reaction is unknown   Other     No otc pain medications   Rofecoxib Other (See Comments)    unknown   Simvastatin    Tequin [Gatifloxacin] Other (See Comments)    Reaction is unknown   Welchol [Colesevelam Hcl]    Amoxicillin Nausea And Vomiting and Rash    Has patient had a PCN reaction causing immediate rash, facial/tongue/throat swelling, SOB or lightheadedness with hypotension: Yes Has patient had a PCN reaction causing severe rash involving mucus membranes or skin necrosis: no  Has patient had a PCN reaction that required hospitalization No Has patient had a PCN reaction occurring within the last 10 years: No If all of the above answers are "NO", then may proceed with Cephalosporin use.    Past  Medical History:  Diagnosis Date   Anal fistula    Anemia in chronic renal disease    Aranesp injection --  when Hg <11, last injection 12-24-14.   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    CKD (chronic kidney disease), stage III [facility])    nephrologist--  dr Mercy Moore-- LOV  [phone]   Complication of anesthesia    post-op confusion    Constipation    Coronary artery disease    Diabetic gastroparesis ([place])    Diabetic retinopathy ([place])    bilateral --  monitored by dr [name]   Diverticulosis of colon    GAD (generalized anxiety disorder)    GERD (gastroesophageal reflux disease)    History of colon polyps    benign   History of esophagitis    History of GI bleed    upper 2009  due to esophagitis  &  2002  due to Mallory-Weiss tear   History of hyperkalemia    pt had previously been canceled twice dos  due to elevated K+, 07-29-2016 last date cancelled -- pt visited pcp same day treated w/ kayexelate and K+ came down, pt brought all her medication's in to pcp office and found pt was taking losartan that had been discontinued due to ckd, pcp stated this was cause of elevated K+   History of Mallory-Weiss syndrome    12/ 2002--  resolved   History of rectal abscess    12-29-2004  bedside I & D   Hyperlipidemia    Hypertension    Hypothyroidism    LAFB (left anterior fascicular block)    Right bundle branch block    Sacral decubitus ulcer    since 2014- 01-02-15 remains with wound" gauze dressing changes daily.   Type II diabetes mellitus (HCC)     Current Outpatient Medications:    amLODipine (NORVASC) 5 MG tablet, TAKE 1 TABLET EVERY DAY, Disp: 90 tablet, Rfl: 0    atorvastatin (LIPITOR) 10 MG tablet, TAKE 1 TABLET EVERY DAY, Disp: 90 tablet, Rfl: 0   carvedilol (COREG) 12.5 MG tablet, Take 1 tablet (12.5 mg total) by mouth 2 (two) times daily with a meal., Disp: 180 tablet, Rfl: 3   Cholecalciferol 25 MCG (1000 UT) capsule, Take 1 capsule (1,000 Units total) by mouth daily., Disp: 90 capsule, Rfl: 3   colchicine 0.6 MG tablet, Take 0.6 mg by mouth daily as needed., Disp: , Rfl:    Febuxostat 80 MG TABS, Take by mouth., Disp: , Rfl:    fluticasone (FLONASE) 50 MCG/ACT nasal spray, Place 2 sprays into both nostrils daily. Use 2 sprays in each nostril daily, Disp: 16 g, Rfl: 3   furosemide (LASIX) 40 MG tablet, TAKE 1 TABLET TWICE WEEKLY, Disp: 26 tablet, Rfl: 0   Insulin Glargine (BASAGLAR KWIKPEN) 100 UNIT/ML, Inject 8 Units into the skin daily., Disp: 9 mL, Rfl: 3   Lancets Misc. MISC, Use as directed to check blood sugar up to 4x daily. E11.65, Disp: 300 each, Rfl: 3   levothyroxine (SYNTHROID) 75 MCG tablet, Take 1 tablet (75 mcg total) by mouth daily before breakfast., Disp: 90 tablet, Rfl: 1   pantoprazole (PROTONIX) 20 MG tablet, Take 1 tablet (20 mg total) by mouth daily., Disp: 90 tablet, Rfl: 3   prednisoLONE acetate (PRED FORTE) 1 % ophthalmic suspension, Place 1 drop into the right eye 4 (four) times daily., Disp: 5 mL, Rfl: 0   ULTICARE MICRO PEN NEEDLES 32G X 4 MM MISC, TWICE DAILY, Disp: 100 each, Rfl: 5 Social History   Socioeconomic History   Marital status: Married    Spouse name: [name]   Number of children: 0   Years of education: 12   Highest education level: Not on file  Occupational History   Occupation: Retired    Fish farm manager: RETIRED  Tobacco Use   Smoking status: Never   Smokeless tobacco: Never  Vaping Use   Vaping Use: Never used  Substance and Sexual Activity   Alcohol use: No   Drug use: No   Sexual activity: Not Currently  Other Topics Concern   Not on file  Social History Narrative   Lives with husband.    Caffeine  use: none   Social Determinants of Radio broadcast assistant Strain: Low Risk    Difficulty of Paying Living Expenses: Not hard at all  Food Insecurity: No Food Insecurity   Worried About Charity fundraiser in the Last Year: Never true   [place] in the Last Year:  Never true  Transportation Needs: No Transportation Needs   Lack of Transportation (Medical): No   Lack of Transportation (Non-Medical): No  Physical Activity: Inactive   Days of Exercise per Week: 0 days   Minutes of Exercise per Session: 0 min  Stress: Stress Concern Present   Feeling of Stress : To some extent  Social Connections: Moderately Isolated   Frequency of Communication with Friends and Family: More than three times a week   Frequency of Social Gatherings with Friends and Family: More than three times a week   Attends Religious Services: Never   Marine scientist or Organizations: No   Attends Music therapist: Never   Marital Status: Married  Human resources officer Violence: Not At Risk   Fear of Current or Ex-Partner: No   Emotionally Abused: No   Physically Abused: No   Sexually Abused: No   Family History  Problem Relation Age of Onset   Colon cancer Father 77   Prostate cancer Father    Diabetes Father    Coronary artery disease Mother 62   Heart disease Mother    Diabetes Mother    Coronary artery disease Sister 84   Diabetes Sister    Heart disease Sister    Diabetes Sister    Diabetes Sister    Diabetes Sister    Diabetes Sister    Diabetes Sister    Diabetes Maternal Grandmother    Esophageal cancer Neg Hx    Stomach cancer Neg Hx    Rectal cancer Neg Hx     Objective: Office vital signs reviewed. BP 129/61 (Patient Position: Standing)    Pulse (!) 56    Temp (!) 97.4 F (36.3 C) (Temporal)    Ht '5\' 2"'$  (1.575 m)    SpO2 97%    BMI 27.33 kg/m   Physical Examination:  General: Awake, alert, well nourished, No acute distress HEENT: TMs intact  bilaterally. MSK: Ambulates with some assistance Neuro: No nystagmus.  Alert and oriented.  Follows commands  Orthostatic VS for the past 72 hrs (Last 3 readings):  Orthostatic BP Patient Position Orthostatic Pulse  07/29/21 0915 129/61 Standing 56  07/29/21 0911 142/62 Sitting 55   Assessment/ Plan: 76 y.o. female   BPPV (benign paroxysmal positional vertigo), left - Plan: meclizine (ANTIVERT) 25 MG tablet, levocetirizine (XYZAL) 5 MG tablet  Based on her symptomology I suspect BPPV.  I have given her handout on how to do Epley maneuver at home.  Going to place her on a little bit of antihistamine as well in case she has had some fluid in her ears that might be causing some of the symptoms.  Meclizine given for as needed use and she knows to use this sparingly and only if needed.  We discussed consideration for referral to vestibular rehab if needed and that she would be amenable to this if symptoms do not get better at home.  She understands red flag signs and symptoms which would warrant further evaluation I will follow-up as needed  No orders of the defined types were placed in this encounter.  No orders of the defined types were placed in this encounter.    [name] Norlander, DO [place] [phone]

## 2021-08-04 ENCOUNTER — Ambulatory Visit (INDEPENDENT_AMBULATORY_CARE_PROVIDER_SITE_OTHER): Payer: Medicare Other | Admitting: *Deleted

## 2021-08-04 DIAGNOSIS — E1159 Type 2 diabetes mellitus with other circulatory complications: Secondary | ICD-10-CM

## 2021-08-04 DIAGNOSIS — Z794 Long term (current) use of insulin: Secondary | ICD-10-CM

## 2021-08-04 NOTE — Patient Instructions (Signed)
Visit Information ? ? ?Patient Goals/Self-Care Activities: ?Take medications as prescribed   ?Attend all scheduled provider appointments ?Perform all self care activities independently  ?Call provider office for new concerns or questions  ?check blood sugar at prescribed times: before meals and at bedtime, when you have symptoms of low or high blood sugar, and continuously with CGM  ?take the blood sugar meter to all doctor visits ?Call PCP/endocrinologist with any readings outside of recommended range ?Call RN Care Manager as needed [phone] ? ?The patient verbalized understanding of instructions, educational materials, and care plan provided today and declined offer to receive copy of patient instructions, educational materials, and care plan.  ? ?Plan:Telephone follow up appointment with care management team member scheduled for:  09/04/21 with RNCM ?The patient has been provided with contact information for the care management team and has been advised to call with any health related questions or concerns.  ? ?[name], BSN, RN-BC ?Embedded Chronic Care Manager ?[place] [facility] [place] Management ?Direct Dial: [phone] ?  ?

## 2021-08-04 NOTE — Chronic Care Management (AMB) (Signed)
?Chronic Care Management  ? ?CCM RN Visit Note ? ?08/04/2021 ?Name: [name] MRN: [id number] DOB: 04-Dec-1945 ? ?Subjective: ?[name] is a 76 y.o. year old female who is a primary care patient of [name], DO. The care management team was consulted for assistance with disease management and care coordination needs.   ? ?Engaged with patient by telephone for follow up visit in response to provider referral for case management and/or care coordination services.  ? ?Consent to Services:  ?The patient was given information about Chronic Care Management services, agreed to services, and gave verbal consent prior to initiation of services.  Please see initial visit note for detailed documentation.  ? ?Patient agreed to services and verbal consent obtained.  ? ?Assessment: Review of patient past medical history, allergies, medications, health status, including review of consultants reports, laboratory and other test data, was performed as part of comprehensive evaluation and provision of chronic care management services.  ? ?SDOH (Social Determinants of Health) assessments and interventions performed:   ? ?CCM Care Plan ? ?Allergies  ?Allergen Reactions  ? Aspirin Nausea And Vomiting  ? Ciprofloxacin Other (See Comments)  ?  Upset Stomach  ? Codeine Nausea And Vomiting  ?  Makes me sick   ? Losartan   ?  Hyperkalemia ?  ? Micardis [Telmisartan] Other (See Comments)  ?  unknown  ? Nexium [Esomeprazole Magnesium] Other (See Comments)  ?  Causes internal bleeding  ? Niaspan [Niacin Er] Other (See Comments)  ?  Reaction is unknown  ? Onglyza [Saxagliptin] Other (See Comments)  ?  Reaction is unknown  ? Other   ?  No otc pain medications  ? Rofecoxib Other (See Comments)  ?  unknown  ? Simvastatin   ? Tequin [Gatifloxacin] Other (See Comments)  ?  Reaction is unknown  ? Welchol [Colesevelam Hcl]   ? Amoxicillin Nausea And Vomiting and Rash  ?  Has patient had a PCN reaction causing immediate rash,  facial/tongue/throat swelling, SOB or lightheadedness with hypotension: Yes ?Has patient had a PCN reaction causing severe rash involving mucus membranes or skin necrosis: no ? ?Has patient had a PCN reaction that required hospitalization No ?Has patient had a PCN reaction occurring within the last 10 years: No ?If all of the above answers are "NO", then may proceed with Cephalosporin use. ?  ? ? ?Outpatient Encounter Medications as of 08/04/2021  ?Medication Sig  ? amLODipine (NORVASC) 5 MG tablet TAKE 1 TABLET EVERY DAY  ? atorvastatin (LIPITOR) 10 MG tablet TAKE 1 TABLET EVERY DAY  ? carvedilol (COREG) 12.5 MG tablet Take 1 tablet (12.5 mg total) by mouth 2 (two) times daily with a meal.  ? Cholecalciferol 25 MCG (1000 UT) capsule Take 1 capsule (1,000 Units total) by mouth daily.  ? colchicine 0.6 MG tablet Take 0.6 mg by mouth daily as needed.  ? Febuxostat 80 MG TABS Take by mouth.  ? fluticasone (FLONASE) 50 MCG/ACT nasal spray Place 2 sprays into both nostrils daily. Use 2 sprays in each nostril daily  ? furosemide (LASIX) 40 MG tablet TAKE 1 TABLET TWICE WEEKLY  ? Insulin Glargine (BASAGLAR KWIKPEN) 100 UNIT/ML Inject 8 Units into the skin daily.  ? Lancets Misc. MISC Use as directed to check blood sugar up to 4x daily. E11.65  ? levocetirizine (XYZAL) 5 MG tablet Take 1 tablet (5 mg total) by mouth at bedtime as needed for allergies (allergies/ ear fullness).  ? levothyroxine (SYNTHROID) 75 MCG  tablet Take 1 tablet (75 mcg total) by mouth daily before breakfast.  ? meclizine (ANTIVERT) 25 MG tablet Take 0.5-1 tablets (12.5-25 mg total) by mouth 3 (three) times daily as needed for dizziness.  ? pantoprazole (PROTONIX) 20 MG tablet Take 1 tablet (20 mg total) by mouth daily.  ? prednisoLONE acetate (PRED FORTE) 1 % ophthalmic suspension Place 1 drop into the right eye 4 (four) times daily.  ? ULTICARE MICRO PEN NEEDLES 32G X 4 MM MISC TWICE DAILY  ? ?No facility-administered encounter medications on file as  of 08/04/2021.  ? ? ?Patient Active Problem List  ? Diagnosis Date Noted  ? Idiopathic chronic gout, multiple sites, with tophus (tophi) 04/03/2021  ? Overweight 04/03/2021  ? Polyarthralgia 04/03/2021  ? Cystoid macular edema, right eye 01/14/2020  ? Follow-up examination after eye surgery 11/01/2019  ? Controlled type 2 diabetes mellitus with stable proliferative retinopathy of both eyes, with long-term current use of insulin ([place]) 10/02/2019  ? 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 Retinal hemorrhage of left eye 10/02/2019  ? Retinal microaneurysm of both eyes 10/02/2019  ? Dislocated intraocular lens, initial encounter 10/02/2019  ? Vitamin D deficiency 11/15/2017  ? Foot ulcer ([place]) 06/02/2016  ? 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 Cellulitis 05/05/2016  ? Diabetic foot infection ([place]) 05/05/2016  ? Nausea and vomiting 01/23/2015  ? CKD (chronic kidney disease), stage III ([place])   ? Anemia in chronic renal disease   ? Gastroesophageal reflux disease without esophagitis 10/11/2014  ? Hypothyroidism 05/18/2014  ? Hip fracture requiring operative repair [facility])   ? Fall   ? Osteoporosis 09/06/2013  ? Type 2 diabetes mellitus with stage 3b chronic kidney disease, with long-term current use of insulin ([place]) 07/20/2013  ? Edema 03/03/2011  ? Mixed hyperlipidemia   ? Diabetic retinopathy ([place]) 10/28/2007  ? Hyperlipidemia associated with type 2 diabetes mellitus ([place]) 10/28/2007  ? Anxiety state 10/28/2007  ? Depression 10/28/2007  ? Essential hypertension 10/28/2007  ? Disorder resulting from impaired renal function 10/28/2007  ? COLONIC POLYPS, HYPERPLASTIC 11/17/2006  ? DIVERTICULOSIS, COLON 11/17/2006  ? ? ?Conditions to be addressed/monitored:HTN, DMII, and CKD Stage 3 ? ?Care Plan : [facility] Care Plan  ?Updates made by Ilean China, RN since 08/04/2021 12:00 AM  ?  ? ?Problem: Chronic Disease Management  Needs   ?Priority: High  ?Onset Date: 08/04/2021  ?  ? ?Long-Range Goal: Patient Will Work with Consulting civil engineer Regarding Care Management and [place] with CKD stage 3, Type 2 DM, HTN, HLD, Anxiety State, Depression, Macular Degeneration both eyes, visual impairment   ?Start Date: 08/04/2021  ?Expected End Date: 08/05/2022  ?This Visit's Progress: On track  ?Priority: High  ?Note:   ?Current Barriers:  ?Chronic Disease Management support and education needs related to CKD stage 3, Type 2 DM, HTN, HLD, Anxiety State, Depression, Macular Degeneration both eyes, visual impairment ? ?RNCM Clinical Goal(s):  ?Patient will continue to work with RN Care Manager and/or Social Worker to address care management and care coordination needs related to CKD stage 3, Type 2 DM, HTN, HLD, Anxiety State, Depression, Macular Degeneration both eyes, visual impairment as evidenced by adherence to CM Team Scheduled appointments     through collaboration with PharmD, LCSW, provider, and care team.  ? ?Interventions: ?1:1 collaboration with primary care provider regarding development and update of comprehensive plan of care as evidenced by provider  attestation and co-signature ?Inter-disciplinary care team collaboration (see longitudinal plan of care) ?Evaluation of current treatment plan related to  self management and patient's adherence to plan as established by provider ? ? ?Diabetes:  (Status: Goal on Track (progressing): YES.) Long Term Goal  ? ?Lab Results  ?Component Value Date  ? HGBA1C 7.5 (H) 04/01/2021  ?  ?Assessed patient's understanding of A1c goal: <8% ?Reviewed medications with patient and discussed importance of medication adherence;        ?Reviewed prescribed diet with patient carb modified/ADA Diet. Patient eats mostly home cooked meals; ?Counseled on importance of regular laboratory monitoring as prescribed;        ?Discussed plans with patient for ongoing care management follow up and provided  patient with direct contact information for care management team;      ?Advised patient, providing education and rationale, to check cbg before meals and at bedtime, when you have symptoms of low or high blood sugar,

## 2021-08-06 ENCOUNTER — Telehealth: Payer: Self-pay | Admitting: Family Medicine

## 2021-08-06 DIAGNOSIS — H8112 Benign paroxysmal vertigo, left ear: Secondary | ICD-10-CM

## 2021-08-06 NOTE — Telephone Encounter (Signed)
Pt called stating that she had a visit with PCP on 3/8 for vertigo and says that it has gotten a little better but not a lot better. Only has 2 pills left. Wants to go ahead and proceed with getting referral sent to ENT in [place]. ?

## 2021-08-07 NOTE — Telephone Encounter (Signed)
Patient aware.

## 2021-08-07 NOTE — Telephone Encounter (Signed)
done

## 2021-08-11 DIAGNOSIS — M79676 Pain in unspecified toe(s): Secondary | ICD-10-CM | POA: Diagnosis not present

## 2021-08-11 DIAGNOSIS — E1151 Type 2 diabetes mellitus with diabetic peripheral angiopathy without gangrene: Secondary | ICD-10-CM | POA: Diagnosis not present

## 2021-08-11 DIAGNOSIS — B351 Tinea unguium: Secondary | ICD-10-CM | POA: Diagnosis not present

## 2021-08-11 DIAGNOSIS — L84 Corns and callosities: Secondary | ICD-10-CM | POA: Diagnosis not present

## 2021-08-17 ENCOUNTER — Telehealth: Payer: Self-pay | Admitting: Family Medicine

## 2021-08-17 DIAGNOSIS — M1A09X1 Idiopathic chronic gout, multiple sites, with tophus (tophi): Secondary | ICD-10-CM | POA: Diagnosis not present

## 2021-08-17 DIAGNOSIS — M2550 Pain in unspecified joint: Secondary | ICD-10-CM | POA: Diagnosis not present

## 2021-08-17 DIAGNOSIS — Z6827 Body mass index (BMI) 27.0-27.9, adult: Secondary | ICD-10-CM | POA: Diagnosis not present

## 2021-08-17 DIAGNOSIS — Z79899 Other long term (current) drug therapy: Secondary | ICD-10-CM | POA: Diagnosis not present

## 2021-08-17 DIAGNOSIS — E663 Overweight: Secondary | ICD-10-CM | POA: Diagnosis not present

## 2021-08-21 DIAGNOSIS — E1122 Type 2 diabetes mellitus with diabetic chronic kidney disease: Secondary | ICD-10-CM

## 2021-08-21 DIAGNOSIS — N183 Chronic kidney disease, stage 3 unspecified: Secondary | ICD-10-CM

## 2021-08-21 DIAGNOSIS — I10 Essential (primary) hypertension: Secondary | ICD-10-CM | POA: Diagnosis not present

## 2021-08-21 DIAGNOSIS — Z794 Long term (current) use of insulin: Secondary | ICD-10-CM

## 2021-08-21 DIAGNOSIS — E1159 Type 2 diabetes mellitus with other circulatory complications: Secondary | ICD-10-CM

## 2021-08-21 DIAGNOSIS — R42 Dizziness and giddiness: Secondary | ICD-10-CM | POA: Insufficient documentation

## 2021-08-21 DIAGNOSIS — I152 Hypertension secondary to endocrine disorders: Secondary | ICD-10-CM

## 2021-08-27 ENCOUNTER — Telehealth: Payer: Self-pay | Admitting: "Endocrinology

## 2021-08-27 DIAGNOSIS — E034 Atrophy of thyroid (acquired): Secondary | ICD-10-CM

## 2021-08-27 MED ORDER — LEVOTHYROXINE SODIUM 75 MCG PO TABS: 75.0000 ug | ORAL_TABLET | Freq: Every day | ORAL | 0 refills | Status: DC

## 2021-08-27 NOTE — Telephone Encounter (Signed)
Pt requesting a refill ? ?levothyroxine (SYNTHROID) 75 MCG tablet  ? ? ?[place], [place] Phone:  [phone]  ?Fax:  [phone]  ?  ? ?

## 2021-08-27 NOTE — Telephone Encounter (Signed)
Rx refill sent.

## 2021-09-03 ENCOUNTER — Other Ambulatory Visit: Payer: Self-pay

## 2021-09-03 DIAGNOSIS — E034 Atrophy of thyroid (acquired): Secondary | ICD-10-CM

## 2021-09-03 MED ORDER — LEVOTHYROXINE SODIUM 75 MCG PO TABS: 75.0000 ug | ORAL_TABLET | Freq: Every day | ORAL | 1 refills | Status: DC

## 2021-09-07 ENCOUNTER — Other Ambulatory Visit: Payer: Self-pay | Admitting: Family Medicine

## 2021-09-08 ENCOUNTER — Telehealth: Payer: Medicare Other

## 2021-09-09 ENCOUNTER — Telehealth: Payer: Medicare Other

## 2021-09-28 ENCOUNTER — Encounter (HOSPITAL_COMMUNITY): Payer: Self-pay | Admitting: *Deleted

## 2021-09-29 ENCOUNTER — Ambulatory Visit (INDEPENDENT_AMBULATORY_CARE_PROVIDER_SITE_OTHER): Payer: Medicare Other | Admitting: Family Medicine

## 2021-09-29 ENCOUNTER — Encounter: Payer: Self-pay | Admitting: Family Medicine

## 2021-09-29 VITALS — BP 134/55 | HR 57 | Temp 98.4°F | Ht 62.0 in | Wt 148.8 lb

## 2021-09-29 DIAGNOSIS — E1122 Type 2 diabetes mellitus with diabetic chronic kidney disease: Secondary | ICD-10-CM | POA: Diagnosis not present

## 2021-09-29 DIAGNOSIS — Z794 Long term (current) use of insulin: Secondary | ICD-10-CM

## 2021-09-29 DIAGNOSIS — Z7409 Other reduced mobility: Secondary | ICD-10-CM

## 2021-09-29 DIAGNOSIS — I152 Hypertension secondary to endocrine disorders: Secondary | ICD-10-CM | POA: Diagnosis not present

## 2021-09-29 DIAGNOSIS — E1169 Type 2 diabetes mellitus with other specified complication: Secondary | ICD-10-CM | POA: Diagnosis not present

## 2021-09-29 DIAGNOSIS — N183 Chronic kidney disease, stage 3 unspecified: Secondary | ICD-10-CM

## 2021-09-29 DIAGNOSIS — Z8631 Personal history of diabetic foot ulcer: Secondary | ICD-10-CM

## 2021-09-29 DIAGNOSIS — H8112 Benign paroxysmal vertigo, left ear: Secondary | ICD-10-CM

## 2021-09-29 DIAGNOSIS — E1159 Type 2 diabetes mellitus with other circulatory complications: Secondary | ICD-10-CM | POA: Diagnosis not present

## 2021-09-29 DIAGNOSIS — E039 Hypothyroidism, unspecified: Secondary | ICD-10-CM | POA: Diagnosis not present

## 2021-09-29 DIAGNOSIS — E785 Hyperlipidemia, unspecified: Secondary | ICD-10-CM

## 2021-09-29 DIAGNOSIS — N1832 Chronic kidney disease, stage 3b: Secondary | ICD-10-CM | POA: Diagnosis not present

## 2021-09-29 LAB — BAYER DCA HB A1C WAIVED: HB A1C (BAYER DCA - WAIVED): 7.1 % — ABNORMAL HIGH (ref 4.8–5.6)

## 2021-09-29 MED ORDER — CARVEDILOL 12.5 MG PO TABS: ORAL_TABLET | ORAL | 3 refills | Status: DC

## 2021-09-29 MED ORDER — PANTOPRAZOLE SODIUM 20 MG PO TBEC: 20.0000 mg | DELAYED_RELEASE_TABLET | Freq: Every day | ORAL | 3 refills | Status: DC

## 2021-09-29 MED ORDER — AMLODIPINE BESYLATE 5 MG PO TABS: 5.0000 mg | ORAL_TABLET | Freq: Every day | ORAL | 3 refills | Status: DC

## 2021-09-29 MED ORDER — ATORVASTATIN CALCIUM 10 MG PO TABS: 10.0000 mg | ORAL_TABLET | Freq: Every day | ORAL | Status: DC

## 2021-09-29 MED ORDER — FUROSEMIDE 40 MG PO TABS: ORAL_TABLET | ORAL | 3 refills | Status: DC

## 2021-09-29 MED ORDER — CHOLECALCIFEROL 25 MCG (1000 UT) PO CAPS: 1000.0000 [IU] | ORAL_CAPSULE | Freq: Every day | ORAL | 3 refills | Status: DC

## 2021-09-29 MED ORDER — LEVOCETIRIZINE DIHYDROCHLORIDE 5 MG PO TABS: 5.0000 mg | ORAL_TABLET | Freq: Every evening | ORAL | 3 refills | Status: DC | PRN

## 2021-09-29 NOTE — Progress Notes (Signed)
? ?Subjective: ?CC:DM ?PCP: [name], DO ?KGM:WNUUV H Lusk is a 76 y.o. female presenting to clinic today for: ? ?1. Type 2 Diabetes with hypertension, hyperlipidemia:  ?Patient is typically managed by her endocrinologist, whom she will see soon.  She would like to go ahead with labs today in anticipation of that appointment. she has been very strict on her diet and has lost some weight as a result.  She is compliant with all of her medications including her insulin.  She does not need refills on that at this time ? ?Last eye exam: sees Dr [name] ?Last foot exam: needs ?Last A1c:  ?Lab Results  ?Component Value Date  ? HGBA1C 7.5 (H) 04/01/2021  ? ?Nephropathy screen indicated?: needs ?Last flu, zoster and/or pneumovax:  ?Immunization History  ?Administered Date(s) Administered  ? Fluad Quad(high Dose 65+) 02/05/2019, 03/05/2020, 03/10/2021  ? Influenza, High Dose Seasonal PF 03/05/2016, 03/14/2017, 03/01/2018  ? Influenza,inj,Quad PF,6+ Mos 02/16/2013, 02/27/2015  ? Moderna Covid-19 Vaccine Bivalent Booster 47yr & up 03/10/2021  ? Moderna Sars-Covid-2 Vaccination 10/14/2020  ? PFIZER(Purple Top)SARS-COV-2 Vaccination 06/13/2019, 07/04/2019, 03/05/2020  ? Pneumococcal Conjugate-13 10/11/2014  ? Pneumococcal Polysaccharide-23 10/10/2012  ? Tdap 10/06/2012  ? Zoster Recombinat (Shingrix) 06/02/2021, 08/04/2021  ? ? ?ROS: no chest pain, SOB, edema, falls. Has poor vision. ? ?2. Impaired mobility ?Patient uses cane for ambulation typically. No reported falls.  Her husband helps her a lot. Needs a new handicap placard form. ? ? ? ?ROS: Per HPI ? ?Allergies  ?Allergen Reactions  ? Aspirin Nausea And Vomiting  ? Ciprofloxacin Other (See Comments)  ?  Upset Stomach  ? Codeine Nausea And Vomiting  ?  Makes me sick   ? Losartan   ?  Hyperkalemia ?  ? Micardis [Telmisartan] Other (See Comments)  ?  unknown  ? Nexium [Esomeprazole Magnesium] Other (See Comments)  ?  Causes internal bleeding  ? Niaspan [Niacin  Er] Other (See Comments)  ?  Reaction is unknown  ? Onglyza [Saxagliptin] Other (See Comments)  ?  Reaction is unknown  ? Other   ?  No otc pain medications  ? Rofecoxib Other (See Comments)  ?  unknown  ? Simvastatin   ? Tequin [Gatifloxacin] Other (See Comments)  ?  Reaction is unknown  ? Welchol [Colesevelam Hcl]   ? Amoxicillin Nausea And Vomiting and Rash  ?  Has patient had a PCN reaction causing immediate rash, facial/tongue/throat swelling, SOB or lightheadedness with hypotension: Yes ?Has patient had a PCN reaction causing severe rash involving mucus membranes or skin necrosis: no ? ?Has patient had a PCN reaction that required hospitalization No ?Has patient had a PCN reaction occurring within the last 10 years: No ?If all of the above answers are "NO", then may proceed with Cephalosporin use. ?  ? ?Past Medical History:  ?Diagnosis Date  ? Anal fistula   ? Anemia in chronic renal disease   ? Aranesp injection --  when Hg <11, last injection 12-24-14.  ? Anxiety   ? Aphakia of eye, right 10/02/2019  ? Condition resolved, status post vitrectomy, removal IOL, insertion Yamani scleral tunnel [facility] CT Lucio lens June 2021  ? Arthritis   ? knees and hand/fingers. "broke back"-being evaluated for this"weakness left leg"  ? Cataract   ? both eyes done  ? 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 CKD (chronic kidney disease), stage III [place]   ? nephrologist--  dr mMercy Moore- LOV  07-09-2016  ? Complication of anesthesia   ? post-op confusion   ? Constipation   ? Coronary artery disease   ? Diabetic gastroparesis ([place])   ? Diabetic retinopathy ([place])   ? bilateral --  monitored by dr [name]  ? Diverticulosis of colon   ? GAD (generalized anxiety disorder)   ? GERD (gastroesophageal reflux disease)   ? History of colon polyps   ? benign  ? History of esophagitis   ? History of GI bleed   ? upper 2009  due to esophagitis  &  2002  due to Mallory-Weiss tear  ? History of hyperkalemia   ? pt had  previously been canceled twice dos due to elevated K+, 07-29-2016 last date cancelled -- pt visited pcp same day treated w/ kayexelate and K+ came down, pt brought all her medication's in to pcp office and found pt was taking losartan that had been discontinued due to ckd, pcp stated this was cause of elevated K+  ? History of Mallory-Weiss syndrome   ? 12/ 2002--  resolved  ? History of rectal abscess   ? 12-29-2004  bedside I & D  ? Hyperlipidemia   ? Hypertension   ? Hypothyroidism   ? LAFB (left anterior fascicular block)   ? Right bundle branch block   ? Sacral decubitus ulcer   ? since 2014- 01-02-15 remains with wound" gauze dressing changes daily.  ? Type II diabetes mellitus ([place])   ? ? ?Current Outpatient Medications:  ?  amLODipine (NORVASC) 5 MG tablet, TAKE 1 TABLET EVERY DAY, Disp: 90 tablet, Rfl: 0 ?  atorvastatin (LIPITOR) 10 MG tablet, TAKE 1 TABLET EVERY DAY, Disp: 90 tablet, Rfl: 0 ?  carvedilol (COREG) 12.5 MG tablet, TAKE 1 TABLET TWICE DAILY WITH A MEAL, Disp: 180 tablet, Rfl: 0 ?  Cholecalciferol 25 MCG (1000 UT) capsule, Take 1 capsule (1,000 Units total) by mouth daily., Disp: 90 capsule, Rfl: 3 ?  colchicine 0.6 MG tablet, Take 0.6 mg by mouth daily as needed., Disp: , Rfl:  ?  Febuxostat 80 MG TABS, Take by mouth., Disp: , Rfl:  ?  fluticasone (FLONASE) 50 MCG/ACT nasal spray, Place 2 sprays into both nostrils daily. Use 2 sprays in each nostril daily, Disp: 16 g, Rfl: 3 ?  furosemide (LASIX) 40 MG tablet, TAKE 1 TABLET TWICE WEEKLY, Disp: 26 tablet, Rfl: 0 ?  Insulin Glargine (BASAGLAR KWIKPEN) 100 UNIT/ML, Inject 8 Units into the skin daily., Disp: 9 mL, Rfl: 3 ?  Lancets Misc. MISC, Use as directed to check blood sugar up to 4x daily. E11.65, Disp: 300 each, Rfl: 3 ?  levocetirizine (XYZAL) 5 MG tablet, Take 1 tablet (5 mg total) by mouth at bedtime as needed for allergies (allergies/ ear fullness)., Disp: 30 tablet, Rfl: 0 ?  levothyroxine (SYNTHROID) 75 MCG tablet, Take 1 tablet (75  mcg total) by mouth daily before breakfast., Disp: 90 tablet, Rfl: 1 ?  meclizine (ANTIVERT) 25 MG tablet, Take 0.5-1 tablets (12.5-25 mg total) by mouth 3 (three) times daily as needed for dizziness., Disp: 30 tablet, Rfl: 0 ?  pantoprazole (PROTONIX) 20 MG tablet, TAKE 1 TABLET EVERY DAY, Disp: 90 tablet, Rfl: 0 ?  prednisoLONE acetate (PRED FORTE) 1 % ophthalmic suspension, Place 1 drop into the right eye 4 (four) times daily., Disp: 5 mL, Rfl: 0 ?  ULTICARE MICRO PEN NEEDLES 32G X 4 MM MISC, TWICE DAILY, Disp: 100 each, Rfl: 5 ?Social History  ? ?Socioeconomic History  ?  Marital status: Married  ?  Spouse name: [name]  ? Number of children: 0  ? Years of education: 79  ? Highest education level: Not on file  ?Occupational History  ? Occupation: Retired  ?  Employer: RETIRED  ?Tobacco Use  ? Smoking status: Never  ? Smokeless tobacco: Never  ?Vaping Use  ? Vaping Use: Never used  ?Substance and Sexual Activity  ? Alcohol use: No  ? Drug use: No  ? Sexual activity: Not Currently  ?Other Topics Concern  ? Not on file  ?Social History Narrative  ? Lives with husband.   ? Caffeine use: none  ? ?Social Determinants of Health  ? ?Financial Resource Strain: Low Risk   ? Difficulty of Paying Living Expenses: Not hard at all  ?Food Insecurity: No Food Insecurity  ? Worried About Charity fundraiser in the Last Year: Never true  ? Ran Out of Food in the Last Year: Never true  ?Transportation Needs: No Transportation Needs  ? Lack of Transportation (Medical): No  ? Lack of Transportation (Non-Medical): No  ?Physical Activity: Inactive  ? Days of Exercise per Week: 0 days  ? Minutes of Exercise per Session: 0 min  ?Stress: Stress Concern Present  ? Feeling of Stress : To some extent  ?Social Connections: Moderately Isolated  ? Frequency of Communication with Friends and Family: More than three times a week  ? Frequency of Social Gatherings with Friends and Family: More than three times a week  ? Attends Religious Services:  Never  ? Active Member of Clubs or Organizations: No  ? Attends Archivist Meetings: Never  ? Marital Status: Married  ?Intimate Partner Violence: Not At Risk  ? Fear of Current or Ex-Partner: No  ?

## 2021-09-29 NOTE — Addendum Note (Signed)
Addended by: [name] on: 09/29/2021 12:30 PM ? ? Modules accepted: Orders ? ?

## 2021-09-30 LAB — COMPREHENSIVE METABOLIC PANEL
ALT: 4 IU/L (ref 0–32)
AST: 11 IU/L (ref 0–40)
Albumin/Globulin Ratio: 1.5 (ref 1.2–2.2)
Albumin: 4.1 g/dL (ref 3.7–4.7)
Alkaline Phosphatase: 95 IU/L (ref 44–121)
BUN/Creatinine Ratio: 21 (ref 12–28)
BUN: 37 mg/dL — ABNORMAL HIGH (ref 8–27)
Bilirubin Total: 0.4 mg/dL (ref 0.0–1.2)
CO2: 21 mmol/L (ref 20–29)
Calcium: 9.3 mg/dL (ref 8.7–10.3)
Chloride: 105 mmol/L (ref 96–106)
Creatinine, Ser: 1.75 mg/dL — ABNORMAL HIGH (ref 0.57–1.00)
Globulin, Total: 2.7 g/dL (ref 1.5–4.5)
Glucose: 126 mg/dL — ABNORMAL HIGH (ref 70–99)
Potassium: 5.5 mmol/L — ABNORMAL HIGH (ref 3.5–5.2)
Sodium: 139 mmol/L (ref 134–144)
Total Protein: 6.8 g/dL (ref 6.0–8.5)
eGFR: 30 mL/min/{1.73_m2} — ABNORMAL LOW (ref >59)

## 2021-09-30 LAB — T4, FREE: Free T4: 1.88 ng/dL — ABNORMAL HIGH (ref 0.82–1.77)

## 2021-09-30 LAB — TSH: TSH: 0.417 u[IU]/mL — ABNORMAL LOW (ref 0.450–4.500)

## 2021-10-13 DIAGNOSIS — L03031 Cellulitis of right toe: Secondary | ICD-10-CM | POA: Diagnosis not present

## 2021-10-20 ENCOUNTER — Encounter: Payer: Self-pay | Admitting: "Endocrinology

## 2021-10-20 ENCOUNTER — Ambulatory Visit (INDEPENDENT_AMBULATORY_CARE_PROVIDER_SITE_OTHER): Payer: Medicare Other | Admitting: "Endocrinology

## 2021-10-20 VITALS — BP 146/62 | HR 80 | Ht 62.0 in | Wt 149.4 lb

## 2021-10-20 DIAGNOSIS — L03032 Cellulitis of left toe: Secondary | ICD-10-CM | POA: Diagnosis not present

## 2021-10-20 DIAGNOSIS — Z794 Long term (current) use of insulin: Secondary | ICD-10-CM | POA: Diagnosis not present

## 2021-10-20 DIAGNOSIS — E034 Atrophy of thyroid (acquired): Secondary | ICD-10-CM

## 2021-10-20 DIAGNOSIS — I10 Essential (primary) hypertension: Secondary | ICD-10-CM | POA: Diagnosis not present

## 2021-10-20 DIAGNOSIS — E1122 Type 2 diabetes mellitus with diabetic chronic kidney disease: Secondary | ICD-10-CM

## 2021-10-20 DIAGNOSIS — E782 Mixed hyperlipidemia: Secondary | ICD-10-CM

## 2021-10-20 DIAGNOSIS — N1832 Chronic kidney disease, stage 3b: Secondary | ICD-10-CM

## 2021-10-20 DIAGNOSIS — L03031 Cellulitis of right toe: Secondary | ICD-10-CM | POA: Diagnosis not present

## 2021-10-20 MED ORDER — LEVOTHYROXINE SODIUM 50 MCG PO TABS: 75.0000 ug | ORAL_TABLET | Freq: Every day | ORAL | 1 refills | Status: DC

## 2021-10-20 NOTE — Progress Notes (Signed)
10/20/2021   Endocrinology follow-up note   Subjective:    Patient ID: [name], female    DOB: 1946-04-13.  She is being seen in follow-up in the management of uncontrolled type 2 diabetes, hypothyroidism, hypertension, hyperlipidemia.  Past Medical History:  Diagnosis Date   Anal fistula    Anemia in chronic renal disease    Aranesp injection --  when Hg <11, last injection 12-24-14.   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    CKD (chronic kidney disease), stage III [facility])    nephrologist--  dr Mercy Moore-- LOV  [phone]   Complication of anesthesia    post-op confusion    Constipation    Coronary artery disease    Diabetic gastroparesis ([place])    Diabetic retinopathy ([place])    bilateral --  monitored by dr [name]   Diverticulosis of colon    GAD (generalized anxiety disorder)    GERD (gastroesophageal reflux disease)    History of colon polyps    benign   History of esophagitis    History of GI bleed    upper 2009  due to esophagitis  &  2002  due to Mallory-Weiss tear   History of hyperkalemia    pt had previously been canceled twice dos due to elevated K+, 07-29-2016 last date cancelled -- pt visited pcp same day treated w/ kayexelate and K+ came down, pt brought all her medication's in to pcp office and found pt was taking losartan that had been discontinued due to ckd, pcp stated this was cause of elevated K+   History of Mallory-Weiss syndrome    12/ 2002--  resolved   History of rectal abscess    12-29-2004  bedside I & D   Hyperlipidemia    Hypertension    Hypothyroidism    LAFB (left anterior fascicular block)    Right bundle branch block     Sacral decubitus ulcer    since 2014- 01-02-15 remains with wound" gauze dressing changes daily.   Type II diabetes mellitus ([place])    Past Surgical History:  Procedure Laterality Date   APPLICATION OF A-CELL OF EXTREMITY N/A 04/07/2015   Procedure: A CELL PLACMENT;  Surgeon: Loel Lofty Dillingham, DO;  Location: [place];  Service: Plastics;  Laterality: N/A;   CARDIOVASCULAR STRESS TEST  12-30-2004   normal perfusion study/  no ischemia or infartion/  normal LV wall function and wall motion , ef 66%   CATARACT EXTRACTION W/ INTRAOCULAR LENS  IMPLANT, BILATERAL  1995   COLONOSCOPY  2008   w/Dr.Brodie    COLONOSCOPY W/ POLYPECTOMY  last one 2008   COMPRESSION HIP SCREW Right 05/18/2014   Procedure: COMPRESSION HIP;  Surgeon: Carole Civil, MD;  Location: [facility];  Service: Orthopedics;  Laterality: Right;   ESOPHAGOGASTRODUODENOSCOPY  last one 01-09-2011   EVALUATION UNDER ANESTHESIA WITH FISTULECTOMY N/A 01/06/2015   Procedure: EXAM UNDER ANESTHESIA , placement of seton;  Surgeon: Jackolyn Confer, MD;  Location: [facility];  Service: General;  Laterality: N/A;   I & D EXTREMITY N/A 04/07/2015   Procedure: IRRIGATION AND DEBRIDEMENT ISCHIAL ULCER;  Surgeon: Loel Lofty Dillingham, DO;  Location: [place];  Service: Plastics;  Laterality: N/A;   INCISION AND DRAINAGE OF WOUND N/A 09/30/2014   Procedure: IRRIGATION AND DEBRIDEMENT SACRAL WOUND, EXCISION OF PERIRECTAL TRACT WITH PLACEMENT OF ACCELL;  Surgeon: Theodoro Kos, DO;  Location: [place];  Service: Plastics;  Laterality: N/A;   LIGATION OF INTERNAL FISTULA TRACT N/A 10/22/2016   Procedure: LIGATION OF INTERNAL FISTULA TRACT;  Surgeon: Leighton Ruff, MD;  Location[facility];  Service: General;  Laterality: N/A;   ORIF FEMUR FRACTURE Left 10/09/2012   Procedure: OPEN REDUCTION INTERNAL FIXATION (ORIF) DISTAL FEMUR FRACTURE;  Surgeon: Rozanna Box, MD;  Location: [place];  Service: Orthopedics;  Laterality: Left;    RETINOPATHY SURGERY Bilateral 1980's?   TRANSTHORACIC ECHOCARDIOGRAM  02-18-2011   mild LVH,  ef 55-60%,  grade I diastolic dysfunction/  mild TR/  RV systolic pressure increased consistant with moderate pulmonary hypertension   Social History   Socioeconomic History   Marital status: Married    Spouse name: [name]   Number of children: 0   Years of education: 12   Highest education level: Not on file  Occupational History   Occupation: Retired    Fish farm manager: RETIRED  Tobacco Use   Smoking status: Never   Smokeless tobacco: Never  Vaping Use   Vaping Use: Never used  Substance and Sexual Activity   Alcohol use: No   Drug use: No   Sexual activity: Not Currently  Other Topics Concern   Not on file  Social History Narrative   Lives with husband.    Caffeine use: none   Social Determinants of Radio broadcast assistant Strain: Low Risk    Difficulty of Paying Living Expenses: Not hard at all  Food Insecurity: No Food Insecurity   Worried About Charity fundraiser in the Last Year: Never true   Arboriculturist in the Last Year: Never true  Transportation Needs: No Transportation Needs   Lack of Transportation (Medical): No   Lack of Transportation (Non-Medical): No  Physical Activity: Inactive   Days of Exercise per Week: 0 days   Minutes of Exercise per Session: 0 min  Stress: Stress Concern Present   Feeling of Stress : To some extent  Social Connections: Moderately Isolated   Frequency of Communication with Friends and Family: More than three times a week   Frequency of Social Gatherings with Friends and Family: More than three times a week   Attends Religious Services: Never   Marine scientist or Organizations: No   Attends Archivist Meetings: Never   Marital Status: Married   Outpatient Encounter Medications as of 10/20/2021  Medication Sig   amLODipine (NORVASC) 5 MG tablet Take 1 tablet (5 mg total) by mouth daily.   atorvastatin (LIPITOR) 10  MG tablet Take 1 tablet (10 mg total) by mouth daily.   carvedilol (COREG) 12.5 MG tablet TAKE 1 TABLET TWICE DAILY WITH A MEAL   Cholecalciferol 25 MCG (1000 UT) capsule Take 1 capsule (1,000 Units total) by mouth daily.   colchicine 0.6 MG tablet Take 0.6 mg by mouth daily as needed.   Febuxostat 80 MG TABS Take by mouth.   fluticasone (FLONASE) 50 MCG/ACT nasal spray Place 2 sprays into both nostrils daily. Use 2 sprays in each nostril daily   furosemide (LASIX) 40 MG tablet Take 1 tablet twice weekly   Insulin Glargine (BASAGLAR KWIKPEN) 100 UNIT/ML Inject 8 Units into the skin daily.  Lancets Misc. MISC Use as directed to check blood sugar up to 4x daily. E11.65   levocetirizine (XYZAL) 5 MG tablet Take 1 tablet (5 mg total) by mouth at bedtime as needed for allergies (allergies/ ear fullness).   levothyroxine (SYNTHROID) 50 MCG tablet Take 1.5 tablets (75 mcg total) by mouth daily before breakfast.   meclizine (ANTIVERT) 25 MG tablet Take 0.5-1 tablets (12.5-25 mg total) by mouth 3 (three) times daily as needed for dizziness.   pantoprazole (PROTONIX) 20 MG tablet Take 1 tablet (20 mg total) by mouth daily.   prednisoLONE acetate (PRED FORTE) 1 % ophthalmic suspension Place 1 drop into the right eye 4 (four) times daily.   ULTICARE MICRO PEN NEEDLES 32G X 4 MM MISC TWICE DAILY   [DISCONTINUED] levothyroxine (SYNTHROID) 75 MCG tablet Take 1 tablet (75 mcg total) by mouth daily before breakfast.   No facility-administered encounter medications on file as of 10/20/2021.   ALLERGIES: Allergies  Allergen Reactions   Aspirin Nausea And Vomiting   Ciprofloxacin Other (See Comments)    Upset Stomach   Codeine Nausea And Vomiting    Makes me sick    Losartan     Hyperkalemia    Micardis [Telmisartan] Other (See Comments)    unknown   Nexium [Esomeprazole Magnesium] Other (See Comments)    Causes internal bleeding   Niaspan [Niacin Er] Other (See Comments)    Reaction is unknown    Onglyza [Saxagliptin] Other (See Comments)    Reaction is unknown   Other     No otc pain medications   Rofecoxib Other (See Comments)    unknown   Simvastatin    Tequin [Gatifloxacin] Other (See Comments)    Reaction is unknown   Welchol [Colesevelam Hcl]    Amoxicillin Nausea And Vomiting and Rash    Has patient had a PCN reaction causing immediate rash, facial/tongue/throat swelling, SOB or lightheadedness with hypotension: Yes Has patient had a PCN reaction causing severe rash involving mucus membranes or skin necrosis: no  Has patient had a PCN reaction that required hospitalization No Has patient had a PCN reaction occurring within the last 10 years: No If all of the above answers are "NO", then may proceed with Cephalosporin use.    VACCINATION STATUS: Immunization History  Administered Date(s) Administered   Fluad Quad(high Dose 65+) 02/05/2019, 03/05/2020, 03/10/2021   Influenza, High Dose Seasonal PF 03/05/2016, 03/14/2017, 03/01/2018   Influenza,inj,Quad PF,6+ Mos 02/16/2013, 02/27/2015   Moderna Covid-19 Vaccine Bivalent Booster 11yr & up 03/10/2021   Moderna Sars-Covid-2 Vaccination 10/14/2020   PFIZER(Purple Top)SARS-COV-2 Vaccination 06/13/2019, 07/04/2019, 03/05/2020   Pneumococcal Conjugate-13 10/11/2014   Pneumococcal Polysaccharide-23 10/10/2012   Tdap 10/06/2012   Zoster Recombinat (Shingrix) 06/02/2021, 08/04/2021    Diabetes She presents for her follow-up diabetic visit. She has type 2 diabetes mellitus. Onset time: She was diagnosed at approximate age of 89years . Her disease course has been improving. There are no hypoglycemic associated symptoms. Pertinent negatives for hypoglycemia include no confusion, headaches, pallor or seizures. Pertinent negatives for diabetes include no chest pain, no fatigue, no polydipsia, no polyphagia and no visual change. There are no hypoglycemic complications. Symptoms are improving. Diabetic complications include  nephropathy, peripheral neuropathy, PVD and retinopathy. Risk factors for coronary artery disease include diabetes mellitus, dyslipidemia, hypertension, sedentary lifestyle and post-menopausal. Current diabetic treatment includes oral agent (monotherapy). She is compliant with treatment most of the time. Her weight is fluctuating minimally. She is following a generally unhealthy diet. When  asked about meal planning, she reported none. She has not had a previous visit with a dietitian. She never participates in exercise. Her home blood glucose trend is fluctuating minimally. Her breakfast blood glucose range is generally 130-140 mg/dl. Her bedtime blood glucose range is generally 140-180 mg/dl. Her overall blood glucose range is 140-180 mg/dl. (She received the freestyle libre device.  She presents with her CGM device showing 65% time range, 28% level 1 hyperglycemia, 7% level 2 hyperglycemia.  No hypoglycemia.  Her point-of-care A1c is 7.1%.   ) An ACE inhibitor/angiotensin II receptor blocker is being taken. Eye exam is current.  Thyroid Problem Presents for initial visit. Onset time: 15 years. Patient reports no cold intolerance, diarrhea, fatigue, heat intolerance, palpitations or visual change. The symptoms have been stable. Past treatments include levothyroxine. The following procedures have not been performed: thyroidectomy.  Hypertension This is a chronic problem. The current episode started more than 1 year ago. Pertinent negatives include no chest pain, headaches, palpitations or shortness of breath. Past treatments include angiotensin blockers. Hypertensive end-organ damage includes PVD and retinopathy. Identifiable causes of hypertension include a thyroid problem.   Review of systems  Constitutional: + Minimally fluctuating body weight,  current  Body mass index is 27.33 kg/m. , no fatigue, no subjective hyperthermia, no subjective hypothermia    Objective:    BP (!) 146/62   Pulse 80    Ht '5\' 2"'$  (1.575 m)   Wt 149 lb 6.4 oz (67.8 kg)   BMI 27.33 kg/m   Wt Readings from Last 3 Encounters:  10/20/21 149 lb 6.4 oz (67.8 kg)  09/29/21 148 lb 12.8 oz (67.5 kg)  04/21/21 149 lb 6.4 oz (67.8 kg)     Physical Exam- Limited  Constitutional:  Body mass index is 27.33 kg/m. , not in acute distress, normal state of mind    Results for orders placed or performed in visit on 09/29/21  Bayer DCA Hb A1c Waived  Result Value Ref Range   HB A1C (BAYER DCA - WAIVED) 7.1 (H) 4.8 - 5.6 %   Diabetic Labs (most recent): Lab Results  Component Value Date   HGBA1C 7.1 (H) 09/29/2021   HGBA1C 7.5 (H) 04/01/2021   HGBA1C 7.4 (H) 10/01/2020   Lipid Panel     Component Value Date/Time   CHOL 112 10/01/2020 0812   CHOL 105 09/11/2012 1057   TRIG 68 10/01/2020 0812   TRIG 170 (H) 10/11/2014 1159   TRIG 79 09/11/2012 1057   HDL 40 10/01/2020 0812   HDL 53 10/11/2014 1159   HDL 41 09/11/2012 1057   CHOLHDL 2.8 10/01/2020 0812   LDLCALC 58 10/01/2020 0812   LDLCALC 50 02/14/2014 1012   LDLCALC 48 09/11/2012 1057     Assessment & Plan:   1. Type 2 diabetes mellitus with stage 4 chronic kidney disease, without long-term current use of insulin ([place])  - Patient has currently controlled asymptomatic type 2 DM since  76 years of age. She received the freestyle libre device.  She presents with her CGM device showing 65% time range, 28% level 1 hyperglycemia, 7% level 2 hyperglycemia.  No hypoglycemia.  Her point-of-care A1c is 7.1%.     -her  diabetes is complicated by PAD, CKD, neuropathy and patient remains at a high risk for more acute and chronic complications of diabetes which include CAD, CVA, CKD, retinopathy, and neuropathy. These are all discussed in detail with the patient.  - I have counseled the patient on  diet management  by adopting a carbohydrate restricted/protein rich diet.  - she acknowledges that there is a room for improvement in her food and drink choices. -  Suggestion is made for her to avoid simple carbohydrates  from her diet including Cakes, Sweet Desserts, Ice Cream, Soda (diet and regular), Sweet Tea, Candies, Chips, Cookies, Store Bought Juices, Alcohol in Excess of  1-2 drinks a day, Artificial Sweeteners,  Coffee Creamer, and "Sugar-free" Products, Lemonade. This will help patient to have more stable blood glucose profile and potentially avoid unintended weight gain.  - I encouraged the patient to switch to  unprocessed or minimally processed complex starch and increased protein intake (animal or plant source), fruits, and vegetables.  - Patient is advised to stick to a routine mealtimes to eat 3 meals  a day and avoid unnecessary snacks ( to snack only to correct hypoglycemia).   - I have approached patient with the following individualized plan to manage diabetes and patient agrees:    -Her husband, Ludwig Clarks , continues to offer to help. -She is advised to continue Basaglar 8  units every morning with breakfast  , advised to use her CGM to monitor blood glucose at all times.   - First priority in this patient will be to avoid hypoglycemia.   -Patient is encouraged to call clinic for blood glucose levels less than 70 or above 200 mg /dl.  -Patient is not a candidate for MTF, Incretin therapy.  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    - Patient specific target  A1c;  LDL, HDL, Triglycerides,  were discussed in detail.  2) BP/HTN:  Her blood pressure controlled to near target levels.   She is advised to continue her current blood pressure medications including amlodipine 5 mg p.o. daily, carvedilol 12.5 mg p.o. twice daily.  She  has documented allergy to ARB.  3) Lipids/HPL: Her recent lipid panel continues to show improvement in her LDL at 58.she will continue to benefit from statin intervention.  She is advised to continue atorvastatin 10 mg p.o. nightly.   Side effects and precautions discussed with her.     4)  Weight/Diet: Her  BMI is 27.3.  She is not a candidate for major weight loss.     CDE Consult has been initiated, she has limited ability to exercise.  5)  Hypothyroidism:  - Her current thyroid function tests are consistent with slight over-replacement.  I discussed and lowered her levothyroxine to 50 mcg p.o. daily before breakfast.     - We discussed about the correct intake of her thyroid hormone, on empty stomach at fasting, with water, separated by at least 30 minutes from breakfast and other medications,  and separated by more than 4 hours from calcium, iron, multivitamins, acid reflux medications (PPIs). -Patient is made aware of the fact that thyroid hormone replacement is needed for life, dose to be adjusted by periodic monitoring of thyroid function tests.   5) Chronic Care/Health Maintenance:  -Patient is  on ACEI/ARB and Statin medications and encouraged to continue to follow up with Ophthalmology, Podiatrist at least yearly or according to recommendations, and advised to   stay away from smoking. I have recommended yearly flu vaccine and pneumonia vaccination at least every 5 years;  and  sleep for at least 7 hours a day.  - I advised patient to maintain close follow up with [name], DO for primary care needs.  I spent 41 minutes in the care  of the patient today including review of labs from [place], Lipids, Thyroid Function, Hematology (current and previous including abstractions from other facilities); face-to-face time discussing  her blood glucose readings/logs, discussing hypoglycemia and hyperglycemia episodes and symptoms, medications doses, her options of short and long term treatment based on the latest standards of care / guidelines;  discussion about incorporating lifestyle medicine;  and documenting the encounter.    Please refer to Patient Instructions for Blood Glucose Monitoring and Insulin/Medications Dosing Guide"  in media tab for additional information. Please  also refer to  " Patient Self Inventory" in the Media  tab for reviewed elements of pertinent patient history.  [name] participated in the discussions, expressed understanding, and voiced agreement with the above plans.  All questions were answered to her satisfaction. she is encouraged to contact clinic should she have any questions or concerns prior to her return visit.    Follow up plan: - Return in about 6 months (around 04/22/2022) for Bring Meter/CGM Device/Logs- A1c in Office.  Glade Lloyd, MD Phone: [phone]  Fax: [phone]  -  This note was partially dictated with voice recognition software. Similar sounding words can be transcribed inadequately or may not  be corrected upon review.  10/20/2021, 5:30 PM

## 2021-10-27 DIAGNOSIS — M79676 Pain in unspecified toe(s): Secondary | ICD-10-CM | POA: Diagnosis not present

## 2021-10-27 DIAGNOSIS — L03031 Cellulitis of right toe: Secondary | ICD-10-CM | POA: Diagnosis not present

## 2021-10-28 ENCOUNTER — Encounter (INDEPENDENT_AMBULATORY_CARE_PROVIDER_SITE_OTHER): Payer: Medicare Other | Admitting: Ophthalmology

## 2021-11-10 DIAGNOSIS — L03031 Cellulitis of right toe: Secondary | ICD-10-CM | POA: Diagnosis not present

## 2021-11-11 ENCOUNTER — Ambulatory Visit (INDEPENDENT_AMBULATORY_CARE_PROVIDER_SITE_OTHER): Payer: Medicare Other | Admitting: Ophthalmology

## 2021-11-11 ENCOUNTER — Encounter (INDEPENDENT_AMBULATORY_CARE_PROVIDER_SITE_OTHER): Payer: Self-pay | Admitting: Ophthalmology

## 2021-11-11 ENCOUNTER — Encounter (INDEPENDENT_AMBULATORY_CARE_PROVIDER_SITE_OTHER): Payer: Medicare Other | Admitting: Ophthalmology

## 2021-11-11 DIAGNOSIS — H35351 Cystoid macular degeneration, right eye: Secondary | ICD-10-CM

## 2021-11-11 DIAGNOSIS — Z794 Long term (current) use of insulin: Secondary | ICD-10-CM | POA: Diagnosis not present

## 2021-11-11 DIAGNOSIS — E113553 Type 2 diabetes mellitus with stable proliferative diabetic retinopathy, bilateral: Secondary | ICD-10-CM | POA: Diagnosis not present

## 2021-11-11 DIAGNOSIS — T8522XA Displacement of intraocular lens, initial encounter: Secondary | ICD-10-CM

## 2021-11-11 DIAGNOSIS — H353134 Nonexudative age-related macular degeneration, bilateral, advanced atrophic with subfoveal involvement: Secondary | ICD-10-CM | POA: Diagnosis not present

## 2021-11-11 MED ORDER — KETOROLAC TROMETHAMINE 0.5 % OP SOLN: 1.0000 [drp] | Freq: Two times a day (BID) | OPHTHALMIC | 0 refills | Status: DC

## 2021-11-11 NOTE — Progress Notes (Addendum)
11/11/2021     CHIEF COMPLAINT Patient presents for  Chief Complaint  Patient presents with   Cystoid Macular Edema      HISTORY OF PRESENT ILLNESS: [name] is a 76 y.o. female who presents to the clinic today for:   HPI   9 mos fu OU OCT. Patient states vision is stable and unchanged since last visit. Denies any new floaters or FOL. Patient states she uses a blue top drop twice a day OD, states she needs a refill. LBS: 123 this morning. Last edited by Laurin Coder on 11/11/2021  9:25 AM.      Referring physician: [name], DO [place],  [place] [id number]  HISTORICAL INFORMATION:   Selected notes from the MEDICAL RECORD NUMBER    Lab Results  Component Value Date   HGBA1C 7.1 (H) 09/29/2021     CURRENT MEDICATIONS: Current Outpatient Medications (Ophthalmic Drugs)  Medication Sig   ketorolac (ACULAR) 0.5 % ophthalmic solution Place 1 drop into the right eye 2 (two) times daily.   prednisoLONE acetate (PRED FORTE) 1 % ophthalmic suspension Place 1 drop into the right eye 4 (four) times daily.   No current facility-administered medications for this visit. (Ophthalmic Drugs)   Current Outpatient Medications (Other)  Medication Sig   amLODipine (NORVASC) 5 MG tablet Take 1 tablet (5 mg total) by mouth daily.   atorvastatin (LIPITOR) 10 MG tablet Take 1 tablet (10 mg total) by mouth daily.   carvedilol (COREG) 12.5 MG tablet TAKE 1 TABLET TWICE DAILY WITH A MEAL   Cholecalciferol 25 MCG (1000 UT) capsule Take 1 capsule (1,000 Units total) by mouth daily.   colchicine 0.6 MG tablet Take 0.6 mg by mouth daily as needed.   Febuxostat 80 MG TABS Take by mouth.   fluticasone (FLONASE) 50 MCG/ACT nasal spray Place 2 sprays into both nostrils daily. Use 2 sprays in each nostril daily   furosemide (LASIX) 40 MG tablet Take 1 tablet twice weekly   Insulin Glargine (BASAGLAR KWIKPEN) 100 UNIT/ML Inject 8 Units into the skin daily.   Lancets  Misc. MISC Use as directed to check blood sugar up to 4x daily. E11.65   levocetirizine (XYZAL) 5 MG tablet Take 1 tablet (5 mg total) by mouth at bedtime as needed for allergies (allergies/ ear fullness).   levothyroxine (SYNTHROID) 50 MCG tablet Take 1.5 tablets (75 mcg total) by mouth daily before breakfast.   meclizine (ANTIVERT) 25 MG tablet Take 0.5-1 tablets (12.5-25 mg total) by mouth 3 (three) times daily as needed for dizziness.   pantoprazole (PROTONIX) 20 MG tablet Take 1 tablet (20 mg total) by mouth daily.   ULTICARE MICRO PEN NEEDLES 32G X 4 MM MISC TWICE DAILY   No current facility-administered medications for this visit. (Other)      REVIEW OF SYSTEMS:    ALLERGIES Allergies  Allergen Reactions   Aspirin Nausea And Vomiting   Ciprofloxacin Other (See Comments)    Upset Stomach   Codeine Nausea And Vomiting    Makes me sick    Losartan     Hyperkalemia    Micardis [Telmisartan] Other (See Comments)    unknown   Nexium [Esomeprazole Magnesium] Other (See Comments)    Causes internal bleeding   Niaspan [Niacin Er] Other (See Comments)    Reaction is unknown   Onglyza [Saxagliptin] Other (See Comments)    Reaction is unknown   Other     No otc  pain medications   Rofecoxib Other (See Comments)    unknown   Simvastatin    Tequin [Gatifloxacin] Other (See Comments)    Reaction is unknown   Welchol [Colesevelam Hcl]    Amoxicillin Nausea And Vomiting and Rash    Has patient had a PCN reaction causing immediate rash, facial/tongue/throat swelling, SOB or lightheadedness with hypotension: Yes Has patient had a PCN reaction causing severe rash involving mucus membranes or skin necrosis: no  Has patient had a PCN reaction that required hospitalization No Has patient had a PCN reaction occurring within the last 10 years: No If all of the above answers are "NO", then may proceed with Cephalosporin use.     PAST MEDICAL HISTORY Past Medical History:  Diagnosis  Date   Anal fistula    Anemia in chronic renal disease    Aranesp injection --  when Hg <11, last injection 12-24-14.   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    CKD (chronic kidney disease), stage III [facility])    nephrologist--  dr Mercy Moore-- LOV  [id number]   Complication of anesthesia    post-op confusion    Constipation    Coronary artery disease    Diabetic gastroparesis ([place])    Diabetic retinopathy ([place])    bilateral --  monitored by dr [name]   Diverticulosis of colon    GAD (generalized anxiety disorder)    GERD (gastroesophageal reflux disease)    History of colon polyps    benign   History of esophagitis    History of GI bleed    upper 2009  due to esophagitis  &  2002  due to Mallory-Weiss tear   History of hyperkalemia    pt had previously been canceled twice dos due to elevated K+, 07-29-2016 last date cancelled -- pt visited pcp same day treated w/ kayexelate and K+ came down, pt brought all her medication's in to pcp office and found pt was taking losartan that had been discontinued due to ckd, pcp stated this was cause of elevated K+   History of Mallory-Weiss syndrome    12/ 2002--  resolved   History of rectal abscess    12-29-2004  bedside I & D   Hyperlipidemia    Hypertension    Hypothyroidism    LAFB (left anterior fascicular block)    Right bundle branch block    Sacral decubitus ulcer    since 2014- 01-02-15 remains with wound" gauze dressing changes daily.   Type II diabetes mellitus ([place])    Past Surgical History:  Procedure Laterality Date   APPLICATION OF A-CELL OF EXTREMITY N/A 04/07/2015   Procedure: A CELL PLACMENT;  Surgeon: Loel Lofty Dillingham, DO;  Location: [place];  Service: Plastics;  Laterality: N/A;   CARDIOVASCULAR STRESS TEST  12-30-2004   normal perfusion study/  no ischemia or infartion/  normal LV wall function and wall motion , ef 66%   CATARACT EXTRACTION W/ INTRAOCULAR LENS  IMPLANT, BILATERAL  1995   COLONOSCOPY  2008   w/Dr.Brodie    COLONOSCOPY W/ POLYPECTOMY  last one 2008   COMPRESSION HIP SCREW Right 05/18/2014  Procedure: COMPRESSION HIP;  Surgeon: Carole Civil, MD;  [facility]: [facility];  Service: Orthopedics;  Laterality: Right;   ESOPHAGOGASTRODUODENOSCOPY  last one 01-09-2011   EVALUATION UNDER ANESTHESIA WITH FISTULECTOMY N/A 01/06/2015   Procedure: EXAM UNDER ANESTHESIA , placement of seton;  Surgeon: Jackolyn Confer, MD;  [facility];  Service: General;  Laterality: N/A;   I & D EXTREMITY N/A 04/07/2015   Procedure: IRRIGATION AND DEBRIDEMENT ISCHIAL ULCER;  Surgeon: Loel Lofty Dillingham, DO;  Location: [facility];  Service: Plastics;  Laterality: N/A;   INCISION AND DRAINAGE OF WOUND N/A 09/30/2014   Procedure: IRRIGATION AND DEBRIDEMENT SACRAL WOUND, EXCISION OF PERIRECTAL TRACT WITH PLACEMENT OF ACCELL;  Surgeon: Theodoro Kos, DO;  Location: [place];  Service: Plastics;  Laterality: N/A;   LIGATION OF INTERNAL FISTULA TRACT N/A 10/22/2016   Procedure: LIGATION OF INTERNAL FISTULA TRACT;  Surgeon: Leighton Ruff, MD;  Location: [facility];  Service: General;  Laterality: N/A;   ORIF FEMUR FRACTURE Left 10/09/2012   Procedure: OPEN REDUCTION INTERNAL FIXATION (ORIF) DISTAL FEMUR FRACTURE;  Surgeon: Rozanna Box, MD;  Location: [facility];  Service: Orthopedics;  Laterality: Left;   RETINOPATHY SURGERY Bilateral 1980's?   TRANSTHORACIC ECHOCARDIOGRAM  02-18-2011   mild LVH,  ef 55-60%,  grade I diastolic dysfunction/  mild TR/  RV systolic pressure increased consistant with moderate pulmonary hypertension    FAMILY HISTORY Family History  Problem Relation Age  of Onset   Colon cancer Father 37   Prostate cancer Father    Diabetes Father    Coronary artery disease Mother 43   Heart disease Mother    Diabetes Mother    Coronary artery disease Sister 93   Diabetes Sister    Heart disease Sister    Diabetes Sister    Diabetes Sister    Diabetes Sister    Diabetes Sister    Diabetes Sister    Diabetes Maternal Grandmother    Esophageal cancer Neg Hx    Stomach cancer Neg Hx    Rectal cancer Neg Hx     SOCIAL HISTORY Social History   Tobacco Use   Smoking status: Never   Smokeless tobacco: Never  Vaping Use   Vaping Use: Never used  Substance Use Topics   Alcohol use: No   Drug use: No         OPHTHALMIC EXAM:  Base Eye Exam     Visual Acuity (ETDRS)       Right Left   Dist [place] 20/50 -1+2 20/100 -2   Dist ph [place] NI NI         Tonometry (Tonopen, 9:31 AM)       Right Left   Pressure 10 13         Pupils       Pupils Dark Light React APD   Right PERRL 3 3 Minimal None   Left PERRL 2 2 Minimal None         Visual Fields (Counting fingers)       Left Right   Restrictions Partial outer superior temporal, inferior temporal, superior nasal, inferior nasal deficiencies Partial outer superior temporal, inferior temporal, superior nasal, inferior nasal deficiencies         Extraocular Movement       Right Left    Full Full         Neuro/Psych     Oriented x3: Yes   Mood/Affect: Normal  Dilation     Both eyes: 1.0% Mydriacyl, 2.5% Phenylephrine @ 9:31 AM           Slit Lamp and Fundus Exam     External Exam       Right Left   External Normal Normal         Slit Lamp Exam       Right Left   Lids/Lashes Normal Normal   Conjunctiva/Sclera White and quiet, no exposed haptic bulbs. White and quiet, no exposed haptic bulbs.   Cornea Clear Clear   Anterior Chamber Deep and quiet Deep and quiet   Iris Round and reactive, PI inferonasal patent Round and reactive   Lens  Centered posterior chamber intraocular lens, scleral tunnel haptic fixation, haptic tips well covered, no erosion, no iris chafe Centered posterior chamber intraocular lens   Anterior Vitreous Normal Normal         Fundus Exam       Right Left   Posterior Vitreous Clear, vitrectomized Clear, vitrectomized   Disc Normal Normal   C/D Ratio 0.5 0.5   Macula Geographic atrophy, good focal Geographic atrophy, good focal   Vessels PDR quiescent PDR quiescent   Periphery PRP 360.  attached, lesion creep over decades,  enlarging laser  scars PRP 360.  attached, lesion creep over decades,  enlarging laser  scars            IMAGING AND PROCEDURES  Imaging and Procedures for 11/11/21  OCT, Retina - OU - Both Eyes       Right Eye Quality was good. Scan locations included subfoveal. Central Foveal Thickness: 280. Progression has been stable. Findings include abnormal foveal contour.   Left Eye Quality was good. Scan locations included subfoveal. Central Foveal Thickness: 272. Progression has been stable. Findings include abnormal foveal contour.   Notes No residual CME.  Diffuse macular atrophy from previous active diabetic retinopathy             ASSESSMENT/PLAN:  Controlled type 2 diabetes mellitus with stable proliferative retinopathy of both eyes, with long-term current use of insulin ([place]) OU stableThe nature of regressed proliferative diabetic retinopathy was discussed with the patient. The patient was advised to maintain good glucose, blood pressure, monitor kidney function and serum lipid control as advised by personal physician. Rare risk for reactivation of progression exist with untreated severe anemia, untreated renal failure, untreated heart failure, and smoking. Complete avoidance of smoking was recommended. The chance of recurrent proliferative diabetic retinopathy was discussed as well as the chance of vitreous hemorrhage for which further treatments may be  necessary.   Explained to the patient that the quiescent  proliferative diabetic retinopathy disease is unlikely to ever worsen.  Worsening factors would include however severe anemia, hypertension out-of-control or impending renal failure.  Dislocated intraocular lens, initial encounter OD IOL central, after surgical removal of dislocated IOL, and placement of scleral tunnel fixated Zeiss CT Lucia three-piece IOL.  Looks great.  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 Cystoid macular edema, right eye  H35.351 OCT, Retina - OU - Both Eyes    2. Controlled type 2 diabetes mellitus with stable proliferative retinopathy of both eyes, with long-term current use of insulin ([place])  M07.6808    Z79.4     3. Dislocated intraocular lens, initial encounter  T85.[place]  4. 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  2.  OD with minor CME in the past, controlled and improved on ketorolac 1 drop right eye twice daily.  Continue long-term and indefinitely  3.  OU with quiescent PDR no active disease stable acuity  Ophthalmic Meds Ordered this visit:  Meds ordered this encounter  Medications   ketorolac (ACULAR) 0.5 % ophthalmic solution    Sig: Place 1 drop into the right eye 2 (two) times daily.    Dispense:  5 mL    Refill:  0       Return in about 9 months (around 08/12/2022) for dilate, OS, VABYSMO OCT.  Patient Instructions  Patient to continue on topical ketorolac 1 drop right eye twice daily in order to treat and prevent recurrence macular edema swelling   Explained the diagnoses, plan, and follow up with the patient and they expressed  understanding.  Patient expressed understanding of the importance of proper follow up care.   Clent Demark Geanie Pacifico M.D. Diseases & Surgery of the Retina and Vitreous Retina & Diabetic [place] 11/11/21     Abbreviations: M myopia (nearsighted); A astigmatism; H hyperopia (farsighted); P presbyopia; Mrx spectacle prescription;  CTL contact lenses; OD right eye; OS left eye; OU both eyes  XT exotropia; ET esotropia; PEK punctate epithelial keratitis; PEE punctate epithelial erosions; DES dry eye syndrome; MGD meibomian gland dysfunction; ATs artificial tears; PFAT's preservative free artificial tears; [place] nuclear sclerotic cataract; PSC posterior subcapsular cataract; ERM epi-retinal membrane; PVD posterior vitreous detachment; RD retinal detachment; DM diabetes mellitus; DR diabetic retinopathy; NPDR non-proliferative diabetic retinopathy; PDR proliferative diabetic retinopathy; CSME clinically significant macular edema; DME diabetic macular edema; dbh dot blot hemorrhages; CWS cotton wool spot; POAG primary open angle glaucoma; C/D cup-to-disc ratio; HVF humphrey visual field; GVF goldmann visual field; OCT optical coherence tomography; IOP intraocular pressure; BRVO Branch retinal vein occlusion; CRVO central retinal vein occlusion; CRAO central retinal artery occlusion; BRAO branch retinal artery occlusion; RT retinal tear; SB scleral buckle; PPV pars plana vitrectomy; VH Vitreous hemorrhage; PRP panretinal laser photocoagulation; IVK intravitreal kenalog; VMT vitreomacular traction; MH Macular hole;  NVD neovascularization of the disc; NVE neovascularization elsewhere; AREDS age related eye disease study; ARMD age related macular degeneration; POAG primary open angle glaucoma; EBMD epithelial/anterior basement membrane dystrophy; ACIOL anterior chamber intraocular lens; IOL intraocular lens; PCIOL posterior chamber intraocular lens; Phaco/IOL phacoemulsification with intraocular lens placement; PRK  photorefractive keratectomy; LASIK laser assisted in situ keratomileusis; HTN hypertension; DM diabetes mellitus; COPD chronic obstructive pulmonary disease   11/11/2021     CHIEF COMPLAINT Patient presents for  Chief Complaint  Patient presents with   Cystoid Macular Edema      HISTORY OF PRESENT ILLNESS: [name] is a 76 y.o. female who presents to the clinic today for:   HPI   9 mos fu OU OCT. Patient states vision is stable and unchanged since last visit. Denies any new floaters or FOL. Patient states she uses a blue top drop twice a day OD, states she needs a refill. LBS: 123 this morning. Last edited by Laurin Coder on 11/11/2021  9:25 AM.      Referring physician: [name], DO [place],  [place] [id number]  HISTORICAL INFORMATION:   Selected notes from the [place]  Lab Results  Component Value Date   HGBA1C 7.1 (H) 09/29/2021     CURRENT MEDICATIONS: Current Outpatient Medications (Ophthalmic Drugs)  Medication Sig   ketorolac (ACULAR) 0.5 % ophthalmic solution Place 1 drop into the right eye 2 (two) times daily.   prednisoLONE acetate (PRED FORTE) 1 % ophthalmic suspension Place 1 drop into the right eye 4 (four) times daily.   No current facility-administered medications for this visit. (Ophthalmic Drugs)   Current Outpatient Medications (Other)  Medication Sig   amLODipine (NORVASC) 5 MG tablet Take 1 tablet (5 mg total) by mouth daily.   atorvastatin (LIPITOR) 10 MG tablet Take 1 tablet (10 mg total) by mouth daily.   carvedilol (COREG) 12.5 MG tablet TAKE 1 TABLET TWICE DAILY WITH A MEAL   Cholecalciferol 25 MCG (1000 UT) capsule Take 1 capsule (1,000 Units total) by mouth daily.   colchicine 0.6 MG tablet Take 0.6 mg by mouth daily as needed.   Febuxostat 80 MG TABS Take by mouth.   fluticasone (FLONASE) 50 MCG/ACT nasal spray Place 2 sprays into both nostrils daily. Use 2 sprays in each nostril daily    furosemide (LASIX) 40 MG tablet Take 1 tablet twice weekly   Insulin Glargine (BASAGLAR KWIKPEN) 100 UNIT/ML Inject 8 Units into the skin daily.   Lancets Misc. MISC Use as directed to check blood sugar up to 4x daily. E11.65   levocetirizine (XYZAL) 5 MG tablet Take 1 tablet (5 mg total) by mouth at bedtime as needed for allergies (allergies/ ear fullness).   levothyroxine (SYNTHROID) 50 MCG tablet Take 1.5 tablets (75 mcg total) by mouth daily before breakfast.   meclizine (ANTIVERT) 25 MG tablet Take 0.5-1 tablets (12.5-25 mg total) by mouth 3 (three) times daily as needed for dizziness.   pantoprazole (PROTONIX) 20 MG tablet Take 1 tablet (20 mg total) by mouth daily.   ULTICARE MICRO PEN NEEDLES 32G X 4 MM MISC TWICE DAILY   No current facility-administered medications for this visit. (Other)      REVIEW OF SYSTEMS:    ALLERGIES Allergies  Allergen Reactions   Aspirin Nausea And Vomiting   Ciprofloxacin Other (See Comments)    Upset Stomach   Codeine Nausea And Vomiting    Makes me sick    Losartan     Hyperkalemia    Micardis [Telmisartan] Other (See Comments)    unknown   Nexium [Esomeprazole Magnesium] Other (See Comments)    Causes internal bleeding   Niaspan [Niacin Er] Other (See Comments)    Reaction is unknown   Onglyza [Saxagliptin] Other (See Comments)    Reaction is unknown   Other     No otc pain medications   Rofecoxib Other (See Comments)    unknown   Simvastatin    Tequin [Gatifloxacin] Other (See Comments)    Reaction is unknown   Welchol [Colesevelam Hcl]    Amoxicillin Nausea And Vomiting and Rash    Has patient had a PCN reaction causing immediate rash, facial/tongue/throat swelling, SOB or lightheadedness with hypotension: Yes Has patient had a PCN reaction causing severe rash involving mucus membranes or skin necrosis: no  Has patient had a PCN reaction that required hospitalization No Has patient had a PCN reaction occurring within the last  10 years: No If all of the above answers are "NO", then may proceed with Cephalosporin use.     PAST MEDICAL HISTORY Past Medical History:  Diagnosis Date   Anal fistula    Anemia in  chronic renal disease    Aranesp injection --  when Hg <11, last injection 12-24-14.   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    CKD (chronic kidney disease), stage III [facility])    nephrologist--  dr Mercy Moore-- LOV  [phone]   Complication of anesthesia    post-op confusion    Constipation    Coronary artery disease    Diabetic gastroparesis ([place])    Diabetic retinopathy ([place])    bilateral --  monitored by dr [name]   Diverticulosis of colon    GAD (generalized anxiety disorder)    GERD (gastroesophageal reflux disease)    History of colon polyps    benign   History of esophagitis    History of GI bleed    upper 2009  due to esophagitis  &  2002  due to Mallory-Weiss tear   History of hyperkalemia    pt had previously been canceled twice dos due to elevated K+, 07-29-2016 last date cancelled -- pt visited pcp same day treated w/ kayexelate and K+ came down, pt brought all her medication's in to pcp office and found pt was taking losartan that had been discontinued due to ckd, pcp stated this was cause of elevated K+   History of Mallory-Weiss syndrome    12/ 2002--  resolved   History of rectal abscess    12-29-2004  bedside I & D   Hyperlipidemia    Hypertension    Hypothyroidism    LAFB (left anterior fascicular block)    Right bundle branch block    Sacral decubitus ulcer    since 2014- 01-02-15 remains with wound" gauze dressing changes daily.   Type II diabetes mellitus ([place])     Past Surgical History:  Procedure Laterality Date   APPLICATION OF A-CELL OF EXTREMITY N/A 04/07/2015   Procedure: A CELL PLACMENT;  Surgeon: Loel Lofty Dillingham, DO;  Location: [place];  Service: Plastics;  Laterality: N/A;   CARDIOVASCULAR STRESS TEST  12-30-2004   normal perfusion study/  no ischemia or infartion/  normal LV wall function and wall motion , ef 66%   CATARACT EXTRACTION W/ INTRAOCULAR LENS  IMPLANT, BILATERAL  1995   COLONOSCOPY  2008   w/Dr.Brodie    COLONOSCOPY W/ POLYPECTOMY  last one 2008   COMPRESSION HIP SCREW Right 05/18/2014   Procedure: COMPRESSION HIP;  Surgeon: Carole Civil, MD;  [facility];  Service: Orthopedics;  Laterality: Right;   ESOPHAGOGASTRODUODENOSCOPY  last one 01-09-2011   EVALUATION UNDER ANESTHESIA WITH FISTULECTOMY N/A 01/06/2015   Procedure: EXAM UNDER ANESTHESIA , placement of seton;  Surgeon: Jackolyn Confer, MD;  [facility];  Service: General;  Laterality: N/A;   I & D EXTREMITY N/A 04/07/2015   Procedure: IRRIGATION AND DEBRIDEMENT ISCHIAL ULCER;  Surgeon: Loel Lofty Dillingham, DO;  Location: [place];  Service: Plastics;  Laterality: N/A;   INCISION AND DRAINAGE OF WOUND N/A 09/30/2014   Procedure: IRRIGATION AND DEBRIDEMENT SACRAL WOUND, EXCISION OF PERIRECTAL TRACT WITH PLACEMENT OF ACCELL;  Surgeon: Theodoro Kos, DO;  Location: [place];  Service: Plastics;  Laterality: N/A;   LIGATION OF INTERNAL FISTULA  TRACT N/A 10/22/2016   Procedure: LIGATION OF INTERNAL FISTULA TRACT;  Surgeon: Leighton Ruff, MD;  Location: [facility];  Service: General;  Laterality: N/A;   ORIF FEMUR FRACTURE Left 10/09/2012   Procedure: OPEN REDUCTION INTERNAL FIXATION (ORIF) DISTAL FEMUR FRACTURE;  Surgeon: Rozanna Box, MD;  Location: [place];  Service: Orthopedics;  Laterality: Left;   RETINOPATHY SURGERY Bilateral 1980's?   TRANSTHORACIC ECHOCARDIOGRAM  02-18-2011   mild LVH,  ef 55-60%,  grade I diastolic  dysfunction/  mild TR/  RV systolic pressure increased consistant with moderate pulmonary hypertension    FAMILY HISTORY Family History  Problem Relation Age of Onset   Colon cancer Father 42   Prostate cancer Father    Diabetes Father    Coronary artery disease Mother 21   Heart disease Mother    Diabetes Mother    Coronary artery disease Sister 36   Diabetes Sister    Heart disease Sister    Diabetes Sister    Diabetes Sister    Diabetes Sister    Diabetes Sister    Diabetes Sister    Diabetes Maternal Grandmother    Esophageal cancer Neg Hx    Stomach cancer Neg Hx    Rectal cancer Neg Hx     SOCIAL HISTORY Social History   Tobacco Use   Smoking status: Never   Smokeless tobacco: Never  Vaping Use   Vaping Use: Never used  Substance Use Topics   Alcohol use: No   Drug use: No         OPHTHALMIC EXAM:  Base Eye Exam     Visual Acuity (ETDRS)       Right Left   Dist [place] 20/50 -1+2 20/100 -2   Dist ph [place] NI NI         Tonometry (Tonopen, 9:31 AM)       Right Left   Pressure 10 13         Pupils       Pupils Dark Light React APD   Right PERRL 3 3 Minimal None   Left PERRL 2 2 Minimal None         Visual Fields (Counting fingers)       Left Right   Restrictions Partial outer superior temporal, inferior temporal, superior nasal, inferior nasal deficiencies Partial outer superior temporal, inferior temporal, superior nasal, inferior nasal deficiencies         Extraocular Movement       Right Left    Full Full         Neuro/Psych     Oriented x3: Yes   Mood/Affect: Normal         Dilation     Both eyes: 1.0% Mydriacyl, 2.5% Phenylephrine @ 9:31 AM           Slit Lamp and Fundus Exam     External Exam       Right Left   External Normal Normal         Slit Lamp Exam       Right Left   Lids/Lashes Normal Normal   Conjunctiva/Sclera White and quiet, no exposed haptic bulbs. White and quiet, no exposed haptic  bulbs.   Cornea Clear Clear   Anterior Chamber Deep and quiet Deep and quiet   Iris Round and reactive, PI inferonasal patent Round and reactive   Lens Centered posterior chamber intraocular lens, scleral tunnel haptic fixation, haptic tips well covered, no erosion, no iris  chafe Centered posterior chamber intraocular lens   Anterior Vitreous Normal Normal         Fundus Exam       Right Left   Posterior Vitreous Clear, vitrectomized Clear, vitrectomized   Disc Normal Normal   C/D Ratio 0.5 0.5   Macula Geographic atrophy, good focal Geographic atrophy, good focal   Vessels PDR quiescent PDR quiescent   Periphery PRP 360.  attached, lesion creep over decades,  enlarging laser  scars PRP 360.  attached, lesion creep over decades,  enlarging laser  scars            IMAGING AND PROCEDURES  Imaging and Procedures for 11/11/21  OCT, Retina - OU - Both Eyes       Right Eye Quality was good. Scan locations included subfoveal. Central Foveal Thickness: 280. Progression has been stable. Findings include abnormal foveal contour.   Left Eye Quality was good. Scan locations included subfoveal. Central Foveal Thickness: 272. Progression has been stable. Findings include abnormal foveal contour.   Notes No residual CME.  Diffuse macular atrophy from previous active diabetic retinopathy             ASSESSMENT/PLAN:  Controlled type 2 diabetes mellitus with stable proliferative retinopathy of both eyes, with long-term current use of insulin ([place]) OU stableThe nature of regressed proliferative diabetic retinopathy was discussed with the patient. The patient was advised to maintain good glucose, blood pressure, monitor kidney function and serum lipid control as advised by personal physician. Rare risk for reactivation of progression exist with untreated severe anemia, untreated renal failure, untreated heart failure, and smoking. Complete avoidance of smoking was recommended. The  chance of recurrent proliferative diabetic retinopathy was discussed as well as the chance of vitreous hemorrhage for which further treatments may be necessary.   Explained to the patient that the quiescent  proliferative diabetic retinopathy disease is unlikely to ever worsen.  Worsening factors would include however severe anemia, hypertension out-of-control or impending renal failure.  Dislocated intraocular lens, initial encounter OD IOL central, after surgical removal of dislocated IOL, and placement of scleral tunnel fixated Zeiss CT Lucia three-piece IOL.  Looks great.  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 Cystoid macular edema, right eye  H35.351 OCT, Retina - OU - Both Eyes    2. Controlled type 2 diabetes mellitus with stable proliferative retinopathy of both eyes, with long-term current use of insulin ([place])  Z61.0960    Z79.4     3. Dislocated intraocular lens, initial encounter  T85.22XA     4. 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  The with history of CME, now controlled on ketorolac twice daily topically after restoration of pseudophakia with scleral tunnel fixated PCIOL in the absence of capsule support and prior IOL dislocation  2.  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  OU, quiescent PDR, stable.  Ophthalmic Meds Ordered this visit:  Meds ordered this encounter  Medications   ketorolac (ACULAR) 0.5 % ophthalmic solution    Sig: Place 1 drop into the right eye 2 (two) times daily.    Dispense:  5 mL    Refill:  0       Return in about 9 months (around 08/12/2022) for  dilate, OS, VABYSMO OCT.  Patient Instructions  Patient to continue on topical ketorolac 1 drop right eye  twice daily in order to treat and prevent recurrence macular edema swelling   Explained the diagnoses, plan, and follow up with the patient and they expressed understanding.  Patient expressed understanding of the importance of proper follow up care.   Clent Demark Sevin Langenbach M.D. Diseases & Surgery of the Retina and Vitreous Retina & Diabetic [place] 11/11/21     Abbreviations: M myopia (nearsighted); A astigmatism; H hyperopia (farsighted); P presbyopia; Mrx spectacle prescription;  CTL contact lenses; OD right eye; OS left eye; OU both eyes  XT exotropia; ET esotropia; PEK punctate epithelial keratitis; PEE punctate epithelial erosions; DES dry eye syndrome; MGD meibomian gland dysfunction; ATs artificial tears; PFAT's preservative free artificial tears; [place] nuclear sclerotic cataract; PSC posterior subcapsular cataract; ERM epi-retinal membrane; PVD posterior vitreous detachment; RD retinal detachment; DM diabetes mellitus; DR diabetic retinopathy; NPDR non-proliferative diabetic retinopathy; PDR proliferative diabetic retinopathy; CSME clinically significant macular edema; DME diabetic macular edema; dbh dot blot hemorrhages; CWS cotton wool spot; POAG primary open angle glaucoma; C/D cup-to-disc ratio; HVF humphrey visual field; GVF goldmann visual field; OCT optical coherence tomography; IOP intraocular pressure; BRVO Branch retinal vein occlusion; CRVO central retinal vein occlusion; CRAO central retinal artery occlusion; BRAO branch retinal artery occlusion; RT retinal tear; SB scleral buckle; PPV pars plana vitrectomy; VH Vitreous hemorrhage; PRP panretinal laser photocoagulation; IVK intravitreal kenalog; VMT vitreomacular traction; MH Macular hole;  NVD neovascularization of the disc; NVE neovascularization elsewhere; AREDS age related eye disease study; ARMD age related macular degeneration; POAG primary open angle glaucoma; EBMD epithelial/anterior basement membrane dystrophy; ACIOL anterior  chamber intraocular lens; IOL intraocular lens; PCIOL posterior chamber intraocular lens; Phaco/IOL phacoemulsification with intraocular lens placement; [place] photorefractive keratectomy; LASIK laser assisted in situ keratomileusis; HTN hypertension; DM diabetes mellitus; COPD chronic obstructive pulmonary disease

## 2021-11-11 NOTE — Patient Instructions (Signed)
Patient to continue on topical ketorolac 1 drop right eye twice daily in order to treat and prevent recurrence macular edema swelling

## 2021-11-11 NOTE — Assessment & Plan Note (Signed)
Age-related associated, also lesion increased from previous PRP that has migrated posteriorly with atrophy

## 2021-11-11 NOTE — Assessment & Plan Note (Signed)
OU stableThe nature of regressed proliferative diabetic retinopathy was discussed with the patient. The patient was advised to maintain good glucose, blood pressure, monitor kidney function and serum lipid control as advised by personal physician. Rare risk for reactivation of progression exist with untreated severe anemia, untreated renal failure, untreated heart failure, and smoking. Complete avoidance of smoking was recommended. The chance of recurrent proliferative diabetic retinopathy was discussed as well as the chance of vitreous hemorrhage for which further treatments may be necessary.   Explained to the patient that the quiescent  proliferative diabetic retinopathy disease is unlikely to ever worsen.  Worsening factors would include however severe anemia, hypertension out-of-control or impending renal failure.

## 2021-11-11 NOTE — Assessment & Plan Note (Signed)
OD IOL central, after surgical removal of dislocated IOL, and placement of scleral tunnel fixated Zeiss CT Lucia three-piece IOL.  Looks great.

## 2021-12-01 DIAGNOSIS — L03031 Cellulitis of right toe: Secondary | ICD-10-CM | POA: Diagnosis not present

## 2021-12-14 ENCOUNTER — Ambulatory Visit: Payer: Self-pay | Admitting: *Deleted

## 2021-12-14 ENCOUNTER — Encounter: Payer: Self-pay | Admitting: Gastroenterology

## 2021-12-14 DIAGNOSIS — E1122 Type 2 diabetes mellitus with diabetic chronic kidney disease: Secondary | ICD-10-CM

## 2021-12-14 NOTE — Chronic Care Management (AMB) (Signed)
  Chronic Care Management   Note  12/14/2021 Name: [name] MRN: [id number] DOB: Aug 01, 1945   Patient has either met RN Care Management goals, is stable from [place] Management perspective, or has not recently engaged with the RN Care Manager. I am removing RN Care Manager from Care Team and closing [place]. If patient is currently engaged with another CCM team member I will forward this encounter to inform them of my case closure. Patient may be eligible for re-engagement with RN Care Manager in the future if necessary and can discuss this with their PCP.  [name], BSN, RN-BC Embedded Chronic Care Manager [facility] / [place] Management Direct Dial: [phone]

## 2021-12-14 NOTE — Patient Instructions (Signed)
[name]  I have previously worked with you through the Chronic Care Management Program at [facility]. Due to program changes I am removing myself from your care team because you've either met our goals, your conditions are stable and no longer require care management, or we haven't engaged within the past 6 months. If you are currently active with another CCM Team Member, you will remain active with them unless they reach out to you with additional information. If you feel that you need RN Care Management services in the future, please talk with your primary care provider to discuss re-engagement with the RN Care Manager that will be assigned to [facility]. This does not affect your status as a patient at [facility].   Thank you for allowing me to participate in your your healthcare journey.   , BSN, RN-BC Embedded Chronic Care Manager [facility] / [facility] Care Management Direct Dial: [phone]  

## 2021-12-15 ENCOUNTER — Other Ambulatory Visit (INDEPENDENT_AMBULATORY_CARE_PROVIDER_SITE_OTHER): Payer: Self-pay | Admitting: Ophthalmology

## 2021-12-18 ENCOUNTER — Ambulatory Visit: Payer: Medicare Other | Admitting: *Deleted

## 2021-12-18 DIAGNOSIS — E1122 Type 2 diabetes mellitus with diabetic chronic kidney disease: Secondary | ICD-10-CM

## 2021-12-18 NOTE — Chronic Care Management (AMB) (Signed)
  Chronic Care Management   Note  12/18/2021 Name: [name] MRN: [id number] DOB: [birth year]  Incoming call from patient regarding transition from Chronic Care Management to Care Coordination. Assessed current needs. Patient denies having at this time but would like to follow-up with Care Coordination nurse next month.   Follow up plan: Telephone follow up appointment with care management team member scheduled for: The patient has been provided with contact information for the care management team and has been advised to call with any health related questions or concerns.   [name], BSN, RN-BC Embedded Chronic Care Manager [facility] / [place] Management Direct Dial: [phone]

## 2021-12-18 NOTE — Patient Instructions (Signed)
Visit Information  Thank you for taking time to visit with me today. Please don't hesitate to contact me if I can be of assistance to you before your next scheduled telephone appointment.  Your next appointment is by telephone on 01/05/22 at 11:30 with Jackelyn Poling, RN Care Coordinator  Please call the care guide team at [phone] if you need to cancel or reschedule your appointment.   The patient verbalized understanding of instructions, educational materials, and care plan provided today and DECLINED offer to receive copy of patient instructions, educational materials, and care plan.   [name], BSN, RN-BC Embedded Chronic Care Manager [facility] / [place] Management Direct Dial: [phone]

## 2021-12-22 DIAGNOSIS — L03031 Cellulitis of right toe: Secondary | ICD-10-CM | POA: Diagnosis not present

## 2021-12-22 DIAGNOSIS — E1151 Type 2 diabetes mellitus with diabetic peripheral angiopathy without gangrene: Secondary | ICD-10-CM | POA: Diagnosis not present

## 2021-12-22 DIAGNOSIS — I70203 Unspecified atherosclerosis of native arteries of extremities, bilateral legs: Secondary | ICD-10-CM | POA: Diagnosis not present

## 2022-01-04 ENCOUNTER — Other Ambulatory Visit: Payer: Self-pay | Admitting: "Endocrinology

## 2022-01-04 DIAGNOSIS — E034 Atrophy of thyroid (acquired): Secondary | ICD-10-CM

## 2022-01-05 ENCOUNTER — Encounter: Payer: Self-pay | Admitting: *Deleted

## 2022-01-05 ENCOUNTER — Ambulatory Visit: Payer: Self-pay | Admitting: *Deleted

## 2022-01-05 NOTE — Patient Outreach (Signed)
  Care Coordination   Initial Visit Note   01/05/2022 Name: [name] MRN: [id number] DOB: August 05, 1945  [name] is a 76 y.o. year old female who sees [name] Norlander, DO for primary care. I spoke with  [name] by phone today  What matters to the patients health and wellness today?  Need talking watch to keep up with time,  paper with big lines     Goals Addressed               This Visit's Progress     Patient Stated     find help with getting DME to assist with vision [facility]) (pt-stated)   Not on track     Care Coordination Interventions: Evaluation of current treatment plan related to vision and patient's adherence to plan as established by provider Discussed plans with patient for ongoing care management follow up and provided patient with direct contact information for care management team Screening for signs and symptoms of depression related to chronic disease state  Assessed social determinant of health barriers Assist to locate visual aids such as a talking watch, paper with large lines etc         SDOH assessments and interventions completed:  Yes  SDOH Interventions Today    Flowsheet Row Most Recent Value  SDOH Interventions   Food Insecurity Interventions Intervention Not Indicated  Transportation Interventions Intervention Not Indicated        Care Coordination Interventions Activated:  Yes  Care Coordination Interventions:  Yes, provided   Follow up plan: Follow up call scheduled for 01/19/22 1130    Encounter Outcome:  Pt. Visit Completed   [name] L. [name] Hamman, RN, BSN, [place] Coordinator Office number [phone]

## 2022-01-05 NOTE — Patient Instructions (Addendum)
Visit Information  Thank you for taking time to visit with me today. Please don't hesitate to contact me if I can be of assistance to you.   Here is a list of companies that assists with visual aids [employer] for the [employer], independent living aids [phone],  [employer] products [phone],  [employer] [place] [phone], [employer] braille books for children [phone],  shop low vision [phone]."  You were signed up to receive catalogs from [employer] products, Max aids  Following are the goals we discussed today:   Goals Addressed               This Visit's Progress     Patient Stated     find help with getting DME to assist with vision [facility]) (pt-stated)   Not on track     Care Coordination Interventions: Evaluation of current treatment plan related to vision and patient's adherence to plan as established by provider Discussed plans with patient for ongoing care management follow up and provided patient with direct contact information for care management team Screening for signs and symptoms of depression related to chronic disease state  Assessed social determinant of health barriers Assist to locate visual aids such as a talking watch, paper with large lines etc         Our next appointment is by telephone on 01/19/22  at 1130  Please call the care guide team at [phone] if you need to cancel or reschedule your appointment.   If you are experiencing a Mental Health or [place] or need someone to talk to, please call the Suicide and Crisis Lifeline: 988 call the [place] National Suicide Prevention Lifeline: (314) 814-7710 or TTY: 3160035737 TTY 780-102-2454) to talk to a trained counselor call 1-800-273-TALK (toll free, 24 hour hotline) go to [facility] [address] [place] [phone]) call the [place]: [phone] call 911   The patient verbalized  understanding of instructions, educational materials, and care plan provided today and agreed to receive a mailed copy of patient instructions, educational materials, and care plan.   The patient has been provided with contact information for the care management team and has been advised to call with any health related questions or concerns.   [place] [name] Hamman, RN, BSN, [name] Coordinator Office number [phone]

## 2022-01-12 DIAGNOSIS — L97511 Non-pressure chronic ulcer of other part of right foot limited to breakdown of skin: Secondary | ICD-10-CM | POA: Diagnosis not present

## 2022-01-19 ENCOUNTER — Ambulatory Visit: Payer: Self-pay | Admitting: *Deleted

## 2022-01-19 ENCOUNTER — Encounter: Payer: Self-pay | Admitting: *Deleted

## 2022-01-19 NOTE — Patient Outreach (Signed)
  Care Coordination   Follow Up Visit Note   01/19/2022 Name: [name] MRN: [id number] DOB: 1945/06/27  [name] is a 76 y.o. year old female who sees [name] Norlander, DO for primary care. I spoke with  [name] by phone today.  What matters to the patients health and wellness today?  Follow up on talking watch to give time She recalls previous assistance from [name]   keeping up with appointments for her and spouse Insulin -lilly cares medication annual assistance for [place]  She reports she generally sends back her [employer] cares application by 57/8/46     Goals Addressed               This Visit's Progress     Patient Stated     find help with getting DME to assist with vision [name]) (pt-stated)   On track     Care Coordination Interventions: Evaluation of current treatment plan related to vision and patient's adherence to plan as established by provider Reviewed scheduled/upcoming provider appointments including 04/07/22 with pcp and 04/22/22 with endocrinology Discussed plans with patient for ongoing care management follow up and provided patient with direct contact information for care management team Screening for signs and symptoms of depression related to chronic disease state  Assessed social determinant of health barriers Updated on status of talking watch. Left a message at the regional services for the blind [phone] for assistance. Pending a return call. Spoke with staff at the deaf & hard of hearing division [phone]      Insulin annual medicine assistance (pt-stated)   On track     Care Coordination Interventions: Evaluation of current treatment plan related to annual medication assistance from [name] cares for her insulin and patient's adherence to plan as established by provider Will follow up with MD office staff and [name] pharmacy for assistance         SDOH assessments and interventions completed:  Yes  SDOH Interventions  Today    Flowsheet Row Most Recent Value  SDOH Interventions   Food Insecurity Interventions Intervention Not Indicated  Financial Strain Interventions Other (Comment)  [will attempt to find possible volunteer resources to assist for home chores]  Social Connections Interventions Intervention Not Indicated  Transportation Interventions Intervention Not Indicated        Care Coordination Interventions Activated:  Yes  Care Coordination Interventions:  Yes, provided   Follow up plan: Follow up call scheduled for 02/19/22    Encounter Outcome:  Pt. Visit Completed    [name] Jergens L. [name] Hamman, RN, BSN, [name] Office number [phone]

## 2022-01-19 NOTE — Patient Instructions (Addendum)
Visit Information  Thank you for taking time to visit with me today. Please don't hesitate to contact me if I can be of assistance to you.   Following are the goals we discussed today:   Goals Addressed               This Visit's Progress     Patient Stated     find help with getting DME to assist with vision [facility]) (pt-stated)   On track     Care Coordination Interventions: Evaluation of current treatment plan related to vision and patient's adherence to plan as established by provider Reviewed scheduled/upcoming provider appointments including 04/07/22 with pcp and 04/22/22 with endocrinology Discussed plans with patient for ongoing care management follow up and provided patient with direct contact information for care management team Screening for signs and symptoms of depression related to chronic disease state  Assessed social determinant of health barriers Updated on status of talking watch. Left a message at the regional services for the blind [phone] for assistance. Pending a return call. Spoke with staff at the deaf & hard of hearing division [phone]      Insulin annual medicine assistance (pt-stated)   On track     Care Coordination Interventions: Evaluation of current treatment plan related to annual medication assistance from [place] cares for her insulin and patient's adherence to plan as established by provider Will follow up with MD office staff and [place] for assistance         Our next appointment is by telephone on 02/19/22 at 1130  Please call the care guide team at [phone] if you need to cancel or reschedule your appointment.   If you are experiencing a Mental Health or [place] or need someone to talk to, please call the Suicide and Crisis Lifeline: 988 call the [place] National Suicide Prevention Lifeline: (401) 019-1588 or TTY: 8257570404 TTY (819)311-2238) to talk to a trained counselor call 1-800-273-TALK (toll free, 24  hour hotline) call the [facility]: ([phone] call 911   Patient verbalizes understanding of instructions and care plan provided today and agrees to view in [place]. Active MyChart status and patient understanding of how to access instructions and care plan via MyChart confirmed with patient.     The patient has been provided with contact information for the care management team and has been advised to call with any health related questions or concerns.   [place] Lavina Hamman, RN, BSN, [place] Coordinator Office number [phone]

## 2022-01-22 ENCOUNTER — Other Ambulatory Visit: Payer: Self-pay | Admitting: *Deleted

## 2022-01-22 NOTE — Patient Outreach (Signed)
  Care Coordination   Care coordination   Visit Note   01/22/2022 Name: [name] MRN: [id number] DOB: November 18, 1945  [name] is a 76 y.o. year old female who sees [name], DO for primary care. I  spoke with Kellie Simmering, [place] social worker for the blind [phone] extension [phone]  What matters to the patients health and wellness today?  Referral for community services/resources (talking watch, paper with large lines)  Previous Service for the blind case was closed and needs to be re open with a home assessment Presently [name] reports inability to order from the vendor for the talking watch     Goals Addressed               This Visit's Progress     Patient Stated     find help with getting DME to assist with vision [facility]) (pt-stated)   On track     Care Coordination Interventions: Referral to Kellie Simmering, Social worker for the services of the blind for community resources to include talking watch, large line paper etc.  [name] will outreach for a new home assessment Her number is [phone] extension [phone]        SDOH assessments and interventions completed:  No     Care Coordination Interventions Activated:  Yes  Care Coordination Interventions:  Yes, provided   Follow up plan: Follow up call scheduled for 02/19/22 1130    Encounter Outcome:  Pt. Visit Completed   Caelynn Marshman L. Lavina Hamman, RN, BSN, [place] Coordinator Office number [phone]

## 2022-01-22 NOTE — Patient Instructions (Signed)
Visit Information  Thank you for taking time to visit with me today. Please don't hesitate to contact me if I can be of assistance to you.   Following are the goals we discussed today:   Goals Addressed               This Visit's Progress     Patient Stated     find help with getting DME to assist with vision [facility]) (pt-stated)   On track     Care Coordination Interventions: Referral to Kellie Simmering, Social worker for the services of the blind for community resources to include talking watch, large line paper etc.  Nicki will outreach for a new home assessment Her number is [phone] extension [phone]        Our next appointment is by telephone on 02/19/22 at 11:30 am  Please call the care guide team at [phone] if you need to cancel or reschedule your appointment.   If you are experiencing a Mental Health or [place] or need someone to talk to, please call the Suicide and Crisis Lifeline: 988 call the [place] National Suicide Prevention Lifeline: 587-258-2647 or TTY: 5853144783 TTY 307-302-9798) to talk to a trained counselor call 1-800-273-TALK (toll free, 24 hour hotline) call the [facility]: 443-082-4415 call 911   The patient verbalized understanding of instructions, educational materials, and care plan provided today and DECLINED offer to receive copy of patient instructions, educational materials, and care plan.   The patient has been provided with contact information for the care management team and has been advised to call with any health related questions or concerns.   [place] Lavina Hamman, RN, BSN, [place] Coordinator Office number [phone]

## 2022-02-02 ENCOUNTER — Other Ambulatory Visit (HOSPITAL_COMMUNITY): Payer: Self-pay | Admitting: Podiatry

## 2022-02-02 ENCOUNTER — Telehealth: Payer: Self-pay | Admitting: *Deleted

## 2022-02-02 ENCOUNTER — Other Ambulatory Visit: Payer: Self-pay | Admitting: Podiatry

## 2022-02-02 DIAGNOSIS — L97511 Non-pressure chronic ulcer of other part of right foot limited to breakdown of skin: Secondary | ICD-10-CM | POA: Diagnosis not present

## 2022-02-02 DIAGNOSIS — S81809A Unspecified open wound, unspecified lower leg, initial encounter: Secondary | ICD-10-CM

## 2022-02-02 NOTE — Patient Instructions (Addendum)
Visit Information  Thank you for taking time to visit with me today. Please don't hesitate to contact me if I can be of assistance to you.   Following are the goals we discussed today:   Goals Addressed               This Visit's Progress     Patient Stated     Insulin annual medicine assistance [facility]) (pt-stated)        Care Coordination Interventions: [facility] pharmacy referral completed for Basaglar insuline annual medicine assistance/management for visually impaired diabetic patient        Our next appointment is by telephone on 02/19/22 at 1130  Please call the care guide team at [phone] if you need to cancel or reschedule your appointment.   If you are experiencing a Mental Health or [place] or need someone to talk to, please call the Suicide and Crisis Lifeline: 988 call the [place] National Suicide Prevention Lifeline: 201-453-9893 or TTY: 4144284318 TTY 810-098-7219) to talk to a trained counselor call 1-800-273-TALK (toll free, 24 hour hotline) call the [facility]: 601-568-4372 call 911   The patient verbalized understanding of instructions, educational materials, and care plan provided today and DECLINED offer to receive copy of patient instructions, educational materials, and care plan.   The patient has been provided with contact information for the care management team and has been advised to call with any health related questions or concerns.   [place] Lavina Hamman, RN, BSN, [place] Coordinator Office number [phone]

## 2022-02-02 NOTE — Patient Outreach (Signed)
  Care Coordination   Care coordination   Visit Note   02/02/2022 Name: [name] MRN: [id number] DOB: 1946-02-10  [name] is a 76 y.o. year old female who sees [name], DO for primary care. I  entered a [place] referral  What matters to the patients health and wellness today?  Medication assistance/management for annual re-enrollment with [facility] cares for basaglar medication    Goals Addressed               This Visit's Progress     Patient Stated     Insulin annual medicine assistance [facility]) (pt-stated)        Care Coordination Interventions: [facility] pharmacy referral completed for Basaglar insuline annual medicine assistance/management for visually impaired diabetic patient        SDOH assessments and interventions completed:  No     Care Coordination Interventions Activated:  Yes  Care Coordination Interventions:  Yes, provided   Follow up plan: Follow up call scheduled for 02/19/22    Encounter Outcome:  Pt. Visit Completed   Shereena Berquist L. Lavina Hamman, RN, BSN, [place] Coordinator Office number [phone]

## 2022-02-05 ENCOUNTER — Ambulatory Visit (HOSPITAL_COMMUNITY)
Admission: RE | Admit: 2022-02-05 | Discharge: 2022-02-05 | Disposition: A | Discharge status: Home / Self Care | Payer: Medicare Other | Source: Ambulatory Visit | Attending: Podiatry | Admitting: Podiatry

## 2022-02-05 DIAGNOSIS — S81809A Unspecified open wound, unspecified lower leg, initial encounter: Secondary | ICD-10-CM | POA: Diagnosis not present

## 2022-02-05 DIAGNOSIS — S91101A Unspecified open wound of right great toe without damage to nail, initial encounter: Secondary | ICD-10-CM | POA: Diagnosis not present

## 2022-02-17 ENCOUNTER — Ambulatory Visit (INDEPENDENT_AMBULATORY_CARE_PROVIDER_SITE_OTHER): Payer: Medicare Other | Admitting: Vascular Surgery

## 2022-02-17 ENCOUNTER — Encounter: Payer: Self-pay | Admitting: Vascular Surgery

## 2022-02-17 VITALS — BP 152/68 | HR 59 | Temp 97.0°F | Ht 62.0 in | Wt 162.0 lb

## 2022-02-17 DIAGNOSIS — I739 Peripheral vascular disease, unspecified: Secondary | ICD-10-CM

## 2022-02-17 NOTE — Progress Notes (Signed)
Vascular and Vein Specialist of [place]  Patient name: [name] MRN: [id number] DOB: 1946/05/05 Sex: female  REASON FOR CONSULT: Evaluation lower extremity arterial insufficiency and right great toe wound.  HPI: [name] is a 76 y.o. female, who is here today for evaluation of lower extremity arterial insufficiency.  She is here today with her husband who helps with history.  She does have chronic difficulty with unsteady gait and walks with a 4 pronged cane.  She is in a wheelchair when she is out and about in doctors offices.  She does have a long history of diabetes.  She reports that over a month ago she developed an ulceration on the tip of her right great toe and feels that she had a tight fitting shoe and this was caused by friction.  Her husband feels that this is improving.  She has been treated by Dr. Irving Shows with appropriate cleansing and ointment.  She is seen for evaluation of adequacy of arterial flow.  She does not walk to the level of claudication.  She denies any rest pain associated with her right foot.  Does have severe arthritis and also history of spine fracture.  He does not smoke cigarettes.  Past Medical History:  Diagnosis Date   Anal fistula    Anemia in chronic renal disease    Aranesp injection --  when Hg <11, last injection 12-24-14.   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    CKD (chronic kidney disease), stage III [facility])    nephrologist--  dr Mercy Moore-- LOV  [phone]   Complication of anesthesia    post-op confusion    Constipation    Coronary artery disease    Diabetic gastroparesis ([place])     Diabetic retinopathy ([place])    bilateral --  monitored by dr [name]   Diverticulosis of colon    GAD (generalized anxiety disorder)    GERD (gastroesophageal reflux disease)    History of colon polyps    benign   History of esophagitis    History of GI bleed    upper 2009  due to esophagitis  &  2002  due to Mallory-Weiss tear   History of hyperkalemia    pt had previously been canceled twice dos due to elevated K+, 07-29-2016 last date cancelled -- pt visited pcp same day treated w/ kayexelate and K+ came down, pt brought all her medication's in to pcp office and found pt was taking losartan that had been discontinued due to ckd, pcp stated this was cause of elevated K+   History of Mallory-Weiss syndrome    12/ 2002--  resolved   History of rectal abscess    12-29-2004  bedside I & D   Hyperlipidemia    Hypertension    Hypothyroidism    LAFB (left anterior fascicular block)    Right bundle branch block    Sacral decubitus ulcer    since 2014- 01-02-15 remains with wound" gauze dressing changes daily.   Type II diabetes mellitus ([place])     Family History  Problem Relation Age of Onset   Colon cancer Father 29  Prostate cancer Father    Diabetes Father    Coronary artery disease Mother 53   Heart disease Mother    Diabetes Mother    Coronary artery disease Sister 67   Diabetes Sister    Heart disease Sister    Diabetes Sister    Diabetes Sister    Diabetes Sister    Diabetes Sister    Diabetes Sister    Diabetes Maternal Grandmother    Esophageal cancer Neg Hx    Stomach cancer Neg Hx    Rectal cancer Neg Hx     SOCIAL HISTORY: Social History   Socioeconomic History   Marital status: Married    Spouse name: [name]   Number of children: 0   Years of education: 12   Highest education level: Not on file  Occupational History   Occupation: Retired    Fish farm manager: RETIRED  Tobacco Use   Smoking status: Never   Smokeless tobacco: Never  Vaping Use   Vaping Use: Never  used  Substance and Sexual Activity   Alcohol use: No   Drug use: No   Sexual activity: Not Currently  Other Topics Concern   Not on file  Social History Narrative   Lives with husband.    Caffeine use: none      01/19/22 No kids, support each other. Spouse diagnosed with cancer recently. She is legally blind but cares for spouse and vice versa. As of 2023 married for 55 years per pt   Social Determinants of Health   Financial Resource Strain: Low Risk  (01/19/2022)   Overall Financial Resource Strain (CARDIA)    Difficulty of Paying Living Expenses: Not very hard  Food Insecurity: No Food Insecurity (01/19/2022)   Hunger Vital Sign    Worried About Running Out of Food in the Last Year: Never true    Ran Out of Food in the Last Year: Never true  Transportation Needs: No Transportation Needs (01/19/2022)   PRAPARE - Hydrologist (Medical): No    Lack of Transportation (Non-Medical): No  Physical Activity: Inactive (04/24/2021)   Exercise Vital Sign    Days of Exercise per Week: 0 days    Minutes of Exercise per Session: 0 min  Stress: Stress Concern Present (04/24/2021)   [place]    Feeling of Stress : To some extent  Social Connections: Moderately Isolated (01/19/2022)   Social Connection and Isolation Panel [NHANES]    Frequency of Communication with Friends and Family: More than three times a week    Frequency of Social Gatherings with Friends and Family: More than three times a week    Attends Religious Services: Never    Marine scientist or Organizations: No    Attends Archivist Meetings: Never    Marital Status: Married  Human resources officer Violence: Not At Risk (01/05/2022)   Humiliation, Afraid, Rape, and Kick questionnaire    Fear of Current or Ex-Partner: No    Emotionally Abused: No    Physically Abused: No    Sexually Abused: No    Allergies  Allergen  Reactions   Aspirin Nausea And Vomiting   Ciprofloxacin Other (See Comments)    Upset Stomach   Codeine Nausea And Vomiting    Makes me sick    Hydrocodone-Acetaminophen     Other reaction(s): Unknown   Losartan     Hyperkalemia    Micardis [Telmisartan] Other (See Comments)  unknown   Nexium [Esomeprazole Magnesium] Other (See Comments)    Causes internal bleeding   Niaspan [Niacin Er] Other (See Comments)    Reaction is unknown   Nsaids     Other reaction(s): Unknown   Onglyza [Saxagliptin] Other (See Comments)    Reaction is unknown   Other     No otc pain medications   Oxycodone Hcl     Other reaction(s): Unknown   Rofecoxib Other (See Comments)    unknown   Simvastatin    Tequin [Gatifloxacin] Other (See Comments)    Reaction is unknown   Welchol [Colesevelam Hcl]    Amoxicillin Nausea And Vomiting and Rash    Has patient had a PCN reaction causing immediate rash, facial/tongue/throat swelling, SOB or lightheadedness with hypotension: Yes Has patient had a PCN reaction causing severe rash involving mucus membranes or skin necrosis: no  Has patient had a PCN reaction that required hospitalization No Has patient had a PCN reaction occurring within the last 10 years: No If all of the above answers are "NO", then may proceed with Cephalosporin use.     Current Outpatient Medications  Medication Sig Dispense Refill   amLODipine (NORVASC) 5 MG tablet Take 1 tablet (5 mg total) by mouth daily. 90 tablet 3   amLODipine (NORVASC) 5 MG tablet 1 tablet Orally Once a day     atorvastatin (LIPITOR) 10 MG tablet Take 1 tablet (10 mg total) by mouth daily. 90 tablet 03   carvedilol (COREG) 12.5 MG tablet TAKE 1 TABLET TWICE DAILY WITH A MEAL 180 tablet 3   carvedilol (COREG) 25 MG tablet as directed Orally twice a day     cephALEXin (KEFLEX) 500 MG capsule Take 500 mg by mouth 3 (three) times daily.     Cholecalciferol 25 MCG (1000 UT) capsule Take 1 capsule (1,000 Units  total) by mouth daily. 90 capsule 3   Cholecalciferol 50 MCG (2000 UT) CAPS 1 tablet Orally Once a day for 30 day(s)     colchicine 0.6 MG tablet Take 0.6 mg by mouth daily as needed.     colchicine 0.6 MG tablet 1 tablet Orally Once a day to prevent gout flares for 30 day(s)     doxycycline (VIBRAMYCIN) 100 MG capsule Take 100 mg by mouth 2 (two) times daily.     Febuxostat (ULORIC) 80 MG TABS 1 tablet Orally Once a day to manage gout for 30 day(s)     Febuxostat 80 MG TABS Take by mouth.     fluticasone (FLONASE) 50 MCG/ACT nasal spray Place 2 sprays into both nostrils daily. Use 2 sprays in each nostril daily 16 g 3   furosemide (LASIX) 40 MG tablet Take 1 tablet twice weekly 26 tablet 3   furosemide (LASIX) 40 MG tablet 1 tablet Orally Once a day     Insulin Glargine (BASAGLAR KWIKPEN) 100 UNIT/ML Inject 8 Units into the skin daily. 9 mL 3   ketorolac (ACULAR) 0.5 % ophthalmic solution Place 1 drop into the right eye 2 (two) times daily. 5 mL 12   Lancets Misc. MISC Use as directed to check blood sugar up to 4x daily. E11.65 300 each 3   levocetirizine (XYZAL) 5 MG tablet Take 1 tablet (5 mg total) by mouth at bedtime as needed for allergies (allergies/ ear fullness). 90 tablet 3   levofloxacin (LEVAQUIN) 500 MG tablet Take 500 mg by mouth daily.     levothyroxine (SYNTHROID) 50 MCG tablet TAKE 1 AND 1/2  TABLETS EVERY DAY BEFORE BREAKFAST 135 tablet 0   levothyroxine (SYNTHROID) 75 MCG tablet 1 tablet on an empty stomach in the morning Orally Once a day     losartan (COZAAR) 100 MG tablet 1 tablet Orally Once a day     meclizine (ANTIVERT) 25 MG tablet Take 0.5-1 tablets (12.5-25 mg total) by mouth 3 (three) times daily as needed for dizziness. 30 tablet 0   metoprolol succinate (TOPROL-XL) 50 MG 24 hr tablet 1 tablet Orally Once a day     mupirocin ointment (BACTROBAN) 2 % Apply topically as directed.     pantoprazole (PROTONIX) 20 MG tablet Take 1 tablet (20 mg total) by mouth daily. 90  tablet 3   pantoprazole (PROTONIX) 20 MG tablet 1 tablet Orally Once a day     prednisoLONE acetate (PRED FORTE) 1 % ophthalmic suspension Place 1 drop into the right eye 4 (four) times daily. 5 mL 0   silver sulfADIAZINE (SILVADENE) 1 % cream Apply topically as directed.     ULTICARE MICRO PEN NEEDLES 32G X 4 MM MISC TWICE DAILY 100 each 5   No current facility-administered medications for this visit.    REVIEW OF SYSTEMS:  '[X]'$  denotes positive finding, '[ ]'$  denotes negative finding Cardiac  Comments:  Chest pain or chest pressure:    Shortness of breath upon exertion:    Short of breath when lying flat:    Irregular heart rhythm:        Vascular    Pain in calf, thigh, or hip brought on by ambulation:    Pain in feet at night that wakes you up from your sleep:     Blood clot in your veins:    Leg swelling:         Pulmonary    Oxygen at home:    Productive cough:     Wheezing:         Neurologic    Sudden weakness in arms or legs:     Sudden numbness in arms or legs:     Sudden onset of difficulty speaking or slurred speech:    Temporary loss of vision in one eye:     Problems with dizziness:         Gastrointestinal    Blood in stool:     Vomited blood:         Genitourinary    Burning when urinating:     Blood in urine:        Psychiatric    Major depression:         Hematologic    Bleeding problems:    Problems with blood clotting too easily:        Skin    Rashes or ulcers:        Constitutional    Fever or chills:      PHYSICAL EXAM: Vitals:   02/17/22 1352  BP: (!) 152/68  Pulse: (!) 59  Temp: (!) 97 F (36.1 C)  SpO2: 97%  Weight: 162 lb (73.5 kg)  Height: '5\' 2"'$  (1.575 m)    GENERAL: The patient is a well-nourished female, in no acute distress. The vital signs are documented above. CARDIOVASCULAR: Plus radial pulses bilaterally.  Absent popliteal and distal pulses bilaterally. PULMONARY: There is good air exchange  MUSCULOSKELETAL: There  are no major deformities or cyanosis. NEUROLOGIC: No focal weakness or paresthesias are detected. SKIN: 1 to 1-1/2 cm eschar on the tip of her right great toe with  no evidence of infection PSYCHIATRIC: The patient has a normal affect.  DATA:  Noninvasive studies from 02/05/2022 were reviewed with the patient.  This reveals an ankle arm index of 0.61 on the right and 0.83 on the left with monophasic flow bilaterally.  MEDICAL ISSUES: Had a long discussion with the patient and her husband.  I explained that she certainly is borderline for healing the area on the tip of her great toe.  They report that this is improving and she is not having rest pain.  I explained the need for very close observation of this wound.  I did explain that if she has progression of tissue loss that I would recommend arteriography for further evaluation and potential revascularization.  She states that she is a very religious person and she is comfortable that this will heal without intervention.  Told her hopefully in the same but that she does have marginal flow for healing.  She will continue to follow-up with Dr. Irving Shows for local wound care.  We will become involved and will schedule arteriogram if she has progression of tissue loss   Rosetta Posner, MD FACS [facility] Specialists of [facility] [phone] Pager [phone]  Note: Portions of this report may have been transcribed using voice recognition software.  Every effort has been made to ensure accuracy; however, inadvertent computerized transcription errors may still be present.

## 2022-02-19 ENCOUNTER — Ambulatory Visit: Payer: Self-pay | Admitting: *Deleted

## 2022-02-19 NOTE — Patient Instructions (Signed)
Visit Information  Thank you for taking time to visit with me today. Please don't hesitate to contact me if I can be of assistance to you.   Following are the goals we discussed today:   Goals Addressed               This Visit's Progress     Patient Stated     find help with getting DME to assist with vision [facility]) (pt-stated)   On track     Care Coordination Interventions: Confirmed patient spoke with Kellie Simmering, Social worker for the services of the blind for community resources to include talking watch, large line paper etc.  Patient has received paper with large spacing but not a watch at this time.       Insulin annual medicine assistance [facility]) (pt-stated)   On track     Care Coordination Interventions: Confirmed patient is still pending forms for Basaglar insulin annual medicine assistance/management for visually impaired diabetic patient Patient confirmed no outreach from pharmacy yet but she does have an office visit on 04/06/22 at 2:45 pm        Our next appointment is by telephone on 04/07/22 at 1130  Please call the care guide team at [phone] if you need to cancel or reschedule your appointment.   If you are experiencing a Mental Health or [place] or need someone to talk to, please call the Suicide and Crisis Lifeline: 988 call the [place] National Suicide Prevention Lifeline: (619) 271-6079 or TTY: 209-339-6929 TTY 450 457 9887) to talk to a trained counselor call 1-800-273-TALK (toll free, 24 hour hotline) call the [facility]: (541)238-1897 call 911   The patient verbalized understanding of instructions, educational materials, and care plan provided today and DECLINED offer to receive copy of patient instructions, educational materials, and care plan.   The patient has been provided with contact information for the care management team and has been advised to call with any health related questions or concerns.  The [facility]armacy team will follow up with the patient and will provide direct communication to the PCP for this patient.   [place] Lavina Hamman, RN, BSN, [place] Coordinator Office number [phone]

## 2022-02-19 NOTE — Patient Outreach (Signed)
  Care Coordination   Follow Up Visit Note   02/19/2022 Name: [name] MRN: [id number] DOB: [birth year]  [name] is a 76 y.o. year old female who sees [name], DO for primary care. I spoke with  [name] by phone today.  What matters to the patients health and wellness today?  Did receive some papers from SW, [place] Has not received [employer] cares forms but has 04/06/22 2:45 pm  pcp appointment and can see pharmacy staff also at that time  Continues to follow the covid precautions Asked questions about precautions    Goals Addressed               This Visit's Progress     Patient Stated     find help with getting DME to assist with vision [facility]) (pt-stated)   On track     Care Coordination Interventions: Confirmed patient spoke with Kellie Simmering, Social worker for the services of the blind for community resources to include talking watch, large line paper etc.  Patient has received paper with large spacing but not a watch at this time.       Insulin annual medicine assistance [facility]) (pt-stated)   On track     Care Coordination Interventions: Confirmed patient is still pending forms for Basaglar insulin annual medicine assistance/management for visually impaired diabetic patient Patient confirmed no outreach from pharmacy yet but she does have an office visit on 04/06/22 at 2:45 pm        SDOH assessments and interventions completed:  Yes  SDOH Interventions Today    Flowsheet Row Most Recent Value  SDOH Interventions   Transportation Interventions Intervention Not Indicated  Utilities Interventions Intervention Not Indicated  Stress Interventions Intervention Not Indicated  [Her spouse has medical issues]        Care Coordination Interventions Activated:  Yes  Care Coordination Interventions:  Yes, provided   Follow up plan: Follow up call scheduled for 04/07/22    Encounter Outcome:  Pt. Visit Completed   [place] Esty L. Lavina Hamman, RN, BSN,  [place] Coordinator Office number [phone]

## 2022-02-22 DIAGNOSIS — N390 Urinary tract infection, site not specified: Secondary | ICD-10-CM | POA: Diagnosis not present

## 2022-02-22 DIAGNOSIS — I129 Hypertensive chronic kidney disease with stage 1 through stage 4 chronic kidney disease, or unspecified chronic kidney disease: Secondary | ICD-10-CM | POA: Diagnosis not present

## 2022-02-22 DIAGNOSIS — N183 Chronic kidney disease, stage 3 unspecified: Secondary | ICD-10-CM | POA: Diagnosis not present

## 2022-02-22 DIAGNOSIS — M109 Gout, unspecified: Secondary | ICD-10-CM | POA: Diagnosis not present

## 2022-02-22 DIAGNOSIS — R809 Proteinuria, unspecified: Secondary | ICD-10-CM | POA: Diagnosis not present

## 2022-02-22 DIAGNOSIS — E1122 Type 2 diabetes mellitus with diabetic chronic kidney disease: Secondary | ICD-10-CM | POA: Diagnosis not present

## 2022-02-22 DIAGNOSIS — D631 Anemia in chronic kidney disease: Secondary | ICD-10-CM | POA: Diagnosis not present

## 2022-02-22 DIAGNOSIS — N2581 Secondary hyperparathyroidism of renal origin: Secondary | ICD-10-CM | POA: Diagnosis not present

## 2022-02-26 ENCOUNTER — Ambulatory Visit: Payer: Medicare Other

## 2022-03-09 DIAGNOSIS — L97511 Non-pressure chronic ulcer of other part of right foot limited to breakdown of skin: Secondary | ICD-10-CM | POA: Diagnosis not present

## 2022-03-15 DIAGNOSIS — Z23 Encounter for immunization: Secondary | ICD-10-CM | POA: Diagnosis not present

## 2022-03-22 ENCOUNTER — Telehealth: Payer: Self-pay | Admitting: Family Medicine

## 2022-03-22 NOTE — Telephone Encounter (Signed)
[employer] fixed and sent 12.5.

## 2022-03-29 ENCOUNTER — Encounter: Payer: Self-pay | Admitting: Family Medicine

## 2022-03-29 ENCOUNTER — Ambulatory Visit (INDEPENDENT_AMBULATORY_CARE_PROVIDER_SITE_OTHER): Payer: Medicare Other | Admitting: Family Medicine

## 2022-03-29 VITALS — BP 123/61 | HR 46 | Temp 98.3°F | Ht 62.0 in | Wt 159.0 lb

## 2022-03-29 DIAGNOSIS — E034 Atrophy of thyroid (acquired): Secondary | ICD-10-CM

## 2022-03-29 DIAGNOSIS — H8112 Benign paroxysmal vertigo, left ear: Secondary | ICD-10-CM

## 2022-03-29 DIAGNOSIS — E1169 Type 2 diabetes mellitus with other specified complication: Secondary | ICD-10-CM | POA: Diagnosis not present

## 2022-03-29 DIAGNOSIS — E1122 Type 2 diabetes mellitus with diabetic chronic kidney disease: Secondary | ICD-10-CM | POA: Diagnosis not present

## 2022-03-29 DIAGNOSIS — I152 Hypertension secondary to endocrine disorders: Secondary | ICD-10-CM

## 2022-03-29 DIAGNOSIS — E1159 Type 2 diabetes mellitus with other circulatory complications: Secondary | ICD-10-CM

## 2022-03-29 DIAGNOSIS — E785 Hyperlipidemia, unspecified: Secondary | ICD-10-CM

## 2022-03-29 DIAGNOSIS — L97519 Non-pressure chronic ulcer of other part of right foot with unspecified severity: Secondary | ICD-10-CM | POA: Diagnosis not present

## 2022-03-29 DIAGNOSIS — N1832 Chronic kidney disease, stage 3b: Secondary | ICD-10-CM | POA: Diagnosis not present

## 2022-03-29 DIAGNOSIS — N183 Chronic kidney disease, stage 3 unspecified: Secondary | ICD-10-CM | POA: Diagnosis not present

## 2022-03-29 DIAGNOSIS — Z794 Long term (current) use of insulin: Secondary | ICD-10-CM | POA: Diagnosis not present

## 2022-03-29 DIAGNOSIS — E11621 Type 2 diabetes mellitus with foot ulcer: Secondary | ICD-10-CM | POA: Diagnosis not present

## 2022-03-29 LAB — BAYER DCA HB A1C WAIVED: HB A1C (BAYER DCA - WAIVED): 7.2 % — ABNORMAL HIGH (ref 4.8–5.6)

## 2022-03-29 MED ORDER — ATORVASTATIN CALCIUM 10 MG PO TABS: 10.0000 mg | ORAL_TABLET | Freq: Every day | ORAL | Status: DC

## 2022-03-29 MED ORDER — CARVEDILOL 12.5 MG PO TABS: ORAL_TABLET | ORAL | 3 refills | Status: DC

## 2022-03-29 MED ORDER — LEVOCETIRIZINE DIHYDROCHLORIDE 5 MG PO TABS: 5.0000 mg | ORAL_TABLET | Freq: Every evening | ORAL | 3 refills | Status: DC | PRN

## 2022-03-29 MED ORDER — AMLODIPINE BESYLATE 5 MG PO TABS: 5.0000 mg | ORAL_TABLET | Freq: Every day | ORAL | 3 refills | Status: DC

## 2022-03-29 MED ORDER — FUROSEMIDE 40 MG PO TABS: ORAL_TABLET | ORAL | 3 refills | Status: DC

## 2022-03-29 MED ORDER — LEVOTHYROXINE SODIUM 75 MCG PO TABS: 75.0000 ug | ORAL_TABLET | Freq: Every day | ORAL | 3 refills | Status: DC

## 2022-03-29 MED ORDER — PANTOPRAZOLE SODIUM 20 MG PO TBEC: 20.0000 mg | DELAYED_RELEASE_TABLET | Freq: Every day | ORAL | 3 refills | Status: DC

## 2022-03-29 NOTE — Progress Notes (Signed)
Subjective: CC:DM PCP: [name] Norlander, DO FBP:ZWCHE H Pollman is a 76 y.o. female presenting to clinic today for:  1. Type 2 Diabetes with hypertension, hyperlipidemia and CKD3:  Patient is accompanied today's visit by her spouse.  She reports doing well.  She is compliant with all medications.  She will be needing updated forms for her [place] for patient assistance program and she will bring this ASAP.  She has seen her nephrologist in the last couple of weeks and was told that she was doing well.  Last eye exam: needs, sees Dr. Zadie [name] for known macular edema etc. Last foot exam: needs, sees Dr. Irving [name] and reportedly had this done Last A1c:  Lab Results  Component Value Date   HGBA1C 7.1 (H) 09/29/2021   Nephropathy screen indicated?: needs Last flu, zoster and/or pneumovax:  Immunization History  Administered Date(s) Administered   COVID-19, mRNA, vaccine(Comirnaty)12 years and older 03/15/2022   Fluad Quad(high Dose 65+) 02/05/2019, 03/05/2020, 03/10/2021, 03/15/2022   Influenza, High Dose Seasonal PF 03/05/2016, 03/14/2017, 03/01/2018   Influenza,inj,Quad PF,6+ Mos 02/16/2013, 02/27/2015   Moderna Covid-19 Vaccine Bivalent Booster 66yr & up 03/10/2021   Moderna Sars-Covid-2 Vaccination 10/14/2020   PFIZER(Purple Top)SARS-COV-2 Vaccination 06/13/2019, 07/04/2019, 03/05/2020   Pneumococcal Conjugate-13 10/11/2014   Pneumococcal Polysaccharide-23 10/10/2012   Tdap 10/06/2012   Zoster Recombinat (Shingrix) 06/02/2021, 08/04/2021    ROS: No chest pain or shortness of breath.  She has a diabetic foot ulcer on the right great toe and is under the care of of podiatry for this.  2. Hypothyroidism Compliant with her medications but notes difficulty splitting that Synthroid in half.  She would like to have the 75 mcg sent to pharmacy.  No reports of tremor, difficulty swallowing or changes in bowel habits  ROS: Per HPI  Allergies  Allergen Reactions   Aspirin Nausea And  Vomiting   Ciprofloxacin Other (See Comments)    Upset Stomach   Codeine Nausea And Vomiting    Makes me sick    Hydrocodone-Acetaminophen     Other reaction(s): Unknown   Losartan     Hyperkalemia    Micardis [Telmisartan] Other (See Comments)    unknown   Nexium [Esomeprazole Magnesium] Other (See Comments)    Causes internal bleeding   Niaspan [Niacin Er] Other (See Comments)    Reaction is unknown   Nsaids     Other reaction(s): Unknown   Onglyza [Saxagliptin] Other (See Comments)    Reaction is unknown   Other     No otc pain medications   Oxycodone Hcl     Other reaction(s): Unknown   Rofecoxib Other (See Comments)    unknown   Simvastatin    Tequin [Gatifloxacin] Other (See Comments)    Reaction is unknown   Welchol [Colesevelam Hcl]    Amoxicillin Nausea And Vomiting and Rash    Has patient had a PCN reaction causing immediate rash, facial/tongue/throat swelling, SOB or lightheadedness with hypotension: Yes Has patient had a PCN reaction causing severe rash involving mucus membranes or skin necrosis: no  Has patient had a PCN reaction that required hospitalization No Has patient had a PCN reaction occurring within the last 10 years: No If all of the above answers are "NO", then may proceed with Cephalosporin use.    Past Medical History:  Diagnosis Date   Anal fistula    Anemia in chronic renal disease    Aranesp injection --  when Hg <11, last injection 12-24-14.   Anxiety  Aphakia of eye, right 10/02/2019   Condition resolved, status post vitrectomy, removal IOL, insertion Yamani scleral tunnel Ze[facility] CT Lucio lens June 2021   Arthritis    knees and hand/fingers. 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    CKD (chronic kidney disease), stage III [facility])    nephrologist--  dr Mercy Moore-- LOV  [phone]   Complication of anesthesia    post-op  confusion    Constipation    Coronary artery disease    Diabetic gastroparesis ([place])    Diabetic retinopathy ([place])    bilateral --  monitored by dr [name]   Diverticulosis of colon    GAD (generalized anxiety disorder)    GERD (gastroesophageal reflux disease)    History of colon polyps    benign   History of esophagitis    History of GI bleed    upper 2009  due to esophagitis  &  2002  due to Mallory-Weiss tear   History of hyperkalemia    pt had previously been canceled twice dos due to elevated K+, 07-29-2016 last date cancelled -- pt visited pcp same day treated w/ kayexelate and K+ came down, pt brought all her medication's in to pcp office and found pt was taking losartan that had been discontinued due to ckd, pcp stated this was cause of elevated K+   History of Mallory-Weiss syndrome    12/ 2002--  resolved   History of rectal abscess    12-29-2004  bedside I & D   Hyperlipidemia    Hypertension    Hypothyroidism    LAFB (left anterior fascicular block)    Right bundle branch block    Sacral decubitus ulcer    since 2014- 01-02-15 remains with wound" gauze dressing changes daily.   Type II diabetes mellitus (HCC)     Current Outpatient Medications:    amLODipine (NORVASC) 5 MG tablet, Take 1 tablet (5 mg total) by mouth daily., Disp: 90 tablet, Rfl: 3   atorvastatin (LIPITOR) 10 MG tablet, Take 1 tablet (10 mg total) by mouth daily., Disp: 90 tablet, Rfl: 03   carvedilol (COREG) 12.5 MG tablet, TAKE 1 TABLET TWICE DAILY WITH A MEAL, Disp: 180 tablet, Rfl: 3   cephALEXin (KEFLEX) 500 MG capsule, Take 500 mg by mouth 3 (three) times daily., Disp: , Rfl:    Cholecalciferol 25 MCG (1000 UT) capsule, Take 1 capsule (1,000 Units total) by mouth daily., Disp: 90 capsule, Rfl: 3   colchicine 0.6 MG tablet, Take 0.6 mg by mouth daily as needed., Disp: , Rfl:    doxycycline (VIBRAMYCIN) 100 MG capsule, Take 100 mg by mouth 2 (two) times daily., Disp: , Rfl:    Febuxostat (ULORIC)  80 MG TABS, 1 tablet Orally Once a day to manage gout for 30 day(s), Disp: , Rfl:    Febuxostat 80 MG TABS, Take by mouth., Disp: , Rfl:    fluticasone (FLONASE) 50 MCG/ACT nasal spray, Place 2 sprays into both nostrils daily. Use 2 sprays in each nostril daily, Disp: 16 g, Rfl: 3   furosemide (LASIX) 40 MG tablet, Take 1 tablet twice weekly, Disp: 26 tablet, Rfl: 3   Insulin Glargine (BASAGLAR KWIKPEN) 100 UNIT/ML, Inject 8 Units into the skin daily., Disp: 9 mL, Rfl: 3   ketorolac (ACULAR) 0.5 % ophthalmic solution, Place 1 drop into the right eye 2 (two) times daily.,  Disp: 5 mL, Rfl: 12   Lancets Misc. MISC, Use as directed to check blood sugar up to 4x daily. E11.65, Disp: 300 each, Rfl: 3   levocetirizine (XYZAL) 5 MG tablet, Take 1 tablet (5 mg total) by mouth at bedtime as needed for allergies (allergies/ ear fullness)., Disp: 90 tablet, Rfl: 3   levofloxacin (LEVAQUIN) 500 MG tablet, Take 500 mg by mouth daily., Disp: , Rfl:    levothyroxine (SYNTHROID) 50 MCG tablet, TAKE 1 AND 1/2 TABLETS EVERY DAY BEFORE BREAKFAST, Disp: 135 tablet, Rfl: 0   levothyroxine (SYNTHROID) 75 MCG tablet, 1 tablet on an empty stomach in the morning Orally Once a day, Disp: , Rfl:    losartan (COZAAR) 100 MG tablet, 1 tablet Orally Once a day, Disp: , Rfl:    meclizine (ANTIVERT) 25 MG tablet, Take 0.5-1 tablets (12.5-25 mg total) by mouth 3 (three) times daily as needed for dizziness., Disp: 30 tablet, Rfl: 0   metoprolol succinate (TOPROL-XL) 50 MG 24 hr tablet, 1 tablet Orally Once a day, Disp: , Rfl:    mupirocin ointment (BACTROBAN) 2 %, Apply topically as directed., Disp: , Rfl:    pantoprazole (PROTONIX) 20 MG tablet, Take 1 tablet (20 mg total) by mouth daily., Disp: 90 tablet, Rfl: 3   prednisoLONE acetate (PRED FORTE) 1 % ophthalmic suspension, Place 1 drop into the right eye 4 (four) times daily., Disp: 5 mL, Rfl: 0   silver sulfADIAZINE (SILVADENE) 1 % cream, Apply topically as directed., Disp: ,  Rfl:    ULTICARE MICRO PEN NEEDLES 32G X 4 MM MISC, TWICE DAILY, Disp: 100 each, Rfl: 5 Social History   Socioeconomic History   Marital status: Married    Spouse name: [name]   Number of children: 0   Years of education: 12   Highest education level: Not on file  Occupational History   Occupation: Retired    Fish farm manager: RETIRED  Tobacco Use   Smoking status: Never   Smokeless tobacco: Never  Vaping Use   Vaping Use: Never used  Substance and Sexual Activity   Alcohol use: No   Drug use: No   Sexual activity: Not Currently  Other Topics Concern   Not on file  Social History Narrative   Lives with husband.    Caffeine use: none      01/19/22 No kids, support each other. Spouse diagnosed with cancer recently. She is legally blind but cares for spouse and vice versa. As of 2023 married for 55 years per pt   Social Determinants of Health   Financial Resource Strain: Low Risk  (01/19/2022)   Overall Financial Resource Strain (CARDIA)    Difficulty of Paying Living Expenses: Not very hard  Food Insecurity: No Food Insecurity (01/19/2022)   Hunger Vital Sign    Worried About Running Out of Food in the Last Year: Never true    Ran Out of Food in the Last Year: Never true  Transportation Needs: No Transportation Needs (02/19/2022)   PRAPARE - Hydrologist (Medical): No    Lack of Transportation (Non-Medical): No  Physical Activity: Inactive (04/24/2021)   Exercise Vital Sign    Days of Exercise per Week: 0 days    Minutes of Exercise per Session: 0 min  Stress: No Stress Concern Present (02/19/2022)   [place]    Feeling of Stress : Only a little  Social Connections: Moderately Isolated (01/19/2022)   Social  Connection and Isolation Panel [NHANES]    Frequency of Communication with Friends and Family: More than three times a week    Frequency of Social Gatherings with Friends and Family:  More than three times a week    Attends Religious Services: Never    Marine scientist or Organizations: No    Attends Archivist Meetings: Never    Marital Status: Married  Human resources officer Violence: Not At Risk (01/05/2022)   Humiliation, Afraid, Rape, and Kick questionnaire    Fear of Current or Ex-Partner: No    Emotionally Abused: No    Physically Abused: No    Sexually Abused: No   Family History  Problem Relation Age of Onset   Colon cancer Father 12   Prostate cancer Father    Diabetes Father    Coronary artery disease Mother 75   Heart disease Mother    Diabetes Mother    Coronary artery disease Sister 40   Diabetes Sister    Heart disease Sister    Diabetes Sister    Diabetes Sister    Diabetes Sister    Diabetes Sister    Diabetes Sister    Diabetes Maternal Grandmother    Esophageal cancer Neg Hx    Stomach cancer Neg Hx    Rectal cancer Neg Hx     Objective: Office vital signs reviewed. BP 123/61   Pulse (!) 46   Temp 98.3 F (36.8 C)   Ht '5\' 2"'$  (1.575 m)   Wt 159 lb (72.1 kg)   SpO2 97%   BMI 29.08 kg/m   Physical Examination:  General: Awake, alert, well nourished, No acute distress HEENT: sclera white, no exophthalmos Cardio: Bradycardia with regular rhythm, S1S2 heard, no murmurs appreciated Pulm: clear to auscultation bilaterally, no wheezes, rhonchi or rales; normal work of breathing on room air MSK: Arrives in wheelchair Neuro: see DM foot, no tremor  Diabetic Foot Exam - Simple   Simple Foot Form Diabetic Foot exam was performed with the following findings: Yes 03/29/2022  8:22 AM  Visual Inspection See comments: Yes Sensation Testing Intact to touch and monofilament testing bilaterally: Yes Pulse Check Posterior Tibialis and Dorsalis pulse intact bilaterally: Yes Comments She has valgus deformity of several digits bilaterally.  Right tip of great toe with healing ulcer.      Assessment/ Plan: 76 y.o. female    Type 2 diabetes mellitus with stage 3b chronic kidney disease, with long-term current use of insulin ([place]) - Plan: Bayer DCA Hb A1c Waived, Basic Metabolic Panel, Microalbumin / creatinine urine ratio  Hyperlipidemia associated with type 2 diabetes mellitus ([place]) - Plan: Lipid Panel, atorvastatin (LIPITOR) 10 MG tablet  Hypertension associated with diabetes ([place]) - Plan: Basic Metabolic Panel, amLODipine (NORVASC) 5 MG tablet  Hypothyroidism due to acquired atrophy of thyroid - Plan: TSH, T4, Free, levothyroxine (SYNTHROID) 75 MCG tablet  Diabetic ulcer of right great toe (HCC)  BPPV (benign paroxysmal positional vertigo), left - Plan: levocetirizine (XYZAL) 5 MG tablet  Check sugar.  Continue to follow-up with nephrology.  Check urine microalbumin.  BMP collected given mild hyperkalemia noted last visit  Check lipid panel.  Continue statin.  Blood pressure under excellent control.  Medications have been renewed.  Asymptomatic from a thyroid standpoint.  Check thyroid levels.  Synthroid 75 mcg sent for convenience  Diabetic foot ulcer on the right great toe noted today.  Continue follow-up with podiatry.  No evidence of secondary bacterial infection or complication.  No orders of the defined types were placed in this encounter.  No orders of the defined types were placed in this encounter.    [name] Norlander, DO [place] [phone]

## 2022-03-30 LAB — BASIC METABOLIC PANEL
BUN/Creatinine Ratio: 16 (ref 12–28)
BUN: 29 mg/dL — ABNORMAL HIGH (ref 8–27)
CO2: 21 mmol/L (ref 20–29)
Calcium: 9.1 mg/dL (ref 8.7–10.3)
Chloride: 105 mmol/L (ref 96–106)
Creatinine, Ser: 1.8 mg/dL — ABNORMAL HIGH (ref 0.57–1.00)
Glucose: 179 mg/dL — ABNORMAL HIGH (ref 70–99)
Potassium: 5 mmol/L (ref 3.5–5.2)
Sodium: 141 mmol/L (ref 134–144)
eGFR: 29 mL/min/{1.73_m2} — ABNORMAL LOW (ref >59)

## 2022-03-30 LAB — LIPID PANEL
Chol/HDL Ratio: 2.6 ratio (ref 0.0–4.4)
Cholesterol, Total: 108 mg/dL (ref 100–199)
HDL: 42 mg/dL (ref >39)
LDL Chol Calc (NIH): 48 mg/dL (ref 0–99)
Triglycerides: 93 mg/dL (ref 0–149)
VLDL Cholesterol Cal: 18 mg/dL (ref 5–40)

## 2022-03-30 LAB — T4, FREE: Free T4: 1.54 ng/dL (ref 0.82–1.77)

## 2022-03-30 LAB — TSH: TSH: 2.36 u[IU]/mL (ref 0.450–4.500)

## 2022-04-06 DIAGNOSIS — B351 Tinea unguium: Secondary | ICD-10-CM | POA: Diagnosis not present

## 2022-04-06 DIAGNOSIS — M79676 Pain in unspecified toe(s): Secondary | ICD-10-CM | POA: Diagnosis not present

## 2022-04-06 DIAGNOSIS — E1142 Type 2 diabetes mellitus with diabetic polyneuropathy: Secondary | ICD-10-CM | POA: Diagnosis not present

## 2022-04-06 DIAGNOSIS — L84 Corns and callosities: Secondary | ICD-10-CM | POA: Diagnosis not present

## 2022-04-07 ENCOUNTER — Ambulatory Visit: Payer: Self-pay | Admitting: *Deleted

## 2022-04-07 NOTE — Patient Outreach (Signed)
  Care Coordination   Follow Up Visit Note   04/07/2022 Name: ANAIJA WISSINK MRN: [id number] DOB: [birth year]  SILVIE OBREMSKI is a 76 y.o. year old female who sees [name], DO for primary care. I spoke with  [name] by phone today.  What matters to the patients health and wellness today?  Need for basaglar insulin re enrollment with [employer]. She voices concern that she did not want to have concerns like she had in 2022 Reports 2 days before the deadline she received a cal from the [employer] inquiring about her renewal application  Osteoarthritis is being managed as best as she can at home  Resources from the Blind association received She did get a watch She wants RN CM to express her appreciation to [place]               This Visit's Progress     Patient Stated     COMPLETED: find help with getting DME to assist with vision [facility]) (pt-stated)   On track     Care Coordination Interventions: Confirmed patient hs received her talking watch with assistance of Kellie Simmering, Education officer, museum for the services of the blind  Patient has received paper with large spacing. She voices her appreciation for N Gann's assistance RN CM sent an e-mail as patient requested to Sherre Scarlet to express her appreciation.  Goal complete      Insulin annual medicine assistance [facility]) (pt-stated)   Not on track     Care Coordination Interventions: Confirmed patient is still pending forms for Basaglar insulin annual medicine assistance/management for visually impaired diabetic patient Patient confirmed no outreach from pharmacy at this time Confirmed with patient that a pharmacy referral was sent on 02/02/22  RN CM  sent a secure message to [facility] pharmacist inquiring about an update Mailed a copy of the [employer] patient assistance program application to the patient         SDOH assessments and interventions completed:  No      Care Coordination Interventions Activated:  Yes  Care Coordination Interventions:  Yes, provided   Follow up plan: Follow up call scheduled for 04/21/22    Encounter Outcome:  Pt. Visit Completed   Turquoise Esch L. Lavina Hamman, RN, BSN, [place] Coordinator Office number [phone]

## 2022-04-07 NOTE — Patient Instructions (Signed)
Visit Information  Thank you for taking time to visit with me today. Please don't hesitate to contact me if I can be of assistance to you.   Following are the goals we discussed today:   Goals Addressed   None     Our next appointment is by telephone on 04/21/22 at 1100  Please call the care guide team at [phone] if you need to cancel or reschedule your appointment.   If you are experiencing a Mental Health or [place] or need someone to talk to, please call the Suicide and Crisis Lifeline: 988 call the [place] National Suicide Prevention Lifeline: (351)591-9185 or TTY: (930) 800-6892 TTY 219-178-2212) to talk to a trained counselor call 1-800-273-TALK (toll free, 24 hour hotline) call the [facility]: (229)165-1597 call 911   The patient verbalized understanding of instructions, educational materials, and care plan provided today and DECLINED offer to receive copy of patient instructions, educational materials, and care plan.   The patient has been provided with contact information for the care management team and has been advised to call with any health related questions or concerns.   Corey Caulfield L. Lavina Hamman, RN, BSN, [place] Coordinator Office number [phone]

## 2022-04-19 ENCOUNTER — Telehealth: Payer: Self-pay | Admitting: Family Medicine

## 2022-04-19 NOTE — Telephone Encounter (Signed)
Pt has ppw filled out and is ready to drop off.

## 2022-04-20 ENCOUNTER — Telehealth: Payer: Self-pay | Admitting: *Deleted

## 2022-04-20 NOTE — Patient Outreach (Signed)
  Care Coordination   Follow Up Visit Note   04/20/2022 Name: [name] MRN: [id number] DOB: 28-Aug-1945  [name] is a 76 y.o. year old female who sees [name], DO for primary care. I spoke with  [name] by phone today when she called RN CM   What matters to the patients health and wellness today?  Medication assistance for 2023 insulin renewal    Goals Addressed               This Visit's Progress     Patient Stated     Insulin annual medicine assistance [facility]) (pt-stated)   On track     Care Coordination Interventions: With a message and a call from the patient, Confirmed patient has received forms for Basaglar insulin annual medicine assistance/management and has completed her part of the forms & has spoken with pcp RN today. She confirms she will take them to the MD office on 04/21/22 to be completed by the office pharmacist & PCP Will continue to follow up and collaborate with patient, pcp and pharmacist for timely medication assistance for insuline        SDOH assessments and interventions completed:  Yes     Care Coordination Interventions:  Yes, provided   Follow up plan: Follow up call scheduled for 04/21/22    Encounter Outcome:  Pt. Visit Completed    Elaiza Shoberg L. Lavina Hamman, RN, BSN, [place] Coordinator Office number [phone]

## 2022-04-20 NOTE — Patient Instructions (Signed)
Visit Information  Thank you for taking time to visit with me today. Please don't hesitate to contact me if I can be of assistance to you.   Following are the goals we discussed today:   Goals Addressed               This Visit's Progress     Patient Stated     Insulin annual medicine assistance [facility]) (pt-stated)   On track     Care Coordination Interventions: With a message and a call from the patient, Confirmed patient has received forms for Basaglar insulin annual medicine assistance/management and has completed her part of the forms & has spoken with pcp RN today. She confirms she will take them to the MD office on 04/21/22 to be completed by the office pharmacist & PCP Will continue to follow up and collaborate with patient, pcp and pharmacist for timely medication assistance for insuline        Our next appointment is by telephone on 04/21/22 at 1100  Please call the care guide team at [phone] if you need to cancel or reschedule your appointment.   If you are experiencing a Mental Health or [place] or need someone to talk to, please call the Suicide and Crisis Lifeline: 988 call the [place] National Suicide Prevention Lifeline: (314)023-4168 or TTY: 860-166-3840 TTY 779-748-1903) to talk to a trained counselor call 1-800-273-TALK (toll free, 24 hour hotline) call the [facility]: 531-660-4488 call 911   The patient verbalized understanding of instructions, educational materials, and care plan provided today and DECLINED offer to receive copy of patient instructions, educational materials, and care plan.   The patient has been provided with contact information for the care management team and has been advised to call with any health related questions or concerns.   Taneika Choi L. Lavina Hamman, RN, BSN, [place] Coordinator Office number [phone]

## 2022-04-21 ENCOUNTER — Ambulatory Visit: Payer: Self-pay | Admitting: *Deleted

## 2022-04-21 NOTE — Patient Outreach (Signed)
  Care Coordination   Follow Up Visit Note   04/21/2022 Name: [name] MRN: [id number] DOB: [birth year]  [name] is a 76 y.o. year old female who sees [name], DO for primary care. I  collaborated with patient's primary care provider and office pharmacist   What matters to the patients health and wellness today?  Medication assistance - collaboration with pcp and pharmacist to discuss the patient concerns voiced on 19/50/93 about her application for medication assistance for insulin Patient confirmed she dropped medication assistance forms off to pcp office today Ventilated her feelings   Goals Addressed               This Visit's Progress     Patient Stated     Insulin annual medicine assistance [facility] (pt-stated)   On track     Care Coordination Interventions: Collaborated with pcp and pcp pharmacist  regarding patient's medication application for insulin Informed them of patient intentions to stop the patient completed portion + by the pcp office on 04/21/22 & her request for a timely manner for forms to be completed and faxed to [employer] Discussed plans with patient for ongoing care management follow up and provided patient with direct contact information for care management team Outreach & Confirmed patient stopped her forms for Basaglar insulin annual medicine assistance/management by the MD office today 04/21/22 to be completed by the office pharmacist & PCP PCP updated RN CM Will continue to follow up and collaborate with patient, pcp and pharmacist for timely medication assistance for insulin         SDOH assessments and interventions completed:  Yes      Care Coordination Interventions:  Yes, provided   Follow up plan: Follow up call scheduled for 04/28/22    Encounter Outcome:  Pt. Visit Completed   Crestina Strike L. Lavina Hamman, RN, BSN, [place] Coordinator Office number [phone]

## 2022-04-21 NOTE — Patient Instructions (Signed)
Visit Information  Thank you for taking time to visit with me today. Please don't hesitate to contact me if I can be of assistance to you.   Following are the goals we discussed today:   Goals Addressed               This Visit's Progress     Patient Stated     Insulin annual medicine assistance [facility]) (pt-stated)   On track     Care Coordination Interventions: Collaborated with pcp and pcp pharmacist  regarding patient's medication application for insulin Informed them of patient intentions to stop the patient completed portion + by the pcp office on 04/21/22 & her request for a timely manner for forms to be completed and faxed to [employer] Discussed plans with patient for ongoing care management follow up and provided patient with direct contact information for care management team Outreach & Confirmed patient stopped her forms for Basaglar insulin annual medicine assistance/management by the MD office today 04/21/22 to be completed by the office pharmacist & PCP PCP updated RN CM Will continue to follow up and collaborate with patient, pcp and pharmacist for timely medication assistance for insulin         Our next appointment is by telephone on 04/28/22 at 2:30 pm  Please call the care guide team at [phone] if you need to cancel or reschedule your appointment.   If you are experiencing a Mental Health or [place] or need someone to talk to, please call the Suicide and Crisis Lifeline: 988 call the [place] National Suicide Prevention Lifeline: 628-401-8215 or TTY: (226) 265-5571 TTY 507-565-9413) to talk to a trained counselor call 1-800-273-TALK (toll free, 24 hour hotline) call the [facility]: (502)440-3908 call 911   The patient verbalized understanding of instructions, educational materials, and care plan provided today and DECLINED offer to receive copy of patient instructions, educational materials, and care plan.   The  patient has been provided with contact information for the care management team and has been advised to call with any health related questions or concerns.   Teresha Hanks L. Lavina Hamman, RN, BSN, [place] Coordinator Office number [phone]

## 2022-04-22 ENCOUNTER — Ambulatory Visit (INDEPENDENT_AMBULATORY_CARE_PROVIDER_SITE_OTHER): Payer: Medicare Other | Admitting: "Endocrinology

## 2022-04-22 ENCOUNTER — Encounter: Payer: Self-pay | Admitting: "Endocrinology

## 2022-04-22 VITALS — BP 128/60 | HR 60 | Ht 62.0 in | Wt 147.0 lb

## 2022-04-22 DIAGNOSIS — Z794 Long term (current) use of insulin: Secondary | ICD-10-CM

## 2022-04-22 DIAGNOSIS — E782 Mixed hyperlipidemia: Secondary | ICD-10-CM | POA: Diagnosis not present

## 2022-04-22 DIAGNOSIS — E1122 Type 2 diabetes mellitus with diabetic chronic kidney disease: Secondary | ICD-10-CM | POA: Diagnosis not present

## 2022-04-22 DIAGNOSIS — I10 Essential (primary) hypertension: Secondary | ICD-10-CM

## 2022-04-22 DIAGNOSIS — E034 Atrophy of thyroid (acquired): Secondary | ICD-10-CM | POA: Diagnosis not present

## 2022-04-22 DIAGNOSIS — N1832 Chronic kidney disease, stage 3b: Secondary | ICD-10-CM

## 2022-04-22 MED ORDER — BASAGLAR KWIKPEN 100 UNIT/ML ~~LOC~~ SOPN: 10.0000 [IU] | PEN_INJECTOR | Freq: Every day | SUBCUTANEOUS | 1 refills | Status: DC

## 2022-04-22 NOTE — Progress Notes (Signed)
04/22/2022   Endocrinology follow-up note   Subjective:    Patient ID: [name], female    DOB: 1945-09-27.  She is being seen in follow-up in the management of uncontrolled type 2 diabetes, hypothyroidism, hypertension, hyperlipidemia.  Past Medical History:  Diagnosis Date   Anal fistula    Anemia in chronic renal disease    Aranesp injection --  when Hg <11, last injection 12-24-14.   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    CKD (chronic kidney disease), stage III [facility])    nephrologist--  dr Mercy Moore-- LOV  [phone]   Complication of anesthesia    post-op confusion    Constipation    Coronary artery disease    Diabetic gastroparesis ([place])    Diabetic retinopathy ([place])    bilateral --  monitored by dr [name]   Diverticulosis of colon    GAD (generalized anxiety disorder)    GERD (gastroesophageal reflux disease)    Hip fracture requiring operative repair [facility])    History of colon polyps    benign   History of esophagitis    History of GI bleed    upper 2009  due to esophagitis  &  2002  due to Mallory-Weiss tear   History of hyperkalemia    pt had previously been canceled twice dos due to elevated K+, 07-29-2016 last date cancelled -- pt visited pcp same day treated w/ kayexelate and K+ came down, pt brought all her medication's in to pcp office and found pt was taking losartan that had been discontinued due to ckd, pcp stated this was cause of elevated K+   History of Mallory-Weiss syndrome    12/ 2002--  resolved   History of rectal abscess    12-29-2004  bedside I & D   Hyperlipidemia    Hypertension    Hypothyroidism    LAFB (left  anterior fascicular block)    Right bundle branch block    Sacral decubitus ulcer    since 2014- 01-02-15 remains with wound" gauze dressing changes daily.   Type II diabetes mellitus ([place])    Past Surgical History:  Procedure Laterality Date   APPLICATION OF A-CELL OF EXTREMITY N/A 04/07/2015   Procedure: A CELL PLACMENT;  Surgeon: Loel Lofty Dillingham, DO;  Location: [place];  Service: Plastics;  Laterality: N/A;   CARDIOVASCULAR STRESS TEST  12-30-2004   normal perfusion study/  no ischemia or infartion/  normal LV wall function and wall motion , ef 66%   CATARACT EXTRACTION W/ INTRAOCULAR LENS  IMPLANT, BILATERAL  1995   COLONOSCOPY  2008   w/Dr.Brodie    COLONOSCOPY W/ POLYPECTOMY  last one 2008   COMPRESSION HIP SCREW Right 05/18/2014   Procedure: COMPRESSION HIP;  Surgeon: Carole Civil, MD;  Location: [facility];  Service: Orthopedics;  Laterality: Right;   ESOPHAGOGASTRODUODENOSCOPY  last one 01-09-2011   EVALUATION UNDER ANESTHESIA WITH FISTULECTOMY N/A 01/06/2015   Procedure: EXAM UNDER ANESTHESIA , placement of seton;  Surgeon: Jackolyn Confer, MD;  Location: [facility];  Service: General;  Laterality: N/A;   I & D EXTREMITY N/A 04/07/2015   Procedure: IRRIGATION AND  DEBRIDEMENT ISCHIAL ULCER;  Surgeon: Loel Lofty Dillingham, DO;  Location: [place];  Service: Plastics;  Laterality: N/A;   INCISION AND DRAINAGE OF WOUND N/A 09/30/2014   Procedure: IRRIGATION AND DEBRIDEMENT SACRAL WOUND, EXCISION OF PERIRECTAL TRACT WITH PLACEMENT OF ACCELL;  Surgeon: Theodoro Kos, DO;  Location: [place];  Service: Plastics;  Laterality: N/A;   LIGATION OF INTERNAL FISTULA TRACT N/A 10/22/2016   Procedure: LIGATION OF INTERNAL FISTULA TRACT;  Surgeon: Leighton Ruff, MD;  Location: [facility];  Service: General;  Laterality: N/A;   ORIF FEMUR FRACTURE Left 10/09/2012   Procedure: OPEN REDUCTION INTERNAL FIXATION (ORIF) DISTAL FEMUR FRACTURE;  Surgeon: Rozanna Box, MD;   Location: [place];  Service: Orthopedics;  Laterality: Left;   RETINOPATHY SURGERY Bilateral 1980's?   TRANSTHORACIC ECHOCARDIOGRAM  02-18-2011   mild LVH,  ef 55-60%,  grade I diastolic dysfunction/  mild TR/  RV systolic pressure increased consistant with moderate pulmonary hypertension   Social History   Socioeconomic History   Marital status: Married    Spouse name: [name]   Number of children: 0   Years of education: 12   Highest education level: Not on file  Occupational History   Occupation: Retired    Fish farm manager: RETIRED  Tobacco Use   Smoking status: Never   Smokeless tobacco: Never  Vaping Use   Vaping Use: Never used  Substance and Sexual Activity   Alcohol use: No   Drug use: No   Sexual activity: Not Currently  Other Topics Concern   Not on file  Social History Narrative   Lives with husband.    Caffeine use: none      01/19/22 No kids, support each other. Spouse diagnosed with cancer recently. She is legally blind but cares for spouse and vice versa. As of 2023 married for 55 years per pt   Social Determinants of Health   Financial Resource Strain: Low Risk  (01/19/2022)   Overall Financial Resource Strain (CARDIA)    Difficulty of Paying Living Expenses: Not very hard  Food Insecurity: No Food Insecurity (01/19/2022)   Hunger Vital Sign    Worried About Running Out of Food in the Last Year: Never true    Ran Out of Food in the Last Year: Never true  Transportation Needs: No Transportation Needs (02/19/2022)   PRAPARE - Hydrologist (Medical): No    Lack of Transportation (Non-Medical): No  Physical Activity: Inactive (04/24/2021)   Exercise Vital Sign    Days of Exercise per Week: 0 days    Minutes of Exercise per Session: 0 min  Stress: No Stress Concern Present (02/19/2022)   [place]    Feeling of Stress : Only a little  Social Connections: Moderately Isolated  (01/19/2022)   Social Connection and Isolation Panel [NHANES]    Frequency of Communication with Friends and Family: More than three times a week    Frequency of Social Gatherings with Friends and Family: More than three times a week    Attends Religious Services: Never    Marine scientist or Organizations: No    Attends Archivist Meetings: Never    Marital Status: Married   Outpatient Encounter Medications as of 04/22/2022  Medication Sig   amLODipine (NORVASC) 5 MG tablet Take 1 tablet (5 mg total) by mouth daily.   atorvastatin (LIPITOR) 10 MG tablet Take 1 tablet (10 mg total)  by mouth daily.   carvedilol (COREG) 12.5 MG tablet TAKE 1 TABLET TWICE DAILY WITH A MEAL   cephALEXin (KEFLEX) 500 MG capsule Take 500 mg by mouth 3 (three) times daily.   Cholecalciferol 25 MCG (1000 UT) capsule Take 1 capsule (1,000 Units total) by mouth daily.   colchicine 0.6 MG tablet Take 0.6 mg by mouth daily as needed.   doxycycline (VIBRAMYCIN) 100 MG capsule Take 100 mg by mouth 2 (two) times daily.   Febuxostat (ULORIC) 80 MG TABS 1 tablet Orally Once a day to manage gout for 30 day(s)   Febuxostat 80 MG TABS Take by mouth.   fluticasone (FLONASE) 50 MCG/ACT nasal spray Place 2 sprays into both nostrils daily. Use 2 sprays in each nostril daily   furosemide (LASIX) 40 MG tablet Take 1 tablet twice weekly   Insulin Glargine (BASAGLAR KWIKPEN) 100 UNIT/ML Inject 10 Units into the skin daily with breakfast.   ketorolac (ACULAR) 0.5 % ophthalmic solution Place 1 drop into the right eye 2 (two) times daily.   Lancets Misc. MISC Use as directed to check blood sugar up to 4x daily. E11.65   levocetirizine (XYZAL) 5 MG tablet Take 1 tablet (5 mg total) by mouth at bedtime as needed for allergies (allergies/ ear fullness).   levofloxacin (LEVAQUIN) 500 MG tablet Take 500 mg by mouth daily.   levothyroxine (SYNTHROID) 75 MCG tablet Take 1 tablet (75 mcg total) by mouth daily before breakfast.    losartan (COZAAR) 100 MG tablet 1 tablet Orally Once a day   meclizine (ANTIVERT) 25 MG tablet Take 0.5-1 tablets (12.5-25 mg total) by mouth 3 (three) times daily as needed for dizziness.   metoprolol succinate (TOPROL-XL) 50 MG 24 hr tablet 1 tablet Orally Once a day   mupirocin ointment (BACTROBAN) 2 % Apply topically as directed.   pantoprazole (PROTONIX) 20 MG tablet Take 1 tablet (20 mg total) by mouth daily.   prednisoLONE acetate (PRED FORTE) 1 % ophthalmic suspension Place 1 drop into the right eye 4 (four) times daily.   silver sulfADIAZINE (SILVADENE) 1 % cream Apply topically as directed.   ULTICARE MICRO PEN NEEDLES 32G X 4 MM MISC TWICE DAILY   [DISCONTINUED] Insulin Glargine (BASAGLAR KWIKPEN) 100 UNIT/ML Inject 8 Units into the skin daily.   No facility-administered encounter medications on file as of 04/22/2022.   ALLERGIES: Allergies  Allergen Reactions   Aspirin Nausea And Vomiting   Ciprofloxacin Other (See Comments)    Upset Stomach   Codeine Nausea And Vomiting    Makes me sick    Hydrocodone-Acetaminophen     Other reaction(s): Unknown   Losartan     Hyperkalemia    Micardis [Telmisartan] Other (See Comments)    unknown   Nexium [Esomeprazole Magnesium] Other (See Comments)    Causes internal bleeding   Niaspan [Niacin Er] Other (See Comments)    Reaction is unknown   Nsaids     Other reaction(s): Unknown   Onglyza [Saxagliptin] Other (See Comments)    Reaction is unknown   Other     No otc pain medications   Oxycodone Hcl     Other reaction(s): Unknown   Rofecoxib Other (See Comments)    unknown   Simvastatin    Tequin [Gatifloxacin] Other (See Comments)    Reaction is unknown   Welchol [Colesevelam Hcl]    Amoxicillin Nausea And Vomiting and Rash    Has patient had a PCN reaction causing immediate rash, facial/tongue/throat swelling, SOB  or lightheadedness with hypotension: Yes Has patient had a PCN reaction causing severe rash involving mucus  membranes or skin necrosis: no  Has patient had a PCN reaction that required hospitalization No Has patient had a PCN reaction occurring within the last 10 years: No If all of the above answers are "NO", then may proceed with Cephalosporin use.    VACCINATION STATUS: Immunization History  Administered Date(s) Administered   COVID-19, mRNA, vaccine(Comirnaty)12 years and older 03/15/2022   Fluad Quad(high Dose 65+) 02/05/2019, 03/05/2020, 03/10/2021, 03/15/2022   Influenza, High Dose Seasonal PF 03/05/2016, 03/14/2017, 03/01/2018   Influenza,inj,Quad PF,6+ Mos 02/16/2013, 02/27/2015   Moderna Covid-19 Vaccine Bivalent Booster 27yr & up 03/10/2021   Moderna Sars-Covid-2 Vaccination 10/14/2020   PFIZER(Purple Top)SARS-COV-2 Vaccination 06/13/2019, 07/04/2019, 03/05/2020   Pneumococcal Conjugate-13 10/11/2014   Pneumococcal Polysaccharide-23 10/10/2012   Tdap 10/06/2012   Zoster Recombinat (Shingrix) 06/02/2021, 08/04/2021    Diabetes She presents for her follow-up diabetic visit. She has type 2 diabetes mellitus. Onset time: She was diagnosed at approximate age of 89years . Her disease course has been stable. There are no hypoglycemic associated symptoms. Pertinent negatives for hypoglycemia include no confusion, headaches, pallor or seizures. Pertinent negatives for diabetes include no chest pain, no fatigue, no polydipsia, no polyphagia and no visual change. There are no hypoglycemic complications. Symptoms are stable. Diabetic complications include nephropathy, peripheral neuropathy, PVD and retinopathy. Risk factors for coronary artery disease include diabetes mellitus, dyslipidemia, hypertension, sedentary lifestyle and post-menopausal. Current diabetic treatment includes oral agent (monotherapy). She is compliant with treatment most of the time. Her weight is fluctuating minimally. She is following a generally unhealthy diet. When asked about meal planning, she reported none. She has not  had a previous visit with a dietitian. She never participates in exercise. Her home blood glucose trend is fluctuating minimally. Her breakfast blood glucose range is generally 130-140 mg/dl. Her bedtime blood glucose range is generally 140-180 mg/dl. Her overall blood glucose range is 140-180 mg/dl. (She received the freestyle libre device.  She presents with her CGM device showing 57% time range, 23% level 1 hyperglycemia, 20% level 2 hyperglycemia.  No hypoglycemia.  Her point-of-care A1c is 7.2%.   ) An ACE inhibitor/angiotensin II receptor blocker is being taken. Eye exam is current.  Thyroid Problem Presents for initial visit. Onset time: 15 years. Patient reports no cold intolerance, diarrhea, fatigue, heat intolerance, palpitations or visual change. The symptoms have been stable. Past treatments include levothyroxine. The following procedures have not been performed: thyroidectomy.  Hypertension This is a chronic problem. The current episode started more than 1 year ago. Pertinent negatives include no chest pain, headaches, palpitations or shortness of breath. Past treatments include angiotensin blockers. Hypertensive end-organ damage includes PVD and retinopathy. Identifiable causes of hypertension include a thyroid problem.    Review of systems  Constitutional: + Minimally fluctuating body weight,  current  Body mass index is 26.89 kg/m. , no fatigue, no subjective hyperthermia, no subjective hypothermia    Objective:    BP 128/60   Pulse 60   Ht _0  (1.575 m)   Wt 147 lb (66.7 kg)   BMI 26.89 kg/m   Wt Readings from Last 3 Encounters:  04/22/22 147 lb (66.7 kg)  03/29/22 159 lb (72.1 kg)  02/17/22 162 lb (73.5 kg)     Physical Exam- Limited  Constitutional:  Body mass index is 26.89 kg/m. , not in acute distress, normal state of mind    Results for orders  placed or performed in visit on 03/29/22  Bayer DCA Hb A1c Waived  Result Value Ref Range   HB A1C (BAYER DCA  - WAIVED) 7.2 (H) 4.8 - 5.6 %  TSH  Result Value Ref Range   TSH 2.360 0.450 - 4.500 uIU/mL  T4, Free  Result Value Ref Range   Free T4 1.54 0.82 - 1.77 ng/dL  Lipid Panel  Result Value Ref Range   Cholesterol, Total 108 100 - 199 mg/dL   Triglycerides 93 0 - 149 mg/dL   HDL 42 >39 mg/dL   VLDL Cholesterol Cal 18 5 - 40 mg/dL   LDL Chol Calc (NIH) 48 0 - 99 mg/dL   Chol/HDL Ratio 2.6 0.0 - 4.4 ratio  Basic Metabolic Panel  Result Value Ref Range   Glucose 179 (H) 70 - 99 mg/dL   BUN 29 (H) 8 - 27 mg/dL   Creatinine, Ser 1.80 (H) 0.57 - 1.00 mg/dL   eGFR 29 (L) >59 mL/min/1.73   BUN/Creatinine Ratio 16 12 - 28   Sodium 141 134 - 144 mmol/L   Potassium 5.0 3.5 - 5.2 mmol/L   Chloride 105 96 - 106 mmol/L   CO2 21 20 - 29 mmol/L   Calcium 9.1 8.7 - 10.3 mg/dL   Diabetic Labs (most recent): Lab Results  Component Value Date   HGBA1C 7.2 (H) 03/29/2022   HGBA1C 7.1 (H) 09/29/2021   HGBA1C 7.5 (H) 04/01/2021   MICROALBUR 100 02/17/2013   Lipid Panel     Component Value Date/Time   CHOL 108 03/29/2022 0836   CHOL 105 09/11/2012 1057   TRIG 93 03/29/2022 0836   TRIG 170 (H) 10/11/2014 1159   TRIG 79 09/11/2012 1057   HDL 42 03/29/2022 0836   HDL 53 10/11/2014 1159   HDL 41 09/11/2012 1057   CHOLHDL 2.6 03/29/2022 0836   LDLCALC 48 03/29/2022 0836   LDLCALC 50 02/14/2014 1012   LDLCALC 48 09/11/2012 1057     Assessment & Plan:   1. Type 2 diabetes mellitus with stage 4 chronic kidney disease, without long-term current use of insulin ([place])  - Patient has currently controlled asymptomatic type 2 DM since  76 years of age.  She received the freestyle libre device.  She presents with her CGM device showing 57% time range, 23% level 1 hyperglycemia, 20% level 2 hyperglycemia.  No hypoglycemia.  Her point-of-care A1c is 7.2%.    -her  diabetes is complicated by PAD, CKD, neuropathy and patient remains at a high risk for more acute and chronic complications of diabetes  which include CAD, CVA, CKD, retinopathy, and neuropathy. These are all discussed in detail with the patient.  - I have counseled the patient on diet management  by adopting a carbohydrate restricted/protein rich diet.  - she acknowledges that there is a room for improvement in her food and drink choices. - Suggestion is made for her to avoid simple carbohydrates  from her diet including Cakes, Sweet Desserts, Ice Cream, Soda (diet and regular), Sweet Tea, Candies, Chips, Cookies, Store Bought Juices, Alcohol in Excess of  1-2 drinks a day, Artificial Sweeteners,  Coffee Creamer, and "Sugar-free" Products, Lemonade. This will help patient to have more stable blood glucose profile and potentially avoid unintended weight gain.  - I encouraged the patient to switch to  unprocessed or minimally processed complex starch and increased protein intake (animal or plant source), fruits, and vegetables.  - Patient is advised to stick to a routine mealtimes  to eat 3 meals  a day and avoid unnecessary snacks ( to snack only to correct hypoglycemia).   - I have approached patient with the following individualized plan to manage diabetes and patient agrees:    -Her husband, Ludwig Clarks , continues to offer to help. -She is advised to increase Basaglar to 10 units every morning with breakfast,  advised to use her CGM to monitor blood glucose at all times.   - First priority in this patient will be to avoid hypoglycemia.   -Patient is encouraged to call clinic for blood glucose levels less than 70 or above 200 mg /dl.  -Patient is not a candidate for MTF, Incretin therapy.  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    - Patient specific target  A1c;  LDL, HDL, Triglycerides,  were discussed in detail.  2) BP/HTN:  -Her blood pressure is controlled to target.  She is advised to continue her current blood pressure medications including amlodipine 5 mg p.o. daily, carvedilol 12.5 mg p.o. twice daily.  She  has  documented allergy to ARB.  3) Lipids/HPL: Her recent lipid panel continues to show improvement in her LDL at 58.she will continue to benefit from statin intervention.  She is advised to continue atorvastatin 10 mg p.o. nightly.  Side effects and precautions discussed with her.     4)  Weight/Diet: Her BMI is  26.89.  She is not a candidate for major weight loss.     CDE Consult has been initiated, she has limited ability to exercise.  5)  Hypothyroidism:  - Her current thyroid function tests are consistent with slight over-replacement.  I discussed and lowered her levothyroxine to 50 mcg p.o. daily before breakfast.     - We discussed about the correct intake of her thyroid hormone, on empty stomach at fasting, with water, separated by at least 30 minutes from breakfast and other medications,  and separated by more than 4 hours from calcium, iron, multivitamins, acid reflux medications (PPIs). -Patient is made aware of the fact that thyroid hormone replacement is needed for life, dose to be adjusted by periodic monitoring of thyroid function tests.    5) Chronic Care/Health Maintenance:  -Patient is  on ACEI/ARB and Statin medications and encouraged to continue to follow up with Ophthalmology, Podiatrist at least yearly or according to recommendations, and advised to   stay away from smoking. I have recommended yearly flu vaccine and pneumonia vaccination at least every 5 years;  and  sleep for at least 7 hours a day.  - I advised patient to maintain close follow up with [name], DO for primary care needs.  I spent 26 minutes in the care of the patient today including review of labs from [place], Lipids, Thyroid Function, Hematology (current and previous including abstractions from other facilities); face-to-face time discussing  her blood glucose readings/logs, discussing hypoglycemia and hyperglycemia episodes and symptoms, medications doses, her options of short and long term  treatment based on the latest standards of care / guidelines;  discussion about incorporating lifestyle medicine;  and documenting the encounter. Risk reduction counseling performed per USPSTF guidelines to reduce cardiovascular risk factors.     Please refer to Patient Instructions for Blood Glucose Monitoring and Insulin/Medications Dosing Guide"  in media tab for additional information. Please  also refer to " Patient Self Inventory" in the Media  tab for reviewed elements of pertinent patient history.  [name] participated in the discussions, expressed understanding,  and voiced agreement with the above plans.  All questions were answered to her satisfaction. she is encouraged to contact clinic should she have any questions or concerns prior to her return visit.     Follow up plan: - Return in about 6 months (around 10/21/2022) for Bring Meter/CGM Device/Logs- A1c in Office.  Glade Lloyd, MD Phone: [phone]  Fax: [phone]  -  This note was partially dictated with voice recognition software. Similar sounding words can be transcribed inadequately or may not  be corrected upon review.  04/22/2022, 2:05 PM

## 2022-04-22 NOTE — Patient Instructions (Signed)

## 2022-04-27 ENCOUNTER — Ambulatory Visit (INDEPENDENT_AMBULATORY_CARE_PROVIDER_SITE_OTHER): Payer: Medicare Other

## 2022-04-27 VITALS — Ht 62.0 in | Wt 146.0 lb

## 2022-04-27 DIAGNOSIS — Z780 Asymptomatic menopausal state: Secondary | ICD-10-CM

## 2022-04-27 DIAGNOSIS — Z0000 Encounter for general adult medical examination without abnormal findings: Secondary | ICD-10-CM | POA: Diagnosis not present

## 2022-04-27 NOTE — Patient Instructions (Signed)
Ms. Bolte , Thank you for taking time to come for your Medicare Wellness Visit. I appreciate your ongoing commitment to your health goals. Please review the following plan we discussed and let me know if I can assist you in the future.   These are the goals we discussed:  Goals       Exercise 3x per week (30 min per time)      Insulin annual medicine assistance [facility]) (pt-stated)      Care Coordination Interventions: Collaborated with pcp and pcp pharmacist  regarding patient's medication application for insulin Informed them of patient intentions to stop the patient completed portion + by the pcp office on 04/21/22 & her request for a timely manner for forms to be completed and faxed to [place] with patient for ongoing care management follow up and provided patient with direct contact information for care management team Outreach & Confirmed patient stopped her forms for Basaglar insulin annual medicine assistance/management by the MD office today 04/21/22 to be completed by the office pharmacist & PCP PCP updated RN CM Will continue to follow up and collaborate with patient, pcp and pharmacist for timely medication assistance for insulin       T2DM PHARMD (pt-stated)      Current Barriers:  Unable to independently monitor therapeutic efficacy Unable to maintain control of BLOOD SUGAR  Pharmacist Clinical Goal(s):  Over the next 90 days, patient will maintain control of blood sugar as evidenced by no hypoglycemia, minimize hyperglycemia  through collaboration with PharmD and provider.   Interventions: 1:1 collaboration with [name], DO regarding development and update of comprehensive plan of care as evidenced by provider attestation and co-signature Inter-disciplinary care team collaboration (see longitudinal plan of care) Comprehensive medication review performed; medication list updated in electronic medical record  Diabetes: Uncontrolled;  current treatment:BASAGLAR 8 UNITS;  Libre 2 CGM system supplied by [employer] via parachute portal Discussed how to troubleshoot and use the LIBRE 2 CGM system--walked patient through set up today, sensor to read in 60 minutes, if sensor falls off, do not replace Encouraged patient to bring CGM to all of her appointments, especially endocrine NO hypoglycemic symptoms OR HYPOGLYCEMIA REPORTED PER LIBRE Patient is eating a light snack before bedtime and denies any hypoglycemia since we last spoke Current exercise: n/a Most recent eGFR: 32 Cardiovascular risk reduction: Current hypertensive regimen:amlodipine, carvedilol, not on ACE/ARB Current hyperlipidemia regimen: atorvastatin Current antiplatelet regimen: n/a  Patient Goals/Self-Care Activities Over the next 90 days, patient will:  - take medications as prescribed check glucose continuously using libre 2, document, and provide at future appointments  Follow Up Plan: Telephone follow up appointment with care management team member scheduled for: 3 WEEKS           This is a list of the screening recommended for you and due dates:  Health Maintenance  Topic Date Due   Eye exam for diabetics  12/26/2019   Yearly kidney health urinalysis for diabetes  05/28/2021   Colon Cancer Screening  03/30/2023*   DEXA scan (bone density measurement)  04/22/2023*   COVID-19 Vaccine (7 - 2023-24 season) 05/10/2022   Hemoglobin A1C  09/27/2022   DTaP/Tdap/Td vaccine (2 - Td or Tdap) 10/07/2022   Yearly kidney function blood test for diabetes  03/30/2023   Complete foot exam   03/30/2023   Medicare Annual Wellness Visit  04/28/2023   Pneumonia Vaccine  Completed   Flu Shot  Completed   Hepatitis  C Screening: USPSTF Recommendation to screen - Ages 60-79 yo.  Completed   Zoster (Shingles) Vaccine  Completed   HPV Vaccine  Aged Out  *Topic was postponed. The date shown is not the original due date.    Advanced directives: Advance  directive discussed with you today. I have provided a copy for you to complete at home and have notarized. Once this is complete please bring a copy in to our office so we can scan it into your chart.   Conditions/risks identified: Aim for 30 minutes of exercise or brisk walking, 6-8 glasses of water, and 5 servings of fruits and vegetables each day.   Next appointment: Follow up in one year for your annual wellness visit    Preventive Care 65 Years and Older, Female Preventive care refers to lifestyle choices and visits with your health care provider that can promote health and wellness. What does preventive care include? A yearly physical exam. This is also called an annual well check. Dental exams once or twice a year. Routine eye exams. Ask your health care provider how often you should have your eyes checked. Personal lifestyle choices, including: Daily care of your teeth and gums. Regular physical activity. Eating a healthy diet. Avoiding tobacco and drug use. Limiting alcohol use. Practicing safe sex. Taking low-dose aspirin every day. Taking vitamin and mineral supplements as recommended by your health care provider. What happens during an annual well check? The services and screenings done by your health care provider during your annual well check will depend on your age, overall health, lifestyle risk factors, and family history of disease. Counseling  Your health care provider may ask you questions about your: Alcohol use. Tobacco use. Drug use. Emotional well-being. Home and relationship well-being. Sexual activity. Eating habits. History of falls. Memory and ability to understand (cognition). Work and work Statistician. Reproductive health. Screening  You may have the following tests or measurements: Height, weight, and BMI. Blood pressure. Lipid and cholesterol levels. These may be checked every 5 years, or more frequently if you are over 24 years old. Skin  check. Lung cancer screening. You may have this screening every year starting at age 63 if you have a 30-pack-year history of smoking and currently smoke or have quit within the past 15 years. Fecal occult blood test (FOBT) of the stool. You may have this test every year starting at age 24. Flexible sigmoidoscopy or colonoscopy. You may have a sigmoidoscopy every 5 years or a colonoscopy every 10 years starting at age 3. Hepatitis C blood test. Hepatitis B blood test. Sexually transmitted disease (STD) testing. Diabetes screening. This is done by checking your blood sugar (glucose) after you have not eaten for a while (fasting). You may have this done every 1-3 years. Bone density scan. This is done to screen for osteoporosis. You may have this done starting at age 9. Mammogram. This may be done every 1-2 years. Talk to your health care provider about how often you should have regular mammograms. Talk with your health care provider about your test results, treatment options, and if necessary, the need for more tests. Vaccines  Your health care provider may recommend certain vaccines, such as: Influenza vaccine. This is recommended every year. Tetanus, diphtheria, and acellular pertussis (Tdap, Td) vaccine. You may need a Td booster every 10 years. Zoster vaccine. You may need this after age 42. Pneumococcal 13-valent conjugate (PCV13) vaccine. One dose is recommended after age 59. Pneumococcal polysaccharide (PPSV23) vaccine. One dose is  recommended after age 78. Talk to your health care provider about which screenings and vaccines you need and how often you need them. This information is not intended to replace advice given to you by your health care provider. Make sure you discuss any questions you have with your health care provider. Document Released: 06/06/2015 Document Revised: 01/28/2016 Document Reviewed: 03/11/2015 Elsevier Interactive Patient Education  2017 [place]  Prevention in the Home Falls can cause injuries. They can happen to people of all ages. There are many things you can do to make your home safe and to help prevent falls. What can I do on the outside of my home? Regularly fix the edges of walkways and driveways and fix any cracks. Remove anything that might make you trip as you walk through a door, such as a raised step or threshold. Trim any bushes or trees on the path to your home. Use bright outdoor lighting. Clear any walking paths of anything that might make someone trip, such as rocks or tools. Regularly check to see if handrails are loose or broken. Make sure that both sides of any steps have handrails. Any raised decks and porches should have guardrails on the edges. Have any leaves, snow, or ice cleared regularly. Use sand or salt on walking paths during winter. Clean up any spills in your garage right away. This includes oil or grease spills. What can I do in the bathroom? Use night lights. Install grab bars by the toilet and in the tub and shower. Do not use towel bars as grab bars. Use non-skid mats or decals in the tub or shower. If you need to sit down in the shower, use a plastic, non-slip stool. Keep the floor dry. Clean up any water that spills on the floor as soon as it happens. Remove soap buildup in the tub or shower regularly. Attach bath mats securely with double-sided non-slip rug tape. Do not have throw rugs and other things on the floor that can make you trip. What can I do in the bedroom? Use night lights. Make sure that you have a light by your bed that is easy to reach. Do not use any sheets or blankets that are too big for your bed. They should not hang down onto the floor. Have a firm chair that has side arms. You can use this for support while you get dressed. Do not have throw rugs and other things on the floor that can make you trip. What can I do in the kitchen? Clean up any spills right away. Avoid  walking on wet floors. Keep items that you use a lot in easy-to-reach places. If you need to reach something above you, use a strong step stool that has a grab bar. Keep electrical cords out of the way. Do not use floor polish or wax that makes floors slippery. If you must use wax, use non-skid floor wax. Do not have throw rugs and other things on the floor that can make you trip. What can I do with my stairs? Do not leave any items on the stairs. Make sure that there are handrails on both sides of the stairs and use them. Fix handrails that are broken or loose. Make sure that handrails are as long as the stairways. Check any carpeting to make sure that it is firmly attached to the stairs. Fix any carpet that is loose or worn. Avoid having throw rugs at the top or bottom of the stairs. If  you do have throw rugs, attach them to the floor with carpet tape. Make sure that you have a light switch at the top of the stairs and the bottom of the stairs. If you do not have them, ask someone to add them for you. What else can I do to help prevent falls? Wear shoes that: Do not have high heels. Have rubber bottoms. Are comfortable and fit you well. Are closed at the toe. Do not wear sandals. If you use a stepladder: Make sure that it is fully opened. Do not climb a closed stepladder. Make sure that both sides of the stepladder are locked into place. Ask someone to hold it for you, if possible. Clearly mark and make sure that you can see: Any grab bars or handrails. First and last steps. Where the edge of each step is. Use tools that help you move around (mobility aids) if they are needed. These include: Canes. Walkers. Scooters. Crutches. Turn on the lights when you go into a dark area. Replace any light bulbs as soon as they burn out. Set up your furniture so you have a clear path. Avoid moving your furniture around. If any of your floors are uneven, fix them. If there are any pets around  you, be aware of where they are. Review your medicines with your doctor. Some medicines can make you feel dizzy. This can increase your chance of falling. Ask your doctor what other things that you can do to help prevent falls. This information is not intended to replace advice given to you by your health care provider. Make sure you discuss any questions you have with your health care provider. Document Released: 03/06/2009 Document Revised: 10/16/2015 Document Reviewed: 06/14/2014 Elsevier Interactive Patient Education  2017 [employer].

## 2022-04-27 NOTE — Progress Notes (Signed)
Subjective:   [name] is a 76 y.o. female who presents for Medicare Annual (Subsequent) preventive examination. I connected with  [name] on 04/27/22 by a audio enabled telemedicine application and verified that I am speaking with the correct person using two identifiers.  Patient Location: Home  Provider Location: Home Office  I discussed the limitations of evaluation and management by telemedicine. The patient expressed understanding and agreed to proceed.  Review of Systems     Cardiac Risk Factors include: advanced age (>64mn, >>46women);diabetes mellitus;hypertension;dyslipidemia     Objective:    Today's Vitals   04/27/22 1403  Weight: 146 lb (66.2 kg)  Height: '5\' 2"'$  (1.575 m)   Body mass index is 26.7 kg/m.     04/27/2022    2:07 PM 01/05/2022   12:13 PM 04/03/2021    3:34 PM 05/01/2019    4:00 PM 10/12/2017    9:25 AM 06/28/2017    3:04 PM 06/24/2017    1:19 PM  Advanced Directives  Does Patient Have a Medical Advance Directive? No No No No No  Yes  Type of Advance Directive       [place] Does patient want to make changes to medical advance directive?    No - Patient declined  No - Patient declined No - Patient declined  Would patient like information on creating a medical advance directive? No - Patient declined No - Patient declined No - Patient declined  No - Patient declined      Current Medications (verified) Outpatient Encounter Medications as of 04/27/2022  Medication Sig   amLODipine (NORVASC) 5 MG tablet Take 1 tablet (5 mg total) by mouth daily.   atorvastatin (LIPITOR) 10 MG tablet Take 1 tablet (10 mg total) by mouth daily.   carvedilol (COREG) 12.5 MG tablet TAKE 1 TABLET TWICE DAILY WITH A MEAL   cephALEXin (KEFLEX) 500 MG capsule Take 500 mg by mouth 3 (three) times daily.   Cholecalciferol 25 MCG (1000 UT) capsule Take 1 capsule (1,000 Units total) by mouth daily.   colchicine 0.6 MG tablet Take 0.6 mg by mouth  daily as needed.   doxycycline (VIBRAMYCIN) 100 MG capsule Take 100 mg by mouth 2 (two) times daily.   Febuxostat (ULORIC) 80 MG TABS 1 tablet Orally Once a day to manage gout for 30 day(s)   Febuxostat 80 MG TABS Take by mouth.   fluticasone (FLONASE) 50 MCG/ACT nasal spray Place 2 sprays into both nostrils daily. Use 2 sprays in each nostril daily   furosemide (LASIX) 40 MG tablet Take 1 tablet twice weekly   Insulin Glargine (BASAGLAR KWIKPEN) 100 UNIT/ML Inject 10 Units into the skin daily with breakfast.   ketorolac (ACULAR) 0.5 % ophthalmic solution Place 1 drop into the right eye 2 (two) times daily.   Lancets Misc. MISC Use as directed to check blood sugar up to 4x daily. E11.65   levocetirizine (XYZAL) 5 MG tablet Take 1 tablet (5 mg total) by mouth at bedtime as needed for allergies (allergies/ ear fullness).   levofloxacin (LEVAQUIN) 500 MG tablet Take 500 mg by mouth daily.   levothyroxine (SYNTHROID) 75 MCG tablet Take 1 tablet (75 mcg total) by mouth daily before breakfast.   losartan (COZAAR) 100 MG tablet 1 tablet Orally Once a day   meclizine (ANTIVERT) 25 MG tablet Take 0.5-1 tablets (12.5-25 mg total) by mouth 3 (three) times daily as needed for dizziness.   metoprolol succinate (TOPROL-XL)  50 MG 24 hr tablet 1 tablet Orally Once a day   mupirocin ointment (BACTROBAN) 2 % Apply topically as directed.   pantoprazole (PROTONIX) 20 MG tablet Take 1 tablet (20 mg total) by mouth daily.   prednisoLONE acetate (PRED FORTE) 1 % ophthalmic suspension Place 1 drop into the right eye 4 (four) times daily.   silver sulfADIAZINE (SILVADENE) 1 % cream Apply topically as directed.   ULTICARE MICRO PEN NEEDLES 32G X 4 MM MISC TWICE DAILY   No facility-administered encounter medications on file as of 04/27/2022.    Allergies (verified) Aspirin, Ciprofloxacin, Codeine, Hydrocodone-acetaminophen, Losartan, Micardis [telmisartan], Nexium [esomeprazole magnesium], Niaspan [niacin er], Nsaids,  Onglyza [saxagliptin], Other, Oxycodone hcl, Rofecoxib, Simvastatin, Tequin [gatifloxacin], Welchol [colesevelam hcl], and Amoxicillin   History: Past Medical History:  Diagnosis Date   Anal fistula    Anemia in chronic renal disease    Aranesp injection --  when Hg <11, last injection 12-24-14.   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    CKD (chronic kidney disease), stage III [facility])    nephrologist--  dr Mercy Moore-- [place]  [phone]   Complication of anesthesia    post-op confusion    Constipation    Coronary artery disease    Diabetic gastroparesis ([place])    Diabetic retinopathy ([place])    bilateral --  monitored by dr [name]   Diverticulosis of colon    GAD (generalized anxiety disorder)    GERD (gastroesophageal reflux disease)    Hip fracture requiring operative repair [facility])    History of colon polyps    benign   History of esophagitis    History of GI bleed    upper 2009  due to esophagitis  &  2002  due to Mallory-Weiss tear   History of hyperkalemia    pt had previously been canceled twice dos due to elevated K+, 07-29-2016 last date cancelled -- pt visited pcp same day treated w/ kayexelate and K+ came down, pt brought all her medication's in to pcp office and found pt was taking losartan that had been discontinued due to ckd, pcp stated this was cause of elevated K+   History of Mallory-Weiss syndrome    12/ 2002--  resolved   History of rectal abscess    12-29-2004  bedside I & D   Hyperlipidemia    Hypertension    Hypothyroidism    LAFB (left anterior fascicular block)    Right bundle branch block    Sacral decubitus ulcer    since 2014- 01-02-15 remains  with wound" gauze dressing changes daily.   Type II diabetes mellitus ([place])    Past Surgical History:  Procedure Laterality Date   APPLICATION OF A-CELL OF EXTREMITY N/A 04/07/2015   Procedure: A CELL PLACMENT;  Surgeon: Loel Lofty Dillingham, DO;  Location: [place];  Service: Plastics;  Laterality: N/A;   CARDIOVASCULAR STRESS TEST  12-30-2004   normal perfusion study/  no ischemia or infartion/  normal LV wall function and wall motion , ef 66%   CATARACT EXTRACTION W/ INTRAOCULAR LENS  IMPLANT, BILATERAL  1995   COLONOSCOPY  2008   w/Dr.Brodie  COLONOSCOPY W/ POLYPECTOMY  last one 2008   COMPRESSION HIP SCREW Right 05/18/2014   Procedure: COMPRESSION HIP;  Surgeon: Carole Civil, MD;  Location: [facility];  Service: Orthopedics;  Laterality: Right;   ESOPHAGOGASTRODUODENOSCOPY  last one 01-09-2011   EVALUATION UNDER ANESTHESIA WITH FISTULECTOMY N/A 01/06/2015   Procedure: EXAM UNDER ANESTHESIA , placement of seton;  Surgeon: Jackolyn Confer, MD;  Location: [facility];  Service: General;  Laterality: N/A;   I & D EXTREMITY N/A 04/07/2015   Procedure: IRRIGATION AND DEBRIDEMENT ISCHIAL ULCER;  Surgeon: Loel Lofty Dillingham, DO;  Location: [place];  Service: Plastics;  Laterality: N/A;   INCISION AND DRAINAGE OF WOUND N/A 09/30/2014   Procedure: IRRIGATION AND DEBRIDEMENT SACRAL WOUND, EXCISION OF PERIRECTAL TRACT WITH PLACEMENT OF ACCELL;  Surgeon: Theodoro Kos, DO;  Location: [place];  Service: Plastics;  Laterality: N/A;   LIGATION OF INTERNAL FISTULA TRACT N/A 10/22/2016   Procedure: LIGATION OF INTERNAL FISTULA TRACT;  Surgeon: Leighton Ruff, MD;  Location: [facility];  Service: General;  Laterality: N/A;   ORIF FEMUR FRACTURE Left 10/09/2012   Procedure: OPEN REDUCTION INTERNAL FIXATION (ORIF) DISTAL FEMUR FRACTURE;  Surgeon: Rozanna Box, MD;  Location: [place];  Service: Orthopedics;  Laterality: Left;   RETINOPATHY SURGERY Bilateral 1980's?   TRANSTHORACIC  ECHOCARDIOGRAM  02-18-2011   mild LVH,  ef 55-60%,  grade I diastolic dysfunction/  mild TR/  RV systolic pressure increased consistant with moderate pulmonary hypertension   Family History  Problem Relation Age of Onset   Colon cancer Father 33   Prostate cancer Father    Diabetes Father    Coronary artery disease Mother 54   Heart disease Mother    Diabetes Mother    Coronary artery disease Sister 58   Diabetes Sister    Heart disease Sister    Diabetes Sister    Diabetes Sister    Diabetes Sister    Diabetes Sister    Diabetes Sister    Diabetes Maternal Grandmother    Esophageal cancer Neg Hx    Stomach cancer Neg Hx    Rectal cancer Neg Hx    Social History   Socioeconomic History   Marital status: Married    Spouse name: [name]   Number of children: 0   Years of education: 12   Highest education level: Not on file  Occupational History   Occupation: Retired    Fish farm manager: RETIRED  Tobacco Use   Smoking status: Never   Smokeless tobacco: Never  Vaping Use   Vaping Use: Never used  Substance and Sexual Activity   Alcohol use: No   Drug use: No   Sexual activity: Not Currently  Other Topics Concern   Not on file  Social History Narrative   Lives with husband.    Caffeine use: none      01/19/22 No kids, support each other. Spouse diagnosed with cancer recently. She is legally blind but cares for spouse and vice versa. As of 2023 married for 55 years per pt   Social Determinants of Health   Financial Resource Strain: Low Risk  (04/27/2022)   Overall Financial Resource Strain (CARDIA)    Difficulty of Paying Living Expenses: Not hard at all  Food Insecurity: No Food Insecurity (04/27/2022)   Hunger Vital Sign    Worried About Running Out of Food in the Last Year: Never true    Ran Out of Food in the Last Year: Never true  Transportation Needs: No Transportation Needs (04/27/2022)   PRAPARE - Hydrologist (Medical): No    Lack of  Transportation (Non-Medical): No  Physical Activity: Insufficiently Active (04/27/2022)   Exercise Vital Sign    Days of Exercise per Week: 3 days    Minutes of Exercise per Session: 30 min  Stress: No Stress Concern Present (04/27/2022)   [place]    Feeling of Stress : Not at all  Social Connections: Moderately Integrated (04/27/2022)   Social Connection and Isolation Panel [NHANES]    Frequency of Communication with Friends and Family: More than three times a week    Frequency of Social Gatherings with Friends and Family: More than three times a week    Attends Religious Services: 1 to 4 times per year    Active Member of [employer] or Organizations: No    Attends Music therapist: Never    Marital Status: Married    Tobacco Counseling Counseling given: Not Answered   Clinical Intake:  Pre-visit preparation completed: Yes  Pain : No/denies pain     Nutritional Risks: None Diabetes: Yes CBG done?: No Did pt. bring in CBG monitor from home?: No  How often do you need to have someone help you when you read instructions, pamphlets, or other written materials from your doctor or pharmacy?: 1 - Never  Diabetic?yes Nutrition Risk Assessment:  Has the patient had any N/V/D within the last 2 months?  No  Does the patient have any non-healing wounds?  No  Has the patient had any unintentional weight loss or weight gain?  No   Diabetes:  Is the patient diabetic?  Yes  If diabetic, was a CBG obtained today?  No  Did the patient bring in their glucometer from home?  No  How often do you monitor your CBG's? 3 x day .   Financial Strains and Diabetes Management:  Are you having any financial strains with the device, your supplies or your medication? No .  Does the patient want to be seen by Chronic Care Management for management of their diabetes?  No  Would the patient like to be referred to a  Nutritionist or for Diabetic Management?  No   Diabetic Exams:  Diabetic Eye Exam: Completed 02/2022 Diabetic Foot Exam: Overdue, Pt has been advised about the importance in completing this exam. Pt is scheduled for diabetic foot exam on next office visit .   Interpreter Needed?: No  Information entered by :: Jadene Pierini, LPN   Activities of Daily Living    04/27/2022    2:08 PM  In your present state of health, do you have any difficulty performing the following activities:  Hearing? 0  Vision? 0  Difficulty concentrating or making decisions? 0  Walking or climbing stairs? 0  Dressing or bathing? 0  Doing errands, shopping? 0  Preparing Food and eating ? N  Using the Toilet? N  In the past six months, have you accidently leaked urine? N  Do you have problems with loss of bowel control? N  Managing your Medications? N  Managing your Finances? N  Housekeeping or managing your Housekeeping? N    Patient Care Team: [name], DO as PCP - General (Family Medicine) [name] Clent Demark, MD as Consulting Physician (Ophthalmology) Fleet Contras, MD as Consulting Physician (Nephrology) Minus Breeding, MD as Consulting Physician (Cardiology) Lafayette Dragon, MD (Inactive) as Consulting Physician (Gastroenterology)  Katha Cabal, LCSW as [place] Management (Licensed Clinical Social Worker) P[place]itt, Royce Macadamia, [facility] (Pharmacist) Barbaraann Faster, RN as [place], Marella Chimes, MD as Consulting Physician (Endocrinology) Corliss Parish, MD as Consulting Physician (Nephrology)  Indicate any recent [place] you may have received from other than [facility] providers in the past year (date may be approximate).     Assessment:   This is a routine wellness examination for [place].  Hearing/Vision screen Vision Screening - Comments:: Wears rx glasses - up to date with routine eye exams with  Dr,Rankin    Dietary issues and exercise activities discussed: Current Exercise Habits: Home exercise routine, Type of exercise: walking, Time (Minutes): 30, Frequency (Times/Week): 5, Weekly Exercise (Minutes/Week): 150, Intensity: Mild, Exercise limited by: orthopedic condition(s)   Goals Addressed             This Visit's Progress    Exercise 3x per week (30 min per time)         Depression Screen    04/27/2022    2:06 PM 03/29/2022    8:04 AM 02/19/2022   11:48 AM 01/19/2022   12:01 PM 01/05/2022   11:52 AM 09/29/2021    8:25 AM 04/24/2021    5:11 PM  PHQ 2/9 Scores  PHQ - 2 Score 0 0 0 0 0 0 2  PHQ- 9 Score       4    Fall Risk    04/27/2022    2:04 PM 03/29/2022    8:04 AM 09/29/2021    8:24 AM 04/03/2021    3:34 PM 04/01/2021    8:10 AM  Fall Risk   Falls in the past year? 0 0 0 0 0  Number falls in past yr: 0   0   Injury with Fall? 0   0   Risk for fall due to : No Fall Risks   Impaired balance/gait;History of fall(s);Impaired vision   Follow up Falls prevention discussed   Falls prevention discussed;Education provided     FALL RISK PREVENTION PERTAINING TO THE HOME:  Any stairs in or around the home? No  If so, are there any without handrails? No  Home free of loose throw rugs in walkways, pet beds, electrical cords, etc? Yes  Adequate lighting in your home to reduce risk of falls? Yes   ASSISTIVE DEVICES UTILIZED TO PREVENT FALLS:  Life alert? No  Use of a cane, walker or w/c? Yes  Grab bars in the bathroom? Yes  Shower chair or bench in shower? Yes  Elevated toilet seat or a handicapped toilet? Yes          04/03/2021    3:38 PM  6CIT Screen  What Year? 0 points  What month? 0 points  What time? 0 points  Count back from 20 0 points  Months in reverse 2 points  Repeat phrase 2 points  Total Score 4 points    Immunizations Immunization History  Administered Date(s) Administered   COVID-19, mRNA, vaccine(Comirnaty)12 years and older 03/15/2022   Fluad  Quad(high Dose 65+) 02/05/2019, 03/05/2020, 03/10/2021, 03/15/2022   Influenza, High Dose Seasonal PF 03/05/2016, 03/14/2017, 03/01/2018   Influenza,inj,Quad PF,6+ Mos 02/16/2013, 02/27/2015   Moderna Covid-19 Vaccine Bivalent Booster 55yr & up 03/10/2021   Moderna Sars-Covid-2 Vaccination 10/14/2020   PFIZER(Purple Top)SARS-COV-2 Vaccination 06/13/2019, 07/04/2019, 03/05/2020   Pneumococcal Conjugate-13 10/11/2014   Pneumococcal Polysaccharide-23 10/10/2012   Tdap 10/06/2012   Zoster Recombinat (Shingrix) 06/02/2021,  08/04/2021    TDAP status: Up to date  Flu Vaccine status: Up to date  Pneumococcal vaccine status: Up to date  Covid-19 vaccine status: Completed vaccines  Qualifies for Shingles Vaccine? Yes   Zostavax completed Yes   Shingrix Completed?: Yes  Screening Tests Health Maintenance  Topic Date Due   OPHTHALMOLOGY EXAM  12/26/2019   Diabetic kidney evaluation - Urine ACR  05/28/2021   COLONOSCOPY (Pts 45-74yr Insurance coverage will need to be confirmed)  03/30/2023 (Originally 11/15/2021)   DEXA SCAN  04/22/2023 (Originally 10/28/2019)   COVID-19 Vaccine (7 - 2023-24 season) 05/10/2022   HEMOGLOBIN A1C  09/27/2022   DTaP/Tdap/Td (2 - Td or Tdap) 10/07/2022   Diabetic kidney evaluation - GFR measurement  03/30/2023   FOOT EXAM  03/30/2023   Medicare Annual Wellness (AWV)  04/28/2023   Pneumonia Vaccine 76 Years old  Completed   INFLUENZA VACCINE  Completed   Hepatitis C Screening  Completed   Zoster Vaccines- Shingrix  Completed   HPV VACCINES  Aged Out    Health Maintenance  Health Maintenance Due  Topic Date Due   OPHTHALMOLOGY EXAM  12/26/2019   Diabetic kidney evaluation - Urine ACR  05/28/2021    Colorectal cancer screening: No longer required.   Mammogram status: No longer required due to age.  Bone Density status: Ordered 04/27/2022. Pt provided with contact info and advised to call to schedule appt.  Lung Cancer Screening: (Low Dose CT  Chest recommended if Age 76-80years, 30 pack-year currently smoking OR have quit w/in 15years.) does not qualify.   Lung Cancer Screening Referral: n/a  Additional Screening:  Hepatitis C Screening: does not qualify; Completed 06/03/2015  Vision Screening: Recommended annual ophthalmology exams for early detection of glaucoma and other disorders of the eye. Is the patient up to date with their annual eye exam?  Yes  Who is the provider or what is the name of the office in which the patient attends annual eye exams? Dr.Rankin  If pt is not established with a provider, would they like to be referred to a provider to establish care? No .   Dental Screening: Recommended annual dental exams for proper oral hygiene  Community Resource Referral / Chronic Care Management: CRR required this visit?  No   CCM required this visit?  No      Plan:     I have personally reviewed and noted the following in the patient's chart:   Medical and social history Use of alcohol, tobacco or illicit drugs  Current medications and supplements including opioid prescriptions. Patient is not currently taking opioid prescriptions. Functional ability and status Nutritional status Physical activity Advanced directives List of other physicians Hospitalizations, surgeries, and ER visits in previous 12 months Vitals Screenings to include cognitive, depression, and falls Referrals and appointments  In addition, I have reviewed and discussed with patient certain preventive protocols, quality metrics, and best practice recommendations. A written personalized care plan for preventive services as well as general preventive health recommendations were provided to patient.     LDaphane Shepherd LPN   [id number]  Nurse Notes: Referral Dexa Scan 04/27/2022

## 2022-04-28 ENCOUNTER — Telehealth: Payer: Self-pay

## 2022-04-28 ENCOUNTER — Ambulatory Visit: Payer: Self-pay | Admitting: *Deleted

## 2022-04-28 DIAGNOSIS — N183 Chronic kidney disease, stage 3 unspecified: Secondary | ICD-10-CM | POA: Diagnosis present

## 2022-04-28 NOTE — Telephone Encounter (Signed)
-----   Message from Barbaraann Faster, RN sent at 04/28/2022  3:34 PM EST ----- Regarding: Lilly Insulin application Hello  Do we know if this has been faxed to [place]. Lavina Hamman, RN, BSN, [place] Coordinator Office number [phone]

## 2022-04-28 NOTE — Telephone Encounter (Signed)
[name] was working on this

## 2022-04-28 NOTE — Patient Outreach (Signed)
  Care Coordination   Follow Up Visit Note   04/28/2022 Name: [name] MRN: [id number] DOB: 01-22-1946  [name] is a 76 y.o. year old female who sees [name] Norlander, DO for primary care. I spoke with  [name] by phone today.  What matters to the patients health and wellness today?  Follow up on [name] insulin application after her medicare annual visit on 04/27/22   Goals Addressed               This Visit's Progress     Patient Stated     Insulin annual medicine assistance [facility]) (pt-stated)        Care Coordination Interventions: Discussed plans with patient for ongoing care management follow up and provided patient with direct contact information for care management team Outreach to follow up with patient - Medication assistance Teacher, early years/pre  Message sent to PCP RN [name] and [name], pharmacy inquiring if [name] forms faxed to foundation  Will continue to follow up and collaborate with patient, pcp and pharmacist for timely medication assistance for insulin         SDOH assessments and interventions completed:  No     Care Coordination Interventions:  Yes, provided   Follow up plan: Follow up call scheduled for 05/05/22    Encounter Outcome:  Pt. Visit Completed   [name] L. Lavina Hamman, RN, BSN, [place] Coordinator Office number [phone]

## 2022-04-28 NOTE — Patient Instructions (Signed)
Visit Information  Thank you for taking time to visit with me today. Please don't hesitate to contact me if I can be of assistance to you.   Following are the goals we discussed today:   Goals Addressed               This Visit's Progress     Patient Stated     Insulin annual medicine assistance [facility]) (pt-stated)        Care Coordination Interventions: Discussed plans with patient for ongoing care management follow up and provided patient with direct contact information for care management team Outreach to follow up with patient - Medication assistance Teacher, early years/pre  Message sent to PCP RN Paulita Fujita and Mariam Dollar, pharmacy inquiring if lilly forms faxed to foundation  Will continue to follow up and collaborate with patient, pcp and pharmacist for timely medication assistance for insulin         Our next appointment is by telephone on 05/05/22 at 3 pm  Please call the care guide team at [phone] if you need to cancel or reschedule your appointment.   If you are experiencing a Mental Health or [place] or need someone to talk to, please call the Suicide and Crisis Lifeline: 988 call the [place] National Suicide Prevention Lifeline: 4082064848 or TTY: 715-760-0164 TTY 289-803-5186) to talk to a trained counselor call 1-800-273-TALK (toll free, 24 hour hotline) call the [facility]: 717-707-9289 call 911   The patient verbalized understanding of instructions, educational materials, and care plan provided today and DECLINED offer to receive copy of patient instructions, educational materials, and care plan.   The patient has been provided with contact information for the care management team and has been advised to call with any health related questions or concerns.   Floyce Bujak L. Lavina Hamman, RN, BSN, [place] Coordinator Office number [phone]

## 2022-05-04 ENCOUNTER — Telehealth: Payer: Self-pay

## 2022-05-04 NOTE — Telephone Encounter (Signed)
-----   Message from Lavera Guise, [facility] sent at 05/04/2022  9:26 AM EST ----- Regarding: RE: Lilly Insulin application It was faxed last week Please have patient call [phone] to check on the status in 3-5 business days We have over 500 patients enrolled in patient assistance so it is a challenging time of year for [place] to get them all in I think she also sees an endocrinologist that may have samples We appreciate her patience!  ----- Message ----- From: Barbaraann Faster, RN Sent: 04/28/2022   3:38 PM EST To: Lavera Guise, [place]; Everlean Cherry, CMA Subject: Lilly Insulin application                      Hello  Do we know if this has been faxed to [employer]. Lavina Hamman, RN, BSN, [place] Coordinator Office number [phone]

## 2022-05-04 NOTE — Telephone Encounter (Signed)
PATIENT AWARE TO CALL BACK WHEN HUSBAND IS HOME TO [place] PHONE NUMBER

## 2022-05-05 ENCOUNTER — Ambulatory Visit: Payer: Self-pay | Admitting: *Deleted

## 2022-05-05 NOTE — Patient Outreach (Addendum)
  Care Coordination   Follow Up Visit Note   05/05/2022 Name: [name] MRN: [id number] DOB: [birth year]  [name] is a 76 y.o. year old female who sees [name], DO for primary care. I spoke with  [name] by phone today.  What matters to the patients health and wellness today?  Medication assistance for insulin [name] confirms she has not received a call from [employer] but she has called them and was informed there is not an application for her on file  [name] also spoke with Yetta Flock, pcp nurse on 05/04/22 She requests RN CM outreach to [employer] on her behalf to see if they inform RN CM that the application has not arrived She prefers at this late date in December 2023 not to request assistance from her endocrinologist She shares that she doubts her endocrinologist would be able to assist with samples   Goals Addressed               This Visit's Progress     Patient Stated     Insulin annual medicine assistance [facility]) (pt-stated)   Not on track     Care Coordination Interventions: Discussed plans with patient for ongoing care management follow up and provided patient with direct contact information for care management team Outreach to follow up with patient - Medication assistance [employer] received [name], pharmacy staff on 04/1222 stating the patient's application was faxed on "last week" Discussed this with [name] Confirmed with [name] that she also called [employer] and was informed the application had not been received She also states she spoke with pcp nurse on 05/04/22 about what she was informed by [employer] staff Offered to work with her endocrinologist's office on this insulin request and discussed the pharmacist recommendation to discuss samples with the endocrinologist.  Outreach to the [employer] and spoke with [place] related to the patient's application  He was not able to find the application. He is requesting the application be re faxed to requesting the application be re faxed to [phone] Message sent to [name] and A Compton requesting the application be re faxed Will continue to follow up and collaborate with patient, pcp and pharmacist for timely medication assistance for insulin         SDOH assessments and interventions completed:  No     Care Coordination Interventions:  Yes, provided   Follow up plan: Follow up call scheduled for 05/12/22    Encounter Outcome:  Pt. Visit Completed   Odyn Turko L. Lavina Hamman, RN, BSN, [place] Coordinator Office number [phone]

## 2022-05-05 NOTE — Patient Instructions (Addendum)
Visit Information  Thank you for taking time to visit with me today. Please don't hesitate to contact me if I can be of assistance to you.   Following are the goals we discussed today:   Goals Addressed               This Visit's Progress     Patient Stated     Insulin annual medicine assistance [facility]) (pt-stated)   Not on track     Care Coordination Interventions: Discussed plans with patient for ongoing care management follow up and provided patient with direct contact information for care management team Outreach to follow up with patient - Medication assistance [employer] received Mariam Dollar, pharmacy staff on 04/1222 stating the patient's application was faxed on "last week" Discussed this with Mrs Starace Confirmed with Mrs Flammia that she also called [employer] and was informed the application had not been received She also states she spoke with pcp nurse on 05/04/22 about what she was informed by [employer] staff Offered to work with her endocrinologist's office on this insulin request and discussed the pharmacist recommendation to discuss samples with the endocrinologist.  Outreach to the [employer] and spoke with [place] related to the patient's application He was not able to find the application. He is requesting the application be re faxed to requesting the application be re faxed to [phone] Message sent to Mariam Dollar and A Compton requesting the application be re faxed Will continue to follow up and collaborate with patient, pcp and pharmacist for timely medication assistance for insulin         Our next appointment is by telephone on 05/12/22 at 2 pm   Please call the care guide team at [phone] if you need to cancel or reschedule your appointment.   If you are experiencing a Mental Health or [place] or need someone to talk to, please call the Suicide and Crisis Lifeline: 988 call the [place] National  Suicide Prevention Lifeline: 9868395149 or TTY: (302) 887-7408 TTY 651 266 0891) to talk to a trained counselor call 1-800-273-TALK (toll free, 24 hour hotline) call the [facility]: 816-660-7779 call 911   The patient verbalized understanding of instructions, educational materials, and care plan provided today and DECLINED offer to receive copy of patient instructions, educational materials, and care plan.   The patient has been provided with contact information for the care management team and has been advised to call with any health related questions or concerns.   Bonnie Roig L. Lavina Hamman, RN, BSN, [place] Coordinator Office number [phone]

## 2022-05-06 ENCOUNTER — Telehealth: Payer: Self-pay

## 2022-05-06 NOTE — Telephone Encounter (Signed)
-----   Message from Barbaraann Faster, RN sent at 05/05/2022  3:21 PM EST ----- Regarding: RE: Lilly Insulin application Hello Ladies Unfortunately [employer] they could not find nor received the application Earnest Conroy is requesting the application be re faxed to [place]. Lavina Hamman, RN, BSN, [place] Coordinator Office number [phone]    ----- Message ----- From: Lavera Guise, [facility] Sent: 05/04/2022   9:30 AM EST To: Barbaraann Faster, RN; Everlean Cherry, CMA Subject: RE: Ralph Leyden Insulin application                  It was faxed last week Please have patient call [phone] to check on the status in 3-5 business days We have over 500 patients enrolled in patient assistance so it is a challenging time of year for [place] to get them all in I think she also sees an endocrinologist that may have samples We appreciate her patience!  ----- Message ----- From: Barbaraann Faster, RN Sent: 04/28/2022   3:38 PM EST To: Lavera Guise, [place]; Everlean Cherry, CMA Subject: Lilly Insulin application                      Hello  Do we know if this has been faxed to [employer]. Lavina Hamman, RN, BSN, [place] Coordinator Office number [phone]

## 2022-05-06 NOTE — Telephone Encounter (Signed)
This was completed on 12/12

## 2022-05-12 ENCOUNTER — Encounter: Payer: Self-pay | Admitting: *Deleted

## 2022-05-12 ENCOUNTER — Ambulatory Visit: Payer: Self-pay | Admitting: *Deleted

## 2022-05-12 NOTE — Patient Outreach (Signed)
  Care Coordination   Follow Up Visit Note   Late entry for 05/12/22 05/20/2022 Name: [name] MRN: [id number] DOB: [birth year]  [name] is a 76 y.o. year old female who sees [name] Norlander, DO for primary care. I spoke with  [name] by phone today.  What matters to the patients health and wellness today?  Lilly Care Application for Insulin for 2024   Lilly care automated system indicates 2 applications were received The one on May 07 2022 was incomplete without the patient signature.  The one sent on May 11 2022 was approved and the patient should be receiving a letter soon   [name] voiced understanding and appreciation for the conference call with her to confirm her application was received, approved and the indication that she will receive a letter soon    She agrees to future follow ups   Goals Addressed               This Visit's Progress     Patient Stated     Insulin annual medicine assistance [name]) (pt-stated)   On track     Care Coordination Interventions: Discussed plans with patient for ongoing care management follow up and provided patient with direct contact information for care management team Outreach to follow up with patient - Medication assistance [employer] application  Confirmed by calling lily care with patient to verify 2 applications 0 1 was approved and patient should be receiving a letter         SDOH assessments and interventions completed:  No     Care Coordination Interventions:  Yes, provided   Follow up plan: Follow up call scheduled for 06/15/22    Encounter Outcome:  Pt. Visit Completed   [name] L. Lavina Hamman, RN, BSN, [place] Coordinator Office number [phone]

## 2022-05-20 NOTE — Patient Instructions (Addendum)
Visit Information  Thank you for taking time to visit with me today. Please don't hesitate to contact me if I can be of assistance to you.   Following are the goals we discussed today:   Goals Addressed               This Visit's Progress     Patient Stated     Insulin annual medicine assistance [facility]) (pt-stated)   On track     Care Coordination Interventions: Discussed plans with patient for ongoing care management follow up and provided patient with direct contact information for care management team Outreach to follow up with patient - Medication assistance [place] application  Confirmed by calling lily care with patient to verify 2 applications 0 1 was approved and patient should be receiving a letter         Our next appointment is by telephone on 06/15/22 at 2 pm  Please call the care guide team at [phone] if you need to cancel or reschedule your appointment.   If you are experiencing a Mental Health or [place] or need someone to talk to, please call the Suicide and Crisis Lifeline: 988 call the [place] National Suicide Prevention Lifeline: 779 296 9417 or TTY: 585 633 3490 TTY (302) 386-8709) to talk to a trained counselor call 1-800-273-TALK (toll free, 24 hour hotline) call the [facility]: 403-008-3689 call 911   The patient verbalized understanding of instructions, educational materials, and care plan provided today and DECLINED offer to receive copy of patient instructions, educational materials, and care plan.   The patient has been provided with contact information for the care management team and has been advised to call with any health related questions or concerns.    Doneta Bayman L. Lavina Hamman, RN, BSN, [place] Coordinator Office number [phone]

## 2022-06-15 ENCOUNTER — Ambulatory Visit: Payer: Self-pay | Admitting: *Deleted

## 2022-06-15 ENCOUNTER — Encounter: Payer: Self-pay | Admitting: *Deleted

## 2022-06-15 ENCOUNTER — Telehealth: Payer: Self-pay | Admitting: Pharmacist

## 2022-06-15 NOTE — Patient Outreach (Addendum)
  Care Coordination   Follow Up Visit Note   06/15/2022 Name: [name] MRN: [id number] DOB: 03-14-1946  [name] is a 77 y.o. year old female who sees [name], DO for primary care. I spoke with  [name] by phone today.  What matters to the patients health and wellness today?  Medication assistance with insulin    Goals Addressed               This Visit's Progress     Insulin annual medicine assistance [facility]) (pt-stated)        Care Coordination Interventions: UPDATE RN follow up with pt who confirmed she has not received a letter for [facility] for assistance with her ongoing insulin. Note RN care manager contacted the agency last month and confirmed pt was approved on 05/12/2023. RN requested permission to contact pt's pharmacy and she provided a contact number to the PCP pharmacy [phone]. RN spoke with Gregary Signs who indicated pt was not on the list of approved candidates however will have her assistance follow up accordingly. Pharmacy indicated pt would need to call and set up arrangements for delivery of any medications approved. Informed pt that she would need to contact [employer] directly and make the request on how she would like to set up delivery of her medications after approval is confirmed. Gregary Signs (PCP pharmacy) provided a contact # to give to pt [phone] for these arrangements and requested pt to call the agency on Friday 06/18/20 to allow the confirmation to be documented (pt agreed). If no resolution pt will follow up with her provider's pharmacy Gregary Signs). Confirmed pt has enough medication until next month before running out and reports her CBG are "good". No new issues or barriers at this time as pt receptive to another          SDOH assessments and interventions completed:  Yes  SDOH Interventions Today    Flowsheet Row Most Recent Value  SDOH Interventions   Food Insecurity Interventions Intervention Not Indicated  Housing  Interventions Intervention Not Indicated  Transportation Interventions Intervention Not Indicated  Utilities Interventions Intervention Not Indicated        Care Coordination Interventions:  Yes, provided   Follow up plan: Follow up call scheduled for 07/06/2022 @ 1:00pm    Encounter Outcome:  Pt. Visit Completed   [name] Mina, RN Care Management Coordinator [place] Office [phone]

## 2022-06-15 NOTE — Patient Instructions (Signed)
Visit Information  Thank you for taking time to visit with me today. Please don't hesitate to contact me if I can be of assistance to you.   Following are the goals we discussed today:   Goals Addressed               This Visit's Progress     Insulin annual medicine assistance [facility]) (pt-stated)        Care Coordination Interventions: UPDATE RN follow up with pt who confirmed she has not received a letter for [facility] for assistance with her ongoing insulin. Note RN care manager contacted the agency last month and confirmed pt was approved on 05/12/2023. RN requested permission to contact pt's pharmacy and she provided a contact number to the PCP pharmacy [phone]. RN spoke with Gregary Signs who indicated pt was not on the list of approved candidates however will have her assistance follow up accordingly. Pharmacy indicated pt would need to call and set up arrangements for delivery of any medications approved. Informed pt that she would need to contact [employer] directly and make the request on how she would like to set up delivery of her medications after approval is confirmed. Gregary Signs (PCP pharmacy) provided a contact # to give to pt [phone] for these arrangements and requested pt to call the agency on Friday 06/18/20 to allow the confirmation to be documented (pt agreed). If no resolution pt will follow up with her provider's pharmacy Gregary Signs). Confirmed pt has enough medication until next month before running out and reports her CBG are "good". No new issues or barriers at this time as pt receptive to another          Our next appointment is by telephone on 07/06/2022 at 1:00pm  Please call the care guide team at [phone] if you need to cancel or reschedule your appointment.   If you are experiencing a Mental Health or [place] or need someone to talk to, please call the Suicide and Crisis Lifeline: 988  The patient verbalized understanding of instructions,  educational materials, and care plan provided today and DECLINED offer to receive copy of patient instructions, educational materials, and care plan.   Raina Mina, RN Care Management Coordinator [place] Office [phone]

## 2022-06-15 NOTE — Telephone Encounter (Signed)
Patient given phone number to call for lilly care meds [phone] Patient has enough insulin at home for 1 month I don't see any confirmation of approval  But notes from RN say that patient was approved Sending to camille as an FYI--I don't remember getting approval letter, but may have overlooked

## 2022-06-18 NOTE — Telephone Encounter (Signed)
Patient is very upset that she has not received a call back in regards to her Lilly application. Patient aware that we are waiting on Lilly to approve or not approve the application. Patient called and aware that Rosendo Gros and Almyra Free are working on this and will call her once they hear from [place].

## 2022-06-18 NOTE — Telephone Encounter (Signed)
Calling back to speak to Almyra Free and she needs to talk to Almyra Free to asked two ?'s.

## 2022-06-18 NOTE — Telephone Encounter (Signed)
Can you call patient  She is having difficulties setting up shipment of insulin with lilly cares

## 2022-06-18 NOTE — Telephone Encounter (Signed)
Patient is upset and wants to talk to the head nurse and is upset because she is not getting a R/C call. She needs her insulin and is mad and states if she don't get a call she will call someone in [place].

## 2022-06-25 NOTE — Telephone Encounter (Signed)
Pt called requesting to speak with Almyra Free regarding her patient assistance. Explained to pt that Almyra Free no longer handles patient assistance and that Rosendo Gros is helping with it now. Pt asked for Camilles phone number which was given. Wanted to speak with her directly if possible.

## 2022-06-25 NOTE — Telephone Encounter (Signed)
Patient called back to follow up on application status. Informed patient she was approved 06/18/22 and should receive a letter from [employer] any day. I also provided her their phone number to follow up on her shipment.

## 2022-06-28 ENCOUNTER — Encounter: Payer: Medicare Other | Admitting: *Deleted

## 2022-06-28 ENCOUNTER — Telehealth: Payer: Self-pay | Admitting: Family Medicine

## 2022-06-28 DIAGNOSIS — E1122 Type 2 diabetes mellitus with diabetic chronic kidney disease: Secondary | ICD-10-CM

## 2022-06-28 NOTE — Telephone Encounter (Signed)
[name] have you received the rx from [place]?

## 2022-06-29 MED ORDER — BASAGLAR KWIKPEN 100 UNIT/ML ~~LOC~~ SOPN: 10.0000 [IU] | PEN_INJECTOR | Freq: Every day | SUBCUTANEOUS | 5 refills | Status: DC

## 2022-06-29 NOTE — Telephone Encounter (Signed)
Patient called back. Was informed a new rx was needing to be sent to [employer]. Let patient know a new rx was e-scribed to company today and she can call to follow up on her shipment with them tomorrow

## 2022-06-29 NOTE — Telephone Encounter (Signed)
Attempted to call pt's home phone back 3x but nothing rings.  Called husbands mobile and he told me to continue trying the house phone.

## 2022-07-06 ENCOUNTER — Ambulatory Visit: Payer: Self-pay | Admitting: *Deleted

## 2022-07-06 DIAGNOSIS — L97511 Non-pressure chronic ulcer of other part of right foot limited to breakdown of skin: Secondary | ICD-10-CM | POA: Diagnosis not present

## 2022-07-06 NOTE — Patient Outreach (Signed)
  Care Coordination   07/06/2022 Name: [name] MRN: [id number] DOB: June 26, 1945   Care Coordination Outreach Attempts:  An unsuccessful telephone outreach was attempted today to offer the patient information about available care coordination services as a benefit of their health plan.   Follow Up Plan:  Additional outreach attempts will be made to offer the patient care coordination information and services.   Encounter Outcome:  No Answer   Care Coordination Interventions:  No, not indicated    Roshon Duell L. Lavina Hamman, RN, BSN, [place] Coordinator Office number [phone]

## 2022-07-13 ENCOUNTER — Ambulatory Visit: Payer: Self-pay | Admitting: *Deleted

## 2022-07-13 NOTE — Patient Instructions (Addendum)
Visit Information  Thank you for taking time to visit with me today. Please don't hesitate to contact me if I can be of assistance to you.   Following are the goals we discussed today:   Goals Addressed               This Visit's Progress     Patient Stated     COMPLETED: Insulin annual medicine assistance [facility]) (pt-stated)   On track     Care Coordination Interventions: UPDATE RN follow up with pt who confirmed she has received her continuous glucometer & 6 month dose of insulin from the [facility] for assistance with her ongoing insulin. RN care manager contacted the agency and confirmed pt was approved on 05/12/2023. Confirmed permission was given RN CM peer to contact pt's pharmacy who spoke with Gregary Signs who had her assistance follow up accordingly. Confirmed patient followed up at [phone] for these arrangements  Interventions Today    Flowsheet Row Most Recent Value  Chronic Disease   Chronic disease during today's visit Diabetes, Congestive Heart Failure (CHF), Chronic Kidney Disease/End Stage Renal Disease (ESRD), Other  [medication assistance for insulin via [place] Interventions   General Interventions Discussed/Reviewed General Interventions Reviewed, Durable Medical Equipment (DME), General Interventions Discussed, Copy (DME) [place] Interventions   Pharmacy Dicussed/Reviewed Pharmacy Topics Reviewed, Medications and their functions       Goal completed        Our next appointment is by telephone on 10/11/22 at 2:45 pm  Please call the care guide team at [phone] if you need to cancel or reschedule your appointment.   If you are experiencing a Mental Health or [place] or need someone to talk to, please call the Suicide and Crisis Lifeline: 988 call the [place] National Suicide Prevention Lifeline: 385-716-3355 or TTY: (323)563-9386 TTY 9065057397) to talk to a  trained counselor call 1-800-273-TALK (toll free, 24 hour hotline) call the [facility]: 484 042 9346 call 911   The patient verbalized understanding of instructions, educational materials, and care plan provided today and DECLINED offer to receive copy of patient instructions, educational materials, and care plan.   The patient has been provided with contact information for the care management team and has been advised to call with any health related questions or concerns.    Renarda Mullinix L. Lavina Hamman, RN, BSN, [place] Coordinator Office number [phone]

## 2022-07-13 NOTE — Patient Outreach (Signed)
  Care Coordination   Follow Up Visit Note   07/13/2022 Name: [name] MRN: [id number] DOB: 07/12/1945  [name] is a 77 y.o. year old female who sees [name], DO for primary care. I spoke with  [name] by phone today.  What matters to the patients health and wellness today?  Resolved concerns with Lilly care Medication- Patient called staff. [employer], CPhT in [place]  She does have medicines enough for 6 months Diabetes has her new continuous glucometer  She received the freestyle libre device in 2023  She denies any concerns with worsening symptoms for Chronic Kidney disease (CKD), Hypertension, Diabetic foot conditions nor depression Agrees to follow up outreach quarterly    Goals Addressed               This Visit's Progress     Patient Stated     COMPLETED: Insulin annual medicine assistance ([facility]) (pt-stated)   On track     Care Coordination Interventions: UPDATE RN follow up with pt who confirmed she has received her continuous glucometer & 6 month dose of insulin from the [facility] for assistance with her ongoing insulin. RN care manager contacted the agency and confirmed pt was approved on 05/12/2023. Confirmed permission was given RN CM peer to contact pt's pharmacy who spoke with Gregary Signs who had her assistance follow up accordingly. Confirmed patient followed up at [phone] for these arrangements  Interventions Today    Flowsheet Row Most Recent Value  Chronic Disease   Chronic disease during today's visit Diabetes, Congestive Heart Failure (CHF), Chronic Kidney Disease/End Stage Renal Disease (ESRD), Other  [medication assistance for insulin via [place] Interventions   General Interventions Discussed/Reviewed General Interventions Reviewed, Durable Medical Equipment (DME), General Interventions Discussed, Copy (DME) [place] Interventions    Pharmacy Dicussed/Reviewed Pharmacy Topics Reviewed, Medications and their functions       Goal completed        SDOH assessments and interventions completed:  No     Care Coordination Interventions:  Yes, provided   Follow up plan: Follow up call scheduled for 10/11/22    Encounter Outcome:  Pt. Visit Completed   Shevon Sian L. Lavina Hamman, RN, BSN, [place] Coordinator Office number [phone]

## 2022-08-16 ENCOUNTER — Encounter (INDEPENDENT_AMBULATORY_CARE_PROVIDER_SITE_OTHER): Payer: Medicare Other | Admitting: Ophthalmology

## 2022-08-17 DIAGNOSIS — B351 Tinea unguium: Secondary | ICD-10-CM | POA: Diagnosis not present

## 2022-08-17 DIAGNOSIS — L84 Corns and callosities: Secondary | ICD-10-CM | POA: Diagnosis not present

## 2022-08-17 DIAGNOSIS — M79676 Pain in unspecified toe(s): Secondary | ICD-10-CM | POA: Diagnosis not present

## 2022-08-17 DIAGNOSIS — E1142 Type 2 diabetes mellitus with diabetic polyneuropathy: Secondary | ICD-10-CM | POA: Diagnosis not present

## 2022-08-23 DIAGNOSIS — I129 Hypertensive chronic kidney disease with stage 1 through stage 4 chronic kidney disease, or unspecified chronic kidney disease: Secondary | ICD-10-CM | POA: Diagnosis not present

## 2022-08-23 DIAGNOSIS — E1122 Type 2 diabetes mellitus with diabetic chronic kidney disease: Secondary | ICD-10-CM | POA: Diagnosis not present

## 2022-08-23 DIAGNOSIS — N390 Urinary tract infection, site not specified: Secondary | ICD-10-CM | POA: Diagnosis not present

## 2022-08-23 DIAGNOSIS — N2581 Secondary hyperparathyroidism of renal origin: Secondary | ICD-10-CM | POA: Diagnosis not present

## 2022-08-23 DIAGNOSIS — E559 Vitamin D deficiency, unspecified: Secondary | ICD-10-CM | POA: Diagnosis not present

## 2022-08-23 DIAGNOSIS — D631 Anemia in chronic kidney disease: Secondary | ICD-10-CM | POA: Diagnosis not present

## 2022-08-23 DIAGNOSIS — N183 Chronic kidney disease, stage 3 unspecified: Secondary | ICD-10-CM | POA: Diagnosis not present

## 2022-08-23 DIAGNOSIS — M109 Gout, unspecified: Secondary | ICD-10-CM | POA: Diagnosis not present

## 2022-08-23 DIAGNOSIS — N184 Chronic kidney disease, stage 4 (severe): Secondary | ICD-10-CM | POA: Diagnosis not present

## 2022-09-01 DIAGNOSIS — T8529XD Other mechanical complication of intraocular lens, subsequent encounter: Secondary | ICD-10-CM | POA: Diagnosis not present

## 2022-09-01 DIAGNOSIS — H353134 Nonexudative age-related macular degeneration, bilateral, advanced atrophic with subfoveal involvement: Secondary | ICD-10-CM | POA: Diagnosis not present

## 2022-09-01 DIAGNOSIS — E113553 Type 2 diabetes mellitus with stable proliferative diabetic retinopathy, bilateral: Secondary | ICD-10-CM | POA: Diagnosis not present

## 2022-09-01 DIAGNOSIS — H35351 Cystoid macular degeneration, right eye: Secondary | ICD-10-CM | POA: Diagnosis not present

## 2022-09-02 ENCOUNTER — Other Ambulatory Visit: Payer: Self-pay

## 2022-09-02 DIAGNOSIS — D500 Iron deficiency anemia secondary to blood loss (chronic): Secondary | ICD-10-CM | POA: Insufficient documentation

## 2022-09-03 ENCOUNTER — Other Ambulatory Visit: Payer: Self-pay

## 2022-09-07 ENCOUNTER — Telehealth: Payer: Self-pay | Admitting: Pharmacy Technician

## 2022-09-07 NOTE — Telephone Encounter (Signed)
Auth Submission: NO AUTH NEEDED Site of care: Site of care: [facility] Payer: medicare a/b & humana supp Medication & CPT/J Code(s) submitted: Feraheme (ferumoxytol) [id number] Route of submission (phone, fax, portal):  Phone # Fax # Auth type: Buy/Bill Units/visits requested: 2 Reference number:  Approval from: 09/07/22 to 05/24/23

## 2022-09-09 ENCOUNTER — Encounter (HOSPITAL_COMMUNITY): Payer: Medicare Other

## 2022-09-14 ENCOUNTER — Encounter (HOSPITAL_COMMUNITY)
Admission: RE | Admit: 2022-09-14 | Discharge: 2022-09-14 | Disposition: A | Discharge status: Home / Self Care | Payer: Medicare Other | Source: Ambulatory Visit | Attending: Nephrology | Admitting: Nephrology

## 2022-09-14 VITALS — BP 133/46 | HR 58 | Temp 97.9°F | Resp 16

## 2022-09-14 DIAGNOSIS — D509 Iron deficiency anemia, unspecified: Secondary | ICD-10-CM | POA: Diagnosis not present

## 2022-09-14 DIAGNOSIS — D631 Anemia in chronic kidney disease: Secondary | ICD-10-CM

## 2022-09-14 DIAGNOSIS — D500 Iron deficiency anemia secondary to blood loss (chronic): Secondary | ICD-10-CM

## 2022-09-14 MED ORDER — SODIUM CHLORIDE 0.9 % IV SOLN
510.0000 mg | Freq: Once | INTRAVENOUS | Status: AC
  Administered 2022-09-14: 510 mg via INTRAVENOUS
  Filled 2022-09-14: qty 510

## 2022-09-14 MED ORDER — ACETAMINOPHEN 325 MG PO TABS
650.0000 mg | ORAL_TABLET | Freq: Once | ORAL | Status: DC
  Filled 2022-09-14: qty 2

## 2022-09-14 MED ORDER — DIPHENHYDRAMINE HCL 25 MG PO CAPS
25.0000 mg | ORAL_CAPSULE | Freq: Once | ORAL | Status: DC
  Filled 2022-09-14: qty 1

## 2022-09-14 NOTE — Progress Notes (Signed)
Diagnosis: Iron Deficiency Anemia  Provider:  Manpreet Bhutani MD  Procedure: IV Infusion  IV Type: Peripheral, IV Location: L Antecubital  Feraheme (Ferumoxytol), Dose: 510 mg  Infusion Start Time: 1056  Infusion Stop Time: 1115  Post Infusion IV Care: Observation period completed and Peripheral IV Discontinued  Discharge: Condition: Good, Destination: Home . AVS Provided  Performed by:  Wyvonne Lenz, RN

## 2022-09-21 ENCOUNTER — Encounter (HOSPITAL_COMMUNITY)
Admission: RE | Admit: 2022-09-21 | Discharge: 2022-09-21 | Disposition: A | Discharge status: Home / Self Care | Payer: Medicare Other | Source: Ambulatory Visit | Attending: Nephrology | Admitting: Nephrology

## 2022-09-21 VITALS — BP 141/53 | HR 57 | Temp 97.6°F | Resp 16

## 2022-09-21 DIAGNOSIS — D500 Iron deficiency anemia secondary to blood loss (chronic): Secondary | ICD-10-CM

## 2022-09-21 DIAGNOSIS — N1832 Chronic kidney disease, stage 3b: Secondary | ICD-10-CM

## 2022-09-21 DIAGNOSIS — D509 Iron deficiency anemia, unspecified: Secondary | ICD-10-CM | POA: Diagnosis not present

## 2022-09-21 MED ORDER — DIPHENHYDRAMINE HCL 25 MG PO CAPS: 25.0000 mg | ORAL_CAPSULE | Freq: Once | ORAL | Status: DC

## 2022-09-21 MED ORDER — SODIUM CHLORIDE 0.9 % IV SOLN
510.0000 mg | Freq: Once | INTRAVENOUS | Status: AC
  Administered 2022-09-21: 510 mg via INTRAVENOUS
  Filled 2022-09-21: qty 17

## 2022-09-21 MED ORDER — ACETAMINOPHEN 325 MG PO TABS: 650.0000 mg | ORAL_TABLET | Freq: Once | ORAL | Status: DC

## 2022-09-21 NOTE — Progress Notes (Signed)
Diagnosis: Iron Deficiency Anemia  Provider:  Manpreet Bhutani MD  Procedure: IV Infusion  IV Type: Peripheral, IV Location: R wrist  Feraheme (Ferumoxytol), Dose: 510 mg  Infusion Start Time: 1044  Infusion Stop Time: 1103  Post Infusion IV Care: Patient declined observation and peripheral IV discontinued.  Discharge: Condition: Good, Destination: Home . AVS declined.  Performed by:  Wyvonne Lenz, RN

## 2022-10-09 ENCOUNTER — Other Ambulatory Visit: Payer: Self-pay | Admitting: Family Medicine

## 2022-10-09 DIAGNOSIS — H8112 Benign paroxysmal vertigo, left ear: Secondary | ICD-10-CM

## 2022-10-11 ENCOUNTER — Ambulatory Visit: Payer: Self-pay | Admitting: *Deleted

## 2022-10-11 DIAGNOSIS — I10 Essential (primary) hypertension: Secondary | ICD-10-CM

## 2022-10-11 DIAGNOSIS — N183 Chronic kidney disease, stage 3 unspecified: Secondary | ICD-10-CM

## 2022-10-11 DIAGNOSIS — H35043 Retinal micro-aneurysms, unspecified, bilateral: Secondary | ICD-10-CM

## 2022-10-11 NOTE — Patient Instructions (Addendum)
Visit Information  Thank you for taking time to visit with me today. Please don't hesitate to contact me if I can be of assistance to you.   Your [facility] Gift card should arrive in [facility] June 2024!!!!   Following are the goals we discussed today:   Goals Addressed             This Visit's Progress    Resource to assist with home care -care coordination services       Interventions Today    Flowsheet Row Most Recent Value  Chronic Disease   Chronic disease during today's visit Diabetes  General Interventions   General Interventions Discussed/Reviewed General Interventions Reviewed, Doctor Visits  Doctor Visits Discussed/Reviewed Doctor Visits Discussed, PCP  PCP/Specialist Visits Compliance with follow-up visit  Communication with --  [referral to care guide]  Exercise Interventions   Exercise Discussed/Reviewed Exercise Discussed, Physical Activity  Physical Activity Discussed/Reviewed Physical Activity Discussed, Home Exercise Program (HEP)  Education Interventions   Education Provided Provided Education  Provided Verbal Education On [employer]  [discussed the $25 gift card she has pending]  Mental Health Interventions   Mental Health Discussed/Reviewed Mental Health Reviewed, Coping Strategies  Nutrition Interventions   Nutrition Discussed/Reviewed Nutrition Discussed, Fluid intake  Pharmacy Interventions   Pharmacy Dicussed/Reviewed Pharmacy Topics Reviewed, Affording Medications  Safety Interventions   Safety Discussed/Reviewed Home Safety  Home Safety Assistive Devices, Refer for community resources      Interventions Today    Flowsheet Row Most Recent Value  Chronic Disease   Chronic disease during today's visit Diabetes  General Interventions   General Interventions Discussed/Reviewed General Interventions Reviewed, Doctor Visits  Doctor Visits Discussed/Reviewed Doctor Visits Discussed, PCP  PCP/Specialist Visits Compliance with follow-up visit   Communication with --  [referral to care guide]  Exercise Interventions   Exercise Discussed/Reviewed Exercise Discussed, Physical Activity  Physical Activity Discussed/Reviewed Physical Activity Discussed, Home Exercise Program (HEP)  Education Interventions   Education Provided Provided Education  Provided Verbal Education On [employer]  [discussed the $25 gift card she has pending]  Mental Health Interventions   Mental Health Discussed/Reviewed Mental Health Reviewed, Coping Strategies  Nutrition Interventions   Nutrition Discussed/Reviewed Nutrition Discussed, Fluid intake  Pharmacy Interventions   Pharmacy Dicussed/Reviewed Pharmacy Topics Reviewed, Affording Medications  Safety Interventions   Safety Discussed/Reviewed Home Safety  Home Safety Assistive Devices, Refer for community resources                Our next appointment is by telephone on 02/15/23 at 3 pm   Please call the care guide team at [phone] if you need to cancel or reschedule your appointment.   If you are experiencing a Mental Health or Behavioral Health Crisis or need someone to talk to, please call the Suicide and Crisis Lifeline: 988 call the [place] National Suicide Prevention Lifeline: (812)700-2972 or TTY: 337-185-5602 TTY 210-262-0786) to talk to a trained counselor call 1-800-273-TALK (toll free, 24 hour hotline) call the [facility] 318-323-2751 call 911   The patient verbalized understanding of instructions, educational materials, and care plan provided today and DECLINED offer to receive copy of patient instructions, educational materials, and care plan.   The patient has been provided with contact information for the care management team and has been advised to call with any health related questions or concerns.   Grantham Hippert L. Noelle Penner, RN, BSN, CCM [facility] Care Management Community Coordinator Office number [phone]

## 2022-10-11 NOTE — Patient Outreach (Signed)
Care Coordination   Follow Up Visit Note   02/16/2023 Name: [name] MRN: [id number] DOB: [birth year]  [name] is a 77 y.o. year old female who sees Raliegh Ip, DO for primary care. I spoke with  Phylliss Blakes by phone today.  What matters to the patients health and wellness today?  So far I'm doing good Diabetes -doing well Lost of a family member, sister in January 2024 and another sister recently   Gift card for [facility]25 [facility] pending    Goals Addressed             This Visit's Progress    Resource to assist with home care -care coordination services       Interventions Today    Flowsheet Row Most Recent Value  Chronic Disease   Chronic disease during today's visit Diabetes  General Interventions   General Interventions Discussed/Reviewed General Interventions Reviewed, Doctor Visits  Doctor Visits Discussed/Reviewed Doctor Visits Discussed, PCP  PCP/Specialist Visits Compliance with follow-up visit  Communication with --  [referral to care guide]  Exercise Interventions   Exercise Discussed/Reviewed Exercise Discussed, Physical Activity  Physical Activity Discussed/Reviewed Physical Activity Discussed, Home Exercise Program (HEP)  Education Interventions   Education Provided Provided Education  Provided Verbal Education On [employer]  [discussed the $25 gift card she has pending]  Mental Health Interventions   Mental Health Discussed/Reviewed Mental Health Reviewed, Coping Strategies  Nutrition Interventions   Nutrition Discussed/Reviewed Nutrition Discussed, Fluid intake  Pharmacy Interventions   Pharmacy Dicussed/Reviewed Pharmacy Topics Reviewed, Affording Medications  Safety Interventions   Safety Discussed/Reviewed Home Safety  Home Safety Assistive Devices, Refer for community resources      Interventions Today    Flowsheet Row Most Recent Value  Chronic Disease   Chronic disease during today's visit Diabetes  General  Interventions   General Interventions Discussed/Reviewed General Interventions Reviewed, Doctor Visits  Doctor Visits Discussed/Reviewed Doctor Visits Discussed, PCP  PCP/Specialist Visits Compliance with follow-up visit  Communication with --  [referral to care guide]  Exercise Interventions   Exercise Discussed/Reviewed Exercise Discussed, Physical Activity  Physical Activity Discussed/Reviewed Physical Activity Discussed, Home Exercise Program (HEP)  Education Interventions   Education Provided Provided Education  Provided Verbal Education On [employer]  [discussed the $25 gift card she has pending]  Mental Health Interventions   Mental Health Discussed/Reviewed Mental Health Reviewed, Coping Strategies  Nutrition Interventions   Nutrition Discussed/Reviewed Nutrition Discussed, Fluid intake  Pharmacy Interventions   Pharmacy Dicussed/Reviewed Pharmacy Topics Reviewed, Affording Medications  Safety Interventions   Safety Discussed/Reviewed Home Safety  Home Safety Assistive Devices, Refer for community resources                SDOH assessments and interventions completed:  No     Care Coordination Interventions:  Yes, provided   Follow up plan: Follow up call scheduled for 02/15/23    Encounter Outcome:  Pt. Visit Completed    Rilie Glanz L. Noelle Penner, RN, BSN, CCM [facility] Care Management Community Coordinator Office number [phone]

## 2022-10-13 ENCOUNTER — Ambulatory Visit: Payer: Medicare Other | Admitting: Family Medicine

## 2022-10-14 ENCOUNTER — Telehealth: Payer: Self-pay | Admitting: Family Medicine

## 2022-10-14 NOTE — Telephone Encounter (Signed)
  Prescription Request  10/14/2022  Is this a "Controlled Substance" medicine? no  Have you seen your PCP in the last 2 weeks? no  If YES, route message to pool  -  If NO, patient needs to be scheduled for appointment.  What is the name of the medication or equipment? Needs Freestyle Libre 2  Have you contacted your pharmacy to request a refill? yes   Which pharmacy would you [facility]   Patient notified that their request is being sent to the clinical staff for review and that they should receive a response within 2 business days.

## 2022-10-15 NOTE — Telephone Encounter (Signed)
Patient calling back to check on sensors  for FreeStyle Libre 2.

## 2022-10-15 NOTE — Telephone Encounter (Signed)
Contacted [facility] to see if paperwork had been received. Staff there says that paperwork was received but they need notes from patients last visit and it has to be within the last 6 months. According to our records it was just over 6 months since she has had a visit with [place] or endo. Staff there stated that they would contact the endocrinologist and see if they could get some additional info from them. Advised they would contact the patient and let them know what to do. I contacted the patient and advised her of the situation. Asked patient if she had a regular blood sugar monitor to prick her finger and she stated that she did. She stated that she has an upcoming appt with PCP and if she can't get the sensors then she would check with her monitor until she is able to have her visit and get her sensors.

## 2022-10-19 DIAGNOSIS — M79676 Pain in unspecified toe(s): Secondary | ICD-10-CM | POA: Diagnosis not present

## 2022-10-19 DIAGNOSIS — E1142 Type 2 diabetes mellitus with diabetic polyneuropathy: Secondary | ICD-10-CM | POA: Diagnosis not present

## 2022-10-19 DIAGNOSIS — B351 Tinea unguium: Secondary | ICD-10-CM | POA: Diagnosis not present

## 2022-10-19 DIAGNOSIS — L84 Corns and callosities: Secondary | ICD-10-CM | POA: Diagnosis not present

## 2022-10-21 ENCOUNTER — Encounter: Payer: Self-pay | Admitting: "Endocrinology

## 2022-10-21 ENCOUNTER — Ambulatory Visit (INDEPENDENT_AMBULATORY_CARE_PROVIDER_SITE_OTHER): Payer: Medicare Other | Admitting: "Endocrinology

## 2022-10-21 VITALS — BP 140/50 | HR 64 | Ht 62.0 in | Wt 145.2 lb

## 2022-10-21 DIAGNOSIS — Z794 Long term (current) use of insulin: Secondary | ICD-10-CM | POA: Insufficient documentation

## 2022-10-21 DIAGNOSIS — E1122 Type 2 diabetes mellitus with diabetic chronic kidney disease: Secondary | ICD-10-CM | POA: Diagnosis not present

## 2022-10-21 DIAGNOSIS — E034 Atrophy of thyroid (acquired): Secondary | ICD-10-CM

## 2022-10-21 DIAGNOSIS — E782 Mixed hyperlipidemia: Secondary | ICD-10-CM

## 2022-10-21 DIAGNOSIS — N1832 Chronic kidney disease, stage 3b: Secondary | ICD-10-CM | POA: Diagnosis not present

## 2022-10-21 DIAGNOSIS — I10 Essential (primary) hypertension: Secondary | ICD-10-CM | POA: Diagnosis not present

## 2022-10-21 LAB — POCT GLYCOSYLATED HEMOGLOBIN (HGB A1C): HbA1c, POC (controlled diabetic range): 6.6 % (ref 0.0–7.0)

## 2022-10-21 NOTE — Progress Notes (Signed)
10/21/2022   Endocrinology follow-up note   Subjective:    Patient ID: [name], female    DOB: 05-14-1946.  She is being seen in follow-up in the management of uncontrolled type 2 diabetes, hypothyroidism, hypertension, hyperlipidemia.  Past Medical History:  Diagnosis Date   Anal fistula    Anemia in chronic renal disease    Aranesp injection --  when Hg <11, last injection 12-24-14.   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    CKD (chronic kidney disease), stage III [facility])    nephrologist--  dr Briant Cedar-- LOV  07-09-2016   Complication of anesthesia    post-op confusion    Constipation    Coronary artery disease    Diabetic gastroparesis (HCC)    Diabetic retinopathy (HCC)    bilateral --  monitored by dr Luciana Axe   Diverticulosis of colon    GAD (generalized anxiety disorder)    GERD (gastroesophageal reflux disease)    Hip fracture requiring operative repair [facility])    History of colon polyps    benign   History of esophagitis    History of GI bleed    upper 2009  due to esophagitis  &  2002  due to Mallory-Weiss tear   History of hyperkalemia    pt had previously been canceled twice dos due to elevated K+, 07-29-2016 last date cancelled -- pt visited pcp same day treated w/ kayexelate and K+ came down, pt brought all her medication's in to pcp office and found pt was taking losartan that had been discontinued due to ckd, pcp stated this was cause of elevated K+   History of Mallory-Weiss syndrome    12/ 2002--  resolved   History of rectal abscess    12-29-2004  bedside I & D   Hyperlipidemia    Hypertension    Hypothyroidism    LAFB (left anterior  fascicular block)    Right bundle branch block    Sacral decubitus ulcer    since 2014- 01-02-15 remains with wound" gauze dressing changes daily.   Type II diabetes mellitus (HCC)    Past Surgical History:  Procedure Laterality Date   APPLICATION OF A-CELL OF EXTREMITY N/A 04/07/2015   Procedure: A CELL PLACMENT;  Surgeon: Alena Bills Dillingham, DO;  Location: [facility] OR;  Service: Plastics;  Laterality: N/A;   CARDIOVASCULAR STRESS TEST  12-30-2004   normal perfusion study/  no ischemia or infartion/  normal LV wall function and wall motion , ef 66%   CATARACT EXTRACTION W/ INTRAOCULAR LENS  IMPLANT, BILATERAL  1995   COLONOSCOPY  2008   w/Dr.Brodie    COLONOSCOPY W/ POLYPECTOMY  last one 2008   COMPRESSION HIP SCREW Right 05/18/2014   Procedure: COMPRESSION HIP;  Surgeon: Vickki Hearing, MD;  Location: [facility];  Service: Orthopedics;  Laterality: Right;   ESOPHAGOGASTRODUODENOSCOPY  last one 01-09-2011   EVALUATION UNDER ANESTHESIA WITH FISTULECTOMY N/A 01/06/2015   Procedure: EXAM UNDER ANESTHESIA , placement of seton;  Surgeon: Avel Peace, MD;  Location: [facility];  Service: General;  Laterality: N/A;   I & D EXTREMITY N/A 04/07/2015   Procedure: IRRIGATION AND  DEBRIDEMENT ISCHIAL ULCER;  Surgeon: Alena Bills Dillingham, DO;  Location: [facility] OR;  Service: Plastics;  Laterality: N/A;   INCISION AND DRAINAGE OF WOUND N/A 09/30/2014   Procedure: IRRIGATION AND DEBRIDEMENT SACRAL WOUND, EXCISION OF PERIRECTAL TRACT WITH PLACEMENT OF ACCELL;  Surgeon: Wayland Denis, DO;  Location: [facility];  Service: Plastics;  Laterality: N/A;   LIGATION OF INTERNAL FISTULA TRACT N/A 10/22/2016   Procedure: LIGATION OF INTERNAL FISTULA TRACT;  Surgeon: Romie Levee, MD;  Location: [facility];  Service: General;  Laterality: N/A;   ORIF FEMUR FRACTURE Left 10/09/2012   Procedure: OPEN REDUCTION INTERNAL FIXATION (ORIF) DISTAL FEMUR FRACTURE;  Surgeon: Budd Palmer, MD;  Location:  [facility] OR;  Service: Orthopedics;  Laterality: Left;   RETINOPATHY SURGERY Bilateral 1980's?   TRANSTHORACIC ECHOCARDIOGRAM  02-18-2011   mild LVH,  ef 55-60%,  grade I diastolic dysfunction/  mild TR/  RV systolic pressure increased consistant with moderate pulmonary hypertension   Social History   Socioeconomic History   Marital status: Married    Spouse name: [name]   Number of children: 0   Years of education: 12   Highest education level: Not on file  Occupational History   Occupation: Retired    Associate Professor: RETIRED  Tobacco Use   Smoking status: Never   Smokeless tobacco: Never  Vaping Use   Vaping Use: Never used  Substance and Sexual Activity   Alcohol use: No   Drug use: No   Sexual activity: Not Currently  Other Topics Concern   Not on file  Social History Narrative   Lives with husband.    Lost her mother in 1999   Caffeine use: none      01/19/22 No kids, support each other. Spouse diagnosed with cancer recently. She is legally blind but cares for spouse and vice versa. As of 2023 married for 55 years per pt   Social Determinants of Health   Financial Resource Strain: Low Risk  (04/27/2022)   Overall Financial Resource Strain (CARDIA)    Difficulty of Paying Living Expenses: Not hard at all  Food Insecurity: No Food Insecurity (06/15/2022)   Hunger Vital Sign    Worried About Running Out of Food in the Last Year: Never true    Ran Out of Food in the Last Year: Never true  Transportation Needs: No Transportation Needs (06/15/2022)   PRAPARE - Administrator, Civil Service (Medical): No    Lack of Transportation (Non-Medical): No  Physical Activity: Insufficiently Active (04/27/2022)   Exercise Vital Sign    Days of Exercise per Week: 3 days    Minutes of Exercise per Session: 30 min  Stress: No Stress Concern Present (04/27/2022)   [employer] of Occupational Health - Occupational Stress Questionnaire    Feeling of Stress : Not at all  Social  Connections: Moderately Integrated (04/27/2022)   Social Connection and Isolation Panel [NHANES]    Frequency of Communication with Friends and Family: More than three times a week    Frequency of Social Gatherings with Friends and Family: More than three times a week    Attends Religious Services: 1 to 4 times per year    Active Member of [employer] or Organizations: No    Attends Banker Meetings: Never    Marital Status: Married   Outpatient Encounter Medications as of 10/21/2022  Medication Sig   amLODipine (NORVASC) 5 MG tablet Take 1 tablet (5 mg total) by mouth  daily.   atorvastatin (LIPITOR) 10 MG tablet Take 1 tablet (10 mg total) by mouth daily.   carvedilol (COREG) 12.5 MG tablet TAKE 1 TABLET TWICE DAILY WITH A MEAL   cephALEXin (KEFLEX) 500 MG capsule Take 500 mg by mouth 3 (three) times daily.   cholecalciferol (VITAMIN D3) 25 MCG (1000 UNIT) tablet TAKE 1 TABLET EVERY DAY   colchicine 0.6 MG tablet Take 0.6 mg by mouth daily as needed.   doxycycline (VIBRAMYCIN) 100 MG capsule Take 100 mg by mouth 2 (two) times daily.   Febuxostat (ULORIC) 80 MG TABS 1 tablet Orally Once a day to manage gout for 30 day(s)   Febuxostat 80 MG TABS Take by mouth.   fluticasone (FLONASE) 50 MCG/ACT nasal spray Place 2 sprays into both nostrils daily. Use 2 sprays in each nostril daily   furosemide (LASIX) 40 MG tablet Take 1 tablet twice weekly   Insulin Glargine (BASAGLAR KWIKPEN) 100 UNIT/ML Inject 10 Units into the skin daily with breakfast.   ketorolac (ACULAR) 0.5 % ophthalmic solution Place 1 drop into the right eye 2 (two) times daily.   Lancets Misc. MISC Use as directed to check blood sugar up to 4x daily. E11.65   levocetirizine (XYZAL) 5 MG tablet TAKE 1 TABLET AT BEDTIME AS NEEDED FOR ALLERGIES/EAR FULLNESS   levofloxacin (LEVAQUIN) 500 MG tablet Take 500 mg by mouth daily.   levothyroxine (SYNTHROID) 75 MCG tablet Take 1 tablet (75 mcg total) by mouth daily before breakfast.    losartan (COZAAR) 100 MG tablet 1 tablet Orally Once a day   meclizine (ANTIVERT) 25 MG tablet Take 0.5-1 tablets (12.5-25 mg total) by mouth 3 (three) times daily as needed for dizziness.   metoprolol succinate (TOPROL-XL) 50 MG 24 hr tablet 1 tablet Orally Once a day   mupirocin ointment (BACTROBAN) 2 % Apply topically as directed.   pantoprazole (PROTONIX) 20 MG tablet Take 1 tablet (20 mg total) by mouth daily.   prednisoLONE acetate (PRED FORTE) 1 % ophthalmic suspension Place 1 drop into the right eye 4 (four) times daily.   silver sulfADIAZINE (SILVADENE) 1 % cream Apply topically as directed.   ULTICARE MICRO PEN NEEDLES 32G X 4 MM MISC TWICE DAILY   No facility-administered encounter medications on file as of 10/21/2022.   ALLERGIES: Allergies  Allergen Reactions   Aspirin Nausea And Vomiting   Ciprofloxacin Other (See Comments)    Upset Stomach   Codeine Nausea And Vomiting    Makes me sick    Hydrocodone-Acetaminophen     Other reaction(s): Unknown  09/14/22: Patient states that when she takes any type of pain medication she starts vomiting blood. States she was told not to take any OTC pain medication.   Losartan     Hyperkalemia    Micardis [Telmisartan] Other (See Comments)    unknown   Nexium [Esomeprazole Magnesium] Other (See Comments)    Causes internal bleeding   Niaspan [Niacin Er] Other (See Comments)    Reaction is unknown   Nsaids     Other reaction(s): Unknown   Onglyza [Saxagliptin] Other (See Comments)    Reaction is unknown   Other     No otc pain medications   Oxycodone Hcl     Other reaction(s): Unknown   Rofecoxib Other (See Comments)    unknown   Simvastatin    Tequin [Gatifloxacin] Other (See Comments)    Reaction is unknown   Welchol [Colesevelam Hcl]    Amoxicillin Nausea And Vomiting  and Rash    Has patient had a PCN reaction causing immediate rash, facial/tongue/throat swelling, SOB or lightheadedness with hypotension: Yes Has  patient had a PCN reaction causing severe rash involving mucus membranes or skin necrosis: no  Has patient had a PCN reaction that required hospitalization No Has patient had a PCN reaction occurring within the last 10 years: No If all of the above answers are "NO", then may proceed with Cephalosporin use.    VACCINATION STATUS: Immunization History  Administered Date(s) Administered   COVID-19, mRNA, vaccine(Comirnaty)12 years and older 03/15/2022   Fluad Quad(high Dose 65+) 02/05/2019, 03/05/2020, 03/10/2021, 03/15/2022   Influenza, High Dose Seasonal PF 03/05/2016, 03/14/2017, 03/01/2018   Influenza,inj,Quad PF,6+ Mos 02/16/2013, 02/27/2015   Moderna Covid-19 Vaccine Bivalent Booster 2yrs & up 03/10/2021   Moderna Sars-Covid-2 Vaccination 10/14/2020   PFIZER(Purple Top)SARS-COV-2 Vaccination 06/13/2019, 07/04/2019, 03/05/2020   Pneumococcal Conjugate-13 10/11/2014   Pneumococcal Polysaccharide-23 10/10/2012   Respiratory Syncytial Virus Vaccine,Recomb Aduvanted(Arexvy) 04/21/2022   Tdap 10/06/2012   Zoster Recombinat (Shingrix) 06/02/2021, 08/04/2021    Diabetes She presents for her follow-up diabetic visit. She has type 2 diabetes mellitus. Onset time: She was diagnosed at approximate age of 20 years . Her disease course has been improving. There are no hypoglycemic associated symptoms. Pertinent negatives for hypoglycemia include no confusion, headaches, pallor or seizures. Pertinent negatives for diabetes include no chest pain, no fatigue, no polydipsia, no polyphagia and no visual change. There are no hypoglycemic complications. Symptoms are improving. Diabetic complications include nephropathy, peripheral neuropathy, PVD and retinopathy. Risk factors for coronary artery disease include diabetes mellitus, dyslipidemia, hypertension, sedentary lifestyle and post-menopausal. Current diabetic treatment includes oral agent (monotherapy). She is compliant with treatment most of the time.  Her weight is fluctuating minimally. She is following a generally unhealthy diet. When asked about meal planning, she reported none. She has not had a previous visit with a dietitian. She never participates in exercise. Her home blood glucose trend is decreasing steadily. Her breakfast blood glucose range is generally 110-130 mg/dl. Her bedtime blood glucose range is generally 130-140 mg/dl. Her overall blood glucose range is 130-140 mg/dl. (She is utilizing her freestyle Libre device.  She presents with her CGM device showing 64% time in range, 20% level 1 hyperglycemia, 8% level 2 hyperglycemia.  Her point-of-care A1c 6.6% improving from 7.2%.  She did not document or report significant hypoglycemia.    ) An ACE inhibitor/angiotensin II receptor blocker is being taken. Eye exam is current.  Thyroid Problem Presents for initial visit. Onset time: 15 years. Patient reports no cold intolerance, diarrhea, fatigue, heat intolerance, palpitations or visual change. The symptoms have been stable. Past treatments include levothyroxine. The following procedures have not been performed: thyroidectomy.  Hypertension This is a chronic problem. The current episode started more than 1 year ago. Pertinent negatives include no chest pain, headaches, palpitations or shortness of breath. Past treatments include angiotensin blockers. Hypertensive end-organ damage includes PVD and retinopathy. Identifiable causes of hypertension include a thyroid problem.    Review of systems  Constitutional: + Minimally fluctuating body weight,  current  Body mass index is 26.56 kg/m. , no fatigue, no subjective hyperthermia, no subjective hypothermia    Objective:    BP (!) 140/50 Comment: R arm with manuel cuff  Pulse 64   Ht 5\' 2"  (1.575 m)   Wt 145 lb 3.2 oz (65.9 kg)   BMI 26.56 kg/m   Wt Readings from Last 3 Encounters:  10/21/22 145 lb 3.2  oz (65.9 kg)  04/27/22 146 lb (66.2 kg)  04/22/22 147 lb (66.7 kg)      Physical Exam- Limited  Constitutional:  Body mass index is 26.56 kg/m. , not in acute distress, normal state of mind    Results for orders placed or performed in visit on 10/21/22  HgB A1c  Result Value Ref Range   Hemoglobin A1C     HbA1c POC (<> result, manual entry)     HbA1c, POC (prediabetic range)     HbA1c, POC (controlled diabetic range) 6.6 0.0 - 7.0 %   Diabetic Labs (most recent): Lab Results  Component Value Date   HGBA1C 6.6 10/21/2022   HGBA1C 7.2 (H) 03/29/2022   HGBA1C 7.1 (H) 09/29/2021   MICROALBUR 100 02/17/2013   Lipid Panel     Component Value Date/Time   CHOL 108 03/29/2022 0836   CHOL 105 09/11/2012 1057   TRIG 93 03/29/2022 0836   TRIG 170 (H) 10/11/2014 1159   TRIG 79 09/11/2012 1057   HDL 42 03/29/2022 0836   HDL 53 10/11/2014 1159   HDL 41 09/11/2012 1057   CHOLHDL 2.6 03/29/2022 0836   LDLCALC 48 03/29/2022 0836   LDLCALC 50 02/14/2014 1012   LDLCALC 48 09/11/2012 1057     Assessment & Plan:   1. Type 2 diabetes mellitus with stage 4 chronic kidney disease, without long-term current use of insulin (HCC)  - Patient has currently controlled asymptomatic type 2 DM since  77 years of age.  She is utilizing her freestyle [place] device.  She presents with her CGM device showing 64% time in range, 20% level 1 hyperglycemia, 8% level 2 hyperglycemia.  Her point-of-care A1c 6.6% improving from 7.2%.  She did not document or report significant hypoglycemia.     -her  diabetes is complicated by PAD, CKD, neuropathy and patient remains at a high risk for more acute and chronic complications of diabetes which include CAD, CVA, CKD, retinopathy, and neuropathy. These are all discussed in detail with the patient.  - I have counseled the patient on diet management  by adopting a carbohydrate restricted/protein rich diet.   - she acknowledges that there is a room for improvement in her food and drink choices. - Suggestion is made for her to avoid  simple carbohydrates  from her diet including Cakes, Sweet Desserts, Ice Cream, Soda (diet and regular), Sweet Tea, Candies, Chips, Cookies, Store Bought Juices, Alcohol in Excess of  1-2 drinks a day, Artificial Sweeteners,  Coffee Creamer, and "Sugar-free" Products, Lemonade. This will help patient to have more stable blood glucose profile and potentially avoid unintended weight gain.  - I encouraged the patient to switch to  unprocessed or minimally processed complex starch and increased protein intake (animal or plant source), fruits, and vegetables.  - Patient is advised to stick to a routine mealtimes to eat 3 meals  a day and avoid unnecessary snacks ( to snack only to correct hypoglycemia).   - I have approached patient with the following individualized plan to manage diabetes and patient agrees:    -Her husband, Link Snuffer , continues to offer to help. -She is advised to continue Basaglar 10 units daily at breakfast,  advised to use her CGM to monitor blood glucose at all times.   - First priority in this patient will be to avoid hypoglycemia.   -Patient is encouraged to call clinic for blood glucose levels less than 70 or above 200 mg /dl weekly average.  -Patient  is not a candidate for MTF, Incretin therapy.  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    - Patient specific target  A1c;  LDL, HDL, Triglycerides,  were discussed in detail.  2) BP/HTN:  -Her blood pressure is controlled to target.  She is advised to continue her current blood pressure medications including amlodipine 5 mg p.o. daily, carvedilol 12.5 mg p.o. twice daily.  She  has documented allergy to ARB.  3) Lipids/HPL: Her recent lipid panel continues to show improvement in her LDL at 58.she will continue to benefit from statin intervention currently on atorvastatin 10 mg p.o. daily at bedtime.   Side effects and precautions discussed with her.     4)  Weight/Diet: Her BMI is 26.56- She is not a candidate for major  weight loss.     CDE Consult has been initiated, she has limited ability to exercise.  5)  Hypothyroidism:  - Her current thyroid function tests are consistent with appropriate replacement.  She is advised to continue levothyroxine 50 mcg p.o. daily before breakfast.    - We discussed about the correct intake of her thyroid hormone, on empty stomach at fasting, with water, separated by at least 30 minutes from breakfast and other medications,  and separated by more than 4 hours from calcium, iron, multivitamins, acid reflux medications (PPIs). -Patient is made aware of the fact that thyroid hormone replacement is needed for life, dose to be adjusted by periodic monitoring of thyroid function tests.    5) Chronic Care/Health Maintenance:  -Patient is  on ACEI/ARB and Statin medications and encouraged to continue to follow up with Ophthalmology, Podiatrist at least yearly or according to recommendations, and advised to   stay away from smoking. I have recommended yearly flu vaccine and pneumonia vaccination at least every 5 years;  and  sleep for at least 7 hours a day.  - I advised patient to maintain close follow up with Raliegh Ip, DO for primary care needs.   I spent  42  minutes in the care of the patient today including review of labs from CMP, Lipids, Thyroid Function, Hematology (current and previous including abstractions from other facilities); face-to-face time discussing  her blood glucose readings/logs, discussing hypoglycemia and hyperglycemia episodes and symptoms, medications doses, her options of short and long term treatment based on the latest standards of care / guidelines;  discussion about incorporating lifestyle medicine;  and documenting the encounter. Risk reduction counseling performed per USPSTF guidelines to reduce cardiovascular risk factors.     Please refer to Patient Instructions for Blood Glucose Monitoring and Insulin/Medications Dosing Guide"  in media  tab for additional information. Please  also refer to " Patient Self Inventory" in the Media  tab for reviewed elements of pertinent patient history.  Phylliss Blakes participated in the discussions, expressed understanding, and voiced agreement with the above plans.  All questions were answered to her satisfaction. she is encouraged to contact clinic should she have any questions or concerns prior to her return visit.     Follow up plan: - Return in about 6 months (around 04/23/2023) for F/U with Pre-visit Labs, Meter/CGM/Logs, A1c here.  Marquis Lunch, MD Phone: [phone]  Fax: [phone]  -  This note was partially dictated with voice recognition software. Similar sounding words can be transcribed inadequately or may not  be corrected upon review.  10/21/2022, 11:30 AM

## 2022-10-26 ENCOUNTER — Telehealth: Payer: Self-pay | Admitting: Family Medicine

## 2022-10-26 DIAGNOSIS — M1A09X1 Idiopathic chronic gout, multiple sites, with tophus (tophi): Secondary | ICD-10-CM | POA: Diagnosis not present

## 2022-10-26 DIAGNOSIS — Z6826 Body mass index (BMI) 26.0-26.9, adult: Secondary | ICD-10-CM | POA: Diagnosis not present

## 2022-10-26 DIAGNOSIS — Z79899 Other long term (current) drug therapy: Secondary | ICD-10-CM | POA: Diagnosis not present

## 2022-10-26 DIAGNOSIS — E663 Overweight: Secondary | ICD-10-CM | POA: Diagnosis not present

## 2022-10-26 MED ORDER — ACCU-CHEK AVIVA PLUS VI STRP: ORAL_STRIP | 12 refills | Status: DC

## 2022-10-26 MED ORDER — ACCU-CHEK SOFTCLIX LANCETS MISC: 12 refills | Status: DC

## 2022-10-26 NOTE — Telephone Encounter (Signed)
Lancets and test strips sent over and pt aware.

## 2022-10-26 NOTE — Telephone Encounter (Signed)
  Prescription Request  10/26/2022  Is this a "Controlled Substance" medicine? Lancets for sugar meter  Have you seen your PCP in the last 2 weeks? No 11/10/2022  If YES, route message to pool  -  If NO, patient needs to be scheduled for appointment.  What is the name of the medication or equipment? Lancets for sugar meter  Have you contacted your pharmacy to request a refill? no   Which pharmacy would you like this sent to? [facility]   Patient notified that their request is being sent to the clinical staff for review and that they should receive a response within 2 business days.

## 2022-10-26 NOTE — Addendum Note (Signed)
Addended by: Hessie Diener on: 10/26/2022 02:32 PM   Modules accepted: Orders

## 2022-11-10 ENCOUNTER — Other Ambulatory Visit: Payer: Self-pay | Admitting: Pharmacist

## 2022-11-10 ENCOUNTER — Ambulatory Visit (INDEPENDENT_AMBULATORY_CARE_PROVIDER_SITE_OTHER): Payer: Medicare Other | Admitting: Family Medicine

## 2022-11-10 ENCOUNTER — Encounter: Payer: Self-pay | Admitting: Family Medicine

## 2022-11-10 VITALS — BP 142/56 | HR 50 | Temp 98.5°F | Ht 62.0 in | Wt 144.0 lb

## 2022-11-10 DIAGNOSIS — N1832 Chronic kidney disease, stage 3b: Secondary | ICD-10-CM | POA: Diagnosis not present

## 2022-11-10 DIAGNOSIS — E1169 Type 2 diabetes mellitus with other specified complication: Secondary | ICD-10-CM | POA: Diagnosis not present

## 2022-11-10 DIAGNOSIS — E785 Hyperlipidemia, unspecified: Secondary | ICD-10-CM | POA: Diagnosis not present

## 2022-11-10 DIAGNOSIS — I152 Hypertension secondary to endocrine disorders: Secondary | ICD-10-CM | POA: Diagnosis not present

## 2022-11-10 DIAGNOSIS — E782 Mixed hyperlipidemia: Secondary | ICD-10-CM | POA: Diagnosis not present

## 2022-11-10 DIAGNOSIS — Z794 Long term (current) use of insulin: Secondary | ICD-10-CM

## 2022-11-10 DIAGNOSIS — Z7409 Other reduced mobility: Secondary | ICD-10-CM

## 2022-11-10 DIAGNOSIS — E1159 Type 2 diabetes mellitus with other circulatory complications: Secondary | ICD-10-CM | POA: Diagnosis not present

## 2022-11-10 DIAGNOSIS — N1831 Chronic kidney disease, stage 3a: Secondary | ICD-10-CM | POA: Diagnosis not present

## 2022-11-10 DIAGNOSIS — E034 Atrophy of thyroid (acquired): Secondary | ICD-10-CM | POA: Diagnosis not present

## 2022-11-10 DIAGNOSIS — E1122 Type 2 diabetes mellitus with diabetic chronic kidney disease: Secondary | ICD-10-CM

## 2022-11-10 NOTE — Progress Notes (Signed)
Subjective: CC:DM PCP: Raliegh Ip, DO ZOX:WRUEA [name] is a 77 y.o. female presenting to clinic today for:  1. Type 2 Diabetes with hypertension, hyperlipidemia:  Patient was seen by Dr Fransico Him recently.  [name] was told that her sugar was looking really good.  He placed future orders for fasting labs and [name] is ready to have these done today.  [name] is compliant with all medications and denies any chest pain, shortness of breath or edema.  [name] does report that her freestyle Josephine Igo has run out and [name] was under the impression that our office was post to coordinate the renewal of this.  [name] is not quite sure what paperwork I am supposed to complete for her but insists that 3 faxes have been sent to the office and we have yet to respond  Last eye exam: needs Last foot exam: UTD Last A1c:  Lab Results  Component Value Date   HGBA1C 6.6 10/21/2022   Nephropathy screen indicated?: UTD Last flu, zoster and/or pneumovax:  Immunization History  Administered Date(s) Administered   COVID-19, mRNA, vaccine(Comirnaty)12 years and older 03/15/2022   Fluad Quad(high Dose 65+) 02/05/2019, 03/05/2020, 03/10/2021, 03/15/2022   Influenza, High Dose Seasonal PF 03/05/2016, 03/14/2017, 03/01/2018   Influenza,inj,Quad PF,6+ Mos 02/16/2013, 02/27/2015   Moderna Covid-19 Vaccine Bivalent Booster 57yrs & up 03/10/2021   Moderna Sars-Covid-2 Vaccination 10/14/2020   PFIZER(Purple Top)SARS-COV-2 Vaccination 06/13/2019, 07/04/2019, 03/05/2020   Pneumococcal Conjugate-13 10/11/2014   Pneumococcal Polysaccharide-23 10/10/2012   Respiratory Syncytial Virus Vaccine,Recomb Aduvanted(Arexvy) 04/21/2022   Tdap 10/06/2012   Zoster Recombinat (Shingrix) 06/02/2021, 08/04/2021    ROS: Per HPI  Allergies  Allergen Reactions   Aspirin Nausea And Vomiting   Ciprofloxacin Other (See Comments)    Upset Stomach   Codeine Nausea And Vomiting    Makes me sick    Hydrocodone-Acetaminophen     Other reaction(s):  Unknown  09/14/22: Patient states that when [name] takes any type of pain medication [name] starts vomiting blood. States [name] was told not to take any OTC pain medication.   Losartan     Hyperkalemia    Micardis [Telmisartan] Other (See Comments)    unknown   Nexium [Esomeprazole Magnesium] Other (See Comments)    Causes internal bleeding   Niaspan [Niacin Er] Other (See Comments)    Reaction is unknown   Nsaids     Other reaction(s): Unknown   Onglyza [Saxagliptin] Other (See Comments)    Reaction is unknown   Other     No otc pain medications   Oxycodone Hcl     Other reaction(s): Unknown   Rofecoxib Other (See Comments)    unknown   Simvastatin    Tequin [Gatifloxacin] Other (See Comments)    Reaction is unknown   Welchol [Colesevelam Hcl]    Amoxicillin Nausea And Vomiting and Rash    Has patient had a PCN reaction causing immediate rash, facial/tongue/throat swelling, SOB or lightheadedness with hypotension: Yes Has patient had a PCN reaction causing severe rash involving mucus membranes or skin necrosis: no  Has patient had a PCN reaction that required hospitalization No Has patient had a PCN reaction occurring within the last 10 years: No If all of the above answers are "NO", then may proceed with Cephalosporin use.    Past Medical History:  Diagnosis Date   Anal fistula    Anemia in chronic renal disease    Aranesp injection --  when Hg <11, last injection 12-24-14.   Anxiety  Aphakia of eye, right 10/02/2019   Condition resolved, status post vitrectomy, removal IOL, insertion Yamani scleral tunnel [facility] CT Lucio lens June 2021   Arthritis    knees and hand/fingers. 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    CKD (chronic kidney disease), stage III [facility])    nephrologist--  dr Briant Cedar-- LOV  07-09-2016   Complication of anesthesia    post-op confusion     Constipation    Coronary artery disease    Diabetic gastroparesis (HCC)    Diabetic retinopathy (HCC)    bilateral --  monitored by dr Luciana Axe   Diverticulosis of colon    GAD (generalized anxiety disorder)    GERD (gastroesophageal reflux disease)    Hip fracture requiring operative repair [facility])    History of colon polyps    benign   History of esophagitis    History of GI bleed    upper 2009  due to esophagitis  &  2002  due to Mallory-Weiss tear   History of hyperkalemia    pt had previously been canceled twice dos due to elevated K+, 07-29-2016 last date cancelled -- pt visited pcp same day treated w/ kayexelate and K+ came down, pt brought all her medication's in to pcp office and found pt was taking losartan that had been discontinued due to ckd, pcp stated this was cause of elevated K+   History of Mallory-Weiss syndrome    12/ 2002--  resolved   History of rectal abscess    12-29-2004  bedside I & D   Hyperlipidemia    Hypertension    Hypothyroidism    LAFB (left anterior fascicular block)    Right bundle branch block    Sacral decubitus ulcer    since 2014- 01-02-15 remains with wound" gauze dressing changes daily.   Type II diabetes mellitus (HCC)     Current Outpatient Medications:    Accu-Chek Softclix Lancets lancets, Use as instructed, Disp: 100 each, Rfl: 12   amLODipine (NORVASC) 5 MG tablet, Take 1 tablet (5 mg total) by mouth daily., Disp: 90 tablet, Rfl: 3   atorvastatin (LIPITOR) 10 MG tablet, Take 1 tablet (10 mg total) by mouth daily., Disp: 90 tablet, Rfl: 03   carvedilol (COREG) 12.5 MG tablet, TAKE 1 TABLET TWICE DAILY WITH A MEAL, Disp: 180 tablet, Rfl: 3   cephALEXin (KEFLEX) 500 MG capsule, Take 500 mg by mouth 3 (three) times daily., Disp: , Rfl:    cholecalciferol (VITAMIN D3) 25 MCG (1000 UNIT) tablet, TAKE 1 TABLET EVERY DAY, Disp: 90 tablet, Rfl: 3   colchicine 0.6 MG tablet, Take 0.6 mg by mouth daily as needed., Disp: , Rfl:    doxycycline  (VIBRAMYCIN) 100 MG capsule, Take 100 mg by mouth 2 (two) times daily., Disp: , Rfl:    Febuxostat (ULORIC) 80 MG TABS, 1 tablet Orally Once a day to manage gout for 30 day(s), Disp: , Rfl:    Febuxostat 80 MG TABS, Take by mouth., Disp: , Rfl:    fluticasone (FLONASE) 50 MCG/ACT nasal spray, Place 2 sprays into both nostrils daily. Use 2 sprays in each nostril daily, Disp: 16 g, Rfl: 3   furosemide (LASIX) 40 MG tablet, Take 1 tablet twice weekly, Disp: 26 tablet, Rfl: 3   glucose blood (ACCU-CHEK AVIVA PLUS) test strip, Use as instructed, Disp: 100 each, Rfl: 12  Insulin Glargine (BASAGLAR KWIKPEN) 100 UNIT/ML, Inject 10 Units into the skin daily with breakfast., Disp: 45 mL, Rfl: 5   ketorolac (ACULAR) 0.5 % ophthalmic solution, Place 1 drop into the right eye 2 (two) times daily., Disp: 5 mL, Rfl: 12   Lancets Misc. MISC, Use as directed to check blood sugar up to 4x daily. E11.65, Disp: 300 each, Rfl: 3   levofloxacin (LEVAQUIN) 500 MG tablet, Take 500 mg by mouth daily., Disp: , Rfl:    levothyroxine (SYNTHROID) 75 MCG tablet, Take 1 tablet (75 mcg total) by mouth daily before breakfast., Disp: 90 tablet, Rfl: 3   losartan (COZAAR) 100 MG tablet, 1 tablet Orally Once a day, Disp: , Rfl:    meclizine (ANTIVERT) 25 MG tablet, Take 0.5-1 tablets (12.5-25 mg total) by mouth 3 (three) times daily as needed for dizziness., Disp: 30 tablet, Rfl: 0   metoprolol succinate (TOPROL-XL) 50 MG 24 hr tablet, 1 tablet Orally Once a day, Disp: , Rfl:    mupirocin ointment (BACTROBAN) 2 %, Apply topically as directed., Disp: , Rfl:    pantoprazole (PROTONIX) 20 MG tablet, Take 1 tablet (20 mg total) by mouth daily., Disp: 90 tablet, Rfl: 3   prednisoLONE acetate (PRED FORTE) 1 % ophthalmic suspension, Place 1 drop into the right eye 4 (four) times daily., Disp: 5 mL, Rfl: 0   silver sulfADIAZINE (SILVADENE) 1 % cream, Apply topically as directed., Disp: , Rfl:    ULTICARE MICRO PEN NEEDLES 32G X 4 MM MISC,  TWICE DAILY, Disp: 100 each, Rfl: 5 Social History   Socioeconomic History   Marital status: Married    Spouse name: [name]   Number of children: 0   Years of education: 12   Highest education level: Not on file  Occupational History   Occupation: Retired    Associate Professor: RETIRED  Tobacco Use   Smoking status: Never   Smokeless tobacco: Never  Vaping Use   Vaping Use: Never used  Substance and Sexual Activity   Alcohol use: No   Drug use: No   Sexual activity: Not Currently  Other Topics Concern   Not on file  Social History Narrative   Lives with husband.    Lost her mother in 1999   Caffeine use: none      01/19/22 No kids, support each other. Spouse diagnosed with cancer recently. [name] is legally blind but cares for spouse and vice versa. As of 2023 married for 55 years per pt   Social Determinants of Health   Financial Resource Strain: Low Risk  (04/27/2022)   Overall Financial Resource Strain (CARDIA)    Difficulty of Paying Living Expenses: Not hard at all  Food Insecurity: No Food Insecurity (06/15/2022)   Hunger Vital Sign    Worried About Running Out of Food in the Last Year: Never true    Ran Out of Food in the Last Year: Never true  Transportation Needs: No Transportation Needs (06/15/2022)   PRAPARE - Administrator, Civil Service (Medical): No    Lack of Transportation (Non-Medical): No  Physical Activity: Insufficiently Active (04/27/2022)   Exercise Vital Sign    Days of Exercise per Week: 3 days    Minutes of Exercise per Session: 30 min  Stress: No Stress Concern Present (04/27/2022)   [employer] of Occupational Health - Occupational Stress Questionnaire    Feeling of Stress : Not at all  Social Connections: Moderately Integrated (04/27/2022)   Social Connection and Isolation  Panel [NHANES]    Frequency of Communication with Friends and Family: More than three times a week    Frequency of Social Gatherings with Friends and Family: More than  three times a week    Attends Religious Services: 1 to 4 times per year    Active Member of [employer] or Organizations: No    Attends Banker Meetings: Never    Marital Status: Married  Catering manager Violence: Not At Risk (04/27/2022)   Humiliation, Afraid, Rape, and Kick questionnaire    Fear of Current or Ex-Partner: No    Emotionally Abused: No    Physically Abused: No    Sexually Abused: No   Family History  Problem Relation Age of Onset   Colon cancer Father 32   Prostate cancer Father    Diabetes Father    Coronary artery disease Mother 87   Heart disease Mother    Diabetes Mother    Coronary artery disease Sister 14   Diabetes Sister    Heart disease Sister    Diabetes Sister    Diabetes Sister    Diabetes Sister    Diabetes Sister    Diabetes Sister    Diabetes Maternal Grandmother    Esophageal cancer Neg Hx    Stomach cancer Neg Hx    Rectal cancer Neg Hx     Objective: Office vital signs reviewed. BP (!) 142/56   Pulse (!) 50   Temp 98.5 F (36.9 C)   Ht 5\' 2"  (1.575 m)   Wt 144 lb (65.3 kg)   SpO2 98%   BMI 26.34 kg/m   Physical Examination:  General: Awake, alert, well nourished, No acute distress HEENT: sclera white, MMM Cardio: bradycardic rate and rhythm, S1S2 heard, no murmurs appreciated Pulm: clear to auscultation bilaterally, no wheezes, rhonchi or rales; normal work of breathing on room air MSK: arrives in wheelchair  Assessment/ Plan: 77 y.o. female   Type 2 diabetes mellitus with stage 3a chronic kidney disease, with long-term current use of insulin (HCC)  Hyperlipidemia associated with type 2 diabetes mellitus (HCC)  Hypertension associated with diabetes (HCC)  Impaired mobility and endurance  Sugar under good control.  Continue to follow-up with endocrinology.  I coordinated care with our CCM pharmacist with regards to her freestyle libre.  They will get this arranged for her.  [name] should continue all medications as  prescribed  May follow-up in 6 months, sooner if concerns arise   No orders of the defined types were placed in this encounter.  No orders of the defined types were placed in this encounter.    Raliegh Ip, DO [facility] [phone]

## 2022-11-10 NOTE — Progress Notes (Signed)
11/10/2022 Name: [name] MRN: [id number] DOB: September 09, 1945    Submitted CGM Libre 2 re-order to [facility] via parachute portal    Subjective:  Objective:  Lab Results  Component Value Date   HGBA1C 6.6 10/21/2022    Lab Results  Component Value Date   CREATININE 1.80 (H) 03/29/2022   BUN 29 (H) 03/29/2022   NA 141 03/29/2022   K 5.0 03/29/2022   CL 105 03/29/2022   CO2 21 03/29/2022    Lab Results  Component Value Date   CHOL 108 03/29/2022   HDL 42 03/29/2022   LDLCALC 48 03/29/2022   TRIG 93 03/29/2022   CHOLHDL 2.6 03/29/2022    Medications Reviewed Today     Reviewed by Danella Maiers, RPH (Pharmacist) on 11/10/22 at 1204  Med List Status: <None>   Medication Order Taking? Sig Documenting Provider Last Dose Status Informant  Accu-Chek Softclix Lancets lancets [id number] No Use as instructed Raliegh Ip, DO Taking Active   amLODipine (NORVASC) 5 MG tablet [id number] No Take 1 tablet (5 mg total) by mouth daily. Delynn Flavin M, DO Taking Active   atorvastatin (LIPITOR) 10 MG tablet [id number] No Take 1 tablet (10 mg total) by mouth daily. Delynn Flavin M, DO Taking Active   carvedilol (COREG) 12.5 MG tablet [id number] No TAKE 1 TABLET TWICE DAILY WITH A MEAL Delynn Flavin M, DO Taking Active   cephALEXin (KEFLEX) 500 MG capsule [id number] No Take 500 mg by mouth 3 (three) times daily. [provider] Taking Active   cholecalciferol (VITAMIN D3) 25 MCG (1000 UNIT) tablet [id number] No TAKE 1 TABLET EVERY DAY Delynn Flavin M, DO Taking Active   colchicine 0.6 MG tablet [id number] No Take 0.6 mg by mouth daily as needed. [provider] Taking Active   doxycycline (VIBRAMYCIN) 100 MG capsule [id number] No Take 100 mg by mouth 2 (two) times daily. [provider] Taking Active   Febuxostat (ULORIC) 80 MG TABS [id number] No 1 tablet Orally Once a day to manage gout for 30 day(s) [provider]  Taking Active   Febuxostat 80 MG TABS [id number] No Take by mouth. [provider] Taking Active   fluticasone (FLONASE) 50 MCG/ACT nasal spray [id number] No Place 2 sprays into both nostrils daily. Use 2 sprays in each nostril daily Delynn Flavin M, DO Taking Active   furosemide (LASIX) 40 MG tablet [id number] No Take 1 tablet twice weekly Delynn Flavin M, DO Taking Active   glucose blood (ACCU-CHEK AVIVA PLUS) test strip [id number] No Use as instructed Raliegh Ip, DO Taking Active   Insulin Glargine (BASAGLAR KWIKPEN) 100 UNIT/ML [id number] No Inject 10 Units into the skin daily with breakfast. Delynn Flavin M, DO Taking Active   ketorolac (ACULAR) 0.5 % ophthalmic solution [id number] No Place 1 drop into the right eye 2 (two) times daily. Rankin, Alford Highland, MD Taking Active   Lancets Misc. MISC [id number] No Use as directed to check blood sugar up to 4x daily. E11.65 Raliegh Ip, DO Taking Active   levofloxacin (LEVAQUIN) 500 MG tablet [id number] No Take 500 mg by mouth daily. [provider] Taking Active   levothyroxine (SYNTHROID) 75 MCG tablet [id number] No Take 1 tablet (75 mcg total) by mouth daily before breakfast. Delynn Flavin M, DO Taking Active   losartan (COZAAR) 100 MG tablet [id number] No 1 tablet Orally Once a day [provider] Taking Active   meclizine (ANTIVERT) 25 MG tablet [id number] No Take 0.5-1  tablets (12.5-25 mg total) by mouth 3 (three) times daily as needed for dizziness. Delynn Flavin M, DO Taking Active   metoprolol succinate (TOPROL-XL) 50 MG 24 hr tablet [id number] No 1 tablet Orally Once a day [provider] Taking Active   mupirocin ointment (BACTROBAN) 2 % [id number] No Apply topically as directed. [provider] Taking Active   pantoprazole (PROTONIX) 20 MG tablet [id number] No Take 1 tablet (20 mg total) by mouth daily. Delynn Flavin M, DO Taking Active   prednisoLONE acetate (PRED FORTE) 1 %  ophthalmic suspension [id number] No Place 1 drop into the right eye 4 (four) times daily. Rankin, Alford Highland, MD Taking Active   silver sulfADIAZINE (SILVADENE) 1 % cream [id number] No Apply topically as directed. [provider] Taking Active   ULTICARE MICRO PEN NEEDLES 32G X 4 MM MISC [id number] No TWICE DAILY Nida, Denman George, MD Taking Active             Patient must call and arrange shipment [facility] Tracking # [phone]   Kieth Brightly, PharmD, BCACP Clinical Pharmacist, [facility]

## 2022-11-11 ENCOUNTER — Telehealth: Payer: Self-pay | Admitting: Family Medicine

## 2022-11-11 ENCOUNTER — Other Ambulatory Visit: Payer: Self-pay | Admitting: "Endocrinology

## 2022-11-11 ENCOUNTER — Telehealth: Payer: Self-pay

## 2022-11-11 DIAGNOSIS — E034 Atrophy of thyroid (acquired): Secondary | ICD-10-CM

## 2022-11-11 LAB — LIPID PANEL
Chol/HDL Ratio: 2.7 ratio (ref 0.0–4.4)
Cholesterol, Total: 114 mg/dL (ref 100–199)
HDL: 43 mg/dL (ref >39)
LDL Chol Calc (NIH): 53 mg/dL (ref 0–99)
Triglycerides: 95 mg/dL (ref 0–149)
VLDL Cholesterol Cal: 18 mg/dL (ref 5–40)

## 2022-11-11 LAB — T4, FREE: Free T4: 1.83 ng/dL — ABNORMAL HIGH (ref 0.82–1.77)

## 2022-11-11 LAB — COMPREHENSIVE METABOLIC PANEL
ALT: 4 IU/L (ref 0–32)
AST: 10 IU/L (ref 0–40)
Albumin: 3.8 g/dL (ref 3.8–4.8)
Alkaline Phosphatase: 113 IU/L (ref 44–121)
BUN/Creatinine Ratio: 20 (ref 12–28)
BUN: 35 mg/dL — ABNORMAL HIGH (ref 8–27)
Bilirubin Total: 0.5 mg/dL (ref 0.0–1.2)
CO2: 21 mmol/L (ref 20–29)
Calcium: 8.9 mg/dL (ref 8.7–10.3)
Chloride: 105 mmol/L (ref 96–106)
Creatinine, Ser: 1.77 mg/dL — ABNORMAL HIGH (ref 0.57–1.00)
Globulin, Total: 2.6 g/dL (ref 1.5–4.5)
Glucose: 149 mg/dL — ABNORMAL HIGH (ref 70–99)
Potassium: 5.6 mmol/L — ABNORMAL HIGH (ref 3.5–5.2)
Sodium: 138 mmol/L (ref 134–144)
Total Protein: 6.4 g/dL (ref 6.0–8.5)
eGFR: 29 mL/min/{1.73_m2} — ABNORMAL LOW (ref >59)

## 2022-11-11 LAB — TSH: TSH: 0.075 u[IU]/mL — ABNORMAL LOW (ref 0.450–4.500)

## 2022-11-11 MED ORDER — LEVOTHYROXINE SODIUM 50 MCG PO TABS: 50.0000 ug | ORAL_TABLET | Freq: Every day | ORAL | 1 refills | Status: DC

## 2022-11-11 NOTE — Telephone Encounter (Signed)
Patient wants to talk to Raynelle Fanning about getting more sensors. Stated that she reached out to [name] but has not heard back. Please call back and advise.

## 2022-11-11 NOTE — Telephone Encounter (Signed)
Spoke with pt advising her of the need to lower her levothyroxine to daily each morning per Dr.Nida's orders. Pt voiced understanding.

## 2022-11-11 NOTE — Telephone Encounter (Signed)
-----   Message from Roma Kayser, MD sent at 11/11/2022  9:35 AM EDT ----- Ander Slade, I want to lower her levothyroxine to 50 mcg po qam. Her 75 mcg seems to be too high. ----- Message ----- From: Derrell Lolling, LPN Sent: 01/01/9146  10:28 AM EDT To: Roma Kayser, MD

## 2022-11-26 ENCOUNTER — Ambulatory Visit (INDEPENDENT_AMBULATORY_CARE_PROVIDER_SITE_OTHER): Payer: Medicare Other | Admitting: Family

## 2022-11-26 ENCOUNTER — Encounter: Payer: Self-pay | Admitting: Family

## 2022-11-26 VITALS — BP 134/65 | HR 58 | Temp 97.8°F | Ht 62.0 in

## 2022-11-26 DIAGNOSIS — I10 Essential (primary) hypertension: Secondary | ICD-10-CM

## 2022-11-26 DIAGNOSIS — F411 Generalized anxiety disorder: Secondary | ICD-10-CM

## 2022-11-26 MED ORDER — BUSPIRONE HCL 5 MG PO TABS
2.5000 mg | ORAL_TABLET | Freq: Two times a day (BID) | ORAL | 2 refills | Status: DC | PRN
Start: 2022-11-26 — End: 2024-02-22

## 2022-11-26 NOTE — Patient Instructions (Signed)

## 2022-11-26 NOTE — Progress Notes (Signed)
Subjective:    Patient ID: GLENDENE LANGBEIN, female    DOB: [birth year], 77 y.o.   MRN: [id number]  Chief Complaint  Patient presents with   Medical Management of Chronic Issues   Pt presents to the office today with elevated BP at home. She reports her BP at home this morning 166/80. Pt and husband reports she is very anxious and stressed. Her sister passed away recently, has a sister in a nursing home, and husband just had surgery. She reports she is "stressing about everything".   Her BP is at goal today.  Hypertension This is a chronic problem. The current episode started more than 1 year ago. The problem has been resolved since onset. The problem is controlled. Pertinent negatives include no malaise/fatigue, peripheral edema or shortness of breath. Past treatments include angiotensin blockers and calcium channel blockers. The current treatment provides moderate improvement.      Review of Systems  Constitutional:  Negative for malaise/fatigue.  Respiratory:  Negative for shortness of breath.   All other systems reviewed and are negative.      Objective:   Physical Exam Vitals reviewed.  Constitutional:      General: She is not in acute distress.    Appearance: She is well-developed.  HENT:     Head: Normocephalic and atraumatic.  Eyes:     Pupils: Pupils are equal, round, and reactive to light.  Neck:     Thyroid: No thyromegaly.  Cardiovascular:     Rate and Rhythm: Normal rate and regular rhythm.     Heart sounds: Normal heart sounds. No murmur heard. Pulmonary:     Effort: Pulmonary effort is normal. No respiratory distress.     Breath sounds: Normal breath sounds. No wheezing.  Abdominal:     General: Bowel sounds are normal. There is no distension.     Palpations: Abdomen is soft.     Tenderness: There is no abdominal tenderness.  Musculoskeletal:        General: No tenderness. Normal range of motion.     Cervical back: Normal range of motion and neck supple.   Skin:    General: Skin is warm and dry.     Coloration: Skin is pale.  Neurological:     Mental Status: She is alert and oriented to person, place, and time.     Cranial Nerves: No cranial nerve deficit.     Motor: Weakness present.     Deep Tendon Reflexes: Reflexes are normal and symmetric.  Psychiatric:        Behavior: Behavior normal.        Thought Content: Thought content normal.        Judgment: Judgment normal.      BP 134/65   Pulse (!) 58   Temp 97.8 F (36.6 C) (Temporal)   Ht 5\' 2"  (1.575 m)   SpO2 98%   BMI 26.34 kg/m       Assessment & Plan:   KHADEJAH LIGGON comes in today with chief complaint of Medical Management of Chronic Issues   Diagnosis and orders addressed:  1. Anxiety state Start Buspar 5 mg BID prn  Stress management  Keep follow up with PCP - busPIRone (BUSPAR) 5 MG tablet; Take 0.5-1 tablets (2.5-5 mg total) by mouth 2 (two) times daily as needed.  Dispense: 20 tablet; Refill: 2  2. Essential hypertension At goal today Continue to monitor at home  Stress management  Continue current medications  Jannifer Rodney, FNP

## 2022-12-01 ENCOUNTER — Telehealth: Payer: Self-pay | Admitting: Family Medicine

## 2022-12-01 NOTE — Telephone Encounter (Signed)
Appt scheduled for this Friday per patients request. Patient notified

## 2022-12-02 DIAGNOSIS — E113553 Type 2 diabetes mellitus with stable proliferative diabetic retinopathy, bilateral: Secondary | ICD-10-CM | POA: Diagnosis not present

## 2022-12-02 DIAGNOSIS — T8529XD Other mechanical complication of intraocular lens, subsequent encounter: Secondary | ICD-10-CM | POA: Diagnosis not present

## 2022-12-02 DIAGNOSIS — H35351 Cystoid macular degeneration, right eye: Secondary | ICD-10-CM | POA: Diagnosis not present

## 2022-12-02 DIAGNOSIS — H353134 Nonexudative age-related macular degeneration, bilateral, advanced atrophic with subfoveal involvement: Secondary | ICD-10-CM | POA: Diagnosis not present

## 2022-12-02 LAB — HM DIABETES EYE EXAM

## 2022-12-03 ENCOUNTER — Ambulatory Visit (INDEPENDENT_AMBULATORY_CARE_PROVIDER_SITE_OTHER): Payer: Medicare Other | Admitting: Family

## 2022-12-03 ENCOUNTER — Encounter: Payer: Self-pay | Admitting: Family

## 2022-12-03 VITALS — BP 133/61 | HR 57 | Temp 97.7°F | Ht 62.0 in

## 2022-12-03 DIAGNOSIS — F411 Generalized anxiety disorder: Secondary | ICD-10-CM | POA: Diagnosis not present

## 2022-12-03 DIAGNOSIS — I10 Essential (primary) hypertension: Secondary | ICD-10-CM

## 2022-12-03 NOTE — Progress Notes (Signed)
Subjective:    Patient ID: [name], female    DOB: April 28, 1946, 77 y.o.   MRN: [id number]  Chief Complaint  Patient presents with   Follow-up    Wants Blood work to see if she has any infection   PT presents to the office today requesting labs for "infection". She was seen last week for GAD and given buspar as needed. She states she took this one and made her "sick on her stomach".   Reports a few years ago her husband had sepsis and is worried this could be her.   Denies any fevers, pain, chest pain, or chills.  She is worried about her BP. Her BP is at goal today, but states it is elevated at home when she gets anxious. Reports it can been 165/80.   Anxiety Presents for follow-up visit. Symptoms include depressed mood, excessive worry, nervous/anxious behavior, obsessions and restlessness.        Review of Systems  Psychiatric/Behavioral:  The patient is nervous/anxious.   All other systems reviewed and are negative.      Objective:   Physical Exam Vitals reviewed.  Constitutional:      General: She is not in acute distress.    Appearance: She is well-developed.  HENT:     Head: Normocephalic and atraumatic.  Eyes:     Pupils: Pupils are equal, round, and reactive to light.  Neck:     Thyroid: No thyromegaly.  Cardiovascular:     Rate and Rhythm: Normal rate and regular rhythm.     Heart sounds: Normal heart sounds. No murmur heard. Pulmonary:     Effort: Pulmonary effort is normal. No respiratory distress.     Breath sounds: Normal breath sounds. No wheezing.  Abdominal:     General: Bowel sounds are normal. There is no distension.     Palpations: Abdomen is soft.     Tenderness: There is no abdominal tenderness.  Musculoskeletal:        General: No tenderness. Normal range of motion.     Cervical back: Normal range of motion and neck supple.  Skin:    General: Skin is warm and dry.  Neurological:     Mental Status: She is alert and oriented to  person, place, and time.     Cranial Nerves: No cranial nerve deficit.     Motor: Weakness (in wheelchair) present.     Gait: Gait abnormal.     Deep Tendon Reflexes: Reflexes are normal and symmetric.  Psychiatric:        Behavior: Behavior normal.        Thought Content: Thought content normal.        Judgment: Judgment normal.       BP (!) 140/65   Pulse (!) 57   Temp 97.7 F (36.5 C) (Temporal)   Ht 5\' 2"  (1.575 m)   SpO2 99%   BMI 26.34 kg/m      Assessment & Plan:  [name] comes in today with chief complaint of Follow-up (Wants Blood work to see if she has any infection)   Diagnosis and orders addressed:  1. GAD (generalized anxiety disorder) Need to follow up with PCP Stress management  - CBC with Differential/Platelet  2. Essential hypertension At goal today - CBC with Differential/Platelet  3. Anxiety state - CBC with Differential/Platelet   Labs pending Buspar as needed  Follow up plan: Needs follow up with PCP to discuss uncontrolled anxiety   Jannifer Rodney,  FNP

## 2022-12-03 NOTE — Patient Instructions (Signed)

## 2022-12-04 LAB — CBC WITH DIFFERENTIAL/PLATELET
Basophils Absolute: 0 10*3/uL (ref 0.0–0.2)
Basos: 0 %
EOS (ABSOLUTE): 0.3 10*3/uL (ref 0.0–0.4)
Eos: 3 %
Hematocrit: 34.3 % (ref 34.0–46.6)
Hemoglobin: 11.4 g/dL (ref 11.1–15.9)
Immature Grans (Abs): 0 10*3/uL (ref 0.0–0.1)
Immature Granulocytes: 0 %
Lymphocytes Absolute: 1.5 10*3/uL (ref 0.7–3.1)
Lymphs: 17 %
MCH: 29.9 pg (ref 26.6–33.0)
MCHC: 33.2 g/dL (ref 31.5–35.7)
MCV: 90 fL (ref 79–97)
Monocytes Absolute: 0.5 10*3/uL (ref 0.1–0.9)
Monocytes: 6 %
Neutrophils Absolute: 6.3 10*3/uL (ref 1.4–7.0)
Neutrophils: 74 %
Platelets: 150 10*3/uL (ref 150–450)
RBC: 3.81 x10E6/uL (ref 3.77–5.28)
RDW: 12.8 % (ref 11.7–15.4)
WBC: 8.7 10*3/uL (ref 3.4–10.8)

## 2022-12-08 ENCOUNTER — Other Ambulatory Visit: Payer: Self-pay

## 2023-01-18 DIAGNOSIS — M79676 Pain in unspecified toe(s): Secondary | ICD-10-CM | POA: Diagnosis not present

## 2023-01-18 DIAGNOSIS — E1142 Type 2 diabetes mellitus with diabetic polyneuropathy: Secondary | ICD-10-CM | POA: Diagnosis not present

## 2023-01-18 DIAGNOSIS — L84 Corns and callosities: Secondary | ICD-10-CM | POA: Diagnosis not present

## 2023-01-18 DIAGNOSIS — B351 Tinea unguium: Secondary | ICD-10-CM | POA: Diagnosis not present

## 2023-02-07 ENCOUNTER — Telehealth: Payer: Self-pay | Admitting: Family Medicine

## 2023-02-07 NOTE — Telephone Encounter (Signed)
Called patient states she already called librea and they are sending her a new machine nothing else was need from this office

## 2023-02-08 ENCOUNTER — Ambulatory Visit: Payer: Self-pay | Admitting: *Deleted

## 2023-02-08 NOTE — Patient Outreach (Signed)
Care Coordination   02/08/2023 Name: [name] MRN: [id number] DOB: December 21, 1945   Care Coordination Outreach Attempts:  An unsuccessful telephone outreach was attempted today to offer the patient information about available care coordination services.  Follow Up Plan:  Additional outreach attempts will be made to offer the patient care coordination information and services.   Encounter Outcome:  No Answer   Care Coordination Interventions:  Yes, provided Call to pcp office to verify RN CM has correct number for patient. Confirmed RN CM does have the correct numbers for patient and her husband   Cala Bradford L. Noelle Penner, RN, BSN, CCM, Care Management Coordinator [phone]

## 2023-02-15 ENCOUNTER — Ambulatory Visit: Payer: Self-pay | Admitting: *Deleted

## 2023-02-16 NOTE — Patient Outreach (Signed)
Care Coordination   Follow Up Visit Note   02/16/2023 Name: [name] MRN: [id number] DOB: 03/26/1946  [name] is a 77 y.o. year old female who sees Raliegh Ip, DO for primary care. I spoke with  Phylliss Blakes by phone today.  What matters to the patients health and wellness today?  Home services to assist her as spouse is now not able to     Goals Addressed             This Visit's Progress    Resource to assist with home care -care coordination services       Interventions Today    Flowsheet Row Most Recent Value  Chronic Disease   Chronic disease during today's visit Other  [home services to help after changes in husband medical status]  General Interventions   General Interventions Discussed/Reviewed General Interventions Reviewed, [employer], Communication with  Communication with --  [referral to care guide]  Exercise Interventions   Exercise Discussed/Reviewed Exercise Reviewed, Physical Activity  Physical Activity Discussed/Reviewed Physical Activity Reviewed, Home Exercise Program (HEP)  Education Interventions   Education Provided Provided Education  [employer] health services, personal care services, private duty costs, council on aging]  Provided Verbal Education On Mental Health/Coping with Illness, Programmer, applications  Mental Health Interventions   Mental Health Discussed/Reviewed Mental Health Reviewed, Coping Strategies  Nutrition Interventions   Nutrition Discussed/Reviewed Nutrition Reviewed, Fluid intake  Pharmacy Interventions   Pharmacy Dicussed/Reviewed Pharmacy Topics Reviewed, Affording Medications  Safety Interventions   Safety Discussed/Reviewed Safety Discussed, Home Safety  Home Safety Assistive Devices, Refer for community resources                 SDOH assessments and interventions completed:  No     Care Coordination Interventions:  Yes, provided   Follow up plan: Follow up call scheduled for  03/01/23    Encounter Outcome:  Patient Visit Completed {THN Tip this will not be part of the note when signed-REQUIRED REPORT FIELD DO NOT DELETE (Optional):[id number]  Cacie Gaskins L. Noelle Penner, RN, BSN, CCM, Care Management Coordinator [phone]

## 2023-02-16 NOTE — Patient Instructions (Signed)
Visit Information  Thank you for taking time to visit with me today. Please don't hesitate to contact me if I can be of assistance to you.   Following are the goals we discussed today:   Goals Addressed             This Visit's Progress    Resource to assist with home care -care coordination services       Interventions Today    Flowsheet Row Most Recent Value  Chronic Disease   Chronic disease during today's visit Other  [home services to help after changes in husband medical status]  General Interventions   General Interventions Discussed/Reviewed General Interventions Reviewed, [employer], Communication with  Communication with --  [referral to care guide]  Exercise Interventions   Exercise Discussed/Reviewed Exercise Reviewed, Physical Activity  Physical Activity Discussed/Reviewed Physical Activity Reviewed, Home Exercise Program (HEP)  Education Interventions   Education Provided Provided Education  [employer] health services, personal care services, private duty costs, council on aging]  Provided Verbal Education On Mental Health/Coping with Illness, Programmer, applications  Mental Health Interventions   Mental Health Discussed/Reviewed Mental Health Reviewed, Coping Strategies  Nutrition Interventions   Nutrition Discussed/Reviewed Nutrition Reviewed, Fluid intake  Pharmacy Interventions   Pharmacy Dicussed/Reviewed Pharmacy Topics Reviewed, Affording Medications  Safety Interventions   Safety Discussed/Reviewed Safety Discussed, Home Safety  Home Safety Assistive Devices, Refer for community resources                 Our next appointment is by telephone on 03/01/23 at 3 pm  Please call the care guide team at [phone] if you need to cancel or reschedule your appointment.   If you are experiencing a Mental Health or Behavioral Health Crisis or need someone to talk to, please call the Suicide and Crisis Lifeline: 988 call the [place] National Suicide  Prevention Lifeline: (770)260-4652 or TTY: 574-657-8555 TTY 6464897560) to talk to a trained counselor call 1-800-273-TALK (toll free, 24 hour hotline) call the [facility]: (628) 417-8584 call 911   No computer access, no preference for copy of AVS     The patient has been provided with contact information for the care management team and has been advised to call with any health related questions or concerns.   Bowden Boody L. Noelle Penner, RN, BSN, CCM, Care Management Coordinator [phone]

## 2023-02-17 ENCOUNTER — Telehealth: Payer: Self-pay | Admitting: *Deleted

## 2023-02-17 NOTE — Progress Notes (Signed)
Care Coordination Note  02/17/2023 Name: [name] MRN: [id number] DOB: 1945/08/05  [name] is a 77 y.o. year old female who is a primary care patient of Raliegh Ip, DO and is actively engaged with the care management team. I reached out to Phylliss Blakes by phone today to assist with scheduling an initial visit with the BSW  Follow up plan: Telephone appointment with care management team member scheduled for:03/01/23  [facility] Coordination Care Guide  Direct Dial: [phone]

## 2023-02-22 ENCOUNTER — Telehealth: Payer: Self-pay | Admitting: Family Medicine

## 2023-02-22 DIAGNOSIS — E1122 Type 2 diabetes mellitus with diabetic chronic kidney disease: Secondary | ICD-10-CM | POA: Diagnosis not present

## 2023-02-22 DIAGNOSIS — N2581 Secondary hyperparathyroidism of renal origin: Secondary | ICD-10-CM | POA: Diagnosis not present

## 2023-02-22 DIAGNOSIS — N184 Chronic kidney disease, stage 4 (severe): Secondary | ICD-10-CM | POA: Diagnosis not present

## 2023-02-22 DIAGNOSIS — M109 Gout, unspecified: Secondary | ICD-10-CM | POA: Diagnosis not present

## 2023-02-22 DIAGNOSIS — N183 Chronic kidney disease, stage 3 unspecified: Secondary | ICD-10-CM | POA: Diagnosis not present

## 2023-02-22 DIAGNOSIS — E559 Vitamin D deficiency, unspecified: Secondary | ICD-10-CM | POA: Diagnosis not present

## 2023-02-22 DIAGNOSIS — I129 Hypertensive chronic kidney disease with stage 1 through stage 4 chronic kidney disease, or unspecified chronic kidney disease: Secondary | ICD-10-CM | POA: Diagnosis not present

## 2023-02-22 DIAGNOSIS — D631 Anemia in chronic kidney disease: Secondary | ICD-10-CM | POA: Diagnosis not present

## 2023-02-23 LAB — LAB REPORT - SCANNED
Creatinine, POC: 111.5 mg/dL
EGFR: 28

## 2023-02-24 ENCOUNTER — Ambulatory Visit: Payer: Self-pay

## 2023-02-24 NOTE — Patient Outreach (Signed)
Care Coordination   Initial Visit Note   02/24/2023 Name: [name] MRN: [id number] DOB: 1945/06/11  [name] is a 78 y.o. year old female who sees Raliegh Ip, DO for primary care. I spoke with  Phylliss Blakes by phone today.  What matters to the patients health and wellness today?  Patient wants assistance with cleaning her home, food assistance and $25 [employer] card.   Goals Addressed   None     SDOH assessments and interventions completed:  Yes  SDOH Interventions Today    Flowsheet Row Most Recent Value  SDOH Interventions   Food Insecurity Interventions Intervention Not Indicated  Housing Interventions Intervention Not Indicated  Transportation Interventions Intervention Not Indicated  Utilities Interventions Intervention Not Indicated        Care Coordination Interventions:  Yes, provided   Follow up plan: No further intervention required.   Encounter Outcome:  Patient Visit Completed

## 2023-02-24 NOTE — Patient Instructions (Signed)
Visit Information  Thank you for taking time to visit with me today. Please don't hesitate to contact me if I can be of assistance to you.   Following are the goals we discussed today:  SW will send patient a list of food banks in the community.   If you are experiencing a Mental Health or Behavioral Health Crisis or need someone to talk to, please call 911  Patient verbalizes understanding of instructions and care plan provided today and agrees to view in MyChart. Active MyChart status and patient understanding of how to access instructions and care plan via MyChart confirmed with patient.     No further follow up required: Patient agreed to call if needed in the future.  Lysle Morales, BSW Social Worker  [phone]

## 2023-02-25 ENCOUNTER — Ambulatory Visit: Payer: Medicare Other | Admitting: Family Medicine

## 2023-03-01 ENCOUNTER — Ambulatory Visit: Payer: Self-pay | Admitting: *Deleted

## 2023-03-01 NOTE — Patient Instructions (Signed)
 Visit Information  Thank you for taking time to visit with me today. Please don't hesitate to contact me if I can be of assistance to you.   Following are the goals we discussed today:   Goals Addressed             This Visit's Progress    Resource to assist with home care -care coordination services   On track    Interventions Today    Flowsheet Row Most Recent Value  Chronic Disease   Chronic disease during today's visit Other  [follow up with Social worker outreach]  General Interventions   General Interventions Discussed/Reviewed General Interventions Reviewed, Doctor Visits, Holiday representative Visits Discussed/Reviewed Doctor Visits Reviewed, PCP, Specialist  PCP/Specialist Visits Compliance with follow-up visit  Exercise Interventions   Exercise Discussed/Reviewed Exercise Reviewed, Physical Activity, Assistive device use and maintanence  Physical Activity Discussed/Reviewed Physical Activity Reviewed, Home Exercise Program (HEP)  Education Interventions   Education Provided Provided Education  Provided Verbal Education On Eye Care, [employer], When to see the doctor  Mental Health Interventions   Mental Health Discussed/Reviewed Mental Health Reviewed, Coping Strategies  Pharmacy Interventions   Pharmacy Dicussed/Reviewed Pharmacy Topics Reviewed, Affording Medications  Safety Interventions   Safety Discussed/Reviewed Safety Reviewed, Fall Risk, Home Safety  Home Safety Assistive Devices  Advanced Directive Interventions   Advanced Directives Discussed/Reviewed Advanced Directives Discussed  [does no prefer at this time]              Our next appointment is by telephone no further scheduled appointments.  on n/a at n/a  Please call the care guide team at [phone] if you need to cancel or reschedule your appointment.   If you are experiencing a Mental Health or Behavioral Health Crisis or need someone to talk to, please call the Suicide  and Crisis Lifeline: 988 call the [place] National Suicide Prevention Lifeline: 253-174-9214 or TTY: 225-616-9780 TTY 508-881-2634) to talk to a trained counselor call 1-800-273-TALK (toll free, 24 hour hotline) call the [facility]: 703-180-2985 call 911   Patient verbalizes understanding of instructions and care plan provided today and agrees to view in MyChart. Active MyChart status and patient understanding of how to access instructions and care plan via MyChart confirmed with patient.     The patient has been provided with contact information for the care management team and has been advised to call with any health related questions or concerns.   Quatavious Rossa L. Noelle Penner, RN, BSN, [facility]  VBCI Care Management Coordinator  [phone]  Fax: [phone]

## 2023-03-02 ENCOUNTER — Other Ambulatory Visit: Payer: Self-pay | Admitting: *Deleted

## 2023-03-03 ENCOUNTER — Encounter: Payer: Self-pay | Admitting: Family Medicine

## 2023-03-03 ENCOUNTER — Ambulatory Visit: Payer: Medicare Other | Admitting: Family Medicine

## 2023-03-03 VITALS — BP 132/76 | HR 64 | Temp 98.3°F | Ht 61.0 in | Wt 146.0 lb

## 2023-03-03 DIAGNOSIS — M1A09X1 Idiopathic chronic gout, multiple sites, with tophus (tophi): Secondary | ICD-10-CM | POA: Diagnosis not present

## 2023-03-03 DIAGNOSIS — Z7409 Other reduced mobility: Secondary | ICD-10-CM | POA: Diagnosis not present

## 2023-03-03 DIAGNOSIS — N3941 Urge incontinence: Secondary | ICD-10-CM

## 2023-03-03 DIAGNOSIS — Z23 Encounter for immunization: Secondary | ICD-10-CM

## 2023-03-03 DIAGNOSIS — M2550 Pain in unspecified joint: Secondary | ICD-10-CM | POA: Diagnosis not present

## 2023-03-03 NOTE — Progress Notes (Addendum)
Subjective: CC: F2F for [facility] PT PCP: Raliegh Ip, DO UEA:VWUJW [name] is a 77 y.o. female presenting to clinic today for:  1.  Physical deconditioning secondary to polyarthralgia, chronic gout Patient is accompanied by her husband.  She notes difficulty with ambulation secondary to chronic pain.  She moves very little as a result.  She is coming into the office today with a wheelchair.  Interested in home health.  2.  Urinary incontinence Patient reports that she has urinary incontinence.  This is mostly when she has to sit or be out for too long and cannot make it to the bathroom in time.  Denies any dysuria or hematuria.  Wanting to know if she can get help with incontinence supplies   ROS: Per HPI  Allergies  Allergen Reactions   Aspirin Nausea And Vomiting   Ciprofloxacin Other (See Comments)    Upset Stomach   Codeine Nausea And Vomiting    Makes me sick    Hydrocodone-Acetaminophen     Other reaction(s): Unknown  09/14/22: Patient states that when she takes any type of pain medication she starts vomiting blood. States she was told not to take any OTC pain medication.   Losartan     Hyperkalemia    Micardis [Telmisartan] Other (See Comments)    unknown   Nexium [Esomeprazole Magnesium] Other (See Comments)    Causes internal bleeding   Niaspan [Niacin Er] Other (See Comments)    Reaction is unknown   Nsaids     Other reaction(s): Unknown   Onglyza [Saxagliptin] Other (See Comments)    Reaction is unknown   Other     No otc pain medications   Oxycodone Hcl     Other reaction(s): Unknown   Rofecoxib Other (See Comments)    unknown   Simvastatin    Tequin [Gatifloxacin] Other (See Comments)    Reaction is unknown   Welchol [Colesevelam Hcl]    Amoxicillin Nausea And Vomiting and Rash    Has patient had a PCN reaction causing immediate rash, facial/tongue/throat swelling, SOB or lightheadedness with hypotension: Yes Has patient had a PCN reaction causing  severe rash involving mucus membranes or skin necrosis: no  Has patient had a PCN reaction that required hospitalization No Has patient had a PCN reaction occurring within the last 10 years: No If all of the above answers are "NO", then may proceed with Cephalosporin use.    Past Medical History:  Diagnosis Date   Anal fistula    Anemia in chronic renal disease    Aranesp injection --  when Hg <11, last injection 12-24-14.   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)    History of colon polyps    benign   History of esophagitis    History of GI bleed    upper 2009  due to esophagitis  &  2002  due to Mallory-Weiss tear   History of hyperkalemia    pt had previously been canceled twice dos due to elevated K+, 07-29-2016 last date cancelled -- pt visited pcp same day treated w/ kayexelate and K+ came down, pt brought all her medication's in to pcp office and found pt was taking losartan that had been discontinued due to ckd, pcp stated this was cause of elevated K+   History of Mallory-Weiss syndrome    12/ 2002--  resolved   History of rectal abscess    12-29-2004  bedside I & D   Hyperlipidemia    Hypertension     Hypothyroidism    LAFB (left anterior fascicular block)    Right bundle branch block    Sacral decubitus ulcer    since 2014- 01-02-15 remains with wound" gauze dressing changes daily.   Type II diabetes mellitus (HCC)     Current Outpatient Medications:    Accu-Chek Softclix Lancets lancets, Use as instructed, Disp: 100 each, Rfl: 12   amLODipine (NORVASC) 5 MG tablet, Take 1 tablet (5 mg total) by mouth daily., Disp: 90 tablet, Rfl: 3   atorvastatin (LIPITOR) 10 MG tablet, Take 1 tablet (10 mg total) by mouth daily., Disp: 90 tablet, Rfl: 03   busPIRone (BUSPAR) 5 MG tablet, Take 0.5-1 tablets (2.5-5 mg total) by mouth 2 (two) times daily as needed., Disp: 20 tablet, Rfl: 2   carvedilol (COREG) 12.5 MG tablet, TAKE 1 TABLET TWICE DAILY WITH A MEAL, Disp: 180 tablet, Rfl: 3   cholecalciferol (VITAMIN D3) 25 MCG (1000 UNIT) tablet, TAKE 1 TABLET EVERY DAY, Disp: 90 tablet, Rfl: 3   colchicine 0.6 MG tablet, Take 0.6 mg by mouth daily as needed., Disp: , Rfl:    Febuxostat (ULORIC) 80 MG TABS, 1 tablet Orally Once a day to manage gout for 30 day(s), Disp: , Rfl:    Febuxostat 80 MG TABS, Take by mouth., Disp: , Rfl:    fluticasone (FLONASE) 50 MCG/ACT nasal spray, Place 2 sprays into both nostrils daily. Use 2 sprays in each nostril daily, Disp: 16 g, Rfl: 3   furosemide (LASIX) 40 MG tablet, Take 1 tablet twice weekly, Disp: 26 tablet, Rfl: 3   glucose blood (ACCU-CHEK AVIVA PLUS) test strip, Use as instructed, Disp: 100 each, Rfl: 12   Insulin Glargine (BASAGLAR KWIKPEN) 100 UNIT/ML, Inject 10 Units into the skin daily with breakfast., Disp: 45 mL, Rfl: 5   Lancets Misc. MISC, Use as directed to check blood sugar up to 4x daily. E11.65, Disp: 300 each, Rfl: 3   levocetirizine (XYZAL) 5 MG tablet, Take 5 mg by mouth daily., Disp: , Rfl:    levothyroxine (SYNTHROID) 50 MCG tablet, Take 1 tablet (50 mcg total) by mouth daily before breakfast., Disp: 90 tablet, Rfl: 1   losartan (COZAAR) 100 MG  tablet, 1 tablet Orally Once a day, Disp: , Rfl:    meclizine (ANTIVERT) 25 MG tablet, Take 0.5-1 tablets (12.5-25 mg total) by mouth 3 (three) times daily as needed for dizziness., Disp: 30 tablet, Rfl: 0   metoprolol succinate (TOPROL-XL) 50 MG 24 hr tablet, 1 tablet Orally Once a day, Disp: , Rfl:    mupirocin ointment (BACTROBAN) 2 %, Apply topically as directed.,  Disp: , Rfl:    pantoprazole (PROTONIX) 20 MG tablet, Take 1 tablet (20 mg total) by mouth daily., Disp: 90 tablet, Rfl: 3   prednisoLONE acetate (PRED FORTE) 1 % ophthalmic suspension, Place 1 drop into the right eye 4 (four) times daily., Disp: 5 mL, Rfl: 0   silver sulfADIAZINE (SILVADENE) 1 % cream, Apply topically as directed., Disp: , Rfl:    ULTICARE MICRO PEN NEEDLES 32G X 4 MM MISC, TWICE DAILY, Disp: 100 each, Rfl: 5 Social History   Socioeconomic History   Marital status: Married    Spouse name: [name]   Number of children: 0   Years of education: 12   Highest education level: Not on file  Occupational History   Occupation: Retired    Associate Professor: RETIRED  Tobacco Use   Smoking status: Never   Smokeless tobacco: Never  Vaping Use   Vaping status: Never Used  Substance and Sexual Activity   Alcohol use: No   Drug use: No   Sexual activity: Not Currently  Other Topics Concern   Not on file  Social History Narrative   Lives with husband.    Lost her mother in 1999   Caffeine use: none      01/19/22 No kids, support each other. Spouse diagnosed with cancer recently. She is legally blind but cares for spouse and vice versa. As of 2023 married for 55 years per pt   Social Determinants of Health   Financial Resource Strain: Low Risk  (04/27/2022)   Overall Financial Resource Strain (CARDIA)    Difficulty of Paying Living Expenses: Not hard at all  Food Insecurity: Food Insecurity Present (02/24/2023)   Hunger Vital Sign    Worried About Running Out of Food in the Last Year: Never true    Ran Out of Food in the  Last Year: Sometimes true  Transportation Needs: No Transportation Needs (02/24/2023)   PRAPARE - Administrator, Civil Service (Medical): No    Lack of Transportation (Non-Medical): No  Physical Activity: Insufficiently Active (04/27/2022)   Exercise Vital Sign    Days of Exercise per Week: 3 days    Minutes of Exercise per Session: 30 min  Stress: No Stress Concern Present (04/27/2022)   [employer] of Occupational Health - Occupational Stress Questionnaire    Feeling of Stress : Not at all  Social Connections: Moderately Integrated (04/27/2022)   Social Connection and Isolation Panel [NHANES]    Frequency of Communication with Friends and Family: More than three times a week    Frequency of Social Gatherings with Friends and Family: More than three times a week    Attends Religious Services: 1 to 4 times per year    Active Member of [employer] or Organizations: No    Attends Banker Meetings: Never    Marital Status: Married  Catering manager Violence: Not At Risk (04/27/2022)   Humiliation, Afraid, Rape, and Kick questionnaire    Fear of Current or Ex-Partner: No    Emotionally Abused: No    Physically Abused: No    Sexually Abused: No   Family History  Problem Relation Age of Onset   Colon cancer Father 63   Prostate cancer Father    Diabetes Father    Coronary artery disease Mother 15   Heart disease Mother    Diabetes Mother    Coronary artery disease Sister 58   Diabetes Sister    Heart disease Sister    Diabetes Sister  Diabetes Sister    Diabetes Sister    Diabetes Sister    Diabetes Sister    Diabetes Maternal Grandmother    Esophageal cancer Neg Hx    Stomach cancer Neg Hx    Rectal cancer Neg Hx     Objective: Office vital signs reviewed. BP 132/76   Pulse 64   Temp 98.3 F (36.8 C)   Ht 5\' 1"  (1.549 m)   Wt 146 lb (66.2 kg)   SpO2 97%   BMI 27.59 kg/m   Physical Examination:  General: Awake, alert, chronically  ill-appearing female, No acute distress HEENT: sclera white, MMM Cardio: regular rate and rhythm, S1S2 heard, no murmurs appreciated Pulm: clear to auscultation bilaterally, no wheezes, rhonchi or rales; normal work of breathing on room air MSK: Arrives in wheelchair.  Osteoarthritic changes noted throughout.  No muscle wasting or atrophy appreciated.  Able to move all limbs independently  Assessment/ Plan: 77 y.o. female   Polyarthralgia - Plan: Ambulatory referral to Home Health  Idiopathic chronic gout, multiple sites, with tophus (tophi) - Plan: Ambulatory referral to Home Health  Impaired mobility and endurance - Plan: Ambulatory referral to Home Health  Urge incontinence of urine - Plan: Incontinence supply  Encounter for immunization - Plan: Flu Vaccine Trivalent High Dose (Fluad)  Referral to home health for physical therapy.  She arrives today in wheelchair and hopefully can get her more ambulatory at home.  I have ordered incontinence supplies.  Not sure if insurance will cover but will try and submit.  We discussed she may need to get these from a pharmacy on her own Due to this patient's indefinite urinary incontinence (UI), it is medically necessary for them to use diapers/pull-ups, gloves and bed pads up to 200/month to best manage their UI and maintain their skin integrity without breakdown daily.  Influenza vaccination administered.   Raliegh Ip, DO [facility] [phone]

## 2023-03-04 ENCOUNTER — Telehealth: Payer: Self-pay | Admitting: Family Medicine

## 2023-03-04 DIAGNOSIS — E034 Atrophy of thyroid (acquired): Secondary | ICD-10-CM

## 2023-03-04 DIAGNOSIS — I152 Hypertension secondary to endocrine disorders: Secondary | ICD-10-CM

## 2023-03-04 DIAGNOSIS — N1831 Chronic kidney disease, stage 3a: Secondary | ICD-10-CM

## 2023-03-04 DIAGNOSIS — E1159 Type 2 diabetes mellitus with other circulatory complications: Secondary | ICD-10-CM

## 2023-03-04 DIAGNOSIS — Z794 Long term (current) use of insulin: Secondary | ICD-10-CM

## 2023-03-04 DIAGNOSIS — E1169 Type 2 diabetes mellitus with other specified complication: Secondary | ICD-10-CM

## 2023-03-04 MED ORDER — ATORVASTATIN CALCIUM 10 MG PO TABS: 10.0000 mg | ORAL_TABLET | Freq: Every day | ORAL | Status: DC

## 2023-03-04 MED ORDER — AMLODIPINE BESYLATE 5 MG PO TABS: 5.0000 mg | ORAL_TABLET | Freq: Every day | ORAL | 3 refills | Status: DC

## 2023-03-04 MED ORDER — BASAGLAR KWIKPEN 100 UNIT/ML ~~LOC~~ SOPN: 10.0000 [IU] | PEN_INJECTOR | Freq: Every day | SUBCUTANEOUS | 5 refills | Status: DC

## 2023-03-04 MED ORDER — PANTOPRAZOLE SODIUM 20 MG PO TBEC: 20.0000 mg | DELAYED_RELEASE_TABLET | Freq: Every day | ORAL | 3 refills | Status: DC

## 2023-03-04 MED ORDER — CARVEDILOL 12.5 MG PO TABS: ORAL_TABLET | ORAL | 3 refills | Status: DC

## 2023-03-04 MED ORDER — VITAMIN D3 25 MCG (1000 UNIT) PO TABS: 1000.0000 [IU] | ORAL_TABLET | Freq: Every day | ORAL | 3 refills | Status: DC

## 2023-03-04 MED ORDER — FUROSEMIDE 40 MG PO TABS: ORAL_TABLET | ORAL | 3 refills | Status: DC

## 2023-03-04 MED ORDER — LEVOTHYROXINE SODIUM 50 MCG PO TABS: 50.0000 ug | ORAL_TABLET | Freq: Every day | ORAL | 1 refills | Status: DC

## 2023-03-04 NOTE — Telephone Encounter (Signed)
Called and spoke with patient and she told me the rxs that she needed refilled. Sent refills to mail order per patients request

## 2023-03-04 NOTE — Telephone Encounter (Signed)
Pt says that she needs refills on 8 of her prescriptions. I asked for the rx and she said she did not know and to just let Dr Nadine Counts know. Pt says that she forgot to tell the dr when she was here yesterday.

## 2023-03-09 DIAGNOSIS — F411 Generalized anxiety disorder: Secondary | ICD-10-CM | POA: Diagnosis not present

## 2023-03-09 DIAGNOSIS — E039 Hypothyroidism, unspecified: Secondary | ICD-10-CM | POA: Diagnosis not present

## 2023-03-09 DIAGNOSIS — M1A09X1 Idiopathic chronic gout, multiple sites, with tophus (tophi): Secondary | ICD-10-CM | POA: Diagnosis not present

## 2023-03-09 DIAGNOSIS — K603 Anal fistula, unspecified: Secondary | ICD-10-CM | POA: Diagnosis not present

## 2023-03-09 DIAGNOSIS — H35043 Retinal micro-aneurysms, unspecified, bilateral: Secondary | ICD-10-CM | POA: Diagnosis not present

## 2023-03-09 DIAGNOSIS — I129 Hypertensive chronic kidney disease with stage 1 through stage 4 chronic kidney disease, or unspecified chronic kidney disease: Secondary | ICD-10-CM | POA: Diagnosis not present

## 2023-03-09 DIAGNOSIS — D631 Anemia in chronic kidney disease: Secondary | ICD-10-CM | POA: Diagnosis not present

## 2023-03-09 DIAGNOSIS — E11319 Type 2 diabetes mellitus with unspecified diabetic retinopathy without macular edema: Secondary | ICD-10-CM | POA: Diagnosis not present

## 2023-03-09 DIAGNOSIS — H353134 Nonexudative age-related macular degeneration, bilateral, advanced atrophic with subfoveal involvement: Secondary | ICD-10-CM | POA: Diagnosis not present

## 2023-03-09 DIAGNOSIS — E1143 Type 2 diabetes mellitus with diabetic autonomic (poly)neuropathy: Secondary | ICD-10-CM | POA: Diagnosis not present

## 2023-03-09 DIAGNOSIS — K219 Gastro-esophageal reflux disease without esophagitis: Secondary | ICD-10-CM | POA: Diagnosis not present

## 2023-03-09 DIAGNOSIS — N183 Chronic kidney disease, stage 3 unspecified: Secondary | ICD-10-CM | POA: Diagnosis not present

## 2023-03-09 DIAGNOSIS — F32A Depression, unspecified: Secondary | ICD-10-CM | POA: Diagnosis not present

## 2023-03-09 DIAGNOSIS — I2510 Atherosclerotic heart disease of native coronary artery without angina pectoris: Secondary | ICD-10-CM | POA: Diagnosis not present

## 2023-03-09 DIAGNOSIS — K5730 Diverticulosis of large intestine without perforation or abscess without bleeding: Secondary | ICD-10-CM | POA: Diagnosis not present

## 2023-03-09 DIAGNOSIS — D500 Iron deficiency anemia secondary to blood loss (chronic): Secondary | ICD-10-CM | POA: Diagnosis not present

## 2023-03-09 DIAGNOSIS — I452 Bifascicular block: Secondary | ICD-10-CM | POA: Diagnosis not present

## 2023-03-09 DIAGNOSIS — M170 Bilateral primary osteoarthritis of knee: Secondary | ICD-10-CM | POA: Diagnosis not present

## 2023-03-09 DIAGNOSIS — E1122 Type 2 diabetes mellitus with diabetic chronic kidney disease: Secondary | ICD-10-CM | POA: Diagnosis not present

## 2023-03-09 DIAGNOSIS — R32 Unspecified urinary incontinence: Secondary | ICD-10-CM | POA: Diagnosis not present

## 2023-03-09 DIAGNOSIS — M810 Age-related osteoporosis without current pathological fracture: Secondary | ICD-10-CM | POA: Diagnosis not present

## 2023-03-09 DIAGNOSIS — E559 Vitamin D deficiency, unspecified: Secondary | ICD-10-CM | POA: Diagnosis not present

## 2023-03-09 DIAGNOSIS — E782 Mixed hyperlipidemia: Secondary | ICD-10-CM | POA: Diagnosis not present

## 2023-03-09 DIAGNOSIS — K3184 Gastroparesis: Secondary | ICD-10-CM | POA: Diagnosis not present

## 2023-03-09 DIAGNOSIS — Z794 Long term (current) use of insulin: Secondary | ICD-10-CM | POA: Diagnosis not present

## 2023-03-14 DIAGNOSIS — N183 Chronic kidney disease, stage 3 unspecified: Secondary | ICD-10-CM | POA: Diagnosis not present

## 2023-03-14 DIAGNOSIS — D631 Anemia in chronic kidney disease: Secondary | ICD-10-CM | POA: Diagnosis not present

## 2023-03-14 DIAGNOSIS — I129 Hypertensive chronic kidney disease with stage 1 through stage 4 chronic kidney disease, or unspecified chronic kidney disease: Secondary | ICD-10-CM | POA: Diagnosis not present

## 2023-03-14 DIAGNOSIS — E11319 Type 2 diabetes mellitus with unspecified diabetic retinopathy without macular edema: Secondary | ICD-10-CM | POA: Diagnosis not present

## 2023-03-14 DIAGNOSIS — M1A09X1 Idiopathic chronic gout, multiple sites, with tophus (tophi): Secondary | ICD-10-CM | POA: Diagnosis not present

## 2023-03-14 DIAGNOSIS — E1122 Type 2 diabetes mellitus with diabetic chronic kidney disease: Secondary | ICD-10-CM | POA: Diagnosis not present

## 2023-03-16 DIAGNOSIS — E11319 Type 2 diabetes mellitus with unspecified diabetic retinopathy without macular edema: Secondary | ICD-10-CM | POA: Diagnosis not present

## 2023-03-16 DIAGNOSIS — N183 Chronic kidney disease, stage 3 unspecified: Secondary | ICD-10-CM | POA: Diagnosis not present

## 2023-03-16 DIAGNOSIS — E1122 Type 2 diabetes mellitus with diabetic chronic kidney disease: Secondary | ICD-10-CM | POA: Diagnosis not present

## 2023-03-16 DIAGNOSIS — D631 Anemia in chronic kidney disease: Secondary | ICD-10-CM | POA: Diagnosis not present

## 2023-03-16 DIAGNOSIS — M1A09X1 Idiopathic chronic gout, multiple sites, with tophus (tophi): Secondary | ICD-10-CM | POA: Diagnosis not present

## 2023-03-16 DIAGNOSIS — I129 Hypertensive chronic kidney disease with stage 1 through stage 4 chronic kidney disease, or unspecified chronic kidney disease: Secondary | ICD-10-CM | POA: Diagnosis not present

## 2023-03-21 DIAGNOSIS — E1122 Type 2 diabetes mellitus with diabetic chronic kidney disease: Secondary | ICD-10-CM | POA: Diagnosis not present

## 2023-03-21 DIAGNOSIS — D631 Anemia in chronic kidney disease: Secondary | ICD-10-CM | POA: Diagnosis not present

## 2023-03-21 DIAGNOSIS — I129 Hypertensive chronic kidney disease with stage 1 through stage 4 chronic kidney disease, or unspecified chronic kidney disease: Secondary | ICD-10-CM | POA: Diagnosis not present

## 2023-03-21 DIAGNOSIS — E11319 Type 2 diabetes mellitus with unspecified diabetic retinopathy without macular edema: Secondary | ICD-10-CM | POA: Diagnosis not present

## 2023-03-21 DIAGNOSIS — M1A09X1 Idiopathic chronic gout, multiple sites, with tophus (tophi): Secondary | ICD-10-CM | POA: Diagnosis not present

## 2023-03-21 DIAGNOSIS — N183 Chronic kidney disease, stage 3 unspecified: Secondary | ICD-10-CM | POA: Diagnosis not present

## 2023-03-22 DIAGNOSIS — M79676 Pain in unspecified toe(s): Secondary | ICD-10-CM | POA: Diagnosis not present

## 2023-03-22 DIAGNOSIS — E1142 Type 2 diabetes mellitus with diabetic polyneuropathy: Secondary | ICD-10-CM | POA: Diagnosis not present

## 2023-03-22 DIAGNOSIS — L84 Corns and callosities: Secondary | ICD-10-CM | POA: Diagnosis not present

## 2023-03-22 DIAGNOSIS — B351 Tinea unguium: Secondary | ICD-10-CM | POA: Diagnosis not present

## 2023-03-23 DIAGNOSIS — I129 Hypertensive chronic kidney disease with stage 1 through stage 4 chronic kidney disease, or unspecified chronic kidney disease: Secondary | ICD-10-CM | POA: Diagnosis not present

## 2023-03-23 DIAGNOSIS — E1122 Type 2 diabetes mellitus with diabetic chronic kidney disease: Secondary | ICD-10-CM | POA: Diagnosis not present

## 2023-03-23 DIAGNOSIS — D631 Anemia in chronic kidney disease: Secondary | ICD-10-CM | POA: Diagnosis not present

## 2023-03-23 DIAGNOSIS — M1A09X1 Idiopathic chronic gout, multiple sites, with tophus (tophi): Secondary | ICD-10-CM | POA: Diagnosis not present

## 2023-03-23 DIAGNOSIS — E11319 Type 2 diabetes mellitus with unspecified diabetic retinopathy without macular edema: Secondary | ICD-10-CM | POA: Diagnosis not present

## 2023-03-23 DIAGNOSIS — N183 Chronic kidney disease, stage 3 unspecified: Secondary | ICD-10-CM | POA: Diagnosis not present

## 2023-03-25 HISTORY — PX: SKIN CANCER EXCISION: SHX779

## 2023-03-29 DIAGNOSIS — D485 Neoplasm of uncertain behavior of skin: Secondary | ICD-10-CM | POA: Diagnosis not present

## 2023-03-29 DIAGNOSIS — L814 Other melanin hyperpigmentation: Secondary | ICD-10-CM | POA: Diagnosis not present

## 2023-03-29 DIAGNOSIS — D0461 Carcinoma in situ of skin of right upper limb, including shoulder: Secondary | ICD-10-CM | POA: Diagnosis not present

## 2023-03-29 DIAGNOSIS — L821 Other seborrheic keratosis: Secondary | ICD-10-CM | POA: Diagnosis not present

## 2023-03-30 DIAGNOSIS — I129 Hypertensive chronic kidney disease with stage 1 through stage 4 chronic kidney disease, or unspecified chronic kidney disease: Secondary | ICD-10-CM | POA: Diagnosis not present

## 2023-03-30 DIAGNOSIS — E1122 Type 2 diabetes mellitus with diabetic chronic kidney disease: Secondary | ICD-10-CM | POA: Diagnosis not present

## 2023-03-30 DIAGNOSIS — D631 Anemia in chronic kidney disease: Secondary | ICD-10-CM | POA: Diagnosis not present

## 2023-03-30 DIAGNOSIS — N183 Chronic kidney disease, stage 3 unspecified: Secondary | ICD-10-CM | POA: Diagnosis not present

## 2023-03-30 DIAGNOSIS — E11319 Type 2 diabetes mellitus with unspecified diabetic retinopathy without macular edema: Secondary | ICD-10-CM | POA: Diagnosis not present

## 2023-03-30 DIAGNOSIS — M1A09X1 Idiopathic chronic gout, multiple sites, with tophus (tophi): Secondary | ICD-10-CM | POA: Diagnosis not present

## 2023-04-01 ENCOUNTER — Telehealth: Payer: Self-pay

## 2023-04-01 DIAGNOSIS — Z794 Long term (current) use of insulin: Secondary | ICD-10-CM

## 2023-04-01 NOTE — Telephone Encounter (Signed)
Patient would like [employer] application mailed to her. She will return it at her appt with Dr. Nadine Counts 04/20/23.

## 2023-04-04 DIAGNOSIS — N183 Chronic kidney disease, stage 3 unspecified: Secondary | ICD-10-CM | POA: Diagnosis not present

## 2023-04-04 DIAGNOSIS — M1A09X1 Idiopathic chronic gout, multiple sites, with tophus (tophi): Secondary | ICD-10-CM | POA: Diagnosis not present

## 2023-04-04 DIAGNOSIS — E11319 Type 2 diabetes mellitus with unspecified diabetic retinopathy without macular edema: Secondary | ICD-10-CM | POA: Diagnosis not present

## 2023-04-04 DIAGNOSIS — E1122 Type 2 diabetes mellitus with diabetic chronic kidney disease: Secondary | ICD-10-CM | POA: Diagnosis not present

## 2023-04-04 DIAGNOSIS — I129 Hypertensive chronic kidney disease with stage 1 through stage 4 chronic kidney disease, or unspecified chronic kidney disease: Secondary | ICD-10-CM | POA: Diagnosis not present

## 2023-04-04 DIAGNOSIS — D631 Anemia in chronic kidney disease: Secondary | ICD-10-CM | POA: Diagnosis not present

## 2023-04-06 NOTE — Progress Notes (Signed)
Pharmacy Medication Assistance Program Note    05/09/2023  Patient ID: [name], female   DOB: 11/19/1945, 77 y.o.   MRN: [id number]     04/01/2023 04/06/2023  Outreach Medication One  Initial Outreach Date (Medication One) 04/01/2023   Manufacturer Medication One [facility] Basaglar Basaglar  Dose of Basaglar  [facility]  Type of Forensic scientist Assistance  Date Application Sent to Patient  04/06/2023  Application Items Requested  Application  Date Application Sent to Prescriber  05/02/2023  Date Application Received From Patient  04/29/2023  Application Items Received From Patient  Application  Date Application Received From Provider  05/06/2023  Date Application Submitted to Manufacturer  05/09/2023  Method Application Sent to Manufacturer  Fax    Please send RX to [name] pharmacy for patients renewal.

## 2023-04-08 DIAGNOSIS — H353134 Nonexudative age-related macular degeneration, bilateral, advanced atrophic with subfoveal involvement: Secondary | ICD-10-CM | POA: Diagnosis not present

## 2023-04-08 DIAGNOSIS — E039 Hypothyroidism, unspecified: Secondary | ICD-10-CM | POA: Diagnosis not present

## 2023-04-08 DIAGNOSIS — E1122 Type 2 diabetes mellitus with diabetic chronic kidney disease: Secondary | ICD-10-CM | POA: Diagnosis not present

## 2023-04-08 DIAGNOSIS — I2510 Atherosclerotic heart disease of native coronary artery without angina pectoris: Secondary | ICD-10-CM | POA: Diagnosis not present

## 2023-04-08 DIAGNOSIS — E11319 Type 2 diabetes mellitus with unspecified diabetic retinopathy without macular edema: Secondary | ICD-10-CM | POA: Diagnosis not present

## 2023-04-08 DIAGNOSIS — E782 Mixed hyperlipidemia: Secondary | ICD-10-CM | POA: Diagnosis not present

## 2023-04-08 DIAGNOSIS — Z794 Long term (current) use of insulin: Secondary | ICD-10-CM | POA: Diagnosis not present

## 2023-04-08 DIAGNOSIS — K219 Gastro-esophageal reflux disease without esophagitis: Secondary | ICD-10-CM | POA: Diagnosis not present

## 2023-04-08 DIAGNOSIS — D631 Anemia in chronic kidney disease: Secondary | ICD-10-CM | POA: Diagnosis not present

## 2023-04-08 DIAGNOSIS — F411 Generalized anxiety disorder: Secondary | ICD-10-CM | POA: Diagnosis not present

## 2023-04-08 DIAGNOSIS — F32A Depression, unspecified: Secondary | ICD-10-CM | POA: Diagnosis not present

## 2023-04-08 DIAGNOSIS — R32 Unspecified urinary incontinence: Secondary | ICD-10-CM | POA: Diagnosis not present

## 2023-04-08 DIAGNOSIS — M1A09X1 Idiopathic chronic gout, multiple sites, with tophus (tophi): Secondary | ICD-10-CM | POA: Diagnosis not present

## 2023-04-08 DIAGNOSIS — K5730 Diverticulosis of large intestine without perforation or abscess without bleeding: Secondary | ICD-10-CM | POA: Diagnosis not present

## 2023-04-08 DIAGNOSIS — K3184 Gastroparesis: Secondary | ICD-10-CM | POA: Diagnosis not present

## 2023-04-08 DIAGNOSIS — E559 Vitamin D deficiency, unspecified: Secondary | ICD-10-CM | POA: Diagnosis not present

## 2023-04-08 DIAGNOSIS — K603 Anal fistula, unspecified: Secondary | ICD-10-CM | POA: Diagnosis not present

## 2023-04-08 DIAGNOSIS — M170 Bilateral primary osteoarthritis of knee: Secondary | ICD-10-CM | POA: Diagnosis not present

## 2023-04-08 DIAGNOSIS — E1143 Type 2 diabetes mellitus with diabetic autonomic (poly)neuropathy: Secondary | ICD-10-CM | POA: Diagnosis not present

## 2023-04-08 DIAGNOSIS — H35043 Retinal micro-aneurysms, unspecified, bilateral: Secondary | ICD-10-CM | POA: Diagnosis not present

## 2023-04-08 DIAGNOSIS — I452 Bifascicular block: Secondary | ICD-10-CM | POA: Diagnosis not present

## 2023-04-08 DIAGNOSIS — D500 Iron deficiency anemia secondary to blood loss (chronic): Secondary | ICD-10-CM | POA: Diagnosis not present

## 2023-04-08 DIAGNOSIS — M810 Age-related osteoporosis without current pathological fracture: Secondary | ICD-10-CM | POA: Diagnosis not present

## 2023-04-08 DIAGNOSIS — N183 Chronic kidney disease, stage 3 unspecified: Secondary | ICD-10-CM | POA: Diagnosis not present

## 2023-04-08 DIAGNOSIS — I129 Hypertensive chronic kidney disease with stage 1 through stage 4 chronic kidney disease, or unspecified chronic kidney disease: Secondary | ICD-10-CM | POA: Diagnosis not present

## 2023-04-12 ENCOUNTER — Ambulatory Visit (INDEPENDENT_AMBULATORY_CARE_PROVIDER_SITE_OTHER): Payer: Medicare Other

## 2023-04-12 DIAGNOSIS — D500 Iron deficiency anemia secondary to blood loss (chronic): Secondary | ICD-10-CM

## 2023-04-12 DIAGNOSIS — I129 Hypertensive chronic kidney disease with stage 1 through stage 4 chronic kidney disease, or unspecified chronic kidney disease: Secondary | ICD-10-CM

## 2023-04-12 DIAGNOSIS — M1A08X1 Idiopathic chronic gout, vertebrae, with tophus (tophi): Secondary | ICD-10-CM | POA: Diagnosis not present

## 2023-04-12 DIAGNOSIS — N183 Chronic kidney disease, stage 3 unspecified: Secondary | ICD-10-CM | POA: Diagnosis not present

## 2023-04-12 DIAGNOSIS — K3184 Gastroparesis: Secondary | ICD-10-CM | POA: Diagnosis not present

## 2023-04-12 DIAGNOSIS — D631 Anemia in chronic kidney disease: Secondary | ICD-10-CM | POA: Diagnosis not present

## 2023-04-12 DIAGNOSIS — Z794 Long term (current) use of insulin: Secondary | ICD-10-CM | POA: Diagnosis not present

## 2023-04-12 DIAGNOSIS — E11319 Type 2 diabetes mellitus with unspecified diabetic retinopathy without macular edema: Secondary | ICD-10-CM

## 2023-04-12 DIAGNOSIS — E1143 Type 2 diabetes mellitus with diabetic autonomic (poly)neuropathy: Secondary | ICD-10-CM

## 2023-04-12 DIAGNOSIS — E1122 Type 2 diabetes mellitus with diabetic chronic kidney disease: Secondary | ICD-10-CM

## 2023-04-12 DIAGNOSIS — M1A09X1 Idiopathic chronic gout, multiple sites, with tophus (tophi): Secondary | ICD-10-CM | POA: Diagnosis not present

## 2023-04-18 DIAGNOSIS — I129 Hypertensive chronic kidney disease with stage 1 through stage 4 chronic kidney disease, or unspecified chronic kidney disease: Secondary | ICD-10-CM | POA: Diagnosis not present

## 2023-04-18 DIAGNOSIS — E11319 Type 2 diabetes mellitus with unspecified diabetic retinopathy without macular edema: Secondary | ICD-10-CM | POA: Diagnosis not present

## 2023-04-18 DIAGNOSIS — M1A09X1 Idiopathic chronic gout, multiple sites, with tophus (tophi): Secondary | ICD-10-CM | POA: Diagnosis not present

## 2023-04-18 DIAGNOSIS — E1122 Type 2 diabetes mellitus with diabetic chronic kidney disease: Secondary | ICD-10-CM | POA: Diagnosis not present

## 2023-04-18 DIAGNOSIS — N183 Chronic kidney disease, stage 3 unspecified: Secondary | ICD-10-CM | POA: Diagnosis not present

## 2023-04-18 DIAGNOSIS — D631 Anemia in chronic kidney disease: Secondary | ICD-10-CM | POA: Diagnosis not present

## 2023-04-19 DIAGNOSIS — C44622 Squamous cell carcinoma of skin of right upper limb, including shoulder: Secondary | ICD-10-CM | POA: Diagnosis not present

## 2023-04-19 DIAGNOSIS — D0461 Carcinoma in situ of skin of right upper limb, including shoulder: Secondary | ICD-10-CM | POA: Diagnosis not present

## 2023-04-20 ENCOUNTER — Ambulatory Visit (INDEPENDENT_AMBULATORY_CARE_PROVIDER_SITE_OTHER): Payer: Medicare Other | Admitting: Family Medicine

## 2023-04-20 ENCOUNTER — Encounter: Payer: Self-pay | Admitting: Family Medicine

## 2023-04-20 VITALS — BP 122/51 | HR 50 | Temp 98.7°F | Ht 61.0 in | Wt 146.0 lb

## 2023-04-20 DIAGNOSIS — N1831 Chronic kidney disease, stage 3a: Secondary | ICD-10-CM | POA: Diagnosis not present

## 2023-04-20 DIAGNOSIS — E1159 Type 2 diabetes mellitus with other circulatory complications: Secondary | ICD-10-CM | POA: Diagnosis not present

## 2023-04-20 DIAGNOSIS — E785 Hyperlipidemia, unspecified: Secondary | ICD-10-CM

## 2023-04-20 DIAGNOSIS — I152 Hypertension secondary to endocrine disorders: Secondary | ICD-10-CM | POA: Diagnosis not present

## 2023-04-20 DIAGNOSIS — Z794 Long term (current) use of insulin: Secondary | ICD-10-CM | POA: Diagnosis not present

## 2023-04-20 DIAGNOSIS — E034 Atrophy of thyroid (acquired): Secondary | ICD-10-CM | POA: Diagnosis not present

## 2023-04-20 DIAGNOSIS — E1122 Type 2 diabetes mellitus with diabetic chronic kidney disease: Secondary | ICD-10-CM

## 2023-04-20 DIAGNOSIS — E1169 Type 2 diabetes mellitus with other specified complication: Secondary | ICD-10-CM | POA: Diagnosis not present

## 2023-04-20 LAB — BAYER DCA HB A1C WAIVED: HB A1C (BAYER DCA - WAIVED): 6.5 % — ABNORMAL HIGH (ref 4.8–5.6)

## 2023-04-20 NOTE — Progress Notes (Signed)
Subjective: CC:DM PCP: Raliegh Ip, DO WUJ:[id number] H Marlett is a 77 y.o. female presenting to clinic today for:  1. Type 2 Diabetes with hypertension, hyperlipidemia w/ CKD3a:  She uses CGM for continuous glucose monitoring.  Sees Dr. Fransico Him for management of diabetes and has an appointment scheduled soon.  She reports no hypoglycemic episodes.  Needs to get help with her patient assistance forms to get the insulin that has been coming from the manufacturer.  She brings these forms in today.  She notes that she gets her diabetic foot exams performed with her podiatrist and they do monofilament each time.  Diabetes Health Maintenance Due  Topic Date Due   FOOT EXAM  03/30/2023   HEMOGLOBIN A1C  04/23/2023   OPHTHALMOLOGY EXAM  12/02/2023    Last A1c:  Lab Results  Component Value Date   HGBA1C 6.6 10/21/2022    ROS: No chest pain, shortness of breath or falls.  She had a recent skin cancer resected from her upper extremity on the right.  2.  Hypothyroidism Compliant with thyroid replacement.  She notes that she received a letter recently from her mail order pharmacy to state that she has a new manufacture for her meds but she has not yet started this.  She still has 2 months of her old supply left.  ROS: Per HPI  Allergies  Allergen Reactions   Aspirin Nausea And Vomiting   Ciprofloxacin Other (See Comments)    Upset Stomach   Codeine Nausea And Vomiting    Makes me sick    Hydrocodone-Acetaminophen     Other reaction(s): Unknown  09/14/22: Patient states that when she takes any type of pain medication she starts vomiting blood. States she was told not to take any OTC pain medication.   Losartan     Hyperkalemia    Micardis [Telmisartan] Other (See Comments)    unknown   Nexium [Esomeprazole Magnesium] Other (See Comments)    Causes internal bleeding   Niaspan [Niacin Er (Antihyperlipidemic)] Other (See Comments)    Reaction is unknown   Nsaids     Other  reaction(s): Unknown   Onglyza [Saxagliptin] Other (See Comments)    Reaction is unknown   Other     No otc pain medications   Oxycodone Hcl     Other reaction(s): Unknown   Rofecoxib Other (See Comments)    unknown   Simvastatin    Tequin [Gatifloxacin] Other (See Comments)    Reaction is unknown   Welchol [Colesevelam Hcl]    Amoxicillin Nausea And Vomiting and Rash    Has patient had a PCN reaction causing immediate rash, facial/tongue/throat swelling, SOB or lightheadedness with hypotension: Yes Has patient had a PCN reaction causing severe rash involving mucus membranes or skin necrosis: no  Has patient had a PCN reaction that required hospitalization No Has patient had a PCN reaction occurring within the last 10 years: No If all of the above answers are "NO", then may proceed with Cephalosporin use.    Past Medical History:  Diagnosis Date   Anal fistula    Anemia in chronic renal disease    Aranesp injection --  when Hg <11, last injection 12-24-14.   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    CKD (chronic kidney disease), stage III [facility])    nephrologist--  dr Briant Cedar-- LOV  07-09-2016   Complication of anesthesia    post-op confusion    Constipation    Coronary artery disease    Diabetic gastroparesis (HCC)    Diabetic retinopathy (HCC)    bilateral --  monitored by dr Luciana Axe   Diverticulosis of colon    GAD (generalized anxiety disorder)    GERD (gastroesophageal reflux disease)    Hip fracture requiring operative repair [facility])    History of colon polyps    benign   History of esophagitis    History of GI bleed    upper 2009  due to esophagitis  &   2002  due to Mallory-Weiss tear   History of hyperkalemia    pt had previously been canceled twice dos due to elevated K+, 07-29-2016 last date cancelled -- pt visited pcp same day treated w/ kayexelate and K+ came down, pt brought all her medication's in to pcp office and found pt was taking losartan that had been discontinued due to ckd, pcp stated this was cause of elevated K+   History of Mallory-Weiss syndrome    12/ 2002--  resolved   History of rectal abscess    12-29-2004  bedside I & D   Hyperlipidemia    Hypertension    Hypothyroidism    LAFB (left anterior fascicular block)    Right bundle branch block    Sacral decubitus ulcer    since 2014- 01-02-15 remains with wound" gauze dressing changes daily.   Type II diabetes mellitus (HCC)     Current Outpatient Medications:    Accu-Chek Softclix Lancets lancets, Use as instructed, Disp: 100 each, Rfl: 12   amLODipine (NORVASC) 5 MG tablet, Take 1 tablet (5 mg total) by mouth daily., Disp: 90 tablet, Rfl: 3   atorvastatin (LIPITOR) 10 MG tablet, Take 1 tablet (10 mg total) by mouth daily., Disp: 90 tablet, Rfl: 03   busPIRone (BUSPAR) 5 MG tablet, Take 0.5-1 tablets (2.5-5 mg total) by mouth 2 (two) times daily as needed., Disp: 20 tablet, Rfl: 2   carvedilol (COREG) 12.5 MG tablet, TAKE 1 TABLET TWICE DAILY WITH A MEAL, Disp: 180 tablet, Rfl: 3   cholecalciferol (VITAMIN D3) 25 MCG (1000 UNIT) tablet, Take 1 tablet (1,000 Units total) by mouth daily., Disp: 90 tablet, Rfl: 3   colchicine 0.6 MG tablet, Take 0.6 mg by mouth daily as needed., Disp: , Rfl:    Febuxostat (ULORIC) 80 MG TABS, 1 tablet Orally Once a day to manage gout for 30 day(s), Disp: , Rfl:    Febuxostat 80 MG TABS, Take by mouth., Disp: , Rfl:    fluticasone (FLONASE) 50 MCG/ACT nasal spray, Place 2 sprays into both nostrils daily. Use 2 sprays in each nostril daily, Disp: 16 g, Rfl: 3   furosemide (LASIX) 40 MG tablet, Take 1 tablet twice weekly, Disp: 26 tablet,  Rfl: 3   glucose blood (ACCU-CHEK AVIVA PLUS) test strip, Use as instructed, Disp: 100 each, Rfl: 12   Insulin Glargine (BASAGLAR KWIKPEN) 100 UNIT/ML, Inject 10 Units into the skin daily with breakfast., Disp: 45 mL, Rfl: 5   Lancets Misc. MISC, Use as directed to check blood sugar up to 4x daily. E11.65, Disp: 300 each, Rfl: 3   levocetirizine (XYZAL) 5 MG tablet, Take 5 mg by mouth daily.,  Disp: , Rfl:    levothyroxine (SYNTHROID) 50 MCG tablet, Take 1 tablet (50 mcg total) by mouth daily before breakfast., Disp: 90 tablet, Rfl: 1   losartan (COZAAR) 100 MG tablet, 1 tablet Orally Once a day, Disp: , Rfl:    meclizine (ANTIVERT) 25 MG tablet, Take 0.5-1 tablets (12.5-25 mg total) by mouth 3 (three) times daily as needed for dizziness., Disp: 30 tablet, Rfl: 0   metoprolol succinate (TOPROL-XL) 50 MG 24 hr tablet, 1 tablet Orally Once a day, Disp: , Rfl:    mupirocin ointment (BACTROBAN) 2 %, Apply topically as directed., Disp: , Rfl:    pantoprazole (PROTONIX) 20 MG tablet, Take 1 tablet (20 mg total) by mouth daily., Disp: 90 tablet, Rfl: 3   prednisoLONE acetate (PRED FORTE) 1 % ophthalmic suspension, Place 1 drop into the right eye 4 (four) times daily., Disp: 5 mL, Rfl: 0   silver sulfADIAZINE (SILVADENE) 1 % cream, Apply topically as directed., Disp: , Rfl:    ULTICARE MICRO PEN NEEDLES 32G X 4 MM MISC, TWICE DAILY, Disp: 100 each, Rfl: 5 Social History   Socioeconomic History   Marital status: Married    Spouse name: [name]   Number of children: 0   Years of education: 12   Highest education level: Not on file  Occupational History   Occupation: Retired    Associate Professor: RETIRED  Tobacco Use   Smoking status: Never   Smokeless tobacco: Never  Vaping Use   Vaping status: Never Used  Substance and Sexual Activity   Alcohol use: No   Drug use: No   Sexual activity: Not Currently  Other Topics Concern   Not on file  Social History Narrative   Lives with husband.    Lost her mother  in 1999   Caffeine use: none      01/19/22 No kids, support each other. Spouse diagnosed with cancer recently. She is legally blind but cares for spouse and vice versa. As of 2023 married for 55 years per pt   Social Determinants of Health   Financial Resource Strain: Low Risk  (04/27/2022)   Overall Financial Resource Strain (CARDIA)    Difficulty of Paying Living Expenses: Not hard at all  Food Insecurity: Food Insecurity Present (02/24/2023)   Hunger Vital Sign    Worried About Running Out of Food in the Last Year: Never true    Ran Out of Food in the Last Year: Sometimes true  Transportation Needs: No Transportation Needs (02/24/2023)   PRAPARE - Administrator, Civil Service (Medical): No    Lack of Transportation (Non-Medical): No  Physical Activity: Insufficiently Active (04/27/2022)   Exercise Vital Sign    Days of Exercise per Week: 3 days    Minutes of Exercise per Session: 30 min  Stress: No Stress Concern Present (04/27/2022)   [employer] of Occupational Health - Occupational Stress Questionnaire    Feeling of Stress : Not at all  Social Connections: Moderately Integrated (04/27/2022)   Social Connection and Isolation Panel [NHANES]    Frequency of Communication with Friends and Family: More than three times a week    Frequency of Social Gatherings with Friends and Family: More than three times a week    Attends Religious Services: 1 to 4 times per year    Active Member of [employer] or Organizations: No    Attends Banker Meetings: Never    Marital Status: Married  Catering manager Violence: Not At Risk (  04/27/2022)   Humiliation, Afraid, Rape, and Kick questionnaire    Fear of Current or Ex-Partner: No    Emotionally Abused: No    Physically Abused: No    Sexually Abused: No   Family History  Problem Relation Age of Onset   Colon cancer Father 30   Prostate cancer Father    Diabetes Father    Coronary artery disease Mother 90   Heart  disease Mother    Diabetes Mother    Coronary artery disease Sister 34   Diabetes Sister    Heart disease Sister    Diabetes Sister    Diabetes Sister    Diabetes Sister    Diabetes Sister    Diabetes Sister    Diabetes Maternal Grandmother    Esophageal cancer Neg Hx    Stomach cancer Neg Hx    Rectal cancer Neg Hx     Objective: Office vital signs reviewed. BP (!) 122/51   Pulse (!) 50   Temp 98.7 F (37.1 C)   Ht 5\' 1"  (1.549 m)   Wt 146 lb (66.2 kg)   SpO2 96%   BMI 27.59 kg/m   Physical Examination:  General: Awake, alert, well nourished, No acute distress HEENT: sclera white, MMM Cardio: Bradycardic with regular rhythm, S1S2 heard, no murmurs appreciated Pulm: clear to auscultation bilaterally, no wheezes, rhonchi or rales; normal work of breathing on room air Extremities: warm, well perfused, No edema, cyanosis or clubbing; +1 pulses bilaterally MSK: Arrives in wheelchair  Assessment/ Plan: 77 y.o. female   Type 2 diabetes mellitus with stage 3a chronic kidney disease, with long-term current use of insulin (HCC) - Plan: Bayer DCA Hb A1c Waived, Microalbumin / creatinine urine ratio, CMP14+EGFR  Hyperlipidemia associated with type 2 diabetes mellitus (HCC) - Plan: Lipid Panel, CMP14+EGFR  Hypertension associated with diabetes (HCC) - Plan: CMP14+EGFR  Hypothyroidism due to acquired atrophy of thyroid - Plan: TSH, T4, Free  Sugar under good control with A1c of 6.5.  Continue CGM as directed.  Keep follow-up with endocrinology.  Will CC results to him as FYI.  Urine microalbumin collected today.  Check renal function, liver enzymes and fasting lipid.  Continue to follow-up with nephrology as directed  Continue blood pressure and cholesterol medications as directed  Check thyroid levels.  We discussed that I would like to see her back in 4 months which will be about 2 months following change in Synthroid manufacturer to ensure that her levels have not gone out of  range.  Raliegh Ip, DO [facility] [phone]

## 2023-04-21 LAB — CMP14+EGFR
ALT: 6 [IU]/L (ref 0–32)
AST: 16 [IU]/L (ref 0–40)
Albumin: 3.8 g/dL (ref 3.8–4.8)
Alkaline Phosphatase: 102 [IU]/L (ref 44–121)
BUN/Creatinine Ratio: 19 (ref 12–28)
BUN: 42 mg/dL — ABNORMAL HIGH (ref 8–27)
Bilirubin Total: 0.4 mg/dL (ref 0.0–1.2)
CO2: 20 mmol/L (ref 20–29)
Calcium: 8.8 mg/dL (ref 8.7–10.3)
Chloride: 105 mmol/L (ref 96–106)
Creatinine, Ser: 2.19 mg/dL — ABNORMAL HIGH (ref 0.57–1.00)
Globulin, Total: 2.5 g/dL (ref 1.5–4.5)
Glucose: 117 mg/dL — ABNORMAL HIGH (ref 70–99)
Potassium: 5 mmol/L (ref 3.5–5.2)
Sodium: 141 mmol/L (ref 134–144)
Total Protein: 6.3 g/dL (ref 6.0–8.5)
eGFR: 23 mL/min/{1.73_m2} — ABNORMAL LOW (ref >59)

## 2023-04-21 LAB — T4, FREE: Free T4: 1.46 ng/dL (ref 0.82–1.77)

## 2023-04-21 LAB — TSH: TSH: 3.83 u[IU]/mL (ref 0.450–4.500)

## 2023-04-21 LAB — LIPID PANEL
Chol/HDL Ratio: 2.9 {ratio} (ref 0.0–4.4)
Cholesterol, Total: 113 mg/dL (ref 100–199)
HDL: 39 mg/dL — ABNORMAL LOW (ref >39)
LDL Chol Calc (NIH): 55 mg/dL (ref 0–99)
Triglycerides: 97 mg/dL (ref 0–149)
VLDL Cholesterol Cal: 19 mg/dL (ref 5–40)

## 2023-04-27 DIAGNOSIS — E1122 Type 2 diabetes mellitus with diabetic chronic kidney disease: Secondary | ICD-10-CM | POA: Diagnosis not present

## 2023-04-27 DIAGNOSIS — D631 Anemia in chronic kidney disease: Secondary | ICD-10-CM | POA: Diagnosis not present

## 2023-04-27 DIAGNOSIS — N183 Chronic kidney disease, stage 3 unspecified: Secondary | ICD-10-CM | POA: Diagnosis not present

## 2023-04-27 DIAGNOSIS — E11319 Type 2 diabetes mellitus with unspecified diabetic retinopathy without macular edema: Secondary | ICD-10-CM | POA: Diagnosis not present

## 2023-04-27 DIAGNOSIS — I129 Hypertensive chronic kidney disease with stage 1 through stage 4 chronic kidney disease, or unspecified chronic kidney disease: Secondary | ICD-10-CM | POA: Diagnosis not present

## 2023-04-27 DIAGNOSIS — M1A09X1 Idiopathic chronic gout, multiple sites, with tophus (tophi): Secondary | ICD-10-CM | POA: Diagnosis not present

## 2023-04-28 ENCOUNTER — Ambulatory Visit (INDEPENDENT_AMBULATORY_CARE_PROVIDER_SITE_OTHER): Payer: Medicare Other | Admitting: "Endocrinology

## 2023-04-28 ENCOUNTER — Encounter: Payer: Self-pay | Admitting: "Endocrinology

## 2023-04-28 VITALS — BP 130/58 | HR 64 | Ht 61.0 in | Wt 147.4 lb

## 2023-04-28 DIAGNOSIS — E782 Mixed hyperlipidemia: Secondary | ICD-10-CM | POA: Diagnosis not present

## 2023-04-28 DIAGNOSIS — N1832 Chronic kidney disease, stage 3b: Secondary | ICD-10-CM | POA: Diagnosis not present

## 2023-04-28 DIAGNOSIS — Z794 Long term (current) use of insulin: Secondary | ICD-10-CM

## 2023-04-28 DIAGNOSIS — I10 Essential (primary) hypertension: Secondary | ICD-10-CM

## 2023-04-28 DIAGNOSIS — E1122 Type 2 diabetes mellitus with diabetic chronic kidney disease: Secondary | ICD-10-CM | POA: Diagnosis not present

## 2023-04-28 DIAGNOSIS — E034 Atrophy of thyroid (acquired): Secondary | ICD-10-CM

## 2023-04-28 NOTE — Progress Notes (Signed)
04/28/2023   Endocrinology follow-up note   Subjective:    Patient ID: [name], female    DOB: [birth year].  She is being seen in follow-up in the management of uncontrolled type 2 diabetes, hypothyroidism, hypertension, hyperlipidemia.  Past Medical History:  Diagnosis Date   Anal fistula    Anemia in chronic renal disease    Aranesp injection --  when Hg <11, last injection 12-24-14.   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    CKD (chronic kidney disease), stage III [facility])    nephrologist--  dr Briant Cedar-- LOV  07-09-2016   Complication of anesthesia    post-op confusion    Constipation    Coronary artery disease    Diabetic gastroparesis (HCC)    Diabetic retinopathy (HCC)    bilateral --  monitored by dr Luciana Axe   Diverticulosis of colon    GAD (generalized anxiety disorder)    GERD (gastroesophageal reflux disease)    Hip fracture requiring operative repair [facility])    History of colon polyps    benign   History of esophagitis    History of GI bleed    upper 2009  due to esophagitis  &  2002  due to Mallory-Weiss tear   History of hyperkalemia    pt had previously been canceled twice dos due to elevated K+, 07-29-2016 last date cancelled -- pt visited pcp same day treated w/ kayexelate and K+ came down, pt brought all her medication's in to pcp office and found pt was taking losartan that had been discontinued due to ckd, pcp stated this was cause of elevated K+   History of Mallory-Weiss syndrome    12/ 2002--  resolved   History of rectal abscess    12-29-2004  bedside I & D   Hyperlipidemia    Hypertension    Hypothyroidism    LAFB (left anterior  fascicular block)    Right bundle branch block    Sacral decubitus ulcer    since 2014- 01-02-15 remains with wound" gauze dressing changes daily.   Type II diabetes mellitus (HCC)    Past Surgical History:  Procedure Laterality Date   APPLICATION OF A-CELL OF EXTREMITY N/A 04/07/2015   Procedure: A CELL PLACMENT;  Surgeon: Alena Bills Dillingham, DO;  Location: [facility] OR;  Service: Plastics;  Laterality: N/A;   CARDIOVASCULAR STRESS TEST  12/30/2004   normal perfusion study/  no ischemia or infartion/  normal LV wall function and wall motion , ef 66%   CATARACT EXTRACTION W/ INTRAOCULAR LENS  IMPLANT, BILATERAL  1995   COLONOSCOPY  2008   w/Dr.Brodie    COLONOSCOPY W/ POLYPECTOMY  last one 2008   COMPRESSION HIP SCREW Right 05/18/2014   Procedure: COMPRESSION HIP;  Surgeon: Vickki Hearing, MD;  Location: [facility] ORS;  Service: Orthopedics;  Laterality: Right;   ESOPHAGOGASTRODUODENOSCOPY  last one 01-09-2011   EVALUATION UNDER ANESTHESIA WITH FISTULECTOMY N/A 01/06/2015   Procedure: EXAM UNDER ANESTHESIA , placement of seton;  Surgeon: Avel Peace, MD;  Location: [facility] ORS;  Service: General;  Laterality: N/A;   I & D EXTREMITY N/A 04/07/2015   Procedure: IRRIGATION AND  DEBRIDEMENT ISCHIAL ULCER;  Surgeon: Alena Bills Dillingham, DO;  Location: [facility] OR;  Service: Plastics;  Laterality: N/A;   INCISION AND DRAINAGE OF WOUND N/A 09/30/2014   Procedure: IRRIGATION AND DEBRIDEMENT SACRAL WOUND, EXCISION OF PERIRECTAL TRACT WITH PLACEMENT OF ACCELL;  Surgeon: Wayland Denis, DO;  Location: [facility];  Service: Plastics;  Laterality: N/A;   LIGATION OF INTERNAL FISTULA TRACT N/A 10/22/2016   Procedure: LIGATION OF INTERNAL FISTULA TRACT;  Surgeon: Romie Levee, MD;  Location: [facility];  Service: General;  Laterality: N/A;   ORIF FEMUR FRACTURE Left 10/09/2012   Procedure: OPEN REDUCTION INTERNAL FIXATION (ORIF) DISTAL FEMUR FRACTURE;  Surgeon: Budd Palmer, MD;   Location: [facility] OR;  Service: Orthopedics;  Laterality: Left;   RETINOPATHY SURGERY Bilateral 1980's?   SKIN CANCER EXCISION  03/2023   TRANSTHORACIC ECHOCARDIOGRAM  02/18/2011   mild LVH,  ef 55-60%,  grade I diastolic dysfunction/  mild TR/  RV systolic pressure increased consistant with moderate pulmonary hypertension   Social History   Socioeconomic History   Marital status: Married    Spouse name: [name]   Number of children: 0   Years of education: 12   Highest education level: Not on file  Occupational History   Occupation: Retired    Associate Professor: RETIRED  Tobacco Use   Smoking status: Never   Smokeless tobacco: Never  Vaping Use   Vaping status: Never Used  Substance and Sexual Activity   Alcohol use: No   Drug use: No   Sexual activity: Not Currently  Other Topics Concern   Not on file  Social History Narrative   Lives with husband.    Lost her mother in 1999   Caffeine use: none      01/19/22 No kids, support each other. Spouse diagnosed with cancer recently. She is legally blind but cares for spouse and vice versa. As of 2023 married for 55 years per pt   Social Determinants of Health   Financial Resource Strain: Low Risk  (04/27/2022)   Overall Financial Resource Strain (CARDIA)    Difficulty of Paying Living Expenses: Not hard at all  Food Insecurity: Food Insecurity Present (02/24/2023)   Hunger Vital Sign    Worried About Running Out of Food in the Last Year: Never true    Ran Out of Food in the Last Year: Sometimes true  Transportation Needs: No Transportation Needs (02/24/2023)   PRAPARE - Administrator, Civil Service (Medical): No    Lack of Transportation (Non-Medical): No  Physical Activity: Insufficiently Active (04/27/2022)   Exercise Vital Sign    Days of Exercise per Week: 3 days    Minutes of Exercise per Session: 30 min  Stress: No Stress Concern Present (04/27/2022)   [employer] of Occupational Health - Occupational Stress  Questionnaire    Feeling of Stress : Not at all  Social Connections: Moderately Integrated (04/27/2022)   Social Connection and Isolation Panel [NHANES]    Frequency of Communication with Friends and Family: More than three times a week    Frequency of Social Gatherings with Friends and Family: More than three times a week    Attends Religious Services: 1 to 4 times per year    Active Member of [employer] or Organizations: No    Attends Banker Meetings: Never    Marital Status: Married   Outpatient Encounter Medications as of 04/28/2023  Medication Sig   Accu-Chek Softclix Lancets lancets Use as  instructed   amLODipine (NORVASC) 5 MG tablet Take 1 tablet (5 mg total) by mouth daily.   atorvastatin (LIPITOR) 10 MG tablet Take 1 tablet (10 mg total) by mouth daily.   busPIRone (BUSPAR) 5 MG tablet Take 0.5-1 tablets (2.5-5 mg total) by mouth 2 (two) times daily as needed.   carvedilol (COREG) 12.5 MG tablet TAKE 1 TABLET TWICE DAILY WITH A MEAL   cholecalciferol (VITAMIN D3) 25 MCG (1000 UNIT) tablet Take 1 tablet (1,000 Units total) by mouth daily.   colchicine 0.6 MG tablet Take 0.6 mg by mouth daily as needed.   Febuxostat (ULORIC) 80 MG TABS 1 tablet Orally Once a day to manage gout for 30 day(s)   Febuxostat 80 MG TABS Take by mouth.   fluticasone (FLONASE) 50 MCG/ACT nasal spray Place 2 sprays into both nostrils daily. Use 2 sprays in each nostril daily   furosemide (LASIX) 40 MG tablet Take 1 tablet twice weekly   glucose blood (ACCU-CHEK AVIVA PLUS) test strip Use as instructed   Insulin Glargine (BASAGLAR KWIKPEN) 100 UNIT/ML Inject 10 Units into the skin daily with breakfast.   Lancets Misc. MISC Use as directed to check blood sugar up to 4x daily. E11.65   levocetirizine (XYZAL) 5 MG tablet Take 5 mg by mouth daily.   levothyroxine (SYNTHROID) 50 MCG tablet Take 1 tablet (50 mcg total) by mouth daily before breakfast.   losartan (COZAAR) 100 MG tablet 1 tablet Orally Once  a day   meclizine (ANTIVERT) 25 MG tablet Take 0.5-1 tablets (12.5-25 mg total) by mouth 3 (three) times daily as needed for dizziness.   metoprolol succinate (TOPROL-XL) 50 MG 24 hr tablet 1 tablet Orally Once a day   mupirocin ointment (BACTROBAN) 2 % Apply topically as directed.   pantoprazole (PROTONIX) 20 MG tablet Take 1 tablet (20 mg total) by mouth daily.   prednisoLONE acetate (PRED FORTE) 1 % ophthalmic suspension Place 1 drop into the right eye 4 (four) times daily.   silver sulfADIAZINE (SILVADENE) 1 % cream Apply topically as directed.   ULTICARE MICRO PEN NEEDLES 32G X 4 MM MISC TWICE DAILY   No facility-administered encounter medications on file as of 04/28/2023.   ALLERGIES: Allergies  Allergen Reactions   Aspirin Nausea And Vomiting   Ciprofloxacin Other (See Comments)    Upset Stomach   Codeine Nausea And Vomiting    Makes me sick    Hydrocodone-Acetaminophen     Other reaction(s): Unknown  09/14/22: Patient states that when she takes any type of pain medication she starts vomiting blood. States she was told not to take any OTC pain medication.   Losartan     Hyperkalemia    Micardis [Telmisartan] Other (See Comments)    unknown   Nexium [Esomeprazole Magnesium] Other (See Comments)    Causes internal bleeding   Niaspan [Niacin Er (Antihyperlipidemic)] Other (See Comments)    Reaction is unknown   Nsaids     Other reaction(s): Unknown   Onglyza [Saxagliptin] Other (See Comments)    Reaction is unknown   Other     No otc pain medications   Oxycodone Hcl     Other reaction(s): Unknown   Rofecoxib Other (See Comments)    unknown   Simvastatin    Tequin [Gatifloxacin] Other (See Comments)    Reaction is unknown   Welchol [Colesevelam Hcl]    Amoxicillin Nausea And Vomiting and Rash    Has patient had a PCN reaction causing immediate rash,  facial/tongue/throat swelling, SOB or lightheadedness with hypotension: Yes Has patient had a PCN reaction causing  severe rash involving mucus membranes or skin necrosis: no  Has patient had a PCN reaction that required hospitalization No Has patient had a PCN reaction occurring within the last 10 years: No If all of the above answers are "NO", then may proceed with Cephalosporin use.    VACCINATION STATUS: Immunization History  Administered Date(s) Administered   Fluad Quad(high Dose 65+) 02/05/2019, 03/05/2020, 03/10/2021, 03/15/2022   Fluad Trivalent(High Dose 65+) 03/03/2023   Influenza, High Dose Seasonal PF 03/05/2016, 03/14/2017, 03/01/2018   Influenza,inj,Quad PF,6+ Mos 02/16/2013, 02/27/2015   Moderna Covid-19 Vaccine Bivalent Booster 60yrs & up 03/10/2021   Moderna Sars-Covid-2 Vaccination 10/14/2020   PFIZER(Purple Top)SARS-COV-2 Vaccination 06/13/2019, 07/04/2019, 03/05/2020   Pfizer(Comirnaty)Fall Seasonal Vaccine 12 years and older 03/15/2022   Pneumococcal Conjugate-13 10/11/2014   Pneumococcal Polysaccharide-23 10/10/2012   Respiratory Syncytial Virus Vaccine,Recomb Aduvanted(Arexvy) 04/21/2022   Tdap 10/06/2012, 03/03/2023   Zoster Recombinant(Shingrix) 06/02/2021, 08/04/2021    Diabetes She presents for her follow-up diabetic visit. She has type 2 diabetes mellitus. Onset time: She was diagnosed at approximate age of 89 years . Her disease course has been stable. There are no hypoglycemic associated symptoms. Pertinent negatives for hypoglycemia include no confusion, headaches, pallor or seizures. Pertinent negatives for diabetes include no chest pain, no fatigue, no polydipsia, no polyphagia and no visual change. There are no hypoglycemic complications. Symptoms are stable. Diabetic complications include nephropathy, peripheral neuropathy, PVD and retinopathy. Risk factors for coronary artery disease include diabetes mellitus, dyslipidemia, hypertension, sedentary lifestyle and post-menopausal. Current diabetic treatment includes oral agent (monotherapy). She is compliant with  treatment most of the time. Her weight is fluctuating minimally. She is following a generally unhealthy diet. When asked about meal planning, she reported none. She has not had a previous visit with a dietitian. She never participates in exercise. Her home blood glucose trend is decreasing steadily. Her breakfast blood glucose range is generally 110-130 mg/dl. Her bedtime blood glucose range is generally 130-140 mg/dl. Her overall blood glucose range is 130-140 mg/dl. (She is utilizing her freestyle Libre device.  She presents with her CGM device showing 75% time in range, 23% level 1 hyperglycemia, 2% level 2 hyperglycemia.  Her point-of-care A1c 6.5% improving from 7.2%.  She did not document or report significant hypoglycemia.    ) An ACE inhibitor/angiotensin II receptor blocker is being taken. Eye exam is current.  Thyroid Problem Presents for follow-up visit. Onset time: 15 years. Patient reports no cold intolerance, diarrhea, fatigue, heat intolerance, palpitations or visual change. The symptoms have been stable. Past treatments include levothyroxine. The following procedures have not been performed: thyroidectomy.  Hypertension This is a chronic problem. The current episode started more than 1 year ago. Pertinent negatives include no chest pain, headaches, palpitations or shortness of breath. Past treatments include angiotensin blockers. Hypertensive end-organ damage includes PVD and retinopathy. Identifiable causes of hypertension include a thyroid problem.    Review of systems  Constitutional: + Minimally fluctuating body weight,  current  Body mass index is 27.85 kg/m. , no fatigue, no subjective hyperthermia, no subjective hypothermia    Objective:    BP (!) 130/58   Pulse 64   Ht 5\' 1"  (1.549 m)   Wt 147 lb 6.4 oz (66.9 kg)   BMI 27.85 kg/m   Wt Readings from Last 3 Encounters:  04/28/23 147 lb 6.4 oz (66.9 kg)  04/20/23 146 lb (66.2 kg)  03/03/23  146 lb (66.2 kg)      Physical Exam- Limited  Constitutional:  Body mass index is 27.85 kg/m. , not in acute distress, normal state of mind    Results for orders placed or performed in visit on 04/20/23  Bayer DCA Hb A1c Waived  Result Value Ref Range   HB A1C (BAYER DCA - WAIVED) 6.5 (H) 4.8 - 5.6 %  TSH  Result Value Ref Range   TSH 3.830 0.450 - 4.500 uIU/mL  T4, Free  Result Value Ref Range   Free T4 1.46 0.82 - 1.77 ng/dL  Lipid Panel  Result Value Ref Range   Cholesterol, Total 113 100 - 199 mg/dL   Triglycerides 97 0 - 149 mg/dL   HDL 39 (L) >18 mg/dL   VLDL Cholesterol Cal 19 5 - 40 mg/dL   LDL Chol Calc (NIH) 55 0 - 99 mg/dL   Chol/HDL Ratio 2.9 0.0 - 4.4 ratio  CMP14+EGFR  Result Value Ref Range   Glucose 117 (H) 70 - 99 mg/dL   BUN 42 (H) 8 - 27 mg/dL   Creatinine, Ser 8.41 (H) 0.57 - 1.00 mg/dL   eGFR 23 (L) >66 AY/TKZ/6.01   BUN/Creatinine Ratio 19 12 - 28   Sodium 141 134 - 144 mmol/L   Potassium 5.0 3.5 - 5.2 mmol/L   Chloride 105 96 - 106 mmol/L   CO2 20 20 - 29 mmol/L   Calcium 8.8 8.7 - 10.3 mg/dL   Total Protein 6.3 6.0 - 8.5 g/dL   Albumin 3.8 3.8 - 4.8 g/dL   Globulin, Total 2.5 1.5 - 4.5 g/dL   Bilirubin Total 0.4 0.0 - 1.2 mg/dL   Alkaline Phosphatase 102 44 - 121 IU/L   AST 16 0 - 40 IU/L   ALT 6 0 - 32 IU/L   Diabetic Labs (most recent): Lab Results  Component Value Date   HGBA1C 6.5 (H) 04/20/2023   HGBA1C 6.6 10/21/2022   HGBA1C 7.2 (H) 03/29/2022   MICROALBUR 100 02/17/2013   Lipid Panel     Component Value Date/Time   CHOL 113 04/20/2023 0913   CHOL 105 09/11/2012 1057   TRIG 97 04/20/2023 0913   TRIG 170 (H) 10/11/2014 1159   TRIG 79 09/11/2012 1057   HDL 39 (L) 04/20/2023 0913   HDL 53 10/11/2014 1159   HDL 41 09/11/2012 1057   CHOLHDL 2.9 04/20/2023 0913   LDLCALC 55 04/20/2023 0913   LDLCALC 50 02/14/2014 1012   LDLCALC 48 09/11/2012 1057     Assessment & Plan:   1. Type 2 diabetes mellitus with stage 4 chronic kidney disease,  without long-term current use of insulin (HCC)  - Patient has currently controlled asymptomatic type 2 DM since  77 years of age.  She is utilizing her freestyle Henderson device.  She presents with her CGM device showing 75% time in range, 23% level 1 hyperglycemia, 2% level 2 hyperglycemia.  Her point-of-care A1c 6.5% improving from 7.2%.  She did not document or report significant hypoglycemia.     -her  diabetes is complicated by PAD, CKD, neuropathy and patient remains at a high risk for more acute and chronic complications of diabetes which include CAD, CVA, CKD, retinopathy, and neuropathy. These are all discussed in detail with the patient.  - I have counseled the patient on diet management  by adopting a carbohydrate restricted/protein rich diet.   - she acknowledges that there is a room for improvement in her food and drink  choices. - Suggestion is made for her to avoid simple carbohydrates  from her diet including Cakes, Sweet Desserts, Ice Cream, Soda (diet and regular), Sweet Tea, Candies, Chips, Cookies, Store Bought Juices, Alcohol in Excess of  1-2 drinks a day, Artificial Sweeteners,  Coffee Creamer, and "Sugar-free" Products, Lemonade. This will help patient to have more stable blood glucose profile and potentially avoid unintended weight gain.  - I encouraged the patient to switch to  unprocessed or minimally processed complex starch and increased protein intake (animal or plant source), fruits, and vegetables.  - Patient is advised to stick to a routine mealtimes to eat 3 meals  a day and avoid unnecessary snacks ( to snack only to correct hypoglycemia).   - I have approached patient with the following individualized plan to manage diabetes and patient agrees:    -Her husband, Link Snuffer , continues to offer to help. -She is advised to continue Basaglar 10 units daily at bedtime,   advised to use her CGM to monitor blood glucose at all times.   - First priority in this patient will  be to avoid hypoglycemia.   -Patient is encouraged to call clinic for blood glucose levels less than 70 or above 200 mg /dl weekly average.  -Patient is not a candidate for MTF, Incretin therapy.  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    - Patient specific target  A1c;  LDL, HDL, Triglycerides,  were discussed in detail.  2) BP/HTN:  -Her blood pressure is controlled to target.  She is advised to continue her current blood pressure medications including amlodipine 5 mg p.o. daily, carvedilol 12.5 mg p.o. twice daily.  She  has documented allergy to ARB.  3) Lipids/HPL: Her recent lipid panel continues to show improvement in her LDL at 55.  she will continue to benefit from statin intervention currently on atorvastatin 10 mg p.o. daily at bedtime.   Side effects and precautions discussed with her.     4)  Weight/Diet: Her BMI is 26.56- She is not a candidate for major weight loss.     CDE Consult has been initiated, she has limited ability to exercise.  5)  Hypothyroidism:  - Her current thyroid function tests are consistent with appropriate replacement.  She is advised to continue levothyroxine 50 mcg p.o. daily before breakfast.     - We discussed about the correct intake of her thyroid hormone, on empty stomach at fasting, with water, separated by at least 30 minutes from breakfast and other medications,  and separated by more than 4 hours from calcium, iron, multivitamins, acid reflux medications (PPIs). -Patient is made aware of the fact that thyroid hormone replacement is needed for life, dose to be adjusted by periodic monitoring of thyroid function tests.    5) Chronic Care/Health Maintenance:  -Patient is  on ACEI/ARB and Statin medications and encouraged to continue to follow up with Ophthalmology, Podiatrist at least yearly or according to recommendations, and advised to   stay away from smoking. I have recommended yearly flu vaccine and pneumonia vaccination at least  every 5 years;  and  sleep for at least 7 hours a day.  - I advised patient to maintain close follow up with Raliegh Ip, DO for primary care needs.   I spent  26  minutes in the care of the patient today including review of labs from CMP, Lipids, Thyroid Function, Hematology (current and previous including abstractions from other facilities); face-to-face time discussing  her blood glucose readings/logs, discussing hypoglycemia and hyperglycemia episodes and symptoms, medications doses, her options of short and long term treatment based on the latest standards of care / guidelines;  discussion about incorporating lifestyle medicine;  and documenting the encounter. Risk reduction counseling performed per USPSTF guidelines to reduce  cardiovascular risk factors.     Please refer to Patient Instructions for Blood Glucose Monitoring and Insulin/Medications Dosing Guide"  in media tab for additional information. Please  also refer to " Patient Self Inventory" in the Media  tab for reviewed elements of pertinent patient history.  Phylliss Blakes participated in the discussions, expressed understanding, and voiced agreement with the above plans.  All questions were answered to her satisfaction. she is encouraged to contact clinic should she have any questions or concerns prior to her return visit.    Follow up plan: - Return in about 4 months (around 08/27/2023) for Bring Meter/CGM Device/Logs- A1c in Office.  Marquis Lunch, MD Phone: [phone]  Fax: [phone]  -  This note was partially dictated with voice recognition software. Similar sounding words can be transcribed inadequately or may not  be corrected upon review.  04/28/2023, 12:52 PM

## 2023-05-02 ENCOUNTER — Telehealth: Payer: Self-pay | Admitting: Family Medicine

## 2023-05-02 NOTE — Telephone Encounter (Signed)
Copied from CRM [id number]. Topic: General - Other >> Apr 29, 2023  4:19 PM Dennison Nancy wrote: Reason for CRM: patient calling back received a call from a nurse, patient spouse erased the message before patient could hear the message , patient request for a nurse to contact her. Let patient know that there was a voice message reminder her of her upcoming medicare awv on 04/29/23 .

## 2023-05-06 DIAGNOSIS — M1A09X1 Idiopathic chronic gout, multiple sites, with tophus (tophi): Secondary | ICD-10-CM | POA: Diagnosis not present

## 2023-05-06 DIAGNOSIS — D631 Anemia in chronic kidney disease: Secondary | ICD-10-CM | POA: Diagnosis not present

## 2023-05-06 DIAGNOSIS — I129 Hypertensive chronic kidney disease with stage 1 through stage 4 chronic kidney disease, or unspecified chronic kidney disease: Secondary | ICD-10-CM | POA: Diagnosis not present

## 2023-05-06 DIAGNOSIS — E1122 Type 2 diabetes mellitus with diabetic chronic kidney disease: Secondary | ICD-10-CM | POA: Diagnosis not present

## 2023-05-06 DIAGNOSIS — N183 Chronic kidney disease, stage 3 unspecified: Secondary | ICD-10-CM | POA: Diagnosis not present

## 2023-05-06 DIAGNOSIS — E11319 Type 2 diabetes mellitus with unspecified diabetic retinopathy without macular edema: Secondary | ICD-10-CM | POA: Diagnosis not present

## 2023-05-09 MED ORDER — BASAGLAR KWIKPEN 100 UNIT/ML ~~LOC~~ SOPN
10.0000 [IU] | PEN_INJECTOR | Freq: Every day | SUBCUTANEOUS | 5 refills | Status: DC
Start: 2023-05-09 — End: 2023-08-29

## 2023-05-09 NOTE — Telephone Encounter (Signed)
Rx sent. Did she get the application done already?

## 2023-05-10 ENCOUNTER — Other Ambulatory Visit: Payer: Self-pay | Admitting: Family Medicine

## 2023-05-12 NOTE — Progress Notes (Signed)
Pharmacy Medication Assistance Program Note    05/12/2023  Patient ID: [name], female   DOB: Jan 14, 1946, 77 y.o.   MRN: [id number]     04/01/2023 04/06/2023  Outreach Medication One  Initial Outreach Date (Medication One) 04/01/2023   Manufacturer Medication One [employer] [facility] Basaglar Basaglar  Dose of Basaglar  [facility]  Type of Forensic scientist Assistance  Date Application Sent to Patient  04/06/2023  Application Items Requested  Application  Date Application Sent to Prescriber  05/02/2023  Date Application Received From Patient  04/29/2023  Application Items Received From Patient  Application  Date Application Received From Provider  05/06/2023  Date Application Submitted to Manufacturer  05/09/2023  Method Application Sent to Manufacturer  Fax  Patient Assistance Determination  Approved  Approval Start Date  06/20/2023  Approval End Date  05/23/2024

## 2023-05-19 ENCOUNTER — Telehealth: Payer: Self-pay | Admitting: Family Medicine

## 2023-05-19 NOTE — Telephone Encounter (Signed)
FYI- regarding patient assistance program  Copied from CRM [id number]. Topic: General - Other >> May 19, 2023  8:20 AM [name] wrote: Reason for CRM: Pt states she filled out an application for insulin in October and she never heard back from [facility] nor [facility] and is requesting a call back to discuss [phone]

## 2023-05-19 NOTE — Telephone Encounter (Signed)
Informed patient of her approved enrollment for 2025.  Pt states she will call after the new year to get the company phone number, this way she can follow up on shipments when needed.

## 2023-05-27 ENCOUNTER — Ambulatory Visit (INDEPENDENT_AMBULATORY_CARE_PROVIDER_SITE_OTHER): Payer: Medicare Other

## 2023-05-27 VITALS — Ht 61.0 in | Wt 147.0 lb

## 2023-05-27 DIAGNOSIS — Z780 Asymptomatic menopausal state: Secondary | ICD-10-CM | POA: Diagnosis not present

## 2023-05-27 DIAGNOSIS — Z0000 Encounter for general adult medical examination without abnormal findings: Secondary | ICD-10-CM

## 2023-05-27 NOTE — Patient Instructions (Signed)
 Ms. Fletchall , Thank you for taking time to come for your Medicare Wellness Visit. I appreciate your ongoing commitment to your health goals. Please review the following plan we discussed and let me know if I can assist you in the future.   Referrals/Orders/Follow-Ups/Clinician Recommendations: Aim for 30 minutes of exercise or brisk walking, 6-8 glasses of water, and 5 servings of fruits and vegetables each day.  This is a list of the screening recommended for you and due dates:  Health Maintenance  Topic Date Due   DEXA scan (bone density measurement)  10/28/2019   Yearly kidney health urinalysis for diabetes  05/28/2021   COVID-19 Vaccine (7 - 2024-25 season) 01/23/2023   Colon Cancer Screening  04/19/2024*   Hemoglobin A1C  10/18/2023   Eye exam for diabetics  12/02/2023   Complete foot exam   01/18/2024   Yearly kidney function blood test for diabetes  04/19/2024   Medicare Annual Wellness Visit  05/26/2024   DTaP/Tdap/Td vaccine (3 - Td or Tdap) 03/02/2033   Pneumonia Vaccine  Completed   Flu Shot  Completed   Hepatitis C Screening  Completed   Zoster (Shingles) Vaccine  Completed   HPV Vaccine  Aged Out  *Topic was postponed. The date shown is not the original due date.    Advanced directives: (ACP Link)Information on Advanced Care Planning can be found at [place]  Secretary of [facility] Advance Health Care Directives Advance Health Care Directives ([email or web address]   Next Medicare Annual Wellness Visit scheduled for next year: Yes

## 2023-05-27 NOTE — Progress Notes (Signed)
 Subjective:   [name] is a 78 y.o. female who presents for Medicare Annual (Subsequent) preventive examination.  Visit Complete: Virtual I connected with  [name] on 05/27/23 by a audio enabled telemedicine application and verified that I am speaking with the correct person using two identifiers.  Patient Location: Home  Provider Location: Home Office  This patient declined Interactive audio and video telecommunications. Therefore the visit was completed with audio only.  I discussed the limitations of evaluation and management by telemedicine. The patient expressed understanding and agreed to proceed.  Vital Signs: Because this visit was a virtual/telehealth visit, some criteria may be missing or patient reported. Any vitals not documented were not able to be obtained and vitals that have been documented are patient reported.  Cardiac Risk Factors include: advanced age (>47men, >96 women);diabetes mellitus;dyslipidemia;hypertension     Objective:    Today's Vitals   05/27/23 1317  Weight: 147 lb (66.7 kg)  Height: 5' 1 (1.549 m)   Body mass index is 27.78 kg/m.     05/27/2023    1:47 PM 04/27/2022    2:07 PM 01/05/2022   12:13 PM 04/03/2021    3:34 PM 05/01/2019    4:00 PM 10/12/2017    9:25 AM 06/28/2017    3:04 PM  Advanced Directives  Does Patient Have a Medical Advance Directive? No No No No No No   Does patient want to make changes to medical advance directive?     No - Patient declined  No - Patient declined  Would patient like information on creating a medical advance directive? Yes (MAU/Ambulatory/Procedural Areas - Information given) No - Patient declined No - Patient declined No - Patient declined  No - Patient declined     Current Medications (verified) Outpatient Encounter Medications as of 05/27/2023  Medication Sig   Accu-Chek Softclix Lancets lancets Use as instructed   amLODipine  (NORVASC ) 5 MG tablet Take 1 tablet (5 mg total) by mouth  daily.   atorvastatin  (LIPITOR) 10 MG tablet Take 1 tablet (10 mg total) by mouth daily.   busPIRone  (BUSPAR ) 5 MG tablet Take 0.5-1 tablets (2.5-5 mg total) by mouth 2 (two) times daily as needed.   carvedilol  (COREG ) 12.5 MG tablet TAKE 1 TABLET TWICE DAILY WITH A MEAL   cholecalciferol  (VITAMIN D3) 25 MCG (1000 UNIT) tablet Take 1 tablet (1,000 Units total) by mouth daily.   colchicine  0.6 MG tablet Take 0.6 mg by mouth daily as needed.   Febuxostat  (ULORIC ) 80 MG TABS 1 tablet Orally Once a day to manage gout for 30 day(s)   Febuxostat  80 MG TABS Take by mouth.   fluticasone  (FLONASE ) 50 MCG/ACT nasal spray Place 2 sprays into both nostrils daily. Use 2 sprays in each nostril daily   furosemide  (LASIX ) 40 MG tablet Take 1 tablet twice weekly   glucose blood (ACCU-CHEK AVIVA PLUS) test strip Use as instructed   Insulin  Glargine (BASAGLAR  KWIKPEN) 100 UNIT/ML Inject 10 Units into the skin daily with breakfast.   Lancets Misc. MISC Use as directed to check blood sugar up to 4x daily. E11.65   levocetirizine (XYZAL ) 5 MG tablet Take 0.5 tablets (2.5 mg total) by mouth every 3 (three) days. PLEASE NOTE SIG.  PLEASE REVIEW WITH PT!  This is renally dosed for GFR <30   levothyroxine  (SYNTHROID ) 50 MCG tablet Take 1 tablet (50 mcg total) by mouth daily before breakfast.   losartan  (COZAAR ) 100 MG tablet 1 tablet Orally Once a  day   meclizine  (ANTIVERT ) 25 MG tablet Take 0.5-1 tablets (12.5-25 mg total) by mouth 3 (three) times daily as needed for dizziness.   metoprolol  succinate (TOPROL -XL) 50 MG 24 hr tablet 1 tablet Orally Once a day   mupirocin ointment (BACTROBAN) 2 % Apply topically as directed.   pantoprazole  (PROTONIX ) 20 MG tablet Take 1 tablet (20 mg total) by mouth daily.   prednisoLONE  acetate (PRED FORTE ) 1 % ophthalmic suspension Place 1 drop into the right eye 4 (four) times daily.   silver  sulfADIAZINE  (SILVADENE ) 1 % cream Apply topically as directed.   ULTICARE MICRO PEN NEEDLES  32G X 4 MM MISC TWICE DAILY   No facility-administered encounter medications on file as of 05/27/2023.    Allergies (verified) Aspirin, Ciprofloxacin, Codeine, Hydrocodone -acetaminophen , Losartan , Micardis [telmisartan], Nexium [esomeprazole magnesium ], Niaspan [niacin er (antihyperlipidemic)], Nsaids, Onglyza [saxagliptin ], Other, Oxycodone  hcl, Rofecoxib, Simvastatin, Tequin [gatifloxacin], Welchol [colesevelam hcl], and Amoxicillin   History: Past Medical History:  Diagnosis Date   Anal fistula    Anemia in chronic renal disease    Aranesp  injection --  when Hg <11, last injection 12-24-14.   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    CKD (chronic kidney disease), stage III [facility])    nephrologist--  dr froylan-- LOV  07-09-2016   Complication of anesthesia    post-op confusion    Constipation    Coronary artery disease    Diabetic gastroparesis (HCC)    Diabetic retinopathy (HCC)    bilateral --  monitored by dr elner   Diverticulosis of colon    GAD (generalized anxiety disorder)    GERD (gastroesophageal reflux disease)    Hip fracture requiring operative repair [facility])    History of colon polyps    benign   History of esophagitis    History of GI bleed    upper 2009  due to esophagitis  &  2002  due to Mallory-Weiss tear   History of hyperkalemia    pt had previously been canceled twice dos due to elevated K+, 07-29-2016 last date cancelled -- pt visited pcp same day treated w/ kayexelate and K+ came down, pt brought all her medication's in to pcp office and found pt was taking losartan  that had been discontinued due to ckd, pcp stated this was cause of elevated  K+   History of Mallory-Weiss syndrome    12/ 2002--  resolved   History of rectal abscess    12-29-2004  bedside I & D   Hyperlipidemia    Hypertension    Hypothyroidism    LAFB (left anterior fascicular block)    Right bundle branch block    Sacral decubitus ulcer    since 2014- 01-02-15 remains with wound gauze dressing changes daily.   Type II diabetes mellitus (HCC)    Past Surgical History:  Procedure Laterality Date   APPLICATION OF A-CELL OF EXTREMITY N/A 04/07/2015   Procedure: A CELL PLACMENT;  Surgeon: Estefana RAMAN Dillingham, DO;  Location: [facility] OR;  Service: Plastics;  Laterality: N/A;   CARDIOVASCULAR STRESS TEST  12/30/2004   normal perfusion study/  no ischemia or infartion/  normal LV wall  function and wall motion , ef 66%   CATARACT EXTRACTION W/ INTRAOCULAR LENS  IMPLANT, BILATERAL  1995   COLONOSCOPY  2008   w/Dr.Brodie    COLONOSCOPY W/ POLYPECTOMY  last one 2008   COMPRESSION HIP SCREW Right 05/18/2014   Procedure: COMPRESSION HIP;  Surgeon: Taft FORBES Minerva, MD;  Location: [facility];  Service: Orthopedics;  Laterality: Right;   ESOPHAGOGASTRODUODENOSCOPY  last one 01-09-2011   EVALUATION UNDER ANESTHESIA WITH FISTULECTOMY N/A 01/06/2015   Procedure: EXAM UNDER ANESTHESIA , placement of seton;  Surgeon: Krystal Russell, MD;  Location: [facility];  Service: General;  Laterality: N/A;   I & D EXTREMITY N/A 04/07/2015   Procedure: IRRIGATION AND DEBRIDEMENT ISCHIAL ULCER;  Surgeon: Estefana RAMAN Dillingham, DO;  Location: [facility];  Service: Plastics;  Laterality: N/A;   INCISION AND DRAINAGE OF WOUND N/A 09/30/2014   Procedure: IRRIGATION AND DEBRIDEMENT SACRAL WOUND, EXCISION OF PERIRECTAL TRACT WITH PLACEMENT OF ACCELL;  Surgeon: Estefana Reichert, DO;  Location: [facility];  Service: Plastics;  Laterality: N/A;   LIGATION OF INTERNAL FISTULA TRACT N/A 10/22/2016   Procedure: LIGATION OF INTERNAL FISTULA TRACT;  Surgeon: Debby Hila, MD;  Location: [facility];  Service: General;  Laterality: N/A;   ORIF FEMUR FRACTURE Left 10/09/2012   Procedure: OPEN REDUCTION INTERNAL FIXATION (ORIF) DISTAL FEMUR FRACTURE;  Surgeon: Ozell VEAR Bruch, MD;  Location: [facility];  Service: Orthopedics;  Laterality: Left;   RETINOPATHY SURGERY Bilateral 1980's?   SKIN CANCER EXCISION  03/2023   TRANSTHORACIC ECHOCARDIOGRAM  02/18/2011   mild LVH,  ef 55-60%,  grade I diastolic dysfunction/  mild TR/  RV systolic pressure increased consistant with moderate pulmonary hypertension   Family History  Problem Relation Age of Onset   Coronary artery disease Mother 20   Heart disease Mother    Diabetes Mother    Colon cancer Father 80   Prostate cancer Father    Diabetes Father    Coronary artery disease Sister 18   Diabetes Sister    Heart disease Sister    Diabetes Sister    Diabetes Sister    Diabetes Sister    Diabetes Sister    Diabetes Sister    Diabetes Maternal Grandmother    Esophageal cancer Neg Hx    Stomach cancer Neg Hx    Rectal cancer Neg Hx    Social History   Socioeconomic History   Marital status: Married    Spouse name: [name]   Number of children: 0   Years of education: 12   Highest education level: Not on file  Occupational History   Occupation: Retired    Associate Professor: RETIRED  Tobacco Use   Smoking status: Never   Smokeless tobacco: Never  Vaping Use   Vaping status: Never Used  Substance and Sexual Activity   Alcohol use: No   Drug use: No   Sexual activity: Not Currently  Other Topics Concern   Not on file  Social History Narrative   Lives with husband.    Lost her mother in 1999   Caffeine use: none      01/19/22 No kids, support each other. Spouse diagnosed with cancer recently. She is legally blind but cares for spouse and vice versa. As of 2023 married for 55 years per pt   Social Drivers of Health   Financial Resource Strain: Low Risk  (05/27/2023)   Overall Financial Resource Strain (CARDIA)    Difficulty  of Paying Living Expenses:  Not hard at all  Food Insecurity: Food Insecurity Present (05/27/2023)   Hunger Vital Sign    Worried About Running Out of Food in the Last Year: Never true    Ran Out of Food in the Last Year: Sometimes true  Transportation Needs: No Transportation Needs (05/27/2023)   PRAPARE - Administrator, Civil Service (Medical): No    Lack of Transportation (Non-Medical): No  Physical Activity: Insufficiently Active (05/27/2023)   Exercise Vital Sign    Days of Exercise per Week: 3 days    Minutes of Exercise per Session: 30 min  Stress: No Stress Concern Present (05/27/2023)   [employer] of Occupational Health - Occupational Stress Questionnaire    Feeling of Stress : Not at all  Social Connections: Moderately Integrated (05/27/2023)   Social Connection and Isolation Panel [NHANES]    Frequency of Communication with Friends and Family: More than three times a week    Frequency of Social Gatherings with Friends and Family: Three times a week    Attends Religious Services: 1 to 4 times per year    Active Member of Clubs or Organizations: No    Attends Banker Meetings: Never    Marital Status: Married    Tobacco Counseling Counseling given: Not Answered   Clinical Intake:  Pre-visit preparation completed: Yes  Pain : No/denies pain     Diabetes: Yes CBG done?: No Did pt. bring in CBG monitor from home?: No  How often do you need to have someone help you when you read instructions, pamphlets, or other written materials from your doctor or pharmacy?: 1 - Never  Interpreter Needed?: No  Information entered by :: Charmaine Bloodgood LPN   Activities of Daily Living    05/27/2023    1:46 PM  In your present state of health, do you have any difficulty performing the following activities:  Hearing? 0  Vision? 1  Difficulty concentrating or making decisions? 0  Walking or climbing stairs? 0  Dressing or bathing? 0  Doing errands,  shopping? 0  Preparing Food and eating ? N  Using the Toilet? N  In the past six months, have you accidently leaked urine? N  Do you have problems with loss of bowel control? N  Managing your Medications? N  Managing your Finances? N  Housekeeping or managing your Housekeeping? N    Patient Care Team: Jolinda Norene HERO, DO as PCP - General (Family Medicine) Elner Arley LABOR, MD as Consulting Physician (Ophthalmology) Froylan Sharper, MD as Consulting Physician (Nephrology) Lavona Agent, MD as Consulting Physician (Cardiology) Obie Princella HERO, MD (Inactive) as Consulting Physician (Gastroenterology) Frances Sharper RAMAN, LCSW as [facility] Care Management (Licensed Clinical Social Worker) Billee, Mliss BIRCH, [facility] (Pharmacist) Ramonita Suzen CROME, RN as [facility] Care Management Nida, Ethelle ORN, MD as Consulting Physician (Endocrinology) Prescilla Beams, MD as Consulting Physician (Nephrology) Roddie Bring, DPM as Consulting Physician (Podiatry) Eda Baxter, MD as Referring Physician (Dermatology)  Indicate any recent Medical Services you may have received from other than [facility] providers in the past year (date may be approximate).     Assessment:   This is a routine wellness examination for [place].  Hearing/Vision screen Hearing Screening - Comments:: Denies hearing difficulties   Vision Screening - Comments:: Wears rx glasses - up to date with routine eye exams with Dr. Elner     Goals Addressed             This Visit's  Progress    Remain active and independent        Depression Screen    05/27/2023    1:22 PM 04/20/2023    8:57 AM 04/27/2022    2:06 PM 03/29/2022    8:04 AM 02/19/2022   11:48 AM 01/19/2022   12:01 PM 01/05/2022   11:52 AM  PHQ 2/9 Scores  PHQ - 2 Score 0 0 0 0 0 0 0  PHQ- 9 Score 0 0         Fall Risk    05/27/2023    1:46 PM 04/20/2023    8:57 AM 03/03/2023    2:32 PM 11/10/2022   11:00 AM 04/27/2022     2:04 PM  Fall Risk   Falls in the past year? 0 0 0 0 0  Number falls in past yr: 0 0 0 0 0  Injury with Fall? 0 0 0 0 0  Risk for fall due to : No Fall Risks No Fall Risks No Fall Risks No Fall Risks No Fall Risks  Follow up Falls prevention discussed;Education provided;Falls evaluation completed Education provided Education provided Education provided Falls prevention discussed    MEDICARE RISK AT HOME: Medicare Risk at Home Any stairs in or around the home?: No If so, are there any without handrails?: No Home free of loose throw rugs in walkways, pet beds, electrical cords, etc?: Yes Adequate lighting in your home to reduce risk of falls?: Yes Life alert?: No Use of a cane, walker or w/c?: No Grab bars in the bathroom?: Yes Shower chair or bench in shower?: No Elevated toilet seat or a handicapped toilet?: Yes  TIMED UP AND GO:  Was the test performed?  No    Cognitive Function:        05/27/2023    1:47 PM 04/03/2021    3:38 PM  6CIT Screen  What Year? 0 points 0 points  What month? 0 points 0 points  What time? 0 points 0 points  Count back from 20 0 points 0 points  Months in reverse 0 points 2 points  Repeat phrase 0 points 2 points  Total Score 0 points 4 points    Immunizations Immunization History  Administered Date(s) Administered   Fluad Quad(high Dose 65+) 02/05/2019, 03/05/2020, 03/10/2021, 03/15/2022   Fluad Trivalent(High Dose 65+) 03/03/2023   Influenza, High Dose Seasonal PF 03/05/2016, 03/14/2017, 03/01/2018   Influenza,inj,Quad PF,6+ Mos 02/16/2013, 02/27/2015   Moderna Covid-19 Vaccine  Bivalent Booster 91yrs & up 03/10/2021   Moderna Sars-Covid-2 Vaccination 10/14/2020   PFIZER(Purple Top)SARS-COV-2 Vaccination 06/13/2019, 07/04/2019, 03/05/2020   Pfizer(Comirnaty)Fall Seasonal Vaccine 12 years and older 03/15/2022   Pneumococcal Conjugate-13 10/11/2014   Pneumococcal Polysaccharide-23 10/10/2012   Respiratory Syncytial Virus Vaccine,Recomb  Aduvanted(Arexvy) 04/21/2022   Tdap 10/06/2012, 03/03/2023   Zoster Recombinant(Shingrix) 06/02/2021, 08/04/2021    TDAP status: Up to date  Flu Vaccine status: Up to date  Pneumococcal vaccine status: Up to date  Covid-19 vaccine status: Information provided on how to obtain vaccines.   Qualifies for Shingles Vaccine? Yes   Zostavax completed No   Shingrix Completed?: Yes  Screening Tests Health Maintenance  Topic Date Due   DEXA SCAN  10/28/2019   Diabetic kidney evaluation - Urine ACR  05/28/2021   COVID-19 Vaccine (7 - 2024-25 season) 01/23/2023   Colonoscopy  04/19/2024 (Originally 11/15/2021)   HEMOGLOBIN A1C  10/18/2023   OPHTHALMOLOGY EXAM  12/02/2023   FOOT EXAM  01/18/2024   Diabetic kidney evaluation - eGFR measurement  04/19/2024   Medicare Annual Wellness (AWV)  05/26/2024   DTaP/Tdap/Td (3 - Td or Tdap) 03/02/2033   Pneumonia Vaccine 33+ Years old  Completed   INFLUENZA VACCINE  Completed   Hepatitis C Screening  Completed   Zoster Vaccines- Shingrix  Completed   HPV VACCINES  Aged Out    Health Maintenance  Health Maintenance Due  Topic Date Due   DEXA SCAN  10/28/2019   Diabetic kidney evaluation - Urine ACR  05/28/2021   COVID-19 Vaccine (7 - 2024-25 season) 01/23/2023    Colorectal cancer screening: No longer required.   Mammogram status: No longer required due to age and preference.  Bone Density status: Ordered today. Pt provided with contact info and advised to call to schedule appt.  Lung Cancer Screening: (Low Dose CT Chest recommended if Age 82-80 years, 20 pack-year currently smoking OR have quit w/in 15years.) does not qualify.   Lung Cancer Screening Referral: n/a  Additional Screening:  Hepatitis C Screening: does qualify; Completed 06/03/15  Vision Screening: Recommended annual ophthalmology exams for early detection of glaucoma and other disorders of the eye. Is the patient up to date with their annual eye exam?  Yes  Who is  the provider or what is the name of the office in which the patient attends annual eye exams? Dr. Elner If pt is not established with a provider, would they like to be referred to a provider to establish care? No .   Dental Screening: Recommended annual dental exams for proper oral hygiene  Diabetic Foot Exam: Diabetic Foot Exam: Completed 01/18/23  Community Resource Referral / Chronic Care Management: CRR required this visit?  No   CCM required this visit?  No     Plan:     I have personally reviewed and noted the following in the patient's chart:   Medical and social history Use of alcohol, tobacco or illicit drugs  Current medications and supplements including opioid prescriptions. Patient is not currently taking opioid prescriptions. Functional ability and status Nutritional status Physical activity Advanced directives List of other physicians Hospitalizations, surgeries, and ER visits in previous 12 months Vitals Screenings to include cognitive, depression, and falls Referrals and appointments  In addition, I have reviewed and discussed with patient certain preventive protocols, quality metrics, and best practice recommendations. A written personalized care plan for preventive services as well as general preventive health recommendations were provided to patient.     Lavelle Pfeiffer [place], [place]   12/27/7972   After Visit Summary: (Mail) Due to this being a telephonic visit, the after visit summary with patients personalized plan was offered to patient via mail   Nurse Notes: No concerns at this time

## 2023-06-01 ENCOUNTER — Telehealth: Payer: Self-pay | Admitting: "Endocrinology

## 2023-06-01 ENCOUNTER — Encounter (HOSPITAL_COMMUNITY): Payer: Self-pay | Admitting: *Deleted

## 2023-06-01 DIAGNOSIS — E034 Atrophy of thyroid (acquired): Secondary | ICD-10-CM

## 2023-06-01 MED ORDER — LEVOTHYROXINE SODIUM 50 MCG PO TABS: 50.0000 ug | ORAL_TABLET | Freq: Every day | ORAL | 1 refills | Status: DC

## 2023-06-01 NOTE — Telephone Encounter (Signed)
 Pt needs refill of levothyroxine sent to [facility].

## 2023-06-01 NOTE — Telephone Encounter (Signed)
 Refill sent.

## 2023-06-15 ENCOUNTER — Encounter: Payer: Self-pay | Admitting: Family Medicine

## 2023-06-27 DIAGNOSIS — H353134 Nonexudative age-related macular degeneration, bilateral, advanced atrophic with subfoveal involvement: Secondary | ICD-10-CM | POA: Diagnosis not present

## 2023-06-27 DIAGNOSIS — H35351 Cystoid macular degeneration, right eye: Secondary | ICD-10-CM | POA: Diagnosis not present

## 2023-06-27 DIAGNOSIS — T8529XD Other mechanical complication of intraocular lens, subsequent encounter: Secondary | ICD-10-CM | POA: Diagnosis not present

## 2023-06-27 DIAGNOSIS — E113553 Type 2 diabetes mellitus with stable proliferative diabetic retinopathy, bilateral: Secondary | ICD-10-CM | POA: Diagnosis not present

## 2023-07-05 DIAGNOSIS — E1142 Type 2 diabetes mellitus with diabetic polyneuropathy: Secondary | ICD-10-CM | POA: Diagnosis not present

## 2023-07-05 DIAGNOSIS — B351 Tinea unguium: Secondary | ICD-10-CM | POA: Diagnosis not present

## 2023-07-05 DIAGNOSIS — M79676 Pain in unspecified toe(s): Secondary | ICD-10-CM | POA: Diagnosis not present

## 2023-07-05 DIAGNOSIS — L84 Corns and callosities: Secondary | ICD-10-CM | POA: Diagnosis not present

## 2023-07-11 ENCOUNTER — Encounter (HOSPITAL_COMMUNITY): Payer: Self-pay | Admitting: *Deleted

## 2023-07-18 ENCOUNTER — Ambulatory Visit (INDEPENDENT_AMBULATORY_CARE_PROVIDER_SITE_OTHER): Payer: Medicare Other

## 2023-07-18 ENCOUNTER — Encounter: Payer: Self-pay | Admitting: Family Medicine

## 2023-07-18 ENCOUNTER — Ambulatory Visit (INDEPENDENT_AMBULATORY_CARE_PROVIDER_SITE_OTHER): Payer: Medicare Other | Admitting: Family Medicine

## 2023-07-18 VITALS — BP 118/60 | HR 50 | Temp 98.1°F | Ht 61.0 in | Wt 147.0 lb

## 2023-07-18 DIAGNOSIS — Z794 Long term (current) use of insulin: Secondary | ICD-10-CM

## 2023-07-18 DIAGNOSIS — E1122 Type 2 diabetes mellitus with diabetic chronic kidney disease: Secondary | ICD-10-CM | POA: Diagnosis not present

## 2023-07-18 DIAGNOSIS — K219 Gastro-esophageal reflux disease without esophagitis: Secondary | ICD-10-CM

## 2023-07-18 DIAGNOSIS — I152 Hypertension secondary to endocrine disorders: Secondary | ICD-10-CM

## 2023-07-18 DIAGNOSIS — E1159 Type 2 diabetes mellitus with other circulatory complications: Secondary | ICD-10-CM | POA: Diagnosis not present

## 2023-07-18 DIAGNOSIS — Z780 Asymptomatic menopausal state: Secondary | ICD-10-CM | POA: Diagnosis not present

## 2023-07-18 DIAGNOSIS — E785 Hyperlipidemia, unspecified: Secondary | ICD-10-CM

## 2023-07-18 DIAGNOSIS — E034 Atrophy of thyroid (acquired): Secondary | ICD-10-CM

## 2023-07-18 DIAGNOSIS — E1169 Type 2 diabetes mellitus with other specified complication: Secondary | ICD-10-CM | POA: Diagnosis not present

## 2023-07-18 DIAGNOSIS — N1831 Chronic kidney disease, stage 3a: Secondary | ICD-10-CM

## 2023-07-18 DIAGNOSIS — H8112 Benign paroxysmal vertigo, left ear: Secondary | ICD-10-CM

## 2023-07-18 LAB — BAYER DCA HB A1C WAIVED: HB A1C (BAYER DCA - WAIVED): 6.9 % — ABNORMAL HIGH (ref 4.8–5.6)

## 2023-07-18 MED ORDER — ATORVASTATIN CALCIUM 10 MG PO TABS: 10.0000 mg | ORAL_TABLET | Freq: Every day | ORAL | Status: DC

## 2023-07-18 MED ORDER — FUROSEMIDE 40 MG PO TABS: ORAL_TABLET | ORAL | 3 refills | Status: DC

## 2023-07-18 MED ORDER — AMLODIPINE BESYLATE 5 MG PO TABS: 5.0000 mg | ORAL_TABLET | Freq: Every day | ORAL | 3 refills | Status: DC

## 2023-07-18 MED ORDER — PANTOPRAZOLE SODIUM 20 MG PO TBEC: 20.0000 mg | DELAYED_RELEASE_TABLET | Freq: Every day | ORAL | 3 refills | Status: DC

## 2023-07-18 MED ORDER — LEVOCETIRIZINE DIHYDROCHLORIDE 5 MG PO TABS: 2.5000 mg | ORAL_TABLET | ORAL | 3 refills | Status: DC

## 2023-07-18 MED ORDER — MECLIZINE HCL 25 MG PO TABS
12.5000 mg | ORAL_TABLET | Freq: Three times a day (TID) | ORAL | 0 refills | Status: DC | PRN
Start: 2023-07-18 — End: 2024-02-22

## 2023-07-18 NOTE — Progress Notes (Signed)
 Subjective: [name] PCP: Raliegh Ip, DO [id number] H Fronek is a 78 y.o. female presenting to clinic today for:  1. Hypothyroidism Compliant with levothyroxine daily.  She reports no tremor, heart palpitations or changes in bowel habits.  2.  Type 2 diabetes associate with hypertension, hyperlipidemia She is currently managed by Dr. Fransico Him.  However, she does want a go ahead and get her lab obtained today so she knows where her sugar is.  She has been prescribed insulin and compliant with all medications as prescribed by her endocrinologist.  Reports no hypoglycemic episodes, visual disturbance etc.  Compliant with blood pressure and cholesterol medications.  No chest pain, shortness of breath or edema reported   ROS: Per HPI  Allergies  Allergen Reactions   Aspirin Nausea And Vomiting   Ciprofloxacin Other (See Comments)    Upset Stomach   Codeine Nausea And Vomiting    Makes me sick    Hydrocodone-Acetaminophen     Other reaction(s): Unknown  09/14/22: Patient states that when she takes any type of pain medication she starts vomiting blood. States she was told not to take any OTC pain medication.   Losartan     Hyperkalemia    Micardis [Telmisartan] Other (See Comments)    unknown   Nexium [Esomeprazole Magnesium] Other (See Comments)    Causes internal bleeding   Niaspan [Niacin Er (Antihyperlipidemic)] Other (See Comments)    Reaction is unknown   Nsaids     Other reaction(s): Unknown   Onglyza [Saxagliptin] Other (See Comments)    Reaction is unknown   Other     No otc pain medications   Oxycodone Hcl     Other reaction(s): Unknown   Rofecoxib Other (See Comments)    unknown   Simvastatin    Tequin [Gatifloxacin] Other (See Comments)    Reaction is unknown   Welchol [Colesevelam Hcl]    Amoxicillin Nausea And Vomiting and Rash    Has patient had a PCN reaction causing immediate rash, facial/tongue/throat swelling, SOB or lightheadedness with  hypotension: Yes Has patient had a PCN reaction causing severe rash involving mucus membranes or skin necrosis: no  Has patient had a PCN reaction that required hospitalization No Has patient had a PCN reaction occurring within the last 10 years: No If all of the above answers are "NO", then may proceed with Cephalosporin use.    Past Medical History:  Diagnosis Date   Anal fistula    Anemia in chronic renal disease    Aranesp injection --  when Hg <11, last injection 12-24-14.   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    CKD (chronic kidney disease), stage III [facility])    nephrologist--  dr Briant Cedar-- LOV  07-09-2016   Complication of anesthesia    post-op confusion    Constipation    Coronary artery disease    Diabetic gastroparesis (HCC)    Diabetic retinopathy (HCC)    bilateral --  monitored by dr Luciana Axe   Diverticulosis of colon    GAD (generalized anxiety disorder)    GERD (gastroesophageal reflux disease)    Hip fracture requiring operative repair (HCC)  History of colon polyps    benign   History of esophagitis    History of GI bleed    upper 2009  due to esophagitis  &  2002  due to Mallory-Weiss tear   History of hyperkalemia    pt had previously been canceled twice dos due to elevated K+, 07-29-2016 last date cancelled -- pt visited pcp same day treated w/ kayexelate and K+ came down, pt brought all her medication's in to pcp office and found pt was taking losartan that had been discontinued due to ckd, pcp stated this was cause of elevated K+   History of Mallory-Weiss syndrome    12/ 2002--  resolved   History of rectal abscess    12-29-2004  bedside  I & D   Hyperlipidemia    Hypertension    Hypothyroidism    LAFB (left anterior fascicular block)    Right bundle branch block    Sacral decubitus ulcer    since 2014- 01-02-15 remains with wound" gauze dressing changes daily.   Type II diabetes mellitus (HCC)     Current Outpatient Medications:    BOOSTRIX 5-2.5-18.5 LF-MCG/0.5 injection, , Disp: , Rfl:    Accu-Chek Softclix Lancets lancets, Use as instructed, Disp: 100 each, Rfl: 12   amLODipine (NORVASC) 5 MG tablet, Take 1 tablet (5 mg total) by mouth daily., Disp: 90 tablet, Rfl: 3   atorvastatin (LIPITOR) 10 MG tablet, Take 1 tablet (10 mg total) by mouth daily., Disp: 90 tablet, Rfl: 03   busPIRone (BUSPAR) 5 MG tablet, Take 0.5-1 tablets (2.5-5 mg total) by mouth 2 (two) times daily as needed., Disp: 20 tablet, Rfl: 2   carvedilol (COREG) 12.5 MG tablet, TAKE 1 TABLET TWICE DAILY WITH A MEAL, Disp: 180 tablet, Rfl: 3   cholecalciferol (VITAMIN D3) 25 MCG (1000 UNIT) tablet, Take 1 tablet (1,000 Units total) by mouth daily., Disp: 90 tablet, Rfl: 3   colchicine 0.6 MG tablet, Take 0.6 mg by mouth daily as needed., Disp: , Rfl:    Febuxostat (ULORIC) 80 MG TABS, 1 tablet Orally Once a day to manage gout for 30 day(s), Disp: , Rfl:    Febuxostat 80 MG TABS, Take by mouth., Disp: , Rfl:    fluticasone (FLONASE) 50 MCG/ACT nasal spray, Place 2 sprays into both nostrils daily. Use 2 sprays in each nostril daily, Disp: 16 g, Rfl: 3   furosemide (LASIX) 40 MG tablet, Take 1 tablet twice weekly, Disp: 26 tablet, Rfl: 3   glucose blood (ACCU-CHEK AVIVA PLUS) test strip, Use as instructed, Disp: 100 each, Rfl: 12   Insulin Glargine (BASAGLAR KWIKPEN) 100 UNIT/ML, Inject 10 Units into the skin daily with breakfast., Disp: 45 mL, Rfl: 5   Lancets Misc. MISC, Use as directed to check blood sugar up to 4x daily. E11.65, Disp: 300 each, Rfl: 3   levocetirizine (XYZAL) 5 MG tablet, Take 0.5 tablets (2.5 mg total) by mouth every 3 (three) days.  PLEASE NOTE SIG.  PLEASE REVIEW WITH PT!  This is renally dosed for GFR <30, Disp: 30 tablet, Rfl: 3   levothyroxine (SYNTHROID) 50 MCG tablet, Take 1 tablet (50 mcg total) by mouth daily before breakfast., Disp: 90 tablet, Rfl: 1   losartan (COZAAR) 100 MG tablet, 1 tablet Orally Once a day, Disp: , Rfl:    meclizine (ANTIVERT) 25 MG tablet, Take 0.5-1 tablets (12.5-25 mg total) by mouth 3 (three) times daily as needed for dizziness., Disp: 30 tablet, Rfl: 0  mupirocin ointment (BACTROBAN) 2 %, Apply topically as directed., Disp: , Rfl:    pantoprazole (PROTONIX) 20 MG tablet, Take 1 tablet (20 mg total) by mouth daily., Disp: 90 tablet, Rfl: 3   prednisoLONE acetate (PRED FORTE) 1 % ophthalmic suspension, Place 1 drop into the right eye 4 (four) times daily., Disp: 5 mL, Rfl: 0   silver sulfADIAZINE (SILVADENE) 1 % cream, Apply topically as directed., Disp: , Rfl:    ULTICARE MICRO PEN NEEDLES 32G X 4 MM MISC, TWICE DAILY, Disp: 100 each, Rfl: 5 Social History   Socioeconomic History   Marital status: Married    Spouse name: [name]   Number of children: 0   Years of education: 12   Highest education level: Not on file  Occupational History   Occupation: Retired    Associate Professor: RETIRED  Tobacco Use   Smoking status: Never   Smokeless tobacco: Never  Vaping Use   Vaping status: Never Used  Substance and Sexual Activity   Alcohol use: No   Drug use: No   Sexual activity: Not Currently  Other Topics Concern   Not on file  Social History Narrative   Lives with husband.    Lost her mother in 1999   Caffeine use: none      01/19/22 No kids, support each other. Spouse diagnosed with cancer recently. She is legally blind but cares for spouse and vice versa. As of 2023 married for 55 years per pt   Social Drivers of Corporate investment banker Strain: Low Risk  (05/27/2023)   Overall Financial Resource Strain (CARDIA)    Difficulty of Paying Living Expenses: Not hard at all  Food  Insecurity: Food Insecurity Present (05/27/2023)   Hunger Vital Sign    Worried About Running Out of Food in the Last Year: Never true    Ran Out of Food in the Last Year: Sometimes true  Transportation Needs: No Transportation Needs (05/27/2023)   PRAPARE - Administrator, Civil Service (Medical): No    Lack of Transportation (Non-Medical): No  Physical Activity: Insufficiently Active (05/27/2023)   Exercise Vital Sign    Days of Exercise per Week: 3 days    Minutes of Exercise per Session: 30 min  Stress: No Stress Concern Present (05/27/2023)   [employer] of Occupational Health - Occupational Stress Questionnaire    Feeling of Stress : Not at all  Social Connections: Moderately Integrated (05/27/2023)   Social Connection and Isolation Panel [NHANES]    Frequency of Communication with Friends and Family: More than three times a week    Frequency of Social Gatherings with Friends and Family: Three times a week    Attends Religious Services: 1 to 4 times per year    Active Member of Clubs or Organizations: No    Attends Banker Meetings: Never    Marital Status: Married  Catering manager Violence: Not At Risk (05/27/2023)   Humiliation, Afraid, Rape, and Kick questionnaire    Fear of Current or Ex-Partner: No    Emotionally Abused: No    Physically Abused: No    Sexually Abused: No   Family History  Problem Relation Age of Onset   Coronary artery disease Mother 72   Heart disease Mother    Diabetes Mother    Colon cancer Father 65   Prostate cancer Father    Diabetes Father    Coronary artery disease Sister 72   Diabetes Sister    Heart  disease Sister    Diabetes Sister    Diabetes Sister    Diabetes Sister    Diabetes Sister    Diabetes Sister    Diabetes Maternal Grandmother    Esophageal cancer Neg Hx    Stomach cancer Neg Hx    Rectal cancer Neg Hx     Objective: Office vital signs reviewed. BP 118/60   Pulse (!) 50   Temp 98.1 F (36.7  C)   Ht 5\' 1"  (1.549 m)   Wt 147 lb (66.7 kg)   SpO2 98%   BMI 27.78 kg/m   Physical Examination:  General: Awake, alert, nontoxic elderly female, No acute distress HEENT: Sclera white.  Moist mucous membranes Cardio: regular rate and rhythm, S1S2 heard, no murmurs appreciated Pulm: clear to auscultation bilaterally, no wheezes, rhonchi or rales; normal work of breathing on room air MSK: Arrives in wheelchair Neuro: No tremor  Assessment/ Plan: 78 y.o. female   Hypothyroidism due to acquired atrophy of thyroid - Plan: TSH + free T4  Hypertension associated with diabetes (HCC) - Plan: amLODipine (NORVASC) 5 MG tablet, furosemide (LASIX) 40 MG tablet  Hyperlipidemia associated with type 2 diabetes mellitus (HCC) - Plan: atorvastatin (LIPITOR) 10 MG tablet  Type 2 diabetes mellitus with stage 3a chronic kidney disease, with long-term current use of insulin (HCC) - Plan: Bayer DCA Hb A1c Waived  BPPV (benign paroxysmal positional vertigo), left - Plan: meclizine (ANTIVERT) 25 MG tablet  Gastroesophageal reflux disease without esophagitis - Plan: pantoprazole (PROTONIX) 20 MG tablet  Asymptomatic from a thyroid standpoint.  Check thyroid levels  Blood pressure well-controlled.  Medications renewed  Continue statin  A1c collected today.  Continue to follow-up with endocrinology as directed.  I was able to find her urine microalbumin in the central database and I have put that in for scanning  Did not discuss BPPV or GERD.  Refills needed and these were sent to [facility] as requested   Raliegh Ip, DO [facility] [phone]

## 2023-07-19 LAB — TSH+FREE T4
Free T4: 1.22 ng/dL (ref 0.82–1.77)
TSH: 3.47 u[IU]/mL (ref 0.450–4.500)

## 2023-07-20 DIAGNOSIS — K080 Exfoliation of teeth due to systemic causes: Secondary | ICD-10-CM | POA: Diagnosis not present

## 2023-07-21 ENCOUNTER — Other Ambulatory Visit: Payer: Self-pay | Admitting: "Endocrinology

## 2023-07-21 DIAGNOSIS — Z780 Asymptomatic menopausal state: Secondary | ICD-10-CM | POA: Diagnosis not present

## 2023-07-21 DIAGNOSIS — M810 Age-related osteoporosis without current pathological fracture: Secondary | ICD-10-CM | POA: Diagnosis not present

## 2023-07-21 MED ORDER — FREESTYLE LIBRE 3 SENSOR MISC: 1.0000 | 2 refills | Status: DC

## 2023-07-21 MED ORDER — DEXCOM G7 RECEIVER DEVI
0 refills | Status: DC
Start: 2023-07-21 — End: 2023-07-22

## 2023-07-22 ENCOUNTER — Other Ambulatory Visit: Payer: Self-pay

## 2023-07-22 DIAGNOSIS — Z794 Long term (current) use of insulin: Secondary | ICD-10-CM

## 2023-07-22 MED ORDER — FREESTYLE LIBRE 2 SENSOR MISC: 2 refills | Status: DC

## 2023-07-25 ENCOUNTER — Telehealth: Payer: Self-pay | Admitting: "Endocrinology

## 2023-07-25 DIAGNOSIS — Z794 Long term (current) use of insulin: Secondary | ICD-10-CM

## 2023-07-25 MED ORDER — FREESTYLE LIBRE 2 READER DEVI: 0 refills | Status: DC

## 2023-07-25 NOTE — Telephone Encounter (Signed)
 Rx for Twodot 2 reader sent to [facility].

## 2023-07-25 NOTE — Telephone Encounter (Signed)
 Pt is needing a new libre 2 reader, she states its showing her sugar is high and then when she pricks her finger its showing lower.

## 2023-08-26 NOTE — Patient Outreach (Signed)
 Error in opening encounter   Erice Ahles L. Noelle Penner, RN, BSN, CCM [facility] RN Care Manager [phone]

## 2023-08-26 NOTE — Patient Outreach (Signed)
 Care Coordination   Follow Up Visit Note   08/26/2023 late entry for 03/01/23 Name: [name] MRN: [id number] DOB: 1945-08-31  [name] is a 78 y.o. year old female who sees Raliegh Ip, DO for primary care. I spoke with  Phylliss Blakes by phone today.  What matters to the patients health and wellness today?  Home resources and follow up with SW    Goals Addressed             This Visit's Progress    Resource to assist with home care -care coordination services   On track    Interventions Today    Flowsheet Row Most Recent Value  Chronic Disease   Chronic disease during today's visit Other  [follow up with Social worker outreach]  General Interventions   General Interventions Discussed/Reviewed General Interventions Reviewed, Doctor Visits, Holiday representative Visits Discussed/Reviewed Doctor Visits Reviewed, PCP, Specialist  PCP/Specialist Visits Compliance with follow-up visit  Exercise Interventions   Exercise Discussed/Reviewed Exercise Reviewed, Physical Activity, Assistive device use and maintanence  Physical Activity Discussed/Reviewed Physical Activity Reviewed, Home Exercise Program (HEP)  Education Interventions   Education Provided Provided Education  Provided Verbal Education On Eye Care, [facility], When to see the doctor  Mental Health Interventions   Mental Health Discussed/Reviewed Mental Health Reviewed, Coping Strategies  Pharmacy Interventions   Pharmacy Dicussed/Reviewed Pharmacy Topics Reviewed, Affording Medications  Safety Interventions   Safety Discussed/Reviewed Safety Reviewed, Fall Risk, Home Safety  Home Safety Assistive Devices  Advanced Directive Interventions   Advanced Directives Discussed/Reviewed Advanced Directives Discussed  [does no prefer at this time]              SDOH assessments and interventions completed:  No     Care Coordination Interventions:  Yes, provided   Follow up plan: No  further intervention required.   Encounter Outcome:  Patient Visit Completed   Cala Bradford L. Noelle Penner, RN, BSN, CCM [facility] RN Care Manager [phone]

## 2023-08-29 ENCOUNTER — Ambulatory Visit: Payer: Medicare Other | Admitting: "Endocrinology

## 2023-08-29 ENCOUNTER — Encounter: Payer: Self-pay | Admitting: "Endocrinology

## 2023-08-29 VITALS — BP 130/56 | HR 60 | Ht 61.0 in | Wt 144.0 lb

## 2023-08-29 DIAGNOSIS — I10 Essential (primary) hypertension: Secondary | ICD-10-CM

## 2023-08-29 DIAGNOSIS — E782 Mixed hyperlipidemia: Secondary | ICD-10-CM

## 2023-08-29 DIAGNOSIS — E034 Atrophy of thyroid (acquired): Secondary | ICD-10-CM

## 2023-08-29 DIAGNOSIS — E1122 Type 2 diabetes mellitus with diabetic chronic kidney disease: Secondary | ICD-10-CM | POA: Diagnosis not present

## 2023-08-29 DIAGNOSIS — N1832 Chronic kidney disease, stage 3b: Secondary | ICD-10-CM | POA: Diagnosis not present

## 2023-08-29 DIAGNOSIS — N1831 Chronic kidney disease, stage 3a: Secondary | ICD-10-CM

## 2023-08-29 DIAGNOSIS — Z794 Long term (current) use of insulin: Secondary | ICD-10-CM

## 2023-08-29 MED ORDER — FREESTYLE LIBRE 2 SENSOR MISC: 2 refills | Status: DC

## 2023-08-29 MED ORDER — BASAGLAR KWIKPEN 100 UNIT/ML ~~LOC~~ SOPN: 6.0000 [IU] | PEN_INJECTOR | Freq: Every day | SUBCUTANEOUS | 0 refills | Status: DC

## 2023-08-29 NOTE — Progress Notes (Signed)
 08/29/2023   Endocrinology follow-up note   Subjective:    Patient ID: [name], female    DOB: [birth year].  She is being seen in follow-up in the management of uncontrolled type 2 diabetes, hypothyroidism, hypertension, hyperlipidemia.  Past Medical History:  Diagnosis Date   Anal fistula    Anemia in chronic renal disease    Aranesp injection --  when Hg <11, last injection 12-24-14.   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    CKD (chronic kidney disease), stage III [facility])    nephrologist--  dr Briant Cedar-- LOV  07-09-2016   Complication of anesthesia    post-op confusion    Constipation    Coronary artery disease    Diabetic gastroparesis (HCC)    Diabetic retinopathy (HCC)    bilateral --  monitored by dr Luciana Axe   Diverticulosis of colon    GAD (generalized anxiety disorder)    GERD (gastroesophageal reflux disease)    Hip fracture requiring operative repair [facility])    History of colon polyps    benign   History of esophagitis    History of GI bleed    upper 2009  due to esophagitis  &  2002  due to Mallory-Weiss tear   History of hyperkalemia    pt had previously been canceled twice dos due to elevated K+, 07-29-2016 last date cancelled -- pt visited pcp same day treated w/ kayexelate and K+ came down, pt brought all her medication's in to pcp office and found pt was taking losartan that had been discontinued due to ckd, pcp stated this was cause of elevated K+   History of Mallory-Weiss syndrome    12/ 2002--  resolved   History of rectal abscess    12-29-2004  bedside I & D   Hyperlipidemia    Hypertension    Hypothyroidism    LAFB (left anterior  fascicular block)    Right bundle branch block    Sacral decubitus ulcer    since 2014- 01-02-15 remains with wound" gauze dressing changes daily.   Type II diabetes mellitus (HCC)    Past Surgical History:  Procedure Laterality Date   APPLICATION OF A-CELL OF EXTREMITY N/A 04/07/2015   Procedure: A CELL PLACMENT;  Surgeon: Alena Bills Dillingham, DO;  Location: [facility] OR;  Service: Plastics;  Laterality: N/A;   CARDIOVASCULAR STRESS TEST  12/30/2004   normal perfusion study/  no ischemia or infartion/  normal LV wall function and wall motion , ef 66%   CATARACT EXTRACTION W/ INTRAOCULAR LENS  IMPLANT, BILATERAL  1995   COLONOSCOPY  2008   w/Dr.Brodie    COLONOSCOPY W/ POLYPECTOMY  last one 2008   COMPRESSION HIP SCREW Right 05/18/2014   Procedure: COMPRESSION HIP;  Surgeon: Vickki Hearing, MD;  Location: [facility];  Service: Orthopedics;  Laterality: Right;   ESOPHAGOGASTRODUODENOSCOPY  last one 01-09-2011   EVALUATION UNDER ANESTHESIA WITH FISTULECTOMY N/A 01/06/2015   Procedure: EXAM UNDER ANESTHESIA , placement of seton;  Surgeon: Avel Peace, MD;  Location: [facility] ORS;  Service: General;  Laterality: N/A;   I & D EXTREMITY N/A 04/07/2015   Procedure: IRRIGATION AND  DEBRIDEMENT ISCHIAL ULCER;  Surgeon: Alena Bills Dillingham, DO;  Location: [facility] OR;  Service: Plastics;  Laterality: N/A;   INCISION AND DRAINAGE OF WOUND N/A 09/30/2014   Procedure: IRRIGATION AND DEBRIDEMENT SACRAL WOUND, EXCISION OF PERIRECTAL TRACT WITH PLACEMENT OF ACCELL;  Surgeon: Wayland Denis, DO;  Location: [facility];  Service: Plastics;  Laterality: N/A;   LIGATION OF INTERNAL FISTULA TRACT N/A 10/22/2016   Procedure: LIGATION OF INTERNAL FISTULA TRACT;  Surgeon: Romie Levee, MD;  Location: [facility];  Service: General;  Laterality: N/A;   ORIF FEMUR FRACTURE Left 10/09/2012   Procedure: OPEN REDUCTION INTERNAL FIXATION (ORIF) DISTAL FEMUR FRACTURE;  Surgeon: Budd Palmer, MD;   Location: [facility] OR;  Service: Orthopedics;  Laterality: Left;   RETINOPATHY SURGERY Bilateral 1980's?   SKIN CANCER EXCISION  03/2023   TRANSTHORACIC ECHOCARDIOGRAM  02/18/2011   mild LVH,  ef 55-60%,  grade I diastolic dysfunction/  mild TR/  RV systolic pressure increased consistant with moderate pulmonary hypertension   Social History   Socioeconomic History   Marital status: Married    Spouse name: [name]   Number of children: 0   Years of education: 12   Highest education level: Not on file  Occupational History   Occupation: Retired    Associate Professor: RETIRED  Tobacco Use   Smoking status: Never   Smokeless tobacco: Never  Vaping Use   Vaping status: Never Used  Substance and Sexual Activity   Alcohol use: No   Drug use: No   Sexual activity: Not Currently  Other Topics Concern   Not on file  Social History Narrative   Lives with husband.    Lost her mother in 1999   Caffeine use: none      01/19/22 No kids, support each other. Spouse diagnosed with cancer recently. She is legally blind but cares for spouse and vice versa. As of 2023 married for 55 years per pt   Social Drivers of Corporate investment banker Strain: Low Risk  (05/27/2023)   Overall Financial Resource Strain (CARDIA)    Difficulty of Paying Living Expenses: Not hard at all  Food Insecurity: Food Insecurity Present (05/27/2023)   Hunger Vital Sign    Worried About Running Out of Food in the Last Year: Never true    Ran Out of Food in the Last Year: Sometimes true  Transportation Needs: No Transportation Needs (05/27/2023)   PRAPARE - Administrator, Civil Service (Medical): No    Lack of Transportation (Non-Medical): No  Physical Activity: Insufficiently Active (05/27/2023)   Exercise Vital Sign    Days of Exercise per Week: 3 days    Minutes of Exercise per Session: 30 min  Stress: No Stress Concern Present (05/27/2023)   [employer] of Occupational Health - Occupational Stress Questionnaire     Feeling of Stress : Not at all  Social Connections: Moderately Integrated (05/27/2023)   Social Connection and Isolation Panel [NHANES]    Frequency of Communication with Friends and Family: More than three times a week    Frequency of Social Gatherings with Friends and Family: Three times a week    Attends Religious Services: 1 to 4 times per year    Active Member of Clubs or Organizations: No    Attends Banker Meetings: Never    Marital Status: Married   Outpatient Encounter Medications as of 08/29/2023  Medication Sig   Accu-Chek Softclix Lancets lancets Use as instructed  amLODipine (NORVASC) 5 MG tablet Take 1 tablet (5 mg total) by mouth daily.   atorvastatin (LIPITOR) 10 MG tablet Take 1 tablet (10 mg total) by mouth daily.   BOOSTRIX 5-2.5-18.5 LF-MCG/0.5 injection    busPIRone (BUSPAR) 5 MG tablet Take 0.5-1 tablets (2.5-5 mg total) by mouth 2 (two) times daily as needed.   carvedilol (COREG) 12.5 MG tablet TAKE 1 TABLET TWICE DAILY WITH A MEAL   cholecalciferol (VITAMIN D3) 25 MCG (1000 UNIT) tablet Take 1 tablet (1,000 Units total) by mouth daily.   colchicine 0.6 MG tablet Take 0.6 mg by mouth daily as needed.   Continuous Glucose Receiver (FREESTYLE LIBRE 2 READER) DEVI Use to monitor glucose continuously as directed.   Continuous Glucose Sensor (FREESTYLE LIBRE 2 SENSOR) MISC Use to monitor glucose continuously as directed.   Febuxostat (ULORIC) 80 MG TABS 1 tablet Orally Once a day to manage gout for 30 day(s)   Febuxostat 80 MG TABS Take by mouth.   fluticasone (FLONASE) 50 MCG/ACT nasal spray Place 2 sprays into both nostrils daily. Use 2 sprays in each nostril daily   furosemide (LASIX) 40 MG tablet Take 1 tablet twice weekly   glucose blood (ACCU-CHEK AVIVA PLUS) test strip Use as instructed   Insulin Glargine (BASAGLAR KWIKPEN) 100 UNIT/ML Inject 6 Units into the skin daily with breakfast.   Lancets Misc. MISC Use as directed to check blood sugar up to 4x  daily. E11.65   levocetirizine (XYZAL) 5 MG tablet Take 0.5 tablets (2.5 mg total) by mouth every 3 (three) days. PLEASE NOTE SIG.  PLEASE REVIEW WITH PT!  This is renally dosed for GFR <30   levothyroxine (SYNTHROID) 50 MCG tablet Take 1 tablet (50 mcg total) by mouth daily before breakfast.   losartan (COZAAR) 100 MG tablet 1 tablet Orally Once a day   meclizine (ANTIVERT) 25 MG tablet Take 0.5-1 tablets (12.5-25 mg total) by mouth 3 (three) times daily as needed for dizziness.   mupirocin ointment (BACTROBAN) 2 % Apply topically as directed.   pantoprazole (PROTONIX) 20 MG tablet Take 1 tablet (20 mg total) by mouth daily.   prednisoLONE acetate (PRED FORTE) 1 % ophthalmic suspension Place 1 drop into the right eye 4 (four) times daily.   silver sulfADIAZINE (SILVADENE) 1 % cream Apply topically as directed.   ULTICARE MICRO PEN NEEDLES 32G X 4 MM MISC TWICE DAILY   [DISCONTINUED] Continuous Glucose Sensor (FREESTYLE LIBRE 2 SENSOR) MISC Use to monitor glucose continuously as directed.   [DISCONTINUED] Insulin Glargine (BASAGLAR KWIKPEN) 100 UNIT/ML Inject 10 Units into the skin daily with breakfast. (Patient taking differently: Inject 6 Units into the skin daily with breakfast.)   No facility-administered encounter medications on file as of 08/29/2023.   ALLERGIES: Allergies  Allergen Reactions   Aspirin Nausea And Vomiting   Ciprofloxacin Other (See Comments)    Upset Stomach   Codeine Nausea And Vomiting    Makes me sick    Hydrocodone-Acetaminophen     Other reaction(s): Unknown  09/14/22: Patient states that when she takes any type of pain medication she starts vomiting blood. States she was told not to take any OTC pain medication.   Losartan     Hyperkalemia    Micardis [Telmisartan] Other (See Comments)    unknown   Nexium [Esomeprazole Magnesium] Other (See Comments)    Causes internal bleeding   Niaspan [Niacin Er (Antihyperlipidemic)] Other (See Comments)    Reaction is  unknown   Nsaids  Other reaction(s): Unknown   Onglyza [Saxagliptin] Other (See Comments)    Reaction is unknown   Other     No otc pain medications   Oxycodone Hcl     Other reaction(s): Unknown   Rofecoxib Other (See Comments)    unknown   Simvastatin    Tequin [Gatifloxacin] Other (See Comments)    Reaction is unknown   Welchol [Colesevelam Hcl]    Amoxicillin Nausea And Vomiting and Rash    Has patient had a PCN reaction causing immediate rash, facial/tongue/throat swelling, SOB or lightheadedness with hypotension: Yes Has patient had a PCN reaction causing severe rash involving mucus membranes or skin necrosis: no  Has patient had a PCN reaction that required hospitalization No Has patient had a PCN reaction occurring within the last 10 years: No If all of the above answers are "NO", then may proceed with Cephalosporin use.    VACCINATION STATUS: Immunization History  Administered Date(s) Administered   Fluad Quad(high Dose 65+) 02/05/2019, 03/05/2020, 03/10/2021, 03/15/2022   Fluad Trivalent(High Dose 65+) 03/03/2023   Influenza, High Dose Seasonal PF 03/05/2016, 03/14/2017, 03/01/2018   Influenza,inj,Quad PF,6+ Mos 02/16/2013, 02/27/2015   Moderna Covid-19 Vaccine Bivalent Booster 59yrs & up 03/10/2021   Moderna Sars-Covid-2 Vaccination 10/14/2020   PFIZER(Purple Top)SARS-COV-2 Vaccination 06/13/2019, 07/04/2019, 03/05/2020   Pfizer(Comirnaty)Fall Seasonal Vaccine 12 years and older 03/15/2022   Pneumococcal Conjugate-13 10/11/2014   Pneumococcal Polysaccharide-23 10/10/2012   Respiratory Syncytial Virus Vaccine,Recomb Aduvanted(Arexvy) 04/21/2022   Tdap 10/06/2012, 03/03/2023   Zoster Recombinant(Shingrix) 06/02/2021, 08/04/2021    Diabetes She presents for her follow-up diabetic visit. She has type 2 diabetes mellitus. Onset time: She was diagnosed at approximate age of 89 years . Her disease course has been fluctuating. There are no hypoglycemic associated  symptoms. Pertinent negatives for hypoglycemia include no confusion, headaches, pallor or seizures. Pertinent negatives for diabetes include no chest pain, no fatigue, no polydipsia, no polyphagia and no visual change. There are no hypoglycemic complications. Symptoms are stable. Diabetic complications include nephropathy, peripheral neuropathy, PVD and retinopathy. Risk factors for coronary artery disease include diabetes mellitus, dyslipidemia, hypertension, sedentary lifestyle and post-menopausal. Current diabetic treatment includes oral agent (monotherapy). She is compliant with treatment most of the time. Her weight is fluctuating minimally. She is following a generally unhealthy diet. When asked about meal planning, she reported none. She has not had a previous visit with a dietitian. She never participates in exercise. Her home blood glucose trend is fluctuating minimally. Her breakfast blood glucose range is generally 110-130 mg/dl. Her lunch blood glucose range is generally 130-140 mg/dl. Her bedtime blood glucose range is generally 130-140 mg/dl. Her overall blood glucose range is 130-140 mg/dl. (She is utilizing her freestyle Libre device.  She presents with her CGM device showing controlled glycemic profile with 79% in range, 19% level 1 hyperglycemia, 2% level 2 hyperglycemia.  Her average blood glucose is 148 mg per DL, previsit  Z6X of 0.9% staying stable.    ) An ACE inhibitor/angiotensin II receptor blocker is being taken. Eye exam is current.  Thyroid Problem Presents for follow-up visit. Patient reports no cold intolerance, diarrhea, fatigue, heat intolerance, palpitations or visual change. The symptoms have been stable.  Hypertension This is a chronic problem. The current episode started more than 1 year ago. Pertinent negatives include no chest pain, headaches, palpitations or shortness of breath. Past treatments include angiotensin blockers. Hypertensive end-organ damage includes PVD and  retinopathy. Identifiable causes of hypertension include a thyroid problem.  Review of systems  Constitutional: + Minimally fluctuating body weight,  current  Body mass index is 27.21 kg/m. , no fatigue, no subjective hyperthermia, no subjective hypothermia    Objective:    BP (!) 130/56   Pulse 60   Ht 5\' 1"  (1.549 m)   Wt 144 lb (65.3 kg)   BMI 27.21 kg/m   Wt Readings from Last 3 Encounters:  08/29/23 144 lb (65.3 kg)  07/18/23 147 lb (66.7 kg)  05/27/23 147 lb (66.7 kg)     Physical Exam- Limited  Constitutional:  Body mass index is 27.21 kg/m. , not in acute distress, normal state of mind    Results for orders placed or performed in visit on 07/18/23  TSH + free T4   Collection Time: 07/18/23  8:44 AM  Result Value Ref Range   TSH 3.470 0.450 - 4.500 uIU/mL   Free T4 1.22 0.82 - 1.77 ng/dL  Bayer DCA Hb Z6X Waived   Collection Time: 07/18/23  9:13 AM  Result Value Ref Range   HB A1C (BAYER DCA - WAIVED) 6.9 (H) 4.8 - 5.6 %   *Note: Due to a large number of results and/or encounters for the requested time period, some results have not been displayed. A complete set of results can be found in Results Review.   Diabetic Labs (most recent): Lab Results  Component Value Date   HGBA1C 6.9 (H) 07/18/2023   HGBA1C 6.5 (H) 04/20/2023   HGBA1C 6.6 10/21/2022   MICROALBUR 100 02/17/2013   Lipid Panel     Component Value Date/Time   CHOL 113 04/20/2023 0913   CHOL 105 09/11/2012 1057   TRIG 97 04/20/2023 0913   TRIG 170 (H) 10/11/2014 1159   TRIG 79 09/11/2012 1057   HDL 39 (L) 04/20/2023 0913   HDL 53 10/11/2014 1159   HDL 41 09/11/2012 1057   CHOLHDL 2.9 04/20/2023 0913   LDLCALC 55 04/20/2023 0913   LDLCALC 50 02/14/2014 1012   LDLCALC 48 09/11/2012 1057     Assessment & Plan:   1. Type 2 diabetes mellitus with stage 4 chronic kidney disease, without long-term current use of insulin (HCC)  - Patient has currently controlled asymptomatic type 2  DM since  78 years of age.  She is utilizing her freestyle [place] device.  She presents with her CGM device showing controlled glycemic profile with 79% in range, 19% level 1 hyperglycemia, 2% level 2 hyperglycemia.  Her average blood glucose is 148 mg per DL, previsit  W9U of 0.4% staying stable.     -her  diabetes is complicated by PAD, CKD, neuropathy and patient remains at a high risk for more acute and chronic complications of diabetes which include CAD, CVA, CKD, retinopathy, and neuropathy. These are all discussed in detail with the patient.  - I have counseled the patient on diet management  by adopting a carbohydrate restricted/protein rich diet.   - she acknowledges that there is a room for improvement in her food and drink choices. - Suggestion is made for her to avoid simple carbohydrates  from her diet including Cakes, Sweet Desserts, Ice Cream, Soda (diet and regular), Sweet Tea, Candies, Chips, Cookies, Store Bought Juices, Alcohol in Excess of  1-2 drinks a day, Artificial Sweeteners,  Coffee Creamer, and "Sugar-free" Products, Lemonade. This will help patient to have more stable blood glucose profile and potentially avoid unintended weight gain.  - I encouraged the patient to switch to  unprocessed or minimally processed  complex starch and increased protein intake (animal or plant source), fruits, and vegetables.  - Patient is advised to stick to a routine mealtimes to eat 3 meals  a day and avoid unnecessary snacks ( to snack only to correct hypoglycemia).   - I have approached patient with the following individualized plan to manage diabetes and patient agrees:    -Her husband, Link Snuffer , continues to offer to help. -She is advised to Basaglar 60 units daily at breakfast,   advised to use her CGM to monitor blood glucose at all times.   - First priority in this patient will be to avoid hypoglycemia.   -Patient is encouraged to call clinic for blood glucose levels less than 70 or  above 200 mg /dl weekly average.  -Patient is not a candidate for MTF, Incretin therapy.  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    - Patient specific target  A1c;  LDL, HDL, Triglycerides,  were discussed in detail.  2) BP/HTN:  -Her blood pressure is controlled to target.  She is advised to continue her current blood pressure medications including amlodipine 5 mg p.o. daily, carvedilol 12.5 mg p.o. twice daily.  She  has documented allergy to ARB.  3) Lipids/HPL: Her recent lipid panel continues to show improvement in her LDL at 55.  she will continue to benefit from statin intervention currently on atorvastatin 10 mg p.o. daily at bedtime.  She is advised to continue.  Side effects and precautions discussed with her.     4)  Weight/Diet: Her BMI is 27.21-- She is not a candidate for major weight loss.     CDE Consult has been initiated, she has limited ability to exercise.  5)  Hypothyroidism:  - Her current thyroid function tests are consistent with appropriate replacement.  She is advised to continue levothyroxine 50 mcg p.o. daily before breakfast.     - We discussed about the correct intake of her thyroid hormone, on empty stomach at fasting, with water, separated by at least 30 minutes from breakfast and other medications,  and separated by more than 4 hours from calcium, iron, multivitamins, acid reflux medications (PPIs). -Patient is made aware of the fact that thyroid hormone replacement is needed for life, dose to be adjusted by periodic monitoring of thyroid function tests.   5) Chronic Care/Health Maintenance:  -Patient is  on ACEI/ARB and Statin medications and encouraged to continue to follow up with Ophthalmology, Podiatrist at least yearly or according to recommendations, and advised to   stay away from smoking. I have recommended yearly flu vaccine and pneumonia vaccination at least every 5 years;  and  sleep for at least 7 hours a day.  - I advised patient to  maintain close follow up with Raliegh Ip, DO for primary care needs.    I spent  26  minutes in the care of the patient today including review of labs from CMP, Lipids, Thyroid Function, Hematology (current and previous including abstractions from other facilities); face-to-face time discussing  her blood glucose readings/logs, discussing hypoglycemia and hyperglycemia episodes and symptoms, medications doses, her options of short and long term treatment based on the latest standards of care / guidelines;  discussion about incorporating lifestyle medicine;  and documenting the encounter. Risk reduction counseling performed per USPSTF guidelines to reduce cardiovascular risk factors.     Please refer to Patient Instructions for Blood Glucose Monitoring and Insulin/Medications Dosing Guide"  in media tab for additional information. Please  also refer to " Patient Self Inventory" in the Media  tab for reviewed elements of pertinent patient history.  Phylliss Blakes participated in the discussions, expressed understanding, and voiced agreement with the above plans.  All questions were answered to her satisfaction. she is encouraged to contact clinic should she have any questions or concerns prior to her return visit.   Follow up plan: - Return in about 6 months (around 02/28/2024) for F/U with Pre-visit Labs, Meter/CGM/Logs, A1c here.  Marquis Lunch, MD Phone: [phone]  Fax: [phone]  -  This note was partially dictated with voice recognition software. Similar sounding words can be transcribed inadequately or may not  be corrected upon review.  08/29/2023, 3:53 PM

## 2023-08-30 DIAGNOSIS — N184 Chronic kidney disease, stage 4 (severe): Secondary | ICD-10-CM | POA: Diagnosis not present

## 2023-08-30 DIAGNOSIS — I129 Hypertensive chronic kidney disease with stage 1 through stage 4 chronic kidney disease, or unspecified chronic kidney disease: Secondary | ICD-10-CM | POA: Diagnosis not present

## 2023-08-30 DIAGNOSIS — M109 Gout, unspecified: Secondary | ICD-10-CM | POA: Diagnosis not present

## 2023-08-30 DIAGNOSIS — D631 Anemia in chronic kidney disease: Secondary | ICD-10-CM | POA: Diagnosis not present

## 2023-08-30 DIAGNOSIS — N2581 Secondary hyperparathyroidism of renal origin: Secondary | ICD-10-CM | POA: Diagnosis not present

## 2023-08-30 DIAGNOSIS — E1122 Type 2 diabetes mellitus with diabetic chronic kidney disease: Secondary | ICD-10-CM | POA: Diagnosis not present

## 2023-08-30 LAB — PROTEIN / CREATININE RATIO, URINE
Albumin, U: 42.7
Creatinine, Urine: 80.7

## 2023-08-30 LAB — MICROALBUMIN / CREATININE URINE RATIO: Microalb Creat Ratio: 529

## 2023-08-31 LAB — LAB REPORT - SCANNED
Creatinine, POC: 80.7 mg/dL
EGFR: 27

## 2023-09-13 DIAGNOSIS — M79675 Pain in left toe(s): Secondary | ICD-10-CM | POA: Diagnosis not present

## 2023-09-13 DIAGNOSIS — M79674 Pain in right toe(s): Secondary | ICD-10-CM | POA: Diagnosis not present

## 2023-09-13 DIAGNOSIS — B351 Tinea unguium: Secondary | ICD-10-CM | POA: Diagnosis not present

## 2023-09-13 DIAGNOSIS — E1142 Type 2 diabetes mellitus with diabetic polyneuropathy: Secondary | ICD-10-CM | POA: Diagnosis not present

## 2023-09-13 DIAGNOSIS — L84 Corns and callosities: Secondary | ICD-10-CM | POA: Diagnosis not present

## 2023-10-21 ENCOUNTER — Telehealth: Payer: Self-pay

## 2023-10-21 NOTE — Telephone Encounter (Signed)
 I spoke to pt and she was requesting a zpak for sinus infection and pt advised she would ntbs in office. Pt scheduled 10/24/23 with PCP at 10:15.

## 2023-10-21 NOTE — Telephone Encounter (Signed)
 Copied from CRM [id number]. Topic: Clinical - Medication Question >> Oct 21, 2023  8:54 AM Lotus Round B wrote: Reason for CRM: pt called in to see if she can get a Cpap prescription because she has a real bad cold . Pt wants to know If this can get done early so she can go pick it up before 1pm. She would also like a call from a nurse as well if possible .

## 2023-10-24 ENCOUNTER — Ambulatory Visit (INDEPENDENT_AMBULATORY_CARE_PROVIDER_SITE_OTHER)

## 2023-10-24 ENCOUNTER — Encounter: Payer: Self-pay | Admitting: Family Medicine

## 2023-10-24 ENCOUNTER — Ambulatory Visit: Payer: Self-pay | Admitting: Family Medicine

## 2023-10-24 ENCOUNTER — Ambulatory Visit: Admitting: Family Medicine

## 2023-10-24 VITALS — BP 125/67 | HR 60 | Temp 97.3°F | Ht 61.0 in | Wt 146.0 lb

## 2023-10-24 DIAGNOSIS — R062 Wheezing: Secondary | ICD-10-CM | POA: Diagnosis not present

## 2023-10-24 DIAGNOSIS — R051 Acute cough: Secondary | ICD-10-CM | POA: Diagnosis not present

## 2023-10-24 DIAGNOSIS — I700 Atherosclerosis of aorta: Secondary | ICD-10-CM | POA: Diagnosis not present

## 2023-10-24 DIAGNOSIS — R9389 Abnormal findings on diagnostic imaging of other specified body structures: Secondary | ICD-10-CM | POA: Diagnosis not present

## 2023-10-24 DIAGNOSIS — J329 Chronic sinusitis, unspecified: Secondary | ICD-10-CM

## 2023-10-24 DIAGNOSIS — R918 Other nonspecific abnormal finding of lung field: Secondary | ICD-10-CM | POA: Diagnosis not present

## 2023-10-24 MED ORDER — AZELASTINE HCL 0.1 % NA SOLN: 1.0000 | Freq: Two times a day (BID) | NASAL | 12 refills | Status: DC

## 2023-10-24 MED ORDER — LORATADINE 10 MG PO TABS: 10.0000 mg | ORAL_TABLET | ORAL | 3 refills | Status: DC | PRN

## 2023-10-24 MED ORDER — BENZONATATE 100 MG PO CAPS: 100.0000 mg | ORAL_CAPSULE | Freq: Three times a day (TID) | ORAL | 0 refills | Status: DC | PRN

## 2023-10-24 NOTE — Progress Notes (Signed)
 Subjective: CC: URI PCP: Eliodoro Guerin, DO UJW:[id number] H Raz is a 78 y.o. female presenting to clinic today for:  1.  URI Patient is accompanied today by her husband.  She reports that she had onset of sneezing, runny nose and right ear popping about 1 week ago.  She reports that she had been around a lot of old high school friends and then later was exposed to rain and she thinks this is given her an infection.  She denies any hemoptysis.  No wheezing.  No shortness of breath.  No brown sputum.  No sinus or facial pain.  She reports no purulence from naris.  She does report rhinorrhea is clear but seems to aggravate her cough much worse at nighttime.  She is been taking some Mucinex and this does help slightly.   ROS: Per HPI  Allergies  Allergen Reactions   Aspirin Nausea And Vomiting   Ciprofloxacin Other (See Comments)    Upset Stomach   Codeine Nausea And Vomiting    Makes me sick    Hydrocodone -Acetaminophen      Other reaction(s): Unknown  09/14/22: Patient states that when she takes any type of pain medication she starts vomiting blood. States she was told not to take any OTC pain medication.   Losartan      Hyperkalemia    Micardis [Telmisartan] Other (See Comments)    unknown   Nexium [Esomeprazole Magnesium ] Other (See Comments)    Causes internal bleeding   Niaspan [Niacin Er (Antihyperlipidemic)] Other (See Comments)    Reaction is unknown   Nsaids     Other reaction(s): Unknown   Onglyza [Saxagliptin ] Other (See Comments)    Reaction is unknown   Other     No otc pain medications   Oxycodone  Hcl     Other reaction(s): Unknown   Rofecoxib Other (See Comments)    unknown   Simvastatin    Tequin [Gatifloxacin] Other (See Comments)    Reaction is unknown   Welchol [Colesevelam Hcl]    Amoxicillin Nausea And Vomiting and Rash    Has patient had a PCN reaction causing immediate rash, facial/tongue/throat swelling, SOB or lightheadedness with hypotension:  Yes Has patient had a PCN reaction causing severe rash involving mucus membranes or skin necrosis: no  Has patient had a PCN reaction that required hospitalization No Has patient had a PCN reaction occurring within the last 10 years: No If all of the above answers are "NO", then may proceed with Cephalosporin use.    Past Medical History:  Diagnosis Date   Anal fistula    Anemia in chronic renal disease    Aranesp  injection --  when Hg <11, last injection 12-24-14.   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    CKD (chronic kidney disease), stage III [facility])    nephrologist--  dr Harry Lindau-- LOV  07-09-2016   Complication of anesthesia    post-op confusion    Constipation    Coronary artery disease    Diabetic gastroparesis (HCC)    Diabetic retinopathy (HCC)    bilateral --  monitored by dr Seward Dao   Diverticulosis of colon  GAD (generalized anxiety disorder)    GERD (gastroesophageal reflux disease)    Hip fracture requiring operative repair [facility])    History of colon polyps    benign   History of esophagitis    History of GI bleed    upper 2009  due to esophagitis  &  2002  due to Mallory-Weiss tear   History of hyperkalemia    pt had previously been canceled twice dos due to elevated K+, 07-29-2016 last date cancelled -- pt visited pcp same day treated w/ kayexelate and K+ came down, pt brought all her medication's in to pcp office and found pt was taking losartan  that had been discontinued due to ckd, pcp stated this was cause of elevated K+   History of Mallory-Weiss syndrome    12/ 2002--  resolved   History of rectal abscess    12-29-2004  bedside I & D    Hyperlipidemia    Hypertension    Hypothyroidism    LAFB (left anterior fascicular block)    Right bundle branch block    Sacral decubitus ulcer    since 2014- 01-02-15 remains with wound" gauze dressing changes daily.   Type II diabetes mellitus (HCC)     Current Outpatient Medications:    Accu-Chek Softclix Lancets lancets, Use as instructed, Disp: 100 each, Rfl: 12   amLODipine  (NORVASC ) 5 MG tablet, Take 1 tablet (5 mg total) by mouth daily., Disp: 90 tablet, Rfl: 3   atorvastatin  (LIPITOR) 10 MG tablet, Take 1 tablet (10 mg total) by mouth daily., Disp: 90 tablet, Rfl: 03   BOOSTRIX  5-2.5-18.5 LF-MCG/0.5 injection, , Disp: , Rfl:    busPIRone  (BUSPAR ) 5 MG tablet, Take 0.5-1 tablets (2.5-5 mg total) by mouth 2 (two) times daily as needed., Disp: 20 tablet, Rfl: 2   carvedilol  (COREG ) 12.5 MG tablet, TAKE 1 TABLET TWICE DAILY WITH A MEAL, Disp: 180 tablet, Rfl: 3   cholecalciferol  (VITAMIN D3) 25 MCG (1000 UNIT) tablet, Take 1 tablet (1,000 Units total) by mouth daily., Disp: 90 tablet, Rfl: 3   colchicine  0.6 MG tablet, Take 0.6 mg by mouth daily as needed., Disp: , Rfl:    Continuous Glucose Receiver (FREESTYLE LIBRE 2 READER) DEVI, Use to monitor glucose continuously as directed., Disp: 1 each, Rfl: 0   Continuous Glucose Sensor (FREESTYLE LIBRE 2 SENSOR) MISC, Use to monitor glucose continuously as directed., Disp: 6 each, Rfl: 2   Febuxostat  (ULORIC ) 80 MG TABS, 1 tablet Orally Once a day to manage gout for 30 day(s), Disp: , Rfl:    Febuxostat  80 MG TABS, Take by mouth., Disp: , Rfl:    fluticasone  (FLONASE ) 50 MCG/ACT nasal spray, Place 2 sprays into both nostrils daily. Use 2 sprays in each nostril daily, Disp: 16 g, Rfl: 3   furosemide  (LASIX ) 40 MG tablet, Take 1 tablet twice weekly, Disp: 26 tablet, Rfl: 3   glucose blood (ACCU-CHEK AVIVA PLUS) test strip, Use as instructed, Disp: 100 each, Rfl: 12   Insulin  Glargine (BASAGLAR  KWIKPEN) 100 UNIT/ML, Inject 6 Units into the skin  daily with breakfast., Disp: 15 mL, Rfl: 0   Lancets Misc. MISC, Use as directed to check blood sugar up to 4x daily. E11.65, Disp: 300 each, Rfl: 3   levocetirizine (XYZAL ) 5 MG tablet, Take 0.5 tablets (2.5 mg total) by mouth every 3 (three) days. PLEASE NOTE SIG.  PLEASE REVIEW WITH PT!  This is renally dosed for GFR <30, Disp: 30 tablet, Rfl: 3   levothyroxine  (  SYNTHROID ) 50 MCG tablet, Take 1 tablet (50 mcg total) by mouth daily before breakfast., Disp: 90 tablet, Rfl: 1   losartan  (COZAAR ) 100 MG tablet, 1 tablet Orally Once a day, Disp: , Rfl:    meclizine  (ANTIVERT ) 25 MG tablet, Take 0.5-1 tablets (12.5-25 mg total) by mouth 3 (three) times daily as needed for dizziness., Disp: 30 tablet, Rfl: 0   mupirocin ointment (BACTROBAN) 2 %, Apply topically as directed., Disp: , Rfl:    pantoprazole  (PROTONIX ) 20 MG tablet, Take 1 tablet (20 mg total) by mouth daily., Disp: 90 tablet, Rfl: 3   prednisoLONE  acetate (PRED FORTE ) 1 % ophthalmic suspension, Place 1 drop into the right eye 4 (four) times daily., Disp: 5 mL, Rfl: 0   silver  sulfADIAZINE  (SILVADENE ) 1 % cream, Apply topically as directed., Disp: , Rfl:    ULTICARE MICRO PEN NEEDLES 32G X 4 MM MISC, TWICE DAILY, Disp: 100 each, Rfl: 5 Social History   Socioeconomic History   Marital status: Married    Spouse name: [name]   Number of children: 0   Years of education: 12   Highest education level: Not on file  Occupational History   Occupation: Retired    Associate Professor: RETIRED  Tobacco Use   Smoking status: Never   Smokeless tobacco: Never  Vaping Use   Vaping status: Never Used  Substance and Sexual Activity   Alcohol use: No   Drug use: No   Sexual activity: Not Currently  Other Topics Concern   Not on file  Social History Narrative   Lives with husband.    Lost her mother in 1999   Caffeine use: none      01/19/22 No kids, support each other. Spouse diagnosed with cancer recently. She is legally blind but cares for spouse and  vice versa. As of 2023 married for 55 years per pt   Social Drivers of Corporate investment banker Strain: Low Risk  (05/27/2023)   Overall Financial Resource Strain (CARDIA)    Difficulty of Paying Living Expenses: Not hard at all  Food Insecurity: Food Insecurity Present (05/27/2023)   Hunger Vital Sign    Worried About Running Out of Food in the Last Year: Never true    Ran Out of Food in the Last Year: Sometimes true  Transportation Needs: No Transportation Needs (05/27/2023)   PRAPARE - Administrator, Civil Service (Medical): No    Lack of Transportation (Non-Medical): No  Physical Activity: Insufficiently Active (05/27/2023)   Exercise Vital Sign    Days of Exercise per Week: 3 days    Minutes of Exercise per Session: 30 min  Stress: No Stress Concern Present (05/27/2023)   [employer] of Occupational Health - Occupational Stress Questionnaire    Feeling of Stress : Not at all  Social Connections: Moderately Integrated (05/27/2023)   Social Connection and Isolation Panel [NHANES]    Frequency of Communication with Friends and Family: More than three times a week    Frequency of Social Gatherings with Friends and Family: Three times a week    Attends Religious Services: 1 to 4 times per year    Active Member of Clubs or Organizations: No    Attends Banker Meetings: Never    Marital Status: Married  Catering manager Violence: Not At Risk (05/27/2023)   Humiliation, Afraid, Rape, and Kick questionnaire    Fear of Current or Ex-Partner: No    Emotionally Abused: No    Physically Abused:  No    Sexually Abused: No   Family History  Problem Relation Age of Onset   Coronary artery disease Mother 50   Heart disease Mother    Diabetes Mother    Colon cancer Father 38   Prostate cancer Father    Diabetes Father    Coronary artery disease Sister 73   Diabetes Sister    Heart disease Sister    Diabetes Sister    Diabetes Sister    Diabetes Sister     Diabetes Sister    Diabetes Sister    Diabetes Maternal Grandmother    Esophageal cancer Neg Hx    Stomach cancer Neg Hx    Rectal cancer Neg Hx     Objective: Office vital signs reviewed. BP 125/67   Pulse 60   Temp (!) 97.3 F (36.3 C)   Ht 5\' 1"  (1.549 m)   Wt 146 lb (66.2 kg)   SpO2 93%   BMI 27.59 kg/m   Physical Examination:  General: Awake, alert, well nourished, No acute distress HEENT: Normal    Neck: No masses palpated. No lymphadenopathy    Ears: Tympanic membranes intact, normal light reflex, no erythema, mild bulging of the right TM    Eyes: PERRLA, extraocular membranes intact, sclera white    Nose: nasal turbinates moist, clear nasal discharge    Throat: moist mucus membranes, cobblestone appearance of the oropharynx, no tonsillar exudate.  Airway is patent Cardio: regular rate and rhythm, S1S2 heard, no murmurs appreciated Pulm: Decreased breath sounds in the right lung bases with mild expiratory wheeze noted intermittently.  Otherwise clear to auscultation bilaterally with normal work of breathing on room air and no coughing observed  Assessment/ Plan: 78 y.o. female   Rhinosinusitis - Plan: loratadine (CLARITIN) 10 MG tablet, azelastine (ASTELIN) 0.1 % nasal spray  Acute cough - Plan: DG Chest 2 View, benzonatate (TESSALON PERLES) 100 MG capsule  Seems allergic in nature.  Her lung exam was notable for decreased breath sounds in the right base so I obtained plain films and upon personal review this demonstrates a hemidiaphragm which explains the lack of good lung sounds in this area.  I given her Tessalon Perles for as needed use.  Start renally dosed Claritin, Astelin.  We discussed red flag signs and symptoms warranting further evaluation she voiced good understanding   Hagen Tidd Bambi Bonine, DO [facility] [phone]

## 2023-10-24 NOTE — Patient Instructions (Signed)
 Allergies, Adult  An allergy is when your body reacts to something that bothers it (allergen).  Allergies often affect the nose, eyes, skin, and stomach. They cannot spread from person to person. Allergies can start at any age. Sometimes, they go away as you get older.  What are the causes?  Outdoor things, like pollen, car fumes, and mold.  Indoor things, like dust, smoke, mold, and pets.  Foods.  Medicines.  Things that bother your skin. These include perfumes and bug bites.  What increases the risk?  Having family members with allergies or asthma.  What are the signs or symptoms?  Allergies may cause:  A runny nose, stuffy nose, or sneezing.  An itchy mouth, ears, or throat.  A feeling of mucus dripping down the back of your throat.  A sore throat.  Eyes that itch or are red, watery, or puffy.  A skin rash or red, swollen areas of skin (hives).  Stomach cramps or bloating.  If you have a very bad allergic reaction (anaphylactic reaction) to food, medicine, or bug bites, you may also have:  A red face.  Coughing.  Swollen lips, tongue, or mouth.  A tight or swollen throat.  Chest pain. Your chest may feel tight. Your heart may beat too fast.  Trouble breathing.  Pain in your belly (abdomen). You may vomit or have watery poop (diarrhea).  Fainting. Or you may feel dizzy.  If you have a very bad reaction to an allergy, you should get help right away.  How is this treated?         You may need to use:  Cold, wet cloths. These can help with itching and swelling.  Eye drops, nose sprays, or skin creams.  A saline solution to wash out your nose. Saline solutions are made of salt and water.  A humidifier.  Medicines.  You may need to change the foods you eat.  You may be given allergy shots, or a small bit of the allergen may be put under your tongue. These treatments can help your body get used to the allergen.  If you have a very bad allergic reaction, you may need to use an auto-injector pen.  An auto-injector pen is  a device filled with medicine. It gives you an emergency shot of epinephrine.  Your doctor will teach you how to use it.  Follow these instructions at home:  Medicines    Take or apply over-the-counter and prescription medicines only as told by your doctor.  Keep an auto-injector pen with you all the time if you might have a very bad allergic reaction.  Eating and drinking  Follow instructions from your doctor about what you may eat and drink.  Drink enough fluid to keep your pee (urine) pale yellow.  General instructions  If you have ever had a bad reaction, wear a medical alert bracelet or necklace.  Stay away from the things you are allergic to.  Keep all follow-up visits. Your doctor will check on how you are doing and talk about treatment options with you.  Contact a doctor if:  You do not get better with treatment.  Get help right away if:  You have a very bad allergic reaction.  You use your auto-injector pen. You must go to the hospital even if the medicine seems to be working.  These symptoms may be an emergency. Use the auto-injector pen right away. Then call 911.  Do not wait to see if the symptoms  will go away.  Do not drive yourself to the hospital.  This information is not intended to replace advice given to you by your health care provider. Make sure you discuss any questions you have with your health care provider.  Document Revised: 01/20/2022 Document Reviewed: 01/20/2022  Elsevier Patient Education  2024 [employer].

## 2023-11-14 DIAGNOSIS — L821 Other seborrheic keratosis: Secondary | ICD-10-CM | POA: Diagnosis not present

## 2023-11-22 ENCOUNTER — Telehealth: Payer: Self-pay | Admitting: "Endocrinology

## 2023-11-22 ENCOUNTER — Other Ambulatory Visit: Payer: Self-pay | Admitting: "Endocrinology

## 2023-11-22 ENCOUNTER — Other Ambulatory Visit: Payer: Self-pay | Admitting: Family Medicine

## 2023-11-22 DIAGNOSIS — Z794 Long term (current) use of insulin: Secondary | ICD-10-CM

## 2023-11-22 DIAGNOSIS — E119 Type 2 diabetes mellitus without complications: Secondary | ICD-10-CM

## 2023-11-22 MED ORDER — DAPAGLIFLOZIN PROPANEDIOL 5 MG PO TABS
5.0000 mg | ORAL_TABLET | Freq: Every day | ORAL | 2 refills | Status: DC
Start: 2023-11-22 — End: 2024-02-22

## 2023-11-22 MED ORDER — ACCU-CHEK SOFTCLIX LANCETS MISC: 12 refills | Status: DC

## 2023-11-22 NOTE — Telephone Encounter (Signed)
 Spoke with pt advising her to hold her insulin  for 2-3 days, contact the office if her fasting glucose readings start rising to or above 150 per Dr.Nida's orders. Also discussed with pt Dr.Nida will send in a Rx for Farxiga 5mg  daily to her pharmacy and if insurance with cover the Rx she will be able to discontinue insulin  per Dr.Nida. Pt voiced understanding. Rx for Farxiga 5mg  daily sent to [facility].

## 2023-11-22 NOTE — Telephone Encounter (Signed)
 Pt's husband is going to bring her CGM device to the office after lunch. Pt is taking Basaglar  6 units qam.

## 2023-11-22 NOTE — Telephone Encounter (Signed)
 Pt called stating Farxiga has a copay of over 100.00 for a 90 day supply and is not affordable.

## 2023-11-22 NOTE — Telephone Encounter (Signed)
 Pt called with high BG readings.   Date Before breakfast Before lunch Before supper Bedtime        11/21/23 82     11/22/23 95             Pt taking: Taking insulin  in the morning, Pt wants to know what to do.  She is stating that they have been low.  Pt stated she can't read any other readings due to eye sight is bad.  Pt would like a call back today on what to do

## 2023-11-22 NOTE — Telephone Encounter (Signed)
 Pt said Rx is over 100 for a 90 day supply.  Pt stated she can not afford.

## 2023-11-23 NOTE — Telephone Encounter (Signed)
 Tried to call pt at both listed telephone numbers, call would not go through either number.

## 2023-11-23 NOTE — Telephone Encounter (Signed)
 Spoke with pt advising her to finish the 30 day supply of Farxiga afterwards restart Basaglar  6 units daily with breakfast per Dr.Nida's orders. Pt voiced understanding.

## 2023-11-23 NOTE — Telephone Encounter (Signed)
 Patient called back, confirmed number, she filled 30 days, she can only do it this one time. Asking for something else

## 2023-11-29 DIAGNOSIS — M79674 Pain in right toe(s): Secondary | ICD-10-CM | POA: Diagnosis not present

## 2023-11-29 DIAGNOSIS — B351 Tinea unguium: Secondary | ICD-10-CM | POA: Diagnosis not present

## 2023-11-29 DIAGNOSIS — L84 Corns and callosities: Secondary | ICD-10-CM | POA: Diagnosis not present

## 2023-11-29 DIAGNOSIS — M79675 Pain in left toe(s): Secondary | ICD-10-CM | POA: Diagnosis not present

## 2023-11-29 DIAGNOSIS — E1142 Type 2 diabetes mellitus with diabetic polyneuropathy: Secondary | ICD-10-CM | POA: Diagnosis not present

## 2023-12-26 DIAGNOSIS — H35351 Cystoid macular degeneration, right eye: Secondary | ICD-10-CM | POA: Diagnosis not present

## 2023-12-26 DIAGNOSIS — E113553 Type 2 diabetes mellitus with stable proliferative diabetic retinopathy, bilateral: Secondary | ICD-10-CM | POA: Diagnosis not present

## 2023-12-26 DIAGNOSIS — T8529XD Other mechanical complication of intraocular lens, subsequent encounter: Secondary | ICD-10-CM | POA: Diagnosis not present

## 2023-12-26 DIAGNOSIS — H353134 Nonexudative age-related macular degeneration, bilateral, advanced atrophic with subfoveal involvement: Secondary | ICD-10-CM | POA: Diagnosis not present

## 2023-12-26 LAB — HM DIABETES EYE EXAM

## 2024-01-03 DIAGNOSIS — D6869 Other thrombophilia: Secondary | ICD-10-CM | POA: Diagnosis not present

## 2024-01-03 DIAGNOSIS — I129 Hypertensive chronic kidney disease with stage 1 through stage 4 chronic kidney disease, or unspecified chronic kidney disease: Secondary | ICD-10-CM | POA: Diagnosis not present

## 2024-01-05 ENCOUNTER — Telehealth: Payer: Self-pay

## 2024-01-05 DIAGNOSIS — M2550 Pain in unspecified joint: Secondary | ICD-10-CM

## 2024-01-05 DIAGNOSIS — Z7409 Other reduced mobility: Secondary | ICD-10-CM

## 2024-01-05 NOTE — Telephone Encounter (Signed)
 Copied from CRM [id number]. Topic: General - Other >> Jan 05, 2024 10:48 AM Leonette SQUIBB wrote: Reason for CRM: Pt called asking if a nurse will call her back regarding a paper she needs completed.  CB#  [phone]

## 2024-01-06 NOTE — Telephone Encounter (Signed)
 Left message to call back.

## 2024-01-06 NOTE — Telephone Encounter (Signed)
 Spoke with Ms Freundlich and she says instead of getting an order for a regular wheelchair, she needs one for a transporting chair because it is lighter than a regular wheelchair.   She says she has already spoken with her insurance on how much they would cover and has also spoken with [facility] pharmacy who confirmed that they do accept order.

## 2024-01-12 NOTE — Telephone Encounter (Signed)
 Patient aware

## 2024-01-12 NOTE — Telephone Encounter (Signed)
 Please inform we will get this sent out today.  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  A cane, crutch, or walker will not resolve issue with performing activities of daily living. A wheelchair will allow patient to safely perform daily activities. Patient can safely propel the wheelchair in the home or has a caregiver who can provide assistance. Length of need Lifetime. Accessories: elevating leg rests (ELRs), wheel locks, extensions and anti-tippers.

## 2024-01-12 NOTE — Addendum Note (Signed)
 Addended by: JOLINDA NORENE HERO on: 01/12/2024 07:41 AM   Modules accepted: Orders

## 2024-01-14 ENCOUNTER — Other Ambulatory Visit: Payer: Self-pay | Admitting: Family Medicine

## 2024-02-07 DIAGNOSIS — B351 Tinea unguium: Secondary | ICD-10-CM | POA: Diagnosis not present

## 2024-02-07 DIAGNOSIS — E1142 Type 2 diabetes mellitus with diabetic polyneuropathy: Secondary | ICD-10-CM | POA: Diagnosis not present

## 2024-02-07 DIAGNOSIS — L84 Corns and callosities: Secondary | ICD-10-CM | POA: Diagnosis not present

## 2024-02-07 DIAGNOSIS — M79674 Pain in right toe(s): Secondary | ICD-10-CM | POA: Diagnosis not present

## 2024-02-07 DIAGNOSIS — M79675 Pain in left toe(s): Secondary | ICD-10-CM | POA: Diagnosis not present

## 2024-02-13 ENCOUNTER — Other Ambulatory Visit

## 2024-02-13 DIAGNOSIS — N1832 Chronic kidney disease, stage 3b: Secondary | ICD-10-CM | POA: Diagnosis not present

## 2024-02-13 DIAGNOSIS — E1122 Type 2 diabetes mellitus with diabetic chronic kidney disease: Secondary | ICD-10-CM | POA: Diagnosis not present

## 2024-02-13 DIAGNOSIS — Z794 Long term (current) use of insulin: Secondary | ICD-10-CM | POA: Diagnosis not present

## 2024-02-14 ENCOUNTER — Emergency Department (HOSPITAL_COMMUNITY)

## 2024-02-14 ENCOUNTER — Emergency Department (HOSPITAL_COMMUNITY)
Admission: EM | Admit: 2024-02-14 | Discharge: 2024-02-14 | Disposition: A | Discharge status: Home / Self Care | Attending: Emergency Medicine | Admitting: Emergency Medicine

## 2024-02-14 ENCOUNTER — Other Ambulatory Visit: Payer: Self-pay

## 2024-02-14 ENCOUNTER — Encounter (HOSPITAL_COMMUNITY): Payer: Self-pay | Admitting: *Deleted

## 2024-02-14 ENCOUNTER — Encounter (HOSPITAL_COMMUNITY): Payer: Self-pay

## 2024-02-14 DIAGNOSIS — M48061 Spinal stenosis, lumbar region without neurogenic claudication: Secondary | ICD-10-CM | POA: Diagnosis not present

## 2024-02-14 DIAGNOSIS — M8580 Other specified disorders of bone density and structure, unspecified site: Secondary | ICD-10-CM | POA: Diagnosis not present

## 2024-02-14 DIAGNOSIS — I10 Essential (primary) hypertension: Secondary | ICD-10-CM | POA: Diagnosis not present

## 2024-02-14 DIAGNOSIS — Z79899 Other long term (current) drug therapy: Secondary | ICD-10-CM | POA: Insufficient documentation

## 2024-02-14 DIAGNOSIS — S32018A Other fracture of first lumbar vertebra, initial encounter for closed fracture: Secondary | ICD-10-CM | POA: Insufficient documentation

## 2024-02-14 DIAGNOSIS — Y92009 Unspecified place in unspecified non-institutional (private) residence as the place of occurrence of the external cause: Secondary | ICD-10-CM | POA: Diagnosis not present

## 2024-02-14 DIAGNOSIS — W010XXA Fall on same level from slipping, tripping and stumbling without subsequent striking against object, initial encounter: Secondary | ICD-10-CM | POA: Insufficient documentation

## 2024-02-14 DIAGNOSIS — S32010A Wedge compression fracture of first lumbar vertebra, initial encounter for closed fracture: Secondary | ICD-10-CM | POA: Diagnosis not present

## 2024-02-14 DIAGNOSIS — M545 Low back pain, unspecified: Secondary | ICD-10-CM | POA: Diagnosis not present

## 2024-02-14 DIAGNOSIS — M4807 Spinal stenosis, lumbosacral region: Secondary | ICD-10-CM | POA: Diagnosis not present

## 2024-02-14 DIAGNOSIS — S3992XA Unspecified injury of lower back, initial encounter: Secondary | ICD-10-CM | POA: Diagnosis not present

## 2024-02-14 LAB — COMPREHENSIVE METABOLIC PANEL WITH GFR
ALT: 3 IU/L (ref 0–32)
AST: 12 IU/L (ref 0–40)
Albumin: 3.9 g/dL (ref 3.8–4.8)
Alkaline Phosphatase: 125 IU/L (ref 49–135)
BUN/Creatinine Ratio: 16 (ref 12–28)
BUN: 30 mg/dL — ABNORMAL HIGH (ref 8–27)
Bilirubin Total: 0.6 mg/dL (ref 0.0–1.2)
CO2: 17 mmol/L — ABNORMAL LOW (ref 20–29)
Calcium: 9.1 mg/dL (ref 8.7–10.3)
Chloride: 105 mmol/L (ref 96–106)
Creatinine, Ser: 1.82 mg/dL — ABNORMAL HIGH (ref 0.57–1.00)
Globulin, Total: 2.5 g/dL (ref 1.5–4.5)
Glucose: 159 mg/dL — ABNORMAL HIGH (ref 70–99)
Potassium: 5.2 mmol/L (ref 3.5–5.2)
Sodium: 140 mmol/L (ref 134–144)
Total Protein: 6.4 g/dL (ref 6.0–8.5)
eGFR: 28 mL/min/1.73 — ABNORMAL LOW (ref >59)

## 2024-02-14 LAB — LIPID PANEL
Chol/HDL Ratio: 2.9 ratio (ref 0.0–4.4)
Cholesterol, Total: 117 mg/dL (ref 100–199)
HDL: 41 mg/dL (ref >39)
LDL Chol Calc (NIH): 55 mg/dL (ref 0–99)
Triglycerides: 114 mg/dL (ref 0–149)
VLDL Cholesterol Cal: 21 mg/dL (ref 5–40)

## 2024-02-14 LAB — CBG MONITORING, ED: Glucose-Capillary: 222 mg/dL — ABNORMAL HIGH (ref 70–99)

## 2024-02-14 LAB — T4, FREE: Free T4: 1.16 ng/dL (ref 0.82–1.77)

## 2024-02-14 LAB — TSH: TSH: 4.26 u[IU]/mL (ref 0.450–4.500)

## 2024-02-14 MED ORDER — HYDROCODONE-ACETAMINOPHEN 5-325 MG PO TABS
1.0000 | ORAL_TABLET | Freq: Once | ORAL | Status: AC
  Administered 2024-02-14: 1 via ORAL
  Filled 2024-02-14: qty 1

## 2024-02-14 MED ORDER — HYDROCODONE-ACETAMINOPHEN 5-325 MG PO TABS: 1.0000 | ORAL_TABLET | Freq: Three times a day (TID) | ORAL | 0 refills | Status: DC | PRN

## 2024-02-14 MED ORDER — ONDANSETRON 4 MG PO TBDP
4.0000 mg | ORAL_TABLET | Freq: Once | ORAL | Status: AC
  Administered 2024-02-14: 4 mg via ORAL
  Filled 2024-02-14: qty 1

## 2024-02-14 MED ORDER — ONDANSETRON 4 MG PO TBDP: 4.0000 mg | ORAL_TABLET | Freq: Four times a day (QID) | ORAL | 0 refills | Status: DC | PRN

## 2024-02-14 NOTE — ED Provider Notes (Signed)
 [place] EMERGENCY DEPARTMENT AT [facility] Provider Note   CSN: [id number] Arrival date & time: 02/14/24  8366     Patient presents with: Fall and Back Pain   [name] is a 78 y.o. female.  She is here with her husband today for evaluation of back pain in the lower back that this started after a fall at home 3 days ago.  She states that her husband had cleaned their dining room but had not put the chairs back, she is walking and did not realize that the chair had been moved and tripped and fell down and has been having back pain since then.  She has not struck her head, no loss of consciousness, did not get dizzy before the fall.  She is having a lot of trouble sitting up or standing or changing positions.  She has been sitting in her recliner to sleep due to the pain.  She states over-the-counter medicines seem to make her very nauseated so she has not taken anything for the pain, denies numbness tingling or weakness, denies fever or chills, no saddle anesthesia or paresthesia, no bowel or bladder incontinence.    Fall  Back Pain      Prior to Admission medications   Medication Sig Start Date End Date Taking? Authorizing Provider  HYDROcodone -acetaminophen  (NORCO/VICODIN) 5-325 MG tablet Take 1 tablet by mouth every 8 (eight) hours as needed. 02/14/24  Yes Niki Payment A, PA-C  ondansetron  (ZOFRAN -ODT) 4 MG disintegrating tablet Take 1 tablet (4 mg total) by mouth every 6 (six) hours as needed for nausea or vomiting. 02/14/24  Yes Suellen, Zoran Yankee A, PA-C  Accu-Chek Softclix Lancets lancets Test BS daily as needed Dx E11.22 11/22/23   Jolinda Norene HERO, DO  amLODipine  (NORVASC ) 5 MG tablet Take 1 tablet (5 mg total) by mouth daily. 07/18/23   Jolinda Norene HERO, DO  atorvastatin  (LIPITOR) 10 MG tablet Take 1 tablet (10 mg total) by mouth daily. 07/18/23   Jolinda Norene HERO, DO  azelastine  (ASTELIN ) 0.1 % nasal spray Place 1 spray into both nostrils 2 (two) times  daily. For drainage 10/24/23   Jolinda Norene M, DO  benzonatate  (TESSALON  PERLES) 100 MG capsule Take 1 capsule (100 mg total) by mouth 3 (three) times daily as needed. 10/24/23   Jolinda Norene HERO, DO  BOOSTRIX  5-2.5-18.5 LF-MCG/0.5 injection  03/03/23   [provider]  busPIRone  (BUSPAR ) 5 MG tablet Take 0.5-1 tablets (2.5-5 mg total) by mouth 2 (two) times daily as needed. 11/26/22   Lavell Lye A, FNP  carvedilol  (COREG ) 12.5 MG tablet TAKE 1 TABLET 2 TIMES A DAY WITH MEALS **NEEDS TO BE SEEN BEFORE NEXT REFILL** 01/16/24   Jolinda Norene M, DO  cholecalciferol  (VITAMIN D3) 25 MCG (1000 UNIT) tablet Take 1 tablet (1,000 Units total) by mouth daily. 03/04/23   Jolinda Norene HERO, DO  colchicine  0.6 MG tablet Take 0.6 mg by mouth daily as needed.    [provider]  Continuous Glucose Receiver (FREESTYLE LIBRE 2 READER) DEVI Use to monitor glucose continuously as directed. 07/25/23   Nida, Gebreselassie W, MD  Continuous Glucose Sensor (FREESTYLE LIBRE 2 SENSOR) MISC Use to monitor glucose continuously as directed. 08/29/23   Nida, Gebreselassie W, MD  dapagliflozin  propanediol (FARXIGA ) 5 MG TABS tablet Take 1 tablet (5 mg total) by mouth daily. 11/22/23   Nida, Gebreselassie W, MD  Febuxostat  (ULORIC ) 80 MG TABS 1 tablet Orally Once a day to manage gout for 30  day(s) 06/28/18   [provider]  Febuxostat  80 MG TABS Take by mouth.    [provider]  fluticasone  (FLONASE ) 50 MCG/ACT nasal spray Place 2 sprays into both nostrils daily. Use 2 sprays in each nostril daily 04/01/21   Jolinda Potter M, DO  furosemide  (LASIX ) 40 MG tablet Take 1 tablet twice weekly 07/18/23   Gottschalk, Ashly M, DO  glucose blood (ACCU-CHEK AVIVA PLUS) test strip Test BS daily as needed Dx E11.22 11/22/23   Jolinda Potter HERO, DO  Insulin  Glargine (BASAGLAR  [employer]) 100 UNIT/ML Inject 6 Units into the skin daily with breakfast. 08/29/23   Nida, Ethelle ORN, MD  Lancets Misc. MISC  Use as directed to check blood sugar up to 4x daily. E11.65 02/27/19   Jolinda Potter HERO, DO  levothyroxine  (SYNTHROID ) 50 MCG tablet Take 1 tablet (50 mcg total) by mouth daily before breakfast. 06/01/23   Nida, Ethelle ORN, MD  loratadine  (CLARITIN ) 10 MG tablet Take 1 tablet (10 mg total) by mouth every other day as needed for allergies (drainage/ sneezing). 10/24/23   Jolinda Potter HERO, DO  losartan  (COZAAR ) 100 MG tablet 1 tablet Orally Once a day    [provider]  meclizine  (ANTIVERT ) 25 MG tablet Take 0.5-1 tablets (12.5-25 mg total) by mouth 3 (three) times daily as needed for dizziness. 07/18/23   Jolinda Potter HERO, DO  mupirocin ointment (BACTROBAN) 2 % Apply topically as directed. 10/13/21   [provider]  pantoprazole  (PROTONIX ) 20 MG tablet Take 1 tablet (20 mg total) by mouth daily. 07/18/23   Jolinda Potter HERO, DO  prednisoLONE  acetate (PRED FORTE ) 1 % ophthalmic suspension Place 1 drop into the right eye 4 (four) times daily. 10/17/19   Rankin, Arley LABOR, MD  silver  sulfADIAZINE  (SILVADENE ) 1 % cream Apply topically as directed. 01/12/22   [provider]  DANYA MICRO PEN NEEDLES 32G X 4 MM MISC TWICE DAILY 10/26/18   Nida, Gebreselassie W, MD    Allergies: Aspirin, Ciprofloxacin, Codeine, Hydrocodone -acetaminophen , Losartan , Micardis [telmisartan], Nexium [esomeprazole magnesium ], Niaspan [niacin er (antihyperlipidemic)], Nsaids, Onglyza [saxagliptin ], Other, Oxycodone  hcl, Rofecoxib, Simvastatin, Tequin [gatifloxacin], Welchol [colesevelam hcl], and Amoxicillin    Review of Systems  Musculoskeletal:  Positive for back pain.    Updated Vital Signs BP (!) 140/75   Pulse 60   Temp 98.1 F (36.7 C)   Resp 17   Ht 5' 1 (1.549 m)   Wt 66.2 kg   SpO2 97%   BMI 27.58 kg/m   Physical Exam Vitals and nursing note reviewed.  Constitutional:      General: She is not in acute distress.    Appearance: She is well-developed.  HENT:     Head:  Normocephalic and atraumatic.     Mouth/Throat:     Mouth: Mucous membranes are moist.  Eyes:     Extraocular Movements: Extraocular movements intact.     Conjunctiva/sclera: Conjunctivae normal.     Pupils: Pupils are equal, round, and reactive to light.  Cardiovascular:     Rate and Rhythm: Normal rate and regular rhythm.     Heart sounds: No murmur heard. Pulmonary:     Effort: Pulmonary effort is normal. No respiratory distress.     Breath sounds: Normal breath sounds.  Abdominal:     Palpations: Abdomen is soft.     Tenderness: There is no abdominal tenderness.  Musculoskeletal:        General: No swelling.     Cervical back: Neck supple.  Right lower leg: No edema.     Left lower leg: No edema.     Comments: Point tenderness over L1 vertebrae, no other bony tenderness on exam.  Skin:    General: Skin is warm and dry.     Capillary Refill: Capillary refill takes less than 2 seconds.  Neurological:     General: No focal deficit present.     Mental Status: She is alert and oriented to person, place, and time.     Sensory: No sensory deficit.     Motor: No weakness.  Psychiatric:        Mood and Affect: Mood normal.     (all labs ordered are listed, but only abnormal results are displayed) Labs Reviewed  CBG MONITORING, ED - Abnormal; Notable for the following components:      Result Value   Glucose-Capillary 222 (*)    All other components within normal limits    EKG: None  Radiology: DG Lumbar Spine 2-3 Views Result Date: 02/14/2024 CLINICAL DATA:  Fall. EXAM: LUMBAR SPINE - 2-3 VIEW COMPARISON:  Lumbar spine x-ray 12/05/2014. FINDINGS: Mild compression deformity of the superior endplate of L1 is new from 2016. Compression deformities of L3 and L4 have not significantly change. Spinal alignment is normal. There is moderate disc space narrowing at L5-S1. There is severe disc space narrowing at L2-L3. The bones are markedly osteopenic. Right-sided hip screw is  present. There severe atherosclerotic calcifications of the aorta. IMPRESSION: 1. Mild compression deformity of the superior endplate of L1 is new from 2016. Correlate clinically for acuity. 2. Compression deformities of L3 and L4 have not significantly change. 3. Marked osteopenia. Electronically Signed   By: Greig Pique M.D.   On: 02/14/2024 17:16     Procedures   Medications Ordered in the ED  HYDROcodone -acetaminophen  (NORCO/VICODIN) 5-325 MG per tablet 1 tablet (1 tablet Oral Given 02/14/24 1936)  ondansetron  (ZOFRAN -ODT) disintegrating tablet 4 mg (4 mg Oral Given 02/14/24 1936)                                    Medical Decision Making Differential diagnose includes but not limited to fracture, sprain, strain, contusion, other  ED course: Patient has been having back pain since a mechanical fall several days ago.  She has no neurologic deficits.  She is point tender over L1, x-rays show mild endplate compression deformity.  She was given Norco for pain.  Given Zofran  along with this as she states in the past it is make her out of nausea or vomiting.  She tolerated this well.  Given LSO brace and advised on follow-up with neurosurgery.  She is given strict return precautions.  She did not have head injury or any other complaints.  Amount and/or Complexity of Data Reviewed External Data Reviewed: notes. Radiology: ordered and independent interpretation performed. Decision-making details documented in ED Course.    Details: Mild endplate compression deformity L1 new from 2016, compression deformities of L3 on L4 similar to prior  Risk Prescription drug management.   \]     Final diagnoses:  Compression fracture of L1 lumbar vertebra, closed, initial encounter [facility])    ED Discharge Orders          Ordered    HYDROcodone -acetaminophen  (NORCO/VICODIN) 5-325 MG tablet  Every 8 hours PRN        02/14/24 2214    ondansetron  (ZOFRAN -ODT) 4 MG disintegrating  tablet  Every 6 hours  PRN        02/14/24 2214               Suellen Sherran LABOR, PA-C 02/14/24 2344    Elnor Jayson LABOR, DO 02/15/24 [phone]

## 2024-02-14 NOTE — ED Triage Notes (Signed)
 Pt arrived via POV c/o left lower back pain that occurred from a fall at home yesterday. Pt reports helping her husband clean and went to sit down and the chair was moved. Pt denies hitting her head, denies LOC.

## 2024-02-14 NOTE — ED Notes (Signed)
 Contacted [facility] ortho tech regarding LSO brace - Ortho tech stated that they would reach out the [facility] again regarding the patient having the brace placed.

## 2024-02-14 NOTE — ED Notes (Signed)
 Pt is awaiting placement of brace prior to discharge. Ortho tech coming from [facility].

## 2024-02-14 NOTE — Progress Notes (Signed)
 Orthopedic Tech Progress Note Patient Details:  [name] [birth year] [id number]  Patient ID: [name] Fenton, female   DOB: 03-17-1946, 78 y.o.   MRN: [id number] Called in [facility] for LSO brace.  [name] 02/14/2024, 9:50 PM

## 2024-02-14 NOTE — Discharge Instructions (Addendum)
 Is a pleasure taking care of you today.  You were seen for back pain after recent fall.  Her x-rays show mild compression fracture at L1.  You are prescribed Norco to take as needed for pain.  Otherwise you can take over-the-counter medication as directed on packaging.  I have ordered you a back brace to help with the pain.  Follow-up with the spine doctor.  Come back to the ER for any new or worsening symptoms.  You were prescribed opiate pain medication. This can have many sided effect such as drowsiness, slowed breathing, and conspitation. Do not take it with other medications that cause drowsiness, or drink alcohol while taking it. Take a stool softener over the counter while taking it.

## 2024-02-14 NOTE — ED Notes (Signed)
 Pt reports is has been more than 2 hours since ortho tech was contacted. Reassured her that they would be here as soon as possible.

## 2024-02-15 ENCOUNTER — Telehealth: Payer: Self-pay

## 2024-02-15 NOTE — Telephone Encounter (Signed)
 I am not sure what this is pertaining to but I do not see and Elma Hurst on her DPR so if she is requesting me to contact her regarding the patient's care I have to have Ms. Ayotte sign off on me talking to her

## 2024-02-15 NOTE — Telephone Encounter (Signed)
 Called and spoke to patient, she fell Saturday and broke her back. She is not able to do any of her daily activities, needs help going to the bathroom and bathing. Patient is also unable to cook.  She needs home health and she also needs an order for a small compact walker as it is hard to get through her home with the one that she has.

## 2024-02-15 NOTE — Telephone Encounter (Signed)
 Copied from CRM [id number]. Topic: Clinical - Home Health Verbal Orders >> Feb 15, 2024  9:15 AM Richerd B wrote: Caller/Agency: Merna Hurst patient's friend Callback Number: [phone] Service Requested: Skilled Nursing/ help at home Frequency: ? Any new concerns about the patient? no

## 2024-02-16 NOTE — Telephone Encounter (Signed)
 Called and spoke with patient about trying to schedule for a virtual visit. She states that she doesn't have a smart phone or a computer and is not able to do a video visit. I also spoke with a family member there that was visiting with her and they also could not help.

## 2024-02-17 NOTE — Telephone Encounter (Signed)
 Sadly, then the only option she has is to come in for a visit.  Her insurance will not cover home health with out a face to face visit.

## 2024-02-20 ENCOUNTER — Telehealth (INDEPENDENT_AMBULATORY_CARE_PROVIDER_SITE_OTHER): Admitting: Family Medicine

## 2024-02-20 ENCOUNTER — Encounter: Payer: Self-pay | Admitting: Family Medicine

## 2024-02-20 DIAGNOSIS — M8000XA Age-related osteoporosis with current pathological fracture, unspecified site, initial encounter for fracture: Secondary | ICD-10-CM | POA: Diagnosis not present

## 2024-02-20 DIAGNOSIS — Z7409 Other reduced mobility: Secondary | ICD-10-CM

## 2024-02-20 DIAGNOSIS — S32010D Wedge compression fracture of first lumbar vertebra, subsequent encounter for fracture with routine healing: Secondary | ICD-10-CM

## 2024-02-20 DIAGNOSIS — M2550 Pain in unspecified joint: Secondary | ICD-10-CM | POA: Diagnosis not present

## 2024-02-20 NOTE — Telephone Encounter (Signed)
 Patient called back this morning regarding this. I made her aware that she would have to be seen in person or do a video visit in order for insurance to pay for home health services that she is requesting. Patient says she is bed ridden and can't come in. She is going to call around and see if someone can help her do a video visit. If she finds someone, she will call back to make an appt.   Needs to be scheduled as a 30 min video visit for Home Health.

## 2024-02-21 ENCOUNTER — Telehealth: Payer: Self-pay

## 2024-02-21 NOTE — Telephone Encounter (Unsigned)
 Copied from CRM #[id number]. Topic: General - Other >> Feb 21, 2024  1:42 PM Yolanda T wrote: Reason for CRM: patient called stated she was speaking with someone and the line disconnected. No documentation so unsure who she was speaking with. Per [employer] message was sent out but no one responded. Please return patient call as she said she needs to know something about her therapy before the end of he day

## 2024-02-21 NOTE — Progress Notes (Signed)
 Subjective: CC: virtual visit changed to in person visit (performed at patient's home) PCP: Jolinda Norene HERO, DO YEP:[name] is a 78 y.o. female presenting today for:  Lumbar fracture Patient sustained a mechanical fall over a chair several days ago.  She was seen in the ER and found to have a lumbar fracture at L1.  She is generally somewhat impaired with ambulation and now that she has had this fracture things seem to have gotten a little worse.  Her husband has been able to help with some things but she needs more help with things like bathing, dressing and with chores around the home.  She denies any gross swelling or pain in her back today because she has been utilizing Tylenol  around-the-clock.  She does still have Norco on hand if needed but has only used 1 of these tablets because she gets constipated off of them.  Cannot take NSAIDs secondary to CKD.  Blood sugars have been running okay.  Has known osteoporosis and we discussed consideration for Prolia at 1 point.  However, she has low vitamin D .  Calcium  was normal at 9.1 in the ER.  GFR was 28.  Sees Dr. Prescilla for renal   ROS: Per HPI  Allergies  Allergen Reactions   Aspirin Nausea And Vomiting   Ciprofloxacin Other (See Comments)    Upset Stomach   Codeine Nausea And Vomiting    Makes me sick    Hydrocodone -Acetaminophen      Other reaction(s): Unknown  09/14/22: Patient states that when she takes any type of pain medication she starts vomiting blood. States she was told not to take any OTC pain medication.   Losartan      Hyperkalemia    Micardis [Telmisartan] Other (See Comments)    unknown   Nexium [Esomeprazole Magnesium ] Other (See Comments)    Causes internal bleeding   Niaspan [Niacin Er (Antihyperlipidemic)] Other (See Comments)    Reaction is unknown   Nsaids     Other reaction(s): Unknown   Onglyza [Saxagliptin ] Other (See Comments)    Reaction is unknown   Other     No otc pain medications    Oxycodone  Hcl     Other reaction(s): Unknown   Rofecoxib Other (See Comments)    unknown   Simvastatin    Tequin [Gatifloxacin] Other (See Comments)    Reaction is unknown   Welchol [Colesevelam Hcl]    Amoxicillin Nausea And Vomiting and Rash    Has patient had a PCN reaction causing immediate rash, facial/tongue/throat swelling, SOB or lightheadedness with hypotension: Yes Has patient had a PCN reaction causing severe rash involving mucus membranes or skin necrosis: no  Has patient had a PCN reaction that required hospitalization No Has patient had a PCN reaction occurring within the last 10 years: No If all of the above answers are NO, then may proceed with Cephalosporin use.    Past Medical History:  Diagnosis Date   Anal fistula    Anemia in chronic renal disease    Aranesp  injection --  when Hg <11, last injection 12-24-14.   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  CKD (chronic kidney disease), stage III [facility])    nephrologist--  dr froylan-- LOV  07-09-2016   Complication of anesthesia    post-op confusion    Constipation    Coronary artery disease    Diabetic gastroparesis (HCC)    Diabetic retinopathy (HCC)    bilateral --  monitored by dr elner   Diverticulosis of colon    GAD (generalized anxiety disorder)    GERD (gastroesophageal reflux disease)    Hip fracture requiring operative repair [facility])    History of colon polyps    benign   History of esophagitis    History of GI bleed    upper 2009  due to esophagitis  &  2002  due to Mallory-Weiss tear   History of hyperkalemia    pt had previously been canceled twice dos due to elevated K+,  07-29-2016 last date cancelled -- pt visited pcp same day treated w/ kayexelate and K+ came down, pt brought all her medication's in to pcp office and found pt was taking losartan  that had been discontinued due to ckd, pcp stated this was cause of elevated K+   History of Mallory-Weiss syndrome    12/ 2002--  resolved   History of rectal abscess    12-29-2004  bedside I & D   Hyperlipidemia    Hypertension    Hypothyroidism    LAFB (left anterior fascicular block)    Right bundle branch block    Sacral decubitus ulcer    since 2014- 01-02-15 remains with wound gauze dressing changes daily.   Type II diabetes mellitus (HCC)     Current Outpatient Medications:    Accu-Chek Softclix Lancets lancets, Test BS daily as needed Dx E11.22, Disp: 100 each, Rfl: 12   amLODipine  (NORVASC ) 5 MG tablet, Take 1 tablet (5 mg total) by mouth daily., Disp: 90 tablet, Rfl: 3   atorvastatin  (LIPITOR) 10 MG tablet, Take 1 tablet (10 mg total) by mouth daily., Disp: 90 tablet, Rfl: 03   azelastine  (ASTELIN ) 0.1 % nasal spray, Place 1 spray into both nostrils 2 (two) times daily. For drainage, Disp: 30 mL, Rfl: 12   BOOSTRIX  5-2.5-18.5 LF-MCG/0.5 injection, , Disp: , Rfl:    busPIRone  (BUSPAR ) 5 MG tablet, Take 0.5-1 tablets (2.5-5 mg total) by mouth 2 (two) times daily as needed., Disp: 20 tablet, Rfl: 2   carvedilol  (COREG ) 12.5 MG tablet, TAKE 1 TABLET 2 TIMES A DAY WITH MEALS **NEEDS TO BE SEEN BEFORE NEXT REFILL**, Disp: 60 tablet, Rfl: 0   cholecalciferol  (VITAMIN D3) 25 MCG (1000 UNIT) tablet, Take 1 tablet (1,000 Units total) by mouth daily., Disp: 90 tablet, Rfl: 3   colchicine  0.6 MG tablet, Take 0.6 mg by mouth daily as needed., Disp: , Rfl:    Continuous Glucose Receiver (FREESTYLE LIBRE 2 READER) DEVI, Use to monitor glucose continuously as directed., Disp: 1 each, Rfl: 0   Continuous Glucose Sensor (FREESTYLE LIBRE 2 SENSOR) MISC, Use to monitor glucose continuously as directed., Disp: 6 each, Rfl:  2   dapagliflozin  propanediol (FARXIGA ) 5 MG TABS tablet, Take 1 tablet (5 mg total) by mouth daily., Disp: 30 tablet, Rfl: 2   Febuxostat  (ULORIC ) 80 MG TABS, 1 tablet Orally Once a day to manage gout for 30 day(s), Disp: , Rfl:    Febuxostat  80 MG TABS, Take by mouth., Disp: , Rfl:    fluticasone  (FLONASE ) 50 MCG/ACT nasal spray, Place 2 sprays into both nostrils daily. Use 2 sprays in each nostril  daily, Disp: 16 g, Rfl: 3   furosemide  (LASIX ) 40 MG tablet, Take 1 tablet twice weekly, Disp: 26 tablet, Rfl: 3   glucose blood (ACCU-CHEK AVIVA PLUS) test strip, Test BS daily as needed Dx E11.22, Disp: 100 strip, Rfl: 12   HYDROcodone -acetaminophen  (NORCO/VICODIN) 5-325 MG tablet, Take 1 tablet by mouth every 8 (eight) hours as needed., Disp: 6 tablet, Rfl: 0   Insulin  Glargine (BASAGLAR  KWIKPEN) 100 UNIT/ML, Inject 6 Units into the skin daily with breakfast., Disp: 15 mL, Rfl: 0   Lancets Misc. MISC, Use as directed to check blood sugar up to 4x daily. E11.65, Disp: 300 each, Rfl: 3   levothyroxine  (SYNTHROID ) 50 MCG tablet, Take 1 tablet (50 mcg total) by mouth daily before breakfast., Disp: 90 tablet, Rfl: 1   loratadine  (CLARITIN ) 10 MG tablet, Take 1 tablet (10 mg total) by mouth every other day as needed for allergies (drainage/ sneezing)., Disp: 45 tablet, Rfl: 3   losartan  (COZAAR ) 100 MG tablet, 1 tablet Orally Once a day, Disp: , Rfl:    meclizine  (ANTIVERT ) 25 MG tablet, Take 0.5-1 tablets (12.5-25 mg total) by mouth 3 (three) times daily as needed for dizziness., Disp: 30 tablet, Rfl: 0   mupirocin ointment (BACTROBAN) 2 %, Apply topically as directed., Disp: , Rfl:    ondansetron  (ZOFRAN -ODT) 4 MG disintegrating tablet, Take 1 tablet (4 mg total) by mouth every 6 (six) hours as needed for nausea or vomiting., Disp: 15 tablet, Rfl: 0   pantoprazole  (PROTONIX ) 20 MG tablet, Take 1 tablet (20 mg total) by mouth daily., Disp: 90 tablet, Rfl: 3   prednisoLONE  acetate (PRED FORTE ) 1 %  ophthalmic suspension, Place 1 drop into the right eye 4 (four) times daily., Disp: 5 mL, Rfl: 0   silver  sulfADIAZINE  (SILVADENE ) 1 % cream, Apply topically as directed., Disp: , Rfl:    ULTICARE MICRO PEN NEEDLES 32G X 4 MM MISC, TWICE DAILY, Disp: 100 each, Rfl: 5 Social History   Socioeconomic History   Marital status: Married    Spouse name: [name]   Number of children: 0   Years of education: 12   Highest education level: Not on file  Occupational History   Occupation: Retired    Associate Professor: RETIRED  Tobacco Use   Smoking status: Never   Smokeless tobacco: Never  Vaping Use   Vaping status: Never Used  Substance and Sexual Activity   Alcohol use: No   Drug use: No   Sexual activity: Not Currently  Other Topics Concern   Not on file  Social History Narrative   Lives with husband.    Lost her mother in 1999   Caffeine use: none      01/19/22 No kids, support each other. Spouse diagnosed with cancer recently. She is legally blind but cares for spouse and vice versa. As of 2023 married for 55 years per pt   Social Drivers of Corporate investment banker Strain: Low Risk  (05/27/2023)   Overall Financial Resource Strain (CARDIA)    Difficulty of Paying Living Expenses: Not hard at all  Food Insecurity: Food Insecurity Present (05/27/2023)   Hunger Vital Sign    Worried About Running Out of Food in the Last Year: Never true    Ran Out of Food in the Last Year: Sometimes true  Transportation Needs: No Transportation Needs (05/27/2023)   PRAPARE - Administrator, Civil Service (Medical): No    Lack of Transportation (Non-Medical): No  Physical Activity:  Insufficiently Active (05/27/2023)   Exercise Vital Sign    Days of Exercise per Week: 3 days    Minutes of Exercise per Session: 30 min  Stress: No Stress Concern Present (05/27/2023)   [employer] of Occupational Health - Occupational Stress Questionnaire    Feeling of Stress : Not at all  Social Connections:  Moderately Integrated (05/27/2023)   Social Connection and Isolation Panel    Frequency of Communication with Friends and Family: More than three times a week    Frequency of Social Gatherings with Friends and Family: Three times a week    Attends Religious Services: 1 to 4 times per year    Active Member of Clubs or Organizations: No    Attends Banker Meetings: Never    Marital Status: Married  Catering manager Violence: Not At Risk (05/27/2023)   Humiliation, Afraid, Rape, and Kick questionnaire    Fear of Current or Ex-Partner: No    Emotionally Abused: No    Physically Abused: No    Sexually Abused: No   Family History  Problem Relation Age of Onset   Coronary artery disease Mother 34   Heart disease Mother    Diabetes Mother    Colon cancer Father 34   Prostate cancer Father    Diabetes Father    Coronary artery disease Sister 67   Diabetes Sister    Heart disease Sister    Diabetes Sister    Diabetes Sister    Diabetes Sister    Diabetes Sister    Diabetes Sister    Diabetes Maternal Grandmother    Esophageal cancer Neg Hx    Stomach cancer Neg Hx    Rectal cancer Neg Hx     Objective:   Physical Examination:  General: Awake, alert, nontoxic female, No acute distress HEENT: Sclera white.  Moist mucous membranes Pulm: Normal work of breathing on room air MSK: Resting in a chair.  She has some soft tissue swelling at about the L1 level but no gross warmth, induration or tenderness to palpation.  No erythema or bruising appreciated at this level Psych: Somewhat tearful when talking about her sister, who recently died  Assessment/ Plan: 78 y.o. female   Compression fracture of L1 vertebra with routine healing, subsequent encounter - Plan: Ambulatory referral to Home Health, Amb Referral to Osteoporosis Management   Age-related osteoporosis with current pathological fracture, initial encounter - Plan: Ambulatory referral to Home Health, Amb Referral to  Osteoporosis Management   Impaired mobility and endurance - Plan: Ambulatory referral to Home Health  Polyarthralgia - Plan: Ambulatory referral to Home Health   I reviewed the ER notes and imaging studies.  Referral for home health for ADLs and PT.  I have also placed a referral to osteoporosis management.  I think this patient would likely be a good candidate for either Evenity or Prolia but given low vitamin D  in the setting of CKD, I am not sure if we can safely replete this without exacerbating other electrolyte and/or vitamin abnormalities.  Her calcium  level is appropriate and actually considered treatment with Miacalcin but her pain was not severe and again vitamin D  is not at therapeutic levels.  She seems to be doing well on Tylenol  alone so she may continue this.  She still has plenty of tablets of Norco leftover.  We discussed bowel regimen should she use them.  We have appointment in 2 weeks and she may contact me prior to that time if needed  Norene CHRISTELLA [name], DO [facility] [phone]

## 2024-02-22 ENCOUNTER — Observation Stay (HOSPITAL_COMMUNITY)
Admission: EM | Admit: 2024-02-22 | Discharge: 2024-02-24 | Disposition: A | Discharge status: Skilled Nursing Facility | Attending: Family Medicine | Admitting: Family Medicine

## 2024-02-22 ENCOUNTER — Encounter (HOSPITAL_COMMUNITY): Payer: Self-pay | Admitting: Family Medicine

## 2024-02-22 ENCOUNTER — Other Ambulatory Visit: Payer: Self-pay

## 2024-02-22 ENCOUNTER — Emergency Department (HOSPITAL_COMMUNITY)

## 2024-02-22 DIAGNOSIS — D3502 Benign neoplasm of left adrenal gland: Secondary | ICD-10-CM | POA: Diagnosis not present

## 2024-02-22 DIAGNOSIS — K5750 Diverticulosis of both small and large intestine without perforation or abscess without bleeding: Secondary | ICD-10-CM | POA: Diagnosis not present

## 2024-02-22 DIAGNOSIS — E039 Hypothyroidism, unspecified: Secondary | ICD-10-CM | POA: Diagnosis not present

## 2024-02-22 DIAGNOSIS — R296 Repeated falls: Secondary | ICD-10-CM | POA: Insufficient documentation

## 2024-02-22 DIAGNOSIS — E875 Hyperkalemia: Secondary | ICD-10-CM | POA: Insufficient documentation

## 2024-02-22 DIAGNOSIS — E785 Hyperlipidemia, unspecified: Secondary | ICD-10-CM | POA: Diagnosis not present

## 2024-02-22 DIAGNOSIS — S32010A Wedge compression fracture of first lumbar vertebra, initial encounter for closed fracture: Secondary | ICD-10-CM | POA: Diagnosis not present

## 2024-02-22 DIAGNOSIS — I129 Hypertensive chronic kidney disease with stage 1 through stage 4 chronic kidney disease, or unspecified chronic kidney disease: Secondary | ICD-10-CM | POA: Insufficient documentation

## 2024-02-22 DIAGNOSIS — N390 Urinary tract infection, site not specified: Secondary | ICD-10-CM | POA: Diagnosis not present

## 2024-02-22 DIAGNOSIS — I152 Hypertension secondary to endocrine disorders: Secondary | ICD-10-CM

## 2024-02-22 DIAGNOSIS — Z794 Long term (current) use of insulin: Secondary | ICD-10-CM | POA: Diagnosis not present

## 2024-02-22 DIAGNOSIS — E1169 Type 2 diabetes mellitus with other specified complication: Secondary | ICD-10-CM

## 2024-02-22 DIAGNOSIS — I10 Essential (primary) hypertension: Secondary | ICD-10-CM | POA: Diagnosis not present

## 2024-02-22 DIAGNOSIS — S32009A Unspecified fracture of unspecified lumbar vertebra, initial encounter for closed fracture: Secondary | ICD-10-CM | POA: Diagnosis not present

## 2024-02-22 DIAGNOSIS — B9620 Unspecified Escherichia coli [E. coli] as the cause of diseases classified elsewhere: Secondary | ICD-10-CM | POA: Insufficient documentation

## 2024-02-22 DIAGNOSIS — E1143 Type 2 diabetes mellitus with diabetic autonomic (poly)neuropathy: Secondary | ICD-10-CM | POA: Diagnosis not present

## 2024-02-22 DIAGNOSIS — R197 Diarrhea, unspecified: Secondary | ICD-10-CM | POA: Insufficient documentation

## 2024-02-22 DIAGNOSIS — M549 Dorsalgia, unspecified: Secondary | ICD-10-CM | POA: Diagnosis not present

## 2024-02-22 DIAGNOSIS — I2510 Atherosclerotic heart disease of native coronary artery without angina pectoris: Secondary | ICD-10-CM | POA: Insufficient documentation

## 2024-02-22 DIAGNOSIS — M545 Low back pain, unspecified: Secondary | ICD-10-CM | POA: Diagnosis not present

## 2024-02-22 DIAGNOSIS — S32000A Wedge compression fracture of unspecified lumbar vertebra, initial encounter for closed fracture: Principal | ICD-10-CM

## 2024-02-22 DIAGNOSIS — E1122 Type 2 diabetes mellitus with diabetic chronic kidney disease: Secondary | ICD-10-CM | POA: Insufficient documentation

## 2024-02-22 DIAGNOSIS — M4856XA Collapsed vertebra, not elsewhere classified, lumbar region, initial encounter for fracture: Secondary | ICD-10-CM | POA: Diagnosis not present

## 2024-02-22 DIAGNOSIS — W19XXXA Unspecified fall, initial encounter: Secondary | ICD-10-CM | POA: Diagnosis not present

## 2024-02-22 DIAGNOSIS — N184 Chronic kidney disease, stage 4 (severe): Secondary | ICD-10-CM | POA: Diagnosis not present

## 2024-02-22 DIAGNOSIS — M48061 Spinal stenosis, lumbar region without neurogenic claudication: Secondary | ICD-10-CM | POA: Diagnosis not present

## 2024-02-22 DIAGNOSIS — E11319 Type 2 diabetes mellitus with unspecified diabetic retinopathy without macular edema: Secondary | ICD-10-CM | POA: Insufficient documentation

## 2024-02-22 DIAGNOSIS — N183 Chronic kidney disease, stage 3 unspecified: Secondary | ICD-10-CM

## 2024-02-22 DIAGNOSIS — D631 Anemia in chronic kidney disease: Secondary | ICD-10-CM | POA: Diagnosis not present

## 2024-02-22 DIAGNOSIS — L089 Local infection of the skin and subcutaneous tissue, unspecified: Secondary | ICD-10-CM

## 2024-02-22 LAB — URINALYSIS, ROUTINE W REFLEX MICROSCOPIC
Bilirubin Urine: NEGATIVE
Glucose, UA: NEGATIVE mg/dL
Ketones, ur: NEGATIVE mg/dL
Nitrite: NEGATIVE
Protein, ur: 100 mg/dL — AB
Specific Gravity, Urine: 1.009 (ref 1.005–1.030)
pH: 6 (ref 5.0–8.0)

## 2024-02-22 LAB — GLUCOSE, CAPILLARY: Glucose-Capillary: 74 mg/dL (ref 70–99)

## 2024-02-22 LAB — COMPREHENSIVE METABOLIC PANEL WITH GFR
ALT: <5 U/L (ref 0–44)
AST: 15 U/L (ref 15–41)
Albumin: 4.1 g/dL (ref 3.5–5.0)
Alkaline Phosphatase: 153 U/L — ABNORMAL HIGH (ref 38–126)
Anion gap: 13 (ref 5–15)
BUN: 30 mg/dL — ABNORMAL HIGH (ref 8–23)
CO2: 21 mmol/L — ABNORMAL LOW (ref 22–32)
Calcium: 9.3 mg/dL (ref 8.9–10.3)
Chloride: 101 mmol/L (ref 98–111)
Creatinine, Ser: 1.97 mg/dL — ABNORMAL HIGH (ref 0.44–1.00)
GFR, Estimated: 25 mL/min — ABNORMAL LOW (ref >60)
Glucose, Bld: 120 mg/dL — ABNORMAL HIGH (ref 70–99)
Potassium: 5 mmol/L (ref 3.5–5.1)
Sodium: 135 mmol/L (ref 135–145)
Total Bilirubin: 0.5 mg/dL (ref 0.0–1.2)
Total Protein: 7.1 g/dL (ref 6.5–8.1)

## 2024-02-22 LAB — CBG MONITORING, ED
Glucose-Capillary: 116 mg/dL — ABNORMAL HIGH (ref 70–99)
Glucose-Capillary: 115 mg/dL — ABNORMAL HIGH (ref 70–99)
Glucose-Capillary: 80 mg/dL (ref 70–99)

## 2024-02-22 LAB — CBC WITH DIFFERENTIAL/PLATELET
Abs Immature Granulocytes: 0.04 K/uL (ref 0.00–0.07)
Basophils Absolute: 0.1 K/uL (ref 0.0–0.1)
Basophils Relative: 1 %
Eosinophils Absolute: 0.3 K/uL (ref 0.0–0.5)
Eosinophils Relative: 4 %
HCT: 34.2 % — ABNORMAL LOW (ref 36.0–46.0)
Hemoglobin: 11.5 g/dL — ABNORMAL LOW (ref 12.0–15.0)
Immature Granulocytes: 0 %
Lymphocytes Relative: 20 %
Lymphs Abs: 1.8 K/uL (ref 0.7–4.0)
MCH: 30.7 pg (ref 26.0–34.0)
MCHC: 33.6 g/dL (ref 30.0–36.0)
MCV: 91.2 fL (ref 80.0–100.0)
Monocytes Absolute: 0.6 K/uL (ref 0.1–1.0)
Monocytes Relative: 6 %
Neutro Abs: 6.3 K/uL (ref 1.7–7.7)
Neutrophils Relative %: 69 %
Platelets: 183 K/uL (ref 150–400)
RBC: 3.75 MIL/uL — ABNORMAL LOW (ref 3.87–5.11)
RDW: 11.9 % (ref 11.5–15.5)
WBC: 9 K/uL (ref 4.0–10.5)
nRBC: 0 % (ref 0.0–0.2)

## 2024-02-22 LAB — HEMOGLOBIN A1C
Hgb A1c MFr Bld: 6.9 % — ABNORMAL HIGH (ref 4.8–5.6)
Mean Plasma Glucose: 151.33 mg/dL

## 2024-02-22 MED ORDER — HYDROMORPHONE HCL 1 MG/ML IJ SOLN
0.5000 mg | INTRAMUSCULAR | Status: DC | PRN
Start: 2024-02-22 — End: 2024-02-24
  Administered 2024-02-22 (×2): 0.5 mg via INTRAVENOUS
  Filled 2024-02-22 (×2): qty 0.5

## 2024-02-22 MED ORDER — ACETAMINOPHEN 325 MG PO TABS
650.0000 mg | ORAL_TABLET | Freq: Four times a day (QID) | ORAL | Status: DC | PRN
  Administered 2024-02-23 (×2): 650 mg via ORAL
  Filled 2024-02-22 (×2): qty 2

## 2024-02-22 MED ORDER — SODIUM CHLORIDE 0.9% FLUSH: 3.0000 mL | INTRAVENOUS | Status: DC | PRN

## 2024-02-22 MED ORDER — CEPHALEXIN 500 MG PO CAPS
500.0000 mg | ORAL_CAPSULE | Freq: Three times a day (TID) | ORAL | Status: DC
  Administered 2024-02-23 – 2024-02-24 (×4): 500 mg via ORAL
  Filled 2024-02-22 (×4): qty 1

## 2024-02-22 MED ORDER — LOSARTAN POTASSIUM 50 MG PO TABS
50.0000 mg | ORAL_TABLET | Freq: Every day | ORAL | Status: DC
  Administered 2024-02-22 – 2024-02-24 (×3): 50 mg via ORAL
  Filled 2024-02-22: qty 2
  Filled 2024-02-22 (×2): qty 1

## 2024-02-22 MED ORDER — AMLODIPINE BESYLATE 5 MG PO TABS
5.0000 mg | ORAL_TABLET | Freq: Every day | ORAL | Status: DC
  Administered 2024-02-22 – 2024-02-24 (×3): 5 mg via ORAL
  Filled 2024-02-22 (×3): qty 1

## 2024-02-22 MED ORDER — HYDROCODONE-ACETAMINOPHEN 5-325 MG PO TABS
1.0000 | ORAL_TABLET | ORAL | Status: DC | PRN
  Administered 2024-02-22 – 2024-02-24 (×5): 1 via ORAL
  Filled 2024-02-22 (×5): qty 1

## 2024-02-22 MED ORDER — INSULIN ASPART 100 UNIT/ML IJ SOLN
3.0000 [IU] | Freq: Three times a day (TID) | INTRAMUSCULAR | Status: DC
  Administered 2024-02-22 – 2024-02-23 (×4): 3 [IU] via SUBCUTANEOUS
  Filled 2024-02-22 (×2): qty 1

## 2024-02-22 MED ORDER — ONDANSETRON HCL 4 MG PO TABS: 4.0000 mg | ORAL_TABLET | Freq: Four times a day (QID) | ORAL | Status: DC | PRN

## 2024-02-22 MED ORDER — TRAZODONE HCL 50 MG PO TABS: 50.0000 mg | ORAL_TABLET | Freq: Every evening | ORAL | Status: DC | PRN

## 2024-02-22 MED ORDER — LEVOTHYROXINE SODIUM 50 MCG PO TABS
50.0000 ug | ORAL_TABLET | Freq: Every day | ORAL | Status: DC
  Administered 2024-02-23 – 2024-02-24 (×2): 50 ug via ORAL
  Filled 2024-02-22 (×2): qty 1

## 2024-02-22 MED ORDER — ATORVASTATIN CALCIUM 10 MG PO TABS
10.0000 mg | ORAL_TABLET | Freq: Every day | ORAL | Status: DC
  Administered 2024-02-22 – 2024-02-24 (×3): 10 mg via ORAL
  Filled 2024-02-22 (×3): qty 1

## 2024-02-22 MED ORDER — BUSPIRONE HCL 5 MG PO TABS
5.0000 mg | ORAL_TABLET | Freq: Two times a day (BID) | ORAL | Status: DC
Start: 2024-02-22 — End: 2024-02-24
  Administered 2024-02-22 – 2024-02-24 (×5): 5 mg via ORAL
  Filled 2024-02-22 (×5): qty 1

## 2024-02-22 MED ORDER — FLUCONAZOLE 100 MG PO TABS
200.0000 mg | ORAL_TABLET | Freq: Once | ORAL | Status: AC
  Administered 2024-02-22: 200 mg via ORAL
  Filled 2024-02-22: qty 2

## 2024-02-22 MED ORDER — ONDANSETRON HCL 4 MG/2ML IJ SOLN: 4.0000 mg | Freq: Four times a day (QID) | INTRAMUSCULAR | Status: DC | PRN

## 2024-02-22 MED ORDER — SODIUM CHLORIDE 0.9% FLUSH
3.0000 mL | Freq: Two times a day (BID) | INTRAVENOUS | Status: DC
  Administered 2024-02-22 – 2024-02-24 (×4): 3 mL via INTRAVENOUS

## 2024-02-22 MED ORDER — METHOCARBAMOL 500 MG PO TABS
500.0000 mg | ORAL_TABLET | Freq: Three times a day (TID) | ORAL | Status: DC
Start: 2024-02-22 — End: 2024-02-24
  Administered 2024-02-22 – 2024-02-24 (×5): 500 mg via ORAL
  Filled 2024-02-22 (×5): qty 1

## 2024-02-22 MED ORDER — BISACODYL 10 MG RE SUPP: 10.0000 mg | Freq: Every day | RECTAL | Status: DC | PRN

## 2024-02-22 MED ORDER — HEPARIN SODIUM (PORCINE) 5000 UNIT/ML IJ SOLN
5000.0000 [IU] | Freq: Three times a day (TID) | INTRAMUSCULAR | Status: DC
Start: 2024-02-22 — End: 2024-02-24
  Administered 2024-02-22 – 2024-02-24 (×5): 5000 [IU] via SUBCUTANEOUS
  Filled 2024-02-22 (×5): qty 1

## 2024-02-22 MED ORDER — POLYETHYLENE GLYCOL 3350 17 G PO PACK: 17.0000 g | PACK | Freq: Every day | ORAL | Status: DC | PRN

## 2024-02-22 MED ORDER — INSULIN ASPART 100 UNIT/ML IJ SOLN: 0.0000 [IU] | Freq: Every day | INTRAMUSCULAR | Status: DC

## 2024-02-22 MED ORDER — ONDANSETRON HCL 4 MG/2ML IJ SOLN
4.0000 mg | Freq: Once | INTRAMUSCULAR | Status: AC
  Administered 2024-02-22: 4 mg via INTRAVENOUS
  Filled 2024-02-22: qty 2

## 2024-02-22 MED ORDER — SODIUM CHLORIDE 0.9 % IV BOLUS
500.0000 mL | Freq: Once | INTRAVENOUS | Status: AC
  Administered 2024-02-22: 500 mL via INTRAVENOUS

## 2024-02-22 MED ORDER — SODIUM CHLORIDE 0.9 % IV SOLN: INTRAVENOUS | Status: AC | PRN

## 2024-02-22 MED ORDER — INSULIN ASPART 100 UNIT/ML IJ SOLN
0.0000 [IU] | Freq: Three times a day (TID) | INTRAMUSCULAR | Status: DC
  Administered 2024-02-23: 2 [IU] via SUBCUTANEOUS
  Administered 2024-02-23: 3 [IU] via SUBCUTANEOUS

## 2024-02-22 MED ORDER — ACETAMINOPHEN 650 MG RE SUPP: 650.0000 mg | Freq: Four times a day (QID) | RECTAL | Status: DC | PRN

## 2024-02-22 MED ORDER — CARVEDILOL 3.125 MG PO TABS
6.2500 mg | ORAL_TABLET | Freq: Two times a day (BID) | ORAL | Status: DC
  Administered 2024-02-22 – 2024-02-24 (×5): 6.25 mg via ORAL
  Filled 2024-02-22 (×5): qty 2

## 2024-02-22 MED ORDER — SODIUM CHLORIDE 0.9 % IV SOLN
2.0000 g | Freq: Once | INTRAVENOUS | Status: AC
  Administered 2024-02-22: 2 g via INTRAVENOUS
  Filled 2024-02-22: qty 20

## 2024-02-22 MED ORDER — SODIUM CHLORIDE 0.9% FLUSH
3.0000 mL | Freq: Two times a day (BID) | INTRAVENOUS | Status: DC
  Administered 2024-02-22 – 2024-02-24 (×5): 3 mL via INTRAVENOUS

## 2024-02-22 MED ORDER — FENTANYL CITRATE (PF) 100 MCG/2ML IJ SOLN
50.0000 ug | Freq: Once | INTRAMUSCULAR | Status: AC
  Administered 2024-02-22: 50 ug via INTRAVENOUS
  Filled 2024-02-22: qty 2

## 2024-02-22 NOTE — ED Provider Notes (Signed)
 [place] EMERGENCY DEPARTMENT AT [facility]  Provider Note  CSN: [id number] Arrival date & time: 02/22/24 0447  History Chief Complaint  Patient presents with  . Back Pain  . Diarrhea    [name] is a 78 y.o. female recently had a fall at home and found to have a lumbar compression fracture on xray. She was given some pain medication which helped some but she has run out and pain not well controlled with APAP. She has had difficulty functioning at home, husband is not able to fully care for her. She has also had several episodes of diarrhea, no vomiting or dysuria. Minimal abdominal discomfort. No bleeding.    Home Medications Prior to Admission medications   Medication Sig Start Date End Date Taking? Authorizing Provider  Accu-Chek Softclix Lancets lancets Test BS daily as needed Dx E11.22 11/22/23   Jolinda Potter M, DO  amLODipine  (NORVASC ) 5 MG tablet Take 1 tablet (5 mg total) by mouth daily. 07/18/23   Jolinda Potter HERO, DO  atorvastatin  (LIPITOR) 10 MG tablet Take 1 tablet (10 mg total) by mouth daily. 07/18/23   Jolinda Potter HERO, DO  azelastine  (ASTELIN ) 0.1 % nasal spray Place 1 spray into both nostrils 2 (two) times daily. For drainage 10/24/23   Jolinda Potter HERO, DO  BOOSTRIX  5-2.5-18.5 LF-MCG/0.5 injection  03/03/23   [provider]  busPIRone  (BUSPAR ) 5 MG tablet Take 0.5-1 tablets (2.5-5 mg total) by mouth 2 (two) times daily as needed. 11/26/22   Lavell Lye A, FNP  carvedilol  (COREG ) 12.5 MG tablet TAKE 1 TABLET 2 TIMES A DAY WITH MEALS **NEEDS TO BE SEEN BEFORE NEXT REFILL** 01/16/24   Jolinda Potter M, DO  cholecalciferol  (VITAMIN D3) 25 MCG (1000 UNIT) tablet Take 1 tablet (1,000 Units total) by mouth daily. 03/04/23   Jolinda Potter HERO, DO  colchicine  0.6 MG tablet Take 0.6 mg by mouth daily as needed.    [provider]  Continuous Glucose Receiver (FREESTYLE LIBRE 2 READER) DEVI Use to monitor glucose continuously as  directed. 07/25/23   Nida, Gebreselassie W, MD  Continuous Glucose Sensor (FREESTYLE LIBRE 2 SENSOR) MISC Use to monitor glucose continuously as directed. 08/29/23   Nida, Gebreselassie W, MD  dapagliflozin  propanediol (FARXIGA ) 5 MG TABS tablet Take 1 tablet (5 mg total) by mouth daily. 11/22/23   Nida, Gebreselassie W, MD  Febuxostat  (ULORIC ) 80 MG TABS 1 tablet Orally Once a day to manage gout for 30 day(s) 06/28/18   [provider]  Febuxostat  80 MG TABS Take by mouth.    [provider]  fluticasone  (FLONASE ) 50 MCG/ACT nasal spray Place 2 sprays into both nostrils daily. Use 2 sprays in each nostril daily 04/01/21   Jolinda Potter M, DO  furosemide  (LASIX ) 40 MG tablet Take 1 tablet twice weekly 07/18/23   Gottschalk, Ashly M, DO  glucose blood (ACCU-CHEK AVIVA PLUS) test strip Test BS daily as needed Dx E11.22 11/22/23   Jolinda Potter HERO, DO  HYDROcodone -acetaminophen  (NORCO/VICODIN) 5-325 MG tablet Take 1 tablet by mouth every 8 (eight) hours as needed. 02/14/24   Suellen Cantor A, PA-C  Insulin  Glargine (BASAGLAR  KWIKPEN) 100 UNIT/ML Inject 6 Units into the skin daily with breakfast. 08/29/23   Nida, Ethelle ORN, MD  Lancets Misc. MISC Use as directed to check blood sugar up to 4x daily. E11.65 02/27/19   Jolinda Potter HERO, DO  levothyroxine  (SYNTHROID ) 50 MCG tablet Take 1 tablet (50 mcg total) by mouth daily  before breakfast. 06/01/23   Lenis Ethelle ORN, MD  loratadine  (CLARITIN ) 10 MG tablet Take 1 tablet (10 mg total) by mouth every other day as needed for allergies (drainage/ sneezing). 10/24/23   Jolinda Norene HERO, DO  losartan  (COZAAR ) 100 MG tablet 1 tablet Orally Once a day    [provider]  meclizine  (ANTIVERT ) 25 MG tablet Take 0.5-1 tablets (12.5-25 mg total) by mouth 3 (three) times daily as needed for dizziness. 07/18/23   Jolinda Norene HERO, DO  mupirocin ointment (BACTROBAN) 2 % Apply topically as directed. 10/13/21   [provider]   ondansetron  (ZOFRAN -ODT) 4 MG disintegrating tablet Take 1 tablet (4 mg total) by mouth every 6 (six) hours as needed for nausea or vomiting. 02/14/24   Suellen Cantor A, PA-C  pantoprazole  (PROTONIX ) 20 MG tablet Take 1 tablet (20 mg total) by mouth daily. 07/18/23   Jolinda Norene HERO, DO  prednisoLONE  acetate (PRED FORTE ) 1 % ophthalmic suspension Place 1 drop into the right eye 4 (four) times daily. 10/17/19   Rankin, Arley LABOR, MD  silver  sulfADIAZINE  (SILVADENE ) 1 % cream Apply topically as directed. 01/12/22   [provider]  DANYA MICRO PEN NEEDLES 32G X 4 MM MISC TWICE DAILY 10/26/18   Nida, Gebreselassie W, MD     Allergies    Aspirin, Ciprofloxacin, Codeine, Hydrocodone -acetaminophen , Losartan , Micardis [telmisartan], Nexium [esomeprazole magnesium ], Niaspan [niacin er (antihyperlipidemic)], Nsaids, Onglyza [saxagliptin ], Other, Oxycodone  hcl, Rofecoxib, Simvastatin, Tequin [gatifloxacin], Welchol [colesevelam hcl], and Amoxicillin   Review of Systems   Review of Systems Please see HPI for pertinent positives and negatives  Physical Exam BP (!) 141/61   Pulse (!) 56   Temp 98 F (36.7 C) (Oral)   Resp 16   Ht 5' 1 (1.549 m)   Wt 66.7 kg   SpO2 93%   BMI 27.78 kg/m   Physical Exam Vitals and nursing note reviewed.  Constitutional:      Appearance: Normal appearance.  HENT:     Head: Normocephalic and atraumatic.     Nose: Nose normal.     Mouth/Throat:     Mouth: Mucous membranes are moist.  Eyes:     Extraocular Movements: Extraocular movements intact.     Conjunctiva/sclera: Conjunctivae normal.  Cardiovascular:     Rate and Rhythm: Normal rate.  Pulmonary:     Effort: Pulmonary effort is normal.     Breath sounds: Normal breath sounds.  Abdominal:     General: Abdomen is flat.     Palpations: Abdomen is soft.     Tenderness: There is no abdominal tenderness. There is no guarding.  Musculoskeletal:        General: No swelling. Normal range of  motion.     Cervical back: Neck supple.  Skin:    General: Skin is warm and dry.  Neurological:     General: No focal deficit present.     Mental Status: She is alert and oriented to person, place, and time.     Motor: No weakness.  Psychiatric:        Mood and Affect: Mood normal.     ED Results / Procedures / Treatments   EKG None  Procedures Procedures  Medications Ordered in the ED Medications  fentaNYL  (SUBLIMAZE ) injection 50 mcg (50 mcg Intravenous Given 02/22/24 0524)  ondansetron  (ZOFRAN ) injection 4 mg (4 mg Intravenous Given 02/22/24 0522)  sodium chloride  0.9 % bolus 500 mL (500 mLs Intravenous New Bag/Given 02/22/24 0521)  Initial Impression and Plan  Patient here via EMS for persistent back pain with recent compression fracture and some diarrhea. Will check labs, send for CT to eval diarrhea as well as fracture. Pain/nausea meds and IVF for comfort.  ED Course   Clinical Course as of 02/22/24 0700  Wed Feb 22, 2024  0541 CBC is unremarkable.  [CS]  9384 CMP with CKD about at baseline.  [CS]  9347 I personally viewed the images from radiology studies and agree with radiologist interpretation: CT abd/pel without acute findings.  [CS]  [phone] Care signed out to oncoming team at shift change.  [CS]    Clinical Course User Index [CS] Roselyn Carlin NOVAK, MD     MDM Rules/Calculators/A&P Medical Decision Making Problems Addressed: Compression fracture of lumbar vertebra, unspecified lumbar vertebral level, initial encounter [facility]): acute illness or injury Diarrhea, unspecified type: acute illness or injury  Amount and/or Complexity of Data Reviewed Labs: ordered. Decision-making details documented in ED Course. Radiology: ordered and independent interpretation performed. Decision-making details documented in ED Course.  Risk Prescription drug management. Parenteral controlled substances.     Final Clinical Impression(s) / ED Diagnoses Final diagnoses:   Compression fracture of lumbar vertebra, unspecified lumbar vertebral level, initial encounter (HCC)  Diarrhea, unspecified type    Rx / DC Orders ED Discharge Orders     None        Roselyn Carlin NOVAK, MD 02/22/24 [phone]

## 2024-02-22 NOTE — Evaluation (Signed)
 Occupational Therapy Evaluation Patient Details Name: [name] MRN: [id number] DOB: [birth year] Today's Date: 02/22/2024   History of Present Illness   [name] is a 78 y.o. female recently had a fall at home and found to have a lumbar compression fracture on xray. She was given some pain medication which helped some but she has run out and pain not well controlled with APAP. She has had difficulty functioning at home, husband is not able to fully care for her. She has also had several episodes of diarrhea, no vomiting or dysuria. Minimal abdominal discomfort. No bleeding. (per MD)     Clinical Impressions Pt agreeable to OT and PT co-evaluation. Pt had a recent lumbar fracture and reports fear of falling at home. Pt uses RW or quad cane at baseline. Today pt required mod to max A for bed mobility and mod A with RW for transfer to chair from EOB. B UE limited slightly in shoulder A/ROM with general weakness. Pt unable to complete lower body dressing at this time. Reports husband assist with this at home, but husband is limited due to illness of his own. Pt left on the [facility] with call bell within reach and NT notified of pt needs. Pt will benefit from continued OT in the hospital to increase strength, balance, and endurance for safe ADL's.        If plan is discharge home, recommend the following:   A lot of help with walking and/or transfers;A lot of help with bathing/dressing/bathroom;Assistance with cooking/housework;Assist for transportation;Help with stairs or ramp for entrance     Functional Status Assessment   Patient has had a recent decline in their functional status and demonstrates the ability to make significant improvements in function in a reasonable and predictable amount of time.     Equipment Recommendations   None recommended by OT             Precautions/Restrictions   Precautions Precautions: Fall Recall of Precautions/Restrictions:  Intact Restrictions Weight Bearing Restrictions Per Provider Order: No     Mobility Bed Mobility Overal bed mobility: Needs Assistance Bed Mobility: Supine to Sit     Supine to sit: Mod assist, HOB elevated, Max assist     General bed mobility comments: labored; assist to bring B LE to EOB and lift torso to upright position.    Transfers Overall transfer level: Needs assistance Equipment used: Rolling walker (2 wheels) Transfers: Sit to/from Stand, Bed to chair/wheelchair/BSC Sit to Stand: Mod assist     Step pivot transfers: Mod assist     General transfer comment: EOB to chair with RW; unsteady; labored      Balance Overall balance assessment: Needs assistance Sitting-balance support: Feet supported, Bilateral upper extremity supported Sitting balance-Leahy Scale: Fair Sitting balance - Comments: seated at EOB   Standing balance support: Bilateral upper extremity supported, During functional activity, Reliant on assistive device for balance Standing balance-Leahy Scale: Poor Standing balance comment: poor to fair with RW                           ADL either performed or assessed with clinical judgement   ADL Overall ADL's : Needs assistance/impaired     Grooming: Set up;Sitting   Upper Body Bathing: Set up;Sitting;Contact guard assist   Lower Body Bathing: Maximal assistance;Sitting/lateral leans   Upper Body Dressing : Set up;Contact guard assist;Sitting   Lower Body Dressing: Maximal assistance;Sitting/lateral leans   Toilet Transfer:  Moderate assistance;Stand-pivot;Rolling walker (2 wheels);BSC/3in1 Toilet Transfer Details (indicate cue type and reason): chair to [facility] with RW Toileting- Clothing Manipulation and Hygiene: Moderate assistance;Maximal assistance;Sitting/lateral lean       Functional mobility during ADLs: Moderate assistance;Rolling walker (2 wheels) General ADL Comments: Pt only taking one small step forward and back ward  from the chair before reporting need to sit and inability to go futher.     Vision Baseline Vision/History:  (Pt reports retina issues at baseline that cause very poor vision at baseline.) Ability to See in Adequate Light: 3 Highly impaired Patient Visual Report: No change from baseline Vision Assessment?:  (highly impaired at baseline.)     Perception Perception: Not tested       Praxis Praxis: Not tested       Pertinent Vitals/Pain Pain Assessment Pain Assessment: Faces Faces Pain Scale: Hurts even more Pain Location: back Pain Descriptors / Indicators: Discomfort, Grimacing, Guarding Pain Intervention(s): Limited activity within patient's tolerance, Monitored during session, Repositioned     Extremity/Trunk Assessment Upper Extremity Assessment Upper Extremity Assessment: Generalized weakness (Slightly less than full A/ROM and P/ROM bilaterally for shoulder flexion. Generally weak otherwise.)   Lower Extremity Assessment Lower Extremity Assessment: Defer to PT evaluation   Cervical / Trunk Assessment Cervical / Trunk Assessment: Kyphotic   Communication Communication Communication: No apparent difficulties   Cognition Arousal: Alert Behavior During Therapy: WFL for tasks assessed/performed Cognition: No apparent impairments                               Following commands: Intact       Cueing  General Comments   Cueing Techniques: Verbal cues;Tactile cues                 Home Living Family/patient expects to be discharged to:: Private residence Living Arrangements: Spouse/significant other Available Help at Discharge: Family;Available 24 hours/day (Huband has leukemia and is limited in how much he can assist.) Type of Home: House Home Access: Stairs to enter [employer] of Steps: 7 Entrance Stairs-Rails: Right;Left;Can reach both Home Layout: One level     Bathroom Shower/Tub: Tub/shower unit;Sponge bathes at baseline    Bathroom Toilet: Handicapped height Bathroom Accessibility: No   Home Equipment: Agricultural consultant (2 wheels);Cane - quad;Grab bars - tub/shower;Wheelchair - manual          Prior Functioning/Environment Prior Level of Function : Needs assist       Physical Assist : ADLs (physical)   ADLs (physical): IADLs;Dressing;Bathing Mobility Comments: Ambualtes with use of quad cane and RW. Pt uses the RW with assist if ambulating outside. ADLs Comments: Assist bathing, and lower body dressing/ADL tasks. Pt able to don a shirt, groom, and feed. IADL assist by spouse.    OT Problem List: Decreased strength;Decreased range of motion;Decreased activity tolerance;Impaired balance (sitting and/or standing);Impaired vision/perception;Pain   OT Treatment/Interventions: Self-care/ADL training;Therapeutic exercise;DME and/or AE instruction;Therapeutic activities;Patient/family education;Balance training;Visual/perceptual remediation/compensation      OT Goals(Current goals can be found in the care plan section)   Acute Rehab OT Goals Patient Stated Goal: Improve function. OT Goal Formulation: With patient Time For Goal Achievement: 03/07/24 Potential to Achieve Goals: Good   OT Frequency:  Min 3X/week    Co-evaluation PT/OT/SLP Co-Evaluation/Treatment: Yes Reason for Co-Treatment: To address functional/ADL transfers   OT goals addressed during session: ADL's and self-care      AM-PAC OT 6 Clicks Daily Activity     Outcome  Measure Help from another person eating meals?: None Help from another person taking care of personal grooming?: A Little Help from another person toileting, which includes using toliet, bedpan, or urinal?: A Lot Help from another person bathing (including washing, rinsing, drying)?: A Lot Help from another person to put on and taking off regular upper body clothing?: A Little Help from another person to put on and taking off regular lower body clothing?: A Lot 6  Click Score: 16   End of Session Equipment Utilized During Treatment: Rolling walker (2 wheels) Nurse Communication: Other (comment) (Communicated with NT as lsited above.)  Activity Tolerance: Patient tolerated treatment well Patient left: with call bell/phone within reach;Other (comment) (BSC with call bell within reach and NT notified pt was ready to be cleaned and assisted with donning depends.)  OT Visit Diagnosis: Unsteadiness on feet (R26.81);Other abnormalities of gait and mobility (R26.89);Muscle weakness (generalized) (M62.81);History of falling (Z91.81)                Time: 9094-9076 OT Time Calculation (min): 18 min Charges:  OT General Charges $OT Visit: 1 Visit OT Evaluation $OT Eval Low Complexity: 1 Low  Prather Failla OT, MOT  Jayson Person 02/22/2024, 10:03 AM

## 2024-02-22 NOTE — TOC Initial Note (Signed)
 Transition of Care [facility]) - Initial/Assessment Note    Patient Details  Name: [name] MRN: [id number] Date of Birth: 1945-07-16  [facility]) CM/SW Contact:    Sharlyne Stabs, RN Phone Number: 02/22/2024, 2:06 PM  Clinical Narrative:      Patient admitted with falls. Still in ED waiting for a bed. PT is recommending SNF.  CM at the bedside, Patient lives at home with her husband, she need assistance, but he has leukemia and is weak himself,  patient is agreeable to SNF. She is requesting [facility].  FL2 completed and sent out for bed offers. IPCM following.              Expected Discharge Plan: Skilled Nursing Facility Barriers to Discharge: Continued Medical Work up, SNF Pending bed offer   Patient Goals and CMS Choice Patient states their goals for this hospitalization and ongoing recovery are:: Agreeable to SNF CMS Medicare.gov Compare Post Acute Care list provided to:: Patient Choice offered to / list presented to : Patient     Expected Discharge Plan and Services      Living arrangements for the past 2 months: Single Family Home                    Prior Living Arrangements/Services Living arrangements for the past 2 months: Single Family Home Lives with:: Spouse Patient language and need for interpreter reviewed:: Yes        Need for Family Participation in Patient Care: Yes (Comment) Care giver support system in place?: Yes (comment) Current home services: DME Criminal Activity/Legal Involvement Pertinent to Current Situation/Hospitalization: No - Comment as needed   Permission Sought/Granted      Permission granted to share info w Relationship: Spouse    Emotional Assessment     Affect (typically observed): Accepting Orientation: : Oriented to Self, Oriented to Place, Oriented to  Time, Oriented to Situation Alcohol / Substance Use: Not Applicable Psych Involvement: No (comment)  Admission diagnosis:  Falls [R29.6] Patient Active Problem  List   Diagnosis Date Noted   Falls 02/22/2024   Insulin  long-term use (HCC) 10/21/2022   Iron deficiency anemia secondary to blood loss (chronic) 09/02/2022   CKD (chronic kidney disease) stage 3, GFR 30-59 ml/min (HCC) 04/28/2022   Diabetic ulcer of right great toe (HCC) 03/29/2022   Personal history of diabetic foot ulcer 09/29/2021   Lightheaded 08/21/2021   Idiopathic chronic gout, multiple sites, with tophus (tophi) 04/03/2021   Overweight 04/03/2021   Polyarthralgia 04/03/2021   Cystoid macular edema, right eye 01/14/2020   Type 2 diabetes mellitus with stage 3 chronic kidney disease, with long-term current use of insulin  (HCC) 10/02/2019   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 10/02/2019   Retinal hemorrhage of left eye 10/02/2019   Retinal microaneurysm of both eyes 10/02/2019   Vitamin D  deficiency 11/15/2017   Diabetic foot infection (HCC) 05/05/2016   Anemia in chronic renal disease    Gastroesophageal reflux disease without esophagitis 10/11/2014   Hypothyroidism 05/18/2014   Osteoporosis 09/06/2013   Mixed hyperlipidemia    Diabetic retinopathy (HCC) 10/28/2007   Hyperlipidemia associated with type 2 diabetes mellitus (HCC) 10/28/2007   Anxiety state 10/28/2007   Depression 10/28/2007   Essential hypertension 10/28/2007   COLONIC POLYPS, HYPERPLASTIC 11/17/2006   Diverticulosis of colon 11/17/2006   PCP:  Jolinda Norene HERO, DO Pharm[place]:   [facility] [place]  [address] [place] [address] Phone: [phone] Fax: [phone]     Social Drivers of Health (SDOH) Social History: SDOH Screenings   Food Insecurity: Food Insecurity Present (05/27/2023)  Housing: Unknown (05/27/2023)  Transportation Needs: No Transportation Needs (05/27/2023)  Utilities: Not At Risk (05/27/2023)  Alcohol Screen: Low Risk  (05/27/2023)  Depression (PHQ2-9): Low Risk   (10/24/2023)  Financial Resource Strain: Low Risk  (05/27/2023)  Physical Activity: Insufficiently Active (05/27/2023)  Social Connections: Moderately Integrated (05/27/2023)  Stress: No Stress Concern Present (05/27/2023)  Tobacco Use: Low Risk  (02/20/2024)  Health Literacy: Adequate Health Literacy (05/27/2023)   SDOH Interventions:

## 2024-02-22 NOTE — Plan of Care (Signed)
  Problem: Acute Rehab OT Goals (only OT should resolve) Goal: Pt. Will Perform Grooming Flowsheets (Taken 02/22/2024 1006) Pt Will Perform Grooming:  with contact guard assist  standing Goal: Pt. Will Perform Lower Body Bathing Flowsheets (Taken 02/22/2024 1006) Pt Will Perform Lower Body Bathing:  with supervision  with contact guard assist  with adaptive equipment  sitting/lateral leans Goal: Pt. Will Perform Lower Body Dressing Flowsheets (Taken 02/22/2024 1006) Pt Will Perform Lower Body Dressing:  with contact guard assist  with supervision  sitting/lateral leans  with adaptive equipment Goal: Pt. Will Transfer To Toilet Flowsheets (Taken 02/22/2024 1006) Pt Will Transfer to Toilet:  with supervision  ambulating Goal: Pt. Will Perform Toileting-Clothing Manipulation Flowsheets (Taken 02/22/2024 1006) Pt Will Perform Toileting - Clothing Manipulation and hygiene:  with set-up  sit to/from stand  sitting/lateral leans Goal: Pt/Caregiver Will Perform Home Exercise Program Flowsheets (Taken 02/22/2024 1006) Pt/caregiver will Perform Home Exercise Program:  Increased ROM  Increased strength  Both right and left upper extremity  Independently  Shakita Keir OT, MOT

## 2024-02-22 NOTE — TOC Progression Note (Signed)
 Transition of Care [facility]) - Progression Note    Patient Details  Name: GRAYCE BUDDEN MRN: [id number] Date of Birth: 1945-09-05  [facility]) CM/SW Contact  Sharlyne Stabs, RN Phone Number: 02/22/2024, 3:40 PM  Clinical Narrative:   CM called spouse with an update that [facility] offered a bed. Patient is wants [facility] if possible. CM texted Destiny, waiting for them to review the chart. Husband is tearful and upset he can not make the decision today, He rather wait to see if [facility] has a bed and will decide tomorrow.     Expected Discharge Plan: Skilled Nursing Facility Barriers to Discharge: Continued Medical Work up, SNF Pending bed offer    Expected Discharge Plan and Services       Living arrangements for the past 2 months: Single Family Home                    Social Drivers of Health (SDOH) Interventions SDOH Screenings   Food Insecurity: Food Insecurity Present (05/27/2023)  Housing: Unknown (05/27/2023)  Transportation Needs: No Transportation Needs (05/27/2023)  Utilities: Not At Risk (05/27/2023)  Alcohol Screen: Low Risk  (05/27/2023)  Depression (PHQ2-9): Low Risk  (10/24/2023)  Financial Resource Strain: Low Risk  (05/27/2023)  Physical Activity: Insufficiently Active (05/27/2023)  Social Connections: Moderately Integrated (05/27/2023)  Stress: No Stress Concern Present (05/27/2023)  Tobacco Use: Low Risk  (02/20/2024)  Health Literacy: Adequate Health Literacy (05/27/2023)    Readmission Risk Interventions     No data to display

## 2024-02-22 NOTE — Evaluation (Signed)
 Physical Therapy Evaluation Patient Details Name: [name] MRN: [id number] DOB: Mar 03, 1946 Today's Date: 02/22/2024  History of Present Illness  [name] is a 78 y.o. female recently had a fall at home and found to have a lumbar compression fracture on xray. She was given some pain medication which helped some but she has run out and pain not well controlled with APAP. She has had difficulty functioning at home, husband is not able to fully care for her. She has also had several episodes of diarrhea, no vomiting or dysuria. Minimal abdominal discomfort. No bleeding. (per MD)   Clinical Impression  Patient demonstrates slow labored movement for rolling to side and sitting up from side lying position with c/o increased low back pain, limited to a few steps at bedside before having to sit due to weakness and low back pain and tolerated sitting up on [facility] after therapy - nursing staff notified. Patient will benefit from continued skilled physical therapy in hospital and recommended venue below to increase strength, balance, endurance for safe ADLs and gait.       If plan is discharge home, recommend the following: A lot of help with walking and/or transfers;A lot of help with bathing/dressing/bathroom;Assistance with cooking/housework;Assist for transportation;Help with stairs or ramp for entrance   Can travel by private vehicle   No    Equipment Recommendations None recommended by PT  Recommendations for Other Services       Functional Status Assessment Patient has had a recent decline in their functional status and demonstrates the ability to make significant improvements in function in a reasonable and predictable amount of time.     Precautions / Restrictions Precautions Precautions: Fall Recall of Precautions/Restrictions: Intact Restrictions Weight Bearing Restrictions Per Provider Order: No      Mobility  Bed Mobility Overal bed mobility: Needs Assistance Bed  Mobility: Supine to Sit     Supine to sit: Mod assist, HOB elevated, Max assist     General bed mobility comments: fair return for rolling to side and sitting up from sidelying position due to increasing back pain    Transfers Overall transfer level: Needs assistance Equipment used: Rolling walker (2 wheels) Transfers: Sit to/from Stand, Bed to chair/wheelchair/BSC Sit to Stand: Mod assist   Step pivot transfers: Mod assist       General transfer comment: slow labord movement with c/o increasing back pain    Ambulation/Gait Ambulation/Gait assistance: Mod assist Gait Distance (Feet): 4 Feet Assistive device: Rolling walker (2 wheels) Gait Pattern/deviations: Decreased step length - left, Decreased stance time - right, Decreased stride length, Trunk flexed Gait velocity: slow     General Gait Details: limited to a few steps at bedside due to weakness and c/o increasing low back pain  Stairs            Wheelchair Mobility     Tilt Bed    Modified Rankin (Stroke Patients Only)       Balance Overall balance assessment: Needs assistance Sitting-balance support: Feet unsupported, No upper extremity supported Sitting balance-Leahy Scale: Fair Sitting balance - Comments: seated at EOB   Standing balance support: Reliant on assistive device for balance, During functional activity, Bilateral upper extremity supported Standing balance-Leahy Scale: Poor Standing balance comment: fair/poor using RW                             Pertinent Vitals/Pain Pain Assessment Pain Assessment: Faces Faces Pain Scale:  Hurts even more Pain Location: back Pain Descriptors / Indicators: Discomfort, Grimacing, Guarding    Home Living Family/patient expects to be discharged to:: Private residence Living Arrangements: Spouse/significant other Available Help at Discharge: Family;Available 24 hours/day (Huband has leukemia and is limited in how much he can assist.) Type  of Home: House Home Access: Stairs to enter Entrance Stairs-Rails: Right;Left;Can reach both Entrance Stairs-Number of Steps: 7   Home Layout: One level Home Equipment: Agricultural consultant (2 wheels);Cane - quad;Grab bars - tub/shower;Wheelchair - manual      Prior Function Prior Level of Function : Needs assist       Physical Assist : ADLs (physical)   ADLs (physical): IADLs;Dressing;Bathing Mobility Comments: Ambualtes with use of quad cane and RW. Pt uses the RW with assist if ambulating outside. ADLs Comments: Assist bathing, and lower body dressing/ADL tasks. Pt able to don a shirt, groom, and feed. IADL assist by spouse.     Extremity/Trunk Assessment   Upper Extremity Assessment Upper Extremity Assessment: Defer to OT evaluation    Lower Extremity Assessment Lower Extremity Assessment: Generalized weakness    Cervical / Trunk Assessment Cervical / Trunk Assessment: Kyphotic  Communication   Communication Communication: No apparent difficulties    Cognition Arousal: Alert Behavior During Therapy: WFL for tasks assessed/performed                             Following commands: Intact       Cueing Cueing Techniques: Verbal cues, Tactile cues     General Comments      Exercises     Assessment/Plan    PT Assessment Patient needs continued PT services  PT Problem List Decreased strength;Decreased activity tolerance;Decreased balance;Decreased mobility;Pain       PT Treatment Interventions DME instruction;Gait training;Stair training;Functional mobility training;Therapeutic activities;Therapeutic exercise;Balance training;Patient/family education    PT Goals (Current goals can be found in the Care Plan section)  Acute Rehab PT Goals Patient Stated Goal: return home after rehab PT Goal Formulation: With patient Time For Goal Achievement: 03/07/24 Potential to Achieve Goals: Good    Frequency Min 2X/week     Co-evaluation PT/OT/SLP  Co-Evaluation/Treatment: Yes Reason for Co-Treatment: To address functional/ADL transfers PT goals addressed during session: Mobility/safety with mobility;Balance;Proper use of DME         AM-PAC PT 6 Clicks Mobility  Outcome Measure Help needed turning from your back to your side while in a flat bed without using bedrails?: A Lot Help needed moving from lying on your back to sitting on the side of a flat bed without using bedrails?: A Lot Help needed moving to and from a bed to a chair (including a wheelchair)?: A Lot Help needed standing up from a chair using your arms (e.g., wheelchair or bedside chair)?: A Lot Help needed to walk in hospital room?: A Lot Help needed climbing 3-5 steps with a railing? : A Lot 6 Click Score: 12    End of Session Equipment Utilized During Treatment: Gait belt Activity Tolerance: Patient tolerated treatment well;Patient limited by fatigue;Patient limited by pain Patient left: in chair;with call bell/phone within reach Nurse Communication: Mobility status PT Visit Diagnosis: Unsteadiness on feet (R26.81);Other abnormalities of gait and mobility (R26.89);Muscle weakness (generalized) (M62.81)    Time: 9141-9077 PT Time Calculation (min) (ACUTE ONLY): 24 min   Charges:   PT Evaluation $PT Eval Moderate Complexity: 1 Mod PT Treatments $Therapeutic Activity: 23-37 mins PT General Charges $$ ACUTE  PT VISIT: 1 Visit         2:01 PM, 02/22/24 Lynwood Music, MPT Physical Therapist with [facility] [phone] office [phone] mobile phone

## 2024-02-22 NOTE — ED Triage Notes (Signed)
 Patient BIB [facility] from home, Complaint of Back pain and Diarrhea. Stated was seen a week ago for a fall and informed has a lower back fracture. Seen Friday by PCP but did not inform provider of Diarrhea. States diarrhea has been going on for 5 days and taken Pepto bismol. Has been having difficulty ambulating. Pain 10/10 constant throbbing. HX HTN and DM. EMS vitals 173/57,pulse 58, 97% RA, CBG 134.

## 2024-02-22 NOTE — ED Notes (Signed)
 Attempted to obtain rocephin  for pt. Medication not available in pyxis. Pharmacy notified.

## 2024-02-22 NOTE — Plan of Care (Signed)
  Problem: Acute Rehab PT Goals(only PT should resolve) Goal: Pt Will Go Supine/Side To Sit Outcome: Progressing Flowsheets (Taken 02/22/2024 1403) Pt will go Supine/Side to Sit: with minimal assist Goal: Patient Will Transfer Sit To/From Stand Outcome: Progressing Flowsheets (Taken 02/22/2024 1403) Patient will transfer sit to/from stand: with minimal assist Goal: Pt Will Transfer Bed To Chair/Chair To Bed Outcome: Progressing Flowsheets (Taken 02/22/2024 1403) Pt will Transfer Bed to Chair/Chair to Bed: with min assist Goal: Pt Will Ambulate Outcome: Progressing Flowsheets (Taken 02/22/2024 1403) Pt will Ambulate:  25 feet  with minimal assist  with rolling walker   2:03 PM, 02/22/24 Lynwood Music, MPT Physical Therapist with [facility] [phone] office [phone] mobile phone

## 2024-02-22 NOTE — NC FL2 (Signed)
 [place]  MEDICAID [id number] LEVEL OF CARE FORM     IDENTIFICATION  Patient Name: [name] Birthdate: Oct 20, 1945 Sex: female Admission Date (Current Location): 02/22/2024  [facility] and [place] Number:  [employer] and Address:  [facility],  [address], [place] [address]      Provider Number: [id number]  Attending Physician Name and Address:  Pearlean Manus, MD  Relative Name and Phone Number:  Adel,[name] (Spouse)  [phone]    Current Level of Care: Hospital Recommended Level of Care: Skilled Nursing Facility Prior Approval Number:    Date Approved/Denied:   PASRR Number: [id number]  Discharge Plan: SNF    Current Diagnoses: Patient Active Problem List   Diagnosis Date Noted   Falls 02/22/2024   Insulin  long-term use (HCC) 10/21/2022   Iron deficiency anemia secondary to blood loss (chronic) 09/02/2022   CKD (chronic kidney disease) stage 3, GFR 30-59 ml/min (HCC) 04/28/2022   Diabetic ulcer of right great toe (HCC) 03/29/2022   Personal history of diabetic foot ulcer 09/29/2021   Lightheaded 08/21/2021   Idiopathic chronic gout, multiple sites, with tophus (tophi) 04/03/2021   Overweight 04/03/2021   Polyarthralgia 04/03/2021   Cystoid macular edema, right eye 01/14/2020   Type 2 diabetes mellitus with stage 3 chronic kidney disease, with long-term current use of insulin  (HCC) 10/02/2019   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 10/02/2019   Retinal hemorrhage of left eye 10/02/2019   Retinal microaneurysm of both eyes 10/02/2019   Vitamin D  deficiency 11/15/2017   Diabetic foot infection (HCC) 05/05/2016   Anemia in chronic renal disease    Gastroesophageal reflux disease without esophagitis 10/11/2014   Hypothyroidism 05/18/2014   Osteoporosis 09/06/2013   Mixed hyperlipidemia    Diabetic retinopathy (HCC) 10/28/2007   Hyperlipidemia associated with type 2 diabetes  mellitus (HCC) 10/28/2007   Anxiety state 10/28/2007   Depression 10/28/2007   Essential hypertension 10/28/2007   COLONIC POLYPS, HYPERPLASTIC 11/17/2006   Diverticulosis of colon 11/17/2006    Orientation RESPIRATION BLADDER Height & Weight     Self, Time, Situation, Place  Normal Incontinent Weight: 66.7 kg Height:  5' 1 (154.9 cm)  BEHAVIORAL SYMPTOMS/MOOD NEUROLOGICAL BOWEL NUTRITION STATUS      Continent Diet (See DC Summary)  AMBULATORY STATUS COMMUNICATION OF NEEDS Skin   Extensive Assist Verbally Bruising                       Personal Care Assistance Level of Assistance  Bathing, Feeding, Dressing Bathing Assistance: Maximum assistance Feeding assistance: Limited assistance Dressing Assistance: Maximum assistance     Functional Limitations Info  Sight, Hearing, Speech Sight Info: Adequate Hearing Info: Adequate Speech Info: Adequate    SPECIAL CARE FACTORS FREQUENCY                       Contractures Contractures Info: Not present    Additional Factors Info  Code Status, Allergies Code Status Info: FULL Allergies Info: Aspirin  Ciprofloxacin  Codeine  Hydrocodone -acetaminophen   Losartan   Micardis (Telmisartan)  Nexium (Esomeprazole Magnesium )  Niaspan (Niacin Er (Antihyperlipidemic))  Nsaids  Onglyza (Saxagliptin )  Other  Oxycodone  Hcl  Rofecoxib  Simvastatin  Tequin (Gatifloxacin)  Welchol (Colesevelam Hcl)  Amoxicillin           Current Medications (02/22/2024):  This is the current hospital active medication list Current Facility-Administered Medications  Medication Dose Route Frequency Provider Last Rate Last Admin  0.9 %  sodium chloride  infusion   Intravenous PRN Emokpae, Courage, MD       acetaminophen  (TYLENOL ) tablet 650 mg  650 mg Oral Q6H PRN Emokpae, Courage, MD       Or   acetaminophen  (TYLENOL ) suppository 650 mg  650 mg Rectal Q6H PRN Emokpae, Courage, MD       amLODipine  (NORVASC ) tablet 5 mg  5 mg Oral Daily Emokpae,  Courage, MD   5 mg at 02/22/24 1039   atorvastatin  (LIPITOR) tablet 10 mg  10 mg Oral Daily Emokpae, Courage, MD   10 mg at 02/22/24 1039   bisacodyl  (DULCOLAX) suppository 10 mg  10 mg Rectal Daily PRN Pearlean Manus, MD       busPIRone  (BUSPAR ) tablet 5 mg  5 mg Oral BID Emokpae, Courage, MD   5 mg at 02/22/24 1038   carvedilol  (COREG ) tablet 6.25 mg  6.25 mg Oral BID WC Emokpae, Courage, MD   6.25 mg at 02/22/24 1038   [START ON 02/23/2024] cephALEXin  (KEFLEX ) capsule 500 mg  500 mg Oral TID Pearlean Manus, MD       heparin  injection 5,000 Units  5,000 Units Subcutaneous Q8H Emokpae, Courage, MD       HYDROcodone -acetaminophen  (NORCO/VICODIN) 5-325 MG per tablet 1 tablet  1 tablet Oral Q4H PRN Emokpae, Courage, MD       HYDROmorphone  (DILAUDID ) injection 0.5 mg  0.5 mg Intravenous Q4H PRN Emokpae, Courage, MD   0.5 mg at 02/22/24 1053   insulin  aspart (novoLOG ) injection 0-15 Units  0-15 Units Subcutaneous TID WC Emokpae, Courage, MD       insulin  aspart (novoLOG ) injection 0-5 Units  0-5 Units Subcutaneous QHS Emokpae, Courage, MD       insulin  aspart (novoLOG ) injection 3 Units  3 Units Subcutaneous TID WC Emokpae, Courage, MD   3 Units at 02/22/24 1309   [START ON 02/23/2024] levothyroxine  (SYNTHROID ) tablet 50 mcg  50 mcg Oral QAC breakfast Emokpae, Courage, MD       losartan  (COZAAR ) tablet 50 mg  50 mg Oral Daily Emokpae, Courage, MD   50 mg at 02/22/24 1038   ondansetron  (ZOFRAN ) tablet 4 mg  4 mg Oral Q6H PRN Emokpae, Courage, MD       Or   ondansetron  (ZOFRAN ) injection 4 mg  4 mg Intravenous Q6H PRN Emokpae, Courage, MD       polyethylene glycol (MIRALAX  / GLYCOLAX ) packet 17 g  17 g Oral Daily PRN Emokpae, Courage, MD       sodium chloride  flush (NS) 0.9 % injection 3 mL  3 mL Intravenous Q12H Emokpae, Courage, MD   3 mL at 02/22/24 1059   sodium chloride  flush (NS) 0.9 % injection 3 mL  3 mL Intravenous Q12H Emokpae, Courage, MD   3 mL at 02/22/24 1059   sodium chloride  flush  (NS) 0.9 % injection 3 mL  3 mL Intravenous PRN Emokpae, Courage, MD       traZODone  (DESYREL ) tablet 50 mg  50 mg Oral QHS PRN Emokpae, Courage, MD       Current Outpatient Medications  Medication Sig Dispense Refill   amLODipine  (NORVASC ) 5 MG tablet Take 1 tablet (5 mg total) by mouth daily. 90 tablet 3   atorvastatin  (LIPITOR) 10 MG tablet Take 1 tablet (10 mg total) by mouth daily. 90 tablet 03   carvedilol  (COREG ) 12.5 MG tablet TAKE 1 TABLET 2 TIMES A DAY WITH MEALS **NEEDS TO BE SEEN BEFORE NEXT REFILL** 60  tablet 0   Insulin  Glargine (BASAGLAR  KWIKPEN [place]) Inject 10 Units into the skin in the morning.     ketorolac  (ACULAR ) 0.5 % ophthalmic solution Place 1 drop into the right eye 2 (two) times daily.     levothyroxine  (SYNTHROID ) 50 MCG tablet Take 1 tablet (50 mcg total) by mouth daily before breakfast. 90 tablet 1   HYDROcodone -acetaminophen  (NORCO/VICODIN) 5-325 MG tablet Take 1 tablet by mouth every 8 (eight) hours as needed. (Patient not taking: Reported on 02/22/2024) 6 tablet 0   losartan  (COZAAR ) 100 MG tablet 1 tablet Orally Once a day (Patient not taking: Reported on 02/22/2024)       Discharge Medications: Please see discharge summary for a list of discharge medications.  Relevant Imaging Results:  Relevant Lab Results:   Additional Information SS# [id number]  Sharlyne Stabs, RN

## 2024-02-22 NOTE — ED Notes (Signed)
 Called pharmacy for antibiotic. Med still unavailable for use from pyxis.

## 2024-02-22 NOTE — ED Notes (Signed)
 Patient transported to CT

## 2024-02-22 NOTE — H&P (Signed)
 History and Physical    Patient: [name] FMW:[id number] DOB: January 09, 1946 DOA: 02/22/2024 DOS: the patient was seen and examined on 02/22/2024 PCP: Jolinda Norene HERO, DO  Patient coming from: Home  Chief Complaint:  Chief Complaint  Patient presents with   Back Pain   Diarrhea   HPI: [name] is a 78 y.o. female with medical history significant of  CKD IV, mild chronic anemia of CKD, gout, DM2, diabetic retinopathy and gastroparesis HTN and hypothyroidism who presents to the ED with with complaints of back pain after recent fall with imaging study showing lumbar compression fracture. Patient apparently had a mechanical fall around 02/11/2024 at home, she was seen in the ED on 02/14/2024, Lumbar x-rays on 02/14/2024 showed L1 compression fracture - Patient returns to the ED on 02/22/2024 and patient reports increased back pain interfering with ability to ambulate and perform mobility related ADLs... Husband is elderly and daily with malignancy and unable to help at home No fever  Or chills ,  No Nausea, Vomiting or Diarrhea - No bowel or urinary incontinence--no red flag symptoms - Patient with some urinary frequency - UA suggestive of possible UTI - WBC 9.0 hemoglobin 11.5 which is close to prior baseline platelets 183 - Creatinine 1.97 which is not far from prior baseline potassium 5.0 sodium 135 GFR is 25, LFTs are not elevated  CT abdomen and pelvis without contrast on 02/22/2024 without acute findings, note is made of 5.2 x 4.4 cm well-defined relatively homogeneous left adrenal mass has attenuation too high to allow classification as an adenoma. This was present on the remote study from 16 years ago measuring 4.5 x 3.7 cm at that time. Given the relatively minimal change over such a long time interval, this is compatible with a benign etiology such as lipid poor adenoma. No followup imaging is recommended.  - CT lumbar spine on 02/22/2023 showed Superior endplate  compression deformity at L1 appears nonacute by CT but is new since the 2016 exam. No posterior retropulsion of fracture fragments. 2. Stable compression deformities at L3 and L4. 3. Mild multifactorial central canal stenosis at L4-5.  Review of Systems: As mentioned in the history of present illness. All other systems reviewed and are negative. Past Medical History:  Diagnosis Date   Anal fistula    Anemia in chronic renal disease    Aranesp  injection --  when Hg <11, last injection 12-24-14.   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    CKD (chronic kidney disease), stage III [facility])    nephrologist--  dr froylan-- LOV  07-09-2016   Complication of anesthesia    post-op confusion    Constipation    Coronary artery disease    Diabetic gastroparesis (HCC)    Diabetic retinopathy (HCC)    bilateral --  monitored by dr elner   Diverticulosis of colon    GAD (generalized anxiety disorder)    GERD (gastroesophageal reflux disease)    Hip fracture requiring operative repair (HCC)    History of colon polyps    benign   History of esophagitis    History of GI bleed    upper  2009  due to esophagitis  &  2002  due to Mallory-Weiss tear   History of hyperkalemia    pt had previously been canceled twice dos due to elevated K+, 07-29-2016 last date cancelled -- pt visited pcp same day treated w/ kayexelate and K+ came down, pt brought all her medication's in to pcp office and found pt was taking losartan  that had been discontinued due to ckd, pcp stated this was cause of elevated K+   History of Mallory-Weiss syndrome    12/ 2002--  resolved   History of rectal abscess     12-29-2004  bedside I & D   Hyperlipidemia    Hypertension    Hypothyroidism    LAFB (left anterior fascicular block)    Right bundle branch block    Sacral decubitus ulcer    since 2014- 01-02-15 remains with wound gauze dressing changes daily.   Type II diabetes mellitus (HCC)    Past Surgical History:  Procedure Laterality Date   APPLICATION OF A-CELL OF EXTREMITY N/A 04/07/2015   Procedure: A CELL PLACMENT;  Surgeon: Estefana RAMAN Dillingham, DO;  Location: [facility] OR;  Service: Plastics;  Laterality: N/A;   CARDIOVASCULAR STRESS TEST  12/30/2004   normal perfusion study/  no ischemia or infartion/  normal LV wall function and wall motion , ef 66%   CATARACT EXTRACTION W/ INTRAOCULAR LENS  IMPLANT, BILATERAL  1995   COLONOSCOPY  2008   w/Dr.[email or web address]    COLONOSCOPY W/ POLYPECTOMY  last one 2008   COMPRESSION HIP SCREW Right 05/18/2014   Procedure: COMPRESSION HIP;  Surgeon: Taft FORBES Minerva, MD;  Location: [facility];  Service: Orthopedics;  Laterality: Right;   ESOPHAGOGASTRODUODENOSCOPY  last one 01-09-2011   EVALUATION UNDER ANESTHESIA WITH FISTULECTOMY N/A 01/06/2015   Procedure: EXAM UNDER ANESTHESIA , placement of seton;  Surgeon: Krystal Russell, MD;  Location: [facility];  Service: General;  Laterality: N/A;   I & D EXTREMITY N/A 04/07/2015   Procedure: IRRIGATION AND DEBRIDEMENT ISCHIAL ULCER;  Surgeon: Estefana RAMAN Dillingham, DO;  Location: [facility];  Service: Plastics;  Laterality: N/A;   INCISION AND DRAINAGE OF WOUND N/A 09/30/2014   Procedure: IRRIGATION AND DEBRIDEMENT SACRAL WOUND, EXCISION OF PERIRECTAL TRACT WITH PLACEMENT OF ACCELL;  Surgeon: Estefana Reichert, DO;  Location: [facility];  Service: Plastics;  Laterality: N/A;   LIGATION OF INTERNAL FISTULA TRACT N/A 10/22/2016   Procedure: LIGATION OF INTERNAL FISTULA TRACT;  Surgeon: Debby Hila, MD;  Location: [facility];  Service: General;  Laterality: N/A;   ORIF FEMUR FRACTURE Left 10/09/2012    Procedure: OPEN REDUCTION INTERNAL FIXATION (ORIF) DISTAL FEMUR FRACTURE;  Surgeon: Ozell VEAR Bruch, MD;  Location: [facility];  Service: Orthopedics;  Laterality: Left;   RETINOPATHY SURGERY Bilateral 1980's?   SKIN CANCER EXCISION  03/2023   TRANSTHORACIC ECHOCARDIOGRAM  02/18/2011   mild LVH,  ef 55-60%,  grade I diastolic dysfunction/  mild TR/  RV systolic pressure increased consistant with moderate pulmonary hypertension   Social History:  reports that she has never smoked. She has never used smokeless tobacco. She reports that she does not drink alcohol and does not use drugs.  Allergies  Allergen Reactions   Aspirin Nausea And Vomiting   Ciprofloxacin Other (See Comments)    Upset Stomach   Codeine Nausea And Vomiting    Makes me sick    Hydrocodone -Acetaminophen      Other reaction(s): Unknown  09/14/22: Patient states that when she takes any  type of pain medication she starts vomiting blood. States she was told not to take any OTC pain medication.   Losartan      Hyperkalemia    Micardis [Telmisartan] Other (See Comments)    unknown   Nexium [Esomeprazole Magnesium ] Other (See Comments)    Causes internal bleeding   Niaspan [Niacin Er (Antihyperlipidemic)] Other (See Comments)    Reaction is unknown   Nsaids     Other reaction(s): Unknown   Onglyza [Saxagliptin ] Other (See Comments)    Reaction is unknown   Other     No otc pain medications   Oxycodone  Hcl     Other reaction(s): Unknown   Rofecoxib Other (See Comments)    unknown   Simvastatin    Tequin [Gatifloxacin] Other (See Comments)    Reaction is unknown   Welchol [Colesevelam Hcl]    Amoxicillin Nausea And Vomiting and Rash    Has patient had a PCN reaction causing immediate rash, facial/tongue/throat swelling, SOB or lightheadedness with hypotension: Yes Has patient had a PCN reaction causing severe rash involving mucus membranes or skin necrosis: no  Has patient had a PCN reaction that required  hospitalization No Has patient had a PCN reaction occurring within the last 10 years: No If all of the above answers are NO, then may proceed with Cephalosporin use.     Family History  Problem Relation Age of Onset   Coronary artery disease Mother 95   Heart disease Mother    Diabetes Mother    Colon cancer Father 7   Prostate cancer Father    Diabetes Father    Coronary artery disease Sister 22   Diabetes Sister    Heart disease Sister    Diabetes Sister    Diabetes Sister    Diabetes Sister    Diabetes Sister    Diabetes Sister    Diabetes Maternal Grandmother    Esophageal cancer Neg Hx    Stomach cancer Neg Hx    Rectal cancer Neg Hx     Prior to Admission medications   Medication Sig Start Date End Date Taking? Authorizing Provider  amLODipine  (NORVASC ) 5 MG tablet Take 1 tablet (5 mg total) by mouth daily. 07/18/23  Yes Jolinda Potter M, DO  atorvastatin  (LIPITOR) 10 MG tablet Take 1 tablet (10 mg total) by mouth daily. 07/18/23  Yes Gottschalk, Ashly M, DO  carvedilol  (COREG ) 12.5 MG tablet TAKE 1 TABLET 2 TIMES A DAY WITH MEALS **NEEDS TO BE SEEN BEFORE NEXT REFILL** 01/16/24  Yes Jolinda Potter M, DO  Insulin  Glargine (BASAGLAR  KWIKPEN [place]) Inject 10 Units into the skin in the morning.   Yes [provider]  ketorolac  (ACULAR ) 0.5 % ophthalmic solution Place 1 drop into the right eye 2 (two) times daily. 12/31/23  Yes [provider]  levothyroxine  (SYNTHROID ) 50 MCG tablet Take 1 tablet (50 mcg total) by mouth daily before breakfast. 06/01/23  Yes Nida, Gebreselassie W, MD  HYDROcodone -acetaminophen  (NORCO/VICODIN) 5-325 MG tablet Take 1 tablet by mouth every 8 (eight) hours as needed. Patient not taking: Reported on 02/22/2024 02/14/24   Suellen Cantor A, PA-C  losartan  (COZAAR ) 100 MG tablet 1 tablet Orally Once a day Patient not taking: Reported on 02/22/2024    [provider]    Physical Exam: Vitals:   02/22/24 1215 02/22/24 1230  02/22/24 1245 02/22/24 1300  BP: (!) 171/60 (!) 143/56 (!) 148/55   Pulse: (!) 50 (!) 50 (!) 58   Resp:   18  18  Temp:   98.2 F (36.8 C)   TempSrc:      SpO2: 93% 96% 97%   Weight:      Height:        Physical Exam  Gen:- Awake Alert, in no acute distress  HEENT:- .AT, No sclera icterus Neck-Supple Neck,No JVD,.  Lungs-  CTAB , fair air movement bilaterally  CV- S1, S2 normal, RRR Abd-  +ve B.Sounds, Abd Soft, No tenderness, no CVA area tenderness Extremity/Skin:- No  edema,   good pedal pulses  Psych-affect is appropriate, oriented x3 Neuro-mobility related difficulties, however no new focal deficits, no tremors MSK--discomfort over upper lumbar area with palpation and with positional change/range of motion--overlying skin without erythema swelling or obvious skin changes -  Data Reviewed: - - WBC 9.0 hemoglobin 11.5 which is close to prior baseline platelets 183 - Creatinine 1.97 which is not far from prior baseline potassium 5.0 sodium 135 GFR is 25, LFTs are not elevated - UA suggestive of UTI  CT abdomen and pelvis without contrast on 02/22/2024 without acute findings, note is made of 5.2 x 4.4 cm well-defined relatively homogeneous left adrenal mass has attenuation too high to allow classification as an adenoma. This was present on the remote study from 16 years ago measuring 4.5 x 3.7 cm at that time. Given the relatively minimal change over such a long time interval, this is compatible with a benign etiology such as lipid poor adenoma. No followup imaging is recommended.  - CT lumbar spine on 02/22/2023 showed Superior endplate compression deformity at L1 appears nonacute by CT but is new since the 2016 exam. No posterior retropulsion of fracture fragments. 2. Stable compression deformities at L3 and L4. 3. Mild multifactorial central canal stenosis at L4-5.  Assessment and Plan: 1)Acute L 1 compression fracture with difficulty with pain control and limitations with  mobility related ADLs-- -CT lumbar spine on 02/22/2023 showed Superior endplate compression deformity at L1 appears nonacute by CT but is new since the 2016 exam.  --No red flags at this time -initial injury 02/11/2024, seen in the ED on 02/14/2024 - Patient failed conservative management at home - Presents again on 02/22/2024 with difficulty with pain control and difficulties with mobility related ADLs -- PT eval appreciated recommends SNF rehab - Give methocarbamol , as needed opiates -- Currently requiring IV Dilaudid  due to severity of pain Recurrent falls---PTA pt lived at home and did very poorly, patient has significant limitations with mobility related ADLs- this patient needs to continue to be monitored in the hospital until a SNF bed is obtained as she is not safe to go home with her current physcical limitations   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 This was present on the remote study from 16 years ago measuring 4.5 x 3.7 cm at that time. Given the relatively minimal change over such a long time interval, this is compatible with a benign etiology such as lipid poor adenoma. No followup imaging is recommended  3)CKD stage -IV  -CKD IV with mild chronic anemia of CKD,  -hemoglobin 11.5 which is close to prior baseline  - Creatinine 1.97 which is not far from prior baseline potassium 5.0 ,GFR is 25, - renally adjust medications, avoid nephrotoxic agents / dehydration  / hypotension  4)DM2, diabetic retinopathy and gastroparesis--- A1c 6.9 reflecting fair diabetic control PTA Use Novolog /Humalog Sliding scale insulin  with Accu-Cheks/Fingersticks as ordered  5)HTN--Stable, continue amlodipine , Coreg  and losartan   6)Possible UTI--Patient with some urinary frequency - UA suggestive of possible UTI - No fevers or leukocytosis - Received Rocephin  in the ED - Give  Keflex  pending urine culture  7) hypothyroidism--continue levothyroxine   8)HLD--continue atorvastatin   Disposition--Patient apparently had a mechanical fall around 02/11/2024 at home, she was seen in the ED on 02/14/2024, Lumbar x-rays on 02/14/2024 showed L1 compression fracture - Patient returns to the ED on 02/22/2024 and patient reports increased back pain interfering with ability to ambulate and perform mobility related ADLs... Husband is elderly and daily with malignancy and unable to help at home No fever  Or chills ,  No Nausea, Vomiting or Diarrhea - No bowel or urinary incontinence--no red flag symptoms - She failed pain management at home after being discharged from the ED recently -PT eval appreciated recommends SNF rehab - Give methocarbamol , as needed opiates -- Currently requiring IV Dilaudid  due to severity of pain Recurrent falls---PTA pt lived at home and did very poorly, patient has significant limitations with mobility related ADLs- this patient needs to continue to be monitored in the hospital until a SNF bed is obtained as she is not safe to go home with her current physcical limitations   Advance Care Planning:   Code Status: Full Code   Family Communication: Husband is primary contact  Severity of Illness: The appropriate patient status for this patient is OBSERVATION. Observation status is judged to be reasonable and necessary in order to provide the required intensity of service to ensure the patient's safety. The patient's presenting symptoms, physical exam findings, and initial radiographic and laboratory data in the context of their medical condition is felt to place them at decreased risk for further clinical deterioration. Furthermore, it is anticipated that the patient will be medically stable for discharge from the hospital within 2 midnights of admission.   Author: Rendall Carwin, MD 02/22/2024 5:27 PM  For on call review [email or web address].

## 2024-02-23 DIAGNOSIS — R296 Repeated falls: Secondary | ICD-10-CM | POA: Diagnosis not present

## 2024-02-23 LAB — GLUCOSE, CAPILLARY
Glucose-Capillary: 131 mg/dL — ABNORMAL HIGH (ref 70–99)
Glucose-Capillary: 107 mg/dL — ABNORMAL HIGH (ref 70–99)
Glucose-Capillary: 134 mg/dL — ABNORMAL HIGH (ref 70–99)
Glucose-Capillary: 179 mg/dL — ABNORMAL HIGH (ref 70–99)
Glucose-Capillary: 137 mg/dL — ABNORMAL HIGH (ref 70–99)
Glucose-Capillary: 163 mg/dL — ABNORMAL HIGH (ref 70–99)

## 2024-02-23 NOTE — TOC Progression Note (Signed)
 Transition of Care [facility]) - Progression Note    Patient Details  Name: [name] MRN: [id number] Date of Birth: 09/27/1945  Transition of Care [facility]) CM/SW Contact  Noreen KATHEE [place], [place] Phone Number: 02/23/2024, 3:24 PM  Clinical Narrative:     [facility] offered patient a bed. CSW spoke with patient at bedside , patient agreed with bed offer. Auth was started today and facility was made aware. CSW will continue to follow.  Expected Discharge Plan: Skilled Nursing Facility Barriers to Discharge: Continued Medical Work up               Expected Discharge Plan and Services       Living arrangements for the past 2 months: Single Family Home                                       Social Drivers of Health (SDOH) Interventions SDOH Screenings   Food Insecurity: Food Insecurity Present (02/22/2024)  Housing: Low Risk  (02/22/2024)  Transportation Needs: No Transportation Needs (02/22/2024)  Utilities: Not At Risk (02/22/2024)  Alcohol Screen: Low Risk  (05/27/2023)  Depression (PHQ2-9): Low Risk  (10/24/2023)  Financial Resource Strain: Low Risk  (05/27/2023)  Physical Activity: Insufficiently Active (05/27/2023)  Social Connections: Moderately Integrated (02/22/2024)  Stress: No Stress Concern Present (05/27/2023)  Tobacco Use: Low Risk  (02/22/2024)  Health Literacy: Adequate Health Literacy (05/27/2023)    Readmission Risk Interventions     No data to display

## 2024-02-23 NOTE — Plan of Care (Signed)

## 2024-02-23 NOTE — Progress Notes (Signed)
 Occupational Therapy Treatment Patient Details Name: [name] MRN: [id number] DOB: December 27, 1945 Today's Date: 02/23/2024   History of present illness [name] is a 78 y.o. female recently had a fall at home and found to have a lumbar compression fracture on xray. She was given some pain medication which helped some but she has run out and pain not well controlled with APAP. She has had difficulty functioning at home, husband is not able to fully care for her. She has also had several episodes of diarrhea, no vomiting or dysuria. Minimal abdominal discomfort. No bleeding. (per MD)   OT comments  Pt agreeable to OT treatment. Pt found seated in the chair with pt reporting that nursing had assisted her to the chair. Pt requested to be put back in bed by the end of the session. Min to mod A for ~6 feet of ambulation in the room with the RW. Pt reports significant pain that did limit pt at times. Pt able to complete lower body dressing with min A today seated in the recliner. Pt was able to go from sit to supine in bed with CGA but increased pain. Pt left in the bed with bed alarm set and call bell within reach. Pt reports that she has a back brace that her family was going to bring from home. Pt will benefit from continued OT in the hospital to increase strength, balance, and endurance for safe ADL's.         If plan is discharge home, recommend the following:  Assistance with cooking/housework;Assist for transportation;Help with stairs or ramp for entrance;A little help with walking and/or transfers;A little help with bathing/dressing/bathroom   Equipment Recommendations  None recommended by OT          Precautions / Restrictions Precautions Precautions: Fall Recall of Precautions/Restrictions: Intact Restrictions Weight Bearing Restrictions Per Provider Order: No       Mobility Bed Mobility Overal bed mobility: Needs Assistance Bed Mobility: Sit to Supine       Sit to  supine: Contact guard assist   General bed mobility comments: Labored movement and increased pain.    Transfers Overall transfer level: Needs assistance Equipment used: Rolling walker (2 wheels) Transfers: Sit to/from Stand, Bed to chair/wheelchair/BSC Sit to Stand: Min assist, Contact guard assist     Step pivot transfers: Min assist, Contact guard assist     General transfer comment: Chair to EOB with labored movement; Slight assist to boost from chair with RW.     Balance Overall balance assessment: Needs assistance Sitting-balance support: No upper extremity supported, Feet supported Sitting balance-Leahy Scale: Fair Sitting balance - Comments: fair to good seated at EOB   Standing balance support: Reliant on assistive device for balance, During functional activity, Bilateral upper extremity supported Standing balance-Leahy Scale: Fair Standing balance comment: using RW                           ADL either performed or assessed with clinical judgement   ADL Overall ADL's : Needs assistance/impaired                     Lower Body Dressing: Minimal assistance;Sitting/lateral leans Lower Body Dressing Details (indicate cue type and reason): Pt able to doff and don L sock. Min A needed to don R sock while seated in the recliner.             Functional mobility during ADLs:  Minimal assistance;Rolling walker (2 wheels) General ADL Comments: Pt able to ambualte ~6 feet total forward and backward from recliner near bed with RW. Assist to manage RW when taking steps backwards to chair.     Communication Communication Communication: No apparent difficulties   Cognition Arousal: Alert Behavior During Therapy: WFL for tasks assessed/performed Cognition: No apparent impairments                               Following commands: Intact        Cueing   Cueing Techniques: Verbal cues, Tactile cues  Exercises                    Pertinent Vitals/ Pain       Pain Assessment Pain Assessment: 0-10 Pain Score: 10-Worst pain ever Pain Location: back Pain Descriptors / Indicators: Aching Pain Intervention(s): Limited activity within patient's tolerance, Monitored during session, Repositioned                                                          Frequency  Min 3X/week        Progress Toward Goals  OT Goals(current goals can now be found in the care plan section)  Progress towards OT goals: Progressing toward goals  Acute Rehab OT Goals Patient Stated Goal: improve function OT Goal Formulation: With patient Time For Goal Achievement: 03/07/24 Potential to Achieve Goals: Good ADL Goals Pt Will Perform Grooming: with contact guard assist;standing Pt Will Perform Lower Body Bathing: with supervision;with contact guard assist;with adaptive equipment;sitting/lateral leans Pt Will Perform Lower Body Dressing: with contact guard assist;with supervision;sitting/lateral leans;with adaptive equipment Pt Will Transfer to Toilet: with supervision;ambulating Pt Will Perform Toileting - Clothing Manipulation and hygiene: with set-up;sit to/from stand;sitting/lateral leans Pt/caregiver will Perform Home Exercise Program: Increased ROM;Increased strength;Both right and left upper extremity;Independently  Plan                      AM-PAC OT 6 Clicks Daily Activity     Outcome Measure   Help from another person eating meals?: None Help from another person taking care of personal grooming?: A Little Help from another person toileting, which includes using toliet, bedpan, or urinal?: A Little Help from another person bathing (including washing, rinsing, drying)?: A Little Help from another person to put on and taking off regular upper body clothing?: A Little Help from another person to put on and taking off regular lower body clothing?: A Little 6 Click Score: 19    End of  Session Equipment Utilized During Treatment: Rolling walker (2 wheels);Gait belt  OT Visit Diagnosis: Unsteadiness on feet (R26.81);Other abnormalities of gait and mobility (R26.89);Muscle weakness (generalized) (M62.81);History of falling (Z91.81)   Activity Tolerance Patient tolerated treatment well   Patient Left in bed;with call bell/phone within reach;with bed alarm set   Nurse Communication Other (comment) (Notified pt was in the bed per the pt's request.)        Time: 8880-8866 OT Time Calculation (min): 14 min  Charges: OT General Charges $OT Visit: 1 Visit OT Treatments $Self Care/Home Management : 8-22 mins  Marline Morace OT, MOT  Jayson Person 02/23/2024, 12:13 PM

## 2024-02-23 NOTE — Progress Notes (Signed)
 PROGRESS NOTE  [name], is a 78 y.o. female, DOB - 12-04-1945, FMW:[id number]  Admit date - 02/22/2024   Admitting Physician Mehar Kirkwood Pearlean, MD  Outpatient Primary MD for the patient is Jolinda Norene HERO, DO  LOS - 0  Chief Complaint  Patient presents with   Back Pain   Diarrhea      Brief Narrative:  78 y.o. female with medical history significant of  CKD IV, mild chronic anemia of CKD, gout, DM2, diabetic retinopathy and gastroparesis HTN and hypothyroidism admitted on 02/22/2024 with intractable low back pain due to L1 compression fracture after mechanical fall at home on 02/11/2024   -Assessment and Plan: 1)Acute L 1 Compression Fracture with difficulty with pain control and limitations with mobility related ADLs-- -CT lumbar spine on 02/22/2023 showed Superior endplate compression deformity at L1 appears nonacute by CT but is new since the 2016 exam.  --No red flags at this time -initial injury 02/11/2024, seen in the ED on 02/14/2024 - Patient failed conservative management at home - Presents again on 02/22/2024 with difficulty with pain control and difficulties with mobility related ADLs -- Currently requiring pain control, continue methocarbamol  and opiates -Physical therapy/OT Eval appreciated, PT/OT recommends SNF rehab Recurrent falls---PTA pt lived at home and did very poorly, patient has significant limitations with mobility related ADLs- this patient needs to continue to be monitored in the hospital until a SNF bed is obtained as she is not safe to go home with her current physcical limitations  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 This was present on the remote study from 16 years ago measuring 4.5 x 3.7 cm at that time. Given the relatively minimal change over such a long time interval, this is compatible with a benign etiology such as lipid poor  adenoma. No followup imaging is recommended   3)CKD stage -IV  -CKD IV with mild chronic anemia of CKD,  -hemoglobin 11.5 which is close to prior baseline  - Creatinine 1.97 which is not far from prior baseline potassium 5.0 ,GFR is 25, - renally adjust medications, avoid nephrotoxic agents / dehydration  / hypotension   4)DM2, diabetic retinopathy and gastroparesis--- A1c 6.9 reflecting fair diabetic control PTA Use Novolog /Humalog Sliding scale insulin  with Accu-Cheks/Fingersticks as ordered    5)HTN--Stable, continue amlodipine , Coreg  and losartan    6)Possible UTI--Patient with some urinary frequency - UA suggestive of possible UTI - No fevers or leukocytosis - Received Rocephin  in the ED - c/n Keflex  pending urine culture   7) hypothyroidism--continue levothyroxine    8)HLD--continue atorvastatin    Disposition--Patient apparently had a mechanical fall around 02/11/2024 at home, she was seen in the ED on 02/14/2024, Lumbar x-rays on 02/14/2024 showed L1 compression fracture - Patient returns to the ED on 02/22/2024 and patient reports increased back pain interfering with ability to ambulate and perform mobility related ADLs... Husband is elderly and daily with malignancy and unable to help at home No fever  Or chills ,  No Nausea, Vomiting or Diarrhea - No bowel or urinary incontinence--no red flag symptoms - She failed pain management at home after being discharged from the ED recently -Currently requiring pain control, continue methocarbamol  and opiates -Physical therapy/OT Eval appreciated, PT/OT recommends SNF rehab -Recurrent falls---PTA pt lived at home and did very poorly, patient has significant limitations with mobility related ADLs- this patient needs to continue to be monitored in the hospital until a SNF bed  is obtained as she is not safe to go home with her current physcical limitations  Status is: Inpatient   Disposition: The patient is from: Home               Anticipated d/c is to: SNF              Anticipated d/c date is: 1 day              Patient currently is not medically stable to d/c. Barriers: Not Clinically Stable-   Code Status :  -  Code Status: Full Code   Family Communication:    NA (patient is alert, awake and coherent)   DVT Prophylaxis  :   - SCDs   heparin  injection 5,000 Units Start: 02/22/24 2200 SCDs Start: 02/22/24 0911 Place TED hose Start: 02/22/24 0911   Lab Results  Component Value Date   PLT 183 02/22/2024    Inpatient Medications  Scheduled Meds:  amLODipine   5 mg Oral Daily   atorvastatin   10 mg Oral Daily   busPIRone   5 mg Oral BID   carvedilol   6.25 mg Oral BID WC   cephALEXin   500 mg Oral TID   heparin   5,000 Units Subcutaneous Q8H   insulin  aspart  0-15 Units Subcutaneous TID WC   insulin  aspart  0-5 Units Subcutaneous QHS   insulin  aspart  3 Units Subcutaneous TID WC   levothyroxine   50 mcg Oral QAC breakfast   losartan   50 mg Oral Daily   methocarbamol   500 mg Oral TID   sodium chloride  flush  3 mL Intravenous Q12H   sodium chloride  flush  3 mL Intravenous Q12H   Continuous Infusions: PRN Meds:.acetaminophen  **OR** acetaminophen , bisacodyl , HYDROcodone -acetaminophen , HYDROmorphone  (DILAUDID ) injection, ondansetron  **OR** ondansetron  (ZOFRAN ) IV, polyethylene glycol, sodium chloride  flush, traZODone    Anti-infectives (From admission, onward)    Start     Dose/Rate Route Frequency Ordered Stop   02/23/24 1000  cephALEXin  (KEFLEX ) capsule 500 mg        500 mg Oral 3 times daily 02/22/24 0903 02/26/24 0959   02/22/24 0900  fluconazole (DIFLUCAN) tablet 200 mg        200 mg Oral  Once 02/22/24 0849 02/22/24 1039   02/22/24 0800  cefTRIAXone  (ROCEPHIN ) 2 g in sodium chloride  0.9 % 100 mL IVPB        2 g 200 mL/hr over 30 Minutes Intravenous  Once 02/22/24 0749 02/22/24 1259         Subjective: [name] today has no fevers, no emesis,  No chest pain,   - Low back pain control  improving   Objective: Vitals:   02/23/24 0322 02/23/24 0828 02/23/24 1406 02/23/24 1430  BP: (!) 122/90 (!) 163/55 (!) 145/51 (!) 141/54  Pulse: (!) 55 (!) 55 (!) 56 (!) 56  Resp: 16 18 20 18   Temp: 97.6 F (36.4 C) 98.9 F (37.2 C) 98.4 F (36.9 C) 98.1 F (36.7 C)  TempSrc: Oral Oral Oral Oral  SpO2: 96% 97% 97% 99%  Weight:      Height:       No intake or output data in the 24 hours ending 02/23/24 1819 Filed Weights   02/22/24 0455  Weight: 66.7 kg    Physical Exam  Gen:- Awake Alert,  in no apparent distress  HEENT:- [place].AT, No sclera icterus Neck-Supple Neck,No JVD,.  Lungs-  CTAB , fair symmetrical air movement CV- S1, S2 normal, regular  Abd-  +ve B.Sounds,  Abd Soft, No tenderness,    Extremity/Skin:- No  edema, pedal pulses present  Psych-affect is appropriate, oriented x3 Neuro-mobility related difficulties, however no new focal deficits, no tremors MSK--discomfort over upper lumbar area with palpation and with positional change/range of motion--overlying skin without erythema swelling or obvious skin changes  Data Reviewed: I have personally reviewed following labs and imaging studies  CBC: Recent Labs  Lab 02/22/24 0528  WBC 9.0  NEUTROABS 6.3  HGB 11.5*  HCT 34.2*  MCV 91.2  PLT 183   Basic Metabolic Panel: Recent Labs  Lab 02/22/24 0528  NA 135  K 5.0  CL 101  CO2 21*  GLUCOSE 120*  BUN 30*  CREATININE 1.97*  CALCIUM  9.3   GFR: Estimated Creatinine Clearance: 20.6 mL/min (A) (by C-G formula based on SCr of 1.97 mg/dL (H)). Liver Function Tests: Recent Labs  Lab 02/22/24 0528  AST 15  ALT <5  ALKPHOS 153*  BILITOT 0.5  PROT 7.1  ALBUMIN 4.1   HbA1C: Recent Labs    02/22/24 1055  HGBA1C 6.9*   Recent Results (from the past 240 hours)  Urine Culture     Status: None (Preliminary result)   Collection Time: 02/22/24  6:31 AM   Specimen: Urine, Clean Catch  Result Value Ref Range Status   Specimen Description   Final     URINE, CLEAN CATCH Performed at [facility], [address], [place], [place] [address]    Special Requests   Final    NONE Performed at [facility], [address] [place], [place] [address]    Culture   Final    CULTURE REINCUBATED FOR BETTER GROWTH Performed at [facility], [address] [place], [place] [address]    Report Status PENDING  Incomplete    Radiology Studies: CT L-SPINE NO CHARGE Result Date: 02/22/2024 CLINICAL DATA:  Back pain. History of recent lumbar spine compression fracture. EXAM: CT LUMBAR SPINE WITHOUT CONTRAST TECHNIQUE: Multidetector CT imaging of the lumbar spine was performed without intravenous contrast administration. Multiplanar CT image reconstructions were also generated. RADIATION DOSE REDUCTION: This exam was performed according to the departmental dose-optimization program which includes automated exposure control, adjustment of the mA and/or kV according to patient size and/or use of iterative reconstruction technique. COMPARISON:  Lumbar spine x-rays 02/14/2024. Lumbar spine MRI 12/26/2014. FINDINGS: Segmentation: 5 lumbar type vertebrae. Alignment: Normal. Vertebrae: Superior endplate compression deformity is noted at L1, L3, and L4 the compression deformities at L3 and L4 are stable to the remote lumbar spine MRI. Compression deformity at L1 appears nonacute by CT but is new since the 2016 exam. Deformity at the L1 level results in approximately 25% loss of height anteriorly with less than 25% loss of height posteriorly. No posterior retropulsion of fracture fragments. Paraspinal and other soft tissues: Left adrenal mass was characterized on CT abdomen and pelvis performed at the same time and reported separately. Disc levels: T12-L1: No central canal or foraminal stenosis. L1-2: No central canal or foraminal stenosis. L2-3: Degenerative disc disease. No central canal stenosis. Neural foramina are patent bilaterally. L3-4: No central canal or  foraminal stenosis. L4-5: Degenerative loss of disc height. Mild multifactorial central canal stenosis. Mild foraminal encroachment secondary to facet overgrowth. L5-S1: Unremarkable. IMPRESSION: 1. Superior endplate compression deformity at L1 appears nonacute by CT but is new since the 2016 exam. No posterior retropulsion of fracture fragments. 2. Stable compression deformities at L3 and L4. 3. Mild multifactorial central canal stenosis at L4-5.  4. Left adrenal mass was characterized on CT abdomen and pelvis performed at the same time and reported separately. Electronically Signed   By: Camellia Candle M.D.   On: 02/22/2024 06:55   CT ABDOMEN PELVIS WO CONTRAST Result Date: 02/22/2024 CLINICAL DATA:  Diarrhea. Chronic kidney disease. Recent lumbar compression fracture. Back pain. EXAM: CT ABDOMEN AND PELVIS WITHOUT CONTRAST TECHNIQUE: Multidetector CT imaging of the abdomen and pelvis was performed following the standard protocol without IV contrast. RADIATION DOSE REDUCTION: This exam was performed according to the departmental dose-optimization program which includes automated exposure control, adjustment of the mA and/or kV according to patient size and/or use of iterative reconstruction technique. COMPARISON:  Abdomen CT 01/23/2008. FINDINGS: Lower chest: Asymmetric elevation right hemidiaphragm with right base atelectasis or scarring. Hepatobiliary: No suspicious focal abnormality in the liver on this study without intravenous contrast. There is no evidence for gallstones, gallbladder wall thickening, or pericholecystic fluid. No intrahepatic or extrahepatic biliary dilation. Pancreas: No focal mass lesion. No dilatation of the main duct. No intraparenchymal cyst. No peripancreatic edema. Spleen: No splenomegaly. No suspicious focal mass lesion. Adrenals/Urinary Tract: Right adrenal gland unremarkable.5.2 x 4.4 cm well-defined relatively homogeneous left adrenal mass has attenuation too high to allow  classification as an adenoma. This was present on the remote study from 16 years ago measuring 4.5 x 3.7 cm at that time. This lesion was also evaluated by MRI in 2009. Given the relatively minimal change over such a long time., this is compatible with a benign etiology such as lipid poor adenoma. Both kidneys are atrophic without hydronephrosis. No evidence for hydroureter. The urinary bladder appears normal for the degree of distention. Stomach/Bowel: Stomach is unremarkable. No gastric wall thickening. No evidence of outlet obstruction. Duodenum is normally positioned as is the ligament of Treitz. Duodenal diverticulum noted. No small bowel wall thickening. No small bowel dilatation. The terminal ileum is normal. The appendix is normal. No gross colonic mass. No colonic wall thickening. Diverticular changes are noted in the left colon without evidence of diverticulitis. Vascular/Lymphatic: There is moderate atherosclerotic calcification of the abdominal aorta without aneurysm. There is no gastrohepatic or hepatoduodenal ligament lymphadenopathy. No retroperitoneal or mesenteric lymphadenopathy. No pelvic sidewall lymphadenopathy. Reproductive: The uterus is unremarkable.  There is no adnexal mass. Other: No intraperitoneal free fluid. Musculoskeletal: Bones are diffusely demineralized. Fixation hardware noted proximal right femur. No worrisome lytic or sclerotic osseous abnormality. See report for dedicated lumbar spine CT dictated separately. IMPRESSION: 1. No acute findings in the abdomen or pelvis. Specifically, no findings to explain the patient's history of diarrhea. 2. 5.2 x 4.4 cm well-defined relatively homogeneous left adrenal mass has attenuation too high to allow classification as an adenoma. This was present on the remote study from 16 years ago measuring 4.5 x 3.7 cm at that time. Given the relatively minimal change over such a long time interval, this is compatible with a benign etiology such as  lipid poor adenoma. No followup imaging is recommended. 3. Left colonic diverticulosis without diverticulitis. 4.  Aortic Atherosclerosis (ICD10-I70.0). Electronically Signed   By: Camellia Candle M.D.   On: 02/22/2024 06:49   Scheduled Meds:  amLODipine   5 mg Oral Daily   atorvastatin   10 mg Oral Daily   busPIRone   5 mg Oral BID   carvedilol   6.25 mg Oral BID WC   cephALEXin   500 mg Oral TID   heparin   5,000 Units Subcutaneous Q8H   insulin  aspart  0-15 Units Subcutaneous TID WC  insulin  aspart  0-5 Units Subcutaneous QHS   insulin  aspart  3 Units Subcutaneous TID WC   levothyroxine   50 mcg Oral QAC breakfast   losartan   50 mg Oral Daily   methocarbamol   500 mg Oral TID   sodium chloride  flush  3 mL Intravenous Q12H   sodium chloride  flush  3 mL Intravenous Q12H   Continuous Infusions:   LOS: 0 days   Rendall Carwin M.D on 02/23/2024 at 6:19 PM  Go to [email or web address] - for contact info  [facility] - Office  [phone]  If 7PM-7AM, please contact night-coverage [email or web address] 02/23/2024, 6:19 PM

## 2024-02-23 NOTE — Care Management Obs Status (Signed)
 MEDICARE OBSERVATION STATUS NOTIFICATION   Patient Details  Name: [name] MRN: [id number] Date of Birth: 30-Jan-1946   Medicare Observation Status Notification Given:  Yes    Duwaine LITTIE Ada 02/23/2024, 12:24 PM

## 2024-02-24 DIAGNOSIS — S32009A Unspecified fracture of unspecified lumbar vertebra, initial encounter for closed fracture: Secondary | ICD-10-CM | POA: Diagnosis not present

## 2024-02-24 DIAGNOSIS — N390 Urinary tract infection, site not specified: Secondary | ICD-10-CM | POA: Diagnosis not present

## 2024-02-24 DIAGNOSIS — S32000A Wedge compression fracture of unspecified lumbar vertebra, initial encounter for closed fracture: Secondary | ICD-10-CM | POA: Diagnosis not present

## 2024-02-24 DIAGNOSIS — R2681 Unsteadiness on feet: Secondary | ICD-10-CM | POA: Diagnosis not present

## 2024-02-24 DIAGNOSIS — E1143 Type 2 diabetes mellitus with diabetic autonomic (poly)neuropathy: Secondary | ICD-10-CM | POA: Diagnosis not present

## 2024-02-24 DIAGNOSIS — S32050A Wedge compression fracture of fifth lumbar vertebra, initial encounter for closed fracture: Secondary | ICD-10-CM | POA: Diagnosis not present

## 2024-02-24 DIAGNOSIS — E1122 Type 2 diabetes mellitus with diabetic chronic kidney disease: Secondary | ICD-10-CM | POA: Diagnosis not present

## 2024-02-24 DIAGNOSIS — E782 Mixed hyperlipidemia: Secondary | ICD-10-CM | POA: Diagnosis not present

## 2024-02-24 DIAGNOSIS — Z794 Long term (current) use of insulin: Secondary | ICD-10-CM | POA: Diagnosis not present

## 2024-02-24 DIAGNOSIS — M6281 Muscle weakness (generalized): Secondary | ICD-10-CM | POA: Diagnosis not present

## 2024-02-24 DIAGNOSIS — N183 Chronic kidney disease, stage 3 unspecified: Secondary | ICD-10-CM | POA: Diagnosis not present

## 2024-02-24 DIAGNOSIS — D3502 Benign neoplasm of left adrenal gland: Secondary | ICD-10-CM | POA: Diagnosis not present

## 2024-02-24 DIAGNOSIS — W19XXXA Unspecified fall, initial encounter: Secondary | ICD-10-CM | POA: Diagnosis not present

## 2024-02-24 DIAGNOSIS — R531 Weakness: Secondary | ICD-10-CM | POA: Diagnosis not present

## 2024-02-24 DIAGNOSIS — M1990 Unspecified osteoarthritis, unspecified site: Secondary | ICD-10-CM | POA: Diagnosis not present

## 2024-02-24 DIAGNOSIS — D631 Anemia in chronic kidney disease: Secondary | ICD-10-CM | POA: Diagnosis not present

## 2024-02-24 DIAGNOSIS — R197 Diarrhea, unspecified: Secondary | ICD-10-CM | POA: Diagnosis not present

## 2024-02-24 DIAGNOSIS — E11319 Type 2 diabetes mellitus with unspecified diabetic retinopathy without macular edema: Secondary | ICD-10-CM | POA: Diagnosis not present

## 2024-02-24 DIAGNOSIS — R296 Repeated falls: Secondary | ICD-10-CM | POA: Diagnosis not present

## 2024-02-24 DIAGNOSIS — E039 Hypothyroidism, unspecified: Secondary | ICD-10-CM | POA: Diagnosis not present

## 2024-02-24 DIAGNOSIS — R262 Difficulty in walking, not elsewhere classified: Secondary | ICD-10-CM | POA: Diagnosis not present

## 2024-02-24 DIAGNOSIS — I10 Essential (primary) hypertension: Secondary | ICD-10-CM | POA: Diagnosis not present

## 2024-02-24 DIAGNOSIS — I2510 Atherosclerotic heart disease of native coronary artery without angina pectoris: Secondary | ICD-10-CM | POA: Diagnosis not present

## 2024-02-24 DIAGNOSIS — K5900 Constipation, unspecified: Secondary | ICD-10-CM | POA: Diagnosis not present

## 2024-02-24 DIAGNOSIS — I129 Hypertensive chronic kidney disease with stage 1 through stage 4 chronic kidney disease, or unspecified chronic kidney disease: Secondary | ICD-10-CM | POA: Diagnosis not present

## 2024-02-24 DIAGNOSIS — F419 Anxiety disorder, unspecified: Secondary | ICD-10-CM | POA: Diagnosis not present

## 2024-02-24 DIAGNOSIS — Z743 Need for continuous supervision: Secondary | ICD-10-CM | POA: Diagnosis not present

## 2024-02-24 DIAGNOSIS — R2689 Other abnormalities of gait and mobility: Secondary | ICD-10-CM | POA: Diagnosis not present

## 2024-02-24 DIAGNOSIS — E875 Hyperkalemia: Secondary | ICD-10-CM | POA: Diagnosis not present

## 2024-02-24 DIAGNOSIS — M1A9XX0 Chronic gout, unspecified, without tophus (tophi): Secondary | ICD-10-CM | POA: Diagnosis not present

## 2024-02-24 DIAGNOSIS — E785 Hyperlipidemia, unspecified: Secondary | ICD-10-CM | POA: Diagnosis not present

## 2024-02-24 DIAGNOSIS — S32000D Wedge compression fracture of unspecified lumbar vertebra, subsequent encounter for fracture with routine healing: Secondary | ICD-10-CM | POA: Diagnosis not present

## 2024-02-24 DIAGNOSIS — G4700 Insomnia, unspecified: Secondary | ICD-10-CM | POA: Diagnosis not present

## 2024-02-24 DIAGNOSIS — B9620 Unspecified Escherichia coli [E. coli] as the cause of diseases classified elsewhere: Secondary | ICD-10-CM | POA: Diagnosis not present

## 2024-02-24 DIAGNOSIS — R41841 Cognitive communication deficit: Secondary | ICD-10-CM | POA: Diagnosis not present

## 2024-02-24 DIAGNOSIS — K219 Gastro-esophageal reflux disease without esophagitis: Secondary | ICD-10-CM | POA: Diagnosis not present

## 2024-02-24 DIAGNOSIS — N184 Chronic kidney disease, stage 4 (severe): Secondary | ICD-10-CM | POA: Diagnosis not present

## 2024-02-24 LAB — RENAL FUNCTION PANEL
Albumin: 3.3 g/dL — ABNORMAL LOW (ref 3.5–5.0)
Anion gap: 12 (ref 5–15)
BUN: 33 mg/dL — ABNORMAL HIGH (ref 8–23)
CO2: 19 mmol/L — ABNORMAL LOW (ref 22–32)
Calcium: 8.4 mg/dL — ABNORMAL LOW (ref 8.9–10.3)
Chloride: 105 mmol/L (ref 98–111)
Creatinine, Ser: 2.01 mg/dL — ABNORMAL HIGH (ref 0.44–1.00)
GFR, Estimated: 25 mL/min — ABNORMAL LOW (ref >60)
Glucose, Bld: 97 mg/dL (ref 70–99)
Phosphorus: 4.2 mg/dL (ref 2.5–4.6)
Potassium: 5.2 mmol/L — ABNORMAL HIGH (ref 3.5–5.1)
Sodium: 136 mmol/L (ref 135–145)

## 2024-02-24 LAB — GLUCOSE, CAPILLARY: Glucose-Capillary: 106 mg/dL — ABNORMAL HIGH (ref 70–99)

## 2024-02-24 MED ORDER — BASAGLAR KWIKPEN 100 UNIT/ML ~~LOC~~ SOPN: 6.0000 [IU] | PEN_INJECTOR | Freq: Every day | SUBCUTANEOUS | 0 refills | Status: DC

## 2024-02-24 MED ORDER — INSULIN ASPART 100 UNIT/ML FLEXPEN: 0.0000 [IU] | PEN_INJECTOR | Freq: Three times a day (TID) | SUBCUTANEOUS | 11 refills | Status: AC

## 2024-02-24 MED ORDER — SODIUM BICARBONATE 650 MG PO TABS: 650.0000 mg | ORAL_TABLET | Freq: Two times a day (BID) | ORAL | 0 refills | Status: AC

## 2024-02-24 MED ORDER — AMLODIPINE BESYLATE 5 MG PO TABS
5.0000 mg | ORAL_TABLET | Freq: Every day | ORAL | 3 refills | Status: AC
Start: 2024-02-24 — End: ?

## 2024-02-24 MED ORDER — LOSARTAN POTASSIUM 50 MG PO TABS: 50.0000 mg | ORAL_TABLET | Freq: Every day | ORAL | 5 refills | Status: DC

## 2024-02-24 MED ORDER — ATORVASTATIN CALCIUM 10 MG PO TABS: 10.0000 mg | ORAL_TABLET | Freq: Every day | ORAL | 3 refills | Status: AC

## 2024-02-24 MED ORDER — TRAZODONE HCL 50 MG PO TABS: 50.0000 mg | ORAL_TABLET | Freq: Every evening | ORAL | 0 refills | Status: DC | PRN

## 2024-02-24 MED ORDER — LOKELMA 10 G PO PACK: 10.0000 g | PACK | Freq: Every day | ORAL | 0 refills | Status: AC

## 2024-02-24 MED ORDER — SENNOSIDES-DOCUSATE SODIUM 8.6-50 MG PO TABS: 2.0000 | ORAL_TABLET | Freq: Every day | ORAL | 1 refills | Status: AC

## 2024-02-24 MED ORDER — ACETAMINOPHEN 325 MG PO TABS: 650.0000 mg | ORAL_TABLET | Freq: Four times a day (QID) | ORAL | Status: AC | PRN

## 2024-02-24 MED ORDER — METHOCARBAMOL 500 MG PO TABS: 500.0000 mg | ORAL_TABLET | Freq: Three times a day (TID) | ORAL | 0 refills | Status: AC

## 2024-02-24 MED ORDER — CARVEDILOL 6.25 MG PO TABS: 6.2500 mg | ORAL_TABLET | Freq: Two times a day (BID) | ORAL | 5 refills | Status: AC

## 2024-02-24 MED ORDER — CEPHALEXIN 500 MG PO CAPS: 500.0000 mg | ORAL_CAPSULE | Freq: Three times a day (TID) | ORAL | 0 refills | Status: AC

## 2024-02-24 MED ORDER — CARVEDILOL 12.5 MG PO TABS: ORAL_TABLET | ORAL | 3 refills | Status: DC

## 2024-02-24 MED ORDER — HYDROCODONE-ACETAMINOPHEN 5-325 MG PO TABS: 1.0000 | ORAL_TABLET | Freq: Three times a day (TID) | ORAL | 0 refills | Status: AC | PRN

## 2024-02-24 NOTE — TOC Transition Note (Signed)
 Transition of Care [facility]) - Discharge Note   Patient Details  Name: [name] MRN: [id number] Date of Birth: [birth year]  Transition of Care [facility]) CM/SW Contact:  Noreen KATHEE [name] [name] Phone Number: 02/24/2024, 11:28 AM   Clinical Narrative:     Patient is DC today to [facility]. Nurse was provided with room and number for report. DC summary was sent via HUB to [facility] . Destiny made aware. EMS called. CSW signing off.  Final next level of care: Skilled Nursing Facility Barriers to Discharge: Barriers Resolved   Patient Goals and CMS Choice Patient states their goals for this hospitalization and ongoing recovery are:: SNF CMS Medicare.gov Compare Post Acute Care list provided to:: Patient Choice offered to / list presented to : Patient      Discharge Placement              Patient chooses bed at:  [facility]) Patient to be transferred to facility by: Ambulance Name of family member notified: Sherrilyn Patient and family notified of of transfer: 02/24/24  Discharge Plan and Services Additional resources added to the After Visit Summary for                                       Social Drivers of Health (SDOH) Interventions SDOH Screenings   Food Insecurity: Food Insecurity Present (02/22/2024)  Housing: Low Risk  (02/22/2024)  Transportation Needs: No Transportation Needs (02/22/2024)  Utilities: Not At Risk (02/22/2024)  Alcohol Screen: Low Risk  (05/27/2023)  Depression (PHQ2-9): Low Risk  (10/24/2023)  Financial Resource Strain: Low Risk  (05/27/2023)  Physical Activity: Insufficiently Active (05/27/2023)  Social Connections: Moderately Integrated (02/22/2024)  Stress: No Stress Concern Present (05/27/2023)  Tobacco Use: Low Risk  (02/22/2024)  Health Literacy: Adequate Health Literacy (05/27/2023)     Readmission Risk Interventions     No data to display

## 2024-02-24 NOTE — Plan of Care (Signed)

## 2024-02-24 NOTE — Discharge Instructions (Signed)
 1)Avoid ibuprofen/Advil/Aleve/Motrin/Goody Powders/Naproxen/BC powders/Meloxicam/Diclofenac/Indomethacin and other Nonsteroidal anti-inflammatory medications as these will make you more likely to bleed and can cause stomach ulcers, can also cause Kidney problems.   2)Repeat CBC and BMP Blood Tests in 1 week  3)Short acting insulin  (Novolog ) per sliding scale 0-10 ---:-- Novolog  Insulin  injection 0-10 Units 0-10 Units Subcutaneous, 3 times daily with meals CBG 70 - 120: 0 unit CBG 121 - 150: 0 unit  CBG 151 - 200: 1 unit CBG 201 - 250: 2 units CBG 251 - 300: 4 units CBG 301 - 350: 6 units  CBG 351 - 400: 8 units  CBG > 400: 10 units

## 2024-02-24 NOTE — Discharge Summary (Signed)
 [name], is a 78 y.o. female  DOB 08/21/1945  MRN [id number].  Admission date:  02/22/2024  Admitting Physician  Rendall Carwin, MD  Discharge Date:  02/24/2024   Primary MD  Jolinda Norene HERO, DO  Recommendations for primary care physician for things to follow:  1)Avoid ibuprofen/Advil/Aleve/Motrin/Goody Powders/Naproxen/BC powders/Meloxicam/Diclofenac/Indomethacin and other Nonsteroidal anti-inflammatory medications as these will make you more likely to bleed and can cause stomach ulcers, can also cause Kidney problems.   2)Repeat CBC and BMP Blood Tests in 1 week  3)Short acting insulin  (Novolog ) per sliding scale 0-10 ---:-- Novolog  Insulin  injection 0-10 Units 0-10 Units Subcutaneous, 3 times daily with meals CBG 70 - 120: 0 unit CBG 121 - 150: 0 unit  CBG 151 - 200: 1 unit CBG 201 - 250: 2 units CBG 251 - 300: 4 units CBG 301 - 350: 6 units  CBG 351 - 400: 8 units  CBG > 400: 10 units  Admission Diagnosis  Falls [R29.6] Diarrhea, unspecified type [R19.7] Compression fracture of lumbar vertebra, unspecified lumbar vertebral level, initial encounter (HCC) [S32.000A]   Discharge Diagnosis  Falls [R29.6] Diarrhea, unspecified type [R19.7] Compression fracture of lumbar vertebra, unspecified lumbar vertebral level, initial encounter (HCC) [S32.000A]    Principal Problem:   Falls Active Problems:   Hypothyroidism   Type 2 diabetes mellitus with stage 3 chronic kidney disease, with long-term current use of insulin  (HCC)      Past Medical History:  Diagnosis Date   Anal fistula    Anemia in chronic renal disease    Aranesp  injection --  when Hg <11, last injection 12-24-14.   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    CKD (chronic kidney disease), stage III [facility])    nephrologist--  dr froylan-- LOV  07-09-2016   Complication of anesthesia    post-op confusion    Constipation    Coronary artery disease    Diabetic gastroparesis (HCC)    Diabetic retinopathy (HCC)    bilateral --  monitored by dr elner   Diverticulosis of colon    GAD (generalized anxiety disorder)    GERD (gastroesophageal reflux disease)    Hip fracture requiring operative repair (HCC)    History of colon polyps    benign   History of esophagitis    History of GI bleed    upper 2009  due to esophagitis  &  2002  due to Mallory-Weiss tear   History of hyperkalemia    pt had previously been canceled twice dos due to elevated K+, 07-29-2016 last date cancelled -- pt visited pcp same day treated w/ kayexelate and K+ came down, pt brought all her medication's in to  pcp office and found pt was taking losartan  that had been discontinued due to ckd, pcp stated this was cause of elevated K+   History of Mallory-Weiss syndrome    12/ 2002--  resolved   History of rectal abscess    12-29-2004  bedside I & D   Hyperlipidemia    Hypertension    Hypothyroidism    LAFB (left anterior fascicular block)    Right bundle branch block    Sacral decubitus ulcer    since 2014- 01-02-15 remains with wound gauze dressing changes daily.   Type II diabetes mellitus (HCC)     Past Surgical History:  Procedure Laterality Date   APPLICATION OF A-CELL OF EXTREMITY N/A 04/07/2015   Procedure: A CELL PLACMENT;  Surgeon: Estefana RAMAN Dillingham, DO;  Location: [facility] OR;  Service: Plastics;  Laterality: N/A;   CARDIOVASCULAR STRESS TEST  12/30/2004   normal perfusion study/  no ischemia or infartion/  normal LV wall function and wall motion , ef 66%   CATARACT EXTRACTION W/ INTRAOCULAR LENS  IMPLANT, BILATERAL   1995   COLONOSCOPY  2008   w/Dr.Brodie    COLONOSCOPY W/ POLYPECTOMY  last one 2008   COMPRESSION HIP SCREW Right 05/18/2014   Procedure: COMPRESSION HIP;  Surgeon: Taft FORBES Minerva, MD;  Location: [facility];  Service: Orthopedics;  Laterality: Right;   ESOPHAGOGASTRODUODENOSCOPY  last one 01-09-2011   EVALUATION UNDER ANESTHESIA WITH FISTULECTOMY N/A 01/06/2015   Procedure: EXAM UNDER ANESTHESIA , placement of seton;  Surgeon: Krystal Russell, MD;  Location: [facility];  Service: General;  Laterality: N/A;   I & D EXTREMITY N/A 04/07/2015   Procedure: IRRIGATION AND DEBRIDEMENT ISCHIAL ULCER;  Surgeon: Estefana RAMAN Dillingham, DO;  Location: [facility] OR;  Service: Plastics;  Laterality: N/A;   INCISION AND DRAINAGE OF WOUND N/A 09/30/2014   Procedure: IRRIGATION AND DEBRIDEMENT SACRAL WOUND, EXCISION OF PERIRECTAL TRACT WITH PLACEMENT OF ACCELL;  Surgeon: Estefana Reichert, DO;  Location: [facility];  Service: Plastics;  Laterality: N/A;   LIGATION OF INTERNAL FISTULA TRACT N/A 10/22/2016   Procedure: LIGATION OF INTERNAL FISTULA TRACT;  Surgeon: Debby Hila, MD;  Location: [facility];  Service: General;  Laterality: N/A;   ORIF FEMUR FRACTURE Left 10/09/2012   Procedure: OPEN REDUCTION INTERNAL FIXATION (ORIF) DISTAL FEMUR FRACTURE;  Surgeon: Ozell VEAR Bruch, MD;  Location: [facility];  Service: Orthopedics;  Laterality: Left;   RETINOPATHY SURGERY Bilateral 1980's?   SKIN CANCER EXCISION  03/2023   TRANSTHORACIC ECHOCARDIOGRAM  02/18/2011   mild LVH,  ef 55-60%,  grade I diastolic dysfunction/  mild TR/  RV systolic pressure increased consistant with moderate pulmonary hypertension     HPI  from the history and physical done on the day of admission:  HPI: [name] is a 78 y.o. female with medical history significant of  CKD IV, mild chronic anemia of CKD, gout, DM2, diabetic retinopathy and gastroparesis HTN and hypothyroidism who presents to the ED with with complaints  of back pain after recent fall with imaging study showing lumbar compression fracture. Patient apparently had a mechanical fall around 02/11/2024 at home, she was seen in the ED on 02/14/2024, Lumbar x-rays on 02/14/2024 showed L1 compression fracture - Patient returns to the ED on 02/22/2024 and patient reports increased back pain interfering with ability to ambulate and perform mobility related ADLs... Husband is elderly and daily with malignancy and unable to help at home No fever  Or chills ,  No Nausea, Vomiting or Diarrhea - No bowel or urinary incontinence--no red flag symptoms - Patient with some urinary frequency - UA suggestive of possible UTI - WBC 9.0 hemoglobin 11.5 which is close to prior baseline platelets 183 - Creatinine 1.97 which is not far from prior baseline potassium 5.0 sodium 135 GFR is 25, LFTs are not elevated   CT abdomen and pelvis without contrast on 02/22/2024 without acute findings, note is made of 5.2 x 4.4 cm well-defined relatively homogeneous left adrenal mass has attenuation too high to allow classification as an adenoma. This was present on the remote study from 16 years ago measuring 4.5 x 3.7 cm at that time. Given the relatively minimal change over such a long time interval, this is compatible with a benign etiology such as lipid poor adenoma. No followup imaging is recommended.   - CT lumbar spine on 02/22/2023 showed Superior endplate compression deformity at L1 appears nonacute by CT but is new since the 2016 exam. No posterior retropulsion of fracture fragments. 2. Stable compression deformities at L3 and L4. 3. Mild multifactorial central canal stenosis at L4-5.   Review of Systems: As mentioned in the history of present illness. All other systems reviewed and are negative.   Hospital Course:   Assessment and Plan: 1)Acute L 1 Compression Fracture with difficulty with pain control and limitations with mobility related ADLs-- -CT lumbar spine on  02/22/2023 showed Superior endplate compression deformity at L1 appears nonacute by CT but is new since the 2016 exam.  --No red flags at this time -initial injury 02/11/2024, seen in the ED on 02/14/2024 - Patient failed conservative management at home - Presents again on 02/22/2024 with difficulty with pain control and difficulties with mobility related ADLs -- Pain control improving with methocarbamol  and opiates -Physical therapy/OT Eval appreciated, PT/OT recommends SNF rehab Recurrent falls---PTA pt lived at home and did very poorly, patient has significant limitations with mobility related ADLs-    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 This was present on the remote study from 16 years ago measuring 4.5 x 3.7 cm at that time. Given the relatively minimal change over such a long time interval, this is compatible with a benign etiology such as lipid poor adenoma. No followup imaging is recommended   3)CKD stage -IV  -CKD IV with mild chronic anemia of CKD,  - Renal function currently stable - Creatinine currently around 2 which is not far from prior baseline  -Metabolic acidosis and hyperkalemia noted, bicarb and Lokelma prescribed, repeat BMP as outpatient within a week -- renally adjust medications, avoid nephrotoxic agents / dehydration  / hypotension   4)DM2, diabetic retinopathy and gastroparesis--- A1c 6.9 reflecting fair diabetic control PTA -Lantus 6 units nightly as ordered Use Novolog /Humalog Sliding scale insulin  with Accu-Cheks/Fingersticks as ordered    5)HTN--Stable, continue amlodipine , Coreg  and losartan    6) E. coli UTI--POA -Urine culture from 02/22/2024 with E. coli - No fevers or leukocytosis - Received Rocephin  in the ED - Okay to complete Keflex  pending final urine culture with sensitivity   7) hypothyroidism--continue levothyroxine     8)HLD--continue atorvastatin   9)Mild Hyperkalemia--- Johney as ordered repeat BMP as outpatient within a week  10)Mild Anemia------ may related to underlying CKD  --hemoglobin 11.5 which is close to prior baseline  - No acute bleeding concerns at this time   Disposition--Patient apparently had a mechanical fall around 02/11/2024 at  home, she was seen in the ED on 02/14/2024, Lumbar x-rays on 02/14/2024 showed L1 compression fracture - Patient returns to the ED on 02/22/2024 and patient reports increased back pain interfering with ability to ambulate and perform mobility related ADLs... Husband is elderly and daily with malignancy and unable to help at home No fever  Or chills ,  No Nausea, Vomiting or Diarrhea - No bowel or urinary incontinence--no red flag symptoms - She failed pain management at home after being discharged from the ED recently --Pain control improving with methocarbamol  and opiates - Okay to discharge to SNF rehab  Discharge Condition: stable  Follow UP   Contact information for after-discharge care     Destination     [facility] .   Service: Skilled Nursing Contact information: [address] [place]  [address] [phone]                     Diet and Activity recommendation:  As advised  Discharge Instructions    Discharge Instructions     Call MD for:  difficulty breathing, headache or visual disturbances   Complete by: As directed    Call MD for:  persistant dizziness or light-headedness   Complete by: As directed    Call MD for:  persistant nausea and vomiting   Complete by: As directed    Call MD for:  severe uncontrolled pain   Complete by: As directed    Call MD for:  temperature >100.4   Complete by: As directed    Diet - low sodium heart healthy   Complete by: As directed    Diet Carb Modified   Complete by: As directed    Discharge instructions   Complete by: As directed    1)Avoid  ibuprofen/Advil/Aleve/Motrin/Goody Powders/Naproxen/BC powders/Meloxicam/Diclofenac/Indomethacin and other Nonsteroidal anti-inflammatory medications as these will make you more likely to bleed and can cause stomach ulcers, can also cause Kidney problems.   2)Repeat CBC and BMP Blood Tests in 1 week  3)Short acting insulin  (Novolog ) per sliding scale 0-10 ---:-- Novolog  Insulin  injection 0-10 Units 0-10 Units Subcutaneous, 3 times daily with meals CBG 70 - 120: 0 unit CBG 121 - 150: 0 unit  CBG 151 - 200: 1 unit CBG 201 - 250: 2 units CBG 251 - 300: 4 units CBG 301 - 350: 6 units  CBG 351 - 400: 8 units  CBG > 400: 10 units   Increase activity slowly   Complete by: As directed          Discharge Medications     Allergies as of 02/24/2024       Reactions   Aspirin Nausea And Vomiting   Ciprofloxacin Other (See Comments)   Upset Stomach   Codeine Nausea And Vomiting   Makes me sick    Hydrocodone -acetaminophen     Other reaction(s): Unknown 09/14/22: Patient states that when she takes any type of pain medication she starts vomiting blood. States she was told not to take any OTC pain medication.   Losartan     Hyperkalemia   Micardis [telmisartan] Other (See Comments)   unknown   Nexium [esomeprazole Magnesium ] Other (See Comments)   Causes internal bleeding   Niaspan [niacin Er (antihyperlipidemic)] Other (See Comments)   Reaction is unknown   Nsaids    Other reaction(s): Unknown   Onglyza [saxagliptin ] Other (See Comments)   Reaction is unknown   Other    No otc pain medications   Oxycodone   Hcl    Other reaction(s): Unknown   Rofecoxib Other (See Comments)   unknown   Simvastatin    Tequin [gatifloxacin] Other (See Comments)   Reaction is unknown   Welchol [colesevelam Hcl]    Amoxicillin Nausea And Vomiting, Rash   Has patient had a PCN reaction causing immediate rash, facial/tongue/throat swelling, SOB or lightheadedness with hypotension: Yes Has patient had a PCN  reaction causing severe rash involving mucus membranes or skin necrosis: no Has patient had a PCN reaction that required hospitalization No Has patient had a PCN reaction occurring within the last 10 years: No If all of the above answers are NO, then may proceed with Cephalosporin use.        Medication List     STOP taking these medications    BASAGLAR  KWIKPEN [place] Replaced by: Basaglar  KwikPen 100 UNIT/ML       TAKE these medications    acetaminophen  325 MG tablet Commonly known as: TYLENOL  Take 2 tablets (650 mg total) by mouth every 6 (six) hours as needed for mild pain (pain score 1-3) or fever (or Fever >/= 101).   amLODipine  5 MG tablet Commonly known as: NORVASC  Take 1 tablet (5 mg total) by mouth daily.   atorvastatin  10 MG tablet Commonly known as: LIPITOR Take 1 tablet (10 mg total) by mouth daily.   Basaglar  KwikPen 100 UNIT/ML Inject 6 Units into the skin at bedtime. Replaces: BASAGLAR  KWIKPEN [place]   carvedilol  6.25 MG tablet Commonly known as: COREG  Take 1 tablet (6.25 mg total) by mouth 2 (two) times daily with a meal. What changed:  medication strength how much to take how to take this when to take this additional instructions   cephALEXin  500 MG capsule Commonly known as: KEFLEX  Take 1 capsule (500 mg total) by mouth 3 (three) times daily for 3 days.   HYDROcodone -acetaminophen  5-325 MG tablet Commonly known as: NORCO/VICODIN Take 1 tablet by mouth every 8 (eight) hours as needed.   insulin  aspart 100 UNIT/ML FlexPen Commonly known as: NOVOLOG  Inject 0-10 Units into the skin 3 (three) times daily with meals. Short acting insulin  per sliding scale 0-10 ---:-- Insulin  injection 0-10 Units 0-10 Units Subcutaneous, 3 times daily with meals CBG 70 - 120: 0 unit CBG 121 - 150: 0 unit  CBG 151 - 200: 1 unit CBG 201 - 250: 2 units CBG 251 - 300: 4 units CBG 301 - 350: 6 units  CBG 351 - 400: 8 units  CBG > 400: 10 units   ketorolac  0.5 % ophthalmic  solution Commonly known as: ACULAR  Place 1 drop into the right eye 2 (two) times daily.   levothyroxine  50 MCG tablet Commonly known as: SYNTHROID  Take 1 tablet (50 mcg total) by mouth daily before breakfast.   Lokelma 10 g Pack packet Generic drug: sodium zirconium cyclosilicate Take 10 g by mouth daily for 2 days. Start taking on: February 25, 2024   losartan  50 MG tablet Commonly known as: COZAAR  Take 1 tablet (50 mg total) by mouth daily. What changed:  medication strength See the new instructions.   methocarbamol  500 MG tablet Commonly known as: ROBAXIN  Take 1 tablet (500 mg total) by mouth 3 (three) times daily for 10 days.   senna-docusate 8.6-50 MG tablet Commonly known as: Senokot-S Take 2 tablets by mouth at bedtime.   sodium bicarbonate 650 MG tablet Take 1 tablet (650 mg total) by mouth 2 (two) times daily.   traZODone  50 MG tablet Commonly known  as: DESYREL  Take 1 tablet (50 mg total) by mouth at bedtime as needed for sleep.       Major procedures and Radiology Reports - PLEASE review detailed and final reports for all details, in brief -   CT L-SPINE NO CHARGE Result Date: 02/22/2024 CLINICAL DATA:  Back pain. History of recent lumbar spine compression fracture. EXAM: CT LUMBAR SPINE WITHOUT CONTRAST TECHNIQUE: Multidetector CT imaging of the lumbar spine was performed without intravenous contrast administration. Multiplanar CT image reconstructions were also generated. RADIATION DOSE REDUCTION: This exam was performed according to the departmental dose-optimization program which includes automated exposure control, adjustment of the mA and/or kV according to patient size and/or use of iterative reconstruction technique. COMPARISON:  Lumbar spine x-rays 02/14/2024. Lumbar spine MRI 12/26/2014. FINDINGS: Segmentation: 5 lumbar type vertebrae. Alignment: Normal. Vertebrae: Superior endplate compression deformity is noted at L1, L3, and L4 the compression deformities  at L3 and L4 are stable to the remote lumbar spine MRI. Compression deformity at L1 appears nonacute by CT but is new since the 2016 exam. Deformity at the L1 level results in approximately 25% loss of height anteriorly with less than 25% loss of height posteriorly. No posterior retropulsion of fracture fragments. Paraspinal and other soft tissues: Left adrenal mass was characterized on CT abdomen and pelvis performed at the same time and reported separately. Disc levels: T12-L1: No central canal or foraminal stenosis. L1-2: No central canal or foraminal stenosis. L2-3: Degenerative disc disease. No central canal stenosis. Neural foramina are patent bilaterally. L3-4: No central canal or foraminal stenosis. L4-5: Degenerative loss of disc height. Mild multifactorial central canal stenosis. Mild foraminal encroachment secondary to facet overgrowth. L5-S1: Unremarkable. IMPRESSION: 1. Superior endplate compression deformity at L1 appears nonacute by CT but is new since the 2016 exam. No posterior retropulsion of fracture fragments. 2. Stable compression deformities at L3 and L4. 3. Mild multifactorial central canal stenosis at L4-5. 4. Left adrenal mass was characterized on CT abdomen and pelvis performed at the same time and reported separately. Electronically Signed   By: Camellia Candle M.D.   On: 02/22/2024 06:55   CT ABDOMEN PELVIS WO CONTRAST Result Date: 02/22/2024 CLINICAL DATA:  Diarrhea. Chronic kidney disease. Recent lumbar compression fracture. Back pain. EXAM: CT ABDOMEN AND PELVIS WITHOUT CONTRAST TECHNIQUE: Multidetector CT imaging of the abdomen and pelvis was performed following the standard protocol without IV contrast. RADIATION DOSE REDUCTION: This exam was performed according to the departmental dose-optimization program which includes automated exposure control, adjustment of the mA and/or kV according to patient size and/or use of iterative reconstruction technique. COMPARISON:  Abdomen CT  01/23/2008. FINDINGS: Lower chest: Asymmetric elevation right hemidiaphragm with right base atelectasis or scarring. Hepatobiliary: No suspicious focal abnormality in the liver on this study without intravenous contrast. There is no evidence for gallstones, gallbladder wall thickening, or pericholecystic fluid. No intrahepatic or extrahepatic biliary dilation. Pancreas: No focal mass lesion. No dilatation of the main duct. No intraparenchymal cyst. No peripancreatic edema. Spleen: No splenomegaly. No suspicious focal mass lesion. Adrenals/Urinary Tract: Right adrenal gland unremarkable.5.2 x 4.4 cm well-defined relatively homogeneous left adrenal mass has attenuation too high to allow classification as an adenoma. This was present on the remote study from 16 years ago measuring 4.5 x 3.7 cm at that time. This lesion was also evaluated by MRI in 2009. Given the relatively minimal change over such a long time., this is compatible with a benign etiology such as lipid poor adenoma. Both kidneys are atrophic  without hydronephrosis. No evidence for hydroureter. The urinary bladder appears normal for the degree of distention. Stomach/Bowel: Stomach is unremarkable. No gastric wall thickening. No evidence of outlet obstruction. Duodenum is normally positioned as is the ligament of Treitz. Duodenal diverticulum noted. No small bowel wall thickening. No small bowel dilatation. The terminal ileum is normal. The appendix is normal. No gross colonic mass. No colonic wall thickening. Diverticular changes are noted in the left colon without evidence of diverticulitis. Vascular/Lymphatic: There is moderate atherosclerotic calcification of the abdominal aorta without aneurysm. There is no gastrohepatic or hepatoduodenal ligament lymphadenopathy. No retroperitoneal or mesenteric lymphadenopathy. No pelvic sidewall lymphadenopathy. Reproductive: The uterus is unremarkable.  There is no adnexal mass. Other: No intraperitoneal free  fluid. Musculoskeletal: Bones are diffusely demineralized. Fixation hardware noted proximal right femur. No worrisome lytic or sclerotic osseous abnormality. See report for dedicated lumbar spine CT dictated separately. IMPRESSION: 1. No acute findings in the abdomen or pelvis. Specifically, no findings to explain the patient's history of diarrhea. 2. 5.2 x 4.4 cm well-defined relatively homogeneous left adrenal mass has attenuation too high to allow classification as an adenoma. This was present on the remote study from 16 years ago measuring 4.5 x 3.7 cm at that time. Given the relatively minimal change over such a long time interval, this is compatible with a benign etiology such as lipid poor adenoma. No followup imaging is recommended. 3. Left colonic diverticulosis without diverticulitis. 4.  Aortic Atherosclerosis (ICD10-I70.0). Electronically Signed   By: Camellia Candle M.D.   On: 02/22/2024 06:49   DG Lumbar Spine 2-3 Views Result Date: 02/14/2024 CLINICAL DATA:  Fall. EXAM: LUMBAR SPINE - 2-3 VIEW COMPARISON:  Lumbar spine x-ray 12/05/2014. FINDINGS: Mild compression deformity of the superior endplate of L1 is new from 2016. Compression deformities of L3 and L4 have not significantly change. Spinal alignment is normal. There is moderate disc space narrowing at L5-S1. There is severe disc space narrowing at L2-L3. The bones are markedly osteopenic. Right-sided hip screw is present. There severe atherosclerotic calcifications of the aorta. IMPRESSION: 1. Mild compression deformity of the superior endplate of L1 is new from 2016. Correlate clinically for acuity. 2. Compression deformities of L3 and L4 have not significantly change. 3. Marked osteopenia. Electronically Signed   By: Greig Pique M.D.   On: 02/14/2024 17:16    Micro Results   Recent Results (from the past 240 hours)  Urine Culture     Status: Abnormal (Preliminary result)   Collection Time: 02/22/24  6:31 AM   Specimen: Urine, Clean  Catch  Result Value Ref Range Status   Specimen Description   Final    URINE, CLEAN CATCH Performed at [facility], [address], [place], [place] [address]    Special Requests   Final    NONE Performed at [facility], [address] [place], [place] [address]    Culture (A)  Final    90,000 COLONIES/mL ESCHERICHIA COLI SUSCEPTIBILITIES TO FOLLOW CULTURE REINCUBATED FOR BETTER GROWTH Performed at [facility], [address], [place], [place] [address]    Report Status PENDING  Incomplete    Today   Subjective    [name] today has no new concerns  No fever  Or chills   No Nausea, Vomiting or Diarrhea -  Patient has been seen and examined prior to discharge   Objective   Blood pressure (!) 165/63, pulse (!) 56, temperature 98.2 F (36.8 C), temperature source Oral, resp. rate 20,  height 5' 1 (1.549 m), weight 66.7 kg, SpO2 98%.  Exam Gen:- Awake Alert, no acute distress  HEENT:- [place].AT, No sclera icterus Neck-Supple Neck,No JVD,.  Lungs-  CTAB , good air movement bilaterally CV- S1, S2 normal, regular Abd-  +ve B.Sounds, Abd Soft, No tenderness,  No CVA area tenderness Extremity/Skin:- No  edema,   good pulses Psych-affect is appropriate, oriented x3 Neuro-Generalized weakness, Mobility related difficulties, however no new focal deficits, no tremors MSK--discomfort over upper lumbar area with palpation and with positional change/range of motion--overlying skin without erythema swelling or obvious skin changes   Data Review   CBC w Diff:  Lab Results  Component Value Date   WBC 9.0 02/22/2024   HGB 11.5 (L) 02/22/2024   HGB 11.4 12/03/2022   HCT 34.2 (L) 02/22/2024   HCT 34.3 12/03/2022   PLT 183 02/22/2024   PLT 150 12/03/2022   LYMPHOPCT 20 02/22/2024   MONOPCT 6 02/22/2024   EOSPCT 4 02/22/2024   BASOPCT 1 02/22/2024   CMP:  Lab Results  Component Value Date   NA 136 02/24/2024   NA 140 02/13/2024   K 5.2 (H) 02/24/2024   CL 105  02/24/2024   CO2 19 (L) 02/24/2024   BUN 33 (H) 02/24/2024   BUN 30 (H) 02/13/2024   CREATININE 2.01 (H) 02/24/2024   CREATININE 1.70 (H) 09/11/2012   PROT 7.1 02/22/2024   PROT 6.4 02/13/2024   ALBUMIN 3.3 (L) 02/24/2024   ALBUMIN 3.9 02/13/2024   BILITOT 0.5 02/22/2024   BILITOT 0.6 02/13/2024   ALKPHOS 153 (H) 02/22/2024   AST 15 02/22/2024   ALT <5 02/22/2024   Total Discharge time is about 33 minutes  Rendall Carwin M.D on 02/24/2024 at 10:45 AM  Go to [email or web address] -  for contact info  [facility] - Office  [phone]

## 2024-02-24 NOTE — Plan of Care (Signed)

## 2024-02-25 LAB — URINE CULTURE: Culture: 90000 — AB

## 2024-02-26 DIAGNOSIS — R296 Repeated falls: Secondary | ICD-10-CM | POA: Diagnosis not present

## 2024-02-26 DIAGNOSIS — R531 Weakness: Secondary | ICD-10-CM | POA: Diagnosis not present

## 2024-02-26 DIAGNOSIS — R197 Diarrhea, unspecified: Secondary | ICD-10-CM | POA: Diagnosis not present

## 2024-02-26 DIAGNOSIS — S32050A Wedge compression fracture of fifth lumbar vertebra, initial encounter for closed fracture: Secondary | ICD-10-CM | POA: Diagnosis not present

## 2024-02-28 ENCOUNTER — Ambulatory Visit: Admitting: "Endocrinology

## 2024-03-02 ENCOUNTER — Ambulatory Visit: Admitting: Family Medicine

## 2024-03-08 ENCOUNTER — Other Ambulatory Visit: Payer: Self-pay | Admitting: "Endocrinology

## 2024-03-08 DIAGNOSIS — E034 Atrophy of thyroid (acquired): Secondary | ICD-10-CM

## 2024-03-12 DIAGNOSIS — S32050A Wedge compression fracture of fifth lumbar vertebra, initial encounter for closed fracture: Secondary | ICD-10-CM | POA: Diagnosis not present

## 2024-03-12 DIAGNOSIS — R531 Weakness: Secondary | ICD-10-CM | POA: Diagnosis not present

## 2024-03-12 DIAGNOSIS — R197 Diarrhea, unspecified: Secondary | ICD-10-CM | POA: Diagnosis not present

## 2024-03-12 DIAGNOSIS — R296 Repeated falls: Secondary | ICD-10-CM | POA: Diagnosis not present

## 2024-03-13 DIAGNOSIS — D3502 Benign neoplasm of left adrenal gland: Secondary | ICD-10-CM | POA: Diagnosis not present

## 2024-03-13 DIAGNOSIS — Z9181 History of falling: Secondary | ICD-10-CM | POA: Diagnosis not present

## 2024-03-13 DIAGNOSIS — K3184 Gastroparesis: Secondary | ICD-10-CM | POA: Diagnosis not present

## 2024-03-13 DIAGNOSIS — M1990 Unspecified osteoarthritis, unspecified site: Secondary | ICD-10-CM | POA: Diagnosis not present

## 2024-03-13 DIAGNOSIS — N390 Urinary tract infection, site not specified: Secondary | ICD-10-CM | POA: Diagnosis not present

## 2024-03-13 DIAGNOSIS — E039 Hypothyroidism, unspecified: Secondary | ICD-10-CM | POA: Diagnosis not present

## 2024-03-13 DIAGNOSIS — E11319 Type 2 diabetes mellitus with unspecified diabetic retinopathy without macular edema: Secondary | ICD-10-CM | POA: Diagnosis not present

## 2024-03-13 DIAGNOSIS — E782 Mixed hyperlipidemia: Secondary | ICD-10-CM | POA: Diagnosis not present

## 2024-03-13 DIAGNOSIS — I2510 Atherosclerotic heart disease of native coronary artery without angina pectoris: Secondary | ICD-10-CM | POA: Diagnosis not present

## 2024-03-13 DIAGNOSIS — R296 Repeated falls: Secondary | ICD-10-CM | POA: Diagnosis not present

## 2024-03-13 DIAGNOSIS — F411 Generalized anxiety disorder: Secondary | ICD-10-CM | POA: Diagnosis not present

## 2024-03-13 DIAGNOSIS — I129 Hypertensive chronic kidney disease with stage 1 through stage 4 chronic kidney disease, or unspecified chronic kidney disease: Secondary | ICD-10-CM | POA: Diagnosis not present

## 2024-03-13 DIAGNOSIS — N1831 Chronic kidney disease, stage 3a: Secondary | ICD-10-CM | POA: Diagnosis not present

## 2024-03-13 DIAGNOSIS — S32009D Unspecified fracture of unspecified lumbar vertebra, subsequent encounter for fracture with routine healing: Secondary | ICD-10-CM | POA: Diagnosis not present

## 2024-03-13 DIAGNOSIS — E1122 Type 2 diabetes mellitus with diabetic chronic kidney disease: Secondary | ICD-10-CM | POA: Diagnosis not present

## 2024-03-13 DIAGNOSIS — E1143 Type 2 diabetes mellitus with diabetic autonomic (poly)neuropathy: Secondary | ICD-10-CM | POA: Diagnosis not present

## 2024-03-13 DIAGNOSIS — D631 Anemia in chronic kidney disease: Secondary | ICD-10-CM | POA: Diagnosis not present

## 2024-03-13 DIAGNOSIS — M109 Gout, unspecified: Secondary | ICD-10-CM | POA: Diagnosis not present

## 2024-03-13 DIAGNOSIS — H548 Legal blindness, as defined in USA: Secondary | ICD-10-CM | POA: Diagnosis not present

## 2024-03-13 DIAGNOSIS — E875 Hyperkalemia: Secondary | ICD-10-CM | POA: Diagnosis not present

## 2024-03-13 DIAGNOSIS — Z556 Problems related to health literacy: Secondary | ICD-10-CM | POA: Diagnosis not present

## 2024-03-13 DIAGNOSIS — E1140 Type 2 diabetes mellitus with diabetic neuropathy, unspecified: Secondary | ICD-10-CM | POA: Diagnosis not present

## 2024-03-15 DIAGNOSIS — Z9181 History of falling: Secondary | ICD-10-CM | POA: Diagnosis not present

## 2024-03-15 DIAGNOSIS — D631 Anemia in chronic kidney disease: Secondary | ICD-10-CM | POA: Diagnosis not present

## 2024-03-15 DIAGNOSIS — E11319 Type 2 diabetes mellitus with unspecified diabetic retinopathy without macular edema: Secondary | ICD-10-CM | POA: Diagnosis not present

## 2024-03-15 DIAGNOSIS — E1140 Type 2 diabetes mellitus with diabetic neuropathy, unspecified: Secondary | ICD-10-CM | POA: Diagnosis not present

## 2024-03-15 DIAGNOSIS — E039 Hypothyroidism, unspecified: Secondary | ICD-10-CM | POA: Diagnosis not present

## 2024-03-15 DIAGNOSIS — S32009D Unspecified fracture of unspecified lumbar vertebra, subsequent encounter for fracture with routine healing: Secondary | ICD-10-CM | POA: Diagnosis not present

## 2024-03-15 DIAGNOSIS — E1122 Type 2 diabetes mellitus with diabetic chronic kidney disease: Secondary | ICD-10-CM | POA: Diagnosis not present

## 2024-03-15 DIAGNOSIS — E875 Hyperkalemia: Secondary | ICD-10-CM | POA: Diagnosis not present

## 2024-03-15 DIAGNOSIS — F411 Generalized anxiety disorder: Secondary | ICD-10-CM | POA: Diagnosis not present

## 2024-03-15 DIAGNOSIS — H548 Legal blindness, as defined in USA: Secondary | ICD-10-CM | POA: Diagnosis not present

## 2024-03-15 DIAGNOSIS — M109 Gout, unspecified: Secondary | ICD-10-CM | POA: Diagnosis not present

## 2024-03-15 DIAGNOSIS — I2510 Atherosclerotic heart disease of native coronary artery without angina pectoris: Secondary | ICD-10-CM | POA: Diagnosis not present

## 2024-03-15 DIAGNOSIS — E782 Mixed hyperlipidemia: Secondary | ICD-10-CM | POA: Diagnosis not present

## 2024-03-15 DIAGNOSIS — R296 Repeated falls: Secondary | ICD-10-CM | POA: Diagnosis not present

## 2024-03-15 DIAGNOSIS — Z556 Problems related to health literacy: Secondary | ICD-10-CM | POA: Diagnosis not present

## 2024-03-15 DIAGNOSIS — K3184 Gastroparesis: Secondary | ICD-10-CM | POA: Diagnosis not present

## 2024-03-15 DIAGNOSIS — M1990 Unspecified osteoarthritis, unspecified site: Secondary | ICD-10-CM | POA: Diagnosis not present

## 2024-03-15 DIAGNOSIS — E1143 Type 2 diabetes mellitus with diabetic autonomic (poly)neuropathy: Secondary | ICD-10-CM | POA: Diagnosis not present

## 2024-03-15 DIAGNOSIS — N1831 Chronic kidney disease, stage 3a: Secondary | ICD-10-CM | POA: Diagnosis not present

## 2024-03-15 DIAGNOSIS — N390 Urinary tract infection, site not specified: Secondary | ICD-10-CM | POA: Diagnosis not present

## 2024-03-15 DIAGNOSIS — D3502 Benign neoplasm of left adrenal gland: Secondary | ICD-10-CM | POA: Diagnosis not present

## 2024-03-15 DIAGNOSIS — I129 Hypertensive chronic kidney disease with stage 1 through stage 4 chronic kidney disease, or unspecified chronic kidney disease: Secondary | ICD-10-CM | POA: Diagnosis not present

## 2024-03-20 ENCOUNTER — Inpatient Hospital Stay: Admitting: Family Medicine

## 2024-03-20 ENCOUNTER — Telehealth: Payer: Self-pay

## 2024-03-20 DIAGNOSIS — I129 Hypertensive chronic kidney disease with stage 1 through stage 4 chronic kidney disease, or unspecified chronic kidney disease: Secondary | ICD-10-CM | POA: Diagnosis not present

## 2024-03-20 DIAGNOSIS — E782 Mixed hyperlipidemia: Secondary | ICD-10-CM | POA: Diagnosis not present

## 2024-03-20 DIAGNOSIS — E1140 Type 2 diabetes mellitus with diabetic neuropathy, unspecified: Secondary | ICD-10-CM | POA: Diagnosis not present

## 2024-03-20 DIAGNOSIS — I2510 Atherosclerotic heart disease of native coronary artery without angina pectoris: Secondary | ICD-10-CM | POA: Diagnosis not present

## 2024-03-20 DIAGNOSIS — N1831 Chronic kidney disease, stage 3a: Secondary | ICD-10-CM | POA: Diagnosis not present

## 2024-03-20 DIAGNOSIS — E11319 Type 2 diabetes mellitus with unspecified diabetic retinopathy without macular edema: Secondary | ICD-10-CM | POA: Diagnosis not present

## 2024-03-20 DIAGNOSIS — H548 Legal blindness, as defined in USA: Secondary | ICD-10-CM | POA: Diagnosis not present

## 2024-03-20 DIAGNOSIS — E875 Hyperkalemia: Secondary | ICD-10-CM | POA: Diagnosis not present

## 2024-03-20 DIAGNOSIS — M1990 Unspecified osteoarthritis, unspecified site: Secondary | ICD-10-CM | POA: Diagnosis not present

## 2024-03-20 DIAGNOSIS — R296 Repeated falls: Secondary | ICD-10-CM | POA: Diagnosis not present

## 2024-03-20 DIAGNOSIS — Z9181 History of falling: Secondary | ICD-10-CM | POA: Diagnosis not present

## 2024-03-20 DIAGNOSIS — Z556 Problems related to health literacy: Secondary | ICD-10-CM | POA: Diagnosis not present

## 2024-03-20 DIAGNOSIS — D3502 Benign neoplasm of left adrenal gland: Secondary | ICD-10-CM | POA: Diagnosis not present

## 2024-03-20 DIAGNOSIS — E039 Hypothyroidism, unspecified: Secondary | ICD-10-CM | POA: Diagnosis not present

## 2024-03-20 DIAGNOSIS — K3184 Gastroparesis: Secondary | ICD-10-CM | POA: Diagnosis not present

## 2024-03-20 DIAGNOSIS — E1143 Type 2 diabetes mellitus with diabetic autonomic (poly)neuropathy: Secondary | ICD-10-CM | POA: Diagnosis not present

## 2024-03-20 DIAGNOSIS — D631 Anemia in chronic kidney disease: Secondary | ICD-10-CM | POA: Diagnosis not present

## 2024-03-20 DIAGNOSIS — F411 Generalized anxiety disorder: Secondary | ICD-10-CM | POA: Diagnosis not present

## 2024-03-20 DIAGNOSIS — S32009D Unspecified fracture of unspecified lumbar vertebra, subsequent encounter for fracture with routine healing: Secondary | ICD-10-CM | POA: Diagnosis not present

## 2024-03-20 DIAGNOSIS — N390 Urinary tract infection, site not specified: Secondary | ICD-10-CM | POA: Diagnosis not present

## 2024-03-20 DIAGNOSIS — E1122 Type 2 diabetes mellitus with diabetic chronic kidney disease: Secondary | ICD-10-CM | POA: Diagnosis not present

## 2024-03-20 DIAGNOSIS — M109 Gout, unspecified: Secondary | ICD-10-CM | POA: Diagnosis not present

## 2024-03-20 NOTE — Telephone Encounter (Signed)
 Copied from CRM [id number]. Topic: Clinical - Medical Advice >> Mar 20, 2024  9:04 AM Gustabo D wrote: [facility] says that Pt wants a bath aide to come in. [phone] Fine to leave vm.

## 2024-03-20 NOTE — Telephone Encounter (Signed)
 Called and left verbal for the aid.

## 2024-03-21 DIAGNOSIS — N390 Urinary tract infection, site not specified: Secondary | ICD-10-CM | POA: Diagnosis not present

## 2024-03-21 DIAGNOSIS — N1831 Chronic kidney disease, stage 3a: Secondary | ICD-10-CM | POA: Diagnosis not present

## 2024-03-21 DIAGNOSIS — E1122 Type 2 diabetes mellitus with diabetic chronic kidney disease: Secondary | ICD-10-CM | POA: Diagnosis not present

## 2024-03-21 DIAGNOSIS — I129 Hypertensive chronic kidney disease with stage 1 through stage 4 chronic kidney disease, or unspecified chronic kidney disease: Secondary | ICD-10-CM | POA: Diagnosis not present

## 2024-03-21 DIAGNOSIS — D631 Anemia in chronic kidney disease: Secondary | ICD-10-CM | POA: Diagnosis not present

## 2024-03-21 DIAGNOSIS — E782 Mixed hyperlipidemia: Secondary | ICD-10-CM | POA: Diagnosis not present

## 2024-03-21 DIAGNOSIS — F411 Generalized anxiety disorder: Secondary | ICD-10-CM | POA: Diagnosis not present

## 2024-03-21 DIAGNOSIS — E1140 Type 2 diabetes mellitus with diabetic neuropathy, unspecified: Secondary | ICD-10-CM | POA: Diagnosis not present

## 2024-03-21 DIAGNOSIS — Z556 Problems related to health literacy: Secondary | ICD-10-CM | POA: Diagnosis not present

## 2024-03-21 DIAGNOSIS — R296 Repeated falls: Secondary | ICD-10-CM | POA: Diagnosis not present

## 2024-03-21 DIAGNOSIS — K3184 Gastroparesis: Secondary | ICD-10-CM | POA: Diagnosis not present

## 2024-03-21 DIAGNOSIS — M109 Gout, unspecified: Secondary | ICD-10-CM | POA: Diagnosis not present

## 2024-03-21 DIAGNOSIS — I2510 Atherosclerotic heart disease of native coronary artery without angina pectoris: Secondary | ICD-10-CM | POA: Diagnosis not present

## 2024-03-21 DIAGNOSIS — H548 Legal blindness, as defined in USA: Secondary | ICD-10-CM | POA: Diagnosis not present

## 2024-03-21 DIAGNOSIS — E11319 Type 2 diabetes mellitus with unspecified diabetic retinopathy without macular edema: Secondary | ICD-10-CM | POA: Diagnosis not present

## 2024-03-21 DIAGNOSIS — E1143 Type 2 diabetes mellitus with diabetic autonomic (poly)neuropathy: Secondary | ICD-10-CM | POA: Diagnosis not present

## 2024-03-21 DIAGNOSIS — E039 Hypothyroidism, unspecified: Secondary | ICD-10-CM | POA: Diagnosis not present

## 2024-03-21 DIAGNOSIS — S32009D Unspecified fracture of unspecified lumbar vertebra, subsequent encounter for fracture with routine healing: Secondary | ICD-10-CM | POA: Diagnosis not present

## 2024-03-21 DIAGNOSIS — M1990 Unspecified osteoarthritis, unspecified site: Secondary | ICD-10-CM | POA: Diagnosis not present

## 2024-03-21 DIAGNOSIS — E875 Hyperkalemia: Secondary | ICD-10-CM | POA: Diagnosis not present

## 2024-03-21 DIAGNOSIS — D3502 Benign neoplasm of left adrenal gland: Secondary | ICD-10-CM | POA: Diagnosis not present

## 2024-03-21 DIAGNOSIS — Z9181 History of falling: Secondary | ICD-10-CM | POA: Diagnosis not present

## 2024-03-23 ENCOUNTER — Ambulatory Visit (INDEPENDENT_AMBULATORY_CARE_PROVIDER_SITE_OTHER): Admitting: Family Medicine

## 2024-03-23 ENCOUNTER — Encounter: Payer: Self-pay | Admitting: Family Medicine

## 2024-03-23 VITALS — BP 105/52 | HR 55 | Temp 98.1°F | Ht 61.0 in | Wt 138.0 lb

## 2024-03-23 DIAGNOSIS — L97411 Non-pressure chronic ulcer of right heel and midfoot limited to breakdown of skin: Secondary | ICD-10-CM | POA: Diagnosis not present

## 2024-03-23 DIAGNOSIS — E1159 Type 2 diabetes mellitus with other circulatory complications: Secondary | ICD-10-CM

## 2024-03-23 DIAGNOSIS — E1122 Type 2 diabetes mellitus with diabetic chronic kidney disease: Secondary | ICD-10-CM | POA: Diagnosis not present

## 2024-03-23 DIAGNOSIS — S32010D Wedge compression fracture of first lumbar vertebra, subsequent encounter for fracture with routine healing: Secondary | ICD-10-CM

## 2024-03-23 DIAGNOSIS — I152 Hypertension secondary to endocrine disorders: Secondary | ICD-10-CM

## 2024-03-23 DIAGNOSIS — M2550 Pain in unspecified joint: Secondary | ICD-10-CM

## 2024-03-23 DIAGNOSIS — Z7409 Other reduced mobility: Secondary | ICD-10-CM

## 2024-03-23 DIAGNOSIS — E1169 Type 2 diabetes mellitus with other specified complication: Secondary | ICD-10-CM

## 2024-03-23 DIAGNOSIS — Z794 Long term (current) use of insulin: Secondary | ICD-10-CM

## 2024-03-23 DIAGNOSIS — N1831 Chronic kidney disease, stage 3a: Secondary | ICD-10-CM

## 2024-03-23 DIAGNOSIS — E785 Hyperlipidemia, unspecified: Secondary | ICD-10-CM

## 2024-03-23 MED ORDER — LOSARTAN POTASSIUM 25 MG PO TABS: 25.0000 mg | ORAL_TABLET | Freq: Every day | ORAL | 3 refills | Status: AC

## 2024-03-23 NOTE — Progress Notes (Signed)
 Subjective: CC: Follow-up nursing home PCP: Jolinda Norene HERO, DO YEP:Yzozw H Caccavale is a 78 y.o. female presenting to clinic today for:  Patient was transition from hospital to nursing home after having had compression fracture with uncontrolled pain.  She has undergone kyphoplasty and is now back home with home health physical therapy coming out twice per week.  Utilizing a walker most times but is asking for a prescription for a transport wheelchair as she is not able to ambulate very far distances without feeling unstable and a cane that has 1 prong and this was recommended by her home health physical therapist.  She has a spot on the back of her right foot that she would like me to look at today.  Continues to follow-up with Dr. Lenis and her cardiologist as directed along with her renal specialist.  Her blood pressure has been running low since getting home and she is wondering if maybe we need to back down on one of her blood pressure medications.  Does not report any falls or dizziness.   ROS: Per HPI  Allergies  Allergen Reactions   Aspirin Nausea And Vomiting   Ciprofloxacin Other (See Comments)    Upset Stomach   Codeine Nausea And Vomiting    Makes me sick    Hydrocodone -Acetaminophen      Other reaction(s): Unknown  09/14/22: Patient states that when she takes any type of pain medication she starts vomiting blood. States she was told not to take any OTC pain medication.   Losartan      Hyperkalemia    Micardis [Telmisartan] Other (See Comments)    unknown   Nexium [Esomeprazole Magnesium ] Other (See Comments)    Causes internal bleeding   Niaspan [Niacin Er (Antihyperlipidemic)] Other (See Comments)    Reaction is unknown   Nsaids     Other reaction(s): Unknown   Onglyza [Saxagliptin ] Other (See Comments)    Reaction is unknown   Other     No otc pain medications   Oxycodone  Hcl     Other reaction(s): Unknown   Rofecoxib Other (See Comments)    unknown    Simvastatin    Tequin [Gatifloxacin] Other (See Comments)    Reaction is unknown   Welchol [Colesevelam Hcl]    Amoxicillin Nausea And Vomiting and Rash    Has patient had a PCN reaction causing immediate rash, facial/tongue/throat swelling, SOB or lightheadedness with hypotension: Yes Has patient had a PCN reaction causing severe rash involving mucus membranes or skin necrosis: no  Has patient had a PCN reaction that required hospitalization No Has patient had a PCN reaction occurring within the last 10 years: No If all of the above answers are NO, then may proceed with Cephalosporin use.    Past Medical History:  Diagnosis Date   Anal fistula    Anemia in chronic renal disease    Aranesp  injection --  when Hg <11, last injection 12-24-14.   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    CKD (chronic kidney disease), stage III [facility])    nephrologist--  dr froylan-- LOV  07-09-2016   Complication of anesthesia    post-op  confusion    Constipation    Coronary artery disease    Diabetic gastroparesis (HCC)    Diabetic retinopathy (HCC)    bilateral --  monitored by dr elner   Diverticulosis of colon    GAD (generalized anxiety disorder)    GERD (gastroesophageal reflux disease)    Hip fracture requiring operative repair [facility])    History of colon polyps    benign   History of esophagitis    History of GI bleed    upper 2009  due to esophagitis  &  2002  due to Mallory-Weiss tear   History of hyperkalemia    pt had previously been canceled twice dos due to elevated K+, 07-29-2016 last date cancelled -- pt visited pcp same day treated w/ kayexelate and K+ came down, pt  brought all her medication's in to pcp office and found pt was taking losartan  that had been discontinued due to ckd, pcp stated this was cause of elevated K+   History of Mallory-Weiss syndrome    12/ 2002--  resolved   History of rectal abscess    12-29-2004  bedside I & D   Hyperlipidemia    Hypertension    Hypothyroidism    LAFB (left anterior fascicular block)    Right bundle branch block    Sacral decubitus ulcer    since 2014- 01-02-15 remains with wound gauze dressing changes daily.   Type II diabetes mellitus (HCC)     Current Outpatient Medications:    acetaminophen  (TYLENOL ) 325 MG tablet, Take 2 tablets (650 mg total) by mouth every 6 (six) hours as needed for mild pain (pain score 1-3) or fever (or Fever >/= 101)., Disp: , Rfl:    amLODipine  (NORVASC ) 5 MG tablet, Take 1 tablet (5 mg total) by mouth daily., Disp: 90 tablet, Rfl: 3   atorvastatin  (LIPITOR) 10 MG tablet, Take 1 tablet (10 mg total) by mouth daily., Disp: 90 tablet, Rfl: 3   carvedilol  (COREG ) 6.25 MG tablet, Take 1 tablet (6.25 mg total) by mouth 2 (two) times daily with a meal., Disp: 60 tablet, Rfl: 5   insulin  aspart (NOVOLOG ) 100 UNIT/ML FlexPen, Inject 0-10 Units into the skin 3 (three) times daily with meals. Short acting insulin  per sliding scale 0-10 ---:-- Insulin  injection 0-10 Units 0-10 Units Subcutaneous, 3 times daily with meals CBG 70 - 120: 0 unit CBG 121 - 150: 0 unit  CBG 151 - 200: 1 unit CBG 201 - 250: 2 units CBG 251 - 300: 4 units CBG 301 - 350: 6 units  CBG 351 - 400: 8 units  CBG > 400: 10 units, Disp: 15 mL, Rfl: 11   Insulin  Glargine (BASAGLAR  KWIKPEN) 100 UNIT/ML, Inject 6 Units into the skin at bedtime., Disp: 15 mL, Rfl: 0   ketorolac  (ACULAR ) 0.5 % ophthalmic solution, Place 1 drop into the right eye 2 (two) times daily., Disp: , Rfl:    levothyroxine  (SYNTHROID ) 50 MCG tablet, TAKE ONE TABLET ONCE DAILY BEFORE BREAKFAST, Disp: 90 tablet, Rfl: 1   senna-docusate (SENOKOT-S) 8.6-50 MG  tablet, Take 2 tablets by mouth at bedtime., Disp: 60 tablet, Rfl: 1   sodium bicarbonate 650 MG tablet, Take 1 tablet (650 mg total) by mouth 2 (two) times daily., Disp: 60 tablet, Rfl: 0   traZODone  (DESYREL ) 50 MG tablet, Take 1 tablet (50 mg total) by mouth at bedtime as needed for sleep., Disp: 30 tablet, Rfl: 0   HYDROcodone -acetaminophen  (NORCO/VICODIN) 5-325 MG tablet, Take 1  tablet by mouth every 8 (eight) hours as needed. (Patient not taking: Reported on 03/23/2024), Disp: 15 tablet, Rfl: 0   losartan  (COZAAR ) 25 MG tablet, Take 1 tablet (25 mg total) by mouth daily. **dose reduction, Disp: 90 tablet, Rfl: 3 Social History   Socioeconomic History   Marital status: Married    Spouse name: [name]   Number of children: 0   Years of education: 12   Highest education level: Not on file  Occupational History   Occupation: Retired    Associate Professor: RETIRED  Tobacco Use   Smoking status: Never   Smokeless tobacco: Never  Vaping Use   Vaping status: Never Used  Substance and Sexual Activity   Alcohol use: No   Drug use: No   Sexual activity: Not Currently  Other Topics Concern   Not on file  Social History Narrative   Lives with husband.    Lost her mother in 1999   Caffeine use: none      01/19/22 No kids, support each other. Spouse diagnosed with cancer recently. She is legally blind but cares for spouse and vice versa. As of 2023 married for 55 years per pt   Social Drivers of Corporate Investment Banker Strain: Low Risk  (05/27/2023)   Overall Financial Resource Strain (CARDIA)    Difficulty of Paying Living Expenses: Not hard at all  Food Insecurity: Food Insecurity Present (02/22/2024)   Hunger Vital Sign    Worried About Running Out of Food in the Last Year: Never true    Ran Out of Food in the Last Year: Sometimes true  Transportation Needs: No Transportation Needs (02/22/2024)   PRAPARE - Administrator, Civil Service (Medical): No    Lack of Transportation  (Non-Medical): No  Physical Activity: Insufficiently Active (05/27/2023)   Exercise Vital Sign    Days of Exercise per Week: 3 days    Minutes of Exercise per Session: 30 min  Stress: No Stress Concern Present (05/27/2023)   [employer] of Occupational Health - Occupational Stress Questionnaire    Feeling of Stress : Not at all  Social Connections: Moderately Integrated (02/22/2024)   Social Connection and Isolation Panel    Frequency of Communication with Friends and Family: More than three times a week    Frequency of Social Gatherings with Friends and Family: Three times a week    Attends Religious Services: 1 to 4 times per year    Active Member of Clubs or Organizations: No    Attends Banker Meetings: Never    Marital Status: Married  Catering Manager Violence: Not At Risk (02/22/2024)   Humiliation, Afraid, Rape, and Kick questionnaire    Fear of Current or Ex-Partner: No    Emotionally Abused: No    Physically Abused: No    Sexually Abused: No   Family History  Problem Relation Age of Onset   Coronary artery disease Mother 20   Heart disease Mother    Diabetes Mother    Colon cancer Father 62   Prostate cancer Father    Diabetes Father    Coronary artery disease Sister 2   Diabetes Sister    Heart disease Sister    Diabetes Sister    Diabetes Sister    Diabetes Sister    Diabetes Sister    Diabetes Sister    Diabetes Maternal Grandmother    Esophageal cancer Neg Hx    Stomach cancer Neg Hx    Rectal cancer  Neg Hx     Objective: Office vital signs reviewed. BP (!) 105/52   Pulse (!) 55   Temp 98.1 F (36.7 C)   Ht 5' 1 (1.549 m)   Wt 138 lb (62.6 kg)   SpO2 97%   BMI 26.07 kg/m   Physical Examination:  General: Awake, alert, nontoxic elderly female, No acute distress HEENT: Sclera white.  Moist mucous membranes Cardio: regular rate and rhythm, S1S2 heard, no murmurs appreciated Pulm: clear to auscultation bilaterally, no wheezes,  rhonchi or rales; normal work of breathing on room air MSK: Arrives in wheelchair today.  Some difficulty moving her lower extremities and requires utilization of her upper extremities to lift lower extremities at times Extremities: No edema.  Warm, well-perfused  Diabetic Foot Exam - Simple   Simple Foot Form Diabetic Foot exam was performed with the following findings: Yes 03/23/2024  3:57 PM  Visual Inspection See comments: Yes Sensation Testing Intact to touch and monofilament testing bilaterally: Yes Pulse Check Posterior Tibialis and Dorsalis pulse intact bilaterally: Yes Comments Has some overlapping deformity of the toes bilaterally.  She has what appears to be heel ulcer on the right medial aspect of the back of the heel that is very shallow      Assessment/ Plan: 78 y.o. female   Type 2 diabetes mellitus with stage 3a chronic kidney disease, with long-term current use of insulin  (HCC) - Plan: For Home Use Only DME Diabetic Shoe  Hypertension associated with diabetes (HCC) - Plan: losartan  (COZAAR ) 25 MG tablet  Hyperlipidemia associated with type 2 diabetes mellitus (HCC)  Skin ulcer of right heel, limited to breakdown of skin (HCC) - Plan: For Home Use Only DME Diabetic Shoe  Impaired mobility and endurance - Plan: For home use only DME standard manual wheelchair with seat cushion, For home use only DME Cane  Compression fracture of L1 vertebra with routine healing, subsequent encounter - Plan: For home use only DME standard manual wheelchair with seat cushion, For home use only DME Cane  Polyarthralgia - Plan: For home use only DME standard manual wheelchair with seat cushion, For home use only DME Cane   A1c was at goal on lab drawl in October by hospital.  She also had renal function testing done recently and this was her baseline.  Her physical exam was notable for what appears to be a shallow heel ulcer on the right foot.  I have ordered diabetic shoes for her.   Advised for close observation of this lesion.  Her blood pressure was controlled and really a little on the low side so I reduced her losartan  to 25 mg daily and advised to continue monitoring blood pressures with goal of less than 140/90 given CKD  Rx for transport wheelchair as well as 1 pronged cane ordered and will see if we can get this through her home health agency.  Otherwise, Donny, to reroute to appropriate facility that is covered by insurance   Norene CHRISTELLA [name], DO [facility] [phone]

## 2024-03-26 ENCOUNTER — Other Ambulatory Visit: Payer: Self-pay | Admitting: *Deleted

## 2024-03-26 DIAGNOSIS — E1143 Type 2 diabetes mellitus with diabetic autonomic (poly)neuropathy: Secondary | ICD-10-CM | POA: Diagnosis not present

## 2024-03-26 DIAGNOSIS — F411 Generalized anxiety disorder: Secondary | ICD-10-CM | POA: Diagnosis not present

## 2024-03-26 DIAGNOSIS — I2510 Atherosclerotic heart disease of native coronary artery without angina pectoris: Secondary | ICD-10-CM | POA: Diagnosis not present

## 2024-03-26 DIAGNOSIS — N390 Urinary tract infection, site not specified: Secondary | ICD-10-CM | POA: Diagnosis not present

## 2024-03-26 DIAGNOSIS — E1122 Type 2 diabetes mellitus with diabetic chronic kidney disease: Secondary | ICD-10-CM | POA: Diagnosis not present

## 2024-03-26 DIAGNOSIS — E11319 Type 2 diabetes mellitus with unspecified diabetic retinopathy without macular edema: Secondary | ICD-10-CM | POA: Diagnosis not present

## 2024-03-26 DIAGNOSIS — D3502 Benign neoplasm of left adrenal gland: Secondary | ICD-10-CM | POA: Diagnosis not present

## 2024-03-26 DIAGNOSIS — E1140 Type 2 diabetes mellitus with diabetic neuropathy, unspecified: Secondary | ICD-10-CM | POA: Diagnosis not present

## 2024-03-26 DIAGNOSIS — E782 Mixed hyperlipidemia: Secondary | ICD-10-CM | POA: Diagnosis not present

## 2024-03-26 DIAGNOSIS — Z556 Problems related to health literacy: Secondary | ICD-10-CM | POA: Diagnosis not present

## 2024-03-26 DIAGNOSIS — E039 Hypothyroidism, unspecified: Secondary | ICD-10-CM | POA: Diagnosis not present

## 2024-03-26 DIAGNOSIS — D631 Anemia in chronic kidney disease: Secondary | ICD-10-CM | POA: Diagnosis not present

## 2024-03-26 DIAGNOSIS — K3184 Gastroparesis: Secondary | ICD-10-CM | POA: Diagnosis not present

## 2024-03-26 DIAGNOSIS — E875 Hyperkalemia: Secondary | ICD-10-CM | POA: Diagnosis not present

## 2024-03-26 DIAGNOSIS — S32009D Unspecified fracture of unspecified lumbar vertebra, subsequent encounter for fracture with routine healing: Secondary | ICD-10-CM | POA: Diagnosis not present

## 2024-03-26 DIAGNOSIS — H548 Legal blindness, as defined in USA: Secondary | ICD-10-CM | POA: Diagnosis not present

## 2024-03-26 DIAGNOSIS — I129 Hypertensive chronic kidney disease with stage 1 through stage 4 chronic kidney disease, or unspecified chronic kidney disease: Secondary | ICD-10-CM | POA: Diagnosis not present

## 2024-03-26 DIAGNOSIS — R296 Repeated falls: Secondary | ICD-10-CM | POA: Diagnosis not present

## 2024-03-26 DIAGNOSIS — M109 Gout, unspecified: Secondary | ICD-10-CM | POA: Diagnosis not present

## 2024-03-26 DIAGNOSIS — N1831 Chronic kidney disease, stage 3a: Secondary | ICD-10-CM | POA: Diagnosis not present

## 2024-03-26 DIAGNOSIS — M1990 Unspecified osteoarthritis, unspecified site: Secondary | ICD-10-CM | POA: Diagnosis not present

## 2024-03-26 DIAGNOSIS — Z9181 History of falling: Secondary | ICD-10-CM | POA: Diagnosis not present

## 2024-03-26 MED ORDER — TRAZODONE HCL 50 MG PO TABS: 50.0000 mg | ORAL_TABLET | Freq: Every evening | ORAL | 3 refills | Status: AC | PRN

## 2024-03-26 NOTE — Telephone Encounter (Signed)
 Sodium bicarb has to go to [facility] but I can send in trazodone .

## 2024-03-27 DIAGNOSIS — E1140 Type 2 diabetes mellitus with diabetic neuropathy, unspecified: Secondary | ICD-10-CM | POA: Diagnosis not present

## 2024-03-27 DIAGNOSIS — E11319 Type 2 diabetes mellitus with unspecified diabetic retinopathy without macular edema: Secondary | ICD-10-CM | POA: Diagnosis not present

## 2024-03-27 DIAGNOSIS — E1143 Type 2 diabetes mellitus with diabetic autonomic (poly)neuropathy: Secondary | ICD-10-CM | POA: Diagnosis not present

## 2024-03-27 DIAGNOSIS — E1122 Type 2 diabetes mellitus with diabetic chronic kidney disease: Secondary | ICD-10-CM | POA: Diagnosis not present

## 2024-03-27 DIAGNOSIS — E782 Mixed hyperlipidemia: Secondary | ICD-10-CM | POA: Diagnosis not present

## 2024-03-27 DIAGNOSIS — N1831 Chronic kidney disease, stage 3a: Secondary | ICD-10-CM | POA: Diagnosis not present

## 2024-03-27 DIAGNOSIS — N390 Urinary tract infection, site not specified: Secondary | ICD-10-CM | POA: Diagnosis not present

## 2024-03-27 DIAGNOSIS — H548 Legal blindness, as defined in USA: Secondary | ICD-10-CM | POA: Diagnosis not present

## 2024-03-27 DIAGNOSIS — I129 Hypertensive chronic kidney disease with stage 1 through stage 4 chronic kidney disease, or unspecified chronic kidney disease: Secondary | ICD-10-CM | POA: Diagnosis not present

## 2024-03-27 DIAGNOSIS — R296 Repeated falls: Secondary | ICD-10-CM | POA: Diagnosis not present

## 2024-03-27 DIAGNOSIS — E875 Hyperkalemia: Secondary | ICD-10-CM | POA: Diagnosis not present

## 2024-03-27 DIAGNOSIS — D3502 Benign neoplasm of left adrenal gland: Secondary | ICD-10-CM | POA: Diagnosis not present

## 2024-03-27 DIAGNOSIS — F411 Generalized anxiety disorder: Secondary | ICD-10-CM | POA: Diagnosis not present

## 2024-03-27 DIAGNOSIS — S32009D Unspecified fracture of unspecified lumbar vertebra, subsequent encounter for fracture with routine healing: Secondary | ICD-10-CM | POA: Diagnosis not present

## 2024-03-27 DIAGNOSIS — D631 Anemia in chronic kidney disease: Secondary | ICD-10-CM | POA: Diagnosis not present

## 2024-03-27 DIAGNOSIS — E039 Hypothyroidism, unspecified: Secondary | ICD-10-CM | POA: Diagnosis not present

## 2024-03-27 DIAGNOSIS — Z556 Problems related to health literacy: Secondary | ICD-10-CM | POA: Diagnosis not present

## 2024-03-27 DIAGNOSIS — Z9181 History of falling: Secondary | ICD-10-CM | POA: Diagnosis not present

## 2024-03-27 DIAGNOSIS — M1990 Unspecified osteoarthritis, unspecified site: Secondary | ICD-10-CM | POA: Diagnosis not present

## 2024-03-27 DIAGNOSIS — I2510 Atherosclerotic heart disease of native coronary artery without angina pectoris: Secondary | ICD-10-CM | POA: Diagnosis not present

## 2024-03-27 DIAGNOSIS — M109 Gout, unspecified: Secondary | ICD-10-CM | POA: Diagnosis not present

## 2024-03-27 DIAGNOSIS — K3184 Gastroparesis: Secondary | ICD-10-CM | POA: Diagnosis not present

## 2024-03-29 ENCOUNTER — Telehealth: Payer: Self-pay

## 2024-03-29 DIAGNOSIS — R159 Full incontinence of feces: Secondary | ICD-10-CM

## 2024-03-29 NOTE — Telephone Encounter (Unsigned)
 Copied from CRM [id number]. Topic: Clinical - Order For Equipment >> Mar 29, 2024  8:21 AM Farrel B wrote: Reason for CRM: [facility] [phone], called in regard to patient needing a tub bench and diapers. Patient is wondering if she would qualify for the diapers she is needing. Please call [name] to advise.   Patient uses [facility] [place] - [address]

## 2024-03-30 NOTE — Addendum Note (Signed)
 Addended by: JOLINDA NORENE HERO on: 03/30/2024 02:23 PM   Modules accepted: Orders

## 2024-03-30 NOTE — Telephone Encounter (Signed)
 Spoke to darlene from [employer]. She requests Medium size

## 2024-03-30 NOTE — Telephone Encounter (Signed)
 Ok to give verbal on home supply needs identified by [facility].

## 2024-03-30 NOTE — Telephone Encounter (Signed)
 Placed in your basket for sending.

## 2024-03-30 NOTE — Telephone Encounter (Signed)
 I need to know what size diapers she needs

## 2024-03-30 NOTE — Progress Notes (Signed)
 [name]                                          MRN: [id number]   03/30/2024   The VBCI Quality Team Specialist reviewed this patient medical record for the purposes of chart review for care gap closure. The following were reviewed: abstraction for care gap closure-kidney health evaluation for diabetes:eGFR  and uACR.    VBCI Quality Team

## 2024-03-30 NOTE — Telephone Encounter (Signed)
 Spoke to home health. They are asking us  to fax order to [facility]. [facility] offers home delivery.

## 2024-04-02 NOTE — Telephone Encounter (Signed)
 Orders were faxed to [facility] on 03/30/24

## 2024-04-03 ENCOUNTER — Telehealth: Payer: Self-pay | Admitting: "Endocrinology

## 2024-04-03 DIAGNOSIS — S32009D Unspecified fracture of unspecified lumbar vertebra, subsequent encounter for fracture with routine healing: Secondary | ICD-10-CM | POA: Diagnosis not present

## 2024-04-03 DIAGNOSIS — E875 Hyperkalemia: Secondary | ICD-10-CM | POA: Diagnosis not present

## 2024-04-03 DIAGNOSIS — R296 Repeated falls: Secondary | ICD-10-CM | POA: Diagnosis not present

## 2024-04-03 DIAGNOSIS — Z9181 History of falling: Secondary | ICD-10-CM | POA: Diagnosis not present

## 2024-04-03 DIAGNOSIS — H548 Legal blindness, as defined in USA: Secondary | ICD-10-CM | POA: Diagnosis not present

## 2024-04-03 DIAGNOSIS — E039 Hypothyroidism, unspecified: Secondary | ICD-10-CM | POA: Diagnosis not present

## 2024-04-03 DIAGNOSIS — Z556 Problems related to health literacy: Secondary | ICD-10-CM | POA: Diagnosis not present

## 2024-04-03 DIAGNOSIS — N390 Urinary tract infection, site not specified: Secondary | ICD-10-CM | POA: Diagnosis not present

## 2024-04-03 DIAGNOSIS — E11319 Type 2 diabetes mellitus with unspecified diabetic retinopathy without macular edema: Secondary | ICD-10-CM | POA: Diagnosis not present

## 2024-04-03 DIAGNOSIS — F411 Generalized anxiety disorder: Secondary | ICD-10-CM | POA: Diagnosis not present

## 2024-04-03 DIAGNOSIS — I129 Hypertensive chronic kidney disease with stage 1 through stage 4 chronic kidney disease, or unspecified chronic kidney disease: Secondary | ICD-10-CM | POA: Diagnosis not present

## 2024-04-03 DIAGNOSIS — M109 Gout, unspecified: Secondary | ICD-10-CM | POA: Diagnosis not present

## 2024-04-03 DIAGNOSIS — K3184 Gastroparesis: Secondary | ICD-10-CM | POA: Diagnosis not present

## 2024-04-03 DIAGNOSIS — E1140 Type 2 diabetes mellitus with diabetic neuropathy, unspecified: Secondary | ICD-10-CM | POA: Diagnosis not present

## 2024-04-03 DIAGNOSIS — D631 Anemia in chronic kidney disease: Secondary | ICD-10-CM | POA: Diagnosis not present

## 2024-04-03 DIAGNOSIS — E782 Mixed hyperlipidemia: Secondary | ICD-10-CM | POA: Diagnosis not present

## 2024-04-03 DIAGNOSIS — M1990 Unspecified osteoarthritis, unspecified site: Secondary | ICD-10-CM | POA: Diagnosis not present

## 2024-04-03 DIAGNOSIS — D3502 Benign neoplasm of left adrenal gland: Secondary | ICD-10-CM | POA: Diagnosis not present

## 2024-04-03 DIAGNOSIS — E1122 Type 2 diabetes mellitus with diabetic chronic kidney disease: Secondary | ICD-10-CM | POA: Diagnosis not present

## 2024-04-03 DIAGNOSIS — E1143 Type 2 diabetes mellitus with diabetic autonomic (poly)neuropathy: Secondary | ICD-10-CM | POA: Diagnosis not present

## 2024-04-03 DIAGNOSIS — N1831 Chronic kidney disease, stage 3a: Secondary | ICD-10-CM | POA: Diagnosis not present

## 2024-04-03 DIAGNOSIS — I2510 Atherosclerotic heart disease of native coronary artery without angina pectoris: Secondary | ICD-10-CM | POA: Diagnosis not present

## 2024-04-03 NOTE — Telephone Encounter (Signed)
 Pt had labs done in September, can we use those for her visit next week?

## 2024-04-04 ENCOUNTER — Telehealth: Payer: Self-pay

## 2024-04-04 DIAGNOSIS — R159 Full incontinence of feces: Secondary | ICD-10-CM

## 2024-04-04 DIAGNOSIS — S32010D Wedge compression fracture of first lumbar vertebra, subsequent encounter for fracture with routine healing: Secondary | ICD-10-CM

## 2024-04-04 DIAGNOSIS — Z7409 Other reduced mobility: Secondary | ICD-10-CM

## 2024-04-04 NOTE — Telephone Encounter (Signed)
 My apologies. I thought this was a duplicate. Rx completed

## 2024-04-04 NOTE — Addendum Note (Signed)
 Addended by: JOLINDA NORENE HERO on: 04/04/2024 04:34 PM   Modules accepted: Orders

## 2024-04-04 NOTE — Telephone Encounter (Signed)
 Again, please check the orders in her chart. These were placed 03/30/2024 and given to [facility] to send.

## 2024-04-04 NOTE — Telephone Encounter (Signed)
 Copied from CRM [id number]. Topic: Clinical - Order For Equipment >> Apr 04, 2024 12:02 PM Antwanette L wrote: Reason for CRM: Jenna, the patient's nurse from [facility], called to request that the provider place an order for a shower bench to assist the patient with getting in and out of the shower, as well as a transport chair to facilitate travel to and from appointments. Additionally, the patient is requesting a light that her husband can install in her car.Jenna can be contacted at [phone]

## 2024-04-04 NOTE — Telephone Encounter (Signed)
 Shower chair is the new request that I see in this phone call. Ones done on 03/30/24 were incontinence supplies & bedside commode, before that a wheelchair was done.

## 2024-04-05 DIAGNOSIS — D631 Anemia in chronic kidney disease: Secondary | ICD-10-CM | POA: Diagnosis not present

## 2024-04-05 DIAGNOSIS — I2510 Atherosclerotic heart disease of native coronary artery without angina pectoris: Secondary | ICD-10-CM | POA: Diagnosis not present

## 2024-04-05 DIAGNOSIS — M109 Gout, unspecified: Secondary | ICD-10-CM | POA: Diagnosis not present

## 2024-04-05 DIAGNOSIS — N1831 Chronic kidney disease, stage 3a: Secondary | ICD-10-CM | POA: Diagnosis not present

## 2024-04-05 DIAGNOSIS — Z556 Problems related to health literacy: Secondary | ICD-10-CM | POA: Diagnosis not present

## 2024-04-05 DIAGNOSIS — E11319 Type 2 diabetes mellitus with unspecified diabetic retinopathy without macular edema: Secondary | ICD-10-CM | POA: Diagnosis not present

## 2024-04-05 DIAGNOSIS — E1143 Type 2 diabetes mellitus with diabetic autonomic (poly)neuropathy: Secondary | ICD-10-CM | POA: Diagnosis not present

## 2024-04-05 DIAGNOSIS — M1990 Unspecified osteoarthritis, unspecified site: Secondary | ICD-10-CM | POA: Diagnosis not present

## 2024-04-05 DIAGNOSIS — H548 Legal blindness, as defined in USA: Secondary | ICD-10-CM | POA: Diagnosis not present

## 2024-04-05 DIAGNOSIS — E875 Hyperkalemia: Secondary | ICD-10-CM | POA: Diagnosis not present

## 2024-04-05 DIAGNOSIS — E1140 Type 2 diabetes mellitus with diabetic neuropathy, unspecified: Secondary | ICD-10-CM | POA: Diagnosis not present

## 2024-04-05 DIAGNOSIS — K3184 Gastroparesis: Secondary | ICD-10-CM | POA: Diagnosis not present

## 2024-04-05 DIAGNOSIS — D3502 Benign neoplasm of left adrenal gland: Secondary | ICD-10-CM | POA: Diagnosis not present

## 2024-04-05 DIAGNOSIS — E039 Hypothyroidism, unspecified: Secondary | ICD-10-CM | POA: Diagnosis not present

## 2024-04-05 DIAGNOSIS — I129 Hypertensive chronic kidney disease with stage 1 through stage 4 chronic kidney disease, or unspecified chronic kidney disease: Secondary | ICD-10-CM | POA: Diagnosis not present

## 2024-04-05 DIAGNOSIS — E1122 Type 2 diabetes mellitus with diabetic chronic kidney disease: Secondary | ICD-10-CM | POA: Diagnosis not present

## 2024-04-05 DIAGNOSIS — N390 Urinary tract infection, site not specified: Secondary | ICD-10-CM | POA: Diagnosis not present

## 2024-04-05 DIAGNOSIS — F411 Generalized anxiety disorder: Secondary | ICD-10-CM | POA: Diagnosis not present

## 2024-04-05 DIAGNOSIS — E782 Mixed hyperlipidemia: Secondary | ICD-10-CM | POA: Diagnosis not present

## 2024-04-05 DIAGNOSIS — S32009D Unspecified fracture of unspecified lumbar vertebra, subsequent encounter for fracture with routine healing: Secondary | ICD-10-CM | POA: Diagnosis not present

## 2024-04-05 DIAGNOSIS — R296 Repeated falls: Secondary | ICD-10-CM | POA: Diagnosis not present

## 2024-04-05 DIAGNOSIS — Z9181 History of falling: Secondary | ICD-10-CM | POA: Diagnosis not present

## 2024-04-05 NOTE — Telephone Encounter (Signed)
 Shower chair ordr faxed to [employer] [facility]

## 2024-04-06 ENCOUNTER — Telehealth: Payer: Self-pay | Admitting: *Deleted

## 2024-04-06 NOTE — Telephone Encounter (Signed)
 Returned patient call, informed patient that we sent the Transport chair script to [employer] but we will go ahead and send it over to [facility]. Patient verbalized understanding.

## 2024-04-06 NOTE — Telephone Encounter (Signed)
 Copied from CRM #[id number]. Topic: Clinical - Prescription Issue >> Apr 06, 2024 11:36 AM Rosaria BRAVO wrote: Reason for CRM: Pt called requesting to speak to the clinic regarding her transport chair that is supposed to have been sent to the apothecary. Please advise, she wants to speak to her PCP's nurse specifically.   Best contact: [phone]

## 2024-04-10 DIAGNOSIS — N1831 Chronic kidney disease, stage 3a: Secondary | ICD-10-CM | POA: Diagnosis not present

## 2024-04-10 DIAGNOSIS — E1140 Type 2 diabetes mellitus with diabetic neuropathy, unspecified: Secondary | ICD-10-CM | POA: Diagnosis not present

## 2024-04-10 DIAGNOSIS — R296 Repeated falls: Secondary | ICD-10-CM | POA: Diagnosis not present

## 2024-04-10 DIAGNOSIS — H548 Legal blindness, as defined in USA: Secondary | ICD-10-CM | POA: Diagnosis not present

## 2024-04-10 DIAGNOSIS — F411 Generalized anxiety disorder: Secondary | ICD-10-CM | POA: Diagnosis not present

## 2024-04-10 DIAGNOSIS — Z556 Problems related to health literacy: Secondary | ICD-10-CM | POA: Diagnosis not present

## 2024-04-10 DIAGNOSIS — E039 Hypothyroidism, unspecified: Secondary | ICD-10-CM | POA: Diagnosis not present

## 2024-04-10 DIAGNOSIS — D631 Anemia in chronic kidney disease: Secondary | ICD-10-CM | POA: Diagnosis not present

## 2024-04-10 DIAGNOSIS — E11319 Type 2 diabetes mellitus with unspecified diabetic retinopathy without macular edema: Secondary | ICD-10-CM | POA: Diagnosis not present

## 2024-04-10 DIAGNOSIS — M1990 Unspecified osteoarthritis, unspecified site: Secondary | ICD-10-CM | POA: Diagnosis not present

## 2024-04-10 DIAGNOSIS — I129 Hypertensive chronic kidney disease with stage 1 through stage 4 chronic kidney disease, or unspecified chronic kidney disease: Secondary | ICD-10-CM | POA: Diagnosis not present

## 2024-04-10 DIAGNOSIS — E1143 Type 2 diabetes mellitus with diabetic autonomic (poly)neuropathy: Secondary | ICD-10-CM | POA: Diagnosis not present

## 2024-04-10 DIAGNOSIS — E1122 Type 2 diabetes mellitus with diabetic chronic kidney disease: Secondary | ICD-10-CM | POA: Diagnosis not present

## 2024-04-10 DIAGNOSIS — E875 Hyperkalemia: Secondary | ICD-10-CM | POA: Diagnosis not present

## 2024-04-10 DIAGNOSIS — I2510 Atherosclerotic heart disease of native coronary artery without angina pectoris: Secondary | ICD-10-CM | POA: Diagnosis not present

## 2024-04-10 DIAGNOSIS — N390 Urinary tract infection, site not specified: Secondary | ICD-10-CM | POA: Diagnosis not present

## 2024-04-10 DIAGNOSIS — Z9181 History of falling: Secondary | ICD-10-CM | POA: Diagnosis not present

## 2024-04-10 DIAGNOSIS — S32009D Unspecified fracture of unspecified lumbar vertebra, subsequent encounter for fracture with routine healing: Secondary | ICD-10-CM | POA: Diagnosis not present

## 2024-04-10 DIAGNOSIS — K3184 Gastroparesis: Secondary | ICD-10-CM | POA: Diagnosis not present

## 2024-04-10 DIAGNOSIS — M109 Gout, unspecified: Secondary | ICD-10-CM | POA: Diagnosis not present

## 2024-04-10 DIAGNOSIS — E782 Mixed hyperlipidemia: Secondary | ICD-10-CM | POA: Diagnosis not present

## 2024-04-10 DIAGNOSIS — D3502 Benign neoplasm of left adrenal gland: Secondary | ICD-10-CM | POA: Diagnosis not present

## 2024-04-10 NOTE — Telephone Encounter (Signed)
 LMOVM that ordr for transport wheelchair was faxed to [place] on Friday, received fax from them yesterday that pt's [employer] MCR was OON. I suggested that she call her ins to find out what DME co was in network and to give me a call back, I left her my direct #

## 2024-04-11 ENCOUNTER — Encounter: Payer: Self-pay | Admitting: "Endocrinology

## 2024-04-11 ENCOUNTER — Ambulatory Visit: Admitting: "Endocrinology

## 2024-04-11 VITALS — BP 100/40 | HR 60

## 2024-04-11 DIAGNOSIS — E034 Atrophy of thyroid (acquired): Secondary | ICD-10-CM | POA: Diagnosis not present

## 2024-04-11 DIAGNOSIS — Z794 Long term (current) use of insulin: Secondary | ICD-10-CM | POA: Diagnosis not present

## 2024-04-11 DIAGNOSIS — I10 Essential (primary) hypertension: Secondary | ICD-10-CM

## 2024-04-11 DIAGNOSIS — N1832 Chronic kidney disease, stage 3b: Secondary | ICD-10-CM

## 2024-04-11 DIAGNOSIS — M8000XA Age-related osteoporosis with current pathological fracture, unspecified site, initial encounter for fracture: Secondary | ICD-10-CM

## 2024-04-11 DIAGNOSIS — E782 Mixed hyperlipidemia: Secondary | ICD-10-CM

## 2024-04-11 DIAGNOSIS — E1122 Type 2 diabetes mellitus with diabetic chronic kidney disease: Secondary | ICD-10-CM | POA: Diagnosis not present

## 2024-04-11 NOTE — Progress Notes (Signed)
 04/11/2024   Endocrinology follow-up note   Subjective:    Patient ID: [name], female    DOB: 14-Oct-1945.  She is being seen in follow-up in the management of uncontrolled type 2 diabetes, hypothyroidism, hypertension, hyperlipidemia.  Past Medical History:  Diagnosis Date   Anal fistula    Anemia in chronic renal disease    Aranesp  injection --  when Hg <11, last injection 12-24-14.   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    CKD (chronic kidney disease), stage III [facility])    nephrologist--  dr froylan-- LOV  07-09-2016   Complication of anesthesia    post-op confusion    Constipation    Coronary artery disease    Diabetic gastroparesis (HCC)    Diabetic retinopathy (HCC)    bilateral --  monitored by dr elner   Diverticulosis of colon    GAD (generalized anxiety disorder)    GERD (gastroesophageal reflux disease)    Hip fracture requiring operative repair [facility])    History of colon polyps    benign   History of esophagitis    History of GI bleed    upper 2009  due to esophagitis  &  2002  due to Mallory-Weiss tear   History of hyperkalemia    pt had previously been canceled twice dos due to elevated K+, 07-29-2016 last date cancelled -- pt visited pcp same day treated w/ kayexelate and K+ came down, pt brought all her medication's in to pcp office and found pt was taking losartan  that had been discontinued due to ckd, pcp stated this was cause of elevated K+   History of Mallory-Weiss syndrome    12/ 2002--  resolved   History of rectal abscess    12-29-2004  bedside I & D   Hyperlipidemia    Hypertension    Hypothyroidism    LAFB (left  anterior fascicular block)    Right bundle branch block    Sacral decubitus ulcer    since 2014- 01-02-15 remains with wound gauze dressing changes daily.   Type II diabetes mellitus (HCC)    Past Surgical History:  Procedure Laterality Date   APPLICATION OF A-CELL OF EXTREMITY N/A 04/07/2015   Procedure: A CELL PLACMENT;  Surgeon: Estefana RAMAN Dillingham, DO;  Location: [facility] OR;  Service: Plastics;  Laterality: N/A;   CARDIOVASCULAR STRESS TEST  12/30/2004   normal perfusion study/  no ischemia or infartion/  normal LV wall function and wall motion , ef 66%   CATARACT EXTRACTION W/ INTRAOCULAR LENS  IMPLANT, BILATERAL  1995   COLONOSCOPY  2008   w/Dr.Brodie    COLONOSCOPY W/ POLYPECTOMY  last one 2008   COMPRESSION HIP SCREW Right 05/18/2014   Procedure: COMPRESSION HIP;  Surgeon: Taft FORBES Minerva, MD;  Location: [facility]S;  Service: Orthopedics;  Laterality: Right;   ESOPHAGOGASTRODUODENOSCOPY  last one 01-09-2011   EVALUATION UNDER ANESTHESIA WITH FISTULECTOMY N/A 01/06/2015   Procedure: EXAM UNDER ANESTHESIA , placement of seton;  Surgeon: Krystal Russell, MD;  Location: [facility];  Service: General;  Laterality: N/A;   I & D EXTREMITY N/A 04/07/2015   Procedure: IRRIGATION AND  DEBRIDEMENT ISCHIAL ULCER;  Surgeon: Estefana RAMAN Dillingham, DO;  Location: [facility] OR;  Service: Plastics;  Laterality: N/A;   INCISION AND DRAINAGE OF WOUND N/A 09/30/2014   Procedure: IRRIGATION AND DEBRIDEMENT SACRAL WOUND, EXCISION OF PERIRECTAL TRACT WITH PLACEMENT OF ACCELL;  Surgeon: Estefana Reichert, DO;  Location: [facility];  Service: Plastics;  Laterality: N/A;   LIGATION OF INTERNAL FISTULA TRACT N/A 10/22/2016   Procedure: LIGATION OF INTERNAL FISTULA TRACT;  Surgeon: Debby Hila, MD;  Location: [facility];  Service: General;  Laterality: N/A;   ORIF FEMUR FRACTURE Left 10/09/2012   Procedure: OPEN REDUCTION INTERNAL FIXATION (ORIF) DISTAL FEMUR FRACTURE;  Surgeon: Ozell VEAR Bruch,  MD;  Location: [facility];  Service: Orthopedics;  Laterality: Left;   RETINOPATHY SURGERY Bilateral 1980's?   SKIN CANCER EXCISION  03/2023   TRANSTHORACIC ECHOCARDIOGRAM  02/18/2011   mild LVH,  ef 55-60%,  grade I diastolic dysfunction/  mild TR/  RV systolic pressure increased consistant with moderate pulmonary hypertension   Social History   Socioeconomic History   Marital status: Married    Spouse name: [name]   Number of children: 0   Years of education: 12   Highest education level: Not on file  Occupational History   Occupation: Retired    Associate Professor: RETIRED  Tobacco Use   Smoking status: Never   Smokeless tobacco: Never  Vaping Use   Vaping status: Never Used  Substance and Sexual Activity   Alcohol use: No   Drug use: No   Sexual activity: Not Currently  Other Topics Concern   Not on file  Social History Narrative   Lives with husband.    Lost her mother in 1999   Caffeine use: none      01/19/22 No kids, support each other. Spouse diagnosed with cancer recently. She is legally blind but cares for spouse and vice versa. As of 2023 married for 55 years per pt   Social Drivers of Corporate Investment Banker Strain: Low Risk  (05/27/2023)   Overall Financial Resource Strain (CARDIA)    Difficulty of Paying Living Expenses: Not hard at all  Food Insecurity: Food Insecurity Present (02/22/2024)   Hunger Vital Sign    Worried About Running Out of Food in the Last Year: Never true    Ran Out of Food in the Last Year: Sometimes true  Transportation Needs: No Transportation Needs (02/22/2024)   PRAPARE - Administrator, Civil Service (Medical): No    Lack of Transportation (Non-Medical): No  Physical Activity: Insufficiently Active (05/27/2023)   Exercise Vital Sign    Days of Exercise per Week: 3 days    Minutes of Exercise per Session: 30 min  Stress: No Stress Concern Present (05/27/2023)   [employer] of Occupational Health - Occupational Stress Questionnaire     Feeling of Stress : Not at all  Social Connections: Moderately Integrated (02/22/2024)   Social Connection and Isolation Panel    Frequency of Communication with Friends and Family: More than three times a week    Frequency of Social Gatherings with Friends and Family: Three times a week    Attends Religious Services: 1 to 4 times per year    Active Member of Clubs or Organizations: No    Attends Banker Meetings: Never    Marital Status: Married   Outpatient Encounter Medications as of 04/11/2024  Medication Sig   Insulin  Glargine (BASAGLAR  KWIKPEN) 100 UNIT/ML Inject 6 Units  into the skin at bedtime. (Patient taking differently: Inject 8 Units into the skin at bedtime.)   acetaminophen  (TYLENOL ) 325 MG tablet Take 2 tablets (650 mg total) by mouth every 6 (six) hours as needed for mild pain (pain score 1-3) or fever (or Fever >/= 101).   amLODipine  (NORVASC ) 5 MG tablet Take 1 tablet (5 mg total) by mouth daily.   atorvastatin  (LIPITOR) 10 MG tablet Take 1 tablet (10 mg total) by mouth daily.   carvedilol  (COREG ) 6.25 MG tablet Take 1 tablet (6.25 mg total) by mouth 2 (two) times daily with a meal.   HYDROcodone -acetaminophen  (NORCO/VICODIN) 5-325 MG tablet Take 1 tablet by mouth every 8 (eight) hours as needed. (Patient not taking: Reported on 03/23/2024)   insulin  aspart (NOVOLOG ) 100 UNIT/ML FlexPen Inject 0-10 Units into the skin 3 (three) times daily with meals. Short acting insulin  per sliding scale 0-10 ---:-- Insulin  injection 0-10 Units 0-10 Units Subcutaneous, 3 times daily with meals CBG 70 - 120: 0 unit CBG 121 - 150: 0 unit  CBG 151 - 200: 1 unit CBG 201 - 250: 2 units CBG 251 - 300: 4 units CBG 301 - 350: 6 units  CBG 351 - 400: 8 units  CBG > 400: 10 units (Patient not taking: Reported on 04/11/2024)   ketorolac  (ACULAR ) 0.5 % ophthalmic solution Place 1 drop into the right eye 2 (two) times daily.   levothyroxine  (SYNTHROID ) 50 MCG tablet TAKE ONE TABLET ONCE  DAILY BEFORE BREAKFAST   losartan  (COZAAR ) 25 MG tablet Take 1 tablet (25 mg total) by mouth daily. **dose reduction   senna-docusate (SENOKOT-S) 8.6-50 MG tablet Take 2 tablets by mouth at bedtime.   sodium bicarbonate 650 MG tablet Take 1 tablet (650 mg total) by mouth 2 (two) times daily.   traZODone  (DESYREL ) 50 MG tablet Take 1 tablet (50 mg total) by mouth at bedtime as needed for sleep.   No facility-administered encounter medications on file as of 04/11/2024.   ALLERGIES: Allergies  Allergen Reactions   Aspirin Nausea And Vomiting   Ciprofloxacin Other (See Comments)    Upset Stomach   Codeine Nausea And Vomiting    Makes me sick    Hydrocodone -Acetaminophen      Other reaction(s): Unknown  09/14/22: Patient states that when she takes any type of pain medication she starts vomiting blood. States she was told not to take any OTC pain medication.   Losartan      Hyperkalemia    Micardis [Telmisartan] Other (See Comments)    unknown   Nexium [Esomeprazole Magnesium ] Other (See Comments)    Causes internal bleeding   Niaspan [Niacin Er (Antihyperlipidemic)] Other (See Comments)    Reaction is unknown   Nsaids     Other reaction(s): Unknown   Onglyza [Saxagliptin ] Other (See Comments)    Reaction is unknown   Other     No otc pain medications   Oxycodone  Hcl     Other reaction(s): Unknown   Rofecoxib Other (See Comments)    unknown   Simvastatin    Tequin [Gatifloxacin] Other (See Comments)    Reaction is unknown   Welchol [Colesevelam Hcl]    Amoxicillin Nausea And Vomiting and Rash    Has patient had a PCN reaction causing immediate rash, facial/tongue/throat swelling, SOB or lightheadedness with hypotension: Yes Has patient had a PCN reaction causing severe rash involving mucus membranes or skin necrosis: no  Has patient had a PCN reaction that required hospitalization No Has patient had  a PCN reaction occurring within the last 10 years: No If all of the above  answers are NO, then may proceed with Cephalosporin use.    VACCINATION STATUS: Immunization History  Administered Date(s) Administered   Fluad Quad(high Dose 65+) 02/05/2019, 03/05/2020, 03/10/2021, 03/15/2022   Fluad Trivalent(High Dose 65+) 03/03/2023   INFLUENZA, HIGH DOSE SEASONAL PF 03/05/2016, 03/14/2017, 03/01/2018   Influenza,inj,Quad PF,6+ Mos 02/16/2013, 02/27/2015   Influenza-Unspecified 02/13/2024   Moderna Covid-19 Vaccine  Bivalent Booster 48yrs & up 03/10/2021   Moderna Sars-Covid-2 Vaccination 10/14/2020   PFIZER(Purple Top)SARS-COV-2 Vaccination 06/13/2019, 07/04/2019, 03/05/2020   Pfizer(Comirnaty)Fall Seasonal Vaccine 12 years and older 03/15/2022   Pneumococcal Conjugate-13 10/11/2014   Pneumococcal Polysaccharide-23 10/10/2012   Respiratory Syncytial Virus Vaccine,Recomb Aduvanted(Arexvy) 04/21/2022   Tdap 10/06/2012, 03/03/2023   Zoster Recombinant(Shingrix) 06/02/2021, 08/04/2021    Diabetes She presents for her follow-up diabetic visit. She has type 2 diabetes mellitus. Onset time: She was diagnosed at approximate age of 75 years . Her disease course has been stable. There are no hypoglycemic associated symptoms. Pertinent negatives for hypoglycemia include no confusion, headaches, pallor or seizures. Pertinent negatives for diabetes include no chest pain, no fatigue, no polydipsia, no polyphagia and no visual change. There are no hypoglycemic complications. Symptoms are stable. Diabetic complications include nephropathy, peripheral neuropathy, PVD and retinopathy. Risk factors for coronary artery disease include diabetes mellitus, dyslipidemia, hypertension, sedentary lifestyle and post-menopausal. Current diabetic treatment includes oral agent (monotherapy). She is compliant with treatment most of the time. Her weight is fluctuating minimally. She is following a generally unhealthy diet. When asked about meal planning, she reported none. She has not had a previous  visit with a dietitian. She never participates in exercise. Her home blood glucose trend is fluctuating minimally. Her breakfast blood glucose range is generally 110-130 mg/dl. Her lunch blood glucose range is generally 130-140 mg/dl. Her bedtime blood glucose range is generally 130-140 mg/dl. Her overall blood glucose range is 130-140 mg/dl. (She is utilizing her freestyle Libre device.  She presents with her CGM device showing 25% time in range, 23% level 1 hyperglycemia, 2% level 2 hyperglycemia.  She is only on Basaglar  8 units every morning.  Her point-of-care A1c is 6.9%.  Her average blood glucose 150 mg per DL, no hypoglycemia documented or reported.   ) An ACE inhibitor/angiotensin II receptor blocker is being taken. Eye exam is current.  Thyroid  Problem Presents for follow-up visit. Patient reports no cold intolerance, diarrhea, fatigue, heat intolerance, palpitations or visual change. The symptoms have been stable.  Hypertension This is a chronic problem. The current episode started more than 1 year ago. Pertinent negatives include no chest pain, headaches, palpitations or shortness of breath. Past treatments include angiotensin blockers. Hypertensive end-organ damage includes PVD and retinopathy. Identifiable causes of hypertension include a thyroid  problem.    Review of systems  Constitutional: + Minimally fluctuating body weight,  current  There is no height or weight on file to calculate BMI. , no fatigue, no subjective hyperthermia, no subjective hypothermia    Objective:    BP (!) 100/40   Pulse 60   Wt Readings from Last 3 Encounters:  03/23/24 138 lb (62.6 kg)  02/22/24 147 lb (66.7 kg)  02/14/24 145 lb 15.1 oz (66.2 kg)     Physical Exam- Limited  Constitutional:  There is no height or weight on file to calculate BMI. , not in acute distress, normal state of mind    Results for orders placed or performed during the  hospital encounter of 02/22/24  Comprehensive  metabolic panel   Collection Time: 02/22/24  5:28 AM  Result Value Ref Range   Sodium 135 135 - 145 mmol/L   Potassium 5.0 3.5 - 5.1 mmol/L   Chloride 101 98 - 111 mmol/L   CO2 21 (L) 22 - 32 mmol/L   Glucose, Bld 120 (H) 70 - 99 mg/dL   BUN 30 (H) 8 - 23 mg/dL   Creatinine, Ser 8.02 (H) 0.44 - 1.00 mg/dL   Calcium  9.3 8.9 - 10.3 mg/dL   Total Protein 7.1 6.5 - 8.1 g/dL   Albumin 4.1 3.5 - 5.0 g/dL   AST 15 15 - 41 U/L   ALT <5 0 - 44 U/L   Alkaline Phosphatase 153 (H) 38 - 126 U/L   Total Bilirubin 0.5 0.0 - 1.2 mg/dL   GFR, Estimated 25 (L) >60 mL/min   Anion gap 13 5 - 15  CBC with Differential   Collection Time: 02/22/24  5:28 AM  Result Value Ref Range   WBC 9.0 4.0 - 10.5 K/uL   RBC 3.75 (L) 3.87 - 5.11 MIL/uL   Hemoglobin 11.5 (L) 12.0 - 15.0 g/dL   HCT 65.7 (L) 63.9 - 53.9 %   MCV 91.2 80.0 - 100.0 fL   MCH 30.7 26.0 - 34.0 pg   MCHC 33.6 30.0 - 36.0 g/dL   RDW 88.0 88.4 - 84.4 %   Platelets 183 150 - 400 K/uL   nRBC 0.0 0.0 - 0.2 %   Neutrophils Relative % 69 %   Neutro Abs 6.3 1.7 - 7.7 K/uL   Lymphocytes Relative 20 %   Lymphs Abs 1.8 0.7 - 4.0 K/uL   Monocytes Relative 6 %   Monocytes Absolute 0.6 0.1 - 1.0 K/uL   Eosinophils Relative 4 %   Eosinophils Absolute 0.3 0.0 - 0.5 K/uL   Basophils Relative 1 %   Basophils Absolute 0.1 0.0 - 0.1 K/uL   Immature Granulocytes 0 %   Abs Immature Granulocytes 0.04 0.00 - 0.07 K/uL  Urine Culture   Collection Time: 02/22/24  6:31 AM   Specimen: Urine, Clean Catch  Result Value Ref Range   Specimen Description      URINE, CLEAN CATCH Performed at [facility], [address], [place], [place] [address]    Special Requests      NONE Performed at [facility], [address] [place], [place] [address]    Culture (A)     90,000 COLONIES/mL ESCHERICHIA COLI Two isolates with different morphologies were identified as the same organism.The most resistant organism was reported. Performed at [facility],  [address] [place], [place] [address]    Report Status 02/25/2024 FINAL    Organism ID, Bacteria ESCHERICHIA COLI (A)       Susceptibility   Escherichia coli - MIC*    AMPICILLIN RESISTANT Resistant     CEFAZOLIN  (URINE) Value in next row Sensitive      16 SENSITIVEThis is a modified FDA-approved test that has been validated and its performance characteristics determined by the reporting laboratory.  This laboratory is certified under the Clinical Laboratory Improvement Amendments CLIA as qualified to perform high complexity clinical laboratory testing.    CEFEPIME Value in next row Sensitive      16 SENSITIVEThis is a modified FDA-approved test that has been validated and its performance characteristics determined by the reporting laboratory.  This laboratory is certified under the Clinical Laboratory Improvement Amendments CLIA  as qualified to perform high complexity clinical laboratory testing.    ERTAPENEM Value in next row Sensitive      16 SENSITIVEThis is a modified FDA-approved test that has been validated and its performance characteristics determined by the reporting laboratory.  This laboratory is certified under the Clinical Laboratory Improvement Amendments CLIA as qualified to perform high complexity clinical laboratory testing.    CEFTRIAXONE  Value in next row Sensitive      16 SENSITIVEThis is a modified FDA-approved test that has been validated and its performance characteristics determined by the reporting laboratory.  This laboratory is certified under the Clinical Laboratory Improvement Amendments CLIA as qualified to perform high complexity clinical laboratory testing.    CIPROFLOXACIN Value in next row Sensitive      16 SENSITIVEThis is a modified FDA-approved test that has been validated and its performance characteristics determined by the reporting laboratory.  This laboratory is certified under the Clinical Laboratory Improvement Amendments CLIA as qualified to perform high  complexity clinical laboratory testing.    GENTAMICIN Value in next row Sensitive      16 SENSITIVEThis is a modified FDA-approved test that has been validated and its performance characteristics determined by the reporting laboratory.  This laboratory is certified under the Clinical Laboratory Improvement Amendments CLIA as qualified to perform high complexity clinical laboratory testing.    NITROFURANTOIN Value in next row Sensitive      16 SENSITIVEThis is a modified FDA-approved test that has been validated and its performance characteristics determined by the reporting laboratory.  This laboratory is certified under the Clinical Laboratory Improvement Amendments CLIA as qualified to perform high complexity clinical laboratory testing.    TRIMETH/SULFA Value in next row Resistant      16 SENSITIVEThis is a modified FDA-approved test that has been validated and its performance characteristics determined by the reporting laboratory.  This laboratory is certified under the Clinical Laboratory Improvement Amendments CLIA as qualified to perform high complexity clinical laboratory testing.    AMPICILLIN/SULBACTAM Value in next row Sensitive      16 SENSITIVEThis is a modified FDA-approved test that has been validated and its performance characteristics determined by the reporting laboratory.  This laboratory is certified under the Clinical Laboratory Improvement Amendments CLIA as qualified to perform high complexity clinical laboratory testing.    PIP/TAZO Value in next row Sensitive      <=4 SENSITIVEThis is a modified FDA-approved test that has been validated and its performance characteristics determined by the reporting laboratory.  This laboratory is certified under the Clinical Laboratory Improvement Amendments CLIA as qualified to perform high complexity clinical laboratory testing.    MEROPENEM Value in next row Sensitive      <=4 SENSITIVEThis is a modified FDA-approved test that has been  validated and its performance characteristics determined by the reporting laboratory.  This laboratory is certified under the Clinical Laboratory Improvement Amendments CLIA as qualified to perform high complexity clinical laboratory testing.    * 90,000 COLONIES/mL ESCHERICHIA COLI  Urinalysis, Routine w reflex microscopic -Urine, Clean Catch   Collection Time: 02/22/24  6:31 AM  Result Value Ref Range   Color, Urine YELLOW YELLOW   APPearance HAZY (A) CLEAR   Specific Gravity, Urine 1.009 1.005 - 1.030   pH 6.0 5.0 - 8.0   Glucose, UA NEGATIVE NEGATIVE mg/dL   Hgb urine dipstick SMALL (A) NEGATIVE   Bilirubin Urine NEGATIVE NEGATIVE   Ketones, ur NEGATIVE NEGATIVE mg/dL   Protein, ur 899 (A) NEGATIVE mg/dL  Nitrite NEGATIVE NEGATIVE   Leukocytes,Ua LARGE (A) NEGATIVE   RBC / HPF 6-10 0 - 5 RBC/hpf   WBC, UA 21-50 0 - 5 WBC/hpf   Bacteria, UA MANY (A) NONE SEEN   Squamous Epithelial / HPF 0-5 0 - 5 /HPF   WBC Clumps PRESENT    Mucus PRESENT    Budding Yeast PRESENT    Hyaline Casts, UA PRESENT   CBG monitoring, ED   Collection Time: 02/22/24 10:46 AM  Result Value Ref Range   Glucose-Capillary 116 (H) 70 - 99 mg/dL  Hemoglobin J8r   Collection Time: 02/22/24 10:55 AM  Result Value Ref Range   Hgb A1c MFr Bld 6.9 (H) 4.8 - 5.6 %   Mean Plasma Glucose 151.33 mg/dL  CBG monitoring, ED   Collection Time: 02/22/24 12:33 PM  Result Value Ref Range   Glucose-Capillary 115 (H) 70 - 99 mg/dL  CBG monitoring, ED   Collection Time: 02/22/24  4:47 PM  Result Value Ref Range   Glucose-Capillary 80 70 - 99 mg/dL  Glucose, capillary   Collection Time: 02/22/24  8:14 PM  Result Value Ref Range   Glucose-Capillary 74 70 - 99 mg/dL  Glucose, capillary   Collection Time: 02/23/24  2:11 AM  Result Value Ref Range   Glucose-Capillary 131 (H) 70 - 99 mg/dL  Glucose, capillary   Collection Time: 02/23/24  7:21 AM  Result Value Ref Range   Glucose-Capillary 107 (H) 70 - 99 mg/dL    Comment 1 Notify RN    Comment 2 Document in Chart   Glucose, capillary   Collection Time: 02/23/24 11:53 AM  Result Value Ref Range   Glucose-Capillary 134 (H) 70 - 99 mg/dL   Comment 1 Notify RN    Comment 2 Document in Chart   Glucose, capillary   Collection Time: 02/23/24  4:27 PM  Result Value Ref Range   Glucose-Capillary 179 (H) 70 - 99 mg/dL   Comment 1 Notify RN    Comment 2 Document in Chart   Glucose, capillary   Collection Time: 02/23/24  8:36 PM  Result Value Ref Range   Glucose-Capillary 137 (H) 70 - 99 mg/dL  Glucose, capillary   Collection Time: 02/23/24  9:15 PM  Result Value Ref Range   Glucose-Capillary 163 (H) 70 - 99 mg/dL  Renal function panel   Collection Time: 02/24/24  4:08 AM  Result Value Ref Range   Sodium 136 135 - 145 mmol/L   Potassium 5.2 (H) 3.5 - 5.1 mmol/L   Chloride 105 98 - 111 mmol/L   CO2 19 (L) 22 - 32 mmol/L   Glucose, Bld 97 70 - 99 mg/dL   BUN 33 (H) 8 - 23 mg/dL   Creatinine, Ser 7.98 (H) 0.44 - 1.00 mg/dL   Calcium  8.4 (L) 8.9 - 10.3 mg/dL   Phosphorus 4.2 2.5 - 4.6 mg/dL   Albumin 3.3 (L) 3.5 - 5.0 g/dL   GFR, Estimated 25 (L) >60 mL/min   Anion gap 12 5 - 15  Glucose, capillary   Collection Time: 02/24/24  7:31 AM  Result Value Ref Range   Glucose-Capillary 106 (H) 70 - 99 mg/dL   Comment 1 Notify RN    Comment 2 Document in Chart    Diabetic Labs (most recent): Lab Results  Component Value Date   HGBA1C 6.9 (H) 02/22/2024   HGBA1C 6.9 (H) 07/18/2023   HGBA1C 6.5 (H) 04/20/2023   MICROALBUR 100 02/17/2013   Lipid Panel  Component Value Date/Time   CHOL 117 02/13/2024 0834   CHOL 105 09/11/2012 1057   TRIG 114 02/13/2024 0834   TRIG 170 (H) 10/11/2014 1159   TRIG 79 09/11/2012 1057   HDL 41 02/13/2024 0834   HDL 53 10/11/2014 1159   HDL 41 09/11/2012 1057   CHOLHDL 2.9 02/13/2024 0834   LDLCALC 55 02/13/2024 0834   LDLCALC 50 02/14/2014 1012   LDLCALC 48 09/11/2012 1057     Assessment & Plan:   1.  Type 2 diabetes mellitus with stage 4 chronic kidney disease, without long-term current use of insulin  [facility])  - Patient has currently controlled asymptomatic type 2 DM since  78 years of age.  She is utilizing her freestyle [place] device.  She presents with her CGM device showing 25% time in range, 23% level 1 hyperglycemia, 2% level 2 hyperglycemia.  She is only on Basaglar  8 units every morning.  Her point-of-care A1c is 6.9%.  Her average blood glucose 150 mg per DL, no hypoglycemia documented or reported.   -her  diabetes is complicated by PAD, CKD, neuropathy and patient remains at a high risk for more acute and chronic complications of diabetes which include CAD, CVA, CKD, retinopathy, and neuropathy. These are all discussed in detail with the patient.  - I have counseled the patient on diet management  by adopting a carbohydrate restricted/protein rich diet.   - Suggestion is made for her to avoid simple carbohydrates  from her diet including Cakes, Sweet Desserts, Ice Cream, Soda (diet and regular), Sweet Tea, Candies, Chips, Cookies, Store Bought Juices, Alcohol in Excess of  1-2 drinks a day, Artificial Sweeteners,  Coffee Creamer, and Sugar-free Products, Lemonade. This will help patient to have more stable blood glucose profile and potentially avoid unintended weight gain.  - I encouraged the patient to switch to  unprocessed or minimally processed complex starch and increased protein intake (animal or plant source), fruits, and vegetables.  - Patient is advised to stick to a routine mealtimes to eat 3 meals  a day and avoid unnecessary snacks ( to snack only to correct hypoglycemia).   - I have approached patient with the following individualized plan to manage diabetes and patient agrees:    -Her husband, [name] , continues to offer to help. -She is advised to Basaglar  8 units daily at breakfast,   advised to use her CGM to monitor blood glucose at all times.   - First priority in  this patient will be to avoid hypoglycemia.   -Patient is encouraged to call clinic for blood glucose levels less than 70 or above 200 mg /dl weekly average.  -Patient is not a candidate for MTF, Incretin therapy.  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    - Patient specific target  A1c;  LDL, HDL, Triglycerides,  were discussed in detail.  2) BP/HTN:  -Her blood pressure is controlled to target.  She is advised to continue her current blood pressure medications including amlodipine  5 mg p.o. daily, carvedilol  12.5 mg p.o. twice daily.  She  has documented allergy to ARB.  3) Lipids/HPL: Her recent lipid panel continues to show improvement in her LDL at 55.  she will continue to benefit from statin intervention currently on atorvastatin  10 mg p.o. daily at bedtime.  She is advised to continue.  Side effects and precautions discussed with her.     4)  Weight/Diet: Her BMI is 27.21-- She is not a candidate for major weight  loss.     CDE Consult has been initiated, she has limited ability to exercise.  5)  Hypothyroidism:  - Her current thyroid  function tests are consistent with appropriate replacement.  She is advised to continue levothyroxine  50 mcg p.o. daily before breakfast.     - We discussed about the correct intake of her thyroid  hormone, on empty stomach at fasting, with water, separated by at least 30 minutes from breakfast and other medications,  and separated by more than 4 hours from calcium , iron, multivitamins, acid reflux medications (PPIs). -Patient is made aware of the fact that thyroid  hormone replacement is needed for life, dose to be adjusted by periodic monitoring of thyroid  function tests.    6) Chronic Care/Health Maintenance:  -Patient is  on ACEI/ARB and Statin medications and encouraged to continue to follow up with Ophthalmology, Podiatrist at least yearly or according to recommendations, and advised to   stay away from smoking. I have recommended yearly  flu vaccine and pneumonia vaccination at least every 5 years;  and  sleep for at least 7 hours a day.  7) osteoporosis: In the interval, patient developed spinal fracture of lumbar vertebrae.  Patient is not on antiosteoporosis treatment.  She is an immediate candidate for antiosteoporosis treatment.  She is not a candidate for bisphosphonates due to CKD.  Her risk of subsequent fractures is high.  I discussed treatment options with her and she is open for Prolia  60 mg subcutaneously every 6 months.  She will be initiated as soon as it is procured for her.  She is advised to continue her calcium  and vitamin D  supplements.  - I advised patient to maintain close follow up with Jolinda Norene HERO, DO for primary care needs.   I spent  41  minutes in the care of the patient today including review of labs from CMP, Lipids, Thyroid  Function, Hematology (current and previous including abstractions from other facilities); face-to-face time discussing  her blood glucose readings/logs, discussing hypoglycemia and hyperglycemia episodes and symptoms, medications doses, her options of short and long term treatment based on the latest standards of care / guidelines;  discussion about incorporating lifestyle medicine;  and documenting the encounter. Risk reduction counseling performed per USPSTF guidelines to reduce cardiovascular risk factors.     Please refer to Patient Instructions for Blood Glucose Monitoring and Insulin /Medications Dosing Guide  in media tab for additional information. Please  also refer to  Patient Self Inventory in the Media  tab for reviewed elements of pertinent patient history.  [name] participated in the discussions, expressed understanding, and voiced agreement with the above plans.  All questions were answered to her satisfaction. she is encouraged to contact clinic should she have any questions or concerns prior to her return visit.   Follow up plan: - Return in about 6  months (around 10/09/2024) for Prolia (Jubbonti ) Today (ASAP) and P(J) NV, F/U with Pre-visit Labs, A1c -NV.  Ranny Earl, MD Phone: [phone]  Fax: [phone]  -  This note was partially dictated with voice recognition software. Similar sounding words can be transcribed inadequately or may not  be corrected upon review.  04/11/2024, 12:35 PM

## 2024-04-12 ENCOUNTER — Telehealth: Payer: Self-pay

## 2024-04-12 ENCOUNTER — Encounter (HOSPITAL_COMMUNITY): Payer: Self-pay | Admitting: *Deleted

## 2024-04-12 ENCOUNTER — Other Ambulatory Visit: Payer: Self-pay

## 2024-04-12 ENCOUNTER — Other Ambulatory Visit (HOSPITAL_COMMUNITY): Payer: Self-pay

## 2024-04-12 DIAGNOSIS — S32009D Unspecified fracture of unspecified lumbar vertebra, subsequent encounter for fracture with routine healing: Secondary | ICD-10-CM | POA: Diagnosis not present

## 2024-04-12 DIAGNOSIS — M8000XA Age-related osteoporosis with current pathological fracture, unspecified site, initial encounter for fracture: Secondary | ICD-10-CM

## 2024-04-12 DIAGNOSIS — E875 Hyperkalemia: Secondary | ICD-10-CM | POA: Diagnosis not present

## 2024-04-12 DIAGNOSIS — E782 Mixed hyperlipidemia: Secondary | ICD-10-CM | POA: Diagnosis not present

## 2024-04-12 DIAGNOSIS — E11319 Type 2 diabetes mellitus with unspecified diabetic retinopathy without macular edema: Secondary | ICD-10-CM | POA: Diagnosis not present

## 2024-04-12 DIAGNOSIS — N1831 Chronic kidney disease, stage 3a: Secondary | ICD-10-CM | POA: Diagnosis not present

## 2024-04-12 DIAGNOSIS — Z9181 History of falling: Secondary | ICD-10-CM | POA: Diagnosis not present

## 2024-04-12 DIAGNOSIS — N1832 Chronic kidney disease, stage 3b: Secondary | ICD-10-CM

## 2024-04-12 DIAGNOSIS — I129 Hypertensive chronic kidney disease with stage 1 through stage 4 chronic kidney disease, or unspecified chronic kidney disease: Secondary | ICD-10-CM | POA: Diagnosis not present

## 2024-04-12 DIAGNOSIS — I2510 Atherosclerotic heart disease of native coronary artery without angina pectoris: Secondary | ICD-10-CM | POA: Diagnosis not present

## 2024-04-12 DIAGNOSIS — F411 Generalized anxiety disorder: Secondary | ICD-10-CM | POA: Diagnosis not present

## 2024-04-12 DIAGNOSIS — M1990 Unspecified osteoarthritis, unspecified site: Secondary | ICD-10-CM | POA: Diagnosis not present

## 2024-04-12 DIAGNOSIS — D3502 Benign neoplasm of left adrenal gland: Secondary | ICD-10-CM | POA: Diagnosis not present

## 2024-04-12 DIAGNOSIS — D631 Anemia in chronic kidney disease: Secondary | ICD-10-CM | POA: Diagnosis not present

## 2024-04-12 DIAGNOSIS — H548 Legal blindness, as defined in USA: Secondary | ICD-10-CM | POA: Diagnosis not present

## 2024-04-12 DIAGNOSIS — K3184 Gastroparesis: Secondary | ICD-10-CM | POA: Diagnosis not present

## 2024-04-12 DIAGNOSIS — M109 Gout, unspecified: Secondary | ICD-10-CM | POA: Diagnosis not present

## 2024-04-12 DIAGNOSIS — N390 Urinary tract infection, site not specified: Secondary | ICD-10-CM | POA: Diagnosis not present

## 2024-04-12 DIAGNOSIS — E1143 Type 2 diabetes mellitus with diabetic autonomic (poly)neuropathy: Secondary | ICD-10-CM | POA: Diagnosis not present

## 2024-04-12 DIAGNOSIS — E1140 Type 2 diabetes mellitus with diabetic neuropathy, unspecified: Secondary | ICD-10-CM | POA: Diagnosis not present

## 2024-04-12 DIAGNOSIS — Z556 Problems related to health literacy: Secondary | ICD-10-CM | POA: Diagnosis not present

## 2024-04-12 DIAGNOSIS — E1122 Type 2 diabetes mellitus with diabetic chronic kidney disease: Secondary | ICD-10-CM | POA: Diagnosis not present

## 2024-04-12 DIAGNOSIS — E039 Hypothyroidism, unspecified: Secondary | ICD-10-CM | POA: Diagnosis not present

## 2024-04-12 DIAGNOSIS — R296 Repeated falls: Secondary | ICD-10-CM | POA: Diagnosis not present

## 2024-04-12 MED ORDER — JUBBONTI 60 MG/ML ~~LOC~~ SOSY
60.0000 mg | PREFILLED_SYRINGE | SUBCUTANEOUS | 0 refills | Status: AC
  Filled 2024-04-13: qty 1, 180d supply, fill #0

## 2024-04-12 NOTE — Telephone Encounter (Signed)
 Pharmacy Patient Advocate Encounter   Received notification from Pt Calls Messages that prior authorization for Jubboni is required/requested.   Insurance verification completed.   The patient is insured through [facility].   Per test claim: PA required; PA submitted to above mentioned insurance via Latent Key/confirmation #/EOC [id number] Status is pending

## 2024-04-12 NOTE — Telephone Encounter (Signed)
 Received notification pt's Jubbonti 60mg  q72months needs prior auth.

## 2024-04-13 ENCOUNTER — Other Ambulatory Visit: Payer: Self-pay

## 2024-04-13 NOTE — Progress Notes (Signed)
 Specialty Pharmacy Initial Fill Coordination Note  [name] is a 78 y.o. female contacted today regarding initial fill of specialty medication(s) Denosumab -bbdz (Jubbonti )   Patient requested Courier to Provider Office   Delivery date: 04/17/24   Verified address: [facility]   Medication will be filled on: 04/16/24   Patient is aware of $297 copayment.

## 2024-04-13 NOTE — Progress Notes (Signed)
 Pharmacy Patient Advocate Encounter  Insurance verification completed.   The patient is insured through [facility]   Ran test claim for Jubbonti . Co-pay is $297.  This test claim was processed through [facility]- copay amounts may vary at other pharmacies due to pharmacy/plan contracts, or as the patient moves through the different stages of their insurance plan.

## 2024-04-16 ENCOUNTER — Other Ambulatory Visit: Payer: Self-pay

## 2024-04-16 DIAGNOSIS — E1122 Type 2 diabetes mellitus with diabetic chronic kidney disease: Secondary | ICD-10-CM

## 2024-04-16 MED ORDER — FREESTYLE LIBRE 3 READER DEVI: 0 refills | Status: AC

## 2024-04-16 NOTE — Telephone Encounter (Signed)
 Pharmacy Patient Advocate Encounter  Received notification from [facility] that Prior Authorization for Jubbonti  has been APPROVED from 04/12/2024 to 04/12/2025

## 2024-04-17 ENCOUNTER — Telehealth: Payer: Self-pay | Admitting: "Endocrinology

## 2024-04-17 DIAGNOSIS — M79674 Pain in right toe(s): Secondary | ICD-10-CM | POA: Diagnosis not present

## 2024-04-17 DIAGNOSIS — M79675 Pain in left toe(s): Secondary | ICD-10-CM | POA: Diagnosis not present

## 2024-04-17 DIAGNOSIS — B351 Tinea unguium: Secondary | ICD-10-CM | POA: Diagnosis not present

## 2024-04-17 DIAGNOSIS — E1142 Type 2 diabetes mellitus with diabetic polyneuropathy: Secondary | ICD-10-CM | POA: Diagnosis not present

## 2024-04-17 DIAGNOSIS — L84 Corns and callosities: Secondary | ICD-10-CM | POA: Diagnosis not present

## 2024-04-17 NOTE — Telephone Encounter (Signed)
 Patient's prolia  is here. When does she need to be scheduled?

## 2024-04-18 NOTE — Telephone Encounter (Signed)
 Any time it is convenient for her next week.

## 2024-04-24 ENCOUNTER — Ambulatory Visit

## 2024-04-24 VITALS — BP 134/50 | HR 60

## 2024-04-24 DIAGNOSIS — M8000XA Age-related osteoporosis with current pathological fracture, unspecified site, initial encounter for fracture: Secondary | ICD-10-CM | POA: Diagnosis not present

## 2024-04-24 MED ORDER — DENOSUMAB-BBDZ 60 MG/ML ~~LOC~~ SOSY
60.0000 mg | PREFILLED_SYRINGE | Freq: Once | SUBCUTANEOUS | Status: AC
  Administered 2024-04-24: 60 mg via SUBCUTANEOUS

## 2024-04-24 NOTE — Progress Notes (Signed)
 Pt seen for a Nurse's Visit to receive Jubbonti  injection. Pt provided her Jubbonti  (Lot #[id number], Exp. Date 08/21/2025). Discussed with pt possible side effects and answered all of pt's questions. Jubbonti  60mg  give SQ in upper R abdomen without difficulty. Pt waited the required 15 minutes post injection, no adverse reactions noted.

## 2024-04-25 ENCOUNTER — Telehealth: Payer: Self-pay

## 2024-04-25 DIAGNOSIS — M1990 Unspecified osteoarthritis, unspecified site: Secondary | ICD-10-CM | POA: Diagnosis not present

## 2024-04-25 DIAGNOSIS — Z9181 History of falling: Secondary | ICD-10-CM | POA: Diagnosis not present

## 2024-04-25 DIAGNOSIS — F411 Generalized anxiety disorder: Secondary | ICD-10-CM | POA: Diagnosis not present

## 2024-04-25 DIAGNOSIS — E782 Mixed hyperlipidemia: Secondary | ICD-10-CM | POA: Diagnosis not present

## 2024-04-25 DIAGNOSIS — E11319 Type 2 diabetes mellitus with unspecified diabetic retinopathy without macular edema: Secondary | ICD-10-CM | POA: Diagnosis not present

## 2024-04-25 DIAGNOSIS — K3184 Gastroparesis: Secondary | ICD-10-CM | POA: Diagnosis not present

## 2024-04-25 DIAGNOSIS — D3502 Benign neoplasm of left adrenal gland: Secondary | ICD-10-CM | POA: Diagnosis not present

## 2024-04-25 DIAGNOSIS — M109 Gout, unspecified: Secondary | ICD-10-CM | POA: Diagnosis not present

## 2024-04-25 DIAGNOSIS — E039 Hypothyroidism, unspecified: Secondary | ICD-10-CM | POA: Diagnosis not present

## 2024-04-25 DIAGNOSIS — N390 Urinary tract infection, site not specified: Secondary | ICD-10-CM | POA: Diagnosis not present

## 2024-04-25 DIAGNOSIS — R296 Repeated falls: Secondary | ICD-10-CM | POA: Diagnosis not present

## 2024-04-25 DIAGNOSIS — D631 Anemia in chronic kidney disease: Secondary | ICD-10-CM | POA: Diagnosis not present

## 2024-04-25 DIAGNOSIS — I2510 Atherosclerotic heart disease of native coronary artery without angina pectoris: Secondary | ICD-10-CM | POA: Diagnosis not present

## 2024-04-25 DIAGNOSIS — I129 Hypertensive chronic kidney disease with stage 1 through stage 4 chronic kidney disease, or unspecified chronic kidney disease: Secondary | ICD-10-CM | POA: Diagnosis not present

## 2024-04-25 DIAGNOSIS — H548 Legal blindness, as defined in USA: Secondary | ICD-10-CM | POA: Diagnosis not present

## 2024-04-25 DIAGNOSIS — E875 Hyperkalemia: Secondary | ICD-10-CM | POA: Diagnosis not present

## 2024-04-25 DIAGNOSIS — S32009D Unspecified fracture of unspecified lumbar vertebra, subsequent encounter for fracture with routine healing: Secondary | ICD-10-CM | POA: Diagnosis not present

## 2024-04-25 DIAGNOSIS — E1122 Type 2 diabetes mellitus with diabetic chronic kidney disease: Secondary | ICD-10-CM | POA: Diagnosis not present

## 2024-04-25 DIAGNOSIS — E1143 Type 2 diabetes mellitus with diabetic autonomic (poly)neuropathy: Secondary | ICD-10-CM | POA: Diagnosis not present

## 2024-04-25 DIAGNOSIS — N1831 Chronic kidney disease, stage 3a: Secondary | ICD-10-CM | POA: Diagnosis not present

## 2024-04-25 DIAGNOSIS — E1140 Type 2 diabetes mellitus with diabetic neuropathy, unspecified: Secondary | ICD-10-CM | POA: Diagnosis not present

## 2024-04-25 DIAGNOSIS — Z556 Problems related to health literacy: Secondary | ICD-10-CM | POA: Diagnosis not present

## 2024-04-25 NOTE — Telephone Encounter (Signed)
Tried to call pt, did not receive an answer and was unable to leave a message. 

## 2024-04-25 NOTE — Telephone Encounter (Signed)
 Pt voiced concern over her glucose readings. Pt's Libre 2 CGM device uploaded and data printed. Pt is injecting Basaglar  8 units daily with breakfast but did not take any insulin  yesterday morning. Dr. Lenis evaluated pt's CGM data. Advised pt no changes recommended, continue injecting Basaglar  8 units daily with breakfast per Dr.Nida. Pt voiced understanding.

## 2024-04-26 ENCOUNTER — Telehealth: Payer: Self-pay

## 2024-04-26 DIAGNOSIS — R296 Repeated falls: Secondary | ICD-10-CM | POA: Diagnosis not present

## 2024-04-26 DIAGNOSIS — M1990 Unspecified osteoarthritis, unspecified site: Secondary | ICD-10-CM | POA: Diagnosis not present

## 2024-04-26 DIAGNOSIS — D631 Anemia in chronic kidney disease: Secondary | ICD-10-CM | POA: Diagnosis not present

## 2024-04-26 DIAGNOSIS — E039 Hypothyroidism, unspecified: Secondary | ICD-10-CM | POA: Diagnosis not present

## 2024-04-26 DIAGNOSIS — D3502 Benign neoplasm of left adrenal gland: Secondary | ICD-10-CM | POA: Diagnosis not present

## 2024-04-26 DIAGNOSIS — E782 Mixed hyperlipidemia: Secondary | ICD-10-CM | POA: Diagnosis not present

## 2024-04-26 DIAGNOSIS — E1122 Type 2 diabetes mellitus with diabetic chronic kidney disease: Secondary | ICD-10-CM | POA: Diagnosis not present

## 2024-04-26 DIAGNOSIS — F411 Generalized anxiety disorder: Secondary | ICD-10-CM | POA: Diagnosis not present

## 2024-04-26 DIAGNOSIS — N390 Urinary tract infection, site not specified: Secondary | ICD-10-CM | POA: Diagnosis not present

## 2024-04-26 DIAGNOSIS — E11319 Type 2 diabetes mellitus with unspecified diabetic retinopathy without macular edema: Secondary | ICD-10-CM | POA: Diagnosis not present

## 2024-04-26 DIAGNOSIS — I129 Hypertensive chronic kidney disease with stage 1 through stage 4 chronic kidney disease, or unspecified chronic kidney disease: Secondary | ICD-10-CM | POA: Diagnosis not present

## 2024-04-26 DIAGNOSIS — I2510 Atherosclerotic heart disease of native coronary artery without angina pectoris: Secondary | ICD-10-CM | POA: Diagnosis not present

## 2024-04-26 DIAGNOSIS — E875 Hyperkalemia: Secondary | ICD-10-CM | POA: Diagnosis not present

## 2024-04-26 DIAGNOSIS — K3184 Gastroparesis: Secondary | ICD-10-CM | POA: Diagnosis not present

## 2024-04-26 DIAGNOSIS — E1143 Type 2 diabetes mellitus with diabetic autonomic (poly)neuropathy: Secondary | ICD-10-CM | POA: Diagnosis not present

## 2024-04-26 DIAGNOSIS — S32009D Unspecified fracture of unspecified lumbar vertebra, subsequent encounter for fracture with routine healing: Secondary | ICD-10-CM | POA: Diagnosis not present

## 2024-04-26 DIAGNOSIS — H548 Legal blindness, as defined in USA: Secondary | ICD-10-CM | POA: Diagnosis not present

## 2024-04-26 DIAGNOSIS — Z9181 History of falling: Secondary | ICD-10-CM | POA: Diagnosis not present

## 2024-04-26 DIAGNOSIS — M109 Gout, unspecified: Secondary | ICD-10-CM | POA: Diagnosis not present

## 2024-04-26 DIAGNOSIS — E1140 Type 2 diabetes mellitus with diabetic neuropathy, unspecified: Secondary | ICD-10-CM | POA: Diagnosis not present

## 2024-04-26 DIAGNOSIS — Z556 Problems related to health literacy: Secondary | ICD-10-CM | POA: Diagnosis not present

## 2024-04-26 DIAGNOSIS — N1831 Chronic kidney disease, stage 3a: Secondary | ICD-10-CM | POA: Diagnosis not present

## 2024-04-26 NOTE — Telephone Encounter (Signed)
 Patient called to ask about PAP re-enrollment with [facility] for Basaglar .   Patient would like application mailed to her home.

## 2024-04-30 ENCOUNTER — Telehealth: Payer: Self-pay

## 2024-04-30 NOTE — Telephone Encounter (Signed)
 Pt called stating her glucose reading through the night went down to 93. Advised pt her reading was WNL.  Reminded pt Dr.Nida requested her to contact the office if her glucose was below 70 or above 200 for a weekly average per Dr. Barbette last office visit note. Pt voiced understanding

## 2024-05-01 ENCOUNTER — Ambulatory Visit

## 2024-05-01 DIAGNOSIS — E1122 Type 2 diabetes mellitus with diabetic chronic kidney disease: Secondary | ICD-10-CM | POA: Diagnosis not present

## 2024-05-01 DIAGNOSIS — I129 Hypertensive chronic kidney disease with stage 1 through stage 4 chronic kidney disease, or unspecified chronic kidney disease: Secondary | ICD-10-CM | POA: Diagnosis not present

## 2024-05-01 DIAGNOSIS — S32009D Unspecified fracture of unspecified lumbar vertebra, subsequent encounter for fracture with routine healing: Secondary | ICD-10-CM | POA: Diagnosis not present

## 2024-05-01 DIAGNOSIS — N1831 Chronic kidney disease, stage 3a: Secondary | ICD-10-CM | POA: Diagnosis not present

## 2024-05-01 NOTE — Telephone Encounter (Signed)
 Application placed in mail.

## 2024-05-03 NOTE — Telephone Encounter (Signed)
 Provider page 8 with Mliss SQUIBB.

## 2024-05-10 DIAGNOSIS — D631 Anemia in chronic kidney disease: Secondary | ICD-10-CM | POA: Diagnosis not present

## 2024-05-10 DIAGNOSIS — E039 Hypothyroidism, unspecified: Secondary | ICD-10-CM | POA: Diagnosis not present

## 2024-05-10 DIAGNOSIS — Z9181 History of falling: Secondary | ICD-10-CM | POA: Diagnosis not present

## 2024-05-10 DIAGNOSIS — M109 Gout, unspecified: Secondary | ICD-10-CM | POA: Diagnosis not present

## 2024-05-10 DIAGNOSIS — M1990 Unspecified osteoarthritis, unspecified site: Secondary | ICD-10-CM | POA: Diagnosis not present

## 2024-05-10 DIAGNOSIS — H548 Legal blindness, as defined in USA: Secondary | ICD-10-CM | POA: Diagnosis not present

## 2024-05-10 DIAGNOSIS — S32009D Unspecified fracture of unspecified lumbar vertebra, subsequent encounter for fracture with routine healing: Secondary | ICD-10-CM | POA: Diagnosis not present

## 2024-05-10 DIAGNOSIS — Z556 Problems related to health literacy: Secondary | ICD-10-CM | POA: Diagnosis not present

## 2024-05-10 DIAGNOSIS — E1143 Type 2 diabetes mellitus with diabetic autonomic (poly)neuropathy: Secondary | ICD-10-CM | POA: Diagnosis not present

## 2024-05-10 DIAGNOSIS — E1140 Type 2 diabetes mellitus with diabetic neuropathy, unspecified: Secondary | ICD-10-CM | POA: Diagnosis not present

## 2024-05-10 DIAGNOSIS — E875 Hyperkalemia: Secondary | ICD-10-CM | POA: Diagnosis not present

## 2024-05-10 DIAGNOSIS — K3184 Gastroparesis: Secondary | ICD-10-CM | POA: Diagnosis not present

## 2024-05-10 DIAGNOSIS — R296 Repeated falls: Secondary | ICD-10-CM | POA: Diagnosis not present

## 2024-05-10 DIAGNOSIS — E782 Mixed hyperlipidemia: Secondary | ICD-10-CM | POA: Diagnosis not present

## 2024-05-10 DIAGNOSIS — E1122 Type 2 diabetes mellitus with diabetic chronic kidney disease: Secondary | ICD-10-CM | POA: Diagnosis not present

## 2024-05-10 DIAGNOSIS — N1831 Chronic kidney disease, stage 3a: Secondary | ICD-10-CM | POA: Diagnosis not present

## 2024-05-10 DIAGNOSIS — I129 Hypertensive chronic kidney disease with stage 1 through stage 4 chronic kidney disease, or unspecified chronic kidney disease: Secondary | ICD-10-CM | POA: Diagnosis not present

## 2024-05-10 DIAGNOSIS — N390 Urinary tract infection, site not specified: Secondary | ICD-10-CM | POA: Diagnosis not present

## 2024-05-10 DIAGNOSIS — F411 Generalized anxiety disorder: Secondary | ICD-10-CM | POA: Diagnosis not present

## 2024-05-10 DIAGNOSIS — D3502 Benign neoplasm of left adrenal gland: Secondary | ICD-10-CM | POA: Diagnosis not present

## 2024-05-10 DIAGNOSIS — E11319 Type 2 diabetes mellitus with unspecified diabetic retinopathy without macular edema: Secondary | ICD-10-CM | POA: Diagnosis not present

## 2024-05-10 DIAGNOSIS — I2510 Atherosclerotic heart disease of native coronary artery without angina pectoris: Secondary | ICD-10-CM | POA: Diagnosis not present

## 2024-05-11 DIAGNOSIS — M109 Gout, unspecified: Secondary | ICD-10-CM | POA: Diagnosis not present

## 2024-05-11 DIAGNOSIS — E782 Mixed hyperlipidemia: Secondary | ICD-10-CM | POA: Diagnosis not present

## 2024-05-11 DIAGNOSIS — F411 Generalized anxiety disorder: Secondary | ICD-10-CM | POA: Diagnosis not present

## 2024-05-11 DIAGNOSIS — D631 Anemia in chronic kidney disease: Secondary | ICD-10-CM | POA: Diagnosis not present

## 2024-05-11 DIAGNOSIS — H548 Legal blindness, as defined in USA: Secondary | ICD-10-CM | POA: Diagnosis not present

## 2024-05-11 DIAGNOSIS — E1143 Type 2 diabetes mellitus with diabetic autonomic (poly)neuropathy: Secondary | ICD-10-CM | POA: Diagnosis not present

## 2024-05-11 DIAGNOSIS — E1122 Type 2 diabetes mellitus with diabetic chronic kidney disease: Secondary | ICD-10-CM | POA: Diagnosis not present

## 2024-05-11 DIAGNOSIS — E1140 Type 2 diabetes mellitus with diabetic neuropathy, unspecified: Secondary | ICD-10-CM | POA: Diagnosis not present

## 2024-05-11 DIAGNOSIS — R296 Repeated falls: Secondary | ICD-10-CM | POA: Diagnosis not present

## 2024-05-11 DIAGNOSIS — K3184 Gastroparesis: Secondary | ICD-10-CM | POA: Diagnosis not present

## 2024-05-11 DIAGNOSIS — E11319 Type 2 diabetes mellitus with unspecified diabetic retinopathy without macular edema: Secondary | ICD-10-CM | POA: Diagnosis not present

## 2024-05-11 DIAGNOSIS — Z556 Problems related to health literacy: Secondary | ICD-10-CM | POA: Diagnosis not present

## 2024-05-11 DIAGNOSIS — Z9181 History of falling: Secondary | ICD-10-CM | POA: Diagnosis not present

## 2024-05-11 DIAGNOSIS — S32009D Unspecified fracture of unspecified lumbar vertebra, subsequent encounter for fracture with routine healing: Secondary | ICD-10-CM | POA: Diagnosis not present

## 2024-05-11 DIAGNOSIS — I2510 Atherosclerotic heart disease of native coronary artery without angina pectoris: Secondary | ICD-10-CM | POA: Diagnosis not present

## 2024-05-11 DIAGNOSIS — N390 Urinary tract infection, site not specified: Secondary | ICD-10-CM | POA: Diagnosis not present

## 2024-05-11 DIAGNOSIS — E039 Hypothyroidism, unspecified: Secondary | ICD-10-CM | POA: Diagnosis not present

## 2024-05-11 DIAGNOSIS — E875 Hyperkalemia: Secondary | ICD-10-CM | POA: Diagnosis not present

## 2024-05-11 DIAGNOSIS — D3502 Benign neoplasm of left adrenal gland: Secondary | ICD-10-CM | POA: Diagnosis not present

## 2024-05-11 DIAGNOSIS — I129 Hypertensive chronic kidney disease with stage 1 through stage 4 chronic kidney disease, or unspecified chronic kidney disease: Secondary | ICD-10-CM | POA: Diagnosis not present

## 2024-05-11 DIAGNOSIS — N1831 Chronic kidney disease, stage 3a: Secondary | ICD-10-CM | POA: Diagnosis not present

## 2024-05-11 DIAGNOSIS — M1990 Unspecified osteoarthritis, unspecified site: Secondary | ICD-10-CM | POA: Diagnosis not present

## 2024-05-15 NOTE — Telephone Encounter (Addendum)
 All pages received.   PAP: re-enrollment application for Basaglar  has been submitted to [employer], via fax  Please send new 4 month RX to [facility] pharmacy for patients re-enrollment. Thanks!

## 2024-05-23 ENCOUNTER — Telehealth: Payer: Self-pay | Admitting: Pharmacist

## 2024-05-23 DIAGNOSIS — E119 Type 2 diabetes mellitus without complications: Secondary | ICD-10-CM

## 2024-05-23 MED ORDER — BASAGLAR KWIKPEN 100 UNIT/ML ~~LOC~~ SOPN: 8.0000 [IU] | PEN_INJECTOR | Freq: Every day | SUBCUTANEOUS | 3 refills | Status: AC

## 2024-05-23 NOTE — Telephone Encounter (Signed)
" ° °  Patient enrolled in the [employer] patient assistance program for Basaglar .  Updated RX escribed to [employer] order (pharmacy for [employer] patient assistance).  Patient is stable on current regimen.  She is also under the care of Endocrinology.  Will continue to follow.  Quenton Recendez Dattero Valerie Fredin, PharmD, BCACP, CPP Clinical Pharmacist, [facility]   "

## 2024-05-25 NOTE — Telephone Encounter (Signed)
 PAP: Patient assistance application for Basaglar  has been approved by P[employer] Companies: [employer] from 05/24/24 to 05/23/25.   Medication should be delivered to: Home.   For further shipping updates, please contact [employer] at [phone].

## 2024-06-18 ENCOUNTER — Ambulatory Visit

## 2024-06-18 VITALS — BP 100/40 | HR 60 | Ht 61.0 in | Wt 138.0 lb

## 2024-06-18 DIAGNOSIS — Z0000 Encounter for general adult medical examination without abnormal findings: Secondary | ICD-10-CM

## 2024-06-18 NOTE — Progress Notes (Signed)
 "  Chief Complaint  Patient presents with   Medicare Wellness     Subjective:   [name] is a 79 y.o. female who presents for a Medicare Annual Wellness Visit.  Visit info / Clinical Intake: Medicare Wellness Visit Type:: Subsequent Annual Wellness Visit Living arrangements:: lives with spouse/significant other Patient's Overall Health Status Rating: very good Typical amount of pain: some (pt has arthritis in hands/legs) Does pain affect daily life?: no Are you currently prescribed opioids?: no  Dietary Habits and Nutritional Risks How many meals a day?: 3 Eats fruit and vegetables daily?: yes Most meals are obtained by: preparing own meals In the last 2 weeks, have you had any of the following?: none Diabetic:: (!) yes Any non-healing wounds?: no How often do you check your BS?: continuous glucose monitor Would you like to be referred to a Nutritionist or for Diabetic Management? : no  Functional Status Activities of Daily Living (to include ambulation/medication): Independent Ambulation: Independent with device- listed below Home Assistive Devices/Equipment: Johna Finder (specify Type) Medication Administration: Needs assistance (comment) (pt's husband) Home Management (perform basic housework or laundry): Needs assistance (comment) (pt's husband) Manage your own finances?: yes (w/pt's husband) Primary transportation is: family / friends Concerns about vision?: no *vision screening is required for [facility]* (upcoming 2/26) Concerns about hearing?: no  Fall Screening Falls in the past year?: 1 Number of falls in past year: 1 Was there an injury with Fall?: 1 Fall Risk Category Calculator: 3 Patient Fall Risk Level: High Fall Risk  Fall Risk Patient at Risk for Falls Due to: Impaired mobility; Impaired balance/gait Fall risk Follow up: Falls evaluation completed; Education provided  Home and Transportation Safety: All rugs have non-skid backing?: yes All stairs or  steps have railings?: yes Have non-skid surface in bathtub or shower?: yes Good home lighting?: yes Regular seat belt use?: yes Hospital stays in the last year:: (!) yes How many hospital stays:: 3 Reason: fracture back due fall  Cognitive Assessment Difficulty concentrating, remembering, or making decisions? : no Will 6CIT or Mini Cog be Completed: yes What year is it?: 0 points What month is it?: 0 points Give patient an address phrase to remember (5 components): [address] [place] About what time is it?: 0 points Count backwards from 20 to 1: 0 points Say the months of the year in reverse: 0 points Repeat the address phrase from earlier: 0 points 6 CIT Score: 0 points  Advance Directives (For Healthcare) Does Patient Have a Medical Advance Directive?: No Would patient like information on creating a medical advance directive?: No - Patient declined  Reviewed/Updated  Reviewed/Updated: Reviewed All (Medical, Surgical, Family, Medications, Allergies, Care Teams, Patient Goals)    Allergies (verified) Aspirin, Ciprofloxacin, Codeine, Hydrocodone -acetaminophen , Losartan , Micardis [telmisartan], Nexium [esomeprazole magnesium ], Niaspan [niacin er (antihyperlipidemic)], Nsaids, Onglyza [saxagliptin ], Other, Oxycodone  hcl, Rofecoxib, Simvastatin, Tequin [gatifloxacin], Welchol [colesevelam hcl], and Amoxicillin   Current Medications (verified) Outpatient Encounter Medications as of 06/18/2024  Medication Sig   acetaminophen  (TYLENOL ) 325 MG tablet Take 2 tablets (650 mg total) by mouth every 6 (six) hours as needed for mild pain (pain score 1-3) or fever (or Fever >/= 101).   amLODipine  (NORVASC ) 5 MG tablet Take 1 tablet (5 mg total) by mouth daily.   atorvastatin  (LIPITOR) 10 MG tablet Take 1 tablet (10 mg total) by mouth daily.   carvedilol  (COREG ) 6.25 MG tablet Take 1 tablet (6.25 mg total) by mouth 2 (two) times daily with a meal.  Continuous Glucose Receiver  (FREESTYLE LIBRE 3 READER) DEVI Use to monitor glucose continuously as instructed.   denosumab -bbdz (JUBBONTI ) 60 MG/ML SOSY injection Inject 60 mg into the skin every 6 (six) months.   insulin  aspart (NOVOLOG ) 100 UNIT/ML FlexPen Inject 0-10 Units into the skin 3 (three) times daily with meals. Short acting insulin  per sliding scale 0-10 ---:-- Insulin  injection 0-10 Units 0-10 Units Subcutaneous, 3 times daily with meals CBG 70 - 120: 0 unit CBG 121 - 150: 0 unit  CBG 151 - 200: 1 unit CBG 201 - 250: 2 units CBG 251 - 300: 4 units CBG 301 - 350: 6 units  CBG 351 - 400: 8 units  CBG > 400: 10 units   Insulin  Glargine (BASAGLAR  KWIKPEN) 100 UNIT/ML Inject 8 Units into the skin at bedtime.   ketorolac  (ACULAR ) 0.5 % ophthalmic solution Place 1 drop into the right eye 2 (two) times daily.   levothyroxine  (SYNTHROID ) 50 MCG tablet TAKE ONE TABLET ONCE DAILY BEFORE BREAKFAST   losartan  (COZAAR ) 25 MG tablet Take 1 tablet (25 mg total) by mouth daily. **dose reduction   senna-docusate (SENOKOT-S) 8.6-50 MG tablet Take 2 tablets by mouth at bedtime.   sodium bicarbonate  650 MG tablet Take 1 tablet (650 mg total) by mouth 2 (two) times daily.   traZODone  (DESYREL ) 50 MG tablet Take 1 tablet (50 mg total) by mouth at bedtime as needed for sleep.   HYDROcodone -acetaminophen  (NORCO/VICODIN) 5-325 MG tablet Take 1 tablet by mouth every 8 (eight) hours as needed. (Patient not taking: Reported on 06/18/2024)   No facility-administered encounter medications on file as of 06/18/2024.    History: Past Medical History:  Diagnosis Date   Anal fistula    Anemia in chronic renal disease    Aranesp  injection --  when Hg <11, last injection 12-24-14.   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    CKD (chronic kidney disease), stage III [facility])    nephrologist--  dr froylan-- LOV  07-09-2016   Complication of anesthesia    post-op confusion    Constipation    Coronary artery disease    Diabetic gastroparesis (HCC)    Diabetic retinopathy (HCC)    bilateral --  monitored by dr elner   Diverticulosis of colon    GAD (generalized anxiety disorder)    GERD (gastroesophageal reflux disease)    Hip fracture requiring operative repair (HCC)    History of colon polyps    benign   History of esophagitis    History of GI bleed    upper 2009  due to esophagitis  &  2002  due to Mallory-Weiss tear   History of hyperkalemia    pt had previously been canceled twice dos due to elevated K+, 07-29-2016 last date cancelled -- pt visited pcp same day treated w/ kayexelate and K+ came down, pt brought all her medication's in to pcp office and found pt was taking losartan  that had been discontinued due to ckd, pcp stated this was cause of elevated K+   History of Mallory-Weiss syndrome    12/ 2002--  resolved   History of rectal abscess  12-29-2004  bedside I & D   Hyperlipidemia    Hypertension    Hypothyroidism    LAFB (left anterior fascicular block)    Right bundle branch block    Sacral decubitus ulcer    since 2014- 01-02-15 remains with wound gauze dressing changes daily.   Type II diabetes mellitus (HCC)    Past Surgical History:  Procedure Laterality Date   APPLICATION OF A-CELL OF EXTREMITY N/A 04/07/2015   Procedure: A CELL PLACMENT;  Surgeon: Estefana RAMAN Dillingham, DO;  Location: [facility] OR;  Service: Plastics;  Laterality: N/A;   CARDIOVASCULAR STRESS TEST  12/30/2004   normal perfusion study/  no ischemia or infartion/  normal LV wall function and wall motion , ef 66%   CATARACT EXTRACTION W/ INTRAOCULAR LENS  IMPLANT, BILATERAL  1995   COLONOSCOPY  2008   w/Dr.Brodie     COLONOSCOPY W/ POLYPECTOMY  last one 2008   COMPRESSION HIP SCREW Right 05/18/2014   Procedure: COMPRESSION HIP;  Surgeon: Taft FORBES Minerva, MD;  Location: [facility];  Service: Orthopedics;  Laterality: Right;   ESOPHAGOGASTRODUODENOSCOPY  last one 01-09-2011   EVALUATION UNDER ANESTHESIA WITH FISTULECTOMY N/A 01/06/2015   Procedure: EXAM UNDER ANESTHESIA , placement of seton;  Surgeon: Krystal Russell, MD;  Location: [facility];  Service: General;  Laterality: N/A;   I & D EXTREMITY N/A 04/07/2015   Procedure: IRRIGATION AND DEBRIDEMENT ISCHIAL ULCER;  Surgeon: Estefana RAMAN Dillingham, DO;  Location: [facility];  Service: Plastics;  Laterality: N/A;   INCISION AND DRAINAGE OF WOUND N/A 09/30/2014   Procedure: IRRIGATION AND DEBRIDEMENT SACRAL WOUND, EXCISION OF PERIRECTAL TRACT WITH PLACEMENT OF ACCELL;  Surgeon: Estefana Reichert, DO;  Location:  SURGERY CENTER;  Service: Plastics;  Laterality: N/A;   LIGATION OF INTERNAL FISTULA TRACT N/A 10/22/2016   Procedure: LIGATION OF INTERNAL FISTULA TRACT;  Surgeon: Debby Hila, MD;  Location: [facility];  Service: General;  Laterality: N/A;   ORIF FEMUR FRACTURE Left 10/09/2012   Procedure: OPEN REDUCTION INTERNAL FIXATION (ORIF) DISTAL FEMUR FRACTURE;  Surgeon: Ozell VEAR Bruch, MD;  Location: [facility] OR;  Service: Orthopedics;  Laterality: Left;   RETINOPATHY SURGERY Bilateral 1980's?   SKIN CANCER EXCISION  03/2023   TRANSTHORACIC ECHOCARDIOGRAM  02/18/2011   mild LVH,  ef 55-60%,  grade I diastolic dysfunction/  mild TR/  RV systolic pressure increased consistant with moderate pulmonary hypertension   Family History  Problem Relation Age of Onset   Coronary artery disease Mother 17   Heart disease Mother    Diabetes Mother    Colon cancer Father 33   Prostate cancer Father    Diabetes Father    Coronary artery disease Sister 64   Diabetes Sister    Heart disease Sister    Diabetes Sister    Diabetes Sister    Diabetes Sister     Diabetes Sister    Diabetes Sister    Diabetes Maternal Grandmother    Esophageal cancer Neg Hx    Stomach cancer Neg Hx    Rectal cancer Neg Hx    Social History   Occupational History   Occupation: Retired    Associate Professor: RETIRED  Tobacco Use   Smoking status: Never   Smokeless tobacco: Never  Vaping Use   Vaping status: Never Used  Substance and Sexual Activity   Alcohol use: No   Drug use: No   Sexual activity: Not Currently   Tobacco Counseling Counseling given: Yes  SDOH Screenings  Food Insecurity: Food Insecurity Present (06/18/2024)  Housing: Low Risk (06/18/2024)  Transportation Needs: No Transportation Needs (06/18/2024)  Utilities: Not At Risk (06/18/2024)  Alcohol Screen: Low Risk (05/27/2023)  Depression (PHQ2-9): Low Risk (06/18/2024)  Financial Resource Strain: Low Risk (05/27/2023)  Physical Activity: Insufficiently Active (06/18/2024)  Social Connections: Moderately Integrated (06/18/2024)  Stress: No Stress Concern Present (06/18/2024)  Tobacco Use: Low Risk (06/18/2024)  Health Literacy: Adequate Health Literacy (06/18/2024)   See flowsheets for full screening details  Depression Screen PHQ 2 & 9 Depression Scale- Over the past 2 weeks, how often have you been bothered by any of the following problems? Little interest or pleasure in doing things: 0 Feeling down, depressed, or hopeless (PHQ Adolescent also includes...irritable): 0 PHQ-2 Total Score: 0 Trouble falling or staying asleep, or sleeping too much: 0 Feeling tired or having little energy: 0 Poor appetite or overeating (PHQ Adolescent also includes...weight loss): 0 Feeling bad about yourself - or that you are a failure or have let yourself or your family down: 0 Trouble concentrating on things, such as reading the newspaper or watching television (PHQ Adolescent also includes...like school work): 0 Moving or speaking so slowly that other people could have noticed. Or the opposite - being so fidgety or  restless that you have been moving around a lot more than usual: 0 Thoughts that you would be better off dead, or of hurting yourself in some way: 0 PHQ-9 Total Score: 0 If you checked off any problems, how difficult have these problems made it for you to do your work, take care of things at home, or get along with other people?: Not difficult at all     Goals Addressed   None          Objective:    There were no vitals filed for this visit. There is no height or weight on file to calculate BMI.  Hearing/Vision screen No results found. Immunizations and Health Maintenance Health Maintenance  Topic Date Due   Colonoscopy  11/15/2021   COVID-19 Vaccine (7 - 2025-26 season) 01/23/2024   HEMOGLOBIN A1C  08/22/2024   Diabetic kidney evaluation - Urine ACR  08/29/2024   OPHTHALMOLOGY EXAM  12/25/2024   Diabetic kidney evaluation - eGFR measurement  02/23/2025   FOOT EXAM  03/23/2025   Medicare Annual Wellness (AWV)  06/18/2025   Bone Density Scan  07/20/2025   DTaP/Tdap/Td (3 - Td or Tdap) 03/02/2033   Pneumococcal Vaccine: 50+ Years  Completed   Influenza Vaccine  Completed   Hepatitis C Screening  Completed   Zoster Vaccines- Shingrix  Completed   Meningococcal B Vaccine  Aged Out        Assessment/Plan:  This is a routine wellness examination for [place].  Patient Care Team: Jolinda Norene HERO, DO as PCP - General (Family Medicine) Elner Arley LABOR, MD as Consulting Physician (Ophthalmology) Froylan Sharper, MD as Consulting Physician (Nephrology) Lavona Agent, MD as Consulting Physician (Cardiology) Obie Princella HERO, MD (Inactive) as Consulting Physician (Gastroenterology) Frances Sharper RAMAN, LCSW as [facility] Care Management (Licensed Clinical Social Worker) Billee, Mliss BIRCH, RPH-CPP (Pharmacist) Lenis Ethelle ORN, MD as Consulting Physician (Endocrinology) Prescilla Beams, MD as Consulting Physician (Nephrology) Roddie Bring, DPM as  Consulting Physician (Podiatry) Eda Baxter, MD as Referring Physician (Dermatology)  I have personally reviewed and noted the following in the patients chart:   Medical and social history Use of alcohol, tobacco or illicit drugs  Current medications and supplements including opioid prescriptions. Functional  ability and status Nutritional status Physical activity Advanced directives List of other physicians Hospitalizations, surgeries, and ER visits in previous 12 months Vitals Screenings to include cognitive, depression, and falls Referrals and appointments  No orders of the defined types were placed in this encounter.  In addition, I have reviewed and discussed with patient certain preventive protocols, quality metrics, and best practice recommendations. A written personalized care plan for preventive services as well as general preventive health recommendations were provided to patient.   Ozie Ned, CMA   06/18/2024   Return in 1 year (on 06/18/2025).  After Visit Summary: (MyChart) Due to this being a telephonic visit, the after visit summary with patients personalized plan was offered to patient via MyChart   Nurse Notes: Per pt aged out of Colonoscopy--don't have to do anymore, pt declined Covid vaccines "

## 2024-06-18 NOTE — Patient Instructions (Signed)
 [name],  Thank you for taking the time for your Medicare Wellness Visit. I appreciate your continued commitment to your health goals. Please review the care plan we discussed, and feel free to reach out if I can assist you further.  Please note that Annual Wellness Visits do not include a physical exam. Some assessments may be limited, especially if the visit was conducted virtually. If needed, we may recommend an in-person follow-up with your provider.  Ongoing Care Seeing your primary care provider every 3 to 6 months helps us  monitor your health and provide consistent, personalized care.   Referrals If a referral was made during today's visit and you haven't received any updates within two weeks, please contact the referred provider directly to check on the status.  Recommended Screenings:  Health Maintenance  Topic Date Due   Colon Cancer Screening  11/15/2021   COVID-19 Vaccine (7 - 2025-26 season) 01/23/2024   Medicare Annual Wellness Visit  05/26/2024   Hemoglobin A1C  08/22/2024   Kidney health urinalysis for diabetes  08/29/2024   Eye exam for diabetics  12/25/2024   Yearly kidney function blood test for diabetes  02/23/2025   Complete foot exam   03/23/2025   Osteoporosis screening with Bone Density Scan  07/20/2025   DTaP/Tdap/Td vaccine (3 - Td or Tdap) 03/02/2033   Pneumococcal Vaccine for age over 26  Completed   Flu Shot  Completed   Hepatitis C Screening  Completed   Zoster (Shingles) Vaccine  Completed   Meningitis B Vaccine  Aged Out       06/18/2024   12:28 PM  Advanced Directives  Does Patient Have a Medical Advance Directive? No  Would patient like information on creating a medical advance directive? No - Patient declined    Vision: Annual vision screenings are recommended for early detection of glaucoma, cataracts, and diabetic retinopathy. These exams can also reveal signs of chronic conditions such as diabetes and high blood pressure.  Dental:  Annual dental screenings help detect early signs of oral cancer, gum disease, and other conditions linked to overall health, including heart disease and diabetes.  Please see the attached documents for additional preventive care recommendations.

## 2024-06-19 NOTE — Progress Notes (Cosign Needed Addendum)
 "  Chief Complaint  Patient presents with   Medicare Wellness     Subjective:   BLAKELYNN SCHEELER is a 79 y.o. female who presents for a Medicare Annual Wellness Visit.  Visit info / Clinical Intake: Medicare Wellness Visit Type:: Subsequent Annual Wellness Visit Persons participating in visit and providing information:: patient Medicare Wellness Visit Mode:: Telephone If telephone:: video declined Since this visit was completed virtually, some vitals may be partially provided or unavailable. Missing vitals are due to the limitations of the virtual format.: Unable to obtain vitals - no equipment If Telephone or Video please confirm:: I connected with patient using audio/video enable telemedicine. I verified patient identity with two identifiers, discussed telehealth limitations, and patient agreed to proceed. Patient Location:: home Provider Location:: office Interpreter Needed?: No Pre-visit prep was completed: yes AWV questionnaire completed by patient prior to visit?: no Living arrangements:: lives with spouse/significant other Patient's Overall Health Status Rating: very good Typical amount of pain: some (pt has arthritis in hands/legs) Does pain affect daily life?: no Are you currently prescribed opioids?: no  Dietary Habits and Nutritional Risks How many meals a day?: 3 Eats fruit and vegetables daily?: yes Most meals are obtained by: preparing own meals In the last 2 weeks, have you had any of the following?: none Diabetic:: (!) yes Any non-healing wounds?: no How often do you check your BS?: continuous glucose monitor Would you like to be referred to a Nutritionist or for Diabetic Management? : no  Functional Status Activities of Daily Living (to include ambulation/medication): Independent Ambulation: Independent with device- listed below Home Assistive Devices/Equipment: Johna Finder (specify Type) Medication Administration: Needs assistance (comment) (pt's  husband) Home Management (perform basic housework or laundry): Needs assistance (comment) (pt's husband) Manage your own finances?: yes (w/pt's husband) Primary transportation is: family / friends Concerns about vision?: no *vision screening is required for WT[facility]* (upcoming 2/26) Concerns about hearing?: no  Fall Screening Falls in the past year?: 1 Number of falls in past year: 1 Was there an injury with Fall?: 1 Fall Risk Category Calculator: 3 Patient Fall Risk Level: High Fall Risk  Fall Risk Patient at Risk for Falls Due to: Impaired mobility; Impaired balance/gait Fall risk Follow up: Falls evaluation completed; Education provided  Home and Transportation Safety: All rugs have non-skid backing?: yes All stairs or steps have railings?: yes Have non-skid surface in bathtub or shower?: yes Good home lighting?: yes Regular seat belt use?: yes Hospital stays in the last year:: (!) yes How many hospital stays:: 3 Reason: fracture back due fall  Cognitive Assessment Difficulty concentrating, remembering, or making decisions? : no Will 6CIT or Mini Cog be Completed: yes What year is it?: 0 points What month is it?: 0 points Give patient an address phrase to remember (5 components): [address] [place] About what time is it?: 0 points Count backwards from 20 to 1: 0 points Say the months of the year in reverse: 0 points Repeat the address phrase from earlier: 0 points 6 CIT Score: 0 points  Advance Directives (For Healthcare) Does Patient Have a Medical Advance Directive?: No Would patient like information on creating a medical advance directive?: No - Patient declined  Reviewed/Updated  Reviewed/Updated: Reviewed All (Medical, Surgical, Family, Medications, Allergies, Care Teams, Patient Goals)    Allergies (verified) Aspirin, Ciprofloxacin, Codeine, Hydrocodone -acetaminophen , Losartan , Micardis [telmisartan], Nexium [esomeprazole magnesium ], Niaspan [niacin  er (antihyperlipidemic)], Nsaids, Onglyza [saxagliptin ], Other, Oxycodone  hcl, Rofecoxib, Simvastatin, Tequin [gatifloxacin], Welchol [colesevelam hcl], and Amoxicillin  Current Medications (verified) Outpatient Encounter Medications as of 06/18/2024  Medication Sig   acetaminophen  (TYLENOL ) 325 MG tablet Take 2 tablets (650 mg total) by mouth every 6 (six) hours as needed for mild pain (pain score 1-3) or fever (or Fever >/= 101).   amLODipine  (NORVASC ) 5 MG tablet Take 1 tablet (5 mg total) by mouth daily.   atorvastatin  (LIPITOR) 10 MG tablet Take 1 tablet (10 mg total) by mouth daily.   carvedilol  (COREG ) 6.25 MG tablet Take 1 tablet (6.25 mg total) by mouth 2 (two) times daily with a meal.   Continuous Glucose Receiver (FREESTYLE LIBRE 3 READER) DEVI Use to monitor glucose continuously as instructed.   denosumab -bbdz (JUBBONTI ) 60 MG/ML SOSY injection Inject 60 mg into the skin every 6 (six) months.   insulin  aspart (NOVOLOG ) 100 UNIT/ML FlexPen Inject 0-10 Units into the skin 3 (three) times daily with meals. Short acting insulin  per sliding scale 0-10 ---:-- Insulin  injection 0-10 Units 0-10 Units Subcutaneous, 3 times daily with meals CBG 70 - 120: 0 unit CBG 121 - 150: 0 unit  CBG 151 - 200: 1 unit CBG 201 - 250: 2 units CBG 251 - 300: 4 units CBG 301 - 350: 6 units  CBG 351 - 400: 8 units  CBG > 400: 10 units   Insulin  Glargine (BASAGLAR  KWIKPEN) 100 UNIT/ML Inject 8 Units into the skin at bedtime.   ketorolac  (ACULAR ) 0.5 % ophthalmic solution Place 1 drop into the right eye 2 (two) times daily.   levothyroxine  (SYNTHROID ) 50 MCG tablet TAKE ONE TABLET ONCE DAILY BEFORE BREAKFAST   losartan  (COZAAR ) 25 MG tablet Take 1 tablet (25 mg total) by mouth daily. **dose reduction   senna-docusate (SENOKOT-S) 8.6-50 MG tablet Take 2 tablets by mouth at bedtime.   sodium bicarbonate  650 MG tablet Take 1 tablet (650 mg total) by mouth 2 (two) times daily.   traZODone  (DESYREL ) 50 MG tablet Take 1  tablet (50 mg total) by mouth at bedtime as needed for sleep.   HYDROcodone -acetaminophen  (NORCO/VICODIN) 5-325 MG tablet Take 1 tablet by mouth every 8 (eight) hours as needed. (Patient not taking: Reported on 06/18/2024)   No facility-administered encounter medications on file as of 06/18/2024.    History: Past Medical History:  Diagnosis Date   Anal fistula    Anemia in chronic renal disease    Aranesp  injection --  when Hg <11, last injection 12-24-14.   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    CKD (chronic kidney disease), stage III [facility])    nephrologist--  dr froylan-- LOV  07-09-2016   Complication of anesthesia    post-op confusion    Constipation    Coronary artery disease    Diabetic gastroparesis (HCC)    Diabetic retinopathy (HCC)    bilateral --  monitored by dr elner   Diverticulosis of colon    GAD (generalized anxiety disorder)    GERD (gastroesophageal reflux disease)    Hip fracture requiring operative repair (HCC)    History of colon polyps    benign   History of esophagitis    History of GI bleed    upper  2009  due to esophagitis  &  2002  due to Mallory-Weiss tear   History of hyperkalemia    pt had previously been canceled twice dos due to elevated K+, 07-29-2016 last date cancelled -- pt visited pcp same day treated w/ kayexelate and K+ came down, pt brought all her medication's in to pcp office and found pt was taking losartan  that had been discontinued due to ckd, pcp stated this was cause of elevated K+   History of Mallory-Weiss syndrome    12/ 2002--  resolved   History of rectal abscess    12-29-2004  bedside I & D   Hyperlipidemia     Hypertension    Hypothyroidism    LAFB (left anterior fascicular block)    Right bundle branch block    Sacral decubitus ulcer    since 2014- 01-02-15 remains with wound gauze dressing changes daily.   Type II diabetes mellitus (HCC)    Past Surgical History:  Procedure Laterality Date   APPLICATION OF A-CELL OF EXTREMITY N/A 04/07/2015   Procedure: A CELL PLACMENT;  Surgeon: Estefana RAMAN Dillingham, DO;  Location: [facility] OR;  Service: Plastics;  Laterality: N/A;   CARDIOVASCULAR STRESS TEST  12/30/2004   normal perfusion study/  no ischemia or infartion/  normal LV wall function and wall motion , ef 66%   CATARACT EXTRACTION W/ INTRAOCULAR LENS  IMPLANT, BILATERAL  1995   COLONOSCOPY  2008   w/Dr.Brodie    COLONOSCOPY W/ POLYPECTOMY  last one 2008   COMPRESSION HIP SCREW Right 05/18/2014   Procedure: COMPRESSION HIP;  Surgeon: Taft FORBES Minerva, MD;  Location: [facility];  Service: Orthopedics;  Laterality: Right;   ESOPHAGOGASTRODUODENOSCOPY  last one 01-09-2011   EVALUATION UNDER ANESTHESIA WITH FISTULECTOMY N/A 01/06/2015   Procedure: EXAM UNDER ANESTHESIA , placement of seton;  Surgeon: Krystal Russell, MD;  Location: [facility];  Service: General;  Laterality: N/A;   I & D EXTREMITY N/A 04/07/2015   Procedure: IRRIGATION AND DEBRIDEMENT ISCHIAL ULCER;  Surgeon: Estefana RAMAN Dillingham, DO;  Location: [facility] OR;  Service: Plastics;  Laterality: N/A;   INCISION AND DRAINAGE OF WOUND N/A 09/30/2014   Procedure: IRRIGATION AND DEBRIDEMENT SACRAL WOUND, EXCISION OF PERIRECTAL TRACT WITH PLACEMENT OF ACCELL;  Surgeon: Estefana Reichert, DO;  Location: [facility];  Service: Plastics;  Laterality: N/A;   LIGATION OF INTERNAL FISTULA TRACT N/A 10/22/2016   Procedure: LIGATION OF INTERNAL FISTULA TRACT;  Surgeon: Debby Hila, MD;  Location: [facility];  Service: General;  Laterality: N/A;   ORIF FEMUR FRACTURE Left 10/09/2012   Procedure: OPEN REDUCTION INTERNAL FIXATION (ORIF)  DISTAL FEMUR FRACTURE;  Surgeon: Ozell VEAR Bruch, MD;  Location: [facility] OR;  Service: Orthopedics;  Laterality: Left;   RETINOPATHY SURGERY Bilateral 1980's?   SKIN CANCER EXCISION  03/2023   TRANSTHORACIC ECHOCARDIOGRAM  02/18/2011   mild LVH,  ef 55-60%,  grade I diastolic dysfunction/  mild TR/  RV systolic pressure increased consistant with moderate pulmonary hypertension   Family History  Problem Relation Age of Onset   Coronary artery disease Mother 33   Heart disease Mother    Diabetes Mother    Colon cancer Father 40   Prostate cancer Father    Diabetes Father    Coronary artery disease Sister 96   Diabetes Sister    Heart disease Sister    Diabetes Sister    Diabetes Sister    Diabetes Sister    Diabetes Sister  Diabetes Sister    Diabetes Maternal Grandmother    Esophageal cancer Neg Hx    Stomach cancer Neg Hx    Rectal cancer Neg Hx    Social History   Occupational History   Occupation: Retired    Associate Professor: RETIRED  Tobacco Use   Smoking status: Never   Smokeless tobacco: Never  Vaping Use   Vaping status: Never Used  Substance and Sexual Activity   Alcohol use: No   Drug use: No   Sexual activity: Not Currently   Tobacco Counseling Counseling given: Yes  SDOH Screenings   Food Insecurity: Food Insecurity Present (06/18/2024)  Housing: Low Risk (06/18/2024)  Transportation Needs: No Transportation Needs (06/18/2024)  Utilities: Not At Risk (06/18/2024)  Alcohol Screen: Low Risk (05/27/2023)  Depression (PHQ2-9): Low Risk (06/18/2024)  Financial Resource Strain: Low Risk (05/27/2023)  Physical Activity: Insufficiently Active (06/18/2024)  Social Connections: Moderately Integrated (06/18/2024)  Stress: No Stress Concern Present (06/18/2024)  Tobacco Use: Low Risk (06/18/2024)  Health Literacy: Adequate Health Literacy (06/18/2024)   See flowsheets for full screening details  Depression Screen PHQ 2 & 9 Depression Scale- Over the past 2 weeks, how often have  you been bothered by any of the following problems? Little interest or pleasure in doing things: 0 Feeling down, depressed, or hopeless (PHQ Adolescent also includes...irritable): 0 PHQ-2 Total Score: 0 Trouble falling or staying asleep, or sleeping too much: 0 Feeling tired or having little energy: 0 Poor appetite or overeating (PHQ Adolescent also includes...weight loss): 0 Feeling bad about yourself - or that you are a failure or have let yourself or your family down: 0 Trouble concentrating on things, such as reading the newspaper or watching television (PHQ Adolescent also includes...like school work): 0 Moving or speaking so slowly that other people could have noticed. Or the opposite - being so fidgety or restless that you have been moving around a lot more than usual: 0 Thoughts that you would be better off dead, or of hurting yourself in some way: 0 PHQ-9 Total Score: 0 If you checked off any problems, how difficult have these problems made it for you to do your work, take care of things at home, or get along with other people?: Not difficult at all     Goals Addressed   None          Objective:    Today's Vitals   06/18/24 1244  BP: (!) 100/40  Pulse: 60  Weight: 138 lb (62.6 kg)  Height: 5' 1 (1.549 m)   Body mass index is 26.07 kg/m.  Hearing/Vision screen No results found. Immunizations and Health Maintenance Health Maintenance  Topic Date Due   COVID-19 Vaccine (7 - 2025-26 season) 01/23/2024   HEMOGLOBIN A1C  08/22/2024   Diabetic kidney evaluation - Urine ACR  08/29/2024   OPHTHALMOLOGY EXAM  12/25/2024   Diabetic kidney evaluation - eGFR measurement  02/23/2025   FOOT EXAM  03/23/2025   Medicare Annual Wellness (AWV)  06/18/2025   Bone Density Scan  07/20/2025   DTaP/Tdap/Td (3 - Td or Tdap) 03/02/2033   Pneumococcal Vaccine: 50+ Years  Completed   Influenza Vaccine  Completed   Hepatitis C Screening  Completed   Zoster Vaccines- Shingrix   Completed   Meningococcal B Vaccine  Aged Out   Colonoscopy  Discontinued        Assessment/Plan:  This is a routine wellness examination for [place].  Patient Care Team: Jolinda Norene HERO, DO as PCP - General (  Family Medicine) Elner Arley LABOR, MD as Consulting Physician (Ophthalmology) Froylan Sharper, MD as Consulting Physician (Nephrology) Lavona Agent, MD as Consulting Physician (Cardiology) Obie Princella HERO, MD (Inactive) as Consulting Physician (Gastroenterology) Frances Sharper RAMAN, LCSW as [facility] Care Management (Licensed Clinical Social Worker) Billee, Mliss BIRCH, RPH-CPP (Pharmacist) Lenis Ethelle ORN, MD as Consulting Physician (Endocrinology) Prescilla Beams, MD as Consulting Physician (Nephrology) Roddie Bring, DPM as Consulting Physician (Podiatry) Eda Baxter, MD as Referring Physician (Dermatology)  I have personally reviewed and noted the following in the patients chart:   Medical and social history Use of alcohol, tobacco or illicit drugs  Current medications and supplements including opioid prescriptions. Functional ability and status Nutritional status Physical activity Advanced directives List of other physicians Hospitalizations, surgeries, and ER visits in previous 12 months Vitals Screenings to include cognitive, depression, and falls Referrals and appointments  No orders of the defined types were placed in this encounter.  In addition, I have reviewed and discussed with patient certain preventive protocols, quality metrics, and best practice recommendations. A written personalized care plan for preventive services as well as general preventive health recommendations were provided to patient.   Ozie Ned, CMA   06/19/2024   Return in 1 year (on 06/18/2025).  After Visit Summary: (MyChart) Due to this being a telephonic visit, the after visit summary with patients personalized plan was offered to patient via MyChart    Nurse Notes: Per pt aged out of Colonoscopy--don't have to do anymore, pt declined Covid vaccines "

## 2024-06-26 ENCOUNTER — Other Ambulatory Visit: Payer: Self-pay | Admitting: "Endocrinology

## 2024-10-10 ENCOUNTER — Ambulatory Visit: Admitting: "Endocrinology

## 2024-10-12 ENCOUNTER — Encounter: Admitting: Family Medicine

## 2024-10-24 ENCOUNTER — Ambulatory Visit: Admitting: "Endocrinology

## 2025-06-19 ENCOUNTER — Ambulatory Visit
